# Patient Record
Sex: Female | Born: 1935
Health system: Southern US, Community
[De-identification: ages and names within clinical notes are randomized; demographics above are authoritative.]

## PROBLEM LIST (undated history)

## (undated) DIAGNOSIS — N390 Urinary tract infection, site not specified: Secondary | ICD-10-CM

## (undated) DIAGNOSIS — E559 Vitamin D deficiency, unspecified: Secondary | ICD-10-CM

## (undated) DIAGNOSIS — G40802 Other epilepsy, not intractable, without status epilepticus: Secondary | ICD-10-CM

## (undated) DIAGNOSIS — E1140 Type 2 diabetes mellitus with diabetic neuropathy, unspecified: Secondary | ICD-10-CM

## (undated) DIAGNOSIS — E7800 Pure hypercholesterolemia, unspecified: Secondary | ICD-10-CM

## (undated) DIAGNOSIS — H8109 Meniere's disease, unspecified ear: Secondary | ICD-10-CM

## (undated) DIAGNOSIS — C4490 Unspecified malignant neoplasm of skin, unspecified: Secondary | ICD-10-CM

## (undated) DIAGNOSIS — D649 Anemia, unspecified: Secondary | ICD-10-CM

## (undated) DIAGNOSIS — I10 Essential (primary) hypertension: Secondary | ICD-10-CM

## (undated) DIAGNOSIS — E119 Type 2 diabetes mellitus without complications: Secondary | ICD-10-CM

## (undated) DIAGNOSIS — M109 Gout, unspecified: Secondary | ICD-10-CM

## (undated) DIAGNOSIS — E669 Obesity, unspecified: Secondary | ICD-10-CM

## (undated) DIAGNOSIS — J189 Pneumonia, unspecified organism: Secondary | ICD-10-CM

## (undated) DIAGNOSIS — C4491 Basal cell carcinoma of skin, unspecified: Secondary | ICD-10-CM

## (undated) DIAGNOSIS — M1990 Unspecified osteoarthritis, unspecified site: Secondary | ICD-10-CM

## (undated) DIAGNOSIS — K219 Gastro-esophageal reflux disease without esophagitis: Secondary | ICD-10-CM

## (undated) DIAGNOSIS — E039 Hypothyroidism, unspecified: Secondary | ICD-10-CM

## (undated) DIAGNOSIS — N184 Chronic kidney disease, stage 4 (severe): Secondary | ICD-10-CM

## (undated) DIAGNOSIS — G40909 Epilepsy, unspecified, not intractable, without status epilepticus: Secondary | ICD-10-CM

## (undated) HISTORY — DX: Other epilepsy, not intractable, without status epilepticus: G40.802

## (undated) HISTORY — DX: Vitamin D deficiency, unspecified: E55.9

## (undated) HISTORY — PX: CERVICAL POLYPECTOMY: SHX88

## (undated) HISTORY — DX: Pure hypercholesterolemia, unspecified: E78.00

## (undated) HISTORY — DX: Essential (primary) hypertension: I10

## (undated) HISTORY — DX: Gout, unspecified: M10.9

## (undated) HISTORY — DX: Gastro-esophageal reflux disease without esophagitis: K21.9

## (undated) HISTORY — PX: TONSILLECTOMY: SUR1361

## (undated) HISTORY — DX: Hypothyroidism, unspecified: E03.9

## (undated) HISTORY — DX: Meniere's disease, unspecified ear: H81.09

## (undated) HISTORY — DX: Type 2 diabetes mellitus without complications: E11.9

## (undated) HISTORY — DX: Type 2 diabetes mellitus with diabetic neuropathy, unspecified: E11.40

## (undated) HISTORY — DX: Urinary tract infection, site not specified: N39.0

## (undated) HISTORY — DX: Anemia, unspecified: D64.9

## (undated) HISTORY — PX: WRIST SURGERY: SHX841

## (undated) HISTORY — PX: MOHS SURGERY: SHX181

## (undated) HISTORY — DX: Obesity, unspecified: E66.9

---

## 1978-10-20 HISTORY — PX: HAMMER TOE SURGERY: SHX385

## 1997-12-05 ENCOUNTER — Ambulatory Visit (HOSPITAL_COMMUNITY): Admission: RE | Admit: 1997-12-05 | Discharge: 1997-12-05 | Payer: Self-pay | Admitting: Internal Medicine

## 1998-12-08 ENCOUNTER — Ambulatory Visit (HOSPITAL_COMMUNITY): Admission: RE | Admit: 1998-12-08 | Discharge: 1998-12-08 | Payer: Self-pay | Admitting: Internal Medicine

## 1998-12-08 ENCOUNTER — Encounter: Payer: Self-pay | Admitting: Internal Medicine

## 1999-12-13 ENCOUNTER — Ambulatory Visit (HOSPITAL_COMMUNITY): Admission: RE | Admit: 1999-12-13 | Discharge: 1999-12-13 | Payer: Self-pay | Admitting: Internal Medicine

## 1999-12-13 ENCOUNTER — Encounter: Payer: Self-pay | Admitting: Internal Medicine

## 1999-12-26 ENCOUNTER — Encounter: Payer: Self-pay | Admitting: Internal Medicine

## 1999-12-26 ENCOUNTER — Ambulatory Visit (HOSPITAL_COMMUNITY): Admission: RE | Admit: 1999-12-26 | Discharge: 1999-12-26 | Payer: Self-pay | Admitting: Internal Medicine

## 2000-03-13 ENCOUNTER — Other Ambulatory Visit: Admission: RE | Admit: 2000-03-13 | Discharge: 2000-03-13 | Payer: Self-pay | Admitting: Internal Medicine

## 2000-03-13 ENCOUNTER — Encounter (INDEPENDENT_AMBULATORY_CARE_PROVIDER_SITE_OTHER): Payer: Self-pay | Admitting: Specialist

## 2000-12-15 ENCOUNTER — Encounter: Payer: Self-pay | Admitting: Internal Medicine

## 2000-12-15 ENCOUNTER — Ambulatory Visit (HOSPITAL_COMMUNITY): Admission: RE | Admit: 2000-12-15 | Discharge: 2000-12-15 | Payer: Self-pay | Admitting: Internal Medicine

## 2002-01-04 ENCOUNTER — Encounter: Payer: Self-pay | Admitting: Internal Medicine

## 2002-01-04 ENCOUNTER — Ambulatory Visit (HOSPITAL_COMMUNITY): Admission: RE | Admit: 2002-01-04 | Discharge: 2002-01-04 | Payer: Self-pay | Admitting: Internal Medicine

## 2002-02-09 ENCOUNTER — Ambulatory Visit (HOSPITAL_COMMUNITY): Admission: RE | Admit: 2002-02-09 | Discharge: 2002-02-09 | Payer: Self-pay | Admitting: Internal Medicine

## 2002-02-09 ENCOUNTER — Encounter (INDEPENDENT_AMBULATORY_CARE_PROVIDER_SITE_OTHER): Payer: Self-pay | Admitting: Specialist

## 2002-02-09 ENCOUNTER — Encounter (INDEPENDENT_AMBULATORY_CARE_PROVIDER_SITE_OTHER): Payer: Self-pay | Admitting: *Deleted

## 2002-02-09 ENCOUNTER — Encounter: Payer: Self-pay | Admitting: Internal Medicine

## 2003-01-19 ENCOUNTER — Ambulatory Visit (HOSPITAL_COMMUNITY): Admission: RE | Admit: 2003-01-19 | Discharge: 2003-01-19 | Payer: Self-pay | Admitting: Internal Medicine

## 2003-01-31 ENCOUNTER — Other Ambulatory Visit: Admission: RE | Admit: 2003-01-31 | Discharge: 2003-01-31 | Payer: Self-pay | Admitting: Internal Medicine

## 2003-08-08 ENCOUNTER — Ambulatory Visit (HOSPITAL_COMMUNITY): Admission: RE | Admit: 2003-08-08 | Discharge: 2003-08-08 | Payer: Self-pay | Admitting: Internal Medicine

## 2004-02-17 ENCOUNTER — Ambulatory Visit (HOSPITAL_COMMUNITY): Admission: RE | Admit: 2004-02-17 | Discharge: 2004-02-17 | Payer: Self-pay | Admitting: Internal Medicine

## 2005-01-18 ENCOUNTER — Ambulatory Visit: Payer: Self-pay | Admitting: Internal Medicine

## 2005-01-29 ENCOUNTER — Ambulatory Visit: Payer: Self-pay | Admitting: Internal Medicine

## 2005-01-29 ENCOUNTER — Encounter (INDEPENDENT_AMBULATORY_CARE_PROVIDER_SITE_OTHER): Payer: Self-pay | Admitting: *Deleted

## 2005-03-13 ENCOUNTER — Ambulatory Visit (HOSPITAL_COMMUNITY): Admission: RE | Admit: 2005-03-13 | Discharge: 2005-03-13 | Payer: Self-pay | Admitting: Internal Medicine

## 2006-03-14 ENCOUNTER — Ambulatory Visit (HOSPITAL_COMMUNITY): Admission: RE | Admit: 2006-03-14 | Discharge: 2006-03-14 | Payer: Self-pay | Admitting: Internal Medicine

## 2007-01-03 ENCOUNTER — Emergency Department (HOSPITAL_COMMUNITY): Admission: EM | Admit: 2007-01-03 | Discharge: 2007-01-03 | Payer: Self-pay | Admitting: Emergency Medicine

## 2007-03-17 ENCOUNTER — Ambulatory Visit (HOSPITAL_COMMUNITY): Admission: RE | Admit: 2007-03-17 | Discharge: 2007-03-17 | Payer: Self-pay | Admitting: Internal Medicine

## 2007-03-18 ENCOUNTER — Other Ambulatory Visit: Admission: RE | Admit: 2007-03-18 | Discharge: 2007-03-18 | Payer: Self-pay | Admitting: Internal Medicine

## 2007-10-09 ENCOUNTER — Encounter: Admission: RE | Admit: 2007-10-09 | Discharge: 2007-10-09 | Payer: Self-pay | Admitting: Otolaryngology

## 2008-02-02 ENCOUNTER — Encounter (INDEPENDENT_AMBULATORY_CARE_PROVIDER_SITE_OTHER): Payer: Self-pay | Admitting: Internal Medicine

## 2008-02-02 ENCOUNTER — Ambulatory Visit: Payer: Self-pay

## 2008-03-17 ENCOUNTER — Ambulatory Visit (HOSPITAL_COMMUNITY): Admission: RE | Admit: 2008-03-17 | Discharge: 2008-03-17 | Payer: Self-pay | Admitting: Internal Medicine

## 2008-05-26 ENCOUNTER — Ambulatory Visit: Payer: Self-pay | Admitting: Internal Medicine

## 2008-05-26 ENCOUNTER — Inpatient Hospital Stay (HOSPITAL_COMMUNITY): Admission: EM | Admit: 2008-05-26 | Discharge: 2008-05-29 | Payer: Self-pay | Admitting: Emergency Medicine

## 2008-05-26 ENCOUNTER — Encounter (INDEPENDENT_AMBULATORY_CARE_PROVIDER_SITE_OTHER): Payer: Self-pay | Admitting: *Deleted

## 2008-05-27 ENCOUNTER — Encounter (INDEPENDENT_AMBULATORY_CARE_PROVIDER_SITE_OTHER): Payer: Self-pay | Admitting: Internal Medicine

## 2008-05-27 ENCOUNTER — Encounter (INDEPENDENT_AMBULATORY_CARE_PROVIDER_SITE_OTHER): Payer: Self-pay | Admitting: *Deleted

## 2008-05-29 ENCOUNTER — Encounter (INDEPENDENT_AMBULATORY_CARE_PROVIDER_SITE_OTHER): Payer: Self-pay | Admitting: *Deleted

## 2008-06-07 ENCOUNTER — Telehealth: Payer: Self-pay | Admitting: Internal Medicine

## 2008-06-09 DIAGNOSIS — E039 Hypothyroidism, unspecified: Secondary | ICD-10-CM | POA: Insufficient documentation

## 2008-06-09 DIAGNOSIS — K219 Gastro-esophageal reflux disease without esophagitis: Secondary | ICD-10-CM | POA: Insufficient documentation

## 2008-06-09 DIAGNOSIS — E1122 Type 2 diabetes mellitus with diabetic chronic kidney disease: Secondary | ICD-10-CM | POA: Insufficient documentation

## 2008-06-09 DIAGNOSIS — I10 Essential (primary) hypertension: Secondary | ICD-10-CM | POA: Insufficient documentation

## 2008-06-09 DIAGNOSIS — I872 Venous insufficiency (chronic) (peripheral): Secondary | ICD-10-CM | POA: Insufficient documentation

## 2008-06-09 DIAGNOSIS — N184 Chronic kidney disease, stage 4 (severe): Secondary | ICD-10-CM | POA: Insufficient documentation

## 2008-06-09 DIAGNOSIS — D126 Benign neoplasm of colon, unspecified: Secondary | ICD-10-CM | POA: Insufficient documentation

## 2008-06-13 ENCOUNTER — Ambulatory Visit: Payer: Self-pay | Admitting: Internal Medicine

## 2008-06-13 DIAGNOSIS — D509 Iron deficiency anemia, unspecified: Secondary | ICD-10-CM

## 2008-06-13 DIAGNOSIS — D649 Anemia, unspecified: Secondary | ICD-10-CM | POA: Insufficient documentation

## 2008-07-07 ENCOUNTER — Telehealth: Payer: Self-pay | Admitting: Internal Medicine

## 2008-08-01 ENCOUNTER — Ambulatory Visit: Payer: Self-pay | Admitting: Internal Medicine

## 2008-08-01 ENCOUNTER — Encounter: Payer: Self-pay | Admitting: Internal Medicine

## 2008-08-01 LAB — HM COLONOSCOPY

## 2008-08-02 ENCOUNTER — Encounter: Payer: Self-pay | Admitting: Internal Medicine

## 2008-09-25 ENCOUNTER — Encounter: Payer: Self-pay | Admitting: Internal Medicine

## 2008-09-29 ENCOUNTER — Ambulatory Visit (HOSPITAL_COMMUNITY): Admission: RE | Admit: 2008-09-29 | Discharge: 2008-09-29 | Payer: Self-pay | Admitting: Internal Medicine

## 2008-11-07 ENCOUNTER — Telehealth: Payer: Self-pay | Admitting: Internal Medicine

## 2008-12-14 ENCOUNTER — Observation Stay (HOSPITAL_COMMUNITY): Admission: EM | Admit: 2008-12-14 | Discharge: 2008-12-15 | Payer: Self-pay | Admitting: Emergency Medicine

## 2008-12-26 ENCOUNTER — Encounter: Payer: Self-pay | Admitting: Internal Medicine

## 2009-01-23 ENCOUNTER — Ambulatory Visit: Payer: Self-pay | Admitting: Internal Medicine

## 2009-01-23 LAB — CONVERTED CEMR LAB
Ferritin: 61.1 ng/mL (ref 10.0–291.0)
Folate: 10.3 ng/mL
Iron: 86 ug/dL (ref 42–145)
Saturation Ratios: 18.8 % — ABNORMAL LOW (ref 20.0–50.0)
Transferrin: 326.8 mg/dL (ref 212.0–360.0)
Vitamin B-12: 774 pg/mL (ref 211–911)

## 2009-02-08 ENCOUNTER — Ambulatory Visit: Payer: Self-pay | Admitting: Oncology

## 2009-02-09 ENCOUNTER — Encounter: Payer: Self-pay | Admitting: Internal Medicine

## 2009-02-13 ENCOUNTER — Encounter: Payer: Self-pay | Admitting: Internal Medicine

## 2009-02-13 LAB — CBC & DIFF AND RETIC
BASO%: 0.3 % (ref 0.0–2.0)
Basophils Absolute: 0 10*3/uL (ref 0.0–0.1)
EOS%: 4.3 % (ref 0.0–7.0)
Eosinophils Absolute: 0.2 10*3/uL (ref 0.0–0.5)
HCT: 34.9 % (ref 34.8–46.6)
HGB: 11.3 g/dL — ABNORMAL LOW (ref 11.6–15.9)
Immature Retic Fract: 5.8 % (ref 0.00–10.70)
LYMPH%: 32.3 % (ref 14.0–49.7)
MCH: 29.3 pg (ref 25.1–34.0)
MCHC: 32.4 g/dL (ref 31.5–36.0)
MCV: 90.4 fL (ref 79.5–101.0)
MONO#: 0.3 10*3/uL (ref 0.1–0.9)
MONO%: 9.2 % (ref 0.0–14.0)
NEUT#: 1.9 10*3/uL (ref 1.5–6.5)
NEUT%: 53.9 % (ref 38.4–76.8)
Platelets: 178 10*3/uL (ref 145–400)
RBC: 3.86 10*6/uL (ref 3.70–5.45)
RDW: 13.4 % (ref 11.2–14.5)
Retic %: 1.21 % (ref 0.50–1.50)
Retic Ct Abs: 46.71 10*3/uL (ref 18.30–72.70)
WBC: 3.5 10*3/uL — ABNORMAL LOW (ref 3.9–10.3)
lymph#: 1.1 10*3/uL (ref 0.9–3.3)

## 2009-02-13 LAB — MORPHOLOGY: PLT EST: ADEQUATE

## 2009-02-15 LAB — IRON AND TIBC
%SAT: 22 % (ref 20–55)
Iron: 85 ug/dL (ref 42–145)
TIBC: 380 ug/dL (ref 250–470)
UIBC: 295 ug/dL

## 2009-02-15 LAB — COMPREHENSIVE METABOLIC PANEL
ALT: 30 U/L (ref 0–35)
AST: 20 U/L (ref 0–37)
Albumin: 4.2 g/dL (ref 3.5–5.2)
Alkaline Phosphatase: 34 U/L — ABNORMAL LOW (ref 39–117)
BUN: 24 mg/dL — ABNORMAL HIGH (ref 6–23)
CO2: 20 mEq/L (ref 19–32)
Calcium: 10 mg/dL (ref 8.4–10.5)
Chloride: 106 mEq/L (ref 96–112)
Creatinine, Ser: 1.94 mg/dL — ABNORMAL HIGH (ref 0.40–1.20)
Glucose, Bld: 231 mg/dL — ABNORMAL HIGH (ref 70–99)
Potassium: 4.3 mEq/L (ref 3.5–5.3)
Sodium: 140 mEq/L (ref 135–145)
Total Bilirubin: 0.3 mg/dL (ref 0.3–1.2)
Total Protein: 6.4 g/dL (ref 6.0–8.3)

## 2009-02-15 LAB — PROTEIN ELECTROPHORESIS, SERUM
Albumin ELP: 63.7 % (ref 55.8–66.1)
Alpha-1-Globulin: 4.5 % (ref 2.9–4.9)
Alpha-2-Globulin: 9.6 % (ref 7.1–11.8)
Beta 2: 4.5 % (ref 3.2–6.5)
Beta Globulin: 7.2 % (ref 4.7–7.2)
Gamma Globulin: 10.5 % — ABNORMAL LOW (ref 11.1–18.8)
Total Protein, Serum Electrophoresis: 6.4 g/dL (ref 6.0–8.3)

## 2009-02-15 LAB — ERYTHROPOIETIN: Erythropoietin: 18.5 m[IU]/mL (ref 2.6–34.0)

## 2009-02-15 LAB — VITAMIN B12: Vitamin B-12: 620 pg/mL (ref 211–911)

## 2009-02-15 LAB — FOLATE: Folate: 16.4 ng/mL

## 2009-02-15 LAB — HAPTOGLOBIN: Haptoglobin: 71 mg/dL (ref 16–200)

## 2009-02-15 LAB — FERRITIN: Ferritin: 68 ng/mL (ref 10–291)

## 2009-02-15 LAB — DIRECT ANTIGLOBULIN TEST (NOT AT ARMC)
DAT (Complement): NEGATIVE
DAT IgG: NEGATIVE

## 2009-02-27 ENCOUNTER — Encounter: Payer: Self-pay | Admitting: Internal Medicine

## 2009-02-27 LAB — CBC WITH DIFFERENTIAL/PLATELET
BASO%: 0.4 % (ref 0.0–2.0)
Basophils Absolute: 0 10*3/uL (ref 0.0–0.1)
EOS%: 4.5 % (ref 0.0–7.0)
Eosinophils Absolute: 0.2 10*3/uL (ref 0.0–0.5)
HCT: 37.4 % (ref 34.8–46.6)
HGB: 12.3 g/dL (ref 11.6–15.9)
LYMPH%: 27.1 % (ref 14.0–49.7)
MCH: 29.6 pg (ref 25.1–34.0)
MCHC: 32.9 g/dL (ref 31.5–36.0)
MCV: 89.9 fL (ref 79.5–101.0)
MONO#: 0.5 10*3/uL (ref 0.1–0.9)
MONO%: 8.9 % (ref 0.0–14.0)
NEUT#: 3.1 10*3/uL (ref 1.5–6.5)
NEUT%: 59.1 % (ref 38.4–76.8)
Platelets: 218 10*3/uL (ref 145–400)
RBC: 4.16 10*6/uL (ref 3.70–5.45)
RDW: 13.4 % (ref 11.2–14.5)
WBC: 5.3 10*3/uL (ref 3.9–10.3)
lymph#: 1.4 10*3/uL (ref 0.9–3.3)

## 2009-03-10 ENCOUNTER — Telehealth: Payer: Self-pay | Admitting: Internal Medicine

## 2009-03-17 ENCOUNTER — Ambulatory Visit: Payer: Self-pay | Admitting: Oncology

## 2009-04-27 ENCOUNTER — Telehealth: Payer: Self-pay | Admitting: Internal Medicine

## 2009-05-09 ENCOUNTER — Ambulatory Visit (HOSPITAL_COMMUNITY): Admission: RE | Admit: 2009-05-09 | Discharge: 2009-05-09 | Payer: Self-pay | Admitting: Internal Medicine

## 2009-09-14 ENCOUNTER — Ambulatory Visit: Payer: Self-pay | Admitting: Oncology

## 2009-09-18 LAB — CBC WITH DIFFERENTIAL/PLATELET
BASO%: 0.6 % (ref 0.0–2.0)
Basophils Absolute: 0 10*3/uL (ref 0.0–0.1)
EOS%: 6.3 % (ref 0.0–7.0)
Eosinophils Absolute: 0.3 10*3/uL (ref 0.0–0.5)
HCT: 36.6 % (ref 34.8–46.6)
HGB: 12.6 g/dL (ref 11.6–15.9)
LYMPH%: 26.9 % (ref 14.0–49.7)
MCH: 31 pg (ref 25.1–34.0)
MCHC: 34.5 g/dL (ref 31.5–36.0)
MCV: 89.9 fL (ref 79.5–101.0)
MONO#: 0.5 10*3/uL (ref 0.1–0.9)
MONO%: 10.1 % (ref 0.0–14.0)
NEUT#: 2.6 10*3/uL (ref 1.5–6.5)
NEUT%: 56.1 % (ref 38.4–76.8)
Platelets: 219 10*3/uL (ref 145–400)
RBC: 4.07 10*6/uL (ref 3.70–5.45)
RDW: 14.2 % (ref 11.2–14.5)
WBC: 4.6 10*3/uL (ref 3.9–10.3)
lymph#: 1.2 10*3/uL (ref 0.9–3.3)

## 2009-09-18 LAB — IRON AND TIBC
%SAT: 29 % (ref 20–55)
Iron: 113 ug/dL (ref 42–145)
TIBC: 396 ug/dL (ref 250–470)
UIBC: 283 ug/dL

## 2009-09-18 LAB — FERRITIN: Ferritin: 114 ng/mL (ref 10–291)

## 2009-09-25 LAB — CBC & DIFF AND RETIC
BASO%: 0.7 % (ref 0.0–2.0)
Basophils Absolute: 0 10*3/uL (ref 0.0–0.1)
EOS%: 6.5 % (ref 0.0–7.0)
Eosinophils Absolute: 0.3 10*3/uL (ref 0.0–0.5)
HCT: 37.6 % (ref 34.8–46.6)
HGB: 12.2 g/dL (ref 11.6–15.9)
Immature Retic Fract: 6.1 % (ref 0.00–10.70)
LYMPH%: 30.7 % (ref 14.0–49.7)
MCH: 29.5 pg (ref 25.1–34.0)
MCHC: 32.4 g/dL (ref 31.5–36.0)
MCV: 91 fL (ref 79.5–101.0)
MONO#: 0.3 10*3/uL (ref 0.1–0.9)
MONO%: 7.9 % (ref 0.0–14.0)
NEUT#: 2.3 10*3/uL (ref 1.5–6.5)
NEUT%: 54.2 % (ref 38.4–76.8)
Platelets: 189 10*3/uL (ref 145–400)
RBC: 4.13 10*6/uL (ref 3.70–5.45)
RDW: 13.8 % (ref 11.2–14.5)
Retic %: 1.49 % (ref 0.50–1.50)
Retic Ct Abs: 61.54 10*3/uL (ref 18.30–72.70)
WBC: 4.3 10*3/uL (ref 3.9–10.3)
lymph#: 1.3 10*3/uL (ref 0.9–3.3)
nRBC: 0 % (ref 0–0)

## 2009-09-25 LAB — IRON AND TIBC
%SAT: 20 % (ref 20–55)
Iron: 72 ug/dL (ref 42–145)
TIBC: 366 ug/dL (ref 250–470)
UIBC: 294 ug/dL

## 2009-09-25 LAB — FERRITIN: Ferritin: 119 ng/mL (ref 10–291)

## 2010-03-11 ENCOUNTER — Encounter: Payer: Self-pay | Admitting: *Deleted

## 2010-03-20 NOTE — Consult Note (Signed)
Summary: Letter & OV notes/[facility]   Imported By: Bubba Hales 03/08/2009 07:31:04  _____________________________________________________________________  External Attachment:    Type:   Image     Comment:   External Document

## 2010-03-20 NOTE — Progress Notes (Signed)
Summary: [place] Referral follow-up/Dr. Humphrey Rolls  Phone Note Outgoing Call   Call placed by: Randye Lobo NCMA,  April 27, 2009 9:29 AM Call placed to: Specialist Summary of Call: [facility] in [place] to follow-up with referral appt. with Dr. Humphrey Rolls and patient did go for her appt.  They will be faxing records for Dr. Henrene Pastor to review.   Initial call taken by: Randye Lobo NCMA,  April 27, 2009 9:30 AM

## 2010-03-20 NOTE — Letter (Signed)
Summary: [facility] [place]   Imported By: Phillis Knack 10/11/2009 13:33:11  _____________________________________________________________________  External Attachment:    Type:   Image     Comment:   External Document

## 2010-03-20 NOTE — Letter (Signed)
Summary: [place]   Imported By: Phillis Knack 03/23/2009 07:22:22  _____________________________________________________________________  External Attachment:    Type:   Image     Comment:   External Document

## 2010-03-20 NOTE — Miscellaneous (Signed)
Summary: Referral to Hematology  Clinical Lists Changes  Orders: Added new Test order of [facility]) - Signed

## 2010-03-20 NOTE — Progress Notes (Signed)
  Phone Note Outgoing Call   Call placed by: Randye Lobo NCMA,  March 10, 2009 4:36 PM Call placed to: Specialist Summary of Call: [facility] attn: Renee and left message for her to call. I wanted to know if patient did go to appt. with Dr. Humphrey Rolls on 12/27 and if so, if we could get notes. Initial call taken by: Randye Lobo [place],  March 10, 2009 4:37 PM  Follow-up for Phone Call        called Renee at cancer center no record of referral appt.  Also called [facility] Dr. Humphrey Rolls and no record of appt. or referral.  Not sure what happened but it is in my records the appt. was 02/13/10.  I am resending information to Dr. Laurelyn Sickle office at [facility] for them to make an appt.   Follow-up by: Randye Lobo NCMA,  March 15, 2009 11:07 AM     Appended Document: Orders Update-referral to Dr. Humphrey Rolls    Clinical Lists Changes  Orders: Added new Test order of [facility]) - Signed      Appended Document:  appt. 03/22/09 1:00 pm with Dr. Humphrey Rolls.  Patient notified of appt. by Kalman Shan at Dr. Laurelyn Sickle office.

## 2010-05-21 ENCOUNTER — Other Ambulatory Visit (HOSPITAL_COMMUNITY): Payer: Self-pay | Admitting: Internal Medicine

## 2010-05-21 DIAGNOSIS — Z1231 Encounter for screening mammogram for malignant neoplasm of breast: Secondary | ICD-10-CM

## 2010-05-24 LAB — CBC
HCT: 33.2 % — ABNORMAL LOW (ref 36.0–46.0)
Hemoglobin: 11.6 g/dL — ABNORMAL LOW (ref 12.0–15.0)
MCHC: 35 g/dL (ref 30.0–36.0)
MCV: 89.3 fL (ref 78.0–100.0)
Platelets: 197 10*3/uL (ref 150–400)
RBC: 3.72 MIL/uL — ABNORMAL LOW (ref 3.87–5.11)
RDW: 14.1 % (ref 11.5–15.5)
WBC: 6.7 10*3/uL (ref 4.0–10.5)

## 2010-05-24 LAB — BASIC METABOLIC PANEL
BUN: 23 mg/dL (ref 6–23)
BUN: 24 mg/dL — ABNORMAL HIGH (ref 6–23)
CO2: 25 mEq/L (ref 19–32)
CO2: 25 mEq/L (ref 19–32)
Calcium: 10 mg/dL (ref 8.4–10.5)
Calcium: 10.5 mg/dL (ref 8.4–10.5)
Chloride: 105 mEq/L (ref 96–112)
Chloride: 107 mEq/L (ref 96–112)
Creatinine, Ser: 1.73 mg/dL — ABNORMAL HIGH (ref 0.4–1.2)
Creatinine, Ser: 1.96 mg/dL — ABNORMAL HIGH (ref 0.4–1.2)
GFR calc Af Amer: 30 mL/min — ABNORMAL LOW (ref >60)
GFR calc Af Amer: 35 mL/min — ABNORMAL LOW (ref >60)
GFR calc non Af Amer: 25 mL/min — ABNORMAL LOW (ref >60)
GFR calc non Af Amer: 29 mL/min — ABNORMAL LOW (ref >60)
Glucose, Bld: 133 mg/dL — ABNORMAL HIGH (ref 70–99)
Glucose, Bld: 133 mg/dL — ABNORMAL HIGH (ref 70–99)
Potassium: 3.6 mEq/L (ref 3.5–5.1)
Potassium: 3.9 mEq/L (ref 3.5–5.1)
Sodium: 137 mEq/L (ref 135–145)
Sodium: 138 mEq/L (ref 135–145)

## 2010-05-24 LAB — URINALYSIS, ROUTINE W REFLEX MICROSCOPIC
Bilirubin Urine: NEGATIVE
Glucose, UA: NEGATIVE mg/dL
Hgb urine dipstick: NEGATIVE
Ketones, ur: NEGATIVE mg/dL
Nitrite: NEGATIVE
Protein, ur: NEGATIVE mg/dL
Specific Gravity, Urine: 1.015 (ref 1.005–1.030)
Urobilinogen, UA: 0.2 mg/dL (ref 0.0–1.0)
pH: 6.5 (ref 5.0–8.0)

## 2010-05-24 LAB — DIFFERENTIAL
Basophils Absolute: 0 10*3/uL (ref 0.0–0.1)
Basophils Relative: 0 % (ref 0–1)
Eosinophils Absolute: 0.2 10*3/uL (ref 0.0–0.7)
Eosinophils Relative: 3 % (ref 0–5)
Lymphocytes Relative: 15 % (ref 12–46)
Lymphs Abs: 1 10*3/uL (ref 0.7–4.0)
Monocytes Absolute: 0.6 10*3/uL (ref 0.1–1.0)
Monocytes Relative: 8 % (ref 3–12)
Neutro Abs: 4.9 10*3/uL (ref 1.7–7.7)
Neutrophils Relative %: 74 % (ref 43–77)

## 2010-05-24 LAB — URINE MICROSCOPIC-ADD ON

## 2010-05-28 ENCOUNTER — Ambulatory Visit (HOSPITAL_COMMUNITY)
Admission: RE | Admit: 2010-05-28 | Discharge: 2010-05-28 | Disposition: A | Discharge status: Home / Self Care | Payer: MEDICARE | Source: Ambulatory Visit | Attending: Internal Medicine | Admitting: Internal Medicine

## 2010-05-28 DIAGNOSIS — Z1231 Encounter for screening mammogram for malignant neoplasm of breast: Secondary | ICD-10-CM

## 2010-05-28 LAB — GLUCOSE, CAPILLARY
Glucose-Capillary: 128 mg/dL — ABNORMAL HIGH (ref 70–99)
Glucose-Capillary: 135 mg/dL — ABNORMAL HIGH (ref 70–99)

## 2010-05-30 LAB — GLUCOSE, CAPILLARY
Glucose-Capillary: 107 mg/dL — ABNORMAL HIGH (ref 70–99)
Glucose-Capillary: 117 mg/dL — ABNORMAL HIGH (ref 70–99)
Glucose-Capillary: 120 mg/dL — ABNORMAL HIGH (ref 70–99)
Glucose-Capillary: 120 mg/dL — ABNORMAL HIGH (ref 70–99)
Glucose-Capillary: 120 mg/dL — ABNORMAL HIGH (ref 70–99)
Glucose-Capillary: 131 mg/dL — ABNORMAL HIGH (ref 70–99)
Glucose-Capillary: 137 mg/dL — ABNORMAL HIGH (ref 70–99)
Glucose-Capillary: 140 mg/dL — ABNORMAL HIGH (ref 70–99)
Glucose-Capillary: 148 mg/dL — ABNORMAL HIGH (ref 70–99)
Glucose-Capillary: 158 mg/dL — ABNORMAL HIGH (ref 70–99)

## 2010-05-30 LAB — BASIC METABOLIC PANEL
BUN: 13 mg/dL (ref 6–23)
BUN: 16 mg/dL (ref 6–23)
CO2: 23 mEq/L (ref 19–32)
CO2: 23 mEq/L (ref 19–32)
Calcium: 9.2 mg/dL (ref 8.4–10.5)
Calcium: 9.5 mg/dL (ref 8.4–10.5)
Chloride: 113 mEq/L — ABNORMAL HIGH (ref 96–112)
Chloride: 113 mEq/L — ABNORMAL HIGH (ref 96–112)
Creatinine, Ser: 1.73 mg/dL — ABNORMAL HIGH (ref 0.4–1.2)
Creatinine, Ser: 1.86 mg/dL — ABNORMAL HIGH (ref 0.4–1.2)
GFR calc Af Amer: 32 mL/min — ABNORMAL LOW (ref >60)
GFR calc Af Amer: 35 mL/min — ABNORMAL LOW (ref >60)
GFR calc non Af Amer: 27 mL/min — ABNORMAL LOW (ref >60)
GFR calc non Af Amer: 29 mL/min — ABNORMAL LOW (ref >60)
Glucose, Bld: 121 mg/dL — ABNORMAL HIGH (ref 70–99)
Glucose, Bld: 122 mg/dL — ABNORMAL HIGH (ref 70–99)
Potassium: 4.1 mEq/L (ref 3.5–5.1)
Potassium: 4.3 mEq/L (ref 3.5–5.1)
Sodium: 141 mEq/L (ref 135–145)
Sodium: 142 mEq/L (ref 135–145)

## 2010-05-30 LAB — VITAMIN B12: Vitamin B-12: 290 pg/mL (ref 211–911)

## 2010-05-30 LAB — CBC
HCT: 25.9 % — ABNORMAL LOW (ref 36.0–46.0)
HCT: 27 % — ABNORMAL LOW (ref 36.0–46.0)
HCT: 27.5 % — ABNORMAL LOW (ref 36.0–46.0)
HCT: 29.8 % — ABNORMAL LOW (ref 36.0–46.0)
Hemoglobin: 10.1 g/dL — ABNORMAL LOW (ref 12.0–15.0)
Hemoglobin: 8.6 g/dL — ABNORMAL LOW (ref 12.0–15.0)
Hemoglobin: 9.1 g/dL — ABNORMAL LOW (ref 12.0–15.0)
Hemoglobin: 9.2 g/dL — ABNORMAL LOW (ref 12.0–15.0)
MCHC: 33.3 g/dL (ref 30.0–36.0)
MCHC: 33.4 g/dL (ref 30.0–36.0)
MCHC: 33.8 g/dL (ref 30.0–36.0)
MCHC: 33.9 g/dL (ref 30.0–36.0)
MCV: 85.9 fL (ref 78.0–100.0)
MCV: 85.9 fL (ref 78.0–100.0)
MCV: 86.5 fL (ref 78.0–100.0)
MCV: 86.9 fL (ref 78.0–100.0)
Platelets: 172 10*3/uL (ref 150–400)
Platelets: 178 10*3/uL (ref 150–400)
Platelets: 191 10*3/uL (ref 150–400)
Platelets: 205 10*3/uL (ref 150–400)
RBC: 2.98 MIL/uL — ABNORMAL LOW (ref 3.87–5.11)
RBC: 3.14 MIL/uL — ABNORMAL LOW (ref 3.87–5.11)
RBC: 3.18 MIL/uL — ABNORMAL LOW (ref 3.87–5.11)
RBC: 3.47 MIL/uL — ABNORMAL LOW (ref 3.87–5.11)
RDW: 15.6 % — ABNORMAL HIGH (ref 11.5–15.5)
RDW: 15.6 % — ABNORMAL HIGH (ref 11.5–15.5)
RDW: 15.7 % — ABNORMAL HIGH (ref 11.5–15.5)
RDW: 16.1 % — ABNORMAL HIGH (ref 11.5–15.5)
WBC: 3.1 10*3/uL — ABNORMAL LOW (ref 4.0–10.5)
WBC: 3.3 10*3/uL — ABNORMAL LOW (ref 4.0–10.5)
WBC: 3.5 10*3/uL — ABNORMAL LOW (ref 4.0–10.5)
WBC: 3.7 10*3/uL — ABNORMAL LOW (ref 4.0–10.5)

## 2010-05-30 LAB — CK TOTAL AND CKMB (NOT AT ARMC)
CK, MB: 1.4 ng/mL (ref 0.3–4.0)
CK, MB: 1.5 ng/mL (ref 0.3–4.0)
CK, MB: 2.4 ng/mL (ref 0.3–4.0)
Relative Index: INVALID (ref 0.0–2.5)
Relative Index: INVALID (ref 0.0–2.5)
Relative Index: INVALID (ref 0.0–2.5)
Total CK: 22 U/L (ref 7–177)
Total CK: 24 U/L (ref 7–177)
Total CK: 37 U/L (ref 7–177)

## 2010-05-30 LAB — RETICULOCYTES
RBC.: 3.31 MIL/uL — ABNORMAL LOW (ref 3.87–5.11)
Retic Count, Absolute: 49.7 10*3/uL (ref 19.0–186.0)
Retic Ct Pct: 1.5 % (ref 0.4–3.1)

## 2010-05-30 LAB — POCT I-STAT, CHEM 8
BUN: 18 mg/dL (ref 6–23)
Calcium, Ion: 1.29 mmol/L (ref 1.12–1.32)
Chloride: 112 mEq/L (ref 96–112)
Creatinine, Ser: 2.1 mg/dL — ABNORMAL HIGH (ref 0.4–1.2)
Glucose, Bld: 96 mg/dL (ref 70–99)
HCT: 32 % — ABNORMAL LOW (ref 36.0–46.0)
Hemoglobin: 10.9 g/dL — ABNORMAL LOW (ref 12.0–15.0)
Potassium: 4.4 mEq/L (ref 3.5–5.1)
Sodium: 142 mEq/L (ref 135–145)
TCO2: 21 mmol/L (ref 0–100)

## 2010-05-30 LAB — TROPONIN I
Troponin I: <0.01 ng/mL (ref 0.00–0.06)
Troponin I: <0.01 ng/mL (ref 0.00–0.06)
Troponin I: <0.01 ng/mL (ref 0.00–0.06)

## 2010-05-30 LAB — URINALYSIS, ROUTINE W REFLEX MICROSCOPIC
Bilirubin Urine: NEGATIVE
Glucose, UA: NEGATIVE mg/dL
Ketones, ur: NEGATIVE mg/dL
Nitrite: NEGATIVE
Protein, ur: NEGATIVE mg/dL
Specific Gravity, Urine: 1.007 (ref 1.005–1.030)
Urobilinogen, UA: 0.2 mg/dL (ref 0.0–1.0)
pH: 5.5 (ref 5.0–8.0)

## 2010-05-30 LAB — URINE MICROSCOPIC-ADD ON

## 2010-05-30 LAB — FOLATE: Folate: 8.2 ng/mL

## 2010-05-30 LAB — FERRITIN: Ferritin: 67 ng/mL (ref 10–291)

## 2010-05-30 LAB — MAGNESIUM
Magnesium: 2.1 mg/dL (ref 1.5–2.5)
Magnesium: 2.3 mg/dL (ref 1.5–2.5)

## 2010-05-30 LAB — LIPID PANEL
Cholesterol: 117 mg/dL (ref 0–200)
HDL: 26 mg/dL — ABNORMAL LOW (ref >39)
LDL Cholesterol: 66 mg/dL (ref 0–99)
Total CHOL/HDL Ratio: 4.5 RATIO
Triglycerides: 123 mg/dL (ref <150)
VLDL: 25 mg/dL (ref 0–40)

## 2010-05-30 LAB — URINE CULTURE
Colony Count: 15000
Colony Count: 60000

## 2010-05-30 LAB — DIFFERENTIAL
Basophils Absolute: 0 10*3/uL (ref 0.0–0.1)
Basophils Relative: 1 % (ref 0–1)
Eosinophils Absolute: 0.2 10*3/uL (ref 0.0–0.7)
Eosinophils Relative: 5 % (ref 0–5)
Lymphocytes Relative: 32 % (ref 12–46)
Lymphs Abs: 1.1 10*3/uL (ref 0.7–4.0)
Monocytes Absolute: 0.3 10*3/uL (ref 0.1–1.0)
Monocytes Relative: 9 % (ref 3–12)
Neutro Abs: 1.8 10*3/uL (ref 1.7–7.7)
Neutrophils Relative %: 53 % (ref 43–77)

## 2010-05-30 LAB — HEMOGLOBIN A1C
Hgb A1c MFr Bld: 6.6 % — ABNORMAL HIGH (ref 4.6–6.1)
Mean Plasma Glucose: 143 mg/dL

## 2010-05-30 LAB — IRON AND TIBC
Iron: 41 ug/dL — ABNORMAL LOW (ref 42–135)
Saturation Ratios: 11 % — ABNORMAL LOW (ref 20–55)
TIBC: 363 ug/dL (ref 250–470)
UIBC: 322 ug/dL

## 2010-05-30 LAB — TSH: TSH: 3.651 u[IU]/mL (ref 0.350–4.500)

## 2010-07-03 NOTE — Discharge Summary (Signed)
[name] [name], [name] [name] NO.:  [id number]   MEDICAL RECORD NO.:  [id number]          PATIENT TYPE:  INP   LOCATION:  [id number]                         FACILITY:  [facility]   PHYSICIAN:  Cherene Altes, M.D.DATE OF BIRTH:  Aug 17, 1932   DATE OF ADMISSION:  05/26/2008  DATE OF DISCHARGE:  05/29/2008                               DISCHARGE SUMMARY   PRIMARY CARE PHYSICIAN:  Unk Pinto, M.D. over on [facility].   DISCHARGE DIAGNOSES:  1. Presyncope/dizziness.      a.     Full anatomic workup unrevealing.      b.     Clinically dehydrated.      c.     Recently discontinued Lasix therapy.      d.     Resolved with hydration.  2. Newly diagnosed B12 deficiency.      a.     B12 level 290.      b.     Three 1000 mcg subcu injections given during the hospital       stay.      c.     A trial of outpatient oral B12 initiated.  3. Hypertension - controlled.  4. Diabetes mellitus - controlled.  5. Hypothyroidism - stable.  6. Apparent history of seizure disorder - no seizure x2+ years.  7. Status post resection of basal cell carcinoma.  8. Iron deficiency anemia, outpatient workup recommended.   DISCHARGE MEDICATIONS:  1. Vitamin B12 1000 mcg p.o. daily.  2. Aspirin 81 mg p.o. daily.  3. Synthroid 100 mcg p.o. daily.  4. Prevacid 30 mg p.o. daily.  5. Atenolol 50 mg p.o. daily.  6. Multivitamin 1 tablet p.o. daily.  7. Vitamin C 500 mg t.i.d.  8. Lipitor 20 mg p.o. q. Tuesday.  9. Loratadine 10 mg p.o. daily.  10.Metformin 500 mg q. breakfast, 500 mg q. lunch and 1000 mg q.h.s.  11.Vitamin D 10,000 international units daily.  12.Xanax 0.25 mg p.o. t.i.d.  13.Keppra 500 mg p.o. b.i.d.  14.TriCor 130 mg p.o. daily.  15.Magnesium 250 mg p.o. daily.   FOLLOWUP:  The patient is advised to follow up with Dr. Melford Aase as is  previously scheduled within 1 week's time.  At followup a scheduling of  future reassessment of B12 level should be considered.  Additionally  recheck of CBC should be carried out with the patient having a discharge  hemoglobin of 8.6.  reassessment of the patient's renal function is also  recommended at the time of followup with a discharge creatinine of 1.86.   CONSULTATIONS:  None.   PROCEDURE:  1. MRI of the brain May 27, 2008, no acute intracranial abnormality      or significant interval changes.  2. Renal ultrasound May 27, 2008, no evidence for hydronephrosis.  3. Transthoracic echocardiogram April 9, [id number], LV systolic function      normal.  Ejection fraction of 60-65%.  LV wall thickness mildly      increased.   HOSPITAL COURSE:  Ms. [name] [name] is a very pleasant 77 year old female  who presented to the hospital on  May 26, 2008, after she suffered a  presyncopal spell.  The patient stated that she had been in her usual  state of health.  She was up attempting to move from one room to another  in her room when she suddenly fell.  She felt severely dizzy at the  time.  There was no associated chest pain or shortness of breath.  There  was no incontinence of urine or seizure like activity or tongue biting.  The patient did not actually lose consciousness.  The patient was  admitted to the acute units.  She was found to be significantly  dehydrated clinically and suffering with acute renal insufficiency with  a presenting creatinine of 2.1.  The patient described orthostatic type  symptoms.  She did report that she had recently discontinued Lasix at  the behest of her primary care physician, Dr. Melford Aase.  Full near  syncopal evaluation was carried out including MRI and transthoracic  echocardiogram.  These were both entirely unrevealing.  With simple  hydration the patient's symptoms were improved markedly.  She was able  to ambulate up and down the hall without difficulty.  Workup also  included an anemia panel.  This revealed significant B12 deficiency with  a B12 level well below 400.  Empiric B12 therapy was  initiated subcu at  1000 mcg daily for a minimum of 3 days.  The patient is now being  transitioned over to oral B12 for ongoing outpatient use.  Reassessment  of her B12 level will be needed in the outpatient setting.  Furthermore,  iron deficiency was appreciated during her hospital stay with an iron of  41 and a percent saturation of 11 despite a normal total iron-binding  capacity.  As a result it is recommended that the patient receive  outpatient screening GI evaluation for the possibility of an occult GI  blood loss.  Stool guaiacs were requested during this hospital stay but  were not able to be accomplished for reasons that are not clear.   On May 29, 2008, the patient was deemed to be stable.  Vital signs  were stable and she was afebrile.  Hemoglobin was 8.6 with creatinine of  1.86 and potassium of 4.3.  The patient was ambulated in the hallway  without difficulty and was therefore cleared for discharge home.  Followup is as recommended above.      Cherene Altes, M.D.  Electronically Signed     JTM/MEDQ  D:  05/29/2008  T:  05/29/2008  Job:  [id number]   cc:   Unk Pinto, M.D.

## 2010-07-03 NOTE — H&P (Signed)
[name] [name], [name] [name] NO.:  [id number]   MEDICAL RECORD NO.:  [id number]          PATIENT TYPE:  INP   LOCATION:  1832                         FACILITY:  [facility]   PHYSICIAN:  Rise Patience, MDDATE OF BIRTH:  May 20, 1932   DATE OF ADMISSION:  05/26/2008  DATE OF DISCHARGE:                              HISTORY & PHYSICAL   PRIMARY CARE PHYSICIAN:  Unk Pinto, M.D.   CHIEF COMPLAINT:  Dizziness.   HISTORY OF PRESENT ILLNESS:  A 77 year old female with known history of  hypertension, diabetes mellitus type 2, hypothyroidism.  The patient has  been seizure free for the last 2 years.  Presently complains of almost  passing out.  The patient states that she was feeling well generally and  was trying to walk from one room to another room, when she suddenly fell  -- which was not from a slip or did not trip on anything.  She did not  have any associated chest pain, shortness of breath; did not have any  incontinence of urine or tongue bite.  Denies any aphasia-like  activities.  The patient did not lose consciousness and she was upset.  She pressed the alarm button and she was brought to the ER.   The patient presently denies any chest pain.  She does not have any  headache, nausea or vomiting, diarrhea, fevers or chills.  As such, she  was admitted to further workup.   PAST MEDICAL HISTORY:  1. Hypertension.  2. Aphasia disorder.  3. Diabetes mellitus type 2.  4. History of basal cell carcinoma, status post surgery.  5. She has been seizure free for 2 years.  6. Hyperthyroidism.   PAST SURGICAL HISTORY:  Surgery for basal cell carcinoma.   MEDICATIONS PRIOR TO ADMISSION:  1. Aspirin 81 mg p.o. daily.  2. Synthroid 100 mcg p.o. daily.  3. Prevacid 30 mg p.o. daily.  4. Actonel 15 mg p.o. daily.  5. Multivitamin one tablet p.o. daily.  6. Vitamin C 500 mg p.o. three times daily.  7. Flax seed 1000 mg p.o. t.i.d.  8. Wellbutrin 20 mg p.o. on  Tuesdays.  9. Clonazepam 20 mg p.o. daily.  10.Metformin 500 mg tablet; takes one a breakfast, one at lunch and      two at dinner.  11.Vitamin D 10,000 units p.o. daily.  12.__________ 0.25 mg p.o. daily.  13.Keppra 500 mg p.o. daily.  14.__________ 35 mg p.o. daily.  15.__________ p.o. daily.   ALLERGIES:  No known drug allergies.   FAMILY HISTORY:  Nothing contributory.   SOCIAL HISTORY:  The patient quit smoking 20 years ago.  Denies any  alcohol or drug abuse.   REVIEW OF SYSTEMS:  As per history of present illness, nothing else  significant.   PHYSICAL EXAMINATION:  The patient examined at bedside, not in acute  distress.  VITAL SIGNS:  Blood pressure 124/56, pulse 90 per minute, temperature  97.1, respirations 18, O2 saturations 94%.  HEENT:  Anicteric.  No pallor.  No facial asymmetry.  Tongue is midline.  TL is positive.  No nasal deviation  nor obvious head injury.  CHEST:  Bilateral air entry present.  No rhonchi, no crepitation.  HEART:  S1 and S2 heard.  ABDOMEN:  Soft and nontender.  Bowel sounds heard.  CNS:  Alert and oriented to time, place and person.  EXTREMITIES:  __________ Peripheral pulses felt.  No edema.   LABS:  EKG normal sinus rhythm, with no acute ST-wave changes.  Chest x-  ray nothing acute, only bronchitic changes.  CBC:  WBC 3.5, hemoglobin  10.9, hematocrit 32, platelets 205, neutrophils 53%.  Basic Metabolic  Panel:  Sodium A999333, potassium 4.4, chloride 112, glucose 96, BUN 18,  creatinine 2.1.  Creatinine kinase 37.  Troponin-I less than 0.01.  UA  shows wbc's 0, leukocytes small, rbc's 0, nitrites negative, blood  trace.   ASSESSMENT:  1. Near syncope.  2. Possible dehydration.  3. Normocytic and normochromic anemia.  4. Positive urinary tract infection.  5. Hypertension.  6. Diabetes mellitus type 2.  7. Seizure disorder, seizure free for the last 2 years.   PLAN:  Will admit the patient to telemetry.  Will cycle cardiac markers,   with MRA of the brain.  Check metabolic profile.  Stool occult for  blood.  Get a 2-D echocardiogram.  Recheck CBC in the a.m.  Follow  hemoglobin very carefully.  Further evaluation as condition evolves.      Rise Patience, MD  Electronically Signed     ANK/MEDQ  D:  05/26/2008  T:  05/26/2008  Job:  [id number]

## 2010-09-24 ENCOUNTER — Encounter (HOSPITAL_BASED_OUTPATIENT_CLINIC_OR_DEPARTMENT_OTHER): Payer: MEDICARE | Admitting: Oncology

## 2010-09-24 ENCOUNTER — Other Ambulatory Visit: Payer: Self-pay | Admitting: Oncology

## 2010-09-24 DIAGNOSIS — D72829 Elevated white blood cell count, unspecified: Secondary | ICD-10-CM

## 2010-09-24 DIAGNOSIS — D638 Anemia in other chronic diseases classified elsewhere: Secondary | ICD-10-CM

## 2010-09-24 DIAGNOSIS — D649 Anemia, unspecified: Secondary | ICD-10-CM

## 2010-09-24 DIAGNOSIS — D509 Iron deficiency anemia, unspecified: Secondary | ICD-10-CM

## 2010-09-24 LAB — CBC WITH DIFFERENTIAL/PLATELET
BASO%: 0.3 % (ref 0.0–2.0)
Basophils Absolute: 0 10*3/uL (ref 0.0–0.1)
EOS%: 16.1 % — ABNORMAL HIGH (ref 0.0–7.0)
Eosinophils Absolute: 1.1 10*3/uL — ABNORMAL HIGH (ref 0.0–0.5)
HCT: 37.4 % (ref 34.8–46.6)
HGB: 12.2 g/dL (ref 11.6–15.9)
LYMPH%: 23.7 % (ref 14.0–49.7)
MCH: 29.6 pg (ref 25.1–34.0)
MCHC: 32.6 g/dL (ref 31.5–36.0)
MCV: 90.8 fL (ref 79.5–101.0)
MONO#: 0.5 10*3/uL (ref 0.1–0.9)
MONO%: 7.1 % (ref 0.0–14.0)
NEUT#: 3.7 10*3/uL (ref 1.5–6.5)
NEUT%: 52.8 % (ref 38.4–76.8)
Platelets: 233 10*3/uL (ref 145–400)
RBC: 4.12 10*6/uL (ref 3.70–5.45)
RDW: 13.9 % (ref 11.2–14.5)
WBC: 7.1 10*3/uL (ref 3.9–10.3)
lymph#: 1.7 10*3/uL (ref 0.9–3.3)
nRBC: 0 % (ref 0–0)

## 2010-09-25 LAB — COMPREHENSIVE METABOLIC PANEL
ALT: 14 U/L (ref 0–35)
AST: 12 U/L (ref 0–37)
Albumin: 4.2 g/dL (ref 3.5–5.2)
Alkaline Phosphatase: 44 U/L (ref 39–117)
BUN: 26 mg/dL — ABNORMAL HIGH (ref 6–23)
CO2: 25 mEq/L (ref 19–32)
Calcium: 10.6 mg/dL — ABNORMAL HIGH (ref 8.4–10.5)
Chloride: 106 mEq/L (ref 96–112)
Creatinine, Ser: 2.06 mg/dL — ABNORMAL HIGH (ref 0.50–1.10)
Glucose, Bld: 211 mg/dL — ABNORMAL HIGH (ref 70–99)
Potassium: 4 mEq/L (ref 3.5–5.3)
Sodium: 141 mEq/L (ref 135–145)
Total Bilirubin: 0.2 mg/dL — ABNORMAL LOW (ref 0.3–1.2)
Total Protein: 6.3 g/dL (ref 6.0–8.3)

## 2010-09-25 LAB — FERRITIN: Ferritin: 85 ng/mL (ref 10–291)

## 2010-09-25 LAB — FOLATE: Folate: 12.5 ng/mL

## 2010-09-25 LAB — ERYTHROPOIETIN: Erythropoietin: 17.7 m[IU]/mL (ref 2.6–34.0)

## 2010-09-25 LAB — IRON AND TIBC
%SAT: 14 % — ABNORMAL LOW (ref 20–55)
Iron: 48 ug/dL (ref 42–145)
TIBC: 354 ug/dL (ref 250–470)
UIBC: 306 ug/dL

## 2010-09-25 LAB — VITAMIN B12: Vitamin B-12: 1082 pg/mL — ABNORMAL HIGH (ref 211–911)

## 2010-10-01 ENCOUNTER — Encounter (HOSPITAL_BASED_OUTPATIENT_CLINIC_OR_DEPARTMENT_OTHER): Payer: MEDICARE | Admitting: Oncology

## 2010-11-27 LAB — I-STAT 8, (EC8 V) (CONVERTED LAB)
Acid-Base Excess: 1
BUN: 18
Bicarbonate: 26.8 — ABNORMAL HIGH
Chloride: 106
Glucose, Bld: 171 — ABNORMAL HIGH
HCT: 38
Hemoglobin: 12.9
Operator id: 151321
Potassium: 3.5
Sodium: 142
TCO2: 28
pCO2, Ven: 47.5
pH, Ven: 7.36 — ABNORMAL HIGH

## 2010-11-27 LAB — DIFFERENTIAL
Basophils Absolute: 0
Basophils Relative: 0
Eosinophils Absolute: 0.2
Eosinophils Relative: 4
Lymphocytes Relative: 20
Lymphs Abs: 1.1
Monocytes Absolute: 0.4
Monocytes Relative: 8
Neutro Abs: 3.7
Neutrophils Relative %: 68

## 2010-11-27 LAB — CBC
HCT: 35.5 — ABNORMAL LOW
Hemoglobin: 12.1
MCHC: 33.9
MCV: 86.1
Platelets: 276
RBC: 4.13
RDW: 15.3
WBC: 5.5

## 2010-11-27 LAB — POCT I-STAT CREATININE
Creatinine, Ser: 1.2
Operator id: 151321

## 2010-11-27 LAB — CARBAMAZEPINE LEVEL, TOTAL: Carbamazepine Lvl: 6.4

## 2011-04-04 ENCOUNTER — Encounter: Payer: Self-pay | Admitting: *Deleted

## 2011-04-04 ENCOUNTER — Other Ambulatory Visit: Payer: MEDICARE | Admitting: Lab

## 2011-04-04 ENCOUNTER — Ambulatory Visit: Payer: MEDICARE | Admitting: Oncology

## 2011-04-05 ENCOUNTER — Telehealth: Payer: Self-pay | Admitting: Oncology

## 2011-04-05 NOTE — Telephone Encounter (Signed)
Unable to reach the pt by telephone for her r/s f/u appt for April due to her phone is disconnected. Mailed the pt out a calendar to her address.

## 2011-04-15 ENCOUNTER — Telehealth: Payer: Self-pay | Admitting: Oncology

## 2011-04-15 NOTE — Telephone Encounter (Signed)
Pt called to r/s her April appts to august

## 2011-06-10 ENCOUNTER — Other Ambulatory Visit: Payer: MEDICARE | Admitting: Lab

## 2011-06-10 ENCOUNTER — Ambulatory Visit: Payer: MEDICARE | Admitting: Oncology

## 2011-06-12 ENCOUNTER — Other Ambulatory Visit (HOSPITAL_COMMUNITY): Payer: Self-pay | Admitting: Internal Medicine

## 2011-06-12 DIAGNOSIS — Z1231 Encounter for screening mammogram for malignant neoplasm of breast: Secondary | ICD-10-CM

## 2011-07-08 ENCOUNTER — Ambulatory Visit (HOSPITAL_COMMUNITY)
Admission: RE | Admit: 2011-07-08 | Discharge: 2011-07-08 | Disposition: A | Discharge status: Home / Self Care | Payer: MEDICARE | Source: Ambulatory Visit | Attending: Internal Medicine | Admitting: Internal Medicine

## 2011-07-08 ENCOUNTER — Other Ambulatory Visit (HOSPITAL_COMMUNITY): Payer: Self-pay | Admitting: Internal Medicine

## 2011-07-08 DIAGNOSIS — I10 Essential (primary) hypertension: Secondary | ICD-10-CM

## 2011-07-08 DIAGNOSIS — Z1231 Encounter for screening mammogram for malignant neoplasm of breast: Secondary | ICD-10-CM

## 2011-07-08 LAB — HM MAMMOGRAPHY: HM Mammogram: NORMAL

## 2011-10-04 ENCOUNTER — Other Ambulatory Visit: Payer: Self-pay | Admitting: *Deleted

## 2011-10-04 DIAGNOSIS — D649 Anemia, unspecified: Secondary | ICD-10-CM

## 2011-10-07 ENCOUNTER — Other Ambulatory Visit (HOSPITAL_BASED_OUTPATIENT_CLINIC_OR_DEPARTMENT_OTHER): Payer: Medicare Other | Admitting: Lab

## 2011-10-07 DIAGNOSIS — D649 Anemia, unspecified: Secondary | ICD-10-CM

## 2011-10-07 DIAGNOSIS — D509 Iron deficiency anemia, unspecified: Secondary | ICD-10-CM

## 2011-10-07 LAB — CBC WITH DIFFERENTIAL/PLATELET
BASO%: 0.4 % (ref 0.0–2.0)
Basophils Absolute: 0 10*3/uL (ref 0.0–0.1)
EOS%: 10.7 % — ABNORMAL HIGH (ref 0.0–7.0)
Eosinophils Absolute: 0.5 10*3/uL (ref 0.0–0.5)
HCT: 36.2 % (ref 34.8–46.6)
HGB: 12 g/dL (ref 11.6–15.9)
LYMPH%: 29.7 % (ref 14.0–49.7)
MCH: 29.4 pg (ref 25.1–34.0)
MCHC: 33.1 g/dL (ref 31.5–36.0)
MCV: 88.7 fL (ref 79.5–101.0)
MONO#: 0.5 10*3/uL (ref 0.1–0.9)
MONO%: 11.4 % (ref 0.0–14.0)
NEUT#: 2.1 10*3/uL (ref 1.5–6.5)
NEUT%: 47.8 % (ref 38.4–76.8)
Platelets: 193 10*3/uL (ref 145–400)
RBC: 4.08 10*6/uL (ref 3.70–5.45)
RDW: 13.9 % (ref 11.2–14.5)
WBC: 4.5 10*3/uL (ref 3.9–10.3)
lymph#: 1.3 10*3/uL (ref 0.9–3.3)

## 2011-10-07 LAB — FERRITIN: Ferritin: 48 ng/mL (ref 10–291)

## 2011-10-07 LAB — IRON AND TIBC
%SAT: 22 % (ref 20–55)
Iron: 89 ug/dL (ref 42–145)
TIBC: 398 ug/dL (ref 250–470)
UIBC: 309 ug/dL (ref 125–400)

## 2011-10-14 ENCOUNTER — Telehealth: Payer: Self-pay | Admitting: Oncology

## 2011-10-14 ENCOUNTER — Encounter: Payer: Self-pay | Admitting: Oncology

## 2011-10-14 ENCOUNTER — Ambulatory Visit (HOSPITAL_BASED_OUTPATIENT_CLINIC_OR_DEPARTMENT_OTHER): Payer: Medicare Other | Admitting: Oncology

## 2011-10-14 VITALS — BP 117/72 | HR 79 | Temp 98.5°F | Resp 20 | Ht 62.0 in | Wt 224.2 lb

## 2011-10-14 DIAGNOSIS — D508 Other iron deficiency anemias: Secondary | ICD-10-CM

## 2011-10-14 DIAGNOSIS — D509 Iron deficiency anemia, unspecified: Secondary | ICD-10-CM

## 2011-10-14 NOTE — Telephone Encounter (Signed)
gve the pt her aug 2014 appt calendar

## 2011-10-14 NOTE — Patient Instructions (Addendum)
Continue to do well.  I will see you back in 1 year

## 2011-10-14 NOTE — Progress Notes (Signed)
OFFICE PROGRESS NOTE  CC Dr. Rosanne Ashing  DIAGNOSIS: Iron deficiency anemia  PRIOR THERAPY:Oral iron daily  CURRENT THERAPY:oral iron  INTERVAL HISTORY: [name] [name] 78 y.o. female returns for follow up at 1 year. Overall she seems to be doing well. She is tolerating the oral iron well without much side effects, no nausea or vomiting or diarrhea or constipation. She has no bleeding. Remaider of the 10 point review of systems is negative  MEDICAL HISTORY:History reviewed. No pertinent past medical history.  ALLERGIES:   has no known allergies.  MEDICATIONS:  Current Outpatient Prescriptions  Medication Sig Dispense Refill  . ALPRAZolam (XANAX) 0.5 MG tablet Take 0.5 mg by mouth at bedtime as needed.      Marland Kitchen aspirin 81 MG tablet Take 81 mg by mouth daily.      Marland Kitchen atenolol (TENORMIN) 50 MG tablet Take 50 mg by mouth daily.      . fenofibrate micronized (LOFIBRA) 134 MG capsule Take 134 mg by mouth daily before breakfast.      . ferrous sulfate dried (SLOW FE) 160 (50 FE) MG TBCR Take 160 mg by mouth daily.      . fish oil-omega-3 fatty acids 1000 MG capsule Take 2 g by mouth daily.      . Flaxseed, Linseed, (FLAX SEED OIL) 1000 MG CAPS Take 1 capsule by mouth 3 (three) times daily.      . lansoprazole (PREVACID) 30 MG capsule Take 30 mg by mouth daily.      Marland Kitchen levETIRAcetam (KEPPRA) 500 MG tablet Take 500 mg by mouth every 12 (twelve) hours.      Marland Kitchen levothyroxine (SYNTHROID, LEVOTHROID) 100 MCG tablet Take 100 mcg by mouth daily.      Marland Kitchen loratadine (CLARITIN) 10 MG tablet Take 10 mg by mouth daily.      . magnesium oxide (MAG-OX) 400 MG tablet Take 400 mg by mouth daily.      . metFORMIN (GLUCOPHAGE) 500 MG tablet Take 500 mg by mouth 2 (two) times daily with a meal.      . multivitamin-iron-minerals-folic acid (CENTRUM) chewable tablet Chew 1 tablet by mouth daily.      . saxagliptin HCl (ONGLYZA) 2.5 MG TABS tablet Take 5 mg by mouth daily.      . vitamin B-12  (CYANOCOBALAMIN) 100 MCG tablet Take 50 mcg by mouth daily.      . vitamin C (ASCORBIC ACID) 500 MG tablet Take 500 mg by mouth daily.      . Vitamin D, Ergocalciferol, (DRISDOL) 50000 UNITS CAPS Take 10,000 Units by mouth.        SURGICAL HISTORY: History reviewed. No pertinent past surgical history.  REVIEW OF SYSTEMS:  A comprehensive review of systems was negative.   PHYSICAL EXAMINATION: General appearance: alert, cooperative and appears stated age Resp: clear to auscultation bilaterally Back: negative Cardio: regular rate and rhythm GI: soft, non-tender; bowel sounds normal; no masses,  no organomegaly Extremities: extremities normal, atraumatic, no cyanosis or edema Neurologic: Grossly normal  ECOG PERFORMANCE STATUS: 1 - Symptomatic but completely ambulatory  Blood pressure 117/72, pulse 79, temperature 98.5 F (36.9 C), temperature source Oral, resp. rate 20, height 5\' 2"  (1.575 m), weight 224 lb 3.2 oz (101.696 kg).  LABORATORY DATA: Lab Results  Component Value Date   WBC 4.5 10/07/2011   HGB 12.0 10/07/2011   HCT 36.2 10/07/2011   MCV 88.7 10/07/2011   PLT 193 10/07/2011      Chemistry  Component Value Date/Time   NA 141 09/24/2010 1334   NA 141 09/24/2010 1334   K 4.0 09/24/2010 1334   K 4.0 09/24/2010 1334   CL 106 09/24/2010 1334   CL 106 09/24/2010 1334   CO2 25 09/24/2010 1334   CO2 25 09/24/2010 1334   BUN 26* 09/24/2010 1334   BUN 26* 09/24/2010 1334   CREATININE 2.06* 09/24/2010 1334   CREATININE 2.06* 09/24/2010 1334      Component Value Date/Time   CALCIUM 10.6* 09/24/2010 1334   CALCIUM 10.6* 09/24/2010 1334   ALKPHOS 44 09/24/2010 1334   ALKPHOS 44 09/24/2010 1334   AST 12 09/24/2010 1334   AST 12 09/24/2010 1334   ALT 14 09/24/2010 1334   ALT 14 09/24/2010 1334   BILITOT 0.2* 09/24/2010 1334   BILITOT 0.2* 09/24/2010 1334       RADIOGRAPHIC STUDIES:  No results found.  ASSESSMENT: 78 year old female with iron deficiency anemia on oral iron overall doing well with it.  Blood counts look good.   PLAN: Continue follow once a year for now   All questions were answered. The patient knows to call the clinic with any problems, questions or concerns. We can certainly see the patient much sooner if necessary.  I spent 15 minutes counseling the patient face to face. The total time spent in the appointment was 30 minutes.    Marcy Panning, MD Medical/Oncology [facility] [phone] (beeper) [phone] (Office)  10/14/2011, 7:36 PM

## 2011-10-24 ENCOUNTER — Encounter: Payer: Self-pay | Admitting: *Deleted

## 2011-12-07 ENCOUNTER — Emergency Department (HOSPITAL_COMMUNITY): Payer: Medicare Other

## 2011-12-07 ENCOUNTER — Encounter (HOSPITAL_COMMUNITY): Payer: Self-pay | Admitting: Emergency Medicine

## 2011-12-07 ENCOUNTER — Emergency Department (HOSPITAL_COMMUNITY)
Admission: EM | Admit: 2011-12-07 | Discharge: 2011-12-07 | Disposition: A | Discharge status: Home / Self Care | Payer: Medicare Other | Attending: Emergency Medicine | Admitting: Emergency Medicine

## 2011-12-07 DIAGNOSIS — IMO0002 Reserved for concepts with insufficient information to code with codable children: Secondary | ICD-10-CM | POA: Insufficient documentation

## 2011-12-07 DIAGNOSIS — M25559 Pain in unspecified hip: Secondary | ICD-10-CM | POA: Insufficient documentation

## 2011-12-07 DIAGNOSIS — M549 Dorsalgia, unspecified: Secondary | ICD-10-CM | POA: Insufficient documentation

## 2011-12-07 DIAGNOSIS — E119 Type 2 diabetes mellitus without complications: Secondary | ICD-10-CM | POA: Insufficient documentation

## 2011-12-07 DIAGNOSIS — T1490XA Injury, unspecified, initial encounter: Secondary | ICD-10-CM | POA: Insufficient documentation

## 2011-12-07 DIAGNOSIS — W010XXA Fall on same level from slipping, tripping and stumbling without subsequent striking against object, initial encounter: Secondary | ICD-10-CM | POA: Insufficient documentation

## 2011-12-07 DIAGNOSIS — W19XXXA Unspecified fall, initial encounter: Secondary | ICD-10-CM

## 2011-12-07 DIAGNOSIS — Z79899 Other long term (current) drug therapy: Secondary | ICD-10-CM | POA: Insufficient documentation

## 2011-12-07 DIAGNOSIS — I10 Essential (primary) hypertension: Secondary | ICD-10-CM | POA: Insufficient documentation

## 2011-12-07 MED ORDER — OXYCODONE-ACETAMINOPHEN 5-325 MG PO TABS
1.0000 | ORAL_TABLET | Freq: Once | ORAL | Status: AC
  Administered 2011-12-07: 1 via ORAL
  Filled 2011-12-07: qty 1

## 2011-12-07 MED ORDER — OXYCODONE-ACETAMINOPHEN 5-325 MG PO TABS: 2.0000 | ORAL_TABLET | ORAL | Status: DC | PRN

## 2011-12-07 NOTE — ED Provider Notes (Signed)
History     CSN: [id number]  Arrival date & time 12/07/11  1529   First MD Initiated Contact with Patient 12/07/11 1723      Chief Complaint  Patient presents with  . Hip Pain    (Consider location/radiation/quality/duration/timing/severity/associated sxs/prior treatment) Patient is a 78 y.o. female presenting with hip pain. The history is provided by the patient.  Hip Pain Pertinent negatives include no headaches.   78 year old, female, resents to emergency department complaining of a fall.  She states she was walking to the bathroom.  This morning, and she tripped and fell onto her left side.  She was unable to get up and had to crawl to the bed.  She complains of pain in her left hip, and left lower back.  She denies hitting her head.  She denies a headache, neck pain, or pain anywhere else.  She is not taking anticoagulants.  While she lies down.  She has no pain.  When she stands, up.  She has increased pain in her left hip, and left lower back.  Past Medical History  Diagnosis Date  . Anemia, unspecified   . Type II or unspecified type diabetes mellitus without mention of complication, not stated as uncontrolled   . Hypertension   . Meniere's disease   . UTI (urinary tract infection)     Past Surgical History  Procedure Date  . Tonsillectomy   . Wrist surgery     left  . Cervical polypectomy   . Hammer toe surgery     Family History  Problem Relation Age of Onset  . Heart disease Mother   . Heart disease Father   . Ovarian cancer Sister     History  Substance Use Topics  . Smoking status: Never Smoker   . Smokeless tobacco: Never Used  . Alcohol Use: No    OB History    Grav Para Term Preterm Abortions TAB SAB Ect Mult Living                  Review of Systems  HENT: Negative for neck pain.   Eyes: Negative for visual disturbance.  Gastrointestinal: Negative for nausea and vomiting.  Musculoskeletal: Positive for back pain.       Left hip pain    Skin: Negative for wound.  Neurological: Negative for weakness and headaches.  Hematological: Does not bruise/bleed easily.  Psychiatric/Behavioral: Negative for confusion.  All other systems reviewed and are negative.    Allergies  Review of patient's allergies indicates no known allergies.  Home Medications   Current Outpatient Rx  Name Route Sig Dispense Refill  . ALPRAZOLAM 0.5 MG PO TABS Oral Take 0.25 mg by mouth 3 (three) times daily as needed. Vertigo    . ASPIRIN 81 MG PO TABS Oral Take 81 mg by mouth daily.    . ATENOLOL 50 MG PO TABS Oral Take 50 mg by mouth daily.    . FENOFIBRATE MICRONIZED 134 MG PO CAPS Oral Take 134 mg by mouth daily before breakfast.    . FERROUS SULFATE CR 160 (50 FE) MG PO TBCR Oral Take 160 mg by mouth daily.    . OMEGA-3 FATTY ACIDS 1000 MG PO CAPS Oral Take 3 g by mouth daily. Pt takes 3,000 mg at bedtime ( 3 capsules)    . FLAX SEED OIL 1000 MG PO CAPS Oral Take 1 capsule by mouth 3 (three) times daily.    Marland Kitchen LANSOPRAZOLE 30 MG PO CPDR Oral Take  30 mg by mouth daily.    Marland Kitchen LEVETIRACETAM 500 MG PO TABS Oral Take 500 mg by mouth 3 (three) times daily. Brand name only.    Marland Kitchen LEVOTHYROXINE SODIUM 100 MCG PO TABS Oral Take 100 mcg by mouth daily.    Marland Kitchen LORATADINE 10 MG PO TABS Oral Take 10 mg by mouth daily.    Marland Kitchen MAGNESIUM OXIDE 400 MG PO TABS Oral Take 400 mg by mouth 3 (three) times daily.     Marland Kitchen METFORMIN HCL 500 MG PO TABS Oral Take 500-1,000 mg by mouth 2 (two) times daily with a meal. Pt takes 1000 mg in the morning 1 tablet at lunch and 1 tablet at supper    . CENTRUM PO CHEW Oral Chew 1 tablet by mouth daily.    Marland Kitchen SAXAGLIPTIN HCL 2.5 MG PO TABS Oral Take 5 mg by mouth daily.    Marland Kitchen VITAMIN B-12 100 MCG PO TABS Oral Take 50 mcg by mouth daily.    Marland Kitchen VITAMIN C 500 MG PO TABS Oral Take 500 mg by mouth daily.    Marland Kitchen VITAMIN D (ERGOCALCIFEROL) 50000 UNITS PO CAPS Oral Take 10,000 Units by mouth.    . OXYCODONE-ACETAMINOPHEN 5-325 MG PO TABS Oral Take 2  tablets by mouth every 4 (four) hours as needed for pain. 10 tablet 0    BP 138/74  Pulse 84  Temp 99.3 F (37.4 C) (Oral)  Resp 20  SpO2 95%  Physical Exam  Nursing note and vitals reviewed. Constitutional: She is oriented to person, place, and time. No distress.       Morbidly obese  Eyes: Conjunctivae normal are normal.  Neck: Normal range of motion.  Pulmonary/Chest: Effort normal.  Abdominal: She exhibits no distension.  Musculoskeletal: Normal range of motion. She exhibits tenderness. She exhibits no edema.       Mild ttp at left si joint.  No lumbar spine ttp No left le deformity No ttp at left hip, femur or knee. From of left le without pain  Abrasion over left knee. No deformity, ecchymosis, ttp, swelling, effusion  Neurological: She is alert and oriented to person, place, and time.  Skin: Skin is warm and dry.  Psychiatric: She has a normal mood and affect. Thought content normal.    ED Course  Procedures (including critical care time)  Labs Reviewed - No data to display Dg Hip Complete Left  12/07/2011  *RADIOLOGY REPORT*  Clinical Data: Status post fall  LEFT HIP - COMPLETE 2+ VIEW  Comparison: None  Findings: There is no evidence of fracture or dislocation. Mild bilateral hip osteoarthritis and lumbar degenerative disc disease. Soft tissues are unremarkable.  IMPRESSION: 1.  No acute findings. 2.  Mild bilateral hip osteoarthritis   Original Report Authenticated By: Angelita Ingles, M.D.    Dg Knee Complete 4 Views Left  12/07/2011  *RADIOLOGY REPORT*  Clinical Data: Fall  LEFT KNEE - COMPLETE 4+ VIEW  Comparison: None.  Findings: Negative for fracture.  Mild degenerative change in the knee with mild joint space narrowing and spurring.  Negative for effusion.  IMPRESSION: Negative for fracture.   Original Report Authenticated By: Truett Perna, M.D.      1. Fall       MDM  Fall No significant injuries. No fx or dislocation        Barbara Cower, MD 12/07/11 [phone]

## 2011-12-07 NOTE — ED Notes (Signed)
Pt fell this morning when walking to bathroom,  Tripped and fell, did not hit head, no LOC, pain left hip/buttock, abrasion/bruise to left elbow, left knee w/ abrasion. Pt went back to bed, has been able to ambulate but hip pain has worsened throughout the day.

## 2012-10-05 ENCOUNTER — Other Ambulatory Visit (HOSPITAL_BASED_OUTPATIENT_CLINIC_OR_DEPARTMENT_OTHER): Payer: Medicare Other | Admitting: Lab

## 2012-10-05 DIAGNOSIS — D508 Other iron deficiency anemias: Secondary | ICD-10-CM

## 2012-10-05 DIAGNOSIS — D509 Iron deficiency anemia, unspecified: Secondary | ICD-10-CM

## 2012-10-05 LAB — CBC WITH DIFFERENTIAL/PLATELET
BASO%: 0.7 % (ref 0.0–2.0)
Basophils Absolute: 0 10*3/uL (ref 0.0–0.1)
EOS%: 11.8 % — ABNORMAL HIGH (ref 0.0–7.0)
Eosinophils Absolute: 0.6 10*3/uL — ABNORMAL HIGH (ref 0.0–0.5)
HCT: 34.2 % — ABNORMAL LOW (ref 34.8–46.6)
HGB: 11.5 g/dL — ABNORMAL LOW (ref 11.6–15.9)
LYMPH%: 25.1 % (ref 14.0–49.7)
MCH: 30.4 pg (ref 25.1–34.0)
MCHC: 33.8 g/dL (ref 31.5–36.0)
MCV: 90.1 fL (ref 79.5–101.0)
MONO#: 0.5 10*3/uL (ref 0.1–0.9)
MONO%: 9.8 % (ref 0.0–14.0)
NEUT#: 2.9 10*3/uL (ref 1.5–6.5)
NEUT%: 52.6 % (ref 38.4–76.8)
Platelets: 258 10*3/uL (ref 145–400)
RBC: 3.79 10*6/uL (ref 3.70–5.45)
RDW: 14.2 % (ref 11.2–14.5)
WBC: 5.4 10*3/uL (ref 3.9–10.3)
lymph#: 1.4 10*3/uL (ref 0.9–3.3)

## 2012-10-05 LAB — IRON AND TIBC CHCC
%SAT: 27 % (ref 21–57)
Iron: 103 ug/dL (ref 41–142)
TIBC: 381 ug/dL (ref 236–444)
UIBC: 279 ug/dL (ref 120–384)

## 2012-10-05 LAB — FERRITIN CHCC: Ferritin: 44 ng/ml (ref 9–269)

## 2012-10-12 ENCOUNTER — Telehealth: Payer: Self-pay | Admitting: Oncology

## 2012-10-12 ENCOUNTER — Ambulatory Visit (HOSPITAL_BASED_OUTPATIENT_CLINIC_OR_DEPARTMENT_OTHER): Payer: Medicare Other | Admitting: Adult Health

## 2012-10-12 ENCOUNTER — Encounter: Payer: Self-pay | Admitting: Adult Health

## 2012-10-12 VITALS — BP 113/68 | HR 78 | Temp 97.8°F | Resp 18 | Ht 62.0 in | Wt 210.2 lb

## 2012-10-12 DIAGNOSIS — D509 Iron deficiency anemia, unspecified: Secondary | ICD-10-CM

## 2012-10-12 NOTE — Patient Instructions (Addendum)
Doing well.  Continue iron tablets 2 per day.  Please have your lab work in November faxed to [place].  We will see you in 1 year.  Please call us if you have any questions or concerns.

## 2012-10-12 NOTE — Telephone Encounter (Signed)
, °

## 2012-10-12 NOTE — Progress Notes (Signed)
OFFICE PROGRESS NOTE  CC Dr. Rosanne Ashing  DIAGNOSIS: Iron deficiency anemia  PRIOR THERAPY:Oral iron daily  CURRENT THERAPY:oral iron  INTERVAL HISTORY: [name] [name] 79 y.o. female returns for follow up at 1 year. She is doing very well.  She denies fatigue, constipation, diarrhea, stomach upset.  She continues to take 2 iron tablets daily and is tolerating it well.  She was originally on one tablet per day, however had blood work done at Dr. Idell Pickles office.  He increased the iron tablet frequency to two tablets per day.  We do not have records of these labs.    MEDICAL HISTORY: Past Medical History  Diagnosis Date  . Anemia, unspecified   . Type II or unspecified type diabetes mellitus without mention of complication, not stated as uncontrolled   . Hypertension   . Meniere's disease   . UTI (urinary tract infection)     ALLERGIES:  has No Known Allergies.  MEDICATIONS:  Current Outpatient Prescriptions  Medication Sig Dispense Refill  . aspirin 81 MG tablet Take 81 mg by mouth daily.      Marland Kitchen atenolol (TENORMIN) 50 MG tablet Take 50 mg by mouth daily.      Marland Kitchen BIOTIN PO Take 1 tablet by mouth daily.      . fenofibrate micronized (LOFIBRA) 134 MG capsule Take 134 mg by mouth daily before breakfast.      . ferrous sulfate dried (SLOW FE) 160 (50 FE) MG TBCR Take 160 mg by mouth daily.      . fish oil-omega-3 fatty acids 1000 MG capsule Take 3 g by mouth daily. Pt takes 3,000 mg at bedtime ( 3 capsules)      . Flaxseed, Linseed, (FLAX SEED OIL) 1000 MG CAPS Take 1 capsule by mouth 3 (three) times daily.      . lansoprazole (PREVACID) 30 MG capsule Take 30 mg by mouth daily.      Marland Kitchen levETIRAcetam (KEPPRA) 500 MG tablet Take 500 mg by mouth 3 (three) times daily. Brand name only.      Marland Kitchen levothyroxine (SYNTHROID, LEVOTHROID) 100 MCG tablet Take 100 mcg by mouth daily.      Marland Kitchen loratadine (CLARITIN) 10 MG tablet Take 10 mg by mouth daily.      . magnesium oxide  (MAG-OX) 400 MG tablet Take 400 mg by mouth 3 (three) times daily.       . meclizine (ANTIVERT) 25 MG tablet Take 25 mg by mouth 3 (three) times daily as needed.      . metFORMIN (GLUCOPHAGE) 500 MG tablet Take 500-1,000 mg by mouth 2 (two) times daily with a meal. Pt takes 1000 mg in the morning 1 tablet at lunch and 1 tablet at supper      . multivitamin-iron-minerals-folic acid (CENTRUM) chewable tablet Chew 1 tablet by mouth daily.      . Terbinafine HCl (LAMISIL PO) Take 250 mg by mouth 2 (two) times daily.      . vitamin B-12 (CYANOCOBALAMIN) 100 MCG tablet Take 50 mcg by mouth daily.      . vitamin C (ASCORBIC ACID) 500 MG tablet Take 500 mg by mouth daily.      . Vitamin D, Ergocalciferol, (DRISDOL) 50000 UNITS CAPS Take 10,000 Units by mouth.       No current facility-administered medications for this visit.    SURGICAL HISTORY:  Past Surgical History  Procedure Laterality Date  . Tonsillectomy    . Wrist surgery  left  . Cervical polypectomy    . Hammer toe surgery      REVIEW OF SYSTEMS:  A comprehensive review of systems was negative.   PHYSICAL EXAMINATION: BP 113/68  Pulse 78  Temp(Src) 97.8 F (36.6 C) (Oral)  Resp 18  Ht 5\' 2"  (1.575 m)  Wt 210 lb 3 oz (95.34 kg)  BMI 38.43 kg/m2 General: Patient is a well appearing female in no acute distress HEENT: PERRLA, sclerae anicteric no conjunctival pallor, MMM Neck: supple, no palpable adenopathy Lungs: clear to auscultation bilaterally, no wheezes, rhonchi, or rales Cardiovascular: regular rate rhythm, S1, S2, no murmurs, rubs or gallops Abdomen: Soft, non-tender, non-distended, normoactive bowel sounds, no HSM Extremities: warm and well perfused, no clubbing, cyanosis, or edema Skin: No rashes or lesions Neuro: Non-focal ECOG PERFORMANCE STATUS: 1 - Symptomatic but completely ambulatory  LABORATORY DATA: Lab Results  Component Value Date   WBC 5.4 10/05/2012   HGB 11.5* 10/05/2012   HCT 34.2* 10/05/2012    MCV 90.1 10/05/2012   PLT 258 10/05/2012      Chemistry      Component Value Date/Time   NA 141 09/24/2010 1334   K 4.0 09/24/2010 1334   CL 106 09/24/2010 1334   CO2 25 09/24/2010 1334   BUN 26* 09/24/2010 1334   CREATININE 2.06* 09/24/2010 1334      Component Value Date/Time   CALCIUM 10.6* 09/24/2010 1334   ALKPHOS 44 09/24/2010 1334   AST 12 09/24/2010 1334   ALT 14 09/24/2010 1334   BILITOT 0.2* 09/24/2010 1334       RADIOGRAPHIC STUDIES:  No results found.  ASSESSMENT: 79 year old female with iron deficiency anemia on oral iron overall doing well with it. Blood counts look good.   PLAN: Doing well.  Patient will continue iron supplements of 2 tablets per day.  She will follow up with Dr. Melford Aase in November and have her labs re-checked then and faxed to [place].  I recommended 6 month follow up, however, she prefers one year follow up.  We will see her back in one year.     All questions were answered. The patient knows to call the clinic with any problems, questions or concerns. We can certainly see the patient much sooner if necessary.  I spent 15 minutes counseling the patient face to face. The total time spent in the appointment was 30 minutes.  Suzan Garibaldi Sheppard Coil, [place] Phone: [phone]   10/12/2012, 10:57 AM

## 2012-10-15 ENCOUNTER — Ambulatory Visit: Payer: Medicare Other | Admitting: Oncology

## 2012-11-03 ENCOUNTER — Encounter: Payer: Self-pay | Admitting: Neurology

## 2012-11-04 ENCOUNTER — Ambulatory Visit (INDEPENDENT_AMBULATORY_CARE_PROVIDER_SITE_OTHER): Payer: Medicare Other | Admitting: Neurology

## 2012-11-04 ENCOUNTER — Encounter: Payer: Self-pay | Admitting: Neurology

## 2012-11-04 VITALS — BP 136/67 | HR 79 | Resp 17 | Ht 63.0 in | Wt 213.0 lb

## 2012-11-04 DIAGNOSIS — G40909 Epilepsy, unspecified, not intractable, without status epilepticus: Secondary | ICD-10-CM

## 2012-11-04 DIAGNOSIS — E1149 Type 2 diabetes mellitus with other diabetic neurological complication: Secondary | ICD-10-CM

## 2012-11-04 DIAGNOSIS — G40802 Other epilepsy, not intractable, without status epilepticus: Secondary | ICD-10-CM

## 2012-11-04 HISTORY — DX: Other epilepsy, not intractable, without status epilepticus: G40.802

## 2012-11-04 MED ORDER — LEVETIRACETAM 500 MG PO TABS: 500.0000 mg | ORAL_TABLET | Freq: Three times a day (TID) | ORAL | Status: DC

## 2012-11-04 NOTE — Patient Instructions (Signed)
Seizures You had a seizure. About 2% of the population will have a seizure problem during their lifetime. Sometimes the cause for the seizure is not known. Seizures are usually associated with one of these problems:  Epilepsy.   Not taking your seizure medicine.   Alcohol and drug abuse.   Head injury, strokes, tumors, and brain surgery.   High fever and infections.   Low blood sugar.  Evaluating a new seizure disorder may require having a brain scan or a brain wave test called an EEG. If you have been given a seizure medicine, it is very important that you take it as prescribed. Not taking these medicines as directed is the most common cause of seizures. Blood tests are often used to be sure you are taking the proper dose.  Seizures cause many different symptoms, from convulsions to brief blackouts. Do not ride a bike, drive a car, go swimming, climb in high or dangerous places such as ladders or roofs, or operate any dangerous equipment until you have your doctor's permission. If you hold a driver's license, state law may require that a report be made to the motor vehicles department. You should wear an emergency medical identification bracelet with information about your seizures. If you have any warning that a seizure may occur, lie down in a safe place to protect yourself. Teach your family and friends what to do if you have any further seizures. They should stay calm and try to keep you from falling on hard or sharp objects. It is best not to try to restrain a seizing person or to force anything into his or her mouth. Do not try to open clenched jaws. When the seizure is over, the person should be rolled on their side to help drain any vomit or secretions from the mouth. After a seizure, a person may be confused or drowsy for several minutes. An ambulance should be called if the seizure lasted more than 5 minutes or if confusion remains for more than 30 minutes. Call your caregiver or the  emergency department for further instructions. Do not drive until cleared by your caregiver or neurologist! Document Released: 03/14/2004 Document Revised: 10/17/2010 Document Reviewed: 02/04/2005 [facility] Patient Information 2012 [employer] [place], [place].Anemia, Nonspecific Your exam and blood tests show you are anemic. This means your blood (hemoglobin) level is low. Normal hemoglobin values are 12 to 15 g/dL for females and 14 to 17 g/dL for males. Make a note of your hemoglobin level today. The hematocrit percent is also used to measure anemia. A normal hematocrit is 38% to 46% in females and 42% to 49% in males. Make a note of your hematocrit level today. CAUSES  Anemia can be due to many different causes.  Excessive bleeding from periods (in women).  Intestinal bleeding.  Poor nutrition.  Kidney, thyroid, liver, and bone marrow diseases. SYMPTOMS  Anemia can come on suddenly (acute). It can also come on slowly. Symptoms can include:  Minor weakness.  Dizziness.  Palpitations.  Shortness of breath. Symptoms may be absent until half your hemoglobin is missing if it comes on slowly. Anemia due to acute blood loss from an injury or internal bleeding may require blood transfusion if the loss is severe. Hospital care is needed if you are anemic and there is significant continual blood loss. TREATMENT   Stool tests for blood (Hemoccult) and additional lab tests are often needed. This determines the best treatment.  Further checking on your condition and your response to treatment is very  important. It often takes many weeks to correct anemia. Depending on the cause, treatment can include:  Supplements of iron.  Vitamins 123456 and folic acid.  Hormone medicines. If your anemia is due to bleeding, finding the cause of the blood loss is very important. This will help avoid further problems. SEEK IMMEDIATE MEDICAL CARE IF:   You develop fainting, extreme weakness, shortness of breath, or  chest pain.  You develop heavy vaginal bleeding.  You develop bloody or black, tarry stools or vomit up blood.  You develop a high fever, rash, repeated vomiting, or dehydration. Document Released: 03/14/2004 Document Revised: 04/29/2011 Document Reviewed: 12/20/2008 [facility] Patient Information 2014 [place], [place].

## 2012-11-04 NOTE — Progress Notes (Signed)
[facility]  Provider:  Larey Seat, M D  Referring Provider: No ref. provider found Primary Care Physician:  Alesia Richards, MD  Chief Complaint  Patient presents with  . Follow-up    seizures,rm 11    HPI:  [name] [name] is a 79 y.o. female  Is seen here as a referral/ revisit  from Dr. Jones Broom. This patent was last seen in 2009 by myself and yearly by carolyn martin,GNP here at the [facility] office.   [name] [name] is meanwhile 79 years old she returns for repeat routine followup visit she has a history of possible seizures EEG has been normal in the past she also had a normal MRI of the brain in 2012. She presented first in 2008 with vertigo . The patient was placed on Keppra , generic form , by her pharmacy. The next visit with our office we changed her to and brand-name only. The patient has also a history of Mnire's disease. She lives at the [place] home. The patient had an 2001 developed multiple spells of vertigo that were treated with Antivert. However it seems that at the vertigo lasted 2 minutes, was associated with LOC or awareness and therefore a  possible seizure manifestation. Spells  had always lasted 2-3 minutes , are not preceded by an aura. Her last manifestation of what is likely a seizure was in 2012.  She remains Keppra-side effects free, she has lost weight by controlling her portions, had neither falls nor progressing  hearing loss. She is using hearing aids now.  Her direct costs due to [facility] name prescription are 1800 USD per 90 days, and she cannot afford the [facility] prescription. I will refill generic form at 90 days to avoid  Frequent changes between different generica.    She is followed by Dr. Humphrey Rolls for chronic  Iron deficiency anemia, I was able to review her last labs from the cancer center, Dr. Humphrey Rolls. The patient doesn't exercise and her weight loss has been minimal over the last 15 month.  EDS - Epworth 6 points. FSS  32 ,  Naps  occasionally , falls asleep reading the newspaper. She does not believe she has apnea, but was never tested.         Review of Systems: Out of a complete 14 system review, the patient complains of only the following symptoms, and all other reviewed systems are negative. Seizure free, hearing loss, meniere.   History   Social History  . Marital Status: Widowed    Spouse Name: N/A    Number of Children: 3  . Years of Education: 13   Occupational History  . retired    Social History Main Topics  . Smoking status: Former Research scientist (life sciences)  . Smokeless tobacco: Never Used     Comment: 1ppd quit 30 years ago  . Alcohol Use: Yes     Comment: occas.wine  . Drug Use: No  . Sexual Activity: Not Currently   Other Topics Concern  . Not on file   Social History Narrative  . No narrative on file    Family History  Problem Relation Age of Onset  . Heart disease Mother   . Stroke Mother   . Heart disease Father   . Ovarian cancer Sister     Past Medical History  Diagnosis Date  . Anemia, unspecified   . Type II or unspecified type diabetes mellitus without mention of complication, not stated as uncontrolled   . Hypertension   .  Meniere's disease   . UTI (urinary tract infection)   . High cholesterol   . Seizures     Past Surgical History  Procedure Laterality Date  . Tonsillectomy    . Wrist surgery      left  . Cervical polypectomy    . Hammer toe surgery    . Mohs surgery      face    Current Outpatient Prescriptions  Medication Sig Dispense Refill  . aspirin 81 MG tablet Take 81 mg by mouth daily.      Marland Kitchen atenolol (TENORMIN) 50 MG tablet Take 50 mg by mouth daily.      Marland Kitchen BIOTIN PO Take by mouth daily. 10,000 units,one tablet daily      . fenofibrate micronized (LOFIBRA) 134 MG capsule Take 134 mg by mouth daily before breakfast.      . ferrous sulfate dried (SLOW FE) 160 (50 FE) MG TBCR Take 160 mg by mouth 2 (two) times daily.       . fish oil-omega-3 fatty acids  1000 MG capsule Take 3 g by mouth daily. Pt takes 3,000 mg at bedtime ( 3 capsules)      . Flaxseed, Linseed, (FLAX SEED OIL) 1000 MG CAPS Take 1 capsule by mouth 3 (three) times daily.      . lansoprazole (PREVACID) 30 MG capsule Take 30 mg by mouth daily.      Marland Kitchen levETIRAcetam (KEPPRA) 500 MG tablet Take 500 mg by mouth 3 (three) times daily. Brand name only.      Marland Kitchen levothyroxine (SYNTHROID, LEVOTHROID) 100 MCG tablet Take 100 mcg by mouth daily.      Marland Kitchen loratadine (CLARITIN) 10 MG tablet Take 10 mg by mouth daily.      . magnesium oxide (MAG-OX) 400 MG tablet Take 400 mg by mouth 3 (three) times daily.       . meclizine (ANTIVERT) 25 MG tablet Take 25 mg by mouth 3 (three) times daily as needed.      . metFORMIN (GLUCOPHAGE) 500 MG tablet Take 500-1,000 mg by mouth 2 (two) times daily with a meal. Pt takes 1000 mg in the morning 1 tablet at lunch and 1 tablet at supper      . multivitamin-iron-minerals-folic acid (CENTRUM) chewable tablet Chew 1 tablet by mouth daily.      . Terbinafine HCl (LAMISIL PO) Take 250 mg by mouth 2 (two) times daily.      . vitamin B-12 (CYANOCOBALAMIN) 100 MCG tablet Take 50 mcg by mouth daily.      . vitamin C (ASCORBIC ACID) 500 MG tablet Take 500 mg by mouth daily.      . Vitamin D, Ergocalciferol, (DRISDOL) 50000 UNITS CAPS Take 10,000 Units by mouth.       No current facility-administered medications for this visit.    Allergies as of 11/04/2012  . (No Known Allergies)    Vitals: BP 136/67  Pulse 79  Resp 17  Ht 5\' 3"  (1.6 m)  Wt 213 lb (96.616 kg)  BMI 37.74 kg/m2 Last Weight:  Wt Readings from Last 1 Encounters:  11/04/12 213 lb (96.616 kg)   Last Height:   Ht Readings from Last 1 Encounters:  11/04/12 5\' 3"  (1.6 m)    Physical exam:  General: The patient is awake, alert and appears not in acute distress. The patient is well groomed. Head: Normocephalic, atraumatic. Neck is supple. Mallampati 3 , neck circumference: 16 inches, no nasal  deviation.  Cardiovascular:  Regular  rate and rhythm, without  murmurs or carotid bruit, and without distended neck veins. Respiratory: Lungs are clear to auscultation. Skin:  Without evidence of edema, or rash Trunk: BMI is elevated . This patient  has normal posture.  Neurologic exam : The patient is awake and alert, oriented to place and time.  Memory subjective  described as intact. There is a normal attention span & concentration ability. Speech is fluent without  dysarthria, dysphonia or aphasia. Mood and affect are appropriate.  Cranial nerves: Pupils are equal and briskly reactive to light. Funduscopic exam without  evidence of pallor or edema.  Cataract surgery bilaterally .Extraocular movements  in vertical and horizontal planes intact and without nystagmus. Visual fields by finger perimetry are intact. Hearing corrected by hearing aids.  Facial sensation intact to fine touch. Facial motor strength is symmetric and tongue and uvula move midline.  Motor exam:   Normal tone and normal muscle bulk and symmetric normal strength in all extremities.  Sensory:  Fine touch, pinprick and vibration were tested in all extremities. Proprioception is tested in the upper extremities and  normal.  Coordination: Rapid alternating movements in the fingers/hands is tested and normal. Normal handwriting.  Finger-to-nose maneuver tested and normal without evidence of ataxia, dysmetria or tremor.  Gait and station: Patient walks without assistive deviceStrength within normal limits. Stance is stable and normal. Wider based. Steps are unfragmented. Deep tendon reflexes: in the  upper and lower extremities are symmetric and intact. Babinski maneuver response is  downgoing.   Assessment:  Seizure disorder , refilled Keppra at current dose.  hearing loss ,related to age and meniere's . Vertigo resolved on Keppra !   Plan:   See above .

## 2012-12-04 ENCOUNTER — Encounter: Payer: Self-pay | Admitting: Internal Medicine

## 2012-12-23 ENCOUNTER — Ambulatory Visit: Payer: Medicare Other | Admitting: Internal Medicine

## 2012-12-23 ENCOUNTER — Encounter: Payer: Self-pay | Admitting: Internal Medicine

## 2012-12-23 VITALS — BP 136/80 | HR 76 | Temp 96.3°F | Resp 18 | Wt 213.6 lb

## 2012-12-23 DIAGNOSIS — E559 Vitamin D deficiency, unspecified: Secondary | ICD-10-CM

## 2012-12-23 DIAGNOSIS — E782 Mixed hyperlipidemia: Secondary | ICD-10-CM

## 2012-12-23 DIAGNOSIS — E1149 Type 2 diabetes mellitus with other diabetic neurological complication: Secondary | ICD-10-CM

## 2012-12-23 DIAGNOSIS — E119 Type 2 diabetes mellitus without complications: Secondary | ICD-10-CM

## 2012-12-23 DIAGNOSIS — Z79899 Other long term (current) drug therapy: Secondary | ICD-10-CM

## 2012-12-23 DIAGNOSIS — I10 Essential (primary) hypertension: Secondary | ICD-10-CM

## 2012-12-23 DIAGNOSIS — G40802 Other epilepsy, not intractable, without status epilepticus: Secondary | ICD-10-CM

## 2012-12-23 LAB — BASIC METABOLIC PANEL WITH GFR
BUN: 32 mg/dL — ABNORMAL HIGH (ref 6–23)
CO2: 26 mEq/L (ref 19–32)
Calcium: 10.8 mg/dL — ABNORMAL HIGH (ref 8.4–10.5)
Chloride: 105 mEq/L (ref 96–112)
Creat: 1.62 mg/dL — ABNORMAL HIGH (ref 0.50–1.10)
GFR, Est African American: 35 mL/min — ABNORMAL LOW
GFR, Est Non African American: 30 mL/min — ABNORMAL LOW
Glucose, Bld: 154 mg/dL — ABNORMAL HIGH (ref 70–99)
Potassium: 4.7 mEq/L (ref 3.5–5.3)
Sodium: 137 mEq/L (ref 135–145)

## 2012-12-23 LAB — CBC WITH DIFFERENTIAL/PLATELET
Basophils Absolute: 0 10*3/uL (ref 0.0–0.1)
Basophils Relative: 1 % (ref 0–1)
Eosinophils Absolute: 0.5 10*3/uL (ref 0.0–0.7)
Eosinophils Relative: 10 % — ABNORMAL HIGH (ref 0–5)
HCT: 36.1 % (ref 36.0–46.0)
Hemoglobin: 11.9 g/dL — ABNORMAL LOW (ref 12.0–15.0)
Lymphocytes Relative: 32 % (ref 12–46)
Lymphs Abs: 1.5 10*3/uL (ref 0.7–4.0)
MCH: 29.5 pg (ref 26.0–34.0)
MCHC: 33 g/dL (ref 30.0–36.0)
MCV: 89.4 fL (ref 78.0–100.0)
Monocytes Absolute: 0.5 10*3/uL (ref 0.1–1.0)
Monocytes Relative: 9 % (ref 3–12)
Neutro Abs: 2.3 10*3/uL (ref 1.7–7.7)
Neutrophils Relative %: 48 % (ref 43–77)
Platelets: 268 10*3/uL (ref 150–400)
RBC: 4.04 MIL/uL (ref 3.87–5.11)
RDW: 13.6 % (ref 11.5–15.5)
WBC: 4.8 10*3/uL (ref 4.0–10.5)

## 2012-12-23 LAB — MAGNESIUM: Magnesium: 2.1 mg/dL (ref 1.5–2.5)

## 2012-12-23 LAB — HEPATIC FUNCTION PANEL
ALT: 23 U/L (ref 0–35)
AST: 18 U/L (ref 0–37)
Albumin: 4.4 g/dL (ref 3.5–5.2)
Alkaline Phosphatase: 32 U/L — ABNORMAL LOW (ref 39–117)
Bilirubin, Direct: 0.1 mg/dL (ref 0.0–0.3)
Indirect Bilirubin: 0.3 mg/dL (ref 0.0–0.9)
Total Bilirubin: 0.4 mg/dL (ref 0.3–1.2)
Total Protein: 6.9 g/dL (ref 6.0–8.3)

## 2012-12-23 LAB — HEMOGLOBIN A1C
Hgb A1c MFr Bld: 7.4 % — ABNORMAL HIGH (ref <5.7)
Mean Plasma Glucose: 166 mg/dL — ABNORMAL HIGH (ref <117)

## 2012-12-23 LAB — LIPID PANEL
Cholesterol: 178 mg/dL (ref 0–200)
HDL: 29 mg/dL — ABNORMAL LOW (ref >39)
LDL Cholesterol: 95 mg/dL (ref 0–99)
Total CHOL/HDL Ratio: 6.1 Ratio
Triglycerides: 271 mg/dL — ABNORMAL HIGH (ref <150)
VLDL: 54 mg/dL — ABNORMAL HIGH (ref 0–40)

## 2012-12-23 LAB — TSH: TSH: 2.146 u[IU]/mL (ref 0.350–4.500)

## 2012-12-23 MED ORDER — LEVETIRACETAM 500 MG PO TABS: 500.0000 mg | ORAL_TABLET | Freq: Three times a day (TID) | ORAL | Status: DC

## 2012-12-23 MED ORDER — TERBINAFINE HCL 250 MG PO TABS: 250.0000 mg | ORAL_TABLET | Freq: Every day | ORAL | Status: DC

## 2012-12-23 NOTE — Patient Instructions (Signed)
Continue diet & meds same as discussed Cholesterol Cholesterol is a white, waxy, fat-like protein needed by your body in small amounts. The liver makes all the cholesterol you need. It is carried from the liver by the blood through the blood vessels. Deposits (plaque) may build up on blood vessel walls. This makes the arteries narrower and stiffer. Plaque increases the risk for heart attack and stroke. You cannot feel your cholesterol level even if it is very high. The only way to know is by a blood test to check your lipid (fats) levels. Once you know your cholesterol levels, you should keep a record of the test results. Work with your caregiver to to keep your levels in the desired range. WHAT THE RESULTS MEAN:  Total cholesterol is a rough measure of all the cholesterol in your blood.  LDL is the so-called bad cholesterol. This is the type that deposits cholesterol in the walls of the arteries. You want this level to be low.  HDL is the good cholesterol because it cleans the arteries and carries the LDL away. You want this level to be high.  Triglycerides are fat that the body can either burn for energy or store. High levels are closely linked to heart disease. DESIRED LEVELS:  Total cholesterol below 200.  LDL below 100 for people at risk, below 70 for very high risk.  HDL above 50 is good, above 60 is best.  Triglycerides below 150. HOW TO LOWER YOUR CHOLESTEROL:  Diet.  Choose fish or white meat chicken and [place], roasted or baked. Limit fatty cuts of red meat, fried foods, and processed meats, such as sausage and lunch meat.  Eat lots of fresh fruits and vegetables. Choose whole grains, beans, pasta, potatoes and cereals.  Use only small amounts of olive, corn or canola oils. Avoid butter, mayonnaise, shortening or palm kernel oils. Avoid foods with trans-fats.  Use skim/nonfat milk and low-fat/nonfat yogurt and cheeses. Avoid whole milk, cream, ice cream, egg yolks and  cheeses. Healthy desserts include angel food cake, ginger snaps, animal crackers, hard candy, popsicles, and low-fat/nonfat frozen yogurt. Avoid pastries, cakes, pies and cookies.  Exercise.  A regular program helps decrease LDL and raises HDL.  Helps with weight control.  Do things that increase your activity level like gardening, walking, or taking the stairs.  Medication.  May be prescribed by your caregiver to help lowering cholesterol and the risk for heart disease.  You may need medicine even if your levels are normal if you have several risk factors. HOME CARE INSTRUCTIONS   Follow your diet and exercise programs as suggested by your caregiver.  Take medications as directed.  Have blood work done when your caregiver feels it is necessary. MAKE SURE YOU:   Understand these instructions.  Will watch your condition.  Will get help right away if you are not doing well or get worse. Document Released: 10/30/2000 Document Revised: 04/29/2011 Document Reviewed: 04/22/2007 [facility] Patient Information 2014 [place], [place]. Hypertension As your heart beats, it forces blood through your arteries. This force is your blood pressure. If the pressure is too high, it is called hypertension (HTN) or high blood pressure. HTN is dangerous because you may have it and not know it. High blood pressure may mean that your heart has to work harder to pump blood. Your arteries may be narrow or stiff. The extra work puts you at risk for heart disease, stroke, and other problems.  Blood pressure consists of two numbers, a higher  number over a lower, 110/72, for example. It is stated as "110 over 72." The ideal is below 120 for the top number (systolic) and under 80 for the bottom (diastolic). Write down your blood pressure today. You should pay close attention to your blood pressure if you have certain conditions such as:  Heart failure.  Prior heart attack.  Diabetes  Chronic kidney  disease.  Prior stroke.  Multiple risk factors for heart disease. To see if you have HTN, your blood pressure should be measured while you are seated with your arm held at the level of the heart. It should be measured at least twice. A one-time elevated blood pressure reading (especially in the Emergency Department) does not mean that you need treatment. There may be conditions in which the blood pressure is different between your right and left arms. It is important to see your caregiver soon for a recheck. Most people have essential hypertension which means that there is not a specific cause. This type of high blood pressure may be lowered by changing lifestyle factors such as:  Stress.  Smoking.  Lack of exercise.  Excessive weight.  Drug/tobacco/alcohol use.  Eating less salt. Most people do not have symptoms from high blood pressure until it has caused damage to the body. Effective treatment can often prevent, delay or reduce that damage. TREATMENT  When a cause has been identified, treatment for high blood pressure is directed at the cause. There are a large number of medications to treat HTN. These fall into several categories, and your caregiver will help you select the medicines that are best for you. Medications may have side effects. You should review side effects with your caregiver. If your blood pressure stays high after you have made lifestyle changes or started on medicines,   Your medication(s) may need to be changed.  Other problems may need to be addressed.  Be certain you understand your prescriptions, and know how and when to take your medicine.  Be sure to follow up with your caregiver within the time frame advised (usually within two weeks) to have your blood pressure rechecked and to review your medications.  If you are taking more than one medicine to lower your blood pressure, make sure you know how and at what times they should be taken. Taking two medicines  at the same time can result in blood pressure that is too low. SEEK IMMEDIATE MEDICAL CARE IF:  You develop a severe headache, blurred or changing vision, or confusion.  You have unusual weakness or numbness, or a faint feeling.  You have severe chest or abdominal pain, vomiting, or breathing problems. MAKE SURE YOU:   Understand these instructions.  Will watch your condition.  Will get help right away if you are not doing well or get worse. Document Released: 02/04/2005 Document Revised: 04/29/2011 Document Reviewed: 09/25/2007 [facility] Patient Information 2014 [place]. Diabetes and Standards of Medical Care  Diabetes is complicated. You may find that your diabetes team includes a dietitian, nurse, diabetes educator, eye doctor, and more. To help everyone know what is going on and to help you get the care you deserve, the following schedule of care was developed to help keep you on track. Below are the tests, exams, vaccines, medicines, education, and plans you will need. HbA1c test This test shows how well you have controlled your glucose over the past 2 3 months. It is used to see if your diabetes management plan needs to be adjusted.  It is performed at least 2 times a year if you are meeting treatment goals.  It is performed 4 times a year if therapy has changed or if you are not meeting treatment goals. Blood pressure test  This test is performed at every routine medical visit. The goal is less than 140/90 mmHg for most people, but 130/80 mmHg in some cases. Ask your health care provider about your goal. Dental exam  Follow up with the dentist regularly. Eye exam  If you are diagnosed with type 1 diabetes as a child, get an exam upon reaching the age of 99 years or older and have had diabetes for 3 5 years. Yearly eye exams are recommended after that initial eye exam.  If you are diagnosed with type 1 diabetes as an adult, get an exam within 5 years of diagnosis and  then yearly.  If you are diagnosed with type 2 diabetes, get an exam as soon as possible after the diagnosis and then yearly. Foot care exam  Visual foot exams are performed at every routine medical visit. The exams check for cuts, injuries, or other problems with the feet.  A comprehensive foot exam should be done yearly. This includes visual inspection as well as assessing foot pulses and testing for loss of sensation.  Check your feet nightly for cuts, injuries, or other problems with your feet. Tell your health care provider if anything is not healing. Kidney function test (urine microalbumin)  This test is performed once a year.  Type 1 diabetes: The first test is performed 5 years after diagnosis.  Type 2 diabetes: The first test is performed at the time of diagnosis.  A serum creatinine and estimated glomerular filtration rate (eGFR) test is done once a year to assess the level of chronic kidney disease (CKD), if present. Lipid profile (cholesterol, HDL, LDL, triglycerides)  Performed every 5 years for most people.  The goal for LDL is less than 100 mg/dL. If you are at high risk, the goal is less than 70 mg/dL.  The goal for HDL is 40 mg/dL 50 mg/dL for men and 50 mg/dL 60 mg/dL for women. An HDL cholesterol of 60 mg/dL or higher gives some protection against heart disease.  The goal for triglycerides is less than 150 mg/dL. Influenza vaccine, pneumococcal vaccine, and hepatitis B vaccine  The influenza vaccine is recommended yearly.  The pneumococcal vaccine is generally given once in a lifetime. However, there are some instances when another vaccination is recommended. Check with your health care provider.  The hepatitis B vaccine is also recommended for adults with diabetes. Diabetes self-management education  Education is recommended at diagnosis and ongoing as needed. Treatment plan  Your treatment plan is reviewed at every medical visit. Document Released:  12/02/2008 Document Revised: 10/07/2012 Document Reviewed: 07/07/2012 [facility] Patient Information 2014 [employer].

## 2012-12-23 NOTE — Progress Notes (Signed)
Patient ID: [name] [name], female   DOB: May 03, 1932, 79 y.o.   MRN: [id number] HPI: Patient presents for 3 month follow up with hypertension, hyperlipidemia, pre-diabetes and vitamin D deficiency.  BP has/has not been controlled at home, today's BP. Patient denies chest pain, palpitations, dyspnea/orthopnea/PND, dizziness, claudication, or edema. Dyslipidemia is controlled with diet & is on meds. Patient has lost 3 lbs dieting. Denies myalgias w/o med SE's. His/her last cholesterol was    Component Value Date/Time   CHOL     TRIG 123 05/27/2008 0145   HDL 26* 05/27/2008 0145   LDLCALC   66     05/27/2008 0145   Also; patient has diabetes  with last A1c 6.1%  Denies diabetic polys, paresthesias, or visual blurring. Patient relates FBG's range in the 150-170's. She's had no hypoglycemic episodes.  Hx/o vitamin D deficiency with pt supplementing same.  Current outpatient prescriptions:aspirin 81 MG tablet, Take 81 mg by mouth daily., Disp: , Rfl: ;  atenolol (TENORMIN) 50 MG tablet, Take 50 mg by mouth daily., Disp: , Rfl: ;  BIOTIN PO, Take by mouth daily. 10,000 units,one tablet daily, Disp: , Rfl: ;  fenofibrate micronized (LOFIBRA) 134 MG capsule, Take 134 mg by mouth daily before breakfast., Disp: , Rfl:  ferrous sulfate dried (SLOW FE) 160 (50 FE) MG TBCR, Take 160 mg by mouth 2 (two) times daily. , Disp: , Rfl: ;  fish oil-omega-3 fatty acids 1000 MG capsule, Take 3 g by mouth daily. Pt takes 3,000 mg at bedtime ( 3 capsules), Disp: , Rfl: ;  Flaxseed, Linseed, (FLAX SEED OIL) 1000 MG CAPS, Take 1 capsule by mouth 3 (three) times daily., Disp: , Rfl: ;  lansoprazole (PREVACID) 30 MG capsule, Take 30 mg by mouth daily., Disp: , Rfl:  levETIRAcetam (KEPPRA) 500 MG tablet, Take 1 tablet (500 mg total) by mouth 3 (three) times daily. Generic is OK, Disp: 270 tablet, Rfl: 3;  levothyroxine (SYNTHROID, LEVOTHROID) 100 MCG tablet, Take 100 mcg by mouth daily., Disp: , Rfl: ;  loratadine (CLARITIN) 10 MG  tablet, Take 10 mg by mouth daily., Disp: , Rfl: ;  magnesium oxide (MAG-OX) 400 MG tablet, Take 400 mg by mouth 3 (three) times daily. , Disp: , Rfl:  meclizine (ANTIVERT) 25 MG tablet, Take 25 mg by mouth 3 (three) times daily as needed., Disp: , Rfl: ;  metFORMIN (GLUCOPHAGE) 500 MG tablet, Take 500-1,000 mg by mouth 2 (two) times daily with a meal. Pt takes 1000 mg in the morning 1 tablet at lunch and 1 tablet at supper, Disp: , Rfl: ;  multivitamin-iron-minerals-folic acid (CENTRUM) chewable tablet, Chew 1 tablet by mouth daily., Disp: , Rfl:  terbinafine (LAMISIL) 250 MG tablet, Take 1 tablet (250 mg total) by mouth daily., Disp: , Rfl: ;  vitamin B-12 (CYANOCOBALAMIN) 100 MCG tablet, Take 50 mcg by mouth daily., Disp: , Rfl: ;  vitamin C (ASCORBIC ACID) 500 MG tablet, Take 500 mg by mouth daily., Disp: , Rfl: ;  Vitamin D, Ergocalciferol, (DRISDOL) 50000 UNITS CAPS, Take 10,000 Units by mouth., Disp: , Rfl:   Ppd Systems Review: Constitutional: Denies fever, chills, wt changes, headaches, insomnia, fatigue, night sweats, change in appetite. Eyes: Denies redness, blurred vision, diplopia, discharge, itchy, watery eyes.  ENT: Denies discharge, congestion, post nasal drip, epistaxis, sore throat, earache, hearing loss, dental pain, Tinnitus, Vertigo, Sinus pain, snoring.  CV: Denies chest pain, palpitations, irregular heartbeat, syncope, dyspnea, diaphoresis, orthopnea, PND, claudication, edema Respiratory: denies cough, dyspnea, DOE,  pleurisy, hoarseness, laryngitis, wheezing.  Gastrointestinal: Denies dysphagia, oodynophagia, heartburn, reflux, waterbrash, pain, cramps, nausea, vomiting, bloating, diarrhea, constipation, hematemesis, melena, hematochezia, jaundice, hemorrhoids Genitourinary: Denies dysuria, frequency, urgency, nocturia, hesistancy, discharge, hematuria, flank pain Musculoskeletal: Denies arthralgias, myalgias, stiffness, Jt. Swelling, pain, limp, strain/sprain.  Skin: Denies  pruritus, rash, hives, warts, acne, eczema, change in in skin lesion(s) Neuro: No weakness, tremor, incoordination, spasms, paresthesia, pain Psychiatric: Denies confusion, memory loss, sensory loss Endo: Denies change in weight, skin, hair change, nocturia, diabetic polys, paresthesias, visual blurring, hyper/hypo-glycemic episodes.  Heme/Lymph: Excessive bleeding, bruising, enlarged lymph nodes PMHx: FHx: Reviewed / unchanged SHx: Reviewed / unchanged Filed Vitals:   12/23/12 1117  BP: 136/80  Pulse: 76  Temp: 96.3 F (35.7 C)  Resp: 18   On Exam: Appears well nourished - in NAD. Eyes: PERRLA, EOMs, conjunctiva no swelling or erythema, normal fundi and vessels. Sinuses: No Frontal/maxillary tenderness ENT/Mouth: EACs clear, TMs nl w/o erythema, bulging. Nares clear w/o erythema, swelling, exudates. OroPharynx clear. No ulcers, cracking, on lips. Dentition- unremarkable. No erythema. Tongue normal, non obstructing. Tonsils not swollen or erythematous. Hearing intact.  Neck: Supple. Car 2+/2+. Thyroid nl. No LN, bruits or JVD. Chest: Respirations nl, BS equal w/o  rales, rhonci, wheezing or stridor.  Cardio: Heart sounds normal, regular rate and rhythm without sig. murmurs, gallops,clicks, or rubs. Peripheral pulses brisk and equal bilaterally, without edema.  Abdomen: Flat, soft, with bowl sounds. Non-tender,  no guarding, rebound, hernias, masses, or organomegaly.  Lymphatics: Non tender without lymphadenopathy.  Musculoskeletal: Full ROM all peripheral extremities, joint stability, 5/5 strength, and normal gait.  Skin: Warm, dry without rashes, lesions, ecchymosis.  Neuro: Cranial nerves intact, reflexes equal bilaterally. Sensory-motor testing grossly intact. Tendon reflexes grossly intact.  Pysch: Alert & oriented x 3. Insight and judgement nl & appropriate. No ideations Assessment and Plan: 1. Hypertension: Continue medication, monitor blood pressure at home. Continue  diet/meds. 2. Cholesterol: Continue diet/meds, exercise,& lifestyle modifications. Continue monitor cholesterol/LFTs.  3. Pre-diabetes/Diabesity- Continue diet, exercise, lifestyle modifications. Monitor appropriate labs. 4. Vitamin D Deficiency

## 2012-12-24 ENCOUNTER — Telehealth: Payer: Self-pay | Admitting: *Deleted

## 2012-12-24 ENCOUNTER — Other Ambulatory Visit: Payer: Self-pay

## 2012-12-24 LAB — VITAMIN D 25 HYDROXY (VIT D DEFICIENCY, FRACTURES): Vit D, 25-Hydroxy: 82 ng/mL (ref 30–89)

## 2012-12-24 LAB — INSULIN, FASTING: Insulin fasting, serum: 44 u[IU]/mL — ABNORMAL HIGH (ref 3–28)

## 2012-12-24 NOTE — Telephone Encounter (Signed)
Pt aware of labs  

## 2012-12-24 NOTE — Telephone Encounter (Signed)
Message copied by Emelda Brothers on Thu Dec 24, 2012  3:30 PM ------      Message from: Unk Pinto      Created: Thu Dec 24, 2012  1:01 PM       Bun/creatine sl elevated - need to drink lots more water - chol 178 great but trig 271 too high - avoid fried, sweets/candy & white stuff - vit D 82 great - A1c 7.4 % high - stricter diet and wt loss as above all else OK ------

## 2013-01-13 ENCOUNTER — Encounter: Payer: Self-pay | Admitting: Internal Medicine

## 2013-02-15 ENCOUNTER — Other Ambulatory Visit: Payer: Self-pay | Admitting: Internal Medicine

## 2013-02-15 DIAGNOSIS — E119 Type 2 diabetes mellitus without complications: Secondary | ICD-10-CM

## 2013-02-15 DIAGNOSIS — E039 Hypothyroidism, unspecified: Secondary | ICD-10-CM

## 2013-02-15 MED ORDER — METFORMIN HCL ER (MOD) 500 MG PO TB24: ORAL_TABLET | ORAL | Status: DC

## 2013-02-15 MED ORDER — LEVOTHYROXINE SODIUM 100 MCG PO TABS: 100.0000 ug | ORAL_TABLET | Freq: Every day | ORAL | Status: DC

## 2013-02-22 ENCOUNTER — Other Ambulatory Visit: Payer: Self-pay | Admitting: Physician Assistant

## 2013-02-22 DIAGNOSIS — E119 Type 2 diabetes mellitus without complications: Secondary | ICD-10-CM

## 2013-02-22 MED ORDER — METFORMIN HCL ER (MOD) 500 MG PO TB24: ORAL_TABLET | ORAL | Status: DC

## 2013-02-25 ENCOUNTER — Other Ambulatory Visit: Payer: Self-pay

## 2013-02-25 DIAGNOSIS — E119 Type 2 diabetes mellitus without complications: Secondary | ICD-10-CM

## 2013-02-25 MED ORDER — METFORMIN HCL ER 500 MG PO TB24: ORAL_TABLET | ORAL | Status: DC

## 2013-02-26 ENCOUNTER — Other Ambulatory Visit: Payer: Self-pay | Admitting: Internal Medicine

## 2013-02-26 DIAGNOSIS — E119 Type 2 diabetes mellitus without complications: Secondary | ICD-10-CM

## 2013-02-26 MED ORDER — METFORMIN HCL ER 500 MG PO TB24: ORAL_TABLET | ORAL | Status: DC

## 2013-03-01 ENCOUNTER — Other Ambulatory Visit: Payer: Self-pay | Admitting: Internal Medicine

## 2013-03-01 DIAGNOSIS — E119 Type 2 diabetes mellitus without complications: Secondary | ICD-10-CM

## 2013-03-01 MED ORDER — METFORMIN HCL ER 500 MG PO TB24
ORAL_TABLET | ORAL | Status: DC
Start: 2013-03-01 — End: 2013-06-28

## 2013-03-09 ENCOUNTER — Other Ambulatory Visit: Payer: Self-pay | Admitting: Internal Medicine

## 2013-03-09 MED ORDER — FENOFIBRATE MICRONIZED 134 MG PO CAPS: 134.0000 mg | ORAL_CAPSULE | Freq: Every day | ORAL | Status: DC

## 2013-03-29 ENCOUNTER — Encounter: Payer: Self-pay | Admitting: Physician Assistant

## 2013-03-29 ENCOUNTER — Ambulatory Visit (INDEPENDENT_AMBULATORY_CARE_PROVIDER_SITE_OTHER): Payer: Medicare Other | Admitting: Physician Assistant

## 2013-03-29 VITALS — BP 122/70 | HR 64 | Temp 98.5°F | Resp 16 | Ht 62.0 in | Wt 210.0 lb

## 2013-03-29 DIAGNOSIS — E039 Hypothyroidism, unspecified: Secondary | ICD-10-CM

## 2013-03-29 DIAGNOSIS — IMO0002 Reserved for concepts with insufficient information to code with codable children: Secondary | ICD-10-CM

## 2013-03-29 DIAGNOSIS — Z79899 Other long term (current) drug therapy: Secondary | ICD-10-CM

## 2013-03-29 DIAGNOSIS — E119 Type 2 diabetes mellitus without complications: Secondary | ICD-10-CM

## 2013-03-29 DIAGNOSIS — K219 Gastro-esophageal reflux disease without esophagitis: Secondary | ICD-10-CM

## 2013-03-29 DIAGNOSIS — E785 Hyperlipidemia, unspecified: Secondary | ICD-10-CM

## 2013-03-29 DIAGNOSIS — E559 Vitamin D deficiency, unspecified: Secondary | ICD-10-CM

## 2013-03-29 LAB — CBC WITH DIFFERENTIAL/PLATELET
Basophils Absolute: 0 10*3/uL (ref 0.0–0.1)
Basophils Relative: 1 % (ref 0–1)
Eosinophils Absolute: 0.4 10*3/uL (ref 0.0–0.7)
Eosinophils Relative: 8 % — ABNORMAL HIGH (ref 0–5)
HCT: 35.8 % — ABNORMAL LOW (ref 36.0–46.0)
Hemoglobin: 11.7 g/dL — ABNORMAL LOW (ref 12.0–15.0)
Lymphocytes Relative: 30 % (ref 12–46)
Lymphs Abs: 1.6 10*3/uL (ref 0.7–4.0)
MCH: 29 pg (ref 26.0–34.0)
MCHC: 32.7 g/dL (ref 30.0–36.0)
MCV: 88.8 fL (ref 78.0–100.0)
Monocytes Absolute: 0.5 10*3/uL (ref 0.1–1.0)
Monocytes Relative: 9 % (ref 3–12)
Neutro Abs: 2.7 10*3/uL (ref 1.7–7.7)
Neutrophils Relative %: 52 % (ref 43–77)
Platelets: 260 10*3/uL (ref 150–400)
RBC: 4.03 MIL/uL (ref 3.87–5.11)
RDW: 14.3 % (ref 11.5–15.5)
WBC: 5.2 10*3/uL (ref 4.0–10.5)

## 2013-03-29 LAB — BASIC METABOLIC PANEL WITH GFR
BUN: 38 mg/dL — ABNORMAL HIGH (ref 6–23)
CO2: 24 mEq/L (ref 19–32)
Calcium: 11.1 mg/dL — ABNORMAL HIGH (ref 8.4–10.5)
Chloride: 102 mEq/L (ref 96–112)
Creat: 2.07 mg/dL — ABNORMAL HIGH (ref 0.50–1.10)
GFR, Est African American: 26 mL/min — ABNORMAL LOW
GFR, Est Non African American: 22 mL/min — ABNORMAL LOW
Glucose, Bld: 177 mg/dL — ABNORMAL HIGH (ref 70–99)
Potassium: 4.9 mEq/L (ref 3.5–5.3)
Sodium: 137 mEq/L (ref 135–145)

## 2013-03-29 LAB — HEPATIC FUNCTION PANEL
ALT: 20 U/L (ref 0–35)
AST: 16 U/L (ref 0–37)
Albumin: 4.3 g/dL (ref 3.5–5.2)
Alkaline Phosphatase: 32 U/L — ABNORMAL LOW (ref 39–117)
Bilirubin, Direct: 0.1 mg/dL (ref 0.0–0.3)
Indirect Bilirubin: 0.3 mg/dL (ref 0.2–1.2)
Total Bilirubin: 0.4 mg/dL (ref 0.2–1.2)
Total Protein: 6.5 g/dL (ref 6.0–8.3)

## 2013-03-29 LAB — MAGNESIUM: Magnesium: 2 mg/dL (ref 1.5–2.5)

## 2013-03-29 LAB — LIPID PANEL
Cholesterol: 170 mg/dL (ref 0–200)
HDL: 29 mg/dL — ABNORMAL LOW (ref >39)
LDL Cholesterol: 86 mg/dL (ref 0–99)
Total CHOL/HDL Ratio: 5.9 Ratio
Triglycerides: 275 mg/dL — ABNORMAL HIGH (ref <150)
VLDL: 55 mg/dL — ABNORMAL HIGH (ref 0–40)

## 2013-03-29 LAB — TSH: TSH: 2.499 u[IU]/mL (ref 0.350–4.500)

## 2013-03-29 LAB — HEMOGLOBIN A1C
Hgb A1c MFr Bld: 7.5 % — ABNORMAL HIGH (ref <5.7)
Mean Plasma Glucose: 169 mg/dL — ABNORMAL HIGH (ref <117)

## 2013-03-29 MED ORDER — PROMETHAZINE-CODEINE 6.25-10 MG/5ML PO SYRP: 5.0000 mL | ORAL_SOLUTION | Freq: Four times a day (QID) | ORAL | Status: DC | PRN

## 2013-03-29 MED ORDER — GLIMEPIRIDE 4 MG PO TABS: ORAL_TABLET | ORAL | Status: DC

## 2013-03-29 NOTE — Patient Instructions (Signed)
We are starting you on a medication called Amaryl. This medications forces your pancreas to secrete insulin and will lower your sugar. Please start just 1/2 pill with largest meal, if you do not eat DO NOT take this medications.     Bad carbs also include fruit juice, alcohol, and sweet tea. These are empty calories that do not signal to your brain that you are full.   Please remember the good carbs are still carbs which convert into sugar. So please measure them out no more than 1/2-1 cup of rice, oatmeal, pasta, and beans.  Veggies are however free foods! Pile them on.   I like lean protein at every meal such as chicken, [place], pork chops, cottage cheese, etc. Just do not fry these meats and please center your meal around vegetable, the meats should be a side dish.   No all fruit is created equal. Please see the list below, the fruit at the bottom is higher in sugars than the fruit at the top   We want weight loss that will last so you should lose 1-2 pounds a week.  THAT IS IT! Please pick THREE things a month to change. Once it is a habit check off the item. Then pick another three items off the list to become habits.  If you are already doing a habit on the list GREAT!  Cross that item off! o Don't drink your calories. Ie, alcohol, soda, fruit juice, and sweet tea.  o Drink more water. Drink a glass when you feel hungry or before each meal.  o Eat breakfast - Complex carb and protein (likeDannon light and fit yogurt, oatmeal, fruit, eggs, [place] bacon). o Measure your cereal.  Eat no more than one cup a day. (ie [place]) o Eat an apple a day. o Add a vegetable a day. o Try a new vegetable a month. o Use Pam! Stop using oil or butter to cook. o Don't finish your plate or use smaller plates. o Share your dessert. o Eat sugar free Jello for dessert or frozen grapes. o Don't eat 2-3 hours before bed. o Switch to whole wheat bread, pasta, and brown rice. o Make healthier choices when you  eat out. No fries! o Pick baked chicken, NOT fried. o Don't forget to SLOW DOWN when you eat. It is not going anywhere.  o Take the stairs. o Park far away in the parking lot o [employer] (or weights) for 10 minutes while watching TV. o Walk at work for 10 minutes during break. o Walk outside 1 time a week with your friend, kids, dog, or significant other. o Start a walking group at [place] the mall as much as you can tolerate.  o Keep a food diary. o Weigh yourself daily. o Walk for 15 minutes 3 days per week. o Cook at home more often and eat out less.  If life happens and you go back to old habits, it is okay.  Just start over. You can do it!   If you experience chest pain, get short of breath, or tired during the exercise, please stop immediately and inform your doctor.

## 2013-03-29 NOTE — Progress Notes (Signed)
HPI 80 y.o. female  presents for 3 month follow up with hypertension, hyperlipidemia, diabetes and vitamin D. Her blood pressure has been controlled at home, today their BP is BP: 122/70 mmHg She denies chest pain, shortness of breath, dizziness.  Her cholesterol is diet controlled. In addition they are on fenofibrate and denies myalgias. Her cholesterol is at goal. The cholesterol last visit was:   Lab Results  Component Value Date   CHOL 178 12/23/2012   HDL 29* 12/23/2012   LDLCALC 95 12/23/2012   TRIG 271* 12/23/2012   CHOLHDL 6.1 12/23/2012   She has been working on diet and exercise for Diabetes, and denies blurry vision, polydipsia, polyphagia and polyuria. + Paraesthesias but does not want meds at this time. Her sugars have been running highest 170's- lowest 120's.  Last A1C in the office is not at goal and was:  Lab Results  Component Value Date   HGBA1C 7.4* 12/23/2012   She is on thyroid medication. Her medication was not changed last visit. Patient denies diarrhea, heat / cold intolerance, nervousness and palpitations.  Lab Results  Component Value Date   TSH 2.146 12/23/2012    Patient is on Vitamin D supplement.   She states she has had cold symptoms for one week, she states everything is getting better except the cough. She is taking robitussin and cough drops. She has a clear productive cough, with mild wheezing with lying down but no shortness of breath. Not currently smoking.   Current Medications:  Current Outpatient Prescriptions on File Prior to Visit  Medication Sig Dispense Refill  . aspirin 81 MG tablet Take 81 mg by mouth daily.      Marland Kitchen atenolol (TENORMIN) 50 MG tablet Take 50 mg by mouth daily.      Marland Kitchen BIOTIN PO Take by mouth daily. 10,000 units,one tablet daily      . fenofibrate micronized (LOFIBRA) 134 MG capsule Take 1 capsule (134 mg total) by mouth daily before breakfast.  90 capsule  3  . ferrous sulfate dried (SLOW FE) 160 (50 FE) MG TBCR Take 160 mg by mouth  2 (two) times daily.       . fish oil-omega-3 fatty acids 1000 MG capsule Take 3 g by mouth daily. Pt takes 3,000 mg at bedtime ( 3 capsules)      . Flaxseed, Linseed, (FLAX SEED OIL) 1000 MG CAPS Take 1 capsule by mouth 3 (three) times daily.      . lansoprazole (PREVACID) 30 MG capsule Take 30 mg by mouth daily.      Marland Kitchen levETIRAcetam (KEPPRA) 500 MG tablet Take 1 tablet (500 mg total) by mouth 3 (three) times daily. Generic is OK  270 tablet  3  . levothyroxine (SYNTHROID, LEVOTHROID) 100 MCG tablet Take 1 tablet (100 mcg total) by mouth daily.  90 tablet  99  . loratadine (CLARITIN) 10 MG tablet Take 10 mg by mouth daily.      . magnesium oxide (MAG-OX) 400 MG tablet Take 400 mg by mouth 3 (three) times daily.       . meclizine (ANTIVERT) 25 MG tablet Take 25 mg by mouth 3 (three) times daily as needed.      . metFORMIN (GLUCOPHAGE XR) 500 MG 24 hr tablet 4 tablets daily or as directed for Diabetes  120 tablet  99  . multivitamin-iron-minerals-folic acid (CENTRUM) chewable tablet Chew 1 tablet by mouth daily.      Marland Kitchen terbinafine (LAMISIL) 250 MG tablet Take 1  tablet (250 mg total) by mouth daily.      . vitamin B-12 (CYANOCOBALAMIN) 100 MCG tablet Take 50 mcg by mouth daily.      . vitamin C (ASCORBIC ACID) 500 MG tablet Take 500 mg by mouth daily.      . Vitamin D, Ergocalciferol, (DRISDOL) 50000 UNITS CAPS Take 10,000 Units by mouth.       No current facility-administered medications on file prior to visit.   Medical History:  Past Medical History  Diagnosis Date  . Anemia, unspecified   . Type II or unspecified type diabetes mellitus without mention of complication, not stated as uncontrolled   . Hypertension   . Meniere's disease   . UTI (urinary tract infection)   . High cholesterol   . Seizures   . Other forms of epilepsy and recurrent seizures without mention of intractable epilepsy 11/04/2012    Non convulsive paroxysmal spells, responding to [facility], patient not driving.   Marland Kitchen  GERD (gastroesophageal reflux disease)   . Vitamin D deficiency   . Gout   . Hypothyroidism   . Obesity (BMI 30-39.9)    Allergies:  Allergies  Allergen Reactions  . Ppd [Tuberculin Purified Protein Derivative]     indurated    ROS:  [X]  = yes, [ ]  = no   General: Fatigue [ ] ; Fever [ ] ; Chills [ ] ; Weakness [ ]   Insomnia [ ]  Eyes: Redness [ ]  Blurred vision [ ]  Diplopia [ ]   ENT: Congestion [ ]  Sinus Pain [ ]  Post Nasal Drip [ ]  Sore Throat [ ]  Earache [ ]   Cardiac: Chest pain/pressure [ ] ; SOB [ ] ; Orthopnea [ ] ;  Edema [ ] ; Palpitations [ ] ;  Paroxysmal nocturnal dyspnea[ ]  Claudication [ ]   Pulmonary: Cough [X] ; Wheezing[ ] ; SOB [ ]   Snoring [ ]   GI: Nausea [ ]  Vomiting[ ] ; Dysphagia[ ] ; Heartburn[ ] ; Abdominal pain [ ] ; Constipation [ ] ; Diarrhea [ ] ; BRBPR [ ]  Melena[ ]  GU: Hematuria[ ] ; Dysuria [ ] ; Nocturia[ ]  Urgency [ ]   Hesitancy [ ]  Discharge [ ]  Neuro: Headaches[ ] ; Vertigo[ ] ; Seizures[ ] ; Paresthesias[ ] ;Blurred vision [ ] ; Diplopia [ ] ; Vision changes [ ]   Ortho: Arthritis [ ] ; Joint pain [ ] ; Muscle pain [ ] ; Joint swelling [ ] ; Back Pain [ ] ; Skin:  Rash [ ]   Pruritis [ ]  Change in skin lesion [ ]   Psych: Depression[ ] ; Anxiety[ ]  confusion [ ]  Memory loss [ ]   Heme/Lypmh: Bleeding [ ] ; Bruising [ ] ; Enlarged lymph nodes [ ]   Endocrine: visual blurring [ ]  paresthesia [ X ] polyuria [ ]  polydypsea [ ]    Family history- Review and unchanged Social history- Review and unchanged Physical Exam: Filed Vitals:   03/29/13 0856  BP: 122/70  Pulse: 64  Temp: 98.5 F (36.9 C)  Resp: 16   Filed Weights   03/29/13 0856  Weight: 210 lb (95.255 kg)   General Appearance: Well nourished, in no apparent distress. Eyes: PERRLA, EOMs, conjunctiva no swelling or erythema Sinuses: No Frontal/maxillary tenderness ENT/Mouth: Ext aud canals clear, TMs without erythema, bulging. No erythema, swelling, or exudate on post pharynx.  Tonsils not swollen or erythematous. Hearing  normal.  Neck: Supple, thyroid normal.  Respiratory: Respiratory effort normal, BS equal bilaterally without rales, rhonchi, wheezing or stridor.  Cardio: RRR with no MRGs. Brisk peripheral pulses without edema.  Abdomen: Soft, obese + BS.  Non tender, no guarding, rebound, hernias, masses. Lymphatics: Non  tender without lymphadenopathy.  Musculoskeletal: Full ROM, 5/5 strength, normal gait.  Skin: Warm, dry without rashes, lesions, ecchymosis.  Neuro: Cranial nerves intact. No cerebellar symptoms. Sensation intact.  Psych: Awake and oriented X 3, normal affect, Insight and Judgment appropriate.   Assessment and Plan:  Hypertension: Continue medication, monitor blood pressure at home.  Continue DASH diet. Cholesterol: Continue diet and exercise. Check cholesterol.  Diabetes-Continue diet and exercise. Not at goal. We will add glipizide 1/2 tablet largest meal Check A1C DM neuropathy- check feet daily, does not want meds at this time.  Morbid obesity- discussed weight loss, diet Hypothyroid-check TSH level, continue medications the same.  Vitamin D Def- check level and continue medications.  Cough- likely residual from cold, lungs CTAB, O2 98% RA, give cough syrup call if symptoms worse/dont improve   Continue diet and meds as discussed. Further disposition pending results of labs. Discussed med's effects and SE's.    Vicie Mutters 9:01 AM

## 2013-03-30 LAB — INSULIN, FASTING: Insulin fasting, serum: 30 u[IU]/mL — ABNORMAL HIGH (ref 3–28)

## 2013-03-30 LAB — VITAMIN D 25 HYDROXY (VIT D DEFICIENCY, FRACTURES): Vit D, 25-Hydroxy: 97 ng/mL — ABNORMAL HIGH (ref 30–89)

## 2013-03-30 NOTE — Progress Notes (Signed)
LMOM TO CALL  

## 2013-04-12 ENCOUNTER — Other Ambulatory Visit: Payer: Medicare Other

## 2013-04-12 DIAGNOSIS — D509 Iron deficiency anemia, unspecified: Secondary | ICD-10-CM

## 2013-04-12 LAB — BASIC METABOLIC PANEL WITH GFR
BUN: 39 mg/dL — ABNORMAL HIGH (ref 6–23)
CO2: 24 mEq/L (ref 19–32)
Calcium: 10.7 mg/dL — ABNORMAL HIGH (ref 8.4–10.5)
Chloride: 107 mEq/L (ref 96–112)
Creat: 2.17 mg/dL — ABNORMAL HIGH (ref 0.50–1.10)
GFR, Est African American: 24 mL/min — ABNORMAL LOW
GFR, Est Non African American: 21 mL/min — ABNORMAL LOW
Glucose, Bld: 201 mg/dL — ABNORMAL HIGH (ref 70–99)
Potassium: 5.2 mEq/L (ref 3.5–5.3)
Sodium: 138 mEq/L (ref 135–145)

## 2013-04-13 LAB — PTH, INTACT AND CALCIUM
Calcium: 10.7 mg/dL — ABNORMAL HIGH (ref 8.4–10.5)
PTH: 42.3 pg/mL (ref 14.0–72.0)

## 2013-05-11 ENCOUNTER — Other Ambulatory Visit: Payer: Self-pay | Admitting: *Deleted

## 2013-05-11 MED ORDER — LANSOPRAZOLE 30 MG PO CPDR: 30.0000 mg | DELAYED_RELEASE_CAPSULE | Freq: Every day | ORAL | Status: DC

## 2013-05-17 ENCOUNTER — Ambulatory Visit (INDEPENDENT_AMBULATORY_CARE_PROVIDER_SITE_OTHER): Payer: Medicare Other

## 2013-05-17 VITALS — BP 122/64 | HR 68

## 2013-05-17 DIAGNOSIS — N179 Acute kidney failure, unspecified: Secondary | ICD-10-CM

## 2013-05-17 LAB — BASIC METABOLIC PANEL WITH GFR
BUN: 26 mg/dL — ABNORMAL HIGH (ref 6–23)
CO2: 24 mEq/L (ref 19–32)
Calcium: 10.1 mg/dL (ref 8.4–10.5)
Chloride: 109 mEq/L (ref 96–112)
Creat: 1.69 mg/dL — ABNORMAL HIGH (ref 0.50–1.10)
GFR, Est African American: 33 mL/min — ABNORMAL LOW
GFR, Est Non African American: 29 mL/min — ABNORMAL LOW
Glucose, Bld: 154 mg/dL — ABNORMAL HIGH (ref 70–99)
Potassium: 4.9 mEq/L (ref 3.5–5.3)
Sodium: 141 mEq/L (ref 135–145)

## 2013-05-17 NOTE — Progress Notes (Signed)
Patient ID: [name] [name], female   DOB: 06-21-1932, 80 y.o.   MRN: [id number] Patient here today for recheck of abnormal labs.

## 2013-06-27 ENCOUNTER — Encounter: Payer: Self-pay | Admitting: Internal Medicine

## 2013-06-27 DIAGNOSIS — E559 Vitamin D deficiency, unspecified: Secondary | ICD-10-CM | POA: Insufficient documentation

## 2013-06-27 DIAGNOSIS — E1169 Type 2 diabetes mellitus with other specified complication: Secondary | ICD-10-CM | POA: Insufficient documentation

## 2013-06-27 DIAGNOSIS — E782 Mixed hyperlipidemia: Secondary | ICD-10-CM | POA: Insufficient documentation

## 2013-06-27 NOTE — Progress Notes (Signed)
Patient ID: [name] [name], female   DOB: September 15, 1932, 80 y.o.   MRN: [id number]   Annual Screening Comprehensive Examination  This very nice 80 y.o. WWF presents for complete physical.  Patient has been followed for HTN, T2 NIDDM, Atypical Seizures, Hypothyroidism,  Hyperlipidemia, and Vitamin D Deficiency.    HTN predates since  1999. Patient's BP has been controlled and today's BP: 138/76 mmHg. Patient had a negative 2DEC in 2009.Patient denies any cardiac symptoms as chest pain, palpitations, shortness of breath, dizziness or ankle swelling.   Patient's hyperlipidemia is controlled with diet and medications. Patient denies myalgias or other medication SE's. Last cholesterol last visit was 170, triglycerides 275, HDL 29 and LDL 86 in Feb 2015 - at goal..     Patient has Morbid Obesity (BMI 42) and consequent T2 NIDDM w/Stage CKD (GFR 29 ml/min) predating since 2001 and last A1c was 7.5% in Feb 2015. Patient reports CBG's range about 130's. Patient denies reactive hypoglycemic symptoms, visual blurring, diabetic polys, or paresthesias.    Finally, patient has history of Vitamin D Deficiency of 37 in 2008 and last vitamin D was 97 in Feb 2015.   Medication Sig  . aspirin 81 MG tablet Take 81 mg by mouth daily.  Marland Kitchen atenolol (TENORMIN) 50 MG tablet Take 50 mg by mouth daily.  Marland Kitchen BIOTIN PO Take by mouth daily. 10,000 units,one tablet daily  . fenofibrate  (LOFIBRA) 134 MG  Take 1 capsule (134 mg total) by mouth daily before breakfast.  . ferrous sulfate dried (SLOW FE) 160 MG  Take 160 mg by mouth 2 (two) times daily.   . fish oil 1000 MG capsule Take 3 g by mouth daily. Pt takes 3,000 mg at bedtime ( 3 capsules)  . FLAX SEED OIL 1000 MG CAPS Take 1 capsule by mouth 3 (three) times daily.  Marland Kitchen glimepiride (AMARYL) 4 MG tablet 1/2-1 pill daily with your largest meal.  . lansoprazole (PREVACID) 30 MG capsule Take 1 capsule (30 mg total) by mouth daily.  Marland Kitchen levETIRAcetam (KEPPRA) 500 MG tablet Take 1  tablet (500 mg total) by mouth 3 (three) times daily. Generic is OK  . levothyroxine  100 MCG tablet Take 1 tablet (100 mcg total) by mouth daily.  Marland Kitchen loratadine (CLARITIN) 10 MG tablet Take 10 mg by mouth daily.  . magnesium oxide (MAG-OX) 400 MG tablet Take 400 mg by mouth 3 (three) times daily.   . meclizine (ANTIVERT) 25 MG tablet Take 25 mg by mouth 3 (three) times daily as needed.  .  (CENTRUM) chewable tablet Chew 1 tablet by mouth daily.  . vitamin B-12  100 MCG tablet Take 50 mcg by mouth daily.  . vitamin C (ASCORBIC ACID) 500 MG tablet Take 500 mg by mouth daily.    Allergies  Allergen Reactions  . Ppd [Tuberculin Purified Protein Derivative]     indurated    Past Medical History  Diagnosis Date  . Anemia, unspecified   . Type II or unspecified type diabetes mellitus without mention of complication, not stated as uncontrolled   . Hypertension   . Meniere's disease   . UTI (urinary tract infection)   . High cholesterol   . Seizures   . Other forms of epilepsy and recurrent seizures without mention of intractable epilepsy 11/04/2012    Non convulsive paroxysmal spells, responding to [facility], patient not driving.   Marland Kitchen GERD (gastroesophageal reflux disease)   . Vitamin D deficiency   . Gout   .  Hypothyroidism   . Obesity (BMI 30-39.9)   . DM neuropathy, type II diabetes mellitus     Past Surgical History  Procedure Laterality Date  . Tonsillectomy    . Wrist surgery      left  . Cervical polypectomy    . Hammer toe surgery    . Mohs surgery      face    Family History  Problem Relation Age of Onset  . Heart disease Mother   . Stroke Mother   . Heart disease Father   . Ovarian cancer Sister   . Hypertension Son   . Hypertension Son   . Diabetes Son   . Alcohol abuse Son     History  Substance Use Topics  . Smoking status: Former Smoker    Quit date: 06/29/1983  . Smokeless tobacco: Never Used     Comment: 1ppd quit 30 years ago  . Alcohol Use: No      Comment: [email or web address]    ROS Constitutional: Denies fever, chills, weight loss/gain, headaches, insomnia, fatigue, night sweats, and change in appetite. Eyes: Denies redness, blurred vision, diplopia, discharge, itchy, watery eyes.  ENT: Denies discharge, congestion, post nasal drip, epistaxis, sore throat, earache, hearing loss, dental pain, Tinnitus, Vertigo, Sinus pain, snoring.  Cardio: Denies chest pain, palpitations, irregular heartbeat, syncope, dyspnea, diaphoresis, orthopnea, PND, claudication, edema Respiratory: denies cough, dyspnea, DOE, pleurisy, hoarseness, laryngitis, wheezing.  Gastrointestinal: Denies dysphagia, heartburn, reflux, water brash, pain, cramps, nausea, vomiting, bloating, diarrhea, constipation, hematemesis, melena, hematochezia, jaundice, hemorrhoids Genitourinary: Denies dysuria, frequency, urgency, nocturia, hesitancy, discharge, hematuria, flank pain Breast:Breast lumps, nipple discharge, bleeding.  Musculoskeletal: Denies arthralgia, myalgia, stiffness, Jt. Swelling, pain, limp, and strain/sprain. Skin: Denies puritis, rash, hives, warts, acne, eczema, changing in skin lesion Neuro: No weakness, tremor, incoordination, spasms, paresthesia, pain Psychiatric: Denies confusion, memory loss, sensory loss Endocrine: Denies change in weight, skin, hair change, nocturia, and paresthesia, diabetic polys, visual blurring, hyper / hypo glycemic episodes.  Heme/Lymph: No excessive bleeding, bruising, enlarged lymph nodes.   Physical Exam  BP 138/76  Pulse 76  Temp(Src) 98 F (36.7 C) (Temporal)  Resp 16  Ht 5' 2.5" (1.588 m)  Wt 212 lb (96.163 kg)  BMI 38.13 kg/m2  General Appearance: Well nourished, in no apparent distress. Eyes: PERRLA, EOMs, conjunctiva no swelling or erythema, normal fundi and vessels. Sinuses: No frontal/maxillary tenderness ENT/Mouth: EACs patent / TMs  nl. Nares clear without erythema, swelling, mucoid exudates. Oral hygiene is good. No  erythema, swelling, or exudate. Tongue normal, non-obstructing. Tonsils not swollen or erythematous. Hearing normal.  Neck: Supple, thyroid normal. No bruits, nodes or JVD. Respiratory: Respiratory effort normal.  BS equal and clear bilateral without rales, rhonci, wheezing or stridor. Cardio: Heart sounds are normal with regular rate and rhythm and no murmurs, rubs or gallops. Peripheral pulses are normal and equal bilaterally without edema. No aortic or femoral bruits. Chest: symmetric with normal excursions and percussion. Breasts: Symmetric, without lumps, nipple discharge, retractions, or fibrocystic changes.  Abdomen: rotund & pendulous. Soft with bowel sounds active. Nontender, no guarding, rebound, hernias, masses, or organomegaly.  Lymphatics: Non tender without lymphadenopathy.  Genitourinary:  Musculoskeletal: Full ROM all peripheral extremities, joint stability, 5/5 strength, and normal gait. No falls. Skin: Warm and dry without rashes, lesions, cyanosis, clubbing or  ecchymosis.  Neuro: Cranial nerves intact, reflexes equal bilaterally. Normal muscle tone, no cerebellar symptoms. Sensation in a stocking distribution to the ankles.  Pysch: Awake and oriented X 3, normal affect, Insight  and Judgment appropriate. Denies depression  Assessment and Plan  1. Annual Screening Examination 2. Hypertension  3. Hyperlipidemia 4. T2 NIDDM w/Stage 4 CKD and peripheral sensory neuropathy 5. Vitamin D Deficiency 6. GERD /Hx/o Esophageal Stricture 7. Hypothyroidism 8. Obesity 9. Seizure Disorder Continue prudent diet as discussed, weight control, BP monitoring, regular exercise, and medications. Discussed med's effects and SE's. Screening labs and tests as requested with regular follow-up as recommended.

## 2013-06-27 NOTE — Patient Instructions (Signed)

## 2013-06-28 ENCOUNTER — Other Ambulatory Visit: Payer: Self-pay | Admitting: Internal Medicine

## 2013-06-28 ENCOUNTER — Encounter: Payer: Self-pay | Admitting: Internal Medicine

## 2013-06-28 ENCOUNTER — Ambulatory Visit (INDEPENDENT_AMBULATORY_CARE_PROVIDER_SITE_OTHER): Payer: Medicare Other | Admitting: Internal Medicine

## 2013-06-28 VITALS — BP 138/76 | HR 76 | Temp 98.0°F | Resp 16 | Ht 62.5 in | Wt 212.0 lb

## 2013-06-28 DIAGNOSIS — Z1212 Encounter for screening for malignant neoplasm of rectum: Secondary | ICD-10-CM

## 2013-06-28 DIAGNOSIS — E782 Mixed hyperlipidemia: Secondary | ICD-10-CM

## 2013-06-28 DIAGNOSIS — I10 Essential (primary) hypertension: Secondary | ICD-10-CM

## 2013-06-28 DIAGNOSIS — Z0000 Encounter for general adult medical examination without abnormal findings: Secondary | ICD-10-CM

## 2013-06-28 DIAGNOSIS — E1129 Type 2 diabetes mellitus with other diabetic kidney complication: Secondary | ICD-10-CM

## 2013-06-28 DIAGNOSIS — E559 Vitamin D deficiency, unspecified: Secondary | ICD-10-CM

## 2013-06-28 DIAGNOSIS — Z1331 Encounter for screening for depression: Secondary | ICD-10-CM

## 2013-06-28 DIAGNOSIS — L309 Dermatitis, unspecified: Secondary | ICD-10-CM

## 2013-06-28 DIAGNOSIS — Z789 Other specified health status: Secondary | ICD-10-CM

## 2013-06-28 DIAGNOSIS — Z79899 Other long term (current) drug therapy: Secondary | ICD-10-CM

## 2013-06-28 DIAGNOSIS — E039 Hypothyroidism, unspecified: Secondary | ICD-10-CM

## 2013-06-28 DIAGNOSIS — D649 Anemia, unspecified: Secondary | ICD-10-CM

## 2013-06-28 LAB — CBC WITH DIFFERENTIAL/PLATELET
Basophils Absolute: 0.1 10*3/uL (ref 0.0–0.1)
Basophils Relative: 1 % (ref 0–1)
Eosinophils Absolute: 0.6 10*3/uL (ref 0.0–0.7)
Eosinophils Relative: 11 % — ABNORMAL HIGH (ref 0–5)
HCT: 36.4 % (ref 36.0–46.0)
Hemoglobin: 12.1 g/dL (ref 12.0–15.0)
Lymphocytes Relative: 29 % (ref 12–46)
Lymphs Abs: 1.6 10*3/uL (ref 0.7–4.0)
MCH: 29 pg (ref 26.0–34.0)
MCHC: 33.2 g/dL (ref 30.0–36.0)
MCV: 87.3 fL (ref 78.0–100.0)
Monocytes Absolute: 0.6 10*3/uL (ref 0.1–1.0)
Monocytes Relative: 11 % (ref 3–12)
Neutro Abs: 2.6 10*3/uL (ref 1.7–7.7)
Neutrophils Relative %: 48 % (ref 43–77)
Platelets: 266 10*3/uL (ref 150–400)
RBC: 4.17 MIL/uL (ref 3.87–5.11)
RDW: 15 % (ref 11.5–15.5)
WBC: 5.5 10*3/uL (ref 4.0–10.5)

## 2013-06-28 MED ORDER — CLOBETASOL PROPIONATE 0.05 % EX FOAM: Freq: Two times a day (BID) | CUTANEOUS | Status: DC

## 2013-06-28 MED ORDER — CLOBETASOL PROPIONATE 0.05 % EX CREA: TOPICAL_CREAM | CUTANEOUS | Status: DC

## 2013-06-29 LAB — TSH: TSH: 1.993 u[IU]/mL (ref 0.350–4.500)

## 2013-06-29 LAB — URINALYSIS, MICROSCOPIC ONLY
Casts: NONE SEEN
Crystals: NONE SEEN
Squamous Epithelial / LPF: NONE SEEN

## 2013-06-29 LAB — BASIC METABOLIC PANEL WITHOUT GFR
BUN: 38 mg/dL — ABNORMAL HIGH (ref 6–23)
CO2: 20 meq/L (ref 19–32)
Calcium: 10.8 mg/dL — ABNORMAL HIGH (ref 8.4–10.5)
Chloride: 106 meq/L (ref 96–112)
Creat: 1.83 mg/dL — ABNORMAL HIGH (ref 0.50–1.10)
GFR, Est African American: 30 mL/min — ABNORMAL LOW
GFR, Est Non African American: 26 mL/min — ABNORMAL LOW
Glucose, Bld: 127 mg/dL — ABNORMAL HIGH (ref 70–99)
Potassium: 4.6 meq/L (ref 3.5–5.3)
Sodium: 137 meq/L (ref 135–145)

## 2013-06-29 LAB — LIPID PANEL
Cholesterol: 168 mg/dL (ref 0–200)
HDL: 34 mg/dL — ABNORMAL LOW (ref >39)
LDL Cholesterol: 82 mg/dL (ref 0–99)
Total CHOL/HDL Ratio: 4.9 Ratio
Triglycerides: 258 mg/dL — ABNORMAL HIGH (ref <150)
VLDL: 52 mg/dL — ABNORMAL HIGH (ref 0–40)

## 2013-06-29 LAB — INSULIN, FASTING: Insulin fasting, serum: 43 u[IU]/mL — ABNORMAL HIGH (ref 3–28)

## 2013-06-29 LAB — MAGNESIUM: Magnesium: 2.4 mg/dL (ref 1.5–2.5)

## 2013-06-29 LAB — HEMOGLOBIN A1C
Hgb A1c MFr Bld: 7 % — ABNORMAL HIGH (ref <5.7)
Mean Plasma Glucose: 154 mg/dL — ABNORMAL HIGH (ref <117)

## 2013-06-29 LAB — HEPATIC FUNCTION PANEL
ALT: 23 U/L (ref 0–35)
AST: 18 U/L (ref 0–37)
Albumin: 4.6 g/dL (ref 3.5–5.2)
Alkaline Phosphatase: 36 U/L — ABNORMAL LOW (ref 39–117)
Bilirubin, Direct: 0.1 mg/dL (ref 0.0–0.3)
Indirect Bilirubin: 0.2 mg/dL (ref 0.2–1.2)
Total Bilirubin: 0.3 mg/dL (ref 0.2–1.2)
Total Protein: 7.1 g/dL (ref 6.0–8.3)

## 2013-06-29 LAB — IRON AND TIBC
%SAT: 17 % — ABNORMAL LOW (ref 20–55)
Iron: 66 ug/dL (ref 42–145)
TIBC: 400 ug/dL (ref 250–470)
UIBC: 334 ug/dL (ref 125–400)

## 2013-06-29 LAB — MICROALBUMIN / CREATININE URINE RATIO
Creatinine, Urine: 83.3 mg/dL
Microalb Creat Ratio: 40.9 mg/g — ABNORMAL HIGH (ref 0.0–30.0)
Microalb, Ur: 3.41 mg/dL — ABNORMAL HIGH (ref 0.00–1.89)

## 2013-06-29 LAB — VITAMIN D 25 HYDROXY (VIT D DEFICIENCY, FRACTURES): Vit D, 25-Hydroxy: 54 ng/mL (ref 30–89)

## 2013-06-30 LAB — LEVETIRACETAM LEVEL: Keppra (Levetiracetam): 29.9 ug/mL

## 2013-07-02 ENCOUNTER — Other Ambulatory Visit: Payer: Self-pay | Admitting: Internal Medicine

## 2013-07-02 DIAGNOSIS — N3000 Acute cystitis without hematuria: Secondary | ICD-10-CM

## 2013-07-02 MED ORDER — CIPROFLOXACIN HCL 250 MG PO TABS: 250.0000 mg | ORAL_TABLET | Freq: Two times a day (BID) | ORAL | Status: AC

## 2013-07-05 ENCOUNTER — Other Ambulatory Visit: Payer: Self-pay | Admitting: Internal Medicine

## 2013-07-05 DIAGNOSIS — E1149 Type 2 diabetes mellitus with other diabetic neurological complication: Secondary | ICD-10-CM

## 2013-07-05 DIAGNOSIS — G40802 Other epilepsy, not intractable, without status epilepticus: Secondary | ICD-10-CM

## 2013-07-05 MED ORDER — LEVETIRACETAM 500 MG PO TABS: 500.0000 mg | ORAL_TABLET | Freq: Three times a day (TID) | ORAL | Status: DC

## 2013-07-15 ENCOUNTER — Other Ambulatory Visit (INDEPENDENT_AMBULATORY_CARE_PROVIDER_SITE_OTHER): Payer: Medicare Other

## 2013-07-15 DIAGNOSIS — Z1212 Encounter for screening for malignant neoplasm of rectum: Secondary | ICD-10-CM

## 2013-07-15 LAB — POC HEMOCCULT BLD/STL (HOME/3-CARD/SCREEN)
Card #2 Fecal Occult Blod, POC: NEGATIVE
Card #3 Fecal Occult Blood, POC: NEGATIVE
Fecal Occult Blood, POC: NEGATIVE

## 2013-07-22 ENCOUNTER — Other Ambulatory Visit: Payer: Self-pay | Admitting: *Deleted

## 2013-07-22 MED ORDER — ATENOLOL 50 MG PO TABS: 50.0000 mg | ORAL_TABLET | Freq: Every day | ORAL | Status: DC

## 2013-07-26 ENCOUNTER — Ambulatory Visit (INDEPENDENT_AMBULATORY_CARE_PROVIDER_SITE_OTHER): Payer: Medicare Other | Admitting: *Deleted

## 2013-07-26 DIAGNOSIS — N3000 Acute cystitis without hematuria: Secondary | ICD-10-CM

## 2013-07-26 LAB — URINALYSIS, ROUTINE W REFLEX MICROSCOPIC
Bilirubin Urine: NEGATIVE
Glucose, UA: NEGATIVE mg/dL
Hgb urine dipstick: NEGATIVE
Ketones, ur: NEGATIVE mg/dL
Nitrite: NEGATIVE
Protein, ur: NEGATIVE mg/dL
Specific Gravity, Urine: 1.018 (ref 1.005–1.030)
Urobilinogen, UA: 0.2 mg/dL (ref 0.0–1.0)
pH: 5.5 (ref 5.0–8.0)

## 2013-07-26 LAB — URINALYSIS, MICROSCOPIC ONLY
Bacteria, UA: NONE SEEN
Casts: NONE SEEN
Crystals: NONE SEEN
Squamous Epithelial / LPF: NONE SEEN

## 2013-07-26 NOTE — Progress Notes (Signed)
Patient ID: [name] [name], female   DOB: September 09, 1932, 80 y.o.   MRN: [id number] Patient here today for repeat UA, C&S per Dr. Melford Aase orders with positive UTI last month.  Patient states she completed abx as directed. Denies any current UTI symptoms.

## 2013-07-27 LAB — URINE CULTURE: Colony Count: 50000

## 2013-08-08 ENCOUNTER — Other Ambulatory Visit: Payer: Self-pay | Admitting: Internal Medicine

## 2013-09-15 ENCOUNTER — Emergency Department (HOSPITAL_COMMUNITY): Payer: Medicare Other

## 2013-09-15 ENCOUNTER — Encounter (HOSPITAL_COMMUNITY): Payer: Self-pay | Admitting: Emergency Medicine

## 2013-09-15 ENCOUNTER — Inpatient Hospital Stay (HOSPITAL_COMMUNITY)
Admission: EM | Admit: 2013-09-15 | Discharge: 2013-09-20 | DRG: 871 | Disposition: A | Discharge status: Home Health | Payer: Medicare Other | Attending: Internal Medicine | Admitting: Internal Medicine

## 2013-09-15 DIAGNOSIS — D509 Iron deficiency anemia, unspecified: Secondary | ICD-10-CM | POA: Diagnosis present

## 2013-09-15 DIAGNOSIS — Z833 Family history of diabetes mellitus: Secondary | ICD-10-CM

## 2013-09-15 DIAGNOSIS — R0902 Hypoxemia: Secondary | ICD-10-CM

## 2013-09-15 DIAGNOSIS — J189 Pneumonia, unspecified organism: Secondary | ICD-10-CM | POA: Diagnosis present

## 2013-09-15 DIAGNOSIS — E1142 Type 2 diabetes mellitus with diabetic polyneuropathy: Secondary | ICD-10-CM | POA: Diagnosis present

## 2013-09-15 DIAGNOSIS — E119 Type 2 diabetes mellitus without complications: Secondary | ICD-10-CM

## 2013-09-15 DIAGNOSIS — N184 Chronic kidney disease, stage 4 (severe): Secondary | ICD-10-CM

## 2013-09-15 DIAGNOSIS — Z8249 Family history of ischemic heart disease and other diseases of the circulatory system: Secondary | ICD-10-CM

## 2013-09-15 DIAGNOSIS — E785 Hyperlipidemia, unspecified: Secondary | ICD-10-CM | POA: Diagnosis present

## 2013-09-15 DIAGNOSIS — Z823 Family history of stroke: Secondary | ICD-10-CM

## 2013-09-15 DIAGNOSIS — K219 Gastro-esophageal reflux disease without esophagitis: Secondary | ICD-10-CM | POA: Diagnosis present

## 2013-09-15 DIAGNOSIS — G40909 Epilepsy, unspecified, not intractable, without status epilepticus: Secondary | ICD-10-CM | POA: Diagnosis present

## 2013-09-15 DIAGNOSIS — E039 Hypothyroidism, unspecified: Secondary | ICD-10-CM | POA: Diagnosis present

## 2013-09-15 DIAGNOSIS — E872 Acidosis, unspecified: Secondary | ICD-10-CM

## 2013-09-15 DIAGNOSIS — E559 Vitamin D deficiency, unspecified: Secondary | ICD-10-CM | POA: Diagnosis present

## 2013-09-15 DIAGNOSIS — H8109 Meniere's disease, unspecified ear: Secondary | ICD-10-CM | POA: Diagnosis present

## 2013-09-15 DIAGNOSIS — N390 Urinary tract infection, site not specified: Secondary | ICD-10-CM | POA: Diagnosis present

## 2013-09-15 DIAGNOSIS — R6520 Severe sepsis without septic shock: Secondary | ICD-10-CM | POA: Diagnosis present

## 2013-09-15 DIAGNOSIS — E1122 Type 2 diabetes mellitus with diabetic chronic kidney disease: Secondary | ICD-10-CM

## 2013-09-15 DIAGNOSIS — D649 Anemia, unspecified: Secondary | ICD-10-CM | POA: Diagnosis present

## 2013-09-15 DIAGNOSIS — A409 Streptococcal sepsis, unspecified: Secondary | ICD-10-CM | POA: Diagnosis present

## 2013-09-15 DIAGNOSIS — R739 Hyperglycemia, unspecified: Secondary | ICD-10-CM

## 2013-09-15 DIAGNOSIS — N179 Acute kidney failure, unspecified: Secondary | ICD-10-CM | POA: Diagnosis present

## 2013-09-15 DIAGNOSIS — R7881 Bacteremia: Secondary | ICD-10-CM

## 2013-09-15 DIAGNOSIS — E1149 Type 2 diabetes mellitus with other diabetic neurological complication: Secondary | ICD-10-CM | POA: Diagnosis present

## 2013-09-15 DIAGNOSIS — E1129 Type 2 diabetes mellitus with other diabetic kidney complication: Secondary | ICD-10-CM

## 2013-09-15 DIAGNOSIS — R6510 Systemic inflammatory response syndrome (SIRS) of non-infectious origin without acute organ dysfunction: Secondary | ICD-10-CM

## 2013-09-15 DIAGNOSIS — Z8041 Family history of malignant neoplasm of ovary: Secondary | ICD-10-CM | POA: Diagnosis not present

## 2013-09-15 DIAGNOSIS — Z87891 Personal history of nicotine dependence: Secondary | ICD-10-CM | POA: Diagnosis not present

## 2013-09-15 DIAGNOSIS — Z6841 Body Mass Index (BMI) 40.0 and over, adult: Secondary | ICD-10-CM

## 2013-09-15 DIAGNOSIS — Z7982 Long term (current) use of aspirin: Secondary | ICD-10-CM

## 2013-09-15 DIAGNOSIS — A400 Sepsis due to streptococcus, group A: Secondary | ICD-10-CM

## 2013-09-15 DIAGNOSIS — A419 Sepsis, unspecified organism: Secondary | ICD-10-CM | POA: Diagnosis present

## 2013-09-15 DIAGNOSIS — J9600 Acute respiratory failure, unspecified whether with hypoxia or hypercapnia: Secondary | ICD-10-CM | POA: Diagnosis present

## 2013-09-15 DIAGNOSIS — J9601 Acute respiratory failure with hypoxia: Secondary | ICD-10-CM

## 2013-09-15 DIAGNOSIS — R7309 Other abnormal glucose: Secondary | ICD-10-CM | POA: Diagnosis not present

## 2013-09-15 DIAGNOSIS — I959 Hypotension, unspecified: Secondary | ICD-10-CM

## 2013-09-15 DIAGNOSIS — E782 Mixed hyperlipidemia: Secondary | ICD-10-CM

## 2013-09-15 DIAGNOSIS — I129 Hypertensive chronic kidney disease with stage 1 through stage 4 chronic kidney disease, or unspecified chronic kidney disease: Secondary | ICD-10-CM | POA: Diagnosis present

## 2013-09-15 DIAGNOSIS — E1169 Type 2 diabetes mellitus with other specified complication: Secondary | ICD-10-CM | POA: Diagnosis present

## 2013-09-15 HISTORY — DX: Unspecified osteoarthritis, unspecified site: M19.90

## 2013-09-15 HISTORY — DX: Epilepsy, unspecified, not intractable, without status epilepticus: G40.909

## 2013-09-15 HISTORY — DX: Pneumonia, unspecified organism: J18.9

## 2013-09-15 HISTORY — DX: Unspecified malignant neoplasm of skin, unspecified: C44.90

## 2013-09-15 HISTORY — DX: Basal cell carcinoma of skin, unspecified: C44.91

## 2013-09-15 LAB — GLUCOSE, CAPILLARY: Glucose-Capillary: 338 mg/dL — ABNORMAL HIGH (ref 70–99)

## 2013-09-15 LAB — CBC WITH DIFFERENTIAL/PLATELET
Basophils Absolute: 0 10*3/uL (ref 0.0–0.1)
Basophils Relative: 0 % (ref 0–1)
Eosinophils Absolute: 0.2 10*3/uL (ref 0.0–0.7)
Eosinophils Relative: 3 % (ref 0–5)
HCT: 37.2 % (ref 36.0–46.0)
Hemoglobin: 11.8 g/dL — ABNORMAL LOW (ref 12.0–15.0)
Lymphocytes Relative: 6 % — ABNORMAL LOW (ref 12–46)
Lymphs Abs: 0.5 10*3/uL — ABNORMAL LOW (ref 0.7–4.0)
MCH: 29.4 pg (ref 26.0–34.0)
MCHC: 31.7 g/dL (ref 30.0–36.0)
MCV: 92.8 fL (ref 78.0–100.0)
Monocytes Absolute: 0.7 10*3/uL (ref 0.1–1.0)
Monocytes Relative: 9 % (ref 3–12)
Neutro Abs: 6.7 10*3/uL (ref 1.7–7.7)
Neutrophils Relative %: 82 % — ABNORMAL HIGH (ref 43–77)
Platelets: 199 10*3/uL (ref 150–400)
RBC: 4.01 MIL/uL (ref 3.87–5.11)
RDW: 14 % (ref 11.5–15.5)
WBC: 8.1 10*3/uL (ref 4.0–10.5)

## 2013-09-15 LAB — COMPREHENSIVE METABOLIC PANEL
ALT: 26 U/L (ref 0–35)
AST: 24 U/L (ref 0–37)
Albumin: 3.8 g/dL (ref 3.5–5.2)
Alkaline Phosphatase: 41 U/L (ref 39–117)
Anion gap: 17 — ABNORMAL HIGH (ref 5–15)
BUN: 35 mg/dL — ABNORMAL HIGH (ref 6–23)
CO2: 21 mEq/L (ref 19–32)
Calcium: 10.2 mg/dL (ref 8.4–10.5)
Chloride: 99 mEq/L (ref 96–112)
Creatinine, Ser: 1.79 mg/dL — ABNORMAL HIGH (ref 0.50–1.10)
GFR calc Af Amer: 30 mL/min — ABNORMAL LOW (ref >90)
GFR calc non Af Amer: 26 mL/min — ABNORMAL LOW (ref >90)
Glucose, Bld: 238 mg/dL — ABNORMAL HIGH (ref 70–99)
Potassium: 4.7 mEq/L (ref 3.7–5.3)
Sodium: 137 mEq/L (ref 137–147)
Total Bilirubin: 0.2 mg/dL — ABNORMAL LOW (ref 0.3–1.2)
Total Protein: 6.9 g/dL (ref 6.0–8.3)

## 2013-09-15 LAB — URINALYSIS, ROUTINE W REFLEX MICROSCOPIC
Bilirubin Urine: NEGATIVE
Glucose, UA: NEGATIVE mg/dL
Hgb urine dipstick: NEGATIVE
Ketones, ur: NEGATIVE mg/dL
Nitrite: NEGATIVE
Protein, ur: NEGATIVE mg/dL
Specific Gravity, Urine: 1.018 (ref 1.005–1.030)
Urobilinogen, UA: 0.2 mg/dL (ref 0.0–1.0)
pH: 5.5 (ref 5.0–8.0)

## 2013-09-15 LAB — I-STAT CG4 LACTIC ACID, ED: Lactic Acid, Venous: 3.17 mmol/L — ABNORMAL HIGH (ref 0.5–2.2)

## 2013-09-15 LAB — URINE MICROSCOPIC-ADD ON

## 2013-09-15 MED ORDER — FERROUS SULFATE 325 (65 FE) MG PO TABS
325.0000 mg | ORAL_TABLET | Freq: Every day | ORAL | Status: DC
  Administered 2013-09-16 – 2013-09-20 (×5): 325 mg via ORAL
  Filled 2013-09-15 (×6): qty 1

## 2013-09-15 MED ORDER — PANTOPRAZOLE SODIUM 40 MG PO TBEC
40.0000 mg | DELAYED_RELEASE_TABLET | Freq: Every day | ORAL | Status: DC
  Administered 2013-09-16 – 2013-09-20 (×5): 40 mg via ORAL
  Filled 2013-09-15 (×5): qty 1

## 2013-09-15 MED ORDER — ENOXAPARIN SODIUM 30 MG/0.3ML ~~LOC~~ SOLN
30.0000 mg | Freq: Every day | SUBCUTANEOUS | Status: DC
  Administered 2013-09-16 – 2013-09-18 (×3): 30 mg via SUBCUTANEOUS
  Filled 2013-09-15 (×3): qty 0.3

## 2013-09-15 MED ORDER — ONDANSETRON HCL 4 MG/2ML IJ SOLN
4.0000 mg | Freq: Once | INTRAMUSCULAR | Status: AC
  Administered 2013-09-15: 4 mg via INTRAVENOUS
  Filled 2013-09-15: qty 2

## 2013-09-15 MED ORDER — INSULIN ASPART 100 UNIT/ML ~~LOC~~ SOLN
0.0000 [IU] | Freq: Every day | SUBCUTANEOUS | Status: DC
  Administered 2013-09-16: 4 [IU] via SUBCUTANEOUS
  Administered 2013-09-16: 2 [IU] via SUBCUTANEOUS
  Administered 2013-09-17 – 2013-09-18 (×2): 3 [IU] via SUBCUTANEOUS
  Administered 2013-09-19: 2 [IU] via SUBCUTANEOUS

## 2013-09-15 MED ORDER — SODIUM CHLORIDE 0.9 % IV SOLN: INTRAVENOUS | Status: DC

## 2013-09-15 MED ORDER — LEVOTHYROXINE SODIUM 100 MCG PO TABS
100.0000 ug | ORAL_TABLET | Freq: Every day | ORAL | Status: DC
  Administered 2013-09-16 – 2013-09-20 (×5): 100 ug via ORAL
  Filled 2013-09-15 (×6): qty 1

## 2013-09-15 MED ORDER — DEXTROSE 5 % IV SOLN
1.0000 g | Freq: Two times a day (BID) | INTRAVENOUS | Status: DC
  Filled 2013-09-15 (×2): qty 1

## 2013-09-15 MED ORDER — GLIMEPIRIDE 2 MG PO TABS
2.0000 mg | ORAL_TABLET | Freq: Every day | ORAL | Status: DC
  Administered 2013-09-16 – 2013-09-18 (×3): 2 mg via ORAL
  Filled 2013-09-15 (×5): qty 1

## 2013-09-15 MED ORDER — ACETAMINOPHEN 325 MG PO TABS
650.0000 mg | ORAL_TABLET | Freq: Four times a day (QID) | ORAL | Status: DC | PRN
  Administered 2013-09-17 – 2013-09-19 (×5): 650 mg via ORAL

## 2013-09-15 MED ORDER — SODIUM CHLORIDE 0.9 % IV BOLUS (SEPSIS)
30.0000 mL/kg | Freq: Once | INTRAVENOUS | Status: AC
  Administered 2013-09-15: 2886 mL via INTRAVENOUS

## 2013-09-15 MED ORDER — OXYCODONE HCL 5 MG PO TABS: 5.0000 mg | ORAL_TABLET | ORAL | Status: DC | PRN

## 2013-09-15 MED ORDER — ONDANSETRON HCL 4 MG/2ML IJ SOLN: 4.0000 mg | Freq: Four times a day (QID) | INTRAMUSCULAR | Status: DC | PRN

## 2013-09-15 MED ORDER — LORATADINE 10 MG PO TABS
10.0000 mg | ORAL_TABLET | Freq: Every day | ORAL | Status: DC
  Administered 2013-09-16 – 2013-09-20 (×5): 10 mg via ORAL
  Filled 2013-09-15 (×5): qty 1

## 2013-09-15 MED ORDER — INSULIN ASPART 100 UNIT/ML ~~LOC~~ SOLN
0.0000 [IU] | Freq: Three times a day (TID) | SUBCUTANEOUS | Status: DC
  Administered 2013-09-16: 3 [IU] via SUBCUTANEOUS
  Administered 2013-09-16: 2 [IU] via SUBCUTANEOUS
  Administered 2013-09-16: 3 [IU] via SUBCUTANEOUS
  Administered 2013-09-17: 5 [IU] via SUBCUTANEOUS
  Administered 2013-09-17 – 2013-09-18 (×3): 3 [IU] via SUBCUTANEOUS
  Administered 2013-09-18: 7 [IU] via SUBCUTANEOUS
  Administered 2013-09-18 – 2013-09-19 (×2): 3 [IU] via SUBCUTANEOUS
  Administered 2013-09-19: 5 [IU] via SUBCUTANEOUS
  Administered 2013-09-19: 3 [IU] via SUBCUTANEOUS
  Administered 2013-09-20 (×2): 2 [IU] via SUBCUTANEOUS

## 2013-09-15 MED ORDER — ASPIRIN EC 81 MG PO TBEC
81.0000 mg | DELAYED_RELEASE_TABLET | Freq: Every day | ORAL | Status: DC
  Administered 2013-09-16 – 2013-09-20 (×5): 81 mg via ORAL
  Filled 2013-09-15 (×5): qty 1

## 2013-09-15 MED ORDER — ACETAMINOPHEN 650 MG RE SUPP: 650.0000 mg | Freq: Four times a day (QID) | RECTAL | Status: DC | PRN

## 2013-09-15 MED ORDER — SODIUM CHLORIDE 0.9 % IV SOLN
INTRAVENOUS | Status: DC
  Administered 2013-09-15: via INTRAVENOUS

## 2013-09-15 MED ORDER — HYDROMORPHONE HCL PF 1 MG/ML IJ SOLN: 0.5000 mg | INTRAMUSCULAR | Status: DC | PRN

## 2013-09-15 MED ORDER — MAGNESIUM OXIDE 400 (241.3 MG) MG PO TABS
400.0000 mg | ORAL_TABLET | Freq: Three times a day (TID) | ORAL | Status: DC
  Administered 2013-09-16 – 2013-09-20 (×14): 400 mg via ORAL
  Filled 2013-09-15 (×16): qty 1

## 2013-09-15 MED ORDER — ALUM & MAG HYDROXIDE-SIMETH 200-200-20 MG/5ML PO SUSP
30.0000 mL | Freq: Four times a day (QID) | ORAL | Status: DC | PRN
  Filled 2013-09-15: qty 30

## 2013-09-15 MED ORDER — OMEGA-3-ACID ETHYL ESTERS 1 G PO CAPS
2.0000 g | ORAL_CAPSULE | Freq: Every day | ORAL | Status: DC
  Administered 2013-09-16 – 2013-09-19 (×5): 2 g via ORAL
  Filled 2013-09-15 (×6): qty 2

## 2013-09-15 MED ORDER — PNEUMOCOCCAL VAC POLYVALENT 25 MCG/0.5ML IJ INJ
0.5000 mL | INJECTION | INTRAMUSCULAR | Status: AC
  Administered 2013-09-16: 0.5 mL via INTRAMUSCULAR
  Filled 2013-09-15: qty 0.5

## 2013-09-15 MED ORDER — VANCOMYCIN HCL 10 G IV SOLR
1500.0000 mg | Freq: Once | INTRAVENOUS | Status: AC
  Administered 2013-09-16: 1500 mg via INTRAVENOUS
  Filled 2013-09-15: qty 1500

## 2013-09-15 MED ORDER — LEVETIRACETAM 500 MG PO TABS
500.0000 mg | ORAL_TABLET | Freq: Three times a day (TID) | ORAL | Status: DC
  Administered 2013-09-16 – 2013-09-20 (×14): 500 mg via ORAL
  Filled 2013-09-15 (×16): qty 1

## 2013-09-15 MED ORDER — ACETAMINOPHEN 325 MG PO TABS: 650.0000 mg | ORAL_TABLET | Freq: Once | ORAL | Status: DC

## 2013-09-15 MED ORDER — VITAMIN B-12 100 MCG PO TABS
50.0000 ug | ORAL_TABLET | Freq: Every day | ORAL | Status: DC
  Administered 2013-09-16 – 2013-09-20 (×5): 50 ug via ORAL
  Filled 2013-09-15 (×5): qty 1

## 2013-09-15 MED ORDER — SODIUM CHLORIDE 0.9 % IV SOLN
1000.0000 mL | INTRAVENOUS | Status: DC
  Administered 2013-09-15: 1000 mL via INTRAVENOUS

## 2013-09-15 MED ORDER — ONDANSETRON HCL 4 MG PO TABS: 4.0000 mg | ORAL_TABLET | Freq: Four times a day (QID) | ORAL | Status: DC | PRN

## 2013-09-15 MED ORDER — DEXTROSE 5 % IV SOLN: 2.0000 g | INTRAVENOUS | Status: DC

## 2013-09-15 MED ORDER — VITAMIN C 500 MG PO TABS
500.0000 mg | ORAL_TABLET | Freq: Every day | ORAL | Status: DC
  Administered 2013-09-16 – 2013-09-20 (×5): 500 mg via ORAL
  Filled 2013-09-15 (×5): qty 1

## 2013-09-15 MED ORDER — ACETAMINOPHEN 325 MG PO TABS
650.0000 mg | ORAL_TABLET | Freq: Four times a day (QID) | ORAL | Status: DC | PRN
  Administered 2013-09-15: 650 mg via ORAL
  Filled 2013-09-15 (×7): qty 2

## 2013-09-15 MED ORDER — DEXTROSE 5 % IV SOLN
2.0000 g | Freq: Once | INTRAVENOUS | Status: AC
  Administered 2013-09-15: 2 g via INTRAVENOUS
  Filled 2013-09-15: qty 2

## 2013-09-15 NOTE — ED Provider Notes (Signed)
CSN: [id number]     Arrival date & time 09/15/13  1737 History   First MD Initiated Contact with Patient 09/15/13 1753     Chief Complaint  Patient presents with  . Nausea  . Emesis  . Dizziness  . Chills  . Headache     (Consider location/radiation/quality/duration/timing/severity/associated sxs/prior Treatment) Patient is a 80 y.o. female presenting with vomiting, dizziness, and headaches. The history is provided by the patient and the EMS personnel.  Emesis Associated symptoms: headaches   Dizziness Associated symptoms: headaches and vomiting   Headache Associated symptoms: dizziness and vomiting    She began to be ill. Today, at 2 PM, with fever, chills, dizziness, weakness, nausea and vomiting. She denies diarrhea. She has had urinary frequency, without dysuria, for 2 days. She has a headache today, but no neck pain. She's not had these symptoms previously. She is taking her usual medications, without relief.  Past Medical History  Diagnosis Date  . Anemia, unspecified   . Type II or unspecified type diabetes mellitus without mention of complication, not stated as uncontrolled   . Hypertension   . Meniere's disease   . UTI (urinary tract infection)   . High cholesterol   . Seizures   . Other forms of epilepsy and recurrent seizures without mention of intractable epilepsy 11/04/2012    Non convulsive paroxysmal spells, responding to [facility], patient not driving.   Marland Kitchen GERD (gastroesophageal reflux disease)   . Vitamin D deficiency   . Gout   . Hypothyroidism   . Obesity (BMI 30-39.9)   . DM neuropathy, type II diabetes mellitus    Past Surgical History  Procedure Laterality Date  . Tonsillectomy    . Wrist surgery      left  . Cervical polypectomy    . Hammer toe surgery    . Mohs surgery      face   Family History  Problem Relation Age of Onset  . Heart disease Mother   . Stroke Mother   . Heart disease Father   . Ovarian cancer Sister   . Hypertension Son    . Hypertension Son   . Diabetes Son   . Alcohol abuse Son    History  Substance Use Topics  . Smoking status: Former Smoker    Quit date: 06/29/1983  . Smokeless tobacco: Never Used     Comment: 1ppd quit 30 years ago  . Alcohol Use: No     Comment: [email or web address]   OB History   Grav Para Term Preterm Abortions TAB SAB Ect Mult Living                 Review of Systems  Gastrointestinal: Positive for vomiting.  Neurological: Positive for dizziness and headaches.  All other systems reviewed and are negative.     Allergies  Ppd  Home Medications   Prior to Admission medications   Medication Sig Start Date End Date Taking? Authorizing Provider  acetaminophen (TYLENOL) 500 MG tablet Take 1,000 mg by mouth every 6 (six) hours as needed for moderate pain.   Yes Historical Provider, MD  aspirin 81 MG tablet Take 81 mg by mouth daily.   Yes Historical Provider, MD  atenolol (TENORMIN) 50 MG tablet Take 1 tablet (50 mg total) by mouth daily. 07/22/13  Yes Unk Pinto, MD  Biotin 5000 MCG TABS Take 5,000 Units by mouth daily.   Yes Historical Provider, MD  fenofibrate micronized (LOFIBRA) 134 MG capsule Take 134 mg by mouth  at bedtime.   Yes Historical Provider, MD  ferrous sulfate dried (SLOW FE) 160 (50 FE) MG TBCR Take 160 mg by mouth 2 (two) times daily.    Yes Historical Provider, MD  fish oil-omega-3 fatty acids 1000 MG capsule Take 2 g by mouth at bedtime.    Yes Historical Provider, MD  Flaxseed, Linseed, (FLAX SEED OIL) 1000 MG CAPS Take 1 capsule by mouth 3 (three) times daily.   Yes Historical Provider, MD  glimepiride (AMARYL) 4 MG tablet Take 2-4 mg by mouth daily with breakfast.   Yes Historical Provider, MD  lansoprazole (PREVACID) 30 MG capsule Take 1 capsule (30 mg total) by mouth daily. 05/11/13  Yes Unk Pinto, MD  levETIRAcetam (KEPPRA) 500 MG tablet Take 1 tablet (500 mg total) by mouth 3 (three) times daily. After meals. Generic is OK. 07/05/13 07/06/14 Yes  Unk Pinto, MD  levothyroxine (SYNTHROID, LEVOTHROID) 100 MCG tablet Take 1 tablet (100 mcg total) by mouth daily. 02/15/13 02/15/14 Yes Unk Pinto, MD  loratadine (CLARITIN) 10 MG tablet Take 10 mg by mouth daily.   Yes Historical Provider, MD  magnesium oxide (MAG-OX) 400 MG tablet Take 400 mg by mouth 3 (three) times daily.    Yes Historical Provider, MD  meclizine (ANTIVERT) 25 MG tablet Take 25 mg by mouth daily.    Yes Historical Provider, MD  metFORMIN (GLUCOPHAGE) 500 MG tablet Take 500 mg by mouth 2 (two) times daily with a meal.   Yes Historical Provider, MD  vitamin B-12 (CYANOCOBALAMIN) 100 MCG tablet Take 50 mcg by mouth daily.   Yes Historical Provider, MD  vitamin C (ASCORBIC ACID) 500 MG tablet Take 500 mg by mouth daily.   Yes Historical Provider, MD   BP 98/40  Pulse 100  Temp(Src) 99.7 F (37.6 C) (Oral)  Resp 21  Wt 212 lb 1.3 oz (96.2 kg)  SpO2 93% Physical Exam  Nursing note and vitals reviewed. Constitutional: She is oriented to person, place, and time. She appears well-developed and well-nourished. No distress.  HENT:  Head: Normocephalic and atraumatic.  Eyes: Conjunctivae and EOM are normal. Pupils are equal, round, and reactive to light.  Neck: Normal range of motion and phonation normal. Neck supple.  Cardiovascular: Normal rate, regular rhythm and intact distal pulses.   Pulmonary/Chest: Effort normal and breath sounds normal. She exhibits no tenderness.  Abdominal: Soft. She exhibits no distension. There is no tenderness. There is no guarding.  Musculoskeletal: Normal range of motion.  Neurological: She is alert and oriented to person, place, and time. She exhibits normal muscle tone.  No dysarthria, nystagmus or aphasia  Skin: Skin is warm and dry.  Psychiatric: She has a normal mood and affect. Her behavior is normal. Judgment and thought content normal.    ED Course  Procedures (including critical care time) Medications  acetaminophen  (TYLENOL) tablet 650 mg (650 mg Oral Given 09/15/13 1752)  sodium chloride 0.9 % bolus 2,886 mL (0 mLs Intravenous Stopped 09/15/13 1942)    Followed by  0.9 %  sodium chloride infusion (1,000 mLs Intravenous New Bag/Given 09/15/13 1811)  cefTRIAXone (ROCEPHIN) 2 g in dextrose 5 % 50 mL IVPB (not administered)  cefTRIAXone (ROCEPHIN) 2 g in dextrose 5 % 50 mL IVPB (0 g Intravenous Stopped 09/15/13 1908)  ondansetron (ZOFRAN) injection 4 mg (4 mg Intravenous Given 09/15/13 1834)    Patient Vitals for the past 24 hrs:  BP Temp Temp src Pulse Resp SpO2 Weight  09/15/13 2231 98/40 mmHg - - - - - -  09/15/13 2145 101/48 mmHg - - 100 21 93 % -  09/15/13 2130 109/48 mmHg - - 98 23 91 % -  09/15/13 2115 117/54 mmHg - - 100 20 90 % -  09/15/13 2027 - 99.7 F (37.6 C) Oral - - - -  09/15/13 2015 108/45 mmHg - - 101 - 91 % -  09/15/13 2000 117/45 mmHg - - 101 - 92 % -  09/15/13 1945 116/51 mmHg - - 100 24 91 % -  09/15/13 1932 112/48 mmHg - - 99 21 93 % -  09/15/13 1915 108/42 mmHg - - 100 18 97 % -  09/15/13 1900 117/47 mmHg - - 101 22 98 % -  09/15/13 1852 108/42 mmHg - - 102 27 97 % -  09/15/13 1840 107/38 mmHg 101.5 F (38.6 C) Oral 100 20 96 % -  09/15/13 1748 110/42 mmHg 102.3 F (39.1 C) Oral 91 24 93 % 212 lb 1.3 oz (96.2 kg)  09/15/13 1746 - 102.3 F (39.1 C) Oral - - - -    10:15 PM Reevaluation with update and discussion. After initial assessment and treatment, an updated evaluation reveals alert, BP soft, pt eating. Findings discussed with pt and family.Daleen Bo L   10:19 PM-Consult complete with Dr. Arnoldo Morale. Patient case explained and discussed. She agrees to admit patient for further evaluation and treatment. Call ended at 22:35   CRITICAL CARE Performed by: Richarda Blade Total critical care time: 35 minutes Critical care time was exclusive of separately billable procedures and treating other patients. Critical care was necessary to treat or prevent imminent or  life-threatening deterioration. Critical care was time spent personally by me on the following activities: development of treatment plan with patient and/or surrogate as well as nursing, discussions with consultants, evaluation of patient's response to treatment, examination of patient, obtaining history from patient or surrogate, ordering and performing treatments and interventions, ordering and review of laboratory studies, ordering and review of radiographic studies, pulse oximetry and re-evaluation of patient's condition.  Labs Review Labs Reviewed  CBC WITH DIFFERENTIAL - Abnormal; Notable for the following:    Hemoglobin 11.8 (*)    Neutrophils Relative % 82 (*)    Lymphocytes Relative 6 (*)    Lymphs Abs 0.5 (*)    All other components within normal limits  COMPREHENSIVE METABOLIC PANEL - Abnormal; Notable for the following:    Glucose, Bld 238 (*)    BUN 35 (*)    Creatinine, Ser 1.79 (*)    Total Bilirubin 0.2 (*)    GFR calc non Af Amer 26 (*)    GFR calc Af Amer 30 (*)    Anion gap 17 (*)    All other components within normal limits  URINALYSIS, ROUTINE W REFLEX MICROSCOPIC - Abnormal; Notable for the following:    Leukocytes, UA SMALL (*)    All other components within normal limits  I-STAT CG4 LACTIC ACID, ED - Abnormal; Notable for the following:    Lactic Acid, Venous 3.17 (*)    All other components within normal limits  CULTURE, BLOOD (ROUTINE X 2)  CULTURE, BLOOD (ROUTINE X 2)  URINE CULTURE  URINE MICROSCOPIC-ADD ON    Imaging Review Dg Chest Port 1 View  09/15/2013   CLINICAL DATA:  Chills.  EXAM: PORTABLE CHEST - 1 VIEW  COMPARISON:  07/08/2011.  FINDINGS: Mediastinum and hilar structures normal. Mild cardiomegaly, normal pulmonary vascularity. No pleural effusion or pneumothorax. No focal infiltrate. No acute bony abnormality.  IMPRESSION: Mild cardiomegaly, no CHF.  No acute cardiopulmonary disease.   Electronically Signed   By: Marcello Moores  Register   On:  09/15/2013 18:13     EKG Interpretation   Date/Time:  Wednesday September 15 2013 17:48:19 EDT Ventricular Rate:  103 PR Interval:  156 QRS Duration: 131 QT Interval:  366 QTC Calculation: 479 R Axis:   -97 Text Interpretation:  Sinus tachycardia Right bundle branch block Abnormal  lateral Q waves Since last tracing lateral Q waves are new Confirmed by  Eulis Foster  MD, Vira Agar [id number]) on 09/15/2013 9:26:27 PM      MDM   Final diagnoses:  Urinary tract infection without hematuria, site unspecified  Hyperglycemia  Sepsis, due to unspecified organism    Nursing Notes Reviewed/ Care Coordinated, and agree without changes. Applicable Imaging Reviewed.  Interpretation of Laboratory Data incorporated into ED treatment  Plan: Admit    Richarda Blade, MD 09/15/13 2248

## 2013-09-15 NOTE — ED Notes (Signed)
Pt given 4 IV zofran.

## 2013-09-15 NOTE — ED Notes (Signed)
Per [facility], pt from home, around 1400 started feeling N/V, chills, dizziness and headache. Vomited x3. Denies fever. Pt is AAOX4, NAD, no neuro deficits or weakness.

## 2013-09-15 NOTE — ED Notes (Signed)
Spoke with Dr. Eulis Foster about patients BP, no new orders.

## 2013-09-15 NOTE — ED Notes (Signed)
NOTIFIED DR. Eulis Foster IN PERSON FOR PATIENTS PANIC LAB RESULTS OF CG4+ LACTIC ACID =  3.17 MMOl/L@18 :38 pm ,09/15/2013.

## 2013-09-15 NOTE — Progress Notes (Signed)
ANTIBIOTIC CONSULT NOTE - INITIAL  Pharmacy Consult for vancomycin  Indication: sepsis  Allergies  Allergen Reactions  . Ppd [Tuberculin Purified Protein Derivative] Other (See Comments)    indurated    Patient Measurements: Weight: 212 lb 1.3 oz (96.2 kg) Adjusted Body Weight:   Vital Signs: Temp: 99.7 F (37.6 C) (07/29 2027) Temp src: Oral (07/29 2027) BP: 98/40 mmHg (07/29 2231) Pulse Rate: 100 (07/29 2145) Intake/Output from previous day:   Intake/Output from this shift: Total I/O In: 3000 [I.V.:3000] Out: -   Labs:  Recent Labs  09/15/13 1816  WBC 8.1  HGB 11.8*  PLT 199  CREATININE 1.79*   The CrCl is unknown because both a height and weight (above a minimum accepted value) are required for this calculation. No results found for this basename: VANCOTROUGH, VANCOPEAK, VANCORANDOM, GENTTROUGH, GENTPEAK, GENTRANDOM, TOBRATROUGH, TOBRAPEAK, TOBRARND, AMIKACINPEAK, AMIKACINTROU, AMIKACIN,  in the last 72 hours   Microbiology: No results found for this or any previous visit (from the past 720 hour(s)).  Medical History: Past Medical History  Diagnosis Date  . Anemia, unspecified   . Type II or unspecified type diabetes mellitus without mention of complication, not stated as uncontrolled   . Hypertension   . Meniere's disease   . UTI (urinary tract infection)   . High cholesterol   . Seizures   . Other forms of epilepsy and recurrent seizures without mention of intractable epilepsy 11/04/2012    Non convulsive paroxysmal spells, responding to [facility], patient not driving.   Marland Kitchen GERD (gastroesophageal reflux disease)   . Vitamin D deficiency   . Gout   . Hypothyroidism   . Obesity (BMI 30-39.9)   . DM neuropathy, type II diabetes mellitus     Medications:   Assessment: 80 yo female presenting with n/v chills dizziness and HA now believed to be septic. Lactic acid 3.17 sr cr 1.8. Probable ARF.   Goal of Therapy:  vanc 15-20  Plan:  Vancomycin 1500 mg  x1 and f/u trending bmets. For subsequent doses. With current crcl should be thereapeutic ~36 hours   Curlene Dolphin 09/15/2013,10:50 PM

## 2013-09-15 NOTE — H&P (Signed)
[facility] Admission History and Physical       [name] [name] [id number] DOB: 15-Nov-1932 DOA: 09/15/2013  Referring physician: EDP PCP: Alesia Richards, MD  Specialists:   Chief Complaint:  Fever and Chills  HPI: [name] [name] is a 80 y.o. female with a history of DM2, HTN, Hyperlipidemia and Stage IV CKD who presents to the ED with complaints of 2 days of chills then during the day she began to have Nausea and Vomiting x 3 episodes, and then she began to have fevers.    She was found to have a temperature of 102 in the ED, and Blood Cultures were performed and a urine Culture. She denies Chest Pain, and ABD pain.   Her Lactic Acid level was found to be 3.17.    She was referred for medical admission  For SIRS/EArly Sepsis and  placed on IV Vancomycin and Cefepime.       Review of Systems:  Constitutional: No Weight Loss, No Weight Gain, Night Sweats, +Fevers, +Chills, Dizziness, Fatigue, or Generalized Weakness HEENT: No Headaches, Difficulty Swallowing,Tooth/Dental Problems,Sore Throat,  No Sneezing, Rhinitis, Ear Ache, Nasal Congestion, or Post Nasal Drip,  Cardio-vascular:  No Chest pain, Orthopnea, PND, Edema in Lower Extremities, Anasarca, Dizziness, Palpitations  Resp: No Dyspnea, No DOE, No Cough, No Hemoptysis, No Wheezing.    GI: No Heartburn, Indigestion, Abdominal Pain, +Nausea, +Vomiting, Diarrhea, Hematemesis, Hematochezia, Melena, Change in Bowel Habits,  Loss of Appetite  GU: No Dysuria, Change in Color of Urine, No Urgency or +Urinary Frequency, No Flank pain.  Musculoskeletal: No Joint Pain or Swelling, No Decreased Range of Motion, No Back Pain.  Neurologic: No Syncope, No Seizures, Muscle Weakness, Paresthesia, Vision Disturbance or Loss, No Diplopia, No Vertigo, No Difficulty Walking,  Skin: No Rash or Lesions. Psych: No Change in Mood or Affect, No Depression or Anxiety, No Memory loss, No Confusion, or Hallucinations   Past Medical  History  Diagnosis Date  . Anemia, unspecified   . Type II or unspecified type diabetes mellitus without mention of complication, not stated as uncontrolled   . Hypertension   . Meniere's disease   . UTI (urinary tract infection)   . High cholesterol   . Seizures   . Other forms of epilepsy and recurrent seizures without mention of intractable epilepsy 11/04/2012    Non convulsive paroxysmal spells, responding to [facility], patient not driving.   Marland Kitchen GERD (gastroesophageal reflux disease)   . Vitamin D deficiency   . Gout   . Hypothyroidism   . Obesity (BMI 30-39.9)   . DM neuropathy, type II diabetes mellitus     Past Surgical History  Procedure Laterality Date  . Tonsillectomy    . Wrist surgery      left  . Cervical polypectomy    . Hammer toe surgery    . Mohs surgery      face     Prior to Admission medications   Medication Sig Start Date End Date Taking? Authorizing Provider  acetaminophen (TYLENOL) 500 MG tablet Take 1,000 mg by mouth every 6 (six) hours as needed for moderate pain.   Yes Historical Provider, MD  aspirin 81 MG tablet Take 81 mg by mouth daily.   Yes Historical Provider, MD  atenolol (TENORMIN) 50 MG tablet Take 1 tablet (50 mg total) by mouth daily. 07/22/13  Yes Unk Pinto, MD  Biotin 5000 MCG TABS Take 5,000 Units by mouth daily.   Yes Historical Provider, MD  fenofibrate micronized (LOFIBRA)  134 MG capsule Take 134 mg by mouth at bedtime.   Yes Historical Provider, MD  ferrous sulfate dried (SLOW FE) 160 (50 FE) MG TBCR Take 160 mg by mouth 2 (two) times daily.    Yes Historical Provider, MD  fish oil-omega-3 fatty acids 1000 MG capsule Take 2 g by mouth at bedtime.    Yes Historical Provider, MD  Flaxseed, Linseed, (FLAX SEED OIL) 1000 MG CAPS Take 1 capsule by mouth 3 (three) times daily.   Yes Historical Provider, MD  glimepiride (AMARYL) 4 MG tablet Take 2-4 mg by mouth daily with breakfast.   Yes Historical Provider, MD  lansoprazole (PREVACID) 30  MG capsule Take 1 capsule (30 mg total) by mouth daily. 05/11/13  Yes Unk Pinto, MD  levETIRAcetam (KEPPRA) 500 MG tablet Take 1 tablet (500 mg total) by mouth 3 (three) times daily. After meals. Generic is OK. 07/05/13 07/06/14 Yes Unk Pinto, MD  levothyroxine (SYNTHROID, LEVOTHROID) 100 MCG tablet Take 1 tablet (100 mcg total) by mouth daily. 02/15/13 02/15/14 Yes Unk Pinto, MD  loratadine (CLARITIN) 10 MG tablet Take 10 mg by mouth daily.   Yes Historical Provider, MD  magnesium oxide (MAG-OX) 400 MG tablet Take 400 mg by mouth 3 (three) times daily.    Yes Historical Provider, MD  meclizine (ANTIVERT) 25 MG tablet Take 25 mg by mouth daily.    Yes Historical Provider, MD  metFORMIN (GLUCOPHAGE) 500 MG tablet Take 500 mg by mouth 2 (two) times daily with a meal.   Yes Historical Provider, MD  vitamin B-12 (CYANOCOBALAMIN) 100 MCG tablet Take 50 mcg by mouth daily.   Yes Historical Provider, MD  vitamin C (ASCORBIC ACID) 500 MG tablet Take 500 mg by mouth daily.   Yes Historical Provider, MD    Allergies  Allergen Reactions  . Ppd [Tuberculin Purified Protein Derivative] Other (See Comments)    indurated    Social History:  reports that she quit smoking about 30 years ago. She has never used smokeless tobacco. She reports that she does not drink alcohol or use illicit drugs.     Family History  Problem Relation Age of Onset  . Heart disease Mother   . Stroke Mother   . Heart disease Father   . Ovarian cancer Sister   . Hypertension Son   . Hypertension Son   . Diabetes Son   . Alcohol abuse Son       Physical Exam:  GEN:  Pleasant Elderly Obese 80 y.o. Caucasian female examined and in no acute distress; cooperative with exam Filed Vitals:   09/15/13 2130 09/15/13 2145 09/15/13 2231 09/15/13 2257  BP: 109/48 101/48 98/40 108/42  Pulse: 98 100  95  Temp:      TempSrc:      Resp: 23 21  20   Weight:      SpO2: 91% 93%  97%   Blood pressure 108/42, pulse 95,  temperature 99.7 F (37.6 C), temperature source Oral, resp. rate 20, weight 96.2 kg (212 lb 1.3 oz), SpO2 97.00%. PSYCH: She is alert and oriented x4; does not appear anxious does not appear depressed; affect is normal HEENT: Normocephalic and Atraumatic, Mucous membranes pink; PERRLA; EOM intact; Fundi:  Benign;  No scleral icterus, Nares: Patent, Oropharynx: Clear, Fair Dentition, Neck:  FROM, No Cervical Lymphadenopathy nor Thyromegaly or Carotid Bruit; No JVD; Breasts:: Not examined CHEST WALL: No tenderness CHEST: Normal respiration, clear to auscultation bilaterally HEART: Regular rate and rhythm; no murmurs rubs or  gallops BACK: No kyphosis or scoliosis; No CVA tenderness ABDOMEN: Positive Bowel Sounds, Obese, Soft Non-Tender; No Masses, No Organomegaly, No Pannus; No Intertriginous candida. Rectal Exam: Not done EXTREMITIES: No Cyanosis, Clubbing, or Edema; No Ulcerations. Genitalia: not examined PULSES: 2+ and symmetric SKIN: Normal hydration no rash or ulceration CNS: Alert X Oriented X 4, No Focal Deficits Vascular: pulses palpable throughout    Labs on Admission:  Basic Metabolic Panel:  Recent Labs Lab 09/15/13 1816  NA 137  K 4.7  CL 99  CO2 21  GLUCOSE 238*  BUN 35*  CREATININE 1.79*  CALCIUM 10.2   Liver Function Tests:  Recent Labs Lab 09/15/13 1816  AST 24  ALT 26  ALKPHOS 41  BILITOT 0.2*  PROT 6.9  ALBUMIN 3.8   No results found for this basename: LIPASE, AMYLASE,  in the last 168 hours No results found for this basename: AMMONIA,  in the last 168 hours CBC:  Recent Labs Lab 09/15/13 1816  WBC 8.1  NEUTROABS 6.7  HGB 11.8*  HCT 37.2  MCV 92.8  PLT 199   Cardiac Enzymes: No results found for this basename: CKTOTAL, CKMB, CKMBINDEX, TROPONINI,  in the last 168 hours  BNP (last 3 results) No results found for this basename: PROBNP,  in the last 8760 hours CBG: No results found for this basename: GLUCAP,  in the last 168  hours  Radiological Exams on Admission: Dg Chest Port 1 View  09/15/2013   CLINICAL DATA:  Chills.  EXAM: PORTABLE CHEST - 1 VIEW  COMPARISON:  07/08/2011.  FINDINGS: Mediastinum and hilar structures normal. Mild cardiomegaly, normal pulmonary vascularity. No pleural effusion or pneumothorax. No focal infiltrate. No acute bony abnormality.  IMPRESSION: Mild cardiomegaly, no CHF.  No acute cardiopulmonary disease.   Electronically Signed   By: Marcello Moores  Register   On: 09/15/2013 18:13     EKG: Independently reviewed. Sinus Tachycardia, 103 +RBBB   Assessment/Plan:   80 y.o. female with  Principal Problem:   1.  SIRS (systemic inflammatory response syndrome)/Sepsis   Blood and Urine Cultures and  Placed on IV Vancomycin and Cefepime, IVFs.       Active Problems:   2.   Lactic acidosis   Rx in #1, and IVFs, and Holding Metformin Rx.  Recheck lactic Acid in AM.       3.   Hypotension   IVFs, Monitor BPs.   Check Cortisol level.        4.   AKI (acute kidney injury)   IVFs, and Monitor BUN/Cr.        5.   Type II diabetes mellitus with neurological manifestations   Continue Amaryl Rx, Hold Metformin, SSI coverage PRN, and check HbA1c.       6.   CKD Stage 4 due to type 2 diabetes mellitus   Monitor BUN/Cr.        7.   HYPOTHYROIDISM   Continue Levothyroxine, check TSH.       8.   Iron deficiency anemia   Continue Iron Therapy.        9.   Hyperlipidemia   Continue Lofibra, Omega 3 Fatty Acids.     10.   DVT Prophylaxis   Lovenox.          Code Status:  FULL CODE Family Communication:    Family at Bedside Disposition Plan:  Inpatient       Time spent:  [facility] Pager [phone]  If 7AM -7PM Please Contact the Day Rounding Team MD for [facility]  If 7PM-7AM, Please Contact Night- Floor Coverage  [email or web address] Password [facility] 09/15/2013, 11:29 PM

## 2013-09-16 DIAGNOSIS — A409 Streptococcal sepsis, unspecified: Secondary | ICD-10-CM | POA: Diagnosis not present

## 2013-09-16 DIAGNOSIS — N390 Urinary tract infection, site not specified: Secondary | ICD-10-CM

## 2013-09-16 LAB — CBC
HCT: 32 % — ABNORMAL LOW (ref 36.0–46.0)
Hemoglobin: 10.1 g/dL — ABNORMAL LOW (ref 12.0–15.0)
MCH: 29.4 pg (ref 26.0–34.0)
MCHC: 31.6 g/dL (ref 30.0–36.0)
MCV: 93.3 fL (ref 78.0–100.0)
Platelets: 170 10*3/uL (ref 150–400)
RBC: 3.43 MIL/uL — ABNORMAL LOW (ref 3.87–5.11)
RDW: 14.3 % (ref 11.5–15.5)
WBC: 5.6 10*3/uL (ref 4.0–10.5)

## 2013-09-16 LAB — BASIC METABOLIC PANEL
Anion gap: 13 (ref 5–15)
BUN: 28 mg/dL — ABNORMAL HIGH (ref 6–23)
CO2: 21 mEq/L (ref 19–32)
Calcium: 8.8 mg/dL (ref 8.4–10.5)
Chloride: 109 mEq/L (ref 96–112)
Creatinine, Ser: 1.71 mg/dL — ABNORMAL HIGH (ref 0.50–1.10)
GFR calc Af Amer: 32 mL/min — ABNORMAL LOW (ref >90)
GFR calc non Af Amer: 27 mL/min — ABNORMAL LOW (ref >90)
Glucose, Bld: 155 mg/dL — ABNORMAL HIGH (ref 70–99)
Potassium: 4.6 mEq/L (ref 3.7–5.3)
Sodium: 143 mEq/L (ref 137–147)

## 2013-09-16 LAB — HEMOGLOBIN A1C
Hgb A1c MFr Bld: 7.3 % — ABNORMAL HIGH (ref <5.7)
Mean Plasma Glucose: 163 mg/dL — ABNORMAL HIGH (ref <117)

## 2013-09-16 LAB — GLUCOSE, CAPILLARY
Glucose-Capillary: 159 mg/dL — ABNORMAL HIGH (ref 70–99)
Glucose-Capillary: 202 mg/dL — ABNORMAL HIGH (ref 70–99)
Glucose-Capillary: 204 mg/dL — ABNORMAL HIGH (ref 70–99)
Glucose-Capillary: 212 mg/dL — ABNORMAL HIGH (ref 70–99)

## 2013-09-16 LAB — URINE CULTURE
Colony Count: NO GROWTH
Culture: NO GROWTH

## 2013-09-16 LAB — TSH: TSH: 0.951 u[IU]/mL (ref 0.350–4.500)

## 2013-09-16 LAB — CORTISOL: Cortisol, Plasma: 27.7 ug/dL

## 2013-09-16 LAB — MRSA PCR SCREENING: MRSA by PCR: NEGATIVE

## 2013-09-16 MED ORDER — VANCOMYCIN HCL 10 G IV SOLR: 1500.0000 mg | INTRAVENOUS | Status: DC

## 2013-09-16 MED ORDER — VANCOMYCIN HCL IN DEXTROSE 1-5 GM/200ML-% IV SOLN
1000.0000 mg | INTRAVENOUS | Status: DC
  Administered 2013-09-17: 1000 mg via INTRAVENOUS
  Filled 2013-09-16 (×2): qty 200

## 2013-09-16 MED ORDER — CETYLPYRIDINIUM CHLORIDE 0.05 % MT LIQD
7.0000 mL | Freq: Two times a day (BID) | OROMUCOSAL | Status: DC
  Administered 2013-09-16 – 2013-09-19 (×8): 7 mL via OROMUCOSAL
  Filled 2013-09-16 (×2): qty 7

## 2013-09-16 MED ORDER — DEXTROSE 5 % IV SOLN
1.0000 g | INTRAVENOUS | Status: DC
  Administered 2013-09-16: 1 g via INTRAVENOUS
  Filled 2013-09-16: qty 10

## 2013-09-16 NOTE — Progress Notes (Addendum)
[name] Progress Note   [name] [name] [id number] DOB: [birth year] DOA: 09/15/2013 PCP: Alesia Richards, [name]  Brief narrative: [name] [name] is a 80 y.o. female  presents with 2 days of chills and one episode of vomiting on the day of admission. She was found to have lactic [name], [name] [name] UA. Past medical history: CKD  4, diabetes mellitus type 2, hypertension, hypothyroidism seizure disorder and morbid obesity  Subjective: Feels well-  has no complaints of dysuria frequency or blood in the urine- no cough shortness of breath or abdominal pain.  Assessment/Plan: Principal Problem:   SIRS - UA is very minimally [name]-followup on culture-unable to find any other infection that could be the source -No recent tick bites, no new rash -Possibly viral etiology -d/c Vanc and Cefepime and place on Rocephin for possible UTI  Active Problems:   [name] -Hold atenolol and follow    HYPOTHYROIDISM -TSH normal -Continue Synthroid    Iron deficiency anemia -Continue iron   CKD stage 4 due to type 2 diabetes mellitus    Type II diabetes mellitus with neurological manifestations -Metformin on hold -Continue Amaryl -Continue sliding scale insulin  Right shoulder pain -Per patient an MRI was scheduled as an outpatient but she was not able to have it performed- -MRI ordered by admitting doctor-we'll follow   Code Status: Full code  Family Communication: None  Disposition Plan: Home  Consultants: None  Procedures: None  Antibiotics: Anti-infectives   Start     Dose/Rate Route Frequency Ordered Stop   09/17/13 0000  vancomycin (VANCOCIN) 1,500 mg in sodium chloride 0.9 % 500 mL IVPB  Status:  Discontinued     1,500 mg 250 mL/hr over 120 Minutes Intravenous Every 24 hours 09/16/13 0815 09/16/13 0834   09/16/13 2000  cefTRIAXone (ROCEPHIN) 2 g in dextrose 5 % 50 mL IVPB  Status:  Discontinued     2 g 100 mL/hr over 30  Minutes Intravenous Every 24 hours 09/15/13 1801 09/15/13 2341   09/16/13 1800  cefTRIAXone (ROCEPHIN) 1 g in dextrose 5 % 50 mL IVPB     1 g 100 mL/hr over 30 Minutes Intravenous Every 24 hours 09/16/13 0835     09/16/13 1000  ceFEPIme (MAXIPIME) 1 g in dextrose 5 % 50 mL IVPB  Status:  Discontinued     1 g 100 mL/hr over 30 Minutes Intravenous Every 12 hours 09/15/13 2316 09/16/13 0835   09/15/13 2300  vancomycin (VANCOCIN) 1,500 mg in sodium chloride 0.9 % 500 mL IVPB     1,500 mg 250 mL/hr over 120 Minutes Intravenous  Once 09/15/13 2254 09/16/13 0235   09/15/13 1800  cefTRIAXone (ROCEPHIN) 2 g in dextrose 5 % 50 mL IVPB     2 g 100 mL/hr over 30 Minutes Intravenous  Once 09/15/13 1758 09/15/13 1908       DVT prophylaxis: Lovenox  Objective: Filed Weights   09/15/13 1748 09/15/13 2340  Weight: 96.2 kg (212 lb 1.3 oz) 101.3 kg (223 lb 5.2 oz)    Intake/Output Summary (Last 24 hours) at 09/16/13 1333 Last data filed at 09/16/13 0905  Gross per 24 hour  Intake 3811.25 ml  Output      2 ml  Net 3809.25 ml     Vitals Filed Vitals:   09/15/13 2257 09/15/13 2340 09/16/13 0343 09/16/13 1012  BP: 108/42 95/59 110/69 124/65  Pulse: 95 91 93 84  Temp:  98.7 F (37.1 C) 99 F (37.2 C) 99.5  F (37.5 C)  TempSrc:  Oral Oral Oral  Resp: 20 18 18 18   Height: 5' 2.6" (1.59 m) 5\' 2"  (1.575 m)    Weight:  101.3 kg (223 lb 5.2 oz)    SpO2: 97% 96% 93% 96%    Exam: General: No acute respiratory distress Lungs: Clear to auscultation bilaterally without wheezes or crackles Cardiovascular: Regular rate and rhythm without murmur gallop or rub normal S1 and S2 Abdomen: Nontender, nondistended, soft, bowel sounds [name], no rebound, no ascites, no appreciable mass Extremities: No significant cyanosis, clubbing, or edema bilateral lower extremities  Data Reviewed: Basic Metabolic Panel:  Recent Labs Lab 09/15/13 1816 09/16/13 0615  NA 137 143  K 4.7 4.6  CL 99 109  CO2 21  21  GLUCOSE 238* 155*  BUN 35* 28*  CREATININE 1.79* 1.71*  CALCIUM 10.2 8.8   Liver Function Tests:  Recent Labs Lab 09/15/13 1816  AST 24  ALT 26  ALKPHOS 41  BILITOT 0.2*  PROT 6.9  ALBUMIN 3.8   No results found for this basename: LIPASE, AMYLASE,  in the last 168 hours No results found for this basename: AMMONIA,  in the last 168 hours CBC:  Recent Labs Lab 09/15/13 1816 09/16/13 0615  WBC 8.1 5.6  NEUTROABS 6.7  --   HGB 11.8* 10.1*  HCT 37.2 32.0*  MCV 92.8 93.3  PLT 199 170   Cardiac Enzymes: No results found for this basename: CKTOTAL, CKMB, CKMBINDEX, TROPONINI,  in the last 168 hours BNP (last 3 results) No results found for this basename: PROBNP,  in the last 8760 hours CBG:  Recent Labs Lab 09/15/13 2337 09/16/13 0756 09/16/13 1155  GLUCAP 338* 159* 202*    Recent Results (from the past 240 hour(s))  CULTURE, BLOOD (ROUTINE X 2)     Status: None   Collection Time    09/15/13  6:17 PM      Result Value Ref Range Status   Specimen Description BLOOD RIGHT FOREARM   Final   Special Requests BOTTLES DRAWN AEROBIC AND ANAEROBIC 5CC   Final   Culture  Setup Time     Final   Value: 09/15/2013 21:54     Performed at [facility]   Culture     Final   Value:        BLOOD CULTURE RECEIVED NO GROWTH TO DATE CULTURE WILL BE HELD FOR 5 DAYS BEFORE ISSUING A FINAL NEGATIVE REPORT     Performed at [facility]   Report Status PENDING   Incomplete  CULTURE, BLOOD (ROUTINE X 2)     Status: None   Collection Time    09/15/13  6:20 PM      Result Value Ref Range Status   Specimen Description BLOOD RIGHT HAND   Final   Special Requests BOTTLES DRAWN AEROBIC ONLY 10CC   Final   Culture  Setup Time     Final   Value: 09/15/2013 21:54     Performed at [facility]   Culture     Final   Value:        BLOOD CULTURE RECEIVED NO GROWTH TO DATE CULTURE WILL BE HELD FOR 5 DAYS BEFORE ISSUING A FINAL NEGATIVE REPORT     Performed at  [facility]   Report Status PENDING   Incomplete  MRSA PCR SCREENING     Status: None   Collection Time    09/16/13  1:00 AM  Result Value Ref Range Status   MRSA by PCR NEGATIVE  NEGATIVE Final   Comment:            The GeneXpert MRSA Assay (FDA     approved for NASAL specimens     only), is one component of a     comprehensive MRSA colonization     surveillance program. It is not     intended to diagnose MRSA     infection nor to guide or     monitor treatment for     MRSA infections.     Studies:  Recent x-ray studies have been reviewed in detail by the Attending Physician  Scheduled Meds:  Scheduled Meds: . antiseptic oral rinse  7 mL Mouth Rinse BID  . aspirin EC  81 mg Oral Daily  . cefTRIAXone (ROCEPHIN)  IV  1 g Intravenous Q24H  . enoxaparin (LOVENOX) injection  30 mg Subcutaneous Daily  . ferrous sulfate  325 mg Oral Q breakfast  . glimepiride  2 mg Oral Q breakfast  . insulin aspart  0-5 Units Subcutaneous QHS  . insulin aspart  0-9 Units Subcutaneous TID WC  . levETIRAcetam  500 mg Oral TID  . levothyroxine  100 mcg Oral QAC breakfast  . loratadine  10 mg Oral Daily  . magnesium oxide  400 mg Oral TID  . omega-3 acid ethyl esters  2 g Oral QHS  . pantoprazole  40 mg Oral Daily  . vitamin B-12  50 mcg Oral Daily  . vitamin C  500 mg Oral Daily   Continuous Infusions: . sodium chloride 75 mL/hr at 09/15/13 2333    Time spent on care of this patient: 21 minute   Kendall, [name] 09/16/2013, 1:33 PM  LOS: 1 day   [name]  [phone] Pager - Text Page per [email or web address]  If 7PM-7AM, please contact night-coverage [email or web address]

## 2013-09-16 NOTE — Progress Notes (Signed)
ANTIBIOTIC CONSULT NOTE - INITIAL  Pharmacy Consult for vancomycin  Indication: sepsis  Allergies  Allergen Reactions  . Ppd [Tuberculin Purified Protein Derivative] Other (See Comments)    indurated    Patient Measurements: Height: 5\' 2"  (157.5 cm) Weight: 223 lb 5.2 oz (101.3 kg) IBW/kg (Calculated) : 50.1 Adjusted Body Weight:   Vital Signs: Temp: 98.9 F (37.2 C) (07/30 1300) Temp src: Oral (07/30 1300) BP: 112/66 mmHg (07/30 1300) Pulse Rate: 95 (07/30 1300) Intake/Output from previous day: 07/29 0701 - 07/30 0700 In: 3495 [I.V.:3495] Out: 2 [Urine:2] Intake/Output from this shift: Total I/O In: 316.3 [P.O.:240; I.V.:76.3] Out: -   Labs:  Recent Labs  09/15/13 1816 09/16/13 0615  WBC 8.1 5.6  HGB 11.8* 10.1*  PLT 199 170  CREATININE 1.79* 1.71*   Estimated Creatinine Clearance: 30.2 ml/min (by C-G formula based on Cr of 1.71). No results found for this basename: VANCOTROUGH, Corlis Leak, VANCORANDOM, GENTTROUGH, GENTPEAK, GENTRANDOM, TOBRATROUGH, TOBRAPEAK, TOBRARND, AMIKACINPEAK, AMIKACINTROU, AMIKACIN,  in the last 72 hours   Microbiology: Recent Results (from the past 720 hour(s))  CULTURE, BLOOD (ROUTINE X 2)     Status: None   Collection Time    09/15/13  6:17 PM      Result Value Ref Range Status   Specimen Description BLOOD RIGHT FOREARM   Final   Special Requests BOTTLES DRAWN AEROBIC AND ANAEROBIC 5CC   Final   Culture  Setup Time     Final   Value: 09/15/2013 21:54     Performed at [facility]   Culture     Final   Value: GRAM POSITIVE COCCI IN PAIRS AND CHAINS     Note: Gram Stain Report Called to,Read Back By and Verified With: JENNIFER VHU 09/16/13 1440 BY SMITHERSJ     Performed at [facility]   Report Status PENDING   Incomplete  CULTURE, BLOOD (ROUTINE X 2)     Status: None   Collection Time    09/15/13  6:20 PM      Result Value Ref Range Status   Specimen Description BLOOD RIGHT HAND   Final   Special Requests  BOTTLES DRAWN AEROBIC ONLY 10CC   Final   Culture  Setup Time     Final   Value: 09/15/2013 21:54     Performed at [facility]   Culture     Final   Value:        BLOOD CULTURE RECEIVED NO GROWTH TO DATE CULTURE WILL BE HELD FOR 5 DAYS BEFORE ISSUING A FINAL NEGATIVE REPORT     Performed at [facility]   Report Status PENDING   Incomplete  MRSA PCR SCREENING     Status: None   Collection Time    09/16/13  1:00 AM      Result Value Ref Range Status   MRSA by PCR NEGATIVE  NEGATIVE Final   Comment:            The GeneXpert MRSA Assay (FDA     approved for NASAL specimens     only), is one component of a     comprehensive MRSA colonization     surveillance program. It is not     intended to diagnose MRSA     infection nor to guide or     monitor treatment for     MRSA infections.    Medical History: Past Medical History  Diagnosis Date  . Anemia, unspecified   . Meniere's  disease   . UTI (urinary tract infection)   . High cholesterol   . Other forms of epilepsy and recurrent seizures without mention of intractable epilepsy 11/04/2012    Non convulsive paroxysmal spells, responding to [facility], patient not driving.   Marland Kitchen GERD (gastroesophageal reflux disease)   . Vitamin D deficiency   . Gout   . Hypothyroidism   . Obesity (BMI 30-39.9)   . Hypertension   . Pneumonia ~ 1943  . Type II or unspecified type diabetes mellitus without mention of complication, not stated as uncontrolled   . DM neuropathy, type II diabetes mellitus   . Epilepsy   . Arthritis     "knees; right shoulder" (09/15/2013)  . Basal cell carcinoma     "right upper outer lip"  . Skin cancer     "forehead; right hand"    Medications:   Assessment: 80 yo female presenting with n/v chills dizziness and HA, 1/2 blood culture is positive for GPC in pairs and chains. She is on rocephin, now to add vancomycin. Noted pt. Received one dose of vancomycin 1500 mg this morning at 0030. She also has  CKD, scr 1.7 with est. crcl ~ 30 ml/min.  Goal of Therapy:  vanc 15-20  Plan:  Vancomycin 1g IV Q 24 hrs next dose tomorrow at 0200 F/u cultures and renal function Vancomycin trough at steady state  Maryanna Shape, PharmD, BCPS  Clinical Pharmacist  Pager: [phone]   09/16/2013,3:18 PM

## 2013-09-16 NOTE — Progress Notes (Signed)
Utilization review completed. Daelyn Pettaway, RN, BSN. 

## 2013-09-16 NOTE — Progress Notes (Signed)
CRITICAL VALUE ALERT  Critical value received:  Blood cultures positive in aerobic bottle; cocci in clusters  Date of notification:  09/16/13  Time of notification:  [id number]  Critical value read back:Yes.    Nurse who received alert:  Zenon Mayo, RN  MD notified (1st page):  Dr. Wynelle Cleveland  Time of first page:  [phone]

## 2013-09-16 NOTE — Progress Notes (Signed)
Pt arrive to unit in no s/s of distress. Pt A&Ox4. Pt oriented to room and unit. Family members at bedside. VS stable. BP low - physician previously made aware in the ED. Whiteboard updated. Report received from ED nurse prior to pt's arrival. Callbell within reach. Will continue to monitor.

## 2013-09-16 NOTE — Progress Notes (Signed)
   [facility]       ADDENDUM NOTE:      Assessment/Plan: 80 y.o. female with  Principal Problem:  1. SIRS (systemic inflammatory response syndrome)/Sepsis    Blood and Urine Cultures and Placed on IV Vancomycin and Cefepime, IVFs.    2. Lactic acidosis    Rx in #1, and IVFs, and Holding Metformin Rx. Recheck lactic Acid in AM.    3. Hypotension    IVFs, Monitor BPs. Check Cortisol level.    4. AKI (acute kidney injury)    IVFs, and Monitor BUN/Cr.    5. Type II diabetes mellitus with neurological manifestations    Continue Amaryl Rx, Hold Metformin, SSI coverage PRN, and check HbA1c.    6. CKD Stage 4 due to type 2 diabetes mellitus    Monitor BUN/Cr.    7. HYPOTHYROIDISM    Continue Levothyroxine, check TSH.    8. Iron deficiency anemia    Continue Iron Therapy.    9. Hyperlipidemia    Continue Lofibra, Omega 3 Fatty Acids.    10. DVT Prophylaxis    Lovenox.    ADDENDUM:   11. Other- Increased Pain to Right Shoulder x 6 months ,    MRI of Right Shoulder to Rule out Rotator Cuff Tear.       Theressa Millard [facility] Pager [phone]   If [place] Please Contact the Day Rounding Team MD for [facility]  If 7PM-7AM, Please Contact night-coverage  [email or web address] Password [email or web address] 09/16/2013, 8:19 AM

## 2013-09-17 ENCOUNTER — Inpatient Hospital Stay (HOSPITAL_COMMUNITY): Payer: Medicare Other

## 2013-09-17 DIAGNOSIS — R7881 Bacteremia: Secondary | ICD-10-CM

## 2013-09-17 DIAGNOSIS — R0902 Hypoxemia: Secondary | ICD-10-CM

## 2013-09-17 LAB — GLUCOSE, CAPILLARY
Glucose-Capillary: 220 mg/dL — ABNORMAL HIGH (ref 70–99)
Glucose-Capillary: 244 mg/dL — ABNORMAL HIGH (ref 70–99)
Glucose-Capillary: 271 mg/dL — ABNORMAL HIGH (ref 70–99)
Glucose-Capillary: 256 mg/dL — ABNORMAL HIGH (ref 70–99)

## 2013-09-17 MED ORDER — METFORMIN HCL 500 MG PO TABS: 500.0000 mg | ORAL_TABLET | Freq: Two times a day (BID) | ORAL | Status: DC

## 2013-09-17 MED ORDER — FUROSEMIDE 10 MG/ML IJ SOLN
20.0000 mg | Freq: Two times a day (BID) | INTRAMUSCULAR | Status: DC
  Administered 2013-09-17 – 2013-09-18 (×3): 20 mg via INTRAVENOUS
  Filled 2013-09-17 (×4): qty 2

## 2013-09-17 MED ORDER — ATENOLOL 50 MG PO TABS
50.0000 mg | ORAL_TABLET | ORAL | Status: DC
  Administered 2013-09-17 – 2013-09-19 (×3): 50 mg via ORAL
  Filled 2013-09-17 (×4): qty 1

## 2013-09-17 MED ORDER — AMOXICILLIN 500 MG PO CAPS
500.0000 mg | ORAL_CAPSULE | Freq: Two times a day (BID) | ORAL | Status: DC
  Administered 2013-09-17 – 2013-09-18 (×3): 500 mg via ORAL
  Filled 2013-09-17 (×5): qty 1

## 2013-09-17 NOTE — Progress Notes (Signed)
ANTIBIOTIC CONSULT NOTE - INITIAL  Pharmacy Consult for amoxicillin Indication: sepsis  Allergies  Allergen Reactions  . Ppd [Tuberculin Purified Protein Derivative] Other (See Comments)    indurated    Patient Measurements: Height: 5\' 2"  (157.5 cm) Weight: 223 lb 5.2 oz (101.3 kg) IBW/kg (Calculated) : 50.1 Adjusted Body Weight:   Vital Signs: Temp: 98.9 F (37.2 C) (07/31 1753) Temp src: Oral (07/31 1753) BP: 134/79 mmHg (07/31 1753) Pulse Rate: 86 (07/31 1753) Intake/Output from previous day: 07/30 0701 - 07/31 0700 In: 366.3 [P.O.:240; I.V.:76.3; IV Piggyback:50] Out: -  Intake/Output from this shift: Total I/O In: 720 [P.O.:720] Out: -   Labs:  Recent Labs  09/15/13 1816 09/16/13 0615  WBC 8.1 5.6  HGB 11.8* 10.1*  PLT 199 170  CREATININE 1.79* 1.71*   Estimated Creatinine Clearance: 30.2 ml/min (by C-G formula based on Cr of 1.71). No results found for this basename: VANCOTROUGH, Corlis Leak, VANCORANDOM, [place], GENTPEAK, GENTRANDOM, TOBRATROUGH, TOBRAPEAK, TOBRARND, AMIKACINPEAK, AMIKACINTROU, AMIKACIN,  in the last 72 hours   Microbiology: Recent Results (from the past 720 hour(s))  CULTURE, BLOOD (ROUTINE X 2)     Status: None   Collection Time    09/15/13  6:17 PM      Result Value Ref Range Status   Specimen Description BLOOD RIGHT FOREARM   Final   Special Requests BOTTLES DRAWN AEROBIC AND ANAEROBIC 5CC   Final   Culture  Setup Time     Final   Value: 09/15/2013 21:54     Performed at [facility]   Culture     Final   Value: GROUP B STREP(S.AGALACTIAE)ISOLATED     Note: Gram Stain Report Called to,Read Back By and Verified With: JENNIFER VHU 09/16/13 1440 BY SMITHERSJ     Performed at [facility]   Report Status PENDING   Incomplete  CULTURE, BLOOD (ROUTINE X 2)     Status: None   Collection Time    09/15/13  6:20 PM      Result Value Ref Range Status   Specimen Description BLOOD RIGHT HAND   Final   Special Requests  BOTTLES DRAWN AEROBIC ONLY 10CC   Final   Culture  Setup Time     Final   Value: 09/15/2013 21:54     Performed at [facility]   Culture     Final   Value:        BLOOD CULTURE RECEIVED NO GROWTH TO DATE CULTURE WILL BE HELD FOR 5 DAYS BEFORE ISSUING A FINAL NEGATIVE REPORT     Performed at [facility]   Report Status PENDING   Incomplete  URINE CULTURE     Status: None   Collection Time    09/15/13  6:36 PM      Result Value Ref Range Status   Specimen Description URINE, CATHETERIZED   Final   Special Requests NONE   Final   Culture  Setup Time     Final   Value: 09/15/2013 21:58     Performed at [place]     Final   Value: NO GROWTH     Performed at [facility]   Culture     Final   Value: NO GROWTH     Performed at [facility]   Report Status 09/16/2013 FINAL   Final  MRSA PCR SCREENING     Status: None   Collection Time    09/16/13  1:00 AM      Result Value Ref Range Status   MRSA by PCR NEGATIVE  NEGATIVE Final   Comment:            The GeneXpert MRSA Assay (FDA     approved for NASAL specimens     only), is one component of a     comprehensive MRSA colonization     surveillance program. It is not     intended to diagnose MRSA     infection nor to guide or     monitor treatment for     MRSA infections.    Medical History: Past Medical History  Diagnosis Date  . Anemia, unspecified   . Meniere's disease   . UTI (urinary tract infection)   . High cholesterol   . Other forms of epilepsy and recurrent seizures without mention of intractable epilepsy 11/04/2012    Non convulsive paroxysmal spells, responding to [facility], patient not driving.   Marland Kitchen GERD (gastroesophageal reflux disease)   . Vitamin D deficiency   . Gout   . Hypothyroidism   . Obesity (BMI 30-39.9)   . Hypertension   . Pneumonia ~ 1943  . Type II or unspecified type diabetes mellitus without mention of complication, not stated as  uncontrolled   . DM neuropathy, type II diabetes mellitus   . Epilepsy   . Arthritis     "knees; right shoulder" (09/15/2013)  . Basal cell carcinoma     "right upper outer lip"  . Skin cancer     "forehead; right hand"    Medications:   Assessment: 80 yo female presenting with n/v chills dizziness and HA, 1/2 blood culture is positive for GPC in pairs and chains. She has had a hx CKD and scr has been >1.5. The blood culture has come back with group B strep. MD has changed abx to amoxicillin.   Plan:   Dc vanc Amoxicillin 500mg  PO BID

## 2013-09-17 NOTE — Progress Notes (Signed)
PHARMACIST - PHYSICIAN COMMUNICATION  CONCERNING:  METFORMIN SAFE ADMINISTRATION POLICY  RECOMMENDATION:  Metformin has been placed on DISCONTINUE (rejected order) STATUS and should be reordered only after any of the conditions below are ruled out.  Current safety recommendations include avoiding metformin for a minimum of 48 hours after the patient's exposure to intravenous contrast media.  DESCRIPTION:  The Pharmacy Committee has adopted a policy that restricts the use of metformin in hospitalized patients until all the contraindications to administration have been ruled out. Specific contraindications are: []  Serum creatinine ? 1.5 for males [x]  Serum creatinine ? 1.4 for females []  Shock, acute MI, sepsis, hypoxemia, dehydration []  Planned administration of intravenous iodinated contrast media []  Heart Failure patients with low EF []  Acute or chronic metabolic acidosis (including DKA)        Hughes Better, PharmD, BCPS Clinical Pharmacist Pager: [phone] 09/17/2013 3:43 PM

## 2013-09-17 NOTE — Progress Notes (Signed)
Inpatient Diabetes Program Recommendations  AACE/ADA: New Consensus Statement on Inpatient Glycemic Control (2013)  Target Ranges:  Prepandial:   less than 140 mg/dL      Peak postprandial:   less than 180 mg/dL (1-2 hours)      Critically ill patients:  140 - 180 mg/dL   Reason for Visit: Hyperglycemia  Diabetes history: DM2 Outpatient Diabetes medications: amaryl 4 mg QD and metformin 500 bid Current orders for Inpatient glycemic control: Novolog sensitive tidwc and hs  Results for [name] [name], [name] [name] (MRN [id number]) as of 09/17/2013 12:39  Ref. Range 09/16/2013 11:55 09/16/2013 16:53 09/16/2013 20:25 09/17/2013 07:55 09/17/2013 12:14  Glucose-Capillary Latest Range: 70-99 mg/dL 202 (H) 204 (H) 212 (H) 220 (H) 244 (H)  Results for [name] [name], [name] [name] (MRN [id number]) as of 09/17/2013 12:39  Ref. Range 09/16/2013 06:15  Hemoglobin A1C Latest Range: <5.7 % 7.3 (H)    Blood sugars running high. May benefit from adding Lantus and increasing correction.  Recommendations: Consider adding Lantus 12 units QHS. (Will not likely need at discharge, only during this acute phase) Increase Novolog to moderate tidwc and hs.  Will continue to follow. Thank you. Lorenda Peck, RD, LDN, CDE Inpatient Diabetes Coordinator [phone]

## 2013-09-17 NOTE — Progress Notes (Signed)
CRITICAL VALUE ALERT  Critical value received: gram + anaerobic cocci  Date of notification:  09/17/2013  Time of notification:  7:02am  Critical value read back:Yes.    Nurse who received alert:  Merri Ray  MD notified (1st page):  Rizwan,MD  Time of first page:  7:04am  MD notified (2nd page):  Time of second page:  Responding MD:    Time MD responded:

## 2013-09-17 NOTE — Care Management Note (Signed)
    Page 1 of 1   09/20/2013     3:54:11 PM CARE MANAGEMENT NOTE 09/20/2013  Patient:  [name] [name], [name] [name]   Account Number:  [id number]  Date Initiated:  09/17/2013  Documentation initiated by:  [name]  Subjective/Objective Assessment:   dx n/v, chills  admit- from [facility]- indep living.     Action/Plan:   Anticipated DC Date:  09/20/2013   Anticipated DC Plan:  [place]  CM consult      Choice offered to / List presented to:             Status of service:  Completed, signed off Medicare Important Message given?  YES (If response is "NO", the following Medicare IM given date fields will be blank) Date Medicare IM given:  09/17/2013 Medicare IM given by:  [name] Date Additional Medicare IM given:  09/20/2013 Additional Medicare IM given by:  [name]  Discharge Disposition:  HOME/SELF CARE  Per UR Regulation:  Reviewed for med. necessity/level of care/duration of stay  If discussed at [place] of Stay Meetings, dates discussed:    Comments:  09/20/13 [place], BSN patient dc to [facility], indep living, no needs anticipated.  09/18/13 17:00 LATE ENTRY: CM called MD as [facility] states [name] chronic condition must be documented for pt's insurance to cover oxygen.  [name], BSN. CM [id number].  09/17/13 [place], BSN [phone] patient is from [name] living.  NCM will continue to follow for dc needs.

## 2013-09-17 NOTE — Progress Notes (Addendum)
[facility] Progress Note   [name] [name] [id number] DOB: 06/10/1932 DOA: 09/15/2013 PCP: Alesia Richards, MD  Brief narrative: [name] [name] is a 80 y.o. female  presents with 2 days of chills and one episode of vomiting on the day of admission. She was found to have lactic acidosis, hypotension and mildly positive UA. Past medical history: CKD  4, diabetes mellitus type 2, hypertension, hypothyroidism seizure disorder and morbid obesity  Subjective: Feels well-  has no complaints of dysuria frequency or blood in the urine- no cough shortness of breath or abdominal pain.  Assessment/Plan: Principal Problem: Sepsis- gp A strep bacteremia- UTI - UA is very minimally positive-negative culture- Blood cx 1 set positive for Strep Group A- d/w Dr Megan Salon (ID)- will start Amoxil- no clinical evidence of endocarditis but will check an ECHO -No recent tick bites, no new rash   Active Problems:   Hypotension -resolved- resume atenolol and follow  Crackles in RLL- hypoxia - CXR reveals pulm edema vs bronchitis no symptoms of bronchitis-  will start give a small dose of Lasix as she is requiring O2 and was not on O2 at home    HYPOTHYROIDISM -TSH normal -Continue Synthroid    Iron deficiency anemia -Continue iron   CKD stage 4 due to type 2 diabetes mellitus    Type II diabetes mellitus with neurological manifestations -Metformin on hold- will resume today -Continue Amaryl -Continue sliding scale insulin  Right shoulder pain -Per patient an MRI was scheduled as an outpatient but she was not able to have it performed- -MRI ordered by admitting doctor-we'll follow   Code Status: Full code  Family Communication: None  Disposition Plan: Home  Consultants: None  Procedures: None  Antibiotics: Anti-infectives   Start     Dose/Rate Route Frequency Ordered Stop   09/17/13 0200  vancomycin (VANCOCIN) IVPB 1000 mg/200 mL premix     1,000 mg 200 mL/hr  over 60 Minutes Intravenous Every 24 hours 09/16/13 1529     09/17/13 0000  vancomycin (VANCOCIN) 1,500 mg in sodium chloride 0.9 % 500 mL IVPB  Status:  Discontinued     1,500 mg 250 mL/hr over 120 Minutes Intravenous Every 24 hours 09/16/13 0815 09/16/13 0834   09/16/13 2000  cefTRIAXone (ROCEPHIN) 2 g in dextrose 5 % 50 mL IVPB  Status:  Discontinued     2 g 100 mL/hr over 30 Minutes Intravenous Every 24 hours 09/15/13 1801 09/15/13 2341   09/16/13 1800  cefTRIAXone (ROCEPHIN) 1 g in dextrose 5 % 50 mL IVPB  Status:  Discontinued     1 g 100 mL/hr over 30 Minutes Intravenous Every 24 hours 09/16/13 0835 09/17/13 0731   09/16/13 1000  ceFEPIme (MAXIPIME) 1 g in dextrose 5 % 50 mL IVPB  Status:  Discontinued     1 g 100 mL/hr over 30 Minutes Intravenous Every 12 hours 09/15/13 2316 09/16/13 0835   09/15/13 2300  vancomycin (VANCOCIN) 1,500 mg in sodium chloride 0.9 % 500 mL IVPB     1,500 mg 250 mL/hr over 120 Minutes Intravenous  Once 09/15/13 2254 09/16/13 0235   09/15/13 1800  cefTRIAXone (ROCEPHIN) 2 g in dextrose 5 % 50 mL IVPB     2 g 100 mL/hr over 30 Minutes Intravenous  Once 09/15/13 1758 09/15/13 1908       DVT prophylaxis: Lovenox  Objective: Filed Weights   09/15/13 1748 09/15/13 2340  Weight: 96.2 kg (212 lb 1.3 oz) 101.3 kg (223 lb 5.2  oz)    Intake/Output Summary (Last 24 hours) at 09/17/13 1200 Last data filed at 09/17/13 0900  Gross per 24 hour  Intake    290 ml  Output      0 ml  Net    290 ml     Vitals Filed Vitals:   09/17/13 [id number] 09/17/13 0951 09/17/13 1024 09/17/13 1042  BP: 120/66 152/52    Pulse: 82 100    Temp: 98.8 F (37.1 C) 98 F (36.7 C)    TempSrc: Oral Oral    Resp: 18 20    Height:      Weight:      SpO2: 95% 86% 93% 94%    Exam: General: No acute respiratory distress Lungs: crackles in RLL Cardiovascular: Regular rate and rhythm without murmur gallop or rub normal S1 and S2 Abdomen: Nontender, nondistended, soft, bowel  sounds positive, no rebound, no ascites, no appreciable mass Extremities: No significant cyanosis, clubbing, or edema bilateral lower extremities  Data Reviewed: Basic Metabolic Panel:  Recent Labs Lab 09/15/13 1816 09/16/13 0615  NA 137 143  K 4.7 4.6  CL 99 109  CO2 21 21  GLUCOSE 238* 155*  BUN 35* 28*  CREATININE 1.79* 1.71*  CALCIUM 10.2 8.8   Liver Function Tests:  Recent Labs Lab 09/15/13 1816  AST 24  ALT 26  ALKPHOS 41  BILITOT 0.2*  PROT 6.9  ALBUMIN 3.8   No results found for this basename: LIPASE, AMYLASE,  in the last 168 hours No results found for this basename: AMMONIA,  in the last 168 hours CBC:  Recent Labs Lab 09/15/13 1816 09/16/13 0615  WBC 8.1 5.6  NEUTROABS 6.7  --   HGB 11.8* 10.1*  HCT 37.2 32.0*  MCV 92.8 93.3  PLT 199 170   Cardiac Enzymes: No results found for this basename: CKTOTAL, CKMB, CKMBINDEX, TROPONINI,  in the last 168 hours BNP (last 3 results) No results found for this basename: PROBNP,  in the last 8760 hours CBG:  Recent Labs Lab 09/16/13 0756 09/16/13 1155 09/16/13 1653 09/16/13 2025 09/17/13 0755  GLUCAP 159* 202* 204* 212* 220*    Recent Results (from the past 240 hour(s))  CULTURE, BLOOD (ROUTINE X 2)     Status: None   Collection Time    09/15/13  6:17 PM      Result Value Ref Range Status   Specimen Description BLOOD RIGHT FOREARM   Final   Special Requests BOTTLES DRAWN AEROBIC AND ANAEROBIC 5CC   Final   Culture  Setup Time     Final   Value: 09/15/2013 21:54     Performed at [facility]     Final   Value: GROUP B STREP(S.AGALACTIAE)ISOLATED     Note: Gram Stain Report Called to,Read Back By and Verified With: JENNIFER VHU 09/16/13 1440 BY SMITHERSJ     Performed at [facility]   Report Status PENDING   Incomplete  CULTURE, BLOOD (ROUTINE X 2)     Status: None   Collection Time    09/15/13  6:20 PM      Result Value Ref Range Status   Specimen Description BLOOD  RIGHT HAND   Final   Special Requests BOTTLES DRAWN AEROBIC ONLY 10CC   Final   Culture  Setup Time     Final   Value: 09/15/2013 21:54     Performed at [place]     Final  Value:        BLOOD CULTURE RECEIVED NO GROWTH TO DATE CULTURE WILL BE HELD FOR 5 DAYS BEFORE ISSUING A FINAL NEGATIVE REPORT     Performed at [facility]   Report Status PENDING   Incomplete  URINE CULTURE     Status: None   Collection Time    09/15/13  6:36 PM      Result Value Ref Range Status   Specimen Description URINE, CATHETERIZED   Final   Special Requests NONE   Final   Culture  Setup Time     Final   Value: 09/15/2013 21:58     Performed at [facility]     Final   Value: NO GROWTH     Performed at [facility]   Culture     Final   Value: NO GROWTH     Performed at [facility]   Report Status 09/16/2013 FINAL   Final  MRSA PCR SCREENING     Status: None   Collection Time    09/16/13  1:00 AM      Result Value Ref Range Status   MRSA by PCR NEGATIVE  NEGATIVE Final   Comment:            The GeneXpert MRSA Assay (FDA     approved for NASAL specimens     only), is one component of a     comprehensive MRSA colonization     surveillance program. It is not     intended to diagnose MRSA     infection nor to guide or     monitor treatment for     MRSA infections.     Studies:  Recent x-ray studies have been reviewed in detail by the Attending Physician  Scheduled Meds:  Scheduled Meds: . antiseptic oral rinse  7 mL Mouth Rinse BID  . aspirin EC  81 mg Oral Daily  . enoxaparin (LOVENOX) injection  30 mg Subcutaneous Daily  . ferrous sulfate  325 mg Oral Q breakfast  . glimepiride  2 mg Oral Q breakfast  . insulin aspart  0-5 Units Subcutaneous QHS  . insulin aspart  0-9 Units Subcutaneous TID WC  . levETIRAcetam  500 mg Oral TID  . levothyroxine  100 mcg Oral QAC breakfast  . loratadine  10 mg Oral Daily  .  magnesium oxide  400 mg Oral TID  . omega-3 acid ethyl esters  2 g Oral QHS  . pantoprazole  40 mg Oral Daily  . vancomycin  1,000 mg Intravenous Q24H  . vitamin B-12  50 mcg Oral Daily  . vitamin C  500 mg Oral Daily   Continuous Infusions:    Time spent on care of this patient: 63 minute   [place], MD 09/17/2013, 12:00 PM  LOS: 2 days   [facility] Office  [phone] Pager - Text Page per [email or web address]  If 7PM-7AM, please contact night-coverage [email or web address]

## 2013-09-18 ENCOUNTER — Inpatient Hospital Stay (HOSPITAL_COMMUNITY): Payer: Medicare Other

## 2013-09-18 DIAGNOSIS — B950 Streptococcus, group A, as the cause of diseases classified elsewhere: Secondary | ICD-10-CM

## 2013-09-18 DIAGNOSIS — J9600 Acute respiratory failure, unspecified whether with hypoxia or hypercapnia: Secondary | ICD-10-CM

## 2013-09-18 DIAGNOSIS — J189 Pneumonia, unspecified organism: Secondary | ICD-10-CM

## 2013-09-18 DIAGNOSIS — A409 Streptococcal sepsis, unspecified: Principal | ICD-10-CM

## 2013-09-18 LAB — GLUCOSE, CAPILLARY
Glucose-Capillary: 207 mg/dL — ABNORMAL HIGH (ref 70–99)
Glucose-Capillary: 245 mg/dL — ABNORMAL HIGH (ref 70–99)
Glucose-Capillary: 328 mg/dL — ABNORMAL HIGH (ref 70–99)
Glucose-Capillary: 274 mg/dL — ABNORMAL HIGH (ref 70–99)

## 2013-09-18 LAB — BASIC METABOLIC PANEL
Anion gap: 13 (ref 5–15)
BUN: 26 mg/dL — ABNORMAL HIGH (ref 6–23)
CO2: 23 mEq/L (ref 19–32)
Calcium: 9.7 mg/dL (ref 8.4–10.5)
Chloride: 104 mEq/L (ref 96–112)
Creatinine, Ser: 1.61 mg/dL — ABNORMAL HIGH (ref 0.50–1.10)
GFR calc Af Amer: 34 mL/min — ABNORMAL LOW (ref >90)
GFR calc non Af Amer: 30 mL/min — ABNORMAL LOW (ref >90)
Glucose, Bld: 231 mg/dL — ABNORMAL HIGH (ref 70–99)
Potassium: 4.1 mEq/L (ref 3.7–5.3)
Sodium: 140 mEq/L (ref 137–147)

## 2013-09-18 LAB — CULTURE, BLOOD (ROUTINE X 2)

## 2013-09-18 MED ORDER — LEVOFLOXACIN 750 MG PO TABS: 750.0000 mg | ORAL_TABLET | ORAL | Status: DC

## 2013-09-18 MED ORDER — LEVOFLOXACIN 750 MG PO TABS
750.0000 mg | ORAL_TABLET | ORAL | Status: DC
  Administered 2013-09-18: 750 mg via ORAL
  Filled 2013-09-18 (×2): qty 1

## 2013-09-18 MED ORDER — ENOXAPARIN SODIUM 40 MG/0.4ML ~~LOC~~ SOLN
40.0000 mg | Freq: Every day | SUBCUTANEOUS | Status: DC
  Administered 2013-09-19: 40 mg via SUBCUTANEOUS
  Filled 2013-09-18 (×2): qty 0.4

## 2013-09-18 MED ORDER — FUROSEMIDE 10 MG/ML IJ SOLN
40.0000 mg | Freq: Two times a day (BID) | INTRAMUSCULAR | Status: DC
  Administered 2013-09-19: 40 mg via INTRAVENOUS
  Filled 2013-09-18 (×3): qty 4

## 2013-09-18 MED ORDER — AMOXICILLIN 500 MG PO CAPS: 500.0000 mg | ORAL_CAPSULE | Freq: Two times a day (BID) | ORAL | Status: DC

## 2013-09-18 MED ORDER — BISACODYL 5 MG PO TBEC
10.0000 mg | DELAYED_RELEASE_TABLET | Freq: Every day | ORAL | Status: DC
  Administered 2013-09-18 – 2013-09-20 (×3): 10 mg via ORAL
  Filled 2013-09-18 (×3): qty 2

## 2013-09-18 NOTE — Discharge Summary (Addendum)
Physician Discharge Summary  [name] [name] [id number] DOB: 30-Sep-1932 DOA: 09/15/2013  PCP: Alesia Richards, MD  Admit date: 09/15/2013 Discharge date: 09/20/2013  Time spent: >45 minutes  Recommendations for Outpatient Follow-up:  1. f/u on pulse ox in 1-2 wks to ensure improvement 2. Metformin on hold due to Cr > 1.5  Discharge Diagnoses:  Principal Problem:   Sepsis due to group A Streptococcus Active Problems:   Acute respiratory failure   CAP (community acquired pneumonia)   HYPOTHYROIDISM   Iron deficiency anemia   Hyperlipidemia   Lactic acidosis   AKI (acute kidney injury)   Type II diabetes mellitus with neurological manifestations   CKD stage 4 due to type 2 diabetes mellitus   Hypotension   Discharge Condition: stable  Diet recommendation: heart healthy, diabetic  Filed Weights   09/15/13 1748 09/15/13 2340  Weight: 96.2 kg (212 lb 1.3 oz) 101.3 kg (223 lb 5.2 oz)    History of present illness:  [name] [name] is a 80 y.o. female with a history of DM2, HTN, Hyperlipidemia and Stage IV CKD who presents to the ED with complaints of 2 days of chills then during the day she began to have Nausea and Vomiting x 3 episodes, and then she began to have fevers. She was found to have a temperature of 102 in the ED.Marland Kitchen She denied Chest Pain, and ABD pain. Her Lactic Acid level was found to be 3.17.    Hospital Course:  Principal Problem:  Sepsis Initiated on Vanc and Zosyn in the ER -No recent tick bites, no new rash   (a) gp A strep bacteremia-one set of blood cx postive for Gp A strep- d/w ID and advised that this should be treated as the source of her fever/sepsis - we will treat for a total of 14 days with Levaquin- 2 D ECHO was negative for vegetations  (b) CAP? Noted to be hypoxic on 7/31with crackles in RLL-  Pulse ox  Down to 85% on 7/31- CXR was unrevealing and therefore CT performed - report below- consistent with CAP( less likely pulm edema)-  she  has had a dry cough which is becoming more pronounced- I have diuresed her in case CT findings and hypoxia were related to IV fluids given on admission - can treat as a pneumonia with the same above mentioned Levaquin for total 14 day- Pulse ox improved to 89-99% on room air with exertion-  she is not dyspneic on exertion-      Active Problems:  Hypotension  -resolved and likely due to sepsis-  resumed atenolol   HYPOTHYROIDISM  -TSH normal  -Continue Synthroid   Iron deficiency anemia  -Continue iron   CKD stage 4 due to type 2 diabetes mellitus  - stable  Type II diabetes mellitus with neurological manifestations  -Metformin discontinued due to the fact that Cr > 1.5 even as outpt for years.  -Continue Amaryl - she had doubled her dose at home as her sugars were running high for the past month- she is not certain how many mg she was taking at home but had doubled up on the pills. -4 mg, which I have been giving her, has kept her sugar < 200  Right shoulder pain  -Per patient an MRI was scheduled as an outpatient by ortho but she was not able to have it performed-  -MRI ordered by admitting doctor- reveals rotator cuff tear- pt knows to go back to see her orthopedics surgeon- Dr Durward Fortes  Procedures:  none  Consultations:  none  Discharge Exam: Filed Vitals:   09/18/13 1522  BP: 119/69  Pulse: 74  Temp:   Resp:    General: No acute respiratory distress  Lungs: crackles in RLL  Cardiovascular: Regular rate and rhythm without murmur gallop or rub normal S1 and S2  Abdomen: Nontender, nondistended, soft, bowel sounds positive, no rebound, no ascites, no appreciable mass  Extremities: No significant cyanosis, clubbing, or edema bilateral lower extremities   Discharge Instructions You were cared for by a hospitalist during your hospital stay. If you have any questions about your discharge medications or the care you received while you were in the hospital after you  are discharged, you can call the unit and asked to speak with the hospitalist on call if the hospitalist that took care of you is not available. Once you are discharged, your primary care physician will handle any further medical issues. Please note that NO REFILLS for any discharge medications will be authorized once you are discharged, as it is imperative that you return to your primary care physician (or establish a relationship with a primary care physician if you do not have one) for your aftercare needs so that they can reassess your need for medications and monitor your lab values.      Discharge Instructions   Diet - low sodium heart healthy    Complete by:  As directed   Diabetic diet     Increase activity slowly    Complete by:  As directed             Medication List    STOP taking these medications       metFORMIN 500 MG tablet  Commonly known as:  GLUCOPHAGE      TAKE these medications       acetaminophen 500 MG tablet  Commonly known as:  TYLENOL  Take 1,000 mg by mouth every 6 (six) hours as needed for moderate pain.     aspirin 81 MG tablet  Take 81 mg by mouth daily.     atenolol 50 MG tablet  Commonly known as:  TENORMIN  Take 1 tablet (50 mg total) by mouth daily.     Biotin 5000 MCG Tabs  Take 5,000 Units by mouth daily.     fenofibrate micronized 134 MG capsule  Commonly known as:  LOFIBRA  Take 134 mg by mouth at bedtime.     ferrous sulfate dried 160 (50 FE) MG Tbcr SR tablet  Commonly known as:  SLOW FE  Take 160 mg by mouth 2 (two) times daily.     fish oil-omega-3 fatty acids 1000 MG capsule  Take 2 g by mouth at bedtime.     Flax Seed Oil 1000 MG Caps  Take 1 capsule by mouth 3 (three) times daily.     glimepiride 4 MG tablet  Commonly known as:  AMARYL  Take 2-4 mg by mouth daily with breakfast.     lansoprazole 30 MG capsule  Commonly known as:  PREVACID  Take 1 capsule (30 mg total) by mouth daily.     levETIRAcetam 500 MG  tablet  Commonly known as:  KEPPRA  Take 1 tablet (500 mg total) by mouth 3 (three) times daily. After meals. Generic is OK.     levofloxacin 750 MG tablet  Commonly known as:  LEVAQUIN  Take 1 tablet (750 mg total) by mouth every other day.  Start taking on:  09/20/2013  levothyroxine 100 MCG tablet  Commonly known as:  SYNTHROID, LEVOTHROID  Take 1 tablet (100 mcg total) by mouth daily.     loratadine 10 MG tablet  Commonly known as:  CLARITIN  Take 10 mg by mouth daily.     magnesium oxide 400 MG tablet  Commonly known as:  MAG-OX  Take 400 mg by mouth 3 (three) times daily.     meclizine 25 MG tablet  Commonly known as:  ANTIVERT  Take 25 mg by mouth daily.     vitamin B-12 100 MCG tablet  Commonly known as:  CYANOCOBALAMIN  Take 50 mcg by mouth daily.     vitamin C 500 MG tablet  Commonly known as:  ASCORBIC ACID  Take 500 mg by mouth daily.       Allergies  Allergen Reactions  . Ppd [Tuberculin Purified Protein Derivative] Other (See Comments)    indurated      The results of significant diagnostics from this hospitalization (including imaging, microbiology, ancillary and laboratory) are listed below for reference.    Significant Diagnostic Studies: Ct Chest Wo Contrast  09/18/2013   CLINICAL DATA:  Sepsis and hypoxia.  Evaluate for pneumonia.  EXAM: CT CHEST WITHOUT CONTRAST  TECHNIQUE: Multidetector CT imaging of the chest was performed following the standard protocol without IV contrast.  COMPARISON:  Prior chest x-ray 09/17/2013  FINDINGS: Mediastinum: Unremarkable CT appearance of the thyroid gland. No suspicious mediastinal or hilar adenopathy. An 8 mm prevascular lymph node is not enlarged by CT criteria. No soft tissue mediastinal mass. The thoracic esophagus is unremarkable.  Heart/Vascular: Limited evaluation in the absence of intravenous contrast. The heart itself is within normal limits for size. The epicardial fat is prominent. No pericardial  effusion. Atherosclerotic calcification including coronary artery disease. Conventional 3 vessel arch anatomy. No aneurysmal dilatation.  Lungs/Pleura: Patchy ground-glass attenuation opacity in a peribronchovascular distribution throughout all lobes of the lungs. There are trace bilateral pleural effusions and associated bibasilar atelectasis. Trace interlobular septal thickening in the apices. No suspicious pulmonary nodule.  Bones/Soft Tissues: No acute fracture or aggressive appearing lytic or blastic osseous lesion.  Upper Abdomen: Visualized upper abdominal organs are unremarkable.  IMPRESSION: 1. Patchy ground-glass attenuation opacity in a predominantly peribronchovascular distribution throughout all lobes of both lungs concerning for multifocal pneumonia/pneumonitis. Pulmonary edema is considered less likely given the relative absence of interlobular septal thickening. 2. Trace bilateral pleural effusions and passive lower lobe atelectasis. 3. Atherosclerosis including coronary artery disease. 4. The heart itself is normal in size but the pericardial fat is prominent yielding an overall enlarged cardiopericardial silhouette on chest x-ray.   Electronically Signed   By: Jacqulynn Cadet M.D.   On: 09/18/2013 14:31   Mr Shoulder Right Wo Contrast  09/17/2013   CLINICAL DATA:  Severe shoulder pain.  EXAM: MRI OF THE RIGHT SHOULDER WITHOUT CONTRAST  TECHNIQUE: Multiplanar, multisequence MR imaging of the shoulder was performed. No intravenous contrast was administered.  COMPARISON:  None.  FINDINGS: Rotator cuff: Severe supraspinatus tendinopathy is present. There is a near full-thickness tear at the anterior insertion of supraspinatus which measures 12 mm in width. Maximal retraction of the tendon is 12 mm on coronal imaging. The tendinous degeneration is severe in the critical zone. The infraspinatus tendon and teres minor tendons appear intact. The subscapularis tendon is intact.  Muscles:  No fatty  atrophy or edema.  Biceps long head: Shoulder is internally rotated at the time of imaging. The biceps long head tendon  appears diminutive but appears to be intact allowing for internal rotation.  Acromioclavicular Joint: Type 2 acromion. Subacromial bursitis. AC joint osteoarthritis is moderate.  Glenohumeral Joint: Normal.  Labrum:  Grossly normal.  Bones:  No fracture or aggressive osseous lesions.  IMPRESSION: 1. Severe supraspinatus tendinopathy with a near full thickness anterior insertional tear measuring 12 mm x 12 mm. The tear extends to the articular surface with a few bursal surface fibers remaining intact. 2. Moderate AC joint osteoarthritis. 3. Subacromial bursitis.   Electronically Signed   By: Dereck Ligas M.D.   On: 09/17/2013 16:41   Dg Chest Port 1 View  09/17/2013   CLINICAL DATA:  Hypoxia  EXAM: PORTABLE CHEST - 1 VIEW  COMPARISON:  09/15/2013  FINDINGS: The heart is mildly enlarged but stable. There is tortuosity and calcification of the thoracic aorta. There is moderate central vascular congestion and increased interstitial markings suspicious for edema. Bronchitis is also possible. No focal airspace consolidation or pleural effusion.  IMPRESSION: Cardiac enlargement and vascular congestion with either interstitial pulmonary edema or bronchitis. No definite infiltrates or effusions.   Electronically Signed   By: Kalman Jewels M.D.   On: 09/17/2013 12:00   Dg Chest Port 1 View  09/15/2013   CLINICAL DATA:  Chills.  EXAM: PORTABLE CHEST - 1 VIEW  COMPARISON:  07/08/2011.  FINDINGS: Mediastinum and hilar structures normal. Mild cardiomegaly, normal pulmonary vascularity. No pleural effusion or pneumothorax. No focal infiltrate. No acute bony abnormality.  IMPRESSION: Mild cardiomegaly, no CHF.  No acute cardiopulmonary disease.   Electronically Signed   By: Marcello Moores  Register   On: 09/15/2013 18:13    Microbiology: Recent Results (from the past 240 hour(s))  CULTURE, BLOOD (ROUTINE X  2)     Status: None   Collection Time    09/15/13  6:17 PM      Result Value Ref Range Status   Specimen Description BLOOD RIGHT FOREARM   Final   Special Requests BOTTLES DRAWN AEROBIC AND ANAEROBIC 5CC   Final   Culture  Setup Time     Final   Value: 09/15/2013 21:54     Performed at [facility]   Culture     Final   Value: GROUP B STREP(S.AGALACTIAE)ISOLATED     Note: Gram Stain Report Called to,Read Back By and Verified With: JENNIFER VHU 09/16/13 1440 BY SMITHERSJ     Performed at [facility]   Report Status PENDING   Incomplete  CULTURE, BLOOD (ROUTINE X 2)     Status: None   Collection Time    09/15/13  6:20 PM      Result Value Ref Range Status   Specimen Description BLOOD RIGHT HAND   Final   Special Requests BOTTLES DRAWN AEROBIC ONLY 10CC   Final   Culture  Setup Time     Final   Value: 09/15/2013 21:54     Performed at [facility]   Culture     Final   Value:        BLOOD CULTURE RECEIVED NO GROWTH TO DATE CULTURE WILL BE HELD FOR 5 DAYS BEFORE ISSUING A FINAL NEGATIVE REPORT     Performed at [facility]   Report Status PENDING   Incomplete  URINE CULTURE     Status: None   Collection Time    09/15/13  6:36 PM      Result Value Ref Range Status   Specimen Description URINE, CATHETERIZED   Final  Special Requests NONE   Final   Culture  Setup Time     Final   Value: 09/15/2013 21:58     Performed at [facility]     Final   Value: NO GROWTH     Performed at [facility]   Culture     Final   Value: NO GROWTH     Performed at [facility]   Report Status 09/16/2013 FINAL   Final  MRSA PCR SCREENING     Status: None   Collection Time    09/16/13  1:00 AM      Result Value Ref Range Status   MRSA by PCR NEGATIVE  NEGATIVE Final   Comment:            The GeneXpert MRSA Assay (FDA     approved for NASAL specimens     only), is one component of a     comprehensive MRSA colonization      surveillance program. It is not     intended to diagnose MRSA     infection nor to guide or     monitor treatment for     MRSA infections.     Labs: Basic Metabolic Panel:  Recent Labs Lab 09/15/13 1816 09/16/13 0615 09/18/13 0600  NA 137 143 140  K 4.7 4.6 4.1  CL 99 109 104  CO2 21 21 23   GLUCOSE 238* 155* 231*  BUN 35* 28* 26*  CREATININE 1.79* 1.71* 1.61*  CALCIUM 10.2 8.8 9.7   Liver Function Tests:  Recent Labs Lab 09/15/13 1816  AST 24  ALT 26  ALKPHOS 41  BILITOT 0.2*  PROT 6.9  ALBUMIN 3.8   No results found for this basename: LIPASE, AMYLASE,  in the last 168 hours No results found for this basename: AMMONIA,  in the last 168 hours CBC:  Recent Labs Lab 09/15/13 1816 09/16/13 0615  WBC 8.1 5.6  NEUTROABS 6.7  --   HGB 11.8* 10.1*  HCT 37.2 32.0*  MCV 92.8 93.3  PLT 199 170   Cardiac Enzymes: No results found for this basename: CKTOTAL, CKMB, CKMBINDEX, TROPONINI,  in the last 168 hours BNP: BNP (last 3 results) No results found for this basename: PROBNP,  in the last 8760 hours CBG:  Recent Labs Lab 09/17/13 1214 09/17/13 1722 09/17/13 2136 09/18/13 0803 09/18/13 1153  GLUCAP 244* 271* 256* 207* 245*       SignedDebbe Odea, MD  [facility] 09/20/2013, 3:28 PM

## 2013-09-18 NOTE — Progress Notes (Signed)
  Echocardiogram 2D Echocardiogram has been performed.  Mauricio Po 09/18/2013, 11:38 AM

## 2013-09-18 NOTE — Plan of Care (Signed)
Problem: Phase II Progression Outcomes Goal: Discharge plan established Outcome: Progressing Patient has been transitioned to PO antibiotics.  Remains afebrile this shift.  No acute events occurred overnight.  Will continue to monitor patient condition.

## 2013-09-18 NOTE — Progress Notes (Signed)
SATURATION QUALIFICATIONS: (This note is used to comply with regulatory documentation for home oxygen)  Patient Saturations on Room Air at Rest = 87%  Patient Saturations on Room Air while Ambulating = 84%  Patient Saturations on 2 Liters of oxygen while Ambulating = 91%  Please briefly explain why patient needs home oxygen: Pt oxygen level decrease without oxygen.

## 2013-09-18 NOTE — Progress Notes (Signed)
Unable to d/c patient home as insurance will not cover home O2 in acute conditions. Will cont to follow in hospital until hypoxia resolves. Debbe Odea, MD

## 2013-09-19 LAB — GLUCOSE, CAPILLARY
Glucose-Capillary: 203 mg/dL — ABNORMAL HIGH (ref 70–99)
Glucose-Capillary: 265 mg/dL — ABNORMAL HIGH (ref 70–99)
Glucose-Capillary: 250 mg/dL — ABNORMAL HIGH (ref 70–99)
Glucose-Capillary: 239 mg/dL — ABNORMAL HIGH (ref 70–99)

## 2013-09-19 LAB — BASIC METABOLIC PANEL
Anion gap: 15 (ref 5–15)
BUN: 39 mg/dL — ABNORMAL HIGH (ref 6–23)
CO2: 24 mEq/L (ref 19–32)
Calcium: 10.2 mg/dL (ref 8.4–10.5)
Chloride: 101 mEq/L (ref 96–112)
Creatinine, Ser: 1.75 mg/dL — ABNORMAL HIGH (ref 0.50–1.10)
GFR calc Af Amer: 31 mL/min — ABNORMAL LOW (ref >90)
GFR calc non Af Amer: 27 mL/min — ABNORMAL LOW (ref >90)
Glucose, Bld: 213 mg/dL — ABNORMAL HIGH (ref 70–99)
Potassium: 4 mEq/L (ref 3.7–5.3)
Sodium: 140 mEq/L (ref 137–147)

## 2013-09-19 MED ORDER — GLIMEPIRIDE 4 MG PO TABS
4.0000 mg | ORAL_TABLET | Freq: Every day | ORAL | Status: DC
  Administered 2013-09-19 – 2013-09-20 (×2): 4 mg via ORAL
  Filled 2013-09-19 (×3): qty 1

## 2013-09-19 NOTE — Progress Notes (Signed)
SATURATION QUALIFICATIONS: (This note is used to comply with regulatory documentation for home oxygen)  Patient Saturations on Room Air at Rest = 90%  Patient Saturations on Room Air while Ambulating = 86%  Patient Saturations on 2 Liters of oxygen while Ambulating = 91%  Please briefly explain why patient needs home oxygen:

## 2013-09-19 NOTE — Progress Notes (Signed)
CARE MANAGEMENT NOTE 09/19/2013  Patient:  [name] [name], [name] [name]   Account Number:  [id number]  Date Initiated:  09/17/2013  Documentation initiated by:  [name]  Subjective/Objective Assessment:   dx n/v, chills  admit- from [facility].     Action/Plan:   Anticipated DC Date:  09/18/2013   Anticipated DC Plan:  [place]  CM consult      Choice offered to / List presented to:             Status of service:  In process, will continue to follow Medicare Important Message given?  YES (If response is "NO", the following Medicare IM given date fields will be blank) Date Medicare IM given:  09/17/2013 Medicare IM given by:  [name] Date Additional Medicare IM given:   Additional Medicare IM given by:    Discharge Disposition:    Per UR Regulation:    If discussed at Long Length of Stay Meetings, dates discussed:    Comments:  09/18/13 17:00 LATE ENTRY: CM called MD as [name] states a chronic condition must be documented for pt's insurance to cover oxygen.  [name], BSN. CM [id number].  09/17/13 [name], BSN [phone] patient is from [name] living.  NCM will continue to follow for dc needs.

## 2013-09-19 NOTE — Progress Notes (Signed)
[facility] Progress Note   LAIKYNN WHITTY [id number] DOB: [birth year] DOA: 09/15/2013 PCP: Alesia Richards, MD  Brief narrative: [name] [name] is a 80 y.o. female  presents with 2 days of chills and one episode of vomiting on the day of admission. She was found to have lactic acidosis, hypotension and mildly positive UA. Past medical history: CKD  4, diabetes mellitus type 2, hypertension, hypothyroidism seizure disorder and morbid obesity  Subjective: Continues to feels well without complaints. Only a mild dry cough. No dyspnea or orthopnea.   Assessment/Plan: Principal Problem: Sepsis- gp A strep bacteremia- UTI a) gp A strep bacteremia-one set of blood cx postive for Gp A strep- d/w ID and advised that this should be treated as the source of her fever/sepsis - we will treat for a total of 14 days with Levaquin- 2 D ECHO was negative for vegetations   (b) CAP? Noted to be hypoxic on 7/31with crackles in RLL- Pulse ox Down to 85% on 7/31 and still low today- CXR was unrevealing and therefore CT performed without contrast (renal failure)- report below- consistent with CAP( less likely pulm edema)- she has no cough but can treat as a pneumonia with the same above mentioned Levaquin for now- diurese as well in case this is related to fluid given on admission and resultant pulm edema- follow closely to prevent dehydration- may need to check V/Q...   Active Problems:   Hypotension -resolved- resume atenolol and follow    HYPOTHYROIDISM -TSH normal -Continue Synthroid    Iron deficiency anemia -Continue iron   CKD stage 4 due to type 2 diabetes mellitus   Type II diabetes mellitus with neurological manifestations -Metformin discontinued as Cr has been persistently > 1.5 even prior to this admission -Continue Amaryl- double dose today as sugars quite elevated -Continue sliding scale insulin  Right shoulder pain -Per patient an MRI was scheduled as an outpatient  but she was not able to have it performed- -MRI ordered by admitting doctor- reveals rotator cuff tear- pt knows to go back to see her orthopedics surgeon, Dr Durward Fortes    Code Status: Full code  Family Communication: None  Disposition Plan: Home  Consultants: None  Procedures: None  Antibiotics: Anti-infectives   Start     Dose/Rate Route Frequency Ordered Stop   09/20/13 0000  levofloxacin (LEVAQUIN) 750 MG tablet  Status:  Discontinued     750 mg Oral Every 48 hours 09/18/13 1524 09/18/13    09/20/13 0000  levofloxacin (LEVAQUIN) 750 MG tablet     750 mg Oral Every 48 hours 09/18/13 1607 09/27/13 2359   09/18/13 1800  levofloxacin (LEVAQUIN) tablet 750 mg     750 mg Oral Every 48 hours 09/18/13 1521     09/18/13 0000  amoxicillin (AMOXIL) 500 MG capsule  Status:  Discontinued     500 mg Oral Every 12 hours 09/18/13 1110 09/18/13    09/17/13 2200  amoxicillin (AMOXIL) capsule 500 mg  Status:  Discontinued     500 mg Oral Every 12 hours 09/17/13 1840 09/19/13 0957   09/17/13 0200  vancomycin (VANCOCIN) IVPB 1000 mg/200 mL premix  Status:  Discontinued     1,000 mg 200 mL/hr over 60 Minutes Intravenous Every 24 hours 09/16/13 1529 09/17/13 1613   09/17/13 0000  vancomycin (VANCOCIN) 1,500 mg in sodium chloride 0.9 % 500 mL IVPB  Status:  Discontinued     1,500 mg 250 mL/hr over 120 Minutes Intravenous Every 24 hours 09/16/13 0815  09/16/13 0834   09/16/13 2000  cefTRIAXone (ROCEPHIN) 2 g in dextrose 5 % 50 mL IVPB  Status:  Discontinued     2 g 100 mL/hr over 30 Minutes Intravenous Every 24 hours 09/15/13 1801 09/15/13 2341   09/16/13 1800  cefTRIAXone (ROCEPHIN) 1 g in dextrose 5 % 50 mL IVPB  Status:  Discontinued     1 g 100 mL/hr over 30 Minutes Intravenous Every 24 hours 09/16/13 0835 09/17/13 0731   09/16/13 1000  ceFEPIme (MAXIPIME) 1 g in dextrose 5 % 50 mL IVPB  Status:  Discontinued     1 g 100 mL/hr over 30 Minutes Intravenous Every 12 hours 09/15/13 2316 09/16/13  0835   09/15/13 2300  vancomycin (VANCOCIN) 1,500 mg in sodium chloride 0.9 % 500 mL IVPB     1,500 mg 250 mL/hr over 120 Minutes Intravenous  Once 09/15/13 2254 09/16/13 0235   09/15/13 1800  cefTRIAXone (ROCEPHIN) 2 g in dextrose 5 % 50 mL IVPB     2 g 100 mL/hr over 30 Minutes Intravenous  Once 09/15/13 1758 09/15/13 1908       DVT prophylaxis: Lovenox  Objective: Filed Weights   09/15/13 1748 09/15/13 2340  Weight: 96.2 kg (212 lb 1.3 oz) 101.3 kg (223 lb 5.2 oz)   No intake or output data in the 24 hours ending 09/19/13 1311   Vitals Filed Vitals:   09/18/13 2147 09/19/13 0125 09/19/13 0509 09/19/13 0954  BP: 136/70 108/65 129/77 110/73  Pulse: 78 69 69 63  Temp: 97.5 F (36.4 C) 97.6 F (36.4 C) 97.3 F (36.3 C) 97.6 F (36.4 C)  TempSrc: Oral Oral Oral Oral  Resp: 20 20 18 20   Height:      Weight:      SpO2: 96% 94% 95% 92%    Exam: General: No acute respiratory distress Lungs: crackles in RLL Cardiovascular: Regular rate and rhythm without murmur gallop or rub normal S1 and S2 Abdomen: Nontender, nondistended, soft, bowel sounds positive, no rebound, no ascites, no appreciable mass Extremities: No significant cyanosis, clubbing, or edema bilateral lower extremities  Data Reviewed: Basic Metabolic Panel:  Recent Labs Lab 09/15/13 1816 09/16/13 0615 09/18/13 0600 09/19/13 0432  NA 137 143 140 140  K 4.7 4.6 4.1 4.0  CL 99 109 104 101  CO2 21 21 23 24   GLUCOSE 238* 155* 231* 213*  BUN 35* 28* 26* 39*  CREATININE 1.79* 1.71* 1.61* 1.75*  CALCIUM 10.2 8.8 9.7 10.2   Liver Function Tests:  Recent Labs Lab 09/15/13 1816  AST 24  ALT 26  ALKPHOS 41  BILITOT 0.2*  PROT 6.9  ALBUMIN 3.8   No results found for this basename: LIPASE, AMYLASE,  in the last 168 hours No results found for this basename: AMMONIA,  in the last 168 hours CBC:  Recent Labs Lab 09/15/13 1816 09/16/13 0615  WBC 8.1 5.6  NEUTROABS 6.7  --   HGB 11.8* 10.1*  HCT  37.2 32.0*  MCV 92.8 93.3  PLT 199 170   Cardiac Enzymes: No results found for this basename: CKTOTAL, CKMB, CKMBINDEX, TROPONINI,  in the last 168 hours BNP (last 3 results) No results found for this basename: PROBNP,  in the last 8760 hours CBG:  Recent Labs Lab 09/18/13 1153 09/18/13 1712 09/18/13 2144 09/19/13 0757 09/19/13 1202  GLUCAP 245* 328* 274* 203* 265*    Recent Results (from the past 240 hour(s))  CULTURE, BLOOD (ROUTINE X 2)  Status: None   Collection Time    09/15/13  6:17 PM      Result Value Ref Range Status   Specimen Description BLOOD RIGHT FOREARM   Final   Special Requests BOTTLES DRAWN AEROBIC AND ANAEROBIC 5CC   Final   Culture  Setup Time     Final   Value: 09/15/2013 21:54     Performed at [facility]   Culture     Final   Value: GROUP B STREP(S.AGALACTIAE)ISOLATED     Note: Gram Stain Report Called to,Read Back By and Verified With: JENNIFER VHU 09/16/13 1440 BY SMITHERSJ     Performed at [facility]   Report Status 09/18/2013 FINAL   Final   Organism ID, Bacteria GROUP B STREP(S.AGALACTIAE)ISOLATED   Final  CULTURE, BLOOD (ROUTINE X 2)     Status: None   Collection Time    09/15/13  6:20 PM      Result Value Ref Range Status   Specimen Description BLOOD RIGHT HAND   Final   Special Requests BOTTLES DRAWN AEROBIC ONLY 10CC   Final   Culture  Setup Time     Final   Value: 09/15/2013 21:54     Performed at [facility]   Culture     Final   Value:        BLOOD CULTURE RECEIVED NO GROWTH TO DATE CULTURE WILL BE HELD FOR 5 DAYS BEFORE ISSUING A FINAL NEGATIVE REPORT     Performed at [facility]   Report Status PENDING   Incomplete  URINE CULTURE     Status: None   Collection Time    09/15/13  6:36 PM      Result Value Ref Range Status   Specimen Description URINE, CATHETERIZED   Final   Special Requests NONE   Final   Culture  Setup Time     Final   Value: 09/15/2013 21:58     Performed at [place]     Final   Value: NO GROWTH     Performed at [facility]   Culture     Final   Value: NO GROWTH     Performed at [facility]   Report Status 09/16/2013 FINAL   Final  MRSA PCR SCREENING     Status: None   Collection Time    09/16/13  1:00 AM      Result Value Ref Range Status   MRSA by PCR NEGATIVE  NEGATIVE Final   Comment:            The GeneXpert MRSA Assay (FDA     approved for NASAL specimens     only), is one component of a     comprehensive MRSA colonization     surveillance program. It is not     intended to diagnose MRSA     infection nor to guide or     monitor treatment for     MRSA infections.     Studies:  Recent x-ray studies have been reviewed in detail by the Attending Physician  Scheduled Meds:  Scheduled Meds: . antiseptic oral rinse  7 mL Mouth Rinse BID  . aspirin EC  81 mg Oral Daily  . atenolol  50 mg Oral Q24H  . bisacodyl  10 mg Oral Daily  . enoxaparin (LOVENOX) injection  40 mg Subcutaneous Daily  . ferrous sulfate  325 mg Oral Q breakfast  .  furosemide  40 mg Intravenous BID  . glimepiride  4 mg Oral Q breakfast  . insulin aspart  0-5 Units Subcutaneous QHS  . insulin aspart  0-9 Units Subcutaneous TID WC  . levETIRAcetam  500 mg Oral TID  . levofloxacin  750 mg Oral Q48H  . levothyroxine  100 mcg Oral QAC breakfast  . loratadine  10 mg Oral Daily  . magnesium oxide  400 mg Oral TID  . omega-3 acid ethyl esters  2 g Oral QHS  . pantoprazole  40 mg Oral Daily  . vitamin B-12  50 mcg Oral Daily  . vitamin C  500 mg Oral Daily   Continuous Infusions:    Time spent on care of this patient: 18 minute   [place], MD 09/19/2013, 1:11 PM  LOS: 4 days   [facility] Office  [phone] Pager - Text Page per [email or web address]  If 7PM-7AM, please contact night-coverage [email or web address]

## 2013-09-20 LAB — BASIC METABOLIC PANEL
Anion gap: 14 (ref 5–15)
BUN: 46 mg/dL — ABNORMAL HIGH (ref 6–23)
CO2: 24 mEq/L (ref 19–32)
Calcium: 10.4 mg/dL (ref 8.4–10.5)
Chloride: 107 mEq/L (ref 96–112)
Creatinine, Ser: 1.66 mg/dL — ABNORMAL HIGH (ref 0.50–1.10)
GFR calc Af Amer: 33 mL/min — ABNORMAL LOW (ref >90)
GFR calc non Af Amer: 28 mL/min — ABNORMAL LOW (ref >90)
Glucose, Bld: 188 mg/dL — ABNORMAL HIGH (ref 70–99)
Potassium: 4 mEq/L (ref 3.7–5.3)
Sodium: 145 mEq/L (ref 137–147)

## 2013-09-20 LAB — GLUCOSE, CAPILLARY
Glucose-Capillary: 178 mg/dL — ABNORMAL HIGH (ref 70–99)
Glucose-Capillary: 197 mg/dL — ABNORMAL HIGH (ref 70–99)
Glucose-Capillary: 267 mg/dL — ABNORMAL HIGH (ref 70–99)

## 2013-09-20 MED ORDER — GLIMEPIRIDE 4 MG PO TABS: 4.0000 mg | ORAL_TABLET | Freq: Every day | ORAL | Status: DC

## 2013-09-20 MED ORDER — LEVOFLOXACIN 750 MG PO TABS: 750.0000 mg | ORAL_TABLET | ORAL | Status: DC

## 2013-09-20 MED ORDER — ENOXAPARIN SODIUM 30 MG/0.3ML ~~LOC~~ SOLN
30.0000 mg | SUBCUTANEOUS | Status: DC
  Filled 2013-09-20: qty 0.3

## 2013-09-20 NOTE — Progress Notes (Signed)
Inpatient Diabetes Program Recommendations  AACE/ADA: New Consensus Statement on Inpatient Glycemic Control (2013)  Target Ranges:  Prepandial:   less than 140 mg/dL      Peak postprandial:   less than 180 mg/dL (1-2 hours)      Critically ill patients:  140 - 180 mg/dL   Results for LETHEA, BRANIGAN (MRN [id number]) as of 09/20/2013 10:43  Ref. Range 09/19/2013 07:57 09/19/2013 12:02 09/19/2013 17:28 09/19/2013 20:13 09/20/2013 00:03 09/20/2013 07:51  Glucose-Capillary Latest Range: 70-99 mg/dL 203 (H) 265 (H) 250 (H) 239 (H) 267 (H) 197 (H)   Diabetes history: DM2 Outpatient Diabetes medications: Amaryl 2-4 mg QAM, Metformin 500 mg BID Current orders for Inpatient glycemic control: Amaryl 4 mg QAM, Novolog 0-9 units AC, Novolog 0-5 units HS  Inpatient Diabetes Program Recommendations Insulin - Basal: If patient remains inpatient, please consider ordering low dose basal insulin. Recommend starting Lantus 6 units QHS (based on 69 kg x 0.1 units).  Thanks, Barnie Alderman, RN, MSN, CCRN Diabetes Coordinator Inpatient Diabetes Program [phone] (Team Pager) [phone] (AP office) [phone] [facility] office)

## 2013-09-20 NOTE — Progress Notes (Signed)
Pt evaluated, examined and plan discussed. She will be d/c'd home today as pulse ox is > 885 on room air and she is no longer requiring O2.  Please see d/c summary from 8/1 which I have updated today.   Debbe Odea, MD

## 2013-09-20 NOTE — Progress Notes (Signed)
Nsg Discharge Note  Admit Date:  09/15/2013 Discharge date: 09/20/2013   ANALEISE [name] to be D/C'd Home to independent living per MD order.  AVS completed.  Copy for chart, and copy for patient signed, and dated. Patient/caregiver able to verbalize understanding.  Discharge Medication:   Medication List    STOP taking these medications       metFORMIN 500 MG tablet  Commonly known as:  GLUCOPHAGE      TAKE these medications       acetaminophen 500 MG tablet  Commonly known as:  TYLENOL  Take 1,000 mg by mouth every 6 (six) hours as needed for moderate pain.     aspirin 81 MG tablet  Take 81 mg by mouth daily.     atenolol 50 MG tablet  Commonly known as:  TENORMIN  Take 1 tablet (50 mg total) by mouth daily.     Biotin 5000 MCG Tabs  Take 5,000 Units by mouth daily.     fenofibrate micronized 134 MG capsule  Commonly known as:  LOFIBRA  Take 134 mg by mouth at bedtime.     ferrous sulfate dried 160 (50 FE) MG Tbcr SR tablet  Commonly known as:  SLOW FE  Take 160 mg by mouth 2 (two) times daily.     fish oil-omega-3 fatty acids 1000 MG capsule  Take 2 g by mouth at bedtime.     Flax Seed Oil 1000 MG Caps  Take 1 capsule by mouth 3 (three) times daily.     glimepiride 4 MG tablet  Commonly known as:  AMARYL  Take 1 tablet (4 mg total) by mouth daily with breakfast.     lansoprazole 30 MG capsule  Commonly known as:  PREVACID  Take 1 capsule (30 mg total) by mouth daily.     levETIRAcetam 500 MG tablet  Commonly known as:  KEPPRA  Take 1 tablet (500 mg total) by mouth 3 (three) times daily. After meals. Generic is OK.     levofloxacin 750 MG tablet  Commonly known as:  LEVAQUIN  Take 1 tablet (750 mg total) by mouth every other day.     levothyroxine 100 MCG tablet  Commonly known as:  SYNTHROID, LEVOTHROID  Take 1 tablet (100 mcg total) by mouth daily.     loratadine 10 MG tablet  Commonly known as:  CLARITIN  Take 10 mg by mouth daily.     magnesium oxide 400 MG tablet  Commonly known as:  MAG-OX  Take 400 mg by mouth 3 (three) times daily.     meclizine 25 MG tablet  Commonly known as:  ANTIVERT  Take 25 mg by mouth daily.     vitamin B-12 100 MCG tablet  Commonly known as:  CYANOCOBALAMIN  Take 50 mcg by mouth daily.     vitamin C 500 MG tablet  Commonly known as:  ASCORBIC ACID  Take 500 mg by mouth daily.        Discharge Assessment: Filed Vitals:   09/20/13 0559  BP: 133/79  Pulse: 71  Temp: 98.1 F (36.7 C)  Resp: 18   Skin clean, dry and intact without evidence of skin break down, no evidence of skin tears noted. IV catheter discontinued intact. Site without signs and symptoms of complications - no redness or edema noted at insertion site, patient denies c/o pain - only slight tenderness at site.  Dressing with slight pressure applied.  D/c Instructions-Education: Discharge instructions given to patient/family with  verbalized understanding. D/c education completed with patient/family including follow up instructions, medication list, d/c activities limitations if indicated, with other d/c instructions as indicated by MD - patient able to verbalize understanding, all questions fully answered. Patient instructed to return to ED, call 911, or call MD for any changes in condition.  Patient escorted via [place], and D/C home via private auto.  [place], RN 09/20/2013 2:14 PM

## 2013-09-21 LAB — CULTURE, BLOOD (ROUTINE X 2): Culture: NO GROWTH

## 2013-09-27 ENCOUNTER — Encounter: Payer: Self-pay | Admitting: Internal Medicine

## 2013-09-27 ENCOUNTER — Ambulatory Visit (INDEPENDENT_AMBULATORY_CARE_PROVIDER_SITE_OTHER): Payer: Medicare Other | Admitting: Internal Medicine

## 2013-09-27 VITALS — BP 126/72 | HR 80 | Temp 97.0°F | Resp 18 | Ht 62.75 in | Wt 215.6 lb

## 2013-09-27 DIAGNOSIS — Z79899 Other long term (current) drug therapy: Secondary | ICD-10-CM

## 2013-09-27 DIAGNOSIS — IMO0001 Reserved for inherently not codable concepts without codable children: Secondary | ICD-10-CM

## 2013-09-27 DIAGNOSIS — B3731 Acute candidiasis of vulva and vagina: Secondary | ICD-10-CM

## 2013-09-27 DIAGNOSIS — B373 Candidiasis of vulva and vagina: Secondary | ICD-10-CM

## 2013-09-27 DIAGNOSIS — E1165 Type 2 diabetes mellitus with hyperglycemia: Secondary | ICD-10-CM

## 2013-09-27 DIAGNOSIS — N3000 Acute cystitis without hematuria: Secondary | ICD-10-CM

## 2013-09-27 MED ORDER — FLUCONAZOLE 150 MG PO TABS: ORAL_TABLET | ORAL | Status: DC

## 2013-09-27 MED ORDER — GLIMEPIRIDE 4 MG PO TABS: 4.0000 mg | ORAL_TABLET | Freq: Every day | ORAL | Status: DC

## 2013-09-27 NOTE — Progress Notes (Signed)
Subjective:    Patient ID: [name] [name], female    DOB: 1932-05-21, 80 y.o.   MRN: [id number]  HPI Very nice 80 yo WWF 1 week s/p hospitalization 7/29-09/20/2013 for urosepsis - finishing Levaquin & Amoxicillin. Apparently was dehydrated with worse prerenal azotemia (alto review shows BUN/Creat stable over the last year)  and Metformin was d/c'd. CBG's are running in the 100-200's range. No diabetic poly's or visual blurring. Does c/o vaginal itching & discharge.  Medication Sig  . acetaminophen (TYLENOL) 500 MG tablet Take 1,000 mg by mouth every 6 (six) hours as needed for moderate pain.  Marland Kitchen aspirin 81 MG tablet Take 81 mg by mouth daily.  Marland Kitchen atenolol (TENORMIN) 50 MG tablet Take 1 tablet (50 mg total) by mouth daily.  . Biotin 5000 MCG TABS Take 5,000 Units by mouth daily.  . fenofibrate micronized (LOFIBRA) 134 MG capsule Take 134 mg by mouth at bedtime.  . ferrous sulfate dried (SLOW FE) 160 (50 FE) MG TBCR Take 160 mg by mouth 2 (two) times daily.   . fish oil-omega-3 fatty acids 1000 MG capsule Take 2 g by mouth at bedtime.   . Flaxseed, Linseed, (FLAX SEED OIL) 1000 MG CAPS Take 1 capsule by mouth 3 (three) times daily.  Marland Kitchen glimepiride (AMARYL) 4 MG tablet Take 1 tablet (4 mg total) by mouth daily with breakfast.  . lansoprazole (PREVACID) 30 MG capsule Take 1 capsule (30 mg total) by mouth daily.  Marland Kitchen levETIRAcetam (KEPPRA) 500 MG tablet Take 1 tablet (500 mg total) by mouth 3 (three) times daily. After meals. Generic is OK.  Marland Kitchen levothyroxine (SYNTHROID, LEVOTHROID) 100 MCG tablet Take 1 tablet (100 mcg total) by mouth daily.  Marland Kitchen loratadine (CLARITIN) 10 MG tablet Take 10 mg by mouth daily.  . magnesium oxide (MAG-OX) 400 MG tablet Take 400 mg by mouth 3 (three) times daily.   . meclizine (ANTIVERT) 25 MG tablet Take 25 mg by mouth daily.   . vitamin B-12 (CYANOCOBALAMIN) 100 MCG tablet Take 50 mcg by mouth daily.  . vitamin C (ASCORBIC ACID) 500 MG tablet Take 500 mg by mouth daily.    Allergies  Allergen Reactions  . Ppd [Tuberculin Purified Protein Derivative] Other (See Comments)    indurated   Past Medical History  Diagnosis Date  . Anemia, unspecified   . Meniere's disease   . UTI (urinary tract infection)   . High cholesterol   . Other forms of epilepsy and recurrent seizures without mention of intractable epilepsy 11/04/2012    Non convulsive paroxysmal spells, responding to [facility], patient not driving.   Marland Kitchen GERD (gastroesophageal reflux disease)   . Vitamin D deficiency   . Gout   . Hypothyroidism   . Obesity (BMI 30-39.9)   . Hypertension   . Pneumonia ~ 1943  . Type II or unspecified type diabetes mellitus without mention of complication, not stated as uncontrolled   . DM neuropathy, type II diabetes mellitus   . Epilepsy   . Arthritis     "knees; right shoulder" (09/15/2013)  . Basal cell carcinoma     "right upper outer lip"  . Skin cancer     "forehead; right hand"   Review of Systems In addition to the HPI above,  No Fever-chills,  No Headache, No changes with Vision or hearing,  No problems swallowing food or Liquids,  No Chest pain or productive Cough or Shortness of Breath,  No Abdominal pain, No Nausea or Vomitting, Bowel movements  are regular,  No Blood in stool or Urine,  No dysuria,  No new joints pains-aches,  No new weakness, tingling, numbness in any extremity,  No recent weight loss,  No polyuria, polydipsia or polyphagia,  No significant Mental Stressors.  A full 10 point Review of Systems was done, except as stated above, all other Review of Systems were negative  Objective:   Physical Exam BP 126/72  Pulse 80  Temp(Src) 97 F (36.1 C) (Temporal)  Resp 18  Ht 5' 2.75" (1.594 m)  Wt 215 lb 9.6 oz (97.796 kg)  BMI 38.49 kg/m2  HEENT - Eac's patent. TM's Nl. EOM's full. PERRLA. NasoOroPharynx clear. Neck - supple. Nl Thyroid. Carotids 2+ & No bruits, nodes, JVD Chest - Clear equal BS w/o Rales, rhonchi, wheezes. Cor  - Nl HS. RRR w/o sig MGR. PP 1(+). No edema. Abd - Soft/obese. No palpable organomegaly, masses or tenderness. BS nl. MS- FROM w/o deformities. Muscle power, tone and bulk Nl. Gait Nl. Neuro - No obvious Cr N abnormalities. Sensory, motor and Cerebellar functions appear Nl w/o focal abnormalities. Psyche - Mental status normal & appropriate.  No delusions, ideations or obvious mood abnormalities.  Assessment & Plan:   1. UTI  2. Vaginal thrush  3. Encounter for long-term (current) use of other medications  - BASIC METABOLIC PANEL WITH GFR

## 2013-09-27 NOTE — Patient Instructions (Signed)
  Candidal Vulvovaginitis Candidal vulvovaginitis is an infection of the vagina and vulva. The vulva is the skin around the opening of the vagina. This may cause itching and discomfort in and around the vagina.  HOME CARE  Only take medicine as told by your doctor.  Do not have sex (intercourse) until the infection is healed or as told by your doctor.  Practice safe sex.  Tell your sex partner about your infection.  Do not douche or use tampons.  Wear cotton underwear. Do not wear tight pants or panty hose.  Eat yogurt. This may help treat and prevent yeast infections. GET HELP RIGHT AWAY IF:   You have a fever.  Your problems get worse during treatment or do not get better in 3 days.  You have discomfort, irritation, or itching in your vagina or vulva area.  You have pain after sex.  You start to get belly (abdominal) pain. MAKE SURE YOU:  Understand these instructions.  Will watch your condition.  Will get help right away if you are not doing well or get worse. Document Released: 05/03/2008 Document Revised: 02/09/2013 Document Reviewed: 05/03/2008 [facility] Patient Information 2015 [place], [place]. This information is not intended to replace advice given to you by your health care provider. Make sure you discuss any questions you have with your health care provider.    Urinary Tract Infection A urinary tract infection (UTI) can occur any place along the urinary tract. The tract includes the kidneys, ureters, bladder, and urethra. A type of germ called bacteria often causes a UTI. UTIs are often helped with antibiotic medicine.  HOME CARE   If given, take antibiotics as told by your doctor. Finish them even if you start to feel better.  Drink enough fluids to keep your pee (urine) clear or pale yellow.  Avoid tea, drinks with caffeine, and bubbly (carbonated) drinks.  Pee often. Avoid holding your pee in for a long time.  Pee before and after having sex  (intercourse).  Wipe from front to back after you poop (bowel movement) if you are a woman. Use each tissue only once. GET HELP RIGHT AWAY IF:   You have back pain.  You have lower belly (abdominal) pain.  You have chills.  You feel sick to your stomach (nauseous).  You throw up (vomit).  Your burning or discomfort with peeing does not go away.  You have a fever.  Your symptoms are not better in 3 days. MAKE SURE YOU:   Understand these instructions.  Will watch your condition.  Will get help right away if you are not doing well or get worse. Document Released: 07/24/2007 Document Revised: 10/30/2011 Document Reviewed: 09/05/2011 [facility] Patient Information 2015 [place], [place]. This information is not intended to replace advice given to you by your health care provider. Make sure you discuss any questions you have with your health care provider.

## 2013-09-28 LAB — BASIC METABOLIC PANEL WITH GFR
BUN: 33 mg/dL — ABNORMAL HIGH (ref 6–23)
CO2: 26 mEq/L (ref 19–32)
Calcium: 10 mg/dL (ref 8.4–10.5)
Chloride: 103 mEq/L (ref 96–112)
Creat: 2.01 mg/dL — ABNORMAL HIGH (ref 0.50–1.10)
GFR, Est African American: 27 mL/min — ABNORMAL LOW
GFR, Est Non African American: 23 mL/min — ABNORMAL LOW
Glucose, Bld: 235 mg/dL — ABNORMAL HIGH (ref 70–99)
Potassium: 4.6 mEq/L (ref 3.5–5.3)
Sodium: 140 mEq/L (ref 135–145)

## 2013-09-30 ENCOUNTER — Telehealth: Payer: Self-pay | Admitting: Oncology

## 2013-09-30 NOTE — Telephone Encounter (Signed)
, °

## 2013-10-04 ENCOUNTER — Ambulatory Visit: Payer: Self-pay | Admitting: Physician Assistant

## 2013-10-04 ENCOUNTER — Other Ambulatory Visit: Payer: Medicare Other

## 2013-10-07 ENCOUNTER — Telehealth: Payer: Self-pay | Admitting: *Deleted

## 2013-10-07 NOTE — Telephone Encounter (Signed)
Spoke with patient about appointment, r/s to 11/04/13 at 3 pm with NP MM.

## 2013-10-11 ENCOUNTER — Ambulatory Visit: Payer: Self-pay | Admitting: Physician Assistant

## 2013-10-11 ENCOUNTER — Ambulatory Visit: Payer: Medicare Other | Admitting: Oncology

## 2013-10-18 ENCOUNTER — Encounter: Payer: Self-pay | Admitting: Physician Assistant

## 2013-10-18 ENCOUNTER — Ambulatory Visit (INDEPENDENT_AMBULATORY_CARE_PROVIDER_SITE_OTHER): Payer: Medicare Other | Admitting: Physician Assistant

## 2013-10-18 VITALS — BP 128/78 | HR 68 | Temp 97.7°F | Resp 16 | Ht 62.0 in | Wt 219.0 lb

## 2013-10-18 DIAGNOSIS — E559 Vitamin D deficiency, unspecified: Secondary | ICD-10-CM

## 2013-10-18 DIAGNOSIS — G40802 Other epilepsy, not intractable, without status epilepticus: Secondary | ICD-10-CM

## 2013-10-18 DIAGNOSIS — Z79899 Other long term (current) drug therapy: Secondary | ICD-10-CM

## 2013-10-18 DIAGNOSIS — E1149 Type 2 diabetes mellitus with other diabetic neurological complication: Secondary | ICD-10-CM

## 2013-10-18 DIAGNOSIS — D509 Iron deficiency anemia, unspecified: Secondary | ICD-10-CM

## 2013-10-18 DIAGNOSIS — E039 Hypothyroidism, unspecified: Secondary | ICD-10-CM

## 2013-10-18 DIAGNOSIS — Z0000 Encounter for general adult medical examination without abnormal findings: Secondary | ICD-10-CM

## 2013-10-18 DIAGNOSIS — K2100 Gastro-esophageal reflux disease with esophagitis, without bleeding: Secondary | ICD-10-CM

## 2013-10-18 DIAGNOSIS — N390 Urinary tract infection, site not specified: Secondary | ICD-10-CM

## 2013-10-18 DIAGNOSIS — E782 Mixed hyperlipidemia: Secondary | ICD-10-CM

## 2013-10-18 DIAGNOSIS — I10 Essential (primary) hypertension: Secondary | ICD-10-CM

## 2013-10-18 DIAGNOSIS — N184 Chronic kidney disease, stage 4 (severe): Secondary | ICD-10-CM

## 2013-10-18 DIAGNOSIS — E119 Type 2 diabetes mellitus without complications: Secondary | ICD-10-CM

## 2013-10-18 DIAGNOSIS — Z789 Other specified health status: Secondary | ICD-10-CM

## 2013-10-18 DIAGNOSIS — E1129 Type 2 diabetes mellitus with other diabetic kidney complication: Secondary | ICD-10-CM

## 2013-10-18 DIAGNOSIS — Z1331 Encounter for screening for depression: Secondary | ICD-10-CM

## 2013-10-18 DIAGNOSIS — E1122 Type 2 diabetes mellitus with diabetic chronic kidney disease: Secondary | ICD-10-CM

## 2013-10-18 DIAGNOSIS — Z23 Encounter for immunization: Secondary | ICD-10-CM

## 2013-10-18 LAB — CBC WITH DIFFERENTIAL/PLATELET
Basophils Absolute: 0 10*3/uL (ref 0.0–0.1)
Basophils Relative: 1 % (ref 0–1)
Eosinophils Absolute: 0.5 10*3/uL (ref 0.0–0.7)
Eosinophils Relative: 11 % — ABNORMAL HIGH (ref 0–5)
HCT: 35.4 % — ABNORMAL LOW (ref 36.0–46.0)
Hemoglobin: 11.7 g/dL — ABNORMAL LOW (ref 12.0–15.0)
Lymphocytes Relative: 35 % (ref 12–46)
Lymphs Abs: 1.6 10*3/uL (ref 0.7–4.0)
MCH: 29.7 pg (ref 26.0–34.0)
MCHC: 33.1 g/dL (ref 30.0–36.0)
MCV: 89.8 fL (ref 78.0–100.0)
Monocytes Absolute: 0.5 10*3/uL (ref 0.1–1.0)
Monocytes Relative: 11 % (ref 3–12)
Neutro Abs: 1.9 10*3/uL (ref 1.7–7.7)
Neutrophils Relative %: 42 % — ABNORMAL LOW (ref 43–77)
Platelets: 212 10*3/uL (ref 150–400)
RBC: 3.94 MIL/uL (ref 3.87–5.11)
RDW: 14.7 % (ref 11.5–15.5)
WBC: 4.5 10*3/uL (ref 4.0–10.5)

## 2013-10-18 LAB — LIPID PANEL
Cholesterol: 168 mg/dL (ref 0–200)
HDL: 28 mg/dL — ABNORMAL LOW (ref >39)
LDL Cholesterol: 76 mg/dL (ref 0–99)
Total CHOL/HDL Ratio: 6 Ratio
Triglycerides: 321 mg/dL — ABNORMAL HIGH (ref <150)
VLDL: 64 mg/dL — ABNORMAL HIGH (ref 0–40)

## 2013-10-18 LAB — TSH: TSH: 3.191 u[IU]/mL (ref 0.350–4.500)

## 2013-10-18 LAB — BASIC METABOLIC PANEL WITH GFR
BUN: 30 mg/dL — ABNORMAL HIGH (ref 6–23)
CO2: 24 mEq/L (ref 19–32)
Calcium: 10.6 mg/dL — ABNORMAL HIGH (ref 8.4–10.5)
Chloride: 105 mEq/L (ref 96–112)
Creat: 1.85 mg/dL — ABNORMAL HIGH (ref 0.50–1.10)
GFR, Est African American: 30 mL/min — ABNORMAL LOW
GFR, Est Non African American: 26 mL/min — ABNORMAL LOW
Glucose, Bld: 188 mg/dL — ABNORMAL HIGH (ref 70–99)
Potassium: 4.9 mEq/L (ref 3.5–5.3)
Sodium: 139 mEq/L (ref 135–145)

## 2013-10-18 LAB — HEPATIC FUNCTION PANEL
ALT: 16 U/L (ref 0–35)
AST: 13 U/L (ref 0–37)
Albumin: 4.3 g/dL (ref 3.5–5.2)
Alkaline Phosphatase: 36 U/L — ABNORMAL LOW (ref 39–117)
Bilirubin, Direct: 0.1 mg/dL (ref 0.0–0.3)
Indirect Bilirubin: 0.3 mg/dL (ref 0.2–1.2)
Total Bilirubin: 0.4 mg/dL (ref 0.2–1.2)
Total Protein: 6.5 g/dL (ref 6.0–8.3)

## 2013-10-18 LAB — HEMOGLOBIN A1C
Hgb A1c MFr Bld: 8 % — ABNORMAL HIGH (ref <5.7)
Mean Plasma Glucose: 183 mg/dL — ABNORMAL HIGH (ref <117)

## 2013-10-18 LAB — MAGNESIUM: Magnesium: 2.1 mg/dL (ref 1.5–2.5)

## 2013-10-18 NOTE — Patient Instructions (Signed)
PLEASE MAKE A MAMMOGRAM APPOINTMENT  The Amaryl  forces your blood sugar down no matter what it is starting at. This can cause diabetics to have to eat more to keep their blood sugar elevated which goes against what our goals are for you. Only take 1/2 of the Amaryl if your sugar is above 120 and you can take a whole Amaryl if your sugar is above 150 in the morning. If at any time you start to have low blood sugars in the morning or during the day please stop this medication. Please never take this medication if you are sick or can not eat. A low blood sugar is much more dangerous than a high blood sugar.    We are going to put you on Trajenta for you blood sugars. Take one in the morning, if your sugars start to go below 150 you can stop it.     Bad carbs also include fruit juice, alcohol, and sweet tea. These are empty calories that do not signal to your brain that you are full.   Please remember the good carbs are still carbs which convert into sugar. So please measure them out no more than 1/2-1 cup of rice, oatmeal, pasta, and beans.  Veggies are however free foods! Pile them on.   I like lean protein at every meal such as chicken, [place], pork chops, cottage cheese, etc. Just do not fry these meats and please center your meal around vegetable, the meats should be a side dish.   No all fruit is created equal. Please see the list below, the fruit at the bottom is higher in sugars than the fruit at the top

## 2013-10-18 NOTE — Progress Notes (Signed)
MEDICARE ANNUAL WELLNESS VISIT AND FOLLOW UP  Assessment:   1. Hypertension - continue medications, DASH diet, exercise and monitor at home. Call if greater than 130/80. - CBC with Differential - BASIC METABOLIC PANEL WITH GFR - Hepatic function panel  2. Gastroesophageal reflux disease with esophagitis Diet discussed, continue meds  3. HYPOTHYROIDISM - TSH  4. T2 NIDDM w/Stage 4 CKD (GFR 29 ml/min) Uncontrolled since hospitalization- will add on Trajenta 5 mg once daily samples, monitor sugars closely, diet discussed - Hemoglobin A1c  5. Type II diabetes mellitus with neurological manifestations Discussed general issues about diabetes pathophysiology and management., Educational material distributed., Suggested low cholesterol diet., Encouraged aerobic exercise., Discussed foot care., Reminded to get yearly retinal exam. Add Trajenta 5mg  daily for sugars, monitor closely.  - HM DIABETES FOOT EXAM  6. Atypical Seizures Continue medications  7. CKD stage 4 due to type 2 diabetes mellitus - BASIC METABOLIC PANEL WITH GFR  8. Encounter for long-term (current) use of other medications - Magnesium  9. Hyperlipidemia -continue medications, check lipids, decrease fatty foods, increase activity. - Lipid panel  10. Iron deficiency anemia Check CBC  11. Vitamin D Deficiency - Vit D  25 hydroxy (rtn osteoporosis monitoring)  12. S/p urosepsis, will check culture, some dry cough/SOB  -declines CXR/EKG at that time, will check labs, CBC etc - Urinalysis, Routine w reflex microscopic - Urine culture   Plan:   During the course of the visit the patient was educated and counseled about appropriate screening and preventive services including:    Pneumococcal vaccine   Influenza vaccine  Td vaccine  Screening electrocardiogram  Screening mammography  Bone densitometry screening  Colorectal cancer screening  Diabetes screening  Glaucoma screening  Nutrition  counseling   Advanced directives: given info/requested  Screening recommendations, referrals:  Vaccinations: Tdap vaccine not indicated Influenza vaccine ordered Pneumococcal vaccine prevnar due in 1 year Shingles vaccine not indicated Hep B vaccine not indicated  Nutrition assessed and recommended  Colonoscopy requested Mammogram requested Pap smear not indicated Pelvic exam not indicated Recommended yearly ophthalmology/optometry visit for glaucoma screening and checkup Recommended yearly dental visit for hygiene and checkup Advanced directives - will talk about it with someone at the home  Conditions/risks identified: BMI: Discussed weight loss, diet, and increase physical activity.  Increase physical activity: AHA recommends 150 minutes of physical activity a week.  Medications reviewed DEXA- declined Diabetes is not at goal, ACE/ARB therapy: Yes. Urinary Incontinence is an issue: discussed non pharmacology and pharmacology options.  Fall risk: low- discussed PT, home fall assessment, medications.    Subjective:   [name] [name] is a 80 y.o. female who presents for Medicare Annual Wellness Visit and 3 month follow up on hypertension, diabetes with CKD/p.neuropathy, hyperlipidemia, vitamin D def.  Date of last medicare wellness visit is unknown.   Her blood pressure has been controlled at home, today their BP is BP: 128/78 mmHg She does workout, has been trying to walk but she has had some dyspnea with exertion, denies chest pain, dizziness, she has had a dry, nonproductive cough.  She is on cholesterol medication and denies myalgias. Her cholesterol is not at goal. The cholesterol last visit was:   Lab Results  Component Value Date   CHOL 168 06/28/2013   HDL 34* 06/28/2013   LDLCALC 82 06/28/2013   TRIG 258* 06/28/2013   CHOLHDL 4.9 06/28/2013   She has been working on diet and exercise for diabetes, however she states she has gotten out  of the hospital for urosepsis  Aug 3rd, her sugars have been elevated being 150-200's in the AM. She is down to 2 MF daily due to stage 4 CKD from her DM and she is on the Amaryl 4mg  QD, she also has P.Neuropathy but declines the needs for meds and denies hypoglycemia , polydipsia and polyuria. Last A1C in the office was:  Lab Results  Component Value Date   HGBA1C 7.3* 09/16/2013   Patient is on Vitamin D supplement. Lab Results  Component Value Date   VD25OH 60 06/28/2013     She is on thyroid medication. Her medication was not changed last visit. Patient denies nervousness, palpitations and weight changes.  Lab Results  Component Value Date   TSH 0.951 09/16/2013  .  Hospitalization 7/29-09/20/2013 for urosepsis, she is feeling better but still weakened.  She has been having left knee pain/swelling, has seen Dr. Durward Fortes and he states she needs a partial repair.  She lives in an assisted living home.   Names of Other Physician/Practitioners you currently use: 1. [facility]- here for primary care 2. Dr. Ellie Lunch, eye doctor, last visit 1 year ago, going this Sept Patient Care Team: Unk Pinto, MD as PCP - General (Internal Medicine) Rozetta Nunnery, MD as Consulting Physician (Otolaryngology) Missy Sabins, MD as Consulting Physician (Gastroenterology) Deatra Robinson, MD as Consulting Physician (Oncology)  Medication Review Current Outpatient Prescriptions on File Prior to Visit  Medication Sig Dispense Refill  . acetaminophen (TYLENOL) 500 MG tablet Take 1,000 mg by mouth every 6 (six) hours as needed for moderate pain.      Marland Kitchen aspirin 81 MG tablet Take 81 mg by mouth daily.      Marland Kitchen atenolol (TENORMIN) 50 MG tablet Take 1 tablet (50 mg total) by mouth daily.  90 tablet  3  . Biotin 5000 MCG TABS Take 5,000 Units by mouth daily.      . clobetasol cream (TEMOVATE) 0.05 %       . fenofibrate micronized (LOFIBRA) 134 MG capsule Take 134 mg by mouth at bedtime.      .  ferrous sulfate dried (SLOW FE) 160 (50 FE) MG TBCR Take 160 mg by mouth 2 (two) times daily.       . fish oil-omega-3 fatty acids 1000 MG capsule Take 2 g by mouth at bedtime.       . Flaxseed, Linseed, (FLAX SEED OIL) 1000 MG CAPS Take 1 capsule by mouth 3 (three) times daily.      Marland Kitchen glimepiride (AMARYL) 4 MG tablet Take 1 tablet (4 mg total) by mouth daily with breakfast.  90 tablet  99  . lansoprazole (PREVACID) 30 MG capsule Take 1 capsule (30 mg total) by mouth daily.  90 capsule  3  . levETIRAcetam (KEPPRA) 500 MG tablet Take 1 tablet (500 mg total) by mouth 3 (three) times daily. After meals. Generic is OK.  270 tablet  99  . levothyroxine (SYNTHROID, LEVOTHROID) 100 MCG tablet Take 1 tablet (100 mcg total) by mouth daily.  90 tablet  99  . loratadine (CLARITIN) 10 MG tablet Take 10 mg by mouth daily.      . magnesium oxide (MAG-OX) 400 MG tablet Take 400 mg by mouth 3 (three) times daily.       . meclizine (ANTIVERT) 25 MG tablet Take 25 mg by mouth daily.       . metFORMIN (GLUCOPHAGE-XR) 500 MG 24 hr tablet       .  vitamin B-12 (CYANOCOBALAMIN) 100 MCG tablet Take 50 mcg by mouth daily.      . vitamin C (ASCORBIC ACID) 500 MG tablet Take 500 mg by mouth daily.       No current facility-administered medications on file prior to visit.    Current Problems (verified) Patient Active Problem List   Diagnosis Date Noted  . CAP (community acquired pneumonia) 09/18/2013  . Acute respiratory failure 09/17/2013  . Sepsis due to group A Streptococcus 09/15/2013  . Lactic acidosis 09/15/2013  . AKI (acute kidney injury) 09/15/2013  . Type II diabetes mellitus with neurological manifestations 09/15/2013  . CKD stage 4 due to type 2 diabetes mellitus 09/15/2013  . Hypotension 09/15/2013  . Hyperlipidemia 06/27/2013  . Vitamin D Deficiency 06/27/2013  . Encounter for long-term (current) use of other medications 06/27/2013  . Atypical Seizures 11/04/2012  . Iron deficiency anemia  06/13/2008  . Colon Polyps, Hx/o 06/13/2008  . COLONIC POLYPS 06/09/2008  . HYPOTHYROIDISM 06/09/2008  . T2 NIDDM w/Stage 4 CKD (GFR 29 ml/min) 06/09/2008  . VENOUS INSUFFICIENCY 06/09/2008  . GERD w/Hx of Esophageal Stricture 06/09/2008  . Hypertension 06/09/2008    Screening Tests Health Maintenance  Topic Date Due  . Ophthalmology Exam  [birth year]10/1942  . Tetanus/tdap  12/10/1953  . Influenza Vaccine  09/18/2013  . Hemoglobin A1c  03/19/2014  . Foot Exam  06/29/2014  . Urine Microalbumin  06/29/2014  . Colonoscopy  08/02/2018  . Pneumococcal Polysaccharide Vaccine Age 35 And Over  Completed  . Zostavax  Completed     Immunization History  Administered Date(s) Administered  . DTaP 03/18/2007  . Influenza-Unspecified 11/18/2012  . Pneumococcal Polysaccharide-23 09/16/2013  . Zoster 02/28/2005    Preventative care: Last colonoscopy: 2010 Last mammogram: 2013- [facility]  Last pap smear/pelvic exam: remote  DEXA:N/A Echo 2015: EF 60-65 %, mild AS, RSVP 36  Prior vaccinations: TD or Tdap: 2009  Influenza: 2014 NEEDS Pneumococcal: 2015 Shingles/Zostavax: 2007  History reviewed: allergies, current medications, past family history, past medical history, past social history, past surgical history and problem list  Risk Factors: Osteoporosis: postmenopausal estrogen deficiency and dietary calcium and/or vitamin D deficiency History of fracture in the past year: no  Tobacco History  Substance Use Topics  . Smoking status: Former Smoker -- 1.00 packs/day for 30 years    Types: Cigarettes    Quit date: 06/29/1983  . Smokeless tobacco: Never Used  . Alcohol Use: Yes     Comment: 09/15/2013 "glass of wine maybe once/year"   She does not smoke.  Patient is a former smoker. Are there smokers in your home (other than you)?  No  Alcohol Current alcohol use: rare  Caffeine Current caffeine use: coffee 1 /day  Exercise Current exercise: walking  Nutrition/Diet Current  diet: in general, a "healthy" diet    Cardiac risk factors: advanced age (older than 72 for men, 73 for women), diabetes mellitus, dyslipidemia, hypertension, obesity (BMI >= 30 kg/m2) and sedentary lifestyle.  Depression Screen (Note: if answer to either of the following is "Yes", a more complete depression screening is indicated)   Q1: Over the past two weeks, have you felt down, depressed or hopeless? No  Q2: Over the past two weeks, have you felt little interest or pleasure in doing things? No  Have you lost interest or pleasure in daily life? No  Do you often feel hopeless? No  Do you cry easily over simple problems? No  Activities of Daily Living In your present state  of health, do you have any difficulty performing the following activities?:  Driving? No Managing money?  No Feeding yourself? No Getting from bed to chair? No Climbing a flight of stairs? No Preparing food and eating?: No Bathing or showering? No Getting dressed: No Getting to the toilet? No Using the toilet:No Moving around from place to place: No In the past year have you fallen or had a near fall?:No   Are you sexually active?  No  Do you have more than one partner?  No  Vision Difficulties: No  Hearing Difficulties: Yes wears hearing aids Do you often ask people to speak up or repeat themselves? No Do you experience ringing or noises in your ears? No Do you have difficulty understanding soft or whispered voices? No  Cognition  Do you feel that you have a problem with memory?Yes  Do you often misplace items? Yes  Do you feel safe at home?  Yes  Advanced directives Does patient have a [place]? No Does patient have a Living Will? No   Objective:   Blood pressure 128/78, pulse 68, temperature 97.7 F (36.5 C), resp. rate 16, height 5\' 2"  (1.575 m), weight 219 lb (99.338 kg). Body mass index is 40.05 kg/(m^2).  General appearance: alert, no distress, WD/WN,   female Cognitive Testing  Alert? Yes  Normal Appearance?Yes  Oriented to person? Yes  Place? Yes   Time? Yes  Recall of three objects?  Yes  Can perform simple calculations? Yes  Displays appropriate judgment?Yes  Can read the correct time from a watch face?Yes  HEENT: normocephalic, sclerae anicteric, TMs pearly, nares patent, no discharge or erythema, pharynx normal Oral cavity: MMM, no lesions Neck: supple, no lymphadenopathy, no thyromegaly, no masses Heart: RRR, normal S1, S2, no murmurs Lungs: CTA bilaterally, no wheezes, rhonchi, or rales Abdomen: +bs, soft, non tender, non distended, no masses, no hepatomegaly, no splenomegaly Musculoskeletal: nontender, no swelling, no obvious deformity Extremities: no edema, no cyanosis, no clubbing Pulses: 2+ symmetric, upper and lower extremities, normal cap refill Neurological: alert, oriented x 3, CN2-12 intact, strength normal upper extremities and lower extremities, sensation normal throughout, DTRs 2+ throughout, no cerebellar signs, gait normal Psychiatric:ormal affect, behavior normal, pleasant  Breast: defer Gyn: defer Rectal: defer  Medicare Attestation I have personally reviewed: The patient's medical and social history Their use of alcohol, tobacco or illicit drugs Their current medications and supplements The patient's functional ability including ADLs,fall risks, home safety risks, cognitive, and hearing and visual impairment Diet and physical activities Evidence for depression or mood disorders  The patient's weight, height, BMI, and visual acuity have been recorded in the chart.  I have made referrals, counseling, and provided education to the patient based on review of the above and I have provided the patient with a written personalized care plan for preventive services.     Vicie Mutters, PA-C   10/18/2013

## 2013-10-19 LAB — URINE CULTURE: Colony Count: 70000

## 2013-10-19 LAB — VITAMIN D 25 HYDROXY (VIT D DEFICIENCY, FRACTURES): Vit D, 25-Hydroxy: 51 ng/mL (ref 30–89)

## 2013-11-05 ENCOUNTER — Ambulatory Visit: Payer: Medicare Other | Admitting: Nurse Practitioner

## 2013-11-08 ENCOUNTER — Encounter: Payer: Self-pay | Admitting: Adult Health

## 2013-11-08 ENCOUNTER — Ambulatory Visit (INDEPENDENT_AMBULATORY_CARE_PROVIDER_SITE_OTHER): Payer: Medicare Other | Admitting: Adult Health

## 2013-11-08 VITALS — BP 147/80 | HR 78 | Ht 63.0 in | Wt 219.0 lb

## 2013-11-08 DIAGNOSIS — G40802 Other epilepsy, not intractable, without status epilepticus: Secondary | ICD-10-CM
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
## 2013-11-08 NOTE — Patient Instructions (Signed)
Seizure, Adult A seizure means there is unusual activity in the brain. A seizure can cause changes in attention or behavior. Seizures often cause shaking (convulsions). Seizures often last from 30 seconds to 2 minutes. HOME CARE   If you are given medicines, take them exactly as told by your doctor.  Keep all doctor visits as told.  Do not swim or drive until your doctor says it is okay.  Teach others what to do if you have a seizure. They should:  Lay you on the ground.  Put a cushion under your head.  Loosen any tight clothing around your neck.  Turn you on your side.  Stay with you until you get better. GET HELP RIGHT AWAY IF:   The seizure lasts longer than 2 to 5 minutes.  The seizure is very bad.  The person does not wake up after the seizure.  The person's attention or behavior changes. Drive the person to the emergency room or call your local emergency services (911 in [place] MAKE SURE YOU:   Understand these instructions.  Will watch your condition.  Will get help right away if you are not doing well or get worse. Document Released: 07/24/2007 Document Revised: 04/29/2011 Document Reviewed: 01/23/2011 Exit[employer] Patient Information 2015 [employer]. This information is not intended to replace advice given to you by your health care provider. Make sure you discuss any questions you have with your health care provider.  

## 2013-11-09 NOTE — Progress Notes (Signed)
I agree with the assessment and plan as directed by NP .The patient is known to me .   Lawrence Roldan, MD  

## 2013-11-19 ENCOUNTER — Other Ambulatory Visit: Payer: Self-pay | Admitting: Internal Medicine

## 2013-11-19 DIAGNOSIS — Z1231 Encounter for screening mammogram for malignant neoplasm of breast: Secondary | ICD-10-CM

## 2014-01-03 ENCOUNTER — Ambulatory Visit (HOSPITAL_COMMUNITY)
Admission: RE | Admit: 2014-01-03 | Discharge: 2014-01-03 | Disposition: A | Discharge status: Home / Self Care | Payer: Medicare Other | Source: Ambulatory Visit | Attending: Internal Medicine | Admitting: Internal Medicine

## 2014-01-03 DIAGNOSIS — Z1231 Encounter for screening mammogram for malignant neoplasm of breast: Secondary | ICD-10-CM | POA: Diagnosis present

## 2014-01-05 ENCOUNTER — Other Ambulatory Visit: Payer: Self-pay

## 2014-01-05 MED ORDER — METFORMIN HCL ER 500 MG PO TB24: 500.0000 mg | ORAL_TABLET | Freq: Two times a day (BID) | ORAL | Status: DC

## 2014-01-05 MED ORDER — MECLIZINE HCL 25 MG PO TABS: 25.0000 mg | ORAL_TABLET | Freq: Every day | ORAL | Status: DC

## 2014-01-10 ENCOUNTER — Ambulatory Visit: Payer: Self-pay | Admitting: Internal Medicine

## 2014-01-24 ENCOUNTER — Encounter: Payer: Self-pay | Admitting: Internal Medicine

## 2014-01-24 ENCOUNTER — Ambulatory Visit (INDEPENDENT_AMBULATORY_CARE_PROVIDER_SITE_OTHER): Payer: Medicare Other | Admitting: Internal Medicine

## 2014-01-24 VITALS — BP 126/72 | HR 76 | Temp 97.5°F | Resp 16 | Ht 62.75 in | Wt 218.4 lb

## 2014-01-24 DIAGNOSIS — E1122 Type 2 diabetes mellitus with diabetic chronic kidney disease: Secondary | ICD-10-CM

## 2014-01-24 DIAGNOSIS — E782 Mixed hyperlipidemia: Secondary | ICD-10-CM

## 2014-01-24 DIAGNOSIS — I10 Essential (primary) hypertension: Secondary | ICD-10-CM

## 2014-01-24 DIAGNOSIS — Z79899 Other long term (current) drug therapy: Secondary | ICD-10-CM

## 2014-01-24 DIAGNOSIS — E559 Vitamin D deficiency, unspecified: Secondary | ICD-10-CM

## 2014-01-24 DIAGNOSIS — G40909 Epilepsy, unspecified, not intractable, without status epilepticus: Secondary | ICD-10-CM

## 2014-01-24 LAB — CBC WITH DIFFERENTIAL/PLATELET
Basophils Absolute: 0 10*3/uL (ref 0.0–0.1)
Basophils Relative: 0 % (ref 0–1)
Eosinophils Absolute: 0.7 10*3/uL (ref 0.0–0.7)
Eosinophils Relative: 8 % — ABNORMAL HIGH (ref 0–5)
HCT: 37.1 % (ref 36.0–46.0)
Hemoglobin: 12.2 g/dL (ref 12.0–15.0)
Lymphocytes Relative: 11 % — ABNORMAL LOW (ref 12–46)
Lymphs Abs: 1 10*3/uL (ref 0.7–4.0)
MCH: 29.5 pg (ref 26.0–34.0)
MCHC: 32.9 g/dL (ref 30.0–36.0)
MCV: 89.6 fL (ref 78.0–100.0)
MPV: 10.7 fL (ref 9.4–12.4)
Monocytes Absolute: 0.5 10*3/uL (ref 0.1–1.0)
Monocytes Relative: 6 % (ref 3–12)
Neutro Abs: 6.7 10*3/uL (ref 1.7–7.7)
Neutrophils Relative %: 75 % (ref 43–77)
Platelets: 264 10*3/uL (ref 150–400)
RBC: 4.14 MIL/uL (ref 3.87–5.11)
RDW: 15 % (ref 11.5–15.5)
WBC: 8.9 10*3/uL (ref 4.0–10.5)

## 2014-01-24 LAB — HEMOGLOBIN A1C
Hgb A1c MFr Bld: 7.3 % — ABNORMAL HIGH (ref <5.7)
Mean Plasma Glucose: 163 mg/dL — ABNORMAL HIGH (ref <117)

## 2014-01-24 MED ORDER — SAXAGLIPTIN HCL 5 MG PO TABS: ORAL_TABLET | ORAL | Status: DC

## 2014-01-24 NOTE — Patient Instructions (Signed)

## 2014-01-24 NOTE — Progress Notes (Signed)
Patient ID: [name] [name], female   DOB: 11-10-1932, 80 y.o.   MRN: [id number]   This very nice 80 y.o.WWF presents for 3 month follow up with Hypertension, Hyperlipidemia, Pre-Diabetes and Vitamin D Deficiency. Saw Dr Georjean Mode in Sept for new glasses & Dr Maureen Chatters in Aug for f/u of her atypical seizures which apparently have been controlled on Keppra.   Patient is treated for HTN & BP has been controlled at home. Today's BP: 126/72 mmHg. Patient has had no complaints of any cardiac type chest pain, palpitations, dyspnea/orthopnea/PND, dizziness, claudication, or dependent edema.   Hyperlipidemia is controlled with diet & meds. Patient denies myalgias or other med SE's. Last Lipids were  Chol 168; HDL  28; LDL 76; Trig 321 on 10/18/2013.   Also, the patient has history of Morbid obesity (BMI 39) and consequent T2_NIDDM PreDiabetes and has had no symptoms of reactive hypoglycemia, diabetic polys, paresthesias or visual blurring.  Last A1c was  8.0% on  10/18/2013.Occasionally takes Tradjenta/Onglyza Sx's if glucoses over 150 mg%.   Further, the patient also has history of Vitamin D Deficiency and supplements vitamin D without any suspected side-effects. Last vitamin D was 51 on 10/18/2013 (? off [place] ?)    Medication List   aspirin 81 MG tablet  Take 81 mg by mouth daily.     atenolol 50 MG tablet  Commonly known as:  TENORMIN  Take 1 tablet (50 mg total) by mouth daily.     Biotin 5000 MCG Tabs  Take 5,000 Units by mouth daily.     clobetasol cream 0.05 %  Commonly known as:  TEMOVATE     fenofibrate micronized 134 MG capsule  Commonly known as:  LOFIBRA  Take 134 mg by mouth at bedtime.     ferrous sulfate dried 160 (50 FE) MG Tbcr SR tablet  Commonly known as:  SLOW FE  Take 160 mg by mouth 2 (two) times daily.     fish oil-omega-3 fatty acids 1000 MG capsule  Take 2 g by mouth at bedtime.     Flax Seed Oil 1000 MG Caps  Take 1 capsule by mouth 3 (three) times daily.     fluconazole 150 MG tablet  Commonly known as:  DIFLUCAN  Take 150 mg by mouth daily.     FREESTYLE INSULINX TEST test strip  Generic drug:  glucose blood     freestyle lancets     glimepiride 4 MG tablet  Commonly known as:  AMARYL  Take 1 tablet (4 mg total) by mouth daily with breakfast.     lansoprazole 30 MG capsule  Commonly known as:  PREVACID  Take 1 capsule (30 mg total) by mouth daily.     levETIRAcetam 500 MG tablet  Commonly known as:  KEPPRA  Take 1 tablet (500 mg total) by mouth 3 (three) times daily. After meals. Generic is OK.     levothyroxine 100 MCG tablet  Commonly known as:  SYNTHROID, LEVOTHROID  Take 1 tablet (100 mcg total) by mouth daily.     loratadine 10 MG tablet  Commonly known as:  CLARITIN  Take 10 mg by mouth daily.     magnesium oxide 400 MG tablet  Commonly known as:  MAG-OX  Take 400 mg by mouth 3 (three) times daily.     meclizine 25 MG tablet  Commonly known as:  ANTIVERT  Take 1 tablet (25 mg total) by mouth daily.     metFORMIN 500 MG 24 hr  tablet  Commonly known as:  GLUCOPHAGE-XR  Take 1 tablet (500 mg total) by mouth 2 (two) times daily.     vitamin B-12 100 MCG tablet  Commonly known as:  CYANOCOBALAMIN  Take 50 mcg by mouth daily.     vitamin C 500 MG tablet  Commonly known as:  ASCORBIC ACID  Take 500 mg by mouth daily.     Allergies  Allergen Reactions  . Ppd [Tuberculin Purified Protein Derivative] Other (See Comments)    indurated    PMHx:   Past Medical History  Diagnosis Date  . Anemia, unspecified   . Meniere's disease   . UTI (urinary tract infection)   . High cholesterol   . Other forms of epilepsy and recurrent seizures without mention of intractable epilepsy 11/04/2012    Non convulsive paroxysmal spells, responding to [facility], patient not driving.   Marland Kitchen GERD (gastroesophageal reflux disease)   . Vitamin D deficiency   . Gout   . Hypothyroidism   . Obesity (BMI 30-39.9)   . Hypertension   .  Pneumonia ~ 1943  . Type II or unspecified type diabetes mellitus without mention of complication, not stated as uncontrolled   . DM neuropathy, type II diabetes mellitus   . Epilepsy   . Arthritis     "knees; right shoulder" (09/15/2013)  . Basal cell carcinoma     "right upper outer lip"  . Skin cancer     "forehead; right hand"   Immunization History  Administered Date(s) Administered  . DTaP 03/18/2007  . Influenza, High Dose Seasonal PF 10/18/2013  . Influenza-Unspecified 11/18/2012  . Pneumococcal Polysaccharide-23 09/16/2013  . Zoster 02/28/2005   Past Surgical History  Procedure Laterality Date  . Tonsillectomy  ~ 1947  . Wrist surgery Left ~ 1950    "ran arm thru window"  . Cervical polypectomy    . Hammer toe surgery Left 1980's  . Mohs surgery  20087    "right upper outer lip"   FHx:    Reviewed / unchanged  SHx:    Reviewed / unchanged  Systems Review:  Constitutional: Denies fever, chills, wt changes, headaches, insomnia, fatigue, night sweats, change in appetite. Eyes: Denies redness, blurred vision, diplopia, discharge, itchy, watery eyes.  ENT: Denies discharge, congestion, post nasal drip, epistaxis, sore throat, earache, hearing loss, dental pain, tinnitus, vertigo, sinus pain, snoring.  CV: Denies chest pain, palpitations, irregular heartbeat, syncope, dyspnea, diaphoresis, orthopnea, PND, claudication or edema. Respiratory: denies cough, dyspnea, DOE, pleurisy, hoarseness, laryngitis, wheezing.  Gastrointestinal: Denies dysphagia, odynophagia, heartburn, reflux, water brash, abdominal pain or cramps, nausea, vomiting, bloating, diarrhea, constipation, hematemesis, melena, hematochezia  or hemorrhoids. Genitourinary: Denies dysuria, frequency, urgency, nocturia, hesitancy, discharge, hematuria or flank pain. Musculoskeletal: Denies arthralgias, myalgias, stiffness, jt. swelling, pain, limping or strain/sprain.  Skin: Denies pruritus, rash, hives, warts,  acne, eczema or change in skin lesion(s). Neuro: No weakness, tremor, incoordination, spasms, paresthesia or pain. Psychiatric: Denies confusion, memory loss or sensory loss. Endo: Denies change in weight, skin or hair change.  Heme/Lymph: No excessive bleeding, bruising or enlarged lymph nodes.  Physical Exam  BP 126/72   Pulse 76  Temp 97.5 F   Resp 16  Ht 5' 2.75"   Wt 218 lb 6.4 oz   BMI 38.99   Appears well nourished and in no distress. Eyes: PERRLA, EOMs, conjunctiva no swelling or erythema. Sinuses: No frontal/maxillary tenderness ENT/Mouth: EAC's clear, TM's nl w/o erythema, bulging. Nares clear w/o erythema, swelling, exudates. Oropharynx clear  without erythema or exudates. Oral hygiene is good. Tongue normal, non obstructing. Hearing intact.  Neck: Supple. Thyroid nl. Car 2+/2+ without bruits, nodes or JVD. Chest: Respirations nl with BS clear & equal w/o rales, rhonchi, wheezing or stridor.  Cor: Heart sounds normal w/ regular rate and rhythm without sig. murmurs, gallops, clicks, or rubs. Peripheral pulses normal and equal  without edema.  Abdomen: Soft & bowel sounds normal. Non-tender w/o guarding, rebound, hernias, masses, or organomegaly.  Lymphatics: Unremarkable.  Musculoskeletal: Full ROM all peripheral extremities, joint stability, 5/5 strength, and normal gait.  Skin: Warm, dry without exposed rashes, lesions or ecchymosis apparent.  Neuro: Cranial nerves intact, reflexes equal bilaterally. Sensory-motor testing grossly intact. Tendon reflexes grossly intact.  Pysch: Alert & oriented x 3.  Insight and judgement nl & appropriate. No ideations.  Assessment and Plan:  1. Hypertension - Continue monitor blood pressure at home. Continue diet/meds same.  2. Hyperlipidemia - Continue diet/meds, exercise,& lifestyle modifications. Continue monitor periodic cholesterol/liver & renal functions   3. T2_NIDDM w/CKD4 - Continue diet, exercise, lifestyle modifications.  Monitor appropriate labs.  4. Vitamin D Deficiency - Continue supplementation.  5. Atypical Seizures -     Recommended regular exercise, BP monitoring, weight control, and discussed med and SE's. Recommended labs to assess and monitor clinical status. Further disposition pending results of labs.

## 2014-01-25 LAB — HEPATIC FUNCTION PANEL
ALT: 20 U/L (ref 0–35)
AST: 16 U/L (ref 0–37)
Albumin: 4.3 g/dL (ref 3.5–5.2)
Alkaline Phosphatase: 32 U/L — ABNORMAL LOW (ref 39–117)
Bilirubin, Direct: 0.1 mg/dL (ref 0.0–0.3)
Indirect Bilirubin: 0.4 mg/dL (ref 0.2–1.2)
Total Bilirubin: 0.5 mg/dL (ref 0.2–1.2)
Total Protein: 6.6 g/dL (ref 6.0–8.3)

## 2014-01-25 LAB — INSULIN, FASTING: Insulin fasting, serum: 37.5 u[IU]/mL — ABNORMAL HIGH (ref 2.0–19.6)

## 2014-01-25 LAB — BASIC METABOLIC PANEL WITH GFR
BUN: 35 mg/dL — ABNORMAL HIGH (ref 6–23)
CO2: 24 mEq/L (ref 19–32)
Calcium: 10.2 mg/dL (ref 8.4–10.5)
Chloride: 105 mEq/L (ref 96–112)
Creat: 1.94 mg/dL — ABNORMAL HIGH (ref 0.50–1.10)
GFR, Est African American: 28 mL/min — ABNORMAL LOW
GFR, Est Non African American: 24 mL/min — ABNORMAL LOW
Glucose, Bld: 184 mg/dL — ABNORMAL HIGH (ref 70–99)
Potassium: 4.8 mEq/L (ref 3.5–5.3)
Sodium: 138 mEq/L (ref 135–145)

## 2014-01-25 LAB — LIPID PANEL
Cholesterol: 144 mg/dL (ref 0–200)
HDL: 30 mg/dL — ABNORMAL LOW (ref >39)
LDL Cholesterol: 69 mg/dL (ref 0–99)
Total CHOL/HDL Ratio: 4.8 Ratio
Triglycerides: 225 mg/dL — ABNORMAL HIGH (ref <150)
VLDL: 45 mg/dL — ABNORMAL HIGH (ref 0–40)

## 2014-01-25 LAB — VITAMIN D 25 HYDROXY (VIT D DEFICIENCY, FRACTURES): Vit D, 25-Hydroxy: 35 ng/mL (ref 30–100)

## 2014-01-25 LAB — TSH: TSH: 1.908 u[IU]/mL (ref 0.350–4.500)

## 2014-01-25 LAB — MAGNESIUM: Magnesium: 2.1 mg/dL (ref 1.5–2.5)

## 2014-01-27 LAB — LEVETIRACETAM LEVEL: Keppra (Levetiracetam): 32.6 ug/mL

## 2014-02-17 ENCOUNTER — Other Ambulatory Visit: Payer: Self-pay

## 2014-02-17 DIAGNOSIS — E039 Hypothyroidism, unspecified: Secondary | ICD-10-CM

## 2014-02-17 MED ORDER — LEVOTHYROXINE SODIUM 100 MCG PO TABS: 100.0000 ug | ORAL_TABLET | Freq: Every day | ORAL | Status: DC

## 2014-03-02 ENCOUNTER — Other Ambulatory Visit: Payer: Self-pay | Admitting: Internal Medicine

## 2014-03-19 ENCOUNTER — Other Ambulatory Visit: Payer: Self-pay | Admitting: Internal Medicine

## 2014-04-06 ENCOUNTER — Other Ambulatory Visit: Payer: Self-pay | Admitting: *Deleted

## 2014-04-06 MED ORDER — FENOFIBRATE MICRONIZED 134 MG PO CAPS
134.0000 mg | ORAL_CAPSULE | Freq: Every day | ORAL | Status: DC
Start: 2014-04-06 — End: 2014-08-08

## 2014-04-06 MED ORDER — MECLIZINE HCL 25 MG PO TABS: 25.0000 mg | ORAL_TABLET | Freq: Every day | ORAL | Status: DC

## 2014-05-02 ENCOUNTER — Encounter: Payer: Self-pay | Admitting: Physician Assistant

## 2014-05-02 ENCOUNTER — Ambulatory Visit (INDEPENDENT_AMBULATORY_CARE_PROVIDER_SITE_OTHER): Payer: Medicare Other | Admitting: Physician Assistant

## 2014-05-02 VITALS — BP 122/68 | HR 76 | Temp 97.7°F | Resp 16 | Ht 62.75 in | Wt 216.0 lb

## 2014-05-02 DIAGNOSIS — I10 Essential (primary) hypertension: Secondary | ICD-10-CM

## 2014-05-02 DIAGNOSIS — E782 Mixed hyperlipidemia: Secondary | ICD-10-CM

## 2014-05-02 DIAGNOSIS — R350 Frequency of micturition: Secondary | ICD-10-CM

## 2014-05-02 DIAGNOSIS — Z79899 Other long term (current) drug therapy: Secondary | ICD-10-CM

## 2014-05-02 DIAGNOSIS — M109 Gout, unspecified: Secondary | ICD-10-CM

## 2014-05-02 DIAGNOSIS — G4730 Sleep apnea, unspecified: Secondary | ICD-10-CM

## 2014-05-02 DIAGNOSIS — E1122 Type 2 diabetes mellitus with diabetic chronic kidney disease: Secondary | ICD-10-CM

## 2014-05-02 DIAGNOSIS — E1149 Type 2 diabetes mellitus with other diabetic neurological complication: Secondary | ICD-10-CM

## 2014-05-02 DIAGNOSIS — E669 Obesity, unspecified: Secondary | ICD-10-CM

## 2014-05-02 DIAGNOSIS — N184 Chronic kidney disease, stage 4 (severe): Secondary | ICD-10-CM

## 2014-05-02 DIAGNOSIS — E559 Vitamin D deficiency, unspecified: Secondary | ICD-10-CM

## 2014-05-02 DIAGNOSIS — M791 Myalgia, unspecified site: Secondary | ICD-10-CM

## 2014-05-02 NOTE — Progress Notes (Signed)
Assessment and Plan:  1. Hypertension -Continue medication, monitor blood pressure at home. Continue DASH diet.  Reminder to go to the ER if any CP, SOB, nausea, dizziness, severe HA, changes vision/speech, left arm numbness and tingling and jaw pain.  2. Cholesterol -Continue diet and exercise. Check cholesterol.   3. Diabetes with diabetic chronic kidney disease and with diabetic polyneuropathy -Continue diet and exercise. Check A1C  4. Vitamin D Def - check level and continue medications.   5. Obesity with co morbidities- long discussion about weight loss, diet, and exercise  6. Hypothyroidism-check TSH level, continue medications the same, reminded to take on an empty stomach 30-67mns before food.   7. Myalgias- stop fenofibrate for now, ? From Stage III CKD increase fluids, will check CPK, aldolase, TSH, ESR, magnesium, potassium, uric acid.   8. Urinary frequency Check urine  9. Sleep apnea-  Probable sleep apnea- get sleep study, weight loss advised.    Continue diet and meds as discussed. Further disposition pending results of labs. Discussed med's effects and SE's.   OVER 40 minutes of exam, counseling, chart review, referral performed   HPI 81y.o. female  presents for 3 month follow up  has a past medical history of Anemia, unspecified; Meniere's disease; UTI (urinary tract infection); High cholesterol; Other forms of epilepsy and recurrent seizures without mention of intractable epilepsy (11/04/2012); GERD (gastroesophageal reflux disease); Vitamin D deficiency; Gout; Hypothyroidism; Obesity (BMI 30-39.9); Hypertension; Pneumonia (~ 1943); Type II or unspecified type diabetes mellitus without mention of complication, not stated as uncontrolled; DM neuropathy, type II diabetes mellitus; Epilepsy; Arthritis; Basal cell carcinoma; and Skin cancer.  Her blood pressure has been controlled at home, today her BP is BP: 122/68 mmHg.  She does workout. She denies chest pain,  shortness of breath, dizziness.  She is on cholesterol medication, fenofibrate due to intolerance of statins and she is complaining of myalgias. Her cholesterol is at goal. The cholesterol was:   Lab Results  Component Value Date   CHOL 144 01/24/2014   HDL 30* 01/24/2014   LDLCALC 69 01/24/2014   TRIG 225* 01/24/2014   CHOLHDL 4.8 01/24/2014    She has been working on diet and exercise for diabetes with diabetic chronic kidney disease and with diabetic polyneuropathy, she is on bASA, she is not on ACE/ARB due to kidney function, She takes the MF 2 pills a day every day but depending on her numbers she will take the onglyza and amaryl and denies  hypoglycemia , polydipsia, polyuria and visual disturbances. Last A1C was:  Lab Results  Component Value Date   HGBA1C 7.3* 01/24/2014    Patient is on Vitamin D supplement. VIT D, 25-HYDROXY  Date Value Ref Range Status  01/24/2014 35 30 - 100 ng/mL Final   She is on thyroid medication. Her medication was not changed last visit.   Lab Results  Component Value Date   TSH 1.908 01/24/2014  She complains of cramps in her legs at night and "bone" pain, it has been intermittent for a long time but worse the last week or two. Denies neuropathic type pain.  BMI is Body mass index is 38.56 kg/(m^2)., she is working on diet and exercise. Wt Readings from Last 3 Encounters:  05/02/14 216 lb (97.977 kg)  01/24/14 218 lb 6.4 oz (99.066 kg)  11/08/13 219 lb (99.338 kg)    Current Medications:  Current Outpatient Prescriptions on File Prior to Visit  Medication Sig Dispense Refill  . acetaminophen (TYLENOL)  500 MG tablet Take 1,000 mg by mouth every 6 (six) hours as needed for moderate pain.    Marland Kitchen aspirin 81 MG tablet Take 81 mg by mouth daily.    Marland Kitchen atenolol (TENORMIN) 50 MG tablet Take 1 tablet (50 mg total) by mouth daily. 90 tablet 3  . Biotin 5000 MCG TABS Take 5,000 Units by mouth 2 (two) times daily.     . clobetasol cream (TEMOVATE) 0.05 %      . fenofibrate micronized (LOFIBRA) 134 MG capsule Take 1 capsule (134 mg total) by mouth at bedtime. 90 capsule 4  . ferrous sulfate dried (SLOW FE) 160 (50 FE) MG TBCR Take 160 mg by mouth 2 (two) times daily.     . fish oil-omega-3 fatty acids 1000 MG capsule Take 2 g by mouth at bedtime.     . Flaxseed, Linseed, (FLAX SEED OIL) 1000 MG CAPS Take 1 capsule by mouth 3 (three) times daily.    Marland Kitchen FREESTYLE INSULINX TEST test strip   0  . glimepiride (AMARYL) 4 MG tablet Take 1 tablet (4 mg total) by mouth daily with breakfast. 90 tablet 99  . Lancets (FREESTYLE) lancets   0  . lansoprazole (PREVACID) 30 MG capsule TAKE 1 BY MOUTH DAILY 90 capsule 99  . levETIRAcetam (KEPPRA) 500 MG tablet TAKE 1 BY MOUTH 3 TIMES DAILY AFTER MEALS 270 tablet 0  . levothyroxine (SYNTHROID, LEVOTHROID) 100 MCG tablet Take 1 tablet (100 mcg total) by mouth daily. 90 tablet 3  . loratadine (CLARITIN) 10 MG tablet Take 10 mg by mouth daily.    . magnesium oxide (MAG-OX) 400 MG tablet Take 400 mg by mouth 3 (three) times daily.     . meclizine (ANTIVERT) 25 MG tablet Take 1 tablet (25 mg total) by mouth daily. 90 tablet 3  . metFORMIN (GLUCOPHAGE-XR) 500 MG 24 hr tablet Take 1 tablet (500 mg total) by mouth 2 (two) times daily. 180 tablet 3  . saxagliptin HCl (ONGLYZA) 5 MG TABS tablet Take 1 tablet daily as directed for Diabetes 90 tablet 99  . vitamin B-12 (CYANOCOBALAMIN) 100 MCG tablet Take 50 mcg by mouth daily.    . vitamin C (ASCORBIC ACID) 500 MG tablet Take 500 mg by mouth daily.     No current facility-administered medications on file prior to visit.   Medical History:  Past Medical History  Diagnosis Date  . Anemia, unspecified   . Meniere's disease   . UTI (urinary tract infection)   . High cholesterol   . Other forms of epilepsy and recurrent seizures without mention of intractable epilepsy 11/04/2012    Non convulsive paroxysmal spells, responding to [facility], patient not driving.   Marland Kitchen GERD  (gastroesophageal reflux disease)   . Vitamin D deficiency   . Gout   . Hypothyroidism   . Obesity (BMI 30-39.9)   . Hypertension   . Pneumonia ~ 1943  . Type II or unspecified type diabetes mellitus without mention of complication, not stated as uncontrolled   . DM neuropathy, type II diabetes mellitus   . Epilepsy   . Arthritis     "knees; right shoulder" (09/15/2013)  . Basal cell carcinoma     "right upper outer lip"  . Skin cancer     "forehead; right hand"   Allergies:  Allergies  Allergen Reactions  . Ppd [Tuberculin Purified Protein Derivative] Other (See Comments)    indurated     Review of Systems:  Review of Systems  Constitutional: Negative.   Respiratory: Negative.   Cardiovascular: Negative.   Gastrointestinal: Negative.   Genitourinary: Positive for frequency. Negative for dysuria, urgency, hematuria and flank pain.  Musculoskeletal: Positive for myalgias and back pain. Negative for joint pain, falls and neck pain.  Skin: Negative.   Neurological: Negative.   Psychiatric/Behavioral: Negative.     Family history- Review and unchanged Social history- Review and unchanged Physical Exam: BP 122/68 mmHg  Pulse 76  Temp(Src) 97.7 F (36.5 C)  Resp 16  Ht 5' 2.75" (1.594 m)  Wt 216 lb (97.977 kg)  BMI 38.56 kg/m2 Wt Readings from Last 3 Encounters:  05/02/14 216 lb (97.977 kg)  01/24/14 218 lb 6.4 oz (99.066 kg)  11/08/13 219 lb (99.338 kg)   General Appearance: Well nourished, in no apparent distress. Eyes: PERRLA, EOMs, conjunctiva no swelling or erythema Sinuses: No Frontal/maxillary tenderness ENT/Mouth: Ext aud canals clear, TMs without erythema, bulging. No erythema, swelling, or exudate on post pharynx.  Tonsils not swollen or erythematous. Hearing normal.  Neck: Supple, thyroid normal.  Respiratory: Respiratory effort normal, BS equal bilaterally without rales, rhonchi, wheezing or stridor.  Cardio: RRR with no MRGs. Brisk peripheral pulses  without edema.  Abdomen: Soft, + BS.  Non tender, no guarding, rebound, hernias, masses. Lymphatics: Non tender without lymphadenopathy.  Musculoskeletal: Full ROM, 5/5 strength, Normal gait Skin: Warm, dry without rashes, lesions, ecchymosis.  Neuro: Cranial nerves intact. No cerebellar symptoms.  Psych: Awake and oriented X 3, normal affect, Insight and Judgment appropriate.    Vicie Mutters, PA-C 10:17 AM [facility] [facility]

## 2014-05-02 NOTE — Patient Instructions (Signed)
Stop fenofibrate for now to see if this helps your muscle aches  Diabetes is a very complicated disease...lets simplify it.  An easy way to look at it to understand the complications is if you think of the extra sugar floating in your blood stream as glass shards floating through your blood stream.    Diabetes affects your small vessels first: 1) The glass shards (sugar) scraps down the tiny blood vessels in your eyes and lead to diabetic retinopathy, the leading cause of blindness in the [place]. Diabetes is the leading cause of newly diagnosed adult (36 to 79 years of age) blindness in the [place].  2) The glass shards scratches down the tiny vessels of your legs leading to nerve damage called neuropathy and can lead to amputations of your feet. More than 60% of all non-traumatic amputations of lower limbs occur in people with diabetes.  3) Over time the small vessels in your brain are shredded and closed off, individually this does not cause any problems but over a long period of time many of the small vessels being blocked can lead to Vascular Dementia.   4) Your kidney's are a filter system and have a "net" that keeps certain things in the body and lets bad things out. Sugar shreds this net and leads to kidney damage and eventually failure. Decreasing the sugar that is destroying the net and certain blood pressure medications can help stop or decrease progression of kidney disease. Diabetes was the primary cause of kidney failure in 44 percent of all new cases in 2011.  5) Diabetes also destroys the small vessels in your penis that lead to erectile dysfunction. Eventually the vessels are so damaged that you may not be responsive to cialis or viagra.   Diabetes and your large vessels: Your larger vessels consist of your coronary arteries in your heart and the carotid vessels to your brain. Diabetes or even increased sugars put you at 300% increased risk of heart attack and stroke and this is  why.. The sugar scrapes down your large blood vessels and your body sees this as an internal injury and tries to repair itself. Just like you get a scab on your skin, your platelets will stick to the blood vessel wall trying to heal it. This is why we have diabetics on low dose aspirin daily, this prevents the platelets from sticking and can prevent plaque formation. In addition, your body takes cholesterol and tries to shove it into the open wound. This is why we want your LDL, or bad cholesterol, below 70.   The combination of platelets and cholesterol over 5-10 years forms plaque that can break off and cause a heart attack or stroke.   PLEASE REMEMBER:  Diabetes is preventable! Up to 16 percent of complications and morbidities among individuals with type 2 diabetes can be prevented, delayed, or effectively treated and minimized with regular visits to a health professional, appropriate monitoring and medication, and a healthy diet and lifestyle.  Recommendations For Diabetic/Prediabetic Patients:   -  Take medications as prescribed  -  Recommend Dr Fara Olden Fuhrman's book "The End of Diabetes "  And "The End of Dieting"- Can get at  www.[employer].com and encourage also get the Audio CD book  - AVOID Animal products, ie. Meat - red/white, Poultry and Dairy/especially cheese - Exercise at least 5 times a week for 30 minutes or preferably daily.  - No Smoking - Drink less than 2 drinks a day.  - Monitor your  feet for sores - Have yearly Eye Exams - Recommend annual Flu vaccine  - Recommend Pneumovax and Prevnar vaccines - Shingles Vaccine (Zostavax) if over 21 y.o.  Goals:   - BMI less than 24 - Fasting sugar less than 130 or less than 150 if tapering medicines to lose weight  - Systolic BP less than [id number]  - Diastolic BP less than 80 - Bad LDL Cholesterol less than 70 - Triglycerides less than 150  Before you even begin to attack a weight-loss plan, it pays to remember this: You are not fat.  You have fat. Losing weight isn't about blame or shame; it's simply another achievement to accomplish. Dieting is like any other skill-you have to buckle down and work at it. As long as you act in a smart, reasonable way, you'll ultimately get where you want to be. Here are some weight loss pearls for you.  1. It's Not a Diet. It's a Lifestyle Thinking of a diet as something you're on and suffering through only for the short term doesn't work. To shed weight and keep it off, you need to make permanent changes to the way you eat. It's OK to indulge occasionally, of course, but if you cut calories temporarily and then revert to your old way of eating, you'll gain back the weight quicker than you can say yo-yo. Use it to lose it. Research shows that one of the best predictors of long-term weight loss is how many pounds you drop in the first month. For that reason, nutritionists often suggest being stricter for the first two weeks of your new eating strategy to build momentum. Cut out added sugar and alcohol and avoid unrefined carbs. After that, figure out how you can reincorporate them in a way that's healthy and maintainable.  2. There's a Right Way to Exercise Working out burns calories and fat and boosts your metabolism by building muscle. But those trying to lose weight are notorious for overestimating the number of calories they burn and underestimating the amount they take in. Unfortunately, your system is biologically programmed to hold on to extra pounds and that means when you start exercising, your body senses the deficit and ramps up its hunger signals. If you're not diligent, you'll eat everything you burn and then some. Use it to lose it. Cardio gets all the exercise glory, but strength and interval training are the real heroes. They help you build lean muscle, which in turn increases your metabolism and calorie-burning ability 3. Don't Overreact to Mild Hunger Some people have a hard time losing  weight because of hunger anxiety. To them, being hungry is bad-something to be avoided at all costs-so they carry snacks with them and eat when they don't need to. Others eat because they're stressed out or bored. While you never want to get to the point of being ravenous (that's when bingeing is likely to happen), a hunger pang, a craving, or the fact that it's 3:00 p.m. should not send you racing for the vending machine or obsessing about the energy bar in your purse. Ideally, you should put off eating until your stomach is growling and it's difficult to concentrate.  Use it to lose it. When you feel the urge to eat, use the HALT method. Ask yourself, Am I really hungry? Or am I angry or anxious, lonely or bored, or tired? If you're still not certain, try the apple test. If you're truly hungry, an apple should seem delicious; if it doesn't, something  else is going on. Or you can try drinking water and making yourself busy, if you are still hungry try a healthy snack.  4. Not All Calories Are Created Equal The mechanics of weight loss are pretty simple: Take in fewer calories than you use for energy. But the kind of food you eat makes all the difference. Processed food that's high in saturated fat and refined starch or sugar can cause inflammation that disrupts the hormone signals that tell your brain you're full. The result: You eat a lot more.  Use it to lose it. Clean up your diet. Swap in whole, unprocessed foods, including vegetables, lean protein, and healthy fats that will fill you up and give you the biggest nutritional bang for your calorie buck. In a few weeks, as your brain starts receiving regular hunger and fullness signals once again, you'll notice that you feel less hungry overall and naturally start cutting back on the amount you eat.  5. Protein, Produce, and Plant-Based Fats Are Your Weight-Loss Trinity Here's why eating the three Ps regularly will help you drop pounds. Protein fills you  up. You need it to build lean muscle, which keeps your metabolism humming so that you can torch more fat. People in a weight-loss program who ate double the recommended daily allowance for protein (about 110 grams for a 150-pound woman) lost 70 percent of their weight from fat, while people who ate the RDA lost only about 40 percent, one study found. Produce is packed with filling fiber. "It's very difficult to consume too many calories if you're eating a lot of vegetables. Example: Three cups of broccoli is a lot of food, yet only 93 calories. (Fruit is another story. It can be easy to overeat and can contain a lot of calories from sugar, so be sure to monitor your intake.) Plant-based fats like olive oil and those in avocados and nuts are healthy and extra satiating.  Use it to lose it. Aim to incorporate each of the three Ps into every meal and snack. People who eat protein throughout the day are able to keep weight off, according to a study in the [place] of Clinical Nutrition. In addition to meat, poultry and seafood, good sources are beans, lentils, eggs, tofu, and yogurt. As for fat, keep portion sizes in check by measuring out salad dressing, oil, and nut butters (shoot for one to two tablespoons). Finally, eat veggies or a little fruit at every meal. People who did that consumed 308 fewer calories but didn't feel any hungrier than when they didn't eat more produce.  7. How You Eat Is As Important As What You Eat In order for your brain to register that you're full, you need to focus on what you're eating. Sit down whenever you eat, preferably at a table. Turn off the TV or computer, put down your phone, and look at your food. Smell it. Chew slowly, and don't put another bite on your fork until you swallow. When women ate lunch this attentively, they consumed 30 percent less when snacking later than those who listened to an audiobook at lunchtime, according to a study in the [place] of  Nutrition. 8. Weighing Yourself Really Works The scale provides the best evidence about whether your efforts are paying off. Seeing the numbers tick up or down or stagnate is motivation to keep going-or to rethink your approach. A 2015 study at [facility] found that daily weigh-ins helped people lose more weight, keep it off,  and maintain that loss, even after two years. Use it to lose it. Step on the scale at the same time every day for the best results. If your weight shoots up several pounds from one weigh-in to the next, don't freak out. Eating a lot of salt the night before or having your period is the likely culprit. The number should return to normal in a day or two. It's a steady climb that you need to do something about. 9. Too Much Stress and Too Little Sleep Are Your Enemies When you're tired and frazzled, your body cranks up the production of cortisol, the stress hormone that can cause carb cravings. Not getting enough sleep also boosts your levels of ghrelin, a hormone associated with hunger, while suppressing leptin, a hormone that signals fullness and satiety. People on a diet who slept only five and a half hours a night for two weeks lost 55 percent less fat and were hungrier than those who slept eight and a half hours, according to a study in the [place]. Use it to lose it. Prioritize sleep, aiming for seven hours or more a night, which research shows helps lower stress. And make sure you're getting quality zzz's. If a snoring spouse or a fidgety cat wakes you up frequently throughout the night, you may end up getting the equivalent of just four hours of sleep, according to a study from [facility]. Keep pets out of the bedroom, and use a white-noise app to drown out snoring. 10. You Will Hit a plateau-And You Can Bust Through It As you slim down, your body releases much less leptin, the fullness hormone.  If you're not strength training, start  right now. Building muscle can raise your metabolism to help you overcome a plateau. To keep your body challenged and burning calories, incorporate new moves and more intense intervals into your workouts or add another sweat session to your weekly routine. Alternatively, cut an extra 100 calories or so a day from your diet. Now that you've lost weight, your body simply doesn't need as much fuel.   I think it is possible that you have sleep apnea. It can cause interrupted sleep, headaches, frequent awakenings, fatigue, dry mouth, fast/slow heart beats, memory issues, anxiety/depression, swelling, numbness tingling hands/feet, weight gain, shortness of breath, and the list goes on. Sleep apnea needs to be ruled out because if it is left untreated it does eventually lead to abnormal heart beats, lung failure or heart failure as well as increasing the risk of heart attack and stroke. There are masks you can wear OR a mouth piece that I can give you information about. Often times though people feel MUCH better after getting treatment.   Sleep Apnea  Sleep apnea is a sleep disorder characterized by abnormal pauses in breathing while you sleep. When your breathing pauses, the level of oxygen in your blood decreases. This causes you to move out of deep sleep and into light sleep. As a result, your quality of sleep is poor, and the system that carries your blood throughout your body (cardiovascular system) experiences stress. If sleep apnea remains untreated, the following conditions can develop:  High blood pressure (hypertension).  Coronary artery disease.  Inability to achieve or maintain an erection (impotence).  Impairment of your thought process (cognitive dysfunction). There are three types of sleep apnea: 1. Obstructive sleep apnea--Pauses in breathing during sleep because of a blocked airway. 2. Central sleep apnea--Pauses in breathing during sleep because  the area of the brain that controls your  breathing does not send the correct signals to the muscles that control breathing. 3. Mixed sleep apnea--A combination of both obstructive and central sleep apnea.  RISK FACTORS The following risk factors can increase your risk of developing sleep apnea:  Being overweight.  Smoking.  Having narrow passages in your nose and throat.  Being of older age.  Being female.  Alcohol use.  Sedative and tranquilizer use.  Ethnicity. Among individuals younger than 35 years, African Americans are at increased risk of sleep apnea. SYMPTOMS   Difficulty staying asleep.  Daytime sleepiness and fatigue.  Loss of energy.  Irritability.  Loud, heavy snoring.  Morning headaches.  Trouble concentrating.  Forgetfulness.  Decreased interest in sex. DIAGNOSIS  In order to diagnose sleep apnea, your caregiver will perform a physical examination. Your caregiver may suggest that you take a home sleep test. Your caregiver may also recommend that you spend the night in a sleep lab. In the sleep lab, several monitors record information about your heart, lungs, and brain while you sleep. Your leg and arm movements and blood oxygen level are also recorded. TREATMENT The following actions may help to resolve mild sleep apnea:  Sleeping on your side.   Using a decongestant if you have nasal congestion.   Avoiding the use of depressants, including alcohol, sedatives, and narcotics.   Losing weight and modifying your diet if you are overweight. There also are devices and treatments to help open your airway:  Oral appliances. These are custom-made mouthpieces that shift your lower jaw forward and slightly open your bite. This opens your airway.  Devices that create positive airway pressure. This positive pressure "splints" your airway open to help you breathe better during sleep. The following devices create positive airway pressure:  Continuous positive airway pressure (CPAP) device. The CPAP  device creates a continuous level of air pressure with an air pump. The air is delivered to your airway through a mask while you sleep. This continuous pressure keeps your airway open.  Nasal expiratory positive airway pressure (EPAP) device. The EPAP device creates positive air pressure as you exhale. The device consists of single-use valves, which are inserted into each nostril and held in place by adhesive. The valves create very little resistance when you inhale but create much more resistance when you exhale. That increased resistance creates the positive airway pressure. This positive pressure while you exhale keeps your airway open, making it easier to breath when you inhale again.  Bilevel positive airway pressure (BPAP) device. The BPAP device is used mainly in patients with central sleep apnea. This device is similar to the CPAP device because it also uses an air pump to deliver continuous air pressure through a mask. However, with the BPAP machine, the pressure is set at two different levels. The pressure when you exhale is lower than the pressure when you inhale.  Surgery. Typically, surgery is only done if you cannot comply with less invasive treatments or if the less invasive treatments do not improve your condition. Surgery involves removing excess tissue in your airway to create a wider passage way. Document Released: 01/25/2002 Document Revised: 06/01/2012 Document Reviewed: 06/13/2011 [facility] Patient Information 2015 [place], [place]. This information is not intended to replace advice given to you by your health care provider. Make sure you discuss any questions you have with your health care provider.

## 2014-05-03 LAB — URINALYSIS, ROUTINE W REFLEX MICROSCOPIC
Bilirubin Urine: NEGATIVE
Glucose, UA: NEGATIVE mg/dL
Ketones, ur: NEGATIVE mg/dL
Nitrite: NEGATIVE
Protein, ur: NEGATIVE mg/dL
Specific Gravity, Urine: 1.012 (ref 1.005–1.030)
Urobilinogen, UA: 0.2 mg/dL (ref 0.0–1.0)
pH: 5.5 (ref 5.0–8.0)

## 2014-05-03 LAB — CBC WITH DIFFERENTIAL/PLATELET
Basophils Absolute: 0 10*3/uL (ref 0.0–0.1)
Basophils Relative: 0 % (ref 0–1)
Eosinophils Absolute: 0.6 10*3/uL (ref 0.0–0.7)
Eosinophils Relative: 9 % — ABNORMAL HIGH (ref 0–5)
HCT: 39.3 % (ref 36.0–46.0)
Hemoglobin: 12.6 g/dL (ref 12.0–15.0)
Lymphocytes Relative: 19 % (ref 12–46)
Lymphs Abs: 1.3 10*3/uL (ref 0.7–4.0)
MCH: 29.2 pg (ref 26.0–34.0)
MCHC: 32.1 g/dL (ref 30.0–36.0)
MCV: 91 fL (ref 78.0–100.0)
MPV: 10.6 fL (ref 8.6–12.4)
Monocytes Absolute: 0.7 10*3/uL (ref 0.1–1.0)
Monocytes Relative: 10 % (ref 3–12)
Neutro Abs: 4.3 10*3/uL (ref 1.7–7.7)
Neutrophils Relative %: 62 % (ref 43–77)
Platelets: 261 10*3/uL (ref 150–400)
RBC: 4.32 MIL/uL (ref 3.87–5.11)
RDW: 15.1 % (ref 11.5–15.5)
WBC: 6.9 10*3/uL (ref 4.0–10.5)

## 2014-05-03 LAB — URINALYSIS, MICROSCOPIC ONLY
Bacteria, UA: NONE SEEN
Casts: NONE SEEN
Crystals: NONE SEEN
Squamous Epithelial / LPF: NONE SEEN
WBC, UA: >50 WBC/hpf — AB (ref <3)

## 2014-05-03 LAB — URIC ACID: Uric Acid, Serum: 7.3 mg/dL — ABNORMAL HIGH (ref 2.4–7.0)

## 2014-05-03 LAB — BASIC METABOLIC PANEL WITH GFR
BUN: 39 mg/dL — ABNORMAL HIGH (ref 6–23)
CO2: 24 mEq/L (ref 19–32)
Calcium: 10.6 mg/dL — ABNORMAL HIGH (ref 8.4–10.5)
Chloride: 105 mEq/L (ref 96–112)
Creat: 2.17 mg/dL — ABNORMAL HIGH (ref 0.50–1.10)
GFR, Est African American: 24 mL/min — ABNORMAL LOW
GFR, Est Non African American: 21 mL/min — ABNORMAL LOW
Glucose, Bld: 131 mg/dL — ABNORMAL HIGH (ref 70–99)
Potassium: 4.9 mEq/L (ref 3.5–5.3)
Sodium: 139 mEq/L (ref 135–145)

## 2014-05-03 LAB — HEMOGLOBIN A1C
Hgb A1c MFr Bld: 6.9 % — ABNORMAL HIGH (ref <5.7)
Mean Plasma Glucose: 151 mg/dL — ABNORMAL HIGH (ref <117)

## 2014-05-03 LAB — HEPATIC FUNCTION PANEL
ALT: 20 U/L (ref 0–35)
AST: 17 U/L (ref 0–37)
Albumin: 4.3 g/dL (ref 3.5–5.2)
Alkaline Phosphatase: 30 U/L — ABNORMAL LOW (ref 39–117)
Bilirubin, Direct: 0.1 mg/dL (ref 0.0–0.3)
Indirect Bilirubin: 0.3 mg/dL (ref 0.2–1.2)
Total Bilirubin: 0.4 mg/dL (ref 0.2–1.2)
Total Protein: 6.7 g/dL (ref 6.0–8.3)

## 2014-05-03 LAB — LIPID PANEL
Cholesterol: 156 mg/dL (ref 0–200)
HDL: 23 mg/dL — ABNORMAL LOW (ref >=46)
LDL Cholesterol: 82 mg/dL (ref 0–99)
Total CHOL/HDL Ratio: 6.8 Ratio
Triglycerides: 257 mg/dL — ABNORMAL HIGH (ref <150)
VLDL: 51 mg/dL — ABNORMAL HIGH (ref 0–40)

## 2014-05-03 LAB — MAGNESIUM: Magnesium: 2.2 mg/dL (ref 1.5–2.5)

## 2014-05-03 LAB — CK: Total CK: 39 U/L (ref 7–177)

## 2014-05-03 LAB — IRON AND TIBC
%SAT: 29 % (ref 20–55)
Iron: 104 ug/dL (ref 42–145)
TIBC: 363 ug/dL (ref 250–470)
UIBC: 259 ug/dL (ref 125–400)

## 2014-05-03 LAB — FERRITIN: Ferritin: 82 ng/mL (ref 10–291)

## 2014-05-03 LAB — VITAMIN D 25 HYDROXY (VIT D DEFICIENCY, FRACTURES): Vit D, 25-Hydroxy: 63 ng/mL (ref 30–100)

## 2014-05-03 LAB — TSH: TSH: 1.656 u[IU]/mL (ref 0.350–4.500)

## 2014-05-03 LAB — SEDIMENTATION RATE: Sed Rate: 1 mm/hr (ref 0–30)

## 2014-05-04 LAB — URINE CULTURE: Colony Count: 50000

## 2014-05-05 MED ORDER — CIPROFLOXACIN HCL 500 MG PO TABS: 500.0000 mg | ORAL_TABLET | Freq: Two times a day (BID) | ORAL | Status: AC

## 2014-05-05 NOTE — Addendum Note (Signed)
Addended by: Vicie Mutters R on: 05/05/2014 08:08 AM   Modules accepted: Orders

## 2014-06-06 ENCOUNTER — Ambulatory Visit (INDEPENDENT_AMBULATORY_CARE_PROVIDER_SITE_OTHER): Payer: Medicare Other | Admitting: *Deleted

## 2014-06-06 DIAGNOSIS — N390 Urinary tract infection, site not specified: Secondary | ICD-10-CM

## 2014-06-06 LAB — URINALYSIS, ROUTINE W REFLEX MICROSCOPIC
Bilirubin Urine: NEGATIVE
Glucose, UA: NEGATIVE mg/dL
Hgb urine dipstick: NEGATIVE
Ketones, ur: NEGATIVE mg/dL
Nitrite: NEGATIVE
Protein, ur: NEGATIVE mg/dL
Specific Gravity, Urine: 1.008 (ref 1.005–1.030)
Urobilinogen, UA: 0.2 mg/dL (ref 0.0–1.0)
pH: 5.5 (ref 5.0–8.0)

## 2014-06-06 NOTE — Progress Notes (Signed)
Patient ID: [name] [name], female   DOB: 1932/07/07, 81 y.o.   MRN: [id number] Patient presents for 1 month recheck UA, C&S.  Patient completed abx AD.

## 2014-06-07 LAB — URINALYSIS, MICROSCOPIC ONLY
Bacteria, UA: NONE SEEN
Casts: NONE SEEN
Crystals: NONE SEEN
Squamous Epithelial / LPF: NONE SEEN

## 2014-06-08 LAB — URINE CULTURE
Colony Count: NO GROWTH
Organism ID, Bacteria: NO GROWTH

## 2014-06-30 ENCOUNTER — Encounter: Payer: Self-pay | Admitting: Internal Medicine

## 2014-08-04 ENCOUNTER — Other Ambulatory Visit: Payer: Self-pay

## 2014-08-04 MED ORDER — LEVETIRACETAM 500 MG PO TABS: 500.0000 mg | ORAL_TABLET | Freq: Three times a day (TID) | ORAL | Status: DC

## 2014-08-04 MED ORDER — LEVETIRACETAM 500 MG PO TABS: ORAL_TABLET | ORAL | Status: DC

## 2014-08-08 ENCOUNTER — Ambulatory Visit (INDEPENDENT_AMBULATORY_CARE_PROVIDER_SITE_OTHER): Payer: Medicare Other | Admitting: Internal Medicine

## 2014-08-08 ENCOUNTER — Encounter: Payer: Self-pay | Admitting: Internal Medicine

## 2014-08-08 ENCOUNTER — Encounter: Payer: Self-pay | Admitting: *Deleted

## 2014-08-08 ENCOUNTER — Other Ambulatory Visit: Payer: Self-pay | Admitting: Physician Assistant

## 2014-08-08 VITALS — BP 122/74 | HR 80 | Temp 97.9°F | Resp 16 | Ht 62.0 in | Wt 215.2 lb

## 2014-08-08 DIAGNOSIS — E1121 Type 2 diabetes mellitus with diabetic nephropathy: Secondary | ICD-10-CM

## 2014-08-08 DIAGNOSIS — E559 Vitamin D deficiency, unspecified: Secondary | ICD-10-CM

## 2014-08-08 DIAGNOSIS — Z9989 Dependence on other enabling machines and devices: Secondary | ICD-10-CM

## 2014-08-08 DIAGNOSIS — G4733 Obstructive sleep apnea (adult) (pediatric): Secondary | ICD-10-CM

## 2014-08-08 DIAGNOSIS — Z79899 Other long term (current) drug therapy: Secondary | ICD-10-CM

## 2014-08-08 DIAGNOSIS — E782 Mixed hyperlipidemia: Secondary | ICD-10-CM

## 2014-08-08 DIAGNOSIS — I10 Essential (primary) hypertension: Secondary | ICD-10-CM

## 2014-08-08 LAB — CBC WITH DIFFERENTIAL/PLATELET
Basophils Absolute: 0.1 10*3/uL (ref 0.0–0.1)
Basophils Relative: 1 % (ref 0–1)
Eosinophils Absolute: 0.6 10*3/uL (ref 0.0–0.7)
Eosinophils Relative: 11 % — ABNORMAL HIGH (ref 0–5)
HCT: 39.3 % (ref 36.0–46.0)
Hemoglobin: 12.6 g/dL (ref 12.0–15.0)
Lymphocytes Relative: 25 % (ref 12–46)
Lymphs Abs: 1.3 10*3/uL (ref 0.7–4.0)
MCH: 28.8 pg (ref 26.0–34.0)
MCHC: 32.1 g/dL (ref 30.0–36.0)
MCV: 89.7 fL (ref 78.0–100.0)
MPV: 10.6 fL (ref 8.6–12.4)
Monocytes Absolute: 0.5 10*3/uL (ref 0.1–1.0)
Monocytes Relative: 9 % (ref 3–12)
Neutro Abs: 2.9 10*3/uL (ref 1.7–7.7)
Neutrophils Relative %: 54 % (ref 43–77)
Platelets: 256 10*3/uL (ref 150–400)
RBC: 4.38 MIL/uL (ref 3.87–5.11)
RDW: 15 % (ref 11.5–15.5)
WBC: 5.3 10*3/uL (ref 4.0–10.5)

## 2014-08-08 LAB — MAGNESIUM: Magnesium: 2 mg/dL (ref 1.5–2.5)

## 2014-08-08 LAB — BASIC METABOLIC PANEL WITH GFR
BUN: 24 mg/dL — ABNORMAL HIGH (ref 6–23)
CO2: 25 mEq/L (ref 19–32)
Calcium: 10.5 mg/dL (ref 8.4–10.5)
Chloride: 104 mEq/L (ref 96–112)
Creat: 1.56 mg/dL — ABNORMAL HIGH (ref 0.50–1.10)
GFR, Est African American: 36 mL/min — ABNORMAL LOW
GFR, Est Non African American: 31 mL/min — ABNORMAL LOW
Glucose, Bld: 129 mg/dL — ABNORMAL HIGH (ref 70–99)
Potassium: 4.6 mEq/L (ref 3.5–5.3)
Sodium: 143 mEq/L (ref 135–145)

## 2014-08-08 LAB — LIPID PANEL
Cholesterol: 166 mg/dL (ref 0–200)
HDL: 25 mg/dL — ABNORMAL LOW (ref >=46)
LDL Cholesterol: 92 mg/dL (ref 0–99)
Total CHOL/HDL Ratio: 6.6 Ratio
Triglycerides: 245 mg/dL — ABNORMAL HIGH (ref <150)
VLDL: 49 mg/dL — ABNORMAL HIGH (ref 0–40)

## 2014-08-08 LAB — HEMOGLOBIN A1C
Hgb A1c MFr Bld: 6.6 % — ABNORMAL HIGH (ref <5.7)
Mean Plasma Glucose: 143 mg/dL — ABNORMAL HIGH (ref <117)

## 2014-08-08 LAB — TSH: TSH: 1.399 u[IU]/mL (ref 0.350–4.500)

## 2014-08-08 LAB — HEPATIC FUNCTION PANEL
ALT: 35 U/L (ref 0–35)
AST: 21 U/L (ref 0–37)
Albumin: 4.4 g/dL (ref 3.5–5.2)
Alkaline Phosphatase: 41 U/L (ref 39–117)
Bilirubin, Direct: <0.1 mg/dL (ref 0.0–0.3)
Total Bilirubin: 0.4 mg/dL (ref 0.2–1.2)
Total Protein: 6.5 g/dL (ref 6.0–8.3)

## 2014-08-08 NOTE — Patient Instructions (Signed)
Recommend Adult Low dose Aspirin or baby Aspirin 81 mg daily   To reduce risk of Colon Cancer 20 %,   Skin Cancer 26 % ,   Melanoma 46%   and   Pancreatic cancer 60%  ++++++++++++++++++ vitamin   Recommend the book "The END of DIETING" by Dr Excell Seltzer   & the book "The END of DIABETES " by Dr Excell Seltzer  At [email or web address] - get book & Audio CD's     Being diabetic has a  300% increased risk for heart attack, stroke, cancer, and alzheimer- type vascular dementia. It is very important that you work harder with diet by avoiding all foods that are white. Avoid white rice (brown & wild rice is OK), white potatoes (sweetpotatoes in moderation is OK), White bread or wheat bread or anything made out of white flour like bagels, donuts, rolls, buns, biscuits, cakes, pastries, cookies, pizza crust, and pasta (made from white flour & egg whites) - vegetarian pasta or spinach or wheat pasta is OK. Multigrain breads like Arnold's or [employer], or multigrain sandwich thins or flatbreads.  Diet, exercise and weight loss can reverse and cure diabetes in the early stages.  Diet, exercise and weight loss is very important in the control and prevention of complications of diabetes which affects every system in your body, ie. Brain - dementia/stroke, eyes - glaucoma/blindness, heart - heart attack/heart failure, kidneys - dialysis, stomach - gastric paralysis, intestines - malabsorption, nerves - severe painful neuritis, circulation - gangrene & loss of a leg(s), and finally cancer and Alzheimers.    I recommend avoid fried & greasy foods,  sweets/candy, white rice (brown or wild rice or Quinoa is OK), white potatoes (sweet potatoes are OK) - anything made from white flour - bagels, doughnuts, rolls, buns, biscuits,white and wheat breads, pizza crust and traditional pasta made of white flour & egg white(vegetarian pasta or spinach or wheat pasta is OK).  Multi-grain bread is OK - like multi-grain flat  bread or sandwich thins. Avoid alcohol in excess. Exercise is also important.    Eat all the vegetables you want - avoid meat, especially red meat and dairy - especially cheese.  Cheese is the most concentrated form of trans-fats which is the worst thing to clog up our arteries. Veggie cheese is OK which can be found in the fresh produce section at [employer] or [employer] or [employer]  ++++++++++++++++++++++++++  CPAP and BIPAP Information CPAP and BIPAP are methods of helping you breathe with the use of air pressure. CPAP stands for "continuous positive airway pressure." BIPAP stands for "bi-level positive airway pressure." In both methods, air is blown into your air passages to help keep you breathing well. With CPAP, the amount of pressure stays the same while you breathe in and out. CPAP is most commonly used for obstructive sleep apnea. For obstructive sleep apnea, CPAP works by holding your airways open so that they do not collapse when your muscles relax during sleep. BIPAP is similar to CPAP except the amount of pressure is increased when you inhale. This helps you take larger breaths. Your health care provider will recommend whether CPAP or BIPAP would be more helpful for you.  WHY ARE CPAP AND BIPAP TREATMENTS USED? CPAP or BIPAP can be helpful if you have:   Sleep apnea.   Chronic obstructive pulmonary disease (COPD).   Diseases that weaken the muscles of the chest, including muscular dystrophy or neurological diseases such as amyotrophic lateral sclerosis (ALS).  Other problems that cause breathing to be weak, abnormal, or difficult.  HOW IS CPAP OR BIPAP ADMINISTERED? Both CPAP and BIPAP are provided by a small machine with a flexible plastic tube that attaches to a plastic mask. The mask fits on your face, and air is blown into your air passages through your nose or mouth. The amount of pressure that is used to blow the air into your air passages can be set on the machine.  Your health care provider will determine the pressure setting that should be used based on your individual needs. WHEN SHOULD CPAP OR BIPAP BE USED? In most cases, the mask is worn only when sleeping. Generally, you will need to wear the mask throughout the night and during the daytime if you take a nap. In a few cases involving certain medical conditions, people also need to wear the mask at other times when they are awake. Follow your health care provider's instructions for when to use the machine.  USING THE MASK  Because the mask needs to be snug, some people feel a trapped or closed-in feeling (claustrophobic) when first using the mask. You may need to get used to the mask gradually. To do this, you can first hold the mask loosely over your nose or mouth. Gradually apply the mask more snugly. You can also gradually increase the amount of time that you use the mask.  Masks are available in various types and sizes. Some fit over your mouth and nose, and some fit over just your nose. If your mask does not fit well, talk to your health care provider about getting a different one.  If you are using a nasal mask and you tend to breathe through your mouth, a chin strap may be applied to help keep your mouth closed.   The CPAP and BIPAP machines have alarms that may sound if the mask comes off or develops a leak.  If you have trouble with the mask, it is very important that you talk to your health care provider about finding a way to make the mask easier to tolerate. Do not stop using the mask. This could have a negative impact on your health. TIPS FOR USING THE MACHINE  Place your CPAP or BIPAP machine on a secure table or stand near an electrical outlet.   Know where the on-off switch is located on the machine.  Follow your health care provider's instructions for how to set the pressure on your machine and when you should use it.  Do not eat or drink while the CPAP or BIPAP machine is on. Food  or fluids could get pushed into your lungs by the pressure of the CPAP or BIPAP.  Do not smoke. Tobacco smoke residue can damage the machine.   For home use, CPAP and BIPAP machines can be rented or purchased through home health care companies. Many different brands of machines are available. Renting a machine before purchasing may help you find out which particular machine works well for you. SEEK IMMEDIATE MEDICAL CARE IF:  You have redness or open areas around your nose or mouth where the mask fits.   You have trouble operating the CPAP or BIPAP machine.   You cannot tolerate wearing the CPAP or BIPAP mask.  Document Released: 11/03/2003 Document Revised: 06/21/2013 Document Reviewed: 09/03/2012 [facility] Patient Information 2015 [place], [place]. This information is not intended to replace advice given to you by your health care provider. Make sure you discuss any questions you  have with your health care provider.  

## 2014-08-08 NOTE — Progress Notes (Signed)
Patient ID: [name] [name], female   DOB: 14-Jun-1932, 81 y.o.   MRN: [id number]   This very nice 81 y.o. [facility] presents for 3 month follow up with Hypertension, Hyperlipidemia, Pre-Diabetes and Vitamin D Deficiency. In April Sleep study was (+) for SAS with Arousal index 34.09 in NREM & with AHI 22.7 in REM and CPAP titration was titrated to 9 m and order was faxed on 06/22/2014 , but patient relates that [facility] never contacted her back.     Patient is treated for HTN since 1999 & BP has been controlled at home. Today's BP: 122/74 mmHg. Patient has had no complaints of any cardiac type chest pain, palpitations, dyspnea/orthopnea/PND, dizziness, claudication, or dependent edema.   Hyperlipidemia is controlled with diet & meds. Patient denies myalgias or other med SE's. Last Lipids were at goal -  Chol 156; HDL 23*; LDL  82; but with elevated  Triglycerides 257 on 05/02/2014:   Also, the patient has history of T2_NIDDM since 2001 w/ CKD 4 (GFR 29 ml/min)  and has had no symptoms of reactive hypoglycemia, diabetic polys,  or visual blurring. She reports FBG's usu run about 130-140. But patient does c/o intermittent paresthesias of the fingers and toes. Last A1c was  6.9% on 05/02/2014.   Further, the patient also has history of Vitamin D Deficiency and supplements vitamin D without any suspected side-effects. Last vitamin D was  63 on 05/02/2014.  Medication Sig  . acetaminophen  500 MG tablet Take 1,000 mg by mouth every 6 (six) hours as needed for moderate pain.  Marland Kitchen aspirin 81 MG tablet Take 81 mg by mouth daily.  Marland Kitchen atenolol  50 MG tablet Take 1 tablet (50 mg total) by mouth daily.  . Biotin 5000 MCG TABS Take 5,000 Units by mouth 2 (two) times daily.   . clobetasol cream (TEMOVATE) 0.05 %   . SLOW FE 160 (50 FE) MG TBCR Take 160 mg by mouth 2 (two) times daily.   . fish oil-omega-3 fatty acids 1000 MG capsule Take 2 g by mouth at bedtime.   Marland Kitchen FLAX SEED OIL 1000 MG CAPS Take 1  capsule by mouth 3 (three) times daily.  Marland Kitchen glimepiride (AMARYL) 4 MG tablet Take 1 tablet (4 mg total) by mouth daily with breakfast.  . lansoprazole (PREVACID) 30 MG capsule TAKE 1 BY MOUTH DAILY  . levETIRAcetam (KEPPRA) 500 MG tablet TAKE 1 BY MOUTH 3 TIMES DAILY AFTER MEALS  . levothyroxine 100 MCG tablet Take 1 tablet (100 mcg total) by mouth daily.  Marland Kitchen loratadine 10 MG tablet Take 10 mg by mouth daily.  Marland Kitchen MAG-OX 400 MG tablet Take 400 mg by mouth 3 (three) times daily.   . meclizine (ANTIVERT) 25 MG tablet Take 1 tablet (25 mg total) by mouth daily.  . metFORMIN -XR 500 MG  Take 1 tablet (500 mg total) by mouth 2 (two) times daily.  . saxagliptin  (ONGLYZA) 5 MG TABS Take 1 tablet daily as directed for Diabetes  . vitamin B-12 100 MCG tablet Take 50 mcg by mouth daily.  . vitamin C  500 MG tablet Take 500 mg by mouth daily.  . fenofibrate  134 MG cap Take 1 capsule (134 mg total) by mouth at bedtime.  . levETIRAcetam (KEPPRA) 500 MG tablet Take 1 tablet (500 mg total) by mouth 3 (three) times daily after meals.   Allergies  Allergen Reactions  . Ppd [Tuberculin Purified Protein Derivative] Other (See Comments)  indurated   PMHx:   Past Medical History  Diagnosis Date  . Anemia, unspecified   . Meniere's disease   . UTI (urinary tract infection)   . High cholesterol   . Other forms of epilepsy and recurrent seizures without mention of intractable epilepsy 11/04/2012    Non convulsive paroxysmal spells, responding to [facility], patient not driving.   Marland Kitchen GERD (gastroesophageal reflux disease)   . Vitamin D deficiency   . Gout   . Hypothyroidism   . Obesity (BMI 30-39.9)   . Hypertension   . Pneumonia ~ 1943  . Type II or unspecified type diabetes mellitus without mention of complication, not stated as uncontrolled   . DM neuropathy, type II diabetes mellitus   . Epilepsy   . Arthritis     "knees; right shoulder" (09/15/2013)  . Basal cell carcinoma     "right upper outer lip"   . Skin cancer     "forehead; right hand"   Immunization History  Administered Date(s) Administered  . DTaP 03/18/2007  . Influenza, High Dose Seasonal PF 10/18/2013  . Influenza-Unspecified 11/18/2012  . Pneumococcal Polysaccharide-23 09/16/2013  . Zoster 02/28/2005   Past Surgical History  Procedure Laterality Date  . Tonsillectomy  ~ 1947  . Wrist surgery Left ~ 1950    "ran arm thru window"  . Cervical polypectomy    . Hammer toe surgery Left 1980's  . Mohs surgery  20087    "right upper outer lip"   FHx:    Reviewed / unchanged  SHx:    Reviewed / unchanged  Systems Review:  Constitutional: Denies fever, chills, wt changes, headaches, insomnia, fatigue, night sweats, change in appetite. Eyes: Denies redness, blurred vision, diplopia, discharge, itchy, watery eyes.  ENT: Denies discharge, congestion, post nasal drip, epistaxis, sore throat, earache, hearing loss, dental pain, tinnitus, vertigo, sinus pain, snoring.  CV: Denies chest pain, palpitations, irregular heartbeat, syncope, dyspnea, diaphoresis, orthopnea, PND, claudication or edema. Respiratory: denies cough, dyspnea, DOE, pleurisy, hoarseness, laryngitis, wheezing.  Gastrointestinal: Denies dysphagia, odynophagia, heartburn, reflux, water brash, abdominal pain or cramps, nausea, vomiting, bloating, diarrhea, constipation, hematemesis, melena, hematochezia  or hemorrhoids. Genitourinary: Denies dysuria, frequency, urgency, nocturia, hesitancy, discharge, hematuria or flank pain. Musculoskeletal: Denies arthralgias, myalgias, stiffness, jt. swelling, pain, limping or strain/sprain.  Skin: Denies pruritus, rash, hives, warts, acne, eczema or change in skin lesion(s). Neuro: No weakness, tremor, incoordination, spasms, paresthesia or pain. Psychiatric: Denies confusion, memory loss or sensory loss. Endo: Denies change in weight, skin or hair change.  Heme/Lymph: No excessive bleeding, bruising or enlarged lymph  nodes.  Physical Exam  BP 122/74   Pulse 80  Temp 97.9 F   Resp 16  Ht 5\' 2"    Wt 215 lb 3.2 oz    BMI 39.35  Appears well nourished and in no distress. Eyes: PERRLA, EOMs, conjunctiva no swelling or erythema. Sinuses: No frontal/maxillary tenderness ENT/Mouth: EAC's clear, TM's nl w/o erythema, bulging. Nares clear w/o erythema, swelling, exudates. Oropharynx clear without erythema or exudates. Oral hygiene is good. Tongue normal, non obstructing. Hearing intact.  Neck: Supple. Thyroid nl. Car 2+/2+ without bruits, nodes or JVD. Chest: Respirations nl with BS clear & equal w/o rales, rhonchi, wheezing or stridor.  Cor: Heart sounds normal w/ regular rate and rhythm without sig. murmurs, gallops, clicks, or rubs. Peripheral pulses normal and equal  without edema.  Abdomen: Soft & bowel sounds normal. Non-tender w/o guarding, rebound, hernias, masses, or organomegaly.  Lymphatics: Unremarkable.  Musculoskeletal: Full ROM  all peripheral extremities, joint stability, 5/5 strength, and normal gait.  Skin: Warm, dry without exposed rashes, lesions or ecchymosis apparent.  Neuro: Cranial nerves intact, reflexes equal bilaterally. Sensory-motor testing grossly intact. Tendon reflexes grossly intact.  Pysch: Alert & oriented x 3.  Insight and judgement nl & appropriate. No ideations.  Assessment and Plan:  1. Essential hypertension  - TSH  2. Hyperlipidemia  - Lipid panel  3. T2_NIDDM with  CKD 4 (GFR 29 ml/min)  - Hemoglobin A1c - Insulin, random  4. Vitamin D deficiency  - Vit D  25 hydroxy   5. OSA on CPAP   6. Medication management  - CBC with Differential/Platelet - BASIC METABOLIC PANEL WITH GFR - Hepatic function panel - Magnesium  7. SAS   - Re-order CPAP   Recommended regular exercise, BP monitoring, weight control, and discussed med and SE's. Recommended labs to assess and monitor clinical status. Further disposition pending results of labs. Over 30  minutes of exam, counseling, chart review was performed

## 2014-08-09 LAB — INSULIN, RANDOM: Insulin: 46.4 u[IU]/mL — ABNORMAL HIGH (ref 2.0–19.6)

## 2014-08-09 LAB — VITAMIN D 25 HYDROXY (VIT D DEFICIENCY, FRACTURES): Vit D, 25-Hydroxy: 73 ng/mL (ref 30–100)

## 2014-08-14 ENCOUNTER — Encounter: Payer: Self-pay | Admitting: *Deleted

## 2014-10-03 ENCOUNTER — Encounter: Payer: Self-pay | Admitting: Internal Medicine

## 2014-10-03 ENCOUNTER — Other Ambulatory Visit: Payer: Self-pay | Admitting: Physician Assistant

## 2014-11-14 ENCOUNTER — Other Ambulatory Visit: Payer: Self-pay | Admitting: Internal Medicine

## 2014-11-14 ENCOUNTER — Ambulatory Visit (INDEPENDENT_AMBULATORY_CARE_PROVIDER_SITE_OTHER): Payer: Medicare Other | Admitting: Adult Health

## 2014-11-14 ENCOUNTER — Encounter: Payer: Self-pay | Admitting: Adult Health

## 2014-11-14 VITALS — BP 124/81 | HR 67 | Ht 62.0 in | Wt 218.0 lb

## 2014-11-14 DIAGNOSIS — R569 Unspecified convulsions: Secondary | ICD-10-CM

## 2014-11-14 NOTE — Progress Notes (Signed)
PATIENT: [name] [name] DOB: [birth year]  REASON FOR VISIT: follow up- seizures HISTORY FROM: patient  HISTORY OF PRESENT ILLNESS: [name] [name] is a 81 year old female with a history of seizures. She returns today for follow-up. The patient continues to take Keppra and tolerates it well. She denies any seizure events since the last visit. She does not operate a motor vehicle. She continues to live at the [place] home. She is able to complete all ADLs independently. She does have someone that helps her clean her home. Denies any changes with her mood or balance. She does state that she continues to have some episodes of dizziness. In the past she states she was diagnosed with vertigo and completed rehabilitation at the [facility] home. She also states that she was recently diagnosed with sleep apnea. She returns today for an evaluation.   HISTORY 11/08/13: Ms. [name] is a 81 year old female with a history of seizures. She returns today for follow-up. She is currently taking Keppra and tolerating it well. She reports that her last seizure was years ago. 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 She had a seizure while driving and decided to stop driving after that. She currently lives at the [place] home. She states that she has been dizzy this morning but relates that to her eyes. She has an appointment this week to get her eyes checked. Patient states that she was in he hospital in July for Sepsis, she was there for 4-5 days and then released. Otherwise the patient states that she has been doing well.   HISTORY 11/04/12 (CD): [name] [name] is meanwhile 81 years old she returns for repeat routine followup visit she has a history of possible seizures EEG has been normal in the past she also had a normal MRI of the brain in 2012. She presented first in 2008 with vertigo .  The patient was placed on Keppra , generic form , by her pharmacy. The next visit with our office we changed her to and brand-name only. The  patient has also a history of Mnire's disease. She lives at the [place] home.  The patient had an 2001 developed multiple spells of vertigo that were treated with Antivert. However it seems that at the vertigo lasted 2 minutes, was associated with LOC or awareness and therefore a possible seizure manifestation. Spells had always lasted 2-3 minutes , are not preceded by an aura. Her last manifestation of what is likely a seizure was in 2012.  She remains Keppra-side effects free, she has lost weight by controlling her portions, had neither falls nor progressing hearing loss. She is using hearing aids now. Her direct costs due to [facility] name prescription are 1800 USD per 90 days, and she cannot afford the [facility] prescription. I will refill generic form at 90 days to avoid Frequent changes between different generica.  She is followed by Dr. Humphrey Rolls for chronic Iron deficiency anemia, I was able to review her last labs from the cancer center, Dr. Humphrey Rolls. The patient doesn't exercise and her weight loss has been minimal over the last 15 month.  EDS - Epworth 6 points. FSS 32 , Naps occasionally , falls asleep reading the newspaper. She does not believe she has apnea, but was never tested.    REVIEW OF SYSTEMS: Out of a complete 14 system review of symptoms, the patient complains only of the following symptoms, and all other reviewed systems are negative.  See history of present illness  ALLERGIES: Allergies  Allergen Reactions  . Ppd [Tuberculin Purified Protein Derivative] Other (See Comments)    indurated    HOME MEDICATIONS: Outpatient Prescriptions Prior to Visit  Medication Sig Dispense Refill  . acetaminophen (TYLENOL) 500 MG tablet Take 1,000 mg by mouth every 6 (six) hours as needed for moderate pain.    Marland Kitchen aspirin 81 MG tablet Take 81 mg by mouth daily.    Marland Kitchen atenolol (TENORMIN) 50 MG tablet Take 1 tablet (50 mg total) by mouth daily. (Patient taking differently: Take 50 mg by mouth daily. Half  tablet daily.) 90 tablet 3  . Biotin 5000 MCG TABS Take 5,000 Units by mouth 2 (two) times daily.     . Cholecalciferol (VITAMIN D PO) Take 5,000 Units by mouth 2 (two) times daily.    . ferrous sulfate dried (SLOW FE) 160 (50 FE) MG TBCR Take 160 mg by mouth 2 (two) times daily.     . fish oil-omega-3 fatty acids 1000 MG capsule Take 2 g by mouth at bedtime.     . Flaxseed, Linseed, (FLAX SEED OIL) 1000 MG CAPS Take 1 capsule by mouth 3 (three) times daily.    Marland Kitchen FREESTYLE INSULINX TEST test strip TEST once daily FOR BLOOD SUGAR EACH MORNING. 100 each PRN  . Lancets (FREESTYLE) lancets TEST once daily EACH MORNING. 100 each PRN  . lansoprazole (PREVACID) 30 MG capsule TAKE 1 BY MOUTH DAILY 90 capsule 99  . levETIRAcetam (KEPPRA) 500 MG tablet TAKE 1 BY MOUTH 3 TIMES DAILY AFTER MEALS 270 tablet 1  . levothyroxine (SYNTHROID, LEVOTHROID) 100 MCG tablet Take 1 tablet (100 mcg total) by mouth daily. 90 tablet 3  . loratadine (CLARITIN) 10 MG tablet Take 10 mg by mouth daily.    . magnesium oxide (MAG-OX) 400 MG tablet Take 400 mg by mouth 3 (three) times daily.     . meclizine (ANTIVERT) 25 MG tablet Take 1 tablet (25 mg total) by mouth daily. 90 tablet 3  . metFORMIN (GLUCOPHAGE-XR) 500 MG 24 hr tablet Take 1 tablet (500 mg total) by mouth 2 (two) times daily. 180 tablet 3  . saxagliptin HCl (ONGLYZA) 5 MG TABS tablet Take 1 tablet daily as directed for Diabetes 90 tablet 99  . vitamin B-12 (CYANOCOBALAMIN) 100 MCG tablet Take 50 mcg by mouth daily.    . vitamin C (ASCORBIC ACID) 500 MG tablet Take 500 mg by mouth daily.    Marland Kitchen glimepiride (AMARYL) 4 MG tablet Take 1 tablet (4 mg total) by mouth daily with breakfast. 90 tablet 99  . clobetasol cream (TEMOVATE) 0.05 %     . FREESTYLE INSULINX TEST test strip   0  . Lancets (FREESTYLE) lancets   0   No facility-administered medications prior to visit.    PAST MEDICAL HISTORY: Past Medical History  Diagnosis Date  . Anemia, unspecified   .  Meniere's disease   . UTI (urinary tract infection)   . High cholesterol   . Other forms of epilepsy and recurrent seizures without mention of intractable epilepsy 11/04/2012    Non convulsive paroxysmal spells, responding to [facility], patient not driving.   Marland Kitchen GERD (gastroesophageal reflux disease)   . Vitamin D deficiency   . Gout   . Hypothyroidism   . Obesity (BMI 30-39.9)   . Hypertension   . Pneumonia ~ 1943  . Type II or unspecified type diabetes mellitus without mention of complication, not stated as uncontrolled   . DM neuropathy, type II diabetes mellitus   .  Epilepsy   . Arthritis     "knees; right shoulder" (09/15/2013)  . Basal cell carcinoma     "right upper outer lip"  . Skin cancer     "forehead; right hand"    PAST SURGICAL HISTORY: Past Surgical History  Procedure Laterality Date  . Tonsillectomy  ~ 1947  . Wrist surgery Left ~ 1950    "ran arm thru window"  . Cervical polypectomy    . Hammer toe surgery Left 1980's  . Mohs surgery  20087    "right upper outer lip"    FAMILY HISTORY: Family History  Problem Relation Age of Onset  . Heart disease Mother   . Stroke Mother   . Heart disease Father   . Ovarian cancer Sister   . Hypertension Son   . Hypertension Son   . Diabetes Son   . Alcohol abuse Son     SOCIAL HISTORY: Social History   Social History  . Marital Status: Widowed    Spouse Name: N/A  . Number of Children: 3  . Years of Education: 13   Occupational History  . retired    Social History Main Topics  . Smoking status: Former Smoker -- 1.00 packs/day for 30 years    Types: Cigarettes    Quit date: 06/29/1983  . Smokeless tobacco: Never Used  . Alcohol Use: 0.0 oz/week    0 Standard drinks or equivalent per week     Comment: 09/15/2013 "glass of wine maybe once/year"  . Drug Use: No  . Sexual Activity: Not Currently   Other Topics Concern  . Not on file   Social History Narrative   Patient lives at home alone and she is  widowed.   Retired.   Education some college.   Right handed.   Caffeine sometimes not daily.       PHYSICAL EXAM  Filed Vitals:   11/14/14 0825  BP: 124/81  Pulse: 67  Height: 5\' 2"  (1.575 m)  Weight: 218 lb (98.884 kg)   Body mass index is 39.86 kg/(m^2).  Generalized: Well developed, in no acute distress   Neurological examination  Mentation: Alert oriented to time, place, history taking. Follows all commands speech and language fluent Cranial nerve II-XII:  Extraocular movements were full, visual field were full on confrontational test. Facial sensation and strength were normal. Uvula tongue midline. Head turning and shoulder shrug  were normal and symmetric. Motor: The motor testing reveals 5 over 5 strength of all 4 extremities. Good symmetric motor tone is noted throughout.  Sensory: Sensory testing is intact to soft touch on all 4 extremities. No evidence of extinction is noted.  Coordination: Cerebellar testing reveals good finger-nose-finger and heel-to-shin bilaterally.  Gait and station: Gait is normal. Tandem gait is slightly unsteady. Romberg is negative. No drift is seen.  Reflexes: Deep tendon reflexes are symmetric and normal bilaterally.   DIAGNOSTIC DATA (LABS, IMAGING, TESTING) - I reviewed patient records, labs, notes, testing and imaging myself where available.  Lab Results  Component Value Date   WBC 5.3 08/08/2014   HGB 12.6 08/08/2014   HCT 39.3 08/08/2014   MCV 89.7 08/08/2014   PLT 256 08/08/2014      Component Value Date/Time   NA 143 08/08/2014 1200   K 4.6 08/08/2014 1200   CL 104 08/08/2014 1200   CO2 25 08/08/2014 1200   GLUCOSE 129* 08/08/2014 1200   BUN 24* 08/08/2014 1200   CREATININE 1.56* 08/08/2014 1200   CREATININE 1.66*  09/20/2013 0630   CALCIUM 10.5 08/08/2014 1200   PROT 6.5 08/08/2014 1200   ALBUMIN 4.4 08/08/2014 1200   AST 21 08/08/2014 1200   ALT 35 08/08/2014 1200   ALKPHOS 41 08/08/2014 1200   BILITOT 0.4  08/08/2014 1200   GFRNONAA 31* 08/08/2014 1200   GFRNONAA 28* 09/20/2013 0630   GFRAA 36* 08/08/2014 1200   GFRAA 33* 09/20/2013 0630   Lab Results  Component Value Date   CHOL 166 08/08/2014   HDL 25* 08/08/2014   LDLCALC 92 08/08/2014   TRIG 245* 08/08/2014   CHOLHDL 6.6 08/08/2014    ASSESSMENT AND PLAN 81 y.o. year old female  has a past medical history of Anemia, unspecified; Meniere's disease; UTI (urinary tract infection); High cholesterol; Other forms of epilepsy and recurrent seizures without mention of intractable epilepsy (11/04/2012); GERD (gastroesophageal reflux disease); Vitamin D deficiency; Gout; Hypothyroidism; Obesity (BMI 30-39.9); Hypertension; Pneumonia (~ 1943); Type II or unspecified type diabetes mellitus without mention of complication, not stated as uncontrolled; DM neuropathy, type II diabetes mellitus; Epilepsy; Arthritis; Basal cell carcinoma; and Skin cancer. here with:  1. Seizures  Overall the patient has done well. She will continue on Keppra for seizure prevention. The patient does state that her pill color changed. She was informed by the company that the manufactured had change. In the past she has tried taking brand name Keppra but she states she could not afford it. Patient warned that sometimes changing the pill manufacture can cause a seizure. Patient verbalized understanding. If she has any seizure events she will let us know. She will follow-up in one year with Dr. Brett Fairy.  Ward Givens, MSN, NP-C 11/14/2014, 8:34 AM [facility] [address] [place], [place] [address] [phone]

## 2014-11-14 NOTE — Patient Instructions (Signed)
Continue Keppra When the manufacturer changes (pill color changes) then it could cause a seizure.  If you have any seizure events please let us know.

## 2014-11-14 NOTE — Progress Notes (Signed)
I agree with the assessment and plan as directed by NP .The patient is known to me .   DOHMEIER,CARMEN, MD  

## 2014-12-12 ENCOUNTER — Encounter: Payer: Self-pay | Admitting: Internal Medicine

## 2014-12-12 ENCOUNTER — Other Ambulatory Visit: Payer: Self-pay | Admitting: Internal Medicine

## 2014-12-12 ENCOUNTER — Ambulatory Visit (INDEPENDENT_AMBULATORY_CARE_PROVIDER_SITE_OTHER): Payer: Medicare Other | Admitting: Internal Medicine

## 2014-12-12 VITALS — BP 122/68 | HR 88 | Temp 97.5°F | Resp 16 | Ht 61.75 in | Wt 216.4 lb

## 2014-12-12 DIAGNOSIS — Z1331 Encounter for screening for depression: Secondary | ICD-10-CM

## 2014-12-12 DIAGNOSIS — E1149 Type 2 diabetes mellitus with other diabetic neurological complication: Secondary | ICD-10-CM

## 2014-12-12 DIAGNOSIS — Z9989 Dependence on other enabling machines and devices: Secondary | ICD-10-CM

## 2014-12-12 DIAGNOSIS — Z9181 History of falling: Secondary | ICD-10-CM

## 2014-12-12 DIAGNOSIS — Z789 Other specified health status: Secondary | ICD-10-CM

## 2014-12-12 DIAGNOSIS — E782 Mixed hyperlipidemia: Secondary | ICD-10-CM

## 2014-12-12 DIAGNOSIS — Z23 Encounter for immunization: Secondary | ICD-10-CM | POA: Diagnosis not present

## 2014-12-12 DIAGNOSIS — Z0001 Encounter for general adult medical examination with abnormal findings: Secondary | ICD-10-CM

## 2014-12-12 DIAGNOSIS — D509 Iron deficiency anemia, unspecified: Secondary | ICD-10-CM | POA: Diagnosis not present

## 2014-12-12 DIAGNOSIS — Z79899 Other long term (current) drug therapy: Secondary | ICD-10-CM

## 2014-12-12 DIAGNOSIS — G4733 Obstructive sleep apnea (adult) (pediatric): Secondary | ICD-10-CM

## 2014-12-12 DIAGNOSIS — R6889 Other general symptoms and signs: Secondary | ICD-10-CM | POA: Diagnosis not present

## 2014-12-12 DIAGNOSIS — E1121 Type 2 diabetes mellitus with diabetic nephropathy: Secondary | ICD-10-CM

## 2014-12-12 DIAGNOSIS — E039 Hypothyroidism, unspecified: Secondary | ICD-10-CM

## 2014-12-12 DIAGNOSIS — Z1389 Encounter for screening for other disorder: Secondary | ICD-10-CM | POA: Diagnosis not present

## 2014-12-12 DIAGNOSIS — K219 Gastro-esophageal reflux disease without esophagitis: Secondary | ICD-10-CM

## 2014-12-12 DIAGNOSIS — I10 Essential (primary) hypertension: Secondary | ICD-10-CM

## 2014-12-12 DIAGNOSIS — Z1212 Encounter for screening for malignant neoplasm of rectum: Secondary | ICD-10-CM

## 2014-12-12 DIAGNOSIS — Z6839 Body mass index (BMI) 39.0-39.9, adult: Secondary | ICD-10-CM

## 2014-12-12 DIAGNOSIS — E559 Vitamin D deficiency, unspecified: Secondary | ICD-10-CM

## 2014-12-12 DIAGNOSIS — G40909 Epilepsy, unspecified, not intractable, without status epilepticus: Secondary | ICD-10-CM

## 2014-12-12 LAB — CBC WITH DIFFERENTIAL/PLATELET
Basophils Absolute: 0 10*3/uL (ref 0.0–0.1)
Basophils Relative: 0 % (ref 0–1)
Eosinophils Absolute: 0.6 10*3/uL (ref 0.0–0.7)
Eosinophils Relative: 7 % — ABNORMAL HIGH (ref 0–5)
HCT: 40.3 % (ref 36.0–46.0)
Hemoglobin: 13.2 g/dL (ref 12.0–15.0)
Lymphocytes Relative: 19 % (ref 12–46)
Lymphs Abs: 1.6 10*3/uL (ref 0.7–4.0)
MCH: 29.6 pg (ref 26.0–34.0)
MCHC: 32.8 g/dL (ref 30.0–36.0)
MCV: 90.4 fL (ref 78.0–100.0)
MPV: 10.9 fL (ref 8.6–12.4)
Monocytes Absolute: 0.7 10*3/uL (ref 0.1–1.0)
Monocytes Relative: 8 % (ref 3–12)
Neutro Abs: 5.7 10*3/uL (ref 1.7–7.7)
Neutrophils Relative %: 66 % (ref 43–77)
Platelets: 270 10*3/uL (ref 150–400)
RBC: 4.46 MIL/uL (ref 3.87–5.11)
RDW: 15.5 % (ref 11.5–15.5)
WBC: 8.6 10*3/uL (ref 4.0–10.5)

## 2014-12-12 LAB — TSH: TSH: 1.638 u[IU]/mL (ref 0.350–4.500)

## 2014-12-12 NOTE — Progress Notes (Signed)
Patient ID: [name] [name], female   DOB: [birth year], 81 y.o.   MRN: [id number]    Annual Medicare  Preventative Visit & Comprehensive Evaluation, Examination & Management       This very nice 81 y.o. [name] [name] presents for  presents for an Annual Medicare Preventative Visit & comprehensive evaluation and management of multiple medical co-morbidities.  Patient has been followed for HTN, T2_NIDDM, Hyperlipidemia and Vitamin D Deficiency. She is also followed by Dr [name] [name] for atypical Seizure Disorder with fugue states or automatisms.       HTN predates since 1999. Patient's BP has been controlled at home and patient denies any cardiac symptoms as chest pain, palpitations, shortness of breath, dizziness or ankle swelling. Today's BP: 122/68 mmHg      Patient's hyperlipidemia is controlled with diet and medications. Patient denies myalgias or other medication SE's. Last lipids were at goal with Cholesterol 166; HDL 25*; LDL 92; but with elevated Triglycerides 245 on 08/08/2014.     Patient has Morbid Obesity with BMI 39+ and is non-dietary compliant and has consequent T2_NIDDM w/ Nephropathy (Stage 4  W/ GFR 29 ml/min)  and peripheral sensory Neuropathy predating since 2001 and patient denies reactive hypoglycemic symptoms, visual blurring, diabetic polys,but does have tingling numb paresthesias of her feet/toes. She reports FBG's usu range about 130-150's.  Last A1c was 6.6% on 08/08/2014.   Finally, patient has history of Vitamin D Deficiency of 37 in 2008 and last Vitamin D was 73 on 08/08/2014.      Medication Sig  . acetaminophen  500 MG tablet Take 1,000 mg by mouth every 6 (six) hours as needed for moderate pain.  Marland Kitchen aspirin 81 MG tablet Take 81 mg by mouth daily.  . Atenolol  50 MG tablet Take 1 tablet (50 mg total) by mouth daily. (Patient taking differently: Take 50 mg by mouth daily. Half tablet daily.)  . Biotin 5000 MCG TABS Take 5,000 Units by mouth 2 (two) times daily.   Marland Kitchen VITAMIN D  Take  5,000 Units by mouth 2 (two) times daily.  Marland Kitchen SLOW FE 160 (50 FE) MG  Take 160 mg by mouth 2 (two) times daily.   . fish oil  1000 MG capsule Take 2 g by mouth at bedtime.   Marland Kitchen FLAX SEED OIL 1000 MG  Take 1 capsule by mouth 3 (three) times daily.  Marland Kitchen glimepiride  4 MG tablet take 1 tablet by mouth once daily WITH BREAKFAST  . lansoprazole  30 MG capsule TAKE 1 BY MOUTH DAILY  . levETIRAcetam (KEPPRA) 500 MG TAKE 1 BY MOUTH 3 TIMES DAILY AFTER MEALS  . levothyroxine 100 MCG tablet Take 1 tablet (100 mcg total) by mouth daily.  Marland Kitchen loratadine  10 MG tablet Take 10 mg by mouth daily.  . magnesium oxide  400 MG tablet Take 400 mg by mouth 3 (three) times daily.   . meclizine  25 MG tablet Take 1 tablet (25 mg total) by mouth daily.  . metFORMIN -XR 500 MG 24 hr tablet Take 1 tablet (500 mg total) by mouth 2 (two) times daily.  . saxagliptin  (ONGLYZA) 5 MG TABS tablet Take 1 tablet daily as directed for Diabetes  . vitamin B-12  100 MCG tablet Take 50 mcg by mouth daily.  . vitamin C  500 MG tablet Take 500 mg by mouth daily.   Allergies  Allergen Reactions  . Ppd [Tuberculin Purified Protein Derivative] Other (See Comments)    indurated  Past Medical History  Diagnosis Date  . Anemia, unspecified   . Meniere's disease   . UTI (urinary tract infection)   . High cholesterol   . Other forms of epilepsy and recurrent seizures without mention of intractable epilepsy 11/04/2012    Non convulsive paroxysmal spells, responding to [name], patient not driving.   Marland Kitchen GERD (gastroesophageal reflux disease)   . Vitamin D deficiency   . Gout   . Hypothyroidism   . Obesity (BMI 30-39.9)   . Hypertension   . Pneumonia ~ 1943  . Type II or unspecified type diabetes mellitus without mention of complication, not stated as uncontrolled   . DM neuropathy, type II diabetes mellitus ([place])   . Epilepsy ([place])   . Arthritis     "knees; right shoulder" (09/15/2013)  . Basal cell carcinoma     "right upper outer  lip"  . Skin cancer     "forehead; right hand"   Health Maintenance  Topic Date Due  . OPHTHALMOLOGY EXAM  12/10/1944  . TETANUS/TDAP  12/10/1953  . DEXA SCAN  12/11/1999  . URINE MICROALBUMIN  06/29/2014  . PNA vac Low Risk Adult (2 of 2 - PCV13) 09/17/2014  . INFLUENZA VACCINE  09/19/2014  . FOOT EXAM  10/19/2014  . HEMOGLOBIN A1C  02/07/2015  . ZOSTAVAX  Completed   Immunization History  Administered Date(s) Administered  . DTaP 03/18/2007  . Influenza, High Dose Seasonal PF 10/18/2013  . Influenza-Unspecified 11/18/2012  . Pneumococcal Polysaccharide-23 09/16/2013  . Zoster 02/28/2005   Past Surgical History  Procedure Laterality Date  . Tonsillectomy  ~ 1947  . Wrist surgery Left ~ 1950    "ran arm thru window"  . Cervical polypectomy    . Hammer toe surgery Left 1980's  . Mohs surgery  20087    "right upper outer lip"   Family History  Problem Relation Age of Onset  . Heart disease Mother   . Stroke Mother   . Heart disease Father   . Ovarian cancer Sister   . Hypertension Son   . Hypertension Son   . Diabetes Son   . Alcohol abuse Son    Social History  Substance Use Topics  . Smoking status: Former Smoker -- 1.00 packs/day for 30 years    Types: Cigarettes    Quit date: 06/29/1983  . Smokeless tobacco: Never Used  . Alcohol Use: 0.0 oz/week    0 Standard drinks or equivalent per week     Comment: 09/15/2013 "glass of wine maybe once/year"    ROS Constitutional: Denies fever, chills, weight loss/gain, headaches, insomnia,  night sweats, and change in appetite. Does c/o fatigue. Eyes: Denies redness, blurred vision, diplopia, discharge, itchy, watery eyes.  ENT: Denies discharge, congestion, post nasal drip, epistaxis, sore throat, earache, hearing loss, dental pain, Tinnitus, Vertigo, Sinus pain, snoring.  Cardio: Denies chest pain, palpitations, irregular heartbeat, syncope, dyspnea, diaphoresis, orthopnea, PND, claudication, edema Respiratory:  denies cough, dyspnea, DOE, pleurisy, hoarseness, laryngitis, wheezing.  Gastrointestinal: Denies dysphagia, heartburn, reflux, water brash, pain, cramps, nausea, vomiting, bloating, diarrhea, constipation, hematemesis, melena, hematochezia, jaundice, hemorrhoids Genitourinary: Denies dysuria, frequency, urgency, nocturia, hesitancy, discharge, hematuria, flank pain Breast: Breast lumps, nipple discharge, bleeding.  Musculoskeletal: Denies arthralgia, myalgia, stiffness, Jt. Swelling, pain, limp, and strain/sprain. Denies falls. Skin: Denies puritis, rash, hives, warts, acne, eczema, changing in skin lesion Neuro: No weakness, tremor, incoordination, spasms, paresthesia, pain Psychiatric: Denies confusion, memory loss, sensory loss. Denies Depression. Endocrine: Denies change in weight, skin, hair  change, nocturia, and paresthesia, diabetic polys, visual blurring, hyper / hypo glycemic episodes.  Heme/Lymph: No excessive bleeding, bruising, enlarged lymph nodes.  Physical Exam  BP 122/68 mmHg  Pulse 88  Temp(Src) 97.5 F (36.4 C)  Resp 16  Ht 5' 1.75" (1.568 m)  Wt 216 lb 6.4 oz (98.158 kg)  BMI 39.92 kg/m2  General Appearance: Well nourished and in no apparent distress. Eyes: PERRLA, EOMs, conjunctiva no swelling or erythema, normal fundi and vessels. Sinuses: No frontal/maxillary tenderness ENT/Mouth: EACs patent / TMs  nl. Nares clear without erythema, swelling, mucoid exudates. Oral hygiene is good. No erythema, swelling, or exudate. Tongue normal, non-obstructing. Tonsils not swollen or erythematous. Hearing normal.  Neck: Supple, thyroid normal. No bruits, nodes or JVD. Respiratory: Respiratory effort normal.  BS equal and clear bilateral without rales, rhonci, wheezing or stridor. Cardio: Heart sounds are normal with regular rate and rhythm and no murmurs, rubs or gallops. Peripheral pulses are normal and equal bilaterally without edema. No aortic or femoral bruits. Chest:  symmetric with normal excursions and percussion. Breasts: Symmetric, without lumps, nipple discharge, retractions, or fibrocystic changes.  Abdomen: Flat, soft, with bowel sounds. Nontender, no guarding, rebound, hernias, masses, or organomegaly.  Lymphatics: Non tender without lymphadenopathy.  Musculoskeletal: Full ROM all peripheral extremities, joint stability, 5/5 strength, and normal gait. Skin: Warm and dry without rashes, lesions, cyanosis, clubbing or  ecchymosis.  Neuro: Cranial nerves intact, reflexes equal bilaterally. Normal muscle tone, no cerebellar symptoms. Sensation sl decreased in a stocking distribution bilaterally by monofilament and vibratory testing at the toes.  Pysch: Alert and oriented X 3, normal affect, Insight and Judgment appropriate.   Assessment and Plan  1. Encounter for general adult medical examination with abnormal findings  - Microalbumin / creatinine urine ratio - EKG 12-Lead - [place], RETROPERITNL ABD,  LTD - POC Hemoccult Bld/Stl - Iron and TIBC - HM DIABETES FOOT EXAM - LOW EXTREMITY NEUR EXAM DOCUM - Urinalysis, Routine w reflex microscopic  - CBC with Differential/Platelet - BASIC METABOLIC PANEL WITH GFR - Hepatic function panel - Magnesium - Lipid panel - TSH - Hemoglobin A1c - Insulin, random - Vit D  25 hydroxy  - Levetiracetam level  2. Essential hypertension  - EKG 12-Lead - [place], RETROPERITNL ABD,  LTD - TSH  3. Hyperlipidemia  - Lipid panel  4. T2_NIDDM w/Stage 4 CKD   ([place])  - Microalbumin / creatinine urine ratio - Hemoglobin A1c - Insulin, random  5. Vitamin D deficiency  - Vit D  25 hydroxy   6. Hypothyroidism  - TSH  7. Gastroesophageal reflux disease   8. T2_NIDDM w/Peripheral sensory Neuropathy  (H[facility])  - HM DIABETES FOOT EXAM - LOW EXTREMITY NEUR EXAM DOCUM - Hemoglobin A1c - Insulin, random  9. Seizure disorder (H[facility])  - Levetiracetam level  10. Iron deficiency anemia  - Iron and TIBC  11.  OSA on CPAP   12. Screening for rectal cancer  - POC Hemoccult Bld/Stl   13. Depression screen   14. BMI 39.86,   adult   15. Morbid obesity  ([place])   16. At low risk for fall   17. Medication management  - Urinalysis, Routine w reflex microscopic  - CBC with Differential/Platelet - BASIC METABOLIC PANEL WITH GFR - Hepatic function panel - Magnesium - Levetiracetam level   Continue prudent diet as discussed, weight control, BP monitoring, regular exercise, and medications. Discussed med's effects and SE's. Screening labs and tests as requested with regular follow-up as  recommended.  Over 40 minutes of exam, counseling, chart review was performed.

## 2014-12-12 NOTE — Patient Instructions (Signed)

## 2014-12-13 LAB — HEPATIC FUNCTION PANEL
ALT: 26 U/L (ref 6–29)
AST: 18 U/L (ref 10–35)
Albumin: 4.3 g/dL (ref 3.6–5.1)
Alkaline Phosphatase: 44 U/L (ref 33–130)
Bilirubin, Direct: 0.1 mg/dL (ref <=0.2)
Indirect Bilirubin: 0.3 mg/dL (ref 0.2–1.2)
Total Bilirubin: 0.4 mg/dL (ref 0.2–1.2)
Total Protein: 6.8 g/dL (ref 6.1–8.1)

## 2014-12-13 LAB — HEMOGLOBIN A1C
Hgb A1c MFr Bld: 6.7 % — ABNORMAL HIGH (ref <5.7)
Mean Plasma Glucose: 146 mg/dL — ABNORMAL HIGH (ref <117)

## 2014-12-13 LAB — URINALYSIS, ROUTINE W REFLEX MICROSCOPIC
Bilirubin Urine: NEGATIVE
Glucose, UA: NEGATIVE
Hgb urine dipstick: NEGATIVE
Ketones, ur: NEGATIVE
Nitrite: NEGATIVE
Protein, ur: NEGATIVE
Specific Gravity, Urine: 1.013 (ref 1.001–1.035)
pH: 6 (ref 5.0–8.0)

## 2014-12-13 LAB — LIPID PANEL
Cholesterol: 156 mg/dL (ref 125–200)
HDL: 23 mg/dL — ABNORMAL LOW (ref >=46)
LDL Cholesterol: 68 mg/dL (ref <130)
Total CHOL/HDL Ratio: 6.8 Ratio — ABNORMAL HIGH (ref <=5.0)
Triglycerides: 326 mg/dL — ABNORMAL HIGH (ref <150)
VLDL: 65 mg/dL — ABNORMAL HIGH (ref <30)

## 2014-12-13 LAB — BASIC METABOLIC PANEL WITH GFR
BUN: 35 mg/dL — ABNORMAL HIGH (ref 7–25)
CO2: 23 mmol/L (ref 20–31)
Calcium: 11.2 mg/dL — ABNORMAL HIGH (ref 8.6–10.4)
Chloride: 102 mmol/L (ref 98–110)
Creat: 1.71 mg/dL — ABNORMAL HIGH (ref 0.60–0.88)
GFR, Est African American: 32 mL/min — ABNORMAL LOW (ref >=60)
GFR, Est Non African American: 28 mL/min — ABNORMAL LOW (ref >=60)
Glucose, Bld: 124 mg/dL — ABNORMAL HIGH (ref 65–99)
Potassium: 4.3 mmol/L (ref 3.5–5.3)
Sodium: 137 mmol/L (ref 135–146)

## 2014-12-13 LAB — URINALYSIS, MICROSCOPIC ONLY
Bacteria, UA: NONE SEEN [HPF]
Casts: NONE SEEN [LPF]
Crystals: NONE SEEN [HPF]
Squamous Epithelial / LPF: NONE SEEN [HPF] (ref <=5)
WBC, UA: >60 WBC/HPF — AB (ref <=5)
Yeast: NONE SEEN [HPF]

## 2014-12-13 LAB — INSULIN, RANDOM: Insulin: 50.3 u[IU]/mL — ABNORMAL HIGH (ref 2.0–19.6)

## 2014-12-13 LAB — IRON AND TIBC
%SAT: 26 % (ref 11–50)
Iron: 77 ug/dL (ref 45–160)
TIBC: 291 ug/dL (ref 250–450)
UIBC: 214 ug/dL (ref 125–400)

## 2014-12-13 LAB — MAGNESIUM: Magnesium: 2.3 mg/dL (ref 1.5–2.5)

## 2014-12-13 LAB — MICROALBUMIN / CREATININE URINE RATIO
Creatinine, Urine: 71 mg/dL (ref 20–320)
Microalb Creat Ratio: 75 mcg/mg creat — ABNORMAL HIGH (ref <30)
Microalb, Ur: 5.3 mg/dL

## 2014-12-13 LAB — VITAMIN D 25 HYDROXY (VIT D DEFICIENCY, FRACTURES): Vit D, 25-Hydroxy: 97 ng/mL (ref 30–100)

## 2014-12-13 NOTE — Addendum Note (Signed)
Addended by: Melbourne Abts C on: 12/13/2014 08:45 AM   Modules accepted: Orders

## 2014-12-14 LAB — URINE CULTURE

## 2014-12-15 LAB — LEVETIRACETAM LEVEL: Keppra (Levetiracetam): 38.9 ug/mL

## 2014-12-23 ENCOUNTER — Other Ambulatory Visit: Payer: Self-pay

## 2014-12-23 MED ORDER — ATENOLOL 50 MG PO TABS: 50.0000 mg | ORAL_TABLET | Freq: Every day | ORAL | Status: DC

## 2015-01-02 ENCOUNTER — Other Ambulatory Visit: Payer: Self-pay | Admitting: *Deleted

## 2015-01-02 DIAGNOSIS — Z0001 Encounter for general adult medical examination with abnormal findings: Secondary | ICD-10-CM

## 2015-01-02 DIAGNOSIS — Z1212 Encounter for screening for malignant neoplasm of rectum: Secondary | ICD-10-CM

## 2015-01-02 LAB — POC HEMOCCULT BLD/STL (HOME/3-CARD/SCREEN)
Card #2 Fecal Occult Blod, POC: NEGATIVE
Card #3 Fecal Occult Blood, POC: NEGATIVE
Fecal Occult Blood, POC: NEGATIVE

## 2015-01-26 ENCOUNTER — Other Ambulatory Visit: Payer: Self-pay

## 2015-01-26 MED ORDER — SAXAGLIPTIN HCL 5 MG PO TABS: ORAL_TABLET | ORAL | Status: DC

## 2015-01-26 MED ORDER — METFORMIN HCL ER 500 MG PO TB24: 500.0000 mg | ORAL_TABLET | Freq: Two times a day (BID) | ORAL | Status: DC

## 2015-02-06 ENCOUNTER — Other Ambulatory Visit: Payer: Self-pay | Admitting: *Deleted

## 2015-02-06 DIAGNOSIS — E039 Hypothyroidism, unspecified: Secondary | ICD-10-CM

## 2015-02-06 MED ORDER — GLIMEPIRIDE 4 MG PO TABS: ORAL_TABLET | ORAL | Status: DC

## 2015-02-06 MED ORDER — LEVOTHYROXINE SODIUM 100 MCG PO TABS: 100.0000 ug | ORAL_TABLET | Freq: Every day | ORAL | Status: DC

## 2015-02-09 ENCOUNTER — Other Ambulatory Visit: Payer: Self-pay | Admitting: *Deleted

## 2015-02-09 MED ORDER — LEVETIRACETAM 500 MG PO TABS: ORAL_TABLET | ORAL | Status: DC

## 2015-02-21 ENCOUNTER — Other Ambulatory Visit: Payer: Self-pay | Admitting: *Deleted

## 2015-02-21 MED ORDER — LEVETIRACETAM 500 MG PO TABS: ORAL_TABLET | ORAL | Status: DC

## 2015-03-06 ENCOUNTER — Other Ambulatory Visit: Payer: Self-pay

## 2015-03-06 DIAGNOSIS — Z1231 Encounter for screening mammogram for malignant neoplasm of breast: Secondary | ICD-10-CM

## 2015-03-11 ENCOUNTER — Encounter: Payer: Self-pay | Admitting: *Deleted

## 2015-03-27 ENCOUNTER — Ambulatory Visit
Admission: RE | Admit: 2015-03-27 | Discharge: 2015-03-27 | Disposition: A | Discharge status: Home / Self Care | Payer: Medicare Other | Source: Ambulatory Visit

## 2015-03-27 DIAGNOSIS — Z1231 Encounter for screening mammogram for malignant neoplasm of breast: Secondary | ICD-10-CM

## 2015-03-29 ENCOUNTER — Other Ambulatory Visit: Payer: Self-pay | Admitting: *Deleted

## 2015-03-29 MED ORDER — LANSOPRAZOLE 30 MG PO CPDR: DELAYED_RELEASE_CAPSULE | ORAL | Status: DC

## 2015-04-12 ENCOUNTER — Other Ambulatory Visit: Payer: Self-pay | Admitting: *Deleted

## 2015-04-12 MED ORDER — MECLIZINE HCL 25 MG PO TABS: 25.0000 mg | ORAL_TABLET | Freq: Every day | ORAL | Status: DC

## 2015-04-24 ENCOUNTER — Ambulatory Visit: Payer: Self-pay | Admitting: Internal Medicine

## 2015-05-01 ENCOUNTER — Ambulatory Visit (INDEPENDENT_AMBULATORY_CARE_PROVIDER_SITE_OTHER): Payer: Medicare Other | Admitting: Internal Medicine

## 2015-05-01 ENCOUNTER — Encounter: Payer: Self-pay | Admitting: Internal Medicine

## 2015-05-01 VITALS — BP 126/70 | HR 78 | Temp 98.2°F | Resp 16 | Ht 61.75 in | Wt 218.0 lb

## 2015-05-01 DIAGNOSIS — D126 Benign neoplasm of colon, unspecified: Secondary | ICD-10-CM | POA: Diagnosis not present

## 2015-05-01 DIAGNOSIS — G4733 Obstructive sleep apnea (adult) (pediatric): Secondary | ICD-10-CM

## 2015-05-01 DIAGNOSIS — G40909 Epilepsy, unspecified, not intractable, without status epilepticus: Secondary | ICD-10-CM

## 2015-05-01 DIAGNOSIS — E039 Hypothyroidism, unspecified: Secondary | ICD-10-CM | POA: Diagnosis not present

## 2015-05-01 DIAGNOSIS — K219 Gastro-esophageal reflux disease without esophagitis: Secondary | ICD-10-CM | POA: Diagnosis not present

## 2015-05-01 DIAGNOSIS — Z6839 Body mass index (BMI) 39.0-39.9, adult: Secondary | ICD-10-CM

## 2015-05-01 DIAGNOSIS — E1121 Type 2 diabetes mellitus with diabetic nephropathy: Secondary | ICD-10-CM

## 2015-05-01 DIAGNOSIS — Z79899 Other long term (current) drug therapy: Secondary | ICD-10-CM | POA: Diagnosis not present

## 2015-05-01 DIAGNOSIS — E559 Vitamin D deficiency, unspecified: Secondary | ICD-10-CM | POA: Diagnosis not present

## 2015-05-01 DIAGNOSIS — I872 Venous insufficiency (chronic) (peripheral): Secondary | ICD-10-CM

## 2015-05-01 DIAGNOSIS — R6889 Other general symptoms and signs: Secondary | ICD-10-CM | POA: Diagnosis not present

## 2015-05-01 DIAGNOSIS — Z0000 Encounter for general adult medical examination without abnormal findings: Secondary | ICD-10-CM

## 2015-05-01 DIAGNOSIS — Z0001 Encounter for general adult medical examination with abnormal findings: Secondary | ICD-10-CM | POA: Diagnosis not present

## 2015-05-01 DIAGNOSIS — D509 Iron deficiency anemia, unspecified: Secondary | ICD-10-CM

## 2015-05-01 DIAGNOSIS — I10 Essential (primary) hypertension: Secondary | ICD-10-CM | POA: Diagnosis not present

## 2015-05-01 DIAGNOSIS — E782 Mixed hyperlipidemia: Secondary | ICD-10-CM | POA: Diagnosis not present

## 2015-05-01 DIAGNOSIS — E1149 Type 2 diabetes mellitus with other diabetic neurological complication: Secondary | ICD-10-CM | POA: Diagnosis not present

## 2015-05-01 DIAGNOSIS — Z9989 Dependence on other enabling machines and devices: Secondary | ICD-10-CM

## 2015-05-01 DIAGNOSIS — Z780 Asymptomatic menopausal state: Secondary | ICD-10-CM

## 2015-05-01 LAB — BASIC METABOLIC PANEL WITH GFR
BUN: 28 mg/dL — ABNORMAL HIGH (ref 7–25)
CO2: 26 mmol/L (ref 20–31)
Calcium: 11.3 mg/dL — ABNORMAL HIGH (ref 8.6–10.4)
Chloride: 104 mmol/L (ref 98–110)
Creat: 1.53 mg/dL — ABNORMAL HIGH (ref 0.60–0.88)
GFR, Est African American: 37 mL/min — ABNORMAL LOW (ref >=60)
GFR, Est Non African American: 32 mL/min — ABNORMAL LOW (ref >=60)
Glucose, Bld: 104 mg/dL — ABNORMAL HIGH (ref 65–99)
Potassium: 4.6 mmol/L (ref 3.5–5.3)
Sodium: 140 mmol/L (ref 135–146)

## 2015-05-01 LAB — CBC WITH DIFFERENTIAL/PLATELET
Basophils Absolute: 0 10*3/uL (ref 0.0–0.1)
Basophils Relative: 0 % (ref 0–1)
Eosinophils Absolute: 0.4 10*3/uL (ref 0.0–0.7)
Eosinophils Relative: 8 % — ABNORMAL HIGH (ref 0–5)
HCT: 40.3 % (ref 36.0–46.0)
Hemoglobin: 13.2 g/dL (ref 12.0–15.0)
Lymphocytes Relative: 22 % (ref 12–46)
Lymphs Abs: 1.2 10*3/uL (ref 0.7–4.0)
MCH: 29.4 pg (ref 26.0–34.0)
MCHC: 32.8 g/dL (ref 30.0–36.0)
MCV: 89.8 fL (ref 78.0–100.0)
MPV: 10.7 fL (ref 8.6–12.4)
Monocytes Absolute: 0.5 10*3/uL (ref 0.1–1.0)
Monocytes Relative: 9 % (ref 3–12)
Neutro Abs: 3.4 10*3/uL (ref 1.7–7.7)
Neutrophils Relative %: 61 % (ref 43–77)
Platelets: 254 10*3/uL (ref 150–400)
RBC: 4.49 MIL/uL (ref 3.87–5.11)
RDW: 15.5 % (ref 11.5–15.5)
WBC: 5.6 10*3/uL (ref 4.0–10.5)

## 2015-05-01 LAB — LIPID PANEL
Cholesterol: 159 mg/dL (ref 125–200)
HDL: 24 mg/dL — ABNORMAL LOW (ref >=46)
LDL Cholesterol: 83 mg/dL (ref <130)
Total CHOL/HDL Ratio: 6.6 Ratio — ABNORMAL HIGH (ref <=5.0)
Triglycerides: 261 mg/dL — ABNORMAL HIGH (ref <150)
VLDL: 52 mg/dL — ABNORMAL HIGH (ref <30)

## 2015-05-01 LAB — HEPATIC FUNCTION PANEL
ALT: 29 U/L (ref 6–29)
AST: 18 U/L (ref 10–35)
Albumin: 4.4 g/dL (ref 3.6–5.1)
Alkaline Phosphatase: 39 U/L (ref 33–130)
Bilirubin, Direct: 0.1 mg/dL (ref <=0.2)
Indirect Bilirubin: 0.3 mg/dL (ref 0.2–1.2)
Total Bilirubin: 0.4 mg/dL (ref 0.2–1.2)
Total Protein: 6.6 g/dL (ref 6.1–8.1)

## 2015-05-01 LAB — TSH: TSH: 0.87 mIU/L

## 2015-05-01 NOTE — Progress Notes (Signed)
MEDICARE ANNUAL WELLNESS VISIT AND FOLLOW UP  Assessment:    1. Postmenopausal  - DG Bone Density; Future  2. Essential hypertension -cont meds -monitor at home -DASH diet - TSH  3. Hypothyroidism, unspecified hypothyroidism type -cont levothyroxine - TSH  4. Type 2 diabetes mellitus with diabetic nephropathy, without long-term current use of insulin (HCC) -cont diet and exercise - Hemoglobin A1c  5. Type II diabetes mellitus with neurological manifestations (HCC) -cont diet and exercise - Hemoglobin A1c  6. Hyperlipidemia -cont diet and exercise - Lipid panel  7. Vitamin D deficiency -cont supplement  8. Medication management  - CBC with Differential/Platelet - BASIC METABOLIC PANEL WITH GFR - Hepatic function panel  9. Seizure disorder (HCC) -cont keppra - Levetiracetam level  10. Venous (peripheral) insufficiency -compression stockings  11. OSA on CPAP -cont cpap machine  12. Benign neoplasm of colon, unspecified part of colon -followed by colonoscopy  13. Gastroesophageal reflux disease, esophagitis presence not specified -avoid trigger foods  14. Iron deficiency anemia -cont supplement  15. BMI 39.86,   adult -cont diet and exercise  16. Morbid obesity, unspecified obesity type ([place]) -cont diet and exercise  17. Medicare annual wellness visit, initial -due next year  Over 30 minutes of exam, counseling, chart review, and critical decision making was performed  Plan:   During the course of the visit the patient was educated and counseled about appropriate screening and preventive services including:    Pneumococcal vaccine   Influenza vaccine  Td vaccine  Prevnar 13  Screening electrocardiogram  Screening mammography  Bone densitometry screening  Colorectal cancer screening  Diabetes screening  Glaucoma screening  Nutrition counseling   Advanced directives: given info/requested copies  Conditions/risks  identified: Diabetes is at goal, ACE/ARB therapy: No, Reason not on Ace Inhibitor/ARB therapy:  no indicated Urinary Incontinence is an issue: discussed non pharmacology and pharmacology options.  Fall risk: low- discussed PT, home fall assessment, medications.    Subjective:   [name] [name] is a 82 y.o. female who presents for Medicare Annual Wellness Visit and 3 month follow up on hypertension, prediabetes, hyperlipidemia, vitamin D def.  Date of last medicare wellness visit is 6/15.   Her blood pressure has been controlled at home, today their BP is BP: 126/70 mmHg She does workout. She denies chest pain, shortness of breath, dizziness.  She is on cholesterol medication and denies myalgias. Her cholesterol is at goal. The cholesterol last visit was:   Lab Results  Component Value Date   CHOL 156 12/12/2014   HDL 23* 12/12/2014   LDLCALC 68 12/12/2014   TRIG 326* 12/12/2014   CHOLHDL 6.8* 12/12/2014   She has been working on diet and exercise for prediabetes, and denies foot ulcerations, hyperglycemia, hypoglycemia , increased appetite, nausea, paresthesia of the feet, polydipsia, polyuria, visual disturbances, vomiting and weight loss. Last A1C in the office was:  Lab Results  Component Value Date   HGBA1C 6.7* 12/12/2014   Last GFR NonAA   Lab Results  Component Value Date   GFRNONAA 28* 12/12/2014   AA  Lab Results  Component Value Date   GFRAA 32* 12/12/2014   Patient is on Vitamin D supplement. Lab Results  Component Value Date   VD25OH 97 12/12/2014      Medication Review Current Outpatient Prescriptions on File Prior to Visit  Medication Sig Dispense Refill  . acetaminophen (TYLENOL) 500 MG tablet Take 1,000 mg by mouth every 6 (six) hours as needed for moderate pain.    Marland Kitchen  aspirin 81 MG tablet Take 81 mg by mouth daily.    Marland Kitchen atenolol (TENORMIN) 50 MG tablet Take 1 tablet (50 mg total) by mouth daily. 90 tablet 3  . Biotin 5000 MCG TABS Take 5,000 Units by  mouth 2 (two) times daily.     . Cholecalciferol (VITAMIN D PO) Take 5,000 Units by mouth 2 (two) times daily.    . ferrous sulfate dried (SLOW FE) 160 (50 FE) MG TBCR Take 160 mg by mouth 2 (two) times daily.     . fish oil-omega-3 fatty acids 1000 MG capsule Take 2 g by mouth at bedtime.     . Flaxseed, Linseed, (FLAX SEED OIL) 1000 MG CAPS Take 1 capsule by mouth 3 (three) times daily.    Marland Kitchen FREESTYLE INSULINX TEST test strip TEST once daily FOR BLOOD SUGAR EACH MORNING. 100 each PRN  . glimepiride (AMARYL) 4 MG tablet take 1 tablet by mouth once daily WITH BREAKFAST 90 tablet 1  . Lancets (FREESTYLE) lancets TEST once daily EACH MORNING. 100 each PRN  . lansoprazole (PREVACID) 30 MG capsule TAKE 1 BY MOUTH DAILY 90 capsule 4  . levETIRAcetam (KEPPRA) 500 MG tablet TAKE 1 BY MOUTH 3 TIMES DAILY AFTER MEALS 90 tablet 0  . levothyroxine (SYNTHROID, LEVOTHROID) 100 MCG tablet Take 1 tablet (100 mcg total) by mouth daily. 90 tablet 3  . loratadine (CLARITIN) 10 MG tablet Take 10 mg by mouth daily.    . magnesium oxide (MAG-OX) 400 MG tablet Take 400 mg by mouth 3 (three) times daily.     . meclizine (ANTIVERT) 25 MG tablet Take 1 tablet (25 mg total) by mouth daily. 90 tablet 3  . metFORMIN (GLUCOPHAGE-XR) 500 MG 24 hr tablet Take 1 tablet (500 mg total) by mouth 2 (two) times daily. 180 tablet 3  . saxagliptin HCl (ONGLYZA) 5 MG TABS tablet Take 1 tablet daily as directed for Diabetes 90 tablet 3  . vitamin B-12 (CYANOCOBALAMIN) 100 MCG tablet Take 50 mcg by mouth daily.    . vitamin C (ASCORBIC ACID) 500 MG tablet Take 500 mg by mouth daily.     No current facility-administered medications on file prior to visit.    Current Problems (verified) Patient Active Problem List   Diagnosis Date Noted  . BMI 39.86,   adult 12/12/2014  . Morbid obesity (BMI 39.86)  ([place]) 12/12/2014  . Medicare annual wellness visit, initial 12/12/2014  . OSA on CPAP 08/08/2014  . Type II diabetes mellitus with  neurological manifestations ([place]) 09/15/2013  . Hyperlipidemia 06/27/2013  . Vitamin D deficiency 06/27/2013  . Medication management 06/27/2013  . Seizure disorder ([place]) 11/04/2012  . Iron deficiency anemia 06/13/2008  . COLONIC POLYPS 06/09/2008  . Hypothyroidism 06/09/2008  . T2_NIDDM w/Stage 4 CKD (GFR 29 ml/min) 06/09/2008  . Venous (peripheral) insufficiency 06/09/2008  . GERD w/Hx of Esophageal Stricture 06/09/2008  . Essential hypertension 06/09/2008    Screening Tests Immunization History  Administered Date(s) Administered  . DTaP 03/18/2007  . Influenza, High Dose Seasonal PF 10/18/2013, 12/12/2014  . Influenza-Unspecified 11/18/2012  . Pneumococcal Conjugate-13 12/12/2014  . Pneumococcal Polysaccharide-23 09/16/2013  . Zoster 02/28/2005    Preventative care: Last colonoscopy: 2010 Last mammogram: 2017  DEXA: due  Prior vaccinations: TD or Tdap: 2009  Influenza: 2016  Pneumococcal: 2015 Prevnar13: 2016 Shingles/Zostavax: 2007  Names of Other Physician/Practitioners you currently use: 1. [facility]- here for primary care 2. Dr. Ellie Lunch, eye doctor, last visit 2017  3. Dr. Lovena Le, dentist, last visit 2017  Patient Care Team: Unk Pinto, MD as PCP - General (Internal Medicine) Rozetta Nunnery, MD as Consulting Physician (Otolaryngology) Teena Irani, MD as Consulting Physician (Gastroenterology) Consuela Mimes, MD as Consulting Physician (Oncology)  Past Surgical History  Procedure Laterality Date  . Tonsillectomy  ~ 1947  . Wrist surgery Left ~ 1950    "ran arm thru window"  . Cervical polypectomy    . Hammer toe surgery Left 1980's  . Mohs surgery  20087    "right upper outer lip"   Family History  Problem Relation Age of Onset  . Heart disease Mother   . Stroke Mother   . Heart disease Father   . Ovarian cancer Sister   . Hypertension Son   . Hypertension Son   . Diabetes Son   . Alcohol abuse Son     Social History  Substance Use Topics  . Smoking status: Former Smoker -- 1.00 packs/day for 30 years    Types: Cigarettes    Quit date: 06/29/1983  . Smokeless tobacco: Never Used  . Alcohol Use: 0.0 oz/week    0 Standard drinks or equivalent per week     Comment: 09/15/2013 "glass of wine maybe once/year"    MEDICARE WELLNESS OBJECTIVES: Tobacco use: She does not smoke.  Patient is a former smoker. If yes, counseling given Alcohol Current alcohol use: social drinker Osteoporosis: postmenopausal estrogen deficiency, History of fracture in the past year: no Fall risk: Low Risk Hearing: normal Visual acuity: normal,  does perform annual eye exam Diet: well balanced Physical activity: Current Exercise Habits: The patient does not participate in regular exercise at present Cardiac risk factors: Cardiac Risk Factors include: advanced age (>38men, >66 women);diabetes mellitus;dyslipidemia;family history of premature cardiovascular disease;obesity (BMI >30kg/m2);sedentary lifestyle Depression/mood screen:   Depression screen [facility] 2/9 05/01/2015  Decreased Interest 0  Down, Depressed, Hopeless 0  PHQ - 2 Score 0    ADLs:  In your present state of health, do you have any difficulty performing the following activities: 05/01/2015 12/12/2014  Hearing? Tempie Donning  Vision? N N  Difficulty concentrating or making decisions? N -  Walking or climbing stairs? N -  Dressing or bathing? N -  Doing errands, shopping? N -  Preparing Food and eating ? N -  Using the Toilet? N -  In the past six months, have you accidently leaked urine? N -  Do you have problems with loss of bowel control? N -  Managing your Medications? N -  Managing your Finances? N -  Housekeeping or managing your Housekeeping? N -     Cognitive Testing  Alert? Yes  Normal Appearance?Yes  Oriented to person? Yes  Place? Yes   Time? Yes  Recall of three objects?  Yes  Can perform simple calculations? Yes  Displays appropriate  judgment?Yes  Can read the correct time from a watch face?Yes  EOL planning: Does patient have an advance directive?: No Would patient like information on creating an advanced directive?: No - patient declined information   Objective:   Today's Vitals   05/01/15 1352  BP: 126/70  Pulse: 78  Temp: 98.2 F (36.8 C)  TempSrc: Temporal  Resp: 16  Height: 5' 1.75" (1.568 m)  Weight: 218 lb (98.884 kg)   Body mass index is 40.22 kg/(m^2).  General appearance: alert, no distress, WD/WN,  female HEENT: normocephalic, sclerae anicteric, TMs pearly, nares patent, no discharge or erythema, pharynx normal Oral cavity: MMM,  no lesions Neck: supple, no lymphadenopathy, no thyromegaly, no masses Heart: RRR, normal S1, S2, no murmurs Lungs: CTA bilaterally, no wheezes, rhonchi, or rales Abdomen: +bs, soft, non tender, non distended, no masses, no hepatomegaly, no splenomegaly Musculoskeletal: nontender, no swelling, no obvious deformity Extremities: no edema, no cyanosis, no clubbing Pulses: 2+ symmetric, upper and lower extremities, normal cap refill Neurological: alert, oriented x 3, CN2-12 intact, strength normal upper extremities and lower extremities, sensation normal throughout, DTRs 2+ throughout, no cerebellar signs, gait normal Psychiatric: normal affect, behavior normal, pleasant  Breast: defer Gyn: defer Rectal: defer   Medicare Attestation I have personally reviewed: The patient's medical and social history Their use of alcohol, tobacco or illicit drugs Their current medications and supplements The patient's functional ability including ADLs,fall risks, home safety risks, cognitive, and hearing and visual impairment Diet and physical activities Evidence for depression or mood disorders  The patient's weight, height, BMI, and visual acuity have been recorded in the chart.  I have made referrals, counseling, and provided education to the patient based on review of the above  and I have provided the patient with a written personalized care plan for preventive services.     Starlyn Skeans, PA-C   05/01/2015

## 2015-05-02 LAB — HEMOGLOBIN A1C
Hgb A1c MFr Bld: 7.1 % — ABNORMAL HIGH (ref <5.7)
Mean Plasma Glucose: 157 mg/dL — ABNORMAL HIGH (ref <117)

## 2015-05-03 ENCOUNTER — Telehealth: Payer: Self-pay | Admitting: Internal Medicine

## 2015-05-03 NOTE — Telephone Encounter (Signed)
Patient returned call stating that she is not taking any calcium products.  Will run Vit D, PTH, Calcium level to ensure no parathyroid issues.  Will have front office call for lab only appointment.

## 2015-05-04 LAB — LEVETIRACETAM LEVEL: Keppra (Levetiracetam): 31.8 ug/mL

## 2015-05-08 ENCOUNTER — Other Ambulatory Visit: Payer: Medicare Other

## 2015-05-08 DIAGNOSIS — Z780 Asymptomatic menopausal state: Secondary | ICD-10-CM

## 2015-05-08 LAB — PHOSPHORUS: Phosphorus: 3.3 mg/dL (ref 2.1–4.3)

## 2015-05-08 LAB — CALCIUM: Calcium: 10.4 mg/dL (ref 8.6–10.4)

## 2015-05-09 LAB — PTH, INTACT AND CALCIUM
Calcium: 10.4 mg/dL (ref 8.4–10.5)
PTH: 58 pg/mL (ref 14–64)

## 2015-05-09 LAB — VITAMIN D 25 HYDROXY (VIT D DEFICIENCY, FRACTURES): Vit D, 25-Hydroxy: 81 ng/mL (ref 30–100)

## 2015-06-01 ENCOUNTER — Encounter: Payer: Self-pay | Admitting: Internal Medicine

## 2015-08-07 ENCOUNTER — Ambulatory Visit: Payer: Self-pay | Admitting: Internal Medicine

## 2015-08-09 ENCOUNTER — Other Ambulatory Visit: Payer: Self-pay | Admitting: *Deleted

## 2015-08-09 DIAGNOSIS — E2839 Other primary ovarian failure: Secondary | ICD-10-CM

## 2015-08-10 ENCOUNTER — Other Ambulatory Visit: Payer: Self-pay | Admitting: *Deleted

## 2015-08-10 MED ORDER — GLIMEPIRIDE 4 MG PO TABS: ORAL_TABLET | ORAL | Status: DC

## 2015-08-28 ENCOUNTER — Ambulatory Visit (INDEPENDENT_AMBULATORY_CARE_PROVIDER_SITE_OTHER): Payer: Medicare Other | Admitting: Internal Medicine

## 2015-08-28 ENCOUNTER — Encounter: Payer: Self-pay | Admitting: Internal Medicine

## 2015-08-28 VITALS — BP 140/72 | HR 72 | Temp 97.2°F | Resp 16 | Ht 62.0 in | Wt 218.6 lb

## 2015-08-28 DIAGNOSIS — I10 Essential (primary) hypertension: Secondary | ICD-10-CM

## 2015-08-28 DIAGNOSIS — N184 Chronic kidney disease, stage 4 (severe): Secondary | ICD-10-CM | POA: Diagnosis not present

## 2015-08-28 DIAGNOSIS — E782 Mixed hyperlipidemia: Secondary | ICD-10-CM

## 2015-08-28 DIAGNOSIS — E559 Vitamin D deficiency, unspecified: Secondary | ICD-10-CM | POA: Diagnosis not present

## 2015-08-28 DIAGNOSIS — E039 Hypothyroidism, unspecified: Secondary | ICD-10-CM

## 2015-08-28 DIAGNOSIS — G40909 Epilepsy, unspecified, not intractable, without status epilepticus: Secondary | ICD-10-CM | POA: Diagnosis not present

## 2015-08-28 DIAGNOSIS — E1122 Type 2 diabetes mellitus with diabetic chronic kidney disease: Secondary | ICD-10-CM | POA: Diagnosis not present

## 2015-08-28 DIAGNOSIS — Z79899 Other long term (current) drug therapy: Secondary | ICD-10-CM

## 2015-08-28 LAB — CBC WITH DIFFERENTIAL/PLATELET
Basophils Absolute: 0 cells/uL (ref 0–200)
Basophils Relative: 0 %
Eosinophils Absolute: 572 cells/uL — ABNORMAL HIGH (ref 15–500)
Eosinophils Relative: 11 %
HCT: 39.5 % (ref 35.0–45.0)
Hemoglobin: 12.6 g/dL (ref 11.7–15.5)
Lymphocytes Relative: 27 %
Lymphs Abs: 1404 cells/uL (ref 850–3900)
MCH: 29.4 pg (ref 27.0–33.0)
MCHC: 31.9 g/dL — ABNORMAL LOW (ref 32.0–36.0)
MCV: 92.1 fL (ref 80.0–100.0)
MPV: 10.3 fL (ref 7.5–12.5)
Monocytes Absolute: 364 cells/uL (ref 200–950)
Monocytes Relative: 7 %
Neutro Abs: 2860 cells/uL (ref 1500–7800)
Neutrophils Relative %: 55 %
Platelets: 256 10*3/uL (ref 140–400)
RBC: 4.29 MIL/uL (ref 3.80–5.10)
RDW: 15.6 % — ABNORMAL HIGH (ref 11.0–15.0)
WBC: 5.2 10*3/uL (ref 3.8–10.8)

## 2015-08-28 LAB — LIPID PANEL
Cholesterol: 159 mg/dL (ref 125–200)
HDL: 25 mg/dL — ABNORMAL LOW (ref >=46)
LDL Cholesterol: 72 mg/dL (ref <130)
Total CHOL/HDL Ratio: 6.4 Ratio — ABNORMAL HIGH (ref <=5.0)
Triglycerides: 310 mg/dL — ABNORMAL HIGH (ref <150)
VLDL: 62 mg/dL — ABNORMAL HIGH (ref <30)

## 2015-08-28 LAB — HEPATIC FUNCTION PANEL
ALT: 34 U/L — ABNORMAL HIGH (ref 6–29)
AST: 21 U/L (ref 10–35)
Albumin: 4.5 g/dL (ref 3.6–5.1)
Alkaline Phosphatase: 36 U/L (ref 33–130)
Bilirubin, Direct: 0.1 mg/dL (ref <=0.2)
Indirect Bilirubin: 0.3 mg/dL (ref 0.2–1.2)
Total Bilirubin: 0.4 mg/dL (ref 0.2–1.2)
Total Protein: 6.6 g/dL (ref 6.1–8.1)

## 2015-08-28 LAB — TSH: TSH: 1.56 mIU/L

## 2015-08-28 LAB — BASIC METABOLIC PANEL WITH GFR
BUN: 28 mg/dL — ABNORMAL HIGH (ref 7–25)
CO2: 24 mmol/L (ref 20–31)
Calcium: 10.5 mg/dL — ABNORMAL HIGH (ref 8.6–10.4)
Chloride: 102 mmol/L (ref 98–110)
Creat: 1.74 mg/dL — ABNORMAL HIGH (ref 0.60–0.88)
GFR, Est African American: 31 mL/min — ABNORMAL LOW (ref >=60)
GFR, Est Non African American: 27 mL/min — ABNORMAL LOW (ref >=60)
Glucose, Bld: 238 mg/dL — ABNORMAL HIGH (ref 65–99)
Potassium: 4.8 mmol/L (ref 3.5–5.3)
Sodium: 136 mmol/L (ref 135–146)

## 2015-08-28 LAB — MAGNESIUM: Magnesium: 2 mg/dL (ref 1.5–2.5)

## 2015-08-28 LAB — HEMOGLOBIN A1C
Hgb A1c MFr Bld: 6.9 % — ABNORMAL HIGH (ref <5.7)
Mean Plasma Glucose: 151 mg/dL

## 2015-08-28 NOTE — Progress Notes (Signed)
Patient ID: [name] [name], female   DOB: 1932/09/05, 82 y.o.   MRN: [id number]  [facility]                       Unk Pinto, M.D.        Uvaldo Bristle. Silverio Lay, P.A.-C       Starlyn Skeans, P.A.-C   [facility]                [address] [place] [id number] Telephone [phone] Telefax [phone] ______________________________________________________________________     This very nice 82 y.o. [place] presents for 6 month follow up with Hypertension, Hyperlipidemia,  T2_NIDDM w/ CKD 3 and Vitamin D Deficiency.      Patient is treated for HTN  circa 1999 & BP has been controlled at home. Today's BP is 140/72 mmHg. Patient has had no complaints of any cardiac type chest pain, palpitations, dyspnea/orthopnea/PND, dizziness, claudication, or dependent edema.     Hyperlipidemia is controlled with diet & meds. Patient denies myalgias or other med SE's.   Current  Lipids are at goal with  Cholesterol 159; HDL 25*; LDL 72;  But with elevated Triglycerides 310*.     Also, the patient has history of poor compliance with Morbid Obesity (BMI 40 ) due to compulsive gluttony and consequent T2_NIDDM circa 2001 w/CKD 3 (GFR 32 ml/min) and she has had no symptoms of reactive hypoglycemia, diabetic polys, paresthesias or visual blurring.  She alleges her CBG's range 150-160 mg% .  Current  A1c is not at goal with A1c  6.9%.     Further, the patient also has history of Vitamin D Deficiency of "37" in 2008 and recently has been off of vitamin D  Due to elevated calcium which may well have been a la error as repeat Calcium was normal as was PTH level. Irregardless she did not understand to restart her Vit D supplements. . Last vitamin D was  81 on 05/08/2015 whoile she was taking 10,000 units/day.    Medication Sig  . acetaminophen  500 MG Take 1,000 mg by mouth every 6 (six) hours as needed for moderate pain.  Marland Kitchen  aspirin 81 MG  Take 81 mg by mouth daily.  Marland Kitchen atenolol  50 MG  Take 1 tablet (50 mg total) by mouth daily.  . Biotin 5000 MCG  Take 5,000 Units by mouth 2 (two) times daily.   Marland Kitchen SLOW FE 160 (50 FE) Take 160 mg by mouth 2 (two) times daily.   . fish oil-omega-3  1000 MG  Take 2 g by mouth at bedtime.   Marland Kitchen FLAX SEED OIL 1000 MG  Take 1 capsule by mouth 3 (three) times daily.  Marland Kitchen glimepiride  4 MG  take 1 tablet by mouth once daily WITH BREAKFAST  . lansoprazole  30 MG  TAKE 1 BY MOUTH DAILY  . levETIRAcetam  500 MG  TAKE 1 BY MOUTH 3 TIMES DAILY AFTER MEALS  . levothyroxine100 MCG  Take 1 tablet (100 mcg total) by mouth daily.  Marland Kitchen loratadine  10 MG  Take 10 mg by mouth daily.  . magnesium  400 MG  Take 400 mg by mouth 3 (three) times daily.   . meclizine  25 MG  Take 1 tablet (25 mg total) by mouth daily.  . metFORMIN -XR 500 MG  Take 1 tablet (500 mg total) by mouth 2 (two) times daily.  . saxagliptin / ONGLYZA 5 MG  Take 1 tablet daily as directed for Diabetes  . vitamin B-12  100 MCG  Take 50 mcg by mouth daily.  . vitamin C  500 MG  Take 500 mg by mouth daily.  Marland Kitchen VIT  D   5,000 Units Take   2  times daily.   OFF x 3 Months . Reported on 08/28/2015   Allergies  Allergen Reactions  . Ppd [Tuberculin Purified Protein Derivative] Other (See Comments)    indurated   PMHx:   Past Medical History  Diagnosis Date  . Anemia, unspecified   . Meniere's disease   . UTI (urinary tract infection)   . High cholesterol   . Other forms of epilepsy and recurrent seizures without mention of intractable epilepsy 11/04/2012    Non convulsive paroxysmal spells, responding to [facility], patient not driving.   Marland Kitchen GERD (gastroesophageal reflux disease)   . Vitamin D deficiency   . Gout   . Hypothyroidism   . Obesity (BMI 30-39.9)   . Hypertension   . Pneumonia ~ 1943  . Type II or unspecified type diabetes mellitus without mention of complication, not stated as uncontrolled   . DM neuropathy, type II diabetes  mellitus ([place])   . Epilepsy ([place])   . Arthritis     "knees; right shoulder" (09/15/2013)  . Basal cell carcinoma     "right upper outer lip"  . Skin cancer     "forehead; right hand"   Immunization History  Administered Date(s) Administered  . DTaP 03/18/2007  . Influenza, High Dose Seasonal PF 10/18/2013, 12/12/2014  . Influenza-Unspecified 11/18/2012  . Pneumococcal Conjugate-13 12/12/2014  . Pneumococcal Polysaccharide-23 09/16/2013  . Zoster 02/28/2005   Past Surgical History  Procedure Laterality Date  . Tonsillectomy  ~ 1947  . Wrist surgery Left ~ 1950    "ran arm thru window"  . Cervical polypectomy    . Hammer toe surgery Left 1980's  . Mohs surgery  20087    "right upper outer lip"   FHx:    Reviewed / unchanged  SHx:    Reviewed / unchanged  Systems Review:  Constitutional: Denies fever, chills, wt changes, headaches, insomnia, fatigue, night sweats, change in appetite. Eyes: Denies redness, blurred vision, diplopia, discharge, itchy, watery eyes.  ENT: Denies discharge, congestion, post nasal drip, epistaxis, sore throat, earache, hearing loss, dental pain, tinnitus, vertigo, sinus pain, snoring.  CV: Denies chest pain, palpitations, irregular heartbeat, syncope, dyspnea, diaphoresis, orthopnea, PND, claudication or edema. Respiratory: denies cough, dyspnea, DOE, pleurisy, hoarseness, laryngitis, wheezing.  Gastrointestinal: Denies dysphagia, odynophagia, heartburn, reflux, water brash, abdominal pain or cramps, nausea, vomiting, bloating, diarrhea, constipation, hematemesis, melena, hematochezia  or hemorrhoids. Genitourinary: Denies dysuria, frequency, urgency, nocturia, hesitancy, discharge, hematuria or flank pain. Musculoskeletal: Denies arthralgias, myalgias, stiffness, jt. swelling, pain, limping or strain/sprain.  Skin: Denies pruritus, rash, hives, warts, acne, eczema or change in skin lesion(s). Neuro: No weakness, tremor, incoordination, spasms,  paresthesia or pain. Psychiatric: Denies confusion, memory loss or sensory loss. Endo: Denies change in weight, skin or hair change.  Heme/Lymph: No excessive bleeding, bruising or enlarged lymph nodes.  Physical Exam  BP 140/72   Pulse 72  Temp97.2 F  Resp 16  Ht 5\' 2"   Wt 218 lb 9.6 oz    BMI 40  Appears morbidly over nourished and in no distress.  Eyes: PERRLA, EOMs, conjunctiva no swelling or  erythema. Sinuses: No frontal/maxillary tenderness ENT/Mouth: EAC's clear, TM's nl w/o erythema, bulging. Nares clear w/o erythema, swelling, exudates. Oropharynx clear without erythema or exudates. Oral hygiene is good. Tongue normal, non obstructing. Hearing intact.  Neck: Supple. Thyroid nl. Car 2+/2+ without bruits, nodes or JVD. Chest: Respirations nl with BS clear & equal w/o rales, rhonchi, wheezing or stridor.  Cor: Heart sounds normal w/ regular rate and rhythm without sig. murmurs, gallops, clicks, or rubs. Peripheral pulses normal and equal  without edema.  Abdomen: Soft , rotund & bowel sounds normal. Non-tender w/o guarding, rebound, hernias, masses, or organomegaly.  Lymphatics: Unremarkable.  Musculoskeletal: Full ROM all peripheral extremities, joint stability, 5/5 strength, and normal gait.  Skin: Warm, dry without exposed rashes, lesions or ecchymosis apparent.  Neuro: Cranial nerves intact, reflexes equal bilaterally. Sensory-motor testing grossly intact. Tendon reflexes grossly intact.  Pysch: Alert & oriented x 3.  Insight and judgement nl & appropriate. No ideations.  Assessment and Plan:  1. Essential hypertension  - Continue medication, monitor blood pressure at home. Continue DASH diet. Reminder to go to the ER if any CP, SOB, nausea, dizziness, severe HA, changes vision/speech, left arm numbness and tingling and jaw pain. - TSH  2. Hyperlipidemia  - Continue diet/meds, exercise,& lifestyle modifications. Continue monitor periodic cholesterol/liver & renal  functions  - Lipid panel - TSH  3. Type 2 diabetes mellitus with stage 4 chronic kidney disease, without long-term current use of insulin (HCC)  - Hemo - Continue diet, exercise, lifestyle modifications. Monitor appropriate labs.globin A1c - Insulin, random  4. Vitamin D deficiency  - Continue supplementation. - VITAMIN D 25 Hydroxy  5. Morbid obesity due to excess calories ([place])   6. Seizure disorder (HCC)  - Levetiracetam level  7. Hypothyroidism  - TSH  8. Medication management  - CBC with Differential/Platelet - BASIC METABOLIC PANEL WITH GFR - Hepatic function panel - Magnesium - Levetiracetam level   Recommended regular exercise, BP monitoring, weight control, and discussed med and SE's. Recommended labs to assess and monitor clinical status. Further disposition pending results of labs. Over 30 minutes of exam, counseling, chart review was performed

## 2015-08-28 NOTE — Patient Instructions (Signed)

## 2015-08-29 ENCOUNTER — Other Ambulatory Visit: Payer: Self-pay | Admitting: Internal Medicine

## 2015-08-29 DIAGNOSIS — E1122 Type 2 diabetes mellitus with diabetic chronic kidney disease: Secondary | ICD-10-CM

## 2015-08-29 DIAGNOSIS — N183 Chronic kidney disease, stage 3 (moderate): Principal | ICD-10-CM

## 2015-08-29 LAB — VITAMIN D 25 HYDROXY (VIT D DEFICIENCY, FRACTURES): Vit D, 25-Hydroxy: 79 ng/mL (ref 30–100)

## 2015-08-29 LAB — INSULIN, RANDOM: Insulin: 70.2 u[IU]/mL — ABNORMAL HIGH (ref 2.0–19.6)

## 2015-08-29 MED ORDER — METFORMIN HCL ER 500 MG PO TB24: ORAL_TABLET | ORAL | Status: DC

## 2015-08-31 LAB — LEVETIRACETAM LEVEL: Keppra (Levetiracetam): 34.3 ug/mL

## 2015-09-06 ENCOUNTER — Ambulatory Visit
Admission: RE | Admit: 2015-09-06 | Discharge: 2015-09-06 | Disposition: A | Discharge status: Home / Self Care | Payer: Medicare Other | Source: Ambulatory Visit | Attending: Internal Medicine | Admitting: Internal Medicine

## 2015-09-06 DIAGNOSIS — E2839 Other primary ovarian failure: Secondary | ICD-10-CM

## 2015-09-18 ENCOUNTER — Other Ambulatory Visit: Payer: Self-pay | Admitting: *Deleted

## 2015-09-18 MED ORDER — FREESTYLE LANCETS MISC: 1 refills | Status: DC

## 2015-09-18 MED ORDER — GLUCOSE BLOOD VI STRP: ORAL_STRIP | 1 refills | Status: DC

## 2015-10-24 ENCOUNTER — Encounter: Payer: Self-pay | Admitting: Internal Medicine

## 2015-10-24 ENCOUNTER — Ambulatory Visit (INDEPENDENT_AMBULATORY_CARE_PROVIDER_SITE_OTHER): Payer: Medicare Other | Admitting: Internal Medicine

## 2015-10-24 VITALS — BP 158/74 | HR 96 | Temp 97.8°F | Resp 16 | Ht 62.0 in | Wt 215.0 lb

## 2015-10-24 DIAGNOSIS — Z23 Encounter for immunization: Secondary | ICD-10-CM

## 2015-10-24 DIAGNOSIS — L97511 Non-pressure chronic ulcer of other part of right foot limited to breakdown of skin: Secondary | ICD-10-CM

## 2015-10-24 MED ORDER — FUROSEMIDE 40 MG PO TABS: 40.0000 mg | ORAL_TABLET | Freq: Every day | ORAL | 11 refills | Status: DC

## 2015-10-24 MED ORDER — DOXYCYCLINE HYCLATE 100 MG PO CAPS: 100.0000 mg | ORAL_CAPSULE | Freq: Two times a day (BID) | ORAL | 0 refills | Status: DC

## 2015-10-24 MED ORDER — POTASSIUM CHLORIDE CRYS ER 20 MEQ PO TBCR: 20.0000 meq | EXTENDED_RELEASE_TABLET | Freq: Every day | ORAL | 0 refills | Status: DC

## 2015-10-24 MED ORDER — MUPIROCIN 2 % EX OINT: 1.0000 "application " | TOPICAL_OINTMENT | Freq: Two times a day (BID) | CUTANEOUS | 0 refills | Status: DC

## 2015-10-24 NOTE — Patient Instructions (Signed)
Please use the bactroban ointment on your wound twice daily.  Keep it clean and dry.  You can wash it with soap and water.  Fraser Din it with a towel to dry.  Please keep it covered when wearing shoes.  You should stick to the [employer] shoes for the next week.  Try to go barefoot at home.     Please take the doxycycline until it is gone.  Take it with food.  Please call the office if you haven't heard from [place] in the next couple days about getting in to see Dr. Paulla Dolly.  Please take lasix as needed for swelling in your foot and ankle.  If you take a lasix take potassium with it.   Please be close to the bathroom with the fluid pills.  You can wear compression socks either Dr. Zoe Lan and Konrad Dolores Copper both have brands over the counter.  Don't wear them at nighttime.

## 2015-10-24 NOTE — Progress Notes (Signed)
   Subjective:    Patient ID: [name] [name], female    DOB: May 19, 1932, 82 y.o.   MRN: [id number]  HPI   Patient presents to the office for evaluation of right sided redness and wound on her foot.  She reports that she did notice a wound on her foot which she thinks may have come from her shoe.  She notes that she has gotten some increasing redness and some swelling to the foot.  She has been using neosporin on the wound. She had had a pedicure in the last week to 2 weeks.  She reports that she is getting a lot of swelling has been worse in the last couple weeks.     Review of Systems  Constitutional: Negative for chills, fatigue and fever.  Gastrointestinal: Negative for nausea and vomiting.  Skin: Positive for rash and wound.       Objective:   Physical Exam  Constitutional: She is oriented to person, place, and time. She appears well-developed and well-nourished. No distress.  HENT:  Head: Normocephalic.  Mouth/Throat: Oropharynx is clear and moist. No oropharyngeal exudate.  Eyes: Conjunctivae are normal. No scleral icterus.  Neck: Normal range of motion. Neck supple. No JVD present. No thyromegaly present.  Cardiovascular: Normal rate, regular rhythm, normal heart sounds and intact distal pulses.  Exam reveals no gallop and no friction rub.   No murmur heard. Pulmonary/Chest: Effort normal and breath sounds normal. No respiratory distress. She has no wheezes. She has no rales. She exhibits no tenderness.  Musculoskeletal: Normal range of motion.  Lymphadenopathy:    She has no cervical adenopathy.  Neurological: She is alert and oriented to person, place, and time.  Skin: Skin is warm and dry. She is not diaphoretic.     Psychiatric: She has a normal mood and affect. Her behavior is normal. Judgment and thought content normal.  Nursing note and vitals reviewed.   Vitals:   10/24/15 1551  BP: (!) 158/74  Pulse: 96  Resp: 16  Temp: 97.8 F (36.6 C)            Assessment & Plan:    1. Need for prophylactic vaccination and inoculation against influenza  - Flu vaccine HIGH DOSE PF (Fluzone High dose)  2. Right foot ulcer, limited to breakdown of skin (HCC)  - furosemide (LASIX) 40 MG tablet; Take 1 tablet (40 mg total) by mouth daily.  Dispense: 30 tablet; Refill: 11 - potassium chloride SA (K-DUR,KLOR-CON) 20 MEQ tablet; Take 1 tablet (20 mEq total) by mouth daily.  Dispense: 30 tablet; Refill: 0 - doxycycline (VIBRAMYCIN) 100 MG capsule; Take 1 capsule (100 mg total) by mouth 2 (two) times daily. One po bid x 7 days  Dispense: 14 capsule; Refill: 0 - mupirocin ointment (BACTROBAN) 2 %; Apply 1 application topically 2 (two) times daily.  Dispense: 22 g; Refill: 0 - Ambulatory referral to Podiatry

## 2015-11-10 ENCOUNTER — Ambulatory Visit (INDEPENDENT_AMBULATORY_CARE_PROVIDER_SITE_OTHER): Payer: Medicare Other | Admitting: Podiatry

## 2015-11-10 ENCOUNTER — Ambulatory Visit (INDEPENDENT_AMBULATORY_CARE_PROVIDER_SITE_OTHER): Payer: Medicare Other

## 2015-11-10 ENCOUNTER — Encounter: Payer: Self-pay | Admitting: Podiatry

## 2015-11-10 VITALS — BP 138/69 | HR 88 | Resp 16 | Ht 62.0 in | Wt 215.0 lb

## 2015-11-10 DIAGNOSIS — L89891 Pressure ulcer of other site, stage 1: Secondary | ICD-10-CM

## 2015-11-10 DIAGNOSIS — E1149 Type 2 diabetes mellitus with other diabetic neurological complication: Secondary | ICD-10-CM | POA: Diagnosis not present

## 2015-11-10 DIAGNOSIS — M79671 Pain in right foot: Secondary | ICD-10-CM | POA: Diagnosis not present

## 2015-11-10 DIAGNOSIS — E1140 Type 2 diabetes mellitus with diabetic neuropathy, unspecified: Secondary | ICD-10-CM | POA: Diagnosis not present

## 2015-11-10 DIAGNOSIS — L98492 Non-pressure chronic ulcer of skin of other sites with fat layer exposed: Secondary | ICD-10-CM

## 2015-11-10 NOTE — Progress Notes (Signed)
   Subjective:    Patient ID: [name] [name], female    DOB: 04-28-1932, 82 y.o.   MRN: [id number]  HPI  Chief Complaint  Patient presents with  . Foot Pain    Right foot / lateral at 5th met / small open wound x 3 wks.  Pt states she thinks it may have come from something that way put on her foot during a pedicure.          Review of Systems  HENT: Positive for hearing loss.   Musculoskeletal: Positive for myalgias.  Skin: Positive for wound.  Neurological: Positive for seizures.       Objective:   Physical Exam        Assessment & Plan:

## 2015-11-13 NOTE — Progress Notes (Signed)
Subjective:     Patient ID: [name] [name], female   DOB: 23-Mar-1932, 82 y.o.   MRN: [id number]  HPI patient presents stating that she's developed a small lesion on the right lateral foot and she does not remember specific injury but it seemed to occur after a pedicure and she does have long-term diabetes   Review of Systems  All other systems reviewed and are negative.      Objective:   Physical Exam  Constitutional: She is oriented to person, place, and time.  Cardiovascular: Intact distal pulses.   Musculoskeletal: Normal range of motion.  Neurological: She is oriented to person, place, and time.  Skin: Skin is warm and dry.  Nursing note and vitals reviewed.  vascular status was found to be intact with patient's neurological status mildly diminished sharp [facility] vibratory and tactile testing. Patient is noted on the right lateral foot to have a approximate 5 x 5 mm opening that is opened through skin with no subcutaneous exposure currently there is no proximal edema erythema or drainage noted and it is a local process with no indications of systemic problems. Patient does have long-term diabetes with foot manifestations neurologically and mild vascular     Assessment:     At risk diabetic with small ulceration right lateral foot    Plan:     H&P x-ray reviewed and at this time I dispensed a surgical shoe to keep pressure off the area applied Iodosorb after cleaning up the wound site and sterile dressing with padding. Instructed how to use padding at home along with soaks and not wearing any shoes pressing on it and it should heal uneventfully. I also recommended long-term diabetic shoes and patient is going to be approved to have these made due to her at risk condition and ulceration  X-ray report was negative for signs of ostial lysis or other pathology

## 2015-11-20 ENCOUNTER — Encounter: Payer: Self-pay | Admitting: Neurology

## 2015-11-20 ENCOUNTER — Ambulatory Visit (INDEPENDENT_AMBULATORY_CARE_PROVIDER_SITE_OTHER): Payer: Medicare Other | Admitting: Neurology

## 2015-11-20 VITALS — BP 140/80 | HR 96 | Resp 20 | Ht 62.0 in | Wt 213.0 lb

## 2015-11-20 DIAGNOSIS — H814 Vertigo of central origin: Secondary | ICD-10-CM

## 2015-11-20 DIAGNOSIS — H8103 Meniere's disease, bilateral: Secondary | ICD-10-CM

## 2015-11-20 DIAGNOSIS — Z760 Encounter for issue of repeat prescription: Secondary | ICD-10-CM

## 2015-11-20 DIAGNOSIS — H8149 Vertigo of central origin, unspecified ear: Secondary | ICD-10-CM

## 2015-11-20 NOTE — Patient Instructions (Signed)
Meniere Disease Meniere disease is an inner ear disorder. It causes attacks of a spinning sensation (vertigo) and ringing in the ear (tinnitus). It also causes hearing loss and a sensation of fullness or pressure in your ear.  Meniere disease is lifelong. It may get worse over time. Symptoms usually begin in one ear but may eventually affect both ears.  CAUSES Meniere disease is caused by having too much of the fluid that is in your inner ear (endolymph). When endolymph builds up in your inner ear, it affects the nerves that control balance and hearing. The reason for the endolymph buildup is not known. Possible causes include:  Allergy.  An abnormal reaction of the body's defense system (autoimmune disease).  Viral infection of the inner ear.  Head injury. RISK FACTORS  Age older than 40 years.  Family history of Meniere disease.  History of autoimmune disease.  History of migraine headaches. SIGNS AND SYMPTOMS Symptoms of Meniere disease can come and go and may last for up to 4 hours at a time. Symptoms usually start in one ear and may become more frequent and eventually involve both ears. Symptoms can include:  Fullness and pressure in your ear.  Roaring or ringing in your ear.  Vertigo and loss of balance.  Decreased hearing.  Nausea and vomiting. DIAGNOSIS Your health care provider will perform a physical exam. Tests may be done to confirm a diagnosis of Meniere disease. These tests may include:  A hearing test (audiogram).  An electronystagmogram. This tests your balance nerve (vestibular nerve).  Imaging studies, such as CT or MRI, of your inner ear. TREATMENT There is no cure for Meniere disease, but it can be managed. Management may include:  A diet that may help relieve symptoms of Meniere disease.  Use of medicines to reduce:  Vertigo.  Nausea.  Fluid retention.  Use of an air pressure pulse generator. This is a machine that sends small pressure  pulses into your ear canal.  Inner ear surgery (rare). When you experience symptoms, it can be helpful to lie down on a flat surface and focus your eyes on one object that does not move. Try to stay in that position until your symptoms go away.  HOME CARE INSTRUCTIONS   Take medicines only as directed by your health care provider.  Eat the same amount of food at the same time every day, including snacks.  Do not skip meals.  Limit the salt in your diet to 1,000 mg a day.  Avoid caffeine.  Limit alcoholic drinks to one drink a day.  Do not eat foods containing monosodium glutamate (MSG).  Drink enough fluids to keep your urine clear or pale yellow.  Do not use any tobacco products including cigarettes, chewing tobacco, or electronic cigarettes. If you need help quitting, ask your health care provider.  Find ways to reduce or avoid stress. SEEK MEDICAL CARE IF:   You have symptoms that last longer than 4 hours.  You have new or more severe symptoms. SEEK IMMEDIATE MEDICAL CARE IF:   You have been vomiting for 24 hours.  You are not able to keep fluids down.  You have chest pain or trouble breathing.   This information is not intended to replace advice given to you by your health care provider. Make sure you discuss any questions you have with your health care provider.   Document Released: 02/02/2000 Document Revised: 02/25/2014 Document Reviewed: 01/18/2013 Elsevier Interactive Patient Education [employer].

## 2015-11-20 NOTE — Progress Notes (Signed)
[facility]  Provider:  Larey Seat, M D  Referring Provider: Unk Pinto, MD Primary Care Physician:  Alesia Richards, MD  Chief Complaint  Patient presents with  . Follow-up    seizures, not using cpap--Dr. Melford Aase prescribed it, uses [facility]    HPI:  [name] [name] is a 82 y.o. female  Is seen here as a referral/ revisit  from Dr. Jones Broom. This patent was last seen in 2009 by myself and yearly by carolyn martin,GNP here at the [facility] office.   [name] [name] is meanwhile 82 years old she returns for repeat routine followup visit she has a history of possible seizures EEG has been normal in the past she also had a normal MRI of the brain in 2012. She presented first in 2008 with vertigo . The patient was placed on Keppra , generic form , by her pharmacy. The next visit with our office we changed her to and brand-name only. The patient has also a history of Mnire's disease. She lives at the [facility] home. The patient had an 2001 developed multiple spells of vertigo that were treated with Antivert. However it seems that at the vertigo lasted 2 minutes, was associated with LOC or awareness and therefore a  possible seizure manifestation. Spells  had always lasted 2-3 minutes , are not preceded by an aura. Her last manifestation of what is likely a seizure was in 2012.  She remains Keppra-side effects free, she has lost weight by controlling her portions, had neither falls nor progressing  hearing loss. She is using hearing aids now.  Her direct costs due to [facility] name prescription are 1800 USD per 90 days, and she cannot afford the [facility] prescription. I will refill generic form at 90 days to avoid  Frequent changes between different generica.   She is followed by Dr. Humphrey Rolls for chronic :  Iron deficiency anemia, I was able to review her last labs from the cancer center, Dr. Humphrey Rolls. The patient doesn't exercise and her weight loss has been minimal over the last 15 month.   EDS - [place] 6 points. FSS  32 ,  Naps occasionally , falls asleep reading the newspaper. She does not believe she has apnea, but was never tested.   Interval history from 11/20/2015,  [name] [name] is seen here today for a regular visit in regarding to her seizure care and refills - but also to discuss her CPAP, which she hasn't used in many month. She has not been a sleep patient here before, her sleep study was done in [place]. She never has seen the sleep doctor.  CPAP was ordered through another source. She  reported that she became noncompliant because the pillows did hurt her nostrils and irritated the skin, and she would wake up dizzy felt overall she slept better without then with CPAP. Given this case scenario I would offer her a change in interface, or adjustment in settings, but at this time the patient does not want using CPAP again. She needs to get a medical note to [facility] , that they stopped billing her for supplies she doesn't use. The CPAP was not ordered through [facility] but through her PCP, Dr Melford Aase. We dd not order it, and she had no CPAP related visits with [place] before today.    Review of Systems: Out of a complete 14 system review, the patient complains of only the following symptoms, and all other reviewed systems are negative. Seizure free, hearing loss, Menieres.  Social History   Social History  . Marital status: Widowed    Spouse name: N/A  . Number of children: 3  . Years of education: 34   Occupational History  . retired    Social History Main Topics  . Smoking status: Former Smoker    Packs/day: 1.00    Years: 30.00    Types: Cigarettes    Quit date: 06/29/1983  . Smokeless tobacco: Never Used  . Alcohol use 0.0 oz/week     Comment: 09/15/2013 "glass of wine maybe once/year"  . Drug use: No  . Sexual activity: Not Currently   Other Topics Concern  . Not on file   Social History Narrative   Patient lives at home alone and she is widowed.    Retired.   Education some college.   Right handed.   Caffeine sometimes not daily.     Family History  Problem Relation Age of Onset  . Heart disease Mother   . Stroke Mother   . Heart disease Father   . Ovarian cancer Sister   . Hypertension Son   . Hypertension Son   . Diabetes Son   . Alcohol abuse Son     Past Medical History:  Diagnosis Date  . Anemia, unspecified   . Arthritis    "knees; right shoulder" (09/15/2013)  . Basal cell carcinoma    "right upper outer lip"  . DM neuropathy, type II diabetes mellitus ([place])   . Epilepsy ([place])   . GERD (gastroesophageal reflux disease)   . Gout   . High cholesterol   . Hypertension   . Hypothyroidism   . Meniere's disease   . Obesity (BMI 30-39.9)   . Other forms of epilepsy and recurrent seizures without mention of intractable epilepsy 11/04/2012   Non convulsive paroxysmal spells, responding to [facility], patient not driving.   . Pneumonia ~ 1943  . Skin cancer    "forehead; right hand"  . Type II or unspecified type diabetes mellitus without mention of complication, not stated as uncontrolled   . UTI (urinary tract infection)   . Vitamin D deficiency     Past Surgical History:  Procedure Laterality Date  . CERVICAL POLYPECTOMY    . HAMMER TOE SURGERY Left 1980's  . MOHS SURGERY  20087   "right upper outer lip"  . TONSILLECTOMY  ~ 1947  . WRIST SURGERY Left ~ 1950   "ran arm thru window"    Current Outpatient Prescriptions  Medication Sig Dispense Refill  . acetaminophen (TYLENOL) 500 MG tablet Take 1,000 mg by mouth every 6 (six) hours as needed for moderate pain.    Marland Kitchen aspirin 81 MG tablet Take 81 mg by mouth daily.    Marland Kitchen atenolol (TENORMIN) 50 MG tablet Take 1 tablet (50 mg total) by mouth daily. 90 tablet 3  . Biotin 5000 MCG TABS Take 5,000 Units by mouth 2 (two) times daily.     . ferrous sulfate dried (SLOW FE) 160 (50 FE) MG TBCR Take 160 mg by mouth 2 (two) times daily.     . fish oil-omega-3 fatty acids  1000 MG capsule Take 2 g by mouth at bedtime.     . Flaxseed, Linseed, (FLAX SEED OIL) 1000 MG CAPS Take 1 capsule by mouth 3 (three) times daily.    . furosemide (LASIX) 40 MG tablet Take 1 tablet (40 mg total) by mouth daily. 30 tablet 11  . glimepiride (AMARYL) 4 MG tablet take 1 tablet by mouth  once daily WITH BREAKFAST 90 tablet 1  . glucose blood (FREESTYLE INSULINX TEST) test strip TEST once daily FOR BLOOD SUGAR EACH MORNING. DX-E11.9. 100 each 1  . Lancets (FREESTYLE) lancets TEST once daily EACH MORNING. DX-E11.9. 100 each 1  . lansoprazole (PREVACID) 30 MG capsule TAKE 1 BY MOUTH DAILY 90 capsule 4  . levETIRAcetam (KEPPRA) 500 MG tablet TAKE 1 BY MOUTH 3 TIMES DAILY AFTER MEALS 90 tablet 0  . levothyroxine (SYNTHROID, LEVOTHROID) 100 MCG tablet Take 1 tablet (100 mcg total) by mouth daily. 90 tablet 3  . loratadine (CLARITIN) 10 MG tablet Take 10 mg by mouth daily.    . magnesium oxide (MAG-OX) 400 MG tablet Take 400 mg by mouth 3 (three) times daily.     . meclizine (ANTIVERT) 25 MG tablet Take 1 tablet (25 mg total) by mouth daily. 90 tablet 3  . metFORMIN (GLUCOPHAGE-XR) 500 MG 24 hr tablet Take 2 tablets 2 x / day after meals 360 tablet 1  . mupirocin ointment (BACTROBAN) 2 % Apply 1 application topically 2 (two) times daily. 22 g 0  . potassium chloride SA (K-DUR,KLOR-CON) 20 MEQ tablet Take 1 tablet (20 mEq total) by mouth daily. 30 tablet 0  . saxagliptin HCl (ONGLYZA) 5 MG TABS tablet Take 1 tablet daily as directed for Diabetes 90 tablet 3  . vitamin B-12 (CYANOCOBALAMIN) 100 MCG tablet Take 50 mcg by mouth daily.    . vitamin C (ASCORBIC ACID) 500 MG tablet Take 500 mg by mouth daily.     No current facility-administered medications for this visit.     Allergies as of 11/20/2015 - Review Complete 11/20/2015  Allergen Reaction Noted  . Ppd [tuberculin purified protein derivative] Other (See Comments)     Vitals: BP 140/80   Pulse 96   Resp 20   Ht 5\' 2"  (1.575 m)    Wt 213 lb (96.6 kg)   BMI 38.96 kg/m  Last Weight:  Wt Readings from Last 1 Encounters:  11/20/15 213 lb (96.6 kg)   Last Height:   Ht Readings from Last 1 Encounters:  11/20/15 5\' 2"  (1.575 m)    Physical exam:  General: The patient is awake, alert and appears not in acute distress. The patient is well groomed. Head: Normocephalic, atraumatic. Neck is supple. Mallampati 3 , neck circumference: 16 inches, no nasal deviation.  Cardiovascular:  Regular rate and rhythm, without  murmurs or carotid bruit, and without distended neck veins. Respiratory: Lungs are clear to auscultation. Skin:  Without evidence of edema, or rash Trunk: BMI is elevated . This patient  has normal posture.  Neurologic exam : The patient is awake and alert, oriented to place and time.   Memory subjective  described as intact. There is a normal attention span & concentration ability. Speech is fluent without  dysarthria, dysphonia or aphasia. Mood and affect are appropriate.  Cranial nerves: Pupils are equal and briskly reactive to light. Funduscopic exam without  evidence of pallor or edema.  Cataract surgery bilaterally .Extraocular movements  in vertical and horizontal planes intact and without nystagmus. Visual fields by finger perimetry are intact. Hearing corrected by hearing aids.  Facial sensation intact to fine touch. Facial motor strength is symmetric and tongue and uvula move midline.  Motor exam: Normal tone and normal muscle bulk and symmetric normal strength in all extremities. Sensory:  Fine touch, pinprick and vibration were tested in all extremities. Proprioception is tested in the upper extremities and  normal. Coordination:  Rapid alternating movements in the fingers/hands is tested and normal. Normal handwriting.  Finger-to-nose maneuver tested and normal without evidence of ataxia, dysmetria or tremor. Gait and station: Patient walks without assistive deviceStrength within normal limits.  Stance is stable and normal. Wider based. Steps are unfragmented. Deep tendon reflexes: in the upper and lower extremities are symmetric and intact. Babinski maneuver response is  downgoing.   Assessment:  Seizure disorder, refilled Keppra at current dose.  Hearing loss ,related to age and meniere's . CPAP [facility] lab , July 2016 ) non compliant. [facility]  Vertigo resolved on Keppra !   Plan:   See above . Refill

## 2015-12-01 ENCOUNTER — Ambulatory Visit (INDEPENDENT_AMBULATORY_CARE_PROVIDER_SITE_OTHER): Payer: Medicare Other | Admitting: Podiatry

## 2015-12-01 ENCOUNTER — Encounter: Payer: Self-pay | Admitting: Podiatry

## 2015-12-01 VITALS — BP 138/73 | HR 88 | Resp 14

## 2015-12-01 DIAGNOSIS — M79674 Pain in right toe(s): Secondary | ICD-10-CM

## 2015-12-01 DIAGNOSIS — B351 Tinea unguium: Secondary | ICD-10-CM

## 2015-12-01 DIAGNOSIS — L98492 Non-pressure chronic ulcer of skin of other sites with fat layer exposed: Secondary | ICD-10-CM

## 2015-12-01 NOTE — Progress Notes (Signed)
This patient returns to the office for a ulcer on the dorsolateral aspect of the fifth metatarsal right foot. She says she is not having any pain or discomfort. She has been initially taking antibiotics and bandaging the toe. She was seen by Dr. Paulla Dolly who x-rayed and bandaged the site and told her to return to the office in a few weeks. She presents the office today for continued evaluation and treatment of this ulcer. She also relates pain noted in the big toe of the right foot. due to the thickness of the nail   GENERAL APPEARANCE: Alert, conversant. Appropriately groomed. No acute distress.  VASCULAR: Pedal pulses are  palpable at  [facility] and PT bilateral.  Capillary refill time is immediate to all digits,  Normal temperature gradient.   NEUROLOGIC: sensation is normal to 5.07 monofilament at 5/5 sites bilateral.  Light touch is intact bilateral, Muscle strength normal.  MUSCULOSKELETAL: acceptable muscle strength, tone and stability bilateral.  Intrinsic muscluature intact bilateral.  Rectus appearance of foot and digits noted bilateral.   DERMATOLOGIC: skin color, texture, and turgor are within normal limits.  No preulcerative lesions or ulcers  are seen, no interdigital maceration noted.  No open lesions present.  The ulcer measures at 5 mm. X 5 mm. with no evidence of redness swelling drainage or infection. The ulcer is at the dorsomedial exostosis right foot. Healthy granulation tissue noted.. No drainage noted.  Nails  Thick disfigured discolored nail right hallux.   Dx  Diabetic ulcer right foot  Onychomycosis  Right hallux  TX  Debride necrotic tissue around the ulcer.  Neosporin/DSD.  Home instructions for soaks were given.  Debride nails  B/L.    Gardiner Barefoot DPM

## 2015-12-08 ENCOUNTER — Other Ambulatory Visit: Payer: Self-pay | Admitting: *Deleted

## 2015-12-08 MED ORDER — SAXAGLIPTIN HCL 5 MG PO TABS: ORAL_TABLET | ORAL | 3 refills | Status: DC

## 2015-12-18 ENCOUNTER — Encounter: Payer: Self-pay | Admitting: Physician Assistant

## 2015-12-18 ENCOUNTER — Ambulatory Visit (INDEPENDENT_AMBULATORY_CARE_PROVIDER_SITE_OTHER): Payer: Medicare Other | Admitting: Physician Assistant

## 2015-12-18 VITALS — BP 122/70 | HR 83 | Temp 97.5°F | Resp 14 | Ht 62.0 in | Wt 211.0 lb

## 2015-12-18 DIAGNOSIS — L570 Actinic keratosis: Secondary | ICD-10-CM | POA: Diagnosis not present

## 2015-12-18 DIAGNOSIS — N184 Chronic kidney disease, stage 4 (severe): Secondary | ICD-10-CM

## 2015-12-18 DIAGNOSIS — I10 Essential (primary) hypertension: Secondary | ICD-10-CM | POA: Diagnosis not present

## 2015-12-18 DIAGNOSIS — G40909 Epilepsy, unspecified, not intractable, without status epilepticus: Secondary | ICD-10-CM

## 2015-12-18 DIAGNOSIS — E1122 Type 2 diabetes mellitus with diabetic chronic kidney disease: Secondary | ICD-10-CM | POA: Diagnosis not present

## 2015-12-18 DIAGNOSIS — E559 Vitamin D deficiency, unspecified: Secondary | ICD-10-CM

## 2015-12-18 DIAGNOSIS — E782 Mixed hyperlipidemia: Secondary | ICD-10-CM

## 2015-12-18 DIAGNOSIS — L97511 Non-pressure chronic ulcer of other part of right foot limited to breakdown of skin: Secondary | ICD-10-CM | POA: Diagnosis not present

## 2015-12-18 DIAGNOSIS — Z79899 Other long term (current) drug therapy: Secondary | ICD-10-CM | POA: Diagnosis not present

## 2015-12-18 DIAGNOSIS — E1121 Type 2 diabetes mellitus with diabetic nephropathy: Secondary | ICD-10-CM

## 2015-12-18 LAB — HEPATIC FUNCTION PANEL
ALT: 23 U/L (ref 6–29)
AST: 16 U/L (ref 10–35)
Albumin: 4.4 g/dL (ref 3.6–5.1)
Alkaline Phosphatase: 34 U/L (ref 33–130)
Bilirubin, Direct: 0.1 mg/dL (ref <=0.2)
Indirect Bilirubin: 0.2 mg/dL (ref 0.2–1.2)
Total Bilirubin: 0.3 mg/dL (ref 0.2–1.2)
Total Protein: 7 g/dL (ref 6.1–8.1)

## 2015-12-18 LAB — BASIC METABOLIC PANEL WITH GFR
BUN: 36 mg/dL — ABNORMAL HIGH (ref 7–25)
CO2: 21 mmol/L (ref 20–31)
Calcium: 11.3 mg/dL — ABNORMAL HIGH (ref 8.6–10.4)
Chloride: 103 mmol/L (ref 98–110)
Creat: 1.83 mg/dL — ABNORMAL HIGH (ref 0.60–0.88)
GFR, Est African American: 29 mL/min — ABNORMAL LOW (ref >=60)
GFR, Est Non African American: 26 mL/min — ABNORMAL LOW (ref >=60)
Glucose, Bld: 145 mg/dL — ABNORMAL HIGH (ref 65–99)
Potassium: 4.5 mmol/L (ref 3.5–5.3)
Sodium: 139 mmol/L (ref 135–146)

## 2015-12-18 LAB — CBC WITH DIFFERENTIAL/PLATELET
Basophils Absolute: 0 cells/uL (ref 0–200)
Basophils Relative: 0 %
Eosinophils Absolute: 650 cells/uL — ABNORMAL HIGH (ref 15–500)
Eosinophils Relative: 10 %
HCT: 39.1 % (ref 35.0–45.0)
Hemoglobin: 12.7 g/dL (ref 11.7–15.5)
Lymphocytes Relative: 22 %
Lymphs Abs: 1430 cells/uL (ref 850–3900)
MCH: 29.5 pg (ref 27.0–33.0)
MCHC: 32.5 g/dL (ref 32.0–36.0)
MCV: 90.9 fL (ref 80.0–100.0)
MPV: 10.5 fL (ref 7.5–12.5)
Monocytes Absolute: 585 cells/uL (ref 200–950)
Monocytes Relative: 9 %
Neutro Abs: 3835 cells/uL (ref 1500–7800)
Neutrophils Relative %: 59 %
Platelets: 280 10*3/uL (ref 140–400)
RBC: 4.3 MIL/uL (ref 3.80–5.10)
RDW: 16.1 % — ABNORMAL HIGH (ref 11.0–15.0)
WBC: 6.5 10*3/uL (ref 3.8–10.8)

## 2015-12-18 LAB — LIPID PANEL
Cholesterol: 150 mg/dL (ref 125–200)
HDL: 23 mg/dL — ABNORMAL LOW (ref >=46)
LDL Cholesterol: 74 mg/dL (ref <130)
Total CHOL/HDL Ratio: 6.5 Ratio — ABNORMAL HIGH (ref <=5.0)
Triglycerides: 263 mg/dL — ABNORMAL HIGH (ref <150)
VLDL: 53 mg/dL — ABNORMAL HIGH (ref <30)

## 2015-12-18 LAB — TSH: TSH: 1.41 mIU/L

## 2015-12-18 LAB — MAGNESIUM: Magnesium: 2.2 mg/dL (ref 1.5–2.5)

## 2015-12-18 NOTE — Progress Notes (Signed)
Assessment and Plan:  Hypertension -Continue medication, monitor blood pressure at home. Continue DASH diet.  Reminder to go to the ER if any CP, SOB, nausea, dizziness, severe HA, changes vision/speech, left arm numbness and tingling and jaw pain.  Cholesterol -Continue diet and exercise. Check cholesterol.   Diabetes with diabetic chronic kidney disease and with diabetic polyneuropathy -Continue diet and exercise. Check A1C - continue daily feet checks - continue medications the same  Vitamin D Def - check level and continue medications.   Morbid Obesity with co morbidities- long discussion about weight loss, diet, and exercise  Hypothyroidism-check TSH level, continue medications the same, reminded to take on an empty stomach 30-71mins before food.   Seizure Disorder Continue Keppra Check level PRN  Actinic keratosis 3 freeze and thaw technique used on 5 spots, 4 on face and 1 on back of neck Patient tolerated well, understands will turn red.     Continue diet and meds as discussed. Further disposition pending results of labs. Discussed med's effects and SE's.    HPI 82 y.o. female  presents for 3 month follow up  has a past medical history of Anemia, unspecified; Arthritis; Basal cell carcinoma; DM neuropathy, type II diabetes mellitus ([place]); Epilepsy ([place]); GERD (gastroesophageal reflux disease); Gout; High cholesterol; Hypertension; Hypothyroidism; Meniere's disease; Obesity (BMI 30-39.9); Other forms of epilepsy and recurrent seizures without mention of intractable epilepsy (11/04/2012); Pneumonia (~ 1943); Skin cancer; Type II or unspecified type diabetes mellitus without mention of complication, not stated as uncontrolled; UTI (urinary tract infection); and Vitamin D deficiency.  Her blood pressure has been controlled at home, today her BP is BP: 122/70.  She does workout. She denies chest pain, shortness of breath, dizziness.  She is on cholesterol medication, fenofibrate  due to intolerance of statins.  Her cholesterol is at goal. The cholesterol was:   Lab Results  Component Value Date   CHOL 159 08/28/2015   HDL 25 (L) 08/28/2015   LDLCALC 72 08/28/2015   TRIG 310 (H) 08/28/2015   CHOLHDL 6.4 (H) 08/28/2015    She has been working on diet and exercise for diabetes with diabetic chronic kidney disease and with diabetic polyneuropathy, she is on bASA, she is not on ACE/ARB due to kidney function, She takes the MF 4 pills a day every day but depending on her numbers she will take the onglyza and amaryl and denies  hypoglycemia , polydipsia, polyuria and visual disturbances. Last A1C was:  Lab Results  Component Value Date   HGBA1C 6.9 (H) 08/28/2015    Patient is on Vitamin D supplement. VIT D, 25-HYDROXY  Date Value Ref Range Status  01/24/2014 35 30 - 100 ng/mL Final   She is on thyroid medication. Her medication was not changed last visit.   Lab Results  Component Value Date   TSH 1.56 08/28/2015   BMI is Body mass index is 38.59 kg/m., she is working on diet and exercise. Wt Readings from Last 3 Encounters:  12/18/15 211 lb (95.7 kg)  11/20/15 213 lb (96.6 kg)  11/10/15 215 lb (97.5 kg)    Current Medications:  Current Outpatient Prescriptions on File Prior to Visit  Medication Sig Dispense Refill  . acetaminophen (TYLENOL) 500 MG tablet Take 1,000 mg by mouth every 6 (six) hours as needed for moderate pain.    Marland Kitchen aspirin 81 MG tablet Take 81 mg by mouth daily.    Marland Kitchen atenolol (TENORMIN) 50 MG tablet Take 1 tablet (50 mg total) by  mouth daily. 90 tablet 3  . Biotin 5000 MCG TABS Take 5,000 Units by mouth 2 (two) times daily.     . ferrous sulfate dried (SLOW FE) 160 (50 FE) MG TBCR Take 160 mg by mouth 2 (two) times daily.     . fish oil-omega-3 fatty acids 1000 MG capsule Take 2 g by mouth at bedtime.     . Flaxseed, Linseed, (FLAX SEED OIL) 1000 MG CAPS Take 1 capsule by mouth 3 (three) times daily.    . furosemide (LASIX) 40 MG tablet Take  1 tablet (40 mg total) by mouth daily. 30 tablet 11  . glimepiride (AMARYL) 4 MG tablet take 1 tablet by mouth once daily WITH BREAKFAST 90 tablet 1  . glucose blood (FREESTYLE INSULINX TEST) test strip TEST once daily FOR BLOOD SUGAR EACH MORNING. DX-E11.9. 100 each 1  . Lancets (FREESTYLE) lancets TEST once daily EACH MORNING. DX-E11.9. 100 each 1  . lansoprazole (PREVACID) 30 MG capsule TAKE 1 BY MOUTH DAILY 90 capsule 4  . levETIRAcetam (KEPPRA) 500 MG tablet TAKE 1 BY MOUTH 3 TIMES DAILY AFTER MEALS 90 tablet 0  . levothyroxine (SYNTHROID, LEVOTHROID) 100 MCG tablet Take 1 tablet (100 mcg total) by mouth daily. 90 tablet 3  . loratadine (CLARITIN) 10 MG tablet Take 10 mg by mouth daily.    . magnesium oxide (MAG-OX) 400 MG tablet Take 400 mg by mouth 3 (three) times daily.     . meclizine (ANTIVERT) 25 MG tablet Take 1 tablet (25 mg total) by mouth daily. 90 tablet 3  . metFORMIN (GLUCOPHAGE-XR) 500 MG 24 hr tablet Take 2 tablets 2 x / day after meals 360 tablet 1  . potassium chloride SA (K-DUR,KLOR-CON) 20 MEQ tablet Take 1 tablet (20 mEq total) by mouth daily. 30 tablet 0  . saxagliptin HCl (ONGLYZA) 5 MG TABS tablet Take 1 tablet daily as directed for Diabetes 90 tablet 3  . vitamin B-12 (CYANOCOBALAMIN) 100 MCG tablet Take 50 mcg by mouth daily.    . vitamin C (ASCORBIC ACID) 500 MG tablet Take 500 mg by mouth daily.     No current facility-administered medications on file prior to visit.    Medical History:  Past Medical History:  Diagnosis Date  . Anemia, unspecified   . Arthritis    "knees; right shoulder" (09/15/2013)  . Basal cell carcinoma    "right upper outer lip"  . DM neuropathy, type II diabetes mellitus ([place])   . Epilepsy ([place])   . GERD (gastroesophageal reflux disease)   . Gout   . High cholesterol   . Hypertension   . Hypothyroidism   . Meniere's disease   . Obesity (BMI 30-39.9)   . Other forms of epilepsy and recurrent seizures without mention of  intractable epilepsy 11/04/2012   Non convulsive paroxysmal spells, responding to [facility], patient not driving.   . Pneumonia ~ 1943  . Skin cancer    "forehead; right hand"  . Type II or unspecified type diabetes mellitus without mention of complication, not stated as uncontrolled   . UTI (urinary tract infection)   . Vitamin D deficiency    Allergies:  Allergies  Allergen Reactions  . Ppd [Tuberculin Purified Protein Derivative] Other (See Comments)    indurated     Review of Systems:  Review of Systems  Constitutional: Negative.   Respiratory: Negative.   Cardiovascular: Negative.   Gastrointestinal: Negative.   Genitourinary: Negative for dysuria, flank pain, frequency, hematuria and urgency.  Musculoskeletal: Positive for myalgias. Negative for back pain, falls, joint pain and neck pain.  Skin: Negative.   Neurological: Negative.   Psychiatric/Behavioral: Negative.     Family history- Review and unchanged Social history- Review and unchanged Physical Exam: BP 122/70   Pulse 83   Temp 97.5 F (36.4 C)   Resp 14   Ht 5\' 2"  (1.575 m)   Wt 211 lb (95.7 kg)   SpO2 97%   BMI 38.59 kg/m  Wt Readings from Last 3 Encounters:  12/18/15 211 lb (95.7 kg)  11/20/15 213 lb (96.6 kg)  11/10/15 215 lb (97.5 kg)   General Appearance: Well nourished, in no apparent distress. Eyes: PERRLA, EOMs, conjunctiva no swelling or erythema Sinuses: No Frontal/maxillary tenderness ENT/Mouth: Ext aud canals clear, TMs without erythema, bulging. No erythema, swelling, or exudate on post pharynx.  Tonsils not swollen or erythematous. Hearing normal.  Neck: Supple, thyroid normal.  Respiratory: Respiratory effort normal, BS equal bilaterally without rales, rhonchi, wheezing or stridor.  Cardio: RRR with no MRGs. Brisk peripheral pulses without edema.  Abdomen: Soft, + BS.  Non tender, no guarding, rebound, hernias, masses. Lymphatics: Non tender without lymphadenopathy.  Musculoskeletal:  Full ROM, 5/5 strength, Normal gait Skin: Several erythematous scaly patches on face and neck. Warm, dry without rashes, lesions, ecchymosis.  Neuro: Cranial nerves intact. No cerebellar symptoms.  Psych: Awake and oriented X 3, normal affect, Insight and Judgment appropriate.    Vicie Mutters, PA-C 8:57 AM [facility]

## 2015-12-19 LAB — HEMOGLOBIN A1C
Hgb A1c MFr Bld: 6.2 % — ABNORMAL HIGH (ref <5.7)
Mean Plasma Glucose: 131 mg/dL

## 2015-12-19 LAB — VITAMIN D 25 HYDROXY (VIT D DEFICIENCY, FRACTURES): Vit D, 25-Hydroxy: 82 ng/mL (ref 30–100)

## 2015-12-25 ENCOUNTER — Encounter: Payer: Self-pay | Admitting: Podiatry

## 2015-12-25 ENCOUNTER — Ambulatory Visit (INDEPENDENT_AMBULATORY_CARE_PROVIDER_SITE_OTHER): Payer: Medicare Other | Admitting: Podiatry

## 2015-12-25 VITALS — BP 137/74 | HR 87 | Resp 16

## 2015-12-25 DIAGNOSIS — L98492 Non-pressure chronic ulcer of skin of other sites with fat layer exposed: Secondary | ICD-10-CM

## 2015-12-25 DIAGNOSIS — M79674 Pain in right toe(s): Secondary | ICD-10-CM | POA: Diagnosis not present

## 2015-12-25 DIAGNOSIS — B351 Tinea unguium: Secondary | ICD-10-CM

## 2015-12-25 DIAGNOSIS — E1140 Type 2 diabetes mellitus with diabetic neuropathy, unspecified: Secondary | ICD-10-CM | POA: Diagnosis not present

## 2015-12-25 DIAGNOSIS — E1149 Type 2 diabetes mellitus with other diabetic neurological complication: Secondary | ICD-10-CM | POA: Diagnosis not present

## 2015-12-25 NOTE — Progress Notes (Signed)
Subjective:     Patient ID: [name] [name], female   DOB: March 01, 1932, 82 y.o.   MRN: [id number]  HPI patient points to the lateral side right fifth metatarsal stating I think it's doing much better but he wanted to get it checked   Review of Systems     Objective:   Physical Exam Neurovascular status unchanged with patient having a small lesion right fifth metatarsal with no drainage noted no edema erythema and mild crusted tissue surrounding the area    Assessment:     's very small superficial ulceration right fifth metatarsal with no drainage or indications of infective process    Plan:     Debrided tissue instructed on continued soaks padding and if any redness drainage or swelling were to occur she is to let us know immediately or if any systemic signs of infection were to occur she needs to go straight to the emergency room and could possibly need IV antibiotics

## 2016-01-09 ENCOUNTER — Encounter: Payer: Self-pay | Admitting: Internal Medicine

## 2016-01-17 ENCOUNTER — Other Ambulatory Visit: Payer: Self-pay | Admitting: Internal Medicine

## 2016-01-17 DIAGNOSIS — E119 Type 2 diabetes mellitus without complications: Secondary | ICD-10-CM

## 2016-01-17 MED ORDER — GLIMEPIRIDE 4 MG PO TABS: ORAL_TABLET | ORAL | 1 refills | Status: DC

## 2016-01-20 ENCOUNTER — Encounter: Payer: Self-pay | Admitting: *Deleted

## 2016-01-22 ENCOUNTER — Encounter: Payer: Self-pay | Admitting: Physician Assistant

## 2016-01-22 ENCOUNTER — Encounter: Payer: Self-pay | Admitting: Internal Medicine

## 2016-01-22 ENCOUNTER — Ambulatory Visit (INDEPENDENT_AMBULATORY_CARE_PROVIDER_SITE_OTHER): Payer: Medicare Other | Admitting: Physician Assistant

## 2016-01-22 VITALS — BP 134/76 | HR 90 | Temp 97.3°F | Resp 16 | Wt 214.2 lb

## 2016-01-22 DIAGNOSIS — N183 Chronic kidney disease, stage 3 unspecified: Secondary | ICD-10-CM

## 2016-01-22 DIAGNOSIS — E11621 Type 2 diabetes mellitus with foot ulcer: Secondary | ICD-10-CM

## 2016-01-22 DIAGNOSIS — I872 Venous insufficiency (chronic) (peripheral): Secondary | ICD-10-CM | POA: Diagnosis not present

## 2016-01-22 DIAGNOSIS — E1122 Type 2 diabetes mellitus with diabetic chronic kidney disease: Secondary | ICD-10-CM

## 2016-01-22 DIAGNOSIS — L97519 Non-pressure chronic ulcer of other part of right foot with unspecified severity: Secondary | ICD-10-CM | POA: Diagnosis not present

## 2016-01-22 DIAGNOSIS — L97509 Non-pressure chronic ulcer of other part of unspecified foot with unspecified severity: Secondary | ICD-10-CM

## 2016-01-22 NOTE — Progress Notes (Signed)
Assessment and Plan:  Diabetic ulcer of toe of right foot associated with type 2 diabetes mellitus, unspecified ulcer stage (HCC) Continue to follow up Dr. Paulla Dolly as needed Continue to monitor sugars daily and monitor feet daily Patient needs special shoes because of her diabetes and personal history of an ulcer, she would benefit from them Continue medications    HPI 82 y.o.female with history of DM 2, venous insufficiency, morbid obesity, cholesterol presents for evaluation for DM shoes. She has been following with Dr. Paulla Dolly for diabetic foot ulcer on her right foot, she also has right and left hallux and oncyomycosis, she goes to the podiatrist for nail debridement and monitoring. . Due to this she would benefit form DM shoes in the future. She has done a good job with decreasing her sugars.  Lab Results  Component Value Date   HGBA1C 6.2 (H) 12/18/2015    Past Medical History:  Diagnosis Date  . Anemia, unspecified   . Arthritis    "knees; right shoulder" (09/15/2013)  . Basal cell carcinoma    "right upper outer lip"  . DM neuropathy, type II diabetes mellitus ([place])   . Epilepsy ([place])   . GERD (gastroesophageal reflux disease)   . Gout   . High cholesterol   . Hypertension   . Hypothyroidism   . Meniere's disease   . Obesity (BMI 30-39.9)   . Other forms of epilepsy and recurrent seizures without mention of intractable epilepsy 11/04/2012   Non convulsive paroxysmal spells, responding to [facility], patient not driving.   . Pneumonia ~ 1943  . Skin cancer    "forehead; right hand"  . Type II or unspecified type diabetes mellitus without mention of complication, not stated as uncontrolled   . UTI (urinary tract infection)   . Vitamin D deficiency      Allergies  Allergen Reactions  . Ppd [Tuberculin Purified Protein Derivative] Other (See Comments)    indurated      Current Outpatient Prescriptions on File Prior to Visit  Medication Sig Dispense Refill  . acetaminophen  (TYLENOL) 500 MG tablet Take 1,000 mg by mouth every 6 (six) hours as needed for moderate pain.    Marland Kitchen aspirin 81 MG tablet Take 81 mg by mouth daily.    Marland Kitchen atenolol (TENORMIN) 50 MG tablet Take 1 tablet (50 mg total) by mouth daily. 90 tablet 3  . Biotin 5000 MCG TABS Take 5,000 Units by mouth 2 (two) times daily.     . ferrous sulfate dried (SLOW FE) 160 (50 FE) MG TBCR Take 160 mg by mouth 2 (two) times daily.     . fish oil-omega-3 fatty acids 1000 MG capsule Take 2 g by mouth at bedtime.     . Flaxseed, Linseed, (FLAX SEED OIL) 1000 MG CAPS Take 1 capsule by mouth 3 (three) times daily.    . furosemide (LASIX) 40 MG tablet Take 1 tablet (40 mg total) by mouth daily. 30 tablet 11  . glimepiride (AMARYL) 4 MG tablet take 1 tablet by mouth once daily WITH BREAKFAST 90 tablet 1  . glucose blood (FREESTYLE INSULINX TEST) test strip TEST once daily FOR BLOOD SUGAR EACH MORNING. DX-E11.9. 100 each 1  . Lancets (FREESTYLE) lancets TEST once daily EACH MORNING. DX-E11.9. 100 each 1  . lansoprazole (PREVACID) 30 MG capsule TAKE 1 BY MOUTH DAILY 90 capsule 4  . levETIRAcetam (KEPPRA) 500 MG tablet TAKE 1 BY MOUTH 3 TIMES DAILY AFTER MEALS 90 tablet 0  . levothyroxine (  SYNTHROID, LEVOTHROID) 100 MCG tablet Take 1 tablet (100 mcg total) by mouth daily. 90 tablet 3  . loratadine (CLARITIN) 10 MG tablet Take 10 mg by mouth daily.    . magnesium oxide (MAG-OX) 400 MG tablet Take 400 mg by mouth 3 (three) times daily.     . meclizine (ANTIVERT) 25 MG tablet Take 1 tablet (25 mg total) by mouth daily. 90 tablet 3  . metFORMIN (GLUCOPHAGE-XR) 500 MG 24 hr tablet Take 2 tablets 2 x / day after meals 360 tablet 1  . potassium chloride SA (K-DUR,KLOR-CON) 20 MEQ tablet Take 1 tablet (20 mEq total) by mouth daily. 30 tablet 0  . saxagliptin HCl (ONGLYZA) 5 MG TABS tablet Take 1 tablet daily as directed for Diabetes 90 tablet 3  . vitamin B-12 (CYANOCOBALAMIN) 100 MCG tablet Take 50 mcg by mouth daily.    . vitamin C  (ASCORBIC ACID) 500 MG tablet Take 500 mg by mouth daily.     No current facility-administered medications on file prior to visit.     ROS: all negative except above.   Physical Exam: Filed Weights   01/22/16 1128  Weight: 214 lb 3.2 oz (97.2 kg)   BP 134/76   Pulse 90   Temp 97.3 F (36.3 C)   Resp 16   Wt 214 lb 3.2 oz (97.2 kg)   SpO2 95%   BMI 39.18 kg/m  General Appearance: Well nourished, obese, in no apparent distress. Respiratory: Respiratory effort normal, BS equal bilaterally without rales, rhonchi, wheezing or stridor.  Cardio: RRR with no MRGs. Brisk peripheral pulses without edema.  Abdomen: Soft, + BS.  Non tender, no guarding, rebound, hernias, masses. Feet: bilateral feet with hallux valgus, right lateral 5th metatarsal with healing DM ulcer, mild drainage but no warmth, redness, pain. Monofilament test normal, palpable DP/PT bilaterally with good cap refill.   Vicie Mutters, PA-C 11:47 AM [facility]

## 2016-02-02 ENCOUNTER — Ambulatory Visit (INDEPENDENT_AMBULATORY_CARE_PROVIDER_SITE_OTHER): Payer: Medicare Other | Admitting: Podiatry

## 2016-02-02 ENCOUNTER — Encounter: Payer: Self-pay | Admitting: Podiatry

## 2016-02-02 VITALS — Temp 98.7°F | Resp 16 | Wt 214.0 lb

## 2016-02-02 DIAGNOSIS — L03119 Cellulitis of unspecified part of limb: Secondary | ICD-10-CM

## 2016-02-02 DIAGNOSIS — L98492 Non-pressure chronic ulcer of skin of other sites with fat layer exposed: Secondary | ICD-10-CM | POA: Diagnosis not present

## 2016-02-02 DIAGNOSIS — L02619 Cutaneous abscess of unspecified foot: Secondary | ICD-10-CM | POA: Diagnosis not present

## 2016-02-02 MED ORDER — DOXYCYCLINE HYCLATE 100 MG PO TABS: 100.0000 mg | ORAL_TABLET | Freq: Two times a day (BID) | ORAL | 0 refills | Status: DC

## 2016-02-02 NOTE — Addendum Note (Signed)
Addended byDeidre Ala, Jeriyah Granlund L on: 02/02/2016 12:25 PM   Modules accepted: Orders

## 2016-02-02 NOTE — Progress Notes (Signed)
This patient returns to the office for a ulcer on the dorsolateral aspect of the fifth metatarsal right foot. She says she is throbbing pain noted on the top of her right foot as well as through the outside 3 toes of the right foot. The top of her right foot has become red, swollen and painful. There was no streaking up the leg. She presents the office today wearing her surgical shoe, which was previously dispensed. She believes this originally happened through the application of acid to her right foot when she was getting a pedicure. She was treated successfully with doxycycline by the previous Dr. and was seen by myself and the area had healed nicely. She now presents saying that the ulcer has opened and she believes she is having an infection at this time   GENERAL APPEARANCE: Alert, conversant. Appropriately groomed. No acute distress.  VASCULAR: Pedal pulses are  palpable at  [facility] and PT bilateral.  Capillary refill time is immediate to all digits,  Normal temperature gradient.   NEUROLOGIC: sensation is normal to 5.07 monofilament at 5/5 sites bilateral.  Light touch is intact bilateral, Muscle strength normal.  MUSCULOSKELETAL: acceptable muscle strength, tone and stability bilateral.  Intrinsic muscluature intact bilateral.  Rectus appearance of foot and digits noted bilateral.   DERMATOLOGIC: Red, swollen, infected right forefoot. On the lateral aspect of the right foot. There is a dorsolateral ulcer noted at the level of the fifth MPJ right foot. There is necrotic tissue noted in the ulcer as well as pus is identified  Nails  Thick disfigured discolored nail right hallux.   Dx  Diabetic ulcer right foot  Cellulitis right foot  TX  Debride necrotic tissue around the ulcer.  Neosporin/DSD.  Home instructions for soaks were given.  Patient was given a prescription for doxycycline. #20 with instructions to take one twice a day for 10 days. She is to perform home soaks and applied bandages to the  ulcer on the outside of the right foot. A culture and sensitivity was taken to determine the causative bacteria. She is to return the office in one week for further evaluation and treatment.  If this condition worsens or becomes very painful, the patient was told to contact this office or go to the Emergency Department at the hospital.  Gardiner Barefoot DPM    Gardiner Barefoot DPM

## 2016-02-15 ENCOUNTER — Other Ambulatory Visit: Payer: Self-pay | Admitting: *Deleted

## 2016-02-15 ENCOUNTER — Telehealth: Payer: Self-pay | Admitting: *Deleted

## 2016-02-15 MED ORDER — LEVOTHYROXINE SODIUM 100 MCG PO TABS: 100.0000 ug | ORAL_TABLET | Freq: Every day | ORAL | 3 refills | Status: DC

## 2016-02-15 NOTE — Telephone Encounter (Signed)
Dr. Prudence Davidson called for Wound culture results to be present for 02/16/2016 appt. [place] - [facility] states report is final and should reach my fax in 15 minutes.

## 2016-02-16 ENCOUNTER — Ambulatory Visit (INDEPENDENT_AMBULATORY_CARE_PROVIDER_SITE_OTHER): Payer: Medicare Other | Admitting: Podiatry

## 2016-02-16 ENCOUNTER — Encounter: Payer: Self-pay | Admitting: Podiatry

## 2016-02-16 VITALS — BP 141/70 | HR 89 | Resp 18

## 2016-02-16 DIAGNOSIS — L98492 Non-pressure chronic ulcer of skin of other sites with fat layer exposed: Secondary | ICD-10-CM | POA: Diagnosis not present

## 2016-02-16 DIAGNOSIS — L02619 Cutaneous abscess of unspecified foot: Secondary | ICD-10-CM

## 2016-02-16 DIAGNOSIS — E1140 Type 2 diabetes mellitus with diabetic neuropathy, unspecified: Secondary | ICD-10-CM

## 2016-02-16 DIAGNOSIS — L03119 Cellulitis of unspecified part of limb: Secondary | ICD-10-CM | POA: Diagnosis not present

## 2016-02-16 DIAGNOSIS — E1149 Type 2 diabetes mellitus with other diabetic neurological complication: Secondary | ICD-10-CM | POA: Diagnosis not present

## 2016-02-16 MED ORDER — DOXYCYCLINE HYCLATE 100 MG PO TABS: 100.0000 mg | ORAL_TABLET | Freq: Two times a day (BID) | ORAL | 0 refills | Status: DC

## 2016-02-16 NOTE — Progress Notes (Signed)
This patient returns to the office for a ulcer on the dorsolateral aspect 5th MPJ right foot.  She was treated with home soaks and doxycycline for the last week.  She says her foot is whole lot better.  She believes her skin changes were caused by vaseline or reaction to tape.  She has developed a new skin lesion but no pain or and minimal drainage is noted. She returns for contibnued evaluation. C & S revealed strep infection.   GENERAL APPEARANCE: Alert, conversant. Appropriately groomed. No acute distress.  VASCULAR: Pedal pulses are  palpable at  [facility] and PT bilateral.  Capillary refill time is immediate to all digits,  Normal temperature gradient.   NEUROLOGIC: sensation is normal to 5.07 monofilament at 5/5 sites bilateral.  Light touch is intact bilateral, Muscle strength normal.  MUSCULOSKELETAL: acceptable muscle strength, tone and stability bilateral.  Intrinsic muscluature intact bilateral.  Rectus appearance of foot and digits noted bilateral.   DERMATOLOGIC: Red, swollen area noted on the dorsum of right foot at level of fifth metatarsal.  The ulcer is now covered with hyperkeratotic tissue.  No drainage is noted.    Nails  Thick disfigured discolored nail right hallux.   Dx  Diabetic ulcer right foot.  Resolving  c ellulitis right foot  TX  Debride necrotic tissue around the ulcer.  Neosporin/DSD.  Home instructions for soaks were given.  Patient was given a prescription for doxycycline. #20 with instructions to take one twice a day for 10 days. She is to perform home soaks and applied bandages to the ulcer on the outside of the right foot  RTC prn  .  Gardiner Barefoot DPM    Gardiner Barefoot DPM

## 2016-02-27 ENCOUNTER — Other Ambulatory Visit: Payer: Self-pay | Admitting: Internal Medicine

## 2016-03-05 ENCOUNTER — Ambulatory Visit (INDEPENDENT_AMBULATORY_CARE_PROVIDER_SITE_OTHER): Payer: Medicare Other | Admitting: Podiatry

## 2016-03-05 DIAGNOSIS — L98492 Non-pressure chronic ulcer of skin of other sites with fat layer exposed: Secondary | ICD-10-CM | POA: Diagnosis not present

## 2016-03-05 DIAGNOSIS — L03119 Cellulitis of unspecified part of limb: Secondary | ICD-10-CM

## 2016-03-05 DIAGNOSIS — L02619 Cutaneous abscess of unspecified foot: Secondary | ICD-10-CM | POA: Diagnosis not present

## 2016-03-05 DIAGNOSIS — M79674 Pain in right toe(s): Secondary | ICD-10-CM

## 2016-03-05 DIAGNOSIS — B351 Tinea unguium: Secondary | ICD-10-CM

## 2016-03-05 NOTE — Progress Notes (Signed)
      This patient returns to the office for a ulcer on the dorsolateral aspect 5th MPJ right foot.  She was treated with home soaks and doxycycline for the last week.  She says her foot is whole lot better.     She returns for continued  evaluation. Of her infected right foot.  She also returns for nail debridement..   GENERAL APPEARANCE: Alert, conversant. Appropriately groomed. No acute distress.  VASCULAR: Pedal pulses are  palpable at  [name] and PT bilateral.  Capillary refill time is immediate to all digits,  Normal temperature gradient.   NEUROLOGIC: sensation is normal to 5.07 monofilament at 5/5 sites bilateral.  Light touch is intact bilateral, Muscle strength normal.  MUSCULOSKELETAL: acceptable muscle strength, tone and stability bilateral.  Intrinsic muscluature intact bilateral.  Rectus appearance of foot and digits noted bilateral.   DERMATOLOGIC  Her right foot has a pinhole ulcer at the dorsolateral aspect 5th MPJ right foot.  There is continued discoloration and swelling dorsally at the site of the previous ulcer.  No palpable pain noted.  No drainage is noted.    Nails  Thick disfigured discolored nail right hallux.   Dx  Diabetic ulcer right foot.  Resolving  cellulitis right foot.  Onychomycosis  TX  Examination of right foot.  Neosporin/DSD.  Home instructions for soaks were given.  She is to perform home soaks and applied bandages to the ulcer on the outside of the right foot until the ulcer has closed.  Debridement of nails.. RTC prn  .  Gardiner Barefoot DPM

## 2016-03-19 ENCOUNTER — Other Ambulatory Visit: Payer: Self-pay | Admitting: *Deleted

## 2016-03-19 MED ORDER — LEVETIRACETAM 500 MG PO TABS: ORAL_TABLET | ORAL | 1 refills | Status: DC

## 2016-03-25 ENCOUNTER — Ambulatory Visit (INDEPENDENT_AMBULATORY_CARE_PROVIDER_SITE_OTHER): Payer: Medicare Other | Admitting: Physician Assistant

## 2016-03-25 ENCOUNTER — Encounter: Payer: Self-pay | Admitting: Physician Assistant

## 2016-03-25 ENCOUNTER — Other Ambulatory Visit: Payer: Self-pay

## 2016-03-25 VITALS — BP 126/84 | HR 82 | Temp 97.3°F | Resp 14 | Ht 62.0 in | Wt 207.6 lb

## 2016-03-25 DIAGNOSIS — E1122 Type 2 diabetes mellitus with diabetic chronic kidney disease: Secondary | ICD-10-CM

## 2016-03-25 DIAGNOSIS — E1149 Type 2 diabetes mellitus with other diabetic neurological complication: Secondary | ICD-10-CM

## 2016-03-25 DIAGNOSIS — E782 Mixed hyperlipidemia: Secondary | ICD-10-CM

## 2016-03-25 DIAGNOSIS — Z79899 Other long term (current) drug therapy: Secondary | ICD-10-CM | POA: Diagnosis not present

## 2016-03-25 DIAGNOSIS — N183 Chronic kidney disease, stage 3 (moderate): Secondary | ICD-10-CM | POA: Diagnosis not present

## 2016-03-25 DIAGNOSIS — G40909 Epilepsy, unspecified, not intractable, without status epilepticus: Secondary | ICD-10-CM | POA: Diagnosis not present

## 2016-03-25 DIAGNOSIS — E039 Hypothyroidism, unspecified: Secondary | ICD-10-CM | POA: Diagnosis not present

## 2016-03-25 DIAGNOSIS — L97519 Non-pressure chronic ulcer of other part of right foot with unspecified severity: Secondary | ICD-10-CM | POA: Diagnosis not present

## 2016-03-25 DIAGNOSIS — E559 Vitamin D deficiency, unspecified: Secondary | ICD-10-CM

## 2016-03-25 DIAGNOSIS — I10 Essential (primary) hypertension: Secondary | ICD-10-CM

## 2016-03-25 DIAGNOSIS — E11621 Type 2 diabetes mellitus with foot ulcer: Secondary | ICD-10-CM

## 2016-03-25 LAB — CBC WITH DIFFERENTIAL/PLATELET
Basophils Absolute: 60 cells/uL (ref 0–200)
Basophils Relative: 1 %
Eosinophils Absolute: 780 cells/uL — ABNORMAL HIGH (ref 15–500)
Eosinophils Relative: 13 %
HCT: 38.6 % (ref 35.0–45.0)
Hemoglobin: 12.4 g/dL (ref 11.7–15.5)
Lymphocytes Relative: 21 %
Lymphs Abs: 1260 cells/uL (ref 850–3900)
MCH: 29.4 pg (ref 27.0–33.0)
MCHC: 32.1 g/dL (ref 32.0–36.0)
MCV: 91.5 fL (ref 80.0–100.0)
MPV: 10.5 fL (ref 7.5–12.5)
Monocytes Absolute: 480 cells/uL (ref 200–950)
Monocytes Relative: 8 %
Neutro Abs: 3420 cells/uL (ref 1500–7800)
Neutrophils Relative %: 57 %
Platelets: 286 10*3/uL (ref 140–400)
RBC: 4.22 MIL/uL (ref 3.80–5.10)
RDW: 16.1 % — ABNORMAL HIGH (ref 11.0–15.0)
WBC: 6 10*3/uL (ref 3.8–10.8)

## 2016-03-25 LAB — TSH: TSH: 1.09 mIU/L

## 2016-03-25 MED ORDER — METFORMIN HCL ER 500 MG PO TB24
ORAL_TABLET | ORAL | 1 refills | Status: DC
Start: 2016-03-25 — End: 2016-08-28

## 2016-03-25 NOTE — Progress Notes (Signed)
3 MONTH FOLLOW UP  Assessment:    Essential hypertension - continue medications, DASH diet, exercise and monitor at home. Call if greater than 130/80.  -cont meds -monitor at home -DASH diet - TSH  Hypothyroidism, unspecified hypothyroidism type Hypothyroidism-check TSH level, continue medications the same, reminded to take on an empty stomach 30-86mins before food.  -cont levothyroxine - TSH   Type 2 diabetes mellitus with diabetic nephropathy, without long-term current use of insulin (HCC) -cont diet and exercise - Hemoglobin A1c - recent addition of lasix will check kidney function   Type II diabetes mellitus with neurological manifestations (HCC) -cont diet and exercise - Hemoglobin A1c   Hyperlipidemia -cont diet and exercise - Lipid panel   Medication management - CBC with Differential/Platelet - BASIC METABOLIC PANEL WITH GFR - Hepatic function panel  Seizure disorder (HCC) -cont keppra - Levetiracetam level  Morbid obesity, unspecified obesity type (HCC) -cont diet and exercise  Diabetic foot ulcer Continue to follow up Dr. Paulla Dolly, daily feet checks, continue to decrease sugars  Over 30 minutes of exam, counseling, chart review, and critical decision making was performed  Subjective:   [name] [name] is a 83 y.o. female who presents for 3 month follow up on hypertension, prediabetes, hyperlipidemia, vitamin D def.   Her blood pressure has been controlled at home, today their BP is BP: 126/84 She does workout. She denies chest pain, shortness of breath, dizziness.  She is on cholesterol medication and denies myalgias. Her cholesterol is at goal. The cholesterol last visit was:   Lab Results  Component Value Date   CHOL 150 12/18/2015   HDL 23 (L) 12/18/2015   LDLCALC 74 12/18/2015   TRIG 263 (H) 12/18/2015   CHOLHDL 6.5 (H) 12/18/2015   She has been working on diet and exercise for diabetes with CKD, neuropathy, and recent DM ulcer that is being  following by Dr. Paulla Dolly, she is on ASA, she is on ARB, highest she has seen her sugars is 150, and denies hypoglycemia , increased appetite, nausea, polydipsia and visual disturbances. Last A1C in the office was:  Lab Results  Component Value Date   HGBA1C 6.2 (H) 12/18/2015   Last GFR Lab Results  Component Value Date   GFRAA 29 (L) 12/18/2015   Patient is on Vitamin D supplement. Lab Results  Component Value Date   VD25OH 82 12/18/2015     BMI is Body mass index is 37.97 kg/m., she is working on diet and exercise. Wt Readings from Last 3 Encounters:  03/25/16 207 lb 9.6 oz (94.2 kg)  02/02/16 214 lb (97.1 kg)  01/22/16 214 lb 3.2 oz (97.2 kg)   She is on thyroid medication. Her medication was not changed last visit.   Lab Results  Component Value Date   TSH 1.41 12/18/2015  .   Medication Review Current Outpatient Prescriptions on File Prior to Visit  Medication Sig Dispense Refill  . acetaminophen (TYLENOL) 500 MG tablet Take 1,000 mg by mouth every 6 (six) hours as needed for moderate pain.    Marland Kitchen aspirin 81 MG tablet Take 81 mg by mouth daily.    Marland Kitchen atenolol (TENORMIN) 50 MG tablet TAKE 1 TABLET BY MOUTH DAILY 90 tablet 1  . Biotin 5000 MCG TABS Take 5,000 Units by mouth 2 (two) times daily.     . ferrous sulfate dried (SLOW FE) 160 (50 FE) MG TBCR Take 160 mg by mouth 2 (two) times daily.     . fish oil-omega-3  fatty acids 1000 MG capsule Take 2 g by mouth at bedtime.     . Flaxseed, Linseed, (FLAX SEED OIL) 1000 MG CAPS Take 1 capsule by mouth 3 (three) times daily.    . furosemide (LASIX) 40 MG tablet Take 1 tablet (40 mg total) by mouth daily. 30 tablet 11  . glimepiride (AMARYL) 4 MG tablet take 1 tablet by mouth once daily WITH BREAKFAST 90 tablet 1  . glucose blood (FREESTYLE INSULINX TEST) test strip TEST once daily FOR BLOOD SUGAR EACH MORNING. DX-E11.9. 100 each 1  . Lancets (FREESTYLE) lancets TEST once daily EACH MORNING. DX-E11.9. 100 each 1  . lansoprazole  (PREVACID) 30 MG capsule TAKE 1 BY MOUTH DAILY 90 capsule 4  . levETIRAcetam (KEPPRA) 500 MG tablet TAKE 1 BY MOUTH 3 TIMES DAILY AFTER MEALS 90 tablet 1  . levothyroxine (SYNTHROID, LEVOTHROID) 100 MCG tablet Take 1 tablet (100 mcg total) by mouth daily. 90 tablet 3  . loratadine (CLARITIN) 10 MG tablet Take 10 mg by mouth daily.    . magnesium oxide (MAG-OX) 400 MG tablet Take 400 mg by mouth 3 (three) times daily.     . meclizine (ANTIVERT) 25 MG tablet Take 1 tablet (25 mg total) by mouth daily. 90 tablet 3  . potassium chloride SA (K-DUR,KLOR-CON) 20 MEQ tablet Take 1 tablet (20 mEq total) by mouth daily. 30 tablet 0  . saxagliptin HCl (ONGLYZA) 5 MG TABS tablet Take 1 tablet daily as directed for Diabetes 90 tablet 3  . vitamin B-12 (CYANOCOBALAMIN) 100 MCG tablet Take 50 mcg by mouth daily.    . vitamin C (ASCORBIC ACID) 500 MG tablet Take 500 mg by mouth daily.     No current facility-administered medications on file prior to visit.     Current Problems (verified) Patient Active Problem List   Diagnosis Date Noted  . Diabetic foot ulcer ([place]) 01/22/2016  . Vertigo of central origin 11/20/2015  . Encounter for medication refill 11/20/2015  . Meniere disorder, bilateral 11/20/2015  . BMI 39.86,   adult 12/12/2014  . Morbid obesity (BMI 39.86)  ([place]) 12/12/2014  . Medicare annual wellness visit, initial 12/12/2014  . OSA on CPAP 08/08/2014  . Type II diabetes mellitus with neurological manifestations ([place]) 09/15/2013  . Hyperlipidemia 06/27/2013  . Vitamin D deficiency 06/27/2013  . Medication management 06/27/2013  . Seizure disorder ([place]) 11/04/2012  . Iron deficiency anemia 06/13/2008  . COLONIC POLYPS 06/09/2008  . Hypothyroidism 06/09/2008  . T2_NIDDM w/Stage 4 CKD (GFR 29 ml/min) 06/09/2008  . Venous (peripheral) insufficiency 06/09/2008  . GERD w/Hx of Esophageal Stricture 06/09/2008  . Essential hypertension 06/09/2008   Allergies Allergies  Allergen Reactions  .  Ppd [Tuberculin Purified Protein Derivative] Other (See Comments)    indurated   Review of Systems  Constitutional: Negative.   Respiratory: Negative.   Cardiovascular: Negative.   Gastrointestinal: Negative.   Genitourinary: Negative for dysuria, flank pain, frequency, hematuria and urgency.  Musculoskeletal: Positive for myalgias. Negative for back pain, falls, joint pain and neck pain.  Skin: Negative.   Neurological: Negative.   Psychiatric/Behavioral: Negative.      Objective:   Today's Vitals   03/25/16 0842  BP: 126/84  Pulse: 82  Resp: 14  Temp: 97.3 F (36.3 C)  SpO2: 98%  Weight: 207 lb 9.6 oz (94.2 kg)  Height: 5\' 2"  (1.575 m)  PainSc: 0-No pain   Body mass index is 37.97 kg/m.  General appearance: alert, no distress, WD/WN,  female  HEENT: normocephalic, sclerae anicteric, TMs pearly, nares patent, no discharge or erythema, pharynx normal Oral cavity: MMM, no lesions Neck: supple, no lymphadenopathy, no thyromegaly, no masses Heart: RRR with systolic murmur, normal S1, S2 Lungs: CTA bilaterally, no wheezes, rhonchi, or rales Abdomen: +bs, soft, non tender, non distended, no masses, no hepatomegaly, no splenomegaly Musculoskeletal: nontender, no swelling, no obvious deformity Extremities: no edema, wearing compression socks, no cyanosis, no clubbing Pulses: 2+ symmetric, upper and lower extremities, normal cap refill Neurological: alert, oriented x 3, CN2-12 intact, strength normal upper extremities and lower extremities, sensation decreased throughout bilateral lower feet, DTRs 2+ throughout, no cerebellar signs, gait normal Psychiatric: normal affect, behavior normal, pleasant   Vicie Mutters, PA-C   03/25/2016

## 2016-03-25 NOTE — Patient Instructions (Signed)
    Bad carbs also include fruit juice, alcohol, and sweet tea. These are empty calories that do not signal to your brain that you are full.   Please remember the good carbs are still carbs which convert into sugar. So please measure them out no more than 1/2-1 cup of rice, oatmeal, pasta, and beans  Veggies are however free foods! Pile them on.   Not all fruit is created equal. Please see the list below, the fruit at the bottom is higher in sugars than the fruit at the top. Please avoid all dried fruits.     Recommendations For Diabetic/Prediabetic Patients:   -  Take medications as prescribed  -  Recommend Dr Joel Fuhrman's book "The End of Diabetes "  And "The End of Dieting"- Can get at  www.Amazon.com and encourage also get the Audio CD book  - AVOID Animal products, ie. Meat - red/white, Poultry and Dairy/especially cheese - Exercise at least 5 times a week for 30 minutes or preferably daily.  - No Smoking - Drink less than 2 drinks a day.  - Monitor your feet for sores - Have yearly Eye Exams - Recommend annual Flu vaccine  - Recommend Pneumovax and Prevnar vaccines - Shingles Vaccine (Zostavax) if over 60 y.o.  Goals:   - BMI less than 24 - Fasting sugar less than 130 or less than 150 if tapering medicines to lose weight  - Systolic BP less than 130  - Diastolic BP less than 80 - Bad LDL Cholesterol less than 70 - Triglycerides less than 150  

## 2016-03-26 LAB — HEMOGLOBIN A1C
Hgb A1c MFr Bld: 6 % — ABNORMAL HIGH (ref <5.7)
Mean Plasma Glucose: 126 mg/dL

## 2016-03-26 LAB — LIPID PANEL
Cholesterol: 135 mg/dL (ref <200)
HDL: 26 mg/dL — ABNORMAL LOW (ref >50)
LDL Cholesterol: 63 mg/dL (ref <100)
Total CHOL/HDL Ratio: 5.2 Ratio — ABNORMAL HIGH (ref <5.0)
Triglycerides: 230 mg/dL — ABNORMAL HIGH (ref <150)
VLDL: 46 mg/dL — ABNORMAL HIGH (ref <30)

## 2016-03-26 LAB — BASIC METABOLIC PANEL WITH GFR
BUN: 31 mg/dL — ABNORMAL HIGH (ref 7–25)
CO2: 26 mmol/L (ref 20–31)
Calcium: 10.9 mg/dL — ABNORMAL HIGH (ref 8.6–10.4)
Chloride: 105 mmol/L (ref 98–110)
Creat: 1.86 mg/dL — ABNORMAL HIGH (ref 0.60–0.88)
GFR, Est African American: 29 mL/min — ABNORMAL LOW (ref >=60)
GFR, Est Non African American: 25 mL/min — ABNORMAL LOW (ref >=60)
Glucose, Bld: 160 mg/dL — ABNORMAL HIGH (ref 65–99)
Potassium: 4.9 mmol/L (ref 3.5–5.3)
Sodium: 142 mmol/L (ref 135–146)

## 2016-03-26 LAB — HEPATIC FUNCTION PANEL
ALT: 22 U/L (ref 6–29)
AST: 16 U/L (ref 10–35)
Albumin: 4.5 g/dL (ref 3.6–5.1)
Alkaline Phosphatase: 34 U/L (ref 33–130)
Bilirubin, Direct: 0.1 mg/dL (ref <=0.2)
Indirect Bilirubin: 0.3 mg/dL (ref 0.2–1.2)
Total Bilirubin: 0.4 mg/dL (ref 0.2–1.2)
Total Protein: 6.8 g/dL (ref 6.1–8.1)

## 2016-03-26 LAB — MAGNESIUM: Magnesium: 2.2 mg/dL (ref 1.5–2.5)

## 2016-03-29 LAB — LEVETIRACETAM LEVEL: Keppra (Levetiracetam): 42.6 ug/mL

## 2016-03-31 ENCOUNTER — Other Ambulatory Visit: Payer: Self-pay | Admitting: Internal Medicine

## 2016-04-15 ENCOUNTER — Other Ambulatory Visit: Payer: Self-pay | Admitting: Physician Assistant

## 2016-04-15 DIAGNOSIS — L97511 Non-pressure chronic ulcer of other part of right foot limited to breakdown of skin: Secondary | ICD-10-CM

## 2016-04-15 MED ORDER — FUROSEMIDE 40 MG PO TABS: 40.0000 mg | ORAL_TABLET | Freq: Every day | ORAL | 1 refills | Status: DC

## 2016-04-23 ENCOUNTER — Other Ambulatory Visit: Payer: Self-pay | Admitting: *Deleted

## 2016-04-23 MED ORDER — LANSOPRAZOLE 30 MG PO CPDR: DELAYED_RELEASE_CAPSULE | ORAL | 4 refills | Status: DC

## 2016-05-09 ENCOUNTER — Other Ambulatory Visit: Payer: Self-pay | Admitting: Internal Medicine

## 2016-05-13 ENCOUNTER — Encounter: Payer: Self-pay | Admitting: Internal Medicine

## 2016-05-15 ENCOUNTER — Other Ambulatory Visit: Payer: Self-pay | Admitting: *Deleted

## 2016-05-15 MED ORDER — MECLIZINE HCL 25 MG PO TABS: 25.0000 mg | ORAL_TABLET | Freq: Every day | ORAL | 3 refills | Status: DC

## 2016-05-16 ENCOUNTER — Other Ambulatory Visit: Payer: Self-pay | Admitting: *Deleted

## 2016-05-16 MED ORDER — ATENOLOL 50 MG PO TABS: 50.0000 mg | ORAL_TABLET | Freq: Every day | ORAL | 1 refills | Status: DC

## 2016-05-20 ENCOUNTER — Ambulatory Visit: Payer: Medicare Other | Admitting: Adult Health

## 2016-06-04 ENCOUNTER — Ambulatory Visit: Payer: Medicare Other | Admitting: Podiatry

## 2016-06-13 ENCOUNTER — Ambulatory Visit (INDEPENDENT_AMBULATORY_CARE_PROVIDER_SITE_OTHER): Payer: Medicare Other | Admitting: Podiatry

## 2016-06-13 ENCOUNTER — Encounter: Payer: Self-pay | Admitting: Podiatry

## 2016-06-13 DIAGNOSIS — L98492 Non-pressure chronic ulcer of skin of other sites with fat layer exposed: Secondary | ICD-10-CM

## 2016-06-13 DIAGNOSIS — M79674 Pain in right toe(s): Secondary | ICD-10-CM

## 2016-06-13 DIAGNOSIS — B351 Tinea unguium: Secondary | ICD-10-CM | POA: Diagnosis not present

## 2016-06-13 DIAGNOSIS — E1149 Type 2 diabetes mellitus with other diabetic neurological complication: Secondary | ICD-10-CM

## 2016-06-13 DIAGNOSIS — E1140 Type 2 diabetes mellitus with diabetic neuropathy, unspecified: Secondary | ICD-10-CM

## 2016-06-13 NOTE — Progress Notes (Signed)
This patient presents the office for preventative foot care services. She says she made this appointment for trimming of her nails. She says that the nails have grown long and thick and she is unable to self treat. This patient also says that she continues to have drainage from the diabetic ulcer on the outside forefoot of her right foot. She says this ulcer has been open since September and has been draining. She says it is occasionally clear and occasionally has pus. She has been caring for the wound using peroxide and bandaging. She presents the office today for preventative foot care services   GENERAL APPEARANCE: Alert, conversant. Appropriately groomed. No acute distress.  VASCULAR: Pedal pulses are  palpable at  [facility] and PT bilateral.  Capillary refill time is immediate to all digits,  Normal temperature gradient.  Digital hair growth is present bilateral  NEUROLOGIC: sensation is normal to 5.07 monofilament at 5/5 sites bilateral.  Light touch is intact bilateral, Muscle strength normal.  MUSCULOSKELETAL: acceptable muscle strength, tone and stability bilateral.  Intrinsic muscluature intact bilateral.  Rectus appearance of foot and digits noted bilateral. Tailors bunion right greater than left.  DERMATOLOGIC: skin color, texture, and turgor are within normal limits except pinhole opening right foot at site of tailors bunion..there is no evidence of redness or swelling or cellulitis right foot.  Onychomycosis  Diabetic ulcer right foot.  Debride,ment of nails.  Discussed with the patient her diabetic ulcer. This has  now been open for over 8 months. I told her I would like to make another effort to close this ulcer to prevent another infection.  Therefore, an Haematologist was applied to her left foot. She was prescribed doxycycline to be taken in an effort to clear up the ulcer site.  Patient was told to return to the office in one week for Unna boot removal and evaluation of the ulcerated site.  Neosporin and a dry sterile dressing was applied to the ulcer site prior to the application of the [employer]. She is to walk in a surgical shoe when the Unna boot is on her right foot.Gardiner Barefoot DPM

## 2016-06-14 MED ORDER — DOXYCYCLINE HYCLATE 100 MG PO TABS: 100.0000 mg | ORAL_TABLET | Freq: Two times a day (BID) | ORAL | 0 refills | Status: DC

## 2016-06-14 NOTE — Addendum Note (Signed)
Addended byDeidre Ala, Charli Halle L on: 06/14/2016 07:55 AM   Modules accepted: Orders

## 2016-06-17 ENCOUNTER — Ambulatory Visit (INDEPENDENT_AMBULATORY_CARE_PROVIDER_SITE_OTHER): Payer: Medicare Other | Admitting: Adult Health

## 2016-06-17 ENCOUNTER — Encounter: Payer: Self-pay | Admitting: Adult Health

## 2016-06-17 VITALS — BP 147/68 | HR 83 | Ht 62.0 in | Wt 205.2 lb

## 2016-06-17 DIAGNOSIS — R569 Unspecified convulsions: Secondary | ICD-10-CM

## 2016-06-17 NOTE — Patient Instructions (Signed)
Continue Keppra If you have any seizure events please let us know If your symptoms worsen or you develop new symptoms please let us know.

## 2016-06-17 NOTE — Progress Notes (Signed)
PATIENT: [name] [name] DOB: 02-27-1932  REASON FOR VISIT: follow up- seizures HISTORY FROM: patient  HISTORY OF PRESENT ILLNESS: [name] [name] is an 83 year old female with a history of seizures. She returns today for follow-up. She remains on Keppra taking 500 mg 3 times a day. She denies any seizure events. She continues to live at [facility]. Denies any changes with her gait or balance. Denies any changes with her mood. She states that her dizziness did improve on Keppra although she does notice mild dizziness with weather changes. She is able to complete all ADLs independently. She does not operate a motor vehicle. She does have a boot on the right foot after an infection from a pedicure. She returns today for an evaluation.   Interval history from 11/20/2015, [name] [name] is seen here today for a regular visit in regarding to her seizure care and refills - but also to discuss her CPAP, which she hasn't used in many month. She has not been a sleep patient here before, her sleep study was done in [place]. She never has seen the sleep doctor.  CPAP was ordered through another source. She  reported that she became noncompliant because the pillows did hurt her nostrils and irritated the skin, and she would wake up dizzy felt overall she slept better without then with CPAP. Given this case scenario I would offer her a change in interface, or adjustment in settings, but at this time the patient does not want using CPAP again. She needs to get a medical note to [place] , that they stopped billing her for supplies she doesn't use. The CPAP was not ordered through [facility] but through her PCP, Dr Melford Aase. We dd not order it, and she had no CPAP related visits with [place] before today.     REVIEW OF SYSTEMS: Out of a complete 14 system review of symptoms, the patient complains only of the following symptoms, and all other reviewed systems are negative.  Leg swelling, hearing loss, numbness, weakness,  muscle cramps  ALLERGIES: Allergies  Allergen Reactions  . Ppd [Tuberculin Purified Protein Derivative] Other (See Comments)    indurated    HOME MEDICATIONS: Outpatient Medications Prior to Visit  Medication Sig Dispense Refill  . acetaminophen (TYLENOL) 500 MG tablet Take 1,000 mg by mouth every 6 (six) hours as needed for moderate pain.    Marland Kitchen aspirin 81 MG tablet Take 81 mg by mouth daily.    Marland Kitchen atenolol (TENORMIN) 50 MG tablet Take 1 tablet (50 mg total) by mouth daily. 90 tablet 1  . Biotin 5000 MCG TABS Take 5,000 Units by mouth 2 (two) times daily.     Marland Kitchen doxycycline (VIBRA-TABS) 100 MG tablet Take 1 tablet (100 mg total) by mouth 2 (two) times daily. 20 tablet 0  . ferrous sulfate dried (SLOW FE) 160 (50 FE) MG TBCR Take 160 mg by mouth 2 (two) times daily.     . fish oil-omega-3 fatty acids 1000 MG capsule Take 2 g by mouth at bedtime.     . Flaxseed, Linseed, (FLAX SEED OIL) 1000 MG CAPS Take 1 capsule by mouth 3 (three) times daily.    Marland Kitchen FREESTYLE INSULINX TEST test strip USE ONE STRIP TO CHECK GLUCOSE ONCE DAILY IN THE MORNING 100 each 1  . furosemide (LASIX) 40 MG tablet Take 1 tablet (40 mg total) by mouth daily. 90 tablet 1  . glimepiride (AMARYL) 4 MG tablet take 1 tablet by mouth once daily WITH BREAKFAST 90  tablet 1  . Lancets (FREESTYLE) lancets USE ONE  TO CHECK GLUCOSE ONCE DAILY IN THE MORNING 100 each 1  . lansoprazole (PREVACID) 30 MG capsule TAKE 1 BY MOUTH DAILY 90 capsule 4  . levETIRAcetam (KEPPRA) 500 MG tablet TAKE 1 BY MOUTH 3 TIMES DAILY AFTER MEALS 90 tablet 1  . levothyroxine (SYNTHROID, LEVOTHROID) 100 MCG tablet Take 1 tablet (100 mcg total) by mouth daily. 90 tablet 3  . loratadine (CLARITIN) 10 MG tablet Take 10 mg by mouth daily.    . magnesium oxide (MAG-OX) 400 MG tablet Take 400 mg by mouth 3 (three) times daily.     . meclizine (ANTIVERT) 25 MG tablet Take 1 tablet (25 mg total) by mouth daily. 90 tablet 3  . metFORMIN (GLUCOPHAGE-XR) 500 MG 24 hr  tablet Take 2 tablets 2 x / day after meals 360 tablet 1  . potassium chloride SA (K-DUR,KLOR-CON) 20 MEQ tablet Take 1 tablet (20 mEq total) by mouth daily. 30 tablet 0  . saxagliptin HCl (ONGLYZA) 5 MG TABS tablet Take 1 tablet daily as directed for Diabetes 90 tablet 3  . vitamin B-12 (CYANOCOBALAMIN) 100 MCG tablet Take 50 mcg by mouth daily.    . vitamin C (ASCORBIC ACID) 500 MG tablet Take 500 mg by mouth daily.     No facility-administered medications prior to visit.     PAST MEDICAL HISTORY: Past Medical History:  Diagnosis Date  . Anemia, unspecified   . Arthritis    "knees; right shoulder" (09/15/2013)  . Basal cell carcinoma    "right upper outer lip"  . DM neuropathy, type II diabetes mellitus ([place])   . Epilepsy ([place])   . GERD (gastroesophageal reflux disease)   . Gout   . High cholesterol   . Hypertension   . Hypothyroidism   . Meniere's disease   . Obesity (BMI 30-39.9)   . Other forms of epilepsy and recurrent seizures without mention of intractable epilepsy 11/04/2012   Non convulsive paroxysmal spells, responding to [facility], patient not driving.   . Pneumonia ~ 1943  . Skin cancer    "forehead; right hand"  . Type II or unspecified type diabetes mellitus without mention of complication, not stated as uncontrolled   . UTI (urinary tract infection)   . Vitamin D deficiency     PAST SURGICAL HISTORY: Past Surgical History:  Procedure Laterality Date  . CERVICAL POLYPECTOMY    . HAMMER TOE SURGERY Left 1980's  . MOHS SURGERY  20087   "right upper outer lip"  . TONSILLECTOMY  ~ 1947  . WRIST SURGERY Left ~ 1950   "ran arm thru window"    FAMILY HISTORY: Family History  Problem Relation Age of Onset  . Heart disease Mother   . Stroke Mother   . Heart disease Father   . Ovarian cancer Sister   . Hypertension Son   . Hypertension Son   . Diabetes Son   . Alcohol abuse Son     SOCIAL HISTORY: Social History   Social History  . Marital status:  Widowed    Spouse name: N/A  . Number of children: 3  . Years of education: 57   Occupational History  . retired    Social History Main Topics  . Smoking status: Former Smoker    Packs/day: 1.00    Years: 30.00    Types: Cigarettes    Quit date: 06/29/1983  . Smokeless tobacco: Never Used  . Alcohol use 0.0 oz/week  Comment: 09/15/2013 "glass of wine maybe once/year"  . Drug use: No  . Sexual activity: Not Currently   Other Topics Concern  . Not on file   Social History Narrative   Patient lives at home alone and she is widowed.   Retired.   Education some college.   Right handed.   Caffeine sometimes not daily.       PHYSICAL EXAM  Vitals:   06/17/16 1054  BP: (!) 147/68  Pulse: 83  Weight: 205 lb 3.2 oz (93.1 kg)  Height: 5\' 2"  (1.575 m)   Body mass index is 37.53 kg/m.  Generalized: Well developed, in no acute distress   Neurological examination  Mentation: Alert oriented to time, place, history taking. Follows all commands speech and language fluent Cranial nerve II-XII: Pupils were equal round reactive to light. Extraocular movements were full, visual field were full on confrontational test. Facial sensation and strength were normal. Uvula tongue midline. Head turning and shoulder shrug  were normal and symmetric. Motor: The motor testing reveals 5 over 5 strength of all 4 extremities. Good symmetric motor tone is noted throughout.  Sensory: Sensory testing is intact to soft touch on all 4 extremities. No evidence of extinction is noted.  Coordination: Cerebellar testing reveals good finger-nose-finger and heel-to-shin bilaterally.  Gait and station: Slight limp on the left due to boot. Tandem gait not attended. Reflexes: Deep tendon reflexes are symmetric and normal bilaterally.   DIAGNOSTIC DATA (LABS, IMAGING, TESTING) - I reviewed patient records, labs, notes, testing and imaging myself where available.  Lab Results  Component Value Date   WBC  6.0 03/25/2016   HGB 12.4 03/25/2016   HCT 38.6 03/25/2016   MCV 91.5 03/25/2016   PLT 286 03/25/2016      Component Value Date/Time   NA 142 03/25/2016 0910   K 4.9 03/25/2016 0910   CL 105 03/25/2016 0910   CO2 26 03/25/2016 0910   GLUCOSE 160 (H) 03/25/2016 0910   BUN 31 (H) 03/25/2016 0910   CREATININE 1.86 (H) 03/25/2016 0910   CALCIUM 10.9 (H) 03/25/2016 0910   PROT 6.8 03/25/2016 0910   ALBUMIN 4.5 03/25/2016 0910   AST 16 03/25/2016 0910   ALT 22 03/25/2016 0910   ALKPHOS 34 03/25/2016 0910   BILITOT 0.4 03/25/2016 0910   GFRNONAA 25 (L) 03/25/2016 0910   GFRAA 29 (L) 03/25/2016 0910   Lab Results  Component Value Date   CHOL 135 03/25/2016   HDL 26 (L) 03/25/2016   LDLCALC 63 03/25/2016   TRIG 230 (H) 03/25/2016   CHOLHDL 5.2 (H) 03/25/2016   Lab Results  Component Value Date   HGBA1C 6.0 (H) 03/25/2016   Lab Results  Component Value Date   VITAMINB12 1082 (H) 09/24/2010   Lab Results  Component Value Date   TSH 1.09 03/25/2016      ASSESSMENT AND PLAN 83 y.o. year old female  has a past medical history of Anemia, unspecified; Arthritis; Basal cell carcinoma; DM neuropathy, type II diabetes mellitus ([place]); Epilepsy ([place]); GERD (gastroesophageal reflux disease); Gout; High cholesterol; Hypertension; Hypothyroidism; Meniere's disease; Obesity (BMI 30-39.9); Other forms of epilepsy and recurrent seizures without mention of intractable epilepsy (11/04/2012); Pneumonia (~ 1943); Skin cancer; Type II or unspecified type diabetes mellitus without mention of complication, not stated as uncontrolled; UTI (urinary tract infection); and Vitamin D deficiency. here with:  1. Seizures  Overall the patient is doing well. She will continue on Keppra 500 mg 3 times a day.  This is been filled by her primary care provider. Patient is advised that if she has any seizure event she should let us know. She will follow-up in 6 months or sooner if needed.   I sent 15 minutes  with the patient 50% of This time reviewing seizure precautions.    Ward Givens, MSN, NP-C 06/17/2016, 11:14 AM [facility] [address], [place], [place] [address] [phone]

## 2016-06-18 NOTE — Progress Notes (Signed)
I agree with the assessment and plan as directed by NP . PS:  Is she transferring CPAP care to [place], or not longer using CPAP at all?    Julann Mcgilvray, MD

## 2016-06-18 NOTE — Progress Notes (Signed)
I agree with the assessment and plan as directed by NP .The patient is known to me .   Tamaka Sawin, MD  

## 2016-06-18 NOTE — Progress Notes (Signed)
We are not managing her CPAP

## 2016-06-20 ENCOUNTER — Encounter: Payer: Self-pay | Admitting: Podiatry

## 2016-06-20 ENCOUNTER — Ambulatory Visit (INDEPENDENT_AMBULATORY_CARE_PROVIDER_SITE_OTHER): Payer: Medicare Other | Admitting: Podiatry

## 2016-06-20 DIAGNOSIS — L98492 Non-pressure chronic ulcer of skin of other sites with fat layer exposed: Secondary | ICD-10-CM | POA: Diagnosis not present

## 2016-06-20 DIAGNOSIS — E1149 Type 2 diabetes mellitus with other diabetic neurological complication: Secondary | ICD-10-CM

## 2016-06-20 DIAGNOSIS — E1140 Type 2 diabetes mellitus with diabetic neuropathy, unspecified: Secondary | ICD-10-CM

## 2016-06-20 NOTE — Progress Notes (Signed)
This patient presents to  the office for  continued evaluation of her diabetic ulcer right foot. She had been unable to have the ulcer closed and when she came to the office last week for nails. I recommended we try again to help close the ulcer on the outside ball of her right foot. She was prescribed antibiotics and placed in an Unna boot and a surgical shoe. She presents the office today and the Unna boot is removed revealing closing of the ulcer on the right foot   GENERAL APPEARANCE: Alert, conversant. Appropriately groomed. No acute distress.  VASCULAR: Pedal pulses are  palpable at  [facility] and PT bilateral.  Capillary refill time is immediate to all digits,  Normal temperature gradient.  Digital hair growth is present bilateral  NEUROLOGIC: sensation is normal to 5.07 monofilament at 5/5 sites bilateral.  Light touch is intact bilateral, Muscle strength normal.  MUSCULOSKELETAL: acceptable muscle strength, tone and stability bilateral.  Intrinsic muscluature intact bilateral.  Rectus appearance of foot and digits noted bilateral. Tailors bunion right greater than left.  DERMATOLOGIC: skin color, texture, and turgor are within normal limits . There is closure of the diabetic ulcer over the tailors bunion, right foot .There is no evidence of redness or swelling or cellulitis right foot.  Onychomycosis  Diabetic ulcer right foot.  Removal of the Unna boot, right foot  Discussed with the patient her diabetic ulcer. Her diabetic ulcer is closed compared  to prior to her ulcer last week.  Told her to apply Neosporin and a dry sterile dressing and ambulate with the surgical shoe. He is to return the office in 2 weeks for continued evaluation and treatment     Gardiner Barefoot DPM

## 2016-06-21 ENCOUNTER — Other Ambulatory Visit: Payer: Self-pay | Admitting: *Deleted

## 2016-06-21 DIAGNOSIS — E119 Type 2 diabetes mellitus without complications: Secondary | ICD-10-CM

## 2016-06-21 MED ORDER — GLIMEPIRIDE 4 MG PO TABS: ORAL_TABLET | ORAL | 1 refills | Status: DC

## 2016-07-08 ENCOUNTER — Other Ambulatory Visit: Payer: Self-pay | Admitting: Internal Medicine

## 2016-07-08 DIAGNOSIS — Z1231 Encounter for screening mammogram for malignant neoplasm of breast: Secondary | ICD-10-CM

## 2016-07-12 ENCOUNTER — Other Ambulatory Visit: Payer: Self-pay | Admitting: *Deleted

## 2016-07-12 MED ORDER — LEVETIRACETAM 500 MG PO TABS: ORAL_TABLET | ORAL | 1 refills | Status: DC

## 2016-07-22 ENCOUNTER — Encounter: Payer: Self-pay | Admitting: Internal Medicine

## 2016-07-22 ENCOUNTER — Ambulatory Visit (INDEPENDENT_AMBULATORY_CARE_PROVIDER_SITE_OTHER): Payer: Medicare Other | Admitting: Internal Medicine

## 2016-07-22 VITALS — BP 136/82 | HR 80 | Temp 97.6°F | Resp 16 | Ht 61.5 in | Wt 200.0 lb

## 2016-07-22 DIAGNOSIS — Z79899 Other long term (current) drug therapy: Secondary | ICD-10-CM

## 2016-07-22 DIAGNOSIS — E1122 Type 2 diabetes mellitus with diabetic chronic kidney disease: Secondary | ICD-10-CM

## 2016-07-22 DIAGNOSIS — I10 Essential (primary) hypertension: Secondary | ICD-10-CM

## 2016-07-22 DIAGNOSIS — E782 Mixed hyperlipidemia: Secondary | ICD-10-CM

## 2016-07-22 DIAGNOSIS — Z1212 Encounter for screening for malignant neoplasm of rectum: Secondary | ICD-10-CM

## 2016-07-22 DIAGNOSIS — N184 Chronic kidney disease, stage 4 (severe): Secondary | ICD-10-CM

## 2016-07-22 DIAGNOSIS — R632 Polyphagia: Secondary | ICD-10-CM

## 2016-07-22 DIAGNOSIS — Z136 Encounter for screening for cardiovascular disorders: Secondary | ICD-10-CM

## 2016-07-22 DIAGNOSIS — Z0000 Encounter for general adult medical examination without abnormal findings: Secondary | ICD-10-CM

## 2016-07-22 DIAGNOSIS — K219 Gastro-esophageal reflux disease without esophagitis: Secondary | ICD-10-CM

## 2016-07-22 DIAGNOSIS — Z0001 Encounter for general adult medical examination with abnormal findings: Secondary | ICD-10-CM

## 2016-07-22 DIAGNOSIS — E1149 Type 2 diabetes mellitus with other diabetic neurological complication: Secondary | ICD-10-CM

## 2016-07-22 DIAGNOSIS — E559 Vitamin D deficiency, unspecified: Secondary | ICD-10-CM

## 2016-07-22 DIAGNOSIS — G40909 Epilepsy, unspecified, not intractable, without status epilepticus: Secondary | ICD-10-CM

## 2016-07-22 DIAGNOSIS — E039 Hypothyroidism, unspecified: Secondary | ICD-10-CM

## 2016-07-22 DIAGNOSIS — L03032 Cellulitis of left toe: Secondary | ICD-10-CM

## 2016-07-22 DIAGNOSIS — Z6837 Body mass index (BMI) 37.0-37.9, adult: Secondary | ICD-10-CM

## 2016-07-22 LAB — CBC WITH DIFFERENTIAL/PLATELET
Basophils Absolute: 66 cells/uL (ref 0–200)
Basophils Relative: 1 %
Eosinophils Absolute: 726 cells/uL — ABNORMAL HIGH (ref 15–500)
Eosinophils Relative: 11 %
HCT: 38.4 % (ref 35.0–45.0)
Hemoglobin: 12.2 g/dL (ref 11.7–15.5)
Lymphocytes Relative: 21 %
Lymphs Abs: 1386 cells/uL (ref 850–3900)
MCH: 28.6 pg (ref 27.0–33.0)
MCHC: 31.8 g/dL — ABNORMAL LOW (ref 32.0–36.0)
MCV: 89.9 fL (ref 80.0–100.0)
MPV: 10.3 fL (ref 7.5–12.5)
Monocytes Absolute: 528 cells/uL (ref 200–950)
Monocytes Relative: 8 %
Neutro Abs: 3894 cells/uL (ref 1500–7800)
Neutrophils Relative %: 59 %
Platelets: 287 10*3/uL (ref 140–400)
RBC: 4.27 MIL/uL (ref 3.80–5.10)
RDW: 16.4 % — ABNORMAL HIGH (ref 11.0–15.0)
WBC: 6.6 10*3/uL (ref 3.8–10.8)

## 2016-07-22 LAB — TSH: TSH: 1.22 mIU/L

## 2016-07-22 MED ORDER — DOXYCYCLINE HYCLATE 100 MG PO CAPS: ORAL_CAPSULE | ORAL | 0 refills | Status: AC

## 2016-07-22 NOTE — Progress Notes (Signed)
[facility] Unk Pinto, M.D.      Uvaldo Bristle. Silverio Lay, P.A.-[facility]                [address] [place], [place] [address] Telephone [phone] Telefax [phone]  Annual Screening/Preventative Visit & Comprehensive Evaluation &  Examination     This very nice 83 y.o.  [facility] presents for a Screening/Preventative Visit & comprehensive evaluation and management of multiple medical co-morbidities.  Patient has been followed for HTN, T2_NIDDM/CKD4, Hyperlipidemia and Vitamin D Deficiency. Patient also has hx/o Seizure Disorder manifest with fugue states /automatisms controlled on Keppra and followed by Dr Brett Fairy. Patient has been on Thyroid Replacement circa 1980's.      HTN predates since 1999. Patient's BP has been controlled at home and patient denies any cardiac symptoms as chest pain, palpitations, shortness of breath, dizziness or ankle swelling. Today's BP is at goal - 136/82.      Patient's hyperlipidemia is controlled with diet and medications. Patient denies myalgias or other medication SE's. Last lipids were at goal albeit elevated Trig's: Lab Results  Component Value Date   CHOL 135 03/25/2016   HDL 26 (L) 03/25/2016   LDLCALC 63 03/25/2016   TRIG 230 (H) 03/25/2016   CHOLHDL 5.2 (H) 03/25/2016      Patient has Morbid Obesity (BMI 37+) and consequent T2_NIDDM w/CKD (GFR 25) and predating since 2001 and patient denies reactive hypoglycemic symptoms, visual blurring, diabetic polys, but does report occasional numb tingling paresthesias of her hands and feet. Last A1c was not at goal: Lab Results  Component Value Date   HGBA1C 6.0 (H) 03/25/2016      Finally, patient has history of Vitamin D Deficiency ("37" in 2008) and last Vitamin D was at goal: Lab Results  Component Value Date   VD25OH 82 12/18/2015   Current Outpatient Prescriptions on File Prior to Visit  Medication Sig   . acetaminophen (TYLENOL) 500 MG tablet Take 1,000 mg by mouth every 6 (six) hours as needed for moderate pain.  Marland Kitchen aspirin 81 MG tablet Take 81 mg by mouth daily.  Marland Kitchen atenolol (TENORMIN) 50 MG tablet Take 1 tablet (50 mg total) by mouth daily.  . Biotin 5000 MCG TABS Take 5,000 Units by mouth 2 (two) times daily.   . ferrous sulfate dried (SLOW FE) 160 (50 FE) MG TBCR Take 160 mg by mouth 2 (two) times daily.   . fish oil-omega-3 fatty acids 1000 MG capsule Take 2 g by mouth at bedtime.   . Flaxseed, Linseed, (FLAX SEED OIL) 1000 MG CAPS Take 1 capsule by mouth 3 (three) times daily.  Marland Kitchen FREESTYLE INSULINX TEST test strip USE ONE STRIP TO CHECK GLUCOSE ONCE DAILY IN THE MORNING  . furosemide (LASIX) 40 MG tablet Take 1 tablet (40 mg total) by mouth daily.  Marland Kitchen glimepiride (AMARYL) 4 MG tablet take 1 tablet by mouth once daily WITH BREAKFAST  . Lancets (FREESTYLE) lancets USE ONE  TO CHECK GLUCOSE ONCE DAILY IN THE MORNING  . lansoprazole (PREVACID) 30 MG capsule TAKE 1 BY MOUTH DAILY  . levETIRAcetam (KEPPRA) 500 MG tablet TAKE 1 BY MOUTH 3 TIMES DAILY AFTER MEALS  . levothyroxine (SYNTHROID, LEVOTHROID) 100 MCG tablet Take 1 tablet (100 mcg total) by mouth daily.  Marland Kitchen loratadine (CLARITIN) 10 MG tablet Take 10 mg by mouth daily.  . magnesium  oxide (MAG-OX) 400 MG tablet Take 400 mg by mouth 3 (three) times daily.   . meclizine (ANTIVERT) 25 MG tablet Take 1 tablet (25 mg total) by mouth daily.  . metFORMIN (GLUCOPHAGE-XR) 500 MG 24 hr tablet Take 2 tablets 2 x / day after meals  . potassium chloride SA (K-DUR,KLOR-CON) 20 MEQ tablet Take 1 tablet (20 mEq total) by mouth daily.  . saxagliptin HCl (ONGLYZA) 5 MG TABS tablet Take 1 tablet daily as directed for Diabetes  . vitamin B-12 (CYANOCOBALAMIN) 100 MCG tablet Take 50 mcg by mouth daily.  . vitamin C (ASCORBIC ACID) 500 MG tablet Take 500 mg by mouth daily.   No current facility-administered medications on file prior to visit.    Allergies   Allergen Reactions  . Ppd [Tuberculin Purified Protein Derivative] Other (See Comments)    indurated   Past Medical History:  Diagnosis Date  . Anemia, unspecified   . Arthritis    "knees; right shoulder" (09/15/2013)  . Basal cell carcinoma    "right upper outer lip"  . DM neuropathy, type II diabetes mellitus ([place])   . Epilepsy ([place])   . GERD (gastroesophageal reflux disease)   . Gout   . High cholesterol   . Hypertension   . Hypothyroidism   . Meniere's disease   . Obesity (BMI 30-39.9)   . Other forms of epilepsy and recurrent seizures without mention of intractable epilepsy 11/04/2012   Non convulsive paroxysmal spells, responding to [facility], patient not driving.   . Pneumonia ~ 1943  . Skin cancer    "forehead; right hand"  . Type II or unspecified type diabetes mellitus without mention of complication, not stated as uncontrolled   . UTI (urinary tract infection)   . Vitamin D deficiency    Health Maintenance  Topic Date Due  . URINE MICROALBUMIN  12/12/2015  . INFLUENZA VACCINE  09/18/2016  . HEMOGLOBIN A1C  09/22/2016  . OPHTHALMOLOGY EXAM  01/02/2017  . FOOT EXAM  01/21/2017  . TETANUS/TDAP  03/17/2017  . DEXA SCAN  Completed  . PNA vac Low Risk Adult  Completed   Immunization History  Administered Date(s) Administered  . DTaP 03/18/2007  . Influenza, High Dose Seasonal PF 10/18/2013, 12/12/2014, 10/24/2015  . Influenza-Unspecified 11/18/2012  . Pneumococcal Conjugate-13 12/12/2014  . Pneumococcal Polysaccharide-23 09/16/2013  . Zoster 02/28/2005   Past Surgical History:  Procedure Laterality Date  . CERVICAL POLYPECTOMY    . HAMMER TOE SURGERY Left 1980's  . MOHS SURGERY  [address]87   "right upper outer lip"  . TONSILLECTOMY  ~ 1947  . WRIST SURGERY Left ~ 1950   "ran arm thru window"   Family History  Problem Relation Age of Onset  . Heart disease Mother   . Stroke Mother   . Heart disease Father   . Ovarian cancer Sister   . Hypertension Son   .  Hypertension Son   . Diabetes Son   . Alcohol abuse Son    Social History  Substance Use Topics  . Smoking status: Former Smoker    Packs/day: 1.00    Years: 30.00    Types: Cigarettes    Quit date: 06/29/1983  . Smokeless tobacco: Never Used  . Alcohol use 0.0 oz/week     Comment: 09/15/2013 "glass of wine maybe once/year"    ROS Constitutional: Denies fever, chills, weight loss/gain, headaches, insomnia,  night sweats, and change in appetite. Does c/o fatigue. Eyes: Denies redness, blurred vision, diplopia, discharge, itchy, watery  eyes.  ENT: Denies discharge, congestion, post nasal drip, epistaxis, sore throat, earache, hearing loss, dental pain, Tinnitus, Vertigo, Sinus pain, snoring.  Cardio: Denies chest pain, palpitations, irregular heartbeat, syncope, dyspnea, diaphoresis, orthopnea, PND, claudication, edema Respiratory: denies cough, dyspnea, DOE, pleurisy, hoarseness, laryngitis, wheezing.  Gastrointestinal: Denies dysphagia, heartburn, reflux, water brash, pain, cramps, nausea, vomiting, bloating, diarrhea, constipation, hematemesis, melena, hematochezia, jaundice, hemorrhoids Genitourinary: Denies dysuria, frequency, urgency, nocturia, hesitancy, discharge, hematuria, flank pain Breast: Breast lumps, nipple discharge, bleeding.  Musculoskeletal: Denies arthralgia, myalgia, stiffness, Jt. Swelling, pain, limp, and strain/sprain. Denies falls. Skin: Denies puritis, rash, hives, warts, acne, eczema, changing in skin lesion Neuro: No weakness, tremor, incoordination, spasms, paresthesia, pain Psychiatric: Denies confusion, memory loss, sensory loss. Denies Depression. Endocrine: Denies change in weight, skin, hair change, nocturia, and paresthesia, diabetic polys, visual blurring, hyper / hypo glycemic episodes.  Heme/Lymph: No excessive bleeding, bruising, enlarged lymph nodes.  Physical Exam  BP 136/82   Pulse 80   Temp 97.6 F (36.4 C)   Resp 16   Ht 5' 1.5" (1.562 m)    Wt 200 lb (90.7 kg)   BMI 37.18 kg/m   General Appearance: Over nourished, well groomed and in no apparent distress.  Eyes: PERRLA, EOMs, conjunctiva no swelling or erythema, normal fundi and vessels. Sinuses: No frontal/maxillary tenderness ENT/Mouth: EACs patent / TMs  nl. Nares clear without erythema, swelling, mucoid exudates. Oral hygiene is good. No erythema, swelling, or exudate. Tongue normal, non-obstructing. Tonsils not swollen or erythematous. Hearing normal.  Neck: Supple, thyroid normal. No bruits, nodes or JVD. Respiratory: Respiratory effort normal.  BS equal and clear bilateral without rales, rhonci, wheezing or stridor. Cardio: Heart sounds are normal with regular rate and rhythm and no murmurs, rubs or gallops. Peripheral pulses are normal and equal bilaterally without edema. No aortic or femoral bruits. Chest: symmetric with normal excursions and percussion. Breasts: Symmetric, without lumps, nipple discharge, retractions, or fibrocystic changes.  Abdomen: Rotund & soft with bowel sounds active. Nontender, no guarding, rebound, hernias, masses, or organomegaly.  Lymphatics: Non tender without lymphadenopathy.  Musculoskeletal: Full ROM all peripheral extremities, joint stability, 5/5 strength, and normal gait. Skin: Warm and dry without rashes, lesions, cyanosis, clubbing or  ecchymosis.  Neuro: Cranial nerves intact, reflexes equal bilaterally. Normal muscle tone, no cerebellar symptoms. Sensation decreased in a stocking distribution from the ankles to touch, vibratory and Monofilament to the toes bilaterally. Pysch: Alert and oriented X 3, normal affect, Insight and Judgment appropriate.   Assessment and Plan  1. Annual Preventative Screening Examination  2. Essential hypertension  - EKG 12-Lead - POC Hemoccult Bld/Stl  - Urinalysis, Routine w reflex microscopic - Microalbumin / creatinine urine ratio - CBC with Differential/Platelet - BASIC METABOLIC PANEL WITH  GFR - Magnesium - TSH  3. Hyperlipidemia, mixed  - EKG 12-Lead - Hepatic function panel - Lipid panel - TSH  4. Type 2 diabetes mellitus with stage 4 chronic kidney disease, without long-term current use of insulin (HCC)  - EKG 12-Lead - HM DIABETES FOOT EXAM - LOW EXTREMITY NEUR EXAM DOCUM - Hemoglobin A1c - Insulin, random  5. Vitamin D deficiency  - VITAMIN D 25 Hydroxy  6. Hypothyroidism, u  - TSH  7. Type II diabetes mellitus with neurological manifestations (HCC)  - EKG 12-Lead  8. Seizure disorder ([place])   9. Gastroesophageal reflux disease, esophagitis presence not specified   10. Class 2 severe obesity due to excess calories with serious comorbidity and body mass index (BMI) of  37.0 to 37.9 in adult ([place])   11. Screening for rectal cancer  - POC Hemoccult Bld/Stl (3-Cd Home Screen); Future  12. Screening for ischemic heart disease  - EKG 12-Lead  13. Cellulitis of second toe, left  - doxycycline (VIBRAMYCIN) 100 MG capsule; Take 1 capsule daily on a full stomach for skin infection  Dispense: 30 capsule; Refill: 0  14. Medication management  - Urinalysis, Routine w reflex microscopic - Microalbumin / creatinine urine ratio - CBC with Differential/Platelet - BASIC METABOLIC PANEL WITH GFR - Hepatic function panel - Magnesium - Lipid panel - TSH - Hemoglobin A1c - Insulin, random - VITAMIN D 25 Hydroxy  - Levetiracetam level       Patient was counseled in prudent diet to achieve/maintain BMI less than 25 for weight control, BP monitoring, regular exercise and medications. Discussed med's effects and SE's. Screening labs and tests as requested with regular follow-up as recommended. Over 40 minutes of exam, counseling, chart review and high complex critical decision making was performed.

## 2016-07-22 NOTE — Patient Instructions (Signed)

## 2016-07-23 LAB — HEPATIC FUNCTION PANEL
ALT: 25 U/L (ref 6–29)
AST: 16 U/L (ref 10–35)
Albumin: 4.5 g/dL (ref 3.6–5.1)
Alkaline Phosphatase: 41 U/L (ref 33–130)
Bilirubin, Direct: 0.1 mg/dL (ref <=0.2)
Indirect Bilirubin: 0.3 mg/dL (ref 0.2–1.2)
Total Bilirubin: 0.4 mg/dL (ref 0.2–1.2)
Total Protein: 7.2 g/dL (ref 6.1–8.1)

## 2016-07-23 LAB — LIPID PANEL
Cholesterol: 144 mg/dL (ref <200)
HDL: 24 mg/dL — ABNORMAL LOW (ref >50)
LDL Cholesterol: 72 mg/dL (ref <100)
Total CHOL/HDL Ratio: 6 Ratio — ABNORMAL HIGH (ref <5.0)
Triglycerides: 240 mg/dL — ABNORMAL HIGH (ref <150)
VLDL: 48 mg/dL — ABNORMAL HIGH (ref <30)

## 2016-07-23 LAB — URINALYSIS, ROUTINE W REFLEX MICROSCOPIC
Bilirubin Urine: NEGATIVE
Glucose, UA: NEGATIVE
Hgb urine dipstick: NEGATIVE
Ketones, ur: NEGATIVE
Nitrite: NEGATIVE
Protein, ur: NEGATIVE
Specific Gravity, Urine: 1.009 (ref 1.001–1.035)
pH: 5.5 (ref 5.0–8.0)

## 2016-07-23 LAB — BASIC METABOLIC PANEL WITH GFR
BUN: 32 mg/dL — ABNORMAL HIGH (ref 7–25)
CO2: 23 mmol/L (ref 20–31)
Calcium: 10.8 mg/dL — ABNORMAL HIGH (ref 8.6–10.4)
Chloride: 106 mmol/L (ref 98–110)
Creat: 1.84 mg/dL — ABNORMAL HIGH (ref 0.60–0.88)
GFR, Est African American: 29 mL/min — ABNORMAL LOW (ref >=60)
GFR, Est Non African American: 25 mL/min — ABNORMAL LOW (ref >=60)
Glucose, Bld: 142 mg/dL — ABNORMAL HIGH (ref 65–99)
Potassium: 4.5 mmol/L (ref 3.5–5.3)
Sodium: 141 mmol/L (ref 135–146)

## 2016-07-23 LAB — URINALYSIS, MICROSCOPIC ONLY
Bacteria, UA: NONE SEEN [HPF]
Casts: NONE SEEN [LPF]
Crystals: NONE SEEN [HPF]
RBC / HPF: NONE SEEN RBC/HPF (ref <=2)
Squamous Epithelial / LPF: NONE SEEN [HPF] (ref <=5)
Yeast: NONE SEEN [HPF]

## 2016-07-23 LAB — HEMOGLOBIN A1C
Hgb A1c MFr Bld: 6.4 % — ABNORMAL HIGH (ref <5.7)
Mean Plasma Glucose: 137 mg/dL

## 2016-07-23 LAB — VITAMIN D 25 HYDROXY (VIT D DEFICIENCY, FRACTURES): Vit D, 25-Hydroxy: 96 ng/mL (ref 30–100)

## 2016-07-23 LAB — MICROALBUMIN / CREATININE URINE RATIO
Creatinine, Urine: 23 mg/dL (ref 20–320)
Microalb Creat Ratio: 26 mcg/mg creat (ref <30)
Microalb, Ur: 0.6 mg/dL

## 2016-07-23 LAB — MAGNESIUM: Magnesium: 2.2 mg/dL (ref 1.5–2.5)

## 2016-07-23 LAB — INSULIN, RANDOM: Insulin: 34 u[IU]/mL — ABNORMAL HIGH (ref 2.0–19.6)

## 2016-07-26 LAB — LEVETIRACETAM LEVEL: Keppra (Levetiracetam): 37.6 ug/mL

## 2016-07-29 ENCOUNTER — Ambulatory Visit
Admission: RE | Admit: 2016-07-29 | Discharge: 2016-07-29 | Disposition: A | Discharge status: Home / Self Care | Payer: Medicare Other | Source: Ambulatory Visit | Attending: Internal Medicine | Admitting: Internal Medicine

## 2016-07-29 DIAGNOSIS — Z1231 Encounter for screening mammogram for malignant neoplasm of breast: Secondary | ICD-10-CM

## 2016-08-19 ENCOUNTER — Other Ambulatory Visit: Payer: Self-pay | Admitting: *Deleted

## 2016-08-19 MED ORDER — FREESTYLE LANCETS MISC: 1 refills | Status: DC

## 2016-08-28 ENCOUNTER — Other Ambulatory Visit: Payer: Self-pay

## 2016-08-28 ENCOUNTER — Other Ambulatory Visit: Payer: Self-pay | Admitting: *Deleted

## 2016-08-28 DIAGNOSIS — I10 Essential (primary) hypertension: Secondary | ICD-10-CM

## 2016-08-28 DIAGNOSIS — Z1212 Encounter for screening for malignant neoplasm of rectum: Secondary | ICD-10-CM

## 2016-08-28 DIAGNOSIS — E1122 Type 2 diabetes mellitus with diabetic chronic kidney disease: Secondary | ICD-10-CM

## 2016-08-28 DIAGNOSIS — N183 Chronic kidney disease, stage 3 unspecified: Secondary | ICD-10-CM

## 2016-08-28 LAB — POC HEMOCCULT BLD/STL (HOME/3-CARD/SCREEN)
Card #2 Fecal Occult Blod, POC: NEGATIVE
Card #3 Fecal Occult Blood, POC: NEGATIVE
Fecal Occult Blood, POC: NEGATIVE

## 2016-08-28 MED ORDER — METFORMIN HCL ER 500 MG PO TB24: ORAL_TABLET | ORAL | 1 refills | Status: DC

## 2016-08-31 ENCOUNTER — Other Ambulatory Visit: Payer: Self-pay | Admitting: Internal Medicine

## 2016-08-31 MED ORDER — LEVETIRACETAM 500 MG PO TABS: ORAL_TABLET | ORAL | 3 refills | Status: DC

## 2016-09-19 ENCOUNTER — Encounter: Payer: Self-pay | Admitting: Internal Medicine

## 2016-09-20 ENCOUNTER — Other Ambulatory Visit: Payer: Self-pay

## 2016-09-20 DIAGNOSIS — L97511 Non-pressure chronic ulcer of other part of right foot limited to breakdown of skin: Secondary | ICD-10-CM

## 2016-09-20 MED ORDER — FUROSEMIDE 40 MG PO TABS: 40.0000 mg | ORAL_TABLET | Freq: Every day | ORAL | 1 refills | Status: DC

## 2016-10-03 ENCOUNTER — Telehealth: Payer: Self-pay | Admitting: Internal Medicine

## 2016-10-03 NOTE — Telephone Encounter (Signed)
[facility], mobile dermatology group that visits patients ass[facility] living community, called to request medical referral for body check, per patient's request. To be seen by NP, Rickard Rhymes, 10-08-16. If recommended, please advise to fax  referral. Fax: [phone].

## 2016-10-03 NOTE — Telephone Encounter (Signed)
Thing is on your desk

## 2016-10-07 ENCOUNTER — Other Ambulatory Visit: Payer: Self-pay | Admitting: *Deleted

## 2016-10-07 MED ORDER — GLUCOSE BLOOD VI STRP: ORAL_STRIP | 1 refills | Status: DC

## 2016-10-10 ENCOUNTER — Other Ambulatory Visit: Payer: Self-pay | Admitting: *Deleted

## 2016-10-10 MED ORDER — ATENOLOL 50 MG PO TABS: 50.0000 mg | ORAL_TABLET | Freq: Every day | ORAL | 1 refills | Status: DC

## 2016-10-24 NOTE — Progress Notes (Signed)
MEDICARE ANNUAL WELLNESS VISIT AND FOLLOW UP  Assessment:    Essential hypertension - continue medications, DASH diet, exercise and monitor at home. Call if greater than 130/80.  -     CBC with Differential/Platelet -     BASIC METABOLIC PANEL WITH GFR -     Hepatic function panel -     TSH  Type 2 diabetes mellitus with stage 3 chronic kidney disease, without long-term current use of insulin (HCC) Discussed general issues about diabetes pathophysiology and management., Educational material distributed., Suggested low cholesterol diet., Encouraged aerobic exercise., Discussed foot care., Reminded to get yearly retinal exam. -     Hemoglobin A1c  Type II diabetes mellitus with neurological manifestations ([place]) Discussed general issues about diabetes pathophysiology and management., Educational material distributed., Suggested low cholesterol diet., Encouraged aerobic exercise., Discussed foot care., Reminded to get yearly retinal exam. DISCUSSED HYPOGLYCEMIA AWARENESS, WILL CUT BACK ON AMARYL USE -     Hemoglobin A1c  Diabetic ulcer of toe of right foot associated with type 2 diabetes mellitus, unspecified ulcer stage ([place]) Discussed general issues about diabetes pathophysiology and management., Educational material distributed., Suggested low cholesterol diet., Encouraged aerobic exercise., Discussed foot care., Reminded to get yearly retinal exam. -     Hemoglobin A1c  Seizure disorder (HCC) -     Levetiracetam level  Meniere disorder, bilateral MONITOR  Vertigo of central origin, unspecified laterality MONITOR  Hyperlipidemia -continue medications, check lipids, decrease fatty foods, increase activity.  -     Lipid panel  Iron deficiency anemia, unspecified iron deficiency anemia type - monitor, continue iron supp with Vitamin C and increase green leafy veggies  Medication management -     Magnesium  Vitamin D deficiency Continue supplement  Morbid obesity (BMI 39.86)   (HCC) Morbid Obesity with co morbidities - long discussion about weight loss, diet, and exercise  Medicare annual wellness visit, initial 1 YEAR  BMI 39.86,   adult -recommended diet heavy in fruits and veggies and low in animal meats, cheeses, and dairy products  Venous (peripheral) insufficiency CONTINUE COMPRESSION STOCKINGS  OSA on CPAP Sleep apnea- continue CPAP, CPAP is helping with daytime fatigue, weight loss still advised.   Benign neoplasm of colon, unspecified part of colon  Hypothyroidism, unspecified type Hypothyroidism-check TSH level, continue medications the same, reminded to take on an empty stomach 30-64mins before food.  -     TSH  Gastroesophageal reflux disease, esophagitis presence not specified Continue PPI/H2 blocker, diet discussed  Advanced care counseling/discussion Discussed advanced care planning with patient Will get done at the assisted living and bring in copy  Need for vaccination for H flu type B -     Flu vaccine HIGH DOSE PF (Fluzone High dose)  Over 30 minutes of exam, counseling, chart review, and critical decision making was performed  Plan:   During the course of the visit the patient was educated and counseled about appropriate screening and preventive services including:    Pneumococcal vaccine   Influenza vaccine  Td vaccine  Prevnar 13  Screening electrocardiogram  Screening mammography  Bone densitometry screening  Colorectal cancer screening  Diabetes screening  Glaucoma screening  Nutrition counseling   Advanced directives: given info/requested copies.    Subjective:   [name] [name] is a 83 y.o. female who presents for Medicare Annual Wellness Visit and 3 month follow up on hypertension, prediabetes, hyperlipidemia, vitamin D def.   Her blood pressure has been controlled at home, lives at [facility] and  BP checked there tuesday, today their BP is BP: 140/76. Son drives her, she has not driven in  several years.  She does workout. She denies chest pain, shortness of breath, dizziness.  Has been following with derm, has had BCC removed from face recently, has follow up.  She is on cholesterol medication and denies myalgias. Her cholesterol is at goal. The cholesterol last visit was:   Lab Results  Component Value Date   CHOL 144 07/22/2016   HDL 24 (L) 07/22/2016   LDLCALC 72 07/22/2016   TRIG 240 (H) 07/22/2016   CHOLHDL 6.0 (H) 07/22/2016   She has been working on diet and exercise for diabetes with CKD, she is on bASA, she checks her sugars, she is not on ACE/ARB due to CKD, she is onglyza and MF, she is on amaryl in the AM daily, very rare low sugars, and denies foot ulcerations, hyperglycemia, hypoglycemia , increased appetite, nausea, paresthesia of the feet, polydipsia, polyuria, visual disturbances, vomiting and weight loss. Last A1C in the office was:  Lab Results  Component Value Date   HGBA1C 6.4 (H) 07/22/2016   Last GFR Lab Results  Component Value Date   GFRAA 29 (L) 07/22/2016   Patient is on Vitamin D supplement. Lab Results  Component Value Date   VD25OH 96 07/22/2016     BMI is Body mass index is 36.81 kg/m., she is working on diet and exercise. Wt Readings from Last 3 Encounters:  10/28/16 198 lb (89.8 kg)  07/22/16 200 lb (90.7 kg)  06/17/16 205 lb 3.2 oz (93.1 kg)   She is on thyroid medication. Her medication was not changed last visit.   Lab Results  Component Value Date   TSH 1.22 07/22/2016  .  She is on Keppra due to history of seizure DO.  Medication Review Current Outpatient Prescriptions on File Prior to Visit  Medication Sig Dispense Refill  . acetaminophen (TYLENOL) 500 MG tablet Take 1,000 mg by mouth every 6 (six) hours as needed for moderate pain.    Marland Kitchen aspirin 81 MG tablet Take 81 mg by mouth daily.    Marland Kitchen atenolol (TENORMIN) 50 MG tablet Take 1 tablet (50 mg total) by mouth daily. 90 tablet 1  . Biotin 5000 MCG TABS Take 5,000  Units by mouth 2 (two) times daily.     . ferrous sulfate dried (SLOW FE) 160 (50 FE) MG TBCR Take 160 mg by mouth 2 (two) times daily.     . fish oil-omega-3 fatty acids 1000 MG capsule Take 2 g by mouth at bedtime.     . Flaxseed, Linseed, (FLAX SEED OIL) 1000 MG CAPS Take 1 capsule by mouth 3 (three) times daily.    . furosemide (LASIX) 40 MG tablet Take 1 tablet (40 mg total) by mouth daily. 90 tablet 1  . glimepiride (AMARYL) 4 MG tablet take 1 tablet by mouth once daily WITH BREAKFAST 90 tablet 1  . glucose blood (FREESTYLE INSULINX TEST) test strip Use 1 strip to check glucose once daily in the morning.DX-E11.22 100 each 1  . Lancets (FREESTYLE) lancets USE ONE  TO CHECK GLUCOSE ONCE DAILY IN THE MORNING DX-E11.9 100 each 1  . lansoprazole (PREVACID) 30 MG capsule TAKE 1 BY MOUTH DAILY 90 capsule 4  . levETIRAcetam (KEPPRA) 500 MG tablet TAKE 1 BY MOUTH 3 TIMES DAILY AFTER MEALS 270 tablet 3  . levothyroxine (SYNTHROID, LEVOTHROID) 100 MCG tablet Take 1 tablet (100 mcg total) by mouth daily. [place]  tablet 3  . loratadine (CLARITIN) 10 MG tablet Take 10 mg by mouth daily.    . magnesium oxide (MAG-OX) 400 MG tablet Take 400 mg by mouth 3 (three) times daily.     . meclizine (ANTIVERT) 25 MG tablet Take 1 tablet (25 mg total) by mouth daily. 90 tablet 3  . metFORMIN (GLUCOPHAGE-XR) 500 MG 24 hr tablet Take 2 tablets 2 x / day after meals 360 tablet 1  . potassium chloride SA (K-DUR,KLOR-CON) 20 MEQ tablet Take 1 tablet (20 mEq total) by mouth daily. 30 tablet 0  . saxagliptin HCl (ONGLYZA) 5 MG TABS tablet Take 1 tablet daily as directed for Diabetes 90 tablet 3  . vitamin B-12 (CYANOCOBALAMIN) 100 MCG tablet Take 50 mcg by mouth daily.    . vitamin C (ASCORBIC ACID) 500 MG tablet Take 500 mg by mouth daily.     No current facility-administered medications on file prior to visit.     Current Problems (verified) Patient Active Problem List   Diagnosis Date Noted  . Gluttony 07/22/2016  .  Diabetic foot ulcer ([place]) 01/22/2016  . Vertigo of central origin 11/20/2015  . Meniere disorder, bilateral 11/20/2015  . BMI 39.86,   adult 12/12/2014  . Morbid obesity (BMI 39.86)  ([place]) 12/12/2014  . Medicare annual wellness visit, initial 12/12/2014  . OSA on CPAP 08/08/2014  . Type II diabetes mellitus with neurological manifestations ([place]) 09/15/2013  . Hyperlipidemia 06/27/2013  . Vitamin D deficiency 06/27/2013  . Medication management 06/27/2013  . Seizure disorder ([place]) 11/04/2012  . Iron deficiency anemia 06/13/2008  . COLONIC POLYPS 06/09/2008  . Hypothyroidism 06/09/2008  . T2_NIDDM w/Stage 4 CKD (GFR 29 ml/min) 06/09/2008  . Venous (peripheral) insufficiency 06/09/2008  . GERD w/Hx of Esophageal Stricture 06/09/2008  . Essential hypertension 06/09/2008    Screening Tests Immunization History  Administered Date(s) Administered  . DTaP 03/18/2007  . Influenza, High Dose Seasonal PF 10/18/2013, 12/12/2014, 10/24/2015, 10/28/2016  . Influenza-Unspecified 11/18/2012  . Pneumococcal Conjugate-13 12/12/2014  . Pneumococcal Polysaccharide-23 09/16/2013  . Zoster 02/28/2005    Preventative care: Last colonoscopy: 2010 Last mammogram: 07/2016  DEXA: 2017 Echo 2015 Sleep study 2016  Prior vaccinations: TD or Tdap: 2009  Influenza: 2017  Pneumococcal: 2015 Prevnar13: 2016 Shingles/Zostavax: 2007  Names of Other Physician/Practitioners you currently use: 1. [facility]- here for primary care 2. Dr. Ellie Lunch, eye doctor, last visit 12/2015 3. Dr. Lovena Le, dentist, last visit 2017  Patient Care Team: Unk Pinto, MD as PCP - General (Internal Medicine) Rozetta Nunnery, MD as Consulting Physician (Otolaryngology) Teena Irani, MD as Consulting Physician (Gastroenterology) Marcy Panning, MD as Consulting Physician (Oncology)  Allergies Allergies  Allergen Reactions  . Ppd [Tuberculin Purified Protein Derivative]  Other (See Comments)    indurated    SURGICAL HISTORY She  has a past surgical history that includes Tonsillectomy (~ 1947); Wrist surgery (Left, ~ 1950); Cervical polypectomy; Hammer toe surgery (Left, 1980's); and Mohs surgery (20087). FAMILY HISTORY Her family history includes Alcohol abuse in her son; Diabetes in her son; Heart disease in her father and mother; Hypertension in her son and son; Ovarian cancer in her sister; Stroke in her mother. SOCIAL HISTORY She  reports that she quit smoking about 33 years ago. Her smoking use included Cigarettes. She has a 30.00 pack-year smoking history. She has never used smokeless tobacco. She reports that she drinks alcohol. She reports that she does not use drugs.  MEDICARE WELLNESS OBJECTIVES: Physical  activity: Current Exercise Habits: The patient does not participate in regular exercise at present Cardiac risk factors: Cardiac Risk Factors include: advanced age (>63men, >31 women);diabetes mellitus;dyslipidemia;hypertension;obesity (BMI >30kg/m2);sedentary lifestyle;family history of premature cardiovascular disease Depression/mood screen:   Depression screen [facility] 2/9 10/28/2016  Decreased Interest 0  Down, Depressed, Hopeless 0  PHQ - 2 Score 0    ADLs:  In your present state of health, do you have any difficulty performing the following activities: 10/28/2016 07/22/2016  Hearing? Y Y  Comment - has hearing aids  Vision? N N  Difficulty concentrating or making decisions? Y N  Walking or climbing stairs? Y N  Dressing or bathing? N N  Doing errands, shopping? N N  In the past six months, have you accidently leaked urine? N -  Some recent data might be hidden     Cognitive Testing  Alert? Yes  Normal Appearance?Yes  Oriented to person? Yes  Place? Yes   Time? Yes  Recall of three objects?  2/3  Can perform simple calculations? Yes  Displays appropriate judgment?Yes  Can read the correct time from a watch face?Yes  EOL planning: Does  Patient Have a Medical Advance Directive?: No Would patient like information on creating a medical advance directive?: No - Patient declined (wants to get done at the assisted living she is in)   Objective:   Today's Vitals   10/28/16 0848  BP: 140/76  Pulse: 84  Temp: 97.6 F (36.4 C)  Weight: 198 lb (89.8 kg)  Height: 5' 1.5" (1.562 m)   Body mass index is 36.81 kg/m.  General appearance: alert, no distress, WD/WN,  female HEENT: normocephalic, sclerae anicteric, TMs pearly, nares patent, no discharge or erythema, pharynx normal Oral cavity: MMM, no lesions Neck: supple, no lymphadenopathy, no thyromegaly, no masses Heart: RRR, normal S1, S2, 3/6 systolic murmur Lungs: CTA bilaterally, no wheezes, rhonchi, or rales Abdomen: +bs, soft, non tender, non distended, no masses, no hepatomegaly, no splenomegaly Musculoskeletal: nontender, no swelling, no obvious deformity Extremities: mild bilateral edema, no cyanosis, no clubbing Pulses: 2+ symmetric, upper and lower extremities, normal cap refill Neurological: alert, oriented x 3, CN2-12 intact, strength normal upper extremities and lower extremities, sensation decreased in stocking/glove distribution, no cerebellar signs, gait antalgic Psychiatric: normal affect, behavior normal, pleasant  Breast: defer Gyn: defer Rectal: defer   Medicare Attestation I have personally reviewed: The patient's medical and social history Their use of alcohol, tobacco or illicit drugs Their current medications and supplements The patient's functional ability including ADLs,fall risks, home safety risks, cognitive, and hearing and visual impairment Diet and physical activities Evidence for depression or mood disorders  The patient's weight, height, BMI, and visual acuity have been recorded in the chart.  I have made referrals, counseling, and provided education to the patient based on review of the above and I have provided the patient with a  written personalized care plan for preventive services.     Vicie Mutters, PA-C   10/28/2016

## 2016-10-28 ENCOUNTER — Ambulatory Visit: Payer: Self-pay | Admitting: Internal Medicine

## 2016-10-28 ENCOUNTER — Encounter: Payer: Self-pay | Admitting: Physician Assistant

## 2016-10-28 ENCOUNTER — Ambulatory Visit (INDEPENDENT_AMBULATORY_CARE_PROVIDER_SITE_OTHER): Payer: Medicare Other | Admitting: Physician Assistant

## 2016-10-28 VITALS — BP 140/76 | HR 84 | Temp 97.6°F | Ht 61.5 in | Wt 198.0 lb

## 2016-10-28 DIAGNOSIS — Z0001 Encounter for general adult medical examination with abnormal findings: Secondary | ICD-10-CM

## 2016-10-28 DIAGNOSIS — D509 Iron deficiency anemia, unspecified: Secondary | ICD-10-CM | POA: Diagnosis not present

## 2016-10-28 DIAGNOSIS — E782 Mixed hyperlipidemia: Secondary | ICD-10-CM | POA: Diagnosis not present

## 2016-10-28 DIAGNOSIS — E039 Hypothyroidism, unspecified: Secondary | ICD-10-CM

## 2016-10-28 DIAGNOSIS — N183 Chronic kidney disease, stage 3 (moderate): Secondary | ICD-10-CM

## 2016-10-28 DIAGNOSIS — Z7189 Other specified counseling: Secondary | ICD-10-CM

## 2016-10-28 DIAGNOSIS — R6889 Other general symptoms and signs: Secondary | ICD-10-CM

## 2016-10-28 DIAGNOSIS — E1122 Type 2 diabetes mellitus with diabetic chronic kidney disease: Secondary | ICD-10-CM

## 2016-10-28 DIAGNOSIS — G40909 Epilepsy, unspecified, not intractable, without status epilepticus: Secondary | ICD-10-CM | POA: Diagnosis not present

## 2016-10-28 DIAGNOSIS — H8149 Vertigo of central origin, unspecified ear: Secondary | ICD-10-CM | POA: Diagnosis not present

## 2016-10-28 DIAGNOSIS — H8103 Meniere's disease, bilateral: Secondary | ICD-10-CM | POA: Diagnosis not present

## 2016-10-28 DIAGNOSIS — Z23 Encounter for immunization: Secondary | ICD-10-CM

## 2016-10-28 DIAGNOSIS — E11621 Type 2 diabetes mellitus with foot ulcer: Secondary | ICD-10-CM

## 2016-10-28 DIAGNOSIS — H814 Vertigo of central origin: Secondary | ICD-10-CM

## 2016-10-28 DIAGNOSIS — E559 Vitamin D deficiency, unspecified: Secondary | ICD-10-CM

## 2016-10-28 DIAGNOSIS — I10 Essential (primary) hypertension: Secondary | ICD-10-CM | POA: Diagnosis not present

## 2016-10-28 DIAGNOSIS — E1149 Type 2 diabetes mellitus with other diabetic neurological complication: Secondary | ICD-10-CM

## 2016-10-28 DIAGNOSIS — Z0000 Encounter for general adult medical examination without abnormal findings: Secondary | ICD-10-CM

## 2016-10-28 DIAGNOSIS — Z6839 Body mass index (BMI) 39.0-39.9, adult: Secondary | ICD-10-CM

## 2016-10-28 DIAGNOSIS — G4733 Obstructive sleep apnea (adult) (pediatric): Secondary | ICD-10-CM

## 2016-10-28 DIAGNOSIS — Z79899 Other long term (current) drug therapy: Secondary | ICD-10-CM

## 2016-10-28 DIAGNOSIS — K219 Gastro-esophageal reflux disease without esophagitis: Secondary | ICD-10-CM

## 2016-10-28 DIAGNOSIS — Z9989 Dependence on other enabling machines and devices: Secondary | ICD-10-CM

## 2016-10-28 DIAGNOSIS — D126 Benign neoplasm of colon, unspecified: Secondary | ICD-10-CM

## 2016-10-28 DIAGNOSIS — I872 Venous insufficiency (chronic) (peripheral): Secondary | ICD-10-CM

## 2016-10-28 DIAGNOSIS — L97519 Non-pressure chronic ulcer of other part of right foot with unspecified severity: Secondary | ICD-10-CM

## 2016-10-28 NOTE — Patient Instructions (Signed)
Please be careful with glimepiride (Amaryl). This medication forces your blood sugar down no matter what it is starting at. Only take a whole pill if your sugar is above 120 in the morning. If at any time you start to have low blood sugars in the morning or during the day please stop this medication. Please never take this medication if you are sick or can not eat. A low blood sugar is much more dangerous than a high blood sugar. Your brain needs two things, sugar and oxygen.   GET HEALTH CARE POWER OF ATTORNEY AND LIVING WILL DONE BRING [place] A COPY  Hypoglycemia Hypoglycemia occurs when the level of sugar (glucose) in the blood is too low. Glucose is a type of sugar that provides the body's main source of energy. Certain hormones (insulin and glucagon) control the level of glucose in the blood. Insulin lowers blood glucose, and glucagon increases blood glucose. Hypoglycemia can result from having too much insulin in the bloodstream, or from not eating enough food that contains glucose. Hypoglycemia can happen in people who do or do not have diabetes. It can develop quickly, and it can be a medical emergency. What are the causes? Hypoglycemia occurs most often in people who have diabetes. If you have diabetes, hypoglycemia may be caused by:  Diabetes medicine.  Not eating enough, or not eating often enough.  Increased physical activity.  Drinking alcohol, especially when you have not eaten recently.  If you do not have diabetes, hypoglycemia may be caused by:  A tumor in the pancreas. The pancreas is the organ that makes insulin.  Not eating enough, or not eating for long periods at a time (fasting).  Severe infection or illness that affects the liver, heart, or kidneys.  Certain medicines.  You may also have reactive hypoglycemia. This condition causes hypoglycemia within 4 hours of eating a meal. This may occur after having stomach surgery. Sometimes, the cause of reactive hypoglycemia  is not known. What increases the risk? Hypoglycemia is more likely to develop in:  People who have diabetes and take medicines to lower blood glucose.  People who abuse alcohol.  People who have a severe illness.  What are the signs or symptoms? Hypoglycemia may not cause any symptoms. If you have symptoms, they may include:  Hunger.  Anxiety.  Sweating and feeling clammy.  Confusion.  Dizziness or feeling light-headed.  Sleepiness.  Nausea.  Increased heart rate.  Headache.  Blurry vision.  Seizure.  Nightmares.  Tingling or numbness around the mouth, lips, or tongue.  A change in speech.  Decreased ability to concentrate.  A change in coordination.  Restless sleep.  Tremors or shakes.  Fainting.  Irritability.  How is this diagnosed? Hypoglycemia is diagnosed with a blood test to measure your blood glucose level. This blood test is done while you are having symptoms. Your health care provider may also do a physical exam and review your medical history. If you do not have diabetes, other tests may be done to find the cause of your hypoglycemia. How is this treated? This condition can often be treated by immediately eating or drinking something that contains glucose, such as:  3-4 sugar tablets (glucose pills).  Glucose gel, 15-gram tube.  Fruit juice, 4 oz (120 mL).  Regular soda (not diet soda), 4 oz (120 mL).  Low-fat milk, 4 oz (120 mL).  Several pieces of hard candy.  Sugar or honey, 1 Tbsp.  Treating Hypoglycemia If You Have Diabetes  If you are alert and able to swallow safely, follow the 15:15 rule:  Take 15 grams of a rapid-acting carbohydrate. Rapid-acting options include: ? 1 tube of glucose gel. ? 3 glucose pills. ? 6-8 pieces of hard candy. ? 4 oz (120 mL) of fruit juice. ? 4 oz (120 ml) of regular (not diet) soda.  Check your blood glucose 15 minutes after you take the carbohydrate.  If the repeat blood glucose level  is still at or below 70 mg/dL (3.9 mmol/L), take 15 grams of a carbohydrate again.  If your blood glucose level does not increase above 70 mg/dL (3.9 mmol/L) after 3 tries, seek emergency medical care.  After your blood glucose level returns to normal, eat a meal or a snack within 1 hour.  Treating Severe Hypoglycemia Severe hypoglycemia is when your blood glucose level is at or below 54 mg/dL (3 mmol/L). Severe hypoglycemia is an emergency. Do not wait to see if the symptoms will go away. Get medical help right away. Call your local emergency services (911 in the [place] Do not drive yourself to the hospital. If you have severe hypoglycemia and you cannot eat or drink, you may need an injection of glucagon. A family member or close friend should learn how to check your blood glucose and how to give you a glucagon injection. Ask your health care provider if you need to have an emergency glucagon injection kit available. Severe hypoglycemia may need to be treated in a hospital. The treatment may include getting glucose through an IV tube. You may also need treatment for the cause of your hypoglycemia. Follow these instructions at home: General instructions  Avoid any diets that cause you to not eat enough food. Talk with your health care provider before you start any new diet.  Take over-the-counter and prescription medicines only as told by your health care provider.  Limit alcohol intake to no more than 1 drink per day for nonpregnant women and 2 drinks per day for men. One drink equals 12 oz of beer, 5 oz of wine, or 1 oz of hard liquor.  Keep all follow-up visits as told by your health care provider. This is important. If You Have Diabetes:   Make sure you know the symptoms of hypoglycemia.  Always have a rapid-acting carbohydrate snack with you to treat low blood sugar.  Follow your diabetes management plan, as told by your health care provider. Make sure you: ? Take your medicines as  directed. ? Follow your exercise plan. ? Follow your meal plan. Eat on time, and do not skip meals. ? Check your blood glucose as often as directed. Make sure to check your blood glucose before and after exercise. If you exercise longer or in a different way than usual, check your blood glucose more often. ? Follow your sick day plan whenever you cannot eat or drink normally. Make this plan in advance with your health care provider.  Share your diabetes management plan with people in your workplace, school, and household.  Check your urine for ketones when you are ill and as told by your health care provider.  Carry a medical alert card or wear medical alert jewelry. If You Have Reactive Hypoglycemia or Low Blood Sugar From Other Causes:  Monitor your blood glucose as told by your health care provider.  Follow instructions from your health care provider about eating or drinking restrictions. Contact a health care provider if:  You have problems keeping your blood glucose in  your target range.  You have frequent episodes of hypoglycemia. Get help right away if:  You continue to have hypoglycemia symptoms after eating or drinking something containing glucose.  Your blood glucose is at or below 54 mg/dL (3 mmol/L).  You have a seizure.  You faint. These symptoms may represent a serious problem that is an emergency. Do not wait to see if the symptoms will go away. Get medical help right away. Call your local emergency services (911 in the [place] Do not drive yourself to the hospital. This information is not intended to replace advice given to you by your health care provider. Make sure you discuss any questions you have with your health care provider. Document Released: 02/04/2005 Document Revised: 07/19/2015 Document Reviewed: 03/10/2015 Elsevier Interactive Patient Education  [employer].   Here is some information to help you keep your heart healthy: Move it! - Aim for 30  mins of activity every day. Take it slowly at first. Talk to [place] before starting any new exercise program.   Lose it.  -Body Mass Index (BMI) can indicate if you need to lose weight. A healthy range is 18.5-24.9. For a BMI calculator, go to [employer].com  Waist Management -Excess abdominal fat is a risk factor for heart disease, diabetes, asthma, stroke and more. Ideal waist circumference is less than 35" for women and less than 40" for men.   Eat Right -focus on fruits, vegetables, whole grains, and meals you make yourself. Avoid foods with trans fat and high sugar/sodium content.   Snooze or Snore? - Loud snoring can be a sign of sleep apnea, a significant risk factor for high blood pressure, heart attach, stroke, and heart arrhythmias.  Kick the habit -Quit Smoking! Avoid second hand smoke. A single cigarette raises your blood pressure for 20 mins and increases the risk of heart attack and stroke for the next 24 hours.   Are Aspirin and Supplements right for you? -Add ENTERIC COATED low dose 81 mg Aspirin daily OR can do every other day if you have easy bruising to protect your heart and head. As well as to reduce risk of Colon Cancer by 20 %, Skin Cancer by 26 % , Melanoma by 46% and Pancreatic cancer by 60%  Say "No to Stress -There may be little you can do about problems that cause stress. However, techniques such as long walks, meditation, and exercise can help you manage it.   Start Now! - Make changes one at a time and set reasonable goals to increase your likelihood of success.

## 2016-10-31 LAB — CBC WITH DIFFERENTIAL/PLATELET
Basophils Absolute: 37 cells/uL (ref 0–200)
Basophils Relative: 0.6 %
Eosinophils Absolute: 763 cells/uL — ABNORMAL HIGH (ref 15–500)
Eosinophils Relative: 12.3 %
HCT: 36.4 % (ref 35.0–45.0)
Hemoglobin: 12 g/dL (ref 11.7–15.5)
Lymphs Abs: 1203 cells/uL (ref 850–3900)
MCH: 28.9 pg (ref 27.0–33.0)
MCHC: 33 g/dL (ref 32.0–36.0)
MCV: 87.7 fL (ref 80.0–100.0)
MPV: 10.9 fL (ref 7.5–12.5)
Monocytes Relative: 9.4 %
Neutro Abs: 3615 cells/uL (ref 1500–7800)
Neutrophils Relative %: 58.3 %
Platelets: 297 10*3/uL (ref 140–400)
RBC: 4.15 10*6/uL (ref 3.80–5.10)
RDW: 15.4 % — ABNORMAL HIGH (ref 11.0–15.0)
Total Lymphocyte: 19.4 %
WBC mixed population: 583 cells/uL (ref 200–950)
WBC: 6.2 10*3/uL (ref 3.8–10.8)

## 2016-10-31 LAB — HEMOGLOBIN A1C
Hgb A1c MFr Bld: 6.4 % of total Hgb — ABNORMAL HIGH (ref <5.7)
Mean Plasma Glucose: 137 (calc)
eAG (mmol/L): 7.6 (calc)

## 2016-10-31 LAB — BASIC METABOLIC PANEL WITH GFR
BUN/Creatinine Ratio: 22 (calc) (ref 6–22)
BUN: 45 mg/dL — ABNORMAL HIGH (ref 7–25)
CO2: 26 mmol/L (ref 20–32)
Calcium: 12 mg/dL — ABNORMAL HIGH (ref 8.6–10.4)
Chloride: 104 mmol/L (ref 98–110)
Creat: 2.04 mg/dL — ABNORMAL HIGH (ref 0.60–0.88)
GFR, Est African American: 26 mL/min/{1.73_m2} — ABNORMAL LOW (ref >=60)
GFR, Est Non African American: 22 mL/min/{1.73_m2} — ABNORMAL LOW (ref >=60)
Glucose, Bld: 137 mg/dL — ABNORMAL HIGH (ref 65–99)
Potassium: 4.2 mmol/L (ref 3.5–5.3)
Sodium: 141 mmol/L (ref 135–146)

## 2016-10-31 LAB — LIPID PANEL
Cholesterol: 148 mg/dL (ref <200)
HDL: 24 mg/dL — ABNORMAL LOW (ref >50)
LDL Cholesterol (Calc): 91 mg/dL (calc)
Non-HDL Cholesterol (Calc): 124 mg/dL (calc) (ref <130)
Total CHOL/HDL Ratio: 6.2 (calc) — ABNORMAL HIGH (ref <5.0)
Triglycerides: 253 mg/dL — ABNORMAL HIGH (ref <150)

## 2016-10-31 LAB — HEPATIC FUNCTION PANEL
AG Ratio: 2 (calc) (ref 1.0–2.5)
ALT: 24 U/L (ref 6–29)
AST: 16 U/L (ref 10–35)
Albumin: 4.9 g/dL (ref 3.6–5.1)
Alkaline phosphatase (APISO): 42 U/L (ref 33–130)
Bilirubin, Direct: 0.1 mg/dL (ref 0.0–0.2)
Globulin: 2.4 g/dL (calc) (ref 1.9–3.7)
Indirect Bilirubin: 0.2 mg/dL (calc) (ref 0.2–1.2)
Total Bilirubin: 0.3 mg/dL (ref 0.2–1.2)
Total Protein: 7.3 g/dL (ref 6.1–8.1)

## 2016-10-31 LAB — MAGNESIUM: Magnesium: 2.5 mg/dL (ref 1.5–2.5)

## 2016-10-31 LAB — TSH: TSH: 1.19 mIU/L (ref 0.40–4.50)

## 2016-10-31 LAB — LEVETIRACETAM LEVEL: Keppra (Levetiracetam): 42.7 ug/mL

## 2016-11-13 ENCOUNTER — Encounter: Payer: Self-pay | Admitting: Podiatry

## 2016-11-13 ENCOUNTER — Ambulatory Visit (INDEPENDENT_AMBULATORY_CARE_PROVIDER_SITE_OTHER): Payer: Medicare Other | Admitting: Podiatry

## 2016-11-13 DIAGNOSIS — E1140 Type 2 diabetes mellitus with diabetic neuropathy, unspecified: Secondary | ICD-10-CM

## 2016-11-13 DIAGNOSIS — B351 Tinea unguium: Secondary | ICD-10-CM | POA: Diagnosis not present

## 2016-11-13 DIAGNOSIS — M2012 Hallux valgus (acquired), left foot: Secondary | ICD-10-CM

## 2016-11-13 DIAGNOSIS — M79674 Pain in right toe(s): Secondary | ICD-10-CM | POA: Diagnosis not present

## 2016-11-13 DIAGNOSIS — M2011 Hallux valgus (acquired), right foot: Secondary | ICD-10-CM

## 2016-11-13 DIAGNOSIS — E1149 Type 2 diabetes mellitus with other diabetic neurological complication: Secondary | ICD-10-CM

## 2016-11-13 DIAGNOSIS — L98492 Non-pressure chronic ulcer of skin of other sites with fat layer exposed: Secondary | ICD-10-CM

## 2016-11-13 NOTE — Progress Notes (Addendum)
Complaint:  Visit Type: Patient returns to my office for continued preventative foot care services. Complaint: Patient states" my nails have grown long and thick and become painful to walk and wear shoes" Patient has been diagnosed with DM with neuropathy. The patient presents for preventative foot care services. No changes to ROS.  Patient says her ulcer healed in July and has been continue to heal since.  Podiatric Exam: Vascular: dorsalis pedis and posterior tibial pulses are palpable bilateral. Capillary return is immediate. Temperature gradient is WNL. Skin turgor WNL  Sensorium: Diminished  Semmes Weinstein monofilament test. Normal tactile sensation bilaterally. Nail Exam: Pt has thick disfigured discolored nails with subungual debris noted bilateral entire nail hallux through fifth toenails Ulcer Exam: There is no evidence of ulcer or pre-ulcerative changes or infection. Orthopedic Exam: Muscle tone and strength are WNL. No limitations in general ROM. No crepitus or effusions noted. Foot type and digits show no abnormalities. HAV  B/L Skin: No Porokeratosis. No infection or ulcers.  S/P ulcer 5th MPJ right foot  Diagnosis:  Onychomycosis, , Pain in right toe, pain in left toes  Treatment & Plan Procedures and Treatment: Consent by patient was obtained for treatment procedures.   Debridement of mycotic and hypertrophic toenails, 1 through 5 bilateral and clearing of subungual debris. No ulceration, no infection noted. Initiate diabetic footgear for DPN  HAV and s/p ulcer right foot. Return Visit-Office Procedure: Patient instructed to return to the office for a follow up visit 3 months for continued evaluation and treatment.    Gardiner Barefoot DPM

## 2016-12-02 ENCOUNTER — Other Ambulatory Visit: Payer: Self-pay | Admitting: Internal Medicine

## 2016-12-02 ENCOUNTER — Encounter: Payer: Self-pay | Admitting: Internal Medicine

## 2016-12-02 ENCOUNTER — Ambulatory Visit (INDEPENDENT_AMBULATORY_CARE_PROVIDER_SITE_OTHER): Payer: Medicare Other | Admitting: Internal Medicine

## 2016-12-02 VITALS — BP 140/66 | HR 92 | Temp 97.3°F | Resp 18 | Ht 61.5 in | Wt 200.0 lb

## 2016-12-02 DIAGNOSIS — M2010 Hallux valgus (acquired), unspecified foot: Secondary | ICD-10-CM | POA: Diagnosis not present

## 2016-12-02 DIAGNOSIS — Z79899 Other long term (current) drug therapy: Secondary | ICD-10-CM | POA: Diagnosis not present

## 2016-12-02 DIAGNOSIS — L97519 Non-pressure chronic ulcer of other part of right foot with unspecified severity: Secondary | ICD-10-CM | POA: Diagnosis not present

## 2016-12-02 DIAGNOSIS — L03011 Cellulitis of right finger: Secondary | ICD-10-CM

## 2016-12-02 DIAGNOSIS — E1122 Type 2 diabetes mellitus with diabetic chronic kidney disease: Secondary | ICD-10-CM | POA: Diagnosis not present

## 2016-12-02 DIAGNOSIS — M21619 Bunion of unspecified foot: Secondary | ICD-10-CM

## 2016-12-02 DIAGNOSIS — C4491 Basal cell carcinoma of skin, unspecified: Secondary | ICD-10-CM | POA: Diagnosis not present

## 2016-12-02 DIAGNOSIS — E559 Vitamin D deficiency, unspecified: Secondary | ICD-10-CM

## 2016-12-02 DIAGNOSIS — I10 Essential (primary) hypertension: Secondary | ICD-10-CM | POA: Diagnosis not present

## 2016-12-02 DIAGNOSIS — E782 Mixed hyperlipidemia: Secondary | ICD-10-CM

## 2016-12-02 DIAGNOSIS — N184 Chronic kidney disease, stage 4 (severe): Secondary | ICD-10-CM | POA: Diagnosis not present

## 2016-12-02 DIAGNOSIS — E11621 Type 2 diabetes mellitus with foot ulcer: Secondary | ICD-10-CM

## 2016-12-02 MED ORDER — CEPHALEXIN 500 MG PO CAPS: ORAL_CAPSULE | ORAL | 0 refills | Status: DC

## 2016-12-02 NOTE — Patient Instructions (Signed)

## 2016-12-02 NOTE — Progress Notes (Signed)
This very nice 83 y.o. [name] [name] with Hypertension, Hyperlipidemia, T2_NIDDM and Vitamin D Deficiency. She presents for evaluation of an apparently infected Rt 1st or Index finger after "jamming" it on 10.05.2018.  She also for presents for recheck of a diabetic foot ulcer of the Rt Metataresal area.     Patient is treated for HTN & BP has been controlled at home. Today's BP is at goal -  140/66. Patient has had no complaints of any cardiac type chest pain, palpitations, dyspnea / orthopnea / PND, dizziness, claudication, or dependent edema.     Hyperlipidemia is controlled with diet & meds. Patient denies myalgias or other med SE's. Last Lipids were at goal albeit elevated Trig's: Lab Results  Component Value Date   CHOL 148 10/28/2016   HDL 24 (L) 10/28/2016   LDLCALC 72 07/22/2016   TRIG 253 (H) 10/28/2016   CHOLHDL 6.2 (H) 10/28/2016      Also, the patient has history of T2_NIDDM w/CKD4 and has had no symptoms of reactive hypoglycemia, diabetic polys, paresthesias or visual blurring.  Recent labs in Sept showed increased BUN from 32 -> 45 / Creat from 1.84 ->2.04 with GFR drop from 25 _> 22 ml/min. Also was noted elevated calcium from 10.8 -> 12.0.  Last A1c was not at goal: Lab Results  Component Value Date   HGBA1C 6.4 (H) 10/28/2016      Further, the patient also has history of Vitamin D Deficiency and supplements vitamin D without any suspected side-effects. Last vitamin D was at goal:  Lab Results  Component Value Date   VD25OH 96 07/22/2016   Current Outpatient Prescriptions on File Prior to Visit  Medication Sig  . acetaminophen500 MG tablet Take 1,000 mg  every 6  hrs as needed  . aspirin 81 MG tablet Take  daily.  Marland Kitchen atenolol ) 50 MG tablet Take 1 tabl daily.  . Biotin 5000 MCG TABS Take  2 x daily.   Marland Kitchen SLOW FE 160 MG  Take  2 x daily.   . fish oil-omega-3  1000 MG  Take 2 g  at bedtime.   Marland Kitchen FLAX SEED OIL 1000 MG Take 1 cap 3 x daily.  . Furosemide 40 MG tablet Take 1 tab  daily.  Marland Kitchen glimepiride 4 MG tablet take 1 tab once daily WITH BREAKFAST  . lansoprazole30 MG capsule TAKE  DAILY  . LevETIRAcetam / KEPPRA 500 MG  TAKE  3 x DAILY AFTER MEALS  . levothyroxine  100 MCG tablet Take 1 tab daily.  Marland Kitchen loratadine  10 MG tablet Take 1 daily.  . magnesium  400 MG tablet Take  3 x daily.   . meclizine  25 MG tablet Take 1 tab daily.  . metFORMIN-XR 500 MG  Take 2 tab 2 x / day after meals  . potassium chloride SA 20 MEQ  Take 1 tab daily.  . saxagliptin/ONGLYZA 5 MG  Take 1 tab daily   . vitamin B-12  100 MCG tablet Take  daily.  . vitamin C 500 MG tablet Take  daily.   Allergies  Allergen Reactions  . Ppd [Tuberculin Purified Protein Derivative] Other (See Comments)    indurated   PMHx:   Past Medical History:  Diagnosis Date  . Anemia, unspecified   . Arthritis    "knees; right shoulder" (09/15/2013)  . Basal cell carcinoma    "right upper outer lip"  . DM neuropathy, type II diabetes mellitus ([place])   .  Epilepsy ([place])   . GERD (gastroesophageal reflux disease)   . Gout   . High cholesterol   . Hypertension   . Hypothyroidism   . Meniere's disease   . Obesity (BMI 30-39.9)   . Other forms of epilepsy and recurrent seizures without mention of intractable epilepsy 11/04/2012   Non convulsive paroxysmal spells, responding to [facility], patient not driving.   . Pneumonia ~ 1943  . Skin cancer    "forehead; right hand"  . Type II or unspecified type diabetes mellitus without mention of complication, not stated as uncontrolled   . UTI (urinary tract infection)   . Vitamin D deficiency    Immunization History  Administered Date(s) Administered  . DTaP 03/18/2007  . Influenza, High Dose Seasonal PF 10/18/2013, 12/12/2014, 10/24/2015, 10/28/2016  . Influenza-Unspecified 11/18/2012  . Pneumococcal Conjugate-13 12/12/2014  . Pneumococcal Polysaccharide-23 09/16/2013  . Zoster 02/28/2005   Past Surgical History:  Procedure Laterality Date  . CERVICAL  POLYPECTOMY    . HAMMER TOE SURGERY Left 1980's  . MOHS SURGERY  20087   "right upper outer lip"  . TONSILLECTOMY  ~ 1947  . WRIST SURGERY Left ~ 1950   "ran arm thru window"   FHx:    Reviewed / unchanged  SHx:    Reviewed / unchanged  Systems Review:  Constitutional: Denies fever, chills, wt changes, headaches, insomnia, fatigue, night sweats, change in appetite. Eyes: Denies redness, blurred vision, diplopia, discharge, itchy, watery eyes.  ENT: Denies discharge, congestion, post nasal drip, epistaxis, sore throat, earache, hearing loss, dental pain, tinnitus, vertigo, sinus pain, snoring.  CV: Denies chest pain, palpitations, irregular heartbeat, syncope, dyspnea, diaphoresis, orthopnea, PND, claudication or edema. Respiratory: denies cough, dyspnea, DOE, pleurisy, hoarseness, laryngitis, wheezing.  Gastrointestinal: Denies dysphagia, odynophagia, heartburn, reflux, water brash, abdominal pain or cramps, nausea, vomiting, bloating, diarrhea, constipation, hematemesis, melena, hematochezia  or hemorrhoids. Genitourinary: Denies dysuria, frequency, urgency, nocturia, hesitancy, discharge, hematuria or flank pain. Musculoskeletal: Denies arthralgias, myalgias, stiffness, jt. swelling, pain, limping or strain/sprain.  Skin: Denies pruritus, rash, hives, warts, acne, eczema or change in skin lesion(s). Neuro: No weakness, tremor, incoordination, spasms, paresthesia or pain. Psychiatric: Denies confusion, memory loss or sensory loss. Endo: Denies change in weight, skin or hair change.  Heme/Lymph: No excessive bleeding, bruising or enlarged lymph nodes.  Physical Exam  BP 140/66   Pulse 92   Temp (!) 97.3 F (36.3 C)   Resp 18   Ht 5' 1.5" (1.562 m)   Wt 200 lb (90.7 kg)   BMI 37.18 kg/m   Appears well nourished, well groomed  and in no distress.  Eyes: PERRLA, EOMs, conjunctiva no swelling or erythema. Sinuses: No frontal/maxillary tenderness ENT/Mouth: EAC's clear, TM's nl  w/o erythema, bulging. Nares clear w/o erythema, swelling, exudates. Oropharynx clear without erythema or exudates. Oral hygiene is good. Tongue normal, non obstructing. Hearing intact.  Neck: Supple. Thyroid nl. Car 2+/2+ without bruits, nodes or JVD. Chest: Respirations nl with BS clear & equal w/o rales, rhonchi, wheezing or stridor.  Cor: Heart sounds normal w/ regular rate and rhythm without sig. murmurs, gallops, clicks or rubs. Pedal pulses 1+ with trace edema.  Abdomen: Soft, obese & bowel sounds normal. Non-tender w/o guarding, hernias or masses.  Lymphatics: Unremarkable.  Musculoskeletal: Bilat Hallux Valgus with flexed, deformed  & overlaping toes with bilat metatarsal bunions. No active ulcerations noted at present. Pes cavus.  Skin: Several scaly red areas of the face suspect for actinic keratoses or BCE/SSC.   Rt  index /1st finger is warm, erythematous and tender from the base to the tip of the finger. No paronychia, pustules or open lesions noted.  Neuro: Cranial nerves intact, reflexes equal bilaterally. Sensory testing finds Monofilament sl decreased over the feet with Vibratory intact.  Tendon reflexes absent at knees and ankles.  Pysch: Alert & oriented x 3.   Assessment and Plan:  1. Essential hypertension  - Continue medication, monitor blood pressure at home.  - Continue DASH diet. Reminder to go to the ER if any CP,  SOB, nausea, dizziness, severe HA, changes vision/speech. - BASIC METABOLIC PANEL WITH GFR  2. Hyperlipidemia, mixed  - Continue diet/meds, exercise,& lifestyle modifications.  - Continue monitor periodic cholesterol/liver & renal functions   3. Type 2 diabetes mellitus with stage 4 chronic kidney disease, without long-term current use of insulin (HCC)  - Continue diet, exercise, lifestyle modifications.  - Monitor appropriate labs.  - BASIC METABOLIC PANEL WITH GFR  4. Vitamin D deficiency  - Continue supplementation.  5. Diabetic ulcer of  toe of right foot associated with type 2 diabetes mellitus, unspecified ulcer stage ([place])  6. Hypercalcemia  - PTH, intact and calcium  7. Cellulitis of right index finger  - Rx Keflex 500 mg #60  Sig: 1 cap 4 x/ day meals & Hs   8. Hallux valgus with bunions, Bilateral  Recc Orthotics   9. BCE (basal cell epithelioma)  - Ambulatory referral to Dermatology  10. Medication management        Discussed  regular exercise, BP monitoring, weight control to achieve/maintain BMI less than 25 and discussed med and SE's. Recommended labs to assess and monitor clinical status with further disposition pending results of labs. Over 30 minutes of exam, counseling, chart review was performed.

## 2016-12-03 LAB — BASIC METABOLIC PANEL WITH GFR
BUN/Creatinine Ratio: 18 (calc) (ref 6–22)
BUN: 35 mg/dL — ABNORMAL HIGH (ref 7–25)
CO2: 26 mmol/L (ref 20–32)
Calcium: 10.5 mg/dL — ABNORMAL HIGH (ref 8.6–10.4)
Chloride: 105 mmol/L (ref 98–110)
Creat: 1.95 mg/dL — ABNORMAL HIGH (ref 0.60–0.88)
GFR, Est African American: 27 mL/min/{1.73_m2} — ABNORMAL LOW (ref >=60)
GFR, Est Non African American: 24 mL/min/{1.73_m2} — ABNORMAL LOW (ref >=60)
Glucose, Bld: 184 mg/dL — ABNORMAL HIGH (ref 65–99)
Potassium: 4.9 mmol/L (ref 3.5–5.3)
Sodium: 140 mmol/L (ref 135–146)

## 2016-12-03 LAB — PTH, INTACT AND CALCIUM
Calcium: 10.5 mg/dL — ABNORMAL HIGH (ref 8.6–10.4)
PTH: 56 pg/mL (ref 14–64)

## 2016-12-10 ENCOUNTER — Ambulatory Visit (INDEPENDENT_AMBULATORY_CARE_PROVIDER_SITE_OTHER): Payer: Medicare Other | Admitting: Internal Medicine

## 2016-12-10 ENCOUNTER — Encounter: Payer: Self-pay | Admitting: Internal Medicine

## 2016-12-10 VITALS — BP 126/76 | HR 72 | Temp 97.2°F | Resp 16 | Ht 61.5 in | Wt 198.6 lb

## 2016-12-10 DIAGNOSIS — N184 Chronic kidney disease, stage 4 (severe): Secondary | ICD-10-CM

## 2016-12-10 DIAGNOSIS — E1122 Type 2 diabetes mellitus with diabetic chronic kidney disease: Secondary | ICD-10-CM

## 2016-12-10 DIAGNOSIS — Z79899 Other long term (current) drug therapy: Secondary | ICD-10-CM | POA: Diagnosis not present

## 2016-12-10 NOTE — Progress Notes (Signed)
Subjective:    Patient ID: [name] [name], female    DOB: 09-01-1932, 83 y.o.   MRN: [id number]  HPI  This nice 83 yo WWF with T2_DM and CKD4 from 2001 has had decling kidney functions and presents today to discuss therapy . She is explained in light of persistent decline in renal functions that she should stop her Metformin. She's also on Onglyza and is advised that dose (5 mg) should also be tapered to 1/2 tablet . She desires to avoid insulin, but does have some experience drawing up insulin since her deceased husband was a insulin dependent Diabetic. In add'n she is on Amaryl (Glimipiride ) 4 mg qd.   Medication Sig  . acetaminophen  500 MG  Take 1,000 mg every 6  hrs as needed for moderate pain.  Marland Kitchen aspirin 81 MG  Take  daily.  Marland Kitchen atenolol  50 MG  Take 1 tab daily.  . Biotin 5000 MCG  Take 5,000 Units  2 x daily.   Marland Kitchen fSLOW FE 160 MG  Take 160 mg by mouth 2 (two) times daily.   . fish oil-omega-3  1000 MG  Take 2 g by mouth at bedtime.   Marland Kitchen FLAX SEED OIL 1000 MG  Take 1 capsule by mouth 3 (three) times daily.  . furosemide 40 MG Take 1 tablet (40 mg total) by mouth daily.  Marland Kitchen glimepiride  4 MG take 1 tablet by mouth once daily WITH BREAKFAST  . lansoprazole30 MG  TAKE 1 BY MOUTH DAILY  . levETIRAcetam  500 MG  TAKE 1 BY MOUTH 3 TIMES DAILY AFTER MEALS  . Levothyroxine 100 MCG  Take 1 tablet (100 mcg total) by mouth daily.  Marland Kitchen loratadine  10 MG  Take 10 mg by mouth daily.  . magnesium  400 MG  Take 400 mg by mouth 3 (three) times daily.   . meclizine  25 MG  Take 1 tablet (25 mg total) by mouth daily.  . metFORMIN-XR 500 MG Take 2 tablets 2 x / day after meals  .  K-DUR 20 MEQ  Take 1 tablet (20 mEq total) by mouth daily.  . ONGLYZA 5 MG  Take 1 tablet daily as directed for Diabetes  . vitamin B-12 100 MCG tab Take 50 mcg by mouth daily.  . vitamin C  500 MG  Take 500 mg by mouth daily.   Allergen Reactions  . Ppd [Tuberculin Purified Protein Derivative] Other (See Comments)   indurated  Review of Systems    10 point systems review negative except as above.    Objective:   Physical Exam  BP 126/76   Pulse 72   Temp (!) 97.2 F (36.2 C)   Resp 16   Ht 5' 1.5" (1.562 m)   Wt 198 lb 9.6 oz (90.1 kg)   BMI 36.92 kg/m   HEENT - WNL. Neck - supple.  Chest - Clear equal BS. Cor - Nl HS. RRR w/o sig MGR. PP 1(+). No edema. MS- FROM w/o deformities.  Gait Nl. Neuro -  Nl w/o focal abnormalities.    Assessment & Plan:   1. Type 2 diabetes mellitus with stage 4 chronic kidney disease, without long-term current use of insulin ([place])  2. Medication management  - advised to d/c Metformin and 1/2 dose of Onglyza.  - Recommended to increase her Glimepiride 4 mg bid  - Discussed importance of stricter diet   - discussed glucose monitoring and call if CBG's  begin running over 200, in which case to anticipate initiating Insulin .   - advised to call if any questions at all   - Over 20 minutes of exam, counseling, chart review and complex critical decision making was performed

## 2016-12-11 ENCOUNTER — Telehealth: Payer: Self-pay | Admitting: *Deleted

## 2016-12-11 NOTE — Telephone Encounter (Signed)
Patient was informed to reduce her Onglyza dose to 1/2 tablet daily, per Dr Melford Aase.

## 2016-12-24 ENCOUNTER — Other Ambulatory Visit: Payer: Self-pay | Admitting: *Deleted

## 2016-12-24 MED ORDER — LEVOTHYROXINE SODIUM 100 MCG PO TABS: 100.0000 ug | ORAL_TABLET | Freq: Every day | ORAL | 3 refills | Status: DC

## 2016-12-25 ENCOUNTER — Ambulatory Visit (INDEPENDENT_AMBULATORY_CARE_PROVIDER_SITE_OTHER): Payer: Medicare Other | Admitting: Orthotics

## 2016-12-25 DIAGNOSIS — E1149 Type 2 diabetes mellitus with other diabetic neurological complication: Secondary | ICD-10-CM | POA: Diagnosis not present

## 2016-12-25 DIAGNOSIS — M2011 Hallux valgus (acquired), right foot: Secondary | ICD-10-CM

## 2016-12-25 DIAGNOSIS — E1140 Type 2 diabetes mellitus with diabetic neuropathy, unspecified: Secondary | ICD-10-CM

## 2016-12-25 DIAGNOSIS — L02619 Cutaneous abscess of unspecified foot: Secondary | ICD-10-CM

## 2016-12-25 DIAGNOSIS — L03119 Cellulitis of unspecified part of limb: Secondary | ICD-10-CM

## 2016-12-25 DIAGNOSIS — M2012 Hallux valgus (acquired), left foot: Secondary | ICD-10-CM | POA: Diagnosis not present

## 2016-12-25 DIAGNOSIS — L98492 Non-pressure chronic ulcer of skin of other sites with fat layer exposed: Secondary | ICD-10-CM

## 2016-12-29 NOTE — Progress Notes (Signed)

## 2016-12-31 ENCOUNTER — Other Ambulatory Visit: Payer: Self-pay

## 2016-12-31 DIAGNOSIS — E119 Type 2 diabetes mellitus without complications: Secondary | ICD-10-CM

## 2016-12-31 MED ORDER — GLIMEPIRIDE 4 MG PO TABS: ORAL_TABLET | ORAL | 1 refills | Status: DC

## 2017-01-28 ENCOUNTER — Other Ambulatory Visit: Payer: Self-pay | Admitting: *Deleted

## 2017-01-28 MED ORDER — SAXAGLIPTIN HCL 5 MG PO TABS: ORAL_TABLET | ORAL | 3 refills | Status: DC

## 2017-01-29 LAB — HM DIABETES EYE EXAM

## 2017-02-03 ENCOUNTER — Encounter: Payer: Self-pay | Admitting: Internal Medicine

## 2017-02-03 ENCOUNTER — Ambulatory Visit (INDEPENDENT_AMBULATORY_CARE_PROVIDER_SITE_OTHER): Payer: Medicare Other | Admitting: Internal Medicine

## 2017-02-03 VITALS — BP 132/66 | HR 76 | Temp 97.3°F | Resp 16 | Ht 61.5 in | Wt 199.6 lb

## 2017-02-03 DIAGNOSIS — E782 Mixed hyperlipidemia: Secondary | ICD-10-CM | POA: Diagnosis not present

## 2017-02-03 DIAGNOSIS — I10 Essential (primary) hypertension: Secondary | ICD-10-CM | POA: Diagnosis not present

## 2017-02-03 DIAGNOSIS — N184 Chronic kidney disease, stage 4 (severe): Secondary | ICD-10-CM

## 2017-02-03 DIAGNOSIS — E1122 Type 2 diabetes mellitus with diabetic chronic kidney disease: Secondary | ICD-10-CM

## 2017-02-03 DIAGNOSIS — R569 Unspecified convulsions: Secondary | ICD-10-CM

## 2017-02-03 DIAGNOSIS — E559 Vitamin D deficiency, unspecified: Secondary | ICD-10-CM

## 2017-02-03 DIAGNOSIS — Z79899 Other long term (current) drug therapy: Secondary | ICD-10-CM

## 2017-02-03 NOTE — Patient Instructions (Signed)

## 2017-02-03 NOTE — Progress Notes (Signed)
This very nice 83 y.o. WWF presents for 6 month follow up with Hypertension, Hyperlipidemia, Pre-Diabetes and Vitamin D Deficiency. Patient also has hx/o Seizure Disorder manifest with fugue states /automatisms controlled on Keppra and followed by Dr Brett Fairy.     Patient is treated for HTN & BP has been controlled at home. Today's BP is at goal  132/66. Patient has had no complaints of any cardiac type chest pain, palpitations, dyspnea / orthopnea / PND, dizziness, claudication, or dependent edema.     Hyperlipidemia is controlled with diet & meds. Patient denies myalgias or other med SE's. Last Lipids were at goal albeit elevated Trig's: Lab Results  Component Value Date   CHOL 148 10/28/2016   HDL 24 (L) 10/28/2016   LDLCALC 72 07/22/2016   TRIG 253 (H) 10/28/2016   CHOLHDL 6.2 (H) 10/28/2016      Also, the patient has history of T2_NIDDM (2001) w/CKD 4  (GFR 27 ml/min) and has had no symptoms of reactive hypoglycemia, diabetic polys or visual blurring, but does have numb tingling paresthesias of her hands and feet. Last A1c was not at goal: Lab Results  Component Value Date   HGBA1C 6.4 (H) 10/28/2016      Further, the patient also has history of Vitamin D Deficiency ("37" / 2008) and supplements vitamin D without any suspected side-effects. Last vitamin D was at goal: Lab Results  Component Value Date   VD25OH 96 07/22/2016   Current Outpatient Medications on File Prior to Visit  Medication Sig  . acetaminophen (TYLENOL) 500 MG tablet Take 1,000 mg by mouth every 6 (six) hours as needed for moderate pain.  Marland Kitchen aspirin 81 MG tablet Take 81 mg by mouth daily.  Marland Kitchen atenolol (TENORMIN) 50 MG tablet Take 1 tablet (50 mg total) by mouth daily.  . Biotin 5000 MCG TABS Take 5,000 Units by mouth 2 (two) times daily.   . ferrous sulfate dried (SLOW FE) 160 (50 FE) MG TBCR Take 160 mg by mouth 2 (two) times daily.   . fish oil-omega-3 fatty acids 1000 MG capsule Take 2 g by mouth at  bedtime.   . Flaxseed, Linseed, (FLAX SEED OIL) 1000 MG CAPS Take 1 capsule by mouth 3 (three) times daily.  . furosemide (LASIX) 40 MG tablet Take 1 tablet (40 mg total) by mouth daily.  Marland Kitchen glimepiride (AMARYL) 4 MG tablet take 1 tablet by mouth once daily WITH BREAKFAST  . glucose blood (FREESTYLE INSULINX TEST) test strip Use 1 strip to check glucose once daily in the morning.DX-E11.22  . Lancets (FREESTYLE) lancets USE ONE  TO CHECK GLUCOSE ONCE DAILY IN THE MORNING DX-E11.9  . lansoprazole (PREVACID) 30 MG capsule TAKE 1 BY MOUTH DAILY  . levETIRAcetam (KEPPRA) 500 MG tablet TAKE 1 BY MOUTH 3 TIMES DAILY AFTER MEALS  . levothyroxine (SYNTHROID, LEVOTHROID) 100 MCG tablet Take 1 tablet (100 mcg total) daily by mouth.  . loratadine (CLARITIN) 10 MG tablet Take 10 mg by mouth daily.  . magnesium oxide (MAG-OX) 400 MG tablet Take 400 mg by mouth 3 (three) times daily.   . meclizine (ANTIVERT) 25 MG tablet Take 1 tablet (25 mg total) by mouth daily.  Marland Kitchen OVER THE COUNTER MEDICATION Patient injects Humulin 70/30 30 units at 2 PM daily.  . potassium chloride SA (K-DUR,KLOR-CON) 20 MEQ tablet Take 1 tablet (20 mEq total) by mouth daily.  . saxagliptin HCl (ONGLYZA) 5 MG TABS tablet Take 1 tablet daily as directed for  Diabetes  . vitamin B-12 (CYANOCOBALAMIN) 100 MCG tablet Take 50 mcg by mouth daily.  . vitamin C (ASCORBIC ACID) 500 MG tablet Take 500 mg by mouth daily.   No current facility-administered medications on file prior to visit.    Allergies  Allergen Reactions  . Ppd [Tuberculin Purified Protein Derivative] Other (See Comments)    indurated   PMHx:   Past Medical History:  Diagnosis Date  . Anemia, unspecified   . Arthritis    "knees; right shoulder" (09/15/2013)  . Basal cell carcinoma    "right upper outer lip"  . DM neuropathy, type II diabetes mellitus ([place])   . Epilepsy ([place])   . GERD (gastroesophageal reflux disease)   . Gout   . High cholesterol   . Hypertension   .  Hypothyroidism   . Meniere's disease   . Obesity (BMI 30-39.9)   . Other forms of epilepsy and recurrent seizures without mention of intractable epilepsy 11/04/2012   Non convulsive paroxysmal spells, responding to [facility], patient not driving.   . Pneumonia ~ 1943  . Skin cancer    "forehead; right hand"  . Type II or unspecified type diabetes mellitus without mention of complication, not stated as uncontrolled   . UTI (urinary tract infection)   . Vitamin D deficiency    Immunization History  Administered Date(s) Administered  . DTaP 03/18/2007  . Influenza, High Dose Seasonal PF 10/18/2013, 12/12/2014, 10/24/2015, 10/28/2016  . Influenza-Unspecified 11/18/2012  . Pneumococcal Conjugate-13 12/12/2014  . Pneumococcal Polysaccharide-23 09/16/2013  . Zoster 02/28/2005   Past Surgical History:  Procedure Laterality Date  . CERVICAL POLYPECTOMY    . HAMMER TOE SURGERY Left 1980's  . MOHS SURGERY  20087   "right upper outer lip"  . TONSILLECTOMY  ~ 1947  . WRIST SURGERY Left ~ 1950   "ran arm thru window"   FHx:    Reviewed / unchanged  SHx:    Reviewed / unchanged  Systems Review:  Constitutional: Denies fever, chills, wt changes, headaches, insomnia, fatigue, night sweats, change in appetite. Eyes: Denies redness, blurred vision, diplopia, discharge, itchy, watery eyes.  ENT: Denies discharge, congestion, post nasal drip, epistaxis, sore throat, earache, hearing loss, dental pain, tinnitus, vertigo, sinus pain, snoring.  CV: Denies chest pain, palpitations, irregular heartbeat, syncope, dyspnea, diaphoresis, orthopnea, PND, claudication or edema. Respiratory: denies cough, dyspnea, DOE, pleurisy, hoarseness, laryngitis, wheezing.  Gastrointestinal: Denies dysphagia, odynophagia, heartburn, reflux, water brash, abdominal pain or cramps, nausea, vomiting, bloating, diarrhea, constipation, hematemesis, melena, hematochezia  or hemorrhoids. Genitourinary: Denies dysuria, frequency,  urgency, nocturia, hesitancy, discharge, hematuria or flank pain. Musculoskeletal: Denies arthralgias, myalgias, stiffness, jt. swelling, pain, limping or strain/sprain.  Skin: Denies pruritus, rash, hives, warts, acne, eczema or change in skin lesion(s). Neuro: No weakness, tremor, incoordination, spasms, paresthesia or pain. Psychiatric: Denies confusion, memory loss or sensory loss. Endo: Denies change in weight, skin or hair change.  Heme/Lymph: No excessive bleeding, bruising or enlarged lymph nodes.  Physical Exam  BP 132/66   Pulse 76   Temp (!) 97.3 F (36.3 C)   Resp 16   Ht 5' 1.5" (1.562 m)   Wt 199 lb 9.6 oz (90.5 kg)   BMI 37.10 kg/m   Appears over nourished, well groomed  and in no distress.  Eyes: PERRLA, EOMs, conjunctiva no swelling or erythema. Sinuses: No frontal/maxillary tenderness ENT/Mouth: EAC's clear, TM's nl w/o erythema, bulging. Nares clear w/o erythema, swelling, exudates. Oropharynx clear without erythema or exudates. Oral hygiene is good.  Tongue normal, non obstructing. Hearing intact.  Neck: Supple. Thyroid nl. Car 2+/2+ without bruits, nodes or JVD. Chest: Respirations nl with BS clear & equal w/o rales, rhonchi, wheezing or stridor.  Cor: Heart sounds normal w/ regular rate and rhythm without sig. murmurs, gallops, clicks or rubs. Peripheral pulses normal and equal  without edema.  Abdomen: Soft & bowel sounds normal. Non-tender w/o guarding, rebound, hernias, masses or organomegaly.  Lymphatics: Unremarkable.  Musculoskeletal: Full ROM all peripheral extremities, joint stability, 5/5 strength and normal gait.  Skin: Warm, dry without exposed rashes, lesions or ecchymosis apparent.  Neuro: Cranial nerves intact, reflexes equal bilaterally. Sensory-motor testing grossly intact. Tendon reflexes grossly intact.  Pysch: Alert & oriented x 3.  Insight and judgement nl & appropriate. No ideations.  Assessment and Plan:  1. Essential hypertension  -  Continue medication, monitor blood pressure at home.  - Continue DASH diet. Reminder to go to the ER if any CP,  SOB, nausea, dizziness, severe HA, changes vision/speech.  - BASIC METABOLIC PANEL WITH GFR - CBC with Differential/Platelet - Magnesium - TSH  2. Hyperlipidemia, mixed  - Continue diet/meds, exercise,& lifestyle modifications.  - Continue monitor periodic cholesterol/liver & renal functions   - Hepatic function panel - Lipid panel - TSH  3. Type 2 diabetes mellitus with stage 4 chronic kidney disease, without long-term current use of insulin (HCC)  - Advised d/c Sitagliptin A& Glimepiride and Increase her Relion (Novolin)  70/30 up from 30 units by 5 unit increments daily to keep FBGs < 150 mg%.  - Continue diet, exercise, lifestyle modifications.  - Monitor appropriate labs.  - Hemoglobin A1c  4. Vitamin D deficiency  - Continue supplementation.  - VITAMIN D 25 Hydroxy  5. Seizure ([place])  - Levetiracetam level  6. Medication management  - BASIC METABOLIC PANEL WITH GFR - CBC with Differential/Platelet - Hepatic function panel - Magnesium - Lipid panel - TSH - Hemoglobin A1c - VITAMIN D 25 Hydroxy  - Levetiracetam level      Discussed  regular exercise, BP monitoring, weight control to achieve/maintain BMI less than 25 and discussed med and SE's. Recommended labs to assess and monitor clinical status with further disposition pending results of labs. Over 30 minutes of exam, counseling, chart review was performed.

## 2017-02-06 LAB — BASIC METABOLIC PANEL WITH GFR
BUN/Creatinine Ratio: 18 (calc) (ref 6–22)
BUN: 36 mg/dL — ABNORMAL HIGH (ref 7–25)
CO2: 29 mmol/L (ref 20–32)
Calcium: 10.2 mg/dL (ref 8.6–10.4)
Chloride: 105 mmol/L (ref 98–110)
Creat: 1.99 mg/dL — ABNORMAL HIGH (ref 0.60–0.88)
GFR, Est African American: 26 mL/min/{1.73_m2} — ABNORMAL LOW (ref >=60)
GFR, Est Non African American: 23 mL/min/{1.73_m2} — ABNORMAL LOW (ref >=60)
Glucose, Bld: 218 mg/dL — ABNORMAL HIGH (ref 65–99)
Potassium: 4.6 mmol/L (ref 3.5–5.3)
Sodium: 142 mmol/L (ref 135–146)

## 2017-02-06 LAB — LIPID PANEL
Cholesterol: 161 mg/dL (ref <200)
HDL: 25 mg/dL — ABNORMAL LOW (ref >50)
LDL Cholesterol (Calc): 108 mg/dL (calc) — ABNORMAL HIGH
Non-HDL Cholesterol (Calc): 136 mg/dL (calc) — ABNORMAL HIGH (ref <130)
Total CHOL/HDL Ratio: 6.4 (calc) — ABNORMAL HIGH (ref <5.0)
Triglycerides: 164 mg/dL — ABNORMAL HIGH (ref <150)

## 2017-02-06 LAB — CBC WITH DIFFERENTIAL/PLATELET
Basophils Absolute: 38 cells/uL (ref 0–200)
Basophils Relative: 0.5 %
Eosinophils Absolute: 631 cells/uL — ABNORMAL HIGH (ref 15–500)
Eosinophils Relative: 8.3 %
HCT: 35.3 % (ref 35.0–45.0)
Hemoglobin: 11.5 g/dL — ABNORMAL LOW (ref 11.7–15.5)
Lymphs Abs: 1307 cells/uL (ref 850–3900)
MCH: 28.8 pg (ref 27.0–33.0)
MCHC: 32.6 g/dL (ref 32.0–36.0)
MCV: 88.5 fL (ref 80.0–100.0)
MPV: 10.5 fL (ref 7.5–12.5)
Monocytes Relative: 9.5 %
Neutro Abs: 4902 cells/uL (ref 1500–7800)
Neutrophils Relative %: 64.5 %
Platelets: 348 10*3/uL (ref 140–400)
RBC: 3.99 10*6/uL (ref 3.80–5.10)
RDW: 15.1 % — ABNORMAL HIGH (ref 11.0–15.0)
Total Lymphocyte: 17.2 %
WBC mixed population: 722 cells/uL (ref 200–950)
WBC: 7.6 10*3/uL (ref 3.8–10.8)

## 2017-02-06 LAB — HEPATIC FUNCTION PANEL
AG Ratio: 1.6 (calc) (ref 1.0–2.5)
ALT: 11 U/L (ref 6–29)
AST: 10 U/L (ref 10–35)
Albumin: 4.2 g/dL (ref 3.6–5.1)
Alkaline phosphatase (APISO): 56 U/L (ref 33–130)
Bilirubin, Direct: 0.1 mg/dL (ref 0.0–0.2)
Globulin: 2.6 g/dL (calc) (ref 1.9–3.7)
Indirect Bilirubin: 0.2 mg/dL (calc) (ref 0.2–1.2)
Total Bilirubin: 0.3 mg/dL (ref 0.2–1.2)
Total Protein: 6.8 g/dL (ref 6.1–8.1)

## 2017-02-06 LAB — LEVETIRACETAM LEVEL: Keppra (Levetiracetam): 44.1 ug/mL

## 2017-02-06 LAB — MAGNESIUM: Magnesium: 2.8 mg/dL — ABNORMAL HIGH (ref 1.5–2.5)

## 2017-02-06 LAB — VITAMIN D 25 HYDROXY (VIT D DEFICIENCY, FRACTURES): Vit D, 25-Hydroxy: 65 ng/mL (ref 30–100)

## 2017-02-06 LAB — HEMOGLOBIN A1C
Hgb A1c MFr Bld: 7.8 % of total Hgb — ABNORMAL HIGH (ref <5.7)
Mean Plasma Glucose: 177 (calc)
eAG (mmol/L): 9.8 (calc)

## 2017-02-06 LAB — TSH: TSH: 1.04 mIU/L (ref 0.40–4.50)

## 2017-02-12 ENCOUNTER — Ambulatory Visit (INDEPENDENT_AMBULATORY_CARE_PROVIDER_SITE_OTHER): Payer: Medicare Other | Admitting: Podiatry

## 2017-02-12 ENCOUNTER — Ambulatory Visit (INDEPENDENT_AMBULATORY_CARE_PROVIDER_SITE_OTHER): Payer: Medicare Other

## 2017-02-12 DIAGNOSIS — E1149 Type 2 diabetes mellitus with other diabetic neurological complication: Secondary | ICD-10-CM

## 2017-02-12 DIAGNOSIS — M2012 Hallux valgus (acquired), left foot: Secondary | ICD-10-CM | POA: Diagnosis not present

## 2017-02-12 DIAGNOSIS — B351 Tinea unguium: Secondary | ICD-10-CM | POA: Diagnosis not present

## 2017-02-12 DIAGNOSIS — M2011 Hallux valgus (acquired), right foot: Secondary | ICD-10-CM

## 2017-02-12 DIAGNOSIS — M79674 Pain in right toe(s): Secondary | ICD-10-CM | POA: Diagnosis not present

## 2017-02-12 DIAGNOSIS — E1140 Type 2 diabetes mellitus with diabetic neuropathy, unspecified: Secondary | ICD-10-CM | POA: Diagnosis not present

## 2017-02-12 DIAGNOSIS — M10371 Gout due to renal impairment, right ankle and foot: Secondary | ICD-10-CM | POA: Diagnosis not present

## 2017-02-12 NOTE — Progress Notes (Signed)
This patient presents to my office for continued preventative foot care services.  She also says that she has developed severe redness, swelling and pain through her big toe joint on her right foot.  She says that this is been painful for the last 3 days and she has been active cooking during the Christmas holiday.  She says that his causing severe pain and discomfort.  She says that she has taken Tylenol to help to relieve her pain, but it has been ineffective.  She also presents the office stating that the diabetic shoes that she picked up while working well but she is unable to wear them due to the swelling in her right foot today.  She admits to be in on a diuretic which she believes is Lasix.  She says that her medical doctor has been adjusting her diabetic medicine and has an appointment with him in April.  She also has kidney failure.  She presents the office for preventative foot care services and requested an evaluation of her painful right foot.     General Appearance  Alert, conversant and in no acute stress.  Vascular  Dorsalis pedis and posterior pulses are palpable  bilaterally.  Capillary return is within normal limits  bilaterally. Temperature is within normal limits  Bilaterally.  Neurologic  Senn-Weinstein monofilament wire test diminished  bilaterally. Muscle power within normal limits bilaterally.  Nails Thick disfigured discolored nails with subungual debris bilaterally from hallux to fifth toes bilaterally. No evidence of bacterial infection or drainage bilaterally.  Orthopedic  No limitations of motion of motion feet bilaterally.  No crepitus or effusions noted.  No bony pathology or digital deformities noted. Red, swollen, inflamed first MPJ of the right foot.  Swelling extends distally from the first MPJ as well as proximally.  Skin  normotropic skin with no porokeratosis noted bilaterally.  No signs of infections or ulcers noted.    Onychomycosis  B/L.  Diabetes with  neuropathy.  Kidney disease stage 4. gout  ROV  x-rays taken of her right foot do reveal significant breakdown of the big toe joint, right foot.  No evidence of any bony pathology noted in her right forefoot.  Patient was diagnosed as having gout in her right foot. Patient was treated with injection therapy in her right big toe joint.  Injection therapy using 1.0 cc. Of 2% xylocaine( 20 mg.) plus 1 cc. of kenalog-la ( 10 mg) plus 1/2 cc. of dexamethazone phosphate ( 2 mg).  Patient was not given any anti-inflammatory medication due to her kidneys.  She was not administered any cortisone by mouth. due to her diabetes.  She was told she needs to go home, rest, ice and elevate her right foot.  She is to return to the office in 2 weeks for further evaluation and treatment.  I did mention to this patient that if the problem persists, she may need to talk to her doctor to have him adjust her medication.   Gardiner Barefoot DPM

## 2017-02-25 ENCOUNTER — Ambulatory Visit (INDEPENDENT_AMBULATORY_CARE_PROVIDER_SITE_OTHER): Payer: Medicare Other | Admitting: Podiatry

## 2017-02-25 ENCOUNTER — Encounter: Payer: Self-pay | Admitting: Podiatry

## 2017-02-25 DIAGNOSIS — M10371 Gout due to renal impairment, right ankle and foot: Secondary | ICD-10-CM

## 2017-02-25 NOTE — Progress Notes (Signed)
This patient presents to the office following an  episode of gout in the big toe joint on the right foot about 2 weeks ago.this patient was found to have severe kidney disease, so I was hesitant on ordering cortisone by mouth.  She also is diabetic.  Due to her severe pain and swelling in the big toe joint of the right foot. I provided her with a injection of Xylocaine and Kenalog and dexamethasone phosphate.  She returns to the office today stating that she is almost 100% improved.  She does relate that she has  now started to have severe pain and swelling in the ankle of the left foot.  She says she has picked up her new diabetic shoes but has been unable to wear the shoes due to swelling that is present in her left foot.  She returns to the office for follow-up for the acute episode, but also requests an evaluation of her left ankle.  General Appearance  Alert, conversant and in no acute stress.  Vascular  Dorsalis pedis and posterior pulses are palpable  bilaterally.  Capillary return is within normal limits  bilaterally. Temperature is within normal limits  Bilaterally.  Neurologic  Senn-Weinstein monofilament wire test diminished  bilaterally. Muscle power within normal limits bilaterally.  Nails Thick disfigured discolored nails with subungual debris bilaterally from hallux to fifth toes bilaterally. No evidence of bacterial infection or drainage bilaterally.  Orthopedic  No limitations of motion of motion feet bilaterally.  No crepitus or effusions noted.  Severe HAV deformity 1st MPJ right greater than left foot.  There is desquamation noted to the right hallux.  Patient has pain-free range of motion of the 1st MPJ .  Her left ankle is swollen with the swelling extending from the ankle down to the toes left foot.    Skin  normotropic skin with no porokeratosis noted bilaterally.  No signs of infections or ulcers noted.   S/P gout right foot.  ROV  Patient has responded well to the local  injection therapy in the right foot.  She now states that she has pain and swelling that has localized into her left ankle. There  now seems to be an inflammatory process occurring in the left ankle, but it does not seem to be acute in nature.  Therefore, I recommended she make an appointment for a complete workup with one of the doctors on the medical side of the building.  RTC prn.   Gardiner Barefoot DPM

## 2017-02-28 ENCOUNTER — Other Ambulatory Visit: Payer: Self-pay

## 2017-02-28 DIAGNOSIS — L97511 Non-pressure chronic ulcer of other part of right foot limited to breakdown of skin: Secondary | ICD-10-CM

## 2017-02-28 MED ORDER — FUROSEMIDE 40 MG PO TABS
40.0000 mg | ORAL_TABLET | Freq: Every day | ORAL | 1 refills | Status: DC
Start: 2017-02-28 — End: 2017-05-19

## 2017-03-03 ENCOUNTER — Ambulatory Visit: Payer: Medicare Other | Admitting: Podiatry

## 2017-03-03 ENCOUNTER — Other Ambulatory Visit: Payer: Self-pay | Admitting: Podiatry

## 2017-03-03 ENCOUNTER — Ambulatory Visit (INDEPENDENT_AMBULATORY_CARE_PROVIDER_SITE_OTHER): Payer: Medicare Other

## 2017-03-03 DIAGNOSIS — M779 Enthesopathy, unspecified: Principal | ICD-10-CM

## 2017-03-03 DIAGNOSIS — M10471 Other secondary gout, right ankle and foot: Secondary | ICD-10-CM | POA: Diagnosis not present

## 2017-03-03 DIAGNOSIS — M778 Other enthesopathies, not elsewhere classified: Secondary | ICD-10-CM

## 2017-03-03 DIAGNOSIS — M7752 Other enthesopathy of left foot: Secondary | ICD-10-CM | POA: Diagnosis not present

## 2017-03-03 DIAGNOSIS — M25572 Pain in left ankle and joints of left foot: Secondary | ICD-10-CM

## 2017-03-03 MED ORDER — MELOXICAM 15 MG PO TABS: 15.0000 mg | ORAL_TABLET | Freq: Every day | ORAL | 1 refills | Status: DC

## 2017-03-05 NOTE — Progress Notes (Signed)
   Subjective:  Patient presents today for pain and tenderness to the bilateral ankles, left greater than right that has been ongoing for several years.  She reports associated swelling.  She also reports history of gout and is having an exacerbation of the right great toe.  Dr. Prudence Davidson gave her an injection at her last visit which caused her CBGs to increase.  Patient relates significant pain and tenderness when walking.  There are no alleviating factors noted.  Patient presents for further treatment and evaluation.   Past Medical History:  Diagnosis Date  . Anemia, unspecified   . Arthritis    "knees; right shoulder" (09/15/2013)  . Basal cell carcinoma    "right upper outer lip"  . DM neuropathy, type II diabetes mellitus ([place])   . Epilepsy ([place])   . GERD (gastroesophageal reflux disease)   . Gout   . High cholesterol   . Hypertension   . Hypothyroidism   . Meniere's disease   . Obesity (BMI 30-39.9)   . Other forms of epilepsy and recurrent seizures without mention of intractable epilepsy 11/04/2012   Non convulsive paroxysmal spells, responding to [facility], patient not driving.   . Pneumonia ~ 1943  . Skin cancer    "forehead; right hand"  . Type II or unspecified type diabetes mellitus without mention of complication, not stated as uncontrolled   . UTI (urinary tract infection)   . Vitamin D deficiency      Objective / Physical Exam:  General:  The patient is alert and oriented x3 in no acute distress. Dermatology:  Skin is warm, dry and supple bilateral lower extremities. Negative for open lesions or macerations. Vascular:  Palpable pedal pulses bilaterally. No edema or erythema noted. Capillary refill within normal limits. Neurological:  Epicritic and protective threshold grossly intact bilaterally.  Musculoskeletal Exam:  Pain on palpation to the anterior lateral medial aspects of the patient's left ankle. Mild edema noted. Erythema with edema localized to the first MPJ  of the right foot. Range of motion within normal limits to all pedal and ankle joints bilateral. Muscle strength 5/5 in all groups bilateral.   Radiographic Exam:  Normal osseous mineralization. Joint spaces preserved. No fracture/dislocation/boney destruction.    Assessment: #1 pain in left ankle #2 capsulitis of left ankle #3 First MPJ capsulitis/gout right  Plan of Care:  #1 Patient was evaluated.  X-rays reviewed. #2  Prescription for meloxicam 15 mg provided to patient. #3  Continue taking OTC extra strength Tylenol.   #4  recommended wearing her postop shoe on the right foot.   #5  patient is to return to clinic in 4 weeks   Edrick Kins, DPM [facility]  Dr. Edrick Kins, [address]                                        [place], [place] [address]                Office [phone]  Fax [phone]

## 2017-03-18 ENCOUNTER — Other Ambulatory Visit: Payer: Self-pay | Admitting: *Deleted

## 2017-03-18 MED ORDER — ATENOLOL 50 MG PO TABS: 50.0000 mg | ORAL_TABLET | Freq: Every day | ORAL | 1 refills | Status: DC

## 2017-03-31 ENCOUNTER — Encounter: Payer: Self-pay | Admitting: Podiatry

## 2017-03-31 ENCOUNTER — Ambulatory Visit: Payer: Medicare Other | Admitting: Podiatry

## 2017-03-31 DIAGNOSIS — M7752 Other enthesopathy of left foot: Secondary | ICD-10-CM

## 2017-03-31 DIAGNOSIS — M25572 Pain in left ankle and joints of left foot: Secondary | ICD-10-CM | POA: Diagnosis not present

## 2017-03-31 DIAGNOSIS — M779 Enthesopathy, unspecified: Principal | ICD-10-CM

## 2017-03-31 DIAGNOSIS — M778 Other enthesopathies, not elsewhere classified: Secondary | ICD-10-CM

## 2017-04-01 NOTE — Progress Notes (Signed)
   HPI: 84 year old female presenting today for follow up evaluation of left ankle pain and right great toe pain. She reports significant relief of both areas. She has been taking Meloxicam as directed. There are no modifying factors noted. Patient is here for further evaluation and treatment.   Past Medical History:  Diagnosis Date  . Anemia, unspecified   . Arthritis    "knees; right shoulder" (09/15/2013)  . Basal cell carcinoma    "right upper outer lip"  . DM neuropathy, type II diabetes mellitus ([place])   . Epilepsy ([place])   . GERD (gastroesophageal reflux disease)   . Gout   . High cholesterol   . Hypertension   . Hypothyroidism   . Meniere's disease   . Obesity (BMI 30-39.9)   . Other forms of epilepsy and recurrent seizures without mention of intractable epilepsy 11/04/2012   Non convulsive paroxysmal spells, responding to [facility], patient not driving.   . Pneumonia ~ 1943  . Skin cancer    "forehead; right hand"  . Type II or unspecified type diabetes mellitus without mention of complication, not stated as uncontrolled   . UTI (urinary tract infection)   . Vitamin D deficiency      Physical Exam: General: The patient is alert and oriented x3 in no acute distress.  Dermatology: Skin is warm, dry and supple bilateral lower extremities. Negative for open lesions or macerations.  Vascular: Palpable pedal pulses bilaterally. No edema or erythema noted. Capillary refill within normal limits.  Neurological: Epicritic and protective threshold grossly intact bilaterally.   Musculoskeletal Exam: Range of motion within normal limits to all pedal and ankle joints bilateral. Muscle strength 5/5 in all groups bilateral.   Assessment: - capsulitis left ankle - resolved - first MPJ capsulitis/gout right - resolved   Plan of Care:  - Patient evaluated. - Continue wearing DM shoes.  - Continue taking Meloxicam 15 mg as needed.  - Return to clinic in 3 months with Dr. Prudence Davidson.     Edrick Kins, DPM [facility]  Dr. Edrick Kins, DPM    [address], [place] [address]                Office [phone]  Fax [phone]

## 2017-04-21 ENCOUNTER — Other Ambulatory Visit: Payer: Self-pay | Admitting: *Deleted

## 2017-04-21 ENCOUNTER — Other Ambulatory Visit: Payer: Self-pay | Admitting: Internal Medicine

## 2017-04-21 MED ORDER — GLUCOSE BLOOD VI STRP: ORAL_STRIP | 1 refills | Status: DC

## 2017-04-28 ENCOUNTER — Other Ambulatory Visit: Payer: Self-pay | Admitting: Podiatry

## 2017-04-29 ENCOUNTER — Encounter: Payer: Self-pay | Admitting: Podiatry

## 2017-04-29 ENCOUNTER — Ambulatory Visit (INDEPENDENT_AMBULATORY_CARE_PROVIDER_SITE_OTHER): Payer: Medicare Other | Admitting: Podiatry

## 2017-04-29 DIAGNOSIS — M79674 Pain in right toe(s): Secondary | ICD-10-CM

## 2017-04-29 DIAGNOSIS — E1149 Type 2 diabetes mellitus with other diabetic neurological complication: Secondary | ICD-10-CM

## 2017-04-29 DIAGNOSIS — B351 Tinea unguium: Secondary | ICD-10-CM | POA: Diagnosis not present

## 2017-04-29 DIAGNOSIS — E1140 Type 2 diabetes mellitus with diabetic neuropathy, unspecified: Secondary | ICD-10-CM

## 2017-04-29 DIAGNOSIS — M2011 Hallux valgus (acquired), right foot: Secondary | ICD-10-CM

## 2017-04-29 NOTE — Progress Notes (Signed)
This patient returns to the office for preventative foot care services. She says she is not having any pain or discomfort. . She says her nails have grown thick and long.  Her nails are painful walking and wearing her shoes. She presents the office today for continued evaluation and treatment .  Her ulcer has resolved.   GENERAL APPEARANCE: Alert, conversant. Appropriately groomed. No acute distress.  VASCULAR: Pedal pulses are  palpable at  DP and PT bilateral.  Capillary refill time is immediate to all digits,  Normal temperature gradient.   NEUROLOGIC: sensation is normal to 5.07 monofilament at 5/5 sites bilateral.  Light touch is intact bilateral, Muscle strength normal.  MUSCULOSKELETAL: acceptable muscle strength, tone and stability bilateral.  Intrinsic muscluature intact bilateral.  Rectus appearance of foot and digits noted bilateral.   DERMATOLOGIC: skin color, texture, and turgor are within normal limits.  No preulcerative lesions or ulcers  are seen, no interdigital maceration noted.  No open lesions present.  ... No drainage noted or infection noted.  Nails  Thick disfigured discolored nail right hallux.  Absent hallux toenail left foot.     Onychomycosis  Right hallux  TX  Debridement of long thick nails.  RTC 3 months.    Cornesha Radziewicz DPM  

## 2017-05-14 ENCOUNTER — Ambulatory Visit: Payer: Self-pay | Admitting: Adult Health

## 2017-05-18 NOTE — Progress Notes (Signed)
FOLLOW UP  Assessment and Plan:   Hypertension Well controlled with current medications  Monitor blood pressure at home; patient to call if consistently greater than 130/80 Continue DASH diet.   Reminder to go to the ER if any CP, SOB, nausea, dizziness, severe HA, changes vision/speech, left arm numbness and tingling and jaw pain.  Cholesterol Currently above goal; diabetic; no longer treated secondary to age -  Continue low cholesterol diet and exercise.  Check lipid panel.   Diabetes with diabetic chronic kidney disease  Continue medication: humulin 70/30 takes 40 units once daily at 2 pm, unclear why this dosing schedule was initially initiated; discussed pending A1C results will switch to BID dosing starting 25 units AM and 20 units PM Continue diet and exercise.  Perform daily foot/skin check, notify office of any concerning changes.  Check A1C  Seizure disorder No recent seizures; continue keppra  Followed by Dr. Brett Fairy Check keppra levels  CKD stage 4 Medications have been adjusted, would not have dialysis, advised to avoid NSAIDS Increase fluids, avoid NSAIDS, monitor sugars, will monitor  Obesity with co morbidities Long discussion about weight loss, diet, and exercise Recommended diet heavy in fruits and veggies and low in animal meats, cheeses, and dairy products, appropriate calorie intake Discussed ideal weight for height  Will follow up in 3 months  Hypothyroidism continue medications the same pending lab results reminded to take on an empty stomach 30-93mins before food.  check TSH level  Vitamin D Def At goal at last visit; continue supplementation to maintain goal of 70-100 Defer Vit D level  Bilateral knee arthritis Ongoing bilateral knee pain; previously established with Dr. Durward Fortes and was recommended TKA for L but did not pursue at that time. Requests referral to alternate provider today.   Continue diet and meds as discussed. Further  disposition pending results of labs. Discussed med's effects and SE's.   Over 30 minutes of exam, counseling, chart review, and critical decision making was performed.   Future Appointments  Date Time Provider [place]  06/23/2017  9:30 AM Ward Givens, NP [facility] None  07/30/2017  3:15 PM Gardiner Barefoot, DPM [facility]  08/14/2017 10:00 AM Unk Pinto, MD [facility] None    ----------------------------------------------------------------------------------------------------------------------  HPI 84 y.o. female  presents for 3 month follow up on hypertension, cholesterol, diabetes with CKD 4, hypothyroid, morbid obesity and vitamin D deficiency. Patient also has hx/o Seizure Disorder manifest with fugue states /automatisms controlled on Keppra and followed by Dr Brett Fairy. In light of recent decline from CKD stage 3 to stage 4, her metformin was d/c'd along with other oral medications. She continues with [place]. She reports ongoing intermittent bilateral knee pain; she was seen by Dr. Durward Fortes and recommended TKA of the left, but declined this at that time. After discussion, she requests referral to new provider to discuss management options.   BMI is Body mass index is 36.99 kg/m., she has been working on diet - limited in exercise.  Wt Readings from Last 3 Encounters:  05/19/17 199 lb (90.3 kg)  02/03/17 199 lb 9.6 oz (90.5 kg)  12/10/16 198 lb 9.6 oz (90.1 kg)   Her blood pressure has been controlled at home, today their BP is BP: 126/70  She does not workout. She denies chest pain, shortness of breath, dizziness.   She is not on cholesterol medication and denies myalgias. Her cholesterol is not at goal. The cholesterol last visit was:   Lab Results  Component Value Date  CHOL 161 02/03/2017   HDL 25 (L) 02/03/2017   LDLCALC 108 (H) 02/03/2017   TRIG 164 (H) 02/03/2017   CHOLHDL 6.4 (H) 02/03/2017    She has been working on diet and exercise for T2  diabetes, and denies foot ulcerations, hypoglycemia , increased appetite, nausea, polydipsia, polyuria, visual disturbances, vomiting and weight loss. She does check fasting sugars, which have ranged 190-210. She is currently taking 40 units daily at 2 pm per previous instructions. Her diabetic medicationsLast A1C in the office was:  Lab Results  Component Value Date   HGBA1C 7.8 (H) 02/03/2017   She is on thyroid medication. Her medication was not changed last visit.   Lab Results  Component Value Date   TSH 1.04 02/03/2017   Patient is on Vitamin D supplement and was at goal at recent check:   Lab Results  Component Value Date   VD25OH 65 02/03/2017        Current Medications:  Current Outpatient Medications on File Prior to Visit  Medication Sig  . acetaminophen (TYLENOL) 500 MG tablet Take 1,000 mg by mouth every 6 (six) hours as needed for moderate pain.  Marland Kitchen aspirin 81 MG tablet Take 81 mg by mouth daily.  Marland Kitchen atenolol (TENORMIN) 50 MG tablet Take 1 tablet (50 mg total) by mouth daily.  . Biotin 5000 MCG TABS Take 5,000 Units by mouth 2 (two) times daily.   . ferrous sulfate dried (SLOW FE) 160 (50 FE) MG TBCR Take 160 mg by mouth 2 (two) times daily.   . fish oil-omega-3 fatty acids 1000 MG capsule Take 2 g by mouth at bedtime.   . Flaxseed, Linseed, (FLAX SEED OIL) 1000 MG CAPS Take 1 capsule by mouth 3 (three) times daily.  Marland Kitchen glucose blood (FREESTYLE INSULINX TEST) test strip USE 1 STRIP TO CHECK GLUCOSE ONCE DAILY IN THE MORNING. [id number]  . Lancets (FREESTYLE) lancets USE ONE  TO CHECK GLUCOSE ONCE DAILY IN THE MORNING DX-E11.9  . lansoprazole (PREVACID) 30 MG capsule TAKE 1 BY MOUTH DAILY  . levETIRAcetam (KEPPRA) 500 MG tablet TAKE 1 BY MOUTH 3 TIMES DAILY AFTER MEALS  . levothyroxine (SYNTHROID, LEVOTHROID) 100 MCG tablet Take 1 tablet (100 mcg total) daily by mouth.  . loratadine (CLARITIN) 10 MG tablet Take 10 mg by mouth daily.  . magnesium oxide (MAG-OX) 400 MG tablet  Take 400 mg by mouth 3 (three) times daily.   . meclizine (ANTIVERT) 25 MG tablet Take 1 tablet (25 mg total) by mouth daily.  Marland Kitchen OVER THE COUNTER MEDICATION Patient injects Humulin 70/30 30 units at 2 PM daily.  . potassium chloride SA (K-DUR,KLOR-CON) 20 MEQ tablet Take 1 tablet (20 mEq total) by mouth daily.  . vitamin B-12 (CYANOCOBALAMIN) 100 MCG tablet Take 50 mcg by mouth daily.  . vitamin C (ASCORBIC ACID) 500 MG tablet Take 500 mg by mouth daily.  Marland Kitchen glimepiride (AMARYL) 4 MG tablet take 1 tablet by mouth once daily WITH BREAKFAST (Patient not taking: Reported on 05/19/2017)  . meloxicam (MOBIC) 15 MG tablet TAKE 1 TABLET BY MOUTH EVERY DAY (Patient not taking: Reported on 05/19/2017)  . saxagliptin HCl (ONGLYZA) 5 MG TABS tablet Take 1 tablet daily as directed for Diabetes (Patient not taking: Reported on 05/19/2017)   No current facility-administered medications on file prior to visit.      Allergies:  Allergies  Allergen Reactions  . Ppd [Tuberculin Purified Protein Derivative] Other (See Comments)    indurated  Medical History:  Past Medical History:  Diagnosis Date  . Anemia, unspecified   . Arthritis    "knees; right shoulder" (09/15/2013)  . Basal cell carcinoma    "right upper outer lip"  . DM neuropathy, type II diabetes mellitus ([place])   . Epilepsy ([place])   . GERD (gastroesophageal reflux disease)   . Gout   . High cholesterol   . Hypertension   . Hypothyroidism   . Meniere's disease   . Obesity (BMI 30-39.9)   . Other forms of epilepsy and recurrent seizures without mention of intractable epilepsy 11/04/2012   Non convulsive paroxysmal spells, responding to [facility], patient not driving.   . Pneumonia ~ 1943  . Skin cancer    "forehead; right hand"  . Type II or unspecified type diabetes mellitus without mention of complication, not stated as uncontrolled   . UTI (urinary tract infection)   . Vitamin D deficiency    Family history- Reviewed and  unchanged Social history- Reviewed and unchanged   Review of Systems:  Review of Systems  Constitutional: Negative for malaise/fatigue and weight loss.  HENT: Negative for hearing loss and tinnitus.   Eyes: Negative for blurred vision and double vision.  Respiratory: Negative for cough, shortness of breath and wheezing.   Cardiovascular: Negative for chest pain, palpitations, orthopnea, claudication and leg swelling.  Gastrointestinal: Negative for abdominal pain, blood in stool, constipation, diarrhea, heartburn, melena, nausea and vomiting.  Genitourinary: Negative.   Musculoskeletal: Positive for joint pain. Negative for falls and myalgias.  Skin: Negative for rash.  Neurological: Negative for dizziness, tingling, sensory change, weakness and headaches.  Endo/Heme/Allergies: Negative for polydipsia.  Psychiatric/Behavioral: Negative.   All other systems reviewed and are negative.   Physical Exam: BP 126/70   Pulse 63   Temp (!) 97.3 F (36.3 C)   Ht 5' 1.5" (1.562 m)   Wt 199 lb (90.3 kg)   SpO2 99%   BMI 36.99 kg/m  Wt Readings from Last 3 Encounters:  05/19/17 199 lb (90.3 kg)  02/03/17 199 lb 9.6 oz (90.5 kg)  12/10/16 198 lb 9.6 oz (90.1 kg)   General Appearance: Well nourished, in no apparent distress. Eyes: PERRLA, EOMs, conjunctiva no swelling or erythema Sinuses: No Frontal/maxillary tenderness ENT/Mouth: Ext aud canals clear, TMs without erythema, bulging. No erythema, swelling, or exudate on post pharynx.  Tonsils not swollen or erythematous. Hearing normal.  Neck: Supple, thyroid normal.  Respiratory: Respiratory effort normal, BS equal bilaterally without rales, rhonchi, wheezing or stridor.  Cardio: RRR with no RGs, very mild 2/6 early systolic murmur at L 2nd ICD without radiation. Symmetrical peripheral pulses without notable edema (wearing compression hose).  Abdomen: Soft, + BS.  Non tender, no guarding, rebound, hernias, masses. Lymphatics: Non tender  without lymphadenopathy.  Musculoskeletal: Full ROM, Symmetrical strength, Slow gait Skin: Warm, dry without rashes, lesions, ecchymosis. Bilateral feet skin intact.  Neuro: Cranial nerves intact. No cerebellar symptoms.  Psych: Awake and oriented X 3, normal affect, Insight and Judgment appropriate.    Izora Ribas, NP 9:36 AM [facility]

## 2017-05-19 ENCOUNTER — Ambulatory Visit: Payer: Medicare Other | Admitting: Adult Health

## 2017-05-19 ENCOUNTER — Encounter: Payer: Self-pay | Admitting: Adult Health

## 2017-05-19 VITALS — BP 126/70 | HR 63 | Temp 97.3°F | Ht 61.5 in | Wt 199.0 lb

## 2017-05-19 DIAGNOSIS — E1122 Type 2 diabetes mellitus with diabetic chronic kidney disease: Secondary | ICD-10-CM

## 2017-05-19 DIAGNOSIS — Z79899 Other long term (current) drug therapy: Secondary | ICD-10-CM

## 2017-05-19 DIAGNOSIS — D509 Iron deficiency anemia, unspecified: Secondary | ICD-10-CM

## 2017-05-19 DIAGNOSIS — E1149 Type 2 diabetes mellitus with other diabetic neurological complication: Secondary | ICD-10-CM | POA: Diagnosis not present

## 2017-05-19 DIAGNOSIS — E039 Hypothyroidism, unspecified: Secondary | ICD-10-CM | POA: Diagnosis not present

## 2017-05-19 DIAGNOSIS — M179 Osteoarthritis of knee, unspecified: Secondary | ICD-10-CM

## 2017-05-19 DIAGNOSIS — M1710 Unilateral primary osteoarthritis, unspecified knee: Secondary | ICD-10-CM

## 2017-05-19 DIAGNOSIS — G40909 Epilepsy, unspecified, not intractable, without status epilepticus: Secondary | ICD-10-CM | POA: Diagnosis not present

## 2017-05-19 DIAGNOSIS — E11621 Type 2 diabetes mellitus with foot ulcer: Secondary | ICD-10-CM

## 2017-05-19 DIAGNOSIS — K219 Gastro-esophageal reflux disease without esophagitis: Secondary | ICD-10-CM | POA: Diagnosis not present

## 2017-05-19 DIAGNOSIS — L97511 Non-pressure chronic ulcer of other part of right foot limited to breakdown of skin: Secondary | ICD-10-CM

## 2017-05-19 DIAGNOSIS — L97519 Non-pressure chronic ulcer of other part of right foot with unspecified severity: Secondary | ICD-10-CM

## 2017-05-19 DIAGNOSIS — I10 Essential (primary) hypertension: Secondary | ICD-10-CM | POA: Diagnosis not present

## 2017-05-19 DIAGNOSIS — N184 Chronic kidney disease, stage 4 (severe): Secondary | ICD-10-CM

## 2017-05-19 DIAGNOSIS — E559 Vitamin D deficiency, unspecified: Secondary | ICD-10-CM

## 2017-05-19 DIAGNOSIS — E782 Mixed hyperlipidemia: Secondary | ICD-10-CM | POA: Diagnosis not present

## 2017-05-19 MED ORDER — FUROSEMIDE 40 MG PO TABS: 40.0000 mg | ORAL_TABLET | Freq: Every day | ORAL | 1 refills | Status: DC

## 2017-05-19 NOTE — Patient Instructions (Addendum)
Aim for 7+ servings of fruits and vegetables daily  80+ fluid ounces of water or unsweet tea for healthy kidneys  Avoid alcohol  Limit animal fats in diet for cholesterol and heart health - choose grass fed whenever available  Aim for low stress - take time to unwind and care for your mental health  Aim for 150 min of moderate intensity exercise weekly for heart health, and weights twice weekly for bone health  Aim for 7-9 hours of sleep daily     When it comes to diets, agreement about the perfect plan isn't easy to find, even among the experts. Experts at the [employer] developed an idea known as the Healthy Eating Plate. Just imagine a plate divided into logical, healthy portions.  The emphasis is on diet quality:  Load up on vegetables and fruits - one-half of your plate: Aim for color and variety, and remember that potatoes don't count.  Go for whole grains - one-quarter of your plate: Whole wheat, barley, wheat berries, quinoa, oats, brown rice, and foods made with them. If you want pasta, go with whole wheat pasta.  Protein power - one-quarter of your plate: Fish, chicken, beans, and nuts are all healthy, versatile protein sources. Limit red meat.  The diet, however, does go beyond the plate, offering a few other suggestions.  Use healthy plant oils, such as olive, canola, soy, corn, sunflower and peanut. Check the labels, and avoid partially hydrogenated oil, which have unhealthy trans fats.  If you're thirsty, drink water. Coffee and tea are good in moderation, but skip sugary drinks and limit milk and dairy products to one or two daily servings.  The type of carbohydrate in the diet is more important than the amount. Some sources of carbohydrates, such as vegetables, fruits, whole grains, and beans-are healthier than others.  Finally, stay active.   Chronic Kidney Disease, Adult Chronic kidney disease (CKD) occurs when the kidneys become  damaged slowly over a long period of time. The kidneys are a pair of organs that do many important jobs in the body, including:  Removing waste and extra fluid from the blood to make urine.  Making hormones that maintain the amount of fluid in tissues and blood vessels.  Maintaining the right amount of fluids and chemicals in the body.  A small amount of kidney damage may not cause problems, but a large amount of damage may make it hard or impossible for the kidneys to work the way they should. If steps are not taken to slow down kidney damage or to stop it from getting worse, the kidneys may stop working permanently (end-stage renal disease or ESRD). Most of the time, CKD does not go away, but it can often be controlled. People who have CKD are usually able to live normal lives. What are the causes? The most common causes of this condition are diabetes and high blood pressure (hypertension). Other causes include:  Heart and blood vessel (cardiovascular) disease.  Kidney diseases, such as: ? Glomerulonephritis. ? Interstitial nephritis. ? Polycystic kidney disease. ? Renal vascular disease.  Diseases that affect the immune system.  Genetic diseases.  Medicines that damage the kidneys, such as anti-inflammatory medicines.  Being around or being in contact with poisonous (toxic) substances.  A kidney or urinary infection that occurs again and again (recurs).  Vasculitis. This is swelling or inflammation of the blood vessels.  A problem with urine flow that may be caused by: ? Cancer. ?  Having kidney stones more than one time. ? An enlarged prostate, in males.  What increases the risk? You are more likely to develop this condition if you:  Are older than age 25.  Are female.  Are African-American, Hispanic, Asian, [place], or American [place].  Are a current or former smoker.  Are obese.  Have a family history of kidney disease or failure.  Often take  medicines that are damaging to the kidneys.  What are the signs or symptoms? Symptoms of this condition include:  Swelling (edema) of the face, legs, ankles, or feet.  Tiredness (lethargy) and having less energy.  Nausea or vomiting.  Confusion or trouble concentrating.  Problems with urination, such as: ? Painful or burning feeling during urination. ? Decreased urine production. ? Frequent urination, especially at night. ? Bloody urine.  Muscle twitches and cramps, especially in the legs.  Shortness of breath.  Weakness.  Loss of appetite.  Metallic taste in the mouth.  Trouble sleeping.  Dry, itchy skin.  A low blood count (anemia).  Pale lining of the eyelids and surface of the eye (conjunctiva).  Symptoms develop slowly and may not be obvious until the kidney damage becomes severe. It is possible to have kidney disease for years without having any symptoms. How is this diagnosed? This condition may be diagnosed based on:  Blood tests.  Urine tests.  Imaging tests, such as an ultrasound or CT scan.  A test in which a sample of tissue is removed from the kidneys to be examined under a microscope (kidney biopsy).  These test results will help your health care provider determine how serious the CKD is. How is this treated? There is no cure for most cases of this condition, but treatment usually relieves symptoms and prevents or slows the progression of the disease. Treatment may include:  Making diet changes, which may require you to avoid alcohol, salty foods (sodium), and foods that are high in potassium, calcium, and protein.  Medicines: ? To lower blood pressure. ? To control blood glucose. ? To relieve anemia. ? To relieve swelling. ? To protect your bones. ? To improve the balance of electrolytes in your blood.  Removing toxic waste from the body through types of dialysis, if the kidneys can no longer do their job (kidney failure).  Managing any  other conditions that are causing your CKD or making it worse.  Follow these instructions at home: Medicines  Take over-the-counter and prescription medicines only as told by your health care provider. The dose of some medicines that you take may need to be adjusted.  Do not take any new medicines unless approved by your health care provider. Many medicines can worsen your kidney damage.  Do not take any vitamin and mineral supplements unless approved by your health care provider. Many nutritional supplements can worsen your kidney damage. General instructions  Follow your prescribed diet as told by your health care provider.  Do not use any products that contain nicotine or tobacco, such as cigarettes and e-cigarettes. If you need help quitting, ask your health care provider.  Monitor and track your blood pressure at home. Report changes in your blood pressure as told by your health care provider.  If you are being treated for diabetes, monitor and track your blood sugar (blood glucose) levels as told by your health care provider.  Maintain a healthy weight. If you need help with this, ask your health care provider.  Start or continue an exercise plan.  Exercise at least 30 minutes a day, 5 days a week.  Keep your immunizations up to date as told by your health care provider.  Keep all follow-up visits as told by your health care provider. This is important. Where to find more information:  American Association of Kidney Patients: BombTimer.gl  National Kidney Foundation: www.kidney.[place]: https://mathis.com/  [facility]: www.lifeoptions.org and www.kidneyschool.org Contact a health care provider if:  Your symptoms get worse.  You develop new symptoms. Get help right away if:  You develop symptoms of ESRD, which include: ? Headaches. ? Numbness in the hands or feet. ? Easy bruising. ? Frequent hiccups. ? Chest pain. ? Shortness  of breath. ? Lack of menstruation, in women.  You have a fever.  You have decreased urine production.  You have pain or bleeding when you urinate. Summary  Chronic kidney disease (CKD) occurs when the kidneys become damaged slowly over a long period of time.  The most common causes of this condition are diabetes and high blood pressure (hypertension).  There is no cure for most cases of this condition, but treatment usually relieves symptoms and prevents or slows the progression of the disease. Treatment may include a combination of medicines and lifestyle changes. This information is not intended to replace advice given to you by your health care provider. Make sure you discuss any questions you have with your health care provider. Document Released: 11/14/2007 Document Revised: 03/14/2016 Document Reviewed: 03/14/2016 Elsevier Interactive Patient Education  [employer].

## 2017-05-20 ENCOUNTER — Other Ambulatory Visit: Payer: Self-pay | Admitting: Adult Health

## 2017-05-21 LAB — HEPATIC FUNCTION PANEL
AG Ratio: 1.7 (calc) (ref 1.0–2.5)
ALT: 12 U/L (ref 6–29)
AST: 11 U/L (ref 10–35)
Albumin: 4.3 g/dL (ref 3.6–5.1)
Alkaline phosphatase (APISO): 74 U/L (ref 33–130)
Bilirubin, Direct: 0.1 mg/dL (ref 0.0–0.2)
Globulin: 2.5 g/dL (calc) (ref 1.9–3.7)
Indirect Bilirubin: 0.3 mg/dL (calc) (ref 0.2–1.2)
Total Bilirubin: 0.4 mg/dL (ref 0.2–1.2)
Total Protein: 6.8 g/dL (ref 6.1–8.1)

## 2017-05-21 LAB — CBC WITH DIFFERENTIAL/PLATELET
Basophils Absolute: 41 cells/uL (ref 0–200)
Basophils Relative: 0.6 %
Eosinophils Absolute: 626 cells/uL — ABNORMAL HIGH (ref 15–500)
Eosinophils Relative: 9.2 %
HCT: 37.7 % (ref 35.0–45.0)
Hemoglobin: 12.2 g/dL (ref 11.7–15.5)
Lymphs Abs: 1469 cells/uL (ref 850–3900)
MCH: 27.7 pg (ref 27.0–33.0)
MCHC: 32.4 g/dL (ref 32.0–36.0)
MCV: 85.5 fL (ref 80.0–100.0)
MPV: 10.2 fL (ref 7.5–12.5)
Monocytes Relative: 7.3 %
Neutro Abs: 4168 cells/uL (ref 1500–7800)
Neutrophils Relative %: 61.3 %
Platelets: 338 10*3/uL (ref 140–400)
RBC: 4.41 10*6/uL (ref 3.80–5.10)
RDW: 17.1 % — ABNORMAL HIGH (ref 11.0–15.0)
Total Lymphocyte: 21.6 %
WBC mixed population: 496 cells/uL (ref 200–950)
WBC: 6.8 10*3/uL (ref 3.8–10.8)

## 2017-05-21 LAB — BASIC METABOLIC PANEL WITH GFR
BUN/Creatinine Ratio: 18 (calc) (ref 6–22)
BUN: 36 mg/dL — ABNORMAL HIGH (ref 7–25)
CO2: 28 mmol/L (ref 20–32)
Calcium: 11 mg/dL — ABNORMAL HIGH (ref 8.6–10.4)
Chloride: 106 mmol/L (ref 98–110)
Creat: 1.98 mg/dL — ABNORMAL HIGH (ref 0.60–0.88)
GFR, Est African American: 27 mL/min/{1.73_m2} — ABNORMAL LOW (ref >=60)
GFR, Est Non African American: 23 mL/min/{1.73_m2} — ABNORMAL LOW (ref >=60)
Glucose, Bld: 241 mg/dL — ABNORMAL HIGH (ref 65–99)
Potassium: 5.1 mmol/L (ref 3.5–5.3)
Sodium: 141 mmol/L (ref 135–146)

## 2017-05-21 LAB — HEMOGLOBIN A1C
Hgb A1c MFr Bld: 9 % of total Hgb — ABNORMAL HIGH (ref <5.7)
Mean Plasma Glucose: 212 (calc)
eAG (mmol/L): 11.7 (calc)

## 2017-05-21 LAB — LIPID PANEL
Cholesterol: 177 mg/dL (ref <200)
HDL: 30 mg/dL — ABNORMAL LOW (ref >50)
LDL Cholesterol (Calc): 115 mg/dL (calc) — ABNORMAL HIGH
Non-HDL Cholesterol (Calc): 147 mg/dL (calc) — ABNORMAL HIGH (ref <130)
Total CHOL/HDL Ratio: 5.9 (calc) — ABNORMAL HIGH (ref <5.0)
Triglycerides: 206 mg/dL — ABNORMAL HIGH (ref <150)

## 2017-05-21 LAB — TSH: TSH: 1.31 mIU/L (ref 0.40–4.50)

## 2017-05-21 LAB — MAGNESIUM: Magnesium: 2.3 mg/dL (ref 1.5–2.5)

## 2017-05-21 LAB — LEVETIRACETAM LEVEL: Keppra (Levetiracetam): 37.9 ug/mL

## 2017-05-25 ENCOUNTER — Other Ambulatory Visit: Payer: Self-pay | Admitting: Internal Medicine

## 2017-06-23 ENCOUNTER — Other Ambulatory Visit: Payer: Self-pay | Admitting: Internal Medicine

## 2017-06-23 ENCOUNTER — Ambulatory Visit: Payer: Medicare Other | Admitting: Adult Health

## 2017-06-26 ENCOUNTER — Other Ambulatory Visit: Payer: Self-pay | Admitting: Internal Medicine

## 2017-07-17 ENCOUNTER — Other Ambulatory Visit: Payer: Self-pay | Admitting: Internal Medicine

## 2017-07-30 ENCOUNTER — Ambulatory Visit (INDEPENDENT_AMBULATORY_CARE_PROVIDER_SITE_OTHER): Payer: Medicare Other | Admitting: Podiatry

## 2017-07-30 ENCOUNTER — Encounter: Payer: Self-pay | Admitting: Podiatry

## 2017-07-30 DIAGNOSIS — B351 Tinea unguium: Secondary | ICD-10-CM

## 2017-07-30 DIAGNOSIS — M2011 Hallux valgus (acquired), right foot: Secondary | ICD-10-CM

## 2017-07-30 DIAGNOSIS — E1140 Type 2 diabetes mellitus with diabetic neuropathy, unspecified: Secondary | ICD-10-CM | POA: Diagnosis not present

## 2017-07-30 DIAGNOSIS — M79674 Pain in right toe(s): Secondary | ICD-10-CM | POA: Diagnosis not present

## 2017-07-30 DIAGNOSIS — E1149 Type 2 diabetes mellitus with other diabetic neurological complication: Secondary | ICD-10-CM

## 2017-07-30 NOTE — Progress Notes (Signed)
This patient returns to the office for preventative foot care services. She says she is not having any pain or discomfort. . She says her nails have grown thick and long.  Her nails are painful walking and wearing her shoes. She presents the office today for continued evaluation and treatment .  Her ulcer has resolved.   GENERAL APPEARANCE: Alert, conversant. Appropriately groomed. No acute distress.  VASCULAR: Pedal pulses are  palpable at  DP and PT bilateral.  Capillary refill time is immediate to all digits,  Normal temperature gradient.   NEUROLOGIC: sensation is normal to 5.07 monofilament at 5/5 sites bilateral.  Light touch is intact bilateral, Muscle strength normal.  MUSCULOSKELETAL: acceptable muscle strength, tone and stability bilateral.  Intrinsic muscluature intact bilateral.  Rectus appearance of foot and digits noted bilateral.   DERMATOLOGIC: skin color, texture, and turgor are within normal limits.  No preulcerative lesions or ulcers  are seen, no interdigital maceration noted.  No open lesions present.  ... No drainage noted or infection noted.  Nails  Thick disfigured discolored nail right hallux.  Absent hallux toenail left foot.     Onychomycosis  Right hallux  TX  Debridement of long thick nails.  RTC 3 months.    Kawon Willcutt DPM  

## 2017-08-14 ENCOUNTER — Encounter: Payer: Self-pay | Admitting: Internal Medicine

## 2017-08-24 ENCOUNTER — Encounter: Payer: Self-pay | Admitting: Internal Medicine

## 2017-08-24 NOTE — Patient Instructions (Signed)

## 2017-08-24 NOTE — Progress Notes (Addendum)
[facility] Unk Pinto, M.D.     Uvaldo Bristle. Silverio Lay, P.A.-C [name], [place] [address] [place] [address] Telephone [phone] Telefax [phone] Annual Screening/Preventative Visit & Comprehensive Evaluation &  Examination     This very nice 84 y.o. WWF presents for a Screening /Preventative Visit & comprehensive evaluation and management of multiple medical co-morbidities.  Patient has been followed for HTN, HLD, T2_NIDDM/CKD4, Seizure Disorder  and Vitamin D Deficiency. Patient is followed by Dr Brett Fairy for Seizure Disorder.       HTN predates circa 1999. Patient's BP has been controlled at home and patient denies any cardiac symptoms as chest pain, palpitations, shortness of breath, dizziness or ankle swelling. Today's BP is at goal - 134/72.      Patient's hyperlipidemia is controlled with diet and medications. Patient denies myalgias or other medication SE's. Last lipids were not at goal: Lab Results  Component Value Date   CHOL 177 05/19/2017   HDL 30 (L) 05/19/2017   LDLCALC 115 (H) 05/19/2017   TRIG 206 (H) 05/19/2017   CHOLHDL 5.9 (H) 05/19/2017      Patient has  Morbid Obesity (BMI 37+) and T2_NIDDM (2001) w/CKD4 (GFR 25) and patient denies reactive hypoglycemic symptoms, visual blurring, diabetic polys, or paresthesias. Patient historically has been poorly diet compliant and last A1c was not at goal: Lab Results  Component Value Date   HGBA1C 9.0 (H) 05/19/2017      Patient has been on Thyroid Replacement since the 1990's. Finally, patient has history of Vitamin D Deficiency("37" in 2008) and last Vitamin D was at goal: Lab Results  Component Value Date   VD25OH 30 02/03/2017   Current Outpatient Medications on File Prior to Visit  Medication Sig  . acetaminophen (TYLENOL) 500 MG tablet Take 1,000 mg by mouth every 6 (six) hours as needed for moderate pain.  Marland Kitchen  aspirin 81 MG tablet Take 81 mg by mouth daily.  Marland Kitchen atenolol (TENORMIN) 50 MG tablet Take 1 tablet (50 mg total) by mouth daily.  . Biotin 5000 MCG TABS Take 5,000 Units by mouth 2 (two) times daily.   . ferrous sulfate dried (SLOW FE) 160 (50 FE) MG TBCR Take 160 mg by mouth 2 (two) times daily.   . fish oil-omega-3 fatty acids 1000 MG capsule Take 2 g by mouth at bedtime.   . Flaxseed, Linseed, (FLAX SEED OIL) 1000 MG CAPS Take 1 capsule by mouth 3 (three) times daily.  . furosemide (LASIX) 40 MG tablet Take 1 tablet (40 mg total) by mouth daily.  Marland Kitchen glucose blood (FREESTYLE INSULINX TEST) test strip USE 1 STRIP TO CHECK GLUCOSE ONCE DAILY IN THE MORNING. [id number]  . Lancets (FREESTYLE) lancets USE 1  TO CHECK GLUCOSE ONCE DAILY IN THE MORNING  . lansoprazole (PREVACID) 30 MG capsule TAKE 1 CAPSULE BY MOUTH EVERY DAY  . levETIRAcetam (KEPPRA) 500 MG tablet TAKE 1 TABLET BY MOUTH THREE TIMES DAILY AFTER MEALS. GENERIC EQUIVALENT FOR KEPPRA  . loratadine (CLARITIN) 10 MG tablet Take 10 mg by mouth daily.  . magnesium oxide (MAG-OX) 400 MG tablet Take 400 mg by mouth 3 (three) times daily.   . meclizine (ANTIVERT) 25 MG tablet TAKE 1 TABLET BY MOUTH DAILY  . OVER THE COUNTER MEDICATION Patient injects Humulin 70/30 25 units in AM, 20 units PM daily.   . potassium chloride SA (K-DUR,KLOR-CON) 20 MEQ tablet Take 1 tablet (20 mEq total) by mouth  daily.  . vitamin B-12 (CYANOCOBALAMIN) 100 MCG tablet Take 50 mcg by mouth daily.  . vitamin C (ASCORBIC ACID) 500 MG tablet Take 500 mg by mouth daily.   No current facility-administered medications on file prior to visit.    Allergies  Allergen Reactions  . Ppd [Tuberculin Purified Protein Derivative] Other (See Comments)    indurated   Past Medical History:  Diagnosis Date  . Anemia, unspecified   . Arthritis    "knees; right shoulder" (09/15/2013)  . Basal cell carcinoma    "right upper outer lip"  . DM neuropathy, type II diabetes mellitus  ([place])   . Epilepsy ([place])   . GERD (gastroesophageal reflux disease)   . Gout   . High cholesterol   . Hypertension   . Hypothyroidism   . Meniere's disease   . Obesity (BMI 30-39.9)   . Other forms of epilepsy and recurrent seizures without mention of intractable epilepsy 11/04/2012   Non convulsive paroxysmal spells, responding to [facility], patient not driving.   . Pneumonia ~ 1943  . Skin cancer    "forehead; right hand"  . Type II or unspecified type diabetes mellitus without mention of complication, not stated as uncontrolled   . UTI (urinary tract infection)   . Vitamin D deficiency    Health Maintenance  Topic Date Due  . OPHTHALMOLOGY EXAM  01/02/2017  . TETANUS/TDAP  03/17/2017  . URINE MICROALBUMIN  07/22/2017  . INFLUENZA VACCINE  09/18/2017  . HEMOGLOBIN A1C  11/18/2017  . FOOT EXAM  08/25/2018  . DEXA SCAN  Completed  . PNA vac Low Risk Adult  Completed   Immunization History  Administered Date(s) Administered  . DTaP 03/18/2007  . Influenza, High Dose Seasonal PF 10/18/2013, 12/12/2014, 10/24/2015, 10/28/2016  . Influenza-Unspecified 11/18/2012  . Pneumococcal Conjugate-13 12/12/2014  . Pneumococcal Polysaccharide-23 09/16/2013  . Zoster 02/28/2005   Last Colon - 08/01/2008 - Dr Henrene Pastor - recc 5 yr f/u   Last MGM - 07/29/2016 - due   Past Surgical History:  Procedure Laterality Date  . CERVICAL POLYPECTOMY    . HAMMER TOE SURGERY Left 1980's  . MOHS SURGERY  20087   "right upper outer lip"  . TONSILLECTOMY  ~ 1947  . WRIST SURGERY Left ~ 1950   "ran arm thru window"   Family History  Problem Relation Age of Onset  . Heart disease Mother   . Stroke Mother   . Heart disease Father   . Ovarian cancer Sister   . Hypertension Son   . Hypertension Son   . Diabetes Son   . Alcohol abuse Son    Social History   Tobacco Use  . Smoking status: Former Smoker    Packs/day: 1.00    Years: 30.00    Pack years: 30.00    Types: Cigarettes    Last attempt  to quit: 06/29/1983    Years since quitting: 34.1  . Smokeless tobacco: Never Used  Substance Use Topics  . Alcohol use: Yes    Alcohol/week: 0.0 oz    Comment: 09/15/2013 "glass of wine maybe once/year"  . Drug use: No    ROS Constitutional: Denies fever, chills, weight loss/gain, headaches, insomnia,  night sweats, and change in appetite. Does c/o fatigue. Eyes: Denies redness, blurred vision, diplopia, discharge, itchy, watery eyes.  ENT: Denies discharge, congestion, post nasal drip, epistaxis, sore throat, earache, hearing loss, dental pain, Tinnitus, Vertigo, Sinus pain, snoring.  Cardio: Denies chest pain, palpitations, irregular heartbeat, syncope, dyspnea,  diaphoresis, orthopnea, PND, claudication, edema Respiratory: denies cough, dyspnea, DOE, pleurisy, hoarseness, laryngitis, wheezing.  Gastrointestinal: Denies dysphagia, heartburn, reflux, water brash, pain, cramps, nausea, vomiting, bloating, diarrhea, constipation, hematemesis, melena, hematochezia, jaundice, hemorrhoids Genitourinary: Denies dysuria, frequency, urgency, nocturia, hesitancy, discharge, hematuria, flank pain Breast: Breast lumps, nipple discharge, bleeding.  Musculoskeletal: Denies arthralgia, myalgia, stiffness, Jt. Swelling, pain, limp, and strain/sprain. Denies falls. Skin: Denies puritis, rash, hives, warts, acne, eczema, changing in skin lesion Neuro: No weakness, tremor, incoordination, spasms, paresthesia, pain Psychiatric: Denies confusion, memory loss, sensory loss. Denies Depression. Endocrine: Denies change in weight, skin, hair change, nocturia, and paresthesia, diabetic polys, visual blurring, hyper / hypo glycemic episodes.  Heme/Lymph: No excessive bleeding, bruising, enlarged lymph nodes.  Physical Exam  BP 134/72   Pulse 64   Temp 97.6 F (36.4 C)   Resp 16   Ht 5\' 2"  (1.575 m)   Wt 199 lb 6.4 oz (90.4 kg)   BMI 36.47 kg/m   General Appearance: Over nourished, well groomed and in no  apparent distress.  Eyes: PERRLA, EOMs, conjunctiva no swelling or erythema, normal fundi and vessels. Sinuses: No frontal/maxillary tenderness ENT/Mouth: EACs patent / TMs  nl. Nares clear without erythema, swelling, mucoid exudates. Oral hygiene is good. No erythema, swelling, or exudate. Tongue normal, non-obstructing. Tonsils not swollen or erythematous. Hearing normal.  Neck: Supple, thyroid not palpable. Transmitted systolic m to the neck and no nodes or JVD. Respiratory: Respiratory effort normal.  BS equal and clear bilateral without rales, rhonci, wheezing or stridor. Cardio: Heart sounds are soft with regular rate and rhythm and Gr2-3/4 blowing Ao sclerosis sys m radiating widely & to the neck.  Peripheral pulses are normal and equal bilaterally without edema. No aortic or femoral bruits. Chest: symmetric with normal excursions and percussion. Breasts: Symmetric, without lumps, nipple discharge, retractions, or fibrocystic changes.  Abdomen: Flat, soft with bowel sounds active. Nontender, no guarding, rebound, hernias, masses, or organomegaly.  Lymphatics: Non tender without lymphadenopathy.  Musculoskeletal: Full ROM all peripheral extremities, joint stability, 5/5 strength, and normal gait. Skin: Warm and dry without rashes, lesions, cyanosis, clubbing or  ecchymosis.  Neuro: Cranial nerves intact, reflexes equal bilaterally. Normal muscle tone, no cerebellar symptoms. Sensation intact to touch, but decreased to vibratory and Monofilament in a stocking distribution from mid shins distally to the toes bilaterally. Pysch: Alert and oriented X 3, normal affect, Insight and Judgment appropriate.   Assessment and Plan  1. Annual Preventative Screening Examination  2. Essential hypertension  - EKG 12-Lead - Urinalysis, Routine w reflex microscopic - Microalbumin / creatinine urine ratio - CBC with Differential/Platelet - COMPLETE METABOLIC PANEL WITH GFR - Magnesium - TSH  3.  Hyperlipidemia, mixed  - EKG 12-Lead - Lipid panel - TSH  4. Type 2 diabetes mellitus with stage 4 chronic kidney disease, without long-term current use of insulin (HCC)  - EKG 12-Lead - Urinalysis, Routine w reflex microscopic - Microalbumin / creatinine urine ratio - Hemoglobin A1c - Insulin, random  5. Vitamin D deficiency  - VITAMIN D 25 Hydroxyl  6. Type II diabetes mellitus with neurological manifestations (HCC)  - EKG 12-Lead - HM DIABETES FOOT EXAM - LOW EXTREMITY NEUR EXAM DOCUM  7. Seizure disorder (HCC)  - Levetiracetam level  8. Hypothyroidism  - TSH  9. Screening for ischemic heart disease  - EKG 12-Lead  10. Former smoker  - EKG 12-Lead  11. FHx: heart disease  - EKG 12-Lead  12. Medication management  - Urinalysis, Routine  w reflex microscopic - Microalbumin / creatinine urine ratio - COMPLETE METABOLIC PANEL WITH GFR - Magnesium - Lipid panel - TSH - Hemoglobin A1c - VITAMIN D 25 Hydroxyl - Insulin, random - Levetiracetam level  13. Encounter for colorectal cancer screening  - POC Hemoccult Bld/Stl         Patient was counseled in prudent diet to achieve/maintain BMI less than 25 for weight control, BP monitoring, regular exercise and medications. Discussed med's effects and SE's. Screening labs and tests as requested with regular follow-up as recommended. Over 40 minutes of exam, counseling, chart review and high complex critical decision making was performed.

## 2017-08-25 ENCOUNTER — Other Ambulatory Visit: Payer: Self-pay | Admitting: *Deleted

## 2017-08-25 ENCOUNTER — Ambulatory Visit: Payer: Medicare Other | Admitting: Internal Medicine

## 2017-08-25 VITALS — BP 134/72 | HR 64 | Temp 97.6°F | Resp 16 | Ht 62.0 in | Wt 199.4 lb

## 2017-08-25 DIAGNOSIS — E1149 Type 2 diabetes mellitus with other diabetic neurological complication: Secondary | ICD-10-CM

## 2017-08-25 DIAGNOSIS — Z8249 Family history of ischemic heart disease and other diseases of the circulatory system: Secondary | ICD-10-CM

## 2017-08-25 DIAGNOSIS — Z136 Encounter for screening for cardiovascular disorders: Secondary | ICD-10-CM

## 2017-08-25 DIAGNOSIS — I10 Essential (primary) hypertension: Secondary | ICD-10-CM

## 2017-08-25 DIAGNOSIS — E1122 Type 2 diabetes mellitus with diabetic chronic kidney disease: Secondary | ICD-10-CM

## 2017-08-25 DIAGNOSIS — R8281 Pyuria: Secondary | ICD-10-CM

## 2017-08-25 DIAGNOSIS — Z1212 Encounter for screening for malignant neoplasm of rectum: Secondary | ICD-10-CM

## 2017-08-25 DIAGNOSIS — N184 Chronic kidney disease, stage 4 (severe): Secondary | ICD-10-CM

## 2017-08-25 DIAGNOSIS — Z1211 Encounter for screening for malignant neoplasm of colon: Secondary | ICD-10-CM

## 2017-08-25 DIAGNOSIS — Z87891 Personal history of nicotine dependence: Secondary | ICD-10-CM

## 2017-08-25 DIAGNOSIS — Z0001 Encounter for general adult medical examination with abnormal findings: Secondary | ICD-10-CM

## 2017-08-25 DIAGNOSIS — Z79899 Other long term (current) drug therapy: Secondary | ICD-10-CM

## 2017-08-25 DIAGNOSIS — G40909 Epilepsy, unspecified, not intractable, without status epilepticus: Secondary | ICD-10-CM

## 2017-08-25 DIAGNOSIS — Z0000 Encounter for general adult medical examination without abnormal findings: Secondary | ICD-10-CM

## 2017-08-25 DIAGNOSIS — E559 Vitamin D deficiency, unspecified: Secondary | ICD-10-CM

## 2017-08-25 DIAGNOSIS — E782 Mixed hyperlipidemia: Secondary | ICD-10-CM

## 2017-08-25 DIAGNOSIS — E039 Hypothyroidism, unspecified: Secondary | ICD-10-CM

## 2017-08-25 MED ORDER — LEVOTHYROXINE SODIUM 100 MCG PO TABS: 100.0000 ug | ORAL_TABLET | Freq: Every day | ORAL | 3 refills | Status: DC

## 2017-08-26 ENCOUNTER — Other Ambulatory Visit: Payer: Self-pay | Admitting: Internal Medicine

## 2017-08-26 DIAGNOSIS — R8281 Pyuria: Secondary | ICD-10-CM

## 2017-08-27 ENCOUNTER — Other Ambulatory Visit: Payer: Self-pay | Admitting: Internal Medicine

## 2017-08-27 DIAGNOSIS — R8281 Pyuria: Secondary | ICD-10-CM

## 2017-08-27 LAB — URINALYSIS, ROUTINE W REFLEX MICROSCOPIC
Bacteria, UA: NONE SEEN /HPF
Bilirubin Urine: NEGATIVE
Glucose, UA: NEGATIVE
Hgb urine dipstick: NEGATIVE
Hyaline Cast: NONE SEEN /LPF
Ketones, ur: NEGATIVE
Nitrite: NEGATIVE
Protein, ur: NEGATIVE
Specific Gravity, Urine: 1.012 (ref 1.001–1.03)
Squamous Epithelial / LPF: NONE SEEN /HPF (ref <=5)
WBC, UA: >=60 /HPF — AB (ref 0–5)
pH: <=5 (ref 5.0–8.0)

## 2017-08-27 LAB — LIPID PANEL
Cholesterol: 156 mg/dL (ref <200)
HDL: 29 mg/dL — ABNORMAL LOW (ref >50)
LDL Cholesterol (Calc): 100 mg/dL (calc) — ABNORMAL HIGH
Non-HDL Cholesterol (Calc): 127 mg/dL (calc) (ref <130)
Total CHOL/HDL Ratio: 5.4 (calc) — ABNORMAL HIGH (ref <5.0)
Triglycerides: 177 mg/dL — ABNORMAL HIGH (ref <150)

## 2017-08-27 LAB — COMPLETE METABOLIC PANEL WITH GFR
AG Ratio: 1.8 (calc) (ref 1.0–2.5)
ALT: 14 U/L (ref 6–29)
AST: 13 U/L (ref 10–35)
Albumin: 4.6 g/dL (ref 3.6–5.1)
Alkaline phosphatase (APISO): 70 U/L (ref 33–130)
BUN/Creatinine Ratio: 19 (calc) (ref 6–22)
BUN: 35 mg/dL — ABNORMAL HIGH (ref 7–25)
CO2: 26 mmol/L (ref 20–32)
Calcium: 10.7 mg/dL — ABNORMAL HIGH (ref 8.6–10.4)
Chloride: 107 mmol/L (ref 98–110)
Creat: 1.89 mg/dL — ABNORMAL HIGH (ref 0.60–0.88)
GFR, Est African American: 28 mL/min/{1.73_m2} — ABNORMAL LOW (ref >=60)
GFR, Est Non African American: 24 mL/min/{1.73_m2} — ABNORMAL LOW (ref >=60)
Globulin: 2.6 g/dL (calc) (ref 1.9–3.7)
Glucose, Bld: 113 mg/dL — ABNORMAL HIGH (ref 65–99)
Potassium: 4.4 mmol/L (ref 3.5–5.3)
Sodium: 144 mmol/L (ref 135–146)
Total Bilirubin: 0.5 mg/dL (ref 0.2–1.2)
Total Protein: 7.2 g/dL (ref 6.1–8.1)

## 2017-08-27 LAB — CBC WITH DIFFERENTIAL/PLATELET
Basophils Absolute: 58 cells/uL (ref 0–200)
Basophils Relative: 0.6 %
Eosinophils Absolute: 614 cells/uL — ABNORMAL HIGH (ref 15–500)
Eosinophils Relative: 6.4 %
HCT: 36 % (ref 35.0–45.0)
Hemoglobin: 11.9 g/dL (ref 11.7–15.5)
Lymphs Abs: 1622 cells/uL (ref 850–3900)
MCH: 28.4 pg (ref 27.0–33.0)
MCHC: 33.1 g/dL (ref 32.0–36.0)
MCV: 85.9 fL (ref 80.0–100.0)
MPV: 10.7 fL (ref 7.5–12.5)
Monocytes Relative: 6.9 %
Neutro Abs: 6643 cells/uL (ref 1500–7800)
Neutrophils Relative %: 69.2 %
Platelets: 350 10*3/uL (ref 140–400)
RBC: 4.19 10*6/uL (ref 3.80–5.10)
RDW: 17 % — ABNORMAL HIGH (ref 11.0–15.0)
Total Lymphocyte: 16.9 %
WBC mixed population: 662 cells/uL (ref 200–950)
WBC: 9.6 10*3/uL (ref 3.8–10.8)

## 2017-08-27 LAB — MICROALBUMIN / CREATININE URINE RATIO
Creatinine, Urine: 20 mg/dL (ref 20–275)
Microalb Creat Ratio: 80 mcg/mg creat — ABNORMAL HIGH (ref <30)
Microalb, Ur: 1.6 mg/dL

## 2017-08-27 LAB — MAGNESIUM: Magnesium: 2.2 mg/dL (ref 1.5–2.5)

## 2017-08-27 LAB — TSH: TSH: 1.21 mIU/L (ref 0.40–4.50)

## 2017-08-27 LAB — HEMOGLOBIN A1C
Hgb A1c MFr Bld: 8.2 % of total Hgb — ABNORMAL HIGH (ref <5.7)
Mean Plasma Glucose: 189 (calc)
eAG (mmol/L): 10.4 (calc)

## 2017-08-27 LAB — VITAMIN D 25 HYDROXY (VIT D DEFICIENCY, FRACTURES): Vit D, 25-Hydroxy: 108 ng/mL — ABNORMAL HIGH (ref 30–100)

## 2017-08-27 LAB — LEVETIRACETAM LEVEL: Keppra (Levetiracetam): 44 ug/mL (ref 12.0–46.0)

## 2017-08-27 LAB — INSULIN, RANDOM: Insulin: 54.2 u[IU]/mL — ABNORMAL HIGH (ref 2.0–19.6)

## 2017-08-27 NOTE — Addendum Note (Signed)
Addended by: Eulis Canner on: 08/27/2017 08:50 AM   Modules accepted: Orders

## 2017-08-29 ENCOUNTER — Other Ambulatory Visit: Payer: Self-pay | Admitting: Internal Medicine

## 2017-08-29 DIAGNOSIS — N3000 Acute cystitis without hematuria: Secondary | ICD-10-CM

## 2017-08-29 LAB — URINE CULTURE
MICRO NUMBER:: 90818316
SPECIMEN QUALITY:: ADEQUATE

## 2017-08-29 MED ORDER — CIPROFLOXACIN HCL 250 MG PO TABS: ORAL_TABLET | ORAL | 0 refills | Status: DC

## 2017-09-01 ENCOUNTER — Other Ambulatory Visit: Payer: Self-pay | Admitting: *Deleted

## 2017-09-01 ENCOUNTER — Telehealth: Payer: Self-pay | Admitting: Internal Medicine

## 2017-09-01 MED ORDER — VITAMIN D3 250 MCG (10000 UT) PO CAPS: 10000.0000 [IU] | ORAL_CAPSULE | Freq: Every day | ORAL | Status: DC

## 2017-09-01 NOTE — Telephone Encounter (Signed)
Patient states she takes Vit D 10,000 units daily.

## 2017-09-18 ENCOUNTER — Encounter: Payer: Self-pay | Admitting: *Deleted

## 2017-09-22 ENCOUNTER — Other Ambulatory Visit: Payer: Self-pay | Admitting: *Deleted

## 2017-09-22 MED ORDER — MECLIZINE HCL 25 MG PO TABS: 25.0000 mg | ORAL_TABLET | Freq: Every day | ORAL | 0 refills | Status: DC

## 2017-09-22 MED ORDER — LEVOTHYROXINE SODIUM 100 MCG PO TABS
100.0000 ug | ORAL_TABLET | Freq: Every day | ORAL | 3 refills | Status: DC
Start: 2017-09-22 — End: 2018-08-18

## 2017-09-26 ENCOUNTER — Other Ambulatory Visit: Payer: Self-pay

## 2017-09-26 DIAGNOSIS — Z1211 Encounter for screening for malignant neoplasm of colon: Secondary | ICD-10-CM

## 2017-09-26 DIAGNOSIS — Z1212 Encounter for screening for malignant neoplasm of rectum: Principal | ICD-10-CM

## 2017-09-26 LAB — POC HEMOCCULT BLD/STL (HOME/3-CARD/SCREEN)
Card #2 Fecal Occult Blod, POC: NEGATIVE
Card #3 Fecal Occult Blood, POC: NEGATIVE
Fecal Occult Blood, POC: NEGATIVE

## 2017-09-29 ENCOUNTER — Ambulatory Visit (INDEPENDENT_AMBULATORY_CARE_PROVIDER_SITE_OTHER): Payer: Medicare Other

## 2017-09-29 DIAGNOSIS — N3000 Acute cystitis without hematuria: Secondary | ICD-10-CM

## 2017-09-30 LAB — URINALYSIS, ROUTINE W REFLEX MICROSCOPIC
Bacteria, UA: NONE SEEN /HPF
Bilirubin Urine: NEGATIVE
Glucose, UA: NEGATIVE
Hgb urine dipstick: NEGATIVE
Hyaline Cast: NONE SEEN /LPF
Ketones, ur: NEGATIVE
Nitrite: NEGATIVE
Protein, ur: NEGATIVE
RBC / HPF: NONE SEEN /HPF (ref 0–2)
Specific Gravity, Urine: 1.017 (ref 1.001–1.03)
Squamous Epithelial / LPF: NONE SEEN /HPF (ref <=5)
pH: 5.5 (ref 5.0–8.0)

## 2017-09-30 LAB — URINE CULTURE
MICRO NUMBER:: 90953975
Result:: NO GROWTH
SPECIMEN QUALITY:: ADEQUATE

## 2017-10-03 ENCOUNTER — Other Ambulatory Visit: Payer: Self-pay | Admitting: Internal Medicine

## 2017-10-22 ENCOUNTER — Other Ambulatory Visit: Payer: Self-pay | Admitting: Adult Health

## 2017-10-22 DIAGNOSIS — L97511 Non-pressure chronic ulcer of other part of right foot limited to breakdown of skin: Secondary | ICD-10-CM

## 2017-10-27 ENCOUNTER — Other Ambulatory Visit: Payer: Self-pay | Admitting: Internal Medicine

## 2017-10-27 DIAGNOSIS — Z1231 Encounter for screening mammogram for malignant neoplasm of breast: Secondary | ICD-10-CM

## 2017-10-29 ENCOUNTER — Encounter: Payer: Self-pay | Admitting: Podiatry

## 2017-10-29 ENCOUNTER — Ambulatory Visit (INDEPENDENT_AMBULATORY_CARE_PROVIDER_SITE_OTHER): Payer: Medicare Other | Admitting: Podiatry

## 2017-10-29 DIAGNOSIS — M79674 Pain in right toe(s): Secondary | ICD-10-CM

## 2017-10-29 DIAGNOSIS — B351 Tinea unguium: Secondary | ICD-10-CM

## 2017-10-29 DIAGNOSIS — E1140 Type 2 diabetes mellitus with diabetic neuropathy, unspecified: Secondary | ICD-10-CM | POA: Diagnosis not present

## 2017-10-29 DIAGNOSIS — E1149 Type 2 diabetes mellitus with other diabetic neurological complication: Secondary | ICD-10-CM

## 2017-10-29 NOTE — Progress Notes (Signed)
This patient returns to the office for preventative foot care services. She says she is not having any pain or discomfort. . She says her nails have grown thick and long.  Her nails are painful walking and wearing her shoes. She presents the office today for continued evaluation and treatment .  Her ulcer has resolved.   GENERAL APPEARANCE: Alert, conversant. Appropriately groomed. No acute distress.  VASCULAR: Pedal pulses are  palpable at  [facility] and PT bilateral.  Capillary refill time is immediate to all digits,  Normal temperature gradient.   NEUROLOGIC: sensation is normal to 5.07 monofilament at 5/5 sites bilateral.  Light touch is intact bilateral, Muscle strength normal.  MUSCULOSKELETAL: acceptable muscle strength, tone and stability bilateral.  Intrinsic muscluature intact bilateral.  Rectus appearance of foot and digits noted bilateral.   DERMATOLOGIC: skin color, texture, and turgor are within normal limits.  No preulcerative lesions or ulcers  are seen, no interdigital maceration noted.  No open lesions present.  ... No drainage noted or infection noted.  Nails  Thick disfigured discolored nail right hallux.  Absent hallux toenail left foot.     Onychomycosis  Right hallux  TX  Debridement of long thick nails.  RTC 3 months.    Gardiner Barefoot DPM

## 2017-11-03 ENCOUNTER — Other Ambulatory Visit: Payer: Self-pay | Admitting: Physician Assistant

## 2017-11-07 ENCOUNTER — Other Ambulatory Visit: Payer: Self-pay | Admitting: Physician Assistant

## 2017-11-08 ENCOUNTER — Other Ambulatory Visit: Payer: Self-pay | Admitting: Internal Medicine

## 2017-11-08 DIAGNOSIS — N183 Chronic kidney disease, stage 3 unspecified: Secondary | ICD-10-CM

## 2017-11-08 DIAGNOSIS — Z794 Long term (current) use of insulin: Principal | ICD-10-CM

## 2017-11-08 DIAGNOSIS — E1122 Type 2 diabetes mellitus with diabetic chronic kidney disease: Secondary | ICD-10-CM

## 2017-11-08 MED ORDER — FREESTYLE LANCETS MISC: 1 refills | Status: DC

## 2017-11-08 MED ORDER — GLUCOSE BLOOD VI STRP: ORAL_STRIP | 3 refills | Status: DC

## 2017-11-10 ENCOUNTER — Other Ambulatory Visit: Payer: Self-pay

## 2017-11-10 DIAGNOSIS — E1122 Type 2 diabetes mellitus with diabetic chronic kidney disease: Secondary | ICD-10-CM

## 2017-11-10 DIAGNOSIS — Z794 Long term (current) use of insulin: Principal | ICD-10-CM

## 2017-11-10 DIAGNOSIS — N183 Chronic kidney disease, stage 3 (moderate): Principal | ICD-10-CM

## 2017-11-10 MED ORDER — GLUCOSE BLOOD VI STRP: ORAL_STRIP | 3 refills | Status: DC

## 2017-11-16 ENCOUNTER — Emergency Department (HOSPITAL_COMMUNITY)
Admission: EM | Admit: 2017-11-16 | Discharge: 2017-11-16 | Disposition: A | Discharge status: Home / Self Care | Payer: Medicare Other | Attending: Emergency Medicine | Admitting: Emergency Medicine

## 2017-11-16 ENCOUNTER — Encounter (HOSPITAL_COMMUNITY): Payer: Self-pay

## 2017-11-16 ENCOUNTER — Emergency Department (HOSPITAL_COMMUNITY): Payer: Medicare Other

## 2017-11-16 DIAGNOSIS — N184 Chronic kidney disease, stage 4 (severe): Secondary | ICD-10-CM | POA: Diagnosis not present

## 2017-11-16 DIAGNOSIS — E1322 Other specified diabetes mellitus with diabetic chronic kidney disease: Secondary | ICD-10-CM | POA: Diagnosis not present

## 2017-11-16 DIAGNOSIS — Z7982 Long term (current) use of aspirin: Secondary | ICD-10-CM | POA: Diagnosis not present

## 2017-11-16 DIAGNOSIS — I129 Hypertensive chronic kidney disease with stage 1 through stage 4 chronic kidney disease, or unspecified chronic kidney disease: Secondary | ICD-10-CM | POA: Diagnosis not present

## 2017-11-16 DIAGNOSIS — H8109 Meniere's disease, unspecified ear: Secondary | ICD-10-CM | POA: Diagnosis not present

## 2017-11-16 DIAGNOSIS — Z87891 Personal history of nicotine dependence: Secondary | ICD-10-CM | POA: Insufficient documentation

## 2017-11-16 DIAGNOSIS — R55 Syncope and collapse: Secondary | ICD-10-CM | POA: Diagnosis present

## 2017-11-16 DIAGNOSIS — E039 Hypothyroidism, unspecified: Secondary | ICD-10-CM | POA: Insufficient documentation

## 2017-11-16 DIAGNOSIS — Z79899 Other long term (current) drug therapy: Secondary | ICD-10-CM | POA: Insufficient documentation

## 2017-11-16 LAB — URINALYSIS, ROUTINE W REFLEX MICROSCOPIC
Bacteria, UA: NONE SEEN
Bilirubin Urine: NEGATIVE
Glucose, UA: NEGATIVE mg/dL
Hgb urine dipstick: NEGATIVE
Ketones, ur: NEGATIVE mg/dL
Nitrite: NEGATIVE
Protein, ur: NEGATIVE mg/dL
Specific Gravity, Urine: 1.008 (ref 1.005–1.030)
pH: 5 (ref 5.0–8.0)

## 2017-11-16 LAB — CBC WITH DIFFERENTIAL/PLATELET
Abs Immature Granulocytes: 0 10*3/uL (ref 0.0–0.1)
Basophils Absolute: 0.1 10*3/uL (ref 0.0–0.1)
Basophils Relative: 1 %
Eosinophils Absolute: 0.6 10*3/uL (ref 0.0–0.7)
Eosinophils Relative: 10 %
HCT: 42 % (ref 36.0–46.0)
Hemoglobin: 13 g/dL (ref 12.0–15.0)
Immature Granulocytes: 1 %
Lymphocytes Relative: 20 %
Lymphs Abs: 1.2 10*3/uL (ref 0.7–4.0)
MCH: 27.8 pg (ref 26.0–34.0)
MCHC: 31 g/dL (ref 30.0–36.0)
MCV: 89.9 fL (ref 78.0–100.0)
Monocytes Absolute: 0.5 10*3/uL (ref 0.1–1.0)
Monocytes Relative: 8 %
Neutro Abs: 3.8 10*3/uL (ref 1.7–7.7)
Neutrophils Relative %: 60 %
Platelets: 278 10*3/uL (ref 150–400)
RBC: 4.67 MIL/uL (ref 3.87–5.11)
RDW: 16.1 % — ABNORMAL HIGH (ref 11.5–15.5)
WBC: 6.2 10*3/uL (ref 4.0–10.5)

## 2017-11-16 LAB — BASIC METABOLIC PANEL
Anion gap: 10 (ref 5–15)
BUN: 38 mg/dL — ABNORMAL HIGH (ref 8–23)
CO2: 23 mmol/L (ref 22–32)
Calcium: 10.5 mg/dL — ABNORMAL HIGH (ref 8.9–10.3)
Chloride: 106 mmol/L (ref 98–111)
Creatinine, Ser: 1.75 mg/dL — ABNORMAL HIGH (ref 0.44–1.00)
GFR calc Af Amer: 30 mL/min — ABNORMAL LOW (ref >60)
GFR calc non Af Amer: 26 mL/min — ABNORMAL LOW (ref >60)
Glucose, Bld: 119 mg/dL — ABNORMAL HIGH (ref 70–99)
Potassium: 3.8 mmol/L (ref 3.5–5.1)
Sodium: 139 mmol/L (ref 135–145)

## 2017-11-16 LAB — TROPONIN I: Troponin I: <0.03 ng/mL (ref <0.03)

## 2017-11-16 LAB — PROTIME-INR
INR: 1.16
Prothrombin Time: 14.7 seconds (ref 11.4–15.2)

## 2017-11-16 MED ORDER — MECLIZINE HCL 25 MG PO TABS
25.0000 mg | ORAL_TABLET | Freq: Once | ORAL | Status: AC
  Administered 2017-11-16: 25 mg via ORAL
  Filled 2017-11-16: qty 1

## 2017-11-16 MED ORDER — SODIUM CHLORIDE 0.9 % IV SOLN
INTRAVENOUS | Status: DC
  Administered 2017-11-16: 17:00:00 via INTRAVENOUS

## 2017-11-16 NOTE — ED Notes (Signed)
Patient transported to X-ray 

## 2017-11-16 NOTE — ED Provider Notes (Signed)
[facility] EMERGENCY DEPARTMENT Provider Note   CSN: [id number] Arrival date & time: 11/16/17  1441     History   Chief Complaint Chief Complaint  Patient presents with  . Near Syncope    HPI [name] [name] is a 84 y.o. female.  Pt presents to the ED today with near-syncope.  Pt has a hx of Meniere's disease and was sitting at her computer, felt dizzy, and fell out of the chair hitting her head on the floor.  She said she feels a little dizzy, but a lot better than earlier.  She does have low back pain and left knee pain.  The pt said this is her 2nd episode in the last two weeks.  This is unusual for her.      Past Medical History:  Diagnosis Date  . Anemia, unspecified   . Arthritis    "knees; right shoulder" (09/15/2013)  . Basal cell carcinoma    "right upper outer lip"  . DM neuropathy, type II diabetes mellitus ([place])   . Epilepsy ([place])   . GERD (gastroesophageal reflux disease)   . Gout   . High cholesterol   . Hypertension   . Hypothyroidism   . Meniere's disease   . Obesity (BMI 30-39.9)   . Other forms of epilepsy and recurrent seizures without mention of intractable epilepsy 11/04/2012   Non convulsive paroxysmal spells, responding to [facility], patient not driving.   . Pneumonia ~ 1943  . Skin cancer    "forehead; right hand"  . Type II or unspecified type diabetes mellitus without mention of complication, not stated as uncontrolled   . UTI (urinary tract infection)   . Vitamin D deficiency     Patient Active Problem List   Diagnosis Date Noted  . Gluttony 07/22/2016  . Vertigo of central origin 11/20/2015  . Morbid obesity (BMI 39.86)  ([place]) 12/12/2014  . OSA on CPAP 08/08/2014  . Type II diabetes mellitus with neurological manifestations ([place]) 09/15/2013  . Hyperlipidemia 06/27/2013  . Vitamin D deficiency 06/27/2013  . Seizure disorder ([place]) 11/04/2012  . Iron deficiency anemia 06/13/2008  . COLONIC POLYPS 06/09/2008  .  Hypothyroidism 06/09/2008  . CKD stage 4 due to type 2 diabetes mellitus ([place]) 06/09/2008  . Venous (peripheral) insufficiency 06/09/2008  . GERD w/Hx of Esophageal Stricture 06/09/2008  . Essential hypertension 06/09/2008    Past Surgical History:  Procedure Laterality Date  . CERVICAL POLYPECTOMY    . HAMMER TOE SURGERY Left 1980's  . MOHS SURGERY  20087   "right upper outer lip"  . TONSILLECTOMY  ~ 1947  . WRIST SURGERY Left ~ 1950   "ran arm thru window"     OB History   None      Home Medications    Prior to Admission medications   Medication Sig Start Date End Date Taking? Authorizing Provider  acetaminophen (TYLENOL) 500 MG tablet Take 1,000 mg by mouth every 6 (six) hours as needed for moderate pain.    [provider]  aspirin 81 MG tablet Take 81 mg by mouth daily.    [provider]  atenolol (TENORMIN) 50 MG tablet Take 1 tablet (50 mg total) by mouth daily. 03/18/17   Unk Pinto, MD  Biotin 5000 MCG TABS Take 5,000 Units by mouth 2 (two) times daily.     [provider]  Cholecalciferol (VITAMIN D3) 10000 units capsule Take 1 capsule (10,000 Units total) by mouth daily. 09/01/17   Melford Aase,  Gwyndolyn Saxon, MD  ferrous sulfate dried (SLOW FE) 160 (50 FE) MG TBCR Take 160 mg by mouth 2 (two) times daily.     [provider]  fish oil-omega-3 fatty acids 1000 MG capsule Take 2 g by mouth at bedtime.     [provider]  Flaxseed, Linseed, (FLAX SEED OIL) 1000 MG CAPS Take 1 capsule by mouth 3 (three) times daily.    [provider]  fluorouracil (EFUDEX) 5 % cream  10/07/17   [provider]  furosemide (LASIX) 40 MG tablet TAKE 1 TABLET BY MOUTH EVERY DAY 10/22/17   [name], NP  glucose blood (FREESTYLE INSULINX TEST) test strip Check glucose 3 x/day before meals 11/10/17   Vicie Mutters, PA-C  Lancets (FREESTYLE) lancets Use to check glucose 3  x/day 11/08/17   Unk Pinto, MD  lansoprazole  (PREVACID) 30 MG capsule TAKE 1 CAPSULE BY MOUTH EVERY DAY 05/25/17   Unk Pinto, MD  levETIRAcetam (KEPPRA) 500 MG tablet TAKE ONE TABLET BY MOUTH THREE TIMES DAILY AFTER MEALS. GENERIC EQUIVALENT FOR KEPPRA 10/03/17   Unk Pinto, MD  levothyroxine (SYNTHROID, LEVOTHROID) 100 MCG tablet Take 1 tablet (100 mcg total) by mouth daily. 09/22/17 09/22/18  Unk Pinto, MD  loratadine (CLARITIN) 10 MG tablet Take 10 mg by mouth daily.    [provider]  magnesium oxide (MAG-OX) 400 MG tablet Take 400 mg by mouth 3 (three) times daily.     [provider]  meclizine (ANTIVERT) 25 MG tablet Take 1 tablet (25 mg total) by mouth daily. 09/22/17   Unk Pinto, MD  OVER THE COUNTER MEDICATION Patient injects Humulin 70/30 25 units in AM, 20 units PM daily.     [provider]  potassium chloride SA (K-DUR,KLOR-CON) 20 MEQ tablet Take 1 tablet (20 mEq total) by mouth daily. 10/24/15   Forcucci, Courtney, PA-C  vitamin B-12 (CYANOCOBALAMIN) 100 MCG tablet Take 50 mcg by mouth daily.    [provider]  vitamin C (ASCORBIC ACID) 500 MG tablet Take 500 mg by mouth daily.    [provider]    Family History Family History  Problem Relation Age of Onset  . Heart disease Mother   . Stroke Mother   . Heart disease Father   . Ovarian cancer Sister   . Hypertension Son   . Hypertension Son   . Diabetes Son   . Alcohol abuse Son     Social History Social History   Tobacco Use  . Smoking status: Former Smoker    Packs/day: 1.00    Years: 30.00    Pack years: 30.00    Types: Cigarettes    Last attempt to quit: 06/29/1983    Years since quitting: 34.4  . Smokeless tobacco: Never Used  Substance Use Topics  . Alcohol use: Yes    Alcohol/week: 0.0 standard drinks    Comment: 09/15/2013 "glass of wine maybe once/year"  . Drug use: No     Allergies   Ppd [tuberculin purified protein derivative]   Review of Systems Review of Systems    Musculoskeletal: Positive for back pain.       Left knee pain  Neurological: Positive for dizziness.  All other systems reviewed and are negative.    Physical Exam Updated Vital Signs BP 139/69   Pulse 68   Resp 18   SpO2 97%   Physical Exam  Constitutional: She is oriented to person, place, and time. She appears well-developed and well-nourished.  HENT:  Head: Normocephalic and atraumatic.  Right Ear: External ear normal.  Left Ear: External ear normal.  Nose: Nose normal.  Mouth/Throat: Oropharynx is clear and moist.  Eyes: Pupils are equal, round, and reactive to light. Conjunctivae and EOM are normal.  Neck: Normal range of motion. Neck supple.  Cardiovascular: Normal rate, regular rhythm, normal heart sounds and intact distal pulses.  Pulmonary/Chest: Effort normal and breath sounds normal.  Abdominal: Soft. Bowel sounds are normal.  Musculoskeletal: Normal range of motion.       Left knee: Tenderness found.       Lumbar back: She exhibits tenderness.  Neurological: She is alert and oriented to person, place, and time.  Skin: Skin is warm. Capillary refill takes less than 2 seconds.  Psychiatric: She has a normal mood and affect. Her behavior is normal. Judgment and thought content normal.  Nursing note and vitals reviewed.    ED Treatments / Results  Labs (all labs ordered are listed, but only abnormal results are displayed) Labs Reviewed  BASIC METABOLIC PANEL - Abnormal; Notable for the following components:      Result Value   Glucose, Bld 119 (*)    BUN 38 (*)    Creatinine, Ser 1.75 (*)    Calcium 10.5 (*)    GFR calc non Af Amer 26 (*)    GFR calc Af Amer 30 (*)    All other components within normal limits  CBC WITH DIFFERENTIAL/PLATELET - Abnormal; Notable for the following components:   RDW 16.1 (*)    All other components within normal limits  URINALYSIS, ROUTINE W REFLEX MICROSCOPIC - Abnormal; Notable for the following components:   Color,  Urine STRAW (*)    Leukocytes, UA SMALL (*)    All other components within normal limits  TROPONIN I  PROTIME-INR  CBG MONITORING, ED    EKG EKG Interpretation  Date/Time:  Sunday November 16 2017 16:30:11 EDT Ventricular Rate:  66 PR Interval:    QRS Duration: 146 QT Interval:  454 QTC Calculation: 476 R Axis:   -85 Text Interpretation:  Sinus rhythm RBBB and LAFB Baseline wander in lead(s) V3 No significant change since last tracing Confirmed by Isla Pence [phone]) on 11/16/2017 5:09:50 PM   Radiology Dg Chest 2 View  Result Date: 11/16/2017 CLINICAL DATA:  Dizziness. EXAM: CHEST - 2 VIEW COMPARISON:  September 17, 2013 FINDINGS: Mild cardiomegaly. The hila and mediastinum are normal. No pulmonary nodules, masses, or focal infiltrates. Mild atelectasis in the left base. IMPRESSION: No active cardiopulmonary disease. Electronically Signed   By: Dorise Bullion III M.D   On: 11/16/2017 17:17   Dg Lumbar Spine Complete  Result Date: 11/16/2017 CLINICAL DATA:  Pain after fall EXAM: LUMBAR SPINE - COMPLETE 4+ VIEW COMPARISON:  None. FINDINGS: No fracture or traumatic malalignment. Moderate to severe lower lumbar facet degenerative changes. Atherosclerotic changes in the abdominal aorta. IMPRESSION: Moderate to severe lower facet degenerative changes. No fracture or traumatic malalignment identified. Electronically Signed   By: Dorise Bullion III M.D   On: 11/16/2017 17:25   Ct Head Wo Contrast  Result Date: 11/16/2017 CLINICAL DATA:  Meniere's disease.  Dizziness.  Fall. EXAM: CT HEAD WITHOUT CONTRAST TECHNIQUE: Contiguous axial images were obtained from the base of the skull through the vertex without intravenous contrast. COMPARISON:  None. FINDINGS: Brain: No subdural, epidural, or subarachnoid hemorrhage identified. No mass effect or midline shift. Cerebellum, brainstem, and basal cisterns are normal. Ventricles and sulci are unremarkable. Scattered white matter  changes. No evidence  of acute cortical ischemia or infarct. Vascular: No hyperdense vessel or unexpected calcification. Skull: Normal. Negative for fracture or focal lesion. Sinuses/Orbits: Mucous retention cysts in the maxillary sinuses. Mild opacification of the right frontal sinus. No air-fluid levels. Mastoid air cells and middle ears are well aerated. Other: None. IMPRESSION: No acute intracranial abnormalities identified. Electronically Signed   By: Dorise Bullion III M.D   On: 11/16/2017 17:28   Dg Knee Complete 4 Views Left  Result Date: 11/16/2017 CLINICAL DATA:  Pain after fall EXAM: LEFT KNEE - COMPLETE 4+ VIEW COMPARISON:  None. FINDINGS: Tricompartmental degenerative changes.  No fractures or effusions. IMPRESSION: Negative. Electronically Signed   By: Dorise Bullion III M.D   On: 11/16/2017 17:23    Procedures Procedures (including critical care time)  Medications Ordered in ED Medications  0.9 %  sodium chloride infusion ( Intravenous New Bag/Given 11/16/17 1717)  meclizine (ANTIVERT) tablet 25 mg (25 mg Oral Given 11/16/17 1717)     Initial Impression / Assessment and Plan / ED Course  I have reviewed the triage vital signs and the nursing notes.  Pertinent labs & imaging results that were available during my care of the patient were reviewed by me and considered in my medical decision making (see chart for details).    Pt is able to ambulate with a walker.  She has an appt with her neurologist at the end of October and will keep that appt.  The pt has vestibular rehab exercises that she can do, but has not done in awhile.  She is encouraged to do those exercises daily.  Return if worse.  Final Clinical Impressions(s) / ED Diagnoses   Final diagnoses:  Meniere's disease, unspecified laterality    ED Discharge Orders    None       Isla Pence, MD 11/16/17 [phone]

## 2017-11-16 NOTE — ED Triage Notes (Signed)
Pt arrived via [facility]; pt from home ([facility] [employer]); pt has hx of Meniere's; pt was sitting at computer and started getting dizzy; pt fell out of computer chair and hit head on the floor; pt states that she was lower back and left knee pain; Pt states thawt she is having more episode's within the last two weeks; 138/68, 70, 96% on RA, CBG 128, 16; EMS reports RBBB and pt states no knowledge.

## 2017-11-16 NOTE — Discharge Instructions (Signed)
Keep your appointment with your neurologist as scheduled in October.  Do your vestibular rehab exercises daily.

## 2017-11-19 ENCOUNTER — Ambulatory Visit
Admission: RE | Admit: 2017-11-19 | Discharge: 2017-11-19 | Disposition: A | Discharge status: Home / Self Care | Payer: Medicare Other | Source: Ambulatory Visit | Attending: Internal Medicine | Admitting: Internal Medicine

## 2017-11-19 DIAGNOSIS — Z1231 Encounter for screening mammogram for malignant neoplasm of breast: Secondary | ICD-10-CM

## 2017-12-03 NOTE — Progress Notes (Addendum)
MEDICARE ANNUAL WELLNESS VISIT AND FOLLOW UP  Assessment:    Enrolled in chronic care management  Patient/caregiver was given comprehensive care plan We will continue to monitor these goals every 3 months with an office visit and every month by a telephone call  Patient can contact the office any time with the phone number and get to a provider via the answering service or they can use Mychart.   Verbal permission was received from the patient to review comprehensive care management, they understand they have the right to stop CCM services at any time.   The patient is self managing medications at home.  Medications were reviewed with the patient today as well as adherence and potential interactions.   The patient does not need home health services at this time.   Secondary hyperparathyroidism Will refer to nephrology  Essential hypertension -     CBC with Differential/Platelet -     COMPLETE METABOLIC PANEL WITH GFR -     TSH - continue medications, DASH diet, exercise and monitor at home. Call if greater than 130/80.   OSA on CPAP Continue CPAP  CKD stage 4 due to type 2 diabetes mellitus (HCC) -     Parathyroid Hormone, Intact w/Ca Increase fluids, avoid NSAIDS, monitor sugars, will monitor  Type II diabetes mellitus with neurological manifestations (HCC) -     Hemoglobin A1c Discussed general issues about diabetes pathophysiology and management., Educational material distributed., Suggested low cholesterol diet., Encouraged aerobic exercise., Discussed foot care., Reminded to get yearly retinal exam. Get eye exam Increase by 2 units Keep food and sugar log- given to the patient Close follow up 2 weeks.   Hypothyroidism, unspecified type Hypothyroidism-check TSH level, continue medications the same, reminded to take on an empty stomach 30-107mins before food.  -     TSH  Seizure disorder ([place]) Continue meds  Type 2 diabetes mellitus with hyperlipidemia (HCC) -      Lipid panel -     rosuvastatin (CRESTOR) 5 MG tablet; Take 1 tablet (5 mg total) by mouth at bedtime.  Insulin dependent diabetes mellitus ([place]) Discussed general issues about diabetes pathophysiology and management., Educational material distributed., Suggested low cholesterol diet., Encouraged aerobic exercise., Discussed foot care., Reminded to get yearly retinal exam. Get eye exam Increase by 2 units Keep food and sugar log- given to the patient Close follow up 2 weeks.   Hyperlipidemia check lipids decrease fatty foods increase activity.  GOAL IS 70 -     Lipid panel -     rosuvastatin (CRESTOR) 5 MG tablet; Take 1 tablet (5 mg total) by mouth at bedtime.  Morbid obesity (BMI 39.86)  (HCC) - follow up 3 months for progress monitoring - increase veggies, decrease carbs - long discussion about weight loss, diet, and exercise  Iron deficiency anemia, unspecified iron deficiency anemia type -     Iron,Total/Total Iron Binding Cap  Venous (peripheral) insufficiency - weight loss discussed, start compression stockings and elevation  Benign neoplasm of colon, unspecified part of colon Monitor  Gastroesophageal reflux disease, esophagitis presence not specified Continue PPI/H2 blocker, diet discussed  Vertigo of central origin Monitor, recent normal CT WO contrast  Vitamin D deficiency Continue supplement  ACE inhibitor intolerance Due to CKD  Medication management -     Magnesium  Needs flu shot -     Flu vaccine HIGH DOSE PF  Need for prophylactic vaccination with tetanus-diphtheria (Td) -     Td vaccine greater than or equal  to 7yo preservative free IM  Encounter for Medicare annual wellness exam 1 year  Over 30 minutes of exam, counseling, chart review, and critical decision making was performed  Plan:   During the course of the visit the patient was educated and counseled about appropriate screening and preventive services including:    Pneumococcal  vaccine   Influenza vaccine  Td vaccine  Prevnar 13  Screening electrocardiogram  Screening mammography  Bone densitometry screening  Colorectal cancer screening  Diabetes screening  Glaucoma screening  Nutrition counseling   Advanced directives: given info/requested copies.    Subjective:   [name] [name] is a 84 y.o. female who presents for Medicare Annual Wellness Visit and 3 month follow up on hypertension, prediabetes, hyperlipidemia, vitamin D def.   Her blood pressure has been controlled at home, lives at [facility] home and BP checked there tuesday, today their BP is BP: 134/80.   Son drives her, she has not driven in several years. She lives at [place] home.  She had vertigo, history of meniere's, went to ER, had negative Ct head WO contrast.   She does workout. She denies chest pain, shortness of breath, dizziness.   Has been following with derm, has had [facility] continues to follow up, has follow up in Dec. She is following with ortho for left knee pain.    She is not on cholesterol medication and denies myalgias. Her cholesterol is not at goal of less than 70. The cholesterol last visit was:   Lab Results  Component Value Date   CHOL 156 08/25/2017   HDL 29 (L) 08/25/2017   LDLCALC 100 (H) 08/25/2017   TRIG 177 (H) 08/25/2017   CHOLHDL 5.4 (H) 08/25/2017   She has been working on diet and exercise for diabetes with CKD, she is on bASA, she checks her sugars, she is not on ACE/ARB due to CKD, she is OFF onglyza and MF due to kidney function, she is on 25 units of humulin in the morning and 20 at night and denies foot ulcerations, hyperglycemia, hypoglycemia , increased appetite, nausea, paresthesia of the feet, polydipsia, polyuria, visual disturbances, vomiting and weight loss. Last A1C in the office was:  Lab Results  Component Value Date   HGBA1C 8.2 (H) 08/25/2017   Last GFR Lab Results  Component Value  Date   GFRAA 30 (L) 11/16/2017   Patient is on Vitamin D supplement. Lab Results  Component Value Date   VD25OH 108 (H) 08/25/2017     BMI is Body mass index is 38.01 kg/m., she is working on diet and exercise. Wt Readings from Last 3 Encounters:  12/08/17 207 lb 12.8 oz (94.3 kg)  08/25/17 199 lb 6.4 oz (90.4 kg)  05/19/17 199 lb (90.3 kg)   She is on thyroid medication. Her medication was not changed last visit.   Lab Results  Component Value Date   TSH 1.21 08/25/2017  .  She is on Keppra due to history of seizure DO.  Medication Review Current Outpatient Medications on File Prior to Visit  Medication Sig Dispense Refill  . acetaminophen (TYLENOL) 500 MG tablet Take 1,000 mg by mouth every 6 (six) hours as needed for moderate pain.    Marland Kitchen aspirin 81 MG tablet Take 81 mg by mouth daily.    Marland Kitchen atenolol (TENORMIN) 50 MG tablet TAKE 1 TABLET BY MOUTH DAILY 90 tablet 1  . Biotin 5000 MCG TABS Take 5,000 Units by  mouth 2 (two) times daily.     . Cholecalciferol (VITAMIN D3) 10000 units capsule Take 1 capsule (10,000 Units total) by mouth daily.    . ferrous sulfate dried (SLOW FE) 160 (50 FE) MG TBCR Take 160 mg by mouth 2 (two) times daily.     . fish oil-omega-3 fatty acids 1000 MG capsule Take 2 g by mouth at bedtime.     . Flaxseed, Linseed, (FLAX SEED OIL) 1000 MG CAPS Take 1 capsule by mouth 3 (three) times daily.    . fluorouracil (EFUDEX) 5 % cream   0  . furosemide (LASIX) 40 MG tablet TAKE 1 TABLET BY MOUTH EVERY DAY 90 tablet 1  . glucose blood (FREESTYLE INSULINX TEST) test strip Check glucose 3 x/day before meals 300 each 3  . Lancets (FREESTYLE) lancets Use to check glucose 3  x/day 300 each 1  . lansoprazole (PREVACID) 30 MG capsule TAKE 1 CAPSULE BY MOUTH EVERY DAY 90 capsule 1  . levETIRAcetam (KEPPRA) 500 MG tablet TAKE ONE TABLET BY MOUTH THREE TIMES DAILY AFTER MEALS. GENERIC EQUIVALENT FOR KEPPRA 270 tablet 1  . levothyroxine (SYNTHROID, LEVOTHROID) 100 MCG  tablet Take 1 tablet (100 mcg total) by mouth daily. 90 tablet 3  . loratadine (CLARITIN) 10 MG tablet Take 10 mg by mouth daily.    . magnesium oxide (MAG-OX) 400 MG tablet Take 400 mg by mouth 3 (three) times daily.     . meclizine (ANTIVERT) 25 MG tablet Take 1 tablet (25 mg total) by mouth daily. 90 tablet 0  . OVER THE COUNTER MEDICATION Patient injects Humulin 70/30 25 units in AM, 20 units PM daily.     . potassium chloride SA (K-DUR,KLOR-CON) 20 MEQ tablet Take 1 tablet (20 mEq total) by mouth daily. 30 tablet 0  . vitamin B-12 (CYANOCOBALAMIN) 100 MCG tablet Take 50 mcg by mouth daily.    . vitamin C (ASCORBIC ACID) 500 MG tablet Take 500 mg by mouth daily.     No current facility-administered medications on file prior to visit.     Current Problems (verified) Patient Active Problem List   Diagnosis Date Noted  . Type 2 diabetes mellitus with hyperlipidemia ([place]) 12/08/2017  . Insulin dependent diabetes mellitus ([place]) 12/08/2017  . ACE inhibitor intolerance 12/08/2017  . Gluttony 07/22/2016  . Vertigo of central origin 11/20/2015  . Morbid obesity (BMI 39.86)  ([place]) 12/12/2014  . OSA on CPAP 08/08/2014  . Type II diabetes mellitus with neurological manifestations ([place]) 09/15/2013  . Hyperlipidemia 06/27/2013  . Vitamin D deficiency 06/27/2013  . Seizure disorder ([place]) 11/04/2012  . Iron deficiency anemia 06/13/2008  . COLONIC POLYPS 06/09/2008  . Hypothyroidism 06/09/2008  . CKD stage 4 due to type 2 diabetes mellitus ([place]) 06/09/2008  . Venous (peripheral) insufficiency 06/09/2008  . GERD w/Hx of Esophageal Stricture 06/09/2008  . Essential hypertension 06/09/2008    Screening Tests Immunization History  Administered Date(s) Administered  . DTaP 03/18/2007  . Influenza, High Dose Seasonal PF 10/18/2013, 12/12/2014, 10/24/2015, 10/28/2016, 12/08/2017  . Influenza-Unspecified 11/18/2012  . Pneumococcal Conjugate-13 12/12/2014  . Pneumococcal Polysaccharide-23  09/16/2013  . Td 12/08/2017  . Zoster 02/28/2005    Preventative care: Last colonoscopy: 2010 Last mammogram: 11/2017  DEXA: 2017 Echo 2015 Sleep study 2016  Prior vaccinations: TD or Tdap: 2009 DUE  Influenza: 2019 TODAY  Pneumococcal: 2015 Prevnar13: 2016 Shingles/Zostavax: 2007  Names of Other Physician/Practitioners you currently use: 1. [facility]- here for primary care  2. Dr. Ellie Lunch, eye doctor, last visit 01/2017 3. Dr. Lovena Le, dentist, last visit 2017  Patient Care Team: Unk Pinto, MD as PCP - General (Internal Medicine) Rozetta Nunnery, MD as Consulting Physician (Otolaryngology) Teena Irani, MD (Inactive) as Consulting Physician (Gastroenterology) Marcy Panning, MD as Consulting Physician (Oncology)  Allergies Allergies  Allergen Reactions  . Ppd [Tuberculin Purified Protein Derivative] Other (See Comments)    indurated    SURGICAL HISTORY She  has a past surgical history that includes Tonsillectomy (~ 1947); Wrist surgery (Left, ~ 1950); Cervical polypectomy; Hammer toe surgery (Left, 1980's); and Mohs surgery (20087). FAMILY HISTORY Her family history includes Alcohol abuse in her son; Diabetes in her son; Heart disease in her father and mother; Hypertension in her son and son; Ovarian cancer in her sister; Stroke in her mother. SOCIAL HISTORY She  reports that she quit smoking about 34 years ago. Her smoking use included cigarettes. She has a 30.00 pack-year smoking history. She has never used smokeless tobacco. She reports that she drinks alcohol. She reports that she does not use drugs.  MEDICARE WELLNESS OBJECTIVES: Physical activity:   Cardiac risk factors:   Depression/mood screen:   Depression screen [facility] 2/9 08/25/2017  Decreased Interest 0  Down, Depressed, Hopeless 0  PHQ - 2 Score 0    ADLs:  In your present state of health, do you have any difficulty performing the following activities:  08/25/2017 02/03/2017  Hearing? N N  Vision? N N  Difficulty concentrating or making decisions? N N  Walking or climbing stairs? N N  Dressing or bathing? N N  Doing errands, shopping? N N  Some recent data might be hidden     Cognitive Testing  Alert? Yes  Normal Appearance?Yes  Oriented to person? Yes  Place? Yes   Time? Yes  Recall of three objects?  2/3  Can perform simple calculations? Yes  Displays appropriate judgment?Yes  Can read the correct time from a watch face?Yes  EOL planning: Does Patient Have a Medical Advance Directive?: No Would patient like information on creating a medical advance directive?: No - Patient declined   Objective:   Today's Vitals   12/08/17 1032  BP: 134/80  Pulse: 61  Resp: 16  Temp: (!) 97.3 F (36.3 C)  SpO2: 96%  Weight: 207 lb 12.8 oz (94.3 kg)  Height: 5\' 2"  (1.575 m)  PainSc: 5   PainLoc: Knee   Body mass index is 38.01 kg/m.  General appearance: alert, no distress, WD/WN,  female HEENT: normocephalic, sclerae anicteric, TMs pearly, nares patent, no discharge or erythema, pharynx normal Oral cavity: MMM, no lesions Neck: supple, no lymphadenopathy, no thyromegaly, no masses Heart: RRR, normal S1, S2, 3/6 systolic murmur Lungs: CTA bilaterally, no wheezes, rhonchi, or rales Abdomen: +bs, soft, non tender, non distended, no masses, no hepatomegaly, no splenomegaly Musculoskeletal: nontender, no swelling, no obvious deformity Extremities: mild bilateral edema, no cyanosis, no clubbing Pulses: 2+ symmetric, upper and lower extremities, normal cap refill Neurological: alert, oriented x 3, CN2-12 intact, strength normal upper extremities and lower extremities, sensation decreased in stocking/glove distribution, no cerebellar signs, gait antalgic Psychiatric: normal affect, behavior normal, pleasant  Breast: defer Gyn: defer Rectal: defer   Medicare Attestation I have personally reviewed: The patient's medical and social  history Their use of alcohol, tobacco or illicit drugs Their current medications and supplements The patient's functional ability including ADLs,fall risks, home safety risks, cognitive, and hearing and visual impairment Diet and physical activities Evidence for  depression or mood disorders  The patient's weight, height, BMI, and visual acuity have been recorded in the chart.  I have made referrals, counseling, and provided education to the patient based on review of the above and I have provided the patient with a written personalized care plan for preventive services.     Vicie Mutters, PA-C   12/08/2017

## 2017-12-07 ENCOUNTER — Other Ambulatory Visit: Payer: Self-pay | Admitting: Internal Medicine

## 2017-12-08 ENCOUNTER — Ambulatory Visit (INDEPENDENT_AMBULATORY_CARE_PROVIDER_SITE_OTHER): Payer: Medicare Other | Admitting: Physician Assistant

## 2017-12-08 ENCOUNTER — Encounter: Payer: Self-pay | Admitting: Physician Assistant

## 2017-12-08 VITALS — BP 134/80 | HR 61 | Temp 97.3°F | Resp 16 | Ht 62.0 in | Wt 207.8 lb

## 2017-12-08 DIAGNOSIS — N2581 Secondary hyperparathyroidism of renal origin: Secondary | ICD-10-CM

## 2017-12-08 DIAGNOSIS — R6889 Other general symptoms and signs: Secondary | ICD-10-CM | POA: Diagnosis not present

## 2017-12-08 DIAGNOSIS — N184 Chronic kidney disease, stage 4 (severe): Secondary | ICD-10-CM

## 2017-12-08 DIAGNOSIS — I10 Essential (primary) hypertension: Secondary | ICD-10-CM

## 2017-12-08 DIAGNOSIS — Z9989 Dependence on other enabling machines and devices: Secondary | ICD-10-CM

## 2017-12-08 DIAGNOSIS — Z79899 Other long term (current) drug therapy: Secondary | ICD-10-CM

## 2017-12-08 DIAGNOSIS — Z789 Other specified health status: Secondary | ICD-10-CM

## 2017-12-08 DIAGNOSIS — G4733 Obstructive sleep apnea (adult) (pediatric): Secondary | ICD-10-CM | POA: Diagnosis not present

## 2017-12-08 DIAGNOSIS — E1149 Type 2 diabetes mellitus with other diabetic neurological complication: Secondary | ICD-10-CM | POA: Diagnosis not present

## 2017-12-08 DIAGNOSIS — E039 Hypothyroidism, unspecified: Secondary | ICD-10-CM

## 2017-12-08 DIAGNOSIS — G40909 Epilepsy, unspecified, not intractable, without status epilepticus: Secondary | ICD-10-CM

## 2017-12-08 DIAGNOSIS — E1122 Type 2 diabetes mellitus with diabetic chronic kidney disease: Secondary | ICD-10-CM | POA: Diagnosis not present

## 2017-12-08 DIAGNOSIS — I872 Venous insufficiency (chronic) (peripheral): Secondary | ICD-10-CM

## 2017-12-08 DIAGNOSIS — Z0001 Encounter for general adult medical examination with abnormal findings: Secondary | ICD-10-CM | POA: Diagnosis not present

## 2017-12-08 DIAGNOSIS — K219 Gastro-esophageal reflux disease without esophagitis: Secondary | ICD-10-CM

## 2017-12-08 DIAGNOSIS — E559 Vitamin D deficiency, unspecified: Secondary | ICD-10-CM

## 2017-12-08 DIAGNOSIS — E1169 Type 2 diabetes mellitus with other specified complication: Secondary | ICD-10-CM

## 2017-12-08 DIAGNOSIS — Z0000 Encounter for general adult medical examination without abnormal findings: Secondary | ICD-10-CM

## 2017-12-08 DIAGNOSIS — Z23 Encounter for immunization: Secondary | ICD-10-CM

## 2017-12-08 DIAGNOSIS — E119 Type 2 diabetes mellitus without complications: Secondary | ICD-10-CM | POA: Insufficient documentation

## 2017-12-08 DIAGNOSIS — IMO0001 Reserved for inherently not codable concepts without codable children: Secondary | ICD-10-CM

## 2017-12-08 DIAGNOSIS — E782 Mixed hyperlipidemia: Secondary | ICD-10-CM

## 2017-12-08 DIAGNOSIS — D126 Benign neoplasm of colon, unspecified: Secondary | ICD-10-CM

## 2017-12-08 DIAGNOSIS — Z794 Long term (current) use of insulin: Secondary | ICD-10-CM

## 2017-12-08 DIAGNOSIS — D509 Iron deficiency anemia, unspecified: Secondary | ICD-10-CM

## 2017-12-08 DIAGNOSIS — H814 Vertigo of central origin: Secondary | ICD-10-CM

## 2017-12-08 DIAGNOSIS — E785 Hyperlipidemia, unspecified: Secondary | ICD-10-CM

## 2017-12-08 MED ORDER — ROSUVASTATIN CALCIUM 5 MG PO TABS: 5.0000 mg | ORAL_TABLET | Freq: Every day | ORAL | 1 refills | Status: DC

## 2017-12-08 NOTE — Patient Instructions (Addendum)
Chronic Care Management  This is your comprehensive care plan.  We will continue to monitor these chronic health issues and see if we are achieving our goals every 3 months or more.  You can contact the office any time with the office phone number and get to a provider via the answering service or you can use Mychart, if you are not signed up they can do so at the front office.  Here is some information that we discussed and went over below.   Increase your insulin by 2 units in the AM and PM- keep a record of your sugars and follow up in 2-4  weeks with a sugar and food log  Get on cinnamon 1000 mg BID.   We will add on crestor 5 mg to get your cholesterol down  Statin therapy  In addition to the known lipid-lowering effects, statins are now widely accepted to have anti-inflammatory and immunomodulatory effects. Adjunctive use of statins has proven beneficial in the context of a wide range of inflammatory diseases, including rheumatoid arthritis.  Research has shown that the salutary effect of statins on cholesterol may not be their only benefit. Statin therapy has shown promise for everything from fighting viral infections to protecting the eye from cataracts.  A 2005 study of more than 700 hospital patients being treated for pneumonia found that the death rate was more than twice as high among those who were not using statins. In 2006, a [place] study examined the rate of sepsis, a deadly blood infection, among patients who had been hospitalized for heart events. In the two years after their hospitalization, the statin users had a rate of sepsis 19% lower than that of the non-statin users. A 2009 review of 22 studies found that statins appeared to have a beneficial effect on the outcome of infection, but they couldn't come to a firm conclusion.   If your morning sugar is always below 100 then the issue is with your sugar spiking after meals. Try to take your blood sugar approximately 2  hours after eating, this number should be less than 200. If it is not, think about the foods that you ate and better choices you can make.   What does your A1C results mean?  Your A1C is a measure of your sugar over the past 3 months   Use this chart as a guide to compare the results of your A1C blood test to your estimated average daily blood sugar:  A1C Range Average Sugar  4.0-6.0% 60-120 mg/dl  6.1-7.0% 121 - 150 mg/dl  7.1-8.0% 151-180 mg/dl  8.1-9.0% 181-210 mg/dl  10.1-11% 211-240 mg/dl  11.1-12.0% 271-300 mg/dl  12.1-13.0% 301-330 mg/dl  13.1-14.0% 331-360 mg/dl  Greater than 14.0% Greater than 360 mg/dl     These are the goals we discussed: Goals (1 Years of Data) as of 12/08/2017 at 11:04 AM            Today   08/25/17   05/19/17   02/03/17   12/10/16     Result Component   . HEMOGLOBIN A1C < 8.0     8.2  9.0  7.8     . LDL CALC < 70                Weight   . Weight (lb) < 200 lb (90.7 kg)   207 lb 12.8 oz (94.3 kg)  199 lb 6.4 oz (90.4 kg)  199 lb (90.3 kg)  199 lb 9.6 oz (90.5 kg)  198 lb 9.6 oz (90.1 kg)      Problem list Patient Active Problem List   Diagnosis Date Noted  . Type 2 diabetes mellitus with hyperlipidemia ([place]) 12/08/2017  . Insulin dependent diabetes mellitus ([place]) 12/08/2017  . ACE inhibitor intolerance 12/08/2017  . Gluttony 07/22/2016  . Vertigo of central origin 11/20/2015  . Morbid obesity (BMI 39.86)  ([place]) 12/12/2014  . OSA on CPAP 08/08/2014  . Type II diabetes mellitus with neurological manifestations ([place]) 09/15/2013  . Hyperlipidemia 06/27/2013  . Vitamin D deficiency 06/27/2013  . Seizure disorder ([place]) 11/04/2012  . Iron deficiency anemia 06/13/2008  . COLONIC POLYPS 06/09/2008  . Hypothyroidism 06/09/2008  . CKD stage 4 due to type 2 diabetes mellitus ([place]) 06/09/2008  . Venous (peripheral) insufficiency 06/09/2008  . GERD w/Hx of Esophageal Stricture 06/09/2008  . Essential hypertension 06/09/2008    Medication  List Current Outpatient Medications on File Prior to Visit  Medication Sig  . acetaminophen (TYLENOL) 500 MG tablet Take 1,000 mg by mouth every 6 (six) hours as needed for moderate pain.  Marland Kitchen aspirin 81 MG tablet Take 81 mg by mouth daily.  Marland Kitchen atenolol (TENORMIN) 50 MG tablet TAKE 1 TABLET BY MOUTH DAILY  . Biotin 5000 MCG TABS Take 5,000 Units by mouth 2 (two) times daily.   . Cholecalciferol (VITAMIN D3) 10000 units capsule Take 1 capsule (10,000 Units total) by mouth daily.  . ferrous sulfate dried (SLOW FE) 160 (50 FE) MG TBCR Take 160 mg by mouth 2 (two) times daily.   . fish oil-omega-3 fatty acids 1000 MG capsule Take 2 g by mouth at bedtime.   . Flaxseed, Linseed, (FLAX SEED OIL) 1000 MG CAPS Take 1 capsule by mouth 3 (three) times daily.  . fluorouracil (EFUDEX) 5 % cream   . furosemide (LASIX) 40 MG tablet TAKE 1 TABLET BY MOUTH EVERY DAY  . glucose blood (FREESTYLE INSULINX TEST) test strip Check glucose 3 x/day before meals  . Lancets (FREESTYLE) lancets Use to check glucose 3  x/day  . lansoprazole (PREVACID) 30 MG capsule TAKE 1 CAPSULE BY MOUTH EVERY DAY  . levETIRAcetam (KEPPRA) 500 MG tablet TAKE ONE TABLET BY MOUTH THREE TIMES DAILY AFTER MEALS. GENERIC EQUIVALENT FOR KEPPRA  . levothyroxine (SYNTHROID, LEVOTHROID) 100 MCG tablet Take 1 tablet (100 mcg total) by mouth daily.  Marland Kitchen loratadine (CLARITIN) 10 MG tablet Take 10 mg by mouth daily.  . magnesium oxide (MAG-OX) 400 MG tablet Take 400 mg by mouth 3 (three) times daily.   . meclizine (ANTIVERT) 25 MG tablet Take 1 tablet (25 mg total) by mouth daily.  Marland Kitchen OVER THE COUNTER MEDICATION Patient injects Humulin 70/30 25 units in AM, 20 units PM daily.   . potassium chloride SA (K-DUR,KLOR-CON) 20 MEQ tablet Take 1 tablet (20 mEq total) by mouth daily.  . vitamin B-12 (CYANOCOBALAMIN) 100 MCG tablet Take 50 mcg by mouth daily.  . vitamin C (ASCORBIC ACID) 500 MG tablet Take 500 mg by mouth daily.   No current  facility-administered medications on file prior to visit.     Allergies Allergies  Allergen Reactions  . Ppd [Tuberculin Purified Protein Derivative] Other (See Comments)    indurated    Patient Care Team Patient Care Team: Unk Pinto, MD as PCP - General (Internal Medicine) Rozetta Nunnery, MD as Consulting Physician (Otolaryngology) Teena Irani, MD (Inactive) as Consulting Physician (Gastroenterology) Marcy Panning, MD as Consulting Physician (Oncology)   This is a list of the screening recommended  for you and due dates:  Health Maintenance  Topic Date Due  . Tetanus Vaccine  03/17/2017  . Flu Shot  09/18/2017  . Eye exam for diabetics  01/29/2018  . Hemoglobin A1C  02/25/2018  . Complete foot exam   08/25/2018  . Urine Protein Check  08/26/2018  . DEXA scan (bone density measurement)  Completed  . Pneumonia vaccines  Completed     These are your follow up appointments: Future Appointments  Date Time Provider [place]  12/15/2017  8:30 AM Ward Givens, NP [facility] None  01/28/2018 10:00 AM Gardiner Barefoot, DPM [facility]  03/16/2018 10:30 AM Unk Pinto, MD [facility] None  09/21/2018 10:00 AM Unk Pinto, MD [facility] None    Diabetes is a very complicated disease...lets simplify it.  An easy way to look at it to understand the complications is if you think of the extra sugar floating in your blood stream as glass shards floating through your blood stream.    Diabetes affects your small vessels first: 1) The glass shards (sugar) scraps down the tiny blood vessels in your eyes and lead to diabetic retinopathy, the leading cause of blindness in the [place]. Diabetes is the leading cause of newly diagnosed adult (92 to 82 years of age) blindness in the [place].  2) The glass shards scratches down the tiny vessels of your legs leading to nerve damage called neuropathy and can lead to amputations of your feet. More than 60% of all  non-traumatic amputations of lower limbs occur in people with diabetes.  3) Over time the small vessels in your brain are shredded and closed off, individually this does not cause any problems but over a long period of time many of the small vessels being blocked can lead to Vascular Dementia.   4) Your kidney's are a filter system and have a "net" that keeps certain things in the body and lets bad things out. Sugar shreds this net and leads to kidney damage and eventually failure. Decreasing the sugar that is destroying the net and certain blood pressure medications can help stop or decrease progression of kidney disease. Diabetes was the primary cause of kidney failure in 44 percent of all new cases in 2011.  5) Diabetes also destroys the small vessels in your penis that lead to erectile dysfunction. Eventually the vessels are so damaged that you may not be responsive to cialis or viagra.   Diabetes and your large vessels: Your larger vessels consist of your coronary arteries in your heart and the carotid vessels to your brain. Diabetes or even increased sugars put you at 300% increased risk of heart attack and stroke and this is why.. The sugar scrapes down your large blood vessels and your body sees this as an internal injury and tries to repair itself. Just like you get a scab on your skin, your platelets will stick to the blood vessel wall trying to heal it. This is why we have diabetics on low dose aspirin daily, this prevents the platelets from sticking and can prevent plaque formation. In addition, your body takes cholesterol and tries to shove it into the open wound. This is why we want your LDL, or bad cholesterol, below 70.   The combination of platelets and cholesterol over 5-10 years forms plaque that can break off and cause a heart attack or stroke.   PLEASE REMEMBER:  Diabetes is preventable! Up to 38 percent of complications and morbidities among individuals with type 2 diabetes can  be prevented, delayed, or effectively treated and minimized with regular visits to a health professional, appropriate monitoring and medication, and a healthy diet and lifestyle.   Diabetes or even increased sugars put you at 300% increased risk of heart attack and stroke.  ALSO BEING DIABETIC YOU MAY NOT HAVE ANY PAIN WITH A HEART ATTACK.  Even worse of a chance of no pain if you are a woman.  It is very unlikely that you will have any pain with a heart attack. Likely your symptoms will be very subtle, even for very severe disease.  Your symptoms for a heart attack will likely occur when you exert your self or exercise and include: Shortness of breath Sweating Nausea Dizziness Fast or irregular heart beats Fatigue   It makes me feel better if my diabetics get their heart rate up with exercise once or twice a week and pay close attention to your body. If there is ANY change in your exercise capacity or if you have symptoms above, please STOP and call 911 or call to come to the office.   PLEASE REMEMBER:  Diabetes is preventable! Up to 86 percent of complications and morbidities among individuals with type 2 diabetes can be prevented, delayed, or effectively treated and minimized with regular visits to a health professional, appropriate monitoring and medication, and a healthy diet and lifestyle.   Here is some information to help you keep your heart healthy: Move it! - Aim for 30 mins of activity every day. Take it slowly at first. Talk to [place] before starting any new exercise program.   Lose it.  -Body Mass Index (BMI) can indicate if you need to lose weight. A healthy range is 18.5-24.9. For a BMI calculator, go to [employer]  Waist Management -Excess abdominal fat is a risk factor for heart disease, diabetes, asthma, stroke and more. Ideal waist circumference is less than 35" for women and less than 40" for men.   Eat Right -focus on fruits, vegetables, whole grains, and meals you  make yourself. Avoid foods with trans fat and high sugar/sodium content.   Snooze or Snore? - Loud snoring can be a sign of sleep apnea, a significant risk factor for high blood pressure, heart attach, stroke, and heart arrhythmias.  Kick the habit -Quit Smoking! Avoid second hand smoke. A single cigarette raises your blood pressure for 20 mins and increases the risk of heart attack and stroke for the next 24 hours.   Are Aspirin and Supplements right for you? -Add ENTERIC COATED low dose 81 mg Aspirin daily OR can do every other day if you have easy bruising to protect your heart and head. As well as to reduce risk of Colon Cancer by 20 %, Skin Cancer by 26 % , Melanoma by 46% and Pancreatic cancer by 60%  Say "No to Stress -There may be little you can do about problems that cause stress. However, techniques such as long walks, meditation, and exercise can help you manage it.   Start Now! - Make changes one at a time and set reasonable goals to increase your likelihood of success.       When it comes to diets, agreement about the perfect plan isn't easy to find, even among the experts. Experts at the [employer] developed an idea known as the Healthy Eating Plate. Just imagine a plate divided into logical, healthy portions.  The emphasis is on diet quality:  Load up on vegetables and fruits - one-half  of your plate: Aim for color and variety, and remember that potatoes don't count.  Go for whole grains - one-quarter of your plate: Whole wheat, barley, wheat berries, quinoa, oats, brown rice, and foods made with them. If you want pasta, go with whole wheat pasta.  Protein power - one-quarter of your plate: Fish, chicken, beans, and nuts are all healthy, versatile protein sources. Limit red meat.  The diet, however, does go beyond the plate, offering a few other suggestions.  Use healthy plant oils, such as olive, canola, soy, corn, sunflower and peanut. Check the  labels, and avoid partially hydrogenated oil, which have unhealthy trans fats.  If you're thirsty, drink water. Coffee and tea are good in moderation, but skip sugary drinks and limit milk and dairy products to one or two daily servings.  The type of carbohydrate in the diet is more important than the amount. Some sources of carbohydrates, such as vegetables, fruits, whole grains, and beans-are healthier than others.  Finally, stay active.

## 2017-12-09 LAB — COMPLETE METABOLIC PANEL WITH GFR
AG Ratio: 1.9 (calc) (ref 1.0–2.5)
ALT: 21 U/L (ref 6–29)
AST: 16 U/L (ref 10–35)
Albumin: 4.6 g/dL (ref 3.6–5.1)
Alkaline phosphatase (APISO): 71 U/L (ref 33–130)
BUN/Creatinine Ratio: 24 (calc) — ABNORMAL HIGH (ref 6–22)
BUN: 45 mg/dL — ABNORMAL HIGH (ref 7–25)
CO2: 25 mmol/L (ref 20–32)
Calcium: 10.9 mg/dL — ABNORMAL HIGH (ref 8.6–10.4)
Chloride: 106 mmol/L (ref 98–110)
Creat: 1.85 mg/dL — ABNORMAL HIGH (ref 0.60–0.88)
GFR, Est African American: 29 mL/min/{1.73_m2} — ABNORMAL LOW (ref >=60)
GFR, Est Non African American: 25 mL/min/{1.73_m2} — ABNORMAL LOW (ref >=60)
Globulin: 2.4 g/dL (calc) (ref 1.9–3.7)
Glucose, Bld: 186 mg/dL — ABNORMAL HIGH (ref 65–99)
Potassium: 4.3 mmol/L (ref 3.5–5.3)
Sodium: 141 mmol/L (ref 135–146)
Total Bilirubin: 0.3 mg/dL (ref 0.2–1.2)
Total Protein: 7 g/dL (ref 6.1–8.1)

## 2017-12-09 LAB — CBC WITH DIFFERENTIAL/PLATELET
Basophils Absolute: 40 cells/uL (ref 0–200)
Basophils Relative: 0.6 %
Eosinophils Absolute: 529 cells/uL — ABNORMAL HIGH (ref 15–500)
Eosinophils Relative: 7.9 %
HCT: 38.5 % (ref 35.0–45.0)
Hemoglobin: 12.3 g/dL (ref 11.7–15.5)
Lymphs Abs: 1487 cells/uL (ref 850–3900)
MCH: 27.3 pg (ref 27.0–33.0)
MCHC: 31.9 g/dL — ABNORMAL LOW (ref 32.0–36.0)
MCV: 85.4 fL (ref 80.0–100.0)
MPV: 10.5 fL (ref 7.5–12.5)
Monocytes Relative: 7.5 %
Neutro Abs: 4141 cells/uL (ref 1500–7800)
Neutrophils Relative %: 61.8 %
Platelets: 274 10*3/uL (ref 140–400)
RBC: 4.51 10*6/uL (ref 3.80–5.10)
RDW: 16.3 % — ABNORMAL HIGH (ref 11.0–15.0)
Total Lymphocyte: 22.2 %
WBC mixed population: 503 cells/uL (ref 200–950)
WBC: 6.7 10*3/uL (ref 3.8–10.8)

## 2017-12-09 LAB — HEMOGLOBIN A1C
Hgb A1c MFr Bld: 9 % of total Hgb — ABNORMAL HIGH (ref <5.7)
Mean Plasma Glucose: 212 (calc)
eAG (mmol/L): 11.7 (calc)

## 2017-12-09 LAB — LIPID PANEL
Cholesterol: 157 mg/dL (ref <200)
HDL: 28 mg/dL — ABNORMAL LOW (ref >50)
LDL Cholesterol (Calc): 92 mg/dL (calc)
Non-HDL Cholesterol (Calc): 129 mg/dL (calc) (ref <130)
Total CHOL/HDL Ratio: 5.6 (calc) — ABNORMAL HIGH (ref <5.0)
Triglycerides: 285 mg/dL — ABNORMAL HIGH (ref <150)

## 2017-12-09 LAB — IRON, TOTAL/TOTAL IRON BINDING CAP
%SAT: 39 % (calc) (ref 16–45)
Iron: 97 ug/dL (ref 45–160)
TIBC: 248 mcg/dL (calc) — ABNORMAL LOW (ref 250–450)

## 2017-12-09 LAB — TSH: TSH: 1.01 mIU/L (ref 0.40–4.50)

## 2017-12-09 LAB — MAGNESIUM: Magnesium: 2.2 mg/dL (ref 1.5–2.5)

## 2017-12-09 LAB — PTH, INTACT AND CALCIUM
Calcium: 10.9 mg/dL — ABNORMAL HIGH (ref 8.6–10.4)
PTH: 84 pg/mL — ABNORMAL HIGH (ref 14–64)

## 2017-12-10 ENCOUNTER — Encounter: Payer: Self-pay | Admitting: Internal Medicine

## 2017-12-10 NOTE — Addendum Note (Signed)
Addended by: Vicie Mutters R on: 12/10/2017 09:00 AM   Modules accepted: Orders

## 2017-12-15 ENCOUNTER — Other Ambulatory Visit: Payer: Self-pay

## 2017-12-15 ENCOUNTER — Telehealth: Payer: Self-pay | Admitting: Internal Medicine

## 2017-12-15 ENCOUNTER — Encounter: Payer: Self-pay | Admitting: Adult Health

## 2017-12-15 ENCOUNTER — Ambulatory Visit: Payer: Medicare Other | Admitting: Adult Health

## 2017-12-15 VITALS — BP 142/74 | HR 70 | Ht 62.0 in | Wt 208.6 lb

## 2017-12-15 DIAGNOSIS — R42 Dizziness and giddiness: Secondary | ICD-10-CM | POA: Diagnosis not present

## 2017-12-15 DIAGNOSIS — R569 Unspecified convulsions: Secondary | ICD-10-CM

## 2017-12-15 MED ORDER — ATENOLOL 50 MG PO TABS: 50.0000 mg | ORAL_TABLET | Freq: Every day | ORAL | 1 refills | Status: DC

## 2017-12-15 NOTE — Progress Notes (Signed)
PATIENT: [name] DOB: Aug 25, 1932  REASON FOR VISIT: follow up- seizures HISTORY FROM: patient  HISTORY OF PRESENT ILLNESS: Today 12/15/17:  Ms. Poplar is an 84 year old female with a history of seizures.  She returns today for follow-up.  She denies any seizure events.  She continues on Keppra 500 mg 3 times a day.  Denies any significant changes with her mood or behavior.  No changes in her gait or balance.  She continues to reside at [facility].  She reports that on September 29 she had a episode of dizziness as well as syncope.  She had a RN onsite come check on her but she did go to the hospital.  She was diagnosed with vertigo and was told to do the exercises that she learned from vestibular rehab.  He has not had any additional events.  She returns today for an evaluation.  HISTORY Ms. Moxey is an 84 year old female with a history of seizures. She returns today for follow-up. She remains on Keppra taking 500 mg 3 times a day. She denies any seizure events. She continues to live at [facility]. Denies any changes with her gait or balance. Denies any changes with her mood. She states that her dizziness did improve on Keppra although she does notice mild dizziness with weather changes. She is able to complete all ADLs independently. She does not operate a motor vehicle. She does have a boot on the right foot after an infection from a pedicure. She returns today for an evaluation.   REVIEW OF SYSTEMS: Out of a complete 14 system review of symptoms, the patient complains only of the following symptoms, and all other reviewed systems are negative.  See HPI  ALLERGIES: Allergies  Allergen Reactions  . Ppd [Tuberculin Purified Protein Derivative] Other (See Comments)    indurated    HOME MEDICATIONS: Outpatient Medications Prior to Visit  Medication Sig Dispense Refill  . acetaminophen (TYLENOL) 500 MG tablet Take 1,000 mg by mouth every 6 (six) hours as needed for moderate  pain.    Marland Kitchen aspirin 81 MG tablet Take 81 mg by mouth daily.    Marland Kitchen atenolol (TENORMIN) 50 MG tablet TAKE 1 TABLET BY MOUTH DAILY 90 tablet 1  . Biotin 5000 MCG TABS Take 5,000 Units by mouth 2 (two) times daily.     . Cholecalciferol (VITAMIN D3) 10000 units capsule Take 1 capsule (10,000 Units total) by mouth daily.    . ferrous sulfate dried (SLOW FE) 160 (50 FE) MG TBCR Take 160 mg by mouth 2 (two) times daily.     . fish oil-omega-3 fatty acids 1000 MG capsule Take 2 g by mouth at bedtime.     . Flaxseed, Linseed, (FLAX SEED OIL) 1000 MG CAPS Take 1 capsule by mouth 3 (three) times daily.    . fluorouracil (EFUDEX) 5 % cream   0  . furosemide (LASIX) 40 MG tablet TAKE 1 TABLET BY MOUTH EVERY DAY 90 tablet 1  . glucose blood (FREESTYLE INSULINX TEST) test strip Check glucose 3 x/day before meals 300 each 3  . Lancets (FREESTYLE) lancets Use to check glucose 3  x/day 300 each 1  . lansoprazole (PREVACID) 30 MG capsule TAKE 1 CAPSULE BY MOUTH EVERY DAY 90 capsule 1  . levETIRAcetam (KEPPRA) 500 MG tablet TAKE ONE TABLET BY MOUTH THREE TIMES DAILY AFTER MEALS. GENERIC EQUIVALENT FOR KEPPRA 270 tablet 1  . levothyroxine (SYNTHROID, LEVOTHROID) 100 MCG tablet Take 1 tablet (100 mcg total) by mouth  daily. 90 tablet 3  . loratadine (CLARITIN) 10 MG tablet Take 10 mg by mouth daily.    . magnesium oxide (MAG-OX) 400 MG tablet Take 400 mg by mouth 3 (three) times daily.     . meclizine (ANTIVERT) 25 MG tablet Take 1 tablet (25 mg total) by mouth daily. 90 tablet 0  . OVER THE COUNTER MEDICATION Patient injects Humulin 70/30 25 units in AM, 20 units PM daily.     . potassium chloride SA (K-DUR,KLOR-CON) 20 MEQ tablet Take 1 tablet (20 mEq total) by mouth daily. 30 tablet 0  . rosuvastatin (CRESTOR) 5 MG tablet Take 1 tablet (5 mg total) by mouth at bedtime. 90 tablet 1  . vitamin B-12 (CYANOCOBALAMIN) 100 MCG tablet Take 50 mcg by mouth daily.    . vitamin C (ASCORBIC ACID) 500 MG tablet Take 500 mg by  mouth daily.     No facility-administered medications prior to visit.     PAST MEDICAL HISTORY: Past Medical History:  Diagnosis Date  . Anemia, unspecified   . Arthritis    "knees; right shoulder" (09/15/2013)  . Basal cell carcinoma    "right upper outer lip"  . DM neuropathy, type II diabetes mellitus ([place])   . Epilepsy ([place])   . GERD (gastroesophageal reflux disease)   . Gout   . High cholesterol   . Hypertension   . Hypothyroidism   . Meniere's disease   . Obesity (BMI 30-39.9)   . Other forms of epilepsy and recurrent seizures without mention of intractable epilepsy 11/04/2012   Non convulsive paroxysmal spells, responding to [facility], patient not driving.   . Pneumonia ~ 1943  . Skin cancer    "forehead; right hand"  . Type II or unspecified type diabetes mellitus without mention of complication, not stated as uncontrolled   . UTI (urinary tract infection)   . Vitamin D deficiency     PAST SURGICAL HISTORY: Past Surgical History:  Procedure Laterality Date  . CERVICAL POLYPECTOMY    . HAMMER TOE SURGERY Left 1980's  . MOHS SURGERY  20087   "right upper outer lip"  . TONSILLECTOMY  ~ 1947  . WRIST SURGERY Left ~ 1950   "ran arm thru window"    FAMILY HISTORY: Family History  Problem Relation Age of Onset  . Heart disease Mother   . Stroke Mother   . Heart disease Father   . Ovarian cancer Sister   . Hypertension Son   . Hypertension Son   . Diabetes Son   . Alcohol abuse Son     SOCIAL HISTORY: Social History   Socioeconomic History  . Marital status: Widowed    Spouse name: Not on file  . Number of children: 3  . Years of education: 55  . Highest education level: Not on file  Occupational History  . Occupation: retired  Scientific laboratory technician  . Financial resource strain: Not on file  . Food insecurity:    Worry: Not on file    Inability: Not on file  . Transportation needs:    Medical: Not on file    Non-medical: Not on file  Tobacco Use  .  Smoking status: Former Smoker    Packs/day: 1.00    Years: 30.00    Pack years: 30.00    Types: Cigarettes    Last attempt to quit: 06/29/1983    Years since quitting: 34.4  . Smokeless tobacco: Never Used  Substance and Sexual Activity  . Alcohol use: Yes  Alcohol/week: 0.0 standard drinks    Comment: 09/15/2013 "glass of wine maybe once/year"  . Drug use: No  . Sexual activity: Not Currently  Lifestyle  . Physical activity:    Days per week: Not on file    Minutes per session: Not on file  . Stress: Not on file  Relationships  . Social connections:    Talks on phone: Not on file    Gets together: Not on file    Attends religious service: Not on file    Active member of club or organization: Not on file    Attends meetings of clubs or organizations: Not on file    Relationship status: Not on file  . Intimate partner violence:    Fear of current or ex partner: Not on file    Emotionally abused: Not on file    Physically abused: Not on file    Forced sexual activity: Not on file  Other Topics Concern  . Not on file  Social History Narrative   Patient lives at home alone and she is widowed.   Retired.   Education some college.   Right handed.   Caffeine sometimes not daily.       PHYSICAL EXAM  Vitals:   12/15/17 0807  BP: (!) 142/74  Pulse: 70  Weight: 208 lb 9.6 oz (94.6 kg)  Height: 5\' 2"  (1.575 m)   Body mass index is 38.15 kg/m.  Generalized: Well developed, in no acute distress   Neurological examination  Mentation: Alert oriented to time, place, history taking. Follows all commands speech and language fluent Cranial nerve II-XII:  Extraocular movements were full, visual field were full on confrontational test. Facial sensation and strength were normal. Uvula tongue midline. Head turning and shoulder shrug  were normal and symmetric. Motor: The motor testing reveals 5 over 5 strength of all 4 extremities. Good symmetric motor tone is noted throughout.    Sensory: Sensory testing is intact to soft touch on all 4 extremities. No evidence of extinction is noted.  Coordination: Cerebellar testing reveals good finger-nose-finger and heel-to-shin bilaterally.  Gait and station: Gait is normal. Tandem gait not attempted.   DIAGNOSTIC DATA (LABS, IMAGING, TESTING) - I reviewed patient records, labs, notes, testing and imaging myself where available.  Lab Results  Component Value Date   WBC 6.7 12/08/2017   HGB 12.3 12/08/2017   HCT 38.5 12/08/2017   MCV 85.4 12/08/2017   PLT 274 12/08/2017      Component Value Date/Time   NA 141 12/08/2017 1114   K 4.3 12/08/2017 1114   CL 106 12/08/2017 1114   CO2 25 12/08/2017 1114   GLUCOSE 186 (H) 12/08/2017 1114   BUN 45 (H) 12/08/2017 1114   CREATININE 1.85 (H) 12/08/2017 1114   CALCIUM 10.9 (H) 12/08/2017 1114   CALCIUM 10.9 (H) 12/08/2017 1114   PROT 7.0 12/08/2017 1114   ALBUMIN 4.5 07/22/2016 1337   AST 16 12/08/2017 1114   ALT 21 12/08/2017 1114   ALKPHOS 41 07/22/2016 1337   BILITOT 0.3 12/08/2017 1114   GFRNONAA 25 (L) 12/08/2017 1114   GFRAA 29 (L) 12/08/2017 1114   Lab Results  Component Value Date   CHOL 157 12/08/2017   HDL 28 (L) 12/08/2017   LDLCALC 92 12/08/2017   TRIG 285 (H) 12/08/2017   CHOLHDL 5.6 (H) 12/08/2017   Lab Results  Component Value Date   HGBA1C 9.0 (H) 12/08/2017   Lab Results  Component Value Date   VITAMINB12  1082 (H) 09/24/2010   Lab Results  Component Value Date   TSH 1.01 12/08/2017      ASSESSMENT AND PLAN 84 y.o. year old female  has a past medical history of Anemia, unspecified, Arthritis, Basal cell carcinoma, DM neuropathy, type II diabetes mellitus ([place]), Epilepsy ([place]), GERD (gastroesophageal reflux disease), Gout, High cholesterol, Hypertension, Hypothyroidism, Meniere's disease, Obesity (BMI 30-39.9), Other forms of epilepsy and recurrent seizures without mention of intractable epilepsy (11/04/2012), Pneumonia (~ 1943), Skin  cancer, Type II or unspecified type diabetes mellitus without mention of complication, not stated as uncontrolled, UTI (urinary tract infection), and Vitamin D deficiency. here with:  1.  Seizures 2. vertigo  Overall the patient is doing well.  She will continue on Keppra 500 mg 3 times a day.  She is advised that if her symptoms worsen or she develops new symptoms she should let us know.  If she has any additional episodes of dizziness or syncopal event she should let us know.  She will follow-up in 1 year or sooner if needed.   I spent 15 minutes with the patient. 50% of this time was spent reviewing plan of care  Ward Givens, MSN, NP-C 12/15/2017, 8:02 AM [facility] [address], [place], [place] [address] [phone]

## 2017-12-15 NOTE — Patient Instructions (Signed)
Your Plan:  Continue Keppra 500 mg three times a day If your symptoms worsen or you develop new symptoms please let us know.   Thank you for coming to see [place] at [facility]. I hope we have been able to provide you high quality care today.  You may receive a patient satisfaction survey over the next few weeks. We would appreciate your feedback and comments so that we may continue to improve ourselves and the health of our patients.

## 2017-12-15 NOTE — Telephone Encounter (Signed)
Patient called to request,  recieved letter from [place]. she needed to contact md for new rx Atenolol to [employer] [facility]

## 2017-12-15 NOTE — Telephone Encounter (Signed)
Sent to me in error. 

## 2017-12-23 ENCOUNTER — Encounter: Payer: Self-pay | Admitting: *Deleted

## 2017-12-23 ENCOUNTER — Other Ambulatory Visit: Payer: Self-pay | Admitting: Internal Medicine

## 2018-01-11 NOTE — Progress Notes (Deleted)
Assessment and Plan:  There are no diagnoses linked to this encounter.  CKD stage 4 due to type 2 diabetes mellitus (HCC) -     Parathyroid Hormone, Intact w/Ca Increase fluids, avoid NSAIDS, monitor sugars, will monitor  Type II diabetes mellitus with neurological manifestations (HCC) -     Hemoglobin A1c Discussed general issues about diabetes pathophysiology and management., Educational material distributed., Suggested low cholesterol diet., Encouraged aerobic exercise., Discussed foot care., Reminded to get yearly retinal exam. Get eye exam Increase by 2 units Keep food and sugar log- given to the patient Close follow up 2 weeks.    Type 2 diabetes mellitus with hyperlipidemia (HCC) -     Lipid panel -     rosuvastatin (CRESTOR) 5 MG tablet; Take 1 tablet (5 mg total) by mouth at bedtime.  Insulin dependent diabetes mellitus ([place]) Discussed general issues about diabetes pathophysiology and management., Educational material distributed., Suggested low cholesterol diet., Encouraged aerobic exercise., Discussed foot care., Reminded to get yearly retinal exam. Get eye exam Increase by 2 units Keep food and sugar log- given to the patient Close follow up 2 weeks.     Further disposition pending results of labs. Discussed med's effects and SE's.   Over 30 minutes of exam, counseling, chart review, and critical decision making was performed.   Future Appointments  Date Time Provider [place]  01/12/2018  9:30 AM Vicie Mutters, PA-C [facility] None  01/28/2018 10:00 AM Gardiner Barefoot, DPM [facility]  03/16/2018 10:30 AM Unk Pinto, MD [facility] None  09/21/2018 10:00 AM Unk Pinto, MD [facility] None  12/17/2018  9:30 AM Ward Givens, NP [facility] None  12/18/2018 10:30 AM Vicie Mutters, PA-C [facility] None    ------------------------------------------------------------------------------------------------------------------   HPI 83  y.o.female presents for  84 y.o. year old female  has a past medical history of Anemia, unspecified, Arthritis, Basal cell carcinoma, DM neuropathy, type II diabetes mellitus ([place]), Epilepsy ([place]), GERD (gastroesophageal reflux disease), Gout, High cholesterol, Hypertension, Hypothyroidism, Meniere's disease, Obesity (BMI 30-39.9), Other forms of epilepsy and recurrent seizures without mention of intractable epilepsy (11/04/2012), Pneumonia (~ 1943), Skin cancer, Type II or unspecified type diabetes mellitus without mention of complication, not stated as uncontrolled, UTI (urinary tract infection), and Vitamin D deficiency. She follows with nuerology for vertigo and seizures with last appointment on 12/15/17 and next in one year.  Past Medical History:  Diagnosis Date  . Anemia, unspecified   . Arthritis    "knees; right shoulder" (09/15/2013)  . Basal cell carcinoma    "right upper outer lip"  . DM neuropathy, type II diabetes mellitus ([place])   . Epilepsy ([place])   . GERD (gastroesophageal reflux disease)   . Gout   . High cholesterol   . Hypertension   . Hypothyroidism   . Meniere's disease   . Obesity (BMI 30-39.9)   . Other forms of epilepsy and recurrent seizures without mention of intractable epilepsy 11/04/2012   Non convulsive paroxysmal spells, responding to [facility], patient not driving.   . Pneumonia ~ 1943  . Skin cancer    "forehead; right hand"  . Type II or unspecified type diabetes mellitus without mention of complication, not stated as uncontrolled   . UTI (urinary tract infection)   . Vitamin D deficiency      Allergies  Allergen Reactions  . Ppd [Tuberculin Purified Protein Derivative] Other (See Comments)    indurated    Current Outpatient Medications on File Prior to Visit  Medication Sig  .  acetaminophen (TYLENOL) 500 MG tablet Take 1,000 mg by mouth every 6 (six) hours as needed for moderate pain.  Marland Kitchen aspirin 81 MG tablet Take 81 mg by mouth daily.  Marland Kitchen atenolol  (TENORMIN) 50 MG tablet Take 1 tablet (50 mg total) by mouth daily.  . Biotin 5000 MCG TABS Take 5,000 Units by mouth 2 (two) times daily.   . Cholecalciferol (VITAMIN D3) 10000 units capsule Take 1 capsule (10,000 Units total) by mouth daily.  . ferrous sulfate dried (SLOW FE) 160 (50 FE) MG TBCR Take 160 mg by mouth 2 (two) times daily.   . fish oil-omega-3 fatty acids 1000 MG capsule Take 2 g by mouth at bedtime.   . Flaxseed, Linseed, (FLAX SEED OIL) 1000 MG CAPS Take 1 capsule by mouth 3 (three) times daily.  . furosemide (LASIX) 40 MG tablet TAKE 1 TABLET BY MOUTH EVERY DAY  . glucose blood (FREESTYLE INSULINX TEST) test strip Check glucose 3 x/day before meals  . Lancets (FREESTYLE) lancets Use to check glucose 3  x/day  . Lancets (FREESTYLE) lancets USE 1 LANCET TO CHECK GLUCOSE IN THE MORNING  . lansoprazole (PREVACID) 30 MG capsule TAKE 1 CAPSULE BY MOUTH EVERY DAY  . levETIRAcetam (KEPPRA) 500 MG tablet TAKE ONE TABLET BY MOUTH THREE TIMES DAILY AFTER MEALS. GENERIC EQUIVALENT FOR KEPPRA  . levothyroxine (SYNTHROID, LEVOTHROID) 100 MCG tablet Take 1 tablet (100 mcg total) by mouth daily.  Marland Kitchen loratadine (CLARITIN) 10 MG tablet Take 10 mg by mouth daily.  . magnesium oxide (MAG-OX) 400 MG tablet Take 400 mg by mouth 3 (three) times daily.   . meclizine (ANTIVERT) 25 MG tablet TAKE 1 TABLET BY MOUTH DAILY  . OVER THE COUNTER MEDICATION Patient injects Humulin 70/30 25 units in AM, 20 units PM daily.   . potassium chloride SA (K-DUR,KLOR-CON) 20 MEQ tablet Take 1 tablet (20 mEq total) by mouth daily.  . rosuvastatin (CRESTOR) 5 MG tablet Take 1 tablet (5 mg total) by mouth at bedtime.  . vitamin B-12 (CYANOCOBALAMIN) 100 MCG tablet Take 50 mcg by mouth daily.  . vitamin C (ASCORBIC ACID) 500 MG tablet Take 500 mg by mouth daily.   No current facility-administered medications on file prior to visit.     ROS: all negative except above.   Physical Exam:  There were no vitals taken for  this visit.  General Appearance: Well nourished, in no apparent distress. Eyes: PERRLA, EOMs, conjunctiva no swelling or erythema Sinuses: No Frontal/maxillary tenderness ENT/Mouth: Ext aud canals clear, TMs without erythema, bulging. No erythema, swelling, or exudate on post pharynx.  Tonsils not swollen or erythematous. Hearing normal.  Neck: Supple, thyroid normal.  Respiratory: Respiratory effort normal, BS equal bilaterally without rales, rhonchi, wheezing or stridor.  Cardio: RRR with no MRGs. Brisk peripheral pulses without edema.  Abdomen: Soft, + BS.  Non tender, no guarding, rebound, hernias, masses. Lymphatics: Non tender without lymphadenopathy.  Musculoskeletal: Full ROM, 5/5 strength, normal gait.  Skin: Warm, dry without rashes, lesions, ecchymosis.  Neuro: Cranial nerves intact. Normal muscle tone, no cerebellar symptoms. Sensation intact.  Psych: Awake and oriented X 3, normal affect, Insight and Judgment appropriate.     [name], NP 9:36 PM [facility]

## 2018-01-12 ENCOUNTER — Ambulatory Visit (INDEPENDENT_AMBULATORY_CARE_PROVIDER_SITE_OTHER): Payer: Medicare Other | Admitting: Adult Health Nurse Practitioner

## 2018-01-12 ENCOUNTER — Encounter: Payer: Self-pay | Admitting: Adult Health Nurse Practitioner

## 2018-01-12 VITALS — BP 138/76 | HR 68 | Temp 97.3°F | Ht 62.0 in | Wt 210.8 lb

## 2018-01-12 DIAGNOSIS — E1169 Type 2 diabetes mellitus with other specified complication: Secondary | ICD-10-CM | POA: Diagnosis not present

## 2018-01-12 DIAGNOSIS — IMO0001 Reserved for inherently not codable concepts without codable children: Secondary | ICD-10-CM

## 2018-01-12 DIAGNOSIS — Z794 Long term (current) use of insulin: Secondary | ICD-10-CM

## 2018-01-12 DIAGNOSIS — E1149 Type 2 diabetes mellitus with other diabetic neurological complication: Secondary | ICD-10-CM

## 2018-01-12 DIAGNOSIS — E1122 Type 2 diabetes mellitus with diabetic chronic kidney disease: Secondary | ICD-10-CM

## 2018-01-12 DIAGNOSIS — N184 Chronic kidney disease, stage 4 (severe): Secondary | ICD-10-CM

## 2018-01-12 DIAGNOSIS — E1165 Type 2 diabetes mellitus with hyperglycemia: Secondary | ICD-10-CM

## 2018-01-12 DIAGNOSIS — E785 Hyperlipidemia, unspecified: Secondary | ICD-10-CM

## 2018-01-12 DIAGNOSIS — E119 Type 2 diabetes mellitus without complications: Secondary | ICD-10-CM

## 2018-01-12 MED ORDER — ROSUVASTATIN CALCIUM 5 MG PO TABS: 5.0000 mg | ORAL_TABLET | Freq: Every day | ORAL | 1 refills | Status: DC

## 2018-01-12 NOTE — Progress Notes (Signed)
4 Week Follow Up: Review food and glucose log Assessment and Plan:  There are no diagnoses linked to this encounter.  CKD stage 4 due to type 2 diabetes mellitus (HCC) Monitor medications and dosing Increase fluids, avoid NSAIDS, monitor sugars, will monitor         Type II diabetes mellitus with neurological manifestations (HCC) Monitor symptoms Check feet daily Take extra care with cooking, hot/cold related to decrease sensation   Type 2 diabetes mellitus with hyperlipidemia (HCC) -     Lipid panel -     rosuvastatin (CRESTOR) 5 MG tablet; Take 1 tablet (5 mg total) by mouth at bedtime.  Insulin dependent diabetes mellitus ([place]) Discussed general issues about diabetes pathophysiology and management., Educational material distributed., Suggested low cholesterol diet., Encouraged aerobic exercise., Discussed foot care., Reminded to get yearly retinal exam. Get eye exam Increase by 2 units, 24units in am and a 27 units at night. Pt request to restart onglyza 2.5 mg daily, pt has at home. Keep food and sugar log- given to the patient Close follow up 2 weeks to check for improvement Discussed S&S of Hypo/hyper glycemia Will recheck A1c in three months    Further disposition pending results of labs. Discussed med's effects and SE's.   Over 30 minutes of exam, counseling, chart review, and critical decision making was performed.   Future Appointments  Date Time Provider [place]  01/28/2018 10:00 AM Gardiner Barefoot, DPM [facility]  02/09/2018  9:45 AM Vicie Mutters, PA-C [facility] None  03/16/2018 10:30 AM Unk Pinto, MD [facility] None  09/21/2018 10:00 AM Unk Pinto, MD [facility] None  12/17/2018  9:30 AM Ward Givens, NP [facility] None  12/18/2018 10:30 AM Vicie Mutters, PA-C [facility] None    ------------------------------------------------------------------------------------------------------------------   HPI 84 y.o.female  presents for evaluation of blood glucose and food journal.  She lives in a independent facility where she is provded one meal a day.  She cooks the remainder of her meals.  Reports she is very cautious about the foods she chooses. Her blood glucose ranges from 180-280's with two outlier in 300's.  Her evening glucose ranges from 200's to 290's.  She denies any hypoglycemia and denies any hyperglycemia symptoms.  She is concerned her glucose is still too high.  She is taking 22units of 70/30 in am and 27 units at nighttime. She has a past medical history of Anemia, unspecified, Arthritis, Basal cell carcinoma, DM neuropathy, type II diabetes mellitus ([place]), Epilepsy ([place]), GERD (gastroesophageal reflux disease), Gout, High cholesterol, Hypertension, Hypothyroidism, Meniere's disease, Obesity (BMI 30-39.9), Other forms of epilepsy and recurrent seizures without mention of intractable epilepsy (11/04/2012), Pneumonia (~ 1943), Skin cancer, Type II or unspecified type diabetes mellitus without mention of complication, not stated as uncontrolled, UTI (urinary tract infection), and Vitamin D deficiency. She follows with nuerology for vertigo and seizures with last appointment on 12/15/17 and next in one year, 2020.  Past Medical History:  Diagnosis Date  . Anemia, unspecified   . Arthritis    "knees; right shoulder" (09/15/2013)  . Basal cell carcinoma    "right upper outer lip"  . DM neuropathy, type II diabetes mellitus ([place])   . Epilepsy ([place])   . GERD (gastroesophageal reflux disease)   . Gout   . High cholesterol   . Hypertension   . Hypothyroidism   . Meniere's disease   . Obesity (BMI 30-39.9)   . Other forms of epilepsy and recurrent seizures without mention of intractable epilepsy 11/04/2012  Non convulsive paroxysmal spells, responding to [facility], patient not driving.   . Pneumonia ~ 1943  . Skin cancer    "forehead; right hand"  . Type II or unspecified type diabetes mellitus without mention  of complication, not stated as uncontrolled   . UTI (urinary tract infection)   . Vitamin D deficiency      Allergies  Allergen Reactions  . Ppd [Tuberculin Purified Protein Derivative] Other (See Comments)    indurated    Current Outpatient Medications on File Prior to Visit  Medication Sig  . acetaminophen (TYLENOL) 500 MG tablet Take 1,000 mg by mouth every 6 (six) hours as needed for moderate pain.  Marland Kitchen aspirin 81 MG tablet Take 81 mg by mouth daily.  Marland Kitchen atenolol (TENORMIN) 50 MG tablet Take 1 tablet (50 mg total) by mouth daily.  . Biotin 5000 MCG TABS Take 5,000 Units by mouth 2 (two) times daily.   . Cholecalciferol (VITAMIN D3) 10000 units capsule Take 1 capsule (10,000 Units total) by mouth daily.  . ferrous sulfate dried (SLOW FE) 160 (50 FE) MG TBCR Take 160 mg by mouth 2 (two) times daily.   . fish oil-omega-3 fatty acids 1000 MG capsule Take 2 g by mouth at bedtime.   . Flaxseed, Linseed, (FLAX SEED OIL) 1000 MG CAPS Take 1 capsule by mouth 3 (three) times daily.  . furosemide (LASIX) 40 MG tablet TAKE 1 TABLET BY MOUTH EVERY DAY  . glucose blood (FREESTYLE INSULINX TEST) test strip Check glucose 3 x/day before meals  . Lancets (FREESTYLE) lancets Use to check glucose 3  x/day  . lansoprazole (PREVACID) 30 MG capsule TAKE 1 CAPSULE BY MOUTH EVERY DAY  . levETIRAcetam (KEPPRA) 500 MG tablet TAKE ONE TABLET BY MOUTH THREE TIMES DAILY AFTER MEALS. GENERIC EQUIVALENT FOR KEPPRA  . levothyroxine (SYNTHROID, LEVOTHROID) 100 MCG tablet Take 1 tablet (100 mcg total) by mouth daily.  Marland Kitchen loratadine (CLARITIN) 10 MG tablet Take 10 mg by mouth daily.  . magnesium oxide (MAG-OX) 400 MG tablet Take 400 mg by mouth 3 (three) times daily.   . meclizine (ANTIVERT) 25 MG tablet TAKE 1 TABLET BY MOUTH DAILY  . OVER THE COUNTER MEDICATION Patient injects Humulin 70/30 25 units in AM, 20 units PM daily.   . potassium chloride SA (K-DUR,KLOR-CON) 20 MEQ tablet Take 1 tablet (20 mEq total) by mouth  daily.  . vitamin B-12 (CYANOCOBALAMIN) 100 MCG tablet Take 50 mcg by mouth daily.  . vitamin C (ASCORBIC ACID) 500 MG tablet Take 500 mg by mouth daily.   No current facility-administered medications on file prior to visit.     ROS: Review of Systems  Constitutional: Negative for chills, diaphoresis, fever, malaise/fatigue and weight loss.  Cardiovascular: Negative for chest pain, palpitations, orthopnea, claudication, leg swelling and PND.  Gastrointestinal: Negative for abdominal pain, blood in stool, constipation, diarrhea, heartburn, melena, nausea and vomiting.  Skin: Negative for itching and rash.  Neurological: Positive for tingling. Negative for dizziness, tremors, sensory change, speech change, focal weakness, seizures, loss of consciousness, weakness and headaches.  Endo/Heme/Allergies: Negative for environmental allergies and polydipsia. Does not bruise/bleed easily.    Physical Exam:  BP 138/76   Pulse 68   Temp (!) 97.3 F (36.3 C)   Ht 5\' 2"  (1.575 m)   Wt 210 lb 12.8 oz (95.6 kg)   SpO2 97%   BMI 38.56 kg/m   General Appearance: Well nourished, in no apparent distress. Eyes: PERRLA, EOMs, conjunctiva no swelling or  erythema Respiratory: Respiratory effort normal, BS equal bilaterally without rales, rhonchi, wheezing or stridor.  Cardio: RRR with no MRGs. Brisk peripheral pulses without edema.  Abdomen: Soft, + BS.  Non tender, no guarding, rebound, hernias, masses. Musculoskeletal: Full ROM, 5/5 strength, normal gait.  Skin: Warm, dry without rashes, lesions, ecchymosis.  Neuro: Cranial nerves intact. Normal muscle tone, no cerebellar symptoms. Sensation decreased, mild, to hands and feet. No open areas. Psych: Awake and oriented X 3, normal affect, Insight and Judgment appropriate.     [name], NP 9:03 AM [facility]

## 2018-01-28 ENCOUNTER — Ambulatory Visit: Payer: Medicare Other | Admitting: Podiatry

## 2018-02-09 ENCOUNTER — Ambulatory Visit: Payer: Self-pay | Admitting: Physician Assistant

## 2018-02-15 NOTE — Progress Notes (Signed)
82 Week Follow Up:  Comprehensive care plan:  Patient/caregiver was given comprehensive care plan We will continue to monitor these goals every 3 months with an office visit and every month by a telephone call  Patient can contact the office any time with the phone number and get to a provider via the answering service or they can use Mychart.   Verbal permission was received from the patient to review comprehensive care management, they understand they have the right to stop CCM services at any time.   The patient is self managing medications at home.  Medications were reviewed with the patient today as well as adherence and potential interactions.   The patient does not need home health services at this time.   Essential hypertension - continue medications, DASH diet, exercise and monitor at home. Call if greater than 130/80.   CKD stage 4 due to type 2 diabetes mellitus ([place]) You can continue the onglyza 2.5 mg a day Suggest taking your night time insulin WITH your dinner  Do this for 2 weeks and if your morning sugar is still above 175, increase your insulin dose by 2 units morning and night You would go from 24 to 26 and 27 to 29 or 30 at night.   Type II diabetes mellitus with neurological manifestations ([place]) You can continue the onglyza 2.5 mg a day Suggest taking your night time insulin WITH your dinner  Do this for 2 weeks and if your morning sugar is still above 175, increase your insulin dose by 2 units morning and night You would go from 24 to 26 and 27 to 29 or 30 at night.   Type 2 diabetes mellitus with hyperlipidemia ([place]) You can continue the onglyza 2.5 mg a day Suggest taking your night time insulin WITH your dinner  Do this for 2 weeks and if your morning sugar is still above 175, increase your insulin dose by 2 units morning and night You would go from 24 to 26 and 27 to 29 or 30 at night.  Continue crestor, check next ov  Insulin dependent diabetes  mellitus ([place]) You can continue the onglyza 2.5 mg a day Suggest taking your night time insulin WITH your dinner  Do this for 2 weeks and if your morning sugar is still above 175, increase your insulin dose by 2 units morning and night You would go from 24 to 26 and 27 to 29 or 30 at night.   Hyperlipidemia Continue crestor  Morbid obesity (BMI 39.86)  (HCC) Try to add in more veggies for weight loss Keep the food diary and monitor what foods make your sugars go up  Suggest trying chair aerobics, getting a pool or can try tai chi This will help a lot with your joints, with your sugars and movement     Further disposition pending results of labs. Discussed med's effects and SE's.   Over 30 minutes of exam, counseling, chart review, and critical decision making was performed.   Future Appointments  Date Time Provider [place]  03/11/2018  3:30 PM Gardiner Barefoot, DPM [facility]  03/16/2018 10:30 AM Unk Pinto, MD [facility] None  09/21/2018 10:00 AM Unk Pinto, MD [facility] None  12/17/2018  9:30 AM Ward Givens, NP [facility] None  12/18/2018 10:30 AM Vicie Mutters, PA-C [facility] None    ------------------------------------------------------------------------------------------------------------------   HPI 84 y.o.female presents for evaluation of blood glucose and food journal.    She lives in a independent facility where she  is provded one meal a day.  She cooks the remainder of her meals.  Reports she is very cautious about the foods she chooses.   Her blood glucose ranges from 180-280's with two outlier in 300's.  Her evening glucose ranges from 200's to 290's.    She denies any hypoglycemia and denies any hyperglycemia symptoms.  She is concerned her glucose is still too high. Has eye exam scheduled for Monday.   Her insulin was increased by 2 units by 24units in am and a 27 units at night. Pt request to restart onglyza 2.5 mg daily, pt  has at home. Keep food and sugar log- given to the patient  She states she walks around the building some but her legs are weak and she walks with a walker first thing in the morning .Better later in the day.   Discussed S&S of Hypo/hyper glycemia Lab Results  Component Value Date   HGBA1C 9.0 (H) 12/08/2017     Past Medical History:  Diagnosis Date  . Anemia, unspecified   . Arthritis    "knees; right shoulder" (09/15/2013)  . Basal cell carcinoma    "right upper outer lip"  . DM neuropathy, type II diabetes mellitus ([place])   . Epilepsy ([place])   . GERD (gastroesophageal reflux disease)   . Gout   . High cholesterol   . Hypertension   . Hypothyroidism   . Meniere's disease   . Obesity (BMI 30-39.9)   . Other forms of epilepsy and recurrent seizures without mention of intractable epilepsy 11/04/2012   Non convulsive paroxysmal spells, responding to [facility], patient not driving.   . Pneumonia ~ 1943  . Skin cancer    "forehead; right hand"  . Type II or unspecified type diabetes mellitus without mention of complication, not stated as uncontrolled   . UTI (urinary tract infection)   . Vitamin D deficiency      Allergies  Allergen Reactions  . Ppd [Tuberculin Purified Protein Derivative] Other (See Comments)    indurated    Current Outpatient Medications on File Prior to Visit  Medication Sig  . acetaminophen (TYLENOL) 500 MG tablet Take 1,000 mg by mouth every 6 (six) hours as needed for moderate pain.  Marland Kitchen aspirin 81 MG tablet Take 81 mg by mouth daily.  Marland Kitchen atenolol (TENORMIN) 50 MG tablet Take 1 tablet (50 mg total) by mouth daily.  . Biotin 5000 MCG TABS Take 5,000 Units by mouth 2 (two) times daily.   . Cholecalciferol (VITAMIN D3) 10000 units capsule Take 1 capsule (10,000 Units total) by mouth daily.  . ferrous sulfate dried (SLOW FE) 160 (50 FE) MG TBCR Take 160 mg by mouth 2 (two) times daily.   . fish oil-omega-3 fatty acids 1000 MG capsule Take 2 g by mouth at  bedtime.   . Flaxseed, Linseed, (FLAX SEED OIL) 1000 MG CAPS Take 1 capsule by mouth 3 (three) times daily.  . furosemide (LASIX) 40 MG tablet TAKE 1 TABLET BY MOUTH EVERY DAY  . glucose blood (FREESTYLE INSULINX TEST) test strip Check glucose 3 x/day before meals  . Lancets (FREESTYLE) lancets Use to check glucose 3  x/day  . lansoprazole (PREVACID) 30 MG capsule TAKE 1 CAPSULE BY MOUTH EVERY DAY  . levETIRAcetam (KEPPRA) 500 MG tablet TAKE ONE TABLET BY MOUTH THREE TIMES DAILY AFTER MEALS. GENERIC EQUIVALENT FOR KEPPRA  . levothyroxine (SYNTHROID, LEVOTHROID) 100 MCG tablet Take 1 tablet (100 mcg total) by mouth daily.  Marland Kitchen loratadine (CLARITIN)  10 MG tablet Take 10 mg by mouth daily.  . magnesium oxide (MAG-OX) 400 MG tablet Take 400 mg by mouth 3 (three) times daily.   . meclizine (ANTIVERT) 25 MG tablet TAKE 1 TABLET BY MOUTH DAILY  . OVER THE COUNTER MEDICATION Patient injects Humulin 70/30 25 units in AM, 20 units PM daily.   . potassium chloride SA (K-DUR,KLOR-CON) 20 MEQ tablet Take 1 tablet (20 mEq total) by mouth daily.  . rosuvastatin (CRESTOR) 5 MG tablet Take 1 tablet (5 mg total) by mouth at bedtime.  . vitamin B-12 (CYANOCOBALAMIN) 100 MCG tablet Take 50 mcg by mouth daily.  . vitamin C (ASCORBIC ACID) 500 MG tablet Take 500 mg by mouth daily.   No current facility-administered medications on file prior to visit.     ROS: Review of Systems  Constitutional: Negative for chills, diaphoresis, fever, malaise/fatigue and weight loss.  Cardiovascular: Negative for chest pain, palpitations, orthopnea, claudication, leg swelling and PND.  Gastrointestinal: Negative for abdominal pain, blood in stool, constipation, diarrhea, heartburn, melena, nausea and vomiting.  Skin: Negative for itching and rash.  Neurological: Positive for tingling. Negative for dizziness, tremors, sensory change, speech change, focal weakness, seizures, loss of consciousness, weakness and headaches.   Endo/Heme/Allergies: Negative for environmental allergies and polydipsia. Does not bruise/bleed easily.    Physical Exam:  BP 130/72   General Appearance: Well nourished, in no apparent distress. Eyes: PERRLA, EOMs, conjunctiva no swelling or erythema Respiratory: Respiratory effort normal, BS equal bilaterally without rales, rhonchi, wheezing or stridor.  Cardio: RRR with no MRGs. Brisk peripheral pulses without edema.  Abdomen: Soft, + BS.  Non tender, no guarding, rebound, hernias, masses. Musculoskeletal: Full ROM, 5/5 strength, normal gait.  Skin: Warm, dry without rashes, lesions, ecchymosis.  Neuro: Cranial nerves intact. Normal muscle tone, no cerebellar symptoms. Sensation decreased, mild, to hands and feet. No open areas. Psych: Awake and oriented X 3, normal affect, Insight and Judgment appropriate.     Vicie Mutters, PA-C 2:52 PM [facility]

## 2018-02-16 ENCOUNTER — Ambulatory Visit: Payer: Medicare Other | Admitting: Physician Assistant

## 2018-02-16 VITALS — BP 130/72 | HR 84 | Temp 97.5°F | Wt 213.0 lb

## 2018-02-16 DIAGNOSIS — E1149 Type 2 diabetes mellitus with other diabetic neurological complication: Secondary | ICD-10-CM

## 2018-02-16 DIAGNOSIS — Z794 Long term (current) use of insulin: Secondary | ICD-10-CM

## 2018-02-16 DIAGNOSIS — E119 Type 2 diabetes mellitus without complications: Secondary | ICD-10-CM

## 2018-02-16 DIAGNOSIS — E1122 Type 2 diabetes mellitus with diabetic chronic kidney disease: Secondary | ICD-10-CM

## 2018-02-16 DIAGNOSIS — E1169 Type 2 diabetes mellitus with other specified complication: Secondary | ICD-10-CM

## 2018-02-16 DIAGNOSIS — E785 Hyperlipidemia, unspecified: Secondary | ICD-10-CM

## 2018-02-16 DIAGNOSIS — I10 Essential (primary) hypertension: Secondary | ICD-10-CM

## 2018-02-16 DIAGNOSIS — N184 Chronic kidney disease, stage 4 (severe): Secondary | ICD-10-CM

## 2018-02-16 DIAGNOSIS — IMO0001 Reserved for inherently not codable concepts without codable children: Secondary | ICD-10-CM

## 2018-02-16 DIAGNOSIS — E782 Mixed hyperlipidemia: Secondary | ICD-10-CM

## 2018-02-16 NOTE — Patient Instructions (Addendum)
Chronic Care Management  This is your comprehensive care plan.  We will continue to monitor these chronic health issues and see if we are achieving our goals every 3 months or more.  You can contact the office any time with the office phone number and get to a provider via the answering service or you can use Mychart, if you are not signed up they can do so at the front office.  Here is some information that we discussed and went over below.   You can continue the onglyza 2.5 mg a day Suggest taking your night time insulin WITH your dinner  Do this for 2 weeks and if your morning sugar is still above 175, increase your insulin dose by 2 units morning and night You would go from 24 to 26 and 27 to 29 or 30 at night.   Try to add in more veggies for weight loss Keep the food diary and monitor what foods make your sugars go up  Suggest trying chair aerobics, getting a pool or can try tai chi This will help a lot with your joints, with your sugars and movement   These are the goals we discussed: Goals (1 Years of Data) as of 02/16/2018 at 3:10 PM            Today   01/12/18   12/15/17   12/08/17   08/25/17     Result Component   . HEMOGLOBIN A1C < 8.0         9.0  8.2    Note created  02/16/2018  3:10 PM by Vicie Mutters, PA-C    You can continue the onglyza 2.5 mg a day Suggest taking your night time insulin WITH your dinner  Do this for 2 weeks and if your morning sugar is still above 175, increase your insulin dose by 2 units morning and night You would go from 24 to 26 and 27 to 29 or 30 at night.     . LDL CALC < 70                Weight   . Weight (lb) < 200 lb (90.7 kg)   213 lb (96.6 kg)  210 lb 12.8 oz (95.6 kg)  208 lb 9.6 oz (94.6 kg)  207 lb 12.8 oz (94.3 kg)  199 lb 6.4 oz (90.4 kg)    Note created  02/16/2018  3:09 PM by Vicie Mutters, PA-C    Try to add in more veggies for weight loss Keep the food diary and monitor what foods make your sugars go  up  Suggest trying chair aerobics, getting a pool or can try tai chi This will help a lot with your joints, with your sugars and movement       Problem list Patient Active Problem List   Diagnosis Date Noted  . Type 2 diabetes mellitus with hyperlipidemia ([place]) 12/08/2017  . Insulin dependent diabetes mellitus ([place]) 12/08/2017  . ACE inhibitor intolerance 12/08/2017  . Gluttony 07/22/2016  . Vertigo of central origin 11/20/2015  . Morbid obesity (BMI 39.86)  ([place]) 12/12/2014  . OSA on CPAP 08/08/2014  . Type II diabetes mellitus with neurological manifestations ([place]) 09/15/2013  . Hyperlipidemia 06/27/2013  . Vitamin D deficiency 06/27/2013  . Seizure disorder ([place]) 11/04/2012  . Iron deficiency anemia 06/13/2008  . COLONIC POLYPS 06/09/2008  . Hypothyroidism 06/09/2008  . CKD stage 4 due to type 2 diabetes mellitus ([place]) 06/09/2008  . Venous (peripheral) insufficiency  06/09/2008  . GERD w/Hx of Esophageal Stricture 06/09/2008  . Essential hypertension 06/09/2008    Medication List Current Outpatient Medications on File Prior to Visit  Medication Sig  . acetaminophen (TYLENOL) 500 MG tablet Take 1,000 mg by mouth every 6 (six) hours as needed for moderate pain.  Marland Kitchen aspirin 81 MG tablet Take 81 mg by mouth daily.  Marland Kitchen atenolol (TENORMIN) 50 MG tablet Take 1 tablet (50 mg total) by mouth daily.  . Biotin 5000 MCG TABS Take 5,000 Units by mouth 2 (two) times daily.   . Cholecalciferol (VITAMIN D3) 10000 units capsule Take 1 capsule (10,000 Units total) by mouth daily.  . ferrous sulfate dried (SLOW FE) 160 (50 FE) MG TBCR Take 160 mg by mouth 2 (two) times daily.   . fish oil-omega-3 fatty acids 1000 MG capsule Take 2 g by mouth at bedtime.   . Flaxseed, Linseed, (FLAX SEED OIL) 1000 MG CAPS Take 1 capsule by mouth 3 (three) times daily.  . furosemide (LASIX) 40 MG tablet TAKE 1 TABLET BY MOUTH EVERY DAY  . glucose blood (FREESTYLE INSULINX TEST) test strip Check glucose 3 x/day  before meals  . Lancets (FREESTYLE) lancets Use to check glucose 3  x/day  . lansoprazole (PREVACID) 30 MG capsule TAKE 1 CAPSULE BY MOUTH EVERY DAY  . levETIRAcetam (KEPPRA) 500 MG tablet TAKE ONE TABLET BY MOUTH THREE TIMES DAILY AFTER MEALS. GENERIC EQUIVALENT FOR KEPPRA  . levothyroxine (SYNTHROID, LEVOTHROID) 100 MCG tablet Take 1 tablet (100 mcg total) by mouth daily.  Marland Kitchen loratadine (CLARITIN) 10 MG tablet Take 10 mg by mouth daily.  . magnesium oxide (MAG-OX) 400 MG tablet Take 400 mg by mouth 3 (three) times daily.   . meclizine (ANTIVERT) 25 MG tablet TAKE 1 TABLET BY MOUTH DAILY  . OVER THE COUNTER MEDICATION Patient injects Humulin 70/30 25 units in AM, 20 units PM daily.   . potassium chloride SA (K-DUR,KLOR-CON) 20 MEQ tablet Take 1 tablet (20 mEq total) by mouth daily.  . rosuvastatin (CRESTOR) 5 MG tablet Take 1 tablet (5 mg total) by mouth at bedtime.  . vitamin B-12 (CYANOCOBALAMIN) 100 MCG tablet Take 50 mcg by mouth daily.  . vitamin C (ASCORBIC ACID) 500 MG tablet Take 500 mg by mouth daily.   No current facility-administered medications on file prior to visit.     Allergies Allergies  Allergen Reactions  . Ppd [Tuberculin Purified Protein Derivative] Other (See Comments)    indurated    Patient Care Team Patient Care Team: Unk Pinto, MD as PCP - General (Internal Medicine) Rozetta Nunnery, MD as Consulting Physician (Otolaryngology) Teena Irani, MD (Inactive) as Consulting Physician (Gastroenterology) Marcy Panning, MD as Consulting Physician (Oncology)   This is a list of the screening recommended for you and due dates:  Health Maintenance  Topic Date Due  . Eye exam for diabetics  01/29/2018  . Hemoglobin A1C  06/09/2018  . Complete foot exam   08/25/2018  . Urine Protein Check  08/26/2018  . Tetanus Vaccine  12/09/2027  . Flu Shot  Completed  . DEXA scan (bone density measurement)  Completed  . Pneumonia vaccines  Completed     These  are your follow up appointments: Future Appointments  Date Time Provider [place]  03/11/2018  3:30 PM Gardiner Barefoot, DPM [facility]  03/16/2018 10:30 AM Unk Pinto, MD [facility] None  09/21/2018 10:00 AM Unk Pinto, MD [facility] None  12/17/2018  9:30 AM Ward Givens, NP [facility]  None  12/18/2018 10:30 AM Vicie Mutters, PA-C [facility] None

## 2018-02-23 LAB — HM DIABETES EYE EXAM

## 2018-03-03 ENCOUNTER — Encounter: Payer: Self-pay | Admitting: *Deleted

## 2018-03-05 ENCOUNTER — Other Ambulatory Visit: Payer: Self-pay | Admitting: Nephrology

## 2018-03-05 DIAGNOSIS — N184 Chronic kidney disease, stage 4 (severe): Secondary | ICD-10-CM

## 2018-03-09 ENCOUNTER — Ambulatory Visit
Admission: RE | Admit: 2018-03-09 | Discharge: 2018-03-09 | Disposition: A | Discharge status: Home / Self Care | Payer: Medicare Other | Source: Ambulatory Visit | Attending: Nephrology | Admitting: Nephrology

## 2018-03-09 ENCOUNTER — Other Ambulatory Visit: Payer: Self-pay | Admitting: Internal Medicine

## 2018-03-09 DIAGNOSIS — G40909 Epilepsy, unspecified, not intractable, without status epilepticus: Secondary | ICD-10-CM

## 2018-03-09 DIAGNOSIS — N184 Chronic kidney disease, stage 4 (severe): Secondary | ICD-10-CM

## 2018-03-09 MED ORDER — LEVETIRACETAM 500 MG PO TABS: ORAL_TABLET | ORAL | 3 refills | Status: DC

## 2018-03-11 ENCOUNTER — Ambulatory Visit (INDEPENDENT_AMBULATORY_CARE_PROVIDER_SITE_OTHER): Payer: Medicare Other | Admitting: Podiatry

## 2018-03-11 ENCOUNTER — Encounter: Payer: Self-pay | Admitting: Podiatry

## 2018-03-11 DIAGNOSIS — M79674 Pain in right toe(s): Secondary | ICD-10-CM | POA: Diagnosis not present

## 2018-03-11 DIAGNOSIS — E1140 Type 2 diabetes mellitus with diabetic neuropathy, unspecified: Secondary | ICD-10-CM

## 2018-03-11 DIAGNOSIS — B351 Tinea unguium: Secondary | ICD-10-CM

## 2018-03-11 DIAGNOSIS — E1149 Type 2 diabetes mellitus with other diabetic neurological complication: Secondary | ICD-10-CM

## 2018-03-11 DIAGNOSIS — M2011 Hallux valgus (acquired), right foot: Secondary | ICD-10-CM

## 2018-03-11 DIAGNOSIS — M2012 Hallux valgus (acquired), left foot: Secondary | ICD-10-CM

## 2018-03-11 NOTE — Progress Notes (Signed)
This patient returns to the office for preventative foot care services. She says she is not having any pain or discomfort. She says her nails have grown thick and long.  Her nails are painful walking and wearing her shoes. She presents the office today for continued evaluation and treatment .     GENERAL APPEARANCE: Alert, conversant. Appropriately groomed. No acute distress.  VASCULAR: Pedal pulses are  palpable at  [facility] and PT bilateral.  Capillary refill time is immediate to all digits,  Normal temperature gradient.   NEUROLOGIC: sensation is diminished  to 5.07 monofilament at 5/5 sites bilateral.  Light touch is intact bilateral, Muscle strength normal.  MUSCULOSKELETAL: acceptable muscle strength, tone and stability bilateral.  Intrinsic muscluature intact bilateral.  HAV  B/L.  DERMATOLOGIC: skin color, texture, and turgor are within normal limits.  No preulcerative lesions or ulcers  are seen, no interdigital maceration noted.  No open lesions present.  ... No drainage noted or infection noted.  Nails  Thick disfigured discolored nail right hallux.  Absent hallux toenail left foot.     Onychomycosis  Right hallux  TX  Debridement of long thick nails.  RTC 3 months. Patient qualifies for diabetic shoes for DPN and HAV right and HAV left. Patient to be seen by Liliane Channel for diabetic shoes.   Gardiner Barefoot DPM

## 2018-03-15 ENCOUNTER — Encounter: Payer: Self-pay | Admitting: Internal Medicine

## 2018-03-15 NOTE — Patient Instructions (Signed)

## 2018-03-15 NOTE — Progress Notes (Signed)
This very nice 85 y.o. WWF presents for 6 month follow up with HTN, HLD, Ins req T2_DM/CKD4, Seizure disorder and Vitamin D Deficiency. Patient has been evaluated in the past by Dr Brett Fairy for Seizure Disorder. Patient also is on CPAP for OSA. Patient also presents for Diabetic foot exam.     Patient is treated for HTN (1999) & BP has been controlled at home. Today's BP is at goal - 124/72. Patient has had no complaints of any cardiac type chest pain, palpitations, dyspnea / orthopnea / PND, dizziness, claudication, or dependent edema.     Hyperlipidemia is controlled with diet & meds. Patient denies myalgias or other med SE's. Last Lipids were at goal except elevated Trig's: Lab Results  Component Value Date   CHOL 157 12/08/2017   HDL 28 (L) 12/08/2017   LDLCALC 92 12/08/2017   TRIG 285 (H) 12/08/2017   CHOLHDL 5.6 (H) 12/08/2017      Also, the patient has Morbid Obesity (BMI 36+) and history of Insulin Requiring  T2_DM since 2001 and also CKD4. She denies  symptoms of reactive hypoglycemia, diabetic polys, paresthesias or visual blurring.  Dietary compliance is poor & last A1c was not at goal: Lab Results  Component Value Date   HGBA1C 9.0 (H) 12/08/2017   Wt Readings from Last 3 Encounters:  03/16/18 211 lb (95.7 kg)  02/16/18 213 lb (96.6 kg)  01/12/18 210 lb 12.8 oz (95.6 kg)      Patient was initiated in the 1990's on Thyroid Replacement.     Further, the patient also has history of Vitamin D Deficiency ("37" / 2008) and supplements vitamin D without any suspected side-effects. Last vitamin D was slightly elevated & dose was d/c'd: Lab Results  Component Value Date   VD25OH 108 (H) 08/25/2017   Current Outpatient Medications on File Prior to Visit  Medication Sig  . acetaminophen (TYLENOL) 500 MG tablet Take 1,000 mg by mouth every 6 (six) hours as needed for moderate pain.  Marland Kitchen aspirin 81 MG tablet Take 81 mg by mouth daily.  Marland Kitchen atenolol (TENORMIN) 50 MG tablet Take 1  tablet (50 mg total) by mouth daily.  . Biotin 5000 MCG TABS Take 5,000 Units by mouth 2 (two) times daily.   . Cholecalciferol (VITAMIN D3) 10000 units capsule Take 1 capsule (10,000 Units total) by mouth daily.  . ferrous sulfate dried (SLOW FE) 160 (50 FE) MG TBCR Take 160 mg by mouth 2 (two) times daily.   . fish oil-omega-3 fatty acids 1000 MG capsule Take 2 g by mouth at bedtime.   . Flaxseed, Linseed, (FLAX SEED OIL) 1000 MG CAPS Take 1 capsule by mouth 3 (three) times daily.  . furosemide (LASIX) 40 MG tablet TAKE 1 TABLET BY MOUTH EVERY DAY  . glucose blood (FREESTYLE INSULINX TEST) test strip Check glucose 3 x/day before meals  . Lancets (FREESTYLE) lancets Use to check glucose 3  x/day  . lansoprazole (PREVACID) 30 MG capsule TAKE 1 CAPSULE BY MOUTH EVERY DAY  . levETIRAcetam (KEPPRA) 500 MG tablet Take 1 tablet 3 x /day after meals to prevent seizures  . levothyroxine (SYNTHROID, LEVOTHROID) 100 MCG tablet Take 1 tablet (100 mcg total) by mouth daily.  Marland Kitchen loratadine (CLARITIN) 10 MG tablet Take 10 mg by mouth daily.  . magnesium oxide (MAG-OX) 400 MG tablet Take 400 mg by mouth 3 (three) times daily.   . meclizine (ANTIVERT) 25 MG tablet TAKE 1 TABLET BY MOUTH DAILY  .  OVER THE COUNTER MEDICATION Patient injects Humulin 70/30 25 units in AM, 20 units PM daily.   . potassium chloride SA (K-DUR,KLOR-CON) 20 MEQ tablet Take 1 tablet (20 mEq total) by mouth daily.  . rosuvastatin (CRESTOR) 5 MG tablet Take 1 tablet (5 mg total) by mouth at bedtime.  . saxagliptin HCl (ONGLYZA) 5 MG TABS tablet Take by mouth daily.  . vitamin B-12 (CYANOCOBALAMIN) 100 MCG tablet Take 50 mcg by mouth daily.  . vitamin C (ASCORBIC ACID) 500 MG tablet Take 500 mg by mouth daily.   No current facility-administered medications on file prior to visit.    Allergies  Allergen Reactions  . Ppd [Tuberculin Purified Protein Derivative] Other (See Comments)    indurated   PMHx:   Past Medical History:    Diagnosis Date  . Anemia, unspecified   . Arthritis    "knees; right shoulder" (09/15/2013)  . Basal cell carcinoma    "right upper outer lip"  . DM neuropathy, type II diabetes mellitus ([place])   . Epilepsy ([place])   . GERD (gastroesophageal reflux disease)   . Gout   . High cholesterol   . Hypertension   . Hypothyroidism   . Meniere's disease   . Obesity (BMI 30-39.9)   . Other forms of epilepsy and recurrent seizures without mention of intractable epilepsy 11/04/2012   Non convulsive paroxysmal spells, responding to [facility], patient not driving.   . Pneumonia ~ 1943  . Skin cancer    "forehead; right hand"  . Type II or unspecified type diabetes mellitus without mention of complication, not stated as uncontrolled   . UTI (urinary tract infection)   . Vitamin D deficiency    Immunization History  Administered Date(s) Administered  . DTaP 03/18/2007  . Influenza, High Dose Seasonal PF 10/18/2013, 12/12/2014, 10/24/2015, 10/28/2016, 12/08/2017  . Influenza-Unspecified 11/18/2012  . Pneumococcal Conjugate-13 12/12/2014  . Pneumococcal Polysaccharide-23 09/16/2013  . Td 12/08/2017  . Zoster 02/28/2005   Past Surgical History:  Procedure Laterality Date  . CERVICAL POLYPECTOMY    . HAMMER TOE SURGERY Left 1980's  . MOHS SURGERY  20087   "right upper outer lip"  . TONSILLECTOMY  ~ 1947  . WRIST SURGERY Left ~ 1950   "ran arm thru window"   FHx:    Reviewed / unchanged  SHx:    Reviewed / unchanged   Systems Review:  Constitutional: Denies fever, chills, wt changes, headaches, insomnia, fatigue, night sweats, change in appetite. Eyes: Denies redness, blurred vision, diplopia, discharge, itchy, watery eyes.  ENT: Denies discharge, congestion, post nasal drip, epistaxis, sore throat, earache, hearing loss, dental pain, tinnitus, vertigo, sinus pain, snoring.  CV: Denies chest pain, palpitations, irregular heartbeat, syncope, dyspnea, diaphoresis, orthopnea, PND, claudication  or edema. Respiratory: denies cough, dyspnea, DOE, pleurisy, hoarseness, laryngitis, wheezing.  Gastrointestinal: Denies dysphagia, odynophagia, heartburn, reflux, water brash, abdominal pain or cramps, nausea, vomiting, bloating, diarrhea, constipation, hematemesis, melena, hematochezia  or hemorrhoids. Genitourinary: Denies dysuria, frequency, urgency, nocturia, hesitancy, discharge, hematuria or flank pain. Musculoskeletal: Denies arthralgias, myalgias, stiffness, jt. swelling, pain, limping or strain/sprain.  Skin: Denies pruritus, rash, hives, warts, acne, eczema or change in skin lesion(s). Neuro: No weakness, tremor, incoordination, spasms, paresthesia or pain. Psychiatric: Denies confusion, memory loss or sensory loss. Endo: Denies change in weight, skin or hair change.  Heme/Lymph: No excessive bleeding, bruising or enlarged lymph nodes.  Physical Exam  BP 124/72   Pulse 68   Temp (!) 97.2 F (36.2 C)   Ht  5\' 2"  (1.575 m)   Wt 211 lb (95.7 kg)   SpO2 99%   BMI 38.59 kg/m   Appears  well nourished, well groomed  and in no distress.  Eyes: PERRLA, EOMs, conjunctiva no swelling or erythema. Sinuses: No frontal/maxillary tenderness ENT/Mouth: EAC's clear, TM's nl w/o erythema, bulging. Nares clear w/o erythema, swelling, exudates. Oropharynx clear without erythema or exudates. Oral hygiene is good. Tongue normal, non obstructing. Hearing intact.  Neck: Supple. Thyroid not palpable. Car 2+/2+ without bruits, nodes or JVD. Chest: Respirations nl with BS clear & equal w/o rales, rhonchi, wheezing or stridor.  Cor: Heart sounds normal w/ regular rate and rhythm without sig. murmurs, gallops, clicks or rubs. Peripheral pulses normal and equal  without edema.  Abdomen: Soft, rotund & bowel sounds normal. Non-tender w/o guarding, rebound, hernias, masses or organomegaly.  Lymphatics: Unremarkable.  Musculoskeletal: Full ROM all peripheral extremities, joint stability, 5/5 strength and  normal gait. Patient is noted to have Hallux valgus of the Rt > Lt with mild hammer toe deformities of the toes 2-5 bilat.  Skin: Warm, dry without exposed rashes, lesions or ecchymosis apparent.  Neuro: Cranial nerves intact, reflexes equal bilaterally. Sensation  to touch, vibratory and Monofilament from ankle level to the toes bilaterally. Motor testing grossly intact. Tendon reflexes flat thru-out.  Pysch: Alert & oriented x 3.  Insight and judgement nl & appropriate. No ideations.  Assessment and Plan:  1. Essential hypertension   - Continue medication, monitor blood pressure at home.  - Continue DASH diet.  Reminder to go to the ER if any CP,  SOB, nausea, dizziness, severe HA, changes vision/speech.  - CBC with Differential/Platelet - COMPLETE METABOLIC PANEL WITH GFR - Magnesium - TSH  2. Hyperlipidemia, mixed  - Continue diet/meds, exercise,& lifestyle modifications.  - Continue monitor periodic cholesterol/liver & renal functions   - Lipid panel - TSH  3. Type 2 diabetes mellitus with stage 4 chronic kidney disease, with long-term current use of insulin (HCC)  - Hemoglobin A1c  4. Vitamin D deficiency  - Continue diet, exercise  - Lifestyle modifications.  - Monitor appropriate labs. - Continue supplementation.  - VITAMIN D 25 Hydroxyl  5. Seizure disorder (HCC)  - Levetiracetam level  6. Hypothyroidism  - TSH  7. OSA on CPAP  8. Medication management  - CBC with Differential/Platelet - COMPLETE METABOLIC PANEL WITH GFR - Magnesium - Lipid panel - TSH - Hemoglobin A1c - VITAMIN D 25 Hydroxyl - Levetiracetam level       Discussed  regular exercise, BP monitoring, weight control to achieve/maintain BMI less than 25 and discussed med and SE's. Recommended labs to assess and monitor clinical status with further disposition pending results of labs. Over 30 minutes of exam, counseling, chart review was performed.

## 2018-03-16 ENCOUNTER — Ambulatory Visit (INDEPENDENT_AMBULATORY_CARE_PROVIDER_SITE_OTHER): Payer: Medicare Other | Admitting: Internal Medicine

## 2018-03-16 ENCOUNTER — Encounter: Payer: Self-pay | Admitting: Internal Medicine

## 2018-03-16 VITALS — BP 124/72 | HR 68 | Temp 97.2°F | Ht 62.0 in | Wt 211.0 lb

## 2018-03-16 DIAGNOSIS — E1122 Type 2 diabetes mellitus with diabetic chronic kidney disease: Secondary | ICD-10-CM | POA: Diagnosis not present

## 2018-03-16 DIAGNOSIS — G4733 Obstructive sleep apnea (adult) (pediatric): Secondary | ICD-10-CM

## 2018-03-16 DIAGNOSIS — I10 Essential (primary) hypertension: Secondary | ICD-10-CM

## 2018-03-16 DIAGNOSIS — B351 Tinea unguium: Secondary | ICD-10-CM

## 2018-03-16 DIAGNOSIS — N184 Chronic kidney disease, stage 4 (severe): Secondary | ICD-10-CM

## 2018-03-16 DIAGNOSIS — Z9989 Dependence on other enabling machines and devices: Secondary | ICD-10-CM

## 2018-03-16 DIAGNOSIS — E039 Hypothyroidism, unspecified: Secondary | ICD-10-CM

## 2018-03-16 DIAGNOSIS — E782 Mixed hyperlipidemia: Secondary | ICD-10-CM | POA: Diagnosis not present

## 2018-03-16 DIAGNOSIS — Z79899 Other long term (current) drug therapy: Secondary | ICD-10-CM

## 2018-03-16 DIAGNOSIS — Z794 Long term (current) use of insulin: Secondary | ICD-10-CM

## 2018-03-16 DIAGNOSIS — E559 Vitamin D deficiency, unspecified: Secondary | ICD-10-CM

## 2018-03-16 DIAGNOSIS — G40909 Epilepsy, unspecified, not intractable, without status epilepticus: Secondary | ICD-10-CM

## 2018-03-16 MED ORDER — INSULIN NPH ISOPHANE & REGULAR (70-30) 100 UNIT/ML ~~LOC~~ SUSP: SUBCUTANEOUS | 3 refills | Status: DC

## 2018-03-16 MED ORDER — TERBINAFINE HCL 250 MG PO TABS: ORAL_TABLET | ORAL | 1 refills | Status: DC

## 2018-03-18 LAB — COMPLETE METABOLIC PANEL WITH GFR
AG Ratio: 1.7 (calc) (ref 1.0–2.5)
ALT: 19 U/L (ref 6–29)
AST: 16 U/L (ref 10–35)
Albumin: 4.5 g/dL (ref 3.6–5.1)
Alkaline phosphatase (APISO): 51 U/L (ref 33–130)
BUN/Creatinine Ratio: 18 (calc) (ref 6–22)
BUN: 33 mg/dL — ABNORMAL HIGH (ref 7–25)
CO2: 26 mmol/L (ref 20–32)
Calcium: 10.9 mg/dL — ABNORMAL HIGH (ref 8.6–10.4)
Chloride: 105 mmol/L (ref 98–110)
Creat: 1.87 mg/dL — ABNORMAL HIGH (ref 0.60–0.88)
GFR, Est African American: 28 mL/min/{1.73_m2} — ABNORMAL LOW (ref >=60)
GFR, Est Non African American: 24 mL/min/{1.73_m2} — ABNORMAL LOW (ref >=60)
Globulin: 2.6 g/dL (calc) (ref 1.9–3.7)
Glucose, Bld: 208 mg/dL — ABNORMAL HIGH (ref 65–99)
Potassium: 4.9 mmol/L (ref 3.5–5.3)
Sodium: 140 mmol/L (ref 135–146)
Total Bilirubin: 0.4 mg/dL (ref 0.2–1.2)
Total Protein: 7.1 g/dL (ref 6.1–8.1)

## 2018-03-18 LAB — CBC WITH DIFFERENTIAL/PLATELET
Absolute Monocytes: 504 cells/uL (ref 200–950)
Basophils Absolute: 44 cells/uL (ref 0–200)
Basophils Relative: 0.6 %
Eosinophils Absolute: 635 cells/uL — ABNORMAL HIGH (ref 15–500)
Eosinophils Relative: 8.7 %
HCT: 37.1 % (ref 35.0–45.0)
Hemoglobin: 12.2 g/dL (ref 11.7–15.5)
Lymphs Abs: 1153 cells/uL (ref 850–3900)
MCH: 29.4 pg (ref 27.0–33.0)
MCHC: 32.9 g/dL (ref 32.0–36.0)
MCV: 89.4 fL (ref 80.0–100.0)
MPV: 10.7 fL (ref 7.5–12.5)
Monocytes Relative: 6.9 %
Neutro Abs: 4964 cells/uL (ref 1500–7800)
Neutrophils Relative %: 68 %
Platelets: 297 10*3/uL (ref 140–400)
RBC: 4.15 10*6/uL (ref 3.80–5.10)
RDW: 15.9 % — ABNORMAL HIGH (ref 11.0–15.0)
Total Lymphocyte: 15.8 %
WBC: 7.3 10*3/uL (ref 3.8–10.8)

## 2018-03-18 LAB — LIPID PANEL
Cholesterol: 105 mg/dL (ref <200)
HDL: 26 mg/dL — ABNORMAL LOW (ref >50)
LDL Cholesterol (Calc): 52 mg/dL (calc)
Non-HDL Cholesterol (Calc): 79 mg/dL (calc) (ref <130)
Total CHOL/HDL Ratio: 4 (calc) (ref <5.0)
Triglycerides: 197 mg/dL — ABNORMAL HIGH (ref <150)

## 2018-03-18 LAB — HEMOGLOBIN A1C
Hgb A1c MFr Bld: 8.4 % of total Hgb — ABNORMAL HIGH (ref <5.7)
Mean Plasma Glucose: 194 (calc)
eAG (mmol/L): 10.8 (calc)

## 2018-03-18 LAB — MAGNESIUM: Magnesium: 2.3 mg/dL (ref 1.5–2.5)

## 2018-03-18 LAB — VITAMIN D 25 HYDROXY (VIT D DEFICIENCY, FRACTURES): Vit D, 25-Hydroxy: 88 ng/mL (ref 30–100)

## 2018-03-18 LAB — TSH: TSH: 0.62 mIU/L (ref 0.40–4.50)

## 2018-03-18 LAB — LEVETIRACETAM LEVEL: Keppra (Levetiracetam): 44.5 ug/mL (ref 12.0–46.0)

## 2018-03-26 ENCOUNTER — Other Ambulatory Visit: Payer: Self-pay | Admitting: Physician Assistant

## 2018-04-07 ENCOUNTER — Ambulatory Visit (INDEPENDENT_AMBULATORY_CARE_PROVIDER_SITE_OTHER): Payer: Medicare Other | Admitting: Orthotics

## 2018-04-07 DIAGNOSIS — M2012 Hallux valgus (acquired), left foot: Secondary | ICD-10-CM

## 2018-04-07 DIAGNOSIS — E1140 Type 2 diabetes mellitus with diabetic neuropathy, unspecified: Secondary | ICD-10-CM | POA: Diagnosis not present

## 2018-04-07 DIAGNOSIS — E1149 Type 2 diabetes mellitus with other diabetic neurological complication: Secondary | ICD-10-CM | POA: Diagnosis not present

## 2018-04-07 DIAGNOSIS — M779 Enthesopathy, unspecified: Secondary | ICD-10-CM | POA: Diagnosis not present

## 2018-04-07 DIAGNOSIS — L98492 Non-pressure chronic ulcer of skin of other sites with fat layer exposed: Secondary | ICD-10-CM | POA: Diagnosis not present

## 2018-04-07 DIAGNOSIS — M2011 Hallux valgus (acquired), right foot: Secondary | ICD-10-CM | POA: Diagnosis not present

## 2018-04-07 DIAGNOSIS — M778 Other enthesopathies, not elsewhere classified: Secondary | ICD-10-CM

## 2018-04-07 NOTE — Progress Notes (Signed)

## 2018-05-05 ENCOUNTER — Other Ambulatory Visit: Payer: Self-pay | Admitting: Internal Medicine

## 2018-05-19 ENCOUNTER — Ambulatory Visit (INDEPENDENT_AMBULATORY_CARE_PROVIDER_SITE_OTHER): Payer: Medicare Other | Admitting: Adult Health

## 2018-05-19 DIAGNOSIS — Z794 Long term (current) use of insulin: Secondary | ICD-10-CM

## 2018-05-19 DIAGNOSIS — IMO0001 Reserved for inherently not codable concepts without codable children: Secondary | ICD-10-CM

## 2018-05-19 DIAGNOSIS — E782 Mixed hyperlipidemia: Secondary | ICD-10-CM | POA: Diagnosis not present

## 2018-05-19 DIAGNOSIS — E119 Type 2 diabetes mellitus without complications: Secondary | ICD-10-CM | POA: Diagnosis not present

## 2018-05-19 DIAGNOSIS — Z789 Other specified health status: Secondary | ICD-10-CM

## 2018-05-19 DIAGNOSIS — I10 Essential (primary) hypertension: Secondary | ICD-10-CM | POA: Diagnosis not present

## 2018-05-19 NOTE — Progress Notes (Signed)
CCM telephone visit  Medications reviewed/reconciled: yes  Current Outpatient Medications (Endocrine & Metabolic):  .  insulin NPH-regular Human (NOVOLIN 70/30) (70-30) 100 UNIT/ML injection, Takes 28 units qam and 26 units qpm .  levothyroxine (SYNTHROID, LEVOTHROID) 100 MCG tablet, Take 1 tablet (100 mcg total) by mouth daily. .  saxagliptin HCl (ONGLYZA) 5 MG TABS tablet, Take by mouth daily.  Current Outpatient Medications (Cardiovascular):  .  atenolol (TENORMIN) 50 MG tablet, Take 1 tablet (50 mg total) by mouth daily. .  furosemide (LASIX) 40 MG tablet, TAKE 1 TABLET BY MOUTH EVERY DAY .  rosuvastatin (CRESTOR) 5 MG tablet, Take 1 tablet (5 mg total) by mouth at bedtime.  Current Outpatient Medications (Respiratory):  .  loratadine (CLARITIN) 10 MG tablet, Take 10 mg by mouth daily.  Current Outpatient Medications (Analgesics):  .  acetaminophen (TYLENOL) 500 MG tablet, Take 1,000 mg by mouth every 6 (six) hours as needed for moderate pain. Marland Kitchen  aspirin 81 MG tablet, Take 81 mg by mouth daily.  Current Outpatient Medications (Hematological):  .  ferrous sulfate dried (SLOW FE) 160 (50 FE) MG TBCR, Take 160 mg by mouth 2 (two) times daily.  .  vitamin B-12 (CYANOCOBALAMIN) 100 MCG tablet, Take 50 mcg by mouth daily.  Current Outpatient Medications (Other):  .  Biotin 5000 MCG TABS, Take 5,000 Units by mouth 2 (two) times daily.  .  fish oil-omega-3 fatty acids 1000 MG capsule, Take 2 g by mouth at bedtime.  .  Flaxseed, Linseed, (FLAX SEED OIL) 1000 MG CAPS, Take 1 capsule by mouth 3 (three) times daily. Marland Kitchen  glucose blood (FREESTYLE INSULINX TEST) test strip, Check glucose 3 x/day before meals .  Lancets (FREESTYLE) lancets, Use to check glucose 3  x/day .  lansoprazole (PREVACID) 30 MG capsule, TAKE 1 CAPSULE BY MOUTH EVERY DAY .  levETIRAcetam (KEPPRA) 500 MG tablet, Take 1 tablet 3 x /day after meals to prevent seizures .  magnesium oxide (MAG-OX) 400 MG tablet, Take 400 mg by  mouth 3 (three) times daily.  .  meclizine (ANTIVERT) 25 MG tablet, TAKE 1 TABLET BY MOUTH DAILY .  OVER THE COUNTER MEDICATION, Patient injects Humulin 70/30 25 units in AM, 20 units PM daily.  .  potassium chloride SA (K-DUR,KLOR-CON) 20 MEQ tablet, Take 1 tablet (20 mEq total) by mouth daily. Marland Kitchen  terbinafine (LAMISIL) 250 MG tablet, Take 1 tablet daily for Toenail Fungus .  vitamin C (ASCORBIC ACID) 500 MG tablet, Take 500 mg by mouth daily.  Medication adherence: yes, however was taking insulin 25 units BID rather than updated recommendation of 28 units and 26 units Side effects/intolerances reviewed: yes Refills needed:no  Health Logs:  Glucose - fasting for past week - 192, 197, 189, 174, 180, 162, 190, 180                  Glucose prior to dinner - 200, 193, 158, 177, 151, 181, 146   BPs - 120s-130s/60s-70s, no dizziness, fatigue, chest pain, dyspnea  Goals Reviewed:  Goals    . HEMOGLOBIN A1C < 8.0     You can continue the onglyza 2.5 mg a day Suggest taking your night time insulin WITH your dinner  Do this for 2 weeks and if your morning sugar is still above 175, increase your insulin dose by 2 units morning and night You would go from 24 to 26 and 27 to 29 or 30 at night.     . LDL CALC <  70    . Weight (lb) < 200 lb (90.7 kg)     Try to add in more veggies for weight loss Keep the food diary and monitor what foods make your sugars go up  Suggest trying chair aerobics, getting a pool or can try tai chi This will help a lot with your joints, with your sugars and movement       Progress towards goals: static; change needed Barriers Identified:  no barriers identified  Any patient concerns?:  No concerns at this time  Plan:   Continue daily monitoring of BP and glucose BID Increase AM insulin to 28 units, PM to 26 units Continue to monitor; call if any concern with persistently elevated glucose or with any low sugars prior to scheduled appointment in April with  Estill Bamberg.     Future Appointments  Date Time Provider [place]  06/10/2018  3:15 PM Gardiner Barefoot, DPM [facility]  06/15/2018 11:30 AM Vicie Mutters, PA-C [facility] None  09/21/2018 10:00 AM Unk Pinto, MD [facility] None  12/17/2018  9:30 AM Ward Givens, NP [facility] None  12/18/2018 10:30 AM Vicie Mutters, PA-C [facility] None     Time Spent: [place], NP

## 2018-06-10 ENCOUNTER — Ambulatory Visit: Payer: Medicare Other | Admitting: Podiatry

## 2018-06-11 NOTE — Progress Notes (Signed)
FOLLOW UP  Assessment and Plan:   Comprehensive care plan:  Patient/caregiver was given comprehensive care plan We will continue to monitor these goals every 3 months with an office visit and every month by a telephone call  Patient can contact the office any time with the phone number and get to a provider via the answering service or they can use Mychart.   Verbal permission was received from the patient to review comprehensive care management, they understand they have the right to stop CCM services at any time.   The patient is self managing medications at home.  Medications were reviewed with the patient today as well as adherence and potential interactions.   The patient does not need home health services at this time.   Hypertension Well controlled with current medications  Monitor blood pressure at home; patient to call if consistently greater than 130/80 Continue DASH diet.   Reminder to go to the ER if any CP, SOB, nausea, dizziness, severe HA, changes vision/speech, left arm numbness and tingling and jaw pain.  Cholesterol Currently above goal; diabetic; no longer treated secondary to age -  Continue low cholesterol diet and exercise.  Check lipid panel.   Diabetes with diabetic chronic kidney disease  Continue medication: humulin 70/30 takes 40 units once daily at 2 pm, unclear why this dosing schedule was initially initiated; discussed pending A1C results will switch to BID dosing starting 25 units AM and 20 units PM Continue diet and exercise.  Perform daily foot/skin check, notify office of any concerning changes.  Check A1C  Seizure disorder No recent seizures; continue keppra  Followed by Dr. Brett Fairy Check keppra levels  CKD stage 4 Medications have been adjusted, would not have dialysis, advised to avoid NSAIDS Increase fluids, avoid NSAIDS, monitor sugars, will monitor  Obesity with co morbidities Long discussion about weight loss, diet, and  exercise Recommended diet heavy in fruits and veggies and low in animal meats, cheeses, and dairy products, appropriate calorie intake Discussed ideal weight for height  Will follow up in 3 months  Hypothyroidism continue medications the same pending lab results reminded to take on an empty stomach 30-56mins before food.  check TSH level  Vitamin D Def At goal at last visit; continue supplementation to maintain goal of 70-100 Defer Vit D level  Continue diet and meds as discussed. Further disposition pending results of labs. Discussed med's effects and SE's.   Over 30 minutes of exam, counseling, chart review, and critical decision making was performed.   Future Appointments  Date Time Provider [place]  09/21/2018 10:00 AM Unk Pinto, MD [facility] None  12/17/2018  9:30 AM Ward Givens, NP [facility] None  12/18/2018 10:30 AM Vicie Mutters, PA-C [facility] None    ----------------------------------------------------------------------------------------------------------------------  HPI 85 y.o. female  presents for 3 month follow up on hypertension, cholesterol, diabetes with CKD 4, hypothyroid, morbid obesity and vitamin D deficiency.   She is living at [facility], she is able to go outdoors, she is doing well.   Patient also has hx/o Seizure Disorder manifest with fugue states /automatisms controlled on Keppra and followed by Dr Brett Fairy.    BMI is Body mass index is 38.89 kg/m., she has been working on diet - limited in exercise.  Wt Readings from Last 3 Encounters:  06/15/18 212 lb 9.6 oz (96.4 kg)  03/16/18 211 lb (95.7 kg)  02/16/18 213 lb (96.6 kg)   Her blood pressure has been controlled at home, today their BP is  BP: 128/74  She does not workout. She denies chest pain, shortness of breath, dizziness.   She is on cholesterol medication, crestor 5 mg and at goal and denies myalgias. Her cholesterol is not at goal. The cholesterol last visit was:    Lab Results  Component Value Date   CHOL 105 03/16/2018   HDL 26 (L) 03/16/2018   LDLCALC 52 03/16/2018   TRIG 197 (H) 03/16/2018   CHOLHDL 4.0 03/16/2018    She has been working on diet and exercise for T2 diabetes, and denies foot ulcerations, hypoglycemia , increased appetite, nausea, polydipsia, polyuria, visual disturbances, vomiting and weight loss. She does check fasting sugars, which have ranged 140's-160's. She will eat a late breakfast and skip lunch and eat dinner. She is doing 70/30 doing 28 units in the morning and 26 at night, no low sugars. off metformin due to CKD stage 4 and decline. Last A1C in the office was:  Lab Results  Component Value Date   HGBA1C 8.4 (H) 03/16/2018    Lab Results  Component Value Date   GFRNONAA 24 (L) 03/16/2018   She is on thyroid medication. Her medication was not changed last visit.   Lab Results  Component Value Date   TSH 0.62 03/16/2018   Patient is on Vitamin D supplement and was at goal at recent check:   Lab Results  Component Value Date   VD25OH 88 03/16/2018      Current Medications:  Current Outpatient Medications on File Prior to Visit  Medication Sig  . acetaminophen (TYLENOL) 500 MG tablet Take 1,000 mg by mouth every 6 (six) hours as needed for moderate pain.  Marland Kitchen aspirin 81 MG tablet Take 81 mg by mouth daily.  Marland Kitchen atenolol (TENORMIN) 50 MG tablet Take 1 tablet (50 mg total) by mouth daily.  . Biotin 5000 MCG TABS Take 5,000 Units by mouth 2 (two) times daily.   . ferrous sulfate dried (SLOW FE) 160 (50 FE) MG TBCR Take 160 mg by mouth 2 (two) times daily.   . fish oil-omega-3 fatty acids 1000 MG capsule Take 2 g by mouth at bedtime.   . Flaxseed, Linseed, (FLAX SEED OIL) 1000 MG CAPS Take 1 capsule by mouth 3 (three) times daily.  . furosemide (LASIX) 40 MG tablet TAKE 1 TABLET BY MOUTH EVERY DAY  . glucose blood (FREESTYLE INSULINX TEST) test strip Check glucose 3 x/day before meals  . insulin NPH-regular Human  (NOVOLIN 70/30) (70-30) 100 UNIT/ML injection Takes 28 units qam and 26 units qpm  . Lancets (FREESTYLE) lancets Use to check glucose 3  x/day  . lansoprazole (PREVACID) 30 MG capsule TAKE 1 CAPSULE BY MOUTH EVERY DAY  . levETIRAcetam (KEPPRA) 500 MG tablet Take 1 tablet 3 x /day after meals to prevent seizures  . levothyroxine (SYNTHROID, LEVOTHROID) 100 MCG tablet Take 1 tablet (100 mcg total) by mouth daily.  Marland Kitchen loratadine (CLARITIN) 10 MG tablet Take 10 mg by mouth daily.  . magnesium oxide (MAG-OX) 400 MG tablet Take 400 mg by mouth 3 (three) times daily.   . meclizine (ANTIVERT) 25 MG tablet TAKE 1 TABLET BY MOUTH DAILY  . potassium chloride SA (K-DUR,KLOR-CON) 20 MEQ tablet Take 1 tablet (20 mEq total) by mouth daily.  . rosuvastatin (CRESTOR) 5 MG tablet Take 1 tablet (5 mg total) by mouth at bedtime.  . terbinafine (LAMISIL) 250 MG tablet Take 1 tablet daily for Toenail Fungus  . vitamin B-12 (CYANOCOBALAMIN) 100 MCG tablet Take 50  mcg by mouth daily.  . vitamin C (ASCORBIC ACID) 500 MG tablet Take 500 mg by mouth daily.   No current facility-administered medications on file prior to visit.      Allergies:  Allergies  Allergen Reactions  . Ppd [Tuberculin Purified Protein Derivative] Other (See Comments)    indurated     Medical History:  Past Medical History:  Diagnosis Date  . Anemia, unspecified   . Arthritis    "knees; right shoulder" (09/15/2013)  . Basal cell carcinoma    "right upper outer lip"  . DM neuropathy, type II diabetes mellitus ([place])   . Epilepsy ([place])   . GERD (gastroesophageal reflux disease)   . Gout   . High cholesterol   . Hypertension   . Hypothyroidism   . Meniere's disease   . Obesity (BMI 30-39.9)   . Other forms of epilepsy and recurrent seizures without mention of intractable epilepsy 11/04/2012   Non convulsive paroxysmal spells, responding to [facility], patient not driving.   . Pneumonia ~ 1943  . Skin cancer    "forehead; right hand"  .  Type II or unspecified type diabetes mellitus without mention of complication, not stated as uncontrolled   . UTI (urinary tract infection)   . Vitamin D deficiency    Family history- Reviewed and unchanged Social history- Reviewed and unchanged   Review of Systems:  Review of Systems  Constitutional: Negative for malaise/fatigue and weight loss.  HENT: Negative for hearing loss and tinnitus.   Eyes: Negative for blurred vision and double vision.  Respiratory: Negative for cough, shortness of breath and wheezing.   Cardiovascular: Negative for chest pain, palpitations, orthopnea, claudication and leg swelling.  Gastrointestinal: Negative for abdominal pain, blood in stool, constipation, diarrhea, heartburn, melena, nausea and vomiting.  Genitourinary: Negative.   Musculoskeletal: Positive for joint pain. Negative for falls and myalgias.  Skin: Negative for rash.  Neurological: Negative for dizziness, tingling, sensory change, weakness and headaches.  Endo/Heme/Allergies: Negative for polydipsia.  Psychiatric/Behavioral: Negative.   All other systems reviewed and are negative.   Physical Exam: BP 128/74   Pulse 66   Temp 97.7 F (36.5 C)   Ht 5\' 2"  (1.575 m)   Wt 212 lb 9.6 oz (96.4 kg)   SpO2 97%   BMI 38.89 kg/m  Wt Readings from Last 3 Encounters:  06/15/18 212 lb 9.6 oz (96.4 kg)  03/16/18 211 lb (95.7 kg)  02/16/18 213 lb (96.6 kg)   General Appearance: Well nourished, in no apparent distress. Eyes: PERRLA, EOMs, conjunctiva no swelling or erythema Sinuses: No Frontal/maxillary tenderness ENT/Mouth: Ext aud canals clear, TMs without erythema, bulging. No erythema, swelling, or exudate on post pharynx.  Tonsils not swollen or erythematous. Hearing normal.  Neck: Supple, thyroid normal.  Respiratory: Respiratory effort normal, BS equal bilaterally without rales, rhonchi, wheezing or stridor.  Cardio: RRR with no RGs, very mild 2/6 early systolic murmur at L 2nd ICD  without radiation. Symmetrical peripheral pulses without notable edema (wearing compression hose).  Abdomen: Soft, + BS.  Non tender, no guarding, rebound, hernias, masses. Lymphatics: Non tender without lymphadenopathy.  Musculoskeletal: Full ROM, Symmetrical strength, Slow gait Skin: Warm, dry without rashes, lesions, ecchymosis. Bilateral feet skin intact.  Neuro: Cranial nerves intact. No cerebellar symptoms.  Psych: Awake and oriented X 3, normal affect, Insight and Judgment appropriate.    Vicie Mutters, PA-C 11:36 AM [facility]

## 2018-06-15 ENCOUNTER — Encounter: Payer: Self-pay | Admitting: Physician Assistant

## 2018-06-15 ENCOUNTER — Ambulatory Visit (INDEPENDENT_AMBULATORY_CARE_PROVIDER_SITE_OTHER): Payer: Medicare Other | Admitting: Physician Assistant

## 2018-06-15 ENCOUNTER — Other Ambulatory Visit: Payer: Self-pay

## 2018-06-15 VITALS — BP 128/74 | HR 66 | Temp 97.7°F | Ht 62.0 in | Wt 212.6 lb

## 2018-06-15 DIAGNOSIS — I10 Essential (primary) hypertension: Secondary | ICD-10-CM | POA: Diagnosis not present

## 2018-06-15 DIAGNOSIS — IMO0001 Reserved for inherently not codable concepts without codable children: Secondary | ICD-10-CM

## 2018-06-15 DIAGNOSIS — E119 Type 2 diabetes mellitus without complications: Secondary | ICD-10-CM

## 2018-06-15 DIAGNOSIS — Z794 Long term (current) use of insulin: Secondary | ICD-10-CM

## 2018-06-15 DIAGNOSIS — E1122 Type 2 diabetes mellitus with diabetic chronic kidney disease: Secondary | ICD-10-CM

## 2018-06-15 DIAGNOSIS — E785 Hyperlipidemia, unspecified: Secondary | ICD-10-CM

## 2018-06-15 DIAGNOSIS — Z79899 Other long term (current) drug therapy: Secondary | ICD-10-CM

## 2018-06-15 DIAGNOSIS — N184 Chronic kidney disease, stage 4 (severe): Secondary | ICD-10-CM

## 2018-06-15 DIAGNOSIS — E1169 Type 2 diabetes mellitus with other specified complication: Secondary | ICD-10-CM | POA: Diagnosis not present

## 2018-06-15 DIAGNOSIS — E782 Mixed hyperlipidemia: Secondary | ICD-10-CM

## 2018-06-15 DIAGNOSIS — E039 Hypothyroidism, unspecified: Secondary | ICD-10-CM

## 2018-06-15 DIAGNOSIS — G40909 Epilepsy, unspecified, not intractable, without status epilepticus: Secondary | ICD-10-CM

## 2018-06-15 DIAGNOSIS — Z789 Other specified health status: Secondary | ICD-10-CM

## 2018-06-15 DIAGNOSIS — E1149 Type 2 diabetes mellitus with other diabetic neurological complication: Secondary | ICD-10-CM

## 2018-06-15 DIAGNOSIS — R300 Dysuria: Secondary | ICD-10-CM

## 2018-06-15 MED ORDER — SAXAGLIPTIN HCL 5 MG PO TABS: 5.0000 mg | ORAL_TABLET | Freq: Every day | ORAL | 1 refills | Status: DC

## 2018-06-15 NOTE — Patient Instructions (Signed)
When it comes to diets, agreement about the perfect plan isn't easy to find, even among the experts. Experts at the [employer] developed an idea known as the Healthy Eating Plate. Just imagine a plate divided into logical, healthy portions.  The emphasis is on diet quality:  Load up on vegetables and fruits - one-half of your plate: Aim for color and variety, and remember that potatoes don't count.  Go for whole grains - one-quarter of your plate: Whole wheat, barley, wheat berries, quinoa, oats, brown rice, and foods made with them. If you want pasta, go with whole wheat pasta.  Protein power - one-quarter of your plate: Fish, chicken, beans, and nuts are all healthy, versatile protein sources. Limit red meat.  The diet, however, does go beyond the plate, offering a few other suggestions.  Use healthy plant oils, such as olive, canola, soy, corn, sunflower and peanut. Check the labels, and avoid partially hydrogenated oil, which have unhealthy trans fats.  If you're thirsty, drink water. Coffee and tea are good in moderation, but skip sugary drinks and limit milk and dairy products to one or two daily servings.  The type of carbohydrate in the diet is more important than the amount. Some sources of carbohydrates, such as vegetables, fruits, whole grains, and beans-are healthier than others.  Finally, stay active.  Chronic Care Management  This is your comprehensive care plan.  We will continue to monitor these chronic health issues and see if we are achieving our goals every 3 months or more.  You can contact the office any time with the office phone number and get to a provider via the answering service or you can use Mychart, if you are not signed up they can do so at the front office.  Here is some information that we discussed and went over below.    These are the goals we discussed: Goals (1 Years of Data) as of 06/15/2018 at 11:52 AM            Today   03/16/18   02/16/18   01/12/18   12/15/17     Result Component   . HEMOGLOBIN A1C < 8.0     8.4          Note edited  05/19/2018  4:42 PM by [name], NP    You can continue the onglyza 2.5 mg a day Suggest taking your night time insulin WITH your dinner  Fasting glucose goal <150 Keep daily BID glucose log and bring to each visit Increase insulin to 28 units AM, 26 units PM     . LDL CALC < 70                Weight   . Weight (lb) < 200 lb (90.7 kg)   212 lb 9.6 oz (96.4 kg)  211 lb (95.7 kg)  213 lb (96.6 kg)  210 lb 12.8 oz (95.6 kg)  208 lb 9.6 oz (94.6 kg)    Note created  02/16/2018  3:09 PM by Vicie Mutters, PA-C    Try to add in more veggies for weight loss Keep the food diary and monitor what foods make your sugars go up  Suggest trying chair aerobics, getting a pool or can try tai chi This will help a lot with your joints, with your sugars and movement        These are your follow up appointments: Future Appointments  Date Time Provider Department  Center  09/21/2018 10:00 AM Unk Pinto, MD [facility] None  12/17/2018  9:30 AM Ward Givens, NP [facility] None  12/18/2018 10:30 AM Vicie Mutters, PA-C [facility] None

## 2018-06-16 LAB — URINALYSIS, ROUTINE W REFLEX MICROSCOPIC
Bacteria, UA: NONE SEEN /HPF
Bilirubin Urine: NEGATIVE
Glucose, UA: NEGATIVE
Hgb urine dipstick: NEGATIVE
Hyaline Cast: NONE SEEN /LPF
Ketones, ur: NEGATIVE
Nitrite: NEGATIVE
Protein, ur: NEGATIVE
RBC / HPF: NONE SEEN /HPF (ref 0–2)
Specific Gravity, Urine: 1.008 (ref 1.001–1.03)
Squamous Epithelial / HPF: NONE SEEN /HPF (ref <=5)
pH: 6 (ref 5.0–8.0)

## 2018-06-16 LAB — URINE CULTURE
MICRO NUMBER:: 425387
Result:: NO GROWTH
SPECIMEN QUALITY:: ADEQUATE

## 2018-06-18 LAB — LIPID PANEL
Cholesterol: 87 mg/dL (ref <200)
HDL: 25 mg/dL — ABNORMAL LOW (ref >=50)
LDL Cholesterol (Calc): 35 mg/dL (calc)
Non-HDL Cholesterol (Calc): 62 mg/dL (calc) (ref <130)
Total CHOL/HDL Ratio: 3.5 (calc) (ref <5.0)
Triglycerides: 199 mg/dL — ABNORMAL HIGH (ref <150)

## 2018-06-18 LAB — COMPLETE METABOLIC PANEL WITH GFR
AG Ratio: 1.8 (calc) (ref 1.0–2.5)
ALT: 15 U/L (ref 6–29)
AST: 13 U/L (ref 10–35)
Albumin: 4.3 g/dL (ref 3.6–5.1)
Alkaline phosphatase (APISO): 44 U/L (ref 37–153)
BUN/Creatinine Ratio: 17 (calc) (ref 6–22)
BUN: 34 mg/dL — ABNORMAL HIGH (ref 7–25)
CO2: 28 mmol/L (ref 20–32)
Calcium: 10.6 mg/dL — ABNORMAL HIGH (ref 8.6–10.4)
Chloride: 106 mmol/L (ref 98–110)
Creat: 1.99 mg/dL — ABNORMAL HIGH (ref 0.60–0.88)
GFR, Est African American: 26 mL/min/{1.73_m2} — ABNORMAL LOW (ref >=60)
GFR, Est Non African American: 23 mL/min/{1.73_m2} — ABNORMAL LOW (ref >=60)
Globulin: 2.4 g/dL (calc) (ref 1.9–3.7)
Glucose, Bld: 155 mg/dL — ABNORMAL HIGH (ref 65–99)
Potassium: 4.4 mmol/L (ref 3.5–5.3)
Sodium: 141 mmol/L (ref 135–146)
Total Bilirubin: 0.3 mg/dL (ref 0.2–1.2)
Total Protein: 6.7 g/dL (ref 6.1–8.1)

## 2018-06-18 LAB — CBC WITH DIFFERENTIAL/PLATELET
Absolute Monocytes: 415 cells/uL (ref 200–950)
Basophils Absolute: 30 cells/uL (ref 0–200)
Basophils Relative: 0.6 %
Eosinophils Absolute: 495 cells/uL (ref 15–500)
Eosinophils Relative: 9.9 %
HCT: 36 % (ref 35.0–45.0)
Hemoglobin: 11.5 g/dL — ABNORMAL LOW (ref 11.7–15.5)
Lymphs Abs: 805 cells/uL — ABNORMAL LOW (ref 850–3900)
MCH: 28.8 pg (ref 27.0–33.0)
MCHC: 31.9 g/dL — ABNORMAL LOW (ref 32.0–36.0)
MCV: 90 fL (ref 80.0–100.0)
MPV: 10.8 fL (ref 7.5–12.5)
Monocytes Relative: 8.3 %
Neutro Abs: 3255 cells/uL (ref 1500–7800)
Neutrophils Relative %: 65.1 %
Platelets: 309 10*3/uL (ref 140–400)
RBC: 4 10*6/uL (ref 3.80–5.10)
RDW: 17 % — ABNORMAL HIGH (ref 11.0–15.0)
Total Lymphocyte: 16.1 %
WBC: 5 10*3/uL (ref 3.8–10.8)

## 2018-06-18 LAB — MAGNESIUM: Magnesium: 2.5 mg/dL (ref 1.5–2.5)

## 2018-06-18 LAB — HEMOGLOBIN A1C
Hgb A1c MFr Bld: 7.7 % of total Hgb — ABNORMAL HIGH (ref <5.7)
Mean Plasma Glucose: 174 (calc)
eAG (mmol/L): 9.7 (calc)

## 2018-06-18 LAB — TSH: TSH: 1.49 mIU/L (ref 0.40–4.50)

## 2018-06-18 LAB — LEVETIRACETAM LEVEL: Keppra (Levetiracetam): 41.7 ug/mL (ref 12.0–46.0)

## 2018-06-22 ENCOUNTER — Other Ambulatory Visit: Payer: Self-pay | Admitting: *Deleted

## 2018-06-22 DIAGNOSIS — E785 Hyperlipidemia, unspecified: Principal | ICD-10-CM

## 2018-06-22 DIAGNOSIS — E1169 Type 2 diabetes mellitus with other specified complication: Secondary | ICD-10-CM

## 2018-06-22 MED ORDER — ROSUVASTATIN CALCIUM 5 MG PO TABS: 5.0000 mg | ORAL_TABLET | Freq: Every day | ORAL | 1 refills | Status: DC

## 2018-06-27 ENCOUNTER — Other Ambulatory Visit: Payer: Self-pay | Admitting: Internal Medicine

## 2018-06-29 ENCOUNTER — Ambulatory Visit: Payer: Self-pay

## 2018-06-29 ENCOUNTER — Telehealth (INDEPENDENT_AMBULATORY_CARE_PROVIDER_SITE_OTHER): Payer: Medicare Other

## 2018-06-29 DIAGNOSIS — E1169 Type 2 diabetes mellitus with other specified complication: Secondary | ICD-10-CM

## 2018-06-29 DIAGNOSIS — E1149 Type 2 diabetes mellitus with other diabetic neurological complication: Secondary | ICD-10-CM | POA: Diagnosis not present

## 2018-06-29 DIAGNOSIS — Z789 Other specified health status: Secondary | ICD-10-CM

## 2018-06-29 DIAGNOSIS — E119 Type 2 diabetes mellitus without complications: Secondary | ICD-10-CM | POA: Diagnosis not present

## 2018-06-29 DIAGNOSIS — Z794 Long term (current) use of insulin: Secondary | ICD-10-CM

## 2018-06-29 DIAGNOSIS — I10 Essential (primary) hypertension: Secondary | ICD-10-CM

## 2018-06-29 DIAGNOSIS — E785 Hyperlipidemia, unspecified: Secondary | ICD-10-CM

## 2018-06-29 DIAGNOSIS — IMO0001 Reserved for inherently not codable concepts without codable children: Secondary | ICD-10-CM

## 2018-06-29 NOTE — Telephone Encounter (Signed)
CCM telephone visit  Medications reviewed/reconciled: Yes  Current Outpatient Medications (Endocrine & Metabolic):  .  insulin NPH-regular Human (NOVOLIN 70/30) (70-30) 100 UNIT/ML injection, Takes 28 units qam and 26 units qpm .  levothyroxine (SYNTHROID, LEVOTHROID) 100 MCG tablet, Take 1 tablet (100 mcg total) by mouth daily. .  saxagliptin HCl (ONGLYZA) 5 MG TABS tablet, Take 1 tablet (5 mg total) by mouth daily.  Current Outpatient Medications (Cardiovascular):  .  atenolol (TENORMIN) 50 MG tablet, Take 1 tablet (50 mg total) by mouth daily. .  furosemide (LASIX) 40 MG tablet, TAKE 1 TABLET BY MOUTH EVERY DAY .  rosuvastatin (CRESTOR) 5 MG tablet, Take 1 tablet (5 mg total) by mouth at bedtime.  Current Outpatient Medications (Respiratory):  .  loratadine (CLARITIN) 10 MG tablet, Take 10 mg by mouth daily.  Current Outpatient Medications (Analgesics):  .  acetaminophen (TYLENOL) 500 MG tablet, Take 1,000 mg by mouth every 6 (six) hours as needed for moderate pain. Marland Kitchen  aspirin 81 MG tablet, Take 81 mg by mouth daily.  Current Outpatient Medications (Hematological):  .  ferrous sulfate dried (SLOW FE) 160 (50 FE) MG TBCR, Take 160 mg by mouth 2 (two) times daily.  .  vitamin B-12 (CYANOCOBALAMIN) 100 MCG tablet, Take 50 mcg by mouth daily.  Current Outpatient Medications (Other):  .  Biotin 5000 MCG TABS, Take 5,000 Units by mouth 2 (two) times daily.  .  fish oil-omega-3 fatty acids 1000 MG capsule, Take 2 g by mouth at bedtime.  .  Flaxseed, Linseed, (FLAX SEED OIL) 1000 MG CAPS, Take 1 capsule by mouth 3 (three) times daily. Marland Kitchen  glucose blood (FREESTYLE INSULINX TEST) test strip, Check glucose 3 x/day before meals .  Lancets (FREESTYLE) lancets, Use to check glucose 3  x/day .  lansoprazole (PREVACID) 30 MG capsule, TAKE 1 CAPSULE BY MOUTH EVERY DAY .  levETIRAcetam (KEPPRA) 500 MG tablet, Take 1 tablet 3 x /day after meals to prevent seizures .  magnesium oxide (MAG-OX) 400 MG  tablet, Take 400 mg by mouth 3 (three) times daily.  .  meclizine (ANTIVERT) 25 MG tablet, Take 1 tablet up yo 3 x / day if needed for Dizziness / Vertigo .  potassium chloride SA (K-DUR,KLOR-CON) 20 MEQ tablet, Take 1 tablet (20 mEq total) by mouth daily. Marland Kitchen  terbinafine (LAMISIL) 250 MG tablet, Take 1 tablet daily for Toenail Fungus .  vitamin C (ASCORBIC ACID) 500 MG tablet, Take 500 mg by mouth daily.  Medication adherence: yes, patient reports that she is taking insulin as follows. Takes 28 units in the morning and 26 units at dinner. Any Side effects? Patient reports no side effect at this time.  Refills needed:patient reported no refill were needed at this time.  Health Logs:  Glucose-in the morning: starting with Monday thru Sunday numbers in order---160, 166, 142, 148, 157, 165, 154.  Glucose at dinner time per pt: 106, 118, 179, 134, 156, 148, 156.  BP's--patient did not report any blood pressure number at this time. But she did state that her blood pressure has been doing well.   Goals Reviewed:  Goals    . HEMOGLOBIN A1C < 8.0     You can continue the onglyza 2.5 mg a day Suggest taking your night time insulin WITH your dinner  Fasting glucose goal <150 Keep daily BID glucose log and bring to each visit Increase insulin to 28 units AM, 26 units PM     . LDL CALC <  70    . Weight (lb) < 200 lb (90.7 kg)     Try to add in more veggies for weight loss Keep the food diary and monitor what foods make your sugars go up  Suggest trying chair aerobics, getting a pool or can try tai chi This will help a lot with your joints, with your sugars and movement       Any patient concerns?:  Patient did report LEG pain for the past 2 days Tylenol has helped some  Plan: Continue daily monitoring of BP and glucose BID  Continue to monitor; call if any concern with persistently elevated glucose or with any low sugars        Time Spent: 20 mins  [name] [name], CMA

## 2018-06-29 NOTE — Progress Notes (Signed)
Entered in error

## 2018-08-18 ENCOUNTER — Other Ambulatory Visit: Payer: Self-pay | Admitting: Internal Medicine

## 2018-08-20 ENCOUNTER — Telehealth (INDEPENDENT_AMBULATORY_CARE_PROVIDER_SITE_OTHER): Payer: Medicare Other

## 2018-08-20 DIAGNOSIS — Z794 Long term (current) use of insulin: Secondary | ICD-10-CM

## 2018-08-20 DIAGNOSIS — IMO0001 Reserved for inherently not codable concepts without codable children: Secondary | ICD-10-CM

## 2018-08-20 DIAGNOSIS — I10 Essential (primary) hypertension: Secondary | ICD-10-CM | POA: Diagnosis not present

## 2018-08-20 DIAGNOSIS — Z789 Other specified health status: Secondary | ICD-10-CM | POA: Diagnosis not present

## 2018-08-20 DIAGNOSIS — E119 Type 2 diabetes mellitus without complications: Secondary | ICD-10-CM | POA: Diagnosis not present

## 2018-08-20 NOTE — Telephone Encounter (Signed)
CCM telephone visit  Medications reviewed/reconciled: YES   Current Outpatient Medications (Endocrine & Metabolic):  .  insulin NPH-regular Human (NOVOLIN 70/30) (70-30) 100 UNIT/ML injection, Takes 28 units qam and 26 units qpm .  levothyroxine (SYNTHROID) 100 MCG tablet, TAKE 1 TABLET BY MOUTH DAILY .  saxagliptin HCl (ONGLYZA) 5 MG TABS tablet, Take 1 tablet (5 mg total) by mouth daily.  Current Outpatient Medications (Cardiovascular):  .  atenolol (TENORMIN) 50 MG tablet, Take 1 tablet (50 mg total) by mouth daily. .  furosemide (LASIX) 40 MG tablet, TAKE 1 TABLET BY MOUTH EVERY DAY .  rosuvastatin (CRESTOR) 5 MG tablet, Take 1 tablet (5 mg total) by mouth at bedtime.  Current Outpatient Medications (Respiratory):  .  loratadine (CLARITIN) 10 MG tablet, Take 10 mg by mouth daily.  Current Outpatient Medications (Analgesics):  .  acetaminophen (TYLENOL) 500 MG tablet, Take 1,000 mg by mouth every 6 (six) hours as needed for moderate pain. Marland Kitchen  aspirin 81 MG tablet, Take 81 mg by mouth daily.  Current Outpatient Medications (Hematological):  .  ferrous sulfate dried (SLOW FE) 160 (50 FE) MG TBCR, Take 160 mg by mouth 2 (two) times daily.  .  vitamin B-12 (CYANOCOBALAMIN) 100 MCG tablet, Take 50 mcg by mouth daily.  Current Outpatient Medications (Other):  .  Biotin 5000 MCG TABS, Take 5,000 Units by mouth 2 (two) times daily.  .  fish oil-omega-3 fatty acids 1000 MG capsule, Take 2 g by mouth at bedtime.  .  Flaxseed, Linseed, (FLAX SEED OIL) 1000 MG CAPS, Take 1 capsule by mouth 3 (three) times daily. Marland Kitchen  glucose blood (FREESTYLE INSULINX TEST) test strip, Check glucose 3 x/day before meals .  Lancets (FREESTYLE) lancets, Use to check glucose 3  x/day .  lansoprazole (PREVACID) 30 MG capsule, TAKE 1 CAPSULE BY MOUTH EVERY DAY .  levETIRAcetam (KEPPRA) 500 MG tablet, Take 1 tablet 3 x /day after meals to prevent seizures .  magnesium oxide (MAG-OX) 400 MG tablet, Take 400 mg by mouth  3 (three) times daily.  .  meclizine (ANTIVERT) 25 MG tablet, Take 1 tablet up yo 3 x / day if needed for Dizziness / Vertigo .  potassium chloride SA (K-DUR,KLOR-CON) 20 MEQ tablet, Take 1 tablet (20 mEq total) by mouth daily. Marland Kitchen  terbinafine (LAMISIL) 250 MG tablet, Take 1 tablet daily for Toenail Fungus .  vitamin C (ASCORBIC ACID) 500 MG tablet, Take 500 mg by mouth daily.  Medication adherence: YES, PATIENT reports that she is taking her insulin as follows: 28 units---AM & 26 units at [facility]  Any Side effects? Patient reports no side effects at this time.  Refills needed: No refills were needed  Health Logs:  [place] Starting With June 8th to June 24th  8th---171 9th--184 10th--172 11th---172 12th--171 13th--184 14th--157 15th--168 16th--169 17th--162 18th--180 19th--168 20th--178 21st-- 144 22nd--151 23rd--141 24th--138  Glucose-at [facility] Starting With June 8th to June 23rd  8th---179 9th--166 10th--152 11th---142 12th--187 13th--178 14th--158 15th--128 16th--156 17th--173 18th--141 19th--137 20th--97 21st-- 135 22nd--99 23rd--101  Patient dose not check blood pressure at home.     Goals Reviewed:  Goals    . HEMOGLOBIN A1C < 8.0     You can continue the onglyza 2.5 mg a day Suggest taking your night time insulin WITH your dinner  Fasting glucose goal <150 Keep daily BID glucose log and bring to each visit Increase insulin to 28 units AM, 26 units PM     .  LDL CALC < 70    . Weight (lb) < 200 lb (90.7 kg)     Try to add in more veggies for weight loss Keep the food diary and monitor what foods make your sugars go up  Suggest trying chair aerobics, getting a pool or can try tai chi This will help a lot with your joints, with your sugars and movement       Any patient concerns: Patient reports no concerns at this time.  Plan: Continue daily monitoring of sugars & to start checking blood pressure.       Time  Spent: 20 mins  Elenor Quinones, CMA

## 2018-09-08 ENCOUNTER — Other Ambulatory Visit: Payer: Self-pay | Admitting: Internal Medicine

## 2018-09-20 ENCOUNTER — Encounter: Payer: Self-pay | Admitting: Internal Medicine

## 2018-09-20 DIAGNOSIS — N2581 Secondary hyperparathyroidism of renal origin: Secondary | ICD-10-CM | POA: Insufficient documentation

## 2018-09-20 NOTE — Progress Notes (Signed)
Annual Screening/Preventative Visit & Comprehensive Evaluation &  Examination     This very nice 85 y.o. WWF presents for a Screening /Preventative Visit & comprehensive evaluation and management of multiple medical co-morbidities.  Patient has been followed for HTN, HLD, T2_IDDM/CKD4  and Vitamin D Deficiency. Patient has hx/o a Seizure Disorder & is followed by Dr Brett Fairy.      HTN predates since 1999. Patient's BP has been controlled at home and patient denies any cardiac symptoms as chest pain, palpitations, shortness of breath, dizziness or ankle swelling.  Patient does have hx/o Ao sys M with thickened Ao valve leaflets & no stenosis or regurg in 2015. Today's BP is at goal - 124/60.      Patient's hyperlipidemia is controlled with diet and medications. Patient denies myalgias or other medication SE's. Last lipids were at goal albeit elevated Trig's: Lab Results  Component Value Date   CHOL 87 06/15/2018   HDL 25 (L) 06/15/2018   LDLCALC 35 06/15/2018   TRIG 199 (H) 06/15/2018   CHOLHDL 3.5 06/15/2018      Patient has Morbid Obesity (BMI 37+) and  consequent T2_IDDM (2001) w/CKD4 GFR 25) and is on Novolin 70/30 bid (usually 28 u qam & 25 u qpm) and CBG's range between 110 to 160 MG%. Patient has secondary HyperPTH &  also is followed by Dr Harrie Jeans / Nephrology.  Patient denies reactive hypoglycemic symptoms, visual blurring, diabetic polys, but does report numb paresthesias of her feet.   Patient has long hx/o gluttony & overeating and last A1c was not at goal: Lab Results  Component Value Date   HGBA1C 7.7 (H) 06/15/2018      Patient has dx'd Hypothyroid in the 47's and has been on replacement since.     Finally, patient has history of Vitamin D Deficiency ("37" / 2008)  and last Vitamin D was at goal: Lab Results  Component Value Date   VD25OH 88 03/16/2018   Current Outpatient Medications on File Prior to Visit  Medication Sig  . acetaminophen (TYLENOL) 500 MG tablet  Take 1,000 mg by mouth every 6 (six) hours as needed for moderate pain.  Marland Kitchen aspirin 81 MG tablet Take 81 mg by mouth daily.  Marland Kitchen atenolol (TENORMIN) 50 MG tablet TAKE 1 TABLET BY MOUTH DAILY  . ferrous sulfate dried (SLOW FE) 160 (50 FE) MG TBCR Take 160 mg by mouth 2 (two) times daily.   . fish oil-omega-3 fatty acids 1000 MG capsule Take 2 g by mouth at bedtime.   . Flaxseed, Linseed, (FLAX SEED OIL) 1000 MG CAPS Take 1 capsule by mouth 3 (three) times daily.  . furosemide (LASIX) 40 MG tablet TAKE 1 TABLET BY MOUTH EVERY DAY  . glucose blood (FREESTYLE INSULINX TEST) test strip Check glucose 3 x/day before meals  . insulin NPH-regular Human (NOVOLIN 70/30) (70-30) 100 UNIT/ML injection Takes 28 units qam and 26 units qpm (Patient taking differently: Takes 28 units qam and 25 units qpm)  . Lancets (FREESTYLE) lancets Use to check glucose 3  x/day  . lansoprazole (PREVACID) 30 MG capsule TAKE 1 CAPSULE BY MOUTH EVERY DAY  . levETIRAcetam (KEPPRA) 500 MG tablet Take 1 tablet 3 x /day after meals to prevent seizures  . levothyroxine (SYNTHROID) 100 MCG tablet TAKE 1 TABLET BY MOUTH DAILY  . loratadine (CLARITIN) 10 MG tablet Take 10 mg by mouth daily.  . meclizine (ANTIVERT) 25 MG tablet Take 1 tablet up yo 3 x / day if needed  for Dizziness / Vertigo  . rosuvastatin (CRESTOR) 5 MG tablet Take 1 tablet (5 mg total) by mouth at bedtime.  . saxagliptin HCl (ONGLYZA) 5 MG TABS tablet Take 1 tablet (5 mg total) by mouth daily.  Marland Kitchen terbinafine (LAMISIL) 250 MG tablet Take 1 tablet daily for Toenail Fungus  . vitamin B-12 (CYANOCOBALAMIN) 100 MCG tablet Take 50 mcg by mouth daily.  . vitamin C (ASCORBIC ACID) 500 MG tablet Take 500 mg by mouth daily.  . potassium chloride SA (K-DUR,KLOR-CON) 20 MEQ tablet Take 1 tablet (20 mEq total) by mouth daily. (Patient not taking: Reported on 09/21/2018)   No current facility-administered medications on file prior to visit.    Allergies  Allergen Reactions  . Ppd  [Tuberculin Purified Protein Derivative] Other (See Comments)    indurated   Past Medical History:  Diagnosis Date  . Anemia, unspecified   . Arthritis    "knees; right shoulder" (09/15/2013)  . Basal cell carcinoma    "right upper outer lip"  . DM neuropathy, type II diabetes mellitus ([place])   . Epilepsy ([place])   . GERD (gastroesophageal reflux disease)   . Gout   . High cholesterol   . Hypertension   . Hypothyroidism   . Meniere's disease   . Obesity (BMI 30-39.9)   . Other forms of epilepsy and recurrent seizures without mention of intractable epilepsy 11/04/2012   Non convulsive paroxysmal spells, responding to [facility], patient not driving.   . Pneumonia ~ 1943  . Skin cancer    "forehead; right hand"  . Type II or unspecified type diabetes mellitus without mention of complication, not stated as uncontrolled   . UTI (urinary tract infection)   . Vitamin D deficiency    Health Maintenance  Topic Date Due  . URINE MICROALBUMIN  08/26/2018  . INFLUENZA VACCINE  09/19/2018  . HEMOGLOBIN A1C  12/15/2018  . OPHTHALMOLOGY EXAM  02/24/2019  . FOOT EXAM  09/20/2019  . TETANUS/TDAP  12/09/2027  . DEXA SCAN  Completed  . PNA vac Low Risk Adult  Completed   Immunization History  Administered Date(s) Administered  . DTaP 03/18/2007  . Influenza, High Dose Seasonal PF 10/18/2013, 12/12/2014, 10/24/2015, 10/28/2016, 12/08/2017  . Influenza-Unspecified 11/18/2012  . Pneumococcal Conjugate-13 12/12/2014  . Pneumococcal Polysaccharide-23 09/16/2013  . Td 12/08/2017  . Zoster 02/28/2005   Last Colon - 08/01/2008 - Dr Henrene Pastor - recc 5 yr f/u   Last MGM - 11/19/2017  Past Surgical History:  Procedure Laterality Date  . CERVICAL POLYPECTOMY    . HAMMER TOE SURGERY Left 1980's  . MOHS SURGERY  [address]87   "right upper outer lip"  . TONSILLECTOMY  ~ 1947  . WRIST SURGERY Left ~ 1950   "ran arm thru window"   Family History  Problem Relation Age of Onset  . Heart disease Mother   .  Stroke Mother   . Heart disease Father   . Ovarian cancer Sister   . Hypertension Son   . Hypertension Son   . Diabetes Son   . Alcohol abuse Son    Social History   Tobacco Use  . Smoking status: Former Smoker    Packs/day: 1.00    Years: 30.00    Pack years: 30.00    Types: Cigarettes    Quit date: 06/29/1983    Years since quitting: 35.2  . Smokeless tobacco: Never Used  Substance Use Topics  . Alcohol use: Yes    Alcohol/week: 0.0 standard drinks  Comment: 09/15/2013 "glass of wine maybe once/year"  . Drug use: No    ROS Constitutional: Denies fever, chills, weight loss/gain, headaches, insomnia,  night sweats, and change in appetite. Does c/o fatigue. Eyes: Denies redness, blurred vision, diplopia, discharge, itchy, watery eyes.  ENT: Denies discharge, congestion, post nasal drip, epistaxis, sore throat, earache, hearing loss, dental pain, Tinnitus, Vertigo, Sinus pain, snoring.  Cardio: Denies chest pain, palpitations, irregular heartbeat, syncope, dyspnea, diaphoresis, orthopnea, PND, claudication, edema Respiratory: denies cough, dyspnea, DOE, pleurisy, hoarseness, laryngitis, wheezing.  Gastrointestinal: Denies dysphagia, heartburn, reflux, water brash, pain, cramps, nausea, vomiting, bloating, diarrhea, constipation, hematemesis, melena, hematochezia, jaundice, hemorrhoids Genitourinary: Denies dysuria, frequency, urgency, nocturia, hesitancy, discharge, hematuria, flank pain Breast: Breast lumps, nipple discharge, bleeding.  Musculoskeletal: Denies arthralgia, myalgia, stiffness, Jt. Swelling, pain, limp, and strain/sprain. Denies falls. Skin: Denies puritis, rash, hives, warts, acne, eczema, changing in skin lesion Neuro: No weakness, tremor, incoordination, spasms, paresthesia, pain Psychiatric: Denies confusion, memory loss, sensory loss. Denies Depression. Endocrine: Denies change in weight, skin, hair change, nocturia, and paresthesia, diabetic polys, visual  blurring, hyper / hypo glycemic episodes.  Heme/Lymph: No excessive bleeding, bruising, enlarged lymph nodes.  Physical Exam  BP 124/60   Pulse 76   Temp (!) 97.3 F (36.3 C)   Resp 16   Ht 5\' 2"  (1.575 m)   Wt 205 lb 6.4 oz (93.2 kg)   BMI 37.57 kg/m   General Appearance: Well nourished, well groomed and in no apparent distress.  Eyes: PERRLA, EOMs, conjunctiva no swelling or erythema, normal fundi and vessels. Sinuses: No frontal/maxillary tenderness ENT/Mouth: EACs patent / TMs  nl. Nares clear without erythema, swelling, mucoid exudates. Oral hygiene is good. No erythema, swelling, or exudate. Tongue normal, non-obstructing. Tonsils not swollen or erythematous. Hearing normal.  Neck: Supple, thyroid not palpable. No bruits, nodes or JVD. Respiratory: Respiratory effort normal.  BS equal and clear bilateral without rales, rhonci, wheezing or stridor. Cardio: Heart sounds are normal with regular rate and rhythm and a gr 2 Ao sys M radiating to the Rt carotid.  Peripheral pulses are normal and equal bilaterally without edema. No aortic or femoral bruits. Chest: symmetric with normal excursions and percussion. Breasts: Symmetric, without lumps, nipple discharge, retractions, or fibrocystic changes.  Abdomen: Rotund,  soft with bowel sounds active. Nontender, no guarding, rebound, hernias, masses, or organomegaly.  Lymphatics: Non tender without lymphadenopathy.  Musculoskeletal: Full ROM all peripheral extremities, joint stability, 5/5 strength, and normal gait. Skin: Warm and dry without rashes, lesions, cyanosis, clubbing or  ecchymosis.  Neuro: Cranial nerves intact, reflexes equal bilaterally. Normal muscle tone, no cerebellar symptoms. Sensation decreased to touch, vibratory and Monofilament to the toes bilaterally.  Pysch: Alert and oriented X 3, normal affect, Insight and Judgment appropriate.   Assessment and Plan  1. Annual Preventative Screening Examination  2. Essential  hypertension  - EKG 12-Lead - Urinalysis, Routine w reflex microscopic - CBC with Differential/Platelet - COMPLETE METABOLIC PANEL WITH GFR - Magnesium - TSH  3. Hyperlipidemia, mixed  - EKG 12-Lead - Lipid panel - TSH  4. Type 2 diabetes mellitus with stage 4 chronic kidney disease, with long-term current use of insulin (HCC)  - EKG 12-Lead - Urinalysis, Routine w reflex microscopic - PTH, intact and calcium - Hemoglobin A1c  5. Vitamin D deficiency  - VITAMIN D 25 Hydroxyl  6. Hypothyroidism  - TSH  7. Seizure disorder (HCC)  - Levetiracetam level  8. Type II diabetes mellitus with neurological manifestations ([place])  -  HM DIABETES FOOT EXAM - LOW EXTREMITY NEUR EXAM DOCUM  9. OSA on CPAP   10. Gastroesophageal reflux disease  - CBC with Differential/Platelet  11. Secondary hyperparathyroidism of renal origin ([place])  - PTH, intact and calcium  12. Screening for ischemic heart disease  - EKG 12-Lead  13. FHx: heart disease  - EKG 12-Lead  14. Medication management  - Urinalysis, Routine w reflex microscopic - CBC with Differential/Platelet - COMPLETE METABOLIC PANEL WITH GFR - Magnesium - Lipid panel - TSH - Hemoglobin A1c - VITAMIN D 25 Hydroxyl  15. Screening for colorectal cancer  - POC Hemoccult Bld/         Patient was counseled in prudent diet to achieve/maintain BMI less than 25 for weight control, BP monitoring, regular exercise and medications. Discussed med's effects and SE's. Screening labs and tests as requested with regular follow-up as recommended. Over 40 minutes of exam, counseling, chart review and high complex critical decision making was performed.   Kirtland Bouchard, MD

## 2018-09-20 NOTE — Patient Instructions (Signed)
- Vit D  And Vit C 1,000 mg  are recommended to help protect  against the Covid_19 and other Corona viruses.   - Also it's recommended to take Zinc 50 mg to help  protect against the Covid_19  And best place to get  is also on [employer] and don't pay more than 6-8 cents /pill !   ================================ Coronavirus (COVID-19) Are you at risk?  Are you at risk for the Coronavirus (COVID-19)?  To be considered HIGH RISK for Coronavirus (COVID-19), you have to meet the following criteria:  . Traveled to [place], [place], [place], [place] or [place]; or in the [place] to [place], [place], [place]  . or [place]; and have fever, cough, and shortness of breath within the last 2 weeks of travel OR . Been in close contact with a person diagnosed with COVID-19 within the last 2 weeks and have  . fever, cough,and shortness of breath .  . IF YOU DO NOT MEET THESE CRITERIA, YOU ARE CONSIDERED LOW RISK FOR COVID-19.  What to do if you are HIGH RISK for COVID-19?  Marland Kitchen If you are having a medical emergency, call 911. . Seek medical care right away. Before you go to a doctor's office, urgent care or emergency department, .  call ahead and tell them about your recent travel, contact with someone diagnosed with COVID-19  .  and your symptoms.  . You should receive instructions from your physician's office regarding next steps of care.  . When you arrive at healthcare provider, tell the healthcare staff immediately you have returned from  . visiting [place], [place], [place], [place] or [place]; or traveled in the [place] to [place], [place],  . [place] or [place] in the last two weeks or you have been in close contact with a person diagnosed with  . COVID-19 in the last 2 weeks.   . Tell the health care staff about your symptoms: fever, cough and shortness of breath. . After you have been seen by a medical provider, you will be either: o Tested for (COVID-19) and  discharged home on quarantine except to seek medical care if  o symptoms worsen, and asked to  - Stay home and avoid contact with others until you get your results (4-5 days)  - Avoid travel on public transportation if possible (such as bus, train, or airplane) or o Sent to the Emergency Department by EMS for evaluation, COVID-19 testing  and  o possible admission depending on your condition and test results.  What to do if you are LOW RISK for COVID-19?  Reduce your risk of any infection by using the same precautions used for avoiding the common cold or flu:  Marland Kitchen Wash your hands often with soap and warm water for at least 20 seconds.  If soap and water are not readily available,  . use an alcohol-based hand sanitizer with at least 60% alcohol.  . If coughing or sneezing, cover your mouth and nose by coughing or sneezing into the elbow areas of your shirt or coat, .  into a tissue or into your sleeve (not your hands). . Avoid shaking hands with others and consider head nods or verbal greetings only. . Avoid touching your eyes, nose, or mouth with unwashed hands.  . Avoid close contact with people who are sick. . Avoid places or events with large numbers of people in one location, like concerts or sporting events. . Carefully consider travel plans  you have or are making. . If you are planning any travel outside or inside the [place], visit the CDC's Travelers' Health webpage for the latest health notices. . If you have some symptoms but not all symptoms, continue to monitor at home and seek medical attention  . if your symptoms worsen. . If you are having a medical emergency, call 911.   . >>>>>>>>>>>>>>>>>>>>>>>>>>>>>>>>> . We Do NOT Approve of  [facility] [place] Soliciting Our Patients  To Do Home Visits & We Do NOT Approve of [employer] > > > > > > > > > > > > > > > > > > > > > > > > > > > > > > > > > > > > > > >  Preventive Care for Adults  A healthy lifestyle  and preventive care can promote health and wellness. Preventive health guidelines for women include the following key practices.  A routine yearly physical is a good way to check with your health care provider about your health and preventive screening. It is a chance to share any concerns and updates on your health and to receive a thorough exam.  Visit your dentist for a routine exam and preventive care every 6 months. Brush your teeth twice a day and floss once a day. Good oral hygiene prevents tooth decay and gum disease.  The frequency of eye exams is based on your age, health, family medical history, use of contact lenses, and other factors. Follow your health care provider's recommendations for frequency of eye exams.  Eat a healthy diet. Foods like vegetables, fruits, whole grains, low-fat dairy products, and lean protein foods contain the nutrients you need without too many calories. Decrease your intake of foods high in solid fats, added sugars, and salt. Eat the right amount of calories for you. Get information about a proper diet from your health care provider, if necessary.  Regular physical exercise is one of the most important things you can do for your health. Most adults should get at least 150 minutes of moderate-intensity exercise (any activity that increases your heart rate and causes you to sweat) each week. In addition, most adults need muscle-strengthening exercises on 2 or more days a week.  Maintain a healthy weight. The body mass index (BMI) is a screening tool to identify possible weight problems. It provides an estimate of body fat based on height and weight. Your health care provider can find your BMI and can help you achieve or maintain a healthy weight. For adults 20 years and older:  A BMI below 18.5 is considered underweight.  A BMI of 18.5 to 24.9 is normal.  A BMI of 25 to 29.9 is considered overweight.  A BMI of 30 and above is considered obese.  Maintain  normal blood lipids and cholesterol levels by exercising and minimizing your intake of saturated fat. Eat a balanced diet with plenty of fruit and vegetables. If your lipid or cholesterol levels are high, you are over 50, or you are at high risk for heart disease, you may need your cholesterol levels checked more frequently. Ongoing high lipid and cholesterol levels should be treated with medicines if diet and exercise are not working.  If you smoke, find out from your health care provider how to quit. If you do not use tobacco, do not start.  Lung cancer screening is recommended for adults aged 55-80 years who are at high risk for developing lung cancer because of a history   of smoking. A yearly low-dose CT scan of the lungs is recommended for people who have at least a 30-pack-year history of smoking and are a current smoker or have quit within the past 15 years. A pack year of smoking is smoking an average of 1 pack of cigarettes a day for 1 year (for example: 1 pack a day for 30 years or 2 packs a day for 15 years). Yearly screening should continue until the smoker has stopped smoking for at least 15 years. Yearly screening should be stopped for people who develop a health problem that would prevent them from having lung cancer treatment.  Avoid use of street drugs. Do not share needles with anyone. Ask for help if you need support or instructions about stopping the use of drugs.  High blood pressure causes heart disease and increases the risk of stroke.  Ongoing high blood pressure should be treated with medicines if weight loss and exercise do not work.  If you are 27-58 years old, ask your health care provider if you should take aspirin to prevent strokes.  Diabetes screening involves taking a blood sample to check your fasting blood sugar level. This should be done once every 3 years, after age 73, if you are within normal weight and without risk factors for diabetes. Testing should be considered  at a younger age or be carried out more frequently if you are overweight and have at least 1 risk factor for diabetes.  Breast cancer screening is essential preventive care for women. You should practice "breast self-awareness." This means understanding the normal appearance and feel of your breasts and may include breast self-examination. Any changes detected, no matter how small, should be reported to a health care provider. Women in their 43s and 30s should have a clinical breast exam (CBE) by a health care provider as part of a regular health exam every 1 to 3 years. After age 4, women should have a CBE every year. Starting at age 77, women should consider having a mammogram (breast X-ray test) every year. Women who have a family history of breast cancer should talk to their health care provider about genetic screening. Women at a high risk of breast cancer should talk to their health care providers about having an MRI and a mammogram every year.  Breast cancer gene (BRCA)-related cancer risk assessment is recommended for women who have family members with BRCA-related cancers. BRCA-related cancers include breast, ovarian, tubal, and peritoneal cancers. Having family members with these cancers may be associated with an increased risk for harmful changes (mutations) in the breast cancer genes BRCA1 and BRCA2. Results of the assessment will determine the need for genetic counseling and BRCA1 and BRCA2 testing.  Routine pelvic exams to screen for cancer are no longer recommended for nonpregnant women who are considered low risk for cancer of the pelvic organs (ovaries, uterus, and vagina) and who do not have symptoms. Ask your health care provider if a screening pelvic exam is right for you.  If you have had past treatment for cervical cancer or a condition that could lead to cancer, you need Pap tests and screening for cancer for at least 20 years after your treatment. If Pap tests have been discontinued,  your risk factors (such as having a new sexual partner) need to be reassessed to determine if screening should be resumed. Some women have medical problems that increase the chance of getting cervical cancer. In these cases, your health care provider may recommend more  frequent screening and Pap tests.    Colorectal cancer can be detected and often prevented. Most routine colorectal cancer screening begins at the age of 50 years and continues through age 75 years. However, your health care provider may recommend screening at an earlier age if you have risk factors for colon cancer. On a yearly basis, your health care provider may provide home test kits to check for hidden blood in the stool. Use of a small camera at the end of a tube, to directly examine the colon (sigmoidoscopy or colonoscopy), can detect the earliest forms of colorectal cancer. Talk to your health care provider about this at age 50, when routine screening begins.  Direct exam of the colon should be repeated every 5-10 years through age 75 years, unless early forms of pre-cancerous polyps or small growths are found.  Osteoporosis is a disease in which the bones lose minerals and strength with aging. This can result in serious bone fractures or breaks. The risk of osteoporosis can be identified using a bone density scan. Women ages 65 years and over and women at risk for fractures or osteoporosis should discuss screening with their health care providers. Ask your health care provider whether you should take a calcium supplement or vitamin D to reduce the rate of osteoporosis.  Menopause can be associated with physical symptoms and risks. Hormone replacement therapy is available to decrease symptoms and risks. You should talk to your health care provider about whether hormone replacement therapy is right for you.  Use sunscreen. Apply sunscreen liberally and repeatedly throughout the day. You should seek shade when your shadow is shorter  than you. Protect yourself by wearing long sleeves, pants, a wide-brimmed hat, and sunglasses year round, whenever you are outdoors.  Once a month, do a whole body skin exam, using a mirror to look at the skin on your back. Tell your health care provider of new moles, moles that have irregular borders, moles that are larger than a pencil eraser, or moles that have changed in shape or color.  Stay current with required vaccines (immunizations).  Influenza vaccine. All adults should be immunized every year.  Tetanus, diphtheria, and acellular pertussis (Td, Tdap) vaccine. Pregnant women should receive 1 dose of Tdap vaccine during each pregnancy. The dose should be obtained regardless of the length of time since the last dose. Immunization is preferred during the 27th-36th week of gestation. An adult who has not previously received Tdap or who does not know her vaccine status should receive 1 dose of Tdap. This initial dose should be followed by tetanus and diphtheria toxoids (Td) booster doses every 10 years. Adults with an unknown or incomplete history of completing a 3-dose immunization series with Td-containing vaccines should begin or complete a primary immunization series including a Tdap dose. Adults should receive a Td booster every 10 years.    Zoster vaccine. One dose is recommended for adults aged 60 years or older unless certain conditions are present.    Pneumococcal 13-valent conjugate (PCV13) vaccine. When indicated, a person who is uncertain of her immunization history and has no record of immunization should receive the PCV13 vaccine. An adult aged 19 years or older who has certain medical conditions and has not been previously immunized should receive 1 dose of PCV13 vaccine. This PCV13 should be followed with a dose of pneumococcal polysaccharide (PPSV23) vaccine. The PPSV23 vaccine dose should be obtained at least 1 or more year(s) after the dose of PCV13 vaccine. An adult   aged 24  years or older who has certain medical conditions and previously received 1 or more doses of PPSV23 vaccine should receive 1 dose of PCV13. The PCV13 vaccine dose should be obtained 1 or more years after the last PPSV23 vaccine dose.    Pneumococcal polysaccharide (PPSV23) vaccine. When PCV13 is also indicated, PCV13 should be obtained first. All adults aged 74 years and older should be immunized. An adult younger than age 85 years who has certain medical conditions should be immunized. Any person who resides in a nursing home or long-term care facility should be immunized. An adult smoker should be immunized. People with an immunocompromised condition and certain other conditions should receive both PCV13 and PPSV23 vaccines. People with human immunodeficiency virus (HIV) infection should be immunized as soon as possible after diagnosis. Immunization during chemotherapy or radiation therapy should be avoided. Routine use of PPSV23 vaccine is not recommended for American Indians, [place] Natives, or people younger than 65 years unless there are medical conditions that require PPSV23 vaccine. When indicated, people who have unknown immunization and have no record of immunization should receive PPSV23 vaccine. One-time revaccination 5 years after the first dose of PPSV23 is recommended for people aged 19-64 years who have chronic kidney failure, nephrotic syndrome, asplenia, or immunocompromised conditions. People who received 1-2 doses of PPSV23 before age 10 years should receive another dose of PPSV23 vaccine at age 31 years or later if at least 5 years have passed since the previous dose. Doses of PPSV23 are not needed for people immunized with PPSV23 at or after age 76 years.   Preventive Services / Frequency  Ages 30 years and over  Blood pressure check.  Lipid and cholesterol check.  Lung cancer screening. / Every year if you are aged 72-80 years and have a 30-pack-year history of smoking and  currently smoke or have quit within the past 15 years. Yearly screening is stopped once you have quit smoking for at least 15 years or develop a health problem that would prevent you from having lung cancer treatment.  Clinical breast exam.** / Every year after age 25 years.   BRCA-related cancer risk assessment.** / For women who have family members with a BRCA-related cancer (breast, ovarian, tubal, or peritoneal cancers).  Mammogram.** / Every year beginning at age 29 years and continuing for as long as you are in good health. Consult with your health care provider.  Pap test.** / Every 3 years starting at age 79 years through age 74 or 44 years with 3 consecutive normal Pap tests. Testing can be stopped between 65 and 70 years with 3 consecutive normal Pap tests and no abnormal Pap or HPV tests in the past 10 years.  Fecal occult blood test (FOBT) of stool. / Every year beginning at age 43 years and continuing until age 42 years. You may not need to do this test if you get a colonoscopy every 10 years.  Flexible sigmoidoscopy or colonoscopy.** / Every 5 years for a flexible sigmoidoscopy or every 10 years for a colonoscopy beginning at age 34 years and continuing until age 35 years.  Hepatitis C blood test.** / For all people born from [birth year] through 1963 and any individual with known risks for hepatitis C.  Osteoporosis screening.** / A one-time screening for women ages 58 years and over and women at risk for fractures or osteoporosis.  Skin self-exam. / Monthly.  Influenza vaccine. / Every year.  Tetanus, diphtheria, and acellular pertussis (Tdap/Td) vaccine.** /  1 dose of Td every 10 years.  Zoster vaccine.** / 1 dose for adults aged 60 years or older.  Pneumococcal 13-valent conjugate (PCV13) vaccine.** / Consult your health care provider.  Pneumococcal polysaccharide (PPSV23) vaccine.** / 1 dose for all adults aged 65 years and older. Screening for abdominal aortic aneurysm (AAA)   by ultrasound is recommended for people who have history of high blood pressure or who are current or former smokers. ++++++++++++++++++++ Recommend Adult Low Dose Aspirin or  coated  Aspirin 81 mg daily  To reduce risk of Colon Cancer 40 %,  Skin Cancer 26 % ,  Melanoma 46%  and  Pancreatic cancer 60% ++++++++++++++++++++ Vitamin D goal  is between 70-100.  Please make sure that you are taking your Vitamin D as directed.  It is very important as a natural anti-inflammatory  helping hair, skin, and nails, as well as reducing stroke and heart attack risk.  It helps your bones and helps with mood. It also decreases numerous cancer risks so please take it as directed.  Low Vit D is associated with a 200-300% higher risk for CANCER  and 200-300% higher risk for HEART   ATTACK  &  STROKE.   ...................................... It is also associated with higher death rate at younger ages,  autoimmune diseases like Rheumatoid arthritis, Lupus, Multiple Sclerosis.    Also many other serious conditions, like depression, Alzheimer's Dementia, infertility, muscle aches, fatigue, fibromyalgia - just to name a few. ++++++++++++++++++ Recommend the book "The END of DIETING" by Dr Joel Fuhrman  & the book "The END of DIABETES " by Dr Joel Fuhrman At [employer] - get book & Audio CD's    Being diabetic has a  300% increased risk for heart attack, stroke, cancer, and alzheimer- type vascular dementia. It is very important that you work harder with diet by avoiding all foods that are white. Avoid white rice (brown & wild rice is OK), white potatoes (sweetpotatoes in moderation is OK), White bread or wheat bread or anything made out of white flour like bagels, donuts, rolls, buns, biscuits, cakes, pastries, cookies, pizza crust, and pasta (made from white flour & egg whites) - vegetarian pasta or spinach or wheat pasta is OK. Multigrain breads like Arnold's or [employer], or multigrain sandwich thins  or flatbreads.  Diet, exercise and weight loss can reverse and cure diabetes in the early stages.  Diet, exercise and weight loss is very important in the control and prevention of complications of diabetes which affects every system in your body, ie. Brain - dementia/stroke, eyes - glaucoma/blindness, heart - heart attack/heart failure, kidneys - dialysis, stomach - gastric paralysis, intestines - malabsorption, nerves - severe painful neuritis, circulation - gangrene & loss of a leg(s), and finally cancer and Alzheimers.    I recommend avoid fried & greasy foods,  sweets/candy, white rice (brown or wild rice or Quinoa is OK), white potatoes (sweet potatoes are OK) - anything made from white flour - bagels, doughnuts, rolls, buns, biscuits,white and wheat breads, pizza crust and traditional pasta made of white flour & egg white(vegetarian pasta or spinach or wheat pasta is OK).  Multi-grain bread is OK - like multi-grain flat bread or sandwich thins. Avoid alcohol in excess. Exercise is also important.    Eat all the vegetables you want - avoid meat, especially red meat and dairy - especially cheese.  Cheese is the most concentrated form of trans-fats which is the worst thing to clog up our arteries. Veggie   cheese is OK which can be found in the fresh produce section at [employer] or [employer] or [employer]  +++++++++++++++++++ DASH Eating Plan  DASH stands for "Dietary Approaches to Stop Hypertension."   The DASH eating plan is a healthy eating plan that has been shown to reduce high blood pressure (hypertension). Additional health benefits may include reducing the risk of type 2 diabetes mellitus, heart disease, and stroke. The DASH eating plan may also help with weight loss. WHAT DO I NEED TO KNOW ABOUT THE DASH EATING PLAN? For the DASH eating plan, you will follow these general guidelines:  Choose foods with a percent daily value for sodium of less than 5% (as listed on the food  label).  Use salt-free seasonings or herbs instead of table salt or sea salt.  Check with your health care provider or pharmacist before using salt substitutes.  Eat lower-sodium products, often labeled as "lower sodium" or "no salt added."  Eat fresh foods.  Eat more vegetables, fruits, and low-fat dairy products.  Choose whole grains. Look for the word "whole" as the first word in the ingredient list.  Choose fish   Limit sweets, desserts, sugars, and sugary drinks.  Choose heart-healthy fats.  Eat veggie cheese   Eat more home-cooked food and less restaurant, buffet, and fast food.  Limit fried foods.  Cook foods using methods other than frying.  Limit canned vegetables. If you do use them, rinse them well to decrease the sodium.  When eating at a restaurant, ask that your food be prepared with less salt, or no salt if possible.                      WHAT FOODS CAN I EAT? Read Dr Fara Olden Fuhrman's books on The End of Dieting & The End of Diabetes  Grains Whole grain or whole wheat bread. Brown rice. Whole grain or whole wheat pasta. Quinoa, bulgur, and whole grain cereals. Low-sodium cereals. Corn or whole wheat flour tortillas. Whole grain cornbread. Whole grain crackers. Low-sodium crackers.  Vegetables Fresh or frozen vegetables (raw, steamed, roasted, or grilled). Low-sodium or reduced-sodium tomato and vegetable juices. Low-sodium or reduced-sodium tomato sauce and paste. Low-sodium or reduced-sodium canned vegetables.   Fruits All fresh, canned (in natural juice), or frozen fruits.  Protein Products  All fish and seafood.  Dried beans, peas, or lentils. Unsalted nuts and seeds. Unsalted canned beans.  Dairy Low-fat dairy products, such as skim or 1% milk, 2% or reduced-fat cheeses, low-fat ricotta or cottage cheese, or plain low-fat yogurt. Low-sodium or reduced-sodium cheeses.  Fats and Oils Tub margarines without trans fats. Light or reduced-fat mayonnaise  and salad dressings (reduced sodium). Avocado. Safflower, olive, or canola oils. Natural peanut or almond butter.  Other Unsalted popcorn and pretzels. The items listed above may not be a complete list of recommended foods or beverages. Contact your dietitian for more options.  +++++++++++++++  WHAT FOODS ARE NOT RECOMMENDED? Grains/ White flour or wheat flour White bread. White pasta. White rice. Refined cornbread. Bagels and croissants. Crackers that contain trans fat.  Vegetables  Creamed or fried vegetables. Vegetables in a . Regular canned vegetables. Regular canned tomato sauce and paste. Regular tomato and vegetable juices.  Fruits Dried fruits. Canned fruit in light or heavy syrup. Fruit juice.  Meat and Other Protein Products Meat in general - RED meat & White meat.  Fatty cuts of meat. Ribs, chicken wings, all processed meats as bacon, sausage, bologna, salami,  fatback, hot dogs, bratwurst and packaged luncheon meats.  Dairy Whole or 2% milk, cream, half-and-half, and cream cheese. Whole-fat or sweetened yogurt. Full-fat cheeses or blue cheese. Non-dairy creamers and whipped toppings. Processed cheese, cheese spreads, or cheese curds.  Condiments Onion and garlic salt, seasoned salt, table salt, and sea salt. Canned and packaged gravies. Worcestershire sauce. Tartar sauce. Barbecue sauce. Teriyaki sauce. Soy sauce, including reduced sodium. Steak sauce. Fish sauce. Oyster sauce. Cocktail sauce. Horseradish. Ketchup and mustard. Meat flavorings and tenderizers. Bouillon cubes. Hot sauce. Tabasco sauce. Marinades. Taco seasonings. Relishes.  Fats and Oils Butter, stick margarine, lard, shortening and bacon fat. Coconut, palm kernel, or palm oils. Regular salad dressings.  Pickles and olives. Salted popcorn and pretzels.  The items listed above may not be a complete list of foods and beverages to avoid.    

## 2018-09-21 ENCOUNTER — Ambulatory Visit (INDEPENDENT_AMBULATORY_CARE_PROVIDER_SITE_OTHER): Payer: Medicare Other | Admitting: Internal Medicine

## 2018-09-21 ENCOUNTER — Other Ambulatory Visit: Payer: Self-pay

## 2018-09-21 VITALS — BP 124/60 | HR 76 | Temp 97.3°F | Resp 16 | Ht 62.0 in | Wt 205.4 lb

## 2018-09-21 DIAGNOSIS — E782 Mixed hyperlipidemia: Secondary | ICD-10-CM

## 2018-09-21 DIAGNOSIS — Z8249 Family history of ischemic heart disease and other diseases of the circulatory system: Secondary | ICD-10-CM

## 2018-09-21 DIAGNOSIS — E559 Vitamin D deficiency, unspecified: Secondary | ICD-10-CM

## 2018-09-21 DIAGNOSIS — Z9989 Dependence on other enabling machines and devices: Secondary | ICD-10-CM

## 2018-09-21 DIAGNOSIS — Z0001 Encounter for general adult medical examination with abnormal findings: Secondary | ICD-10-CM

## 2018-09-21 DIAGNOSIS — Z79899 Other long term (current) drug therapy: Secondary | ICD-10-CM

## 2018-09-21 DIAGNOSIS — G2581 Restless legs syndrome: Secondary | ICD-10-CM

## 2018-09-21 DIAGNOSIS — E039 Hypothyroidism, unspecified: Secondary | ICD-10-CM

## 2018-09-21 DIAGNOSIS — K219 Gastro-esophageal reflux disease without esophagitis: Secondary | ICD-10-CM

## 2018-09-21 DIAGNOSIS — Z136 Encounter for screening for cardiovascular disorders: Secondary | ICD-10-CM

## 2018-09-21 DIAGNOSIS — G40909 Epilepsy, unspecified, not intractable, without status epilepticus: Secondary | ICD-10-CM

## 2018-09-21 DIAGNOSIS — Z1212 Encounter for screening for malignant neoplasm of rectum: Secondary | ICD-10-CM

## 2018-09-21 DIAGNOSIS — Z1211 Encounter for screening for malignant neoplasm of colon: Secondary | ICD-10-CM

## 2018-09-21 DIAGNOSIS — E1149 Type 2 diabetes mellitus with other diabetic neurological complication: Secondary | ICD-10-CM | POA: Diagnosis not present

## 2018-09-21 DIAGNOSIS — N2581 Secondary hyperparathyroidism of renal origin: Secondary | ICD-10-CM

## 2018-09-21 DIAGNOSIS — G4733 Obstructive sleep apnea (adult) (pediatric): Secondary | ICD-10-CM

## 2018-09-21 DIAGNOSIS — I10 Essential (primary) hypertension: Secondary | ICD-10-CM

## 2018-09-21 DIAGNOSIS — IMO0001 Reserved for inherently not codable concepts without codable children: Secondary | ICD-10-CM

## 2018-09-21 DIAGNOSIS — E1122 Type 2 diabetes mellitus with diabetic chronic kidney disease: Secondary | ICD-10-CM

## 2018-09-21 MED ORDER — ROPINIROLE HCL 1 MG PO TABS: ORAL_TABLET | ORAL | 0 refills | Status: DC

## 2018-09-24 LAB — CBC WITH DIFFERENTIAL/PLATELET
Absolute Monocytes: 502 cells/uL (ref 200–950)
Basophils Absolute: 31 cells/uL (ref 0–200)
Basophils Relative: 0.5 %
Eosinophils Absolute: 756 cells/uL — ABNORMAL HIGH (ref 15–500)
Eosinophils Relative: 12.2 %
HCT: 39.3 % (ref 35.0–45.0)
Hemoglobin: 12.3 g/dL (ref 11.7–15.5)
Lymphs Abs: 818 cells/uL — ABNORMAL LOW (ref 850–3900)
MCH: 28.4 pg (ref 27.0–33.0)
MCHC: 31.3 g/dL — ABNORMAL LOW (ref 32.0–36.0)
MCV: 90.8 fL (ref 80.0–100.0)
MPV: 10.2 fL (ref 7.5–12.5)
Monocytes Relative: 8.1 %
Neutro Abs: 4092 cells/uL (ref 1500–7800)
Neutrophils Relative %: 66 %
Platelets: 359 10*3/uL (ref 140–400)
RBC: 4.33 10*6/uL (ref 3.80–5.10)
RDW: 17.7 % — ABNORMAL HIGH (ref 11.0–15.0)
Total Lymphocyte: 13.2 %
WBC: 6.2 10*3/uL (ref 3.8–10.8)

## 2018-09-24 LAB — COMPLETE METABOLIC PANEL WITH GFR
AG Ratio: 1.8 (calc) (ref 1.0–2.5)
ALT: 13 U/L (ref 6–29)
AST: 15 U/L (ref 10–35)
Albumin: 4.8 g/dL (ref 3.6–5.1)
Alkaline phosphatase (APISO): 53 U/L (ref 37–153)
BUN/Creatinine Ratio: 18 (calc) (ref 6–22)
BUN: 38 mg/dL — ABNORMAL HIGH (ref 7–25)
CO2: 27 mmol/L (ref 20–32)
Calcium: 10.2 mg/dL (ref 8.6–10.4)
Chloride: 107 mmol/L (ref 98–110)
Creat: 2.08 mg/dL — ABNORMAL HIGH (ref 0.60–0.88)
GFR, Est African American: 25 mL/min/{1.73_m2} — ABNORMAL LOW (ref >=60)
GFR, Est Non African American: 21 mL/min/{1.73_m2} — ABNORMAL LOW (ref >=60)
Globulin: 2.6 g/dL (calc) (ref 1.9–3.7)
Glucose, Bld: 168 mg/dL — ABNORMAL HIGH (ref 65–99)
Potassium: 4.6 mmol/L (ref 3.5–5.3)
Sodium: 142 mmol/L (ref 135–146)
Total Bilirubin: 0.3 mg/dL (ref 0.2–1.2)
Total Protein: 7.4 g/dL (ref 6.1–8.1)

## 2018-09-24 LAB — MAGNESIUM: Magnesium: 2.1 mg/dL (ref 1.5–2.5)

## 2018-09-24 LAB — LIPID PANEL
Cholesterol: 94 mg/dL (ref <200)
HDL: 27 mg/dL — ABNORMAL LOW (ref >=50)
LDL Cholesterol (Calc): 43 mg/dL (calc)
Non-HDL Cholesterol (Calc): 67 mg/dL (calc) (ref <130)
Total CHOL/HDL Ratio: 3.5 (calc) (ref <5.0)
Triglycerides: 165 mg/dL — ABNORMAL HIGH (ref <150)

## 2018-09-24 LAB — HEMOGLOBIN A1C
Hgb A1c MFr Bld: 6.9 % of total Hgb — ABNORMAL HIGH (ref <5.7)
Mean Plasma Glucose: 151 (calc)
eAG (mmol/L): 8.4 (calc)

## 2018-09-24 LAB — URINALYSIS, ROUTINE W REFLEX MICROSCOPIC
Bilirubin Urine: NEGATIVE
Glucose, UA: NEGATIVE
Hgb urine dipstick: NEGATIVE
Ketones, ur: NEGATIVE
Leukocytes,Ua: NEGATIVE
Nitrite: NEGATIVE
Protein, ur: NEGATIVE
Specific Gravity, Urine: 1.01 (ref 1.001–1.03)
pH: <=5 (ref 5.0–8.0)

## 2018-09-24 LAB — VITAMIN D 25 HYDROXY (VIT D DEFICIENCY, FRACTURES): Vit D, 25-Hydroxy: 80 ng/mL (ref 30–100)

## 2018-09-24 LAB — PTH, INTACT AND CALCIUM
Calcium: 10.2 mg/dL (ref 8.6–10.4)
PTH: 77 pg/mL — ABNORMAL HIGH (ref 14–64)

## 2018-09-24 LAB — TSH: TSH: 0.75 mIU/L (ref 0.40–4.50)

## 2018-09-24 LAB — LEVETIRACETAM LEVEL: Keppra (Levetiracetam): 40.8 ug/mL (ref 12.0–46.0)

## 2018-10-07 ENCOUNTER — Other Ambulatory Visit: Payer: Self-pay | Admitting: Internal Medicine

## 2018-11-09 ENCOUNTER — Other Ambulatory Visit: Payer: Self-pay | Admitting: Internal Medicine

## 2018-11-09 DIAGNOSIS — G2581 Restless legs syndrome: Secondary | ICD-10-CM

## 2018-11-09 MED ORDER — ROPINIROLE HCL 1 MG PO TABS: ORAL_TABLET | ORAL | 3 refills | Status: DC

## 2018-11-25 ENCOUNTER — Other Ambulatory Visit: Payer: Self-pay

## 2018-11-25 DIAGNOSIS — Z1211 Encounter for screening for malignant neoplasm of colon: Secondary | ICD-10-CM

## 2018-11-25 LAB — POC HEMOCCULT BLD/STL (HOME/3-CARD/SCREEN)
Card #2 Fecal Occult Blod, POC: NEGATIVE
Card #3 Fecal Occult Blood, POC: NEGATIVE
Fecal Occult Blood, POC: NEGATIVE

## 2018-11-26 DIAGNOSIS — Z1211 Encounter for screening for malignant neoplasm of colon: Secondary | ICD-10-CM

## 2018-12-02 ENCOUNTER — Other Ambulatory Visit: Payer: Self-pay | Admitting: Physician Assistant

## 2018-12-02 ENCOUNTER — Other Ambulatory Visit: Payer: Self-pay | Admitting: Internal Medicine

## 2018-12-02 DIAGNOSIS — E1169 Type 2 diabetes mellitus with other specified complication: Secondary | ICD-10-CM

## 2018-12-17 ENCOUNTER — Other Ambulatory Visit: Payer: Self-pay

## 2018-12-17 ENCOUNTER — Ambulatory Visit: Payer: Medicare Other | Admitting: Adult Health

## 2018-12-17 ENCOUNTER — Encounter: Payer: Self-pay | Admitting: Adult Health

## 2018-12-17 VITALS — BP 140/62 | HR 80 | Temp 98.0°F | Ht 62.0 in | Wt 207.4 lb

## 2018-12-17 DIAGNOSIS — R569 Unspecified convulsions: Secondary | ICD-10-CM | POA: Diagnosis not present

## 2018-12-17 NOTE — Progress Notes (Signed)
MEDICARE ANNUAL WELLNESS VISIT AND FOLLOW UP  THIS ENCOUNTER IS A VIRTUAL/TELEPHONE VISIT DUE TO COVID-19 - PATIENT WAS NOT SEEN IN THE OFFICE.  PATIENT HAS CONSENTED TO VIRTUAL VISIT / TELEMEDICINE VISIT  This provider placed a call to [name] using telephone, her appointment was changed to a virtual office visit to reduce the risk of exposure to the COVID-19 virus and to help [name] [name] remain healthy and safe. The virtual visit will also provide continuity of care. She verbalizes understanding.   Assessment:   Secondary hyperparathyroidism monitor  Essential hypertension - continue medications, DASH diet, exercise and monitor at home. Call if greater than 130/80.   OSA on CPAP Continue CPAP  CKD stage 4 due to type 2 diabetes mellitus (HCC) Increase fluids, avoid NSAIDS, monitor sugars, will monitor  Type II diabetes mellitus with neurological manifestations [facility]) Discussed general issues about diabetes pathophysiology and management., Educational material distributed., Suggested low cholesterol diet., Encouraged aerobic exercise., Discussed foot care. Increase by 2 units in the AM if sugars over 150 Increase exercise, likely increased due to lack of activity with COVID Keep food and sugar log  Hypothyroidism, unspecified type Hypothyroidism-check TSH level, continue medications the same, reminded to take on an empty stomach 30-66mins before food.   Seizure disorder ([place]) Continue meds  Type 2 diabetes mellitus with hyperlipidemia (HCC)  Insulin dependent diabetes mellitus ([place]) Discussed general issues about diabetes pathophysiology and management., Educational material distributed., Suggested low cholesterol diet., Encouraged aerobic exercise., Discussed foot care.  Hyperlipidemia check lipids- do crestor every other day since difficult to cut decrease fatty foods increase activity.  GOAL IS 70  Morbid obesity (BMI 39.86)  (HCC) - follow up 3 months  for progress monitoring - increase veggies, decrease carbs - long discussion about weight loss, diet, and exercise  Iron deficiency anemia, unspecified iron deficiency anemia type -     Due to CKD  Venous (peripheral) insufficiency - weight loss discussed, start compression stockings and elevation  Benign neoplasm of colon, unspecified part of colon Monitor  Gastroesophageal reflux disease, esophagitis presence not specified Continue PPI/H2 blocker, diet discussed  Vitamin D deficiency Continue supplement  ACE inhibitor intolerance Due to CKD  Medication management  Encounter for Medicare annual wellness exam 1 year  Over 30 minutes of exam, counseling, chart review, and critical decision making was performed Future Appointments  Date Time Provider [place]  01/01/2019 11:15 AM [name], NP [facility] None  04/05/2019 10:30 AM Unk Pinto, MD [facility] None  11/01/2019 10:00 AM Unk Pinto, MD [facility] None  12/20/2019 10:30 AM Ward Givens, NP [facility] None    Plan:   During the course of the visit the patient was educated and counseled about appropriate screening and preventive services including:    Pneumococcal vaccine   Influenza vaccine  Td vaccine  Prevnar 13  Screening electrocardiogram  Screening mammography  Bone densitometry screening  Colorectal cancer screening  Diabetes screening  Glaucoma screening  Nutrition counseling   Advanced directives: given info/requested copies.    Subjective:   [name] [name] is a 85 y.o. female who presents for Medicare Annual Wellness Visit and 3 month follow up on hypertension, prediabetes, hyperlipidemia, vitamin D def.   Her blood pressure has been controlled at home, today their BP is  .   Son drives her, she has not driven in several years. She lives at [place] [facility], has [employer] certified [employer], and has house so she gets out in her garden.  She does workout. She denies chest  pain, shortness of breath, dizziness.   Has been following with derm, skin surgery center, history of [place] She is following with ortho for left knee pain.  Follows with neuro for seizures, Dr. Clabe Seal, she is on Keppra.   She has been working on diet and exercise for diabetes  with stage 4 CKD NOT on ACE due to CKD Hyperlipidemia on crestor 5 mg, has been take 1/2 pill but states that it is hard to cut, at goal she is on bASA  she is on 28 units of humulin in the morning and 25 at night  Sugars have been running 130-150 in the morning, she had a 104 at night on a Saturday but no other low sugars.  Eye Doctor Dr. Ellie Lunch denies foot ulcerations, hyperglycemia, hypoglycemia , increased appetite, nausea, paresthesia of the feet, polydipsia, polyuria, visual disturbances, vomiting and weight loss.  Last A1C in the office was:  Lab Results  Component Value Date   HGBA1C 6.9 (H) 09/21/2018   Last GFR Lab Results  Component Value Date   GFRAA 25 (L) 09/21/2018   Lab Results  Component Value Date   CHOL 94 09/21/2018   HDL 27 (L) 09/21/2018   LDLCALC 43 09/21/2018   TRIG 165 (H) 09/21/2018   CHOLHDL 3.5 09/21/2018    Patient is on Vitamin D supplement. Lab Results  Component Value Date   VD25OH 80 09/21/2018     BMI is There is no height or weight on file to calculate BMI., she is working on diet and exercise. Wt Readings from Last 3 Encounters:  12/17/18 207 lb 6.4 oz (94.1 kg)  09/21/18 205 lb 6.4 oz (93.2 kg)  06/15/18 212 lb 9.6 oz (96.4 kg)   She is on thyroid medication. Her medication was not changed last visit.   Lab Results  Component Value Date   TSH 0.75 09/21/2018  .   Medication Review  Current Outpatient Medications (Endocrine & Metabolic):  .  insulin NPH-regular Human (NOVOLIN 70/30) (70-30) 100 UNIT/ML injection, Takes 28 units qam and 26 units qpm (Patient taking differently: Takes 28 units qam and 25 units qpm) .  levothyroxine (SYNTHROID) 100 MCG  tablet, TAKE 1 TABLET BY MOUTH DAILY .  saxagliptin HCl (ONGLYZA) 5 MG TABS tablet, Take 1 tablet Daily for Diabetes  Current Outpatient Medications (Cardiovascular):  .  atenolol (TENORMIN) 50 MG tablet, TAKE 1 TABLET BY MOUTH DAILY .  furosemide (LASIX) 40 MG tablet, TAKE 1 TABLET BY MOUTH EVERY DAY (Patient taking differently: Take 40 mg by mouth as needed. ) .  rosuvastatin (CRESTOR) 5 MG tablet, Take 1 tablet Daily for Cholesterol  Current Outpatient Medications (Respiratory):  .  loratadine (CLARITIN) 10 MG tablet, Take 10 mg by mouth daily.  Current Outpatient Medications (Analgesics):  .  acetaminophen (TYLENOL) 500 MG tablet, Take 1,000 mg by mouth every 6 (six) hours as needed for moderate pain. Marland Kitchen  aspirin 81 MG tablet, Take 81 mg by mouth daily.  Current Outpatient Medications (Hematological):  .  ferrous sulfate dried (SLOW FE) 160 (50 FE) MG TBCR, Take 160 mg by mouth 2 (two) times daily.  .  vitamin B-12 (CYANOCOBALAMIN) 100 MCG tablet, Take 500 mcg by mouth daily.   Current Outpatient Medications (Other):  .  fish oil-omega-3 fatty acids 1000 MG capsule, Take 2 g by mouth at bedtime.  .  Flaxseed, Linseed, (FLAX SEED OIL) 1000 MG CAPS, Take 1 capsule by mouth 3 (three)  times daily. Marland Kitchen  glucose blood (FREESTYLE INSULINX TEST) test strip, Check glucose 3 x/day before meals .  Lancets (FREESTYLE) lancets, Use to check glucose 3  x/day .  lansoprazole (PREVACID) 30 MG capsule, Take 1 capsule Daily for Indigestion & Heartburn .  levETIRAcetam (KEPPRA) 500 MG tablet, Take 1 tablet 3 x /day after meals to prevent seizures .  meclizine (ANTIVERT) 25 MG tablet, Take 1 tablet up yo 3 x / day if needed for Dizziness / Vertigo .  potassium chloride SA (K-DUR,KLOR-CON) 20 MEQ tablet, Take 1 tablet (20 mEq total) by mouth daily. Marland Kitchen  rOPINIRole (REQUIP) 1 MG tablet, Take 1/2 to 1 tablet 3 x /day as needed for Restless Legs & Cramps .  terbinafine (LAMISIL) 250 MG tablet, Take 1 tablet  daily for Toenail Fungus .  vitamin C (ASCORBIC ACID) 500 MG tablet, Take 1,000 mg by mouth 3 (three) times daily.   Current Problems (verified) Patient Active Problem List   Diagnosis Date Noted  . Secondary hyperparathyroidism of renal origin ([place]) 09/20/2018  . Enrolled in chronic care management 05/19/2018  . Type 2 diabetes mellitus with stage 4 chronic kidney disease, with long-term current use of insulin ([place]) 12/08/2017  . Insulin dependent type 2 diabetes mellitus, controlled ([place]) 12/08/2017  . ACE inhibitor intolerance 12/08/2017  . Vertigo of central origin 11/20/2015  . Morbid obesity (BMI 39.86)  ([place]) 12/12/2014  . OSA on CPAP 08/08/2014  . Type II diabetes mellitus with neurological manifestations ([place]) 09/15/2013  . Hyperlipidemia, mixed 06/27/2013  . Vitamin D deficiency 06/27/2013  . Seizure disorder ([place]) 11/04/2012  . Iron deficiency anemia 06/13/2008  . COLONIC POLYPS 06/09/2008  . Hypothyroidism 06/09/2008  . CKD stage 4 due to type 2 diabetes mellitus ([place]) 06/09/2008  . Venous (peripheral) insufficiency 06/09/2008  . Gastroesophageal reflux disease 06/09/2008  . Essential hypertension 06/09/2008    Screening Tests Immunization History  Administered Date(s) Administered  . DTaP 03/18/2007  . Influenza, High Dose Seasonal PF 10/18/2013, 12/12/2014, 10/24/2015, 10/28/2016, 12/08/2017, 12/01/2018  . Influenza-Unspecified 11/18/2012  . Pneumococcal Conjugate-13 12/12/2014  . Pneumococcal Polysaccharide-23 09/16/2013  . Td 12/08/2017  . Zoster 02/28/2005    Preventative care: Last colonoscopy: 2010 no follow up due to age Last mammogram: 11/2017  DEXA: 2017 Echo 2015 Sleep study 2016  Prior vaccinations: TD: 2019  Influenza: 2020  Pneumococcal: 2015 Prevnar13: 2016 Shingles/Zostavax: 2007  Names of Other Physician/Practitioners you currently use: 1. [facility]- here for primary care 2. Dr. Ellie Lunch, eye  doctor, last visit 2020 3. Dr. Lovena Le, dentist, last visit 2017  Patient Care Team: Unk Pinto, MD as PCP - General (Internal Medicine) Rozetta Nunnery, MD as Consulting Physician (Otolaryngology) Teena Irani, MD (Inactive) as Consulting Physician (Gastroenterology) Marcy Panning, MD as Consulting Physician (Oncology) Claudia Desanctis, MD as Consulting Physician (Internal Medicine)  Allergies Allergies  Allergen Reactions  . Ppd [Tuberculin Purified Protein Derivative] Other (See Comments)    indurated    SURGICAL HISTORY She  has a past surgical history that includes Tonsillectomy (~ 1947); Wrist surgery (Left, ~ 1950); Cervical polypectomy; Hammer toe surgery (Left, 1980's); and Mohs surgery (20087). FAMILY HISTORY Her family history includes Alcohol abuse in her son; Diabetes in her son; Heart disease in her father and mother; Hypertension in her son and son; Ovarian cancer in her sister; Stroke in her mother. SOCIAL HISTORY She  reports that she quit smoking about 35 years ago. Her smoking use included cigarettes. She has  a 30.00 pack-year smoking history. She has never used smokeless tobacco. She reports current alcohol use. She reports that she does not use drugs.  MEDICARE WELLNESS OBJECTIVES: Physical activity: Current Exercise Habits: The patient does not participate in regular exercise at present Cardiac risk factors: Cardiac Risk Factors include: advanced age (>60men, >64 women);diabetes mellitus;dyslipidemia;hypertension;obesity (BMI >30kg/m2);sedentary lifestyle Depression/mood screen:   Depression screen [facility] 2/9 12/18/2018  Decreased Interest 0  Down, Depressed, Hopeless 0  PHQ - 2 Score 0    ADLs:  In your present state of health, do you have any difficulty performing the following activities: 12/18/2018 09/20/2018  Hearing? N N  Comment has new hearing aids -  Vision? N N  Difficulty concentrating or making decisions? N N  Walking or climbing stairs? Y N   Dressing or bathing? N N  Doing errands, shopping? Y N  Comment does not drive -  Some recent data might be hidden     Cognitive Testing  Alert? Yes  Normal Appearance?Yes  Oriented to person? Yes  Place? Yes   Time? Yes  Recall of three objects?  2/3  Can perform simple calculations? Yes  Displays appropriate judgment?Yes  Can read the correct time from a watch face?Yes  EOL planning: Does Patient Have a Medical Advance Directive?: No Would patient like information on creating a medical advance directive?: No - Patient declined(has papers at home)   Objective:   There were no vitals filed for this visit. There is no height or weight on file to calculate BMI.  General Appearance:Well sounding, in no apparent distress.  ENT/Mouth: No hoarseness, No cough for duration of visit.  Respiratory: completing full sentences without distress, without audible wheeze Neuro: Awake and oriented X 3,  Psych:  Insight and Judgment appropriate.    Medicare Attestation I have personally reviewed: The patient's medical and social history Their use of alcohol, tobacco or illicit drugs Their current medications and supplements The patient's functional ability including ADLs,fall risks, home safety risks, cognitive, and hearing and visual impairment Diet and physical activities Evidence for depression or mood disorders  The patient's weight, height, BMI, and visual acuity have been recorded in the chart.  I have made referrals, counseling, and provided education to the patient based on review of the above and I have provided the patient with a written personalized care plan for preventive services.     Vicie Mutters, PA-C   12/18/2018

## 2018-12-17 NOTE — Progress Notes (Signed)
PATIENT: [name] [name] DOB: 1932/04/01  REASON FOR VISIT: follow up HISTORY FROM: patient  HISTORY OF PRESENT ILLNESS: Today 12/17/18:  [name] [name] is an 85 year old female with a history of seizures.  She returns today for follow-up.  She remains on Keppra 500 mg 3 times a day.  She denies any seizure events.  She continues to live at [facility].  Denies any changes in her gait or balance.  Denies any changes with her mood or behavior.  Denies any new symptoms.  She returns today for an evaluation.  HISTORY 12/15/17:  [name] [name] is an 85 year old female with a history of seizures.  She returns today for follow-up.  She denies any seizure events.  She continues on Keppra 500 mg 3 times a day.  Denies any significant changes with her mood or behavior.  No changes in her gait or balance.  She continues to reside at [facility].  She reports that on September 29 she had a episode of dizziness as well as syncope.  She had a RN onsite come check on her but she did go to the hospital.  She was diagnosed with vertigo and was told to do the exercises that she learned from vestibular rehab.  He has not had any additional events.  She returns today for an evaluation.  REVIEW OF SYSTEMS: Out of a complete 14 system review of symptoms, the patient complains only of the following symptoms, and all other reviewed systems are negative.  See HPI  ALLERGIES: Allergies  Allergen Reactions  . Ppd [Tuberculin Purified Protein Derivative] Other (See Comments)    indurated    HOME MEDICATIONS: Outpatient Medications Prior to Visit  Medication Sig Dispense Refill  . acetaminophen (TYLENOL) 500 MG tablet Take 1,000 mg by mouth every 6 (six) hours as needed for moderate pain.    Marland Kitchen aspirin 81 MG tablet Take 81 mg by mouth daily.    Marland Kitchen atenolol (TENORMIN) 50 MG tablet TAKE 1 TABLET BY MOUTH DAILY 90 tablet 1  . ferrous sulfate dried (SLOW FE) 160 (50 FE) MG TBCR Take 160 mg by mouth 2 (two) times daily.      . fish oil-omega-3 fatty acids 1000 MG capsule Take 2 g by mouth at bedtime.     . Flaxseed, Linseed, (FLAX SEED OIL) 1000 MG CAPS Take 1 capsule by mouth 3 (three) times daily.    . furosemide (LASIX) 40 MG tablet TAKE 1 TABLET BY MOUTH EVERY DAY (Patient taking differently: Take 40 mg by mouth as needed. ) 90 tablet 1  . glucose blood (FREESTYLE INSULINX TEST) test strip Check glucose 3 x/day before meals 300 each 3  . insulin NPH-regular Human (NOVOLIN 70/30) (70-30) 100 UNIT/ML injection Takes 28 units qam and 26 units qpm (Patient taking differently: Takes 28 units qam and 25 units qpm) 10 mL 3  . Lancets (FREESTYLE) lancets Use to check glucose 3  x/day 300 each 1  . lansoprazole (PREVACID) 30 MG capsule Take 1 capsule Daily for Indigestion & Heartburn 90 capsule 3  . levETIRAcetam (KEPPRA) 500 MG tablet Take 1 tablet 3 x /day after meals to prevent seizures 270 tablet 3  . levothyroxine (SYNTHROID) 100 MCG tablet TAKE 1 TABLET BY MOUTH DAILY 90 tablet 3  . loratadine (CLARITIN) 10 MG tablet Take 10 mg by mouth daily.    . meclizine (ANTIVERT) 25 MG tablet Take 1 tablet up yo 3 x / day if needed for Dizziness / Vertigo 270 tablet 1  .  potassium chloride SA (K-DUR,KLOR-CON) 20 MEQ tablet Take 1 tablet (20 mEq total) by mouth daily. 30 tablet 0  . rOPINIRole (REQUIP) 1 MG tablet Take 1/2 to 1 tablet 3 x /day as needed for Restless Legs & Cramps 90 tablet 3  . rosuvastatin (CRESTOR) 5 MG tablet Take 1 tablet Daily for Cholesterol 90 tablet 3  . saxagliptin HCl (ONGLYZA) 5 MG TABS tablet Take 1 tablet Daily for Diabetes 90 tablet 3  . terbinafine (LAMISIL) 250 MG tablet Take 1 tablet daily for Toenail Fungus 90 tablet 1  . vitamin B-12 (CYANOCOBALAMIN) 100 MCG tablet Take 50 mcg by mouth daily.    . vitamin C (ASCORBIC ACID) 500 MG tablet Take 500 mg by mouth daily.     No facility-administered medications prior to visit.     PAST MEDICAL HISTORY: Past Medical History:  Diagnosis Date  .  Anemia, unspecified   . Arthritis    "knees; right shoulder" (09/15/2013)  . Basal cell carcinoma    "right upper outer lip"  . DM neuropathy, type II diabetes mellitus ([place])   . Epilepsy ([place])   . GERD (gastroesophageal reflux disease)   . Gout   . High cholesterol   . Hypertension   . Hypothyroidism   . Meniere's disease   . Obesity (BMI 30-39.9)   . Other forms of epilepsy and recurrent seizures without mention of intractable epilepsy 11/04/2012   Non convulsive paroxysmal spells, responding to [facility], patient not driving.   . Pneumonia ~ 1943  . Skin cancer    "forehead; right hand"  . Type II or unspecified type diabetes mellitus without mention of complication, not stated as uncontrolled   . UTI (urinary tract infection)   . Vitamin D deficiency     PAST SURGICAL HISTORY: Past Surgical History:  Procedure Laterality Date  . CERVICAL POLYPECTOMY    . HAMMER TOE SURGERY Left 1980's  . MOHS SURGERY  [address]87   "right upper outer lip"  . TONSILLECTOMY  ~ 1947  . WRIST SURGERY Left ~ 1950   "ran arm thru window"    FAMILY HISTORY: Family History  Problem Relation Age of Onset  . Heart disease Mother   . Stroke Mother   . Heart disease Father   . Ovarian cancer Sister   . Hypertension Son   . Hypertension Son   . Diabetes Son   . Alcohol abuse Son     SOCIAL HISTORY: Social History   Socioeconomic History  . Marital status: Widowed    Spouse name: Not on file  . Number of children: 3  . Years of education: 44  . Highest education level: Not on file  Occupational History  . Occupation: retired  Scientific laboratory technician  . Financial resource strain: Not on file  . Food insecurity    Worry: Not on file    Inability: Not on file  . Transportation needs    Medical: Not on file    Non-medical: Not on file  Tobacco Use  . Smoking status: Former Smoker    Packs/day: 1.00    Years: 30.00    Pack years: 30.00    Types: Cigarettes    Quit date: 06/29/1983    Years since  quitting: 35.4  . Smokeless tobacco: Never Used  Substance and Sexual Activity  . Alcohol use: Yes    Alcohol/week: 0.0 standard drinks    Comment: 09/15/2013 "glass of wine maybe once/year"  . Drug use: No  . Sexual activity: Not  Currently  Lifestyle  . Physical activity    Days per week: Not on file    Minutes per session: Not on file  . Stress: Not on file  Relationships  . Social Herbalist on phone: Not on file    Gets together: Not on file    Attends religious service: Not on file    Active member of club or organization: Not on file    Attends meetings of clubs or organizations: Not on file    Relationship status: Not on file  . Intimate partner violence    Fear of current or ex partner: Not on file    Emotionally abused: Not on file    Physically abused: Not on file    Forced sexual activity: Not on file  Other Topics Concern  . Not on file  Social History Narrative   Patient lives at home alone and she is widowed.   Retired.   Education some college.   Right handed.   Caffeine sometimes not daily.       PHYSICAL EXAM  Vitals:   12/17/18 0925  BP: 140/62  Pulse: 80  Temp: 98 F (36.7 C)  TempSrc: Oral  Weight: 207 lb 6.4 oz (94.1 kg)  Height: 5\' 2"  (1.575 m)   Body mass index is 37.93 kg/m.  Generalized: Well developed, in no acute distress   Neurological examination  Mentation: Alert oriented to time, place, history taking. Follows all commands speech and language fluent Cranial nerve II-XII: Pupils were equal round reactive to light. Extraocular movements were full, visual field were full on confrontational test. Head turning and shoulder shrug  were normal and symmetric. Motor: The motor testing reveals 5 over 5 strength of all 4 extremities. Good symmetric motor tone is noted throughout.  Sensory: Sensory testing is intact to soft touch on all 4 extremities. No evidence of extinction is noted.  Coordination: Cerebellar testing reveals  good finger-nose-finger and heel-to-shin bilaterally.  Gait and station: Gait is normal.   Reflexes: Deep tendon reflexes are symmetric and normal bilaterally.   DIAGNOSTIC DATA (LABS, IMAGING, TESTING) - I reviewed patient records, labs, notes, testing and imaging myself where available.  Lab Results  Component Value Date   WBC 6.2 09/21/2018   HGB 12.3 09/21/2018   HCT 39.3 09/21/2018   MCV 90.8 09/21/2018   PLT 359 09/21/2018      Component Value Date/Time   NA 142 09/21/2018 1141   K 4.6 09/21/2018 1141   CL 107 09/21/2018 1141   CO2 27 09/21/2018 1141   GLUCOSE 168 (H) 09/21/2018 1141   BUN 38 (H) 09/21/2018 1141   CREATININE 2.08 (H) 09/21/2018 1141   CALCIUM 10.2 09/21/2018 1141   CALCIUM 10.2 09/21/2018 1141   PROT 7.4 09/21/2018 1141   ALBUMIN 4.5 07/22/2016 1337   AST 15 09/21/2018 1141   ALT 13 09/21/2018 1141   ALKPHOS 41 07/22/2016 1337   BILITOT 0.3 09/21/2018 1141   GFRNONAA 21 (L) 09/21/2018 1141   GFRAA 25 (L) 09/21/2018 1141   Lab Results  Component Value Date   CHOL 94 09/21/2018   HDL 27 (L) 09/21/2018   LDLCALC 43 09/21/2018   TRIG 165 (H) 09/21/2018   CHOLHDL 3.5 09/21/2018   Lab Results  Component Value Date   HGBA1C 6.9 (H) 09/21/2018   Lab Results  Component Value Date   VITAMINB12 1082 (H) 09/24/2010   Lab Results  Component Value Date   TSH 0.75 09/21/2018  ASSESSMENT AND PLAN 85 y.o. year old female  has a past medical history of Anemia, unspecified, Arthritis, Basal cell carcinoma, DM neuropathy, type II diabetes mellitus ([place]), Epilepsy ([place]), GERD (gastroesophageal reflux disease), Gout, High cholesterol, Hypertension, Hypothyroidism, Meniere's disease, Obesity (BMI 30-39.9), Other forms of epilepsy and recurrent seizures without mention of intractable epilepsy (11/04/2012), Pneumonia (~ 1943), Skin cancer, Type II or unspecified type diabetes mellitus without mention of complication, not stated as uncontrolled, UTI (urinary  tract infection), and Vitamin D deficiency. here with:  Seizures  -Continue Keppra 500 mg 3 times a day -Seizure precautions discussed  Overall the patient is doing well.  She was advised that if she has any seizure event she should let us know.  She will follow-up in 1 year or sooner if needed.   I spent 15 minutes with the patient. 50% of this time was spent discussing plan of care   Ward Givens, MSN, NP-C 12/17/2018, 9:31 AM [facility] [address] [place], [place] [address] [phone]

## 2018-12-17 NOTE — Progress Notes (Signed)
I have read the note, and I agree with the clinical assessment and plan.  Gissele Narducci K Ginamarie Banfield   

## 2018-12-17 NOTE — Patient Instructions (Addendum)
Continue Keppra 500 mg three times a day If you have any seizure events please let us know.

## 2018-12-18 ENCOUNTER — Encounter: Payer: Self-pay | Admitting: Physician Assistant

## 2018-12-18 ENCOUNTER — Ambulatory Visit: Payer: Medicare Other | Admitting: Physician Assistant

## 2018-12-18 ENCOUNTER — Other Ambulatory Visit: Payer: Self-pay

## 2018-12-18 DIAGNOSIS — Z0000 Encounter for general adult medical examination without abnormal findings: Secondary | ICD-10-CM

## 2018-12-18 DIAGNOSIS — R6889 Other general symptoms and signs: Secondary | ICD-10-CM | POA: Diagnosis not present

## 2018-12-18 DIAGNOSIS — Z794 Long term (current) use of insulin: Secondary | ICD-10-CM

## 2018-12-18 DIAGNOSIS — E1122 Type 2 diabetes mellitus with diabetic chronic kidney disease: Secondary | ICD-10-CM

## 2018-12-18 DIAGNOSIS — E559 Vitamin D deficiency, unspecified: Secondary | ICD-10-CM

## 2018-12-18 DIAGNOSIS — I10 Essential (primary) hypertension: Secondary | ICD-10-CM

## 2018-12-18 DIAGNOSIS — G40909 Epilepsy, unspecified, not intractable, without status epilepticus: Secondary | ICD-10-CM

## 2018-12-18 DIAGNOSIS — E119 Type 2 diabetes mellitus without complications: Secondary | ICD-10-CM | POA: Diagnosis not present

## 2018-12-18 DIAGNOSIS — E039 Hypothyroidism, unspecified: Secondary | ICD-10-CM

## 2018-12-18 DIAGNOSIS — N2581 Secondary hyperparathyroidism of renal origin: Secondary | ICD-10-CM

## 2018-12-18 DIAGNOSIS — Z0001 Encounter for general adult medical examination with abnormal findings: Secondary | ICD-10-CM | POA: Diagnosis not present

## 2018-12-18 DIAGNOSIS — E1149 Type 2 diabetes mellitus with other diabetic neurological complication: Secondary | ICD-10-CM

## 2018-12-18 DIAGNOSIS — N184 Chronic kidney disease, stage 4 (severe): Secondary | ICD-10-CM

## 2018-12-18 DIAGNOSIS — E782 Mixed hyperlipidemia: Secondary | ICD-10-CM

## 2018-12-18 DIAGNOSIS — G4733 Obstructive sleep apnea (adult) (pediatric): Secondary | ICD-10-CM

## 2018-12-18 DIAGNOSIS — D509 Iron deficiency anemia, unspecified: Secondary | ICD-10-CM

## 2018-12-18 DIAGNOSIS — Z789 Other specified health status: Secondary | ICD-10-CM

## 2018-12-18 DIAGNOSIS — D126 Benign neoplasm of colon, unspecified: Secondary | ICD-10-CM

## 2018-12-18 DIAGNOSIS — Z9989 Dependence on other enabling machines and devices: Secondary | ICD-10-CM

## 2018-12-18 DIAGNOSIS — K219 Gastro-esophageal reflux disease without esophagitis: Secondary | ICD-10-CM

## 2018-12-18 DIAGNOSIS — I872 Venous insufficiency (chronic) (peripheral): Secondary | ICD-10-CM

## 2018-12-18 NOTE — Patient Instructions (Signed)
Do the crestor every other day  Try to increase exercise to cut back on your sugars  Or decrease portion sizes of carbs, increase veggies  General eating tips  What to Avoid . Avoid added sugars o Often added sugar can be found in processed foods such as many condiments, dry cereals, cakes, cookies, chips, crisps, crackers, candies, sweetened drinks, etc.  o Read labels and AVOID/DECREASE use of foods with the following in their ingredient list: Sugar, fructose, high fructose corn syrup, sucrose, glucose, maltose, dextrose, molasses, cane sugar, brown sugar, any type of syrup, agave nectar, etc.   . Avoid snacking in between meals- drink water or if you feel you need a snack, pick a high water content snack such as cucumbers, watermelon, or any veggie.  Marland Kitchen Avoid foods made with flour o If you are going to eat food made with flour, choose those made with whole-grains; and, minimize your consumption as much as is tolerable . Avoid processed foods o These foods are generally stocked in the middle of the grocery store.  o Focus on shopping on the perimeter of the grocery.  What to Include . Vegetables o GREEN LEAFY VEGETABLES: Kale, spinach, mustard greens, collard greens, cabbage, broccoli, etc. o OTHER: Asparagus, cauliflower, eggplant, carrots, peas, Brussel sprouts, tomatoes, bell peppers, zucchini, beets, cucumbers, etc. . Grains, seeds, and legumes o Beans: kidney beans, black eyed peas, garbanzo beans, black beans, pinto beans, etc. o Whole, unrefined grains: brown rice, barley, bulgur, oatmeal, etc. . Healthy fats  o Avoid highly processed fats such as vegetable oil o Examples of healthy fats: avocado, olives, virgin olive oil, dark chocolate (?72% Cocoa), nuts (peanuts, almonds, walnuts, cashews, pecans, etc.) o Please still do small amount of these healthy fats, they are dense in calories.  . Low - Moderate Intake of Animal Sources of Protein o Meat sources: chicken, [place],  salmon, tuna. Limit to 4 ounces of meat at one time or the size of your palm. o Consider limiting dairy sources, but when choosing dairy focus on: PLAIN [place] yogurt, cottage cheese, high-protein milk . Fruit o Choose berries

## 2018-12-31 ENCOUNTER — Ambulatory Visit: Payer: Medicare Other | Admitting: Adult Health

## 2018-12-31 NOTE — Progress Notes (Signed)
FOLLOW UP  Assessment and Plan:   Hypertension Well controlled with current medications  Monitor blood pressure at home; patient to call if consistently greater than 130/80 Continue DASH diet.   Reminder to go to the ER if any CP, SOB, nausea, dizziness, severe HA, changes vision/speech, left arm numbness and tingling and jaw pain.  Hyperlipidemia associated with T2DM ([place])  Currently at goal; continue statin  Continue low cholesterol diet and exercise.  Check lipid panel.   Diabetes with diabetic chronic kidney disease ([place]) Continue medication: humulin 70/30 takes 28 units AM, 25 units PM  Taking onglyza 5 mg daily; will reduce to 2.5 mg daily due to GFR <45 Continue diet and exercise.  Perform daily foot/skin check, notify office of any concerning changes.  Check A1C  Seizure disorder No recent seizures; continue keppra  Followed by Dr. Brett Fairy Check keppra levels  CKD stage 4 (HCC) Decrease onglyza from 5 mg to 2.5 mg due to GFR <45 Follows with Dr. Harrie Jeans would not have dialysis, advised to avoid NSAIDS Increase fluids, monitor sugars, will monitor microalbumin as this is overdue  CMP/GFR  Hyperparathyroid, secondary, renal origin ([place]) Monitor calcium q82m and as needed; recently improved CMP/GFR  Morbid Obesity - BMI 35+ with co morbidities Long discussion about weight loss, diet, and exercise Recommended diet heavy in fruits and veggies and low in animal meats, cheeses, and dairy products, appropriate calorie intake Discussed ideal weight for height  Will follow up in 3 months  Hypothyroidism continue medications the same pending lab results reminded to take on an empty stomach 30-44mins before food.  check TSH level  Vitamin D Def At goal at last visit; continue supplementation to maintain goal of 70-100 Defer Vit D level  GERD Well managed on current medications Discussed diet, avoiding triggers and other lifestyle changes   Continue diet  and meds as discussed. Further disposition pending results of labs. Discussed med's effects and SE's.   Over 30 minutes of exam, counseling, chart review, and critical decision making was performed.   Future Appointments  Date Time Provider [place]  04/05/2019 10:30 AM Unk Pinto, MD [facility] None  11/01/2019 10:00 AM Unk Pinto, MD [facility] None  12/20/2019 10:30 AM Ward Givens, NP [facility] None  02/02/2020 10:00 AM Vicie Mutters, PA-C [facility] None    ----------------------------------------------------------------------------------------------------------------------  HPI 85 y.o. female  presents for 3 month follow up on hypertension, cholesterol, diabetes with CKD 4, hypothyroid, morbid obesity and vitamin D deficiency.   She is living at [facility], she is able to go outdoors, she is doing well. Spending time in garden.   Patient also has hx/o Seizure Disorder manifest with fugue states /automatisms controlled on Keppra and followed by Dr Brett Fairy. Last seizure was over 1 year ago. She has OSA recommended CPAP but admits she has not been wearing for several years.   GERD is controlled by prevacid 30 mg daily.   BMI is Body mass index is 38.04 kg/m., she has been working on diet - limited in exercise. Walking with her neighbor close to home, walking once daily since weather is cooler, walking around 30 min, slowly with mild hills.  Wt Readings from Last 3 Encounters:  01/01/19 208 lb (94.3 kg)  12/17/18 207 lb 6.4 oz (94.1 kg)  09/21/18 205 lb 6.4 oz (93.2 kg)   She has BP cuff at home but admits she hasn't checked in some time, today their BP is BP: (!) 129/58  She does not workout.  She endorses exertional dyspnea with walking up hills since starting her walking program, denies cp. Resolves quickly with rest. Denies dizziness, syncope or fatigue. She reports intermittent mild edema, wears compression hose, uses lasix 40 mg PRN, typically once a week.     She is on cholesterol medication, crestor 5 mg every other day and denies myalgias. Her cholesterol is at goal. The cholesterol last visit was:   Lab Results  Component Value Date   CHOL 94 09/21/2018   HDL 27 (L) 09/21/2018   LDLCALC 43 09/21/2018   TRIG 165 (H) 09/21/2018   CHOLHDL 3.5 09/21/2018    She has been working on diet and exercise for T2 diabetes, taking onglyza 5 mg and novolin 70/30 28 units in the AM and 25 units PM, and denies foot ulcerations, hypoglycemia , increased appetite, nausea, polydipsia, polyuria, visual disturbances, vomiting and weight loss. She does check fasting sugars, which have ranged 135-150's, prior to dinner rungs 114-150s. She will often eat a late breakfast and skip lunch and eat dinner. Off metformin due to CKD stage 4 and decline. Last A1C in the office was:  Lab Results  Component Value Date   HGBA1C 6.9 (H) 09/21/2018   She has stable CKD IV followed by Dr. Harrie Jeans  Lab Results  Component Value Date   GFRNONAA 21 (L) 09/21/2018   She is on thyroid medication due to hypothyroid. Her medication was not changed last visit.  Taking 100 mcg daily.  Lab Results  Component Value Date   TSH 0.75 09/21/2018   Patient is on Vitamin D supplement and was at goal at recent check:   Lab Results  Component Value Date   VD25OH 80 09/21/2018      Current Medications:  Current Outpatient Medications on File Prior to Visit  Medication Sig  . acetaminophen (TYLENOL) 500 MG tablet Take 1,000 mg by mouth every 6 (six) hours as needed for moderate pain.  Marland Kitchen aspirin 81 MG tablet Take 81 mg by mouth daily.  Marland Kitchen atenolol (TENORMIN) 50 MG tablet TAKE 1 TABLET BY MOUTH DAILY  . ferrous sulfate dried (SLOW FE) 160 (50 FE) MG TBCR Take 160 mg by mouth 2 (two) times daily.   . fish oil-omega-3 fatty acids 1000 MG capsule Take 2 g by mouth at bedtime.   . Flaxseed, Linseed, (FLAX SEED OIL) 1000 MG CAPS Take 1 capsule by mouth 3 (three) times daily.  .  furosemide (LASIX) 40 MG tablet TAKE 1 TABLET BY MOUTH EVERY DAY (Patient taking differently: Take 40 mg by mouth as needed. )  . glucose blood (FREESTYLE INSULINX TEST) test strip Check glucose 3 x/day before meals  . insulin NPH-regular Human (NOVOLIN 70/30) (70-30) 100 UNIT/ML injection Takes 28 units qam and 26 units qpm (Patient taking differently: Takes 28 units qam and 25 units qpm)  . Lancets (FREESTYLE) lancets Use to check glucose 3  x/day  . lansoprazole (PREVACID) 30 MG capsule Take 1 capsule Daily for Indigestion & Heartburn  . levETIRAcetam (KEPPRA) 500 MG tablet Take 1 tablet 3 x /day after meals to prevent seizures  . levothyroxine (SYNTHROID) 100 MCG tablet TAKE 1 TABLET BY MOUTH DAILY  . loratadine (CLARITIN) 10 MG tablet Take 10 mg by mouth daily.  . meclizine (ANTIVERT) 25 MG tablet Take 1 tablet up yo 3 x / day if needed for Dizziness / Vertigo  . rOPINIRole (REQUIP) 1 MG tablet Take 1/2 to 1 tablet 3 x /day as needed for Restless Legs &  Cramps  . rosuvastatin (CRESTOR) 5 MG tablet Take 1 tablet Daily for Cholesterol  . saxagliptin HCl (ONGLYZA) 5 MG TABS tablet Take 1 tablet Daily for Diabetes  . terbinafine (LAMISIL) 250 MG tablet Take 1 tablet daily for Toenail Fungus  . vitamin B-12 (CYANOCOBALAMIN) 100 MCG tablet Take 500 mcg by mouth daily.   . vitamin C (ASCORBIC ACID) 500 MG tablet Take 1,000 mg by mouth 3 (three) times daily.   . potassium chloride SA (K-DUR,KLOR-CON) 20 MEQ tablet Take 1 tablet (20 mEq total) by mouth daily. (Patient not taking: Reported on 01/01/2019)   No current facility-administered medications on file prior to visit.      Allergies:  Allergies  Allergen Reactions  . Ppd [Tuberculin Purified Protein Derivative] Other (See Comments)    indurated     Medical History:  Past Medical History:  Diagnosis Date  . Anemia, unspecified   . Arthritis    "knees; right shoulder" (09/15/2013)  . Basal cell carcinoma    "right upper outer lip"   . DM neuropathy, type II diabetes mellitus ([place])   . Epilepsy ([place])   . GERD (gastroesophageal reflux disease)   . Gout   . High cholesterol   . Hypertension   . Hypothyroidism   . Meniere's disease   . Obesity (BMI 30-39.9)   . Other forms of epilepsy and recurrent seizures without mention of intractable epilepsy 11/04/2012   Non convulsive paroxysmal spells, responding to [facility], patient not driving.   . Pneumonia ~ 1943  . Skin cancer    "forehead; right hand"  . Type II or unspecified type diabetes mellitus without mention of complication, not stated as uncontrolled   . UTI (urinary tract infection)   . Vitamin D deficiency    Family history- Reviewed and unchanged Social history- Reviewed and unchanged   Review of Systems:  Review of Systems  Constitutional: Negative for malaise/fatigue and weight loss.  HENT: Negative for hearing loss and tinnitus.   Eyes: Negative for blurred vision and double vision.  Respiratory: Negative for cough, shortness of breath (exertional dyspnea with hills, quickly resolves with rest) and wheezing.   Cardiovascular: Negative for chest pain, palpitations, orthopnea, claudication and leg swelling.  Gastrointestinal: Negative for abdominal pain, blood in stool, constipation, diarrhea, heartburn, melena, nausea and vomiting.  Genitourinary: Negative.   Musculoskeletal: Negative for falls, joint pain and myalgias.  Skin: Negative for rash.  Neurological: Negative for dizziness, tingling, sensory change, weakness and headaches.  Endo/Heme/Allergies: Negative for polydipsia.  Psychiatric/Behavioral: Negative.   All other systems reviewed and are negative.   Physical Exam: BP (!) 129/58   Pulse 80   Temp (!) 97.3 F (36.3 C)   Resp 16   Ht 5\' 2"  (1.575 m)   Wt 208 lb (94.3 kg)   BMI 38.04 kg/m  Wt Readings from Last 3 Encounters:  01/01/19 208 lb (94.3 kg)  12/17/18 207 lb 6.4 oz (94.1 kg)  09/21/18 205 lb 6.4 oz (93.2 kg)   General  Appearance: Well nourished, in no apparent distress. Eyes: PERRLA, EOMs, conjunctiva no swelling or erythema Sinuses: No Frontal/maxillary tenderness ENT/Mouth: Ext aud canals clear, TMs without erythema, bulging. No erythema, swelling, or exudate on post pharynx.  Tonsils not swollen or erythematous. Hearing normal.  Neck: Supple, thyroid normal.  Respiratory: Respiratory effort normal, BS equal bilaterally without rales, rhonchi, wheezing or stridor.  Cardio: RRR with no RGs,  2/6 early systolic murmur at L 2nd ICD with radiation to neck. Symmetrical  peripheral pulses without notable edema (wearing compression hose).  Abdomen: Soft, + BS.  Non tender, no guarding, rebound, hernias, masses. Lymphatics: Non tender without lymphadenopathy.  Musculoskeletal: Full ROM, Symmetrical strength, Slow gait Skin: Warm, dry without rashes, lesions, ecchymosis. Bilateral feet skin intact.  Neuro: Cranial nerves intact. No cerebellar symptoms.  Psych: Awake and oriented X 3, normal affect, Insight and Judgment appropriate.    Izora Ribas, NP 11:41 AM [facility]

## 2019-01-01 ENCOUNTER — Other Ambulatory Visit: Payer: Self-pay

## 2019-01-01 ENCOUNTER — Ambulatory Visit (INDEPENDENT_AMBULATORY_CARE_PROVIDER_SITE_OTHER): Payer: Medicare Other | Admitting: Adult Health

## 2019-01-01 ENCOUNTER — Encounter: Payer: Self-pay | Admitting: Adult Health

## 2019-01-01 VITALS — BP 129/58 | HR 80 | Temp 97.3°F | Resp 16 | Ht 62.0 in | Wt 208.0 lb

## 2019-01-01 DIAGNOSIS — N184 Chronic kidney disease, stage 4 (severe): Secondary | ICD-10-CM

## 2019-01-01 DIAGNOSIS — Z794 Long term (current) use of insulin: Secondary | ICD-10-CM

## 2019-01-01 DIAGNOSIS — E782 Mixed hyperlipidemia: Secondary | ICD-10-CM

## 2019-01-01 DIAGNOSIS — N2581 Secondary hyperparathyroidism of renal origin: Secondary | ICD-10-CM

## 2019-01-01 DIAGNOSIS — E039 Hypothyroidism, unspecified: Secondary | ICD-10-CM

## 2019-01-01 DIAGNOSIS — K219 Gastro-esophageal reflux disease without esophagitis: Secondary | ICD-10-CM | POA: Diagnosis not present

## 2019-01-01 DIAGNOSIS — E559 Vitamin D deficiency, unspecified: Secondary | ICD-10-CM

## 2019-01-01 DIAGNOSIS — I10 Essential (primary) hypertension: Secondary | ICD-10-CM | POA: Diagnosis not present

## 2019-01-01 DIAGNOSIS — E1122 Type 2 diabetes mellitus with diabetic chronic kidney disease: Secondary | ICD-10-CM | POA: Diagnosis not present

## 2019-01-01 DIAGNOSIS — G40909 Epilepsy, unspecified, not intractable, without status epilepticus: Secondary | ICD-10-CM

## 2019-01-01 MED ORDER — SAXAGLIPTIN HCL 2.5 MG PO TABS: ORAL_TABLET | ORAL | 1 refills | Status: DC

## 2019-01-01 MED ORDER — NOVOLIN 70/30 (70-30) 100 UNIT/ML ~~LOC~~ SUSP: SUBCUTANEOUS | 3 refills | Status: DC

## 2019-01-01 NOTE — Patient Instructions (Addendum)
  Goals    . HEMOGLOBIN A1C < 8.0     You can continue the onglyza 2.5 mg a day Suggest taking your night time insulin WITH your dinner  Fasting glucose goal <150 Keep daily BID glucose log and bring to each visit Increase insulin to 28 units AM, 26 units PM     . LDL CALC < 70    . Weight (lb) < 200 lb (90.7 kg)     Try to add in more veggies for weight loss Keep the food diary and monitor what foods make your sugars go up  Suggest trying chair aerobics, getting a pool or can try tai chi This will help a lot with your joints, with your sugars and movement        Monitor swelling in ankles and let us know if needing to use lasix/furosemide more frequently  Please decrease onglyza from 5 mg daily to 2.5 mg daily (1/2 tab) - call when you run out and we will send in 2.5 mg tabs        Bad carbs also include fruit juice, alcohol, and sweet tea. These are empty calories that do not signal to your brain that you are full.   Please remember the good carbs are still carbs which convert into sugar. So please measure them out no more than 1/2-1 cup of rice, oatmeal, pasta, and beans  Veggies are however free foods! Pile them on.   Not all fruit is created equal. Please see the list below, the fruit at the bottom is higher in sugars than the fruit at the top. Please avoid all dried fruits.

## 2019-01-02 ENCOUNTER — Other Ambulatory Visit: Payer: Self-pay | Admitting: Adult Health

## 2019-01-02 DIAGNOSIS — E1169 Type 2 diabetes mellitus with other specified complication: Secondary | ICD-10-CM

## 2019-01-02 MED ORDER — ROSUVASTATIN CALCIUM 5 MG PO TABS: ORAL_TABLET | ORAL | 1 refills | Status: DC

## 2019-01-05 ENCOUNTER — Other Ambulatory Visit: Payer: Self-pay | Admitting: Adult Health

## 2019-01-05 DIAGNOSIS — D649 Anemia, unspecified: Secondary | ICD-10-CM

## 2019-01-05 LAB — IRON,TIBC AND FERRITIN PANEL
%SAT: 25 % (calc) (ref 16–45)
Ferritin: 151 ng/mL (ref 16–288)
Iron: 61 ug/dL (ref 45–160)
TIBC: 246 mcg/dL (calc) — ABNORMAL LOW (ref 250–450)

## 2019-01-05 LAB — COMPLETE METABOLIC PANEL WITH GFR
AG Ratio: 1.8 (calc) (ref 1.0–2.5)
ALT: 12 U/L (ref 6–29)
AST: 14 U/L (ref 10–35)
Albumin: 4.4 g/dL (ref 3.6–5.1)
Alkaline phosphatase (APISO): 55 U/L (ref 37–153)
BUN/Creatinine Ratio: 23 (calc) — ABNORMAL HIGH (ref 6–22)
BUN: 43 mg/dL — ABNORMAL HIGH (ref 7–25)
CO2: 22 mmol/L (ref 20–32)
Calcium: 10.2 mg/dL (ref 8.6–10.4)
Chloride: 110 mmol/L (ref 98–110)
Creat: 1.9 mg/dL — ABNORMAL HIGH (ref 0.60–0.88)
GFR, Est African American: 28 mL/min/{1.73_m2} — ABNORMAL LOW (ref >=60)
GFR, Est Non African American: 24 mL/min/{1.73_m2} — ABNORMAL LOW (ref >=60)
Globulin: 2.5 g/dL (calc) (ref 1.9–3.7)
Glucose, Bld: 162 mg/dL — ABNORMAL HIGH (ref 65–99)
Potassium: 4.9 mmol/L (ref 3.5–5.3)
Sodium: 140 mmol/L (ref 135–146)
Total Bilirubin: 0.4 mg/dL (ref 0.2–1.2)
Total Protein: 6.9 g/dL (ref 6.1–8.1)

## 2019-01-05 LAB — HEMOGLOBIN A1C
Hgb A1c MFr Bld: 6.5 % of total Hgb — ABNORMAL HIGH (ref <5.7)
Mean Plasma Glucose: 140 (calc)
eAG (mmol/L): 7.7 (calc)

## 2019-01-05 LAB — TEST AUTHORIZATION

## 2019-01-05 LAB — CBC WITH DIFFERENTIAL/PLATELET
Absolute Monocytes: 585 cells/uL (ref 200–950)
Basophils Absolute: 30 cells/uL (ref 0–200)
Basophils Relative: 0.4 %
Eosinophils Absolute: 555 cells/uL — ABNORMAL HIGH (ref 15–500)
Eosinophils Relative: 7.3 %
HCT: 34.6 % — ABNORMAL LOW (ref 35.0–45.0)
Hemoglobin: 10.6 g/dL — ABNORMAL LOW (ref 11.7–15.5)
Lymphs Abs: 874 cells/uL (ref 850–3900)
MCH: 26.8 pg — ABNORMAL LOW (ref 27.0–33.0)
MCHC: 30.6 g/dL — ABNORMAL LOW (ref 32.0–36.0)
MCV: 87.6 fL (ref 80.0–100.0)
MPV: 10.5 fL (ref 7.5–12.5)
Monocytes Relative: 7.7 %
Neutro Abs: 5556 cells/uL (ref 1500–7800)
Neutrophils Relative %: 73.1 %
Platelets: 419 10*3/uL — ABNORMAL HIGH (ref 140–400)
RBC: 3.95 10*6/uL (ref 3.80–5.10)
RDW: 18.5 % — ABNORMAL HIGH (ref 11.0–15.0)
Total Lymphocyte: 11.5 %
WBC: 7.6 10*3/uL (ref 3.8–10.8)

## 2019-01-05 LAB — MAGNESIUM: Magnesium: 2.3 mg/dL (ref 1.5–2.5)

## 2019-01-05 LAB — LIPID PANEL
Cholesterol: 81 mg/dL (ref <200)
HDL: 25 mg/dL — ABNORMAL LOW (ref >=50)
LDL Cholesterol (Calc): 35 mg/dL (calc)
Non-HDL Cholesterol (Calc): 56 mg/dL (calc) (ref <130)
Total CHOL/HDL Ratio: 3.2 (calc) (ref <5.0)
Triglycerides: 125 mg/dL (ref <150)

## 2019-01-05 LAB — LEVETIRACETAM LEVEL: Keppra (Levetiracetam): 40 ug/mL (ref 12.0–46.0)

## 2019-01-05 LAB — MICROALBUMIN / CREATININE URINE RATIO
Creatinine, Urine: 77 mg/dL (ref 20–275)
Microalb Creat Ratio: 103 mcg/mg creat — ABNORMAL HIGH (ref <30)
Microalb, Ur: 7.9 mg/dL

## 2019-01-05 LAB — TSH: TSH: 1.02 mIU/L (ref 0.40–4.50)

## 2019-01-12 ENCOUNTER — Other Ambulatory Visit: Payer: Self-pay | Admitting: Internal Medicine

## 2019-01-12 DIAGNOSIS — G40909 Epilepsy, unspecified, not intractable, without status epilepticus: Secondary | ICD-10-CM

## 2019-01-19 ENCOUNTER — Other Ambulatory Visit: Payer: Self-pay

## 2019-01-19 ENCOUNTER — Ambulatory Visit: Payer: Medicare Other

## 2019-01-19 DIAGNOSIS — D649 Anemia, unspecified: Secondary | ICD-10-CM

## 2019-01-19 LAB — CBC WITH DIFFERENTIAL/PLATELET
Absolute Monocytes: 433 cells/uL (ref 200–950)
Basophils Absolute: 31 cells/uL (ref 0–200)
Basophils Relative: 0.5 %
Eosinophils Absolute: 531 cells/uL — ABNORMAL HIGH (ref 15–500)
Eosinophils Relative: 8.7 %
HCT: 35.1 % (ref 35.0–45.0)
Hemoglobin: 10.7 g/dL — ABNORMAL LOW (ref 11.7–15.5)
Lymphs Abs: 702 cells/uL — ABNORMAL LOW (ref 850–3900)
MCH: 26.8 pg — ABNORMAL LOW (ref 27.0–33.0)
MCHC: 30.5 g/dL — ABNORMAL LOW (ref 32.0–36.0)
MCV: 88 fL (ref 80.0–100.0)
MPV: 10.8 fL (ref 7.5–12.5)
Monocytes Relative: 7.1 %
Neutro Abs: 4404 cells/uL (ref 1500–7800)
Neutrophils Relative %: 72.2 %
Platelets: 418 10*3/uL — ABNORMAL HIGH (ref 140–400)
RBC: 3.99 10*6/uL (ref 3.80–5.10)
RDW: 18.2 % — ABNORMAL HIGH (ref 11.0–15.0)
Total Lymphocyte: 11.5 %
WBC: 6.1 10*3/uL (ref 3.8–10.8)

## 2019-01-19 LAB — RETICULOCYTES
ABS Retic: 83790 cells/uL — ABNORMAL HIGH (ref 20000–8000)
Retic Ct Pct: 2.1 %

## 2019-01-19 NOTE — Progress Notes (Signed)
Reports for LAB bld work that has been entered in by provider.  Vitals entered as well  Patient reports she take 2 iron tablets daily

## 2019-01-20 ENCOUNTER — Other Ambulatory Visit: Payer: Self-pay | Admitting: Adult Health

## 2019-01-20 DIAGNOSIS — D649 Anemia, unspecified: Secondary | ICD-10-CM

## 2019-02-03 ENCOUNTER — Other Ambulatory Visit: Payer: Self-pay

## 2019-02-03 DIAGNOSIS — D649 Anemia, unspecified: Secondary | ICD-10-CM

## 2019-02-03 LAB — POC HEMOCCULT BLD/STL (HOME/3-CARD/SCREEN)
Card #2 Fecal Occult Blod, POC: NEGATIVE
Card #3 Fecal Occult Blood, POC: NEGATIVE
Fecal Occult Blood, POC: NEGATIVE

## 2019-02-08 DIAGNOSIS — Z1211 Encounter for screening for malignant neoplasm of colon: Secondary | ICD-10-CM | POA: Diagnosis not present

## 2019-02-08 DIAGNOSIS — Z1212 Encounter for screening for malignant neoplasm of rectum: Secondary | ICD-10-CM

## 2019-02-18 ENCOUNTER — Other Ambulatory Visit: Payer: Self-pay | Admitting: Internal Medicine

## 2019-02-19 DIAGNOSIS — R0600 Dyspnea, unspecified: Secondary | ICD-10-CM

## 2019-02-19 HISTORY — DX: Dyspnea, unspecified: R06.00

## 2019-02-22 ENCOUNTER — Ambulatory Visit (INDEPENDENT_AMBULATORY_CARE_PROVIDER_SITE_OTHER): Payer: Medicare Other

## 2019-02-22 ENCOUNTER — Other Ambulatory Visit: Payer: Self-pay

## 2019-02-22 DIAGNOSIS — D649 Anemia, unspecified: Secondary | ICD-10-CM | POA: Diagnosis not present

## 2019-02-22 NOTE — Progress Notes (Signed)
PT reports for lab blood work which was entered in by the provider. The patient states she has started back taking her IRON supplements. Vitals entered into computer as well

## 2019-02-23 LAB — CBC WITH DIFFERENTIAL/PLATELET
Absolute Monocytes: 350 cells/uL (ref 200–950)
Basophils Absolute: 33 cells/uL (ref 0–200)
Basophils Relative: 0.5 %
Eosinophils Absolute: 515 cells/uL — ABNORMAL HIGH (ref 15–500)
Eosinophils Relative: 7.8 %
HCT: 34 % — ABNORMAL LOW (ref 35.0–45.0)
Hemoglobin: 10.4 g/dL — ABNORMAL LOW (ref 11.7–15.5)
Lymphs Abs: 805 cells/uL — ABNORMAL LOW (ref 850–3900)
MCH: 26.8 pg — ABNORMAL LOW (ref 27.0–33.0)
MCHC: 30.6 g/dL — ABNORMAL LOW (ref 32.0–36.0)
MCV: 87.6 fL (ref 80.0–100.0)
MPV: 10.2 fL (ref 7.5–12.5)
Monocytes Relative: 5.3 %
Neutro Abs: 4897 cells/uL (ref 1500–7800)
Neutrophils Relative %: 74.2 %
Platelets: 423 10*3/uL — ABNORMAL HIGH (ref 140–400)
RBC: 3.88 10*6/uL (ref 3.80–5.10)
RDW: 18.6 % — ABNORMAL HIGH (ref 11.0–15.0)
Total Lymphocyte: 12.2 %
WBC: 6.6 10*3/uL (ref 3.8–10.8)

## 2019-02-23 LAB — FOLATE RBC: RBC Folate: 943 ng/mL RBC (ref >280)

## 2019-02-23 LAB — VITAMIN B12: Vitamin B-12: 1404 pg/mL — ABNORMAL HIGH (ref 200–1100)

## 2019-02-24 ENCOUNTER — Encounter: Payer: Self-pay | Admitting: Adult Health

## 2019-04-04 ENCOUNTER — Encounter: Payer: Self-pay | Admitting: Internal Medicine

## 2019-04-04 NOTE — Progress Notes (Signed)
History of Present Illness:      This very nice 86 y.o. WWF  presents for 6 month follow up with HTN, HLD, Insulin Requiring T2_DM w/ CKD and Vitamin D Deficiency. Patient is followed by Dr Brett Fairy for a seizure disorder.       Patient is treated for HTN (1999) & BP has been controlled at home. Today's BP: 134/60. Patient has had no complaints of any cardiac type chest pain, palpitations, dyspnea / orthopnea / PND, dizziness, claudication, or dependent edema.      Hyperlipidemia is controlled with diet & meds. Patient denies myalgias or other med SE's. Last Lipids were at goal:  Lab Results  Component Value Date   CHOL 81 01/01/2019   HDL 25 (L) 01/01/2019   LDLCALC 35 01/01/2019   TRIG 125 01/01/2019   CHOLHDL 3.2 01/01/2019    Also, the patient has history Morbid Obesity (BMI 38+) and Insulin requiring T2_DM w/CKD4 and is on bid dosing of Novolin 70/30. Patient has Diabetic peripheral sensory Neuropathy. She also has Diabetic CKD4 w/anemia of Renal Disease and also secondary HyperPTH followed by Dr Harrie Jeans. and has had no symptoms of reactive hypoglycemia, diabetic polys or visual blurring, but does c/o paresthesias of her legs.  Last A1c was not at goal:  Lab Results  Component Value Date   HGBA1C 6.5 (H) 01/01/2019        Patient has been on thyroid replacement since the 1980's.      Further, the patient also has history of Vitamin D Deficiency ("37" / 2008) and supplements vitamin D without any suspected side-effects. Last vitamin D was at goal:  Lab Results  Component Value Date   VD25OH 44 09/21/2018    Current Outpatient Medications on File Prior to Visit  Medication Sig  . acetaminophen (TYLENOL) 500 MG tablet Take 1,000 mg by mouth every 6 (six) hours as needed for moderate pain.  Marland Kitchen aspirin 81 MG tablet Take 81 mg by mouth daily.  Marland Kitchen atenolol (TENORMIN) 50 MG tablet Take 1 tablet (50 mg total) by mouth daily. Take 1 tablet Daily for BP  . cholecalciferol  (VITAMIN D3) 25 MCG (1000 UNIT) tablet Take 1,000 Units by mouth daily. Per Nephrologist, start taking 1000 units, but patient taking 5000 units presently.  . ferrous sulfate dried (SLOW FE) 160 (50 FE) MG TBCR Take 160 mg by mouth 2 (two) times daily.   . fish oil-omega-3 fatty acids 1000 MG capsule Take 2 g by mouth at bedtime.   . Flaxseed, Linseed, (FLAX SEED OIL) 1000 MG CAPS Take 1 capsule by mouth 3 (three) times daily.  . furosemide (LASIX) 40 MG tablet TAKE 1 TABLET BY MOUTH EVERY DAY (Patient taking differently: Take 40 mg by mouth as needed. )  . insulin NPH-regular Human (NOVOLIN 70/30) (70-30) 100 UNIT/ML injection Takes 28 units qam and 25 units qpm  . Lancets (FREESTYLE) lancets Use to check glucose 3  x/day  . lansoprazole (PREVACID) 30 MG capsule Take 1 capsule Daily for Indigestion & Heartburn  . levETIRAcetam (KEPPRA) 500 MG tablet Take 1 tablet 3 x /day after Meals to Prevent Seizures  . levothyroxine (SYNTHROID) 100 MCG tablet TAKE 1 TABLET BY MOUTH DAILY  . loratadine (CLARITIN) 10 MG tablet Take 10 mg by mouth daily.  . meclizine (ANTIVERT) 25 MG tablet Take 1 tablet up yo 3 x / day if needed for Dizziness / Vertigo  . rOPINIRole (REQUIP) 1 MG tablet Take  1/2 to 1 tablet 3 x /day as needed for Restless Legs & Cramps  . rosuvastatin (CRESTOR) 5 MG tablet Take 1 tablet once weekly for Cholesterol  . saxagliptin HCl (ONGLYZA) 2.5 MG TABS tablet Take 1 tablet Daily for Diabetes  . vitamin B-12 (CYANOCOBALAMIN) 100 MCG tablet Take 500 mcg by mouth daily.   . vitamin C (ASCORBIC ACID) 500 MG tablet Take 1,000 mg by mouth 3 (three) times daily.    No current facility-administered medications on file prior to visit.    Allergies  Allergen Reactions  . Ppd [Tuberculin Purified Protein Derivative] Other (See Comments)    indurated    PMHx:   Past Medical History:  Diagnosis Date  . Anemia, unspecified   . Arthritis    "knees; right shoulder" (09/15/2013)  . Basal cell  carcinoma    "right upper outer lip"  . DM neuropathy, type II diabetes mellitus ([place])   . Epilepsy ([place])   . GERD (gastroesophageal reflux disease)   . Gout   . High cholesterol   . Hypertension   . Hypothyroidism   . Meniere's disease   . Obesity (BMI 30-39.9)   . Other forms of epilepsy and recurrent seizures without mention of intractable epilepsy 11/04/2012   Non convulsive paroxysmal spells, responding to [facility], patient not driving.   . Pneumonia ~ 1943  . Skin cancer    "forehead; right hand"  . Type II or unspecified type diabetes mellitus without mention of complication, not stated as uncontrolled   . UTI (urinary tract infection)   . Vitamin D deficiency     Immunization History  Administered Date(s) Administered  . DTaP 03/18/2007  . Influenza, High Dose Seasonal PF 10/18/2013, 12/12/2014, 10/24/2015, 10/28/2016, 12/08/2017, 12/01/2018  . Influenza-Unspecified 11/18/2012  . PFIZER SARS-COV-2 Vaccination 03/10/2019, 03/31/2019  . Pneumococcal Conjugate-13 12/12/2014  . Pneumococcal Polysaccharide-23 09/16/2013  . Td 12/08/2017  . Zoster 02/28/2005    Past Surgical History:  Procedure Laterality Date  . CERVICAL POLYPECTOMY    . HAMMER TOE SURGERY Left 1980's  . MOHS SURGERY  20087   "right upper outer lip"  . TONSILLECTOMY  ~ 1947  . WRIST SURGERY Left ~ 1950   "ran arm thru window"    FHx:    Reviewed / unchanged  SHx:    Reviewed / unchanged   Systems Review:  Constitutional: Denies fever, chills, wt changes, headaches, insomnia, fatigue, night sweats, change in appetite. Eyes: Denies redness, blurred vision, diplopia, discharge, itchy, watery eyes.  ENT: Denies discharge, congestion, post nasal drip, epistaxis, sore throat, earache, hearing loss, dental pain, tinnitus, vertigo, sinus pain, snoring.  CV: Denies chest pain, palpitations, irregular heartbeat, syncope, dyspnea, diaphoresis, orthopnea, PND, claudication or edema. Respiratory: denies  cough, dyspnea, DOE, pleurisy, hoarseness, laryngitis, wheezing.  Gastrointestinal: Denies dysphagia, odynophagia, heartburn, reflux, water brash, abdominal pain or cramps, nausea, vomiting, bloating, diarrhea, constipation, hematemesis, melena, hematochezia  or hemorrhoids. Genitourinary: Denies dysuria, frequency, urgency, nocturia, hesitancy, discharge, hematuria or flank pain. Musculoskeletal: Denies arthralgias, myalgias, stiffness, jt. swelling, pain, limping or strain/sprain.  Skin: Denies pruritus, rash, hives, warts, acne, eczema or change in skin lesion(s). Neuro: No weakness, tremor, incoordination, spasms, paresthesia or pain. Psychiatric: Denies confusion, memory loss or sensory loss. Endo: Denies change in weight, skin or hair change.  Heme/Lymph: No excessive bleeding, bruising or enlarged lymph nodes.  Physical Exam  BP 134/60   Pulse 84   Temp (!) 97.2 F (36.2 C)   Resp 16   Ht 5\' 2"  (  1.575 m)   Wt 208 lb (94.3 kg)   BMI 38.04 kg/m   Appears  over nourished, well groomed  and in no distress.  Eyes: PERRLA, EOMs, conjunctiva no swelling or erythema. Sinuses: No frontal/maxillary tenderness ENT/Mouth: EAC's clear, TM's nl w/o erythema, bulging. Nares clear w/o erythema, swelling, exudates. Oropharynx clear without erythema or exudates. Oral hygiene is good. Tongue normal, non obstructing. Hearing intact.  Neck: Supple. Thyroid not palpable. Car 2+/2+ without bruits, nodes or JVD. Chest: Respirations nl with BS clear & equal w/o rales, rhonchi, wheezing or stridor.  Cor: Heart sounds normal w/ regular rate and rhythm without sig. murmurs, gallops, clicks or rubs. Peripheral pulses normal and equal  without edema.  Abdomen: Soft & bowel sounds normal. Non-tender w/o guarding, rebound, hernias, masses or organomegaly.  Lymphatics: Unremarkable.  Musculoskeletal: Full ROM all peripheral extremities, joint stability, 5/5 strength and normal gait.  Skin: Warm, dry without  exposed rashes, lesions or ecchymosis apparent.  Neuro: Cranial nerves intact, reflexes equal bilaterally. Sensory-motor testing grossly intact. Tendon reflexes grossly intact.  Pysch: Alert & oriented x 3.  Insight and judgement nl & appropriate. No ideations.  Assessment and Plan:  1. Essential hypertension  - Continue medication, monitor blood pressure at home.  - Continue DASH diet.  Reminder to go to the ER if any CP,  SOB, nausea, dizziness, severe HA, changes vision/speech.  - CBC with Differential/Platelet - COMPLETE METABOLIC PANEL WITH GFR - Magnesium - TSH  2. Hyperlipidemia associated with type 2 diabetes mellitus ([place])  - Continue diet/meds, exercise,& lifestyle modifications.  - Continue monitor periodic cholesterol/liver & renal functions   - Lipid panel - TSH  3. Type 2 diabetes mellitus with stage 4 chronic kidney disease,  with long-term current use of insulin ([facility])  - Continue diet, exercise  - Lifestyle modifications.  - Monitor appropriate labs.  - Hemoglobin A1c  4. Vitamin D deficiency  - Continue supplementation.  - VITAMIN D 25 Hydroxy  5. Hypothyroidism  - TSH  6. Seizure disorder ([facility])  - Levetiracetam level  7. Type II diabetes mellitus with neurological manifestations ([facility])  - Hemoglobin A1c  8. Medication management  - CBC with Differential/Platelet - COMPLETE METABOLIC PANEL WITH GFR - Magnesium - Lipid panel - TSH - Hemoglobin A1c - VITAMIN D 25 Hydroxy - Levetiracetam level        Discussed  regular exercise, BP monitoring, weight control to achieve/maintain BMI less than 25 and discussed med and SE's. Recommended labs to assess and monitor clinical status with further disposition pending results of labs.  I discussed the assessment and treatment plan with the patient. The patient was provided an opportunity to ask questions and all were answered. The patient agreed with the plan and demonstrated an understanding of the  instructions.  I provided over 30 minutes of exam, counseling, chart review and  complex critical decision making.   Kirtland Bouchard,  MD  To prevent harm (release of this note would result in harm to the life or physical safety of the patient or another).

## 2019-04-04 NOTE — Patient Instructions (Signed)

## 2019-04-05 ENCOUNTER — Other Ambulatory Visit: Payer: Self-pay

## 2019-04-05 ENCOUNTER — Ambulatory Visit (INDEPENDENT_AMBULATORY_CARE_PROVIDER_SITE_OTHER): Payer: Medicare Other | Admitting: Internal Medicine

## 2019-04-05 ENCOUNTER — Other Ambulatory Visit: Payer: Self-pay | Admitting: *Deleted

## 2019-04-05 VITALS — BP 134/60 | HR 84 | Temp 97.2°F | Resp 16 | Ht 62.0 in | Wt 208.0 lb

## 2019-04-05 DIAGNOSIS — G2581 Restless legs syndrome: Secondary | ICD-10-CM

## 2019-04-05 DIAGNOSIS — E039 Hypothyroidism, unspecified: Secondary | ICD-10-CM

## 2019-04-05 DIAGNOSIS — E785 Hyperlipidemia, unspecified: Secondary | ICD-10-CM

## 2019-04-05 DIAGNOSIS — I10 Essential (primary) hypertension: Secondary | ICD-10-CM | POA: Diagnosis not present

## 2019-04-05 DIAGNOSIS — Z79899 Other long term (current) drug therapy: Secondary | ICD-10-CM

## 2019-04-05 DIAGNOSIS — E1169 Type 2 diabetes mellitus with other specified complication: Secondary | ICD-10-CM

## 2019-04-05 DIAGNOSIS — N184 Chronic kidney disease, stage 4 (severe): Secondary | ICD-10-CM

## 2019-04-05 DIAGNOSIS — E559 Vitamin D deficiency, unspecified: Secondary | ICD-10-CM | POA: Diagnosis not present

## 2019-04-05 DIAGNOSIS — Z794 Long term (current) use of insulin: Secondary | ICD-10-CM

## 2019-04-05 DIAGNOSIS — E1122 Type 2 diabetes mellitus with diabetic chronic kidney disease: Secondary | ICD-10-CM | POA: Diagnosis not present

## 2019-04-05 DIAGNOSIS — G40909 Epilepsy, unspecified, not intractable, without status epilepticus: Secondary | ICD-10-CM

## 2019-04-05 DIAGNOSIS — E1149 Type 2 diabetes mellitus with other diabetic neurological complication: Secondary | ICD-10-CM

## 2019-04-05 MED ORDER — ROPINIROLE HCL 1 MG PO TABS: ORAL_TABLET | ORAL | 3 refills | Status: DC

## 2019-04-05 MED ORDER — ONETOUCH VERIO VI STRP: ORAL_STRIP | 4 refills | Status: DC

## 2019-04-06 ENCOUNTER — Other Ambulatory Visit: Payer: Self-pay | Admitting: Internal Medicine

## 2019-04-06 DIAGNOSIS — D631 Anemia in chronic kidney disease: Secondary | ICD-10-CM

## 2019-04-06 DIAGNOSIS — N184 Chronic kidney disease, stage 4 (severe): Secondary | ICD-10-CM

## 2019-04-06 DIAGNOSIS — Z79899 Other long term (current) drug therapy: Secondary | ICD-10-CM

## 2019-04-06 DIAGNOSIS — N189 Chronic kidney disease, unspecified: Secondary | ICD-10-CM

## 2019-04-06 DIAGNOSIS — E1122 Type 2 diabetes mellitus with diabetic chronic kidney disease: Secondary | ICD-10-CM

## 2019-04-07 ENCOUNTER — Telehealth: Payer: Self-pay | Admitting: Hematology

## 2019-04-07 NOTE — Telephone Encounter (Signed)
Received a new hem referral from Dr. Melford Aase for Anemia of Chronic Kidney Disease - may need EPO shots. Ms [name] [name] has been cld and scheduled to see Dr. Irene Limbo on 3/15 at 10am. Pt aware to arrive 15 minutes early.

## 2019-04-08 LAB — COMPLETE METABOLIC PANEL WITH GFR
AG Ratio: 1.9 (calc) (ref 1.0–2.5)
ALT: 15 U/L (ref 6–29)
AST: 14 U/L (ref 10–35)
Albumin: 4.2 g/dL (ref 3.6–5.1)
Alkaline phosphatase (APISO): 43 U/L (ref 37–153)
BUN/Creatinine Ratio: 23 (calc) — ABNORMAL HIGH (ref 6–22)
BUN: 51 mg/dL — ABNORMAL HIGH (ref 7–25)
CO2: 21 mmol/L (ref 20–32)
Calcium: 9.7 mg/dL (ref 8.6–10.4)
Chloride: 114 mmol/L — ABNORMAL HIGH (ref 98–110)
Creat: 2.23 mg/dL — ABNORMAL HIGH (ref 0.60–0.88)
GFR, Est African American: 23 mL/min/{1.73_m2} — ABNORMAL LOW (ref >=60)
GFR, Est Non African American: 20 mL/min/{1.73_m2} — ABNORMAL LOW (ref >=60)
Globulin: 2.2 g/dL (calc) (ref 1.9–3.7)
Glucose, Bld: 108 mg/dL — ABNORMAL HIGH (ref 65–99)
Potassium: 4.6 mmol/L (ref 3.5–5.3)
Sodium: 143 mmol/L (ref 135–146)
Total Bilirubin: 0.4 mg/dL (ref 0.2–1.2)
Total Protein: 6.4 g/dL (ref 6.1–8.1)

## 2019-04-08 LAB — LIPID PANEL
Cholesterol: 86 mg/dL (ref <200)
HDL: 24 mg/dL — ABNORMAL LOW (ref >=50)
LDL Cholesterol (Calc): 42 mg/dL (calc)
Non-HDL Cholesterol (Calc): 62 mg/dL (calc) (ref <130)
Total CHOL/HDL Ratio: 3.6 (calc) (ref <5.0)
Triglycerides: 112 mg/dL (ref <150)

## 2019-04-08 LAB — CBC WITH DIFFERENTIAL/PLATELET
Absolute Monocytes: 456 cells/uL (ref 200–950)
Basophils Absolute: 20 cells/uL (ref 0–200)
Basophils Relative: 0.3 %
Eosinophils Absolute: 456 cells/uL (ref 15–500)
Eosinophils Relative: 6.7 %
HCT: 25.8 % — ABNORMAL LOW (ref 35.0–45.0)
Hemoglobin: 7.8 g/dL — ABNORMAL LOW (ref 11.7–15.5)
Lymphs Abs: 694 cells/uL — ABNORMAL LOW (ref 850–3900)
MCH: 26.6 pg — ABNORMAL LOW (ref 27.0–33.0)
MCHC: 30.2 g/dL — ABNORMAL LOW (ref 32.0–36.0)
MCV: 88.1 fL (ref 80.0–100.0)
MPV: 10.9 fL (ref 7.5–12.5)
Monocytes Relative: 6.7 %
Neutro Abs: 5175 cells/uL (ref 1500–7800)
Neutrophils Relative %: 76.1 %
Platelets: 424 10*3/uL — ABNORMAL HIGH (ref 140–400)
RBC: 2.93 10*6/uL — ABNORMAL LOW (ref 3.80–5.10)
RDW: 19.7 % — ABNORMAL HIGH (ref 11.0–15.0)
Total Lymphocyte: 10.2 %
WBC: 6.8 10*3/uL (ref 3.8–10.8)

## 2019-04-08 LAB — HEMOGLOBIN A1C
Hgb A1c MFr Bld: 5.6 % of total Hgb (ref <5.7)
Mean Plasma Glucose: 114 (calc)
eAG (mmol/L): 6.3 (calc)

## 2019-04-08 LAB — MAGNESIUM: Magnesium: 2.4 mg/dL (ref 1.5–2.5)

## 2019-04-08 LAB — TSH: TSH: 1.28 mIU/L (ref 0.40–4.50)

## 2019-04-08 LAB — VITAMIN D 25 HYDROXY (VIT D DEFICIENCY, FRACTURES): Vit D, 25-Hydroxy: 39 ng/mL (ref 30–100)

## 2019-04-08 LAB — LEVETIRACETAM LEVEL: Keppra (Levetiracetam): 45 ug/mL (ref 12.0–46.0)

## 2019-04-18 NOTE — Progress Notes (Signed)
Subjective:    Patient ID: [name] [name], female    DOB: [birth year], 86 y.o.   MRN: [id number]  HPI     This nice 86 yo WWF with HTN, Hypothyroidism, T2_IDDM w/ CKD4 / PSN  and Vitamin D Deficiency returns for close f/u of worsening Anemia presumed due to her CKD.  Patient is followed closely by her  Nephrologist Dr Harrie Jeans. Patient is on bid Novolin 70/30 for her DM.       2 weeks ago she was noted to have a drop in Hgb from 10.4 gm% (02/22/2019) to Hgb 7.8% (02.15.2021)  and  kidney functions had likewise dropped from creat 1.90 / GFR 24 to creat 2.23  / GFR 20.   Folate & Vit B12 levels were Normal in January. Iron studies & Ferritin were normal last November 2020  before the precipitous drop in Hgb.  Patient is unaware of any blood in BM's .  Patient has up coming appt with Dr Irene Limbo on 15 Mar.   Medication Sig  . acetaminophen  500 MG tablet Take 1,000 mg every 6 (six) hours as needed   . aspirin 81 MG tablet Take  daily.  . Atenolol  50 MG tablet Take 1 tablet daily  . VIT D3  1000 UNIT  Take 1,000 Units by mouth daily  . SLOW FE 160 (50 FE) MG  Take 160 mg by mouth 2 (two) times daily.   . fish oil-omega-3 fatty acids 1000 MG  Take 2 g by mouth at bedtime.   Marland Kitchen FLAX SEED OIL 1000 MG CAPS Take 1 capsule  3  times daily.  . furosemide (LASIX) 40 MG tablet Take 40 mg by mouth as needed. )  . NOVOLIN 70/30 inj Takes 28 units qam and 25 units qpm  . lansoprazole  30 MG  Take 1 capsule Daily for Indigestion & Heartburn  . levETIRAcetam (KEPPRA) 500 MG  Take 1 tablet 3 x /day after Meals to Prevent Seizures  . levothyroxine 100 MCG tablet TAKE 1 TABLET BY MOUTH DAILY  . loratadine (CLARITIN) 10 MG tablet Take 10 mg by mouth daily.  . meclizine (ANTIVERT) 25 MG tablet Take 1 tablet up yo 3 x / day if needed  . rOPINIRole (REQUIP) 1 MG tablet Take 1/2 to 1 tablet 3 x /day as needed for Restless Legs   . rosuvastatin (CRESTOR) 5 MG tablet Take 1 tablet once weekly for Cholesterol    . vitamin B-12  100 MCG tablet Take 500 mcg by mouth daily.   . vitamin C  500 MG tablet Take 1,000 mg by mouth 3 (three) times daily.    Allergies  Allergen Reactions  . Ppd [Tuberculin Purified Protein Derivative] Other (See Comments)    indurated   Past Medical History:  Diagnosis Date  . Anemia, unspecified   . Arthritis    "knees; right shoulder" (09/15/2013)  . Basal cell carcinoma    "right upper outer lip"  . DM neuropathy, type II diabetes mellitus ([place])   . Epilepsy ([place])   . GERD (gastroesophageal reflux disease)   . Gout   . High cholesterol   . Hypertension   . Hypothyroidism   . Meniere's disease   . Obesity (BMI 30-39.9)   . Other forms of epilepsy and recurrent seizures without mention of intractable epilepsy 11/04/2012   Non convulsive paroxysmal spells, responding to [facility], patient not driving.   . Pneumonia ~ 1943  . Skin cancer    "  forehead; right hand"  . Type II or unspecified type diabetes mellitus without mention of complication, not stated as uncontrolled   . UTI (urinary tract infection)   . Vitamin D deficiency    Past Surgical History:  Procedure Laterality Date  . CERVICAL POLYPECTOMY    . HAMMER TOE SURGERY Left 1980's  . MOHS SURGERY  20087   "right upper outer lip"  . TONSILLECTOMY  ~ 1947  . WRIST SURGERY Left ~ 1950   "ran arm thru window"   Review of Systems   10 point systems review negative except as above.    Objective:   Physical Exam  BP 138/64   P 80   T 97.5 F   R 16   Ht 5\' 2"   Wt 207 lb 12.8 oz (94.3 kg)   BMI 38.01  Postural Sitting     BP 14/62     P 78                  &                Standing   BP           151/71      P 80  HEENT - WNL. Neck - supple.  Chest - Clear equal BS. Cor - Nl HS. RRR w/o sig MGR. PP 1(+). No edema. Abd - Soft, Rotund.  MS- FROM w/o deformities.  Gait Nl. Neuro -  Nl w/o focal abnormalities. Sensory decreased in a stocking distribution    Assessment & Plan:   1. Type 2  diabetes mellitus with stage 4 chronic kidney disease, with long-term current use of insulin ([place])  2. Anemia associated with chronic renal failure  - CBC with Differential/Platelet - Reticulocytes  3. Iron deficiency anemia  - CBC with Differential/Platelet - Ferritin - Iron,Total/Total Iron Binding Cap - Reticulocytes  4. Medication management  - CBC with Differential/Platelet - Ferritin - Iron,Total/Total Iron Binding Cap - Reticulocytes

## 2019-04-19 ENCOUNTER — Other Ambulatory Visit: Payer: Self-pay

## 2019-04-19 ENCOUNTER — Encounter: Payer: Self-pay | Admitting: Internal Medicine

## 2019-04-19 ENCOUNTER — Ambulatory Visit: Payer: Medicare Other | Admitting: Internal Medicine

## 2019-04-19 VITALS — BP 138/64 | HR 80 | Temp 97.5°F | Resp 16 | Ht 62.0 in | Wt 207.8 lb

## 2019-04-19 DIAGNOSIS — D509 Iron deficiency anemia, unspecified: Secondary | ICD-10-CM | POA: Diagnosis not present

## 2019-04-19 DIAGNOSIS — E1122 Type 2 diabetes mellitus with diabetic chronic kidney disease: Secondary | ICD-10-CM | POA: Diagnosis not present

## 2019-04-19 DIAGNOSIS — N189 Chronic kidney disease, unspecified: Secondary | ICD-10-CM | POA: Diagnosis not present

## 2019-04-19 DIAGNOSIS — Z79899 Other long term (current) drug therapy: Secondary | ICD-10-CM

## 2019-04-19 DIAGNOSIS — D631 Anemia in chronic kidney disease: Secondary | ICD-10-CM

## 2019-04-19 DIAGNOSIS — N184 Chronic kidney disease, stage 4 (severe): Secondary | ICD-10-CM

## 2019-04-19 DIAGNOSIS — Z794 Long term (current) use of insulin: Secondary | ICD-10-CM

## 2019-04-19 LAB — IRON, TOTAL/TOTAL IRON BINDING CAP
%SAT: 12 % (calc) — ABNORMAL LOW (ref 16–45)
Iron: 33 ug/dL — ABNORMAL LOW (ref 45–160)
TIBC: 274 mcg/dL (calc) (ref 250–450)

## 2019-04-19 LAB — CBC WITH DIFFERENTIAL/PLATELET
Absolute Monocytes: 420 cells/uL (ref 200–950)
Basophils Absolute: 18 cells/uL (ref 0–200)
Basophils Relative: 0.3 %
Eosinophils Absolute: 270 cells/uL (ref 15–500)
Eosinophils Relative: 4.5 %
HCT: 28 % — ABNORMAL LOW (ref 35.0–45.0)
Hemoglobin: 7.9 g/dL — ABNORMAL LOW (ref 11.7–15.5)
Lymphs Abs: 732 cells/uL — ABNORMAL LOW (ref 850–3900)
MCH: 25.2 pg — ABNORMAL LOW (ref 27.0–33.0)
MCHC: 28.2 g/dL — ABNORMAL LOW (ref 32.0–36.0)
MCV: 89.2 fL (ref 80.0–100.0)
MPV: 11.1 fL (ref 7.5–12.5)
Monocytes Relative: 7 %
Neutro Abs: 4560 cells/uL (ref 1500–7800)
Neutrophils Relative %: 76 %
Platelets: 423 10*3/uL — ABNORMAL HIGH (ref 140–400)
RBC: 3.14 10*6/uL — ABNORMAL LOW (ref 3.80–5.10)
RDW: 20.5 % — ABNORMAL HIGH (ref 11.0–15.0)
Total Lymphocyte: 12.2 %
WBC: 6 10*3/uL (ref 3.8–10.8)

## 2019-04-19 LAB — FERRITIN: Ferritin: 51 ng/mL (ref 16–288)

## 2019-04-19 LAB — RETICULOCYTES
ABS Retic: 210380 cells/uL — ABNORMAL HIGH (ref 20000–8000)
Retic Ct Pct: 6.7 %

## 2019-04-20 LAB — BASIC METABOLIC PANEL WITH GFR
BUN/Creatinine Ratio: 20 (calc) (ref 6–22)
BUN: 39 mg/dL — ABNORMAL HIGH (ref 7–25)
CO2: 21 mmol/L (ref 20–32)
Calcium: 9.8 mg/dL (ref 8.6–10.4)
Chloride: 116 mmol/L — ABNORMAL HIGH (ref 98–110)
Creat: 1.95 mg/dL — ABNORMAL HIGH (ref 0.60–0.88)
GFR, Est African American: 27 mL/min/{1.73_m2} — ABNORMAL LOW (ref >=60)
GFR, Est Non African American: 23 mL/min/{1.73_m2} — ABNORMAL LOW (ref >=60)
Glucose, Bld: 108 mg/dL — ABNORMAL HIGH (ref 65–99)
Potassium: 4.4 mmol/L (ref 3.5–5.3)
Sodium: 145 mmol/L (ref 135–146)

## 2019-04-23 NOTE — Discharge Instructions (Signed)

## 2019-04-26 ENCOUNTER — Inpatient Hospital Stay (HOSPITAL_COMMUNITY)
Admission: RE | Admit: 2019-04-26 | Discharge: 2019-04-26 | Disposition: A | Discharge status: Home / Self Care | Payer: Medicare Other | Source: Ambulatory Visit | Attending: Nephrology | Admitting: Nephrology

## 2019-04-26 ENCOUNTER — Encounter (HOSPITAL_COMMUNITY): Payer: Self-pay

## 2019-05-03 ENCOUNTER — Other Ambulatory Visit: Payer: Self-pay

## 2019-05-03 ENCOUNTER — Inpatient Hospital Stay: Payer: Medicare Other

## 2019-05-03 ENCOUNTER — Inpatient Hospital Stay: Payer: Medicare Other | Attending: Hematology | Admitting: Hematology

## 2019-05-03 VITALS — BP 139/42 | HR 80 | Temp 97.8°F | Resp 18 | Ht 62.0 in | Wt 201.2 lb

## 2019-05-03 DIAGNOSIS — N184 Chronic kidney disease, stage 4 (severe): Secondary | ICD-10-CM

## 2019-05-03 DIAGNOSIS — G40909 Epilepsy, unspecified, not intractable, without status epilepticus: Secondary | ICD-10-CM

## 2019-05-03 DIAGNOSIS — N189 Chronic kidney disease, unspecified: Secondary | ICD-10-CM | POA: Insufficient documentation

## 2019-05-03 DIAGNOSIS — D631 Anemia in chronic kidney disease: Secondary | ICD-10-CM | POA: Diagnosis present

## 2019-05-03 DIAGNOSIS — I129 Hypertensive chronic kidney disease with stage 1 through stage 4 chronic kidney disease, or unspecified chronic kidney disease: Secondary | ICD-10-CM

## 2019-05-03 DIAGNOSIS — E039 Hypothyroidism, unspecified: Secondary | ICD-10-CM

## 2019-05-03 DIAGNOSIS — D509 Iron deficiency anemia, unspecified: Secondary | ICD-10-CM

## 2019-05-03 DIAGNOSIS — E1122 Type 2 diabetes mellitus with diabetic chronic kidney disease: Secondary | ICD-10-CM | POA: Insufficient documentation

## 2019-05-03 LAB — CBC WITH DIFFERENTIAL/PLATELET
Abs Immature Granulocytes: 0.02 10*3/uL (ref 0.00–0.07)
Basophils Absolute: 0 10*3/uL (ref 0.0–0.1)
Basophils Relative: 1 %
Eosinophils Absolute: 0.4 10*3/uL (ref 0.0–0.5)
Eosinophils Relative: 7 %
HCT: 36.8 % (ref 36.0–46.0)
Hemoglobin: 10.3 g/dL — ABNORMAL LOW (ref 12.0–15.0)
Immature Granulocytes: 0 %
Lymphocytes Relative: 13 %
Lymphs Abs: 0.7 10*3/uL (ref 0.7–4.0)
MCH: 25.7 pg — ABNORMAL LOW (ref 26.0–34.0)
MCHC: 28 g/dL — ABNORMAL LOW (ref 30.0–36.0)
MCV: 91.8 fL (ref 80.0–100.0)
Monocytes Absolute: 0.5 10*3/uL (ref 0.1–1.0)
Monocytes Relative: 9 %
Neutro Abs: 4.1 10*3/uL (ref 1.7–7.7)
Neutrophils Relative %: 70 %
Platelets: 402 10*3/uL — ABNORMAL HIGH (ref 150–400)
RBC: 4.01 MIL/uL (ref 3.87–5.11)
RDW: 20.4 % — ABNORMAL HIGH (ref 11.5–15.5)
WBC: 5.8 10*3/uL (ref 4.0–10.5)
nRBC: 0 % (ref 0.0–0.2)

## 2019-05-03 LAB — CMP (CANCER CENTER ONLY)
ALT: 12 U/L (ref 0–44)
AST: 17 U/L (ref 15–41)
Albumin: 4 g/dL (ref 3.5–5.0)
Alkaline Phosphatase: 47 U/L (ref 38–126)
Anion gap: 11 (ref 5–15)
BUN: 35 mg/dL — ABNORMAL HIGH (ref 8–23)
CO2: 23 mmol/L (ref 22–32)
Calcium: 9.5 mg/dL (ref 8.9–10.3)
Chloride: 110 mmol/L (ref 98–111)
Creatinine: 1.96 mg/dL — ABNORMAL HIGH (ref 0.44–1.00)
GFR, Est AFR Am: 27 mL/min — ABNORMAL LOW (ref >60)
GFR, Estimated: 23 mL/min — ABNORMAL LOW (ref >60)
Glucose, Bld: 220 mg/dL — ABNORMAL HIGH (ref 70–99)
Potassium: 4.6 mmol/L (ref 3.5–5.1)
Sodium: 144 mmol/L (ref 135–145)
Total Bilirubin: 0.3 mg/dL (ref 0.3–1.2)
Total Protein: 6.4 g/dL — ABNORMAL LOW (ref 6.5–8.1)

## 2019-05-03 LAB — SEDIMENTATION RATE: Sed Rate: 0 mm/hr (ref 0–22)

## 2019-05-03 LAB — SAMPLE TO BLOOD BANK

## 2019-05-03 LAB — LACTATE DEHYDROGENASE: LDH: 279 U/L — ABNORMAL HIGH (ref 98–192)

## 2019-05-03 LAB — FERRITIN: Ferritin: 61 ng/mL (ref 11–307)

## 2019-05-03 NOTE — Patient Instructions (Signed)
Thank you for choosing [facility] to provide your oncology and hematology care.   Should you have questions after your visit to the [facility] ([facility]), please contact this office at [phone] between 8:30 AM and 4:30 PM.  Voice mails left after 4:00 PM may not be returned until the following business day.  Calls received after 4:30 PM will be answered by an off-site Nurse Triage Line.    Prescription Refills:  Please have your pharmacy contact us directly for most prescription requests.  Contact the office directly for refills of narcotics (pain medications). Allow 48-72 hours for refills.  Appointments: Please contact the [facility] scheduling department [phone] for questions regarding [facility] appointment scheduling.  Contact the schedulers with any scheduling changes so that your appointment can be rescheduled in a timely manner.   Central Scheduling for [place] [phone] - Call to schedule procedures such as PET scans, CT scans, MRI, Ultrasound, etc.  To afford each patient quality time with our providers, please arrive 30 minutes before your scheduled appointment time.  If you arrive late for your appointment, you may be asked to reschedule.  We strive to give you quality time with our providers, and arriving late affects you and other patients whose appointments are after yours. If you are a no show for multiple scheduled visits, you may be dismissed from the clinic at the providers discretion.     Resources: [facility] Social Workers [phone] for additional information on assistance programs or assistance connecting with community support programs   [place] DSS  [phone]: Information regarding food stamps, Medicaid, and utility assistance [place] [phone]   [place] [employer]'s shared-ride transportation service for eligible riders who have a disability that prevents them from riding the fixed route bus.   Medicare Rights Center  800-333-4114 Helps people with Medicare understand their rights and benefits, navigate the Medicare system, and secure the quality healthcare they deserve [employer] 800-227-2345 Assists patients locate various types of support and financial assistance Cancer Care: 1-800-813-HOPE (4673) Provides financial assistance, online support groups, medication/co-pay assistance.   Transportation Assistance for appointments at [facility]: Transportation Coordinator [phone]  Again, thank you for choosing [facility] for your care.       
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
## 2019-05-04 LAB — ERYTHROPOIETIN: Erythropoietin: 11.1 m[IU]/mL (ref 2.6–18.5)

## 2019-05-04 LAB — HAPTOGLOBIN: Haptoglobin: 77 mg/dL (ref 41–333)

## 2019-05-05 ENCOUNTER — Other Ambulatory Visit: Payer: Self-pay

## 2019-05-05 DIAGNOSIS — L97511 Non-pressure chronic ulcer of other part of right foot limited to breakdown of skin: Secondary | ICD-10-CM

## 2019-05-05 MED ORDER — FUROSEMIDE 40 MG PO TABS: ORAL_TABLET | ORAL | 1 refills | Status: DC

## 2019-05-06 LAB — MULTIPLE MYELOMA PANEL, SERUM
Albumin SerPl Elph-Mcnc: 3.9 g/dL (ref 2.9–4.4)
Albumin/Glob SerPl: 1.7 (ref 0.7–1.7)
Alpha 1: 0.2 g/dL (ref 0.0–0.4)
Alpha2 Glob SerPl Elph-Mcnc: 0.6 g/dL (ref 0.4–1.0)
B-Globulin SerPl Elph-Mcnc: 0.8 g/dL (ref 0.7–1.3)
Gamma Glob SerPl Elph-Mcnc: 0.9 g/dL (ref 0.4–1.8)
Globulin, Total: 2.4 g/dL (ref 2.2–3.9)
IgA: 161 mg/dL (ref 64–422)
IgG (Immunoglobin G), Serum: 822 mg/dL (ref 586–1602)
IgM (Immunoglobulin M), Srm: 43 mg/dL (ref 26–217)
Total Protein ELP: 6.3 g/dL (ref 6.0–8.5)

## 2019-05-10 ENCOUNTER — Other Ambulatory Visit: Payer: Self-pay

## 2019-05-10 ENCOUNTER — Encounter (HOSPITAL_COMMUNITY): Payer: Medicare Other

## 2019-05-10 ENCOUNTER — Other Ambulatory Visit: Payer: Self-pay | Admitting: Hematology

## 2019-05-10 ENCOUNTER — Inpatient Hospital Stay: Payer: Medicare Other

## 2019-05-10 VITALS — BP 141/40 | HR 73 | Temp 98.5°F | Resp 18 | Wt 201.5 lb

## 2019-05-10 DIAGNOSIS — N189 Chronic kidney disease, unspecified: Secondary | ICD-10-CM | POA: Diagnosis not present

## 2019-05-10 DIAGNOSIS — D631 Anemia in chronic kidney disease: Secondary | ICD-10-CM

## 2019-05-10 DIAGNOSIS — N184 Chronic kidney disease, stage 4 (severe): Secondary | ICD-10-CM

## 2019-05-10 DIAGNOSIS — D508 Other iron deficiency anemias: Secondary | ICD-10-CM

## 2019-05-10 DIAGNOSIS — D509 Iron deficiency anemia, unspecified: Secondary | ICD-10-CM | POA: Insufficient documentation

## 2019-05-10 MED ORDER — LORATADINE 10 MG PO TABS: 10.0000 mg | ORAL_TABLET | Freq: Once | ORAL | Status: DC

## 2019-05-10 MED ORDER — ACETAMINOPHEN 325 MG PO TABS
ORAL_TABLET | ORAL | Status: AC
  Filled 2019-05-10: qty 2

## 2019-05-10 MED ORDER — SODIUM CHLORIDE 0.9 % IV SOLN
Freq: Once | INTRAVENOUS | Status: AC
  Filled 2019-05-10: qty 250

## 2019-05-10 MED ORDER — ACETAMINOPHEN 325 MG PO TABS
650.0000 mg | ORAL_TABLET | Freq: Once | ORAL | Status: AC
  Administered 2019-05-10: 650 mg via ORAL

## 2019-05-10 MED ORDER — SODIUM CHLORIDE 0.9 % IV SOLN
750.0000 mg | Freq: Once | INTRAVENOUS | Status: AC
  Administered 2019-05-10: 11:00:00 750 mg via INTRAVENOUS
  Filled 2019-05-10: qty 15

## 2019-05-10 NOTE — Progress Notes (Signed)
No auth required for [employer] per [employer].   Demetrius Charity, PharmD, BCPS, [place] [facility] Pharmacist Pharmacy Phone: [phone] 05/10/2019

## 2019-05-10 NOTE — Patient Instructions (Signed)

## 2019-05-17 ENCOUNTER — Inpatient Hospital Stay: Payer: Medicare Other

## 2019-05-17 ENCOUNTER — Other Ambulatory Visit: Payer: Self-pay

## 2019-05-17 VITALS — BP 130/53 | HR 69 | Temp 97.9°F | Resp 17

## 2019-05-17 DIAGNOSIS — D631 Anemia in chronic kidney disease: Secondary | ICD-10-CM

## 2019-05-17 DIAGNOSIS — N189 Chronic kidney disease, unspecified: Secondary | ICD-10-CM | POA: Diagnosis not present

## 2019-05-17 DIAGNOSIS — D509 Iron deficiency anemia, unspecified: Secondary | ICD-10-CM

## 2019-05-17 DIAGNOSIS — D508 Other iron deficiency anemias: Secondary | ICD-10-CM

## 2019-05-17 LAB — CBC WITH DIFFERENTIAL/PLATELET
Abs Immature Granulocytes: 0.03 10*3/uL (ref 0.00–0.07)
Basophils Absolute: 0 10*3/uL (ref 0.0–0.1)
Basophils Relative: 0 %
Eosinophils Absolute: 0.6 10*3/uL — ABNORMAL HIGH (ref 0.0–0.5)
Eosinophils Relative: 8 %
HCT: 36.7 % (ref 36.0–46.0)
Hemoglobin: 10.9 g/dL — ABNORMAL LOW (ref 12.0–15.0)
Immature Granulocytes: 0 %
Lymphocytes Relative: 12 %
Lymphs Abs: 0.8 10*3/uL (ref 0.7–4.0)
MCH: 26.1 pg (ref 26.0–34.0)
MCHC: 29.7 g/dL — ABNORMAL LOW (ref 30.0–36.0)
MCV: 88 fL (ref 80.0–100.0)
Monocytes Absolute: 0.4 10*3/uL (ref 0.1–1.0)
Monocytes Relative: 6 %
Neutro Abs: 5.1 10*3/uL (ref 1.7–7.7)
Neutrophils Relative %: 74 %
Platelets: 467 10*3/uL — ABNORMAL HIGH (ref 150–400)
RBC: 4.17 MIL/uL (ref 3.87–5.11)
RDW: 20.3 % — ABNORMAL HIGH (ref 11.5–15.5)
WBC: 6.9 10*3/uL (ref 4.0–10.5)
nRBC: 0 % (ref 0.0–0.2)

## 2019-05-17 LAB — CMP (CANCER CENTER ONLY)
ALT: 14 U/L (ref 0–44)
AST: 14 U/L — ABNORMAL LOW (ref 15–41)
Albumin: 3.9 g/dL (ref 3.5–5.0)
Alkaline Phosphatase: 57 U/L (ref 38–126)
Anion gap: 10 (ref 5–15)
BUN: 42 mg/dL — ABNORMAL HIGH (ref 8–23)
CO2: 19 mmol/L — ABNORMAL LOW (ref 22–32)
Calcium: 9.3 mg/dL (ref 8.9–10.3)
Chloride: 114 mmol/L — ABNORMAL HIGH (ref 98–111)
Creatinine: 1.99 mg/dL — ABNORMAL HIGH (ref 0.44–1.00)
GFR, Est AFR Am: 26 mL/min — ABNORMAL LOW (ref >60)
GFR, Estimated: 22 mL/min — ABNORMAL LOW (ref >60)
Glucose, Bld: 194 mg/dL — ABNORMAL HIGH (ref 70–99)
Potassium: 4.2 mmol/L (ref 3.5–5.1)
Sodium: 143 mmol/L (ref 135–145)
Total Bilirubin: 0.4 mg/dL (ref 0.3–1.2)
Total Protein: 6.6 g/dL (ref 6.5–8.1)

## 2019-05-17 LAB — FERRITIN: Ferritin: 855 ng/mL — ABNORMAL HIGH (ref 11–307)

## 2019-05-17 MED ORDER — SODIUM CHLORIDE 0.9 % IV SOLN
Freq: Once | INTRAVENOUS | Status: AC
  Filled 2019-05-17: qty 250

## 2019-05-17 MED ORDER — ACETAMINOPHEN 325 MG PO TABS
ORAL_TABLET | ORAL | Status: AC
  Filled 2019-05-17: qty 2

## 2019-05-17 MED ORDER — LORATADINE 10 MG PO TABS: 10.0000 mg | ORAL_TABLET | Freq: Once | ORAL | Status: DC

## 2019-05-17 MED ORDER — SODIUM CHLORIDE 0.9 % IV SOLN
750.0000 mg | Freq: Once | INTRAVENOUS | Status: AC
  Administered 2019-05-17: 750 mg via INTRAVENOUS
  Filled 2019-05-17: qty 15

## 2019-05-17 MED ORDER — ACETAMINOPHEN 325 MG PO TABS
650.0000 mg | ORAL_TABLET | Freq: Once | ORAL | Status: AC
  Administered 2019-05-17: 13:00:00 650 mg via ORAL

## 2019-05-17 NOTE — Patient Instructions (Signed)

## 2019-06-29 ENCOUNTER — Other Ambulatory Visit: Payer: Self-pay | Admitting: *Deleted

## 2019-06-29 DIAGNOSIS — D631 Anemia in chronic kidney disease: Secondary | ICD-10-CM

## 2019-06-30 ENCOUNTER — Ambulatory Visit: Payer: Medicare Other | Admitting: Hematology

## 2019-06-30 ENCOUNTER — Inpatient Hospital Stay: Payer: Medicare Other | Attending: Hematology

## 2019-06-30 ENCOUNTER — Inpatient Hospital Stay: Payer: Medicare Other | Admitting: Hematology

## 2019-06-30 ENCOUNTER — Other Ambulatory Visit: Payer: Medicare Other

## 2019-06-30 ENCOUNTER — Inpatient Hospital Stay: Payer: Medicare Other

## 2019-06-30 ENCOUNTER — Other Ambulatory Visit: Payer: Self-pay

## 2019-06-30 ENCOUNTER — Ambulatory Visit: Payer: Medicare Other

## 2019-06-30 VITALS — BP 121/36 | HR 66 | Temp 97.8°F | Resp 17

## 2019-06-30 VITALS — BP 120/41 | HR 83 | Temp 99.1°F | Resp 18 | Wt 204.6 lb

## 2019-06-30 DIAGNOSIS — N184 Chronic kidney disease, stage 4 (severe): Secondary | ICD-10-CM | POA: Diagnosis not present

## 2019-06-30 DIAGNOSIS — D631 Anemia in chronic kidney disease: Secondary | ICD-10-CM | POA: Diagnosis present

## 2019-06-30 DIAGNOSIS — R531 Weakness: Secondary | ICD-10-CM | POA: Diagnosis not present

## 2019-06-30 DIAGNOSIS — E1122 Type 2 diabetes mellitus with diabetic chronic kidney disease: Secondary | ICD-10-CM | POA: Insufficient documentation

## 2019-06-30 DIAGNOSIS — K5900 Constipation, unspecified: Secondary | ICD-10-CM | POA: Diagnosis not present

## 2019-06-30 DIAGNOSIS — E039 Hypothyroidism, unspecified: Secondary | ICD-10-CM | POA: Insufficient documentation

## 2019-06-30 DIAGNOSIS — D508 Other iron deficiency anemias: Secondary | ICD-10-CM

## 2019-06-30 DIAGNOSIS — N189 Chronic kidney disease, unspecified: Secondary | ICD-10-CM | POA: Insufficient documentation

## 2019-06-30 DIAGNOSIS — R5383 Other fatigue: Secondary | ICD-10-CM | POA: Insufficient documentation

## 2019-06-30 DIAGNOSIS — R42 Dizziness and giddiness: Secondary | ICD-10-CM | POA: Diagnosis not present

## 2019-06-30 DIAGNOSIS — I129 Hypertensive chronic kidney disease with stage 1 through stage 4 chronic kidney disease, or unspecified chronic kidney disease: Secondary | ICD-10-CM | POA: Insufficient documentation

## 2019-06-30 LAB — CMP (CANCER CENTER ONLY)
ALT: 16 U/L (ref 0–44)
AST: 13 U/L — ABNORMAL LOW (ref 15–41)
Albumin: 4 g/dL (ref 3.5–5.0)
Alkaline Phosphatase: 64 U/L (ref 38–126)
Anion gap: 12 (ref 5–15)
BUN: 44 mg/dL — ABNORMAL HIGH (ref 8–23)
CO2: 15 mmol/L — ABNORMAL LOW (ref 22–32)
Calcium: 9.4 mg/dL (ref 8.9–10.3)
Chloride: 115 mmol/L — ABNORMAL HIGH (ref 98–111)
Creatinine: 2.45 mg/dL — ABNORMAL HIGH (ref 0.44–1.00)
GFR, Est AFR Am: 20 mL/min — ABNORMAL LOW (ref >60)
GFR, Estimated: 17 mL/min — ABNORMAL LOW (ref >60)
Glucose, Bld: 212 mg/dL — ABNORMAL HIGH (ref 70–99)
Potassium: 4.2 mmol/L (ref 3.5–5.1)
Sodium: 142 mmol/L (ref 135–145)
Total Bilirubin: 0.5 mg/dL (ref 0.3–1.2)
Total Protein: 6.9 g/dL (ref 6.5–8.1)

## 2019-06-30 LAB — IRON AND TIBC
Iron: 35 ug/dL — ABNORMAL LOW (ref 41–142)
Saturation Ratios: 15 % — ABNORMAL LOW (ref 21–57)
TIBC: 235 ug/dL — ABNORMAL LOW (ref 236–444)
UIBC: 200 ug/dL (ref 120–384)

## 2019-06-30 LAB — CBC WITH DIFFERENTIAL (CANCER CENTER ONLY)
Abs Immature Granulocytes: 0.04 10*3/uL (ref 0.00–0.07)
Basophils Absolute: 0 10*3/uL (ref 0.0–0.1)
Basophils Relative: 1 %
Eosinophils Absolute: 0.3 10*3/uL (ref 0.0–0.5)
Eosinophils Relative: 5 %
HCT: 31.2 % — ABNORMAL LOW (ref 36.0–46.0)
Hemoglobin: 8.4 g/dL — ABNORMAL LOW (ref 12.0–15.0)
Immature Granulocytes: 1 %
Lymphocytes Relative: 11 %
Lymphs Abs: 0.8 10*3/uL (ref 0.7–4.0)
MCH: 26.3 pg (ref 26.0–34.0)
MCHC: 26.9 g/dL — ABNORMAL LOW (ref 30.0–36.0)
MCV: 97.8 fL (ref 80.0–100.0)
Monocytes Absolute: 0.4 10*3/uL (ref 0.1–1.0)
Monocytes Relative: 7 %
Neutro Abs: 5 10*3/uL (ref 1.7–7.7)
Neutrophils Relative %: 75 %
Platelet Count: 448 10*3/uL — ABNORMAL HIGH (ref 150–400)
RBC: 3.19 MIL/uL — ABNORMAL LOW (ref 3.87–5.11)
RDW: 22.3 % — ABNORMAL HIGH (ref 11.5–15.5)
WBC Count: 6.6 10*3/uL (ref 4.0–10.5)
nRBC: 0.3 % — ABNORMAL HIGH (ref 0.0–0.2)

## 2019-06-30 LAB — SAMPLE TO BLOOD BANK

## 2019-06-30 LAB — FERRITIN: Ferritin: 145 ng/mL (ref 11–307)

## 2019-06-30 MED ORDER — SODIUM CHLORIDE 0.9 % IV SOLN
750.0000 mg | Freq: Once | INTRAVENOUS | Status: AC
  Administered 2019-06-30: 750 mg via INTRAVENOUS
  Filled 2019-06-30: qty 15

## 2019-06-30 MED ORDER — SODIUM CHLORIDE 0.9 % IV SOLN
INTRAVENOUS | Status: DC
  Filled 2019-06-30: qty 250

## 2019-06-30 MED ORDER — ACETAMINOPHEN 325 MG PO TABS
650.0000 mg | ORAL_TABLET | Freq: Once | ORAL | Status: AC
  Administered 2019-06-30: 650 mg via ORAL

## 2019-06-30 MED ORDER — ACETAMINOPHEN 325 MG PO TABS
ORAL_TABLET | ORAL | Status: AC
  Filled 2019-06-30: qty 2

## 2019-06-30 NOTE — Patient Instructions (Signed)
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
## 2019-07-02 ENCOUNTER — Telehealth: Payer: Self-pay | Admitting: Hematology

## 2019-07-02 NOTE — Telephone Encounter (Signed)
Scheduled per 05/12 los, called patient and patient is notified.

## 2019-07-09 NOTE — Progress Notes (Signed)
FOLLOW UP  Assessment and Plan:   Hypertension Well controlled with current medications  Monitor blood pressure at home; patient to call if consistently greater than 130/80 Continue DASH diet.   Reminder to go to the ER if any CP, SOB, nausea, dizziness, severe HA, changes vision/speech, left arm numbness and tingling and jaw pain.  Hyperlipidemia associated with T2DM ([place])  Currently at goal LDL <70; continue statin  Continue low cholesterol diet and exercise.  Check lipid panel.   Diabetes with diabetic chronic kidney disease stage 4 (HCC) Continue medication: humulin 70/30 takes 28 units AM, 26 units PM - well controlled A1C, suggested flexible dosing in evenings, cut back to 24 units unless having high carb meal or noting higher sugars then do 26 units - reminded low sugar more immediately dangerous than high Continue diet and exercise.  Perform daily foot/skin check, notify office of any concerning changes.  Check A1C  Neurological manifestations associated with Diabetes (HCC) Mild intermittent, declines medications  Seizure disorder No recent seizures; continue keppra  Followed by Dr. Brett Fairy Check keppra levels  CKD stage 4 ([place]) Off of metformin and onglyza  Follows with Dr. Harrie Jeans would not have dialysis, advised to avoid NSAIDS Increase fluids, monitor sugars, will monitor CMP/GFR  Hyperparathyroid, secondary, renal origin ([place]) Monitor calcium q59mand as needed; recently improved Follows with nephrology CMP/GFR  Morbid Obesity - BMI 35+ with co morbidities Long discussion about weight loss, diet, and exercise Recommended diet heavy in fruits and veggies and low in animal meats, cheeses, and dairy products, appropriate calorie intake Discussed ideal weight for height, working on portions towards weight goal of <150 lb Glucose recently well controlled, discussed adjusting insulin down when having lower intake meals to help with weight loss Will follow up  in 3 months  Hypothyroidism continue medications the same pending lab results reminded to take on an empty stomach 30-697ms before food.  check TSH level  Vitamin D Def Reduced dose per nephrology/CKD  Defer Vit D level  GERD Well managed on current medications Discussed diet, avoiding triggers and other lifestyle changes  Anemia Following with Dr. KaIrene Limbopending CT/bone marrow biopsy, doing iron infusions  Joint pain Arthritis pains, continue tylenol, suggested OTC topical agents, discussed voltaren   Continue diet and meds as discussed. Further disposition pending results of labs. Discussed med's effects and SE's.   Over 30 minutes of exam, counseling, chart review, and critical decision making was performed.   Future Appointments  Date Time Provider [place]6/22/2021  9:30 AM [facility] None  08/10/2019 11:00 AM WL-CT 1 WL-CT [place]  08/25/2019  1:45 PM [facility] LAB 1 [facility] None  08/25/2019  2:20 PM KaBrunetta GeneraMD [facility] None  11/01/2019 10:00 AM McUnk PintoMD [facility] None  12/20/2019 10:30 AM MiWard GivensNP [facility] None  02/02/2020 10:00 AM CoVicie MuttersPA-C [facility] None    ----------------------------------------------------------------------------------------------------------------------  HPI [age].o. female  presents for 3 month follow up on hypertension, cholesterol, diabetes with CKD 4, hypothyroid, morbid obesity and vitamin D deficiency.   She is living at [facility], she is able to go outdoors, she is doing well. Spending time in garden. Reports was feeling short of breath at last visit but this is much improved.   Patient also has hx/o Seizure Disorder manifest with fugue states /automatisms controlled on Keppra and followed by Dr DoBrett FairyLast seizure was several years ago.   She has OSA recommended CPAP but admits she  has not been wearing for several years, states can sleep much better  without the machine without, declines referral back.   GERD is controlled by prevacid 30 mg daily, does well unless she has very spicy food, avoids.   She recently saw derm Dr. Charlton [facility]or [facility] for left cheek, recently had cryo and doing fluorouracil x 3 weeks.   BMI is Body mass index is 36.76 kg/m., she has been working on diet - limited in exercise.  She reports tries to limit sugars and starches Walking with her neighbor close to home, walking around 30 min when weather isn't too hot, slowly with mild hills. Reports 195 lb on home scale, working towards 150 lb.   Wt Readings from Last 3 Encounters:  07/12/19 201 lb (91.2 kg)  06/30/19 204 lb 9.6 oz (92.8 kg)  05/10/19 201 lb 8 oz (91.4 kg)   She has BP cuff at home but admits she hasn't checked in some time, today their BP is BP: (!) 136/58  She does workout. She endorses exertional dyspnea with walking up hills since starting her walking program, denies cp. Resolves quickly with rest. Reports improved. Denies dizziness, syncope or fatigue.   She reports intermittent mild edema, wears compression hose, uses lasix 40 mg PRN, typically once a week.    She is not on cholesterol medication, crestor 5 mg every other day was d/c'd at last visit per patient and denies myalgias. Her cholesterol is at goal. The cholesterol last visit was:   Lab Results  Component Value Date   CHOL 86 04/05/2019   HDL 24 (L) 04/05/2019   LDLCALC 42 04/05/2019   TRIG 112 04/05/2019   CHOLHDL 3.6 04/05/2019    She has been working on diet and exercise for T2 diabetes, taking novolin 70/30 28 units in the AM and 26 units PM, and denies foot ulcerations, hypoglycemia , increased appetite, nausea, polydipsia, polyuria, visual disturbances, vomiting and weight loss.  She does check fasting sugars, which have ranged 88-130, prior to dinner runs 114-150s. She will often eat a late breakfast and skip lunch and eat dinner. Off metformin due to CKD  stage 4 and decline.  Neuropathy, mild intermittent, no meds Last A1C in the office was:  Lab Results  Component Value Date   HGBA1C 5.6 04/05/2019   She has stable CKD IV due to diabetes, consequent secondary hyperparathyroid and anemia (followed by Dr. Irene Limbo, getting iron infusions, pending CT bone marrow and biopsy on 08/10/2019) and is followed by nephrologist Dr. Harrie Jeans q82mLab Results  Component Value Date   GFRNONAA 17 (L) 06/30/2019   She is on thyroid medication due to hypothyroid. Her medication was not changed last visit.  Taking 100 mcg daily.  Lab Results  Component Value Date   TSH 1.28 04/05/2019   Patient is on Vitamin D supplement and was below goal at recent check, has reduced dose per nephrology, taking 1000 IU daily:   Lab Results  Component Value Date   VD25OH 39 04/05/2019      Current Medications:  Current Outpatient Medications on File Prior to Visit  Medication Sig  . acetaminophen (TYLENOL) 500 MG tablet Take 1,000 mg by mouth every 6 (six) hours as needed for moderate pain.  .Marland Kitchenaspirin 81 MG tablet Take 81 mg by mouth daily.  .Marland Kitchenatenolol (TENORMIN) 50 MG tablet Take 1 tablet (50 mg total) by mouth daily. Take 1 tablet Daily for BP  . cholecalciferol (VITAMIN D3)  25 MCG (1000 UNIT) tablet Take 1,000 Units by mouth daily. Per Nephrologist, start taking 1000 units, but patient taking 5000 units presently.  . fish oil-omega-3 fatty acids 1000 MG capsule Take 2 g by mouth at bedtime.   . Flaxseed, Linseed, (FLAX SEED OIL) 1000 MG CAPS Take 1 capsule by mouth 3 (three) times daily.  . furosemide (LASIX) 40 MG tablet Take one tablet daily or as directed.  Marland Kitchen glucose blood (ONETOUCH VERIO) test strip Check blood sugar 3 times daily-[id number]  . insulin NPH-regular Human (NOVOLIN 70/30) (70-30) 100 UNIT/ML injection Takes 28 units qam and 25 units qpm  . Lancets (FREESTYLE) lancets Use to check glucose 3  x/day  . lansoprazole (PREVACID) 30 MG capsule Take 1  capsule Daily for Indigestion & Heartburn  . levETIRAcetam (KEPPRA) 500 MG tablet Take 1 tablet 3 x /day after Meals to Prevent Seizures  . levothyroxine (SYNTHROID) 100 MCG tablet TAKE 1 TABLET BY MOUTH DAILY  . loratadine (CLARITIN) 10 MG tablet Take 10 mg by mouth daily.  . meclizine (ANTIVERT) 25 MG tablet Take 1 tablet up yo 3 x / day if needed for Dizziness / Vertigo  . rOPINIRole (REQUIP) 1 MG tablet Take 1/2 to 1 tablet 3 x /day as needed for Restless Legs & Cramps  . vitamin B-12 (CYANOCOBALAMIN) 100 MCG tablet Take 500 mcg by mouth daily.   . vitamin C (ASCORBIC ACID) 500 MG tablet Take 1,000 mg by mouth 3 (three) times daily.   . ferrous sulfate dried (SLOW FE) 160 (50 FE) MG TBCR Take 160 mg by mouth 2 (two) times daily.   . rosuvastatin (CRESTOR) 5 MG tablet Take 1 tablet once weekly for Cholesterol (Patient not taking: Reported on 05/03/2019)   No current facility-administered medications on file prior to visit.     Allergies:  Allergies  Allergen Reactions  . Ppd [Tuberculin Purified Protein Derivative] Other (See Comments)    indurated     Medical History:  Past Medical History:  Diagnosis Date  . Anemia, unspecified   . Arthritis    "knees; right shoulder" (09/15/2013)  . Basal cell carcinoma    "right upper outer lip"  . DM neuropathy, type II diabetes mellitus ([place])   . Epilepsy ([place])   . GERD (gastroesophageal reflux disease)   . Gout   . High cholesterol   . Hypertension   . Hypothyroidism   . Meniere's disease   . Obesity (BMI 30-39.9)   . Other forms of epilepsy and recurrent seizures without mention of intractable epilepsy 11/04/2012   Non convulsive paroxysmal spells, responding to [facility], patient not driving.   . Pneumonia ~ 1943  . Skin cancer    "forehead; right hand"  . Type II or unspecified type diabetes mellitus without mention of complication, not stated as uncontrolled   . UTI (urinary tract infection)   . Vitamin D deficiency    Family  history- Reviewed and unchanged Social history- Reviewed and unchanged   Review of Systems:  Review of Systems  Constitutional: Negative for malaise/fatigue and weight loss.  HENT: Negative for hearing loss and tinnitus.   Eyes: Negative for blurred vision and double vision.  Respiratory: Negative for cough, shortness of breath (exertional dyspnea with hills, quickly resolves with rest) and wheezing.   Cardiovascular: Negative for chest pain, palpitations, orthopnea, claudication and leg swelling.  Gastrointestinal: Negative for abdominal pain, blood in stool, constipation, diarrhea, heartburn, melena, nausea and vomiting.  Genitourinary: Negative.   Musculoskeletal: Positive for  joint pain (hands, knees intermittent). Negative for falls and myalgias.  Skin: Negative for rash.  Neurological: Negative for dizziness, tingling, sensory change, weakness and headaches.  Endo/Heme/Allergies: Negative for polydipsia.  Psychiatric/Behavioral: Negative.   All other systems reviewed and are negative.   Physical Exam: BP (!) 136/58   Pulse 78   Temp (!) 97.5 F (36.4 C)   Wt 201 lb (91.2 kg)   SpO2 94%   BMI 36.76 kg/m  Wt Readings from Last 3 Encounters:  07/12/19 201 lb (91.2 kg)  06/30/19 204 lb 9.6 oz (92.8 kg)  05/10/19 201 lb 8 oz (91.4 kg)   General Appearance: Well nourished, in no apparent distress. Eyes: PERRLA, EOMs, conjunctiva no swelling or erythema Sinuses: No Frontal/maxillary tenderness ENT/Mouth: Ext aud canals clear, TMs without erythema, bulging. No erythema, swelling, or exudate on post pharynx.  Tonsils not swollen or erythematous. Hearing normal.  Neck: Supple, thyroid normal.  Respiratory: Respiratory effort normal, BS equal bilaterally without rales, rhonchi, wheezing or stridor.  Cardio: RRR with no RGs,  2/6 early systolic murmur at L 2nd ICD with radiation to neck. Symmetrical peripheral pulses without notable edema (wearing compression hose).  Abdomen:  Soft, + BS.  Non tender, no guarding, rebound, hernias, masses. Lymphatics: Non tender without lymphadenopathy.  Musculoskeletal: Full ROM, Symmetrical strength, Slow gait with cane. She has bony enlargement of bilateral hand PIP/DIP joints.  Skin: Warm, dry without rashes, ecchymosis. She has erythematous area without distinct lesion to left cheek.  Neuro: Cranial nerves intact. No cerebellar symptoms.  Psych: Awake and oriented X 3, normal affect, Insight and Judgment appropriate.    Izora Ribas, NP 9:40 AM [facility]

## 2019-07-12 ENCOUNTER — Ambulatory Visit: Payer: Medicare Other | Admitting: Adult Health

## 2019-07-12 ENCOUNTER — Other Ambulatory Visit: Payer: Self-pay

## 2019-07-12 ENCOUNTER — Encounter: Payer: Self-pay | Admitting: Adult Health

## 2019-07-12 VITALS — BP 136/58 | HR 78 | Temp 97.5°F | Wt 201.0 lb

## 2019-07-12 DIAGNOSIS — E1169 Type 2 diabetes mellitus with other specified complication: Secondary | ICD-10-CM

## 2019-07-12 DIAGNOSIS — E1122 Type 2 diabetes mellitus with diabetic chronic kidney disease: Secondary | ICD-10-CM | POA: Diagnosis not present

## 2019-07-12 DIAGNOSIS — E039 Hypothyroidism, unspecified: Secondary | ICD-10-CM

## 2019-07-12 DIAGNOSIS — Z794 Long term (current) use of insulin: Secondary | ICD-10-CM

## 2019-07-12 DIAGNOSIS — N184 Chronic kidney disease, stage 4 (severe): Secondary | ICD-10-CM

## 2019-07-12 DIAGNOSIS — I10 Essential (primary) hypertension: Secondary | ICD-10-CM | POA: Diagnosis not present

## 2019-07-12 DIAGNOSIS — E1149 Type 2 diabetes mellitus with other diabetic neurological complication: Secondary | ICD-10-CM

## 2019-07-12 DIAGNOSIS — N2581 Secondary hyperparathyroidism of renal origin: Secondary | ICD-10-CM

## 2019-07-12 DIAGNOSIS — K219 Gastro-esophageal reflux disease without esophagitis: Secondary | ICD-10-CM | POA: Diagnosis not present

## 2019-07-12 DIAGNOSIS — E785 Hyperlipidemia, unspecified: Secondary | ICD-10-CM

## 2019-07-12 DIAGNOSIS — G40909 Epilepsy, unspecified, not intractable, without status epilepticus: Secondary | ICD-10-CM

## 2019-07-12 DIAGNOSIS — E559 Vitamin D deficiency, unspecified: Secondary | ICD-10-CM

## 2019-07-12 MED ORDER — NOVOLIN 70/30 (70-30) 100 UNIT/ML ~~LOC~~ SUSP: SUBCUTANEOUS | 3 refills | Status: DC

## 2019-07-12 NOTE — Patient Instructions (Addendum)
Goals    . HEMOGLOBIN A1C < 8.0     Fasting glucose goal <150 Keep daily BID glucose log and bring to each visit Monitor sugars - insulin to 28 units AM, 24-26 units PM     . LDL CALC < 70    . Weight (lb) < 200 lb (90.7 kg)     Try to add in more veggies for weight loss Keep the food diary and monitor what foods make your sugars go up  Suggest trying chair aerobics, getting a pool or can try tai chi This will help a lot with your joints, with your sugars and movement      Tylenol for joint pains - can also try putting ice if hot/swollen/inflamed, try picking up diclofenac (voltaren) gel over the counter, can apply several times a day for joint pain and inflammation. This is a NSAID but as a local medication is stiff safe for stomach/kidneys. Other patients also report blue emu, etc OTC topical meds can be quite helpful       Diclofenac skin gel What is this medicine? DICLOFENAC (dye KLOE fen ak) is a non-steroidal anti-inflammatory drug (NSAID). The 1% skin gel is used to treat osteoarthritis of the hands or knees. The 3% skin gel is used to treat actinic keratosis. This medicine may be used for other purposes; ask your health care provider or pharmacist if you have questions. COMMON BRAND NAME(S): DSG Pak, Omeca, Solaravix, Solaraze, Voltaren Arthritis, Voltaren Gel What should I tell my health care provider before I take this medicine? They need to know if you have any of these conditions:  asthma  bleeding problems  coronary artery bypass graft (CABG) surgery within the past 2 weeks  heart disease  high blood pressure  if you frequently drink alcohol containing drinks  kidney disease  liver disease  open or infected skin  stomach problems  an unusual or allergic reaction to diclofenac, aspirin, other NSAIDs, other medicines, benzyl alcohol (3% gel only), foods, dyes, or preservatives  pregnant or trying to get pregnant  breast-feeding How should I use this  medicine? This medicine is for external use only. Follow the directions on the prescription label. Wash hands before and after use. Do not get this medicine in your eyes. If you do, rinse out with plenty of cool tap water. Use your doses at regular intervals. Do not use your medicine more often than directed. A special MedGuide will be given to you by the pharmacist with each prescription and refill of the 1% gel. Be sure to read this information carefully each time. Talk to your pediatrician regarding the use of this medicine in children. Special care may be needed. The 3% gel is not approved for use in children. Overdosage: If you think you have taken too much of this medicine contact a poison control center or emergency room at once. NOTE: This medicine is only for you. Do not share this medicine with others. What if I miss a dose? If you miss a dose, use it as soon as you can. If it is almost time for your next dose, use only that dose. Do not use double or extra doses. What may interact with this medicine?  aspirin  NSAIDs, medicines for pain and inflammation, like ibuprofen or naproxen Do not use any other skin products without telling your doctor or health care professional. This list may not describe all possible interactions. Give your health care provider a list of all the medicines, herbs,  non-prescription drugs, or dietary supplements you use. Also tell them if you smoke, drink alcohol, or use illegal drugs. Some items may interact with your medicine. What should I watch for while using this medicine? Tell your doctor or healthcare provider if your symptoms do not start to get better or if they get worse. You will need to follow up with your healthcare provider to monitor your progress. You may need to be treated for up to 3 months if you are using the 3% gel, but the full effect may not occur until 1 month after stopping treatment. If you develop a severe skin reaction, contact your  doctor or healthcare provider immediately. This medicine may cause serious skin reactions. They can happen weeks to months after starting the medicine. Contact your healthcare provider right away if you notice fevers or flu-like symptoms with a rash. The rash may be red or purple and then turn into blisters or peeling of the skin. Or, you might notice a red rash with swelling of the face, lips or lymph nodes in your neck or under your arms. This medicine can make you more sensitive to the sun. Keep out of the sun. If you cannot avoid being in the sun, wear protective clothing and use sunscreen. Do not use sun lamps or tanning beds/booths. Do not take medicines such as ibuprofen and naproxen with this medicine. Side effects such as stomach upset, nausea, or ulcers may be more likely to occur. Many medicines available without a prescription should not be taken with this medicine. This medicine does not prevent heart attack or stroke. In fact, this medicine may increase the chance of a heart attack or stroke. The chance may increase with longer use of this medicine and in people who have heart disease. If you take aspirin to prevent heart attack or stroke, talk with your doctor or healthcare provider. This medicine can cause ulcers and bleeding in the stomach and intestines at any time during treatment. Do not smoke cigarettes or drink alcohol. These increase irritation to your stomach and can make it more susceptible to damage from this medicine. Ulcers and bleeding can happen without warning symptoms and can cause death. You may get drowsy or dizzy. Do not drive, use machinery, or do anything that needs mental alertness until you know how this medicine affects you. Do not stand or sit up quickly, especially if you are an older patient. This reduces the risk of dizzy or fainting spells. This medicine can cause you to bleed more easily. Try to avoid damage to your teeth and gums when you brush or floss your  teeth. What side effects may I notice from receiving this medicine? Side effects that you should report to your doctor or health care professional as soon as possible:  allergic reactions like skin rash, itching or hives, swelling of the face, lips, or tongue  black or bloody stools, blood in the urine or vomit  blurred vision  chest pain  difficulty breathing or wheezing  nausea or vomiting  rash, fever, and swollen lymph nodes  redness, blistering, peeling or loosening of the skin, including inside the mouth  slurred speech or weakness on one side of the body  trouble passing urine or change in the amount of urine  unexplained weight gain or swelling  unusually weak or tired  yellowing of eyes or skin Side effects that usually do not require medical attention (report to your doctor or health care professional if they continue or are  bothersome):  dizziness  dry skin  headache  heartburn  increased sensitivity to the sun  stomach pain  tingling at the application site This list may not describe all possible side effects. Call your doctor for medical advice about side effects. You may report side effects to FDA at 1-800-FDA-1088. Where should I keep my medicine? Keep out of the reach of children. Store the 1% gel at room temperature between 15 and 30 degrees C (59 and 86 degrees F). Store the 3% gel at room temperature between 20 and 25 degrees C (68 and 77 degrees F). Protect from light. Throw away any unused medicine after the expiration date. NOTE: This sheet is a summary. It may not cover all possible information. If you have questions about this medicine, talk to your doctor, pharmacist, or health care provider.  2020 Elsevier/Gold Standard (2018-04-22 13:05:18)

## 2019-07-15 LAB — LIPID PANEL
Cholesterol: 108 mg/dL (ref <200)
HDL: 28 mg/dL — ABNORMAL LOW (ref >=50)
LDL Cholesterol (Calc): 59 mg/dL (calc)
Non-HDL Cholesterol (Calc): 80 mg/dL (calc) (ref <130)
Total CHOL/HDL Ratio: 3.9 (calc) (ref <5.0)
Triglycerides: 127 mg/dL (ref <150)

## 2019-07-15 LAB — COMPLETE METABOLIC PANEL WITH GFR
AG Ratio: 2 (calc) (ref 1.0–2.5)
ALT: 11 U/L (ref 6–29)
AST: 12 U/L (ref 10–35)
Albumin: 4.5 g/dL (ref 3.6–5.1)
Alkaline phosphatase (APISO): 60 U/L (ref 37–153)
BUN/Creatinine Ratio: 18 (calc) (ref 6–22)
BUN: 32 mg/dL — ABNORMAL HIGH (ref 7–25)
CO2: 24 mmol/L (ref 20–32)
Calcium: 10 mg/dL (ref 8.6–10.4)
Chloride: 111 mmol/L — ABNORMAL HIGH (ref 98–110)
Creat: 1.8 mg/dL — ABNORMAL HIGH (ref 0.60–0.88)
GFR, Est African American: 29 mL/min/{1.73_m2} — ABNORMAL LOW (ref >=60)
GFR, Est Non African American: 25 mL/min/{1.73_m2} — ABNORMAL LOW (ref >=60)
Globulin: 2.3 g/dL (calc) (ref 1.9–3.7)
Glucose, Bld: 121 mg/dL — ABNORMAL HIGH (ref 65–99)
Potassium: 4.8 mmol/L (ref 3.5–5.3)
Sodium: 142 mmol/L (ref 135–146)
Total Bilirubin: 0.4 mg/dL (ref 0.2–1.2)
Total Protein: 6.8 g/dL (ref 6.1–8.1)

## 2019-07-15 LAB — CBC WITH DIFFERENTIAL/PLATELET
Absolute Monocytes: 444 cells/uL (ref 200–950)
Basophils Absolute: 31 cells/uL (ref 0–200)
Basophils Relative: 0.6 %
Eosinophils Absolute: 383 cells/uL (ref 15–500)
Eosinophils Relative: 7.5 %
HCT: 35.3 % (ref 35.0–45.0)
Hemoglobin: 10.4 g/dL — ABNORMAL LOW (ref 11.7–15.5)
Lymphs Abs: 597 cells/uL — ABNORMAL LOW (ref 850–3900)
MCH: 27.4 pg (ref 27.0–33.0)
MCHC: 29.5 g/dL — ABNORMAL LOW (ref 32.0–36.0)
MCV: 93.1 fL (ref 80.0–100.0)
MPV: 10.2 fL (ref 7.5–12.5)
Monocytes Relative: 8.7 %
Neutro Abs: 3647 cells/uL (ref 1500–7800)
Neutrophils Relative %: 71.5 %
Platelets: 382 10*3/uL (ref 140–400)
RBC: 3.79 10*6/uL — ABNORMAL LOW (ref 3.80–5.10)
RDW: 19 % — ABNORMAL HIGH (ref 11.0–15.0)
Total Lymphocyte: 11.7 %
WBC: 5.1 10*3/uL (ref 3.8–10.8)

## 2019-07-15 LAB — HEMOGLOBIN A1C
Hgb A1c MFr Bld: 4.9 % of total Hgb (ref <5.7)
Mean Plasma Glucose: 94 (calc)
eAG (mmol/L): 5.2 (calc)

## 2019-07-15 LAB — TSH: TSH: 2.2 mIU/L (ref 0.40–4.50)

## 2019-07-15 LAB — MAGNESIUM: Magnesium: 2.3 mg/dL (ref 1.5–2.5)

## 2019-07-15 LAB — LEVETIRACETAM LEVEL: Keppra (Levetiracetam): 43.2 ug/mL (ref 12.0–46.0)

## 2019-07-20 ENCOUNTER — Other Ambulatory Visit: Payer: Self-pay | Admitting: Internal Medicine

## 2019-07-26 ENCOUNTER — Other Ambulatory Visit: Payer: Self-pay | Admitting: Internal Medicine

## 2019-07-29 ENCOUNTER — Ambulatory Visit: Payer: Medicare Other | Admitting: Adult Health Nurse Practitioner

## 2019-07-30 DIAGNOSIS — Z1212 Encounter for screening for malignant neoplasm of rectum: Secondary | ICD-10-CM | POA: Diagnosis not present

## 2019-07-30 DIAGNOSIS — Z1211 Encounter for screening for malignant neoplasm of colon: Secondary | ICD-10-CM

## 2019-08-03 ENCOUNTER — Ambulatory Visit: Payer: Medicare Other | Admitting: Adult Health Nurse Practitioner

## 2019-08-03 ENCOUNTER — Other Ambulatory Visit: Payer: Self-pay

## 2019-08-03 ENCOUNTER — Encounter: Payer: Self-pay | Admitting: Adult Health Nurse Practitioner

## 2019-08-03 VITALS — BP 126/62 | HR 64 | Temp 97.3°F | Wt 195.0 lb

## 2019-08-03 DIAGNOSIS — M1A9XX1 Chronic gout, unspecified, with tophus (tophi): Secondary | ICD-10-CM | POA: Diagnosis not present

## 2019-08-03 DIAGNOSIS — N184 Chronic kidney disease, stage 4 (severe): Secondary | ICD-10-CM | POA: Diagnosis not present

## 2019-08-03 DIAGNOSIS — M10349 Gout due to renal impairment, unspecified hand: Secondary | ICD-10-CM

## 2019-08-03 DIAGNOSIS — E1122 Type 2 diabetes mellitus with diabetic chronic kidney disease: Secondary | ICD-10-CM | POA: Diagnosis not present

## 2019-08-03 DIAGNOSIS — I10 Essential (primary) hypertension: Secondary | ICD-10-CM

## 2019-08-03 MED ORDER — COLCHICINE 0.6 MG PO TABS: ORAL_TABLET | ORAL | 2 refills | Status: DC

## 2019-08-03 NOTE — Progress Notes (Signed)
Assessment and Plan:  [name] [name] was seen today for blister.  Diagnoses and all orders for this visit:  Gout, tophaceous Acute gout due to renal impairment involving hand, blaterality -     Uric acid -     colchicine 0.6 MG tablet; Take two tablets 1.2mg  now and second tablet 1 hour later. Discussed acute treatment Monitor symptoms Contact office with new or worsening symptoms  Essential hypertension Continue current medications: Monitor blood pressure at home; call if consistently over 130/80 Continue DASH diet.   Reminder to go to the ER if any CP, SOB, nausea, dizziness, severe HA, changes vision/speech, left arm numbness and tingling and jaw pain.  CKD stage 4 due to type 2 diabetes mellitus ([place]) Continue medications: Discussed dietary and exercise modifications Low fat diet -     Iron and TIBC; Future -     Ferritin -     CBC with Differential/Platelet -     COMPLETE METABOLIC PANEL WITH GFR  Further disposition pending results of labs. Discussed med's effects and SE's.   Over 30 minutes of face to face exam, counseling, chart review, and critical decision making was performed.   Future Appointments  Date Time Provider [place]  08/10/2019  9:30 AM [facility] ROOM [facility] None  08/10/2019 11:00 AM [facility] 1 [facility] [place]  08/25/2019  1:45 PM [facility] LAB 1 [facility] None  08/25/2019  2:20 PM Brunetta Genera, MD [facility] None  11/01/2019 10:00 AM Unk Pinto, MD [facility] None  12/20/2019 10:30 AM Ward Givens, NP [facility] None  02/02/2020 10:00 AM Vicie Mutters, PA-C [facility] None    ------------------------------------------------------------------------------------------------------------------   HPI 86 y.o.female presents for evaluation of pain of bilateral hands.  She reports that it start at the end of May on the way to [place].  She was seen at an urgent care and reported it was gout and was given Cephalexin 500mg  three times a day  for 10days.   She reports that it start on left pointer finger,middle and pinky finger and now on right middle finger, DIP joints.  Reports that her hands ache and hurt in DIP and PIP joints.  She reports that the antibiotics did not help with her pain or symptoms.   She had been putting diclofenac gel for arthritis in her bilateral hands.  For at least a couple of weeks, without resolve.  Past Medical History:  Diagnosis Date  . Anemia, unspecified   . Arthritis    "knees; right shoulder" (09/15/2013)  . Basal cell carcinoma    "right upper outer lip"  . DM neuropathy, type II diabetes mellitus ([place])   . Epilepsy ([place])   . GERD (gastroesophageal reflux disease)   . Gout   . High cholesterol   . Hypertension   . Hypothyroidism   . Meniere's disease   . Obesity (BMI 30-39.9)   . Other forms of epilepsy and recurrent seizures without mention of intractable epilepsy 11/04/2012   Non convulsive paroxysmal spells, responding to [facility], patient not driving.   . Pneumonia ~ 1943  . Skin cancer    "forehead; right hand"  . Type II or unspecified type diabetes mellitus without mention of complication, not stated as uncontrolled   . UTI (urinary tract infection)   . Vitamin D deficiency      Allergies  Allergen Reactions  . Ppd [Tuberculin Purified Protein Derivative] Other (See Comments)    indurated    Current Outpatient Medications on File Prior to Visit  Medication Sig  .  acetaminophen (TYLENOL) 500 MG tablet Take 1,000 mg by mouth every 6 (six) hours as needed for moderate pain.  Marland Kitchen aspirin 81 MG tablet Take 81 mg by mouth daily.  Marland Kitchen atenolol (TENORMIN) 50 MG tablet Take 1 tablet (50 mg total) by mouth daily. Take 1 tablet Daily for BP  . cholecalciferol (VITAMIN D3) 25 MCG (1000 UNIT) tablet Take 1,000 Units by mouth daily. Per Nephrologist, start taking 1000 units, but patient taking 5000 units presently.  . fish oil-omega-3 fatty acids 1000 MG capsule Take 2 g by mouth at bedtime.    . Flaxseed, Linseed, (FLAX SEED OIL) 1000 MG CAPS Take 1 capsule by mouth 3 (three) times daily.  . furosemide (LASIX) 40 MG tablet Take one tablet daily or as directed.  Marland Kitchen glucose blood (ONETOUCH VERIO) test strip Check blood sugar 3 times daily-[id number]  . insulin NPH-regular Human (NOVOLIN 70/30) (70-30) 100 UNIT/ML injection Takes 28 units qam and 24-26 units qpm  . lansoprazole (PREVACID) 30 MG capsule Take 1 capsule Daily for Indigestion & Heartburn  . levETIRAcetam (KEPPRA) 500 MG tablet Take 1 tablet 3 x /day after Meals to Prevent Seizures  . levothyroxine (SYNTHROID) 100 MCG tablet Take 1 tablet daily on an empty stomach with only water for 30 minutes & no Antacid meds, Calcium or Magnesium for 4 hours & avoid Biotin  . loratadine (CLARITIN) 10 MG tablet Take 10 mg by mouth daily.  . meclizine (ANTIVERT) 25 MG tablet TAKE ONE TABLET BY MOUTH UP TO THREE TIMES PER DAY IF NEEDED FOR DIZZINESS/VERTIGO  . rOPINIRole (REQUIP) 1 MG tablet Take 1/2 to 1 tablet 3 x /day as needed for Restless Legs & Cramps  . vitamin B-12 (CYANOCOBALAMIN) 100 MCG tablet Take 500 mcg by mouth daily.   . vitamin C (ASCORBIC ACID) 500 MG tablet Take 1,000 mg by mouth 3 (three) times daily.   . Lancets (FREESTYLE) lancets Use to check glucose 3  x/day  . rosuvastatin (CRESTOR) 5 MG tablet Take 1 tablet once weekly for Cholesterol (Patient not taking: Reported on 05/03/2019)   No current facility-administered medications on file prior to visit.    ROS: all negative except above.   Physical Exam:  BP 126/62   Pulse 64   Temp (!) 97.3 F (36.3 C)   Wt 195 lb (88.5 kg)   SpO2 98%   BMI 35.67 kg/m   General Appearance: Well nourished, in no apparent distress. Eyes: PERRLA, EOMs, conjunctiva no swelling or erythema Sinuses: No Frontal/maxillary tenderness ENT/Mouth: Ext aud canals clear, TMs without erythema, bulging. No erythema, swelling, or exudate on post pharynx.  Tonsils not swollen or  erythematous. Hearing normal.  Neck: Supple, thyroid normal.  Respiratory: Respiratory effort normal, BS equal bilaterally without rales, rhonchi, wheezing or stridor.  Cardio: RRR with no MRGs. Brisk peripheral pulses without edema.  Abdomen: Soft, + BS.  Non tender, no guarding, rebound, hernias, masses. Lymphatics: Non tender without lymphadenopathy.  Musculoskeletal: Full ROM, 5/5 strength, normal gait. DIP and PIP joint bilateral erythematous and tophi noted bilaterally. Skin: Warm, dry without rashes, lesions, ecchymosis.  Neuro: Cranial nerves intact. Normal muscle tone, no cerebellar symptoms. Sensation intact.  Psych: Awake and oriented X 3, normal affect, Insight and Judgment appropriate.     [name], NP 12:30 PM [facility]

## 2019-08-03 NOTE — Patient Instructions (Signed)
We have called in Colchicine for you to take.  Take two tablets when you pick up prescription then take another tablet one hours later.  You should feel better shortly after taking this.   Information for patients with Gout  Gout defined-Gout occurs when urate crystals accumulate in your joint causing the inflammation and intense pain of gout attack.  Urate crystals can form when you have high levels of uric acid in your blood.  Your body produces uric acid when it breaks down prurines-substances that are found naturally in your body, as well as in certain foods such as organ meats, anchioves, herring, asparagus, and mushrooms.  Normally uric acid dissolves in your blood and passes through your kidneys into your urine.  But sometimes your body either produces too much uric acid or your kidneys excrete too little uric acid.  When this happens, uric acid can build up, forming sharp needle-like urate crystals in a joint or surrounding tissue that cause pain, inflammation and swelling.    Gout is characterized by sudden, severe attacks of pain, redness and tenderness in joints, often the joint at the base of the big toe.  Gout is complex form of arthritis that can affect anyone.  Men are more likely to get gout but women become increasingly more susceptible to gout after menopause.  An acute attack of gout can wake you up in the middle of the night with the sensation that your big toe is on fire.  The affected joint is hot, swollen and so tender that even the weight or the sheet on it may seem intolerable.  If you experience symptoms of an acute gout attack it is important to your doctor as soon as the symptoms start.  Gout that goes untreated can lead to worsening pain and joint damage.  Risk Factors:  You are more likely to develop gout if you have high levels of uric acid in your body.    Factors that increase the uric acid level in your body include:  Lifestyle factors.  Excessive alcohol  use-generally more than two drinks a day for men and more than one for women increase the risk of gout.  Medical conditions.  Certain conditions make it more likely that you will develop gout.  These include hypertension, and chronic conditions such as diabetes, high levels of fat and cholesterol in the blood, and narrowing of the arteries.  Certain medications.  The uses of Thiazide diuretics- commonly used to treat hypertension and low dose aspirin can also increase uric acid levels.  Family history of gout.  If other members of your family have had gout, you are more likely to develop the disease.  Age and sex. Gout occurs more often in men than it does in women, primarily because women tend to have lower uric acid levels than men do.  Men are more likely to develop gout earlier usually between the ages of 37-50- whereas women generally develop signs and symptoms after menopause.    Tests and diagnosis:  Tests to help diagnose gout may include:  Blood test.  Your doctor may recommend a blood test to measure the uric acid level in your blood .  Blood tests can be misleading, though.  Some people have high uric acid levels but never experience gout.  And some people have signs and symptoms of gout, but don't have unusual levels of uric acid in their blood.  Joint fluid test.  Your doctor may use a needle to draw  fluid from your affected joint.  When examined under the microscope, your joint fluid may reveal urate crystals.  Treatment:  Treatment for gout usually involves medications.  What medications you and your doctor choose will be based on your current health and other medications you currently take.  Gout medications can be used to treat acute gout attacks and prevent future attacks as well as reduce your risk of complications from gout such as the development of tophi from urate crystal deposits.  Alternative medicine:   Certain foods have been studied for their potential to lower  uric acid levels, including:  Coffee.  Studies have found an association between coffee drinking (regular and decaf) and lower uric acid levels.  The evidence is not enough to encourage non-coffee drinkers to start, but it may give clues to new ways of treating gout in the future.  Vitamin C.  Supplements containing vitamin C may reduce the levels of uric acid in your blood.  However, vitamin as a treatment for gout. Don't assume that if a little vitamin C is good, than lots is better.  Megadoses of vitamin C may increase your bodies uric acid levels.  Cherries.  Cherries have been associated with lower levels of uric acid in studies, but it isn't clear if they have any effect on gout signs and symptoms.  Eating more cherries and other dar-colored fruits, such as blackberries, blueberries, purple grapes and raspberries, may be a safe way to support your gout treatment.    Lifestyle/Diet Recommendations:   Drink 8 to 16 cups ( about 2 to 4 liters) of fluid each day, with at least half being water.  Avoid alcohol  Eat a moderate amount of protein, preferably from healthy sources, such as low-fat or fat-free dairy, tofu, eggs, and nut butters.  Limit you daily intake of meat, fish, and poultry to 4 to 6 ounces.  Avoid high fat meats and desserts.  Decrease you intake of shellfish, beef, lamb, pork, eggs and cheese.  Choose a good source of vitamin C daily such as citrus fruits, strawberries, broccoli,  brussel sprouts, papaya, and cantaloupe.   Choose a good source of vitamin A every other day such as yellow fruits, or dark green/yellow vegetables.  Avoid drastic weigh reduction or fasting.  If weigh loss is desired lose it over a period of several months.  See "dietary considerations.." chart for specific food recommendations.  Dietary Considerations for people with Gout  Food with negligible purine content (0-15 mg of purine nitrogen per 100 grams food)  May use as desired except on  calorie variations  Non fat milk Cocoa Cereals (except in list II) Hard candies  Buttermilk Carbonated drinks Vegetables (except in list II) Sherbet  Coffee Fruits Sugar Honey  Tea Cottage Cheese Gelatin-jell-o Salt  Fruit juice Breads Angel food Cake   Herbs/spices Jams/Jellies [employer]    Foods that do not contain excessive purine content, but must be limited due to fat content  Cream Eggs Oil and Salad Dressing  Half and Half Peanut Butter Chocolate  Whole Milk Cakes Potato Chips  Butter Ice Cream Fried Foods  Cheese Nuts Waffles, pancakes   List II: Food with moderate purine content (50-150 mg of purine nitrogen per 100 grams of food)  Limit total amount each day to 5 oz. cooked Lean meat, other than those on list III   Poultry, other than those on list III Fish, other than those on list III   Seafood, other than  those on list III  These foods may be used occasionally  Peas Lentils Bran  Spinach Oatmeal Dried Beans and Peas  Asparagus Wheat Germ Mushrooms   Additional information about meat choices  Choose fish and poultry, particularly without skin, often.  Select lean, well trimmed cuts of meat.  Avoid all fatty meats, bacon , sausage, fried meats, fried fish, or poultry, luncheon meats, cold cuts, hot dogs, meats canned or frozen in gravy, spareribs and frozen and packaged prepared meats.   List III: Foods with HIGH purine content / Foods to AVOID (150-800 mg of purine nitrogen per 100 grams of food)  Anchovies Herring Meat Broths  Liver Mackerel Meat Extracts  Kidney Scallops Meat Drippings  Sardines Wild Game Mincemeat  Sweetbreads Goose Gravy  Heart Tongue Yeast, baker's and brewers   Commercial soups made with any of the foods listed in List II or List III  In addition avoid all alcoholic beverages

## 2019-08-04 LAB — CBC WITH DIFFERENTIAL/PLATELET
Absolute Monocytes: 631 cells/uL (ref 200–950)
Basophils Absolute: 41 cells/uL (ref 0–200)
Basophils Relative: 0.5 %
Eosinophils Absolute: 886 cells/uL — ABNORMAL HIGH (ref 15–500)
Eosinophils Relative: 10.8 %
HCT: 39.4 % (ref 35.0–45.0)
Hemoglobin: 11.8 g/dL (ref 11.7–15.5)
Lymphs Abs: 779 cells/uL — ABNORMAL LOW (ref 850–3900)
MCH: 26.6 pg — ABNORMAL LOW (ref 27.0–33.0)
MCHC: 29.9 g/dL — ABNORMAL LOW (ref 32.0–36.0)
MCV: 88.9 fL (ref 80.0–100.0)
MPV: 9.9 fL (ref 7.5–12.5)
Monocytes Relative: 7.7 %
Neutro Abs: 5863 cells/uL (ref 1500–7800)
Neutrophils Relative %: 71.5 %
Platelets: 500 10*3/uL — ABNORMAL HIGH (ref 140–400)
RBC: 4.43 10*6/uL (ref 3.80–5.10)
RDW: 18.4 % — ABNORMAL HIGH (ref 11.0–15.0)
Total Lymphocyte: 9.5 %
WBC: 8.2 10*3/uL (ref 3.8–10.8)

## 2019-08-04 LAB — COMPLETE METABOLIC PANEL WITH GFR
AG Ratio: 1.7 (calc) (ref 1.0–2.5)
ALT: 12 U/L (ref 6–29)
AST: 13 U/L (ref 10–35)
Albumin: 4.5 g/dL (ref 3.6–5.1)
Alkaline phosphatase (APISO): 75 U/L (ref 37–153)
BUN/Creatinine Ratio: 27 (calc) — ABNORMAL HIGH (ref 6–22)
BUN: 47 mg/dL — ABNORMAL HIGH (ref 7–25)
CO2: 22 mmol/L (ref 20–32)
Calcium: 11.1 mg/dL — ABNORMAL HIGH (ref 8.6–10.4)
Chloride: 107 mmol/L (ref 98–110)
Creat: 1.71 mg/dL — ABNORMAL HIGH (ref 0.60–0.88)
GFR, Est African American: 31 mL/min/{1.73_m2} — ABNORMAL LOW (ref >=60)
GFR, Est Non African American: 27 mL/min/{1.73_m2} — ABNORMAL LOW (ref >=60)
Globulin: 2.7 g/dL (calc) (ref 1.9–3.7)
Glucose, Bld: 205 mg/dL — ABNORMAL HIGH (ref 65–99)
Potassium: 4.5 mmol/L (ref 3.5–5.3)
Sodium: 139 mmol/L (ref 135–146)
Total Bilirubin: 0.3 mg/dL (ref 0.2–1.2)
Total Protein: 7.2 g/dL (ref 6.1–8.1)

## 2019-08-04 LAB — FERRITIN: Ferritin: 332 ng/mL — ABNORMAL HIGH (ref 16–288)

## 2019-08-04 LAB — URIC ACID: Uric Acid, Serum: 11.9 mg/dL — ABNORMAL HIGH (ref 2.5–7.0)

## 2019-08-05 NOTE — Progress Notes (Signed)
Discussed results with patient via telephone.  All questions answered.  Gout flare improving.  Contact office with any new or worsening symptoms.  [name], Laqueta Jean, DNP [facility] 08/05/2019  8:34 AM

## 2019-08-09 ENCOUNTER — Other Ambulatory Visit: Payer: Self-pay | Admitting: Student

## 2019-08-10 ENCOUNTER — Other Ambulatory Visit: Payer: Self-pay

## 2019-08-10 ENCOUNTER — Ambulatory Visit (HOSPITAL_COMMUNITY)
Admission: RE | Admit: 2019-08-10 | Discharge: 2019-08-10 | Disposition: A | Discharge status: Home / Self Care | Payer: Medicare Other | Source: Ambulatory Visit | Attending: Hematology | Admitting: Hematology

## 2019-08-10 ENCOUNTER — Encounter (HOSPITAL_COMMUNITY): Payer: Self-pay

## 2019-08-10 DIAGNOSIS — M109 Gout, unspecified: Secondary | ICD-10-CM | POA: Diagnosis not present

## 2019-08-10 DIAGNOSIS — D631 Anemia in chronic kidney disease: Secondary | ICD-10-CM

## 2019-08-10 DIAGNOSIS — G40802 Other epilepsy, not intractable, without status epilepticus: Secondary | ICD-10-CM | POA: Diagnosis not present

## 2019-08-10 DIAGNOSIS — K219 Gastro-esophageal reflux disease without esophagitis: Secondary | ICD-10-CM | POA: Insufficient documentation

## 2019-08-10 DIAGNOSIS — E669 Obesity, unspecified: Secondary | ICD-10-CM | POA: Diagnosis not present

## 2019-08-10 DIAGNOSIS — Z833 Family history of diabetes mellitus: Secondary | ICD-10-CM | POA: Insufficient documentation

## 2019-08-10 DIAGNOSIS — Z8249 Family history of ischemic heart disease and other diseases of the circulatory system: Secondary | ICD-10-CM | POA: Insufficient documentation

## 2019-08-10 DIAGNOSIS — Z79899 Other long term (current) drug therapy: Secondary | ICD-10-CM | POA: Insufficient documentation

## 2019-08-10 DIAGNOSIS — E1122 Type 2 diabetes mellitus with diabetic chronic kidney disease: Secondary | ICD-10-CM | POA: Diagnosis not present

## 2019-08-10 DIAGNOSIS — D649 Anemia, unspecified: Secondary | ICD-10-CM | POA: Insufficient documentation

## 2019-08-10 DIAGNOSIS — Z87440 Personal history of urinary (tract) infections: Secondary | ICD-10-CM | POA: Insufficient documentation

## 2019-08-10 DIAGNOSIS — Z85828 Personal history of other malignant neoplasm of skin: Secondary | ICD-10-CM | POA: Diagnosis not present

## 2019-08-10 DIAGNOSIS — Z79890 Hormone replacement therapy (postmenopausal): Secondary | ICD-10-CM | POA: Insufficient documentation

## 2019-08-10 DIAGNOSIS — D508 Other iron deficiency anemias: Secondary | ICD-10-CM

## 2019-08-10 DIAGNOSIS — Z7982 Long term (current) use of aspirin: Secondary | ICD-10-CM | POA: Diagnosis not present

## 2019-08-10 DIAGNOSIS — H8109 Meniere's disease, unspecified ear: Secondary | ICD-10-CM | POA: Diagnosis not present

## 2019-08-10 DIAGNOSIS — N184 Chronic kidney disease, stage 4 (severe): Secondary | ICD-10-CM | POA: Insufficient documentation

## 2019-08-10 DIAGNOSIS — E7800 Pure hypercholesterolemia, unspecified: Secondary | ICD-10-CM | POA: Insufficient documentation

## 2019-08-10 DIAGNOSIS — Z794 Long term (current) use of insulin: Secondary | ICD-10-CM | POA: Insufficient documentation

## 2019-08-10 DIAGNOSIS — E559 Vitamin D deficiency, unspecified: Secondary | ICD-10-CM | POA: Insufficient documentation

## 2019-08-10 DIAGNOSIS — Z8701 Personal history of pneumonia (recurrent): Secondary | ICD-10-CM | POA: Diagnosis not present

## 2019-08-10 DIAGNOSIS — E039 Hypothyroidism, unspecified: Secondary | ICD-10-CM | POA: Insufficient documentation

## 2019-08-10 DIAGNOSIS — I129 Hypertensive chronic kidney disease with stage 1 through stage 4 chronic kidney disease, or unspecified chronic kidney disease: Secondary | ICD-10-CM | POA: Insufficient documentation

## 2019-08-10 DIAGNOSIS — M159 Polyosteoarthritis, unspecified: Secondary | ICD-10-CM | POA: Diagnosis not present

## 2019-08-10 DIAGNOSIS — Z87891 Personal history of nicotine dependence: Secondary | ICD-10-CM | POA: Insufficient documentation

## 2019-08-10 LAB — CBC WITH DIFFERENTIAL/PLATELET
Abs Immature Granulocytes: 0.04 10*3/uL (ref 0.00–0.07)
Basophils Absolute: 0 10*3/uL (ref 0.0–0.1)
Basophils Relative: 1 %
Eosinophils Absolute: 0.8 10*3/uL — ABNORMAL HIGH (ref 0.0–0.5)
Eosinophils Relative: 13 %
HCT: 40.3 % (ref 36.0–46.0)
Hemoglobin: 11.9 g/dL — ABNORMAL LOW (ref 12.0–15.0)
Immature Granulocytes: 1 %
Lymphocytes Relative: 12 %
Lymphs Abs: 0.7 10*3/uL (ref 0.7–4.0)
MCH: 26.4 pg (ref 26.0–34.0)
MCHC: 29.5 g/dL — ABNORMAL LOW (ref 30.0–36.0)
MCV: 89.6 fL (ref 80.0–100.0)
Monocytes Absolute: 0.5 10*3/uL (ref 0.1–1.0)
Monocytes Relative: 8 %
Neutro Abs: 4 10*3/uL (ref 1.7–7.7)
Neutrophils Relative %: 65 %
Platelets: 414 10*3/uL — ABNORMAL HIGH (ref 150–400)
RBC: 4.5 MIL/uL (ref 3.87–5.11)
RDW: 19.3 % — ABNORMAL HIGH (ref 11.5–15.5)
WBC: 6 10*3/uL (ref 4.0–10.5)
nRBC: 0 % (ref 0.0–0.2)

## 2019-08-10 LAB — GLUCOSE, CAPILLARY
Glucose-Capillary: 137 mg/dL — ABNORMAL HIGH (ref 70–99)
Glucose-Capillary: 125 mg/dL — ABNORMAL HIGH (ref 70–99)

## 2019-08-10 MED ORDER — MIDAZOLAM HCL 2 MG/2ML IJ SOLN
INTRAMUSCULAR | Status: AC
  Filled 2019-08-10: qty 4

## 2019-08-10 MED ORDER — FENTANYL CITRATE (PF) 100 MCG/2ML IJ SOLN
INTRAMUSCULAR | Status: AC | PRN
  Administered 2019-08-10: 50 ug via INTRAVENOUS

## 2019-08-10 MED ORDER — LIDOCAINE-EPINEPHRINE (PF) 1 %-1:200000 IJ SOLN
INTRAMUSCULAR | Status: AC | PRN
  Administered 2019-08-10: 10 mL via INTRADERMAL

## 2019-08-10 MED ORDER — FENTANYL CITRATE (PF) 100 MCG/2ML IJ SOLN
INTRAMUSCULAR | Status: AC
  Filled 2019-08-10: qty 2

## 2019-08-10 MED ORDER — SODIUM CHLORIDE 0.9 % IV SOLN: INTRAVENOUS | Status: DC

## 2019-08-10 MED ORDER — NALOXONE HCL 0.4 MG/ML IJ SOLN
INTRAMUSCULAR | Status: AC
  Filled 2019-08-10: qty 1

## 2019-08-10 MED ORDER — MIDAZOLAM HCL 2 MG/2ML IJ SOLN
INTRAMUSCULAR | Status: AC | PRN
  Administered 2019-08-10 (×2): 1 mg via INTRAVENOUS

## 2019-08-10 MED ORDER — FLUMAZENIL 0.5 MG/5ML IV SOLN
INTRAVENOUS | Status: AC
  Filled 2019-08-10: qty 5

## 2019-08-10 NOTE — Procedures (Signed)
Pre-procedure Diagnosis: Evaluate for MDS Post-procedure Diagnosis: Same  Technically successful CT guided bone marrow aspiration and biopsy of left iliac crest.   Complications: None Immediate  EBL: None  Signed: Sandi Mariscal Pager: [phone] 08/10/2019, 11:28 AM

## 2019-08-10 NOTE — Consult Note (Signed)
Chief Complaint: Progressive anemia bone marrow biopsy for diagnosis (CKD versus MDS)   Referring Physician(s): Dr. Carolyne Fiscal  Supervising Physician: Sandi Mariscal  Patient Status: [facility] - Out-pt  History of Present Illness: [name] [name] is a 86 y.o. female History of DM, CKD stage 4, progressive anemia. Team is requesting bone marrow biopsy for further determination of CKD anemia versus myelodysplastic syndrome.   Past Medical History:  Diagnosis Date  . Anemia, unspecified   . Arthritis    "knees; right shoulder" (09/15/2013)  . Basal cell carcinoma    "right upper outer lip"  . DM neuropathy, type II diabetes mellitus ([place])   . Epilepsy ([place])   . GERD (gastroesophageal reflux disease)   . Gout   . High cholesterol   . Hypertension   . Hypothyroidism   . Meniere's disease   . Obesity (BMI 30-39.9)   . Other forms of epilepsy and recurrent seizures without mention of intractable epilepsy 11/04/2012   Non convulsive paroxysmal spells, responding to [facility], patient not driving.   . Pneumonia ~ 1943  . Skin cancer    "forehead; right hand"  . Type II or unspecified type diabetes mellitus without mention of complication, not stated as uncontrolled   . UTI (urinary tract infection)   . Vitamin D deficiency     Past Surgical History:  Procedure Laterality Date  . CERVICAL POLYPECTOMY    . HAMMER TOE SURGERY Left 1980's  . MOHS SURGERY  20087   "right upper outer lip"  . TONSILLECTOMY  ~ 1947  . WRIST SURGERY Left ~ 1950   "ran arm thru window"    Allergies: Ppd [tuberculin purified protein derivative]  Medications: Prior to Admission medications   Medication Sig Start Date End Date Taking? Authorizing Provider  acetaminophen (TYLENOL) 500 MG tablet Take 1,000 mg by mouth every 6 (six) hours as needed for moderate pain.    [provider]  aspirin 81 MG tablet Take 81 mg by mouth daily.    [provider]  atenolol (TENORMIN) 50 MG tablet  Take 1 tablet (50 mg total) by mouth daily. Take 1 tablet Daily for BP 02/18/19   Unk Pinto, MD  cholecalciferol (VITAMIN D3) 25 MCG (1000 UNIT) tablet Take 1,000 Units by mouth daily. Per Nephrologist, start taking 1000 units, but patient taking 5000 units presently.    [provider]  colchicine 0.6 MG tablet Take two tablets 1.71m now and second tablet 1 hour later. 08/03/19   MGarnet Sierras NP  fish oil-omega-3 fatty acids 1000 MG capsule Take 2 g by mouth at bedtime.     [provider]  Flaxseed, Linseed, (FLAX SEED OIL) 1000 MG CAPS Take 1 capsule by mouth 3 (three) times daily.    [provider]  furosemide (LASIX) 40 MG tablet Take one tablet daily or as directed. 05/05/19   CLiane Comber NP  glucose blood (ONETOUCH VERIO) test strip Check blood sugar 3 times daily-DX-E11.22 04/05/19   MUnk Pinto MD  insulin NPH-regular Human (NOVOLIN 70/30) (70-30) 100 UNIT/ML injection Takes 28 units qam and 24-26 units qpm 07/12/19   CLiane Comber NP  lansoprazole (PREVACID) 30 MG capsule Take 1 capsule Daily for Indigestion & Heartburn 10/07/18   MUnk Pinto MD  levETIRAcetam (KEPPRA) 500 MG tablet Take 1 tablet 3 x /day after Meals to Prevent Seizures 01/12/19   MUnk Pinto MD  levothyroxine (SYNTHROID) 100 MCG tablet Take 1 tablet daily on an empty stomach with only  water for 30 minutes & no Antacid meds, Calcium or Magnesium for 4 hours & avoid Biotin 07/26/19   Unk Pinto, MD  loratadine (CLARITIN) 10 MG tablet Take 10 mg by mouth daily.    [provider]  meclizine (ANTIVERT) 25 MG tablet TAKE ONE TABLET BY MOUTH UP TO THREE TIMES PER DAY IF NEEDED FOR DIZZINESS/VERTIGO 07/26/19   Unk Pinto, MD  rOPINIRole (REQUIP) 1 MG tablet Take 1/2 to 1 tablet 3 x /day as needed for Restless Legs & Cramps 04/05/19   Unk Pinto, MD  vitamin B-12 (CYANOCOBALAMIN) 100 MCG tablet Take 500 mcg by mouth daily.     [provider]   vitamin C (ASCORBIC ACID) 500 MG tablet Take 1,000 mg by mouth 3 (three) times daily.     [provider]     Family History  Problem Relation Age of Onset  . Heart disease Mother   . Stroke Mother   . Heart disease Father   . Ovarian cancer Sister   . Hypertension Son   . Hypertension Son   . Diabetes Son   . Alcohol abuse Son     Social History   Socioeconomic History  . Marital status: Widowed    Spouse name: Not on file  . Number of children: 3  . Years of education: 39  . Highest education level: Not on file  Occupational History  . Occupation: retired  Tobacco Use  . Smoking status: Former Smoker    Packs/day: 1.00    Years: 30.00    Pack years: 30.00    Types: Cigarettes    Quit date: 06/29/1983    Years since quitting: 36.1  . Smokeless tobacco: Never Used  Substance and Sexual Activity  . Alcohol use: Yes    Alcohol/week: 0.0 standard drinks    Comment: 09/15/2013 "glass of wine maybe once/year"  . Drug use: No  . Sexual activity: Not Currently  Other Topics Concern  . Not on file  Social History Narrative   Patient lives at home alone and she is widowed.   Retired.   Education some college.   Right handed.   Caffeine sometimes not daily.    Social Determinants of Health   Financial Resource Strain:   . Difficulty of Paying Living Expenses:   Food Insecurity:   . Worried About Charity fundraiser in the Last Year:   . Arboriculturist in the Last Year:   Transportation Needs:   . Film/video editor (Medical):   Marland Kitchen Lack of Transportation (Non-Medical):   Physical Activity:   . Days of Exercise per Week:   . Minutes of Exercise per Session:   Stress:   . Feeling of Stress :   Social Connections:   . Frequency of Communication with Friends and Family:   . Frequency of Social Gatherings with Friends and Family:   . Attends Religious Services:   . Active Member of Clubs or Organizations:   . Attends Archivist Meetings:     Marland Kitchen Marital Status:      Review of Systems: A 12 point ROS discussed and pertinent positives are indicated in the HPI above.  All other systems are negative.  Review of Systems  Constitutional: Negative for fatigue and fever.  HENT: Negative for congestion.   Respiratory: Negative for cough and shortness of breath.   Gastrointestinal: Negative for abdominal pain, diarrhea, nausea and vomiting.  Musculoskeletal: Positive for myalgias. Joint swelling:  bilateral hands.  Vital Signs: There were no vitals taken for this visit.  Physical Exam Vitals and nursing note reviewed.  Constitutional:      Appearance: She is well-developed.  HENT:     Head: Normocephalic and atraumatic.  Eyes:     Conjunctiva/sclera: Conjunctivae normal.  Cardiovascular:     Rate and Rhythm: Normal rate. Rhythm irregular.     Heart sounds: Normal heart sounds.  Pulmonary:     Effort: Pulmonary effort is normal.     Breath sounds: Normal breath sounds.  Musculoskeletal:     Cervical back: Normal range of motion.  Neurological:     Mental Status: She is alert and oriented to person, place, and time.     Imaging: No results found.  Labs:  CBC: Recent Labs    05/17/19 1146 06/30/19 0936 07/12/19 1010 08/03/19 1209  WBC 6.9 6.6 5.1 8.2  HGB 10.9* 8.4* 10.4* 11.8  HCT 36.7 31.2* 35.3 39.4  PLT 467* 448* 382 500*    COAGS: No results for input(s): INR, APTT in the last 8760 hours.  BMP: Recent Labs    05/17/19 1146 06/30/19 0936 07/12/19 1010 08/03/19 1209  NA 143 142 142 139  K 4.2 4.2 4.8 4.5  CL 114* 115* 111* 107  CO2 19* 15* 24 22  GLUCOSE 194* 212* 121* 205*  BUN 42* 44* 32* 47*  CALCIUM 9.3 9.4 10.0 11.1*  CREATININE 1.99* 2.45* 1.80* 1.71*  GFRNONAA 22* 17* 25* 27*  GFRAA 26* 20* 29* 31*    LIVER FUNCTION TESTS: Recent Labs    05/03/19 1120 05/03/19 1120 05/17/19 1146 06/30/19 0936 07/12/19 1010 08/03/19 1209  BILITOT 0.3   < > 0.4 0.5 0.4 0.3  AST 17   < >  14* 13* 12 13  ALT 12   < > '14 16 11 12  ' ALKPHOS 47  --  57 64  --   --   PROT 6.4*   < > 6.6 6.9 6.8 7.2  ALBUMIN 4.0  --  3.9 4.0  --   --    < > = values in this interval not displayed.    Assessment and Plan:  86 y.o, female outpatient. History of DM, CKD stage 4, progressive anemia. Team is requesting bone marrow biopsy for further determination of CKD anemia versus myelodysplastic syndrome.  Pertinent Imaging none  Pertinent IR History none  Pertinent Allergies none   All labs (From 6.15.21) and medications are within acceptable parameters.  Patient is afebrile.  Risks and benefits of bone marrow biopsy was discussed with the patient and/or patient's family including, but not limited to bleeding, infection, damage to adjacent structures or low yield requiring additional tests.  All of the questions were answered and there is agreement to proceed.  Consent signed and in chart.   Thank you for this interesting consult.  I greatly enjoyed meeting [name] [name] and look forward to participating in their care.  A copy of this report was sent to the requesting provider on this date.  Electronically Signed: Avel Peace, NP 08/10/2019, 7:59 AM   I spent a total of  40 Minutes   in face to face in clinical consultation, greater than 50% of which was counseling/coordinating care for bone marrow biopsy

## 2019-08-10 NOTE — Discharge Instructions (Addendum)
Bone Marrow Aspiration and Bone Marrow Biopsy, Adult, Care After  Urgent needs - IR on call MD [phone]  Wound - May remove dressing and shower in 24 to 48 hours.  Keep site clean and dry.  Replace with bandaid. Do not submerge in tub or water until scab formed  This sheet gives you information about how to care for yourself after your procedure. Your health care provider may also give you more specific instructions. If you have problems or questions, contact your health care provider. What can I expect after the procedure? After the procedure, it is common to have:  Mild pain and tenderness.  Swelling.  Bruising. Follow these instructions at home: Puncture site care  Follow instructions from your health care provider about how to take care of the puncture site. Make sure you: ? Wash your hands with soap and water before and after you change your bandage (dressing). If soap and water are not available, use hand sanitizer. ? Change your dressing as told by your health care provider.  Check your puncture site every day for signs of infection. Check for: ? More redness, swelling, or pain. ? Fluid or blood. ? Warmth. ? Pus or a bad smell. Activity  Return to your normal activities as told by your health care provider. Ask your health care provider what activities are safe for you.  Do not lift anything that is heavier than 10 lb (4.5 kg), or the limit that you are told, until your health care provider says that it is safe.  Do not drive for 24 hours if you were given a sedative during your procedure. General instructions  Take over-the-counter and prescription medicines only as told by your health care provider.  Do not take baths, swim, or use a hot tub until your health care provider approves. Ask your health care provider if you may take showers. You may only be allowed to take sponge baths.  If directed, put ice on the affected area. To do this: ? Put ice in a plastic  bag. ? Place a towel between your skin and the bag. ? Leave the ice on for 20 minutes, 2-3 times a day.  Keep all follow-up visits as told by your health care provider. This is important. Contact a health care provider if:  Your pain is not controlled with medicine.  You have a fever.  You have more redness, swelling, or pain around the puncture site.  You have fluid or blood coming from the puncture site.  Your puncture site feels warm to the touch.  You have pus or a bad smell coming from the puncture site. Summary  After the procedure, it is common to have mild pain, tenderness, swelling, and bruising.  Follow instructions from your health care provider about how to take care of the puncture site and what activities are safe for you.  Take over-the-counter and prescription medicines only as told by your health care provider.  Contact a health care provider if you have any signs of infection, such as fluid or blood coming from the puncture site. This information is not intended to replace advice given to you by your health care provider. Make sure you discuss any questions you have with your health care provider. Document Revised: 06/23/2018 Document Reviewed: 06/23/2018 [employer] Patient Education  [employer]. Moderate Conscious Sedation, Adult, Care After These instructions provide you with information about caring for yourself after your procedure. Your health care provider may also give you more  specific instructions. Your treatment has been planned according to current medical practices, but problems sometimes occur. Call your health care provider if you have any problems or questions after your procedure. What can I expect after the procedure? After your procedure, it is common:  To feel sleepy for several hours.  To feel clumsy and have poor balance for several hours.  To have poor judgment for several hours.  To vomit if you eat too soon. Follow these  instructions at home: For at least 24 hours after the procedure:  Do not: ? Participate in activities where you could fall or become injured. ? Drive. ? Use heavy machinery. ? Drink alcohol. ? Take sleeping pills or medicines that cause drowsiness. ? Make important decisions or sign legal documents. ? Take care of children on your own.  Rest. Eating and drinking  Follow the diet recommended by your health care provider.  If you vomit: ? Drink water, juice, or soup when you can drink without vomiting. ? Make sure you have little or no nausea before eating solid foods. General instructions  Have a responsible adult stay with you until you are awake and alert.  Take over-the-counter and prescription medicines only as told by your health care provider.  If you smoke, do not smoke without supervision.  Keep all follow-up visits as told by your health care provider. This is important. Contact a health care provider if:  You keep feeling nauseous or you keep vomiting.  You feel light-headed.  You develop a rash.  You have a fever. Get help right away if:  You have trouble breathing. This information is not intended to replace advice given to you by your health care provider. Make sure you discuss any questions you have with your health care provider. Document Revised: 01/17/2017 Document Reviewed: 05/27/2015 Elsevier Patient Education  2020 [employer].

## 2019-08-12 LAB — SURGICAL PATHOLOGY

## 2019-08-16 ENCOUNTER — Other Ambulatory Visit: Payer: Self-pay | Admitting: Internal Medicine

## 2019-08-16 DIAGNOSIS — E039 Hypothyroidism, unspecified: Secondary | ICD-10-CM

## 2019-08-16 DIAGNOSIS — K219 Gastro-esophageal reflux disease without esophagitis: Secondary | ICD-10-CM

## 2019-08-16 MED ORDER — LEVOTHYROXINE SODIUM 100 MCG PO TABS: ORAL_TABLET | ORAL | 0 refills | Status: DC

## 2019-08-16 MED ORDER — LANSOPRAZOLE 30 MG PO CPDR: DELAYED_RELEASE_CAPSULE | ORAL | 0 refills | Status: DC

## 2019-08-18 ENCOUNTER — Encounter (HOSPITAL_COMMUNITY): Payer: Self-pay | Admitting: Hematology

## 2019-08-25 ENCOUNTER — Inpatient Hospital Stay: Payer: Medicare Other | Attending: Hematology

## 2019-08-25 ENCOUNTER — Inpatient Hospital Stay: Payer: Medicare Other | Admitting: Hematology

## 2019-08-25 ENCOUNTER — Other Ambulatory Visit: Payer: Self-pay

## 2019-08-25 VITALS — BP 123/52 | HR 70 | Temp 97.0°F | Resp 16 | Ht 62.0 in | Wt 199.2 lb

## 2019-08-25 DIAGNOSIS — D509 Iron deficiency anemia, unspecified: Secondary | ICD-10-CM

## 2019-08-25 DIAGNOSIS — N184 Chronic kidney disease, stage 4 (severe): Secondary | ICD-10-CM | POA: Diagnosis not present

## 2019-08-25 DIAGNOSIS — D631 Anemia in chronic kidney disease: Secondary | ICD-10-CM

## 2019-08-25 DIAGNOSIS — D508 Other iron deficiency anemias: Secondary | ICD-10-CM

## 2019-08-25 DIAGNOSIS — N189 Chronic kidney disease, unspecified: Secondary | ICD-10-CM | POA: Diagnosis not present

## 2019-08-25 LAB — IRON AND TIBC
Iron: 44 ug/dL (ref 41–142)
Saturation Ratios: 25 % (ref 21–57)
TIBC: 176 ug/dL — ABNORMAL LOW (ref 236–444)
UIBC: 133 ug/dL (ref 120–384)

## 2019-08-25 LAB — FERRITIN: Ferritin: 353 ng/mL — ABNORMAL HIGH (ref 11–307)

## 2019-08-25 LAB — CBC WITH DIFFERENTIAL/PLATELET
Abs Immature Granulocytes: 0.02 10*3/uL (ref 0.00–0.07)
Basophils Absolute: 0 10*3/uL (ref 0.0–0.1)
Basophils Relative: 1 %
Eosinophils Absolute: 0.6 10*3/uL — ABNORMAL HIGH (ref 0.0–0.5)
Eosinophils Relative: 11 %
HCT: 37.7 % (ref 36.0–46.0)
Hemoglobin: 11.2 g/dL — ABNORMAL LOW (ref 12.0–15.0)
Immature Granulocytes: 0 %
Lymphocytes Relative: 12 %
Lymphs Abs: 0.7 10*3/uL (ref 0.7–4.0)
MCH: 26.4 pg (ref 26.0–34.0)
MCHC: 29.7 g/dL — ABNORMAL LOW (ref 30.0–36.0)
MCV: 88.7 fL (ref 80.0–100.0)
Monocytes Absolute: 0.4 10*3/uL (ref 0.1–1.0)
Monocytes Relative: 7 %
Neutro Abs: 4 10*3/uL (ref 1.7–7.7)
Neutrophils Relative %: 69 %
Platelets: 399 10*3/uL (ref 150–400)
RBC: 4.25 MIL/uL (ref 3.87–5.11)
RDW: 19.6 % — ABNORMAL HIGH (ref 11.5–15.5)
WBC: 5.8 10*3/uL (ref 4.0–10.5)
nRBC: 0 % (ref 0.0–0.2)

## 2019-08-25 LAB — CMP (CANCER CENTER ONLY)
ALT: 16 U/L (ref 0–44)
AST: 14 U/L — ABNORMAL LOW (ref 15–41)
Albumin: 3.9 g/dL (ref 3.5–5.0)
Alkaline Phosphatase: 79 U/L (ref 38–126)
Anion gap: 12 (ref 5–15)
BUN: 43 mg/dL — ABNORMAL HIGH (ref 8–23)
CO2: 20 mmol/L — ABNORMAL LOW (ref 22–32)
Calcium: 9.8 mg/dL (ref 8.9–10.3)
Chloride: 109 mmol/L (ref 98–111)
Creatinine: 1.88 mg/dL — ABNORMAL HIGH (ref 0.44–1.00)
GFR, Est AFR Am: 28 mL/min — ABNORMAL LOW (ref >60)
GFR, Estimated: 24 mL/min — ABNORMAL LOW (ref >60)
Glucose, Bld: 168 mg/dL — ABNORMAL HIGH (ref 70–99)
Potassium: 4.7 mmol/L (ref 3.5–5.1)
Sodium: 141 mmol/L (ref 135–145)
Total Bilirubin: 0.3 mg/dL (ref 0.3–1.2)
Total Protein: 7.2 g/dL (ref 6.5–8.1)
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
## 2019-08-26 LAB — ERYTHROPOIETIN: Erythropoietin: 10.9 m[IU]/mL (ref 2.6–18.5)

## 2019-09-04 ENCOUNTER — Emergency Department (HOSPITAL_COMMUNITY)
Admission: EM | Admit: 2019-09-04 | Discharge: 2019-09-04 | Disposition: A | Discharge status: Home / Self Care | Payer: Medicare Other | Attending: Emergency Medicine | Admitting: Emergency Medicine

## 2019-09-04 ENCOUNTER — Other Ambulatory Visit: Payer: Self-pay

## 2019-09-04 ENCOUNTER — Encounter (HOSPITAL_COMMUNITY): Payer: Self-pay | Admitting: *Deleted

## 2019-09-04 DIAGNOSIS — Z87891 Personal history of nicotine dependence: Secondary | ICD-10-CM | POA: Insufficient documentation

## 2019-09-04 DIAGNOSIS — N184 Chronic kidney disease, stage 4 (severe): Secondary | ICD-10-CM | POA: Diagnosis not present

## 2019-09-04 DIAGNOSIS — R350 Frequency of micturition: Secondary | ICD-10-CM | POA: Diagnosis not present

## 2019-09-04 DIAGNOSIS — E039 Hypothyroidism, unspecified: Secondary | ICD-10-CM | POA: Diagnosis not present

## 2019-09-04 DIAGNOSIS — I129 Hypertensive chronic kidney disease with stage 1 through stage 4 chronic kidney disease, or unspecified chronic kidney disease: Secondary | ICD-10-CM | POA: Insufficient documentation

## 2019-09-04 DIAGNOSIS — Z7982 Long term (current) use of aspirin: Secondary | ICD-10-CM | POA: Diagnosis not present

## 2019-09-04 DIAGNOSIS — D649 Anemia, unspecified: Secondary | ICD-10-CM | POA: Diagnosis not present

## 2019-09-04 DIAGNOSIS — R6883 Chills (without fever): Secondary | ICD-10-CM | POA: Diagnosis present

## 2019-09-04 DIAGNOSIS — N390 Urinary tract infection, site not specified: Secondary | ICD-10-CM

## 2019-09-04 DIAGNOSIS — R3915 Urgency of urination: Secondary | ICD-10-CM | POA: Diagnosis not present

## 2019-09-04 DIAGNOSIS — E1140 Type 2 diabetes mellitus with diabetic neuropathy, unspecified: Secondary | ICD-10-CM | POA: Insufficient documentation

## 2019-09-04 DIAGNOSIS — N289 Disorder of kidney and ureter, unspecified: Secondary | ICD-10-CM | POA: Diagnosis not present

## 2019-09-04 DIAGNOSIS — E1122 Type 2 diabetes mellitus with diabetic chronic kidney disease: Secondary | ICD-10-CM | POA: Insufficient documentation

## 2019-09-04 DIAGNOSIS — Z79899 Other long term (current) drug therapy: Secondary | ICD-10-CM | POA: Diagnosis not present

## 2019-09-04 DIAGNOSIS — R300 Dysuria: Secondary | ICD-10-CM | POA: Insufficient documentation

## 2019-09-04 LAB — CBC
HCT: 37.1 % (ref 36.0–46.0)
Hemoglobin: 11 g/dL — ABNORMAL LOW (ref 12.0–15.0)
MCH: 25.9 pg — ABNORMAL LOW (ref 26.0–34.0)
MCHC: 29.6 g/dL — ABNORMAL LOW (ref 30.0–36.0)
MCV: 87.5 fL (ref 80.0–100.0)
Platelets: 354 10*3/uL (ref 150–400)
RBC: 4.24 MIL/uL (ref 3.87–5.11)
RDW: 19.9 % — ABNORMAL HIGH (ref 11.5–15.5)
WBC: 8.6 10*3/uL (ref 4.0–10.5)
nRBC: 0 % (ref 0.0–0.2)

## 2019-09-04 LAB — COMPREHENSIVE METABOLIC PANEL
ALT: 13 U/L (ref 0–44)
AST: 15 U/L (ref 15–41)
Albumin: 3.6 g/dL (ref 3.5–5.0)
Alkaline Phosphatase: 73 U/L (ref 38–126)
Anion gap: 11 (ref 5–15)
BUN: 47 mg/dL — ABNORMAL HIGH (ref 8–23)
CO2: 19 mmol/L — ABNORMAL LOW (ref 22–32)
Calcium: 9.3 mg/dL (ref 8.9–10.3)
Chloride: 107 mmol/L (ref 98–111)
Creatinine, Ser: 1.99 mg/dL — ABNORMAL HIGH (ref 0.44–1.00)
GFR calc Af Amer: 26 mL/min — ABNORMAL LOW (ref >60)
GFR calc non Af Amer: 22 mL/min — ABNORMAL LOW (ref >60)
Glucose, Bld: 303 mg/dL — ABNORMAL HIGH (ref 70–99)
Potassium: 4.5 mmol/L (ref 3.5–5.1)
Sodium: 137 mmol/L (ref 135–145)
Total Bilirubin: 0.3 mg/dL (ref 0.3–1.2)
Total Protein: 6.4 g/dL — ABNORMAL LOW (ref 6.5–8.1)

## 2019-09-04 LAB — URINALYSIS, ROUTINE W REFLEX MICROSCOPIC
Bilirubin Urine: NEGATIVE
Glucose, UA: NEGATIVE mg/dL
Ketones, ur: NEGATIVE mg/dL
Nitrite: NEGATIVE
Protein, ur: 100 mg/dL — AB
Specific Gravity, Urine: 1.011 (ref 1.005–1.030)
WBC, UA: >50 WBC/hpf — ABNORMAL HIGH (ref 0–5)
pH: 5 (ref 5.0–8.0)

## 2019-09-04 LAB — LIPASE, BLOOD: Lipase: 42 U/L (ref 11–51)

## 2019-09-04 MED ORDER — CEPHALEXIN 250 MG PO CAPS
500.0000 mg | ORAL_CAPSULE | Freq: Once | ORAL | Status: AC
  Administered 2019-09-04: 500 mg via ORAL
  Filled 2019-09-04: qty 2

## 2019-09-04 MED ORDER — SODIUM CHLORIDE 0.9% FLUSH: 3.0000 mL | Freq: Once | INTRAVENOUS | Status: DC

## 2019-09-04 MED ORDER — CEPHALEXIN 500 MG PO CAPS
500.0000 mg | ORAL_CAPSULE | Freq: Three times a day (TID) | ORAL | 0 refills | Status: DC
Start: 2019-09-04 — End: 2019-09-29

## 2019-09-04 NOTE — ED Triage Notes (Signed)
Before ems arrived  The pt reports that she was sweating and shaking all over  The pt had eaten most of her meal

## 2019-09-04 NOTE — ED Triage Notes (Signed)
Ems checked  Her cbg and it was 163

## 2019-09-04 NOTE — ED Triage Notes (Signed)
The pt  Was brought in by [facility] .  The pt jus finished eating at a restaurant  When she began to get chilled  On the way home in the car the  Seats were heated  And the pt reports that she checked her glucose and it was 102  She took insulin 70/30   The pt thinks she has a uti  She is still chilled and feeling very cold iv  Per ems

## 2019-09-04 NOTE — ED Notes (Signed)
575ml of nss  Given by gems on the way here

## 2019-09-04 NOTE — ED Provider Notes (Signed)
[facility] EMERGENCY DEPARTMENT Provider Note   CSN: [id number] Arrival date & time: 09/04/19  1947    History Chief Complaint  Patient presents with   Chills    [name] [name] is a 86 y.o. female.  The history is provided by the patient.  She has history of hypertension, hyperlipidemia, diabetes, chronic kidney disease and comes in because of chills which started this evening.  She was not aware of any fever and did not break out in a sweat.  She denies rhinorrhea, sore throat, cough.  She denies nausea, vomiting, diarrhea.  She has had some urinary urgency, frequency, tenesmus, dysuria.  She denies any flank pain or myalgias.  Past Medical History:  Diagnosis Date   Anemia, unspecified    Arthritis    "knees; right shoulder" (09/15/2013)   Basal cell carcinoma    "right upper outer lip"   Chronic kidney disease    DM neuropathy, type II diabetes mellitus ([place])    Dyspnea 2021   with low iron   Epilepsy (HCC)    GERD (gastroesophageal reflux disease)    Gout    High cholesterol    Hypertension    Hypothyroidism    Meniere's disease    Obesity (BMI 30-39.9)    Other forms of epilepsy and recurrent seizures without mention of intractable epilepsy 11/04/2012   Non convulsive paroxysmal spells, responding to [facility], patient not driving.    Pneumonia ~ 1943   Skin cancer    "forehead; right hand"   Type II or unspecified type diabetes mellitus without mention of complication, not stated as uncontrolled    UTI (urinary tract infection)    Vitamin D deficiency     Patient Active Problem List   Diagnosis Date Noted   Iron deficiency anemia 05/10/2019   Secondary hyperparathyroidism of renal origin ([place]) 09/20/2018   Type 2 diabetes mellitus with stage 4 chronic kidney disease, with long-term current use of insulin ([place]) 12/08/2017   ACE inhibitor intolerance 12/08/2017   Vertigo of central origin 11/20/2015   Morbid obesity  ([place]) BMI 35+ with T2DM, htn, hyperlipidemia, CKD, OSA 12/12/2014   OSA on CPAP 08/08/2014   Type II diabetes mellitus with neurological manifestations ([place]) 09/15/2013   Hyperlipidemia associated with type 2 diabetes mellitus ([place]) 06/27/2013   Vitamin D deficiency 06/27/2013   Seizure disorder ([place]) 11/04/2012   Anemia 06/13/2008   COLONIC POLYPS 06/09/2008   Hypothyroidism 06/09/2008   CKD stage 4 due to type 2 diabetes mellitus ([place]) 06/09/2008   Venous (peripheral) insufficiency 06/09/2008   Gastroesophageal reflux disease 06/09/2008   Essential hypertension 06/09/2008    Past Surgical History:  Procedure Laterality Date   CERVICAL POLYPECTOMY     HAMMER TOE SURGERY Left 1980's   MOHS SURGERY  20087   "right upper outer lip"   TONSILLECTOMY  ~ 1947   WRIST SURGERY Left ~ 1950   "ran arm thru window"     OB History   No obstetric history on file.     Family History  Problem Relation Age of Onset   Heart disease Mother    Stroke Mother    Heart disease Father    Ovarian cancer Sister    Hypertension Son    Hypertension Son    Diabetes Son    Alcohol abuse Son     Social History   Tobacco Use   Smoking status: Former Smoker    Packs/day: 1.00    Years: 30.00  Pack years: 30.00    Types: Cigarettes    Quit date: 06/29/1983    Years since quitting: 36.2   Smokeless tobacco: Never Used  Vaping Use   Vaping Use: Never used  Substance Use Topics   Alcohol use: Yes    Alcohol/week: 0.0 standard drinks    Comment: 09/15/2013 "glass of wine maybe once/year"   Drug use: No    Home Medications Prior to Admission medications   Medication Sig Start Date End Date Taking? Authorizing Provider  acetaminophen (TYLENOL) 500 MG tablet Take 1,000 mg by mouth every 6 (six) hours as needed for moderate pain.    [provider]  aspirin 81 MG tablet Take 81 mg by mouth daily.    [provider]  atenolol (TENORMIN) 50 MG  tablet Take 1 tablet (50 mg total) by mouth daily. Take 1 tablet Daily for BP 02/18/19   Unk Pinto, MD  cholecalciferol (VITAMIN D3) 25 MCG (1000 UNIT) tablet Take 1,000 Units by mouth daily. Per Nephrologist, start taking 1000 units, but patient taking 5000 units presently.    [provider]  colchicine 0.6 MG tablet Take two tablets 1.2mg  now and second tablet 1 hour later. 08/03/19   [name], NP  fish oil-omega-3 fatty acids 1000 MG capsule Take 2 g by mouth at bedtime.     [provider]  Flaxseed, Linseed, (FLAX SEED OIL) 1000 MG CAPS Take 1 capsule by mouth 3 (three) times daily.    [provider]  furosemide (LASIX) 40 MG tablet Take one tablet daily or as directed. 05/05/19   [name], NP  glucose blood (ONETOUCH VERIO) test strip Check blood sugar 3 times daily-DX-E11.22 04/05/19   Unk Pinto, MD  insulin NPH-regular Human (NOVOLIN 70/30) (70-30) 100 UNIT/ML injection Takes 28 units qam and 24-26 units qpm 07/12/19   [name], NP  lansoprazole (PREVACID) 30 MG capsule Take 1 capsule Daily to Prevent Heart burn & Indigestion 08/16/19   Unk Pinto, MD  levETIRAcetam (KEPPRA) 500 MG tablet Take 1 tablet 3 x /day after Meals to Prevent Seizures 01/12/19   Unk Pinto, MD  levothyroxine (SYNTHROID) 100 MCG tablet Take 1 tablet daily on an empty stomach with only water for 30 minutes & no Antacid meds, Calcium or Magnesium for 4 hours & avoid Biotin 08/16/19   Unk Pinto, MD  loratadine (CLARITIN) 10 MG tablet Take 10 mg by mouth daily.    [provider]  meclizine (ANTIVERT) 25 MG tablet TAKE ONE TABLET BY MOUTH UP TO THREE TIMES PER DAY IF NEEDED FOR DIZZINESS/VERTIGO 07/26/19   Unk Pinto, MD  rOPINIRole (REQUIP) 1 MG tablet Take 1/2 to 1 tablet 3 x /day as needed for Restless Legs & Cramps 04/05/19   Unk Pinto, MD  vitamin B-12 (CYANOCOBALAMIN) 100 MCG tablet Take 500 mcg by mouth daily.      [provider]  vitamin C (ASCORBIC ACID) 500 MG tablet Take 1,000 mg by mouth 3 (three) times daily.     [provider]    Allergies    Ppd [tuberculin purified protein derivative]  Review of Systems   Review of Systems  All other systems reviewed and are negative.   Physical Exam Updated Vital Signs BP (!) 120/37 (BP Location: Right Arm)    Pulse 86    Temp 99.2 F (37.3 C) (Oral)    Resp 14    Ht 5\' 2"  (1.575 m)    Wt 90.4 kg  SpO2 96%    BMI 36.45 kg/m   Physical Exam Vitals and nursing note reviewed.   86 year old female, resting comfortably and in no acute distress. Vital signs are normal. Oxygen saturation is 96%, which is normal. Head is normocephalic and atraumatic. PERRLA, EOMI. Oropharynx is clear. Neck is nontender and supple without adenopathy or JVD. Back is nontender and there is no CVA tenderness. Lungs are clear without rales, wheezes, or rhonchi. Chest is nontender. Heart has regular rate and rhythm with 2/6 systolic ejection murmur heard throughout the precordium. Abdomen is soft, flat, nontender without masses or hepatosplenomegaly and peristalsis is normoactive. Extremities have no cyanosis or edema, full range of motion is present. Skin is warm and dry without rash. Neurologic: Mental status is normal, cranial nerves are intact, there are no motor or sensory deficits.  ED Results / Procedures / Treatments   Labs (all labs ordered are listed, but only abnormal results are displayed) Labs Reviewed  COMPREHENSIVE METABOLIC PANEL - Abnormal; Notable for the following components:      Result Value   CO2 19 (*)    Glucose, Bld 303 (*)    BUN 47 (*)    Creatinine, Ser 1.99 (*)    Total Protein 6.4 (*)    GFR calc non Af Amer 22 (*)    GFR calc Af Amer 26 (*)    All other components within normal limits  CBC - Abnormal; Notable for the following components:   Hemoglobin 11.0 (*)    MCH 25.9 (*)    MCHC 29.6 (*)    RDW 19.9 (*)     All other components within normal limits  URINALYSIS, ROUTINE W REFLEX MICROSCOPIC - Abnormal; Notable for the following components:   APPearance CLOUDY (*)    Hgb urine dipstick SMALL (*)    Protein, ur 100 (*)    Leukocytes,Ua LARGE (*)    WBC, UA >50 (*)    Bacteria, UA FEW (*)    All other components within normal limits  LIPASE, BLOOD    EKG EKG Interpretation  Date/Time:  Saturday September 04 2019 19:54:03 EDT Ventricular Rate:  99 PR Interval:  160 QRS Duration: 130 QT Interval:  398 QTC Calculation: 510 R Axis:   -113 Text Interpretation: Normal sinus rhythm Right bundle branch block Possible Lateral infarct , age undetermined Abnormal ECG When compared with ECG of 11/16/2017, No significant change was found Confirmed by Delora Fuel ([id number]) on 09/04/2019 11:00:19 PM  Procedures Procedures   Medications Ordered in ED Medications  sodium chloride flush (NS) 0.9 % injection 3 mL (has no administration in time range)  cephALEXin (KEFLEX) capsule 500 mg (has no administration in time range)    ED Course  I have reviewed the triage vital signs and the nursing notes.  Pertinent lab results that were available during my care of the patient were reviewed by me and considered in my medical decision making (see chart for details).  MDM Rules/Calculators/A&P Chills suggesting infection.  Urinalysis does have significant pyuria, although only few bacteria are seen.  This fits her clinical presentation and she will be treated for UTI.  She is given a prescription for cephalexin and given initial dose in the ED.  ECG is unchanged from prior.  Other labs show stable renal insufficiency and stable anemia.  She is instructed to follow-up with PCP.  Return precautions discussed.  Final Clinical Impression(s) / ED Diagnoses Final diagnoses:  Urinary tract infection without hematuria, site unspecified  Renal insufficiency  Normocytic anemia    Rx / DC Orders ED Discharge Orders          Ordered    cephALEXin (KEFLEX) 500 MG capsule  3 times daily     Discontinue  Reprint     09/04/19 0569           Delora Fuel, MD 79/48/01 2315

## 2019-09-04 NOTE — Discharge Instructions (Addendum)
Return if you are having any problems. 

## 2019-09-07 ENCOUNTER — Telehealth: Payer: Self-pay | Admitting: Hematology

## 2019-09-07 LAB — URINE CULTURE: Culture: >=100000 — AB

## 2019-09-07 NOTE — Telephone Encounter (Signed)
Scheduled per 07/08 los, patient has been called and voicemail was left.

## 2019-09-09 ENCOUNTER — Telehealth: Payer: Self-pay | Admitting: *Deleted

## 2019-09-09 NOTE — Telephone Encounter (Signed)
Post ED Visit - Positive Culture Follow-up  Culture report reviewed by antimicrobial stewardship pharmacist: [facility] Team []  [address], Pharm.D. []  Heide Guile, Pharm.D., BCPS AQ-ID []  Parks Neptune, Pharm.D., BCPS []  Alycia Rossetti, Pharm.D., BCPS []  [place], Pharm.D., BCPS, AAHIVP []  Legrand Como, Pharm.D., BCPS, AAHIVP []  Salome Arnt, PharmD, BCPS []  Johnnette Gourd, PharmD, BCPS []  Hughes Better, PharmD, BCPS []  Leeroy Cha, PharmD []  Laqueta Linden, PharmD, BCPS []  Albertina Parr, PharmD  [place] Team []  Leodis Sias, PharmD []  Lindell Spar, PharmD []  Royetta Asal, PharmD []  Graylin Shiver, Rph []  Rema Fendt) Glennon Mac, PharmD []  Arlyn Dunning, PharmD []  Netta Cedars, PharmD []  Dia Sitter, PharmD []  Leone Haven, PharmD []  Gretta Arab, PharmD []  Theodis Shove, PharmD []  Peggyann Juba, PharmD []  Reuel Boom, PharmD   Positive urine culture Treated with Cephalexin, organism sensitive to the same and no further patient follow-up is required at this time. Haely von Edgardo Roys, PharmD  Harlon Flor Talley 09/09/2019, 10:50 AM

## 2019-09-13 NOTE — Progress Notes (Signed)
Assessment and Plan: Morbid obesity (HCC) BMI 35+ with T2DM, htn, hyperlipidemia, CKD, OSA -     Lancets (ONETOUCH ULTRASOFT) lancets; Insulin dependent check sugars 2-3 x a day- 3 month supply  Acute gout due to renal impairment involving hand, unspecified laterality -     allopurinol (ZYLOPRIM) 100 MG tablet; Take 0.5 tablets (50 mg total) by mouth every other day. -     predniSONE (DELTASONE) 5 MG tablet; 2 tablets daily for 3 days, 1 tablet daily for 4 days. No more, fever, chills for the patient Stop the cochicine due to renal function Will do very low dose of prednisone taper- may need to see ortho to get injection - will start on low dose allopurinol 50 mg every other day  Acute cystitis without hematuria No symptoms at this time  Will follow up in 2 weeks and recheck urine since still on ABX at this time Will check renal function and CBC at that time, if hand is not better will refer to ortho     HPI 86 y.o.female with history of hypothyroidism, DM2 with CKD, anemia, chol, neuropathy, OSA, HTN   presents for follow up from the ER. Patient went to the ER on 09/04/19, for:  UTI- She has one pill left of the cephalexin and states she is feeling better. No fever, chills. Her back pain with the UTI has resolved.   Renal US 02/2018  She has a current gout flare left 2nd DIP, she has history of CKD stage 4, follows with nephology, Dr. Royce Macadamia, was seen 06/15- given cochicine- tried and has not helped. Last uric acid was very elevated.  Lab Results  Component Value Date   LABURIC 11.9 (H) 08/03/2019      Lab Results  Component Value Date   CREATININE 1.99 (H) 09/04/2019   BUN 47 (H) 09/04/2019   NA 137 09/04/2019   K 4.5 09/04/2019   CL 107 09/04/2019   CO2 19 (L) 09/04/2019   Lab Results  Component Value Date   GFRAA 26 (L) 09/04/2019     Past Medical History:  Diagnosis Date  . Anemia, unspecified   . Arthritis    "knees; right shoulder" (09/15/2013)  . Basal cell  carcinoma    "right upper outer lip"  . Chronic kidney disease   . DM neuropathy, type II diabetes mellitus ([place])   . Dyspnea 2021   with low iron  . Epilepsy ([place])   . GERD (gastroesophageal reflux disease)   . Gout   . High cholesterol   . Hypertension   . Hypothyroidism   . Meniere's disease   . Obesity (BMI 30-39.9)   . Other forms of epilepsy and recurrent seizures without mention of intractable epilepsy 11/04/2012   Non convulsive paroxysmal spells, responding to [facility], patient not driving.   . Pneumonia ~ 1943  . Skin cancer    "forehead; right hand"  . Type II or unspecified type diabetes mellitus without mention of complication, not stated as uncontrolled   . UTI (urinary tract infection)   . Vitamin D deficiency      Allergies  Allergen Reactions  . Ppd [Tuberculin Purified Protein Derivative] Other (See Comments)    indurated       Current Outpatient Medications (Endocrine & Metabolic):  .  insulin NPH-regular Human (NOVOLIN 70/30) (70-30) 100 UNIT/ML injection, Takes 28 units qam and 24-26 units qpm .  levothyroxine (SYNTHROID) 100 MCG tablet, Take 1 tablet daily on an empty stomach with only  water for 30 minutes & no Antacid meds, Calcium or Magnesium for 4 hours & avoid Biotin .  predniSONE (DELTASONE) 5 MG tablet, 2 tablets daily for 3 days, 1 tablet daily for 4 days.  Current Outpatient Medications (Cardiovascular):  .  atenolol (TENORMIN) 50 MG tablet, Take 1 tablet (50 mg total) by mouth daily. Take 1 tablet Daily for BP .  furosemide (LASIX) 40 MG tablet, Take one tablet daily or as directed.  Current Outpatient Medications (Respiratory):  .  loratadine (CLARITIN) 10 MG tablet, Take 10 mg by mouth daily.  Current Outpatient Medications (Analgesics):  .  acetaminophen (TYLENOL) 500 MG tablet, Take 1,000 mg by mouth every 6 (six) hours as needed for moderate pain. Marland Kitchen  aspirin 81 MG tablet, Take 81 mg by mouth daily. Marland Kitchen  allopurinol (ZYLOPRIM) 100 MG  tablet, Take 0.5 tablets (50 mg total) by mouth every other day.  Current Outpatient Medications (Hematological):  .  vitamin B-12 (CYANOCOBALAMIN) 100 MCG tablet, Take 500 mcg by mouth daily.   Current Outpatient Medications (Other):  .  cephALEXin (KEFLEX) 500 MG capsule, Take 1 capsule (500 mg total) by mouth 3 (three) times daily. .  cholecalciferol (VITAMIN D3) 25 MCG (1000 UNIT) tablet, Take 1,000 Units by mouth daily. Per Nephrologist, start taking 1000 units, but patient taking 5000 units presently. .  fish oil-omega-3 fatty acids 1000 MG capsule, Take 2 g by mouth at bedtime.  .  Flaxseed, Linseed, (FLAX SEED OIL) 1000 MG CAPS, Take 1 capsule by mouth 3 (three) times daily. Marland Kitchen  glucose blood (ONETOUCH VERIO) test strip, Check blood sugar 3 times daily-DX-E11.22 .  lansoprazole (PREVACID) 30 MG capsule, Take 1 capsule Daily to Prevent Heart burn & Indigestion .  levETIRAcetam (KEPPRA) 500 MG tablet, Take 1 tablet 3 x /day after Meals to Prevent Seizures .  meclizine (ANTIVERT) 25 MG tablet, TAKE ONE TABLET BY MOUTH UP TO THREE TIMES PER DAY IF NEEDED FOR DIZZINESS/VERTIGO .  rOPINIRole (REQUIP) 1 MG tablet, Take 1/2 to 1 tablet 3 x /day as needed for Restless Legs & Cramps .  vitamin C (ASCORBIC ACID) 500 MG tablet, Take 1,000 mg by mouth 3 (three) times daily.  .  Lancets (ONETOUCH ULTRASOFT) lancets, Insulin dependent check sugars 2-3 x a day- 3 month supply  ROS: all negative except above.   Physical Exam: Filed Weights   09/15/19 0937  Weight: 191 lb (86.6 kg)   BP 124/82   Pulse 72   Temp (!) 97.3 F (36.3 C)   Ht 5\' 2"  (1.575 m)   Wt 191 lb (86.6 kg)   SpO2 97%   BMI 34.93 kg/m  General Appearance: Well nourished, in no apparent distress. Eyes: PERRLA, EOMs, conjunctiva no swelling or erythema Sinuses: No Frontal/maxillary tenderness ENT/Mouth: Ext aud canals clear, TMs without erythema, bulging. No erythema, swelling, or exudate on post pharynx.  Tonsils not swollen  or erythematous. Hearing normal.  Neck: Supple, thyroid normal.  Respiratory: Respiratory effort normal, BS equal bilaterally without rales, rhonchi, wheezing or stridor.  Cardio: RRR with no RGs,  2/6 early systolic murmur at L 2nd ICD with radiation to neck. Symmetrical peripheral pulses without notable edema (wearing compression hose).  Abdomen: Soft, + BS.  Non tender, no guarding, rebound, hernias, masses. Lymphatics: Non tender without lymphadenopathy.  Musculoskeletal: Full ROM, Symmetrical strength, Slow gait with cane. She has bony enlargement of bilateral hand PIP/DIP joints, with right 2nd PIP with erythema, warmth, swelling.  Skin: Warm, dry without rashes,  ecchymosis. .  Neuro: Cranial nerves intact. No cerebellar symptoms.  Psych: Awake and oriented X 3, normal affect, Insight and Judgment appropriate.   Vicie Mutters, PA-C 12:13 PM [facility]

## 2019-09-15 ENCOUNTER — Encounter: Payer: Self-pay | Admitting: Physician Assistant

## 2019-09-15 ENCOUNTER — Ambulatory Visit: Payer: Medicare Other | Admitting: Physician Assistant

## 2019-09-15 ENCOUNTER — Other Ambulatory Visit: Payer: Self-pay

## 2019-09-15 DIAGNOSIS — N3000 Acute cystitis without hematuria: Secondary | ICD-10-CM

## 2019-09-15 DIAGNOSIS — M10349 Gout due to renal impairment, unspecified hand: Secondary | ICD-10-CM

## 2019-09-15 MED ORDER — ONETOUCH ULTRASOFT LANCETS MISC: 12 refills | Status: DC

## 2019-09-15 MED ORDER — ALLOPURINOL 100 MG PO TABS: 50.0000 mg | ORAL_TABLET | ORAL | 2 refills | Status: DC

## 2019-09-15 MED ORDER — PREDNISONE 5 MG PO TABS: ORAL_TABLET | ORAL | 0 refills | Status: DC

## 2019-09-15 NOTE — Patient Instructions (Signed)
Going to start you on allopurinol 50 mg every other day or 1/2 of the 100mg  every other day.  Will give you low dose of steroids Need to check kidney function here in 2 weeks or with kidney doctor.

## 2019-09-25 NOTE — Progress Notes (Signed)
Assessment and Plan:  Morbid obesity (HCC) BMI 35+ with T2DM, htn, hyperlipidemia, CKD, OSA - follow up 3 months for progress monitoring - increase veggies, decrease carbs - long discussion about weight loss, diet, and exercise  Acute gout due to renal impairment involving hand, unspecified laterality -     Uric acid Continue allopurinol at this time, may need to increase dose  Acute cystitis without hematuria -     Urinalysis, Routine w reflex microscopic -     Urine Culture No symptoms at this time  CKD stage 4 due to type 2 diabetes mellitus ([place]) -     CBC with Differential/Platelet -     COMPLETE METABOLIC PANEL WITH GFR Will check kidney function before prescribing medications Do not take the colchicine again.   Pain of right heel May be from initiation of allopurinol Will check labs Rule out infection/fracture but more likely gout Will check kidney function before prescribing medications Do not take the colchicine again.  Go to the ER if any worsening symptoms -     Sedimentation rate -     C-reactive protein -     DG Foot Complete Right; Future -     Ambulatory referral to Orthopedic Surgery        HPI 85 y.o.female with history of hypothyroidism, DM2 with CKD, anemia, chol, neuropathy, OSA, HTN   presents for follow up for UTI and gout.   Renal US 02/2018  She had a gout flare left 2nd DIP, she has history of CKD stage 4, follows with nephology, Dr. Royce Macadamia. Her colchicine was discontinued due to her CKD and she was given very low dose prednisone taper and allopurinol 50 mg QOD with close follow up.   She states her finger improved and she was doing well until this past Saturday night. She was coming out of a restaurant with her son, had slightly pain right foot, stepped off the curb towards the car and had acute heel/foot pain with swelling.  She was unable to sleep last night due to pain, she is able to walk on it but is walking with walker and thought about  wearing a boot she has at home. Pain is worse bottom of her heel and at her achilles tendon. She has redness, warmth, swelling of her achilles tendon/heel.   She has been elevating it, took colchicine Sunday 2 and then 1 Monday and Tuesday- none today.  Lab Results  Component Value Date   LABURIC 11.9 (H) 08/03/2019     Lab Results  Component Value Date   CREATININE 1.99 (H) 09/04/2019   BUN 47 (H) 09/04/2019   NA 137 09/04/2019   K 4.5 09/04/2019   CL 107 09/04/2019   CO2 19 (L) 09/04/2019   Lab Results  Component Value Date   GFRAA 26 (L) 09/04/2019   Lab Results  Component Value Date   TSH 2.20 07/12/2019     Past Medical History:  Diagnosis Date  . Anemia, unspecified   . Arthritis    "knees; right shoulder" (09/15/2013)  . Basal cell carcinoma    "right upper outer lip"  . Chronic kidney disease   . DM neuropathy, type II diabetes mellitus ([place])   . Dyspnea 2021   with low iron  . Epilepsy ([place])   . GERD (gastroesophageal reflux disease)   . Gout   . High cholesterol   . Hypertension   . Hypothyroidism   . Meniere's disease   . Obesity (BMI 30-39.9)   .  Other forms of epilepsy and recurrent seizures without mention of intractable epilepsy 11/04/2012   Non convulsive paroxysmal spells, responding to [facility], patient not driving.   . Pneumonia ~ 1943  . Skin cancer    "forehead; right hand"  . Type II or unspecified type diabetes mellitus without mention of complication, not stated as uncontrolled   . UTI (urinary tract infection)   . Vitamin D deficiency      Allergies  Allergen Reactions  . Ppd [Tuberculin Purified Protein Derivative] Other (See Comments)    indurated       Current Outpatient Medications (Endocrine & Metabolic):  .  insulin NPH-regular Human (NOVOLIN 70/30) (70-30) 100 UNIT/ML injection, Takes 28 units qam and 24-26 units qpm .  levothyroxine (SYNTHROID) 100 MCG tablet, Take 1 tablet daily on an empty stomach with only water for 30  minutes & no Antacid meds, Calcium or Magnesium for 4 hours & avoid Biotin  Current Outpatient Medications (Cardiovascular):  .  atenolol (TENORMIN) 50 MG tablet, Take 1 tablet (50 mg total) by mouth daily. Take 1 tablet Daily for BP .  furosemide (LASIX) 40 MG tablet, Take one tablet daily or as directed.  Current Outpatient Medications (Respiratory):  .  loratadine (CLARITIN) 10 MG tablet, Take 10 mg by mouth daily.  Current Outpatient Medications (Analgesics):  .  acetaminophen (TYLENOL) 500 MG tablet, Take 1,000 mg by mouth every 6 (six) hours as needed for moderate pain. Marland Kitchen  allopurinol (ZYLOPRIM) 100 MG tablet, Take 0.5 tablets (50 mg total) by mouth every other day. Marland Kitchen  aspirin 81 MG tablet, Take 81 mg by mouth daily.  Current Outpatient Medications (Hematological):  .  vitamin B-12 (CYANOCOBALAMIN) 100 MCG tablet, Take 500 mcg by mouth daily.   Current Outpatient Medications (Other):  .  cholecalciferol (VITAMIN D3) 25 MCG (1000 UNIT) tablet, Take 1,000 Units by mouth daily. Per Nephrologist, start taking 1000 units, but patient taking 5000 units presently. .  fish oil-omega-3 fatty acids 1000 MG capsule, Take 2 g by mouth at bedtime.  .  Flaxseed, Linseed, (FLAX SEED OIL) 1000 MG CAPS, Take 1 capsule by mouth 3 (three) times daily. Marland Kitchen  glucose blood (ONETOUCH VERIO) test strip, Check blood sugar 3 times daily-[id number] .  Lancets (ONETOUCH ULTRASOFT) lancets, Insulin dependent check sugars 2-3 x a day- 3 month supply .  lansoprazole (PREVACID) 30 MG capsule, Take 1 capsule Daily to Prevent Heart burn & Indigestion .  levETIRAcetam (KEPPRA) 500 MG tablet, Take 1 tablet 3 x /day after Meals to Prevent Seizures .  meclizine (ANTIVERT) 25 MG tablet, TAKE ONE TABLET BY MOUTH UP TO THREE TIMES PER DAY IF NEEDED FOR DIZZINESS/VERTIGO .  rOPINIRole (REQUIP) 1 MG tablet, Take 1/2 to 1 tablet 3 x /day as needed for Restless Legs & Cramps .  vitamin C (ASCORBIC ACID) 500 MG tablet, Take 1,000  mg by mouth 3 (three) times daily.   ROS: all negative except above.   Physical Exam: Filed Weights   09/29/19 0934  Weight: 190 lb (86.2 kg)   BP 122/68   Pulse 74   Temp (!) 97.3 F (36.3 C)   Wt 190 lb (86.2 kg)   SpO2 96%   BMI 34.75 kg/m  General Appearance: Well nourished, in no apparent distress. Eyes: PERRLA, EOMs, conjunctiva no swelling or erythema Sinuses: No Frontal/maxillary tenderness ENT/Mouth: Ext aud canals clear, TMs without erythema, bulging. No erythema, swelling, or exudate on post pharynx.  Tonsils not swollen or erythematous. Hearing normal.  Neck: Supple, thyroid normal.  Respiratory: Respiratory effort normal, BS equal bilaterally without rales, rhonchi, wheezing or stridor.  Cardio: RRR with no RGs,  3/6 holosystolic murmur at L 2nd ICD with radiation to neck. Symmetrical peripheral pulses without notable edema.  Abdomen: Soft, + BS.  Non tender, no guarding, rebound, hernias, masses. Lymphatics: Non tender without lymphadenopathy.  Musculoskeletal: Full ROM, Symmetrical strength, Slow gait with cane. She has bony enlargement of bilateral hand PIP/DIP joints, without warmth/swelling, her left posterior heel with erythema, swelling, warmth, full flexion and extension, no streaking, good distal sensation and pulses.  Skin: Warm, dry without rashes, ecchymosis. .  Neuro: Cranial nerves intact. No cerebellar symptoms.  Psych: Awake and oriented X 3, normal affect, Insight and Judgment appropriate.      Vicie Mutters, PA-C 10:12 AM [facility] ([facility]

## 2019-09-29 ENCOUNTER — Other Ambulatory Visit: Payer: Self-pay

## 2019-09-29 ENCOUNTER — Ambulatory Visit: Payer: Medicare Other | Admitting: Physician Assistant

## 2019-09-29 ENCOUNTER — Encounter: Payer: Self-pay | Admitting: Physician Assistant

## 2019-09-29 DIAGNOSIS — E1122 Type 2 diabetes mellitus with diabetic chronic kidney disease: Secondary | ICD-10-CM | POA: Diagnosis not present

## 2019-09-29 DIAGNOSIS — N3000 Acute cystitis without hematuria: Secondary | ICD-10-CM

## 2019-09-29 DIAGNOSIS — M10349 Gout due to renal impairment, unspecified hand: Secondary | ICD-10-CM | POA: Diagnosis not present

## 2019-09-29 DIAGNOSIS — N184 Chronic kidney disease, stage 4 (severe): Secondary | ICD-10-CM

## 2019-09-29 DIAGNOSIS — M79671 Pain in right foot: Secondary | ICD-10-CM

## 2019-09-29 NOTE — Patient Instructions (Signed)
INFORMATION ABOUT YOUR XRAY [facility] Can walk into [address] building for an Insurance account manager. They will have the order and take you back. You do not any paper work, I should get the result back today or tomorrow. This order is good for a year.  Can call [phone] to schedule an appointment if you wish.   The patient was advised to call immediately if she has any concerning symptoms in the interval. The patient voices understanding of current treatment options and is in agreement with the current care plan.The patient knows to call the clinic with any problems, questions or concerns or go to the ER if any further progression of symptoms.   Gout  Gout is a condition that causes painful swelling of the joints. Gout is a type of inflammation of the joints (arthritis). This condition is caused by having too much uric acid in the body. Uric acid is a chemical that forms when the body breaks down substances called purines. Purines are important for building body proteins. When the body has too much uric acid, sharp crystals can form and build up inside the joints. This causes pain and swelling. Gout attacks can happen quickly and may be very painful (acute gout). Over time, the attacks can affect more joints and become more frequent (chronic gout). Gout can also cause uric acid to build up under the skin and inside the kidneys. What are the causes? This condition is caused by too much uric acid in your blood. This can happen because:  Your kidneys do not remove enough uric acid from your blood. This is the most common cause.  Your body makes too much uric acid. This can happen with some cancers and cancer treatments. It can also occur if your body is breaking down too many red blood cells (hemolytic anemia).  You eat too many foods that are high in purines. These foods include organ meats and some seafood. Alcohol, especially beer, is also high in purines. A gout attack may be triggered by trauma  or stress. What increases the risk? You are more likely to develop this condition if you:  Have a family history of gout.  Are female and middle-aged.  Are female and have gone through menopause.  Are obese.  Frequently drink alcohol, especially beer.  Are dehydrated.  Lose weight too quickly.  Have an organ transplant.  Have lead poisoning.  Take certain medicines, including aspirin, cyclosporine, diuretics, levodopa, and niacin.  Have kidney disease.  Have a skin condition called psoriasis. What are the signs or symptoms? An attack of acute gout happens quickly. It usually occurs in just one joint. The most common place is the big toe. Attacks often start at night. Other joints that may be affected include joints of the feet, ankle, knee, fingers, wrist, or elbow. Symptoms of this condition may include:  Severe pain.  Warmth.  Swelling.  Stiffness.  Tenderness. The affected joint may be very painful to touch.  Shiny, red, or purple skin.  Chills and fever. Chronic gout may cause symptoms more frequently. More joints may be involved. You may also have white or yellow lumps (tophi) on your hands or feet or in other areas near your joints. How is this diagnosed? This condition is diagnosed based on your symptoms, medical history, and physical exam. You may have tests, such as:  Blood tests to measure uric acid levels.  Removal of joint fluid with a thin needle (aspiration) to look for uric acid crystals.  X-rays to look for joint damage. How is this treated? Treatment for this condition has two phases: treating an acute attack and preventing future attacks. Acute gout treatment may include medicines to reduce pain and swelling, including:  NSAIDs.  Steroids. These are strong anti-inflammatory medicines that can be taken by mouth (orally) or injected into a joint.  Colchicine. This medicine relieves pain and swelling when it is taken soon after an attack. It can  be given by mouth or through an IV. Preventive treatment may include:  Daily use of smaller doses of NSAIDs or colchicine.  Use of a medicine that reduces uric acid levels in your blood.  Changes to your diet. You may need to see a dietitian about what to eat and drink to prevent gout. Follow these instructions at home: During a gout attack   If directed, put ice on the affected area: ? Put ice in a plastic bag. ? Place a towel between your skin and the bag. ? Leave the ice on for 20 minutes, 2-3 times a day.  Raise (elevate) the affected joint above the level of your heart as often as possible.  Rest the joint as much as possible. If the affected joint is in your leg, you may be given crutches to use.  Follow instructions from your health care provider about eating or drinking restrictions. Avoiding future gout attacks  Follow a low-purine diet as told by your dietitian or health care provider. Avoid foods and drinks that are high in purines, including liver, kidney, anchovies, asparagus, herring, mushrooms, mussels, and beer.  Maintain a healthy weight or lose weight if you are overweight. If you want to lose weight, talk with your health care provider. It is important that you do not lose weight too quickly.  Start or maintain an exercise program as told by your health care provider. Eating and drinking  Drink enough fluids to keep your urine pale yellow.  If you drink alcohol: ? Limit how much you use to:  0-1 drink a day for women.  0-2 drinks a day for men. ? Be aware of how much alcohol is in your drink. In the [place] one drink equals one 12 oz bottle of beer (355 mL) one 5 oz glass of wine (148 mL), or one 1 oz glass of hard liquor (44 mL). General instructions  Take over-the-counter and prescription medicines only as told by your health care provider.  Do not drive or use heavy machinery while taking prescription pain medicine.  Return to your normal activities  as told by your health care provider. Ask your health care provider what activities are safe for you.  Keep all follow-up visits as told by your health care provider. This is important. Contact a health care provider if you have:  Another gout attack.  Continuing symptoms of a gout attack after 10 days of treatment.  Side effects from your medicines.  Chills or a fever.  Burning pain when you urinate.  Pain in your lower back or belly. Get help right away if you:  Have severe or uncontrolled pain.  Cannot urinate. Summary  Gout is painful swelling of the joints caused by inflammation.  The most common site of pain is the big toe, but it can affect other joints in the body.  Medicines and dietary changes can help to prevent and treat gout attacks. This information is not intended to replace advice given to you by your health care provider. Make sure you discuss any  questions you have with your health care provider. Document Revised: 08/27/2017 Document Reviewed: 08/27/2017 Elsevier Patient Education  [place].

## 2019-09-30 LAB — CBC WITH DIFFERENTIAL/PLATELET
Absolute Monocytes: 515 cells/uL (ref 200–950)
Basophils Absolute: 33 cells/uL (ref 0–200)
Basophils Relative: 0.5 %
Eosinophils Absolute: 634 cells/uL — ABNORMAL HIGH (ref 15–500)
Eosinophils Relative: 9.6 %
HCT: 37 % (ref 35.0–45.0)
Hemoglobin: 11 g/dL — ABNORMAL LOW (ref 11.7–15.5)
Lymphs Abs: 587 cells/uL — ABNORMAL LOW (ref 850–3900)
MCH: 26 pg — ABNORMAL LOW (ref 27.0–33.0)
MCHC: 29.7 g/dL — ABNORMAL LOW (ref 32.0–36.0)
MCV: 87.5 fL (ref 80.0–100.0)
MPV: 10.5 fL (ref 7.5–12.5)
Monocytes Relative: 7.8 %
Neutro Abs: 4831 cells/uL (ref 1500–7800)
Neutrophils Relative %: 73.2 %
Platelets: 428 10*3/uL — ABNORMAL HIGH (ref 140–400)
RBC: 4.23 10*6/uL (ref 3.80–5.10)
RDW: 19.8 % — ABNORMAL HIGH (ref 11.0–15.0)
Total Lymphocyte: 8.9 %
WBC: 6.6 10*3/uL (ref 3.8–10.8)

## 2019-09-30 LAB — URINE CULTURE
MICRO NUMBER:: 10814813
SPECIMEN QUALITY:: ADEQUATE

## 2019-09-30 LAB — COMPLETE METABOLIC PANEL WITH GFR
AG Ratio: 1.6 (calc) (ref 1.0–2.5)
ALT: 14 U/L (ref 6–29)
AST: 14 U/L (ref 10–35)
Albumin: 4.1 g/dL (ref 3.6–5.1)
Alkaline phosphatase (APISO): 80 U/L (ref 37–153)
BUN/Creatinine Ratio: 26 (calc) — ABNORMAL HIGH (ref 6–22)
BUN: 46 mg/dL — ABNORMAL HIGH (ref 7–25)
CO2: 22 mmol/L (ref 20–32)
Calcium: 10 mg/dL (ref 8.6–10.4)
Chloride: 109 mmol/L (ref 98–110)
Creat: 1.78 mg/dL — ABNORMAL HIGH (ref 0.60–0.88)
GFR, Est African American: 30 mL/min/{1.73_m2} — ABNORMAL LOW (ref >=60)
GFR, Est Non African American: 26 mL/min/{1.73_m2} — ABNORMAL LOW (ref >=60)
Globulin: 2.5 g/dL (calc) (ref 1.9–3.7)
Glucose, Bld: 157 mg/dL — ABNORMAL HIGH (ref 65–99)
Potassium: 4.8 mmol/L (ref 3.5–5.3)
Sodium: 141 mmol/L (ref 135–146)
Total Bilirubin: 0.4 mg/dL (ref 0.2–1.2)
Total Protein: 6.6 g/dL (ref 6.1–8.1)

## 2019-09-30 LAB — URINALYSIS, ROUTINE W REFLEX MICROSCOPIC
Bacteria, UA: NONE SEEN /HPF
Bilirubin Urine: NEGATIVE
Glucose, UA: NEGATIVE
Hgb urine dipstick: NEGATIVE
Ketones, ur: NEGATIVE
Nitrite: NEGATIVE
Protein, ur: NEGATIVE
Specific Gravity, Urine: 1.013 (ref 1.001–1.03)
pH: <=5 (ref 5.0–8.0)

## 2019-09-30 LAB — C-REACTIVE PROTEIN: CRP: 14.7 mg/L — ABNORMAL HIGH (ref <8.0)

## 2019-09-30 LAB — SEDIMENTATION RATE: Sed Rate: 9 mm/h (ref 0–30)

## 2019-09-30 LAB — URIC ACID: Uric Acid, Serum: 10.1 mg/dL — ABNORMAL HIGH (ref 2.5–7.0)

## 2019-10-01 MED ORDER — PREDNISONE 5 MG PO TABS: ORAL_TABLET | ORAL | 0 refills | Status: DC

## 2019-10-01 NOTE — Addendum Note (Signed)
Addended by: Vicie Mutters R on: 10/01/2019 08:07 AM   Modules accepted: Orders

## 2019-10-05 ENCOUNTER — Ambulatory Visit: Payer: Medicare Other | Admitting: Physician Assistant

## 2019-10-05 ENCOUNTER — Ambulatory Visit: Payer: Self-pay

## 2019-10-05 ENCOUNTER — Encounter: Payer: Self-pay | Admitting: Physician Assistant

## 2019-10-05 VITALS — Ht 62.0 in | Wt 190.0 lb

## 2019-10-05 DIAGNOSIS — M79672 Pain in left foot: Secondary | ICD-10-CM

## 2019-10-05 DIAGNOSIS — M79671 Pain in right foot: Secondary | ICD-10-CM | POA: Diagnosis not present

## 2019-10-05 NOTE — Progress Notes (Signed)
Office Visit Note   Patient: [name] [name]           Lines of my car date of Birth: 12-02-1932           MRN: [id number] Visit Date: 10/05/2019              Requested by: Vicie Mutters, PA-C [address] [place],  [place] [address] PCP: Unk Pinto, MD  Chief Complaint  Patient presents with  . Right Foot - Pain, New Patient (Initial Visit)      HPI: This is a pleasant 86 year old woman with a chief complaint of right greater than left heel pain.  She is also concerned about pain she has been having in her fingers.  She does have a history of gout.  She presented to her primary care physician and her uric acid 6 days ago was 10.1.  She has been started on a low dose of allopurinol because of kidney issues.  She cannot take colchicine.  Her primary care provider is going to increase her allopurinol if it is tolerated.  She has had 3 to 4 weeks of  heel pain.  She first noticed this when she had been out to dinner and stepped down from a curb.  Since then she has had increased swelling which decreases with elevation.  She is ambulating in an unsupportive shoe.  She is a diabetic and admits that her diabetes is not very well controlled  Assessment & Plan: Visit Diagnoses:  1. Pain in both feet     Plan: Calcaneus fracture.  This is in light of her neuropathy probably 36 to 45 weeks old.  Patient was provided a prescription to get VIVE compression stockings.  I would like her to try and use a short cam boot though this may be difficult for her.  She will follow-up in 1 week with Dr. Sharol Given.  She will work with her primary care provider to adjust her gout medication based on her other medical issues  Follow-Up Instructions: No follow-ups on file.   Ortho Exam  Patient is alert, oriented, no adenopathy, well-dressed, normal affect, normal respiratory effort. Right heel: Moderate soft tissue swelling in her heel and her foot.  She has strong palpable pulses.  There  is no cellulitis no skin breakdown or ulcers. X-rays of her feet were reviewed today.  On the right side she does have a fracture of the posterior facet of the calcaneus with some superior displacement intra-articular. Imaging: No results found. No images are attached to the encounter.  Labs: Lab Results  Component Value Date   HGBA1C 4.9 07/12/2019   HGBA1C 5.6 04/05/2019   HGBA1C 6.5 (H) 01/01/2019   ESRSEDRATE 9 09/29/2019   ESRSEDRATE 0 05/03/2019   ESRSEDRATE 1 05/02/2014   CRP 14.7 (H) 09/29/2019   LABURIC 10.1 (H) 09/29/2019   LABURIC 11.9 (H) 08/03/2019   LABURIC 7.3 (H) 05/02/2014   REPTSTATUS 09/07/2019 FINAL 09/04/2019   CULT >=100,000 COLONIES/mL ESCHERICHIA COLI (A) 09/04/2019   LABORGA ESCHERICHIA COLI (A) 09/04/2019     Lab Results  Component Value Date   ALBUMIN 3.6 09/04/2019   ALBUMIN 3.9 08/25/2019   ALBUMIN 4.0 06/30/2019   LABURIC 10.1 (H) 09/29/2019   LABURIC 11.9 (H) 08/03/2019   LABURIC 7.3 (H) 05/02/2014    Lab Results  Component Value Date   MG 2.3 07/12/2019   MG 2.4 04/05/2019   MG 2.3 01/01/2019   Lab Results  Component Value Date  VD25OH 39 04/05/2019   VD25OH 80 09/21/2018   VD25OH 88 03/16/2018    No results found for: PREALBUMIN CBC EXTENDED Latest Ref Rng & Units 09/29/2019 09/04/2019 08/25/2019  WBC 3.8 - 10.8 Thousand/uL 6.6 8.6 5.8  RBC 3.80 - 5.10 Million/uL 4.23 4.24 4.25  HGB 11.7 - 15.5 g/dL 11.0(L) 11.0(L) 11.2(L)  HCT 35 - 45 % 37.0 37.1 37.7  PLT 140 - 400 Thousand/uL 428(H) 354 399  NEUTROABS 1,500 - 7,800 cells/uL 4,831 - 4.0  LYMPHSABS 850 - 3,900 cells/uL 587(L) - 0.7     Body mass index is 34.75 kg/m.  Orders:  Orders Placed This Encounter  Procedures  . XR Foot 2 Views Left  . XR Foot 2 Views Right   No orders of the defined types were placed in this encounter.    Procedures: No procedures performed  Clinical Data: No additional findings.  ROS:  All other systems negative, except as noted in  the HPI. Review of Systems  Objective: Vital Signs: Ht 5\' 2"  (1.575 m)   Wt 190 lb (86.2 kg)   BMI 34.75 kg/m   Specialty Comments:  No specialty comments available.  PMFS History: Patient Active Problem List   Diagnosis Date Noted  . Iron deficiency anemia 05/10/2019  . Secondary hyperparathyroidism of renal origin ([place]) 09/20/2018  . Type 2 diabetes mellitus with stage 4 chronic kidney disease, with long-term current use of insulin ([place]) 12/08/2017  . ACE inhibitor intolerance 12/08/2017  . Vertigo of central origin 11/20/2015  . Morbid obesity ([place]) BMI 35+ with T2DM, htn, hyperlipidemia, CKD, OSA 12/12/2014  . OSA on CPAP 08/08/2014  . Type II diabetes mellitus with neurological manifestations ([place]) 09/15/2013  . Hyperlipidemia associated with type 2 diabetes mellitus ([place]) 06/27/2013  . Vitamin D deficiency 06/27/2013  . Seizure disorder ([place]) 11/04/2012  . Anemia 06/13/2008  . COLONIC POLYPS 06/09/2008  . Hypothyroidism 06/09/2008  . CKD stage 4 due to type 2 diabetes mellitus ([place]) 06/09/2008  . Venous (peripheral) insufficiency 06/09/2008  . Gastroesophageal reflux disease 06/09/2008  . Essential hypertension 06/09/2008   Past Medical History:  Diagnosis Date  . Anemia, unspecified   . Arthritis    "knees; right shoulder" (09/15/2013)  . Basal cell carcinoma    "right upper outer lip"  . Chronic kidney disease   . DM neuropathy, type II diabetes mellitus ([place])   . Dyspnea 2021   with low iron  . Epilepsy ([place])   . GERD (gastroesophageal reflux disease)   . Gout   . High cholesterol   . Hypertension   . Hypothyroidism   . Meniere's disease   . Obesity (BMI 30-39.9)   . Other forms of epilepsy and recurrent seizures without mention of intractable epilepsy 11/04/2012   Non convulsive paroxysmal spells, responding to [facility], patient not driving.   . Pneumonia ~ 1943  . Skin cancer    "forehead; right hand"  . Type II or unspecified type diabetes mellitus  without mention of complication, not stated as uncontrolled   . UTI (urinary tract infection)   . Vitamin D deficiency     Family History  Problem Relation Age of Onset  . Heart disease Mother   . Stroke Mother   . Heart disease Father   . Ovarian cancer Sister   . Hypertension Son   . Hypertension Son   . Diabetes Son   . Alcohol abuse Son     Past Surgical History:  Procedure Laterality Date  . CERVICAL  POLYPECTOMY    . HAMMER TOE SURGERY Left 1980's  . MOHS SURGERY  20087   "right upper outer lip"  . TONSILLECTOMY  ~ 1947  . WRIST SURGERY Left ~ 1950   "ran arm thru window"   Social History   Occupational History  . Occupation: retired  Tobacco Use  . Smoking status: Former Smoker    Packs/day: 1.00    Years: 30.00    Pack years: 30.00    Types: Cigarettes    Quit date: 06/29/1983    Years since quitting: 36.2  . Smokeless tobacco: Never Used  Vaping Use  . Vaping Use: Never used  Substance and Sexual Activity  . Alcohol use: Yes    Alcohol/week: 0.0 standard drinks    Comment: 09/15/2013 "glass of wine maybe once/year"  . Drug use: No  . Sexual activity: Not Currently

## 2019-10-07 ENCOUNTER — Ambulatory Visit: Payer: Self-pay

## 2019-10-07 ENCOUNTER — Encounter: Payer: Self-pay | Admitting: Orthopedic Surgery

## 2019-10-07 ENCOUNTER — Ambulatory Visit: Payer: Medicare Other | Admitting: Orthopedic Surgery

## 2019-10-07 DIAGNOSIS — M79672 Pain in left foot: Secondary | ICD-10-CM | POA: Diagnosis not present

## 2019-10-07 DIAGNOSIS — S92011A Displaced fracture of body of right calcaneus, initial encounter for closed fracture: Secondary | ICD-10-CM | POA: Diagnosis not present

## 2019-10-07 DIAGNOSIS — M79671 Pain in right foot: Secondary | ICD-10-CM | POA: Diagnosis not present

## 2019-10-07 NOTE — Progress Notes (Signed)
Office Visit Note   Patient: [name] [name]           Date of Birth: [birth year]           MRN: [id number] Visit Date: 10/07/2019              Requested by: Unk Pinto, [place] [address] [place],  [place] [address] PCP: Unk Pinto, MD  Chief Complaint  Patient presents with  . f/u heel fx      HPI: Patient is a 86 year old woman who was seen in follow-up for a tongue type calcaneus fracture.  Patient states she stepped off a curb 2 weeks ago and sustained the injury.  Initial radiographs showed 7 mm of displacement patient was placed in the fracture boot and she was seen today in follow-up to see if he has any displacement or skin changes.  Assessment & Plan: Visit Diagnoses:  1. Pain in both feet   2. Closed displaced fracture of body of right calcaneus, initial encounter     Plan: There are no skin changes no blisters no ulcers no ischemic changes.  Patient wishes to pursue conservative treatment we will place two 9/16 since heel lifts in her fracture boot continue with elevation protected weightbearing reevaluate next week.  Follow-Up Instructions: No follow-ups on file.   Ortho Exam  Patient is alert, oriented, no adenopathy, well-dressed, normal affect, normal respiratory effort. Examination patient has a good dorsalis pedis pulse.  The skin over the Achilles insertion is intact there is no blistering no cellulitis no blanching of the skin.  She has a strong dorsalis pedis pulse.  Radiographs show initial displacement of 7 mm now at 9 mm with minimal change.  We will place in 916 inch heel lift x2 and follow-up next week.  Imaging: XR Os Calcis Right  Result Date: 10/07/2019 2 view radiographs of the right calcaneus shows a tongue type fracture.  There is 9 mm of displacement previous radiographs showed 7 mm of displacement.  No images are attached to the encounter.  Labs: Lab Results  Component Value Date   HGBA1C 4.9 07/12/2019    HGBA1C 5.6 04/05/2019   HGBA1C 6.5 (H) 01/01/2019   ESRSEDRATE 9 09/29/2019   ESRSEDRATE 0 05/03/2019   ESRSEDRATE 1 05/02/2014   CRP 14.7 (H) 09/29/2019   LABURIC 10.1 (H) 09/29/2019   LABURIC 11.9 (H) 08/03/2019   LABURIC 7.3 (H) 05/02/2014   REPTSTATUS 09/07/2019 FINAL 09/04/2019   CULT >=100,000 COLONIES/mL ESCHERICHIA COLI (A) 09/04/2019   LABORGA ESCHERICHIA COLI (A) 09/04/2019     Lab Results  Component Value Date   ALBUMIN 3.6 09/04/2019   ALBUMIN 3.9 08/25/2019   ALBUMIN 4.0 06/30/2019   LABURIC 10.1 (H) 09/29/2019   LABURIC 11.9 (H) 08/03/2019   LABURIC 7.3 (H) 05/02/2014    Lab Results  Component Value Date   MG 2.3 07/12/2019   MG 2.4 04/05/2019   MG 2.3 01/01/2019   Lab Results  Component Value Date   VD25OH 39 04/05/2019   VD25OH 80 09/21/2018   VD25OH 88 03/16/2018    No results found for: PREALBUMIN CBC EXTENDED Latest Ref Rng & Units 09/29/2019 09/04/2019 08/25/2019  WBC 3.8 - 10.8 Thousand/uL 6.6 8.6 5.8  RBC 3.80 - 5.10 Million/uL 4.23 4.24 4.25  HGB 11.7 - 15.5 g/dL 11.0(L) 11.0(L) 11.2(L)  HCT 35 - 45 % 37.0 37.1 37.7  PLT 140 - 400 Thousand/uL 428(H) 354 399  NEUTROABS 1,500 - 7,800 cells/uL 4,831 -  4.0  LYMPHSABS 850 - 3,900 cells/uL 587(L) - 0.7     There is no height or weight on file to calculate BMI.  Orders:  Orders Placed This Encounter  Procedures  . XR Os Calcis Right   No orders of the defined types were placed in this encounter.    Procedures: No procedures performed  Clinical Data: No additional findings.  ROS:  All other systems negative, except as noted in the HPI. Review of Systems  Objective: Vital Signs: There were no vitals taken for this visit.  Specialty Comments:  No specialty comments available.  PMFS History: Patient Active Problem List   Diagnosis Date Noted  . Iron deficiency anemia 05/10/2019  . Secondary hyperparathyroidism of renal origin ([place]) 09/20/2018  . Type 2 diabetes mellitus with  stage 4 chronic kidney disease, with long-term current use of insulin ([place]) 12/08/2017  . ACE inhibitor intolerance 12/08/2017  . Vertigo of central origin 11/20/2015  . Morbid obesity ([place]) BMI 35+ with T2DM, htn, hyperlipidemia, CKD, OSA 12/12/2014  . OSA on CPAP 08/08/2014  . Type II diabetes mellitus with neurological manifestations ([place]) 09/15/2013  . Hyperlipidemia associated with type 2 diabetes mellitus ([place]) 06/27/2013  . Vitamin D deficiency 06/27/2013  . Seizure disorder ([place]) 11/04/2012  . Anemia 06/13/2008  . COLONIC POLYPS 06/09/2008  . Hypothyroidism 06/09/2008  . CKD stage 4 due to type 2 diabetes mellitus ([place]) 06/09/2008  . Venous (peripheral) insufficiency 06/09/2008  . Gastroesophageal reflux disease 06/09/2008  . Essential hypertension 06/09/2008   Past Medical History:  Diagnosis Date  . Anemia, unspecified   . Arthritis    "knees; right shoulder" (09/15/2013)  . Basal cell carcinoma    "right upper outer lip"  . Chronic kidney disease   . DM neuropathy, type II diabetes mellitus ([place])   . Dyspnea 2021   with low iron  . Epilepsy ([place])   . GERD (gastroesophageal reflux disease)   . Gout   . High cholesterol   . Hypertension   . Hypothyroidism   . Meniere's disease   . Obesity (BMI 30-39.9)   . Other forms of epilepsy and recurrent seizures without mention of intractable epilepsy 11/04/2012   Non convulsive paroxysmal spells, responding to [facility], patient not driving.   . Pneumonia ~ 1943  . Skin cancer    "forehead; right hand"  . Type II or unspecified type diabetes mellitus without mention of complication, not stated as uncontrolled   . UTI (urinary tract infection)   . Vitamin D deficiency     Family History  Problem Relation Age of Onset  . Heart disease Mother   . Stroke Mother   . Heart disease Father   . Ovarian cancer Sister   . Hypertension Son   . Hypertension Son   . Diabetes Son   . Alcohol abuse Son     Past Surgical History:    Procedure Laterality Date  . CERVICAL POLYPECTOMY    . HAMMER TOE SURGERY Left 1980's  . MOHS SURGERY  20087   "right upper outer lip"  . TONSILLECTOMY  ~ 1947  . WRIST SURGERY Left ~ 1950   "ran arm thru window"   Social History   Occupational History  . Occupation: retired  Tobacco Use  . Smoking status: Former Smoker    Packs/day: 1.00    Years: 30.00    Pack years: 30.00    Types: Cigarettes    Quit date: 06/29/1983    Years since quitting: 36.2  .  Smokeless tobacco: Never Used  Vaping Use  . Vaping Use: Never used  Substance and Sexual Activity  . Alcohol use: Yes    Alcohol/week: 0.0 standard drinks    Comment: 09/15/2013 "glass of wine maybe once/year"  . Drug use: No  . Sexual activity: Not Currently

## 2019-10-11 ENCOUNTER — Other Ambulatory Visit: Payer: Self-pay | Admitting: Adult Health

## 2019-10-11 DIAGNOSIS — L97511 Non-pressure chronic ulcer of other part of right foot limited to breakdown of skin: Secondary | ICD-10-CM

## 2019-10-12 ENCOUNTER — Ambulatory Visit: Payer: Medicare Other | Admitting: Physician Assistant

## 2019-10-12 ENCOUNTER — Ambulatory Visit: Payer: Self-pay

## 2019-10-12 ENCOUNTER — Encounter: Payer: Self-pay | Admitting: Physician Assistant

## 2019-10-12 DIAGNOSIS — S92011A Displaced fracture of body of right calcaneus, initial encounter for closed fracture: Secondary | ICD-10-CM | POA: Diagnosis not present

## 2019-10-12 NOTE — Progress Notes (Signed)
Office Visit Note   Patient: [name] [name]           Date of Birth: 03-30-1932           MRN: [id number] Visit Date: 10/12/2019              Requested by: Unk Pinto, [address] [place],  [place] [address] PCP: Unk Pinto, MD  No chief complaint on file.     HPI: Patient is in follow-up today for her posterior facet calcaneus fracture on her right.  She has been using a cam walker boot but she says it does not fit properly.  Assessment & Plan: Visit Diagnoses:  1. Closed displaced fracture of body of right calcaneus, initial encounter     Plan: A new boot was placed on her with appropriate heel lifts.  She will continue to use this.  She will follow-up in 1 week with new x-rays of her calcaneus  Follow-Up Instructions: No follow-ups on file.   Ortho Exam  Patient is alert, oriented, no adenopathy, well-dressed, normal affect, normal respiratory effort. Focused examination demonstrates mild to moderate soft tissue swelling in her foot.  She has absolutely no skin breakdown in her posterior heel nor is she tender to palpation.  No cellulitis no evidence of infection or skin compromise  Imaging: No results found. No images are attached to the encounter.  Labs: Lab Results  Component Value Date   HGBA1C 4.9 07/12/2019   HGBA1C 5.6 04/05/2019   HGBA1C 6.5 (H) 01/01/2019   ESRSEDRATE 9 09/29/2019   ESRSEDRATE 0 05/03/2019   ESRSEDRATE 1 05/02/2014   CRP 14.7 (H) 09/29/2019   LABURIC 10.1 (H) 09/29/2019   LABURIC 11.9 (H) 08/03/2019   LABURIC 7.3 (H) 05/02/2014   REPTSTATUS 09/07/2019 FINAL 09/04/2019   CULT >=100,000 COLONIES/mL ESCHERICHIA COLI (A) 09/04/2019   LABORGA ESCHERICHIA COLI (A) 09/04/2019     Lab Results  Component Value Date   ALBUMIN 3.6 09/04/2019   ALBUMIN 3.9 08/25/2019   ALBUMIN 4.0 06/30/2019   LABURIC 10.1 (H) 09/29/2019   LABURIC 11.9 (H) 08/03/2019   LABURIC 7.3 (H) 05/02/2014    Lab Results    Component Value Date   MG 2.3 07/12/2019   MG 2.4 04/05/2019   MG 2.3 01/01/2019   Lab Results  Component Value Date   VD25OH 39 04/05/2019   VD25OH 80 09/21/2018   VD25OH 88 03/16/2018    No results found for: PREALBUMIN CBC EXTENDED Latest Ref Rng & Units 09/29/2019 09/04/2019 08/25/2019  WBC 3.8 - 10.8 Thousand/uL 6.6 8.6 5.8  RBC 3.80 - 5.10 Million/uL 4.23 4.24 4.25  HGB 11.7 - 15.5 g/dL 11.0(L) 11.0(L) 11.2(L)  HCT 35 - 45 % 37.0 37.1 37.7  PLT 140 - 400 Thousand/uL 428(H) 354 399  NEUTROABS 1,500 - 7,800 cells/uL 4,831 - 4.0  LYMPHSABS 850 - 3,900 cells/uL 587(L) - 0.7     There is no height or weight on file to calculate BMI.  Orders:  Orders Placed This Encounter  Procedures  . XR Os Calcis Right   No orders of the defined types were placed in this encounter.    Procedures: No procedures performed  Clinical Data: No additional findings.  ROS:  All other systems negative, except as noted in the HPI. Review of Systems  Objective: Vital Signs: There were no vitals taken for this visit.  Specialty Comments:  No specialty comments available.  PMFS History: Patient Active Problem List  Diagnosis Date Noted  . Iron deficiency anemia 05/10/2019  . Secondary hyperparathyroidism of renal origin ([place]) 09/20/2018  . Type 2 diabetes mellitus with stage 4 chronic kidney disease, with long-term current use of insulin ([place]) 12/08/2017  . ACE inhibitor intolerance 12/08/2017  . Vertigo of central origin 11/20/2015  . Morbid obesity ([place]) BMI 35+ with T2DM, htn, hyperlipidemia, CKD, OSA 12/12/2014  . OSA on CPAP 08/08/2014  . Type II diabetes mellitus with neurological manifestations ([place]) 09/15/2013  . Hyperlipidemia associated with type 2 diabetes mellitus ([place]) 06/27/2013  . Vitamin D deficiency 06/27/2013  . Seizure disorder ([place]) 11/04/2012  . Anemia 06/13/2008  . COLONIC POLYPS 06/09/2008  . Hypothyroidism 06/09/2008  . CKD stage 4 due to type 2  diabetes mellitus ([place]) 06/09/2008  . Venous (peripheral) insufficiency 06/09/2008  . Gastroesophageal reflux disease 06/09/2008  . Essential hypertension 06/09/2008   Past Medical History:  Diagnosis Date  . Anemia, unspecified   . Arthritis    "knees; right shoulder" (09/15/2013)  . Basal cell carcinoma    "right upper outer lip"  . Chronic kidney disease   . DM neuropathy, type II diabetes mellitus ([place])   . Dyspnea 2021   with low iron  . Epilepsy ([place])   . GERD (gastroesophageal reflux disease)   . Gout   . High cholesterol   . Hypertension   . Hypothyroidism   . Meniere's disease   . Obesity (BMI 30-39.9)   . Other forms of epilepsy and recurrent seizures without mention of intractable epilepsy 11/04/2012   Non convulsive paroxysmal spells, responding to [facility], patient not driving.   . Pneumonia ~ 1943  . Skin cancer    "forehead; right hand"  . Type II or unspecified type diabetes mellitus without mention of complication, not stated as uncontrolled   . UTI (urinary tract infection)   . Vitamin D deficiency     Family History  Problem Relation Age of Onset  . Heart disease Mother   . Stroke Mother   . Heart disease Father   . Ovarian cancer Sister   . Hypertension Son   . Hypertension Son   . Diabetes Son   . Alcohol abuse Son     Past Surgical History:  Procedure Laterality Date  . CERVICAL POLYPECTOMY    . HAMMER TOE SURGERY Left 1980's  . MOHS SURGERY  [address]87   "right upper outer lip"  . TONSILLECTOMY  ~ 1947  . WRIST SURGERY Left ~ 1950   "ran arm thru window"   Social History   Occupational History  . Occupation: retired  Tobacco Use  . Smoking status: Former Smoker    Packs/day: 1.00    Years: 30.00    Pack years: 30.00    Types: Cigarettes    Quit date: 06/29/1983    Years since quitting: 36.3  . Smokeless tobacco: Never Used  Vaping Use  . Vaping Use: Never used  Substance and Sexual Activity  . Alcohol use: Yes    Alcohol/week: 0.0  standard drinks    Comment: 09/15/2013 "glass of wine maybe once/year"  . Drug use: No  . Sexual activity: Not Currently

## 2019-10-19 ENCOUNTER — Ambulatory Visit: Payer: Medicare Other | Admitting: Orthopedic Surgery

## 2019-10-19 ENCOUNTER — Ambulatory Visit: Payer: Self-pay

## 2019-10-19 ENCOUNTER — Encounter: Payer: Self-pay | Admitting: Physician Assistant

## 2019-10-19 DIAGNOSIS — M1A09X1 Idiopathic chronic gout, multiple sites, with tophus (tophi): Secondary | ICD-10-CM | POA: Diagnosis not present

## 2019-10-19 DIAGNOSIS — S92011A Displaced fracture of body of right calcaneus, initial encounter for closed fracture: Secondary | ICD-10-CM

## 2019-10-19 NOTE — Progress Notes (Signed)
Office Visit Note   Patient: [name] [name]           Date of Birth: [birth year]           MRN: [id number] Visit Date: 10/19/2019              Requested by: Unk Pinto, [address] [place],  [place] [address] PCP: Unk Pinto, MD  Chief Complaint  Patient presents with  . Right Foot - Follow-up    Calcaneus      HPI: Patient is an 86 year old woman who is status post tongue type fracture of the right Achilles. Patient denies any pain.  Patient complains of painful gout in her fingers she states she is just started taking allopurinol 50 mg a day she does have renal disease she is not on dialysis  Assessment & Plan: Visit Diagnoses:  1. Closed displaced fracture of body of right calcaneus, initial encounter     Plan: There is essentially no displacement of the fragment we will have her continue with the fracture boot protected weightbearing follow-up in 2 weeks with repeat 2 view radiographs of the calcaneus.  With patient's creatinine clearance of 22, 50 mg of allopurinol is her ideal dose. We will reevaluate her symptoms from gout at follow-up.  Follow-Up Instructions: No follow-ups on file.   Ortho Exam  Patient is alert, oriented, no adenopathy, well-dressed, normal affect, normal respiratory effort. Examination of the skin is intact there is no blisters no ulcers no ischemic changes.  Patient has had a history of gout with some mild tophaceous deposits over the DIP joints of her fingers with degenerative changes of all the DIP joints.  Patient's most recent creatinine clearance is 22. Recommended dose of allopurinol is 50 for a creatinine clearance less than 30.  Her most recent uric acid was 10.1, 2 weeks ago.  Imaging: XR Os Calcis Right  Result Date: 10/19/2019 2 view radiographs of the right calcaneus shows a displacement of the tongue type fracture that was initially 10 mm it is currently 11 mm. The total height of the  calcaneus has changed from 58 mm to 60 mm.  No images are attached to the encounter.  Labs: Lab Results  Component Value Date   HGBA1C 4.9 07/12/2019   HGBA1C 5.6 04/05/2019   HGBA1C 6.5 (H) 01/01/2019   ESRSEDRATE 9 09/29/2019   ESRSEDRATE 0 05/03/2019   ESRSEDRATE 1 05/02/2014   CRP 14.7 (H) 09/29/2019   LABURIC 10.1 (H) 09/29/2019   LABURIC 11.9 (H) 08/03/2019   LABURIC 7.3 (H) 05/02/2014   REPTSTATUS 09/07/2019 FINAL 09/04/2019   CULT >=100,000 COLONIES/mL ESCHERICHIA COLI (A) 09/04/2019   LABORGA ESCHERICHIA COLI (A) 09/04/2019     Lab Results  Component Value Date   ALBUMIN 3.6 09/04/2019   ALBUMIN 3.9 08/25/2019   ALBUMIN 4.0 06/30/2019   LABURIC 10.1 (H) 09/29/2019   LABURIC 11.9 (H) 08/03/2019   LABURIC 7.3 (H) 05/02/2014    Lab Results  Component Value Date   MG 2.3 07/12/2019   MG 2.4 04/05/2019   MG 2.3 01/01/2019   Lab Results  Component Value Date   VD25OH 39 04/05/2019   VD25OH 80 09/21/2018   VD25OH 88 03/16/2018    No results found for: PREALBUMIN CBC EXTENDED Latest Ref Rng & Units 09/29/2019 09/04/2019 08/25/2019  WBC 3.8 - 10.8 Thousand/uL 6.6 8.6 5.8  RBC 3.80 - 5.10 Million/uL 4.23 4.24 4.25  HGB 11.7 - 15.5 g/dL 11.0(L) 11.0(L) 11.2(L)  HCT 35 - 45 % 37.0 37.1 37.7  PLT 140 - 400 Thousand/uL 428(H) 354 399  NEUTROABS 1,500 - 7,800 cells/uL 4,831 - 4.0  LYMPHSABS 850 - 3,900 cells/uL 587(L) - 0.7     There is no height or weight on file to calculate BMI.  Orders:  Orders Placed This Encounter  Procedures  . XR Os Calcis Right   No orders of the defined types were placed in this encounter.    Procedures: No procedures performed  Clinical Data: No additional findings.  ROS:  All other systems negative, except as noted in the HPI. Review of Systems  Objective: Vital Signs: There were no vitals taken for this visit.  Specialty Comments:  No specialty comments available.  PMFS History: Patient Active Problem List    Diagnosis Date Noted  . Iron deficiency anemia 05/10/2019  . Secondary hyperparathyroidism of renal origin ([place]) 09/20/2018  . Type 2 diabetes mellitus with stage 4 chronic kidney disease, with long-term current use of insulin ([place]) 12/08/2017  . ACE inhibitor intolerance 12/08/2017  . Vertigo of central origin 11/20/2015  . Morbid obesity ([place]) BMI 35+ with T2DM, htn, hyperlipidemia, CKD, OSA 12/12/2014  . OSA on CPAP 08/08/2014  . Type II diabetes mellitus with neurological manifestations ([place]) 09/15/2013  . Hyperlipidemia associated with type 2 diabetes mellitus ([place]) 06/27/2013  . Vitamin D deficiency 06/27/2013  . Seizure disorder ([place]) 11/04/2012  . Anemia 06/13/2008  . COLONIC POLYPS 06/09/2008  . Hypothyroidism 06/09/2008  . CKD stage 4 due to type 2 diabetes mellitus ([place]) 06/09/2008  . Venous (peripheral) insufficiency 06/09/2008  . Gastroesophageal reflux disease 06/09/2008  . Essential hypertension 06/09/2008   Past Medical History:  Diagnosis Date  . Anemia, unspecified   . Arthritis    "knees; right shoulder" (09/15/2013)  . Basal cell carcinoma    "right upper outer lip"  . Chronic kidney disease   . DM neuropathy, type II diabetes mellitus ([place])   . Dyspnea 2021   with low iron  . Epilepsy ([place])   . GERD (gastroesophageal reflux disease)   . Gout   . High cholesterol   . Hypertension   . Hypothyroidism   . Meniere's disease   . Obesity (BMI 30-39.9)   . Other forms of epilepsy and recurrent seizures without mention of intractable epilepsy 11/04/2012   Non convulsive paroxysmal spells, responding to [facility], patient not driving.   . Pneumonia ~ 1943  . Skin cancer    "forehead; right hand"  . Type II or unspecified type diabetes mellitus without mention of complication, not stated as uncontrolled   . UTI (urinary tract infection)   . Vitamin D deficiency     Family History  Problem Relation Age of Onset  . Heart disease Mother   . Stroke Mother   .  Heart disease Father   . Ovarian cancer Sister   . Hypertension Son   . Hypertension Son   . Diabetes Son   . Alcohol abuse Son     Past Surgical History:  Procedure Laterality Date  . CERVICAL POLYPECTOMY    . HAMMER TOE SURGERY Left 1980's  . MOHS SURGERY  20087   "right upper outer lip"  . TONSILLECTOMY  ~ 1947  . WRIST SURGERY Left ~ 1950   "ran arm thru window"   Social History   Occupational History  . Occupation: retired  Tobacco Use  . Smoking status: Former Smoker    Packs/day: 1.00    Years: 30.00  Pack years: 30.00    Types: Cigarettes    Quit date: 06/29/1983    Years since quitting: 36.3  . Smokeless tobacco: Never Used  Vaping Use  . Vaping Use: Never used  Substance and Sexual Activity  . Alcohol use: Yes    Alcohol/week: 0.0 standard drinks    Comment: 09/15/2013 "glass of wine maybe once/year"  . Drug use: No  . Sexual activity: Not Currently

## 2019-10-27 IMAGING — CT CT HEAD W/O CM
3 series · 14 of 47 positions shown, 16 images · non-contrast
Comparison: None.

CLINICAL DATA: Meniere's disease.  Dizziness.  Fall.

EXAM:
CT HEAD WITHOUT CONTRAST
TECHNIQUE: Contiguous axial images were obtained from the base of the skull
through the vertex without intravenous contrast.

[Series 3: head 5.0 h30s · axial · 0.50mm/px · z∈[-120,+25]mm · 8 of 35 slices shown, 10 images]
[im 3/35  brain]
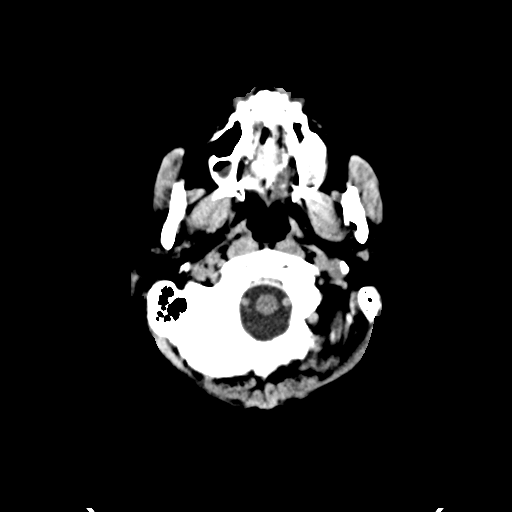
[im 3/35  bone]
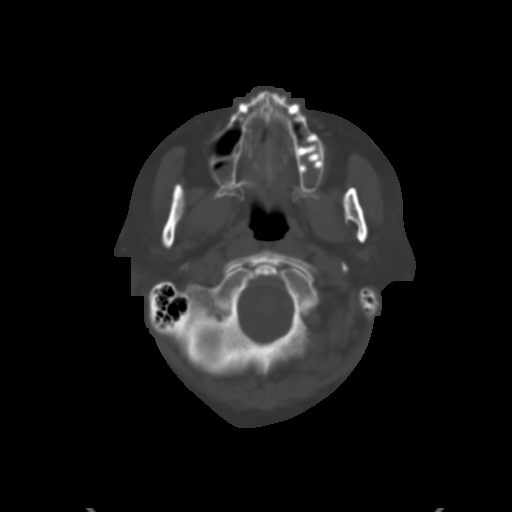
[im 8/35  brain]
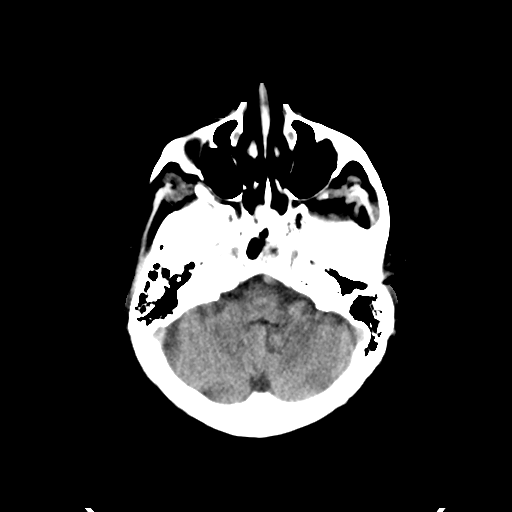
[im 11/35  brain]
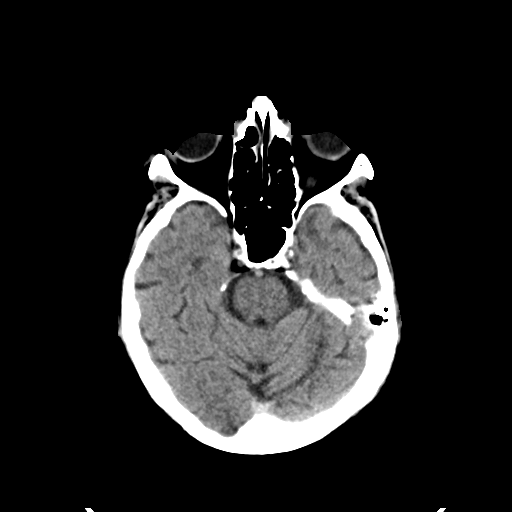
[im 16/35  brain]
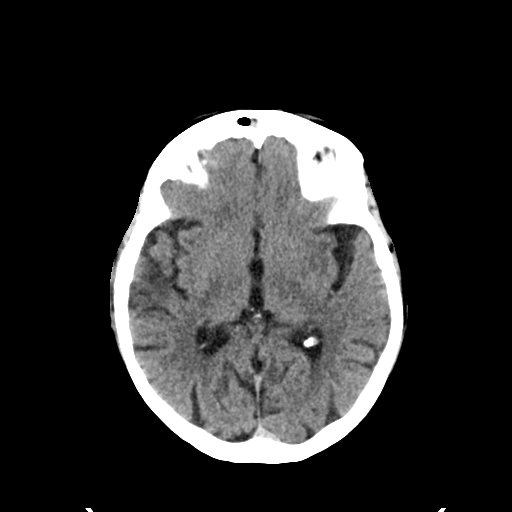
[im 19/35  brain]
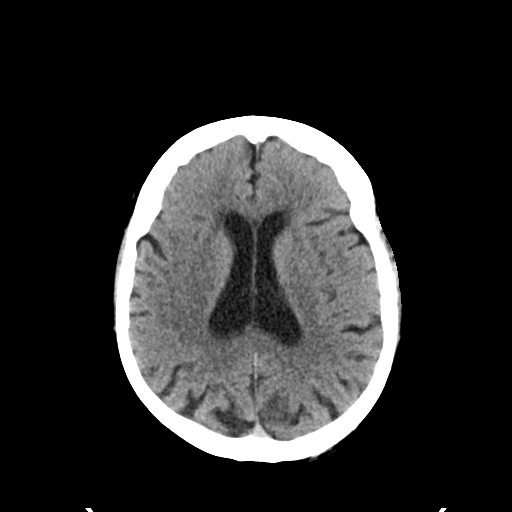
[im 19/35  bone]
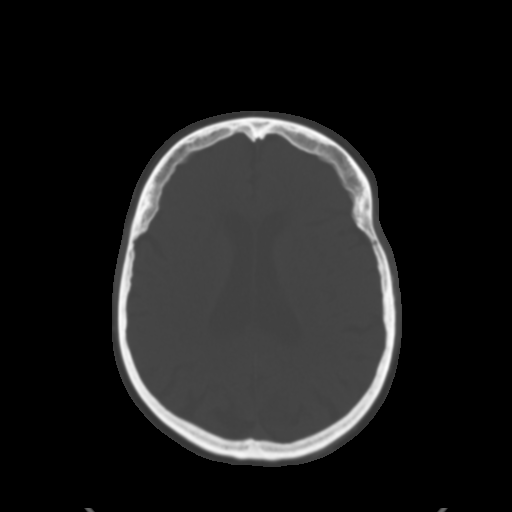
[im 24/35  brain]
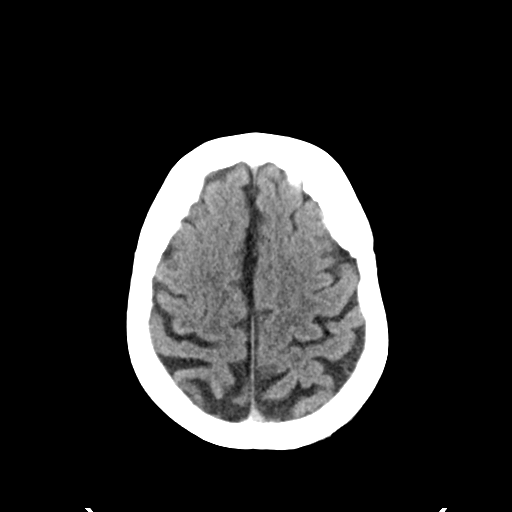
[im 27/35  brain]
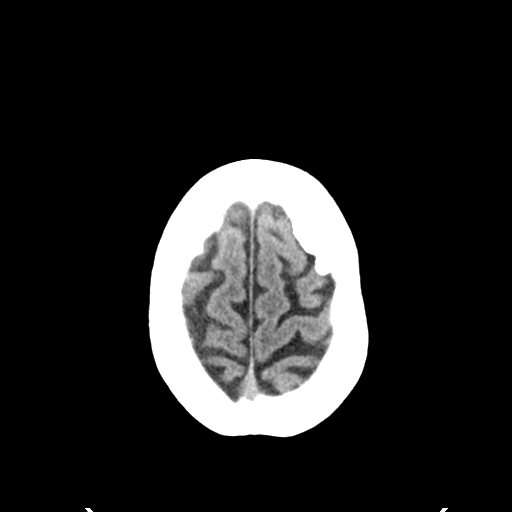
[im 32/35  brain]
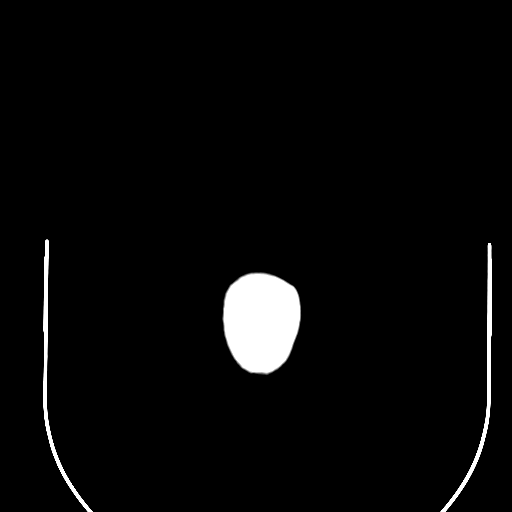

[Series 5: head 3.0 mpr cor · coronal · 0.33mm/px · 3 of 67 slices shown]
[im 23/67  brain]
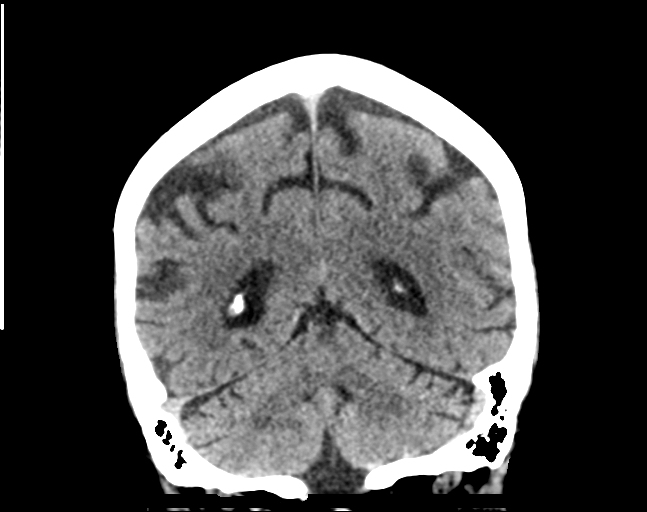
[im 30/67  brain]
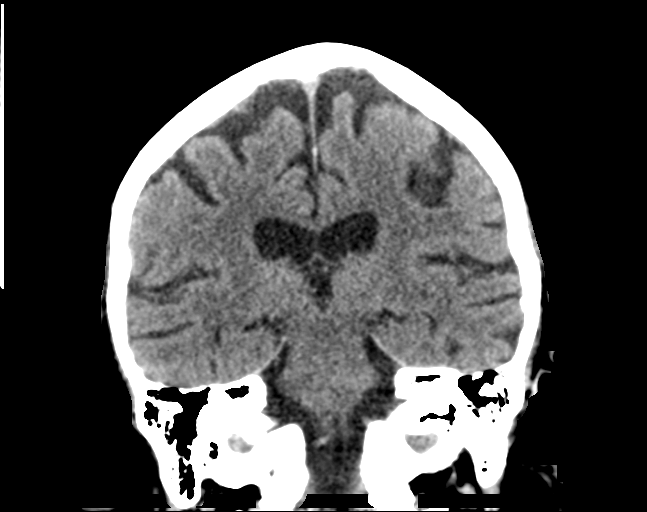
[im 37/67  brain]
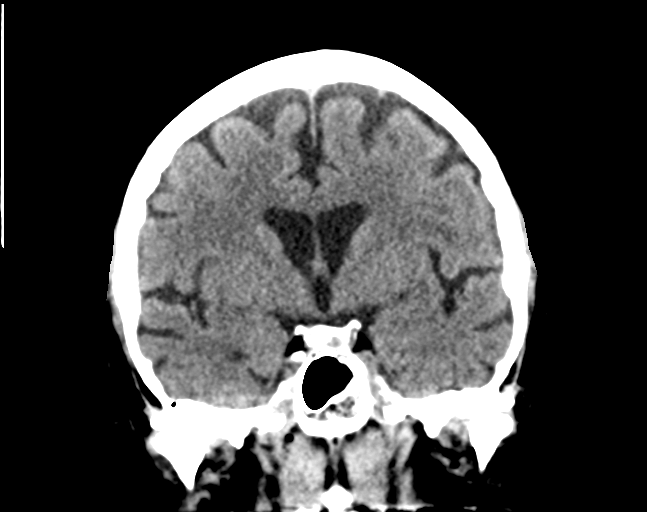

[Series 6: head 3.0 mpr sag · sagittal · 0.33mm/px · 3 of 67 slices shown]
[im 23/67  brain]
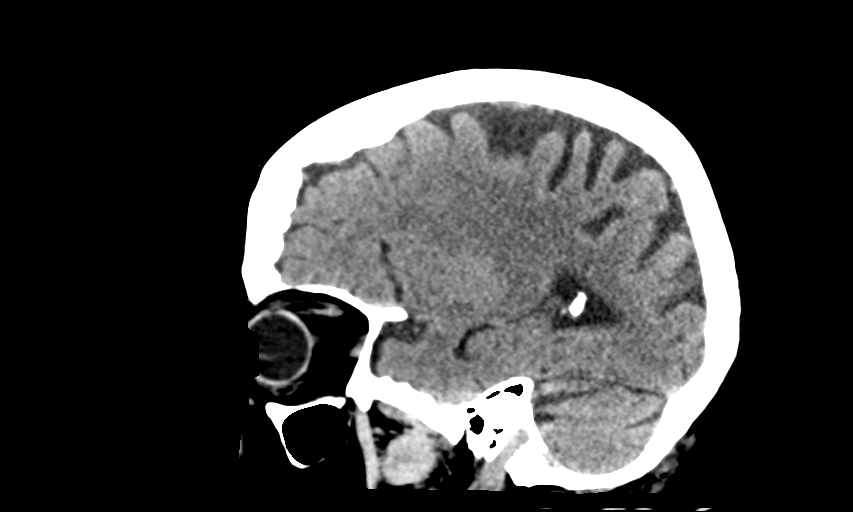
[im 34/67  brain]
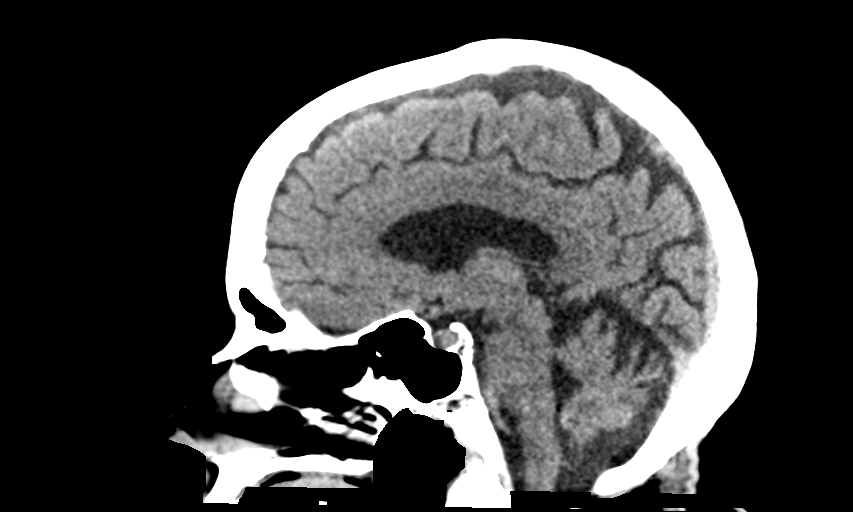
[im 45/67  brain]
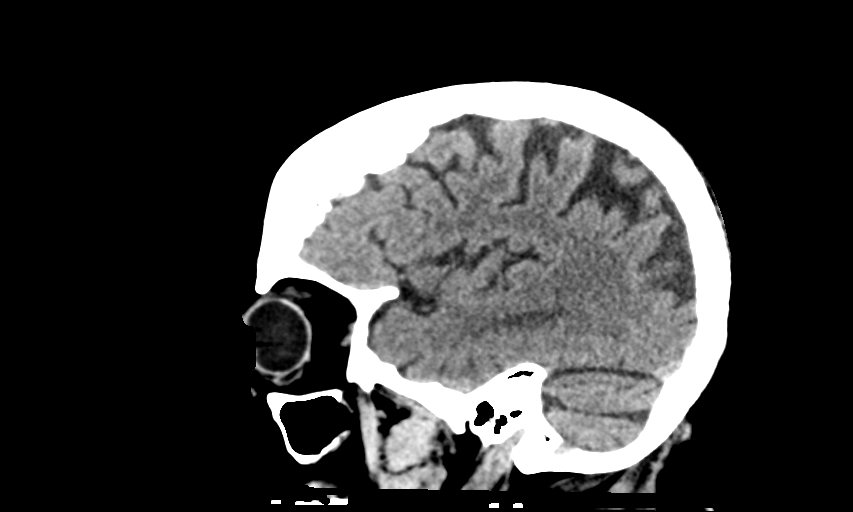

[14 of 47 positions shown; findings below may reference images not displayed]

FINDINGS: Brain: No subdural, epidural, or subarachnoid hemorrhage identified.
No mass effect or midline shift. Cerebellum, brainstem, and basal
cisterns are normal. Ventricles and sulci are unremarkable.
Scattered white matter changes. No evidence of acute cortical
ischemia or infarct.

Vascular: No hyperdense vessel or unexpected calcification.

Skull: Normal. Negative for fracture or focal lesion.

Sinuses/Orbits: Mucous retention cysts in the maxillary sinuses.
Mild opacification of the right frontal sinus. No air-fluid levels.
Mastoid air cells and middle ears are well aerated.

Other: None.
IMPRESSION: No acute intracranial abnormalities identified.

## 2019-10-27 IMAGING — DX DG CHEST 2V
2 series · 2 of 2 positions shown · non-contrast
Comparison: September 17, 2013

CLINICAL DATA: Dizziness.

EXAM:
CHEST - 2 VIEW

[w chest lat]
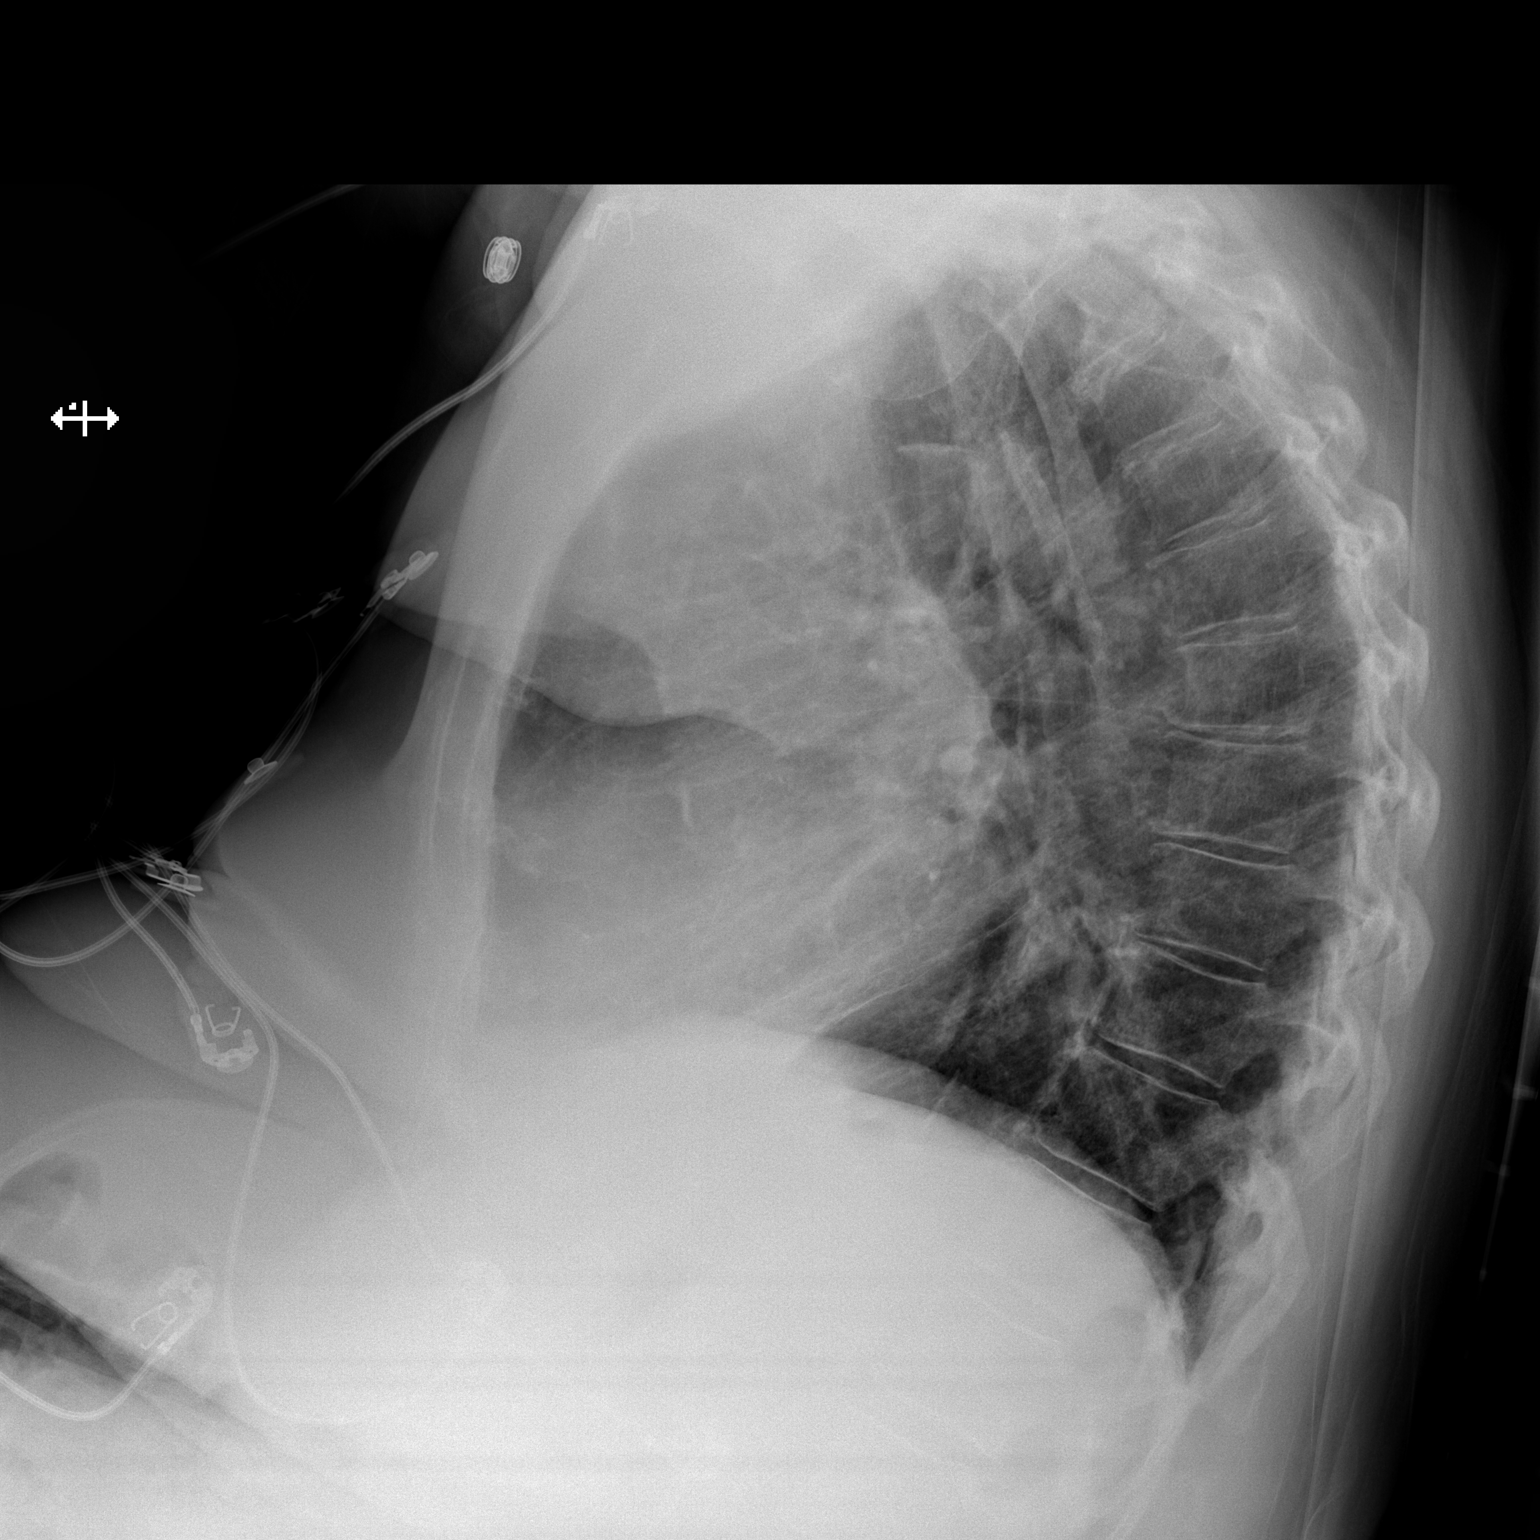

[w chest pa]
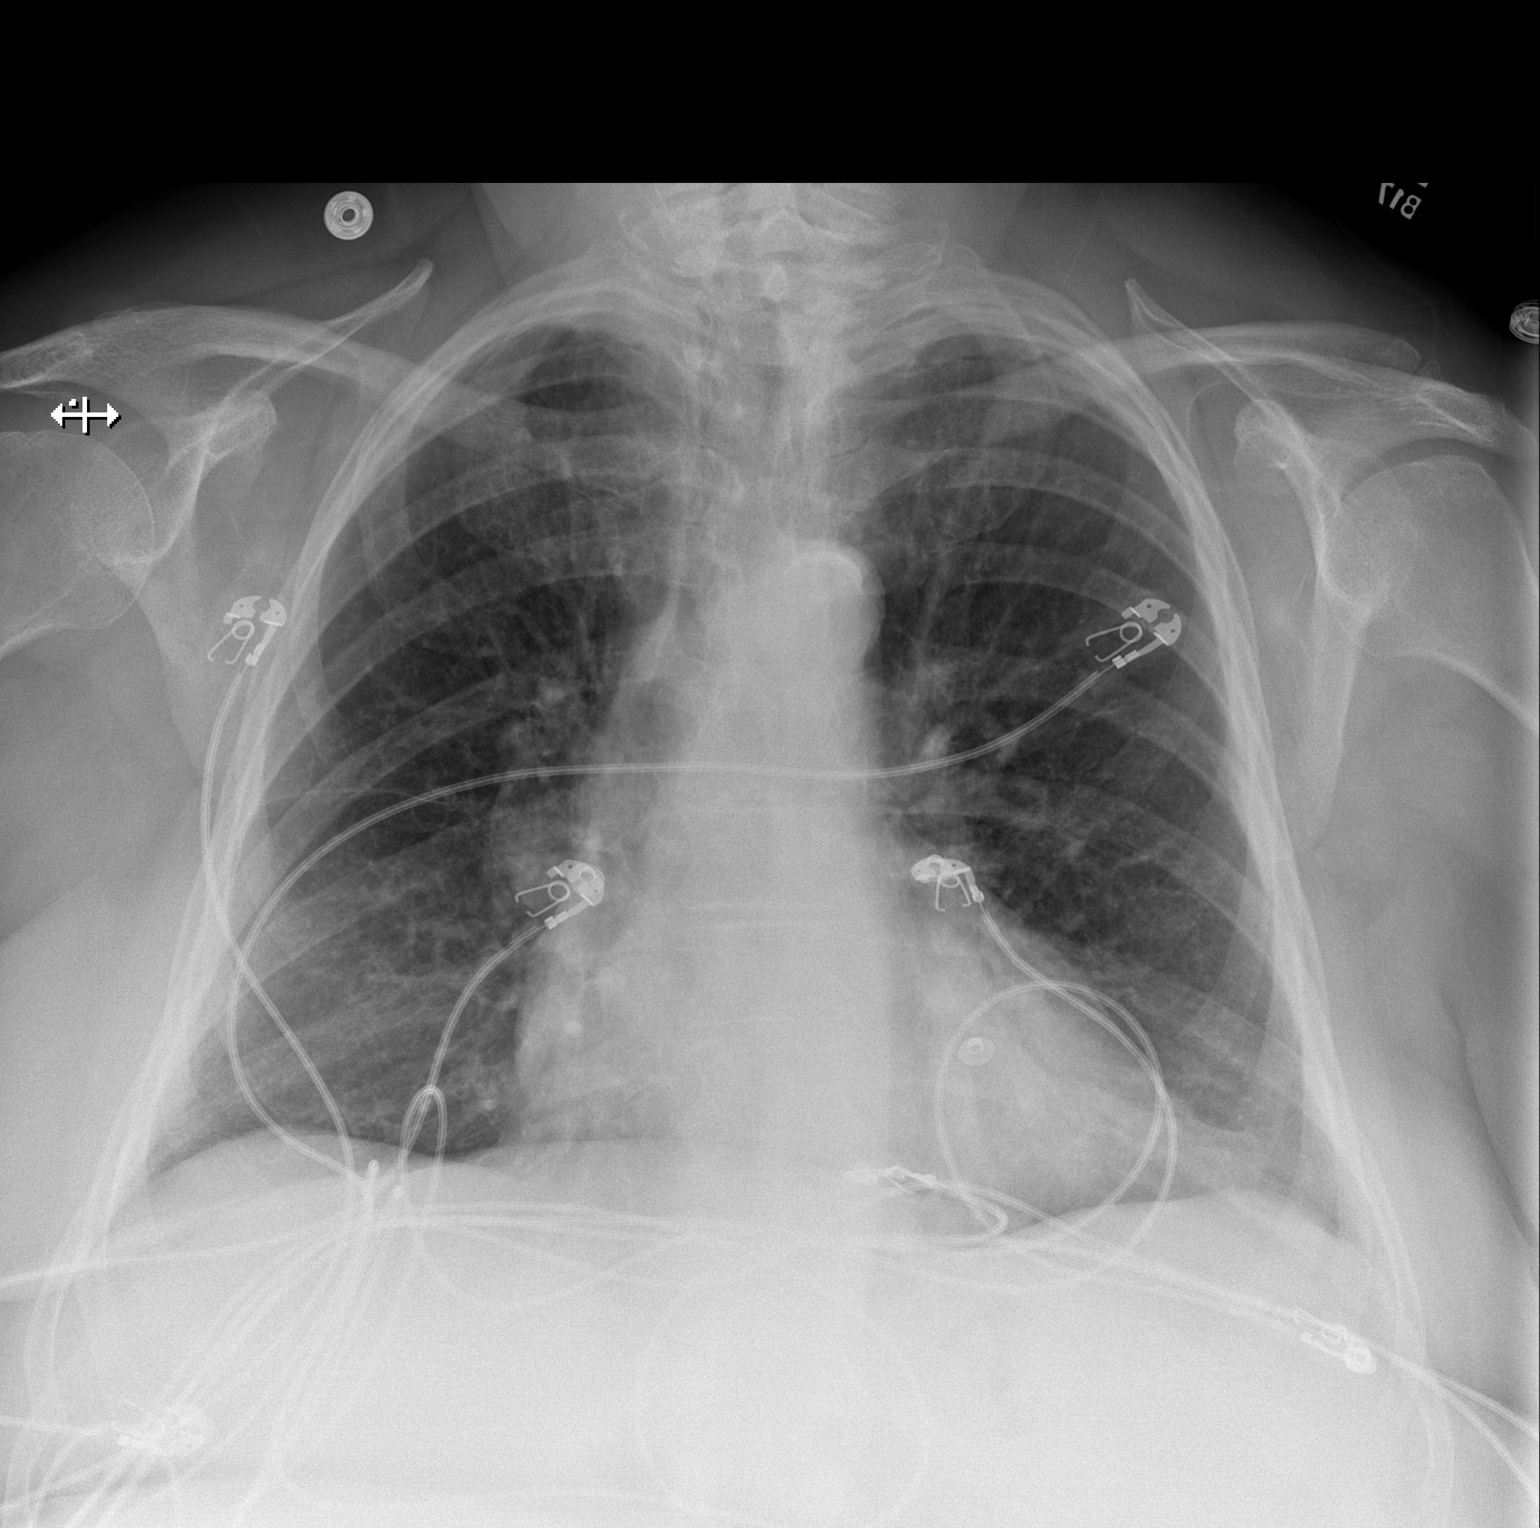

[2 of 2 positions shown; findings below may reference images not displayed]

FINDINGS: Mild cardiomegaly. The hila and mediastinum are normal. No pulmonary
nodules, masses, or focal infiltrates. Mild atelectasis in the left
base.
IMPRESSION: No active cardiopulmonary disease.

## 2019-10-31 ENCOUNTER — Encounter: Payer: Self-pay | Admitting: Internal Medicine

## 2019-10-31 NOTE — Progress Notes (Signed)
Annual Screening/Preventative Visit & Comprehensive Evaluation &  Examination      This very nice 86 y.o.  WWF presents for a Screening /Preventative Visit & comprehensive evaluation and management of multiple medical co-morbidities.  Patient has been followed for HTN, HLD, T2_IDDM  / CKD4  and Vitamin D Deficiency.  Patient has Gout controlled with her meds. Patient is also followed by Dr Brett Fairy for a Seizure Disorder.  Patient is followed by Dr Irene Limbo for anemia presumed of Kidney disease & had Bone Marrow to r/o smoldering MDS. Patient also has OSA on CPAP with improved restorative Sleep.        HTN predates circa 1999. Patient's BP has been controlled at home. Patient has hx by cardiac echo in 2015 of thickened Ao valve leaflets w/o stenosis or regurgitation.   Patient denies any cardiac symptoms as chest pain, palpitations, shortness of breath, dizziness or ankle swelling. Today's BP is at goal - 124/50.      Patient's hyperlipidemia is controlled with diet. Last lipids were at goal:  Lab Results  Component Value Date   CHOL 108 07/12/2019   HDL 28 (L) 07/12/2019   LDLCALC 59 07/12/2019   TRIG 127 07/12/2019   CHOLHDL 3.9 07/12/2019       Patient has hx/o  Morbid Obesity  (BMI 37+) and consequent insulin requiring T2_DM predating since  2001 and she also has CKD4  (GFR 26).   Patient is on bid Nov 70/30 reporting CBG's usu range betw    and    . Patient denies reactive hypoglycemic symptoms, visual blurring, diabetic polys, but does have numb  Paresthesias of her LE's.  Patient also has secondary HyperPTH and is followed by Nephrology (Dr Harrie Jeans). Last A1c was at goal:  Lab Results  Component Value Date   HGBA1C 4.9 07/12/2019       Patient  has been on thyroid replacement since the 1980's for Hypothyroidism.      Finally, patient has history of Vitamin D Deficiency ("37" / 2008) and last Vitamin D was low (goal 70-100):  Lab Results  Component Value Date   VD25OH 39  04/05/2019    Current Outpatient Medications on File Prior to Visit  Medication Sig  . acetaminophen (TYLENOL) 500 MG tablet Take 1,000 mg by mouth every 6 (six) hours as needed for moderate pain.  Marland Kitchen allopurinol (ZYLOPRIM) 100 MG tablet Take 0.5 tablets (50 mg total) by mouth every other day. (Patient taking differently: Take 50 mg by mouth daily. )  . aspirin 81 MG tablet Take 81 mg by mouth daily.  Marland Kitchen atenolol (TENORMIN) 50 MG tablet Take 1 tablet (50 mg total) by mouth daily. Take 1 tablet Daily for BP  . cholecalciferol (VITAMIN D3) 25 MCG (1000 UNIT) tablet Take 1,000 Units by mouth daily. Per Nephrologist, start taking 1000 units, but patient taking 5000 units presently.  . fish oil-omega-3 fatty acids 1000 MG capsule Take 2 g by mouth at bedtime.   . Flaxseed, Linseed, (FLAX SEED OIL) 1000 MG CAPS Take 1 capsule by mouth 3 (three) times daily.  . furosemide (LASIX) 40 MG tablet Take 1 tablet    Daily    for Fluid Retention / Ankle Swelling  . glucose blood (ONETOUCH VERIO) test strip Check blood sugar 3 times daily-[id number]  . insulin NPH-regular Human (NOVOLIN 70/30) (70-30) 100 UNIT/ML injection Takes 28 units qam and 24-26 units qpm  . Lancets (ONETOUCH ULTRASOFT) lancets Insulin dependent check sugars 2-3 x a  day- 3 month supply  . lansoprazole (PREVACID) 30 MG capsule Take 1 capsule Daily to Prevent Heart burn & Indigestion  . levETIRAcetam (KEPPRA) 500 MG tablet Take 1 tablet 3 x /day after Meals to Prevent Seizures  . levothyroxine (SYNTHROID) 100 MCG tablet Take 1 tablet daily on an empty stomach with only water for 30 minutes & no Antacid meds, Calcium or Magnesium for 4 hours & avoid Biotin  . loratadine (CLARITIN) 10 MG tablet Take 10 mg by mouth daily.  . meclizine (ANTIVERT) 25 MG tablet TAKE ONE TABLET BY MOUTH UP TO THREE TIMES PER DAY IF NEEDED FOR DIZZINESS/VERTIGO  . predniSONE (DELTASONE) 5 MG tablet 1 tablet daily for 5 days.  Marland Kitchen rOPINIRole (REQUIP) 1 MG tablet Take  1/2 to 1 tablet 3 x /day as needed for Restless Legs & Cramps  . vitamin B-12 (CYANOCOBALAMIN) 100 MCG tablet Take 500 mcg by mouth daily.   . vitamin C (ASCORBIC ACID) 500 MG tablet Take 1,000 mg by mouth 3 (three) times daily.    No current facility-administered medications on file prior to visit.   Allergies  Allergen Reactions  . Ppd [Tuberculin Purified Protein Derivative] Other (See Comments)    indurated   Past Medical History:  Diagnosis Date  . Anemia, unspecified   . Arthritis    "knees; right shoulder" (09/15/2013)  . Basal cell carcinoma    "right upper outer lip"  . Chronic kidney disease   . DM neuropathy, type II diabetes mellitus ([place])   . Dyspnea 2021   with low iron  . Epilepsy ([place])   . GERD (gastroesophageal reflux disease)   . Gout   . High cholesterol   . Hypertension   . Hypothyroidism   . Meniere's disease   . Obesity (BMI 30-39.9)   . Other forms of epilepsy and recurrent seizures without mention of intractable epilepsy 11/04/2012   Non convulsive paroxysmal spells, responding to [facility], patient not driving.   . Pneumonia ~ 1943  . Skin cancer    "forehead; right hand"  . Type II or unspecified type diabetes mellitus without mention of complication, not stated as uncontrolled   . UTI (urinary tract infection)   . Vitamin D deficiency    Health Maintenance  Topic Date Due  . OPHTHALMOLOGY EXAM  02/24/2019  . INFLUENZA VACCINE  09/19/2019  . URINE MICROALBUMIN  01/01/2020  . HEMOGLOBIN A1C  01/12/2020  . FOOT EXAM  10/30/2020  . TETANUS/TDAP  12/09/2027  . DEXA SCAN  Completed  . COVID-19 Vaccine  Completed  . PNA vac Low Risk Adult  Completed   Immunization History  Administered Date(s) Administered  . DTaP 03/18/2007  . Influenza, High Dose Seasonal PF 10/18/2013, 12/12/2014, 10/24/2015, 10/28/2016, 12/08/2017, 12/01/2018  . Influenza-Unspecified 11/18/2012  . PFIZER SARS-COV-2 Vaccination 03/10/2019, 03/31/2019  . Pneumococcal  Conjugate-13 12/12/2014  . Pneumococcal Polysaccharide-23 09/16/2013  . Td 12/08/2017  . Zoster 02/28/2005    Last Colon -  08/01/2008 - Dr Henrene Pastor - recc 5 yr f/u, but patient deferred  Last MGM - 11/19/2017 - Patient aware overdue having been sent reminder letters in 2019 and 2020  Past Surgical History:  Procedure Laterality Date  . CERVICAL POLYPECTOMY    . HAMMER TOE SURGERY Left 1980's  . MOHS SURGERY  [address]87   "right upper outer lip"  . TONSILLECTOMY  ~ 1947  . WRIST SURGERY Left ~ 1950   "ran arm thru window"   Family History  Problem Relation Age of  Onset  . Heart disease Mother   . Stroke Mother   . Heart disease Father   . Ovarian cancer Sister   . Hypertension Son   . Hypertension Son   . Diabetes Son   . Alcohol abuse Son    Social History   Tobacco Use  . Smoking status: Former Smoker    Packs/day: 1.00    Years: 30.00    Pack years: 30.00    Types: Cigarettes    Quit date: 06/29/1983    Years since quitting: 36.3  . Smokeless tobacco: Never Used  Vaping Use  . Vaping Use: Never used  Substance Use Topics  . Alcohol use: Yes    Alcohol/week: 0.0 standard drinks    Comment: 09/15/2013 "glass of wine maybe once/year"  . Drug use: No    ROS Constitutional: Denies fever, chills, weight loss/gain, headaches, insomnia,  night sweats, and change in appetite. Does c/o fatigue. Eyes: Denies redness, blurred vision, diplopia, discharge, itchy, watery eyes.  ENT: Denies discharge, congestion, post nasal drip, epistaxis, sore throat, earache, hearing loss, dental pain, Tinnitus, Vertigo, Sinus pain, snoring.  Cardio: Denies chest pain, palpitations, irregular heartbeat, syncope, dyspnea, diaphoresis, orthopnea, PND, claudication, edema Respiratory: denies cough, dyspnea, DOE, pleurisy, hoarseness, laryngitis, wheezing.  Gastrointestinal: Denies dysphagia, heartburn, reflux, water brash, pain, cramps, nausea, vomiting, bloating, diarrhea, constipation, hematemesis,  melena, hematochezia, jaundice, hemorrhoids Genitourinary: Denies dysuria, frequency, urgency, nocturia, hesitancy, discharge, hematuria, flank pain Breast: Breast lumps, nipple discharge, bleeding.  Musculoskeletal: Denies arthralgia, myalgia, stiffness, Jt. Swelling, pain, limp, and strain/sprain. Denies falls. Skin: Denies puritis, rash, hives, warts, acne, eczema, changing in skin lesion Neuro: No weakness, tremor, incoordination, spasms, paresthesia, pain Psychiatric: Denies confusion, memory loss, sensory loss. Denies Depression. Endocrine: Denies change in weight, skin, hair change, nocturia, and paresthesia, diabetic polys, visual blurring, hyper / hypo glycemic episodes.  Heme/Lymph: No excessive bleeding, bruising, enlarged lymph nodes.  Physical Exam  BP (!) 124/50   Pulse 76   Temp (!) 97.2 F (36.2 C)   Resp 16   Ht 5\' 2"  (1.575 m)   Wt 191 lb 1.6 oz (86.7 kg)   BMI 34.95 kg/m   General Appearance: Well nourished, well groomed and in no apparent distress.  Eyes: PERRLA, EOMs, conjunctiva no swelling or erythema, normal fundi and vessels. Sinuses: No frontal/maxillary tenderness ENT/Mouth: EACs patent / TMs  nl. Nares clear without erythema, swelling, mucoid exudates. Oral hygiene is good. No erythema, swelling, or exudate. Tongue normal, non-obstructing. Tonsils not swollen or erythematous. Hearing normal.  Neck: Supple, thyroid not palpable. No bruits, nodes or JVD. Respiratory: Respiratory effort normal.  BS equal and clear bilateral without rales, rhonci, wheezing or stridor. Cardio: Heart sounds are normal with regular rate and rhythm and a grade 2-3 systolic aortic murmur parasternal and transmitted to neck & carotids bilaterally. Peripheral pulses are normal and equal bilaterally without edema. No aortic or femoral bruits. Chest: symmetric with normal excursions and percussion. Breasts: Symmetric, without lumps, nipple discharge, retractions, or fibrocystic changes.    Abdomen: Flat, soft with bowel sounds active. Nontender, no guarding, rebound, hernias, masses, or organomegaly.  Lymphatics: Non tender without lymphadenopathy.  Musculoskeletal: Full ROM all peripheral extremities, joint stability, 5/5 strength, and normal gait. Skin: Warm and dry without rashes, lesions, cyanosis, clubbing or  ecchymosis.  Neuro: Cranial nerves intact, reflexes equal bilaterally. Normal muscle tone, no cerebellar symptoms. Sensation sl decreased to touch, vibratory and Monofilament to the toes bilaterally. Pysch: Alert and oriented X 3, normal affect,  Insight and Judgment appropriate.   Assessment and Plan  1. Annual Preventative Screening Examination    2. Essential hypertension  - Urinalysis, Routine w reflex microscopic - Microalbumin / creatinine urine ratio - CBC with Differential/Platelet - COMPLETE METABOLIC PANEL WITH GFR - Magnesium - TSH - EKG 12-Lead  3. Hyperlipidemia associated with type 2 diabetes mellitus (HCC)  - Lipid panel - TSH - Hemoglobin A1c - EKG 12-Lead  4. Type 2 diabetes mellitus with stage 4 chronic kidney disease, with long-term current use of insulin (HCC)  - Hemoglobin A1c - EKG 12-Lead  5. Vitamin D deficiency  - VITAMIN D 25 Hydroxy (Vit-D Deficiency, Fractures)  6. Type II diabetes mellitus with neurological manifestations (HCC)  - HM DIABETES FOOT EXAM - LOW EXTREMITY NEUR EXAM DOCUM - Hemoglobin A1c  7. Hypothyroidism, unspecified type  - TSH  8. Aortic valve thickening by Cardiac echo in 2015   9. Secondary hyperparathyroidism of renal origin ([place])  - COMPLETE METABOLIC PANEL WITH GFR - Parathyroid Hormone, Intact w/Ca  10. Seizure disorder (HCC)  - Levetiracetam level  11. Gout, tophaceous  - Uric acid  12. OSA on CPAP   13. Screening for colorectal cancer  - POC Hemoccult Bld/Stl (3-Cd Home Screen); Future  14. Screening for ischemic heart disease  - EKG 12-Lead  15. FHx: heart  disease  - EKG 12-Lead  16. Former smoker  - EKG 12-Lead  17. Medication management  - Urinalysis, Routine w reflex microscopic - Microalbumin / creatinine urine ratio - Uric acid - CBC with Differential/Platelet - COMPLETE METABOLIC PANEL WITH GFR - Magnesium - Lipid panel - TSH - Hemoglobin A1c - VITAMIN D 25 Hydroxyl - Levetiracetam level         Patient was counseled in prudent diet to achieve/maintain BMI less than 25 for weight control, BP monitoring, regular exercise and medications. Discussed med's effects and SE's. Screening labs and tests as requested with regular follow-up as recommended. Over 40 minutes of exam, counseling, chart review and high complex critical decision making was performed.   Kirtland Bouchard, MD

## 2019-10-31 NOTE — Patient Instructions (Addendum)

## 2019-11-01 ENCOUNTER — Other Ambulatory Visit: Payer: Self-pay

## 2019-11-01 ENCOUNTER — Ambulatory Visit: Payer: Medicare Other | Admitting: Internal Medicine

## 2019-11-01 VITALS — BP 124/50 | HR 76 | Temp 97.2°F | Resp 16 | Ht 62.0 in | Wt 191.1 lb

## 2019-11-01 DIAGNOSIS — I358 Other nonrheumatic aortic valve disorders: Secondary | ICD-10-CM

## 2019-11-01 DIAGNOSIS — Z87891 Personal history of nicotine dependence: Secondary | ICD-10-CM

## 2019-11-01 DIAGNOSIS — G40909 Epilepsy, unspecified, not intractable, without status epilepticus: Secondary | ICD-10-CM

## 2019-11-01 DIAGNOSIS — E559 Vitamin D deficiency, unspecified: Secondary | ICD-10-CM

## 2019-11-01 DIAGNOSIS — E1169 Type 2 diabetes mellitus with other specified complication: Secondary | ICD-10-CM

## 2019-11-01 DIAGNOSIS — Z0000 Encounter for general adult medical examination without abnormal findings: Secondary | ICD-10-CM

## 2019-11-01 DIAGNOSIS — I10 Essential (primary) hypertension: Secondary | ICD-10-CM | POA: Diagnosis not present

## 2019-11-01 DIAGNOSIS — Z136 Encounter for screening for cardiovascular disorders: Secondary | ICD-10-CM | POA: Diagnosis not present

## 2019-11-01 DIAGNOSIS — Z79899 Other long term (current) drug therapy: Secondary | ICD-10-CM

## 2019-11-01 DIAGNOSIS — E039 Hypothyroidism, unspecified: Secondary | ICD-10-CM

## 2019-11-01 DIAGNOSIS — Z9989 Dependence on other enabling machines and devices: Secondary | ICD-10-CM

## 2019-11-01 DIAGNOSIS — E1122 Type 2 diabetes mellitus with diabetic chronic kidney disease: Secondary | ICD-10-CM

## 2019-11-01 DIAGNOSIS — Z794 Long term (current) use of insulin: Secondary | ICD-10-CM

## 2019-11-01 DIAGNOSIS — E1149 Type 2 diabetes mellitus with other diabetic neurological complication: Secondary | ICD-10-CM

## 2019-11-01 DIAGNOSIS — Z8249 Family history of ischemic heart disease and other diseases of the circulatory system: Secondary | ICD-10-CM

## 2019-11-01 DIAGNOSIS — G4733 Obstructive sleep apnea (adult) (pediatric): Secondary | ICD-10-CM

## 2019-11-01 DIAGNOSIS — Z0001 Encounter for general adult medical examination with abnormal findings: Secondary | ICD-10-CM

## 2019-11-01 DIAGNOSIS — Z1211 Encounter for screening for malignant neoplasm of colon: Secondary | ICD-10-CM

## 2019-11-01 DIAGNOSIS — N2581 Secondary hyperparathyroidism of renal origin: Secondary | ICD-10-CM

## 2019-11-01 DIAGNOSIS — M1A9XX1 Chronic gout, unspecified, with tophus (tophi): Secondary | ICD-10-CM

## 2019-11-02 ENCOUNTER — Observation Stay (HOSPITAL_COMMUNITY)
Admission: EM | Admit: 2019-11-02 | Discharge: 2019-11-04 | Disposition: A | Discharge status: Home / Self Care | Payer: Medicare Other | Attending: Internal Medicine | Admitting: Internal Medicine

## 2019-11-02 ENCOUNTER — Encounter (HOSPITAL_COMMUNITY): Payer: Self-pay

## 2019-11-02 ENCOUNTER — Ambulatory Visit: Payer: Medicare Other | Admitting: Physician Assistant

## 2019-11-02 ENCOUNTER — Other Ambulatory Visit: Payer: Self-pay

## 2019-11-02 DIAGNOSIS — I129 Hypertensive chronic kidney disease with stage 1 through stage 4 chronic kidney disease, or unspecified chronic kidney disease: Secondary | ICD-10-CM | POA: Insufficient documentation

## 2019-11-02 DIAGNOSIS — E1140 Type 2 diabetes mellitus with diabetic neuropathy, unspecified: Secondary | ICD-10-CM | POA: Insufficient documentation

## 2019-11-02 DIAGNOSIS — G4733 Obstructive sleep apnea (adult) (pediatric): Secondary | ICD-10-CM | POA: Diagnosis not present

## 2019-11-02 DIAGNOSIS — N184 Chronic kidney disease, stage 4 (severe): Secondary | ICD-10-CM | POA: Insufficient documentation

## 2019-11-02 DIAGNOSIS — E039 Hypothyroidism, unspecified: Secondary | ICD-10-CM | POA: Diagnosis not present

## 2019-11-02 DIAGNOSIS — E1169 Type 2 diabetes mellitus with other specified complication: Secondary | ICD-10-CM | POA: Diagnosis present

## 2019-11-02 DIAGNOSIS — Z794 Long term (current) use of insulin: Secondary | ICD-10-CM

## 2019-11-02 DIAGNOSIS — D638 Anemia in other chronic diseases classified elsewhere: Secondary | ICD-10-CM | POA: Diagnosis present

## 2019-11-02 DIAGNOSIS — Z20822 Contact with and (suspected) exposure to covid-19: Secondary | ICD-10-CM | POA: Diagnosis not present

## 2019-11-02 DIAGNOSIS — E1122 Type 2 diabetes mellitus with diabetic chronic kidney disease: Secondary | ICD-10-CM | POA: Insufficient documentation

## 2019-11-02 DIAGNOSIS — D649 Anemia, unspecified: Secondary | ICD-10-CM | POA: Diagnosis present

## 2019-11-02 DIAGNOSIS — G40909 Epilepsy, unspecified, not intractable, without status epilepticus: Secondary | ICD-10-CM | POA: Diagnosis not present

## 2019-11-02 DIAGNOSIS — Z6834 Body mass index (BMI) 34.0-34.9, adult: Secondary | ICD-10-CM | POA: Diagnosis not present

## 2019-11-02 DIAGNOSIS — E785 Hyperlipidemia, unspecified: Secondary | ICD-10-CM | POA: Diagnosis present

## 2019-11-02 DIAGNOSIS — N189 Chronic kidney disease, unspecified: Secondary | ICD-10-CM

## 2019-11-02 DIAGNOSIS — Z9989 Dependence on other enabling machines and devices: Secondary | ICD-10-CM

## 2019-11-02 DIAGNOSIS — E669 Obesity, unspecified: Secondary | ICD-10-CM | POA: Diagnosis not present

## 2019-11-02 DIAGNOSIS — E7800 Pure hypercholesterolemia, unspecified: Secondary | ICD-10-CM | POA: Insufficient documentation

## 2019-11-02 DIAGNOSIS — I10 Essential (primary) hypertension: Secondary | ICD-10-CM | POA: Insufficient documentation

## 2019-11-02 HISTORY — DX: Chronic kidney disease, stage 4 (severe): N18.4

## 2019-11-02 LAB — IRON AND TIBC
Iron: 52 ug/dL (ref 28–170)
Saturation Ratios: 18 % (ref 10.4–31.8)
TIBC: 290 ug/dL (ref 250–450)
UIBC: 238 ug/dL

## 2019-11-02 LAB — RETICULOCYTES
Immature Retic Fract: 26.4 % — ABNORMAL HIGH (ref 2.3–15.9)
RBC.: 2.86 MIL/uL — ABNORMAL LOW (ref 3.87–5.11)
Retic Count, Absolute: 213 10*3/uL — ABNORMAL HIGH (ref 19.0–186.0)
Retic Ct Pct: 6.5 % — ABNORMAL HIGH (ref 0.4–3.1)

## 2019-11-02 LAB — CBC
HCT: 24.9 % — ABNORMAL LOW (ref 36.0–46.0)
HCT: 27.1 % — ABNORMAL LOW (ref 36.0–46.0)
Hemoglobin: 6.5 g/dL — CL (ref 12.0–15.0)
Hemoglobin: 7.2 g/dL — ABNORMAL LOW (ref 12.0–15.0)
MCH: 25.5 pg — ABNORMAL LOW (ref 26.0–34.0)
MCH: 25.3 pg — ABNORMAL LOW (ref 26.0–34.0)
MCHC: 26.1 g/dL — ABNORMAL LOW (ref 30.0–36.0)
MCHC: 26.6 g/dL — ABNORMAL LOW (ref 30.0–36.0)
MCV: 97.6 fL (ref 80.0–100.0)
MCV: 95.1 fL (ref 80.0–100.0)
Platelets: 321 10*3/uL (ref 150–400)
Platelets: 305 10*3/uL (ref 150–400)
RBC: 2.55 MIL/uL — ABNORMAL LOW (ref 3.87–5.11)
RBC: 2.85 MIL/uL — ABNORMAL LOW (ref 3.87–5.11)
RDW: 23 % — ABNORMAL HIGH (ref 11.5–15.5)
RDW: 23.8 % — ABNORMAL HIGH (ref 11.5–15.5)
WBC: 4.7 10*3/uL (ref 4.0–10.5)
WBC: 4.8 10*3/uL (ref 4.0–10.5)
nRBC: 0.9 % — ABNORMAL HIGH (ref 0.0–0.2)
nRBC: 0.6 % — ABNORMAL HIGH (ref 0.0–0.2)

## 2019-11-02 LAB — COMPREHENSIVE METABOLIC PANEL
ALT: 11 U/L (ref 0–44)
AST: 13 U/L — ABNORMAL LOW (ref 15–41)
Albumin: 3.8 g/dL (ref 3.5–5.0)
Alkaline Phosphatase: 50 U/L (ref 38–126)
Anion gap: 15 (ref 5–15)
BUN: 54 mg/dL — ABNORMAL HIGH (ref 8–23)
CO2: 17 mmol/L — ABNORMAL LOW (ref 22–32)
Calcium: 9.2 mg/dL (ref 8.9–10.3)
Chloride: 110 mmol/L (ref 98–111)
Creatinine, Ser: 2.23 mg/dL — ABNORMAL HIGH (ref 0.44–1.00)
GFR calc Af Amer: 23 mL/min — ABNORMAL LOW (ref >60)
GFR calc non Af Amer: 20 mL/min — ABNORMAL LOW (ref >60)
Glucose, Bld: 170 mg/dL — ABNORMAL HIGH (ref 70–99)
Potassium: 4.7 mmol/L (ref 3.5–5.1)
Sodium: 142 mmol/L (ref 135–145)
Total Bilirubin: 0.8 mg/dL (ref 0.3–1.2)
Total Protein: 6.2 g/dL — ABNORMAL LOW (ref 6.5–8.1)

## 2019-11-02 LAB — SARS CORONAVIRUS 2 BY RT PCR (HOSPITAL ORDER, PERFORMED IN ~~LOC~~ HOSPITAL LAB): SARS Coronavirus 2: NEGATIVE

## 2019-11-02 LAB — FERRITIN: Ferritin: 39 ng/mL (ref 11–307)

## 2019-11-02 LAB — VITAMIN B12: Vitamin B-12: 929 pg/mL — ABNORMAL HIGH (ref 180–914)

## 2019-11-02 LAB — PROTIME-INR
INR: 1.2 (ref 0.8–1.2)
Prothrombin Time: 14.9 seconds (ref 11.4–15.2)

## 2019-11-02 LAB — CBG MONITORING, ED
Glucose-Capillary: 66 mg/dL — ABNORMAL LOW (ref 70–99)
Glucose-Capillary: 153 mg/dL — ABNORMAL HIGH (ref 70–99)

## 2019-11-02 LAB — ABO/RH: ABO/RH(D): AB POS

## 2019-11-02 LAB — FOLATE: Folate: 11.7 ng/mL (ref >5.9)

## 2019-11-02 LAB — GLUCOSE, CAPILLARY: Glucose-Capillary: 129 mg/dL — ABNORMAL HIGH (ref 70–99)

## 2019-11-02 LAB — PREPARE RBC (CROSSMATCH)

## 2019-11-02 LAB — POC OCCULT BLOOD, ED: Fecal Occult Bld: NEGATIVE

## 2019-11-02 MED ORDER — LEVETIRACETAM 500 MG PO TABS
500.0000 mg | ORAL_TABLET | Freq: Three times a day (TID) | ORAL | Status: DC
  Administered 2019-11-02 – 2019-11-04 (×6): 500 mg via ORAL
  Filled 2019-11-02 (×6): qty 1

## 2019-11-02 MED ORDER — FUROSEMIDE 40 MG PO TABS
40.0000 mg | ORAL_TABLET | Freq: Every day | ORAL | Status: DC
  Administered 2019-11-02 – 2019-11-04 (×3): 40 mg via ORAL
  Filled 2019-11-02 (×3): qty 1

## 2019-11-02 MED ORDER — INSULIN ASPART 100 UNIT/ML ~~LOC~~ SOLN
0.0000 [IU] | Freq: Three times a day (TID) | SUBCUTANEOUS | Status: DC
  Administered 2019-11-03: 2 [IU] via SUBCUTANEOUS
  Administered 2019-11-03 – 2019-11-04 (×4): 1 [IU] via SUBCUTANEOUS
  Filled 2019-11-02: qty 0.09

## 2019-11-02 MED ORDER — LORATADINE 10 MG PO TABS
10.0000 mg | ORAL_TABLET | Freq: Every day | ORAL | Status: DC
  Administered 2019-11-03 – 2019-11-04 (×2): 10 mg via ORAL
  Filled 2019-11-02 (×2): qty 1

## 2019-11-02 MED ORDER — ONDANSETRON HCL 4 MG/2ML IJ SOLN: 4.0000 mg | Freq: Four times a day (QID) | INTRAMUSCULAR | Status: DC | PRN

## 2019-11-02 MED ORDER — SODIUM CHLORIDE 0.9 % IV SOLN
10.0000 mL/h | Freq: Once | INTRAVENOUS | Status: AC
  Administered 2019-11-02: 10 mL/h via INTRAVENOUS

## 2019-11-02 MED ORDER — ACETAMINOPHEN 650 MG RE SUPP: 650.0000 mg | Freq: Four times a day (QID) | RECTAL | Status: DC | PRN

## 2019-11-02 MED ORDER — PANTOPRAZOLE SODIUM 20 MG PO TBEC
20.0000 mg | DELAYED_RELEASE_TABLET | Freq: Every day | ORAL | Status: DC
  Administered 2019-11-03 – 2019-11-04 (×2): 20 mg via ORAL
  Filled 2019-11-02 (×2): qty 1

## 2019-11-02 MED ORDER — INSULIN ASPART PROT & ASPART (70-30 MIX) 100 UNIT/ML ~~LOC~~ SUSP
20.0000 [IU] | Freq: Two times a day (BID) | SUBCUTANEOUS | Status: DC
  Administered 2019-11-03 – 2019-11-04 (×3): 20 [IU] via SUBCUTANEOUS
  Filled 2019-11-02 (×2): qty 10

## 2019-11-02 MED ORDER — ASPIRIN EC 81 MG PO TBEC
81.0000 mg | DELAYED_RELEASE_TABLET | Freq: Every day | ORAL | Status: DC
  Administered 2019-11-03 – 2019-11-04 (×2): 81 mg via ORAL
  Filled 2019-11-02 (×2): qty 1

## 2019-11-02 MED ORDER — ATENOLOL 50 MG PO TABS
50.0000 mg | ORAL_TABLET | Freq: Every day | ORAL | Status: DC
  Administered 2019-11-03 – 2019-11-04 (×2): 50 mg via ORAL
  Filled 2019-11-02 (×2): qty 1

## 2019-11-02 MED ORDER — LEVOTHYROXINE SODIUM 100 MCG PO TABS
100.0000 ug | ORAL_TABLET | Freq: Every day | ORAL | Status: DC
  Administered 2019-11-03 – 2019-11-04 (×2): 100 ug via ORAL
  Filled 2019-11-02 (×2): qty 1

## 2019-11-02 MED ORDER — ONDANSETRON HCL 4 MG PO TABS: 4.0000 mg | ORAL_TABLET | Freq: Four times a day (QID) | ORAL | Status: DC | PRN

## 2019-11-02 MED ORDER — ALLOPURINOL 100 MG PO TABS
50.0000 mg | ORAL_TABLET | Freq: Every day | ORAL | Status: DC
  Administered 2019-11-03 – 2019-11-04 (×2): 50 mg via ORAL
  Filled 2019-11-02 (×2): qty 0.5

## 2019-11-02 MED ORDER — ACETAMINOPHEN 325 MG PO TABS: 650.0000 mg | ORAL_TABLET | Freq: Four times a day (QID) | ORAL | Status: DC | PRN

## 2019-11-02 MED ORDER — ENOXAPARIN SODIUM 30 MG/0.3ML ~~LOC~~ SOLN
30.0000 mg | SUBCUTANEOUS | Status: DC
  Administered 2019-11-02 – 2019-11-03 (×2): 30 mg via SUBCUTANEOUS
  Filled 2019-11-02 (×2): qty 0.3

## 2019-11-02 MED ORDER — ROPINIROLE HCL 1 MG PO TABS
1.0000 mg | ORAL_TABLET | Freq: Three times a day (TID) | ORAL | Status: DC | PRN
  Filled 2019-11-02: qty 1

## 2019-11-02 NOTE — Progress Notes (Signed)
Pt refused cpap tonight.  Pt was encouraged to call should she change her mind.  RN aware. 

## 2019-11-02 NOTE — H&P (Signed)
History and Physical    [name] [name] FXT:[id number] DOB: 01/20/1933 DOA: 11/02/2019  PCP: Unk Pinto, MD Consultants:  Irene Limbo - oncology; Sharol Given- orthopedics; Royce Macadamia - nephrology Patient coming from: [facility]; NOK: Ayse, [place], [phone]  Chief Complaint: Anemia  HPI: [name] [name] is a 86 y.o. female with medical history significant of morbid obesity; anemia; stage 4 CKD;  DM; epilepsy; obesity (BMI 35); hypothyroidism; HTN; HLD presenting with anemia.  She reports that she had a physical yesterday.  They her blood counts to be low so her PCP sent her here this AM.  She has been feeling weak and out of breath.  She had iron infusions starting back in March x 3.  She was feeling better after the iron.  She has an appointment with Dr. Irene Limbo on 10/8.  She also had a BM biopsy.  She is not having bleeding.    ED Course:  H/o anemia, treated with IV iron (?last infusion).  Routine PCP visit yesterday, went from 11 to 6.5.  Mild symptoms.  No bleeding, heme negative.    Review of Systems: As per HPI; otherwise review of systems reviewed and negative.   Ambulatory Status:  Ambulates without assistance or with a cane/walker  COVID Vaccine Status:   Complete  Past Medical History:  Diagnosis Date  . Anemia, unspecified   . Arthritis    "knees; right shoulder" (09/15/2013)  . Basal cell carcinoma    "right upper outer lip"  . DM neuropathy, type II diabetes mellitus ([place])   . Dyspnea 2021   with low iron  . GERD (gastroesophageal reflux disease)   . Gout   . High cholesterol   . Hypertension   . Hypothyroidism   . Meniere's disease   . Obesity (BMI 30-39.9)   . Other forms of epilepsy and recurrent seizures without mention of intractable epilepsy 11/04/2012   Non convulsive paroxysmal spells, responding to [facility], patient not driving.   . Pneumonia ~ 1943  . Skin cancer    "forehead; right hand"  . Stage 4 chronic kidney disease ([place])   . UTI (urinary tract  infection)   . Vitamin D deficiency     Past Surgical History:  Procedure Laterality Date  . CERVICAL POLYPECTOMY    . HAMMER TOE SURGERY Left 1980's  . MOHS SURGERY  20087   "right upper outer lip"  . TONSILLECTOMY  ~ 1947  . WRIST SURGERY Left ~ 1950   "ran arm thru window"    Social History   Socioeconomic History  . Marital status: Widowed    Spouse name: Not on file  . Number of children: 3  . Years of education: 76  . Highest education level: Not on file  Occupational History  . Occupation: retired  Tobacco Use  . Smoking status: Former Smoker    Packs/day: 1.00    Years: 30.00    Pack years: 30.00    Types: Cigarettes    Quit date: 06/29/1983    Years since quitting: 36.3  . Smokeless tobacco: Never Used  Vaping Use  . Vaping Use: Never used  Substance and Sexual Activity  . Alcohol use: Yes    Alcohol/week: 0.0 standard drinks    Comment: 09/15/2013 "glass of wine maybe once/year"  . Drug use: No  . Sexual activity: Not Currently  Other Topics Concern  . Not on file  Social History Narrative   Patient lives at home alone and she is widowed.   Retired.  Education some college.   Right handed.   Caffeine sometimes not daily.    Social Determinants of Health   Financial Resource Strain:   . Difficulty of Paying Living Expenses: Not on file  Food Insecurity:   . Worried About Charity fundraiser in the Last Year: Not on file  . Ran Out of Food in the Last Year: Not on file  Transportation Needs:   . Lack of Transportation (Medical): Not on file  . Lack of Transportation (Non-Medical): Not on file  Physical Activity:   . Days of Exercise per Week: Not on file  . Minutes of Exercise per Session: Not on file  Stress:   . Feeling of Stress : Not on file  Social Connections:   . Frequency of Communication with Friends and Family: Not on file  . Frequency of Social Gatherings with Friends and Family: Not on file  . Attends Religious Services: Not on  file  . Active Member of Clubs or Organizations: Not on file  . Attends Archivist Meetings: Not on file  . Marital Status: Not on file  Intimate Partner Violence:   . Fear of Current or Ex-Partner: Not on file  . Emotionally Abused: Not on file  . Physically Abused: Not on file  . Sexually Abused: Not on file    Allergies  Allergen Reactions  . Ppd [Tuberculin Purified Protein Derivative] Other (See Comments)    indurated    Family History  Problem Relation Age of Onset  . Heart disease Mother   . Stroke Mother   . Heart disease Father   . Ovarian cancer Sister   . Hypertension Son   . Hypertension Son   . Diabetes Son   . Alcohol abuse Son     Prior to Admission medications   Medication Sig Start Date End Date Taking? Authorizing Provider  acetaminophen (TYLENOL) 500 MG tablet Take 1,000 mg by mouth every 6 (six) hours as needed for moderate pain.    [provider]  allopurinol (ZYLOPRIM) 100 MG tablet Take 0.5 tablets (50 mg total) by mouth every other day. Patient taking differently: Take 50 mg by mouth daily.  09/15/19 09/14/20  Vicie Mutters, PA-C  aspirin 81 MG tablet Take 81 mg by mouth daily.    [provider]  atenolol (TENORMIN) 50 MG tablet Take 1 tablet (50 mg total) by mouth daily. Take 1 tablet Daily for BP 02/18/19   Unk Pinto, MD  cholecalciferol (VITAMIN D3) 25 MCG (1000 UNIT) tablet Take 1,000 Units by mouth daily. Per Nephrologist, start taking 1000 units, but patient taking 5000 units presently.    [provider]  fish oil-omega-3 fatty acids 1000 MG capsule Take 2 g by mouth at bedtime.     [provider]  Flaxseed, Linseed, (FLAX SEED OIL) 1000 MG CAPS Take 1 capsule by mouth 3 (three) times daily.    [provider]  furosemide (LASIX) 40 MG tablet Take 1 tablet    Daily    for Fluid Retention / Ankle Swelling 10/11/19   Unk Pinto, MD  glucose blood [facility] VERIO) test strip Check  blood sugar 3 times daily-[id number] 04/05/19   Unk Pinto, MD  insulin NPH-regular Human (NOVOLIN 70/30) (70-30) 100 UNIT/ML injection Takes 28 units qam and 24-26 units qpm 07/12/19   [name], NP  Lancets [facility] ULTRASOFT) lancets Insulin dependent check sugars 2-3 x a day- 3 month supply 09/15/19   Vicie Mutters, PA-C  lansoprazole (  PREVACID) 30 MG capsule Take 1 capsule Daily to Prevent Heart burn & Indigestion 08/16/19   Unk Pinto, MD  levETIRAcetam (KEPPRA) 500 MG tablet Take 1 tablet 3 x /day after Meals to Prevent Seizures 01/12/19   Unk Pinto, MD  levothyroxine (SYNTHROID) 100 MCG tablet Take 1 tablet daily on an empty stomach with only water for 30 minutes & no Antacid meds, Calcium or Magnesium for 4 hours & avoid Biotin 08/16/19   Unk Pinto, MD  loratadine (CLARITIN) 10 MG tablet Take 10 mg by mouth daily.    [provider]  meclizine (ANTIVERT) 25 MG tablet TAKE ONE TABLET BY MOUTH UP TO THREE TIMES PER DAY IF NEEDED FOR DIZZINESS/VERTIGO 07/26/19   Unk Pinto, MD  predniSONE (DELTASONE) 5 MG tablet 1 tablet daily for 5 days. 10/01/19   Vicie Mutters, PA-C  rOPINIRole (REQUIP) 1 MG tablet Take 1/2 to 1 tablet 3 x /day as needed for Restless Legs & Cramps 04/05/19   Unk Pinto, MD  vitamin B-12 (CYANOCOBALAMIN) 100 MCG tablet Take 500 mcg by mouth daily.     [provider]  vitamin C (ASCORBIC ACID) 500 MG tablet Take 1,000 mg by mouth 3 (three) times daily.     [provider]    Physical Exam: Vitals:   11/02/19 1600 11/02/19 1630 11/02/19 1700 11/02/19 1730  BP: (!) 105/91 125/63  (!) 150/81  Pulse: 67 69 73 73  Resp: '16 14 16 20  ' Temp:    (!) 97.2 F (36.2 C)  TempSrc:    Axillary  SpO2: 96% 97% 97% 98%     . General:  Appears calm and comfortable and is NAD . Eyes:  PERRL, EOMI, normal lids, iris . ENT:  Hard of hearing, grossly normal lips & tongue, mmm; appropriate dentition . Neck:  no LAD,  masses or thyromegaly . Cardiovascular:  RRR, no m/r/g. No LE edema.  Marland Kitchen Respiratory:   CTA bilaterally with no wheezes/rales/rhonchi.  Normal respiratory effort. . Abdomen:  soft, NT, ND, NABS . Skin:  no rash or induration seen on limited exam . Musculoskeletal:  grossly normal tone BUE/BLE, good ROM, no bony abnormality . Psychiatric:  blunted mood and affect, speech fluent and appropriate, AOx3 . Neurologic:  CN 2-12 grossly intact, moves all extremities in coordinated fashion    Radiological Exams on Admission: No results found.  EKG: Independently reviewed.  NSR with rate 78; RBBB; LAFB; no evidence of acute ischemia   Labs on Admission: I have personally reviewed the available labs and imaging studies at the time of the admission.  Pertinent labs:   CO2 17 Glucose 170 BUN 54/Creatinine 2.23/GFR 20 - appears to be at/near baseline WBC 4.7 Hgb 6.5; 6.2 on 9/13; 11.0 on 8/11 INR 1.2 COVID pending   Assessment/Plan Principal Problem:   Symptomatic anemia Active Problems:   Hypothyroidism   CKD stage 4 due to type 2 diabetes mellitus ([place])   Essential hypertension   Hyperlipidemia associated with type 2 diabetes mellitus ([facility])   OSA on CPAP   Epilepsy ([place])   Obesity (BMI 30-39.9)   Symptomatic anemia -Patient with prior diagnosis of iron deficiency anemia for which she received IV iron infusions on 3/22, 3/29, and 5/12 -Prior evaluation for anemia with bone marrow biopsy on 08/10/19 concerning for MDS, but flow pathology negative -Also with advanced CKD -No evidence of GI bleeding -Anemia is likely developing MDS in conjunction with advanced CKD -Hgb 6.5, up from 6.2 yesterday but down from  11.0 on 8/11 -MCV 97.6, RDW 23.0 -Heme testing was negative today and patient denies h/o apparent GI bleeding; she has had repeatedly negative stool tests dating back to at least 2015 -Will observe in med surg bed. -Transfuse 1 unit PRBC to start and recheck Hgb afterwards.    -Responded very well to IV iron infusion previously; consider repeat Feraheme infusion -needs outpatient oncology f/u with Dr. Irene Limbo, scheduled for 10/8 -Iron panel added on per Dr. Irene Limbo, and he recommends GI evaluation if she is this rapidly iron deficient again since MDS plus CKD would not generally cause such an abrupt decline  DM -Yesterday's A1c was 4.4, raising the concern that she is experiencing hypoglycemia - perhaps without even realizing it -For now will decrease 70/30 to 20 units and cover with SSI  Stage 4 CKD -Progressive renal insufficiency, but it appears to be stable at this time -Will follow  HTN -Continue Atenolol  HLD -Does not appear to be taking a statin but is taking fish oil and flax seed oil - will hold both due to limited inpatient utility  Epilepsy -Continue Keppra  OSA on CPAP -Continue CPAP   Hypothyroidism -Normal TSH yesterday -Continue Synthroid at current dose for now  Obesity -BMI 34.95 -Weight loss should be encouraged -Outpatient PCP/bariatric medicine f/u encouraged    Note: This patient has been tested and is negative for the novel coronavirus COVID-19.  DVT prophylaxis:  Lovenox  Code Status:  Partial - confirmed with patient Family Communication: None present Disposition Plan:  The patient is from: home  Anticipated d/c is to: home without [facility] services once her cardiology issues have been resolved.  Anticipated d/c date will depend on clinical response to treatment, but possibly as early as tomorrow if she has excellent response to treatment  Patient is currently: acutely ill Consults called: Oncology - Secure Chat only Admission status:  It is my clinical opinion that referral for OBSERVATION is reasonable and necessary in this patient based on the above information provided. The aforementioned taken together are felt to place the patient at high risk for further clinical deterioration. However it is anticipated that the patient  may be medically stable for discharge from the hospital within 24 to 48 hours.    Karmen Bongo MD [facility]   How to contact the [facility] Attending or Consulting provider [place] or covering provider during after hours [place], for this patient?  1. Check the care team in [facility] and look for a) attending/consulting [facility] provider listed and b) the [facility] team listed 2. Log into [email or web address] and use [place]'s universal password to access. If you do not have the password, please contact the hospital operator. 3. Locate the [facility] provider you are looking for under [facility] and page to a number that you can be directly reached. 4. If you still have difficulty reaching the provider, please page the [facility] (Director on Call) for the Hospitalists listed on amion for assistance.   11/02/2019, 6:01 PM

## 2019-11-02 NOTE — Plan of Care (Signed)
POC initiated 

## 2019-11-02 NOTE — ED Notes (Signed)
Blood bank has 1 unit of blood ready for this patient, informed Danielle,RN.

## 2019-11-02 NOTE — ED Provider Notes (Signed)
[facility] DEPT Provider Note   CSN: [id number] Arrival date & time: 11/02/19  1046     History Chief Complaint  Patient presents with  . Abnormal Lab    [name] [name] is a 86 y.o. female.  Patient is here at the request of her primary care doctor after being seen yesterday for routine physical.  She had blood work done at that time and her doctor found that she was anemic.  Has not seen any obvious external bleeding.  Has been told she is iron deficient in the past and needed to get iron infusions.  Does not recall the last time she got an iron infusion.  She endorses some shortness of breath fatigue and generalized weakness.  No chest pain.  The history is provided by the patient.  Abnormal Lab Time since result:  1 day Patient referred by:  PCP Result type: hematology   Hematology:    Hemoglobin:  Low Shortness of Breath Severity:  Moderate Onset quality:  Gradual Timing:  Intermittent Progression:  Unchanged Chronicity:  New Context: activity   Relieved by:  Rest Worsened by:  Activity Ineffective treatments:  None tried Associated symptoms: no abdominal pain, no chest pain, no cough, no fever, no headaches, no hemoptysis, no rash and no sore throat        Past Medical History:  Diagnosis Date  . Anemia, unspecified   . Arthritis    "knees; right shoulder" (09/15/2013)  . Basal cell carcinoma    "right upper outer lip"  . Chronic kidney disease   . DM neuropathy, type II diabetes mellitus ([place])   . Dyspnea 2021   with low iron  . Epilepsy ([place])   . GERD (gastroesophageal reflux disease)   . Gout   . High cholesterol   . Hypertension   . Hypothyroidism   . Meniere's disease   . Obesity (BMI 30-39.9)   . Other forms of epilepsy and recurrent seizures without mention of intractable epilepsy 11/04/2012   Non convulsive paroxysmal spells, responding to [facility], patient not driving.   . Pneumonia ~ 1943  . Skin cancer     "forehead; right hand"  . Type II or unspecified type diabetes mellitus without mention of complication, not stated as uncontrolled   . UTI (urinary tract infection)   . Vitamin D deficiency     Patient Active Problem List   Diagnosis Date Noted  . Aortic valve thickening by Cardiac echo in 2015 11/01/2019  . Iron deficiency anemia 05/10/2019  . Secondary hyperparathyroidism of renal origin ([place]) 09/20/2018  . Type 2 diabetes mellitus with stage 4 chronic kidney disease, with long-term current use of insulin ([place]) 12/08/2017  . ACE inhibitor intolerance 12/08/2017  . Vertigo of central origin 11/20/2015  . Morbid obesity ([place]) BMI 35+ with T2DM, htn, hyperlipidemia, CKD, OSA 12/12/2014  . OSA on CPAP 08/08/2014  . Type II diabetes mellitus with neurological manifestations ([place]) 09/15/2013  . Hyperlipidemia associated with type 2 diabetes mellitus ([place]) 06/27/2013  . Vitamin D deficiency 06/27/2013  . Seizure disorder ([place]) 11/04/2012  . Anemia 06/13/2008  . COLONIC POLYPS 06/09/2008  . Hypothyroidism 06/09/2008  . CKD stage 4 due to type 2 diabetes mellitus ([place]) 06/09/2008  . Venous (peripheral) insufficiency 06/09/2008  . Gastroesophageal reflux disease 06/09/2008  . Essential hypertension 06/09/2008    Past Surgical History:  Procedure Laterality Date  . CERVICAL POLYPECTOMY    . HAMMER TOE SURGERY Left 1980's  . MOHS SURGERY  20087   "right upper outer lip"  . TONSILLECTOMY  ~ 1947  . WRIST SURGERY Left ~ 1950   "ran arm thru window"     OB History   No obstetric history on file.     Family History  Problem Relation Age of Onset  . Heart disease Mother   . Stroke Mother   . Heart disease Father   . Ovarian cancer Sister   . Hypertension Son   . Hypertension Son   . Diabetes Son   . Alcohol abuse Son     Social History   Tobacco Use  . Smoking status: Former Smoker    Packs/day: 1.00    Years: 30.00    Pack years: 30.00    Types: Cigarettes    Quit  date: 06/29/1983    Years since quitting: 36.3  . Smokeless tobacco: Never Used  Vaping Use  . Vaping Use: Never used  Substance Use Topics  . Alcohol use: Yes    Alcohol/week: 0.0 standard drinks    Comment: 09/15/2013 "glass of wine maybe once/year"  . Drug use: No    Home Medications Prior to Admission medications   Medication Sig Start Date End Date Taking? Authorizing Provider  acetaminophen (TYLENOL) 500 MG tablet Take 1,000 mg by mouth every 6 (six) hours as needed for moderate pain.    [provider]  allopurinol (ZYLOPRIM) 100 MG tablet Take 0.5 tablets (50 mg total) by mouth every other day. Patient taking differently: Take 50 mg by mouth daily.  09/15/19 09/14/20  Vicie Mutters, PA-C  aspirin 81 MG tablet Take 81 mg by mouth daily.    [provider]  atenolol (TENORMIN) 50 MG tablet Take 1 tablet (50 mg total) by mouth daily. Take 1 tablet Daily for BP 02/18/19   Unk Pinto, MD  cholecalciferol (VITAMIN D3) 25 MCG (1000 UNIT) tablet Take 1,000 Units by mouth daily. Per Nephrologist, start taking 1000 units, but patient taking 5000 units presently.    [provider]  fish oil-omega-3 fatty acids 1000 MG capsule Take 2 g by mouth at bedtime.     [provider]  Flaxseed, Linseed, (FLAX SEED OIL) 1000 MG CAPS Take 1 capsule by mouth 3 (three) times daily.    [provider]  furosemide (LASIX) 40 MG tablet Take 1 tablet    Daily    for Fluid Retention / Ankle Swelling 10/11/19   Unk Pinto, MD  glucose blood [facility] VERIO) test strip Check blood sugar 3 times daily-DX-E11.22 04/05/19   Unk Pinto, MD  insulin NPH-regular Human (NOVOLIN 70/30) (70-30) 100 UNIT/ML injection Takes 28 units qam and 24-26 units qpm 07/12/19   [name], NP  Lancets [facility] ULTRASOFT) lancets Insulin dependent check sugars 2-3 x a day- 3 month supply 09/15/19   Vicie Mutters, PA-C  lansoprazole (PREVACID) 30 MG capsule Take 1  capsule Daily to Prevent Heart burn & Indigestion 08/16/19   Unk Pinto, MD  levETIRAcetam (KEPPRA) 500 MG tablet Take 1 tablet 3 x /day after Meals to Prevent Seizures 01/12/19   Unk Pinto, MD  levothyroxine (SYNTHROID) 100 MCG tablet Take 1 tablet daily on an empty stomach with only water for 30 minutes & no Antacid meds, Calcium or Magnesium for 4 hours & avoid Biotin 08/16/19   Unk Pinto, MD  loratadine (CLARITIN) 10 MG tablet Take 10 mg by mouth daily.    [provider]  meclizine (ANTIVERT) 25 MG tablet TAKE ONE TABLET BY MOUTH  UP TO THREE TIMES PER DAY IF NEEDED FOR DIZZINESS/VERTIGO 07/26/19   Unk Pinto, MD  predniSONE (DELTASONE) 5 MG tablet 1 tablet daily for 5 days. 10/01/19   Vicie Mutters, PA-C  rOPINIRole (REQUIP) 1 MG tablet Take 1/2 to 1 tablet 3 x /day as needed for Restless Legs & Cramps 04/05/19   Unk Pinto, MD  vitamin B-12 (CYANOCOBALAMIN) 100 MCG tablet Take 500 mcg by mouth daily.     [provider]  vitamin C (ASCORBIC ACID) 500 MG tablet Take 1,000 mg by mouth 3 (three) times daily.     [provider]    Allergies    Ppd [tuberculin purified protein derivative]  Review of Systems   Review of Systems  Constitutional: Positive for fatigue. Negative for fever.  HENT: Negative for sore throat.   Eyes: Negative for visual disturbance.  Respiratory: Positive for shortness of breath. Negative for cough and hemoptysis.   Cardiovascular: Negative for chest pain.  Gastrointestinal: Negative for abdominal pain.  Genitourinary: Negative for dysuria.  Musculoskeletal: Positive for gait problem (broke r foot).  Skin: Negative for rash.  Neurological: Negative for headaches.    Physical Exam Updated Vital Signs BP (!) 124/49 (BP Location: Left Arm)   Pulse 76   Temp 98.2 F (36.8 C) (Oral)   Resp 18   SpO2 100%   Physical Exam Vitals and nursing note reviewed.  Constitutional:      General: She is not in  acute distress.    Appearance: Normal appearance. She is well-developed.  HENT:     Head: Normocephalic and atraumatic.  Eyes:     Conjunctiva/sclera: Conjunctivae normal.  Cardiovascular:     Rate and Rhythm: Normal rate and regular rhythm.     Pulses: Normal pulses.     Heart sounds: Murmur (SEM) heard.   Pulmonary:     Effort: Pulmonary effort is normal. No respiratory distress.     Breath sounds: Normal breath sounds.  Abdominal:     Palpations: Abdomen is soft.     Tenderness: There is no abdominal tenderness.  Musculoskeletal:        General: Signs of injury (r foot in boot) present. Normal range of motion.     Cervical back: Neck supple.  Skin:    General: Skin is warm and dry.     Capillary Refill: Capillary refill takes less than 2 seconds.  Neurological:     General: No focal deficit present.     Mental Status: She is alert.     ED Results / Procedures / Treatments   Labs (all labs ordered are listed, but only abnormal results are displayed) Labs Reviewed  COMPREHENSIVE METABOLIC PANEL - Abnormal; Notable for the following components:      Result Value   CO2 17 (*)    Glucose, Bld 170 (*)    BUN 54 (*)    Creatinine, Ser 2.23 (*)    Total Protein 6.2 (*)    AST 13 (*)    GFR calc non Af Amer 20 (*)    GFR calc Af Amer 23 (*)    All other components within normal limits  CBC - Abnormal; Notable for the following components:   RBC 2.55 (*)    Hemoglobin 6.5 (*)    HCT 24.9 (*)    MCH 25.5 (*)    MCHC 26.1 (*)    RDW 23.0 (*)    nRBC 0.9 (*)    All other components within normal limits  CBG MONITORING, ED - Abnormal; Notable for the following components:   Glucose-Capillary 66 (*)    All other components within normal limits  SARS CORONAVIRUS 2 BY RT PCR (HOSPITAL ORDER, [facility])  PROTIME-INR  CBC  FERRITIN  FOLATE  IRON AND TIBC  RETICULOCYTES  VITAMIN P29  BASIC METABOLIC PANEL  CBC  POC OCCULT BLOOD, ED  TYPE  AND SCREEN  ABO/RH  PREPARE RBC (CROSSMATCH)    EKG EKG Interpretation  Date/Time:  Tuesday November 02 2019 12:56:00 EDT Ventricular Rate:  73 PR Interval:    QRS Duration: 140 QT Interval:  420 QTC Calculation: 463 R Axis:   -102 Text Interpretation: Sinus rhythm RBBB and LAFB No significant change since prior 7/21 Confirmed by Aletta Edouard [id number]) on 11/02/2019 1:08:34 PM   Radiology No results found.  Procedures .Critical Care Performed by: Hayden Rasmussen, MD Authorized by: Hayden Rasmussen, MD   Critical care provider statement:    Critical care time (minutes):  45   Critical care time was exclusive of:  Separately billable procedures and treating other patients   Critical care was necessary to treat or prevent imminent or life-threatening deterioration of the following conditions:  Circulatory failure   Critical care was time spent personally by me on the following activities:  Discussions with consultants, evaluation of patient's response to treatment, examination of patient, ordering and performing treatments and interventions, ordering and review of laboratory studies, ordering and review of radiographic studies, pulse oximetry, re-evaluation of patient's condition, obtaining history from patient or surrogate, review of old charts and development of treatment plan with patient or surrogate   I assumed direction of critical care for this patient from another provider in my specialty: no     (including critical care time)  Medications Ordered in ED Medications  allopurinol (ZYLOPRIM) tablet 50 mg (has no administration in time range)  aspirin EC tablet 81 mg (has no administration in time range)  atenolol (TENORMIN) tablet 50 mg (has no administration in time range)  furosemide (LASIX) tablet 40 mg (has no administration in time range)  insulin aspart protamine- aspart (NOVOLOG MIX 70/30) injection 20 Units (has no administration in time range)  levothyroxine  (SYNTHROID) tablet 100 mcg (has no administration in time range)  pantoprazole (PROTONIX) EC tablet 20 mg (has no administration in time range)  levETIRAcetam (KEPPRA) tablet 500 mg (has no administration in time range)  rOPINIRole (REQUIP) tablet 1 mg (has no administration in time range)  loratadine (CLARITIN) tablet 10 mg (has no administration in time range)  enoxaparin (LOVENOX) injection 30 mg (has no administration in time range)  acetaminophen (TYLENOL) tablet 650 mg (has no administration in time range)    Or  acetaminophen (TYLENOL) suppository 650 mg (has no administration in time range)  ondansetron (ZOFRAN) tablet 4 mg (has no administration in time range)    Or  ondansetron (ZOFRAN) injection 4 mg (has no administration in time range)  insulin aspart (novoLOG) injection 0-9 Units (has no administration in time range)  0.9 %  sodium chloride infusion (0 mL/hr Intravenous Stopped 11/02/19 1452)    ED Course  I have reviewed the triage vital signs and the nursing notes.  Pertinent labs & imaging results that were available during my care of the patient were reviewed by me and considered in my medical decision making (see chart for details).  Clinical Course as of Nov 02 1718  Tue Nov 02, 2019  1304 Rectal exam  done with tech as chaperone.  Normal tone.  Sample sent for guaiac to lab.   [MB]  9604 Discussed with Dr. Irene Limbo, patient's oncologist.  He said she has had slow GI bleeds over the course of months and responded to iron infusions.  The fact that she had such a precipitous drop for a month makes him more worried that she is having an acute GI bleed even if it was not recently.  He is recommending that she get admitted to the hospital for further work-up.   [MB]  1406 Discussed with Dr. Lorin [facility] hospitalist who will evaluate the patient for admission.   [MB]    Clinical Course User Index [MB] Hayden Rasmussen, MD   MDM Rules/Calculators/A&P                           This patient complains of shortness of breath on exertion, fatigue, anemia; this involves an extensive number of treatment Options and is a complaint that carries with it a high risk of complications and Morbidity. The differential includes anemia of chronic disease, GI bleed, arrhythmia, metabolic derangement  I ordered, reviewed and interpreted labs, which included CBC with normal white blood cell count, low hemoglobin of 6.5 down from eleven 1 month ago, worsened CKD with creatinine of 2.23, low bicarb, elevated glucose, Covid testing negative, INR slightly abnormal at 1.2, fecal occult negative I ordered medication transfusion of packed red blood cell Previous records obtained and reviewed in epic including prior hematology visits with Dr. Irene Limbo for iron deficiency anemia I consulted Dr. Irene Limbo hematology and Dr. Lorin [facility] hospitalist and discussed lab and imaging findings  Critical Interventions: Transfusion of blood products for hemoglobin of 6.5 and symptomatic with shortness of breath  After the interventions stated above, I reevaluated the patient and found patient will need to be admitted to the hospital for transfusion of blood products and further work-up for cause of anemia.  She is agreeable to plan.   Final Clinical Impression(s) / ED Diagnoses Final diagnoses:  Symptomatic anemia  Chronic kidney disease, unspecified CKD stage    Rx / DC Orders ED Discharge Orders    None       Hayden Rasmussen, MD 11/02/19 1726

## 2019-11-02 NOTE — Progress Notes (Signed)
========================================================= -   Test results slightly outside the reference range are not unusual. If there is anything important, I will review this with you,  otherwise it is considered normal test values.  If you have further questions,  please do not hesitate to contact me at the office or via My Chart.  =========================================================  -  CBC shows Hgb has dropped from 11.0 gm% down to 6.2 gm%   - Must go to [facility] ER to have CBC repeated STAT for Blood Transfusion ======================================================== ========================================================  -  Kidney functions still Stage 4  (GFR 21)  =========================================================  -  Chol = 75 and LDL 34 -  Excellent   - Very low risk for Heart Attack  / Stroke ========================================================  -  A1c  is Normal but probably incorrect due to severe Anemia =======================================================  -  Vitamin D = 56 - Great  ==========================================================  -  Uric Acid is 14.8 - Very high risk for Gout ==========================================================  -  U/A very suspicious for UTI - [facility] can do culture on Urine as likely will need to be admitted ==========================================================

## 2019-11-02 NOTE — ED Triage Notes (Signed)
Pt arrives today after having a physical yesterday. Pt states that her blood level is low (6.2). Pt states that she has not had any blood in stool , has not had any emesis. Pt endorses some weakness.

## 2019-11-03 ENCOUNTER — Other Ambulatory Visit: Payer: Self-pay

## 2019-11-03 DIAGNOSIS — I10 Essential (primary) hypertension: Secondary | ICD-10-CM

## 2019-11-03 DIAGNOSIS — E1122 Type 2 diabetes mellitus with diabetic chronic kidney disease: Secondary | ICD-10-CM

## 2019-11-03 DIAGNOSIS — G4733 Obstructive sleep apnea (adult) (pediatric): Secondary | ICD-10-CM

## 2019-11-03 DIAGNOSIS — D649 Anemia, unspecified: Secondary | ICD-10-CM | POA: Diagnosis not present

## 2019-11-03 DIAGNOSIS — E669 Obesity, unspecified: Secondary | ICD-10-CM

## 2019-11-03 DIAGNOSIS — N184 Chronic kidney disease, stage 4 (severe): Secondary | ICD-10-CM

## 2019-11-03 DIAGNOSIS — Z9989 Dependence on other enabling machines and devices: Secondary | ICD-10-CM

## 2019-11-03 LAB — BASIC METABOLIC PANEL
Anion gap: 12 (ref 5–15)
BUN: 50 mg/dL — ABNORMAL HIGH (ref 8–23)
CO2: 19 mmol/L — ABNORMAL LOW (ref 22–32)
Calcium: 9.4 mg/dL (ref 8.9–10.3)
Chloride: 110 mmol/L (ref 98–111)
Creatinine, Ser: 1.81 mg/dL — ABNORMAL HIGH (ref 0.44–1.00)
GFR calc Af Amer: 29 mL/min — ABNORMAL LOW (ref >60)
GFR calc non Af Amer: 25 mL/min — ABNORMAL LOW (ref >60)
Glucose, Bld: 126 mg/dL — ABNORMAL HIGH (ref 70–99)
Potassium: 3.7 mmol/L (ref 3.5–5.1)
Sodium: 141 mmol/L (ref 135–145)

## 2019-11-03 LAB — CBC
HCT: 27.5 % — ABNORMAL LOW (ref 36.0–46.0)
Hemoglobin: 7.4 g/dL — ABNORMAL LOW (ref 12.0–15.0)
MCH: 25.1 pg — ABNORMAL LOW (ref 26.0–34.0)
MCHC: 26.9 g/dL — ABNORMAL LOW (ref 30.0–36.0)
MCV: 93.2 fL (ref 80.0–100.0)
Platelets: 303 10*3/uL (ref 150–400)
RBC: 2.95 MIL/uL — ABNORMAL LOW (ref 3.87–5.11)
RDW: 23.9 % — ABNORMAL HIGH (ref 11.5–15.5)
WBC: 5.4 10*3/uL (ref 4.0–10.5)
nRBC: 0.6 % — ABNORMAL HIGH (ref 0.0–0.2)

## 2019-11-03 LAB — GLUCOSE, CAPILLARY
Glucose-Capillary: 128 mg/dL — ABNORMAL HIGH (ref 70–99)
Glucose-Capillary: 142 mg/dL — ABNORMAL HIGH (ref 70–99)
Glucose-Capillary: 198 mg/dL — ABNORMAL HIGH (ref 70–99)
Glucose-Capillary: 107 mg/dL — ABNORMAL HIGH (ref 70–99)

## 2019-11-03 LAB — HEMOGLOBIN AND HEMATOCRIT, BLOOD
HCT: 31 % — ABNORMAL LOW (ref 36.0–46.0)
Hemoglobin: 9.1 g/dL — ABNORMAL LOW (ref 12.0–15.0)

## 2019-11-03 LAB — PREPARE RBC (CROSSMATCH)

## 2019-11-03 MED ORDER — FUROSEMIDE 10 MG/ML IJ SOLN
20.0000 mg | Freq: Once | INTRAMUSCULAR | Status: AC
  Administered 2019-11-03: 20 mg via INTRAVENOUS
  Filled 2019-11-03: qty 2

## 2019-11-03 MED ORDER — SODIUM CHLORIDE 0.9% IV SOLUTION: Freq: Once | INTRAVENOUS | Status: AC

## 2019-11-03 NOTE — Progress Notes (Signed)
Pt does not want to wear cpap while admitted.  Pt was encouraged to call should she change her mind.

## 2019-11-03 NOTE — Progress Notes (Signed)
Report received from [facility] RN/ED . Pt arrived from ED to [facility] and transferred to bed without difficulty. Education initiated on use of call bell.  Late entry

## 2019-11-03 NOTE — Progress Notes (Signed)
PROGRESS NOTE  [name] [name] LTJ:[id number] DOB: May 18, 1932 DOA: 11/02/2019 PCP: Unk Pinto, MD  HPI/Recap of past 63 hours: 86 year old female past medical history of morbid obesity, anemia, stage IV chronic kidney disease, diabetes and obesity plus hypertension and hypothyroidism who was sent over to the emergency room on 9/14 after lab work noted that she had significant anemia.  Emergency room, her hemoglobin was confirmed to be 6.5, was previously 11.  Patient was brought into the hospitalist service.  Patient has been having iron infusions starting back since March.  She had a recent bone marrow biopsy.  No active bleeding.  Given a unit of blood.  Today, no real complaints other than shortness of breath with activity which has been going on now for several weeks.  Interesting, iron studies are normal.  Patient's hemoglobin improved up to 7.2.  Assessment/Plan: Principal Problem:   Symptomatic anemia: Status post 1 unit packed red blood cells.  Given minimal improvement in the likelihood that she could drop again, have ordered a second unit.  Recheck hemoglobin after.  Plan for her to keep her outpatient follow-up with hematology on 10/4. Active Problems:   Hypothyroidism: Continue Synthroid    CKD stage 4 due to type 2 diabetes mellitus ([place]): At baseline.    Essential hypertension: Blood pressure stable.    Hyperlipidemia associated with type 2 diabetes mellitus (H[facility])   OSA on CPAP: Continue CPAP.    Epilepsy ([place]): Stable.    Obesity (BMI 30-39.9): Meets criteria BMI greater than 30.   Code Status: Partial code  Family Communication: Left message for family  Disposition Plan: Anticipate return back to assisted living tomorrow, 9/16   Consultants:  None  Procedures:  Transfusion of packed red blood cells on 9/14 and 9/15  Antimicrobials:  None  DVT prophylaxis: SCDs   Objective: Vitals:   11/03/19 1458 11/03/19 1529  BP: (!) 124/48 (!) 149/53    Pulse: 76 81  Resp: 18 18  Temp: 98.2 F (36.8 C) 97.8 F (36.6 C)  SpO2: 96% 96%    Intake/Output Summary (Last 24 hours) at 11/03/2019 1555 Last data filed at 11/03/2019 1458 Gross per 24 hour  Intake 1266.71 ml  Output 3100 ml  Net -1833.29 ml   Filed Weights   11/03/19 0051  Weight: 86.7 kg   Body mass index is 34.95 kg/m.  Exam:   General: Alert and oriented x3, no acute distress  HEENT: Normocephalic and atraumatic, mucous membranes slightly dry  Cardiovascular: Regular rate and rhythm, S1-S2  Respiratory: Clear to auscultation bilaterally  Abdomen: Soft, nontender, nondistended, positive bowel sounds  Musculoskeletal: No clubbing or cyanosis or edema  Skin: No skin breaks, tears or lesions  Psychiatry: Appropriate, no evidence of psychoses   Data Reviewed: CBC: Recent Labs  Lab 11/01/19 1039 11/02/19 1151 11/02/19 1834 11/03/19 0327  WBC 4.6 4.7 4.8 5.4  NEUTROABS 3,496  --   --   --   HGB 6.2* 6.5* 7.2* 7.4*  HCT 22.2* 24.9* 27.1* 27.5*  MCV 92.1 97.6 95.1 93.2  PLT 311 321 305 076   Basic Metabolic Panel: Recent Labs  Lab 11/01/19 1039 11/02/19 1151 11/03/19 0327  NA 142 142 141  K 4.3 4.7 3.7  CL 112* 110 110  CO2 20 17* 19*  GLUCOSE 128* 170* 126*  BUN 56* 54* 50*  CREATININE 2.14* 2.23* 1.81*  CALCIUM 9.2  9.2 9.2 9.4  MG 2.6*  --   --    GFR: Estimated Creatinine  Clearance: 23.6 mL/min (A) (by C-G formula based on SCr of 1.81 mg/dL (H)). Liver Function Tests: Recent Labs  Lab 11/01/19 1039 11/02/19 1151  AST 12 13*  ALT 8 11  ALKPHOS  --  50  BILITOT 0.6 0.8  PROT 6.2 6.2*  ALBUMIN  --  3.8   No results for input(s): LIPASE, AMYLASE in the last 168 hours. No results for input(s): AMMONIA in the last 168 hours. Coagulation Profile: Recent Labs  Lab 11/02/19 1310  INR 1.2   Cardiac Enzymes: No results for input(s): CKTOTAL, CKMB, CKMBINDEX, TROPONINI in the last 168 hours. BNP (last 3 results) No results for  input(s): PROBNP in the last 8760 hours. HbA1C: Recent Labs    11/01/19 1039  HGBA1C 4.4   CBG: Recent Labs  Lab 11/02/19 1651 11/02/19 1834 11/02/19 2055 11/03/19 0730 11/03/19 1142  GLUCAP 66* 153* 129* 128* 142*   Lipid Profile: Recent Labs    11/01/19 1039  CHOL 75  HDL 26*  LDLCALC 34  TRIG 66  CHOLHDL 2.9   Thyroid Function Tests: Recent Labs    11/01/19 1039  TSH 1.22   Anemia Panel: Recent Labs    11/02/19 1834  VITAMINB12 929*  FOLATE 11.7  FERRITIN 39  TIBC 290  IRON 52  RETICCTPCT 6.5*   Urine analysis:    Component Value Date/Time   COLORURINE YELLOW 11/01/2019 [place] 11/01/2019 1039   LABSPEC 1.010 11/01/2019 1039   PHURINE < OR = 5.0 11/01/2019 1039   GLUCOSEU NEGATIVE 11/01/2019 1039   HGBUR NEGATIVE 11/01/2019 1039   [place] 09/04/2019 1958   KETONESUR NEGATIVE 11/01/2019 1039   PROTEINUR NEGATIVE 11/01/2019 1039   UROBILINOGEN 0.2 06/06/2014 0940   NITRITE NEGATIVE 11/01/2019 1039   LEUKOCYTESUR 2+ (A) 11/01/2019 1039   Sepsis Labs: '@LABRCNTIP' (procalcitonin:4,lacticidven:4)  ) Recent Results (from the past 240 hour(s))  SARS Coronavirus 2 by RT PCR (hospital order, performed in [place] hospital lab) Nasopharyngeal Nasopharyngeal Swab     Status: None   Collection Time: 11/02/19  1:10 PM   Specimen: Nasopharyngeal Swab  Result Value Ref Range Status   SARS Coronavirus 2 NEGATIVE NEGATIVE Final    Comment: (NOTE) SARS-CoV-2 target nucleic acids are NOT DETECTED.  The SARS-CoV-2 RNA is generally detectable in upper and lower respiratory specimens during the acute phase of infection. The lowest concentration of SARS-CoV-2 viral copies this assay can detect is 250 copies / mL. A negative result does not preclude SARS-CoV-2 infection and should not be used as the sole basis for treatment or other patient management decisions.  A negative result may occur with improper specimen collection /  handling, submission of specimen other than nasopharyngeal swab, presence of viral mutation(s) within the areas targeted by this assay, and inadequate number of viral copies (<250 copies / mL). A negative result must be combined with clinical observations, patient history, and epidemiological information.  Fact Sheet for Patients:   StrictlyIdeas.no  Fact Sheet for Healthcare Providers: BankingDealers.co.za  This test is not yet approved or  cleared by the [place] FDA and has been authorized for detection and/or diagnosis of SARS-CoV-2 by FDA under an Emergency Use Authorization (EUA).  This EUA will remain in effect (meaning this test can be used) for the duration of the COVID-19 declaration under Section 564(b)(1) of the Act, 21 U.S.C. section 360bbb-3(b)(1), unless the authorization is terminated or revoked sooner.  Performed at [facility], [address] [place], [place]  [address]       Studies: No results found.  Scheduled Meds: . allopurinol  50 mg Oral Daily  . aspirin EC  81 mg Oral Daily  . atenolol  50 mg Oral Daily  . enoxaparin (LOVENOX) injection  30 mg Subcutaneous Q24H  . furosemide  40 mg Oral Daily  . insulin aspart  0-9 Units Subcutaneous TID WC  . insulin aspart protamine- aspart  20 Units Subcutaneous BID WC  . levETIRAcetam  500 mg Oral TID PC  . levothyroxine  100 mcg Oral Q0600  . loratadine  10 mg Oral Daily  . pantoprazole  20 mg Oral Daily    Continuous Infusions:   LOS: 0 days     Annita Brod, MD [facility]   11/03/2019, 3:55 PM

## 2019-11-04 DIAGNOSIS — I10 Essential (primary) hypertension: Secondary | ICD-10-CM | POA: Diagnosis not present

## 2019-11-04 DIAGNOSIS — E1122 Type 2 diabetes mellitus with diabetic chronic kidney disease: Secondary | ICD-10-CM | POA: Diagnosis not present

## 2019-11-04 DIAGNOSIS — D649 Anemia, unspecified: Secondary | ICD-10-CM | POA: Diagnosis not present

## 2019-11-04 DIAGNOSIS — E669 Obesity, unspecified: Secondary | ICD-10-CM | POA: Diagnosis not present

## 2019-11-04 LAB — COMPLETE METABOLIC PANEL WITH GFR
AG Ratio: 2.1 (calc) (ref 1.0–2.5)
ALT: 8 U/L (ref 6–29)
AST: 12 U/L (ref 10–35)
Albumin: 4.2 g/dL (ref 3.6–5.1)
Alkaline phosphatase (APISO): 53 U/L (ref 37–153)
BUN/Creatinine Ratio: 26 (calc) — ABNORMAL HIGH (ref 6–22)
BUN: 56 mg/dL — ABNORMAL HIGH (ref 7–25)
CO2: 20 mmol/L (ref 20–32)
Calcium: 9.2 mg/dL (ref 8.6–10.4)
Chloride: 112 mmol/L — ABNORMAL HIGH (ref 98–110)
Creat: 2.14 mg/dL — ABNORMAL HIGH (ref 0.60–0.88)
GFR, Est African American: 24 mL/min/{1.73_m2} — ABNORMAL LOW (ref >=60)
GFR, Est Non African American: 21 mL/min/{1.73_m2} — ABNORMAL LOW (ref >=60)
Globulin: 2 g/dL (calc) (ref 1.9–3.7)
Glucose, Bld: 128 mg/dL — ABNORMAL HIGH (ref 65–99)
Potassium: 4.3 mmol/L (ref 3.5–5.3)
Sodium: 142 mmol/L (ref 135–146)
Total Bilirubin: 0.6 mg/dL (ref 0.2–1.2)
Total Protein: 6.2 g/dL (ref 6.1–8.1)

## 2019-11-04 LAB — BPAM RBC
Blood Product Expiration Date: 202109252359
Blood Product Expiration Date: 202110062359
ISSUE DATE / TIME: 202109141445
ISSUE DATE / TIME: 202109151504
Unit Type and Rh: 6200
Unit Type and Rh: 6200

## 2019-11-04 LAB — CBC WITH DIFFERENTIAL/PLATELET
Absolute Monocytes: 308 cells/uL (ref 200–950)
Basophils Absolute: 9 cells/uL (ref 0–200)
Basophils Relative: 0.2 %
Eosinophils Absolute: 239 cells/uL (ref 15–500)
Eosinophils Relative: 5.2 %
HCT: 22.2 % — ABNORMAL LOW (ref 35.0–45.0)
Hemoglobin: 6.2 g/dL — ABNORMAL LOW (ref 11.7–15.5)
Lymphs Abs: 547 cells/uL — ABNORMAL LOW (ref 850–3900)
MCH: 25.7 pg — ABNORMAL LOW (ref 27.0–33.0)
MCHC: 27.9 g/dL — ABNORMAL LOW (ref 32.0–36.0)
MCV: 92.1 fL (ref 80.0–100.0)
MPV: 10.7 fL (ref 7.5–12.5)
Monocytes Relative: 6.7 %
Neutro Abs: 3496 cells/uL (ref 1500–7800)
Neutrophils Relative %: 76 %
Platelets: 311 10*3/uL (ref 140–400)
RBC: 2.41 10*6/uL — ABNORMAL LOW (ref 3.80–5.10)
RDW: 20.5 % — ABNORMAL HIGH (ref 11.0–15.0)
Total Lymphocyte: 11.9 %
WBC: 4.6 10*3/uL (ref 3.8–10.8)

## 2019-11-04 LAB — TYPE AND SCREEN
ABO/RH(D): AB POS
Antibody Screen: NEGATIVE
Unit division: 0
Unit division: 0

## 2019-11-04 LAB — URINALYSIS, ROUTINE W REFLEX MICROSCOPIC
Bacteria, UA: NONE SEEN /HPF
Bilirubin Urine: NEGATIVE
Glucose, UA: NEGATIVE
Hgb urine dipstick: NEGATIVE
Hyaline Cast: NONE SEEN /LPF
Ketones, ur: NEGATIVE
Nitrite: NEGATIVE
Protein, ur: NEGATIVE
RBC / HPF: NONE SEEN /HPF (ref 0–2)
Specific Gravity, Urine: 1.01 (ref 1.001–1.03)
Squamous Epithelial / HPF: NONE SEEN /HPF (ref <=5)
pH: <=5 (ref 5.0–8.0)

## 2019-11-04 LAB — URIC ACID: Uric Acid, Serum: 14.8 mg/dL — ABNORMAL HIGH (ref 2.5–7.0)

## 2019-11-04 LAB — VITAMIN D 25 HYDROXY (VIT D DEFICIENCY, FRACTURES): Vit D, 25-Hydroxy: 56 ng/mL (ref 30–100)

## 2019-11-04 LAB — LIPID PANEL
Cholesterol: 75 mg/dL (ref <200)
HDL: 26 mg/dL — ABNORMAL LOW (ref >=50)
LDL Cholesterol (Calc): 34 mg/dL (calc)
Non-HDL Cholesterol (Calc): 49 mg/dL (calc) (ref <130)
Total CHOL/HDL Ratio: 2.9 (calc) (ref <5.0)
Triglycerides: 66 mg/dL (ref <150)

## 2019-11-04 LAB — MICROALBUMIN / CREATININE URINE RATIO
Creatinine, Urine: 35 mg/dL (ref 20–275)
Microalb Creat Ratio: 37 mcg/mg creat — ABNORMAL HIGH (ref <30)
Microalb, Ur: 1.3 mg/dL

## 2019-11-04 LAB — GLUCOSE, CAPILLARY
Glucose-Capillary: 138 mg/dL — ABNORMAL HIGH (ref 70–99)
Glucose-Capillary: 138 mg/dL — ABNORMAL HIGH (ref 70–99)

## 2019-11-04 LAB — LEVETIRACETAM LEVEL: Keppra (Levetiracetam): 48.9 ug/mL — ABNORMAL HIGH (ref 12.0–46.0)

## 2019-11-04 LAB — HEMOGLOBIN A1C
Hgb A1c MFr Bld: 4.4 % of total Hgb (ref <5.7)
Mean Plasma Glucose: 80 (calc)
eAG (mmol/L): 4.4 (calc)

## 2019-11-04 LAB — PTH, INTACT AND CALCIUM
Calcium: 9.2 mg/dL (ref 8.6–10.4)
PTH: 107 pg/mL — ABNORMAL HIGH (ref 14–64)

## 2019-11-04 LAB — CBC MORPHOLOGY

## 2019-11-04 LAB — MAGNESIUM: Magnesium: 2.6 mg/dL — ABNORMAL HIGH (ref 1.5–2.5)

## 2019-11-04 LAB — TSH: TSH: 1.22 mIU/L (ref 0.40–4.50)

## 2019-11-04 MED ORDER — HUMULIN 70/30 (70-30) 100 UNIT/ML ~~LOC~~ SUSP: 20.0000 [IU] | Freq: Two times a day (BID) | SUBCUTANEOUS | 3 refills | Status: DC

## 2019-11-04 NOTE — Discharge Summary (Signed)
Discharge Summary  [name] [name]:[id number] DOB: 26-May-1932  PCP: Unk Pinto, MD  Admit date: 11/02/2019 Discharge date: 11/04/2019  Time spent: 25 minutes  Recommendations for Outpatient Follow-up:  1. Medication change: Insulin 70/30 decreased from 28 units in the morning and 24 units in the evening to 20 units twice a day  Discharge Diagnoses:  Active Hospital Problems   Diagnosis Date Noted  . Symptomatic anemia 11/02/2019  . Obesity (BMI 30-39.9)   . OSA on CPAP 08/08/2014  . Hyperlipidemia associated with type 2 diabetes mellitus ([place]) 06/27/2013  . Epilepsy ([place]) 11/04/2012  . CKD stage 4 due to type 2 diabetes mellitus ([place]) 06/09/2008  . Essential hypertension 06/09/2008  . Hypothyroidism 06/09/2008    Resolved Hospital Problems  No resolved problems to display.    Discharge Condition: Improved, being discharged home  Diet recommendation: Home health at patient's assisted living  Vitals:   11/04/19 0147 11/04/19 0545  BP: (!) 131/48 132/76  Pulse: 77 76  Resp: 16 17  Temp: 98.2 F (36.8 C) (!) 97.3 F (36.3 C)  SpO2: 93% 94%    History of present illness:  86 year old female past medical history of morbid obesity, anemia, stage IV chronic kidney disease, diabetes and obesity plus hypertension and hypothyroidism who was sent over to the emergency room on 9/14 after lab work noted that she had significant anemia.  Emergency room, her hemoglobin was confirmed to be 6.5, was previously 11.  Patient was brought into the hospitalist service.   Hospital Course:    Symptomatic anemia: Patient has been having iron infusions starting back since March.  She had a recent bone marrow biopsy.  No active bleeding.  Status post 1 unit packed red blood cells.    By following day, minimally improved up to 7.1 with concern that she can drop again, patient received another unit of blood.  Interestingly, her iron panel noted a normal ferritin and iron, so no  additional Feraheme was given.  Multiple Hemoccults have all been negative. Plan for her to keep her outpatient follow-up with hematology on 10/4. Active Problems:   Hypothyroidism: Continue Synthroid    CKD stage 4 due to type 2 diabetes mellitus ([place]): At baseline in regards to renal function.  A1c checked on 9/13 and patient was found to have an A1c of 4.4, implying that she likely has frequent episodes of hypoglycemia.  Patient's insulin during her hospital stay was changed to 20 units twice a day and her CBGs have been stable ranging from 140-180.    Essential hypertension: Blood pressure stable.    Hyperlipidemia associated with type 2 diabetes mellitus (HCC)   OSA on CPAP: Continue CPAP.    Epilepsy ([place]): Stable.    Obesity (BMI 30-39.9): Meets criteria BMI greater than 30.  Consultants:  None  Procedures:  Transfusion of packed red blood cells on 9/14 and 9/15  Discharge Exam: BP 132/76 (BP Location: Left Arm)   Pulse 76   Temp (!) 97.3 F (36.3 C) (Oral)   Resp 17   Ht '5\' 2"'  (1.575 m)   Wt 86.7 kg   SpO2 94%   BMI 34.95 kg/m   General: Alert and oriented x3, no acute distress Cardiovascular: Regular rate and rhythm, S1-S2, 2 out of 6 holosystolic murmur Respiratory: Clear to auscultation bilaterally  Discharge Instructions You were cared for by a hospitalist during your hospital stay. If you have any questions about your discharge medications or the care you received while you were  in the hospital after you are discharged, you can call the unit and asked to speak with the hospitalist on call if the hospitalist that took care of you is not available. Once you are discharged, your primary care physician will handle any further medical issues. Please note that NO REFILLS for any discharge medications will be authorized once you are discharged, as it is imperative that you return to your primary care physician (or establish a relationship with a primary care  physician if you do not have one) for your aftercare needs so that they can reassess your need for medications and monitor your lab values.   Allergies as of 11/04/2019      Reactions   Ppd [tuberculin Purified Protein Derivative] Other (See Comments)   indurated      Medication List    TAKE these medications   acetaminophen 500 MG tablet Commonly known as: TYLENOL Take 1,000 mg by mouth every 6 (six) hours as needed for moderate pain.   allopurinol 100 MG tablet Commonly known as: Zyloprim Take 0.5 tablets (50 mg total) by mouth every other day. What changed: when to take this   aspirin 81 MG tablet Take 81 mg by mouth daily.   atenolol 50 MG tablet Commonly known as: TENORMIN Take 1 tablet (50 mg total) by mouth daily. Take 1 tablet Daily for BP What changed:   when to take this  additional instructions   cholecalciferol 25 MCG (1000 UNIT) tablet Commonly known as: VITAMIN D3 Take 1,000 Units by mouth daily. Per Nephrologist, start taking 1000 units, but patient taking 5000 units presently.   fish oil-omega-3 fatty acids 1000 MG capsule Take 2 g by mouth at bedtime.   Flax Seed Oil 1000 MG Caps Take 1 capsule by mouth 3 (three) times daily.   furosemide 40 MG tablet Commonly known as: LASIX Take 1 tablet    Daily    for Fluid Retention / Ankle Swelling What changed:   how much to take  how to take this  when to take this  additional instructions   HumuLIN 70/30 (70-30) 100 UNIT/ML injection Generic drug: insulin NPH-regular Human Inject 20 Units into the skin 2 (two) times daily with a meal. What changed:   how much to take  how to take this  when to take this  additional instructions   lansoprazole 30 MG capsule Commonly known as: PREVACID Take 1 capsule Daily to Prevent Heart burn & Indigestion What changed:   how much to take  how to take this  when to take this  additional instructions   levETIRAcetam 500 MG tablet Commonly known  as: KEPPRA Take 1 tablet 3 x /day after Meals to Prevent Seizures What changed:   how much to take  how to take this  when to take this  additional instructions   levothyroxine 100 MCG tablet Commonly known as: SYNTHROID Take 1 tablet daily on an empty stomach with only water for 30 minutes & no Antacid meds, Calcium or Magnesium for 4 hours & avoid Biotin What changed:   how much to take  how to take this  when to take this   loratadine 10 MG tablet Commonly known as: CLARITIN Take 10 mg by mouth daily.   meclizine 25 MG tablet Commonly known as: ANTIVERT TAKE ONE TABLET BY MOUTH UP TO THREE TIMES PER DAY IF NEEDED FOR DIZZINESS/VERTIGO What changed:   how much to take  how to take this  when to take  this  reasons to take this  additional instructions   onetouch ultrasoft lancets Insulin dependent check sugars 2-3 x a day- 3 month supply   OneTouch Verio test strip Generic drug: glucose blood Check blood sugar 3 times daily-DX-E11.22   rOPINIRole 1 MG tablet Commonly known as: Requip Take 1/2 to 1 tablet 3 x /day as needed for Restless Legs & Cramps What changed:   how much to take  how to take this  when to take this  additional instructions   vitamin B-12 100 MCG tablet Commonly known as: CYANOCOBALAMIN Take 100 mcg by mouth daily.   vitamin C 500 MG tablet Commonly known as: ASCORBIC ACID Take 1,000 mg by mouth 3 (three) times daily.      Allergies  Allergen Reactions  . Ppd [Tuberculin Purified Protein Derivative] Other (See Comments)    indurated    Follow-up Information    Unk Pinto, MD Follow up in 1 month(s).   Specialty: Internal Medicine Contact information: [address] [place] [address] [phone]                The results of significant diagnostics from this hospitalization (including imaging, microbiology, ancillary and laboratory) are listed below for reference.    Significant  Diagnostic Studies: XR Foot 2 Views Left  Result Date: 10/05/2019 3 views of her left foot demonstrate hallux valgus deformity.  She has both insertional calcaneal findings with with spurring as well as a spur at the bottom of her calcaneus consistent with chronic plantar fasciitis  XR Foot 2 Views Right  Result Date: 10/05/2019 X-ray of her foot today on lateral demonstrates a posterior facet intra-articular calcaneus fracture with some superior displacement.  This appears to be subacute.  XR Os Calcis Right  Result Date: 10/19/2019 2 view radiographs of the right calcaneus shows a displacement of the tongue type fracture that was initially 10 mm it is currently 11 mm. The total height of the calcaneus has changed from 58 mm to 60 mm.  XR Os Calcis Right  Result Date: 10/12/2019 2 views of her calcaneus today demonstrate some slight increase in the space of the fracture.  No other acute osseous changes x-rays reviewed by Dr. Sharol Given  XR Os Calcis Right  Result Date: 10/07/2019 2 view radiographs of the right calcaneus shows a tongue type fracture.  There is 9 mm of displacement previous radiographs showed 7 mm of displacement.   Microbiology: Recent Results (from the past 240 hour(s))  SARS Coronavirus 2 by RT PCR (hospital order, performed in [facility] hospital lab) Nasopharyngeal Nasopharyngeal Swab     Status: None   Collection Time: 11/02/19  1:10 PM   Specimen: Nasopharyngeal Swab  Result Value Ref Range Status   SARS Coronavirus 2 NEGATIVE NEGATIVE Final    Comment: (NOTE) SARS-CoV-2 target nucleic acids are NOT DETECTED.  The SARS-CoV-2 RNA is generally detectable in upper and lower respiratory specimens during the acute phase of infection. The lowest concentration of SARS-CoV-2 viral copies this assay can detect is 250 copies / mL. A negative result does not preclude SARS-CoV-2 infection and should not be used as the sole basis for treatment or other patient management  decisions.  A negative result may occur with improper specimen collection / handling, submission of specimen other than nasopharyngeal swab, presence of viral mutation(s) within the areas targeted by this assay, and inadequate number of viral copies (<250 copies / mL). A negative result must be combined with clinical observations, patient  history, and epidemiological information.  Fact Sheet for Patients:   StrictlyIdeas.no  Fact Sheet for Healthcare Providers: BankingDealers.co.za  This test is not yet approved or  cleared by the [place] FDA and has been authorized for detection and/or diagnosis of SARS-CoV-2 by FDA under an Emergency Use Authorization (EUA).  This EUA will remain in effect (meaning this test can be used) for the duration of the COVID-19 declaration under Section 564(b)(1) of the Act, 21 U.S.C. section 360bbb-3(b)(1), unless the authorization is terminated or revoked sooner.  Performed at [facility], [place] [address], [place], [place] [address]      Labs: Basic Metabolic Panel: Recent Labs  Lab 11/01/19 1039 11/02/19 1151 11/03/19 0327  NA 142 142 141  K 4.3 4.7 3.7  CL 112* 110 110  CO2 20 17* 19*  GLUCOSE 128* 170* 126*  BUN 56* 54* 50*  CREATININE 2.14* 2.23* 1.81*  CALCIUM 9.2  9.2 9.2 9.4  MG 2.6*  --   --    Liver Function Tests: Recent Labs  Lab 11/01/19 1039 11/02/19 1151  AST 12 13*  ALT 8 11  ALKPHOS  --  50  BILITOT 0.6 0.8  PROT 6.2 6.2*  ALBUMIN  --  3.8   No results for input(s): LIPASE, AMYLASE in the last 168 hours. No results for input(s): AMMONIA in the last 168 hours. CBC: Recent Labs  Lab 11/01/19 1039 11/02/19 1151 11/02/19 1834 11/03/19 0327 11/03/19 2142  WBC 4.6 4.7 4.8 5.4  --   NEUTROABS 3,496  --   --   --   --   HGB 6.2* 6.5* 7.2* 7.4* 9.1*  HCT 22.2* 24.9* 27.1* 27.5* 31.0*  MCV 92.1 97.6 95.1 93.2  --   PLT 311 321 305 303  --      Cardiac Enzymes: No results for input(s): CKTOTAL, CKMB, CKMBINDEX, TROPONINI in the last 168 hours. BNP: BNP (last 3 results) No results for input(s): BNP in the last 8760 hours.  ProBNP (last 3 results) No results for input(s): PROBNP in the last 8760 hours.  CBG: Recent Labs  Lab 11/03/19 0730 11/03/19 1142 11/03/19 1637 11/03/19 2218 11/04/19 0806  GLUCAP 128* 142* 198* 107* 138*       Signed:  Annita Brod, MD [facility] 11/04/2019, 10:43 AM

## 2019-11-04 NOTE — TOC Initial Note (Addendum)
Transition of Care [facility]) - Initial/Assessment Note    Patient Details  Name: [name] [name] MRN: [id number] Date of Birth: 08/27/1932  [facility]) CM/SW Contact:    Lia Hopping, [place] Phone Number: 11/04/2019, 8:41 AM  Clinical Narrative:                 CSW met with the patient at bedside to discuss d/c plan. Patient reports she lives at [address] apartments. Patient is fairly independent with ADL's but has a Chartered certified accountant every two weeks. She reports her son will assist with taking out the garbage or household chores as well. The patient ambulates with a rolling walker. She also has a rolling walker with a seat and a cane. The patient was active with PT/OT services through [facility]. Patient gave CSW permission to contact home health service PT Phineas Semen [phone]. Ms. Meredith Pel reports the agency will need resumption orders for PT/OT.  CSW inquired about additional DME needs. Patient declines needing additional DME.   Patient can benefit from a PT order.   Expected Discharge Plan: [place] Barriers to Discharge: Continued Medical Work up   Patient Goals and CMS Choice Patient states their goals for this hospitalization and ongoing recovery are:: return to [place] Medicare.gov Compare Post Acute Care list provided to:: Patient Choice offered to / list presented to : Patient  Expected Discharge Plan and Services Expected Discharge Plan: [place] In-house Referral: Clinical Social Work   Post Acute Care Choice: Durable Medical Equipment, Home Health Living arrangements for the past 2 months: [place]                 DME Arranged: N/A DME Agency: NA       HH Arranged: PT, OT [place] Agency:  (Other:[facility] ([place] Kristine Linea [phone]) Date [place]: 11/03/19 Time [place]: 1040 Representative spoke  with at [place]: Phineas Semen  Prior Living Arrangements/Services Living arrangements for the past 2 months: [place] Lives with:: Self Patient language and need for interpreter reviewed:: No Do you feel safe going back to the place where you live?: Yes      Need for Family Participation in Patient Care: Yes (Comment) Care giver support system in place?: Yes (comment) Current home services: DME, Home PT, Home OT, Housekeeping Criminal Activity/Legal Involvement Pertinent to Current Situation/Hospitalization: No - Comment as needed  Activities of Daily Living Home Assistive Devices/Equipment: CBG Meter, Built-in shower seat, CPAP, Eyeglasses, Grab bars around toilet, Grab bars in shower, Hand-held shower hose ADL Screening (condition at time of admission) Patient's cognitive ability adequate to safely complete daily activities?: Yes Is the patient deaf or have difficulty hearing?: No Does the patient have difficulty seeing, even when wearing glasses/contacts?: No Does the patient have difficulty concentrating, remembering, or making decisions?: No Patient able to express need for assistance with ADLs?: Yes Does the patient have difficulty dressing or bathing?: No Independently performs ADLs?: Yes (appropriate for developmental age) Does the patient have difficulty walking or climbing stairs?: Yes Weakness of Legs: Both Weakness of Arms/Hands: None  Permission Sought/Granted Permission sought to share information with : Facility Sport and exercise psychologist Permission granted to share information with : Yes, Verbal Permission Granted     Permission granted to share info w [facility]arly [place] Grout [phone]        Emotional Assessment Appearance:: Appears stated  age Attitude/Demeanor/Rapport: Engaged Affect (typically observed): Accepting, Calm Orientation: : Oriented to Self, Oriented to Place, Oriented to   Time, Oriented to Situation Alcohol / Substance Use: Not Applicable Psych Involvement: No (comment)  Admission diagnosis:  Symptomatic anemia [D64.9] Chronic kidney disease, unspecified CKD stage [N18.9] Patient Active Problem List   Diagnosis Date Noted  . Symptomatic anemia 11/02/2019  . DM neuropathy, type II diabetes mellitus ([place])   . Stage 4 chronic kidney disease ([place])   . Obesity (BMI 30-39.9)   . Hypertension   . High cholesterol   . Aortic valve thickening by Cardiac echo in 2015 11/01/2019  . Iron deficiency anemia 05/10/2019  . Secondary hyperparathyroidism of renal origin ([place]) 09/20/2018  . Type 2 diabetes mellitus with stage 4 chronic kidney disease, with long-term current use of insulin ([place]) 12/08/2017  . ACE inhibitor intolerance 12/08/2017  . Vertigo of central origin 11/20/2015  . Morbid obesity ([place]) BMI 35+ with T2DM, htn, hyperlipidemia, CKD, OSA 12/12/2014  . OSA on CPAP 08/08/2014  . Type II diabetes mellitus with neurological manifestations ([place]) 09/15/2013  . Hyperlipidemia associated with type 2 diabetes mellitus ([place]) 06/27/2013  . Vitamin D deficiency 06/27/2013  . Seizure disorder ([place]) 11/04/2012  . Epilepsy ([place]) 11/04/2012  . Anemia 06/13/2008  . COLONIC POLYPS 06/09/2008  . Hypothyroidism 06/09/2008  . CKD stage 4 due to type 2 diabetes mellitus ([place]) 06/09/2008  . Venous (peripheral) insufficiency 06/09/2008  . Gastroesophageal reflux disease 06/09/2008  . Essential hypertension 06/09/2008   PCP:  Unk Pinto, MD Pharmacy:   [facility] ([place]) [place], [place] [address] Phone: [phone] Fax: [phone]     Social Determinants of Health ([place]) Interventions    Readmission Risk Interventions No flowsheet data found.

## 2019-11-04 NOTE — Progress Notes (Signed)
=========================================  -   Keppra level -OK - Please keep dose same =========================================

## 2019-11-04 NOTE — TOC Transition Note (Addendum)
Transition of Care [facility]) - CM/SW Discharge Note   Patient Details  Name: LANASIA PORRAS MRN: [id number] Date of Birth: Jul 02, 1932  Transition of Care [facility]) CM/SW Contact:  Lia Hopping, [place] Phone Number: 11/04/2019, 11:25 AM   Clinical Narrative:    CSW faxed PT/OT orders to [phone]. CSW confirmed with agency Rep the orders received.      Barriers to Discharge: Barriers Resolved   Patient Goals and CMS Choice Patient states their goals for this hospitalization and ongoing recovery are:: return to [place] Medicare.gov Compare Post Acute Care list provided to:: Patient Choice offered to / list presented to : Patient  Discharge Placement                       Discharge Plan and Services In-house Referral: Clinical Social Work   Post Acute Care Choice: Durable Medical Equipment, Home Health          DME Arranged: N/A DME Agency: NA       HH Arranged: PT, OT HH Agency:  (Other:[facility] ([place] Kristine Linea [phone]) Date [place]: 11/03/19 Time [place]: 1040 Representative spoke with at [place]: [place] ([place]) Interventions     Readmission Risk Interventions No flowsheet data found.

## 2019-11-05 ENCOUNTER — Other Ambulatory Visit: Payer: Self-pay | Admitting: Internal Medicine

## 2019-11-05 DIAGNOSIS — E039 Hypothyroidism, unspecified: Secondary | ICD-10-CM

## 2019-11-09 ENCOUNTER — Ambulatory Visit: Payer: Self-pay

## 2019-11-09 ENCOUNTER — Ambulatory Visit: Payer: Medicare Other | Admitting: Physician Assistant

## 2019-11-09 ENCOUNTER — Telehealth: Payer: Self-pay | Admitting: *Deleted

## 2019-11-09 ENCOUNTER — Encounter: Payer: Self-pay | Admitting: Physician Assistant

## 2019-11-09 DIAGNOSIS — S92011D Displaced fracture of body of right calcaneus, subsequent encounter for fracture with routine healing: Secondary | ICD-10-CM

## 2019-11-09 NOTE — Telephone Encounter (Signed)
I have attempted to contact this patient by phone, but line is busy. I will continue to try later.

## 2019-11-09 NOTE — Progress Notes (Signed)
Office Visit Note   Patient: [name] [name]           Date of Birth: Jan 26, 1933           MRN: [id number] Visit Date: 11/09/2019              Requested by: Unk Pinto, [address] [place],  [place] [id number] PCP: Unk Pinto, MD  Chief Complaint  Patient presents with  . Right Foot - Follow-up      HPI: This is a pleasant woman who is now 6 or 7 weeks status post tongue fracture of her right Achilles.  She has been in a boot and weightbearing as tolerated she does not have any complaints she has been wearing compression stocks  Assessment & Plan: Visit Diagnoses:  1. Closed displaced fracture of body of right calcaneus with routine healing, subsequent encounter     Plan: Continue in boot for 2 more weeks return at that time x-rays of her calcaneus should be obtained  Follow-Up Instructions: No follow-ups on file.   Ortho Exam  Patient is alert, oriented, no adenopathy, well-dressed, normal affect, normal respiratory effort. Focused examination skin is in good condition in her posterior leg and calcaneus.  No cellulitis no skin breakdown no tenderness to palpation no swelling  Imaging: No results found. No images are attached to the encounter.  Labs: Lab Results  Component Value Date   HGBA1C 4.4 11/01/2019   HGBA1C 4.9 07/12/2019   HGBA1C 5.6 04/05/2019   ESRSEDRATE 9 09/29/2019   ESRSEDRATE 0 05/03/2019   ESRSEDRATE 1 05/02/2014   CRP 14.7 (H) 09/29/2019   LABURIC 14.8 (H) 11/01/2019   LABURIC 10.1 (H) 09/29/2019   LABURIC 11.9 (H) 08/03/2019   REPTSTATUS 09/07/2019 FINAL 09/04/2019   CULT >=100,000 COLONIES/mL ESCHERICHIA COLI (A) 09/04/2019   LABORGA ESCHERICHIA COLI (A) 09/04/2019     Lab Results  Component Value Date   ALBUMIN 3.8 11/02/2019   ALBUMIN 3.6 09/04/2019   ALBUMIN 3.9 08/25/2019   LABURIC 14.8 (H) 11/01/2019   LABURIC 10.1 (H) 09/29/2019   LABURIC 11.9 (H) 08/03/2019    Lab Results  Component  Value Date   MG 2.6 (H) 11/01/2019   MG 2.3 07/12/2019   MG 2.4 04/05/2019   Lab Results  Component Value Date   VD25OH 56 11/01/2019   VD25OH 39 04/05/2019   VD25OH 80 09/21/2018    No results found for: PREALBUMIN CBC EXTENDED Latest Ref Rng & Units 11/03/2019 11/03/2019 11/02/2019  WBC 4.0 - 10.5 K/uL - 5.4 4.8  RBC 3.87 - 5.11 MIL/uL - 2.95(L) 2.85(L)  HGB 12.0 - 15.0 g/dL 9.1(L) 7.4(L) 7.2(L)  HCT 36 - 46 % 31.0(L) 27.5(L) 27.1(L)  PLT 150 - 400 K/uL - 303 305  NEUTROABS 1,500 - 7,800 cells/uL - - -  LYMPHSABS 850 - 3,900 cells/uL - - -     There is no height or weight on file to calculate BMI.  Orders:  Orders Placed This Encounter  Procedures  . XR Os Calcis Right   No orders of the defined types were placed in this encounter.    Procedures: No procedures performed  Clinical Data: No additional findings.  ROS:  All other systems negative, except as noted in the HPI. Review of Systems  Objective: Vital Signs: There were no vitals taken for this visit.  Specialty Comments:  No specialty comments available.  PMFS History: Patient Active Problem List   Diagnosis Date Noted  .  Symptomatic anemia 11/02/2019  . DM neuropathy, type II diabetes mellitus ([place])   . Stage 4 chronic kidney disease ([place])   . Obesity (BMI 30-39.9)   . Hypertension   . High cholesterol   . Aortic valve thickening by Cardiac echo in 2015 11/01/2019  . Iron deficiency anemia 05/10/2019  . Secondary hyperparathyroidism of renal origin ([place]) 09/20/2018  . Type 2 diabetes mellitus with stage 4 chronic kidney disease, with long-term current use of insulin ([place]) 12/08/2017  . ACE inhibitor intolerance 12/08/2017  . Vertigo of central origin 11/20/2015  . Morbid obesity ([place]) BMI 35+ with T2DM, htn, hyperlipidemia, CKD, OSA 12/12/2014  . OSA on CPAP 08/08/2014  . Type II diabetes mellitus with neurological manifestations ([place]) 09/15/2013  . Hyperlipidemia associated with type 2  diabetes mellitus ([place]) 06/27/2013  . Vitamin D deficiency 06/27/2013  . Seizure disorder ([place]) 11/04/2012  . Epilepsy ([place]) 11/04/2012  . Anemia 06/13/2008  . COLONIC POLYPS 06/09/2008  . Hypothyroidism 06/09/2008  . CKD stage 4 due to type 2 diabetes mellitus ([place]) 06/09/2008  . Venous (peripheral) insufficiency 06/09/2008  . Gastroesophageal reflux disease 06/09/2008  . Essential hypertension 06/09/2008   Past Medical History:  Diagnosis Date  . Anemia, unspecified   . Arthritis    "knees; right shoulder" (09/15/2013)  . Basal cell carcinoma    "right upper outer lip"  . DM neuropathy, type II diabetes mellitus ([place])   . Dyspnea 2021   with low iron  . GERD (gastroesophageal reflux disease)   . Gout   . High cholesterol   . Hypertension   . Hypothyroidism   . Meniere's disease   . Obesity (BMI 30-39.9)   . Other forms of epilepsy and recurrent seizures without mention of intractable epilepsy 11/04/2012   Non convulsive paroxysmal spells, responding to [facility], patient not driving.   . Pneumonia ~ 1943  . Skin cancer    "forehead; right hand"  . Stage 4 chronic kidney disease ([place])   . UTI (urinary tract infection)   . Vitamin D deficiency     Family History  Problem Relation Age of Onset  . Heart disease Mother   . Stroke Mother   . Heart disease Father   . Ovarian cancer Sister   . Hypertension Son   . Hypertension Son   . Diabetes Son   . Alcohol abuse Son     Past Surgical History:  Procedure Laterality Date  . CERVICAL POLYPECTOMY    . HAMMER TOE SURGERY Left 1980's  . MOHS SURGERY  20087   "right upper outer lip"  . TONSILLECTOMY  ~ 1947  . WRIST SURGERY Left ~ 1950   "ran arm thru window"   Social History   Occupational History  . Occupation: retired  Tobacco Use  . Smoking status: Former Smoker    Packs/day: 1.00    Years: 30.00    Pack years: 30.00    Types: Cigarettes    Quit date: 06/29/1983    Years since quitting: 36.3  . Smokeless  tobacco: Never Used  Vaping Use  . Vaping Use: Never used  Substance and Sexual Activity  . Alcohol use: Yes    Alcohol/week: 0.0 standard drinks    Comment: 09/15/2013 "glass of wine maybe once/year"  . Drug use: No  . Sexual activity: Not Currently

## 2019-11-11 ENCOUNTER — Telehealth: Payer: Self-pay | Admitting: *Deleted

## 2019-11-11 NOTE — Telephone Encounter (Signed)
Called patient on 11/11/2019 , 8:54 AM in an attempt to reach the patient for a hospital follow up. Patient is in her home and home health is visiting her. Admit date: 11/02/19 Discharge: 11/04/19   She does not have any questions or concerns about medications from the hospital admission. The patient's medications were reviewed over the phone, they were counseled to bring in all current medications to the hospital follow up visit.   I advised the patient to call if any questions or concerns arise about the hospital admission or medications. The only medication change is her insulin dose.  Home health was started in the hospital. She is receiving physical therapy and OT. All questions were answered and a follow up appointment was made. She has an appointment with Dr Melford Aase on 11/17/2019.  Prior to Admission medications   Medication Sig Start Date End Date Taking? Authorizing Provider  acetaminophen (TYLENOL) 500 MG tablet Take 1,000 mg by mouth every 6 (six) hours as needed for moderate pain.    [provider]  allopurinol (ZYLOPRIM) 100 MG tablet Take 0.5 tablets (50 mg total) by mouth every other day. Patient taking differently: Take 50 mg by mouth daily.  09/15/19 09/14/20  Vicie Mutters, PA-C  aspirin 81 MG tablet Take 81 mg by mouth daily.    [provider]  atenolol (TENORMIN) 50 MG tablet Take 1 tablet (50 mg total) by mouth daily. Take 1 tablet Daily for BP Patient taking differently: Take 50 mg by mouth every evening.  02/18/19   Unk Pinto, MD  cholecalciferol (VITAMIN D3) 25 MCG (1000 UNIT) tablet Take 1,000 Units by mouth daily. Per Nephrologist, start taking 1000 units, but patient taking 5000 units presently.    [provider]  fish oil-omega-3 fatty acids 1000 MG capsule Take 2 g by mouth at bedtime.     [provider]  Flaxseed, Linseed, (FLAX SEED OIL) 1000 MG CAPS Take 1 capsule by mouth 3 (three) times daily.    [provider]  furosemide (LASIX) 40 MG tablet Take 1 tablet    Daily    for Fluid Retention / Ankle Swelling Patient taking differently: Take 40 mg by mouth daily.  10/11/19   Unk Pinto, MD  glucose blood [facility] VERIO) test strip Check blood sugar 3 times daily-DX-E11.22 04/05/19   Unk Pinto, MD  insulin NPH-regular Human (HUMULIN 70/30) (70-30) 100 UNIT/ML injection Inject 20 Units into the skin 2 (two) times daily with a meal. 11/04/19   Annita Brod, MD  Lancets [facility] ULTRASOFT) lancets Insulin dependent check sugars 2-3 x a day- 3 month supply 09/15/19   Vicie Mutters, PA-C  lansoprazole (PREVACID) 30 MG capsule Take 1 capsule Daily to Prevent Heart burn & Indigestion Patient taking differently: Take 30 mg by mouth every evening.  08/16/19   Unk Pinto, MD  levETIRAcetam (KEPPRA) 500 MG tablet Take 1 tablet 3 x /day after Meals to Prevent Seizures Patient taking differently: Take 500 mg by mouth in the morning, at noon, and at bedtime.  01/12/19   Unk Pinto, MD  levothyroxine (SYNTHROID) 100 MCG tablet Take 1 tablet daily on an empty stomach with only water for 30 minutes & no Antacid meds, Calcium or Magnesium for 4 hours & avoid Biotin 11/05/19   Unk Pinto, MD  loratadine (CLARITIN) 10 MG tablet Take 10 mg by mouth daily.    [provider]  meclizine (ANTIVERT) 25 MG tablet TAKE ONE TABLET BY MOUTH UP TO THREE  TIMES PER DAY IF NEEDED FOR DIZZINESS/VERTIGO Patient taking differently: Take 25 mg by mouth 3 (three) times daily as needed for dizziness.  07/26/19   Unk Pinto, MD  rOPINIRole (REQUIP) 1 MG tablet Take 1/2 to 1 tablet 3 x /day as needed for Restless Legs & Cramps Patient taking differently: Take 1 mg by mouth 3 (three) times daily.  04/05/19   Unk Pinto, MD  vitamin B-12 (CYANOCOBALAMIN) 100 MCG tablet Take 100 mcg by mouth daily.     [provider]  vitamin C (ASCORBIC ACID) 500 MG tablet Take 1,000 mg by mouth 3 (three)  times daily.     [provider]

## 2019-11-16 NOTE — Progress Notes (Signed)
[place]     This very nice 86 y.o. WWF  was admitted to the hospital on  11/02/2019  and patient was discharged from the hospital on  11/04/2019. The patient now presents for follow up for transition from recent hospitalization or SNIF stay.  The day after discharge  our clinical staff contacted the patient to assure stability and schedule a follow up appointment. The discharge summary, medications and diagnostic test results were reviewed before meeting with the patient. The patient was admitted for:   Symptomatic anemia Essential hypertension CKD stage 4 due to type 2 diabetes mellitus (HCC) Seizure disorder ([place]) Anemia associated with chronic renal failure Hypothyroidism OSA on CPAP      Hospitalization discharge instructions and medications are reconciled with the patient.       Patient had been seen recently on 11/01/2019 for OV and the following morning her CBC returned showing a severe anemia of Hgb 6.5 gm%  & patient was referred to the ER for evaluation & anticipated blood transfusion. Patient has hx/o T2_IDDM  / CKD4  and had been followed  by Dr Irene Limbo for Anemia of Renal Disease. In hospital, patient was transfused 2 units of pRBC. Pre hospitalization A1c was 4.4%  & felt artifactually low due to her severe anemia.  Iron studies returned Normal. Post transfusion Hgb rose to 9.1 gm% and as patient was not felt actively bleeding or hemolizing, she was felt stable for discharge & f/u as an out patient.          Patient is also followed with Hypertension, Hyperlipidemia, Pre-Diabetes and Vitamin D Deficiency.      Patient is treated for HTN & BP has been controlled at home. Today's BP: (!) 136/58. Patient has had no complaints of any cardiac type chest pain, palpitations, dyspnea/orthopnea/PND, dizziness, claudication, or dependent edema.     Hyperlipidemia is controlled with diet & meds. Patient denies myalgias or other med SE's. Last Lipids were  Lab Results    Component Value Date   CHOL 75 11/01/2019   HDL 26 (L) 11/01/2019   LDLCALC 34 11/01/2019   TRIG 66 11/01/2019   CHOLHDL 2.9 11/01/2019      Also, the patient has history of T2_NIDDM PreDiabetes and has had no symptoms of reactive hypoglycemia, diabetic polys, paresthesias or visual blurring.  Last A1c was  Lab Results  Component Value Date   HGBA1C 4.4 11/01/2019      Further, the patient also has history of Vitamin D Deficiency and supplements vitamin D without any suspected side-effects. Last vitamin D was   Lab Results  Component Value Date   VD25OH 56 11/01/2019   Current Outpatient Medications on File Prior to Visit  Medication Sig  . acetaminophen (TYLENOL) 500 MG tablet Take 1,000 mg by mouth every 6 (six) hours as needed for moderate pain.  Marland Kitchen allopurinol (ZYLOPRIM) 100 MG tablet Take 100 mg by mouth daily.  Marland Kitchen aspirin 81 MG tablet Take 81 mg by mouth daily.  Marland Kitchen atenolol (TENORMIN) 50 MG tablet Take 1 tablet (50 mg total) by mouth daily. Take 1 tablet Daily for BP (Patient taking differently: Take 50 mg by mouth every evening. )  . cholecalciferol (VITAMIN D3) 25 MCG (1000 UNIT) tablet Take 1,000 Units by mouth daily. Per Nephrologist, start taking 1000 units, but patient taking 5000 units presently.  . fish oil-omega-3 fatty acids 1000 MG capsule Take 2 g by mouth at bedtime.   . Flaxseed, Linseed, (FLAX SEED OIL)  1000 MG CAPS Take 1 capsule by mouth 3 (three) times daily.  . furosemide (LASIX) 40 MG tablet Take 1 tablet    Daily    for Fluid Retention / Ankle Swelling (Patient taking differently: Take 40 mg by mouth daily. )  . glucose blood (ONETOUCH VERIO) test strip Check blood sugar 3 times daily-DX-E11.22  . insulin NPH-regular Human (HUMULIN 70/30) (70-30) 100 UNIT/ML injection Inject 20 Units into the skin 2 (two) times daily with a meal.  . Lancets (ONETOUCH ULTRASOFT) lancets Insulin dependent check sugars 2-3 x a day- 3 month supply  . lansoprazole (PREVACID) 30 MG  capsule Take 1 capsule Daily to Prevent Heart burn & Indigestion (Patient taking differently: Take 30 mg by mouth every evening. )  . levETIRAcetam (KEPPRA) 500 MG tablet Take 1 tablet 3 x /day after Meals to Prevent Seizures (Patient taking differently: Take 500 mg by mouth in the morning, at noon, and at bedtime. )  . levothyroxine (SYNTHROID) 100 MCG tablet Take 1 tablet daily on an empty stomach with only water for 30 minutes & no Antacid meds, Calcium or Magnesium for 4 hours & avoid Biotin  . loratadine (CLARITIN) 10 MG tablet Take 10 mg by mouth daily.  . meclizine (ANTIVERT) 25 MG tablet TAKE ONE TABLET BY MOUTH UP TO THREE TIMES PER DAY IF NEEDED FOR DIZZINESS/VERTIGO (Patient taking differently: Take 25 mg by mouth 3 (three) times daily as needed for dizziness. )  . rOPINIRole (REQUIP) 1 MG tablet Take 1/2 to 1 tablet 3 x /day as needed for Restless Legs & Cramps (Patient taking differently: Take 1 mg by mouth 3 (three) times daily. )  . vitamin B-12 (CYANOCOBALAMIN) 100 MCG tablet Take 100 mcg by mouth daily.   . vitamin C (ASCORBIC ACID) 500 MG tablet Take 1,000 mg by mouth 3 (three) times daily.    No current facility-administered medications on file prior to visit.   Allergies  Allergen Reactions  . Ppd [Tuberculin Purified Protein Derivative] Other (See Comments)    indurated   PMHx:   Past Medical History:  Diagnosis Date  . Anemia, unspecified   . Arthritis    "knees; right shoulder" (09/15/2013)  . Basal cell carcinoma    "right upper outer lip"  . DM neuropathy, type II diabetes mellitus ([place])   . Dyspnea 2021   with low iron  . GERD (gastroesophageal reflux disease)   . Gout   . High cholesterol   . Hypertension   . Hypothyroidism   . Meniere's disease   . Obesity (BMI 30-39.9)   . Other forms of epilepsy and recurrent seizures without mention of intractable epilepsy 11/04/2012   Non convulsive paroxysmal spells, responding to [facility], patient not driving.   .  Pneumonia ~ 1943  . Skin cancer    "forehead; right hand"  . Stage 4 chronic kidney disease ([place])   . UTI (urinary tract infection)   . Vitamin D deficiency    Immunization History  Administered Date(s) Administered  . DTaP 03/18/2007  . Influenza, High Dose Seasonal PF 10/18/2013, 12/12/2014, 10/24/2015, 10/28/2016, 12/08/2017, 12/01/2018  . Influenza-Unspecified 11/18/2012  . PFIZER SARS-COV-2 Vaccination 03/10/2019, 03/31/2019  . Pneumococcal Conjugate-13 12/12/2014  . Pneumococcal Polysaccharide-23 09/16/2013  . Td 12/08/2017  . Zoster 02/28/2005   Past Surgical History:  Procedure Laterality Date  . CERVICAL POLYPECTOMY    . HAMMER TOE SURGERY Left 1980's  . MOHS SURGERY  20087   "right upper outer lip"  . TONSILLECTOMY  ~  51  . WRIST SURGERY Left ~ 1950   "ran arm thru window"   FHx:    Reviewed / unchanged  SHx:    Reviewed / unchanged  Systems Review:  Constitutional: Denies fever, chills, wt changes, headaches, insomnia, fatigue, night sweats, change in appetite. Eyes: Denies redness, blurred vision, diplopia, discharge, itchy, watery eyes.  ENT: Denies discharge, congestion, post nasal drip, epistaxis, sore throat, earache, hearing loss, dental pain, tinnitus, vertigo, sinus pain, snoring.  CV: Denies chest pain, palpitations, irregular heartbeat, syncope, dyspnea, diaphoresis, orthopnea, PND, claudication or edema. Respiratory: denies cough, dyspnea, DOE, pleurisy, hoarseness, laryngitis, wheezing.  Gastrointestinal: Denies dysphagia, odynophagia, heartburn, reflux, water brash, abdominal pain or cramps, nausea, vomiting, bloating, diarrhea, constipation, hematemesis, melena, hematochezia  or hemorrhoids. Genitourinary: Denies dysuria, frequency, urgency, nocturia, hesitancy, discharge, hematuria or flank pain. Musculoskeletal: Denies arthralgias, myalgias, stiffness, jt. swelling, pain, limping or strain/sprain.  Skin: Denies pruritus, rash, hives, warts, acne,  eczema or change in skin lesion(s). Neuro: No weakness, tremor, incoordination, spasms, paresthesia or pain. Psychiatric: Denies confusion, memory loss or sensory loss. Endo: Denies change in weight, skin or hair change.  Heme/Lymph: No excessive bleeding, bruising or enlarged lymph nodes.  Physical Exam  BP (!) 136/58   Pulse 68   Temp (!) 97 F (36.1 C)   Resp 16   Ht 5\' 2"  (1.575 m)   Wt 188 lb 3.2 oz (85.4 kg)   SpO2 96%   BMI 34.42 kg/m   Appears over nourished   and in no distress.  Eyes: PERRLA, EOMs, conjunctiva no swelling or erythema. Sinuses: No frontal/maxillary tenderness ENT/Mouth: EAC's clear, TM's nl w/o erythema, bulging. Nares clear w/o erythema, swelling, exudates. Oropharynx clear without erythema or exudates. Oral hygiene is good. Tongue normal, non obstructing. Hearing intact.  Neck: Supple. Thyroid nl. Car 2+/2+ without bruits, nodes or JVD. Chest: Respirations nl with BS clear & equal w/o rales, rhonchi, wheezing or stridor.  Cor: Heart sounds normal w/ regular rate and rhythm without sig. murmurs, gallops, clicks or rubs. Peripheral pulses normal and equal  without edema.  Abdomen: Soft, obese  & bowel sounds normal. Non-tender w/o guarding, rebound, hernias, masses or organomegaly.  Lymphatics: Unremarkable.  Musculoskeletal: Full ROM all peripheral extremities, joint stability, 5/5 strength and normal gait.  Skin: Warm, dry without exposed rashes, lesions or ecchymosis apparent.  Neuro: Cranial nerves intact, reflexes equal bilaterally. Sensory-motor testing grossly intact. Tendon reflexes grossly intact.  Pysch: Alert & oriented x 3.  Insight and judgement nl & appropriate. No ideations.  Assessment and Plan:  1. Symptomatic anemia  - CBC with Differential/Platelet  2. Essential hypertension  - Continue medication, monitor blood pressure at home.  - Continue DASH diet. Reminder to go to the ER if any CP,  SOB, nausea, dizziness, severe HA,  changes vision/speech.  3. CKD stage 4 due to type 2 diabetes mellitus ([place])   4. Seizure disorder ([place])   5. Anemia associated with chronic renal failure  - Reticulocytes - CBC with Differential/Platelet  6. Hypothyroidism   7. OSA on CPAP        Discussed  regular exercise, BP monitoring, weight control to achieve/maintain BMI less than 25 and discussed meds and SE's. Recommended labs to assess and monitor clinical status with further disposition pending results of labs. Over 30 minutes of exam, counseling, chart review was performed.

## 2019-11-17 ENCOUNTER — Encounter: Payer: Self-pay | Admitting: Internal Medicine

## 2019-11-17 ENCOUNTER — Other Ambulatory Visit: Payer: Self-pay | Admitting: *Deleted

## 2019-11-17 ENCOUNTER — Other Ambulatory Visit: Payer: Self-pay

## 2019-11-17 ENCOUNTER — Ambulatory Visit: Payer: Medicare Other | Admitting: Internal Medicine

## 2019-11-17 VITALS — BP 136/58 | HR 68 | Temp 97.0°F | Resp 16 | Ht 62.0 in | Wt 188.2 lb

## 2019-11-17 DIAGNOSIS — E1122 Type 2 diabetes mellitus with diabetic chronic kidney disease: Secondary | ICD-10-CM | POA: Diagnosis not present

## 2019-11-17 DIAGNOSIS — N184 Chronic kidney disease, stage 4 (severe): Secondary | ICD-10-CM

## 2019-11-17 DIAGNOSIS — I10 Essential (primary) hypertension: Secondary | ICD-10-CM

## 2019-11-17 DIAGNOSIS — Z9989 Dependence on other enabling machines and devices: Secondary | ICD-10-CM

## 2019-11-17 DIAGNOSIS — D649 Anemia, unspecified: Secondary | ICD-10-CM | POA: Diagnosis not present

## 2019-11-17 DIAGNOSIS — G40909 Epilepsy, unspecified, not intractable, without status epilepticus: Secondary | ICD-10-CM | POA: Diagnosis not present

## 2019-11-17 DIAGNOSIS — D631 Anemia in chronic kidney disease: Secondary | ICD-10-CM

## 2019-11-17 DIAGNOSIS — E039 Hypothyroidism, unspecified: Secondary | ICD-10-CM

## 2019-11-17 DIAGNOSIS — N189 Chronic kidney disease, unspecified: Secondary | ICD-10-CM

## 2019-11-17 DIAGNOSIS — G4733 Obstructive sleep apnea (adult) (pediatric): Secondary | ICD-10-CM

## 2019-11-17 MED ORDER — ALLOPURINOL 100 MG PO TABS: ORAL_TABLET | ORAL | 3 refills | Status: DC

## 2019-11-17 NOTE — Patient Instructions (Signed)

## 2019-11-18 LAB — CBC WITH DIFFERENTIAL/PLATELET
HCT: 37.9 % (ref 35.0–45.0)
Hemoglobin: 11.1 g/dL — ABNORMAL LOW (ref 11.7–15.5)
MCH: 25.9 pg — ABNORMAL LOW (ref 27.0–33.0)
MCHC: 29.3 g/dL — ABNORMAL LOW (ref 32.0–36.0)
MCV: 88.6 fL (ref 80.0–100.0)
MPV: 10.5 fL (ref 7.5–12.5)
Platelets: 457 10*3/uL — ABNORMAL HIGH (ref 140–400)
RBC: 4.28 10*6/uL (ref 3.80–5.10)
RDW: 18.2 % — ABNORMAL HIGH (ref 11.0–15.0)
WBC: 5.8 10*3/uL (ref 3.8–10.8)

## 2019-11-18 LAB — DIFF, MANUAL
ABSOLUTE BAND NEUTROPHILS: 58 cells/uL (ref 0–750)
Absolute Lymphocytes: 464 cells/uL — ABNORMAL LOW (ref 850–3900)
Absolute Monocytes: 232 cells/uL (ref 200–950)
Band Neutrophils: 1 %
Basophils Absolute: 232 cells/uL — ABNORMAL HIGH (ref 0–200)
Basophils Relative: 4 %
Eosinophils Absolute: 580 cells/uL — ABNORMAL HIGH (ref 15–500)
Eosinophils Relative: 10 %
Monocytes Relative: 4 %
Neutro Abs: 4234 cells/uL (ref 1500–7800)
Neutrophils Relative %: 73 %
Total Lymphocyte: 8 %

## 2019-11-18 LAB — RETICULOCYTES
ABS Retic: 85600 cells/uL — ABNORMAL HIGH (ref 20000–8000)
Retic Ct Pct: 2 %

## 2019-11-18 NOTE — Progress Notes (Signed)
========================================================== -   Test results slightly outside the reference range are not unusual. If there is anything important, I will review this with you,  otherwise it is considered normal test values.  If you have further questions,  please do not hesitate to contact me at the office or via My Chart.  ==========================================================  -  CBC - Much Much Better - Red cell count - Hgb isup from 6.5 to 11.1 gm%   & WBC is Normal & OK  ==========================================================  -  Reticulocyte count is high - which is great - shows Bone marrow is working [place] to make new Red Blood Cells ! ==========================================================

## 2019-11-19 ENCOUNTER — Ambulatory Visit: Payer: Medicare Other | Admitting: Internal Medicine

## 2019-11-23 ENCOUNTER — Encounter: Payer: Self-pay | Admitting: Physician Assistant

## 2019-11-23 ENCOUNTER — Ambulatory Visit: Payer: Self-pay

## 2019-11-23 ENCOUNTER — Ambulatory Visit: Payer: Medicare Other | Admitting: Physician Assistant

## 2019-11-23 ENCOUNTER — Other Ambulatory Visit: Payer: Self-pay | Admitting: Internal Medicine

## 2019-11-23 VITALS — Ht 62.0 in | Wt 188.0 lb

## 2019-11-23 DIAGNOSIS — K219 Gastro-esophageal reflux disease without esophagitis: Secondary | ICD-10-CM

## 2019-11-23 DIAGNOSIS — G40909 Epilepsy, unspecified, not intractable, without status epilepticus: Secondary | ICD-10-CM

## 2019-11-23 DIAGNOSIS — S92011D Displaced fracture of body of right calcaneus, subsequent encounter for fracture with routine healing: Secondary | ICD-10-CM
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
## 2019-11-26 ENCOUNTER — Other Ambulatory Visit: Payer: Self-pay

## 2019-11-26 ENCOUNTER — Inpatient Hospital Stay: Payer: Medicare Other | Attending: Hematology

## 2019-11-26 ENCOUNTER — Inpatient Hospital Stay: Payer: Medicare Other | Admitting: Hematology

## 2019-11-26 ENCOUNTER — Telehealth: Payer: Self-pay | Admitting: Hematology

## 2019-11-26 VITALS — BP 148/47 | HR 75 | Temp 97.5°F | Resp 17 | Ht 62.0 in | Wt 191.2 lb

## 2019-11-26 DIAGNOSIS — D509 Iron deficiency anemia, unspecified: Secondary | ICD-10-CM

## 2019-11-26 DIAGNOSIS — D75839 Thrombocytosis, unspecified: Secondary | ICD-10-CM

## 2019-11-26 DIAGNOSIS — D631 Anemia in chronic kidney disease: Secondary | ICD-10-CM

## 2019-11-26 DIAGNOSIS — N184 Chronic kidney disease, stage 4 (severe): Secondary | ICD-10-CM | POA: Diagnosis present

## 2019-11-26 LAB — CBC WITH DIFFERENTIAL/PLATELET
Abs Immature Granulocytes: 0.03 10*3/uL (ref 0.00–0.07)
Basophils Absolute: 0 10*3/uL (ref 0.0–0.1)
Basophils Relative: 1 %
Eosinophils Absolute: 1.4 10*3/uL — ABNORMAL HIGH (ref 0.0–0.5)
Eosinophils Relative: 18 %
HCT: 37.8 % (ref 36.0–46.0)
Hemoglobin: 11.1 g/dL — ABNORMAL LOW (ref 12.0–15.0)
Immature Granulocytes: 0 %
Lymphocytes Relative: 9 %
Lymphs Abs: 0.7 10*3/uL (ref 0.7–4.0)
MCH: 25.6 pg — ABNORMAL LOW (ref 26.0–34.0)
MCHC: 29.4 g/dL — ABNORMAL LOW (ref 30.0–36.0)
MCV: 87.3 fL (ref 80.0–100.0)
Monocytes Absolute: 0.6 10*3/uL (ref 0.1–1.0)
Monocytes Relative: 7 %
Neutro Abs: 5.2 10*3/uL (ref 1.7–7.7)
Neutrophils Relative %: 65 %
Platelets: 510 10*3/uL — ABNORMAL HIGH (ref 150–400)
RBC: 4.33 MIL/uL (ref 3.87–5.11)
RDW: 18.8 % — ABNORMAL HIGH (ref 11.5–15.5)
WBC: 8 10*3/uL (ref 4.0–10.5)
nRBC: 0 % (ref 0.0–0.2)

## 2019-11-26 LAB — FERRITIN: Ferritin: 55 ng/mL (ref 11–307)

## 2019-11-26 LAB — CMP (CANCER CENTER ONLY)
ALT: 12 U/L (ref 0–44)
AST: 16 U/L (ref 15–41)
Albumin: 3.9 g/dL (ref 3.5–5.0)
Alkaline Phosphatase: 79 U/L (ref 38–126)
Anion gap: 6 (ref 5–15)
BUN: 35 mg/dL — ABNORMAL HIGH (ref 8–23)
CO2: 27 mmol/L (ref 22–32)
Calcium: 9.9 mg/dL (ref 8.9–10.3)
Chloride: 108 mmol/L (ref 98–111)
Creatinine: 1.82 mg/dL — ABNORMAL HIGH (ref 0.44–1.00)
GFR, Estimated: 25 mL/min — ABNORMAL LOW (ref >60)
Glucose, Bld: 187 mg/dL — ABNORMAL HIGH (ref 70–99)
Potassium: 4.3 mmol/L (ref 3.5–5.1)
Sodium: 141 mmol/L (ref 135–145)
Total Bilirubin: 0.4 mg/dL (ref 0.3–1.2)
Total Protein: 6.9 g/dL (ref 6.5–8.1)

## 2019-11-26 LAB — IRON AND TIBC
Iron: 48 ug/dL (ref 41–142)
Saturation Ratios: 17 % — ABNORMAL LOW (ref 21–57)
TIBC: 280 ug/dL (ref 236–444)
UIBC: 232 ug/dL (ref 120–384)
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
## 2019-11-26 NOTE — Telephone Encounter (Signed)
Scheduled per 10/8 los. No avs or calendar needed to be printed. Pt cannot come in on tuesdays and thursdays.

## 2019-12-07 ENCOUNTER — Other Ambulatory Visit: Payer: Self-pay | Admitting: Hematology

## 2019-12-07 ENCOUNTER — Telehealth: Payer: Self-pay | Admitting: *Deleted

## 2019-12-07 ENCOUNTER — Telehealth: Payer: Self-pay | Admitting: Hematology

## 2019-12-07 NOTE — Telephone Encounter (Signed)
Contacted patient regarding test results per Dr. Grier Mitts directions. Patient in agreement. Message sent to scheduling.

## 2019-12-07 NOTE — Telephone Encounter (Signed)
Scheduled appt per 1019 sch msg -pt aware of appts for 10/20 and 10/27

## 2019-12-07 NOTE — Telephone Encounter (Signed)
-----   Message from Brunetta Genera, MD sent at 12/03/2019 12:38 AM EDT ----- Plz let patient know her iron levels are still low and I would recommend 2additional doses of IV Iorn to maintain levels of ferritin >100

## 2019-12-08 ENCOUNTER — Inpatient Hospital Stay: Payer: Medicare Other

## 2019-12-08 ENCOUNTER — Other Ambulatory Visit: Payer: Self-pay

## 2019-12-08 VITALS — BP 145/50 | HR 66 | Temp 97.7°F | Resp 18

## 2019-12-08 DIAGNOSIS — N184 Chronic kidney disease, stage 4 (severe): Secondary | ICD-10-CM

## 2019-12-08 DIAGNOSIS — D508 Other iron deficiency anemias: Secondary | ICD-10-CM

## 2019-12-08 DIAGNOSIS — D631 Anemia in chronic kidney disease: Secondary | ICD-10-CM

## 2019-12-08 LAB — JAK2 (INCLUDING V617F AND EXON 12), MPL,& CALR-NEXT GEN SEQ

## 2019-12-08 MED ORDER — SODIUM CHLORIDE 0.9 % IV SOLN
INTRAVENOUS | Status: DC
  Filled 2019-12-08: qty 250

## 2019-12-08 MED ORDER — SODIUM CHLORIDE 0.9 % IV SOLN
200.0000 mg | Freq: Once | INTRAVENOUS | Status: AC
  Administered 2019-12-08: 200 mg via INTRAVENOUS
  Filled 2019-12-08: qty 200

## 2019-12-08 MED ORDER — ACETAMINOPHEN 325 MG PO TABS
650.0000 mg | ORAL_TABLET | Freq: Once | ORAL | Status: AC
  Administered 2019-12-08: 650 mg via ORAL

## 2019-12-08 MED ORDER — SODIUM CHLORIDE 0.9 % IV SOLN: 750.0000 mg | Freq: Once | INTRAVENOUS | Status: DC

## 2019-12-08 MED ORDER — ACETAMINOPHEN 325 MG PO TABS
ORAL_TABLET | ORAL | Status: AC
  Filled 2019-12-08: qty 2

## 2019-12-08 NOTE — Patient Instructions (Signed)

## 2019-12-09 ENCOUNTER — Telehealth: Payer: Self-pay | Admitting: Hematology

## 2019-12-09 NOTE — Telephone Encounter (Signed)
Scheduled appointments per 10/21 sch msg. Spoke to patient who is aware of appointments.

## 2019-12-15 ENCOUNTER — Inpatient Hospital Stay: Payer: Medicare Other

## 2019-12-15 ENCOUNTER — Other Ambulatory Visit: Payer: Self-pay

## 2019-12-15 VITALS — BP 133/39 | HR 76 | Temp 97.4°F | Resp 17

## 2019-12-15 DIAGNOSIS — D631 Anemia in chronic kidney disease: Secondary | ICD-10-CM

## 2019-12-15 DIAGNOSIS — D508 Other iron deficiency anemias: Secondary | ICD-10-CM

## 2019-12-15 DIAGNOSIS — N184 Chronic kidney disease, stage 4 (severe): Secondary | ICD-10-CM | POA: Diagnosis not present

## 2019-12-15 MED ORDER — ACETAMINOPHEN 325 MG PO TABS
ORAL_TABLET | ORAL | Status: AC
  Filled 2019-12-15: qty 2

## 2019-12-15 MED ORDER — SODIUM CHLORIDE 0.9 % IV SOLN
Freq: Once | INTRAVENOUS | Status: AC
  Filled 2019-12-15: qty 250

## 2019-12-15 MED ORDER — ACETAMINOPHEN 325 MG PO TABS
650.0000 mg | ORAL_TABLET | Freq: Once | ORAL | Status: AC
  Administered 2019-12-15: 650 mg via ORAL

## 2019-12-15 MED ORDER — SODIUM CHLORIDE 0.9 % IV SOLN
200.0000 mg | Freq: Once | INTRAVENOUS | Status: AC
  Administered 2019-12-15: 200 mg via INTRAVENOUS
  Filled 2019-12-15: qty 200

## 2019-12-15 NOTE — Patient Instructions (Signed)

## 2019-12-20 ENCOUNTER — Ambulatory Visit: Payer: Medicare Other | Admitting: Adult Health

## 2019-12-21 ENCOUNTER — Ambulatory Visit: Payer: Self-pay

## 2019-12-21 ENCOUNTER — Encounter: Payer: Self-pay | Admitting: Orthopedic Surgery

## 2019-12-21 ENCOUNTER — Ambulatory Visit: Payer: Medicare Other | Admitting: Physician Assistant

## 2019-12-21 DIAGNOSIS — S92011D Displaced fracture of body of right calcaneus, subsequent encounter for fracture with routine healing: Secondary | ICD-10-CM

## 2019-12-21 NOTE — Progress Notes (Signed)
Office Visit Note   Patient: [name] [name]           Date of Birth: 1932/10/04           MRN: [id number] Visit Date: 12/21/2019              Requested by: Unk Pinto, Ulyss[address] [place],  [place] [address] PCP: Unk Pinto, MD  Chief Complaint  Patient presents with  . Right Foot - Follow-up      HPI: Patient presents in follow-up today for her right heel.  She is over 3 months status post tongue type fracture to the posterior facet of her right calcaneus.  This is been followed with serial x-rays.  She reports she is feeling really well and does not have any pain at all  Assessment & Plan: Visit Diagnoses:  1. Closed displaced fracture of body of right calcaneus with routine healing, subsequent encounter     Plan: She may follow-up as needed.  She understands if she starts having redness or soreness in the back of her heel she should follow-up with [place] immediately  Follow-Up Instructions: No follow-ups on file.   Ortho Exam  Patient is alert, oriented, no adenopathy, well-dressed, normal affect, normal respiratory effort. Right heel posteriorly no skin breakdown no redness no tenderness even to deep palpation Achilles tendon is intact good ankle range of motion no signs of cellulitis or infection Imaging: No results found. No images are attached to the encounter.  Labs: Lab Results  Component Value Date   HGBA1C 4.4 11/01/2019   HGBA1C 4.9 07/12/2019   HGBA1C 5.6 04/05/2019   ESRSEDRATE 9 09/29/2019   ESRSEDRATE 0 05/03/2019   ESRSEDRATE 1 05/02/2014   CRP 14.7 (H) 09/29/2019   LABURIC 14.8 (H) 11/01/2019   LABURIC 10.1 (H) 09/29/2019   LABURIC 11.9 (H) 08/03/2019   REPTSTATUS 09/07/2019 FINAL 09/04/2019   CULT >=100,000 COLONIES/mL ESCHERICHIA COLI (A) 09/04/2019   LABORGA ESCHERICHIA COLI (A) 09/04/2019     Lab Results  Component Value Date   ALBUMIN 3.9 11/26/2019   ALBUMIN 3.8 11/02/2019   ALBUMIN 3.6 09/04/2019    LABURIC 14.8 (H) 11/01/2019   LABURIC 10.1 (H) 09/29/2019   LABURIC 11.9 (H) 08/03/2019    Lab Results  Component Value Date   MG 2.6 (H) 11/01/2019   MG 2.3 07/12/2019   MG 2.4 04/05/2019   Lab Results  Component Value Date   VD25OH 56 11/01/2019   VD25OH 39 04/05/2019   VD25OH 80 09/21/2018    No results found for: PREALBUMIN CBC EXTENDED Latest Ref Rng & Units 11/26/2019 11/17/2019 11/03/2019  WBC 4.0 - 10.5 K/uL 8.0 5.8 -  RBC 3.87 - 5.11 MIL/uL 4.33 4.28 -  HGB 12.0 - 15.0 g/dL 11.1(L) 11.1(L) 9.1(L)  HCT 36 - 46 % 37.8 37.9 31.0(L)  PLT 150 - 400 K/uL 510(H) 457(H) -  NEUTROABS 1.7 - 7.7 K/uL 5.2 4,234 -  LYMPHSABS 0.7 - 4.0 K/uL 0.7 - -     There is no height or weight on file to calculate BMI.  Orders:  Orders Placed This Encounter  Procedures  . XR Os Calcis Right   No orders of the defined types were placed in this encounter.    Procedures: No procedures performed  Clinical Data: No additional findings.  ROS:  All other systems negative, except as noted in the HPI. Review of Systems  Objective: Vital Signs: There were no vitals taken for this visit.  Specialty Comments:  No specialty comments available.  PMFS History: Patient Active Problem List   Diagnosis Date Noted  . Symptomatic anemia 11/02/2019  . DM neuropathy, type II diabetes mellitus ([place])   . Stage 4 chronic kidney disease ([place])   . Obesity (BMI 30-39.9)   . Hypertension   . High cholesterol   . Aortic valve thickening by Cardiac echo in 2015 11/01/2019  . Iron deficiency anemia 05/10/2019  . Secondary hyperparathyroidism of renal origin ([place]) 09/20/2018  . Type 2 diabetes mellitus with stage 4 chronic kidney disease, with long-term current use of insulin ([place]) 12/08/2017  . ACE inhibitor intolerance 12/08/2017  . Vertigo of central origin 11/20/2015  . Morbid obesity ([place]) BMI 35+ with T2DM, htn, hyperlipidemia, CKD, OSA 12/12/2014  . OSA on CPAP 08/08/2014  . Type II  diabetes mellitus with neurological manifestations ([place]) 09/15/2013  . Hyperlipidemia associated with type 2 diabetes mellitus ([place]) 06/27/2013  . Vitamin D deficiency 06/27/2013  . Seizure disorder ([place]) 11/04/2012  . Epilepsy ([place]) 11/04/2012  . Anemia 06/13/2008  . COLONIC POLYPS 06/09/2008  . Hypothyroidism 06/09/2008  . CKD stage 4 due to type 2 diabetes mellitus ([place]) 06/09/2008  . Venous (peripheral) insufficiency 06/09/2008  . Gastroesophageal reflux disease 06/09/2008  . Essential hypertension 06/09/2008   Past Medical History:  Diagnosis Date  . Anemia, unspecified   . Arthritis    "knees; right shoulder" (09/15/2013)  . Basal cell carcinoma    "right upper outer lip"  . DM neuropathy, type II diabetes mellitus ([place])   . Dyspnea 2021   with low iron  . GERD (gastroesophageal reflux disease)   . Gout   . High cholesterol   . Hypertension   . Hypothyroidism   . Meniere's disease   . Obesity (BMI 30-39.9)   . Other forms of epilepsy and recurrent seizures without mention of intractable epilepsy 11/04/2012   Non convulsive paroxysmal spells, responding to [facility], patient not driving.   . Pneumonia ~ 1943  . Skin cancer    "forehead; right hand"  . Stage 4 chronic kidney disease ([place])   . UTI (urinary tract infection)   . Vitamin D deficiency     Family History  Problem Relation Age of Onset  . Heart disease Mother   . Stroke Mother   . Heart disease Father   . Ovarian cancer Sister   . Hypertension Son   . Hypertension Son   . Diabetes Son   . Alcohol abuse Son     Past Surgical History:  Procedure Laterality Date  . CERVICAL POLYPECTOMY    . HAMMER TOE SURGERY Left 1980's  . MOHS SURGERY  20087   "right upper outer lip"  . TONSILLECTOMY  ~ 1947  . WRIST SURGERY Left ~ 1950   "ran arm thru window"   Social History   Occupational History  . Occupation: retired  Tobacco Use  . Smoking status: Former Smoker    Packs/day: 1.00    Years: 30.00     Pack years: 30.00    Types: Cigarettes    Quit date: 06/29/1983    Years since quitting: 36.5  . Smokeless tobacco: Never Used  Vaping Use  . Vaping Use: Never used  Substance and Sexual Activity  . Alcohol use: Yes    Alcohol/week: 0.0 standard drinks    Comment: 09/15/2013 "glass of wine maybe once/year"  . Drug use: No  . Sexual activity: Not Currently

## 2019-12-22 ENCOUNTER — Inpatient Hospital Stay: Payer: Medicare Other | Attending: Hematology

## 2019-12-22 ENCOUNTER — Other Ambulatory Visit: Payer: Self-pay

## 2019-12-22 ENCOUNTER — Telehealth: Payer: Self-pay | Admitting: Internal Medicine

## 2019-12-22 ENCOUNTER — Other Ambulatory Visit: Payer: Self-pay | Admitting: Hematology

## 2019-12-22 VITALS — BP 121/48 | HR 67 | Temp 97.8°F | Resp 16 | Ht 62.0 in | Wt 192.2 lb

## 2019-12-22 DIAGNOSIS — N184 Chronic kidney disease, stage 4 (severe): Secondary | ICD-10-CM

## 2019-12-22 DIAGNOSIS — D631 Anemia in chronic kidney disease: Secondary | ICD-10-CM

## 2019-12-22 DIAGNOSIS — D509 Iron deficiency anemia, unspecified: Secondary | ICD-10-CM | POA: Insufficient documentation

## 2019-12-22 DIAGNOSIS — D508 Other iron deficiency anemias: Secondary | ICD-10-CM

## 2019-12-22 MED ORDER — ACETAMINOPHEN 325 MG PO TABS
650.0000 mg | ORAL_TABLET | Freq: Once | ORAL | Status: AC
  Administered 2019-12-22: 650 mg via ORAL

## 2019-12-22 MED ORDER — ACETAMINOPHEN 325 MG PO TABS
ORAL_TABLET | ORAL | Status: AC
  Filled 2019-12-22: qty 2

## 2019-12-22 MED ORDER — SODIUM CHLORIDE 0.9 % IV SOLN
200.0000 mg | Freq: Once | INTRAVENOUS | Status: AC
  Administered 2019-12-22: 200 mg via INTRAVENOUS
  Filled 2019-12-22: qty 200

## 2019-12-22 MED ORDER — SODIUM CHLORIDE 0.9 % IV SOLN
INTRAVENOUS | Status: DC
  Filled 2019-12-22: qty 250

## 2019-12-22 NOTE — Progress Notes (Signed)
Pt stayed for entire post infusion observation period (30 minutes), tolerated well.  VS stable.

## 2019-12-22 NOTE — Telephone Encounter (Signed)
   [name] [name] DOB: April 05, 1932 MRN: [id number]   RIDER WAIVER AND RELEASE OF LIABILITY  For purposes of improving physical access to our facilities, [facility] is pleased to partner with third parties to provide [place] patients or other authorized individuals the option of convenient, on-demand ground transportation services (the [place]") through use of the technology service that enables users to request on-demand ground transportation from independent third-party providers.  By opting to use and accept these [employer], I, the undersigned, hereby agree on behalf of myself, and on behalf of any minor child using the [employer] for whom I am the parent or legal guardian, as follows:  1. Government social research officer provided to me are provided by independent third-party transportation providers who are not [employer] or employees and who are unaffiliated with [employer]. 2. [facility] is neither a transportation carrier nor a common or public carrier. 3. [facility] has no control over the quality or safety of the transportation that occurs as a result of the [employer]. 4. [facility] cannot guarantee that any third-party transportation provider will complete any arranged transportation service. 5. [facility] makes no representation, warranty, or guarantee regarding the reliability, timeliness, quality, safety, suitability, or availability of any of the Transport Services or that they will be error free. 6. I fully understand that traveling by vehicle involves risks and dangers of serious bodily injury, including permanent disability, paralysis, and death. I agree, on behalf of myself and on behalf of any minor child using the Transport Services for whom I am the parent or legal guardian, that the entire risk arising out of my use of the [employer] remains solely with me, to the maximum extent permitted under applicable law. 7. The [employer] are provided "as is" and "as available." [facility] disclaims all representations and warranties, express, implied or statutory, not expressly set out in these terms, including the implied warranties of merchantability and fitness for a particular purpose. 8. I hereby waive and release [facility], its agents, employees, officers, directors, representatives, insurers, attorneys, assigns, successors, subsidiaries, and affiliates from any and all past, present, or future claims, demands, liabilities, actions, causes of action, or suits of any kind directly or indirectly arising from acceptance and use of the [employer]. 9. I further waive and release  and its affiliates from all present and future liability and responsibility for any injury or death to persons or damages to property caused by or related to the use of the [employer]. 10. I have read this Waiver and Release of Liability, and I understand the terms used in it and their legal significance. This Waiver is freely and voluntarily given with the understanding that my right (as well as the right of any minor child for whom I am the parent or legal guardian using the [employer]) to legal recourse against  in connection with the [employer] is knowingly surrendered in return for use of these services.   I attest that I read the consent document to [name] [name], gave Ms. Trani the opportunity to ask questions and answered the questions asked (if any). I affirm that [name] [name] then provided consent for she's participation in this program.     Darrick Meigs [place]

## 2019-12-22 NOTE — Patient Instructions (Signed)

## 2019-12-27 ENCOUNTER — Other Ambulatory Visit: Payer: Self-pay | Admitting: Internal Medicine

## 2019-12-27 DIAGNOSIS — L97511 Non-pressure chronic ulcer of other part of right foot limited to breakdown of skin: Secondary | ICD-10-CM

## 2019-12-27 LAB — HM DIABETES EYE EXAM

## 2019-12-29 ENCOUNTER — Other Ambulatory Visit: Payer: Self-pay

## 2019-12-29 ENCOUNTER — Inpatient Hospital Stay: Payer: Medicare Other

## 2019-12-29 VITALS — BP 120/40 | HR 74 | Temp 98.4°F | Resp 18

## 2019-12-29 DIAGNOSIS — D509 Iron deficiency anemia, unspecified: Secondary | ICD-10-CM | POA: Diagnosis not present

## 2019-12-29 DIAGNOSIS — D631 Anemia in chronic kidney disease: Secondary | ICD-10-CM

## 2019-12-29 DIAGNOSIS — D508 Other iron deficiency anemias: Secondary | ICD-10-CM

## 2019-12-29 DIAGNOSIS — N184 Chronic kidney disease, stage 4 (severe): Secondary | ICD-10-CM

## 2019-12-29 MED ORDER — SODIUM CHLORIDE 0.9 % IV SOLN
200.0000 mg | Freq: Once | INTRAVENOUS | Status: AC
  Administered 2019-12-29: 200 mg via INTRAVENOUS
  Filled 2019-12-29: qty 200

## 2019-12-29 MED ORDER — SODIUM CHLORIDE 0.9 % IV SOLN
Freq: Once | INTRAVENOUS | Status: AC
  Filled 2019-12-29: qty 250

## 2019-12-29 MED ORDER — ACETAMINOPHEN 325 MG PO TABS
ORAL_TABLET | ORAL | Status: AC
  Filled 2019-12-29: qty 2

## 2019-12-29 MED ORDER — ACETAMINOPHEN 325 MG PO TABS
650.0000 mg | ORAL_TABLET | Freq: Once | ORAL | Status: AC
  Administered 2019-12-29: 650 mg via ORAL

## 2019-12-29 NOTE — Progress Notes (Signed)
Pt remained for entire 30 min post infusion period, tolerated well.  VS stable.

## 2019-12-29 NOTE — Patient Instructions (Signed)

## 2020-01-05 ENCOUNTER — Encounter: Payer: Self-pay | Admitting: *Deleted

## 2020-01-21 ENCOUNTER — Other Ambulatory Visit: Payer: Self-pay | Admitting: Hematology

## 2020-01-21 DIAGNOSIS — D509 Iron deficiency anemia, unspecified: Secondary | ICD-10-CM

## 2020-01-21 NOTE — Progress Notes (Unsigned)
cbc

## 2020-01-24 ENCOUNTER — Other Ambulatory Visit: Payer: Self-pay

## 2020-01-24 ENCOUNTER — Other Ambulatory Visit: Payer: Self-pay | Admitting: Internal Medicine

## 2020-01-24 ENCOUNTER — Inpatient Hospital Stay: Payer: Medicare Other | Attending: Hematology | Admitting: Hematology

## 2020-01-24 ENCOUNTER — Inpatient Hospital Stay: Payer: Medicare Other

## 2020-01-24 VITALS — BP 141/41 | HR 72 | Temp 97.9°F | Resp 18 | Ht 62.0 in | Wt 192.5 lb

## 2020-01-24 DIAGNOSIS — E039 Hypothyroidism, unspecified: Secondary | ICD-10-CM

## 2020-01-24 DIAGNOSIS — D509 Iron deficiency anemia, unspecified: Secondary | ICD-10-CM

## 2020-01-24 DIAGNOSIS — D649 Anemia, unspecified: Secondary | ICD-10-CM | POA: Insufficient documentation

## 2020-01-24 DIAGNOSIS — D75839 Thrombocytosis, unspecified: Secondary | ICD-10-CM | POA: Diagnosis not present

## 2020-01-24 DIAGNOSIS — D471 Chronic myeloproliferative disease: Secondary | ICD-10-CM | POA: Diagnosis not present

## 2020-01-24 DIAGNOSIS — Z1589 Genetic susceptibility to other disease: Secondary | ICD-10-CM

## 2020-01-24 LAB — FERRITIN: Ferritin: 443 ng/mL — ABNORMAL HIGH (ref 11–307)

## 2020-01-24 LAB — CMP (CANCER CENTER ONLY)
ALT: 12 U/L (ref 0–44)
AST: 15 U/L (ref 15–41)
Albumin: 3.9 g/dL (ref 3.5–5.0)
Alkaline Phosphatase: 68 U/L (ref 38–126)
Anion gap: 7 (ref 5–15)
BUN: 40 mg/dL — ABNORMAL HIGH (ref 8–23)
CO2: 23 mmol/L (ref 22–32)
Calcium: 10 mg/dL (ref 8.9–10.3)
Chloride: 111 mmol/L (ref 98–111)
Creatinine: 2.09 mg/dL — ABNORMAL HIGH (ref 0.44–1.00)
GFR, Estimated: 23 mL/min — ABNORMAL LOW (ref >60)
Glucose, Bld: 215 mg/dL — ABNORMAL HIGH (ref 70–99)
Potassium: 4.5 mmol/L (ref 3.5–5.1)
Sodium: 141 mmol/L (ref 135–145)
Total Bilirubin: 0.3 mg/dL (ref 0.3–1.2)
Total Protein: 6.7 g/dL (ref 6.5–8.1)

## 2020-01-24 LAB — IRON AND TIBC
Iron: 80 ug/dL (ref 41–142)
Saturation Ratios: 40 % (ref 21–57)
TIBC: 200 ug/dL — ABNORMAL LOW (ref 236–444)
UIBC: 120 ug/dL (ref 120–384)

## 2020-01-24 LAB — CBC WITH DIFFERENTIAL/PLATELET
Abs Immature Granulocytes: 0.03 10*3/uL (ref 0.00–0.07)
Basophils Absolute: 0 10*3/uL (ref 0.0–0.1)
Basophils Relative: 1 %
Eosinophils Absolute: 0.7 10*3/uL — ABNORMAL HIGH (ref 0.0–0.5)
Eosinophils Relative: 13 %
HCT: 37 % (ref 36.0–46.0)
Hemoglobin: 11 g/dL — ABNORMAL LOW (ref 12.0–15.0)
Immature Granulocytes: 1 %
Lymphocytes Relative: 12 %
Lymphs Abs: 0.7 10*3/uL (ref 0.7–4.0)
MCH: 26 pg (ref 26.0–34.0)
MCHC: 29.7 g/dL — ABNORMAL LOW (ref 30.0–36.0)
MCV: 87.5 fL (ref 80.0–100.0)
Monocytes Absolute: 0.4 10*3/uL (ref 0.1–1.0)
Monocytes Relative: 7 %
Neutro Abs: 3.6 10*3/uL (ref 1.7–7.7)
Neutrophils Relative %: 66 %
Platelets: 344 10*3/uL (ref 150–400)
RBC: 4.23 MIL/uL (ref 3.87–5.11)
RDW: 23.1 % — ABNORMAL HIGH (ref 11.5–15.5)
WBC: 5.4 10*3/uL (ref 4.0–10.5)
nRBC: 0 % (ref 0.0–0.2)
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
## 2020-02-02 ENCOUNTER — Other Ambulatory Visit: Payer: Self-pay

## 2020-02-02 ENCOUNTER — Encounter: Payer: Self-pay | Admitting: Adult Health Nurse Practitioner

## 2020-02-02 ENCOUNTER — Ambulatory Visit: Payer: Medicare Other | Admitting: Adult Health Nurse Practitioner

## 2020-02-02 VITALS — BP 122/68 | HR 61 | Temp 97.6°F | Ht 62.0 in | Wt 191.0 lb

## 2020-02-02 DIAGNOSIS — I10 Essential (primary) hypertension: Secondary | ICD-10-CM

## 2020-02-02 DIAGNOSIS — N184 Chronic kidney disease, stage 4 (severe): Secondary | ICD-10-CM

## 2020-02-02 DIAGNOSIS — G4733 Obstructive sleep apnea (adult) (pediatric): Secondary | ICD-10-CM

## 2020-02-02 DIAGNOSIS — E1169 Type 2 diabetes mellitus with other specified complication: Secondary | ICD-10-CM

## 2020-02-02 DIAGNOSIS — Z0000 Encounter for general adult medical examination without abnormal findings: Secondary | ICD-10-CM

## 2020-02-02 DIAGNOSIS — G40909 Epilepsy, unspecified, not intractable, without status epilepticus: Secondary | ICD-10-CM

## 2020-02-02 DIAGNOSIS — E1122 Type 2 diabetes mellitus with diabetic chronic kidney disease: Secondary | ICD-10-CM | POA: Diagnosis not present

## 2020-02-02 DIAGNOSIS — E559 Vitamin D deficiency, unspecified: Secondary | ICD-10-CM

## 2020-02-02 DIAGNOSIS — Z9989 Dependence on other enabling machines and devices: Secondary | ICD-10-CM

## 2020-02-02 DIAGNOSIS — Z794 Long term (current) use of insulin: Secondary | ICD-10-CM

## 2020-02-02 DIAGNOSIS — R6889 Other general symptoms and signs: Secondary | ICD-10-CM

## 2020-02-02 DIAGNOSIS — E039 Hypothyroidism, unspecified: Secondary | ICD-10-CM

## 2020-02-02 DIAGNOSIS — Z789 Other specified health status: Secondary | ICD-10-CM

## 2020-02-02 DIAGNOSIS — Z79899 Other long term (current) drug therapy: Secondary | ICD-10-CM

## 2020-02-02 DIAGNOSIS — I872 Venous insufficiency (chronic) (peripheral): Secondary | ICD-10-CM

## 2020-02-02 DIAGNOSIS — Z0001 Encounter for general adult medical examination with abnormal findings: Secondary | ICD-10-CM | POA: Diagnosis not present

## 2020-02-02 DIAGNOSIS — N2581 Secondary hyperparathyroidism of renal origin: Secondary | ICD-10-CM

## 2020-02-02 DIAGNOSIS — E785 Hyperlipidemia, unspecified: Secondary | ICD-10-CM

## 2020-02-02 DIAGNOSIS — D509 Iron deficiency anemia, unspecified: Secondary | ICD-10-CM

## 2020-02-02 DIAGNOSIS — K219 Gastro-esophageal reflux disease without esophagitis: Secondary | ICD-10-CM

## 2020-02-02 DIAGNOSIS — D126 Benign neoplasm of colon, unspecified: Secondary | ICD-10-CM

## 2020-02-02 MED ORDER — HUMULIN 70/30 (70-30) 100 UNIT/ML ~~LOC~~ SUSP: 20.0000 [IU] | Freq: Two times a day (BID) | SUBCUTANEOUS | 3 refills | Status: DC

## 2020-02-02 NOTE — Progress Notes (Signed)
MEDICARE ANNUAL WELLNESS VISIT AND FOLLOW UP  Assessment:    Encounter for Medicare annual wellness exam Yearly  Secondary hyperparathyroidism monitor  Essential hypertension - continue medications, DASH diet, exercise and monitor at home. Call if greater than 130/80.   OSA Does not use  CKD stage 4 due to type 2 diabetes mellitus (HCC) Increase fluids, avoid NSAIDS, monitor sugars, will monitor  Type II diabetes mellitus with neurological manifestations [facility]) Discussed general issues about diabetes pathophysiology and management., Educational material distributed., Suggested low cholesterol diet., Encouraged aerobic exercise., Discussed foot care. Increase by 2 units in the AM if sugars over 150 Increase exercise, likely increased due to lack of activity with COVID Keep food and sugar log  Hypothyroidism, unspecified type Hypothyroidism-check TSH level, continue medications the same, reminded to take on an empty stomach 30-22mins before food.   Seizure disorder ([place]) Continue meds  Type 2 diabetes mellitus with hyperlipidemia ([place]) Patient reports  Insulin dependent diabetes mellitus ([place]) Discussed general issues about diabetes pathophysiology and management., Educational material distributed., Suggested low cholesterol diet., Encouraged aerobic exercise., Discussed foot care.  Hyperlipidemia check lipids- do crestor every other day since difficult to cut decrease fatty foods increase activity.  GOAL IS 70  Morbid obesity (BMI 39.86)  (HCC) - follow up 3 months for progress monitoring - increase veggies, decrease carbs - long discussion about weight loss, diet, and exercise  Iron deficiency anemia, unspecified iron deficiency anemia type -     Due to CKD  Venous (peripheral) insufficiency - weight loss discussed, start compression stockings and elevation  Benign neoplasm of colon, unspecified part of colon Monitor  Gastroesophageal reflux disease,  esophagitis presence not specified Continue PPI/H2 blocker, diet discussed  Vitamin D deficiency Continue supplement  ACE inhibitor intolerance Due to CKD  Medication management 1 year  Over 40 minutes of face to face interview,  exam, counseling, chart review, and critical decision making was performed.  Future Appointments  Date Time Provider [place]  04/24/2020 11:30 AM [facility] None  04/24/2020 12:00 PM Brunetta Genera, MD [facility] None  05/05/2020 10:30 AM Unk Pinto, MD [facility] None  11/14/2020 10:00 AM Unk Pinto, MD [facility] None  02/01/2021 10:00 AM [name], NP [facility] None    Plan:   During the course of the visit the patient was educated and counseled about appropriate screening and preventive services including:    Pneumococcal vaccine   Influenza vaccine  Td vaccine  Prevnar 13  Screening electrocardiogram  Screening mammography  Bone densitometry screening  Colorectal cancer screening  Diabetes screening  Glaucoma screening  Nutrition counseling   Advanced directives: given info/requested copies.    Subjective:   [name] [name] is a 86 y.o. female who presents for Medicare Annual Wellness Visit and 3 month follow up on hypertension, prediabetes, hyperlipidemia, vitamin D def.  She reports overall she is doing well.  She has no health or medication concerns today. She uses freestyle libre to check blood glucose.  She is checking her blood glucose at times four times a day and as needed.  She reports difficultly getting blood from the area.   Her blood pressure has been controlled at home, today their BP is BP: 122/68.   Son drives her, she has not driven in several years. She lives at [place] home, has [employer] certified [employer], and has house so she gets out in her garden.   She does workout. She denies chest pain, shortness of breath, dizziness.   Has  been following with derm, skin surgery  center, history of [place] She is following with ortho for left knee pain.   Follows with neuro for seizures, Dr. Clabe Seal, she is on Keppra three times a day.    She has been working on diet and exercise for diabetes  with stage 4 CKD NOT on ACE due to CKD Hyperlipidemia on crestor 5 mg, has been take 1/2 pill but states that it is hard to cut, at goal she is on bASA  she is on 20 units of humulin in the morning and 20 at night  Sugars have been running 130-150 in the morning.  Lowest was 99 she was asymptomatic.  Highest 170's fasting. Eye Doctor Dr. Ellie Lunch denies foot ulcerations, hyperglycemia, hypoglycemia , increased appetite, nausea, paresthesia of the feet, polydipsia, polyuria, visual disturbances, vomiting and weight loss.  Last A1C in the office was:  Lab Results  Component Value Date   HGBA1C 7.2 (H) 02/02/2020   Last GFR Lab Results  Component Value Date   GFRAA 27 (L) 02/02/2020   Lab Results  Component Value Date   CHOL 126 02/02/2020   HDL 30 (L) 02/02/2020   LDLCALC 77 02/02/2020   TRIG 102 02/02/2020   CHOLHDL 4.2 02/02/2020    Patient is on Vitamin D supplement. Lab Results  Component Value Date   VD25OH 56 11/01/2019     BMI is Body mass index is 34.93 kg/m., she is working on diet and exercise. Wt Readings from Last 3 Encounters:  02/02/20 191 lb (86.6 kg)  01/24/20 192 lb 8 oz (87.3 kg)  12/22/19 192 lb 4 oz (87.2 kg)   She is on thyroid medication. Her medication was not changed last visit.   Lab Results  Component Value Date   TSH 2.19 02/02/2020  .   Medication Review  Current Outpatient Medications (Endocrine & Metabolic):  .  levothyroxine (SYNTHROID) 100 MCG tablet, TAKE 1 TABLET BY MOUTH DAILY ON AN EMPTY STOMACH WITH ONLY WATER FOR 30 MINUTES. NO ANTACIDS, CALCIUM OR MAGNESIUM FOR 4 HOURS. AVOID BIOTIN .  insulin NPH-regular Human (HUMULIN 70/30) (70-30) 100 UNIT/ML injection, Inject 20 Units into the skin 2 (two) times daily with a  meal.  Current Outpatient Medications (Cardiovascular):  .  atenolol (TENORMIN) 50 MG tablet, Take     1 tablet     Daily       for BP .  furosemide (LASIX) 40 MG tablet, TAKE 1 TABLET BY MOUTH DAILY FOR FLUID RETENTION/ANKLE SWELLING  Current Outpatient Medications (Respiratory):  .  loratadine (CLARITIN) 10 MG tablet, Take 10 mg by mouth daily.  Current Outpatient Medications (Analgesics):  .  acetaminophen (TYLENOL) 500 MG tablet, Take 1,000 mg by mouth every 6 (six) hours as needed for moderate pain. Marland Kitchen  allopurinol (ZYLOPRIM) 100 MG tablet, Take 1 tablet daily to prevent gout. Marland Kitchen  aspirin 81 MG tablet, Take 81 mg by mouth daily.  Current Outpatient Medications (Hematological):  .  vitamin B-12 (CYANOCOBALAMIN) 100 MCG tablet, Take 100 mcg by mouth daily.   Current Outpatient Medications (Other):  .  cholecalciferol (VITAMIN D3) 25 MCG (1000 UNIT) tablet, Take 1,000 Units by mouth daily. Per Nephrologist, start taking 1000 units, but patient taking 5000 units presently. .  fish oil-omega-3 fatty acids 1000 MG capsule, Take 2 g by mouth at bedtime. Marland Kitchen  glucose blood (ONETOUCH VERIO) test strip, Check blood sugar 3 times daily-[id number] .  Lancets (ONETOUCH ULTRASOFT) lancets, Insulin dependent check sugars 2-3 x  a day- 3 month supply .  lansoprazole (PREVACID) 30 MG capsule, Take    1 capsule    Daily       to Prevent Heartburn & Indigestion .  levETIRAcetam (KEPPRA) 500 MG tablet, Take 1 tablet 3 x /day with Meals to Prevent Seizures .  meclizine (ANTIVERT) 25 MG tablet, Take     1 tablet     3 x /day     Only if needed for Dizziness or Vertigo .  rOPINIRole (REQUIP) 1 MG tablet, Take 1/2 to 1 tablet 3 x /day as needed for Restless Legs & Cramps (Patient taking differently: Take 1 mg by mouth 3 (three) times daily.) .  vitamin C (ASCORBIC ACID) 500 MG tablet, Take 1,000 mg by mouth 3 (three) times daily.   Current Problems (verified) Patient Active Problem List   Diagnosis Date Noted   . Symptomatic anemia 11/02/2019  . DM neuropathy, type II diabetes mellitus ([place])   . Stage 4 chronic kidney disease ([place])   . Obesity (BMI 30-39.9)   . Hypertension   . High cholesterol   . Aortic valve thickening by Cardiac echo in 2015 11/01/2019  . Iron deficiency anemia 05/10/2019  . Secondary hyperparathyroidism of renal origin ([place]) 09/20/2018  . Type 2 diabetes mellitus with stage 4 chronic kidney disease, with long-term current use of insulin ([place]) 12/08/2017  . ACE inhibitor intolerance 12/08/2017  . Vertigo of central origin 11/20/2015  . Morbid obesity ([place]) BMI 35+ with T2DM, htn, hyperlipidemia, CKD, OSA 12/12/2014  . OSA on CPAP 08/08/2014  . Type II diabetes mellitus with neurological manifestations ([place]) 09/15/2013  . Hyperlipidemia associated with type 2 diabetes mellitus ([place]) 06/27/2013  . Vitamin D deficiency 06/27/2013  . Seizure disorder ([place]) 11/04/2012  . Epilepsy ([place]) 11/04/2012  . Anemia 06/13/2008  . COLONIC POLYPS 06/09/2008  . Hypothyroidism 06/09/2008  . CKD stage 4 due to type 2 diabetes mellitus ([place]) 06/09/2008  . Venous (peripheral) insufficiency 06/09/2008  . Gastroesophageal reflux disease 06/09/2008  . Essential hypertension 06/09/2008    Screening Tests Immunization History  Administered Date(s) Administered  . DTaP 03/18/2007  . Influenza, High Dose Seasonal PF 10/18/2013, 12/12/2014, 10/24/2015, 10/28/2016, 12/08/2017, 12/01/2018  . Influenza-Unspecified 11/18/2012, 12/02/2019  . PFIZER SARS-COV-2 Vaccination 03/10/2019, 03/31/2019, 11/26/2019  . Pneumococcal Conjugate-13 12/12/2014  . Pneumococcal Polysaccharide-23 09/16/2013  . Td 12/08/2017  . Zoster 02/28/2005    Preventative care: Last colonoscopy: 2010 no follow up due to age Last mammogram: 11/2017 , complete DEXA: 2017, declines Echo 2015 Sleep study 2016 SARS-COV2-Booster 11/26/19  Prior vaccinations: TD: 2019  Influenza: 11/19/19  Pneumococcal: 2015 Prevnar13:  2016 Shingles/Zostavax: 2007  Names of Other Physician/Practitioners you currently use: 1. [facility]- here for primary care 2. Dr. Ellie Lunch, eye doctor, last visit 2020 3. Dr. Lovena Le, dentist, last visit 2017  Patient Care Team: Unk Pinto, MD as PCP - General (Internal Medicine) Rozetta Nunnery, MD as Consulting Physician (Otolaryngology) Teena Irani, MD (Inactive) as Consulting Physician (Gastroenterology) Marcy Panning, MD as Consulting Physician (Oncology) Claudia Desanctis, MD as Consulting Physician (Internal Medicine)  Allergies Allergies  Allergen Reactions  . Ppd [Tuberculin Purified Protein Derivative] Other (See Comments)    indurated    SURGICAL HISTORY She  has a past surgical history that includes Tonsillectomy (~ 1947); Wrist surgery (Left, ~ 1950); Cervical polypectomy; Hammer toe surgery (Left, 1980's); and Mohs surgery (20087). FAMILY HISTORY Her family history includes Alcohol abuse in her son; Diabetes in her  son; Heart disease in her father and mother; Hypertension in her son and son; Ovarian cancer in her sister; Stroke in her mother. SOCIAL HISTORY She  reports that she quit smoking about 36 years ago. Her smoking use included cigarettes. She has a 30.00 pack-year smoking history. She has never used smokeless tobacco. She reports current alcohol use. She reports that she does not use drugs.  MEDICARE WELLNESS OBJECTIVES: Physical activity: Current Exercise Habits: Home exercise routine, Type of exercise: walking, Time (Minutes): 30, Frequency (Times/Week): 5, Weekly Exercise (Minutes/Week): 150, Intensity: Mild Cardiac risk factors: Cardiac Risk Factors include: advanced age (>72men, >80 women);diabetes mellitus;dyslipidemia;hypertension;obesity (BMI >30kg/m2);sedentary lifestyle Depression/mood screen:   Depression screen [facility] 2/9 02/02/2020  Decreased Interest 0  Down, Depressed, Hopeless 0  PHQ - 2 Score 0   Some recent data might be hidden    ADLs:  In your present state of health, do you have any difficulty performing the following activities: 02/02/2020 11/03/2019  Hearing? N N  Vision? N N  Difficulty concentrating or making decisions? N N  Walking or climbing stairs? N Y  Comment - -  Dressing or bathing? N N  Doing errands, shopping? N [place] and eating ? N -  Using the Toilet? N -  In the past six months, have you accidently leaked urine? N -  Do you have problems with loss of bowel control? N -  Managing your Medications? N -  Managing your Finances? N -  Housekeeping or managing your Housekeeping? N -  Some recent data might be hidden     Cognitive Testing  Alert? Yes  Normal Appearance?Yes  Oriented to person? Yes  Place? Yes   Time? Yes  Recall of three objects?  2/3  Can perform simple calculations? Yes  Displays appropriate judgment?Yes  Can read the correct time from a watch face?Yes  EOL planning: Does Patient Have a Medical Advance Directive?: No Does patient want to make changes to medical advance directive?: No - Patient declined Would patient like information on creating a medical advance directive?: No - Patient declined   Objective:   Today's Vitals   02/02/20 1030  BP: 122/68  Pulse: 61  Temp: 97.6 F (36.4 C)  SpO2: 95%  Weight: 191 lb (86.6 kg)  Height: 5\' 2"  (1.575 m)   Body mass index is 34.93 kg/m.  General Appearance: Well nourished, in no apparent distress. Eyes: PERRLA, EOMs, conjunctiva no swelling or erythema Sinuses: No Frontal/maxillary tenderness ENT/Mouth: Ext aud canals clear, TMs without erythema, bulging. No erythema, swelling, or exudate on post pharynx.  Tonsils not swollen or erythematous. Hearing normal.  Neck: Supple, thyroid normal.  Respiratory: Respiratory effort normal, BS equal bilaterally without rales, rhonchi, wheezing or stridor.  Cardio: RRR with no MRGs. Brisk peripheral pulses without  edema.  Abdomen: Soft, + BS.  Non tender, no guarding, rebound, hernias, masses. Lymphatics: Non tender without lymphadenopathy.  Musculoskeletal: Full ROM, 5/5 strength, Normal gait Skin: Warm, dry without rashes, lesions, ecchymosis.  Neuro: Cranial nerves intact. No cerebellar symptoms.  Psych: Awake and oriented X 3, normal affect, Insight and Judgment appropriate.   Medicare Attestation I have personally reviewed: The patient's medical and social history Their use of alcohol, tobacco or illicit drugs Their current medications and supplements The patient's functional ability including ADLs,fall risks, home safety risks, cognitive, and hearing and visual impairment Diet and physical activities Evidence for depression or mood disorders  The patient's weight, height, BMI,  and visual acuity have been recorded in the chart.  I have made referrals, counseling, and provided education to the patient based on review of the above and I have provided the patient with a written personalized care plan for preventive services.     [name], NP   02/02/2020

## 2020-02-03 LAB — LIPID PANEL
Cholesterol: 126 mg/dL (ref <200)
HDL: 30 mg/dL — ABNORMAL LOW (ref >=50)
LDL Cholesterol (Calc): 77 mg/dL (calc)
Non-HDL Cholesterol (Calc): 96 mg/dL (calc) (ref <130)
Total CHOL/HDL Ratio: 4.2 (calc) (ref <5.0)
Triglycerides: 102 mg/dL (ref <150)

## 2020-02-03 LAB — CBC WITH DIFFERENTIAL/PLATELET
Absolute Monocytes: 415 cells/uL (ref 200–950)
Basophils Absolute: 37 cells/uL (ref 0–200)
Basophils Relative: 0.6 %
Eosinophils Absolute: 725 cells/uL — ABNORMAL HIGH (ref 15–500)
Eosinophils Relative: 11.7 %
HCT: 35.2 % (ref 35.0–45.0)
Hemoglobin: 10.9 g/dL — ABNORMAL LOW (ref 11.7–15.5)
Lymphs Abs: 694 cells/uL — ABNORMAL LOW (ref 850–3900)
MCH: 26.3 pg — ABNORMAL LOW (ref 27.0–33.0)
MCHC: 31 g/dL — ABNORMAL LOW (ref 32.0–36.0)
MCV: 85 fL (ref 80.0–100.0)
MPV: 9.8 fL (ref 7.5–12.5)
Monocytes Relative: 6.7 %
Neutro Abs: 4328 cells/uL (ref 1500–7800)
Neutrophils Relative %: 69.8 %
Platelets: 332 10*3/uL (ref 140–400)
RBC: 4.14 10*6/uL (ref 3.80–5.10)
RDW: 21.7 % — ABNORMAL HIGH (ref 11.0–15.0)
Total Lymphocyte: 11.2 %
WBC: 6.2 10*3/uL (ref 3.8–10.8)

## 2020-02-03 LAB — COMPLETE METABOLIC PANEL WITH GFR
AG Ratio: 2 (calc) (ref 1.0–2.5)
ALT: 9 U/L (ref 6–29)
AST: 12 U/L (ref 10–35)
Albumin: 4.2 g/dL (ref 3.6–5.1)
Alkaline phosphatase (APISO): 61 U/L (ref 37–153)
BUN/Creatinine Ratio: 23 (calc) — ABNORMAL HIGH (ref 6–22)
BUN: 44 mg/dL — ABNORMAL HIGH (ref 7–25)
CO2: 26 mmol/L (ref 20–32)
Calcium: 9.6 mg/dL (ref 8.6–10.4)
Chloride: 109 mmol/L (ref 98–110)
Creat: 1.95 mg/dL — ABNORMAL HIGH (ref 0.60–0.88)
GFR, Est African American: 27 mL/min/{1.73_m2} — ABNORMAL LOW (ref >=60)
GFR, Est Non African American: 23 mL/min/{1.73_m2} — ABNORMAL LOW (ref >=60)
Globulin: 2.1 g/dL (calc) (ref 1.9–3.7)
Glucose, Bld: 207 mg/dL — ABNORMAL HIGH (ref 65–99)
Potassium: 4.5 mmol/L (ref 3.5–5.3)
Sodium: 141 mmol/L (ref 135–146)
Total Bilirubin: 0.4 mg/dL (ref 0.2–1.2)
Total Protein: 6.3 g/dL (ref 6.1–8.1)

## 2020-02-03 LAB — HEMOGLOBIN A1C
Hgb A1c MFr Bld: 7.2 % of total Hgb — ABNORMAL HIGH (ref <5.7)
Mean Plasma Glucose: 160 mg/dL
eAG (mmol/L): 8.9 mmol/L

## 2020-02-03 LAB — TSH: TSH: 2.19 mIU/L (ref 0.40–4.50)

## 2020-02-13 ENCOUNTER — Other Ambulatory Visit: Payer: Self-pay | Admitting: Internal Medicine

## 2020-02-13 DIAGNOSIS — K219 Gastro-esophageal reflux disease without esophagitis: Secondary | ICD-10-CM

## 2020-02-13 DIAGNOSIS — G40909 Epilepsy, unspecified, not intractable, without status epilepticus: Secondary | ICD-10-CM

## 2020-02-13 DIAGNOSIS — G2581 Restless legs syndrome: Secondary | ICD-10-CM

## 2020-02-13 MED ORDER — LANSOPRAZOLE 30 MG PO CPDR: DELAYED_RELEASE_CAPSULE | ORAL | 0 refills | Status: DC

## 2020-02-13 MED ORDER — ROPINIROLE HCL 1 MG PO TABS: ORAL_TABLET | ORAL | 0 refills | Status: DC

## 2020-02-13 MED ORDER — LEVETIRACETAM 500 MG PO TABS: ORAL_TABLET | ORAL | 0 refills | Status: DC

## 2020-04-03 ENCOUNTER — Other Ambulatory Visit: Payer: Self-pay

## 2020-04-03 ENCOUNTER — Inpatient Hospital Stay (HOSPITAL_COMMUNITY)
Admission: EM | Admit: 2020-04-03 | Discharge: 2020-04-06 | DRG: 812 | Disposition: A | Discharge status: Home / Self Care | Payer: Medicare Other | Attending: Internal Medicine | Admitting: Internal Medicine

## 2020-04-03 ENCOUNTER — Encounter (HOSPITAL_COMMUNITY): Payer: Self-pay | Admitting: Emergency Medicine

## 2020-04-03 ENCOUNTER — Emergency Department (HOSPITAL_COMMUNITY): Payer: Medicare Other

## 2020-04-03 DIAGNOSIS — Z87440 Personal history of urinary (tract) infections: Secondary | ICD-10-CM

## 2020-04-03 DIAGNOSIS — M109 Gout, unspecified: Secondary | ICD-10-CM | POA: Diagnosis present

## 2020-04-03 DIAGNOSIS — E1165 Type 2 diabetes mellitus with hyperglycemia: Secondary | ICD-10-CM | POA: Diagnosis present

## 2020-04-03 DIAGNOSIS — D638 Anemia in other chronic diseases classified elsewhere: Secondary | ICD-10-CM | POA: Diagnosis present

## 2020-04-03 DIAGNOSIS — E1169 Type 2 diabetes mellitus with other specified complication: Secondary | ICD-10-CM | POA: Diagnosis not present

## 2020-04-03 DIAGNOSIS — Z66 Do not resuscitate: Secondary | ICD-10-CM | POA: Diagnosis present

## 2020-04-03 DIAGNOSIS — Z79890 Hormone replacement therapy (postmenopausal): Secondary | ICD-10-CM

## 2020-04-03 DIAGNOSIS — R011 Cardiac murmur, unspecified: Secondary | ICD-10-CM | POA: Diagnosis present

## 2020-04-03 DIAGNOSIS — Z85828 Personal history of other malignant neoplasm of skin: Secondary | ICD-10-CM

## 2020-04-03 DIAGNOSIS — Z7982 Long term (current) use of aspirin: Secondary | ICD-10-CM

## 2020-04-03 DIAGNOSIS — Z6835 Body mass index (BMI) 35.0-35.9, adult: Secondary | ICD-10-CM

## 2020-04-03 DIAGNOSIS — N2581 Secondary hyperparathyroidism of renal origin: Secondary | ICD-10-CM | POA: Diagnosis present

## 2020-04-03 DIAGNOSIS — D128 Benign neoplasm of rectum: Secondary | ICD-10-CM

## 2020-04-03 DIAGNOSIS — D62 Acute posthemorrhagic anemia: Secondary | ICD-10-CM | POA: Diagnosis not present

## 2020-04-03 DIAGNOSIS — Z833 Family history of diabetes mellitus: Secondary | ICD-10-CM

## 2020-04-03 DIAGNOSIS — E119 Type 2 diabetes mellitus without complications: Secondary | ICD-10-CM

## 2020-04-03 DIAGNOSIS — R42 Dizziness and giddiness: Secondary | ICD-10-CM | POA: Diagnosis not present

## 2020-04-03 DIAGNOSIS — G40909 Epilepsy, unspecified, not intractable, without status epilepticus: Secondary | ICD-10-CM | POA: Diagnosis present

## 2020-04-03 DIAGNOSIS — K219 Gastro-esophageal reflux disease without esophagitis: Secondary | ICD-10-CM | POA: Diagnosis present

## 2020-04-03 DIAGNOSIS — D75839 Thrombocytosis, unspecified: Secondary | ICD-10-CM | POA: Diagnosis present

## 2020-04-03 DIAGNOSIS — K921 Melena: Secondary | ICD-10-CM

## 2020-04-03 DIAGNOSIS — K644 Residual hemorrhoidal skin tags: Secondary | ICD-10-CM | POA: Diagnosis present

## 2020-04-03 DIAGNOSIS — I129 Hypertensive chronic kidney disease with stage 1 through stage 4 chronic kidney disease, or unspecified chronic kidney disease: Secondary | ICD-10-CM | POA: Diagnosis present

## 2020-04-03 DIAGNOSIS — K2970 Gastritis, unspecified, without bleeding: Secondary | ICD-10-CM | POA: Diagnosis present

## 2020-04-03 DIAGNOSIS — Z888 Allergy status to other drugs, medicaments and biological substances status: Secondary | ICD-10-CM

## 2020-04-03 DIAGNOSIS — H8109 Meniere's disease, unspecified ear: Secondary | ICD-10-CM | POA: Diagnosis present

## 2020-04-03 DIAGNOSIS — Z20822 Contact with and (suspected) exposure to covid-19: Secondary | ICD-10-CM | POA: Diagnosis present

## 2020-04-03 DIAGNOSIS — Z8249 Family history of ischemic heart disease and other diseases of the circulatory system: Secondary | ICD-10-CM

## 2020-04-03 DIAGNOSIS — E1149 Type 2 diabetes mellitus with other diabetic neurological complication: Secondary | ICD-10-CM | POA: Diagnosis present

## 2020-04-03 DIAGNOSIS — E1122 Type 2 diabetes mellitus with diabetic chronic kidney disease: Secondary | ICD-10-CM | POA: Diagnosis present

## 2020-04-03 DIAGNOSIS — M170 Bilateral primary osteoarthritis of knee: Secondary | ICD-10-CM | POA: Diagnosis present

## 2020-04-03 DIAGNOSIS — E559 Vitamin D deficiency, unspecified: Secondary | ICD-10-CM | POA: Diagnosis present

## 2020-04-03 DIAGNOSIS — Z87891 Personal history of nicotine dependence: Secondary | ICD-10-CM

## 2020-04-03 DIAGNOSIS — K5730 Diverticulosis of large intestine without perforation or abscess without bleeding: Secondary | ICD-10-CM | POA: Diagnosis present

## 2020-04-03 DIAGNOSIS — Z86010 Personal history of colonic polyps: Secondary | ICD-10-CM

## 2020-04-03 DIAGNOSIS — D649 Anemia, unspecified: Secondary | ICD-10-CM | POA: Diagnosis not present

## 2020-04-03 DIAGNOSIS — K648 Other hemorrhoids: Secondary | ICD-10-CM | POA: Diagnosis present

## 2020-04-03 DIAGNOSIS — I10 Essential (primary) hypertension: Secondary | ICD-10-CM | POA: Diagnosis present

## 2020-04-03 DIAGNOSIS — E039 Hypothyroidism, unspecified: Secondary | ICD-10-CM | POA: Diagnosis present

## 2020-04-03 DIAGNOSIS — Z794 Long term (current) use of insulin: Secondary | ICD-10-CM

## 2020-04-03 DIAGNOSIS — N184 Chronic kidney disease, stage 4 (severe): Secondary | ICD-10-CM | POA: Diagnosis present

## 2020-04-03 DIAGNOSIS — G2581 Restless legs syndrome: Secondary | ICD-10-CM | POA: Diagnosis present

## 2020-04-03 DIAGNOSIS — E669 Obesity, unspecified: Secondary | ICD-10-CM | POA: Diagnosis present

## 2020-04-03 DIAGNOSIS — K922 Gastrointestinal hemorrhage, unspecified: Secondary | ICD-10-CM | POA: Diagnosis present

## 2020-04-03 DIAGNOSIS — K5900 Constipation, unspecified: Secondary | ICD-10-CM | POA: Diagnosis present

## 2020-04-03 DIAGNOSIS — D509 Iron deficiency anemia, unspecified: Secondary | ICD-10-CM | POA: Diagnosis present

## 2020-04-03 DIAGNOSIS — E785 Hyperlipidemia, unspecified: Secondary | ICD-10-CM | POA: Diagnosis present

## 2020-04-03 DIAGNOSIS — D120 Benign neoplasm of cecum: Secondary | ICD-10-CM | POA: Diagnosis present

## 2020-04-03 DIAGNOSIS — M19011 Primary osteoarthritis, right shoulder: Secondary | ICD-10-CM | POA: Diagnosis present

## 2020-04-03 DIAGNOSIS — Z79899 Other long term (current) drug therapy: Secondary | ICD-10-CM

## 2020-04-03 DIAGNOSIS — N179 Acute kidney failure, unspecified: Secondary | ICD-10-CM | POA: Diagnosis present

## 2020-04-03 DIAGNOSIS — Z8701 Personal history of pneumonia (recurrent): Secondary | ICD-10-CM

## 2020-04-03 LAB — CBC
HCT: 24 % — ABNORMAL LOW (ref 36.0–46.0)
Hemoglobin: 6.2 g/dL — CL (ref 12.0–15.0)
MCH: 24.1 pg — ABNORMAL LOW (ref 26.0–34.0)
MCHC: 25.8 g/dL — ABNORMAL LOW (ref 30.0–36.0)
MCV: 93.4 fL (ref 80.0–100.0)
Platelets: 365 10*3/uL (ref 150–400)
RBC: 2.57 MIL/uL — ABNORMAL LOW (ref 3.87–5.11)
RDW: 22.4 % — ABNORMAL HIGH (ref 11.5–15.5)
WBC: 5.2 10*3/uL (ref 4.0–10.5)
nRBC: 0.8 % — ABNORMAL HIGH (ref 0.0–0.2)

## 2020-04-03 LAB — COMPREHENSIVE METABOLIC PANEL
ALT: 15 U/L (ref 0–44)
AST: 28 U/L (ref 15–41)
Albumin: 4.1 g/dL (ref 3.5–5.0)
Alkaline Phosphatase: 42 U/L (ref 38–126)
Anion gap: 13 (ref 5–15)
BUN: 79 mg/dL — ABNORMAL HIGH (ref 8–23)
CO2: 18 mmol/L — ABNORMAL LOW (ref 22–32)
Calcium: 9.2 mg/dL (ref 8.9–10.3)
Chloride: 110 mmol/L (ref 98–111)
Creatinine, Ser: 2.49 mg/dL — ABNORMAL HIGH (ref 0.44–1.00)
GFR, Estimated: 18 mL/min — ABNORMAL LOW (ref >60)
Glucose, Bld: 164 mg/dL — ABNORMAL HIGH (ref 70–99)
Potassium: 3.8 mmol/L (ref 3.5–5.1)
Sodium: 141 mmol/L (ref 135–145)
Total Bilirubin: 0.9 mg/dL (ref 0.3–1.2)
Total Protein: 6.8 g/dL (ref 6.5–8.1)

## 2020-04-03 LAB — DIFFERENTIAL
Abs Immature Granulocytes: 0.05 10*3/uL (ref 0.00–0.07)
Basophils Absolute: 0 10*3/uL (ref 0.0–0.1)
Basophils Relative: 1 %
Eosinophils Absolute: 0.3 10*3/uL (ref 0.0–0.5)
Eosinophils Relative: 5 %
Immature Granulocytes: 1 %
Lymphocytes Relative: 9 %
Lymphs Abs: 0.5 10*3/uL — ABNORMAL LOW (ref 0.7–4.0)
Monocytes Absolute: 0.4 10*3/uL (ref 0.1–1.0)
Monocytes Relative: 7 %
Neutro Abs: 4 10*3/uL (ref 1.7–7.7)
Neutrophils Relative %: 77 %

## 2020-04-03 LAB — BRAIN NATRIURETIC PEPTIDE: B Natriuretic Peptide: 500.8 pg/mL — ABNORMAL HIGH (ref 0.0–100.0)

## 2020-04-03 LAB — PREPARE RBC (CROSSMATCH)

## 2020-04-03 LAB — RESP PANEL BY RT-PCR (FLU A&B, COVID) ARPGX2
Influenza A by PCR: NEGATIVE
Influenza B by PCR: NEGATIVE
SARS Coronavirus 2 by RT PCR: NEGATIVE

## 2020-04-03 LAB — POC OCCULT BLOOD, ED: Fecal Occult Bld: POSITIVE — AB

## 2020-04-03 MED ORDER — ONDANSETRON HCL 4 MG/2ML IJ SOLN: 4.0000 mg | Freq: Four times a day (QID) | INTRAMUSCULAR | Status: DC | PRN

## 2020-04-03 MED ORDER — MECLIZINE HCL 25 MG PO TABS: 25.0000 mg | ORAL_TABLET | Freq: Every day | ORAL | Status: DC | PRN

## 2020-04-03 MED ORDER — INSULIN ASPART 100 UNIT/ML ~~LOC~~ SOLN
2.0000 [IU] | Freq: Three times a day (TID) | SUBCUTANEOUS | Status: DC
  Administered 2020-04-04 – 2020-04-05 (×4): 2 [IU] via SUBCUTANEOUS
  Filled 2020-04-03: qty 0.02

## 2020-04-03 MED ORDER — INSULIN DETEMIR 100 UNIT/ML ~~LOC~~ SOLN
10.0000 [IU] | Freq: Every day | SUBCUTANEOUS | Status: DC
  Administered 2020-04-03 – 2020-04-05 (×3): 10 [IU] via SUBCUTANEOUS
  Filled 2020-04-03 (×3): qty 0.1

## 2020-04-03 MED ORDER — SODIUM CHLORIDE 0.9 % IV SOLN: 250.0000 mL | INTRAVENOUS | Status: DC | PRN

## 2020-04-03 MED ORDER — SODIUM CHLORIDE 0.9% FLUSH: 3.0000 mL | INTRAVENOUS | Status: DC | PRN

## 2020-04-03 MED ORDER — LEVOTHYROXINE SODIUM 100 MCG PO TABS
100.0000 ug | ORAL_TABLET | Freq: Every day | ORAL | Status: DC
  Administered 2020-04-05 – 2020-04-06 (×2): 100 ug via ORAL
  Filled 2020-04-03 (×3): qty 1

## 2020-04-03 MED ORDER — ROPINIROLE HCL 1 MG PO TABS
1.0000 mg | ORAL_TABLET | Freq: Three times a day (TID) | ORAL | Status: DC
  Administered 2020-04-03 – 2020-04-06 (×8): 1 mg via ORAL
  Filled 2020-04-03 (×8): qty 1

## 2020-04-03 MED ORDER — VITAMIN B-12 100 MCG PO TABS
100.0000 ug | ORAL_TABLET | Freq: Every day | ORAL | Status: DC
  Administered 2020-04-04 – 2020-04-06 (×3): 100 ug via ORAL
  Filled 2020-04-03 (×3): qty 1

## 2020-04-03 MED ORDER — PANTOPRAZOLE SODIUM 40 MG PO TBEC
40.0000 mg | DELAYED_RELEASE_TABLET | Freq: Every day | ORAL | Status: DC
  Administered 2020-04-03: 40 mg via ORAL
  Filled 2020-04-03: qty 1

## 2020-04-03 MED ORDER — ALLOPURINOL 100 MG PO TABS
100.0000 mg | ORAL_TABLET | Freq: Every day | ORAL | Status: DC
  Administered 2020-04-03 – 2020-04-06 (×4): 100 mg via ORAL
  Filled 2020-04-03 (×3): qty 1

## 2020-04-03 MED ORDER — ACETAMINOPHEN 650 MG RE SUPP: 650.0000 mg | Freq: Four times a day (QID) | RECTAL | Status: DC | PRN

## 2020-04-03 MED ORDER — SODIUM CHLORIDE 0.9% FLUSH
3.0000 mL | Freq: Two times a day (BID) | INTRAVENOUS | Status: DC
  Administered 2020-04-03 – 2020-04-06 (×6): 3 mL via INTRAVENOUS

## 2020-04-03 MED ORDER — SODIUM CHLORIDE 0.9 % IV SOLN
10.0000 mL/h | Freq: Once | INTRAVENOUS | Status: AC
  Administered 2020-04-03: 10 mL/h via INTRAVENOUS

## 2020-04-03 MED ORDER — LORATADINE 10 MG PO TABS
10.0000 mg | ORAL_TABLET | Freq: Every day | ORAL | Status: DC
  Administered 2020-04-04 – 2020-04-06 (×3): 10 mg via ORAL
  Filled 2020-04-03 (×3): qty 1

## 2020-04-03 MED ORDER — ATENOLOL 50 MG PO TABS
50.0000 mg | ORAL_TABLET | Freq: Every day | ORAL | Status: DC
  Administered 2020-04-03 – 2020-04-06 (×4): 50 mg via ORAL
  Filled 2020-04-03 (×3): qty 1

## 2020-04-03 MED ORDER — ONDANSETRON HCL 4 MG PO TABS: 4.0000 mg | ORAL_TABLET | Freq: Four times a day (QID) | ORAL | Status: DC | PRN

## 2020-04-03 MED ORDER — VITAMIN D 25 MCG (1000 UNIT) PO TABS
1000.0000 [IU] | ORAL_TABLET | Freq: Every day | ORAL | Status: DC
  Administered 2020-04-04 – 2020-04-06 (×3): 1000 [IU] via ORAL
  Filled 2020-04-03 (×3): qty 1

## 2020-04-03 MED ORDER — ACETAMINOPHEN 325 MG PO TABS: 650.0000 mg | ORAL_TABLET | Freq: Four times a day (QID) | ORAL | Status: DC | PRN

## 2020-04-03 MED ORDER — BOOST / RESOURCE BREEZE PO LIQD CUSTOM: 1.0000 | Freq: Three times a day (TID) | ORAL | Status: DC

## 2020-04-03 MED ORDER — SODIUM CHLORIDE 0.9 % IV BOLUS
500.0000 mL | Freq: Once | INTRAVENOUS | Status: AC
  Administered 2020-04-03: 500 mL via INTRAVENOUS

## 2020-04-03 MED ORDER — LEVETIRACETAM 500 MG PO TABS
500.0000 mg | ORAL_TABLET | Freq: Three times a day (TID) | ORAL | Status: DC
  Administered 2020-04-03 – 2020-04-05 (×5): 500 mg via ORAL
  Filled 2020-04-03 (×5): qty 1

## 2020-04-03 MED ORDER — FUROSEMIDE 40 MG PO TABS
40.0000 mg | ORAL_TABLET | Freq: Every day | ORAL | Status: DC
  Administered 2020-04-04 – 2020-04-06 (×3): 40 mg via ORAL
  Filled 2020-04-03 (×3): qty 1

## 2020-04-03 MED ORDER — INSULIN ASPART 100 UNIT/ML ~~LOC~~ SOLN
0.0000 [IU] | Freq: Three times a day (TID) | SUBCUTANEOUS | Status: DC
  Administered 2020-04-04: 1 [IU] via SUBCUTANEOUS
  Administered 2020-04-04: 2 [IU] via SUBCUTANEOUS
  Administered 2020-04-04: 1 [IU] via SUBCUTANEOUS
  Administered 2020-04-05: 2 [IU] via SUBCUTANEOUS
  Administered 2020-04-05: 1 [IU] via SUBCUTANEOUS
  Filled 2020-04-03: qty 0.09

## 2020-04-03 NOTE — ED Notes (Signed)
Report called and given to nurse.  

## 2020-04-03 NOTE — ED Notes (Signed)
Provider notified of 6.2 HgB

## 2020-04-03 NOTE — ED Triage Notes (Signed)
Per pt, states she saw her kidney MD today-stated her Hgb was 6.4-history of anemia-last transfusion in September-states she has been feeling SOB for 1 week

## 2020-04-03 NOTE — ED Provider Notes (Signed)
[name] [name]   CSN: [id number] Arrival date & time: 04/03/20  1503     History Chief Complaint  Patient presents with  . low Hgb    [name] [name] is a 87 y.o. female with a history of anemia, hypothyroidism, high cholesterol, hypertension, diabetes, heart murmur, presenting to the emergency department with lightheadedness and anemia.  The patient reports that she had blood work done earlier today and was told she needs to come to the ER because her hemoglobin was 6.4.  She has a history of anemia and reports that she needed iron transfusion in September.  She has had blood transfusions in the past.  She reports her stools are sometimes dark, sometimes brown.  She does not take iron at home.  She noted a small amount of frank blood occasionally in her stool, but also comments that she has hemorrhoids.  She reports with approximately 1 week she has felt she has low energy, lightheadedness, shortness of breath with exertion.  She denies fevers or chills.  Last colonoscopy and endoscopy was in 2010 with single colon polyp removed, no ulcers noted in EGD.  She has not seen GI since then - her doctor was Dr. Henrene Pastor with [facility].  HPI     Past Medical History:  Diagnosis Date  . Anemia, unspecified   . Arthritis    "knees; right shoulder" (09/15/2013)  . Basal cell carcinoma    "right upper outer lip"  . DM neuropathy, type II diabetes mellitus ([place])   . Dyspnea 2021   with low iron  . GERD (gastroesophageal reflux disease)   . Gout   . High cholesterol   . Hypertension   . Hypothyroidism   . Meniere's disease   . Obesity (BMI 30-39.9)   . Other forms of epilepsy and recurrent seizures without mention of intractable epilepsy 11/04/2012   Non convulsive paroxysmal spells, responding to [facility], patient not driving.   . Pneumonia ~ 1943  . Skin cancer    "forehead; right hand"  . Stage 4 chronic kidney disease ([place])   . UTI (urinary  tract infection)   . Vitamin D deficiency     Patient Active Problem List   Diagnosis Date Noted  . Symptomatic anemia 11/02/2019  . DM neuropathy, type II diabetes mellitus ([place])   . Stage 4 chronic kidney disease ([place])   . Obesity (BMI 30-39.9)   . Hypertension   . High cholesterol   . Aortic valve thickening by Cardiac echo in 2015 11/01/2019  . Iron deficiency anemia 05/10/2019  . Secondary hyperparathyroidism of renal origin ([place]) 09/20/2018  . Type 2 diabetes mellitus with stage 4 chronic kidney disease, with long-term current use of insulin ([place]) 12/08/2017  . ACE inhibitor intolerance 12/08/2017  . Vertigo of central origin 11/20/2015  . Morbid obesity ([place]) BMI 35+ with T2DM, htn, hyperlipidemia, CKD, OSA 12/12/2014  . OSA on CPAP 08/08/2014  . Type II diabetes mellitus with neurological manifestations ([place]) 09/15/2013  . Hyperlipidemia associated with type 2 diabetes mellitus ([place]) 06/27/2013  . Vitamin D deficiency 06/27/2013  . Seizure disorder ([place]) 11/04/2012  . Epilepsy ([place]) 11/04/2012  . Anemia 06/13/2008  . COLONIC POLYPS 06/09/2008  . Hypothyroidism 06/09/2008  . CKD stage 4 due to type 2 diabetes mellitus ([place]) 06/09/2008  . Venous (peripheral) insufficiency 06/09/2008  . Gastroesophageal reflux disease 06/09/2008  . Essential hypertension 06/09/2008    Past Surgical History:  Procedure Laterality Date  . CERVICAL POLYPECTOMY    .  HAMMER TOE SURGERY Left 1980's  . MOHS SURGERY  20087   "right upper outer lip"  . TONSILLECTOMY  ~ 1947  . WRIST SURGERY Left ~ 1950   "ran arm thru window"     OB History   No obstetric history on file.     Family History  Problem Relation Age of Onset  . Heart disease Mother   . Stroke Mother   . Heart disease Father   . Ovarian cancer Sister   . Hypertension Son   . Hypertension Son   . Diabetes Son   . Alcohol abuse Son     Social History   Tobacco Use  . Smoking status: Former Smoker    Packs/day:  1.00    Years: 30.00    Pack years: 30.00    Types: Cigarettes    Quit date: 06/29/1983    Years since quitting: 36.7  . Smokeless tobacco: Never Used  Vaping Use  . Vaping Use: Never used  Substance Use Topics  . Alcohol use: Yes    Alcohol/week: 0.0 standard drinks    Comment: 09/15/2013 "glass of wine maybe once/year"  . Drug use: No    Home Medications Prior to Admission medications   Medication Sig Start Date End Date Taking? Authorizing Provider  acetaminophen (TYLENOL) 500 MG tablet Take 1,000 mg by mouth every 6 (six) hours as needed for moderate pain.   Yes [provider]  allopurinol (ZYLOPRIM) 100 MG tablet Take 1 tablet daily to prevent gout. Patient taking differently: Take 100 mg by mouth daily as needed (gout). 11/17/19  Yes Unk Pinto, MD  aspirin 81 MG tablet Take 81 mg by mouth daily.   Yes [provider]  atenolol (TENORMIN) 50 MG tablet Take     1 tablet     Daily       for BP Patient taking differently: Take 50 mg by mouth daily. 12/27/19  Yes Unk Pinto, MD  cholecalciferol (VITAMIN D3) 25 MCG (1000 UNIT) tablet Take 1,000 Units by mouth daily.   Yes [provider]  fish oil-omega-3 fatty acids 1000 MG capsule Take 1,000 mg by mouth at bedtime.   Yes [provider]  furosemide (LASIX) 40 MG tablet TAKE 1 TABLET BY MOUTH DAILY FOR FLUID RETENTION/ANKLE SWELLING Patient taking differently: Take 40 mg by mouth daily. 12/27/19  Yes McClanahan, Danton Sewer, NP  insulin NPH-regular Human (HUMULIN 70/30) (70-30) 100 UNIT/ML injection Inject 20 Units into the skin 2 (two) times daily with a meal. 02/02/20  Yes McClanahan, Kyra, NP  lansoprazole (PREVACID) 30 MG capsule Take    1 capsule    Daily       to Prevent Heartburn & Indigestion Patient taking differently: Take 30 mg by mouth at bedtime. 02/13/20  Yes Unk Pinto, MD  levETIRAcetam (KEPPRA) 500 MG tablet Take      1 tablet       3 x /day       with Meals to Prevent  Seizures Patient taking differently: Take 500 mg by mouth 3 (three) times daily. 02/13/20  Yes Unk Pinto, MD  levothyroxine (SYNTHROID) 100 MCG tablet TAKE 1 TABLET BY MOUTH DAILY ON AN EMPTY STOMACH WITH ONLY WATER FOR 30 MINUTES. NO ANTACIDS, CALCIUM OR MAGNESIUM FOR 4 HOURS. AVOID BIOTIN Patient taking differently: Take 100 mcg by mouth daily before breakfast. 01/24/20  Yes Unk Pinto, MD  loratadine (CLARITIN) 10 MG tablet Take 10 mg by mouth daily.   Yes [provider]  meclizine (ANTIVERT) 25 MG tablet Take     1 tablet     3 x /day     Only if needed for Dizziness or Vertigo Patient taking differently: Take 25 mg by mouth daily as needed for dizziness or nausea. Take     1 tablet     3 x /day     Only if needed for Dizziness or Vertigo 11/23/19  Yes Unk Pinto, MD  rOPINIRole (REQUIP) 1 MG tablet Take       1/2 to 1 tablet        3 x /day         as needed for Restless Legs & Cramps Patient taking differently: Take 1 mg by mouth 3 (three) times daily. 02/13/20  Yes Unk Pinto, MD  vitamin B-12 (CYANOCOBALAMIN) 100 MCG tablet Take 100 mcg by mouth daily.    Yes [provider]  vitamin C (ASCORBIC ACID) 500 MG tablet Take 1,000 mg by mouth 3 (three) times daily.    Yes [provider]  glucose blood (ONETOUCH VERIO) test strip Check blood sugar 3 times daily-[id number] 04/05/19   Unk Pinto, MD  Lancets [facility] ULTRASOFT) lancets Insulin dependent check sugars 2-3 x a day- 3 month supply 09/15/19   Vladimir Crofts, PA-C    Allergies    Ppd [tuberculin purified protein derivative]  Review of Systems   Review of Systems  Constitutional: Negative for chills and fever.  Respiratory: Positive for shortness of breath. Negative for cough.   Cardiovascular: Negative for chest pain and palpitations.  Gastrointestinal: Positive for blood in stool. Negative for abdominal pain and vomiting.  Genitourinary: Negative for dysuria and hematuria.   Musculoskeletal: Negative for arthralgias and myalgias.  Skin: Negative for color change and rash.  Neurological: Positive for light-headedness. Negative for syncope.  All other systems reviewed and are negative.   Physical Exam Updated Vital Signs BP (!) 126/44 Comment: Simultaneous filing. User may not have seen previous data.  Pulse 72 Comment: Simultaneous filing. User may not have seen previous data.  Temp 98 F (36.7 C) (Oral)   Resp 15 Comment: Simultaneous filing. User may not have seen previous data.  Wt 88 kg   SpO2 95%   BMI 35.48 kg/m   Physical Exam Constitutional:      General: She is not in acute distress. HENT:     Head: Normocephalic and atraumatic.  Eyes:     Pupils: Pupils are equal, round, and reactive to light.     Comments: Conjunctival pallor  Cardiovascular:     Rate and Rhythm: Normal rate and regular rhythm.     Heart sounds: Murmur heard.      Comments: Prominent systolic murmur most audible and 2nd right intercostal space Pulmonary:     Effort: Pulmonary effort is normal. No respiratory distress.  Abdominal:     General: There is no distension.     Tenderness: There is no abdominal tenderness.  Skin:    General: Skin is warm and dry.     Coloration: Skin is pale.  Neurological:     General: No focal deficit present.     Mental Status: She is alert. Mental status is at baseline.  Psychiatric:        Mood and Affect: Mood normal.        Behavior: Behavior normal.     ED Results / Procedures / Treatments   Labs (all labs ordered are listed, but only abnormal results  are displayed) Labs Reviewed  COMPREHENSIVE METABOLIC PANEL - Abnormal; Notable for the following components:      Result Value   CO2 18 (*)    Glucose, Bld 164 (*)    BUN 79 (*)    Creatinine, Ser 2.49 (*)    GFR, Estimated 18 (*)    All other components within normal limits  CBC - Abnormal; Notable for the following components:   RBC 2.57 (*)    Hemoglobin 6.2 (*)     HCT 24.0 (*)    MCH 24.1 (*)    MCHC 25.8 (*)    RDW 22.4 (*)    nRBC 0.8 (*)    All other components within normal limits  DIFFERENTIAL - Abnormal; Notable for the following components:   Lymphs Abs 0.5 (*)    All other components within normal limits  POC OCCULT BLOOD, ED - Abnormal; Notable for the following components:   Fecal Occult Bld POSITIVE (*)    All other components within normal limits  RESP PANEL BY RT-PCR (FLU A&B, COVID) ARPGX2  BRAIN NATRIURETIC PEPTIDE  TYPE AND SCREEN  PREPARE RBC (CROSSMATCH)    EKG EKG Interpretation  Date/Time:  Monday April 03 2020 16:06:18 EST Ventricular Rate:  74 PR Interval:    QRS Duration: 146 QT Interval:  429 QTC Calculation: 476 R Axis:   -103 Text Interpretation: Sinus rhythm Short PR interval RBBB and LAFB Similar pattern to Sept 2021 ecg No STEMI Confirmed by Octaviano Glow [phone]) on 04/03/2020 4:15:04 PM   Radiology DG Chest Portable 1 View  Result Date: 04/03/2020 CLINICAL DATA:  Shortness of breath EXAM: PORTABLE CHEST 1 VIEW COMPARISON:  11/16/2017 FINDINGS: Probable tiny pleural effusions. Borderline cardiomegaly with vascular congestion and probable mild pulmonary edema. Aortic atherosclerosis. No pneumothorax. IMPRESSION: Borderline cardiomegaly with vascular congestion and mild pulmonary edema. Probable small pleural effusions. Electronically Signed   By: Donavan Foil M.D.   On: 04/03/2020 16:24    Procedures .Critical Care Performed by: Wyvonnia Dusky, MD Authorized by: Wyvonnia Dusky, MD   Critical care provider statement:    Critical care time (minutes):  45   Critical care was necessary to treat or prevent imminent or life-threatening deterioration of the following conditions:  Circulatory failure   Critical care was time spent personally by me on the following activities:  Discussions with consultants, evaluation of patient's response to treatment, examination of patient, ordering and  performing treatments and interventions, ordering and review of laboratory studies, ordering and review of radiographic studies, pulse oximetry, re-evaluation of patient's condition, obtaining history from patient or surrogate and review of old charts Comments:     Symptomatic anemia, pRBC transfusion     Medications Ordered in ED Medications  0.9 %  sodium chloride infusion (10 mL/hr Intravenous New Bag/Given 04/03/20 1642)  sodium chloride 0.9 % bolus 500 mL (500 mLs Intravenous New Bag/Given 04/03/20 1641)    ED Course  I have reviewed the triage vital signs and the nursing notes.  Pertinent labs & imaging results that were available during my care of the patient were reviewed by me and considered in my medical decision making (see chart for details).  This patient complains of SOB, lightheadedness.  This involves an extensive number of treatment options, and is a complaint that carries with it a high risk of complications and morbidity.  The differential diagnosis includes symptomatic anemia most likely vs metabolic derangement vs infection vs other  Last echo in 2015 with mildly  thickened and calcified AV, Ef 60-65%.  Anemia may be 2/2 GI bleed or hemolysis from possible aortic valve pathology (prominent murmur noted) vs other cause.  Some pulm congestion on xray raises possibility of possible mild CHF.  She may benefit from echo.  No need for emergent diuresis without hypoxia and with her CKD worsening.    She has no chest pain or symptoms while at rest to suggest active angina at this time.  I ordered, reviewed, and interpreted labs, which included hgb 6.2, MCV 93.  BUN 79, Cr 2.49 (recent baseline Cr 1.8-2.0 over past 4 months) Ordered IV fluids 500 cc for possible prerenal kidney injury/dehydration I ordered imaging studies which included DG chest I independently visualized and interpreted imaging which showed cardiomegaly and the monitor tracing which showed chronic bifascicular  block pattern Previous records obtained and reviewed showing ECG/Colonoscopy records from 2010 I consulted GI and discussed lab and imaging findings  After the interventions stated above, I reevaluated the patient and found she remained HD stable for admission.   Clinical Course as of 04/03/20 1721  Mon Apr 03, 2020  1612 Hgb 6.2.  Patient consented for blood transfusion.  2units PRBC ordered.  Hemoccult without gross melena [MT]  1705 Admitted to hospitalist.  [facility] team notified and added to care team - Dr Hilarie Fredrickson is aware of admission.  [MT]    Clinical Course User Index [MT] Kang Ishida, Carola Rhine, MD    Final Clinical Impression(s) / ED Diagnoses Final diagnoses:  Symptomatic anemia  Blood in stool  Heart murmur    Rx / DC Orders ED Discharge Orders    None       Langston Masker Carola Rhine, MD 04/03/20 1721

## 2020-04-03 NOTE — H&P (Addendum)
.  [facility] History and Physical  [name] [name] TMH:[id number] DOB: [birth year] DOA: 04/03/2020  Referring physician: ED  PCP: Unk Pinto, MD   Patient is coming from: Independent living facility  Chief Complaint: Lightheadedness, low hemoglobin  HPI: [name] [name] is a 87 y.o. female with past medical history of anemia, chronic kidney disease, hypothyroidism, hyperlipidemia, hypertension, diabetes mellitus, heart murmur presented to hospital with complaints of lightheadedness.  Patient had a blood work done today which was noted to have hemoglobin of 6.4 and the patient was transferred to our hospital.  She did have history of transfusion back in September 2021.  Patient reported some dark stools but has history of hemorrhoids.  She did have colonoscopy in 2010 with a single polyp which was removed.  Patient does not admit to using Eliquis takes baby aspirin.  Patient denies any chest pain, palpitation has some shortness of breath especially on exertion.  Patient admits to increased leg swelling as well but takes Lasix at home.  Denies any fever, chills or rigor.  Patient denies any sick contacts or recent travel.  Denies any urinary urgency, frequency or dysuria.  She does have polyuria from Lasix.  Patient uses a walker for ambulation at home.  ED Course: In the ED, patient did have symptomatic anemia.  BNP was elevated at 500.  Hemoglobin was 6.2.  Fecal occult was positive.  Patient was ordered 2 units of packed RBC and was considered for admission to the hospital.  [facility] was notified for potential endoscopic intervention.  Review of Systems:  All systems were reviewed and were negative unless otherwise mentioned in the HPI  Past Medical History:  Diagnosis Date  . Anemia, unspecified   . Arthritis    "knees; right shoulder" (09/15/2013)  . Basal cell carcinoma    "right upper outer lip"  . DM neuropathy, type II diabetes mellitus ([place])   . Dyspnea 2021    with low iron  . GERD (gastroesophageal reflux disease)   . Gout   . High cholesterol   . Hypertension   . Hypothyroidism   . Meniere's disease   . Obesity (BMI 30-39.9)   . Other forms of epilepsy and recurrent seizures without mention of intractable epilepsy 11/04/2012   Non convulsive paroxysmal spells, responding to [facility], patient not driving.   . Pneumonia ~ 1943  . Skin cancer    "forehead; right hand"  . Stage 4 chronic kidney disease ([place])   . UTI (urinary tract infection)   . Vitamin D deficiency    Past Surgical History:  Procedure Laterality Date  . CERVICAL POLYPECTOMY    . HAMMER TOE SURGERY Left 1980's  . MOHS SURGERY  20087   "right upper outer lip"  . TONSILLECTOMY  ~ 1947  . WRIST SURGERY Left ~ 1950   "ran arm thru window"    Social History:  reports that she quit smoking about 36 years ago. Her smoking use included cigarettes. She has a 30.00 pack-year smoking history. She has never used smokeless tobacco. She reports current alcohol use. She reports that she does not use drugs.  Allergies  Allergen Reactions  . Ppd [Tuberculin Purified Protein Derivative] Other (See Comments)    indurated    Family History  Problem Relation Age of Onset  . Heart disease Mother   . Stroke Mother   . Heart disease Father   . Ovarian cancer Sister   . Hypertension Son   . Hypertension Son   .  Diabetes Son   . Alcohol abuse Son      Prior to Admission medications   Medication Sig Start Date End Date Taking? Authorizing Provider  acetaminophen (TYLENOL) 500 MG tablet Take 1,000 mg by mouth every 6 (six) hours as needed for moderate pain.   Yes [provider]  allopurinol (ZYLOPRIM) 100 MG tablet Take 1 tablet daily to prevent gout. Patient taking differently: Take 100 mg by mouth daily as needed (gout). 11/17/19  Yes Unk Pinto, MD  aspirin 81 MG tablet Take 81 mg by mouth daily.   Yes [provider]  atenolol (TENORMIN) 50 MG tablet Take      1 tablet     Daily       for BP Patient taking differently: Take 50 mg by mouth daily. 12/27/19  Yes Unk Pinto, MD  cholecalciferol (VITAMIN D3) 25 MCG (1000 UNIT) tablet Take 1,000 Units by mouth daily.   Yes [provider]  fish oil-omega-3 fatty acids 1000 MG capsule Take 1,000 mg by mouth at bedtime.   Yes [provider]  furosemide (LASIX) 40 MG tablet TAKE 1 TABLET BY MOUTH DAILY FOR FLUID RETENTION/ANKLE SWELLING Patient taking differently: Take 40 mg by mouth daily. 12/27/19  Yes McClanahan, Danton Sewer, NP  insulin NPH-regular Human (HUMULIN 70/30) (70-30) 100 UNIT/ML injection Inject 20 Units into the skin 2 (two) times daily with a meal. 02/02/20  Yes McClanahan, Kyra, NP  lansoprazole (PREVACID) 30 MG capsule Take    1 capsule    Daily       to Prevent Heartburn & Indigestion Patient taking differently: Take 30 mg by mouth at bedtime. 02/13/20  Yes Unk Pinto, MD  levETIRAcetam (KEPPRA) 500 MG tablet Take      1 tablet       3 x /day       with Meals to Prevent Seizures Patient taking differently: Take 500 mg by mouth 3 (three) times daily. 02/13/20  Yes Unk Pinto, MD  levothyroxine (SYNTHROID) 100 MCG tablet TAKE 1 TABLET BY MOUTH DAILY ON AN EMPTY STOMACH WITH ONLY WATER FOR 30 MINUTES. NO ANTACIDS, CALCIUM OR MAGNESIUM FOR 4 HOURS. AVOID BIOTIN Patient taking differently: Take 100 mcg by mouth daily before breakfast. 01/24/20  Yes Unk Pinto, MD  loratadine (CLARITIN) 10 MG tablet Take 10 mg by mouth daily.   Yes [provider]  meclizine (ANTIVERT) 25 MG tablet Take     1 tablet     3 x /day     Only if needed for Dizziness or Vertigo Patient taking differently: Take 25 mg by mouth daily as needed for dizziness or nausea. Take     1 tablet     3 x /day     Only if needed for Dizziness or Vertigo 11/23/19  Yes Unk Pinto, MD  rOPINIRole (REQUIP) 1 MG tablet Take       1/2 to 1 tablet        3 x /day         as needed for Restless  Legs & Cramps Patient taking differently: Take 1 mg by mouth 3 (three) times daily. 02/13/20  Yes Unk Pinto, MD  vitamin B-12 (CYANOCOBALAMIN) 100 MCG tablet Take 100 mcg by mouth daily.    Yes [provider]  vitamin C (ASCORBIC ACID) 500 MG tablet Take 1,000 mg by mouth 3 (three) times daily.    Yes [provider]  glucose blood (ONETOUCH VERIO) test strip Check blood sugar  3 times daily-[id number] 04/05/19   Unk Pinto, MD  Lancets [facility] ULTRASOFT) lancets Insulin dependent check sugars 2-3 x a day- 3 month supply 09/15/19   Vladimir Crofts, [place]    Physical Exam: Vitals:   04/03/20 1510 04/03/20 1530 04/03/20 1605 04/03/20 1700  BP: (!) 135/46 (!) 125/41 (!) 121/41 (!) 126/44  Pulse: 78 76 75 71  Resp: 16 18 14 18   Temp: 98.3 F (36.8 C)     TempSrc: Oral     SpO2: 94% 97% 97% 95%  Weight: 88 kg      Wt Readings from Last 3 Encounters:  04/03/20 88 kg  02/02/20 86.6 kg  01/24/20 87.3 kg   Body mass index is 35.48 kg/m.  General:  Obese built, not in obvious distress, obese HENT: Normocephalic, pupils equally reacting to light and accommodation.  Pallor noted ++.  Oral mucosa is moist.  Chest:  Clear breath sounds.  Diminished breath sounds bilaterally. No crackles or wheezes.  CVS: S1 &S2 heard.  Loud systolic murmur noted.  Regular rate and rhythm. Abdomen: Soft, nontender, nondistended.  Bowel sounds are heard.  Liver is not palpable, no abdominal mass palpated Extremities: No cyanosis, clubbing but with edema++.  Peripheral pulses are palpable. Psych: Alert, awake and oriented, normal mood CNS:  No cranial nerve deficits.  Power equal in all extremities.   No cerebellar signs.   Skin: Warm and dry.  No rashes noted.  Labs on Admission:   CBC: Recent Labs  Lab 04/03/20 1533  WBC 5.2  HGB 6.2*  HCT 24.0*  MCV 93.4  PLT 761    Basic Metabolic Panel: Recent Labs  Lab 04/03/20 1533  NA 141  K 3.8  CL 110  CO2 18*   GLUCOSE 164*  BUN 79*  CREATININE 2.49*  CALCIUM 9.2    Liver Function Tests: Recent Labs  Lab 04/03/20 1533  AST 28  ALT 15  ALKPHOS 42  BILITOT 0.9  PROT 6.8  ALBUMIN 4.1   No results for input(s): LIPASE, AMYLASE in the last 168 hours. No results for input(s): AMMONIA in the last 168 hours.  Cardiac Enzymes: No results for input(s): CKTOTAL, CKMB, CKMBINDEX, TROPONINI in the last 168 hours.  BNP (last 3 results) No results for input(s): BNP in the last 8760 hours.  ProBNP (last 3 results) No results for input(s): PROBNP in the last 8760 hours.  CBG: No results for input(s): GLUCAP in the last 168 hours.   Drugs of Abuse  No results found for: LABOPIA, COCAINSCRNUR, LABBENZ, AMPHETMU, THCU, LABBARB    Radiological Exams on Admission: DG Chest Portable 1 View  Result Date: 04/03/2020 CLINICAL DATA:  Shortness of breath EXAM: PORTABLE CHEST 1 VIEW COMPARISON:  11/16/2017 FINDINGS: Probable tiny pleural effusions. Borderline cardiomegaly with vascular congestion and probable mild pulmonary edema. Aortic atherosclerosis. No pneumothorax. IMPRESSION: Borderline cardiomegaly with vascular congestion and mild pulmonary edema. Probable small pleural effusions. Electronically Signed   By: Donavan Foil M.D.   On: 04/03/2020 16:24    EKG: Personally reviewed by me which shows normal sinus rhythm with RBBB  Assessment/Plan Principal Problem:   Symptomatic anemia Active Problems:   Hypothyroidism   Gastroesophageal reflux disease   Essential hypertension   Hyperlipidemia associated with type 2 diabetes mellitus (HCC)   Type II diabetes mellitus with neurological manifestations ([place])   Type 2 diabetes mellitus with stage 4 chronic kidney disease, with long-term current use of insulin (HCC)  Symptomatic anemia.  Hemoccult was positive.  Patient will receive 2 units of packed RBC.  GI has been notified.  History of colonoscopy with findings of solitary colonic polyp 2010.   Seen by [facility] in the past. History of hemorrhoids as per the patient.  Will hold aspirin from home.  Patient was seen by [place] [facility] today for transfusion  Type 2 diabetes mellitus.  Patient is on NPH at home.  Will continue on sliding scale insulin, Accu-Cheks, Lantus.  Dose of insulin to 10 units of Lantus at nighttime.  Low-dose sliding scale for now since patient will be kept n.p.o. after midnight  CKD stage IV.  We will continue to monitor closely.  Check BMP in a.m.  Peripheral edema shortness of breath could be secondary to anemia.  Patient takes Lasix at home.  Will check a 2D echocardiogram since patient has significant morbid  GERD.  Continue PPI.  Essential hypertension Continue atenolol.  Continue monitor blood pressure  History of seizure.  On Keppra.  Restless leg syndrome.  Continue ropinirole.  Hypothyroidism.  Continue Synthroid.   DVT Prophylaxis: Sequential compression device  Consultant: [facility]  Code Status: DNR.  I spoke with the patient about it.  Microbiology none  Antibiotics: None  Family Communication:  Patients' condition and plan of care including tests being ordered have been discussed with the patient's son on the phone who indicate understanding and agree with the plan.  Status is: Observation  The patient remains OBS appropriate and will d/c before 2 midnights.  Dispo: The patient is from: Home              Anticipated d/c is to: Home              Anticipated d/c date is: 1 day              Patient currently is not medically stable to d/c.   Difficult to place patient Yes   Severity of Illness: The appropriate patient status for this patient is OBSERVATION. Observation status is judged to be reasonable and necessary in order to provide the required intensity of service to ensure the patient's safety. The patient's presenting symptoms, physical exam findings, and initial radiographic and laboratory data in the context of  their medical condition is felt to place them at decreased risk for further clinical deterioration. Furthermore, it is anticipated that the patient will be medically stable for discharge from the hospital within 2 midnights of admission. The following factors support the patient status of observation.   Signed, Flora Lipps, MD [facility] 04/03/2020

## 2020-04-04 DIAGNOSIS — E039 Hypothyroidism, unspecified: Secondary | ICD-10-CM | POA: Diagnosis present

## 2020-04-04 DIAGNOSIS — K219 Gastro-esophageal reflux disease without esophagitis: Secondary | ICD-10-CM | POA: Diagnosis present

## 2020-04-04 DIAGNOSIS — R42 Dizziness and giddiness: Secondary | ICD-10-CM | POA: Diagnosis present

## 2020-04-04 DIAGNOSIS — K2970 Gastritis, unspecified, without bleeding: Secondary | ICD-10-CM | POA: Diagnosis present

## 2020-04-04 DIAGNOSIS — K644 Residual hemorrhoidal skin tags: Secondary | ICD-10-CM | POA: Diagnosis present

## 2020-04-04 DIAGNOSIS — N184 Chronic kidney disease, stage 4 (severe): Secondary | ICD-10-CM | POA: Diagnosis present

## 2020-04-04 DIAGNOSIS — K5730 Diverticulosis of large intestine without perforation or abscess without bleeding: Secondary | ICD-10-CM | POA: Diagnosis present

## 2020-04-04 DIAGNOSIS — K921 Melena: Secondary | ICD-10-CM | POA: Diagnosis present

## 2020-04-04 DIAGNOSIS — G2581 Restless legs syndrome: Secondary | ICD-10-CM | POA: Diagnosis present

## 2020-04-04 DIAGNOSIS — D509 Iron deficiency anemia, unspecified: Secondary | ICD-10-CM | POA: Diagnosis present

## 2020-04-04 DIAGNOSIS — E1169 Type 2 diabetes mellitus with other specified complication: Secondary | ICD-10-CM | POA: Diagnosis present

## 2020-04-04 DIAGNOSIS — K922 Gastrointestinal hemorrhage, unspecified: Secondary | ICD-10-CM | POA: Diagnosis present

## 2020-04-04 DIAGNOSIS — R011 Cardiac murmur, unspecified: Secondary | ICD-10-CM

## 2020-04-04 DIAGNOSIS — I129 Hypertensive chronic kidney disease with stage 1 through stage 4 chronic kidney disease, or unspecified chronic kidney disease: Secondary | ICD-10-CM | POA: Diagnosis present

## 2020-04-04 DIAGNOSIS — Z66 Do not resuscitate: Secondary | ICD-10-CM | POA: Diagnosis present

## 2020-04-04 DIAGNOSIS — D62 Acute posthemorrhagic anemia: Secondary | ICD-10-CM | POA: Diagnosis present

## 2020-04-04 DIAGNOSIS — Z20822 Contact with and (suspected) exposure to covid-19: Secondary | ICD-10-CM | POA: Diagnosis present

## 2020-04-04 DIAGNOSIS — E785 Hyperlipidemia, unspecified: Secondary | ICD-10-CM | POA: Diagnosis present

## 2020-04-04 DIAGNOSIS — D128 Benign neoplasm of rectum: Secondary | ICD-10-CM | POA: Diagnosis present

## 2020-04-04 DIAGNOSIS — I10 Essential (primary) hypertension: Secondary | ICD-10-CM | POA: Diagnosis not present

## 2020-04-04 DIAGNOSIS — K2950 Unspecified chronic gastritis without bleeding: Secondary | ICD-10-CM | POA: Diagnosis not present

## 2020-04-04 DIAGNOSIS — E1122 Type 2 diabetes mellitus with diabetic chronic kidney disease: Secondary | ICD-10-CM | POA: Diagnosis present

## 2020-04-04 DIAGNOSIS — D649 Anemia, unspecified: Secondary | ICD-10-CM

## 2020-04-04 DIAGNOSIS — G40909 Epilepsy, unspecified, not intractable, without status epilepticus: Secondary | ICD-10-CM | POA: Diagnosis present

## 2020-04-04 DIAGNOSIS — D120 Benign neoplasm of cecum: Secondary | ICD-10-CM | POA: Diagnosis present

## 2020-04-04 DIAGNOSIS — E1165 Type 2 diabetes mellitus with hyperglycemia: Secondary | ICD-10-CM | POA: Diagnosis present

## 2020-04-04 DIAGNOSIS — K621 Rectal polyp: Secondary | ICD-10-CM | POA: Diagnosis not present

## 2020-04-04 DIAGNOSIS — K635 Polyp of colon: Secondary | ICD-10-CM | POA: Diagnosis not present

## 2020-04-04 DIAGNOSIS — N2581 Secondary hyperparathyroidism of renal origin: Secondary | ICD-10-CM | POA: Diagnosis present

## 2020-04-04 DIAGNOSIS — E1149 Type 2 diabetes mellitus with other diabetic neurological complication: Secondary | ICD-10-CM | POA: Diagnosis present

## 2020-04-04 DIAGNOSIS — N179 Acute kidney failure, unspecified: Secondary | ICD-10-CM | POA: Diagnosis present

## 2020-04-04 DIAGNOSIS — K648 Other hemorrhoids: Secondary | ICD-10-CM | POA: Diagnosis present

## 2020-04-04 LAB — GLUCOSE, CAPILLARY
Glucose-Capillary: 132 mg/dL — ABNORMAL HIGH (ref 70–99)
Glucose-Capillary: 129 mg/dL — ABNORMAL HIGH (ref 70–99)
Glucose-Capillary: 137 mg/dL — ABNORMAL HIGH (ref 70–99)
Glucose-Capillary: 164 mg/dL — ABNORMAL HIGH (ref 70–99)
Glucose-Capillary: 158 mg/dL — ABNORMAL HIGH (ref 70–99)

## 2020-04-04 LAB — CBC
HCT: 28.6 % — ABNORMAL LOW (ref 36.0–46.0)
Hemoglobin: 8.2 g/dL — ABNORMAL LOW (ref 12.0–15.0)
MCH: 25.7 pg — ABNORMAL LOW (ref 26.0–34.0)
MCHC: 28.7 g/dL — ABNORMAL LOW (ref 30.0–36.0)
MCV: 89.7 fL (ref 80.0–100.0)
Platelets: 292 K/uL (ref 150–400)
RBC: 3.19 MIL/uL — ABNORMAL LOW (ref 3.87–5.11)
RDW: 20.7 % — ABNORMAL HIGH (ref 11.5–15.5)
WBC: 5.2 K/uL (ref 4.0–10.5)
nRBC: 1.4 % — ABNORMAL HIGH (ref 0.0–0.2)

## 2020-04-04 LAB — TYPE AND SCREEN
ABO/RH(D): AB POS
Antibody Screen: NEGATIVE
Unit division: 0
Unit division: 0

## 2020-04-04 LAB — COMPREHENSIVE METABOLIC PANEL WITH GFR
ALT: 14 U/L (ref 0–44)
AST: 14 U/L — ABNORMAL LOW (ref 15–41)
Albumin: 3.6 g/dL (ref 3.5–5.0)
Alkaline Phosphatase: 41 U/L (ref 38–126)
Anion gap: 12 (ref 5–15)
BUN: 66 mg/dL — ABNORMAL HIGH (ref 8–23)
CO2: 18 mmol/L — ABNORMAL LOW (ref 22–32)
Calcium: 9.1 mg/dL (ref 8.9–10.3)
Chloride: 113 mmol/L — ABNORMAL HIGH (ref 98–111)
Creatinine, Ser: 2.27 mg/dL — ABNORMAL HIGH (ref 0.44–1.00)
GFR, Estimated: 21 mL/min — ABNORMAL LOW
Glucose, Bld: 141 mg/dL — ABNORMAL HIGH (ref 70–99)
Potassium: 3.7 mmol/L (ref 3.5–5.1)
Sodium: 143 mmol/L (ref 135–145)
Total Bilirubin: 1.2 mg/dL (ref 0.3–1.2)
Total Protein: 5.7 g/dL — ABNORMAL LOW (ref 6.5–8.1)

## 2020-04-04 LAB — BPAM RBC
Blood Product Expiration Date: 202203072359
Blood Product Expiration Date: 202203072359
ISSUE DATE / TIME: 202202141651
ISSUE DATE / TIME: 202202142107
Unit Type and Rh: 6200
Unit Type and Rh: 6200

## 2020-04-04 LAB — PROTIME-INR
INR: 1.4 — ABNORMAL HIGH (ref 0.8–1.2)
Prothrombin Time: 16.8 seconds — ABNORMAL HIGH (ref 11.4–15.2)

## 2020-04-04 MED ORDER — ENSURE MAX PROTEIN PO LIQD
11.0000 [oz_av] | Freq: Two times a day (BID) | ORAL | Status: DC
  Administered 2020-04-05: 11 [oz_av] via ORAL
  Filled 2020-04-04 (×5): qty 330

## 2020-04-04 MED ORDER — PANTOPRAZOLE SODIUM 40 MG PO TBEC
40.0000 mg | DELAYED_RELEASE_TABLET | Freq: Two times a day (BID) | ORAL | Status: DC
  Administered 2020-04-04 – 2020-04-05 (×4): 40 mg via ORAL
  Filled 2020-04-04 (×4): qty 1

## 2020-04-04 MED ORDER — HYDROCORTISONE (PERIANAL) 2.5 % EX CREA
TOPICAL_CREAM | Freq: Two times a day (BID) | CUTANEOUS | Status: DC
  Filled 2020-04-04 (×2): qty 28.35

## 2020-04-04 MED ORDER — POLYETHYLENE GLYCOL 3350 17 G PO PACK
17.0000 g | PACK | Freq: Every day | ORAL | Status: DC
  Administered 2020-04-04 – 2020-04-05 (×2): 17 g via ORAL
  Filled 2020-04-04 (×2): qty 1

## 2020-04-04 MED ORDER — FUROSEMIDE 10 MG/ML IJ SOLN
40.0000 mg | Freq: Once | INTRAMUSCULAR | Status: AC
  Administered 2020-04-04: 40 mg via INTRAVENOUS
  Filled 2020-04-04: qty 4

## 2020-04-04 NOTE — Progress Notes (Addendum)
PROGRESS NOTE  [name] [name] KXF:[id number] DOB: [birth year] DOA: 04/03/2020 PCP: Unk Pinto, MD   LOS: 0 days   Brief narrative:  [name] [name] is a 87 y.o. female with past medical history of anemia, chronic kidney disease, hypothyroidism, hyperlipidemia, hypertension, diabetes mellitus, heart murmur presented to hospital with complaints of lightheadedness, shortness of breath on exertion.  Patient had a blood work done today which was noted to have hemoglobin of 6.4 and the patient was transferred to our hospital.  She did have history of transfusion back in September 2021.  Patient reported some dark stools but has history of hemorrhoids.  She did have colonoscopy in 2010 with a single polyp which was removed.  Patient does not admit to using Eliquis takes baby aspirin.  In the ED, patient did have symptomatic anemia.  BNP was elevated at 500.  Hemoglobin was 6.2.  Fecal occult was positive.  Patient was ordered 2 units of packed RBC and was considered for admission to the hospital.  [facility] was notified for potential endoscopic intervention.  Assessment/Plan:  Principal Problem:   Symptomatic anemia Active Problems:   Hypothyroidism   Gastroesophageal reflux disease   Essential hypertension   Hyperlipidemia associated with type 2 diabetes mellitus (HCC)   Type II diabetes mellitus with neurological manifestations ([place])   Type 2 diabetes mellitus with stage 4 chronic kidney disease, with long-term current use of insulin (HCC)  Symptomatic anemia.  Hemoccult was positive.    Dyspnea and shortness of breath.  Patient received 2 units of packed RBC.  GI has been notified.  History of colonoscopy with findings of solitary colonic polyp 2010.  Seen by [facility] in the past. History of hemorrhoids as per the patient.  Will hold aspirin from home.    Will give 1 dose of IV Lasix 40mg  today. CBC Latest Ref Rng & Units 04/04/2020 04/03/2020 02/02/2020  WBC 4.0 - 10.5 K/uL 5.2 5.2 6.2   Hemoglobin 12.0 - 15.0 g/dL 8.2(L) 6.2(LL) 10.9(L)  Hematocrit 36.0 - 46.0 % 28.6(L) 24.0(L) 35.2  Platelets 150 - 400 K/uL 292 365 332    Type 2 diabetes mellitus.  Patient is on NPH at home.  Will continue on sliding scale insulin, Accu-Cheks, Lantus. on  10 units of Lantus at nighttime.  Low-dose sliding scale for now since patient is  kept n.p.o. after midnight.  Insulin as necessary.  CKD stage IV.  We will continue to monitor closely.  Check BMP in a.m.  Peripheral edema shortness of breath could be secondary to anemia, CKD,.  Patient takes Lasix at home.   2D echocardiogram from 09/18/2013 showed preserved LV function.  Pending echocardiogram this time.  Give 1 dose of IV Lasix today.  GERD.  Continue PPI.  Essential hypertension, murmer Continue atenolol.  Continue monitor blood pressure.  Latest blood pressure 127/45. Check echo  History of seizure.  On Keppra.  Restless leg syndrome.  Continue ropinirole.  Hypothyroidism.  Continue Synthroid.  DVT prophylaxis: SCDs Start: 04/03/20 1742  Code Status: Do not resuscitate  Family Communication: Spoke with the patient's son yesterday  Status is: Observation  The patient will require care spanning > 2 midnights and should be moved to inpatient because: IV treatments appropriate due to intensity of illness or inability to take PO, Inpatient level of care appropriate due to severity of illness and possible need for endoscopic evaluation, pending GI evaluation  Dispo: The patient is from: Home  Anticipated d/c is to: Home              Anticipated d/c date is: 2 days              Patient currently is not medically stable to d/c.   Difficult to place patient No   Consultants:  GI  Procedures:  Transfusion of packed RBC  Anti-infectives:  . None  Anti-infectives (From admission, onward)   None     Subjective: Today, patient was seen and examined at bedside.  Today, complains of shortness of  breath, fatigue and weakness but  feels little better  Objective: Vitals:   04/04/20 0430 04/04/20 0802  BP: (!) 136/48 (!) 127/45  Pulse: 70 72  Resp: 20 16  Temp: 97.6 F (36.4 C) 98.2 F (36.8 C)  SpO2: 92% (!) 89%    Intake/Output Summary (Last 24 hours) at 04/04/2020 0921 Last data filed at 04/04/2020 0057 Gross per 24 hour  Intake 1475 ml  Output --  Net 1475 ml   Filed Weights   04/03/20 1510 04/03/20 2041  Weight: 88 kg 87.1 kg   Body mass index is 35.12 kg/m.   Physical Exam: GENERAL: Patient is alert awake and oriented. Not in obvious distress.  Obese lying  in bed HENT: Pallor noted. Pupils equally reactive to light. Oral mucosa is moist NECK: is supple, no gross swelling noted. CHEST:   Diminished breath sounds bilaterally.  No obvious wheezes or crackles. CVS: S1 and S2 heard, systolic murmer loud, Regular rate and rhythm.  ABDOMEN: Soft, non-tender, bowel sounds are present. EXTREMITIES: Bilateral lower extremity edema noted CNS: Cranial nerves are intact. No focal motor deficits. SKIN: warm and dry without rashes.  Data Review: I have personally reviewed the following laboratory data and studies,  CBC: Recent Labs  Lab 04/03/20 1533 04/04/20 0548  WBC 5.2 5.2  NEUTROABS 4.0  --   HGB 6.2* 8.2*  HCT 24.0* 28.6*  MCV 93.4 89.7  PLT 365 825   Basic Metabolic Panel: Recent Labs  Lab 04/03/20 1533 04/04/20 0548  NA 141 143  K 3.8 3.7  CL 110 113*  CO2 18* 18*  GLUCOSE 164* 141*  BUN 79* 66*  CREATININE 2.49* 2.27*  CALCIUM 9.2 9.1   Liver Function Tests: Recent Labs  Lab 04/03/20 1533 04/04/20 0548  AST 28 14*  ALT 15 14  ALKPHOS 42 41  BILITOT 0.9 1.2  PROT 6.8 5.7*  ALBUMIN 4.1 3.6   No results for input(s): LIPASE, AMYLASE in the last 168 hours. No results for input(s): AMMONIA in the last 168 hours. Cardiac Enzymes: No results for input(s): CKTOTAL, CKMB, CKMBINDEX, TROPONINI in the last 168 hours. BNP (last 3  results) Recent Labs    04/03/20 1533  BNP 500.8*    ProBNP (last 3 results) No results for input(s): PROBNP in the last 8760 hours.  CBG: Recent Labs  Lab 04/03/20 2118 04/04/20 0724  GLUCAP 132* 129*   Recent Results (from the past 240 hour(s))  Resp Panel by RT-PCR (Flu A&B, Covid) Nasopharyngeal Swab     Status: None   Collection Time: 04/03/20  4:33 PM   Specimen: Nasopharyngeal Swab; Nasopharyngeal(NP) swabs in vial transport medium  Result Value Ref Range Status   SARS Coronavirus 2 by RT PCR NEGATIVE NEGATIVE Final    Comment: (NOTE) SARS-CoV-2 target nucleic acids are NOT DETECTED.  The SARS-CoV-2 RNA is generally detectable in upper respiratory specimens during the acute phase of infection. The  lowest concentration of SARS-CoV-2 viral copies this assay can detect is 138 copies/mL. A negative result does not preclude SARS-Cov-2 infection and should not be used as the sole basis for treatment or other patient management decisions. A negative result may occur with  improper specimen collection/handling, submission of specimen other than nasopharyngeal swab, presence of viral mutation(s) within the areas targeted by this assay, and inadequate number of viral copies(<138 copies/mL). A negative result must be combined with clinical observations, patient history, and epidemiological information. The expected result is Negative.  Fact Sheet for Patients:  EntrepreneurPulse.com.au  Fact Sheet for Healthcare Providers:  IncredibleEmployment.be  This test is no t yet approved or cleared by the [place] FDA and  has been authorized for detection and/or diagnosis of SARS-CoV-2 by FDA under an Emergency Use Authorization (EUA). This EUA will remain  in effect (meaning this test can be used) for the duration of the COVID-19 declaration under Section 564(b)(1) of the Act, 21 U.S.C.section 360bbb-3(b)(1), unless the authorization is  terminated  or revoked sooner.       Influenza A by PCR NEGATIVE NEGATIVE Final   Influenza B by PCR NEGATIVE NEGATIVE Final    Comment: (NOTE) The Xpert Xpress SARS-CoV-2/FLU/RSV plus assay is intended as an aid in the diagnosis of influenza from Nasopharyngeal swab specimens and should not be used as a sole basis for treatment. Nasal washings and aspirates are unacceptable for Xpert Xpress SARS-CoV-2/FLU/RSV testing.  Fact Sheet for Patients: EntrepreneurPulse.com.au  Fact Sheet for Healthcare Providers: IncredibleEmployment.be  This test is not yet approved or cleared by the [place] FDA and has been authorized for detection and/or diagnosis of SARS-CoV-2 by FDA under an Emergency Use Authorization (EUA). This EUA will remain in effect (meaning this test can be used) for the duration of the COVID-19 declaration under Section 564(b)(1) of the Act, 21 U.S.C. section 360bbb-3(b)(1), unless the authorization is terminated or revoked.  Performed at [facility], [place] [address] [place], [place] [address]      Studies: DG Chest Portable 1 View  Result Date: 04/03/2020 CLINICAL DATA:  Shortness of breath EXAM: PORTABLE CHEST 1 VIEW COMPARISON:  11/16/2017 FINDINGS: Probable tiny pleural effusions. Borderline cardiomegaly with vascular congestion and probable mild pulmonary edema. Aortic atherosclerosis. No pneumothorax. IMPRESSION: Borderline cardiomegaly with vascular congestion and mild pulmonary edema. Probable small pleural effusions. Electronically Signed   By: Donavan Foil M.D.   On: 04/03/2020 16:24      Flora Lipps, MD  [facility] 04/04/2020  If 7PM-7AM, please contact night-coverage

## 2020-04-04 NOTE — H&P (View-Only) (Signed)
Referring Provider:  [facility]         Primary Care Physician:  Unk Pinto, MD Primary Gastroenterologist:  Scarlette Shorts, MD ( remote)         We were asked to see this patient for:     Anemia, heme positive stool             ASSESSMENT / PLAN:    # 87 yo female with symptomatic acute on chronic anemia, new heme positive stool (multiple prior hemoccults in 2019, 2019, 2020 and 2021 were negative). Intermittent black stools over last several weeks.  Baseline hgb around 11, down to 6.2 yesterday. Improved to 8.2 following 2 units of PRBCs. Takes takes baby asa, no other NSAIDs. Rule out PUD, erosive disease, AVMs.  --Followed outpatient by Hematology, Anemia felt to be secondary and functional iron deficiency. MDS not ruled out. Last iron infusion was November 2021. --Patient needs an EGD. The risks and benefits of EGD were discussed and the patient agrees to proceed. No availability in Endoscopy to get procedure done today so will give clears and make NPO after MN.  --I do have some concerns about her breathing. Even after blood transfusion she got quite SOB during my exam though it did require her to pull herself forward in the bed. CXR shows mild pulmonary edema. She is getting lasix  # Hx of GERD. She describes globus sensation over last several days. On Prevacid at home.  --Continue PPI, given intermittent globus, black and heme + stool will increase to BID for now  # Large hemorrhoids with occasional minor rectal bleeding.  --Anusol cream PR BID  # Constipation --Manages at home with fleets suppositories as needed.  --Start Miralax once daily  # Remote adenomatous colon polyps in 2010. No colonoscopy since, probably age out of surveillance     HPI:                                                                                                                             Chief Complaint:  Anemia  [name] [name] is a 87 y.o. female with HTN, hyperlipidemia , DM,  morbid obesity , secondary hyperparathyroidism, hypothyroidism, CKD stage IV , seizure disorder adenomatous colon polyps, IDA, GERD  Patient presented to the ED yesterday after being told she needed evaluation for anemia.  Her hemoglobin was 6.4.  She has a history of anemia requiring transfusions.   Patient's stools are sometimes black, other times brown.  She is not on iron at home.  She occasionally has a small amount of bright red blood in stool but gives a history of hemorrhoids.  She takes a daily baby aspirin  In the ED she was hemodynamically stable.  Her hemoglobin was 6.2, down from baseline around 11.  MCV 89, platelets 292 . BUN elevated at 66 but she has CKD and her creatinine is 2.27.  Her BNP is elevated at 500.  INR 1.4.  Chest x-ray shows borderline cardiomegaly with vascular congestion and mild pulmonary edema.    PREVIOUS ENDOSCOPIC EVALUATIONS / PERTINENT STUDIES   June 2010 EGD for evaluation of iron deficiency anemia --Benign distal esophageal stricture, otherwise normal exam  June 2010 colonoscopy for polyp surveillance. --4 mm polyp removed Path compatible with tubular adenoma  June 2010 bone marrow biopsy SURGICAL PATHOLOGY   Clinical History: evaluate for MDS, iliac crest, (ADC)   DIAGNOSIS:   BONE MARROW, ASPIRATE, CLOT, CORE:  -Hypercellular bone marrow for age with features concerning for myeloid  neoplasm  -See comment   PERIPHERAL BLOOD:  -Normocytic-hypochromic anemia  -Thrombocytosis   COMMENT:   Despite somewhat limited material, the overall features are concerning  for a myeloproliferative neoplasm particularly primary myelofibrosis or  essential thrombocythemia. Correlation with cytogenetic analysis is  recommended. Additionally, testing for JAK2, CALR, MPL mutations on a  peripheral blood sample will be helpful in this regard.   Past Medical History:  Diagnosis Date   Anemia, unspecified    Arthritis    "knees; right shoulder"  (09/15/2013)   Basal cell carcinoma    "right upper outer lip"   DM neuropathy, type II diabetes mellitus ([place])    Dyspnea 2021   with low iron   GERD (gastroesophageal reflux disease)    Gout    High cholesterol    Hypertension    Hypothyroidism    Meniere's disease    Obesity (BMI 30-39.9)    Other forms of epilepsy and recurrent seizures without mention of intractable epilepsy 11/04/2012   Non convulsive paroxysmal spells, responding to [facility], patient not driving.    Pneumonia ~ 1943   Skin cancer    "forehead; right hand"   Stage 4 chronic kidney disease ([place])    UTI (urinary tract infection)    Vitamin D deficiency     Past Surgical History:  Procedure Laterality Date   CERVICAL POLYPECTOMY     HAMMER TOE SURGERY Left 1980's   MOHS SURGERY  20087   "right upper outer lip"   TONSILLECTOMY  ~ 1947   WRIST SURGERY Left ~ 1950   "ran arm thru window"    Prior to Admission medications   Medication Sig Start Date End Date Taking? Authorizing Provider  acetaminophen (TYLENOL) 500 MG tablet Take 1,000 mg by mouth every 6 (six) hours as needed for moderate pain.   Yes [provider]  allopurinol (ZYLOPRIM) 100 MG tablet Take 1 tablet daily to prevent gout. Patient taking differently: Take 100 mg by mouth daily as needed (gout). 11/17/19  Yes Unk Pinto, MD  aspirin 81 MG tablet Take 81 mg by mouth daily.   Yes [provider]  atenolol (TENORMIN) 50 MG tablet Take     1 tablet     Daily       for BP Patient taking differently: Take 50 mg by mouth daily. 12/27/19  Yes Unk Pinto, MD  cholecalciferol (VITAMIN D3) 25 MCG (1000 UNIT) tablet Take 1,000 Units by mouth daily.   Yes [provider]  fish oil-omega-3 fatty acids 1000 MG capsule Take 1,000 mg by mouth at bedtime.   Yes [provider]  furosemide (LASIX) 40 MG tablet TAKE 1 TABLET BY MOUTH DAILY FOR FLUID RETENTION/ANKLE SWELLING Patient taking  differently: Take 40 mg by mouth daily. 12/27/19  Yes McClanahan, Danton Sewer, NP  insulin NPH-regular Human (HUMULIN 70/30) (70-30) 100 UNIT/ML injection Inject 20 Units into the skin 2 (two) times daily with a meal. 02/02/20  Yes McClanahan, Kyra, NP  lansoprazole (PREVACID) 30 MG capsule Take    1 capsule    Daily       to Prevent Heartburn & Indigestion Patient taking differently: Take 30 mg by mouth at bedtime. 02/13/20  Yes Unk Pinto, MD  levETIRAcetam (KEPPRA) 500 MG tablet Take      1 tablet       3 x /day       with Meals to Prevent Seizures Patient taking differently: Take 500 mg by mouth 3 (three) times daily. 02/13/20  Yes Unk Pinto, MD  levothyroxine (SYNTHROID) 100 MCG tablet TAKE 1 TABLET BY MOUTH DAILY ON AN EMPTY STOMACH WITH ONLY WATER FOR 30 MINUTES. NO ANTACIDS, CALCIUM OR MAGNESIUM FOR 4 HOURS. AVOID BIOTIN Patient taking differently: Take 100 mcg by mouth daily before breakfast. 01/24/20  Yes Unk Pinto, MD  loratadine (CLARITIN) 10 MG tablet Take 10 mg by mouth daily.   Yes [provider]  meclizine (ANTIVERT) 25 MG tablet Take     1 tablet     3 x /day     Only if needed for Dizziness or Vertigo Patient taking differently: Take 25 mg by mouth daily as needed for dizziness or nausea. Take     1 tablet     3 x /day     Only if needed for Dizziness or Vertigo 11/23/19  Yes Unk Pinto, MD  rOPINIRole (REQUIP) 1 MG tablet Take       1/2 to 1 tablet        3 x /day         as needed for Restless Legs & Cramps Patient taking differently: Take 1 mg by mouth 3 (three) times daily. 02/13/20  Yes Unk Pinto, MD  vitamin B-12 (CYANOCOBALAMIN) 100 MCG tablet Take 100 mcg by mouth daily.    Yes [provider]  vitamin C (ASCORBIC ACID) 500 MG tablet Take 1,000 mg by mouth 3 (three) times daily.    Yes [provider]  glucose blood (ONETOUCH VERIO) test strip Check blood sugar 3 times daily-[id number] 04/05/19   Unk Pinto, MD  Lancets  [facility] ULTRASOFT) lancets Insulin dependent check sugars 2-3 x a day- 3 month supply 09/15/19   Vladimir Crofts, PA-C    Current Facility-Administered Medications  Medication Dose Route Frequency Provider Last Rate Last Admin   0.9 %  sodium chloride infusion  250 mL Intravenous PRN Pokhrel, Laxman, MD       acetaminophen (TYLENOL) tablet 650 mg  650 mg Oral Q6H PRN Pokhrel, Laxman, MD       Or   acetaminophen (TYLENOL) suppository 650 mg  650 mg Rectal Q6H PRN Pokhrel, Laxman, MD       allopurinol (ZYLOPRIM) tablet 100 mg  100 mg Oral Daily Pokhrel, Laxman, MD   100 mg at 04/03/20 2117   atenolol (TENORMIN) tablet 50 mg  50 mg Oral Daily Pokhrel, Laxman, MD   50 mg at 04/03/20 2117   cholecalciferol (VITAMIN D3) tablet 1,000 Units  1,000 Units Oral Daily Pokhrel, Laxman, MD       feeding supplement (BOOST / RESOURCE BREEZE) liquid 1 Container  1 Container Oral TID BM Pokhrel, Laxman, MD       furosemide (LASIX) tablet 40 mg  40 mg Oral Daily Pokhrel, Laxman, MD       insulin aspart (novoLOG) injection 0-9 Units  0-9 Units Subcutaneous TID WC Pokhrel, Laxman, MD   1 Units at 04/04/20 [id number]  insulin aspart (novoLOG) injection 2 Units  2 Units Subcutaneous TID WC Pokhrel, Laxman, MD       insulin detemir (LEVEMIR) injection 10 Units  10 Units Subcutaneous QHS Pokhrel, Laxman, MD   10 Units at 04/03/20 2127   levETIRAcetam (KEPPRA) tablet 500 mg  500 mg Oral TID WC Pokhrel, Laxman, MD   500 mg at 04/03/20 2117   levothyroxine (SYNTHROID) tablet 100 mcg  100 mcg Oral QAC breakfast Pokhrel, Laxman, MD       loratadine (CLARITIN) tablet 10 mg  10 mg Oral Daily Pokhrel, Laxman, MD       meclizine (ANTIVERT) tablet 25 mg  25 mg Oral Daily PRN Pokhrel, Laxman, MD       ondansetron (ZOFRAN) tablet 4 mg  4 mg Oral Q6H PRN Pokhrel, Laxman, MD       Or   ondansetron (ZOFRAN) injection 4 mg  4 mg Intravenous Q6H PRN Pokhrel, Laxman, MD       pantoprazole (PROTONIX) EC tablet 40 mg  40  mg Oral Daily Pokhrel, Laxman, MD   40 mg at 04/03/20 2118   rOPINIRole (REQUIP) tablet 1 mg  1 mg Oral TID Pokhrel, Laxman, MD   1 mg at 04/03/20 2118   sodium chloride flush (NS) 0.9 % injection 3 mL  3 mL Intravenous Q12H Pokhrel, Laxman, MD   3 mL at 04/03/20 2118   sodium chloride flush (NS) 0.9 % injection 3 mL  3 mL Intravenous PRN Pokhrel, Laxman, MD       vitamin B-12 (CYANOCOBALAMIN) tablet 100 mcg  100 mcg Oral Daily Pokhrel, Laxman, MD        Allergies as of 04/03/2020 - Review Complete 04/03/2020  Allergen Reaction Noted   Ppd [tuberculin purified protein derivative] Other (See Comments)     Family History  Problem Relation Age of Onset   Heart disease Mother    Stroke Mother    Heart disease Father    Ovarian cancer Sister    Hypertension Son    Hypertension Son    Diabetes Son    Alcohol abuse Son     Social History   Socioeconomic History   Marital status: Widowed    Spouse name: Not on file   Number of children: 3   Years of education: 22   Highest education level: Not on file  Occupational History   Occupation: retired  Tobacco Use   Smoking status: Former Smoker    Packs/day: 1.00    Years: 30.00    Pack years: 30.00    Types: Cigarettes    Quit date: 06/29/1983    Years since quitting: 36.7   Smokeless tobacco: Never Used  Vaping Use   Vaping Use: Never used  Substance and Sexual Activity   Alcohol use: Yes    Alcohol/week: 0.0 standard drinks    Comment: 09/15/2013 "glass of wine maybe once/year"   Drug use: No   Sexual activity: Not Currently  Other Topics Concern   Not on file  Social History Narrative   Patient lives at home alone and she is widowed.   Retired.   Education some college.   Right handed.   Caffeine sometimes not daily.    Social Determinants of Health   Financial Resource Strain: Not on file  Food Insecurity: Not on file  Transportation Needs: Not on file  Physical Activity: Not on file   Stress: Not on file  Social Connections: Not on file  Intimate Partner Violence: Not  on file    Review of Systems: All systems reviewed and negative except where noted in HPI.    OBJECTIVE:    Physical Exam: Vital signs in last 24 hours: Temp:  [97.5 F (36.4 C)-98.8 F (37.1 C)] 98.2 F (36.8 C) (02/15 0802) Pulse Rate:  [67-78] 72 (02/15 0802) Resp:  [14-20] 16 (02/15 0802) BP: (108-143)/(34-89) 127/45 (02/15 0802) SpO2:  [89 %-99 %] 89 % (02/15 0802) Weight:  [87.1 kg-88 kg] 87.1 kg (02/14 2041) Last BM Date: 04/02/20 General:   Alert  female in NAD Psych:  Pleasant, cooperative. Normal mood and affect. Eyes:  Pupils equal, sclera clear, no icterus.   Conjunctiva pink. Ears:  Normal auditory acuity. Nose:  No deformity, discharge,  or lesions. Neck:  Supple; no masses Lungs:  Clear throughout to auscultation.   No wheezes, crackles, or rhonchi.  Heart:  Regular rate and rhythm; loud murmur, no lower extremity edema Abdomen:  Soft, non-distended, nontender, BS active, no palp mass   Rectal:  Large hemorrhoids. No stool or blood in vault  Msk:  Symmetrical without gross deformities. . Neurologic:  Alert and  oriented x4;  grossly normal neurologically. Skin:  Intact without significant lesions or rashes.  Filed Weights   04/03/20 1510 04/03/20 2041  Weight: 88 kg 87.1 kg     Scheduled inpatient medications  allopurinol  100 mg Oral Daily   atenolol  50 mg Oral Daily   cholecalciferol  1,000 Units Oral Daily   feeding supplement  1 Container Oral TID BM   furosemide  40 mg Oral Daily   insulin aspart  0-9 Units Subcutaneous TID WC   insulin aspart  2 Units Subcutaneous TID WC   insulin detemir  10 Units Subcutaneous QHS   levETIRAcetam  500 mg Oral TID WC   levothyroxine  100 mcg Oral QAC breakfast   loratadine  10 mg Oral Daily   pantoprazole  40 mg Oral Daily   rOPINIRole  1 mg Oral TID   sodium chloride flush  3 mL Intravenous Q12H    vitamin B-12  100 mcg Oral Daily      Intake/Output from previous day: 02/14 0701 - 02/15 0700 In: 1475 [Blood:975; IV Piggyback:500] Out: -  Intake/Output this shift: No intake/output data recorded.   Lab Results: Recent Labs    04/03/20 1533 04/04/20 0548  WBC 5.2 5.2  HGB 6.2* 8.2*  HCT 24.0* 28.6*  PLT 365 292   BMET Recent Labs    04/03/20 1533 04/04/20 0548  NA 141 143  K 3.8 3.7  CL 110 113*  CO2 18* 18*  GLUCOSE 164* 141*  BUN 79* 66*  CREATININE 2.49* 2.27*  CALCIUM 9.2 9.1   LFT Recent Labs    04/04/20 0548  PROT 5.7*  ALBUMIN 3.6  AST 14*  ALT 14  ALKPHOS 41  BILITOT 1.2   PT/INR Recent Labs    04/04/20 0548  LABPROT 16.8*  INR 1.4*   Hepatitis Panel No results for input(s): HEPBSAG, HCVAB, HEPAIGM, HEPBIGM in the last 72 hours.   . CBC Latest Ref Rng & Units 04/04/2020 04/03/2020 02/02/2020  WBC 4.0 - 10.5 K/uL 5.2 5.2 6.2  Hemoglobin 12.0 - 15.0 g/dL 8.2(L) 6.2(LL) 10.9(L)  Hematocrit 36.0 - 46.0 % 28.6(L) 24.0(L) 35.2  Platelets 150 - 400 K/uL 292 365 332    . CMP Latest Ref Rng & Units 04/04/2020 04/03/2020 02/02/2020  Glucose 70 - 99 mg/dL 141(H) 164(H) 207(H)  BUN 8 -  23 mg/dL 66(H) 79(H) 44(H)  Creatinine 0.44 - 1.00 mg/dL 2.27(H) 2.49(H) 1.95(H)  Sodium 135 - 145 mmol/L 143 141 141  Potassium 3.5 - 5.1 mmol/L 3.7 3.8 4.5  Chloride 98 - 111 mmol/L 113(H) 110 109  CO2 22 - 32 mmol/L 18(L) 18(L) 26  Calcium 8.9 - 10.3 mg/dL 9.1 9.2 9.6  Total Protein 6.5 - 8.1 g/dL 5.7(L) 6.8 6.3  Total Bilirubin 0.3 - 1.2 mg/dL 1.2 0.9 0.4  Alkaline Phos 38 - 126 U/L 41 42 -  AST 15 - 41 U/L 14(L) 28 12  ALT 0 - 44 U/L '14 15 9   ' Studies/Results: DG Chest Portable 1 View  Result Date: 04/03/2020 CLINICAL DATA:  Shortness of breath EXAM: PORTABLE CHEST 1 VIEW COMPARISON:  11/16/2017 FINDINGS: Probable tiny pleural effusions. Borderline cardiomegaly with vascular congestion and probable mild pulmonary edema. Aortic atherosclerosis. No  pneumothorax. IMPRESSION: Borderline cardiomegaly with vascular congestion and mild pulmonary edema. Probable small pleural effusions. Electronically Signed   By: Donavan Foil M.D.   On: 04/03/2020 16:24    Principal Problem:   Symptomatic anemia Active Problems:   Hypothyroidism   Gastroesophageal reflux disease   Essential hypertension   Hyperlipidemia associated with type 2 diabetes mellitus ([place])   Type II diabetes mellitus with neurological manifestations ([place])   Type 2 diabetes mellitus with stage 4 chronic kidney disease, with long-term current use of insulin ([facility])    Tye Savoy, NP-C @  04/04/2020, 9:20 AM

## 2020-04-04 NOTE — Consult Note (Signed)
Referring Provider:  [facility]         Primary Care Physician:  Unk Pinto, MD Primary Gastroenterologist:  Scarlette Shorts, MD ( remote)         We were asked to see this patient for:     Anemia, heme positive stool             ASSESSMENT / PLAN:    # 87 yo female with symptomatic acute on chronic anemia, new heme positive stool (multiple prior hemoccults in 2019, 2019, 2020 and 2021 were negative). Intermittent black stools over last several weeks.  Baseline hgb around 11, down to 6.2 yesterday. Improved to 8.2 following 2 units of PRBCs. Takes takes baby asa, no other NSAIDs. Rule out PUD, erosive disease, AVMs.  --Followed outpatient by Hematology, Anemia felt to be secondary and functional iron deficiency. MDS not ruled out. Last iron infusion was November 2021. --Patient needs an EGD. The risks and benefits of EGD were discussed and the patient agrees to proceed. No availability in Endoscopy to get procedure done today so will give clears and make NPO after MN.  --I do have some concerns about her breathing. Even after blood transfusion she got quite SOB during my exam though it did require her to pull herself forward in the bed. CXR shows mild pulmonary edema. She is getting lasix  # Hx of GERD. She describes globus sensation over last several days. On Prevacid at home.  --Continue PPI, given intermittent globus, black and heme + stool will increase to BID for now  # Large hemorrhoids with occasional minor rectal bleeding.  --Anusol cream PR BID  # Constipation --Manages at home with fleets suppositories as needed.  --Start Miralax once daily  # Remote adenomatous colon polyps in 2010. No colonoscopy since, probably age out of surveillance     HPI:                                                                                                                             Chief Complaint:  Anemia  [name] [name] is a 87 y.o. female with HTN, hyperlipidemia , DM,  morbid obesity , secondary hyperparathyroidism, hypothyroidism, CKD stage IV , seizure disorder adenomatous colon polyps, IDA, GERD  Patient presented to the ED yesterday after being told she needed evaluation for anemia.  Her hemoglobin was 6.4.  She has a history of anemia requiring transfusions.   Patient's stools are sometimes black, other times brown.  She is not on iron at home.  She occasionally has a small amount of bright red blood in stool but gives a history of hemorrhoids.  She takes a daily baby aspirin  In the ED she was hemodynamically stable.  Her hemoglobin was 6.2, down from baseline around 11.  MCV 89, platelets 292 . BUN elevated at 66 but she has CKD and her creatinine is 2.27.  Her BNP is elevated at 500.  INR 1.4.  Chest x-ray shows borderline cardiomegaly with vascular congestion and mild pulmonary edema.    PREVIOUS ENDOSCOPIC EVALUATIONS / PERTINENT STUDIES   June 2010 EGD for evaluation of iron deficiency anemia --Benign distal esophageal stricture, otherwise normal exam  June 2010 colonoscopy for polyp surveillance. --4 mm polyp removed Path compatible with tubular adenoma  June 2010 bone marrow biopsy SURGICAL PATHOLOGY   Clinical History: evaluate for MDS, iliac crest, (ADC)   DIAGNOSIS:   BONE MARROW, ASPIRATE, CLOT, CORE:  -Hypercellular bone marrow for age with features concerning for myeloid  neoplasm  -See comment   PERIPHERAL BLOOD:  -Normocytic-hypochromic anemia  -Thrombocytosis   COMMENT:   Despite somewhat limited material, the overall features are concerning  for a myeloproliferative neoplasm particularly primary myelofibrosis or  essential thrombocythemia. Correlation with cytogenetic analysis is  recommended. Additionally, testing for JAK2, CALR, MPL mutations on a  peripheral blood sample will be helpful in this regard.   Past Medical History:  Diagnosis Date  . Anemia, unspecified   . Arthritis    "knees; right shoulder"  (09/15/2013)  . Basal cell carcinoma    "right upper outer lip"  . DM neuropathy, type II diabetes mellitus ([place])   . Dyspnea 2021   with low iron  . GERD (gastroesophageal reflux disease)   . Gout   . High cholesterol   . Hypertension   . Hypothyroidism   . Meniere's disease   . Obesity (BMI 30-39.9)   . Other forms of epilepsy and recurrent seizures without mention of intractable epilepsy 11/04/2012   Non convulsive paroxysmal spells, responding to [facility], patient not driving.   . Pneumonia ~ 1943  . Skin cancer    "forehead; right hand"  . Stage 4 chronic kidney disease ([place])   . UTI (urinary tract infection)   . Vitamin D deficiency     Past Surgical History:  Procedure Laterality Date  . CERVICAL POLYPECTOMY    . HAMMER TOE SURGERY Left 1980's  . MOHS SURGERY  20087   "right upper outer lip"  . TONSILLECTOMY  ~ 1947  . WRIST SURGERY Left ~ 1950   "ran arm thru window"    Prior to Admission medications   Medication Sig Start Date End Date Taking? Authorizing Provider  acetaminophen (TYLENOL) 500 MG tablet Take 1,000 mg by mouth every 6 (six) hours as needed for moderate pain.   Yes [provider]  allopurinol (ZYLOPRIM) 100 MG tablet Take 1 tablet daily to prevent gout. Patient taking differently: Take 100 mg by mouth daily as needed (gout). 11/17/19  Yes Unk Pinto, MD  aspirin 81 MG tablet Take 81 mg by mouth daily.   Yes [provider]  atenolol (TENORMIN) 50 MG tablet Take     1 tablet     Daily       for BP Patient taking differently: Take 50 mg by mouth daily. 12/27/19  Yes Unk Pinto, MD  cholecalciferol (VITAMIN D3) 25 MCG (1000 UNIT) tablet Take 1,000 Units by mouth daily.   Yes [provider]  fish oil-omega-3 fatty acids 1000 MG capsule Take 1,000 mg by mouth at bedtime.   Yes [provider]  furosemide (LASIX) 40 MG tablet TAKE 1 TABLET BY MOUTH DAILY FOR FLUID RETENTION/ANKLE SWELLING Patient taking  differently: Take 40 mg by mouth daily. 12/27/19  Yes McClanahan, Danton Sewer, NP  insulin NPH-regular Human (HUMULIN 70/30) (70-30) 100 UNIT/ML injection Inject 20 Units into the skin 2 (two) times daily with a meal. 02/02/20  Yes McClanahan, Kyra, NP  lansoprazole (PREVACID) 30 MG capsule Take    1 capsule    Daily       to Prevent Heartburn & Indigestion Patient taking differently: Take 30 mg by mouth at bedtime. 02/13/20  Yes Unk Pinto, MD  levETIRAcetam (KEPPRA) 500 MG tablet Take      1 tablet       3 x /day       with Meals to Prevent Seizures Patient taking differently: Take 500 mg by mouth 3 (three) times daily. 02/13/20  Yes Unk Pinto, MD  levothyroxine (SYNTHROID) 100 MCG tablet TAKE 1 TABLET BY MOUTH DAILY ON AN EMPTY STOMACH WITH ONLY WATER FOR 30 MINUTES. NO ANTACIDS, CALCIUM OR MAGNESIUM FOR 4 HOURS. AVOID BIOTIN Patient taking differently: Take 100 mcg by mouth daily before breakfast. 01/24/20  Yes Unk Pinto, MD  loratadine (CLARITIN) 10 MG tablet Take 10 mg by mouth daily.   Yes [provider]  meclizine (ANTIVERT) 25 MG tablet Take     1 tablet     3 x /day     Only if needed for Dizziness or Vertigo Patient taking differently: Take 25 mg by mouth daily as needed for dizziness or nausea. Take     1 tablet     3 x /day     Only if needed for Dizziness or Vertigo 11/23/19  Yes Unk Pinto, MD  rOPINIRole (REQUIP) 1 MG tablet Take       1/2 to 1 tablet        3 x /day         as needed for Restless Legs & Cramps Patient taking differently: Take 1 mg by mouth 3 (three) times daily. 02/13/20  Yes Unk Pinto, MD  vitamin B-12 (CYANOCOBALAMIN) 100 MCG tablet Take 100 mcg by mouth daily.    Yes [provider]  vitamin C (ASCORBIC ACID) 500 MG tablet Take 1,000 mg by mouth 3 (three) times daily.    Yes [provider]  glucose blood (ONETOUCH VERIO) test strip Check blood sugar 3 times daily-[id number] 04/05/19   Unk Pinto, MD  Lancets  [facility] ULTRASOFT) lancets Insulin dependent check sugars 2-3 x a day- 3 month supply 09/15/19   Vladimir Crofts, PA-C    Current Facility-Administered Medications  Medication Dose Route Frequency Provider Last Rate Last Admin  . 0.9 %  sodium chloride infusion  250 mL Intravenous PRN Pokhrel, Laxman, MD      . acetaminophen (TYLENOL) tablet 650 mg  650 mg Oral Q6H PRN Pokhrel, Laxman, MD       Or  . acetaminophen (TYLENOL) suppository 650 mg  650 mg Rectal Q6H PRN Pokhrel, Laxman, MD      . allopurinol (ZYLOPRIM) tablet 100 mg  100 mg Oral Daily Pokhrel, Laxman, MD   100 mg at 04/03/20 2117  . atenolol (TENORMIN) tablet 50 mg  50 mg Oral Daily Pokhrel, Laxman, MD   50 mg at 04/03/20 2117  . cholecalciferol (VITAMIN D3) tablet 1,000 Units  1,000 Units Oral Daily Pokhrel, Laxman, MD      . feeding supplement (BOOST / RESOURCE BREEZE) liquid 1 Container  1 Container Oral TID BM Pokhrel, Laxman, MD      . furosemide (LASIX) tablet 40 mg  40 mg Oral Daily Pokhrel, Laxman, MD      . insulin aspart (novoLOG) injection 0-9 Units  0-9 Units Subcutaneous TID WC Pokhrel, Laxman, MD   1 Units at 04/04/20 0818  .  insulin aspart (novoLOG) injection 2 Units  2 Units Subcutaneous TID WC Pokhrel, Laxman, MD      . insulin detemir (LEVEMIR) injection 10 Units  10 Units Subcutaneous QHS Pokhrel, Laxman, MD   10 Units at 04/03/20 2127  . levETIRAcetam (KEPPRA) tablet 500 mg  500 mg Oral TID WC Pokhrel, Laxman, MD   500 mg at 04/03/20 2117  . levothyroxine (SYNTHROID) tablet 100 mcg  100 mcg Oral QAC breakfast Pokhrel, Laxman, MD      . loratadine (CLARITIN) tablet 10 mg  10 mg Oral Daily Pokhrel, Laxman, MD      . meclizine (ANTIVERT) tablet 25 mg  25 mg Oral Daily PRN Pokhrel, Laxman, MD      . ondansetron (ZOFRAN) tablet 4 mg  4 mg Oral Q6H PRN Pokhrel, Laxman, MD       Or  . ondansetron (ZOFRAN) injection 4 mg  4 mg Intravenous Q6H PRN Pokhrel, Laxman, MD      . pantoprazole (PROTONIX) EC tablet 40 mg  40  mg Oral Daily Pokhrel, Laxman, MD   40 mg at 04/03/20 2118  . rOPINIRole (REQUIP) tablet 1 mg  1 mg Oral TID Pokhrel, Laxman, MD   1 mg at 04/03/20 2118  . sodium chloride flush (NS) 0.9 % injection 3 mL  3 mL Intravenous Q12H Pokhrel, Laxman, MD   3 mL at 04/03/20 2118  . sodium chloride flush (NS) 0.9 % injection 3 mL  3 mL Intravenous PRN Pokhrel, Laxman, MD      . vitamin B-12 (CYANOCOBALAMIN) tablet 100 mcg  100 mcg Oral Daily Pokhrel, Laxman, MD        Allergies as of 04/03/2020 - Review Complete 04/03/2020  Allergen Reaction Noted  . Ppd [tuberculin purified protein derivative] Other (See Comments)     Family History  Problem Relation Age of Onset  . Heart disease Mother   . Stroke Mother   . Heart disease Father   . Ovarian cancer Sister   . Hypertension Son   . Hypertension Son   . Diabetes Son   . Alcohol abuse Son     Social History   Socioeconomic History  . Marital status: Widowed    Spouse name: Not on file  . Number of children: 3  . Years of education: 72  . Highest education level: Not on file  Occupational History  . Occupation: retired  Tobacco Use  . Smoking status: Former Smoker    Packs/day: 1.00    Years: 30.00    Pack years: 30.00    Types: Cigarettes    Quit date: 06/29/1983    Years since quitting: 36.7  . Smokeless tobacco: Never Used  Vaping Use  . Vaping Use: Never used  Substance and Sexual Activity  . Alcohol use: Yes    Alcohol/week: 0.0 standard drinks    Comment: 09/15/2013 "glass of wine maybe once/year"  . Drug use: No  . Sexual activity: Not Currently  Other Topics Concern  . Not on file  Social History Narrative   Patient lives at home alone and she is widowed.   Retired.   Education some college.   Right handed.   Caffeine sometimes not daily.    Social Determinants of Health   Financial Resource Strain: Not on file  Food Insecurity: Not on file  Transportation Needs: Not on file  Physical Activity: Not on file   Stress: Not on file  Social Connections: Not on file  Intimate Partner Violence: Not  on file    Review of Systems: All systems reviewed and negative except where noted in HPI.    OBJECTIVE:    Physical Exam: Vital signs in last 24 hours: Temp:  [97.5 F (36.4 C)-98.8 F (37.1 C)] 98.2 F (36.8 C) (02/15 0802) Pulse Rate:  [67-78] 72 (02/15 0802) Resp:  [14-20] 16 (02/15 0802) BP: (108-143)/(34-89) 127/45 (02/15 0802) SpO2:  [89 %-99 %] 89 % (02/15 0802) Weight:  [87.1 kg-88 kg] 87.1 kg (02/14 2041) Last BM Date: 04/02/20 General:   Alert  female in NAD Psych:  Pleasant, cooperative. Normal mood and affect. Eyes:  Pupils equal, sclera clear, no icterus.   Conjunctiva pink. Ears:  Normal auditory acuity. Nose:  No deformity, discharge,  or lesions. Neck:  Supple; no masses Lungs:  Clear throughout to auscultation.   No wheezes, crackles, or rhonchi.  Heart:  Regular rate and rhythm; loud murmur, no lower extremity edema Abdomen:  Soft, non-distended, nontender, BS active, no palp mass   Rectal:  Large hemorrhoids. No stool or blood in vault  Msk:  Symmetrical without gross deformities. . Neurologic:  Alert and  oriented x4;  grossly normal neurologically. Skin:  Intact without significant lesions or rashes.  Filed Weights   04/03/20 1510 04/03/20 2041  Weight: 88 kg 87.1 kg     Scheduled inpatient medications . allopurinol  100 mg Oral Daily  . atenolol  50 mg Oral Daily  . cholecalciferol  1,000 Units Oral Daily  . feeding supplement  1 Container Oral TID BM  . furosemide  40 mg Oral Daily  . insulin aspart  0-9 Units Subcutaneous TID WC  . insulin aspart  2 Units Subcutaneous TID WC  . insulin detemir  10 Units Subcutaneous QHS  . levETIRAcetam  500 mg Oral TID WC  . levothyroxine  100 mcg Oral QAC breakfast  . loratadine  10 mg Oral Daily  . pantoprazole  40 mg Oral Daily  . rOPINIRole  1 mg Oral TID  . sodium chloride flush  3 mL Intravenous Q12H  .  vitamin B-12  100 mcg Oral Daily      Intake/Output from previous day: 02/14 0701 - 02/15 0700 In: 1475 [Blood:975; IV Piggyback:500] Out: -  Intake/Output this shift: No intake/output data recorded.   Lab Results: Recent Labs    04/03/20 1533 04/04/20 0548  WBC 5.2 5.2  HGB 6.2* 8.2*  HCT 24.0* 28.6*  PLT 365 292   BMET Recent Labs    04/03/20 1533 04/04/20 0548  NA 141 143  K 3.8 3.7  CL 110 113*  CO2 18* 18*  GLUCOSE 164* 141*  BUN 79* 66*  CREATININE 2.49* 2.27*  CALCIUM 9.2 9.1   LFT Recent Labs    04/04/20 0548  PROT 5.7*  ALBUMIN 3.6  AST 14*  ALT 14  ALKPHOS 41  BILITOT 1.2   PT/INR Recent Labs    04/04/20 0548  LABPROT 16.8*  INR 1.4*   Hepatitis Panel No results for input(s): HEPBSAG, HCVAB, HEPAIGM, HEPBIGM in the last 72 hours.   . CBC Latest Ref Rng & Units 04/04/2020 04/03/2020 02/02/2020  WBC 4.0 - 10.5 K/uL 5.2 5.2 6.2  Hemoglobin 12.0 - 15.0 g/dL 8.2(L) 6.2(LL) 10.9(L)  Hematocrit 36.0 - 46.0 % 28.6(L) 24.0(L) 35.2  Platelets 150 - 400 K/uL 292 365 332    . CMP Latest Ref Rng & Units 04/04/2020 04/03/2020 02/02/2020  Glucose 70 - 99 mg/dL 141(H) 164(H) 207(H)  BUN 8 -  23 mg/dL 66(H) 79(H) 44(H)  Creatinine 0.44 - 1.00 mg/dL 2.27(H) 2.49(H) 1.95(H)  Sodium 135 - 145 mmol/L 143 141 141  Potassium 3.5 - 5.1 mmol/L 3.7 3.8 4.5  Chloride 98 - 111 mmol/L 113(H) 110 109  CO2 22 - 32 mmol/L 18(L) 18(L) 26  Calcium 8.9 - 10.3 mg/dL 9.1 9.2 9.6  Total Protein 6.5 - 8.1 g/dL 5.7(L) 6.8 6.3  Total Bilirubin 0.3 - 1.2 mg/dL 1.2 0.9 0.4  Alkaline Phos 38 - 126 U/L 41 42 -  AST 15 - 41 U/L 14(L) 28 12  ALT 0 - 44 U/L '14 15 9   ' Studies/Results: DG Chest Portable 1 View  Result Date: 04/03/2020 CLINICAL DATA:  Shortness of breath EXAM: PORTABLE CHEST 1 VIEW COMPARISON:  11/16/2017 FINDINGS: Probable tiny pleural effusions. Borderline cardiomegaly with vascular congestion and probable mild pulmonary edema. Aortic atherosclerosis. No  pneumothorax. IMPRESSION: Borderline cardiomegaly with vascular congestion and mild pulmonary edema. Probable small pleural effusions. Electronically Signed   By: Donavan Foil M.D.   On: 04/03/2020 16:24    Principal Problem:   Symptomatic anemia Active Problems:   Hypothyroidism   Gastroesophageal reflux disease   Essential hypertension   Hyperlipidemia associated with type 2 diabetes mellitus ([place])   Type II diabetes mellitus with neurological manifestations ([place])   Type 2 diabetes mellitus with stage 4 chronic kidney disease, with long-term current use of insulin ([facility])    Tye Savoy, NP-C @  04/04/2020, 9:20 AM

## 2020-04-04 NOTE — Progress Notes (Signed)
Initial Nutrition Assessment  DOCUMENTATION CODES:   Obesity unspecified  INTERVENTION:   -Ensure MAX Protein po BID, each supplement provides 150 kcal and 30 grams of protein  -d/c Boost Breeze  NUTRITION DIAGNOSIS:   Increased nutrient needs related to acute illness as evidenced by estimated needs.  GOAL:   Patient will meet greater than or equal to 90% of their needs  MONITOR:   PO intake,Supplement acceptance,Labs,Weight trends,I & O's  REASON FOR ASSESSMENT:   Malnutrition Screening Tool    ASSESSMENT:   87 y.o. female with past medical history of anemia, chronic kidney disease, hypothyroidism, hyperlipidemia, hypertension, diabetes mellitus, heart murmur presented to hospital with complaints of lightheadedness, shortness of breath on exertion.  Patient sleeping during RD visit.  Per chart review, pt has been having black stools for weeks PTA. Per GI note, pt will be scheduled for EGD possible tomorrow. On a solid diet at this time then will be NPO after midnight.  Will switch supplement to Ensure Max, will d/c Boost Breeze.  Per pt report in [facility], pt has lost 25 lbs over the last 3 months. Per weight records, pt has lost 15 lbs since 04/19/19 (7% wt loss x 11.5 months, insignificant for time frame). Her current weight has been stable since August 2021.  Medications: Vitamin D, Lasix, Vitamin B-12  Labs reviewed: CBGs: 129-132  NUTRITION - FOCUSED PHYSICAL EXAM:  Flowsheet Row Most Recent Value  Orbital Region No depletion  Upper Arm Region Mild depletion  Thoracic and Lumbar Region Unable to assess  Buccal Region No depletion  Temple Region No depletion  Clavicle Bone Region No depletion  Clavicle and Acromion Bone Region No depletion  Scapular Bone Region No depletion  Dorsal Hand Moderate depletion  Patellar Region Unable to assess  Anterior Thigh Region Unable to assess  Posterior Calf Region Unable to assess  Edema (RD Assessment) None       Diet  Order:   Diet Order            Diet NPO time specified Except for: Sips with Meds  Diet effective midnight           Diet Carb Modified Fluid consistency: Thin; Room service appropriate? Yes  Diet effective now                 EDUCATION NEEDS:   No education needs have been identified at this time  Skin:  Skin Assessment: Reviewed RN Assessment  Last BM:  2/13  Height:   Ht Readings from Last 1 Encounters:  04/03/20 5\' 2"  (1.575 m)    Weight:   Wt Readings from Last 1 Encounters:  04/03/20 87.1 kg    BMI:  Body mass index is 35.12 kg/m.  Estimated Nutritional Needs:   Kcal:  1500-1700  Protein:  75-90g  Fluid:  1.7L/day   Clayton Bibles, MS, RD, LDN Inpatient Clinical Dietitian Contact information available via Amion

## 2020-04-05 ENCOUNTER — Encounter (HOSPITAL_COMMUNITY): Payer: Self-pay | Admitting: Internal Medicine

## 2020-04-05 ENCOUNTER — Encounter (HOSPITAL_COMMUNITY)
Admission: EM | Disposition: A | Discharge status: Home / Self Care | Payer: Self-pay | Source: Home / Self Care | Attending: Internal Medicine

## 2020-04-05 ENCOUNTER — Inpatient Hospital Stay (HOSPITAL_COMMUNITY): Payer: Medicare Other | Admitting: Anesthesiology

## 2020-04-05 DIAGNOSIS — D62 Acute posthemorrhagic anemia: Secondary | ICD-10-CM

## 2020-04-05 DIAGNOSIS — D649 Anemia, unspecified: Secondary | ICD-10-CM | POA: Diagnosis not present

## 2020-04-05 DIAGNOSIS — K2950 Unspecified chronic gastritis without bleeding: Secondary | ICD-10-CM

## 2020-04-05 DIAGNOSIS — K921 Melena: Secondary | ICD-10-CM

## 2020-04-05 HISTORY — PX: ESOPHAGOGASTRODUODENOSCOPY (EGD) WITH PROPOFOL: SHX5813

## 2020-04-05 HISTORY — PX: BIOPSY: SHX5522

## 2020-04-05 LAB — BASIC METABOLIC PANEL
Anion gap: 12 (ref 5–15)
BUN: 60 mg/dL — ABNORMAL HIGH (ref 8–23)
CO2: 18 mmol/L — ABNORMAL LOW (ref 22–32)
Calcium: 9.2 mg/dL (ref 8.9–10.3)
Chloride: 113 mmol/L — ABNORMAL HIGH (ref 98–111)
Creatinine, Ser: 2.48 mg/dL — ABNORMAL HIGH (ref 0.44–1.00)
GFR, Estimated: 19 mL/min — ABNORMAL LOW (ref >60)
Glucose, Bld: 159 mg/dL — ABNORMAL HIGH (ref 70–99)
Potassium: 3.8 mmol/L (ref 3.5–5.1)
Sodium: 143 mmol/L (ref 135–145)

## 2020-04-05 LAB — CBC
HCT: 27.7 % — ABNORMAL LOW (ref 36.0–46.0)
Hemoglobin: 7.9 g/dL — ABNORMAL LOW (ref 12.0–15.0)
MCH: 26 pg (ref 26.0–34.0)
MCHC: 28.5 g/dL — ABNORMAL LOW (ref 30.0–36.0)
MCV: 91.1 fL (ref 80.0–100.0)
Platelets: 282 10*3/uL (ref 150–400)
RBC: 3.04 MIL/uL — ABNORMAL LOW (ref 3.87–5.11)
RDW: 20.6 % — ABNORMAL HIGH (ref 11.5–15.5)
WBC: 4.4 10*3/uL (ref 4.0–10.5)
nRBC: 0.9 % — ABNORMAL HIGH (ref 0.0–0.2)

## 2020-04-05 LAB — GLUCOSE, CAPILLARY
Glucose-Capillary: 142 mg/dL — ABNORMAL HIGH (ref 70–99)
Glucose-Capillary: 136 mg/dL — ABNORMAL HIGH (ref 70–99)
Glucose-Capillary: 155 mg/dL — ABNORMAL HIGH (ref 70–99)
Glucose-Capillary: 143 mg/dL — ABNORMAL HIGH (ref 70–99)

## 2020-04-05 LAB — PHOSPHORUS: Phosphorus: 4.4 mg/dL (ref 2.5–4.6)

## 2020-04-05 LAB — MAGNESIUM: Magnesium: 2.2 mg/dL (ref 1.7–2.4)

## 2020-04-05 LAB — HEMOGLOBIN A1C
Hgb A1c MFr Bld: 5.4 % (ref 4.8–5.6)
Mean Plasma Glucose: 108 mg/dL

## 2020-04-05 SURGERY — ESOPHAGOGASTRODUODENOSCOPY (EGD) WITH PROPOFOL
Anesthesia: Monitor Anesthesia Care

## 2020-04-05 MED ORDER — LACTATED RINGERS IV SOLN: INTRAVENOUS | Status: DC | PRN

## 2020-04-05 MED ORDER — PEG-KCL-NACL-NASULF-NA ASC-C 100 G PO SOLR
0.5000 | Freq: Once | ORAL | Status: AC
  Administered 2020-04-06: 100 g via ORAL

## 2020-04-05 MED ORDER — PEG-KCL-NACL-NASULF-NA ASC-C 100 G PO SOLR
0.5000 | Freq: Once | ORAL | Status: AC
  Administered 2020-04-05: 100 g via ORAL
  Filled 2020-04-05: qty 1

## 2020-04-05 MED ORDER — LIDOCAINE 2% (20 MG/ML) 5 ML SYRINGE
INTRAMUSCULAR | Status: DC | PRN
  Administered 2020-04-05: 40 mg via INTRAVENOUS

## 2020-04-05 MED ORDER — PROPOFOL 10 MG/ML IV BOLUS
INTRAVENOUS | Status: DC | PRN
  Administered 2020-04-05 (×2): 20 mg via INTRAVENOUS
  Administered 2020-04-05: 30 mg via INTRAVENOUS
  Administered 2020-04-05 (×3): 20 mg via INTRAVENOUS

## 2020-04-05 MED ORDER — PEG-KCL-NACL-NASULF-NA ASC-C 100 G PO SOLR: 1.0000 | Freq: Once | ORAL | Status: DC

## 2020-04-05 SURGICAL SUPPLY — 14 items

## 2020-04-05 NOTE — Op Note (Signed)
[facility] Patient Name: [name] [name] Procedure Date: 04/05/2020 MRN: [id number] Attending MD: Jerene Bears , MD Date of Birth: 1932/08/10 CSN: [id number] Age: 87 Admit Type: Inpatient Procedure:                Upper GI endoscopy Indications:              Acute post hemorrhagic anemia, Heme positive,                            black, stools Providers:                [name]. Hilarie Fredrickson, MD, Particia Nearing, RN, Tyna Jaksch                            Technician Referring MD:             [facility] Medicines:                Monitored Anesthesia Care Complications:            No immediate complications. Estimated Blood Loss:     Estimated blood loss was minimal. Procedure:                Pre-Anesthesia Assessment:                           - Prior to the procedure, a History and Physical                            was performed, and patient medications and                            allergies were reviewed. The patient's tolerance of                            previous anesthesia was also reviewed. The risks                            and benefits of the procedure and the sedation                            options and risks were discussed with the patient.                            All questions were answered, and informed consent                            was obtained. Prior Anticoagulants: The patient has                            taken no previous anticoagulant or antiplatelet                            agents. ASA Grade Assessment: III - A patient with  severe systemic disease. After reviewing the risks                            and benefits, the patient was deemed in                            satisfactory condition to undergo the procedure.                           After obtaining informed consent, the endoscope was                            passed under direct vision. Throughout the                            procedure,  the patient's blood pressure, pulse, and                            oxygen saturations were monitored continuously. The                            GIF-H190 ([id number]) Olympus gastroscope was                            introduced through the mouth, and advanced to the                            second part of duodenum. The upper GI endoscopy was                            accomplished without difficulty. The patient                            tolerated the procedure well. Scope In: Scope Out: Findings:      The examined esophagus was normal.      Moderate inflammation characterized by erythema was found in the gastric       antrum. This appears chronic with metaplastic change at the pyloric       channel and antrum. Biopsies were taken with a cold forceps for       histology.      The exam of the stomach was otherwise normal.      Biopsies were taken with a cold forceps in the gastric body for       histology and Helicobacter pylori testing.      The examined duodenum was normal. Impression:               - Normal esophagus.                           - Chronic-appearing gastritis without bleeding.                            Biopsied.                           - Otherwise  normal stomach. Biopsies were taken                            with a cold forceps for histology and Helicobacter                            pylori testing.                           - Normal examined duodenum.                           - No source found for recent GI bleeding and acute                            anemia. Moderate Sedation:      N/A Recommendation:           - Return patient to hospital ward for ongoing care.                           - Clear liquid diet.                           - Continue present medications.                           - Await pathology results.                           - Consider colonoscopy (will discuss with the                            patient) versus CT scan abd/pelvis to  exclude large                            GI tract lesions. Procedure Code(s):        --- Professional ---                           [phone], Esophagogastroduodenoscopy, flexible,                            transoral; with biopsy, single or multiple Diagnosis Code(s):        --- Professional ---                           K29.50, Unspecified chronic gastritis without                            bleeding                           D62, Acute posthemorrhagic anemia                           R19.5, Other fecal abnormalities CPT copyright 2019 [employer]. All rights reserved. The codes documented in this report  are preliminary and upon coder review may  be revised to meet current compliance requirements. Jerene Bears, MD 04/05/2020 1:26:34 PM This report has been signed electronically. Number of Addenda: 0

## 2020-04-05 NOTE — Progress Notes (Addendum)
PROGRESS NOTE  [name] [name]:[id number] DOB: [birth year] DOA: 04/03/2020 PCP: Unk Pinto, MD  HPI/Recap of past 24 hours: [name] [name] a 87 y.o.femalewith past medical history of anemia, chronic kidney disease, hypothyroidism, hyperlipidemia, hypertension, diabetes mellitus, heart murmur presented to hospital with complaints of lightheadedness, shortness of breath on exertion. Patient had a blood work done today which was noted to have hemoglobin of 6.4 and the patient was transferred to our hospital. She did have history of transfusion back in September 2021. Patient reported some dark stools but has history of hemorrhoids. She did have colonoscopy in 2010 with a single polyp which was removed. Patient does not admit to using Eliquistakes baby aspirin. In the ED,patient did have symptomatic anemia.BNP was elevated at 500.Hemoglobin was 6.2.Fecal occult was positive. Patient was ordered 2 units of packed RBC and was considered for admission to the hospital. [facility] was notified for potential endoscopic intervention.  04/05/20: Seen and examined at bedside prior to her endoscopy.  She reports dyspnea with minimal exertion.  Drop in hemoglobin this morning 7.9 from 8.2.  EGD showed chronic appearing gastritis without bleeding.  Recommended to consider colonoscopy versus CT abdomen and pelvis to exclude lower GI tract lesions.  Assessment/Plan: Principal Problem:   Symptomatic anemia Active Problems:   Hypothyroidism   Gastroesophageal reflux disease   Essential hypertension   Hyperlipidemia associated with type 2 diabetes mellitus (HCC)   Type II diabetes mellitus with neurological manifestations ([place])   Type 2 diabetes mellitus with stage 4 chronic kidney disease, with long-term current use of insulin (HCC)   GI bleed   Blood in stool   Melena   Acute blood loss anemia  Symptomatic anemia, post 2 unit PRBCs transfusion, and post EGD on  04/05/2020. Presented with dyspnea, positive FOBT, and hemoglobin of 6.2. Baseline hemoglobin 10 EGD showed chronic appearing gastritis without bleeding.  Recommended to consider colonoscopy versus CT abdomen and pelvis to exclude lower GI tract lesions. Appreciate GI [place]'s assistance. CBC Latest Ref Rng & Units 04/04/2020 04/03/2020 02/02/2020  WBC 4.0 - 10.5 K/uL 5.2 5.2 6.2  Hemoglobin 12.0 - 15.0 g/dL 8.2(L) 6.2(LL) 10.9(L)  Hematocrit 36.0 - 46.0 % 28.6(L) 24.0(L) 35.2  Platelets 150 - 400 K/uL 292 365 332    Gastritis without bleeding, seen on EGD Continue p.o. PPI twice daily  Type 2 diabetes mellitus with transient hyperglycemia. Patient is on NPH at home.  Hemoglobin A1c 5.4 on 04/04/2020 Avoid hypoglycemia.   Continue to monitor CBGs  AKI on CKD stage IV.Baseline creatinine appears to be 2.0 with GFR of 23.  Creatinine 2.4 intake with GFR of 19.  Continue to avoid nephrotoxins.  Monitor urine output.  Repeat renal panel in the morning.    GERD.Continue PPI.  Essential hypertension Continue atenolol. Continue monitor blood pressure.  Latest blood pressure 127/45. Check echo  History of seizure. On Keppra.  Seizure precautions.  Restless leg syndrome.Continue ropinirole.  Hypothyroidism. Continue Synthroid.  Physical debility PT to assess once hemodynamically stable Fall precautions.   DVT prophylaxis: SCDs Start: 04/03/20 1742     Consultants:  GI  Procedures:  Transfusion of packed RBC  Anti-infectives:   None     Code Status: DNR.  Family Communication: None at bedside.  Disposition Plan: Likely will discharge to home once GI signs off.    Status is: Inpatient    Dispo:  Patient From: Home  Planned Disposition: Home  Expected discharge date: 04/06/2020  Medically stable for discharge: No  Objective: Vitals:   04/05/20 1321 04/05/20 1330 04/05/20 1340 04/05/20 1442  BP: 101/74 (!)  132/53  (!) 129/49  Pulse: 70 70 69 67  Resp: (!) 24 (!) 24 (!) 24 16  Temp:    (!) 97.3 F (36.3 C)  TempSrc:    Oral  SpO2: 94% 93% 96% 97%  Weight:      Height:        Intake/Output Summary (Last 24 hours) at 04/05/2020 1814 Last data filed at 04/05/2020 1304 Gross per 24 hour  Intake 500 ml  Output 0 ml  Net 500 ml   Filed Weights   04/03/20 1510 04/03/20 2041  Weight: 88 kg 87.1 kg    Exam:  . General: 87 y.o. year-old female well developed well nourished in no acute distress.  Alert and oriented x3. . Cardiovascular: Regular rate and rhythm with no rubs or gallops.  No thyromegaly or JVD noted.   Marland Kitchen Respiratory: Clear to auscultation with no wheezes or rales. Good inspiratory effort. . Abdomen: Soft nontender nondistended with normal bowel sounds x4 quadrants. . Musculoskeletal: No lower extremity edema. 2/4 pulses in all 4 extremities. . Skin: No ulcerative lesions noted or rashes, . Psychiatry: Mood is appropriate for condition and setting   Data Reviewed: CBC: Recent Labs  Lab 04/03/20 1533 04/04/20 0548 04/05/20 0551  WBC 5.2 5.2 4.4  NEUTROABS 4.0  --   --   HGB 6.2* 8.2* 7.9*  HCT 24.0* 28.6* 27.7*  MCV 93.4 89.7 91.1  PLT 365 292 166   Basic Metabolic Panel: Recent Labs  Lab 04/03/20 1533 04/04/20 0548 04/05/20 0551  NA 141 143 143  K 3.8 3.7 3.8  CL 110 113* 113*  CO2 18* 18* 18*  GLUCOSE 164* 141* 159*  BUN 79* 66* 60*  CREATININE 2.49* 2.27* 2.48*  CALCIUM 9.2 9.1 9.2  MG  --   --  2.2  PHOS  --   --  4.4   GFR: Estimated Creatinine Clearance: 17 mL/min (A) (by C-G formula based on SCr of 2.48 mg/dL (H)). Liver Function Tests: Recent Labs  Lab 04/03/20 1533 04/04/20 0548  AST 28 14*  ALT 15 14  ALKPHOS 42 41  BILITOT 0.9 1.2  PROT 6.8 5.7*  ALBUMIN 4.1 3.6   No results for input(s): LIPASE, AMYLASE in the last 168 hours. No results for input(s): AMMONIA in the last 168 hours. Coagulation Profile: Recent Labs  Lab  04/04/20 0548  INR 1.4*   Cardiac Enzymes: No results for input(s): CKTOTAL, CKMB, CKMBINDEX, TROPONINI in the last 168 hours. BNP (last 3 results) No results for input(s): PROBNP in the last 8760 hours. HbA1C: Recent Labs    04/04/20 0548  HGBA1C 5.4   CBG: Recent Labs  Lab 04/04/20 1731 04/04/20 2136 04/05/20 0736 04/05/20 1159 04/05/20 1701  GLUCAP 164* 158* 142* 136* 155*   Lipid Profile: No results for input(s): CHOL, HDL, LDLCALC, TRIG, CHOLHDL, LDLDIRECT in the last 72 hours. Thyroid Function Tests: No results for input(s): TSH, T4TOTAL, FREET4, T3FREE, THYROIDAB in the last 72 hours. Anemia Panel: No results for input(s): VITAMINB12, FOLATE, FERRITIN, TIBC, IRON, RETICCTPCT in the last 72 hours. Urine analysis:    Component Value Date/Time   COLORURINE YELLOW 11/01/2019 [place] 11/01/2019 1039   LABSPEC 1.010 11/01/2019 1039   PHURINE < OR = 5.0 11/01/2019 1039   GLUCOSEU NEGATIVE 11/01/2019 1039   HGBUR NEGATIVE 11/01/2019 [place] 09/04/2019 1958  KETONESUR NEGATIVE 11/01/2019 1039   PROTEINUR NEGATIVE 11/01/2019 1039   UROBILINOGEN 0.2 06/06/2014 0940   NITRITE NEGATIVE 11/01/2019 1039   LEUKOCYTESUR 2+ (A) 11/01/2019 1039   Sepsis Labs: _0 (procalcitonin:4,lacticidven:4)  ) Recent Results (from the past 240 hour(s))  Resp Panel by RT-PCR (Flu A&B, Covid) Nasopharyngeal Swab     Status: None   Collection Time: 04/03/20  4:33 PM   Specimen: Nasopharyngeal Swab; Nasopharyngeal(NP) swabs in vial transport medium  Result Value Ref Range Status   SARS Coronavirus 2 by RT PCR NEGATIVE NEGATIVE Final    Comment: (NOTE) SARS-CoV-2 target nucleic acids are NOT DETECTED.  The SARS-CoV-2 RNA is generally detectable in upper respiratory specimens during the acute phase of infection. The lowest concentration of SARS-CoV-2 viral copies this assay can detect is 138 copies/mL. A negative result does not preclude  SARS-Cov-2 infection and should not be used as the sole basis for treatment or other patient management decisions. A negative result may occur with  improper specimen collection/handling, submission of specimen other than nasopharyngeal swab, presence of viral mutation(s) within the areas targeted by this assay, and inadequate number of viral copies(<138 copies/mL). A negative result must be combined with clinical observations, patient history, and epidemiological information. The expected result is Negative.  Fact Sheet for Patients:  EntrepreneurPulse.com.au  Fact Sheet for Healthcare Providers:  IncredibleEmployment.be  This test is no t yet approved or cleared by the [place] FDA and  has been authorized for detection and/or diagnosis of SARS-CoV-2 by FDA under an Emergency Use Authorization (EUA). This EUA will remain  in effect (meaning this test can be used) for the duration of the COVID-19 declaration under Section 564(b)(1) of the Act, 21 U.S.C.section 360bbb-3(b)(1), unless the authorization is terminated  or revoked sooner.       Influenza A by PCR NEGATIVE NEGATIVE Final   Influenza B by PCR NEGATIVE NEGATIVE Final    Comment: (NOTE) The Xpert Xpress SARS-CoV-2/FLU/RSV plus assay is intended as an aid in the diagnosis of influenza from Nasopharyngeal swab specimens and should not be used as a sole basis for treatment. Nasal washings and aspirates are unacceptable for Xpert Xpress SARS-CoV-2/FLU/RSV testing.  Fact Sheet for Patients: EntrepreneurPulse.com.au  Fact Sheet for Healthcare Providers: IncredibleEmployment.be  This test is not yet approved or cleared by the [place] FDA and has been authorized for detection and/or diagnosis of SARS-CoV-2 by FDA under an Emergency Use Authorization (EUA). This EUA will remain in effect (meaning this test can be used) for the duration of  the COVID-19 declaration under Section 564(b)(1) of the Act, 21 U.S.C. section 360bbb-3(b)(1), unless the authorization is terminated or revoked.  Performed at [facility], [place] [address], [place], [place] [address]       Studies: No results found.  Scheduled Meds: . allopurinol  100 mg Oral Daily  . atenolol  50 mg Oral Daily  . cholecalciferol  1,000 Units Oral Daily  . furosemide  40 mg Oral Daily  . hydrocortisone   Rectal BID  . insulin aspart  0-9 Units Subcutaneous TID WC  . insulin aspart  2 Units Subcutaneous TID WC  . insulin detemir  10 Units Subcutaneous QHS  . levETIRAcetam  500 mg Oral TID WC  . levothyroxine  100 mcg Oral QAC breakfast  . loratadine  10 mg Oral Daily  . pantoprazole  40 mg Oral BID  . [START ON 04/06/2020] peg 3350 powder  0.5 kit Oral Once  . polyethylene glycol  17 g Oral Daily  . Ensure Max Protein  11 oz Oral BID  . rOPINIRole  1 mg Oral TID  . sodium chloride flush  3 mL Intravenous Q12H  . vitamin B-12  100 mcg Oral Daily    Continuous Infusions: . sodium chloride       LOS: 1 day     Kayleen Memos, MD [facility] Pager [phone]  If 7PM-7AM, please contact night-coverage [email or web address] Password [facility] 04/05/2020, 6:14 PM

## 2020-04-05 NOTE — Anesthesia Procedure Notes (Signed)
Procedure Name: MAC Date/Time: 04/05/2020 12:45 PM Performed by: Cynda Familia, CRNA Pre-anesthesia Checklist: Patient identified, Emergency Drugs available, Suction available, Patient being monitored and Timeout performed Patient Re-evaluated:Patient Re-evaluated prior to induction Oxygen Delivery Method: Simple face mask Placement Confirmation: positive ETCO2 and breath sounds checked- equal and bilateral Dental Injury: Teeth and Oropharynx as per pre-operative assessment  Comments: Bite block by rn

## 2020-04-05 NOTE — Transfer of Care (Signed)
Immediate Anesthesia Transfer of Care Note  Patient: [name] [name]  Procedure(s) Performed: ESOPHAGOGASTRODUODENOSCOPY (EGD) WITH PROPOFOL (N/A ) BIOPSY  Patient Location: PACU and Endoscopy Unit  Anesthesia Type:MAC  Level of Consciousness: awake and alert   Airway & Oxygen Therapy: Patient Spontanous Breathing and Patient connected to face mask oxygen  Post-op Assessment: Report given to RN and Post -op Vital signs reviewed and stable  Post vital signs: Reviewed and stable  Last Vitals:  Vitals Value Taken Time  BP    Temp    Pulse 68 04/05/20 1311  Resp 22 04/05/20 1311  SpO2 95 % 04/05/20 1311  Vitals shown include unvalidated device data.  Last Pain:  Vitals:   04/05/20 1223  TempSrc: Oral  PainSc: 0-No pain         Complications: No complications documented.

## 2020-04-05 NOTE — Anesthesia Postprocedure Evaluation (Signed)
Anesthesia Post Note  Patient: [name] [name]  Procedure(s) Performed: ESOPHAGOGASTRODUODENOSCOPY (EGD) WITH PROPOFOL (N/A ) BIOPSY     Patient location during evaluation: PACU Anesthesia Type: MAC Level of consciousness: awake and alert Pain management: pain level controlled Vital Signs Assessment: post-procedure vital signs reviewed and stable Respiratory status: spontaneous breathing, nonlabored ventilation and respiratory function stable Cardiovascular status: blood pressure returned to baseline and stable Postop Assessment: no apparent nausea or vomiting Anesthetic complications: no   No complications documented.  Last Vitals:  Vitals:   04/05/20 1330 04/05/20 1340  BP: (!) 132/53   Pulse: 70 69  Resp: (!) 24 (!) 24  Temp:    SpO2: 93% 96%    Last Pain:  Vitals:   04/05/20 1330  TempSrc:   PainSc: 0-No pain                 Pervis Hocking

## 2020-04-05 NOTE — Anesthesia Preprocedure Evaluation (Addendum)
Anesthesia Evaluation  Patient identified by MRN, date of birth, ID band Patient awake    Reviewed: Allergy & Precautions, NPO status , Patient's Chart, lab work & pertinent test results, reviewed documented beta blocker date and time   Airway Mallampati: II  TM Distance: >3 FB Neck ROM: Full    Dental no notable dental hx. (+) Teeth Intact, Partial Upper,    Pulmonary shortness of breath, with exertion and at rest, sleep apnea (does not use CPAP) , former smoker,  Quit smoking 1985, 30 pack year history  Uses oxygen intermittently while in the hospital, not at home   Pulmonary exam normal  + decreased breath sounds      Cardiovascular hypertension, Pt. on medications and Pt. on home beta blockers Normal cardiovascular exam Rhythm:Regular Rate:Normal  Echo 2015: - Left ventricle: The cavity size was normal. Wall thickness was  increased in a pattern of moderate LVH. There was mild concentric  hypertrophy. Systolic function was normal. The estimated ejection  fraction was in the range of 60% to 65%. Wall motion was normal;  there were no regional wall motion abnormalities.  - Aortic valve: Mildly thickened, mildly calcified leaflets. Valve  area (VTI): 1.3 cm^2. Valve area (Vmax): 1.29 cm^2.  - Pulmonary arteries: Systolic pressure was mildly increased. PA  peak pressure: 36 mm Hg (S).      Neuro/Psych Seizures -, Well Controlled,  negative psych ROS   GI/Hepatic Neg liver ROS, GERD  Medicated and Controlled,Anemia, heme positive stool    Endo/Other  diabetes, Well Controlled, Type 2, Insulin DependentHypothyroidism Obesity BMI 35 Last a1c 5.4 Last FS 136 at 12pm  Renal/GU CRFRenal diseaseCKD 4, Cr 2.48  negative genitourinary   Musculoskeletal  (+) Arthritis , Osteoarthritis,    Abdominal (+) + obese,   Peds  Hematology  (+) Blood dyscrasia, anemia , 7.9/27.7   Anesthesia Other Findings    Reproductive/Obstetrics negative OB ROS                           Anesthesia Physical Anesthesia Plan  ASA: III  Anesthesia Plan: MAC   Post-op Pain Management:    Induction:   PONV Risk Score and Plan: 2 and Propofol infusion and TIVA  Airway Management Planned: Natural Airway and Simple Face Mask  Additional Equipment: None  Intra-op Plan:   Post-operative Plan:   Informed Consent: I have reviewed the patients History and Physical, chart, labs and discussed the procedure including the risks, benefits and alternatives for the proposed anesthesia with the patient or authorized representative who has indicated his/her understanding and acceptance.   Patient has DNR.  Discussed DNR with patient and Suspend DNR.     Plan Discussed with: CRNA  Anesthesia Plan Comments: (DNR suspended for 24h perioperatively, including chest compressions (pt consents to chest compressions))       Anesthesia Quick Evaluation

## 2020-04-06 ENCOUNTER — Inpatient Hospital Stay (HOSPITAL_COMMUNITY): Payer: Medicare Other | Admitting: Registered Nurse

## 2020-04-06 ENCOUNTER — Encounter (HOSPITAL_COMMUNITY): Payer: Self-pay | Admitting: Internal Medicine

## 2020-04-06 ENCOUNTER — Encounter (HOSPITAL_COMMUNITY)
Admission: EM | Disposition: A | Discharge status: Home / Self Care | Payer: Self-pay | Source: Home / Self Care | Attending: Internal Medicine

## 2020-04-06 DIAGNOSIS — D128 Benign neoplasm of rectum: Secondary | ICD-10-CM

## 2020-04-06 DIAGNOSIS — D120 Benign neoplasm of cecum: Secondary | ICD-10-CM

## 2020-04-06 DIAGNOSIS — K621 Rectal polyp: Secondary | ICD-10-CM

## 2020-04-06 DIAGNOSIS — K635 Polyp of colon: Secondary | ICD-10-CM

## 2020-04-06 DIAGNOSIS — D649 Anemia, unspecified: Secondary | ICD-10-CM | POA: Diagnosis not present

## 2020-04-06 HISTORY — PX: POLYPECTOMY: SHX5525

## 2020-04-06 HISTORY — PX: COLONOSCOPY WITH PROPOFOL: SHX5780

## 2020-04-06 LAB — BASIC METABOLIC PANEL
Anion gap: 13 (ref 5–15)
BUN: 55 mg/dL — ABNORMAL HIGH (ref 8–23)
CO2: 22 mmol/L (ref 22–32)
Calcium: 10.2 mg/dL (ref 8.9–10.3)
Chloride: 110 mmol/L (ref 98–111)
Creatinine, Ser: 2.25 mg/dL — ABNORMAL HIGH (ref 0.44–1.00)
GFR, Estimated: 21 mL/min — ABNORMAL LOW (ref >60)
Glucose, Bld: 157 mg/dL — ABNORMAL HIGH (ref 70–99)
Potassium: 4 mmol/L (ref 3.5–5.1)
Sodium: 145 mmol/L (ref 135–145)

## 2020-04-06 LAB — CBC
HCT: 38.4 % (ref 36.0–46.0)
Hemoglobin: 10.5 g/dL — ABNORMAL LOW (ref 12.0–15.0)
MCH: 25.2 pg — ABNORMAL LOW (ref 26.0–34.0)
MCHC: 27.3 g/dL — ABNORMAL LOW (ref 30.0–36.0)
MCV: 92.1 fL (ref 80.0–100.0)
Platelets: 364 10*3/uL (ref 150–400)
RBC: 4.17 MIL/uL (ref 3.87–5.11)
RDW: 20.3 % — ABNORMAL HIGH (ref 11.5–15.5)
WBC: 5.6 10*3/uL (ref 4.0–10.5)
nRBC: 0.4 % — ABNORMAL HIGH (ref 0.0–0.2)

## 2020-04-06 LAB — GLUCOSE, CAPILLARY
Glucose-Capillary: 133 mg/dL — ABNORMAL HIGH (ref 70–99)
Glucose-Capillary: 147 mg/dL — ABNORMAL HIGH (ref 70–99)

## 2020-04-06 LAB — SURGICAL PATHOLOGY

## 2020-04-06 SURGERY — COLONOSCOPY WITH PROPOFOL
Anesthesia: Monitor Anesthesia Care

## 2020-04-06 MED ORDER — PANTOPRAZOLE SODIUM 40 MG PO TBEC: 40.0000 mg | DELAYED_RELEASE_TABLET | Freq: Every day | ORAL | Status: DC

## 2020-04-06 MED ORDER — SODIUM CHLORIDE 0.9 % IV SOLN
INTRAVENOUS | Status: DC
  Administered 2020-04-06: 500 mL via INTRAVENOUS

## 2020-04-06 MED ORDER — PROPOFOL 500 MG/50ML IV EMUL
INTRAVENOUS | Status: AC
  Filled 2020-04-06: qty 50

## 2020-04-06 MED ORDER — PROPOFOL 500 MG/50ML IV EMUL
INTRAVENOUS | Status: DC | PRN
  Administered 2020-04-06: 140 ug/kg/min via INTRAVENOUS

## 2020-04-06 MED ORDER — ENSURE MAX PROTEIN PO LIQD: 11.0000 [oz_av] | Freq: Two times a day (BID) | ORAL | 0 refills | Status: AC

## 2020-04-06 MED ORDER — EPHEDRINE SULFATE-NACL 50-0.9 MG/10ML-% IV SOSY
PREFILLED_SYRINGE | INTRAVENOUS | Status: DC | PRN
  Administered 2020-04-06: 10 mg via INTRAVENOUS

## 2020-04-06 MED ORDER — HYDROCORTISONE (PERIANAL) 2.5 % EX CREA: TOPICAL_CREAM | Freq: Two times a day (BID) | CUTANEOUS | 0 refills | Status: DC

## 2020-04-06 MED ORDER — PROPOFOL 10 MG/ML IV BOLUS
INTRAVENOUS | Status: AC
  Filled 2020-04-06: qty 20

## 2020-04-06 MED ORDER — PROPOFOL 10 MG/ML IV BOLUS
INTRAVENOUS | Status: DC | PRN
  Administered 2020-04-06: 20 mg via INTRAVENOUS

## 2020-04-06 SURGICAL SUPPLY — 22 items

## 2020-04-06 NOTE — Discharge Summary (Signed)
Discharge Summary  [name] [name]:[id number] DOB: October 16, 1932  PCP: Unk Pinto, MD  Admit date: 04/03/2020 Discharge date: 04/06/2020  Time spent: 35 minutes.  Recommendations for Outpatient Follow-up:  1. Follow-up with hematology in 1 to 2 weeks. 2. Follow-up with your PCP in 1 to 2 weeks. 3. Follow-up with GI as needed for possible video capsule endoscopy if anemia worsens. 4. Repeat CBC in 1 week at your hematologist clinic. 5. Take your medications as prescribed. 6. Continue seizure precautions. 7. Continue fall precautions.  Discharge Diagnoses:  Active Hospital Problems   Diagnosis Date Noted  . Symptomatic anemia 11/02/2019  . Benign neoplasm of cecum   . Benign neoplasm of rectum   . Blood in stool   . Melena   . Acute blood loss anemia   . GI bleed 04/04/2020  . Type 2 diabetes mellitus with stage 4 chronic kidney disease, with long-term current use of insulin ([place]) 12/08/2017  . Type II diabetes mellitus with neurological manifestations ([place]) 09/15/2013  . Hyperlipidemia associated with type 2 diabetes mellitus ([place]) 06/27/2013  . Hypothyroidism 06/09/2008  . Gastroesophageal reflux disease 06/09/2008  . Essential hypertension 06/09/2008    Resolved Hospital Problems  No resolved problems to display.    Discharge Condition: Stable  Diet recommendation: Resume previous diet.  Vitals:   04/06/20 1400 04/06/20 1410  BP: (!) 132/34 (!) 117/45  Pulse: 77 78  Resp: 18 19  Temp:    SpO2: 98% 97%    History of present illness:  [name] [name] a 87 y.o.femalewith past medical history of chronic normocytic anemia, chronic kidney disease, hypothyroidism, hyperlipidemia, hypertension, type II diabetes mellitus, heart murmur, seizure disorder, GERD, prior adenomatous colon polyps, presented to hospital with complaints of lightheadedness and shortness of breath on exertion. Work-up revealed symptomatic acute on chronic anemia with new positive  stool occult.  Baseline hemoglobin around 11 presenting hemoglobin 6.2.  Hemoglobin improved after 2 unit PRBC transfusions.    Seen by GI, post EGD and colonoscopy.  EGD done on 04/05/2020 showed chronic appearing gastritis without bleeding.  Had a colonoscopy done on 04/06/2020 with no evidence of bleeding.    Hematology will follow up on her hemoglobin outpatient.  If concern for GI bleed, recommend to follow up with GI for possible Video capsule endoscopy outpatient.  04/06/20:  Seen and examined at bedside.  She has no new complaints.  No overt bleeding.  Vital signs and labs reviewed and are stable.  Hemoglobin stable at 10.5K.  Okay to discharge from GI standpoint.   Hospital Course:  Principal Problem:   Symptomatic anemia Active Problems:   Hypothyroidism   Gastroesophageal reflux disease   Essential hypertension   Hyperlipidemia associated with type 2 diabetes mellitus ([facility])   Type II diabetes mellitus with neurological manifestations ([place])   Type 2 diabetes mellitus with stage 4 chronic kidney disease, with long-term current use of insulin ([facility])   GI bleed   Blood in stool   Melena   Acute blood loss anemia   Benign neoplasm of cecum   Benign neoplasm of rectum  Symptomatic acute on chronic anemia with new positive FOBT, post 2 unit PRBCs transfusion, post EGD on 04/05/2020, and post colonoscopy on 04/06/20. Presented with lightheadedness, dyspnea on exertion, positive FOBT, and hemoglobin of 6.2K. Baseline hemoglobin 11K EGD showed chronic appearing gastritis without bleeding.  Recommended to consider colonoscopy versus CT abdomen and pelvis to exclude lower GI tract lesions. Post colonoscopy on 04/06/20:  - Two 2  to 3 mm polyps in the cecum, removed with a cold snare. Resected and retrieved. - One 6 mm polyp in the rectum, removed with a cold snare. Resected and retrieved. - Diverticulosis in the sigmoid colon and in the descending colon. - External and internal  hemorrhoids. Appreciate GI [place]'s assistance. CBC Latest Ref Rng & Units 04/04/2020 04/03/2020 02/02/2020  WBC 4.0 - 10.5 K/uL 5.2 5.2 6.2  Hemoglobin 12.0 - 15.0 g/dL 8.2(L) 6.2(LL) 10.9(L)  Hematocrit 36.0 - 46.0 % 28.6(L) 24.0(L) 35.2  Platelets 150 - 400 K/uL 292 365 332    Gastritis without bleeding, seen on EGD Continue p.o. PPI daily as recommended by GI Can restart home daily ASA 81 mg per GI Dr. Hilarie Fredrickson  Type 2 diabetes mellitus with transient hyperglycemia. Patient is on NPH at home.  Hemoglobin A1c 5.4 on 04/04/2020 Avoid hypoglycemia.   Resume home regimen.  AKI on CKD stage IV. Baseline creatinine appears to be 2.0 with GFR of 23.   Creatinine is downtrending, 2.25 from 2.48.  Continue to avoid nephrotoxic agents Follow-up with your PCP in 1 to 2 weeks.      GERD.Continue PPI.  Essential hypertension BP is at goal. Continue home atenolol.   History of seizure.  Stable Continue home Keppra.   Continue seizure precautions.  Restless leg syndrome. Stable Continue ropinirole.  Hypothyroidism.  Continue Synthroid. Follow-up with your PCP in 1 to 2 weeks.   Consultants:  GI   Anti-infectives:   None   Code Status: DNR.   Procedures:  EGD on 04/05/2020 by Dr. Hilarie Fredrickson  Colonoscopy on 04/06/2020 by Dr. Salley Slaughter 2 units of packed RBC  Discharge Exam: BP (!) 117/45   Pulse 78   Temp 97.8 F (36.6 C) (Oral)   Resp 19   Ht 5\' 2"  (1.575 m)   Wt 83.9 kg   SpO2 97%   BMI 33.84 kg/m  . General: 87 y.o. year-old female well developed well nourished in no acute distress.  Alert and oriented x3. . Cardiovascular: Regular rate and rhythm with no rubs or gallops.  No thyromegaly or JVD noted.   Marland Kitchen Respiratory: Clear to auscultation with no wheezes or rales. Good inspiratory effort. . Abdomen: Soft nontender nondistended with normal bowel sounds x4 quadrants. . Musculoskeletal: No lower extremity edema. 2/4  pulses in all 4 extremities. Marland Kitchen Psychiatry: Mood is appropriate for condition and setting  Discharge Instructions You were cared for by a hospitalist during your hospital stay. If you have any questions about your discharge medications or the care you received while you were in the hospital after you are discharged, you can call the unit and asked to speak with the hospitalist on call if the hospitalist that took care of you is not available. Once you are discharged, your primary care physician will handle any further medical issues. Please note that NO REFILLS for any discharge medications will be authorized once you are discharged, as it is imperative that you return to your primary care physician (or establish a relationship with a primary care physician if you do not have one) for your aftercare needs so that they can reassess your need for medications and monitor your lab values.   Allergies as of 04/06/2020      Reactions   Ppd [tuberculin Purified Protein Derivative] Other (See Comments)   indurated      Medication List    TAKE these medications   acetaminophen 500 MG tablet Commonly known as: TYLENOL Take 1,000  mg by mouth every 6 (six) hours as needed for moderate pain.   allopurinol 100 MG tablet Commonly known as: ZYLOPRIM Take 1 tablet daily to prevent gout. What changed:   how much to take  how to take this  when to take this  reasons to take this  additional instructions   aspirin 81 MG tablet Take 81 mg by mouth daily.   atenolol 50 MG tablet Commonly known as: TENORMIN Take     1 tablet     Daily       for BP What changed:   how much to take  how to take this  when to take this  additional instructions   cholecalciferol 25 MCG (1000 UNIT) tablet Commonly known as: VITAMIN D3 Take 1,000 Units by mouth daily.   Ensure Max Protein Liqd Take 330 mLs (11 oz total) by mouth 2 (two) times daily for 7 days.   fish oil-omega-3 fatty acids 1000 MG  capsule Take 1,000 mg by mouth at bedtime.   furosemide 40 MG tablet Commonly known as: LASIX TAKE 1 TABLET BY MOUTH DAILY FOR FLUID RETENTION/ANKLE SWELLING What changed:   how much to take  how to take this  when to take this  additional instructions   HumuLIN 70/30 (70-30) 100 UNIT/ML injection Generic drug: insulin NPH-regular Human Inject 20 Units into the skin 2 (two) times daily with a meal.   hydrocortisone 2.5 % rectal cream Commonly known as: ANUSOL-HC Place rectally 2 (two) times daily.   lansoprazole 30 MG capsule Commonly known as: PREVACID Take    1 capsule    Daily       to Prevent Heartburn & Indigestion What changed:   how much to take  how to take this  when to take this  additional instructions   levETIRAcetam 500 MG tablet Commonly known as: KEPPRA Take      1 tablet       3 x /day       with Meals to Prevent Seizures What changed:   how much to take  how to take this  when to take this  additional instructions   levothyroxine 100 MCG tablet Commonly known as: SYNTHROID TAKE 1 TABLET BY MOUTH DAILY ON AN EMPTY STOMACH WITH ONLY WATER FOR 30 MINUTES. NO ANTACIDS, CALCIUM OR MAGNESIUM FOR 4 HOURS. AVOID BIOTIN What changed:   how much to take  how to take this  when to take this  additional instructions   loratadine 10 MG tablet Commonly known as: CLARITIN Take 10 mg by mouth daily.   meclizine 25 MG tablet Commonly known as: ANTIVERT Take     1 tablet     3 x /day     Only if needed for Dizziness or Vertigo What changed:   how much to take  how to take this  when to take this  reasons to take this   onetouch ultrasoft lancets Insulin dependent check sugars 2-3 x a day- 3 month supply   OneTouch Verio test strip Generic drug: glucose blood Check blood sugar 3 times daily-[id number]   rOPINIRole 1 MG tablet Commonly known as: Requip Take       1/2 to 1 tablet        3 x /day         as needed for Restless Legs &  Cramps What changed:   how much to take  how to take this  when to take this  additional  instructions   vitamin B-12 100 MCG tablet Commonly known as: CYANOCOBALAMIN Take 100 mcg by mouth daily.   vitamin C 500 MG tablet Commonly known as: ASCORBIC ACID Take 1,000 mg by mouth 3 (three) times daily.      Allergies  Allergen Reactions  . Ppd [Tuberculin Purified Protein Derivative] Other (See Comments)    indurated    Follow-up Information    Go to  Page DEPT.   Specialty: Emergency Medicine Why: If symptoms worsen Contact information: [place] [phone] [place]       Unk Pinto, MD. Call in 1 day(s).   Specialty: Internal Medicine Why: Please call for a post hospital follow-up appointment. Contact information: [address] [place] [address] [phone]        Jerene Bears, MD. Call in 1 day(s).   Specialty: Gastroenterology Why: Please call for a post hospital follow-up appointment. Contact information: [address] [place] [address] [phone]        Brunetta Genera, MD. Call in 1 day(s).   Specialties: Hematology, Oncology Why: Please call for a post hospital appointment. Contact information: [place] [address] [phone]                The results of significant diagnostics from this hospitalization (including imaging, microbiology, ancillary and laboratory) are listed below for reference.    Significant Diagnostic Studies: DG Chest Portable 1 View  Result Date: 04/03/2020 CLINICAL DATA:  Shortness of breath EXAM: PORTABLE CHEST 1 VIEW COMPARISON:  11/16/2017 FINDINGS: Probable tiny pleural effusions. Borderline cardiomegaly with vascular congestion and probable mild pulmonary edema. Aortic atherosclerosis. No pneumothorax. IMPRESSION: Borderline cardiomegaly with vascular  congestion and mild pulmonary edema. Probable small pleural effusions. Electronically Signed   By: Donavan Foil M.D.   On: 04/03/2020 16:24    Microbiology: Recent Results (from the past 240 hour(s))  Resp Panel by RT-PCR (Flu A&B, Covid) Nasopharyngeal Swab     Status: None   Collection Time: 04/03/20  4:33 PM   Specimen: Nasopharyngeal Swab; Nasopharyngeal(NP) swabs in vial transport medium  Result Value Ref Range Status   SARS Coronavirus 2 by RT PCR NEGATIVE NEGATIVE Final    Comment: (NOTE) SARS-CoV-2 target nucleic acids are NOT DETECTED.  The SARS-CoV-2 RNA is generally detectable in upper respiratory specimens during the acute phase of infection. The lowest concentration of SARS-CoV-2 viral copies this assay can detect is 138 copies/mL. A negative result does not preclude SARS-Cov-2 infection and should not be used as the sole basis for treatment or other patient management decisions. A negative result may occur with  improper specimen collection/handling, submission of specimen other than nasopharyngeal swab, presence of viral mutation(s) within the areas targeted by this assay, and inadequate number of viral copies(<138 copies/mL). A negative result must be combined with clinical observations, patient history, and epidemiological information. The expected result is Negative.  Fact Sheet for Patients:  EntrepreneurPulse.com.au  Fact Sheet for Healthcare Providers:  IncredibleEmployment.be  This test is no t yet approved or cleared by the [place] FDA and  has been authorized for detection and/or diagnosis of SARS-CoV-2 by FDA under an Emergency Use Authorization (EUA). This EUA will remain  in effect (meaning this test can be used) for the duration of the COVID-19 declaration under Section 564(b)(1) of the Act, 21 U.S.C.section 360bbb-3(b)(1), unless the authorization is terminated  or revoked sooner.       Influenza  A by PCR  NEGATIVE NEGATIVE Final   Influenza B by PCR NEGATIVE NEGATIVE Final    Comment: (NOTE) The Xpert Xpress SARS-CoV-2/FLU/RSV plus assay is intended as an aid in the diagnosis of influenza from Nasopharyngeal swab specimens and should not be used as a sole basis for treatment. Nasal washings and aspirates are unacceptable for Xpert Xpress SARS-CoV-2/FLU/RSV testing.  Fact Sheet for Patients: EntrepreneurPulse.com.au  Fact Sheet for Healthcare Providers: IncredibleEmployment.be  This test is not yet approved or cleared by the [place] FDA and has been authorized for detection and/or diagnosis of SARS-CoV-2 by FDA under an Emergency Use Authorization (EUA). This EUA will remain in effect (meaning this test can be used) for the duration of the COVID-19 declaration under Section 564(b)(1) of the Act, 21 U.S.C. section 360bbb-3(b)(1), unless the authorization is terminated or revoked.  Performed at [facility], [place] [address], [place], [place] [address]      Labs: Basic Metabolic Panel: Recent Labs  Lab 04/03/20 1533 04/04/20 0548 04/05/20 0551 04/06/20 0821  NA 141 143 143 145  K 3.8 3.7 3.8 4.0  CL 110 113* 113* 110  CO2 18* 18* 18* 22  GLUCOSE 164* 141* 159* 157*  BUN 79* 66* 60* 55*  CREATININE 2.49* 2.27* 2.48* 2.25*  CALCIUM 9.2 9.1 9.2 10.2  MG  --   --  2.2  --   PHOS  --   --  4.4  --    Liver Function Tests: Recent Labs  Lab 04/03/20 1533 04/04/20 0548  AST 28 14*  ALT 15 14  ALKPHOS 42 41  BILITOT 0.9 1.2  PROT 6.8 5.7*  ALBUMIN 4.1 3.6   No results for input(s): LIPASE, AMYLASE in the last 168 hours. No results for input(s): AMMONIA in the last 168 hours. CBC: Recent Labs  Lab 04/03/20 1533 04/04/20 0548 04/05/20 0551 04/06/20 0821  WBC 5.2 5.2 4.4 5.6  NEUTROABS 4.0  --   --   --   HGB 6.2* 8.2* 7.9* 10.5*  HCT 24.0* 28.6* 27.7* 38.4  MCV 93.4 89.7 91.1 92.1  PLT 365 292 282 364    Cardiac Enzymes: No results for input(s): CKTOTAL, CKMB, CKMBINDEX, TROPONINI in the last 168 hours. BNP: BNP (last 3 results) Recent Labs    04/03/20 1533  BNP 500.8*    ProBNP (last 3 results) No results for input(s): PROBNP in the last 8760 hours.  CBG: Recent Labs  Lab 04/05/20 1159 04/05/20 1701 04/05/20 2130 04/06/20 0749 04/06/20 1230  GLUCAP 136* 155* 143* 133* 147*       Signed:  Kayleen Memos, MD [facility] 04/06/2020, 3:00 PM

## 2020-04-06 NOTE — Progress Notes (Signed)
PT Cancellation Note  Patient Details Name: [name] [name] MRN: [id number] DOB: 29-Jun-1932   Cancelled Treatment:    Reason Eval/Treat Not Completed: PT screened, no needs identified, will sign off. Pt standing at sink in restroom upon arrival, walking back to bed with hands reaching out for wall or furniture to steady. Pt reports living at [facility], well equipped with DME, denies recent falls. Pt ambulates to transport chair in hallway to go for colonoscopy without LOB or difficulty. No acute PT needs identified. Please re-consult if needs arise.    Talbot Grumbling PT, DPT 04/06/20, 12:24 PM

## 2020-04-06 NOTE — Anesthesia Postprocedure Evaluation (Signed)
Anesthesia Post Note  Patient: [name] [name]  Procedure(s) Performed: COLONOSCOPY WITH PROPOFOL (N/A ) POLYPECTOMY     Patient location during evaluation: Endoscopy Anesthesia Type: MAC Level of consciousness: awake and alert Pain management: pain level controlled Vital Signs Assessment: post-procedure vital signs reviewed and stable Respiratory status: spontaneous breathing, nonlabored ventilation and respiratory function stable Cardiovascular status: stable and blood pressure returned to baseline Postop Assessment: no apparent nausea or vomiting Anesthetic complications: no   No complications documented.  Last Vitals:  Vitals:   04/06/20 1400 04/06/20 1410  BP: (!) 132/34 (!) 117/45  Pulse: 77 78  Resp: 18 19  Temp:    SpO2: 98% 97%    Last Pain:  Vitals:   04/06/20 1410  TempSrc:   PainSc: 0-No pain                 Travonna Swindle,W. EDMOND

## 2020-04-06 NOTE — Discharge Instructions (Signed)

## 2020-04-06 NOTE — Anesthesia Preprocedure Evaluation (Addendum)
Anesthesia Evaluation  Patient identified by MRN, date of birth, ID band Patient awake    Reviewed: Allergy & Precautions, H&P , NPO status , Patient's Chart, lab work & pertinent test results, reviewed documented beta blocker date and time   Airway Mallampati: III  TM Distance: >3 FB Neck ROM: Full    Dental no notable dental hx. (+) Teeth Intact, Dental Advisory Given   Pulmonary sleep apnea , former smoker,    Pulmonary exam normal breath sounds clear to auscultation       Cardiovascular hypertension, Pt. on medications and Pt. on home beta blockers  Rhythm:Regular Rate:Normal     Neuro/Psych Seizures -, Well Controlled,  negative psych ROS   GI/Hepatic Neg liver ROS, GERD  Medicated,  Endo/Other  diabetes, Insulin DependentHypothyroidism Morbid obesity  Renal/GU Renal InsufficiencyRenal disease  negative genitourinary   Musculoskeletal  (+) Arthritis , Osteoarthritis,    Abdominal   Peds  Hematology  (+) Blood dyscrasia, anemia ,   Anesthesia Other Findings   Reproductive/Obstetrics negative OB ROS                            Anesthesia Physical Anesthesia Plan  ASA: III  Anesthesia Plan: MAC   Post-op Pain Management:    Induction: Intravenous  PONV Risk Score and Plan: 2 and Propofol infusion and Treatment may vary due to age or medical condition  Airway Management Planned: Simple Face Mask  Additional Equipment:   Intra-op Plan:   Post-operative Plan:   Informed Consent: I have reviewed the patients History and Physical, chart, labs and discussed the procedure including the risks, benefits and alternatives for the proposed anesthesia with the patient or authorized representative who has indicated his/her understanding and acceptance.   Patient has DNR.  Discussed DNR with patient and Suspend DNR.   Dental advisory given  Plan Discussed with: CRNA  Anesthesia Plan  Comments:        Anesthesia Quick Evaluation

## 2020-04-06 NOTE — Interval H&P Note (Signed)
History and Physical Interval Note: For colonoscopy this afternoon to eval melena and heme + stools with acute anemia.  EGD unrevealing for bleeding source yesterday The nature of the procedure, as well as the risks, benefits, and alternatives were carefully and thoroughly reviewed with the patient. Ample time for discussion and questions allowed. The patient understood, was satisfied, and agreed to proceed.   CBC Latest Ref Rng & Units 04/06/2020 04/05/2020 04/04/2020  WBC 4.0 - 10.5 K/uL 5.6 4.4 5.2  Hemoglobin 12.0 - 15.0 g/dL 10.5(L) 7.9(L) 8.2(L)  Hematocrit 36.0 - 46.0 % 38.4 27.7(L) 28.6(L)  Platelets 150 - 400 K/uL 364 282 292     04/06/2020 1:00 PM  [name]  has presented today for surgery, with the diagnosis of heme + stools, acute anemia.  The various methods of treatment have been discussed with the patient and family. After consideration of risks, benefits and other options for treatment, the patient has consented to  Procedure(s): COLONOSCOPY WITH PROPOFOL (N/A) as a surgical intervention.  The patient's history has been reviewed, patient examined, no change in status, stable for surgery.  I have reviewed the patient's chart and labs.  Questions were answered to the patient's satisfaction.     [name] [name]

## 2020-04-06 NOTE — Transfer of Care (Signed)
Immediate Anesthesia Transfer of Care Note  Patient: [name]  Procedure(s) Performed: COLONOSCOPY WITH PROPOFOL (N/A ) POLYPECTOMY  Patient Location: PACU and Endoscopy Unit  Anesthesia Type:MAC  Level of Consciousness: awake, alert , oriented and patient cooperative  Airway & Oxygen Therapy: Patient Spontanous Breathing and Patient connected to face mask oxygen  Post-op Assessment: Report given to RN, Post -op Vital signs reviewed and stable and Patient moving all extremities  Post vital signs: Reviewed and stable  Last Vitals:  Vitals Value Taken Time  BP 108/50 04/06/20 1348  Temp    Pulse 79 04/06/20 1349  Resp 17 04/06/20 1349  SpO2 100 % 04/06/20 1349  Vitals shown include unvalidated device data.  Last Pain:  Vitals:   04/06/20 1214  TempSrc: Oral  PainSc: 0-No pain         Complications: No complications documented.

## 2020-04-06 NOTE — Op Note (Signed)
[facility] Patient Name: [name] [name] Procedure Date: 04/06/2020 MRN: [id number] Attending MD: Jerene Bears , MD Date of Birth: 03-02-1932 CSN: [id number] Age: 87 Admit Type: Inpatient Procedure:                Colonoscopy Indications:              Heme positive stool, Acute post hemorrhagic anemia Providers:                [name]. Hilarie Fredrickson, MD, Cleda Daub, RN, Cherylynn Ridges, Technician, Courtney Heys [place], CRNA Referring MD:             [facility] Medicines:                Monitored Anesthesia Care Complications:            No immediate complications. Estimated Blood Loss:     Estimated blood loss was minimal. Procedure:                Pre-Anesthesia Assessment:                           - Prior to the procedure, a History and Physical                            was performed, and patient medications and                            allergies were reviewed. The patient's tolerance of                            previous anesthesia was also reviewed. The risks                            and benefits of the procedure and the sedation                            options and risks were discussed with the patient.                            All questions were answered, and informed consent                            was obtained. Prior Anticoagulants: The patient has                            taken no previous anticoagulant or antiplatelet                            agents. ASA Grade Assessment: III - A patient with                            severe systemic disease. After reviewing the risks  and benefits, the patient was deemed in                            satisfactory condition to undergo the procedure.                           After obtaining informed consent, the colonoscope                            was passed under direct vision. Throughout the                            procedure, the patient's  blood pressure, pulse, and                            oxygen saturations were monitored continuously. The                            CF-HQ190L ([id number] Olympus colonoscope was                            introduced through the anus and advanced to the                            cecum, identified by appendiceal orifice and                            ileocecal valve. The colonoscopy was performed                            without difficulty. The patient tolerated the                            procedure well. The quality of the bowel                            preparation was good. The ileocecal valve,                            appendiceal orifice, and rectum were photographed. Scope In: 1:14:31 PM Scope Out: 1:37:25 PM Scope Withdrawal Time: 0 hours 17 minutes 10 seconds  Total Procedure Duration: 0 hours 22 minutes 54 seconds  Findings:      Hemorrhoids were found on perianal exam.      Two sessile polyps were found in the cecum. The polyps were 2 to 3 mm in       size. These polyps were removed with a cold snare. Resection and       retrieval were complete.      A 6 mm polyp was found in the rectum. The polyp was sessile. The polyp       was removed with a cold snare. Resection and retrieval were complete.      A few small-mouthed diverticula were found in the sigmoid colon and       descending colon.      External and internal hemorrhoids were found during  retroflexion. The       hemorrhoids were medium-sized. Impression:               - Two 2 to 3 mm polyps in the cecum, removed with a                            cold snare. Resected and retrieved.                           - One 6 mm polyp in the rectum, removed with a cold                            snare. Resected and retrieved.                           - Diverticulosis in the sigmoid colon and in the                            descending colon.                           - External and internal hemorrhoids. Moderate  Sedation:      N/A Recommendation:           - Patient has a contact number available for                            emergencies. The signs and symptoms of potential                            delayed complications were discussed with the                            patient. Return to normal activities tomorrow.                            Written discharge instructions were provided to the                            patient.                           - Resume previous diet.                           - Continue present medications.                           - Await pathology results.                           - Observe Hgb, if further unexplained decline or                            dark/tarry stools then video capsule endoscopy  should be considered.                           - No repeat colonoscopy due to age. Procedure Code(s):        --- Professional ---                           [phone], Colonoscopy, flexible; with removal of                            tumor(s), polyp(s), or other lesion(s) by snare                            technique Diagnosis Code(s):        --- Professional ---                           K64.8, Other hemorrhoids                           K63.5, Polyp of colon                           K62.1, Rectal polyp                           R19.5, Other fecal abnormalities                           D62, Acute posthemorrhagic anemia                           K57.30, Diverticulosis of large intestine without                            perforation or abscess without bleeding CPT copyright 2019 [employer] Association. All rights reserved. The codes documented in this report are preliminary and upon coder review may  be revised to meet current compliance requirements. Jerene Bears, MD 04/06/2020 2:12:19 PM This report has been signed electronically. Number of Addenda: 0

## 2020-04-07 ENCOUNTER — Encounter: Payer: Self-pay | Admitting: Internal Medicine

## 2020-04-07 ENCOUNTER — Encounter (HOSPITAL_COMMUNITY): Payer: Self-pay | Admitting: Internal Medicine

## 2020-04-07 LAB — SURGICAL PATHOLOGY

## 2020-04-10 ENCOUNTER — Telehealth: Payer: Self-pay | Admitting: *Deleted

## 2020-04-10 NOTE — Telephone Encounter (Signed)
Called patient on 04/10/2020 , 11:54 AM in an attempt to reach the patient for a hospital follow up. Spoke with patient.  Admit date: 04/03/20 Discharge: 04/06/20   She does not have any questions or concerns about medications from the hospital admission. The patient's medications were reviewed over the phone, they were counseled to bring in all current medications to the hospital follow up visit.   I advised the patient to call if any questions or concerns arise about the hospital admission or medications. Patient had no questions regarding medication. The only addition is Ensure.     Home health was not started in the hospital.  All questions were answered and a follow up appointment was made. Appointment on 04/20/2019 with Gilberto Better.  Prior to Admission medications   Medication Sig Start Date End Date Taking? Authorizing Provider  acetaminophen (TYLENOL) 500 MG tablet Take 1,000 mg by mouth every 6 (six) hours as needed for moderate pain.    [provider]  allopurinol (ZYLOPRIM) 100 MG tablet Take 1 tablet daily to prevent gout. Patient taking differently: Take 100 mg by mouth daily as needed (gout). 11/17/19   Unk Pinto, MD  aspirin 81 MG tablet Take 81 mg by mouth daily.    [provider]  atenolol (TENORMIN) 50 MG tablet Take     1 tablet     Daily       for BP Patient taking differently: Take 50 mg by mouth daily. 12/27/19   Unk Pinto, MD  cholecalciferol (VITAMIN D3) 25 MCG (1000 UNIT) tablet Take 1,000 Units by mouth daily.    [provider]  Ensure Max Protein (ENSURE MAX PROTEIN) LIQD Take 330 mLs (11 oz total) by mouth 2 (two) times daily for 7 days. 04/06/20 04/13/20  Kayleen Memos, DO  fish oil-omega-3 fatty acids 1000 MG capsule Take 1,000 mg by mouth at bedtime.    [provider]  furosemide (LASIX) 40 MG tablet TAKE 1 TABLET BY MOUTH DAILY FOR FLUID RETENTION/ANKLE SWELLING Patient taking differently: Take 40 mg by mouth  daily. 12/27/19   [name], NP  glucose blood (ONETOUCH VERIO) test strip Check blood sugar 3 times daily-DX-E11.22 04/05/19   Unk Pinto, MD  hydrocortisone (ANUSOL-HC) 2.5 % rectal cream Place rectally 2 (two) times daily. 04/06/20   Kayleen Memos, DO  insulin NPH-regular Human (HUMULIN 70/30) (70-30) 100 UNIT/ML injection Inject 20 Units into the skin 2 (two) times daily with a meal. 02/02/20   [name], NP  Lancets (ONETOUCH ULTRASOFT) lancets Insulin dependent check sugars 2-3 x a day- 3 month supply 09/15/19   Vladimir Crofts, PA-C  lansoprazole (PREVACID) 30 MG capsule Take    1 capsule    Daily       to Prevent Heartburn & Indigestion Patient taking differently: Take 30 mg by mouth at bedtime. 02/13/20   Unk Pinto, MD  levETIRAcetam (KEPPRA) 500 MG tablet Take      1 tablet       3 x /day       with Meals to Prevent Seizures Patient taking differently: Take 500 mg by mouth 3 (three) times daily. 02/13/20   Unk Pinto, MD  levothyroxine (SYNTHROID) 100 MCG tablet TAKE 1 TABLET BY MOUTH DAILY ON AN EMPTY STOMACH WITH ONLY WATER FOR 30 MINUTES. NO ANTACIDS, CALCIUM OR MAGNESIUM FOR 4 HOURS. AVOID BIOTIN Patient taking differently: Take 100 mcg by mouth daily before breakfast. 01/24/20   Unk Pinto, MD  loratadine (  CLARITIN) 10 MG tablet Take 10 mg by mouth daily.    [provider]  meclizine (ANTIVERT) 25 MG tablet Take     1 tablet     3 x /day     Only if needed for Dizziness or Vertigo Patient taking differently: Take 25 mg by mouth daily as needed for dizziness or nausea. Take     1 tablet     3 x /day     Only if needed for Dizziness or Vertigo 11/23/19   Unk Pinto, MD  rOPINIRole (REQUIP) 1 MG tablet Take       1/2 to 1 tablet        3 x /day         as needed for Restless Legs & Cramps Patient taking differently: Take 1 mg by mouth 3 (three) times daily. 02/13/20   Unk Pinto, MD  vitamin B-12 (CYANOCOBALAMIN) 100 MCG tablet  Take 100 mcg by mouth daily.     [provider]  vitamin C (ASCORBIC ACID) 500 MG tablet Take 1,000 mg by mouth 3 (three) times daily.     [provider]

## 2020-04-19 ENCOUNTER — Encounter: Payer: Self-pay | Admitting: Adult Health Nurse Practitioner

## 2020-04-19 ENCOUNTER — Ambulatory Visit: Payer: Medicare Other | Admitting: Adult Health Nurse Practitioner

## 2020-04-19 ENCOUNTER — Other Ambulatory Visit: Payer: Self-pay

## 2020-04-19 VITALS — BP 124/76 | HR 63 | Temp 97.5°F | Wt 190.0 lb

## 2020-04-19 DIAGNOSIS — Z794 Long term (current) use of insulin: Secondary | ICD-10-CM

## 2020-04-19 DIAGNOSIS — G40909 Epilepsy, unspecified, not intractable, without status epilepticus: Secondary | ICD-10-CM | POA: Diagnosis not present

## 2020-04-19 DIAGNOSIS — E1122 Type 2 diabetes mellitus with diabetic chronic kidney disease: Secondary | ICD-10-CM

## 2020-04-19 DIAGNOSIS — N184 Chronic kidney disease, stage 4 (severe): Secondary | ICD-10-CM

## 2020-04-19 DIAGNOSIS — D62 Acute posthemorrhagic anemia: Secondary | ICD-10-CM | POA: Diagnosis not present

## 2020-04-19 DIAGNOSIS — I10 Essential (primary) hypertension: Secondary | ICD-10-CM | POA: Diagnosis not present

## 2020-04-19 NOTE — Progress Notes (Addendum)
Hospital follow up   Assessment and Plan: Hospital visit follow up for anemia:   All medications were reviewed with patient and family and fully reconciled. All questions answered fully, and patient and family members were encouraged to call the office with any further questions or concerns. Discussed goal to avoid readmission related to this diagnosis.  There are no discontinued medications.  [name] [name] was seen today for hospitalization follow-up.  Diagnoses and all orders for this visit:  Acute blood loss anemia -     CBC with Differential/Platelet -     COMPLETE METABOLIC PANEL WITH GFR Has follow up with Dr Francesca Oman Dr Hilarie Fredrickson office for appointment  CKD stage 4 due to type 2 diabetes mellitus ([place]) Increase fluids  Avoid NSAIDS Blood pressure control Monitor sugars  Will continue to monitor  Type 2 diabetes mellitus with stage 4 chronic kidney disease, with long-term current use of insulin (HCC) Continue medications: Discussed general issues about diabetes pathophysiology and management. Education: Reviewed 'ABCs' of diabetes management (respective goals in parentheses):  A1C (<7), blood pressure (<130/80), and cholesterol (LDL <70) Dietary recommendations Encouraged aerobic exercise.  Discussed foot care, check daily Yearly retinal exam Dental exam every 6 months Monitor blood glucose, discussed goal for patient   Essential hypertension Continue current medications: Monitor blood pressure at home; call if consistently over 130/80 Continue DASH diet.   Reminder to go to the ER if any CP, SOB, nausea, dizziness, severe HA, changes vision/speech, left arm numbness and tingling and jaw pain.  Seizure disorder ([place]) Doing well Precautions discussed Continue medication     CAN NOT DO FOR BCBS REGULAR OR MEDICARE Over 40 minutes of exam, counseling, chart review, and complex, high/moderate level critical decision making was performed this visit.   Future  Appointments  Date Time Provider [place]  04/24/2020 11:30 AM [facility] None  04/24/2020 12:00 PM Brunetta Genera, MD [facility] None  05/05/2020 10:30 AM Unk Pinto, MD [facility] None  11/14/2020 10:00 AM Unk Pinto, MD [facility] None  02/01/2021 10:00 AM [name], NP [facility] None     HPI 87 y.o.female presents for follow up for transition from recent hospitalization or SNIF stay. Admit date to the hospital was 04/03/20, patient was discharged from the hospital on 04/06/20 and our clinical staff contacted the office the day after discharge to set up a follow up appointment. The discharge summary, medications, and diagnostic test results were reviewed before meeting with the patient. The patient was admitted for: acute blood loss anemia.   Colonoscopy Impression:      - Two 2 to 3 mm polyps in the cecum, removed with a                            cold snare. Resected and retrieved.                           - One 6 mm polyp in the rectum, removed with a cold                            snare. Resected and retrieved.                           - Diverticulosis in the sigmoid colon and in the  descending colon.                           - External and internal hemorrhoids.  EGD Impression:     - Normal esophagus.                           - Chronic-appearing gastritis without bleeding.                            Biopsied.                           - Otherwise normal stomach. Biopsies were taken                            with a cold forceps for histology and Helicobacter                            pylori testing.                           - Normal examined duodenum.                           - No source found for recent GI bleeding and acute                            anemia.  She reports she feels like she has more strength.  Similar to symptomatic anemia and hospitalization 11/02/19.  She is following with Dr  Irene Limbo for Iron deficiency anemia and receiving transfusions last 01/21/20.  She also follows with Dr Royce Macadamia for CKD 4, next OV two weeks.    Needs to call to schedule, patient aware. [name], [name], MD. Call in 1 day(s).   Specialty: Gastroenterology Why: Please call for a post hospital follow-up appointment. Contact information: [address] [address] [phone]   Brunetta Genera, MD. Call in 1 day(s).   Specialties: Hematology, Oncology Why: Please call for a post hospital appointment. Contact information: [place] [address] [phone] Has appointment schedule next Monday.  In two weeks follow up with Dr Royce Macadamia.       Home health is not involved.   Images while in the hospital: DG Chest Portable 1 View  Result Date: 04/03/2020 CLINICAL DATA:  Shortness of breath EXAM: PORTABLE CHEST 1 VIEW COMPARISON:  11/16/2017 FINDINGS: Probable tiny pleural effusions. Borderline cardiomegaly with vascular congestion and probable mild pulmonary edema. Aortic atherosclerosis. No pneumothorax. IMPRESSION: Borderline cardiomegaly with vascular congestion and mild pulmonary edema. Probable small pleural effusions. Electronically Signed   By: Donavan Foil M.D.   On: 04/03/2020 16:24     Current Outpatient Medications (Endocrine & Metabolic):  .  insulin NPH-regular Human (HUMULIN 70/30) (70-30) 100 UNIT/ML injection, Inject 20 Units into the skin 2 (two) times daily with a meal. .  levothyroxine (SYNTHROID) 100 MCG tablet, TAKE 1 TABLET BY MOUTH DAILY ON AN EMPTY STOMACH WITH ONLY WATER FOR 30 MINUTES. NO ANTACIDS, CALCIUM OR MAGNESIUM FOR 4 HOURS. AVOID BIOTIN (Patient taking differently: Take 100 mcg by mouth daily before breakfast.)  Current Outpatient  Medications (Cardiovascular):  .  atenolol (TENORMIN) 50 MG tablet, Take     1 tablet     Daily       for BP (Patient taking differently: Take 50 mg by mouth daily.) .  furosemide  (LASIX) 40 MG tablet, TAKE 1 TABLET BY MOUTH DAILY FOR FLUID RETENTION/ANKLE SWELLING (Patient taking differently: Take 40 mg by mouth daily.)  Current Outpatient Medications (Respiratory):  .  loratadine (CLARITIN) 10 MG tablet, Take 10 mg by mouth daily.  Current Outpatient Medications (Analgesics):  .  acetaminophen (TYLENOL) 500 MG tablet, Take 1,000 mg by mouth every 6 (six) hours as needed for moderate pain. Marland Kitchen  allopurinol (ZYLOPRIM) 100 MG tablet, Take 1 tablet daily to prevent gout. (Patient taking differently: Take 100 mg by mouth daily as needed (gout).) .  aspirin 81 MG tablet, Take 81 mg by mouth daily.  Current Outpatient Medications (Hematological):  .  vitamin B-12 (CYANOCOBALAMIN) 100 MCG tablet, Take 100 mcg by mouth daily.   Current Outpatient Medications (Other):  .  cholecalciferol (VITAMIN D3) 25 MCG (1000 UNIT) tablet, Take 1,000 Units by mouth daily. .  fish oil-omega-3 fatty acids 1000 MG capsule, Take 1,000 mg by mouth at bedtime. Marland Kitchen  glucose blood (ONETOUCH VERIO) test strip, Check blood sugar 3 times daily-DX-E11.22 .  hydrocortisone (ANUSOL-HC) 2.5 % rectal cream, Place rectally 2 (two) times daily. .  Lancets (ONETOUCH ULTRASOFT) lancets, Insulin dependent check sugars 2-3 x a day- 3 month supply .  lansoprazole (PREVACID) 30 MG capsule, Take    1 capsule    Daily       to Prevent Heartburn & Indigestion (Patient taking differently: Take 30 mg by mouth at bedtime.) .  levETIRAcetam (KEPPRA) 500 MG tablet, Take      1 tablet       3 x /day       with Meals to Prevent Seizures (Patient taking differently: Take 500 mg by mouth 3 (three) times daily.) .  meclizine (ANTIVERT) 25 MG tablet, Take     1 tablet     3 x /day     Only if needed for Dizziness or Vertigo (Patient taking differently: Take 25 mg by mouth daily as needed for dizziness or nausea. Take     1 tablet     3 x /day     Only if needed for Dizziness or Vertigo) .  rOPINIRole (REQUIP) 1 MG tablet, Take        1/2 to 1 tablet        3 x /day         as needed for Restless Legs & Cramps (Patient taking differently: Take 1 mg by mouth 3 (three) times daily.) .  vitamin C (ASCORBIC ACID) 500 MG tablet, Take 1,000 mg by mouth 3 (three) times daily.   Past Medical History:  Diagnosis Date  . Anemia, unspecified   . Arthritis    "knees; right shoulder" (09/15/2013)  . Basal cell carcinoma    "right upper outer lip"  . DM neuropathy, type II diabetes mellitus ([place])   . Dyspnea 2021   with low iron  . GERD (gastroesophageal reflux disease)   . Gout   . High cholesterol   . Hypertension   . Hypothyroidism   . Meniere's disease   . Obesity (BMI 30-39.9)   . Other forms of epilepsy and recurrent seizures without mention of intractable epilepsy 11/04/2012   Non convulsive paroxysmal spells, responding to [facility], patient not  driving.   . Pneumonia ~ 1943  . Skin cancer    "forehead; right hand"  . Stage 4 chronic kidney disease ([place])   . UTI (urinary tract infection)   . Vitamin D deficiency      Allergies  Allergen Reactions  . Ppd [Tuberculin Purified Protein Derivative] Other (See Comments)    indurated    ROS: all negative except above.   Physical Exam: Filed Weights   04/19/20 1117  Weight: 190 lb (86.2 kg)   BP 124/76   Pulse 63   Temp (!) 97.5 F (36.4 C)   Wt 190 lb (86.2 kg)   SpO2 93%   BMI 34.75 kg/m  General Appearance: Well nourished, in no apparent distress. Eyes: PERRLA, EOMs, conjunctiva no swelling or erythema Sinuses: No Frontal/maxillary tenderness ENT/Mouth: Ext aud canals clear, TMs without erythema, bulging. No erythema, swelling, or exudate on post pharynx.  Tonsils not swollen or erythematous. Hearing normal.  Neck: Supple, thyroid normal.  Respiratory: Respiratory effort normal, BS equal bilaterally without rales, rhonchi, wheezing or stridor.  Cardio: RRR with no RGs. Murmur systolic, loud.   Brisk peripheral pulses without edema.  Abdomen: Soft, + BS.   Non tender, no guarding, rebound, hernias, masses. Lymphatics: Non tender without lymphadenopathy.  Musculoskeletal: Full ROM, 5/5 strength, normal gait.  Skin: Warm, dry without rashes, lesions, ecchymosis.  Neuro: Cranial nerves intact. Normal muscle tone, no cerebellar symptoms. Sensation intact.  Psych: Awake and oriented X 3, normal affect, Insight and Judgment appropriate.     [name], NP 11:57 AM [facility]

## 2020-04-20 LAB — COMPLETE METABOLIC PANEL WITH GFR
AG Ratio: 2 (calc) (ref 1.0–2.5)
ALT: 11 U/L (ref 6–29)
AST: 11 U/L (ref 10–35)
Albumin: 4.1 g/dL (ref 3.6–5.1)
Alkaline phosphatase (APISO): 58 U/L (ref 37–153)
BUN/Creatinine Ratio: 30 (calc) — ABNORMAL HIGH (ref 6–22)
BUN: 55 mg/dL — ABNORMAL HIGH (ref 7–25)
CO2: 23 mmol/L (ref 20–32)
Calcium: 9.6 mg/dL (ref 8.6–10.4)
Chloride: 108 mmol/L (ref 98–110)
Creat: 1.84 mg/dL — ABNORMAL HIGH (ref 0.60–0.88)
GFR, Est African American: 28 mL/min/{1.73_m2} — ABNORMAL LOW (ref >=60)
GFR, Est Non African American: 25 mL/min/{1.73_m2} — ABNORMAL LOW (ref >=60)
Globulin: 2.1 g/dL (calc) (ref 1.9–3.7)
Glucose, Bld: 218 mg/dL — ABNORMAL HIGH (ref 65–99)
Potassium: 4.7 mmol/L (ref 3.5–5.3)
Sodium: 140 mmol/L (ref 135–146)
Total Bilirubin: 0.4 mg/dL (ref 0.2–1.2)
Total Protein: 6.2 g/dL (ref 6.1–8.1)

## 2020-04-20 LAB — CBC WITH DIFFERENTIAL/PLATELET
Absolute Monocytes: 469 cells/uL (ref 200–950)
Basophils Absolute: 49 cells/uL (ref 0–200)
Basophils Relative: 0.7 %
Eosinophils Absolute: 693 cells/uL — ABNORMAL HIGH (ref 15–500)
Eosinophils Relative: 9.9 %
HCT: 33.9 % — ABNORMAL LOW (ref 35.0–45.0)
Hemoglobin: 9.8 g/dL — ABNORMAL LOW (ref 11.7–15.5)
Lymphs Abs: 658 cells/uL — ABNORMAL LOW (ref 850–3900)
MCH: 24.6 pg — ABNORMAL LOW (ref 27.0–33.0)
MCHC: 28.9 g/dL — ABNORMAL LOW (ref 32.0–36.0)
MCV: 85.2 fL (ref 80.0–100.0)
MPV: 10.2 fL (ref 7.5–12.5)
Monocytes Relative: 6.7 %
Neutro Abs: 5131 cells/uL (ref 1500–7800)
Neutrophils Relative %: 73.3 %
Platelets: 420 10*3/uL — ABNORMAL HIGH (ref 140–400)
RBC: 3.98 10*6/uL (ref 3.80–5.10)
RDW: 18.1 % — ABNORMAL HIGH (ref 11.0–15.0)
Total Lymphocyte: 9.4 %
WBC: 7 10*3/uL (ref 3.8–10.8)
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
## 2020-04-24 ENCOUNTER — Inpatient Hospital Stay: Payer: Medicare Other | Attending: Hematology

## 2020-04-24 ENCOUNTER — Telehealth: Payer: Self-pay | Admitting: Hematology

## 2020-04-24 ENCOUNTER — Inpatient Hospital Stay: Payer: Medicare Other | Admitting: Hematology

## 2020-04-24 ENCOUNTER — Other Ambulatory Visit: Payer: Self-pay

## 2020-04-24 VITALS — BP 149/50 | HR 79 | Temp 97.8°F | Resp 17 | Ht 62.0 in | Wt 187.3 lb

## 2020-04-24 DIAGNOSIS — N184 Chronic kidney disease, stage 4 (severe): Secondary | ICD-10-CM | POA: Diagnosis not present

## 2020-04-24 DIAGNOSIS — D509 Iron deficiency anemia, unspecified: Secondary | ICD-10-CM

## 2020-04-24 DIAGNOSIS — G40909 Epilepsy, unspecified, not intractable, without status epilepticus: Secondary | ICD-10-CM | POA: Diagnosis not present

## 2020-04-24 DIAGNOSIS — I10 Essential (primary) hypertension: Secondary | ICD-10-CM | POA: Diagnosis not present

## 2020-04-24 DIAGNOSIS — Z7982 Long term (current) use of aspirin: Secondary | ICD-10-CM | POA: Diagnosis not present

## 2020-04-24 DIAGNOSIS — Z794 Long term (current) use of insulin: Secondary | ICD-10-CM | POA: Diagnosis not present

## 2020-04-24 DIAGNOSIS — D631 Anemia in chronic kidney disease: Secondary | ICD-10-CM | POA: Diagnosis not present

## 2020-04-24 DIAGNOSIS — Z79899 Other long term (current) drug therapy: Secondary | ICD-10-CM | POA: Insufficient documentation

## 2020-04-24 DIAGNOSIS — Z87891 Personal history of nicotine dependence: Secondary | ICD-10-CM | POA: Insufficient documentation

## 2020-04-24 DIAGNOSIS — D471 Chronic myeloproliferative disease: Secondary | ICD-10-CM

## 2020-04-24 DIAGNOSIS — D649 Anemia, unspecified: Secondary | ICD-10-CM | POA: Diagnosis not present

## 2020-04-24 DIAGNOSIS — E039 Hypothyroidism, unspecified: Secondary | ICD-10-CM | POA: Insufficient documentation

## 2020-04-24 DIAGNOSIS — E119 Type 2 diabetes mellitus without complications: Secondary | ICD-10-CM | POA: Diagnosis not present

## 2020-04-24 DIAGNOSIS — D75839 Thrombocytosis, unspecified: Secondary | ICD-10-CM | POA: Diagnosis not present

## 2020-04-24 DIAGNOSIS — Z1589 Genetic susceptibility to other disease: Secondary | ICD-10-CM

## 2020-04-24 LAB — CBC WITH DIFFERENTIAL/PLATELET
Abs Immature Granulocytes: 0.05 10*3/uL (ref 0.00–0.07)
Basophils Absolute: 0 10*3/uL (ref 0.0–0.1)
Basophils Relative: 1 %
Eosinophils Absolute: 0.8 10*3/uL — ABNORMAL HIGH (ref 0.0–0.5)
Eosinophils Relative: 11 %
HCT: 36.3 % (ref 36.0–46.0)
Hemoglobin: 10.2 g/dL — ABNORMAL LOW (ref 12.0–15.0)
Immature Granulocytes: 1 %
Lymphocytes Relative: 11 %
Lymphs Abs: 0.8 10*3/uL (ref 0.7–4.0)
MCH: 24 pg — ABNORMAL LOW (ref 26.0–34.0)
MCHC: 28.1 g/dL — ABNORMAL LOW (ref 30.0–36.0)
MCV: 85.4 fL (ref 80.0–100.0)
Monocytes Absolute: 0.5 10*3/uL (ref 0.1–1.0)
Monocytes Relative: 7 %
Neutro Abs: 4.8 10*3/uL (ref 1.7–7.7)
Neutrophils Relative %: 70 %
Platelets: 513 10*3/uL — ABNORMAL HIGH (ref 150–400)
RBC: 4.25 MIL/uL (ref 3.87–5.11)
RDW: 19.4 % — ABNORMAL HIGH (ref 11.5–15.5)
WBC: 6.9 10*3/uL (ref 4.0–10.5)
nRBC: 0 % (ref 0.0–0.2)

## 2020-04-24 LAB — IRON AND TIBC
Iron: 34 ug/dL — ABNORMAL LOW (ref 41–142)
Saturation Ratios: 10 % — ABNORMAL LOW (ref 21–57)
TIBC: 338 ug/dL (ref 236–444)
UIBC: 304 ug/dL (ref 120–384)

## 2020-04-24 LAB — CMP (CANCER CENTER ONLY)
ALT: 13 U/L (ref 0–44)
AST: 18 U/L (ref 15–41)
Albumin: 4.2 g/dL (ref 3.5–5.0)
Alkaline Phosphatase: 67 U/L (ref 38–126)
Anion gap: 7 (ref 5–15)
BUN: 47 mg/dL — ABNORMAL HIGH (ref 8–23)
CO2: 24 mmol/L (ref 22–32)
Calcium: 9.4 mg/dL (ref 8.9–10.3)
Chloride: 107 mmol/L (ref 98–111)
Creatinine: 1.97 mg/dL — ABNORMAL HIGH (ref 0.44–1.00)
GFR, Estimated: 24 mL/min — ABNORMAL LOW (ref >60)
Glucose, Bld: 174 mg/dL — ABNORMAL HIGH (ref 70–99)
Potassium: 4.3 mmol/L (ref 3.5–5.1)
Sodium: 138 mmol/L (ref 135–145)
Total Bilirubin: 0.5 mg/dL (ref 0.3–1.2)
Total Protein: 6.9 g/dL (ref 6.5–8.1)

## 2020-04-24 LAB — FERRITIN: Ferritin: 38 ng/mL (ref 11–307)

## 2020-04-24 NOTE — Telephone Encounter (Signed)
Scheduled appointment per 3/7 los. Spoke to patient who is aware of appointment date and time.  °

## 2020-05-03 ENCOUNTER — Other Ambulatory Visit: Payer: Self-pay | Admitting: Internal Medicine

## 2020-05-03 DIAGNOSIS — G40909 Epilepsy, unspecified, not intractable, without status epilepticus: Secondary | ICD-10-CM

## 2020-05-03 DIAGNOSIS — G2581 Restless legs syndrome: Secondary | ICD-10-CM

## 2020-05-03 DIAGNOSIS — R42 Dizziness and giddiness: Secondary | ICD-10-CM

## 2020-05-03 DIAGNOSIS — K219 Gastro-esophageal reflux disease without esophagitis: Secondary | ICD-10-CM

## 2020-05-03 MED ORDER — ROPINIROLE HCL 1 MG PO TABS: ORAL_TABLET | ORAL | 1 refills | Status: DC

## 2020-05-03 MED ORDER — MECLIZINE HCL 25 MG PO TABS: ORAL_TABLET | ORAL | 1 refills | Status: DC

## 2020-05-03 MED ORDER — LEVETIRACETAM 500 MG PO TABS
ORAL_TABLET | ORAL | 1 refills | Status: DC
Start: 2020-05-03 — End: 2020-05-08

## 2020-05-03 MED ORDER — ROPINIROLE HCL 1 MG PO TABS
ORAL_TABLET | ORAL | 1 refills | Status: DC
Start: 2020-05-03 — End: 2020-05-03

## 2020-05-03 MED ORDER — LANSOPRAZOLE 30 MG PO CPDR: DELAYED_RELEASE_CAPSULE | ORAL | 1 refills | Status: DC

## 2020-05-04 NOTE — Progress Notes (Signed)
History of Present Illness:       This very nice 87 y.o. WWF presents for 6 month follow up with HTN, HLD, Insulin dependent T2_DM, Hypothyroidism, Seizure Disorder  and Vitamin D Deficiency. Patient has Gout controlled with her meds. Patient is on CPAP for OSA with improved sleep hygiene.        Patient was recently hospitalized with a Hgb 6.2 gm% receiving 2 u pRBC attributed to a multifactorial anemia from her CKD4, GI Bleeding.  Patient did have EGD & Colonoscopy during admissionPatient is followed by HemeOncologist - Dr Irene Limbo for iron infusions and also is followed by  Nephrologist  - Dr  Harrie Jeans.        Patient is treated for HTN (1999) & BP has been controlled at home. Today's BP is at goal - 120/68. Patient has had no complaints of any cardiac type chest pain, palpitations, dyspnea / orthopnea / PND, dizziness, claudication, or dependent edema.       Hyperlipidemia is controlled with diet & meds. Patient denies myalgias or other med SE's. Last Lipids were at goal:  Lab Results  Component Value Date   CHOL 126 02/02/2020   HDL 30 (L) 02/02/2020   LDLCALC 77 02/02/2020   TRIG 102 02/02/2020   CHOLHDL 4.2 02/02/2020     Also, the patient has Morbid Obesity (BMI 37+) and consequent insulin requiring T2_DM  (2001)  w/CKD4  (GFR 24) and has had no symptoms of reactive hypoglycemia, diabetic polys, or visual blurring, but does have numb  Paresthesias of her LE's .  Last A1c was at goal:  Lab Results  Component Value Date   HGBA1C 5.4 04/04/2020                                         Patient  has been on thyroid replacement since the 1980's for Hypothyroidism.         Further, the patient also has history of Vitamin D Deficiency ("37"/2008) and supplements vitamin D without any suspected side-effects. Last vitamin D was not at goal (70-100):  Lab Results  Component Value Date   VD25OH 56 11/01/2019    Current Outpatient Medications on File Prior to Visit   Medication Sig  . Acetaminophen 500 MG tablet Take 1,000 mg every 6  hours as needed   . allopurinol  100 MG tablet Take 1 tablet daily to prevent gout.   Marland Kitchen aspirin 81 MG tablet Take  daily.  Marland Kitchen atenolol  50 MG tablet Take     1 tablet     Daily    . VITAMIN D  1,000 u Take daily.  . fish oil-omega-3  1000 MG cap Take   at bedtime.  . furosemide  40 MG tablet TAKE 1 TABLET  DAILY   . ANUSOL-HC 2.5 % rectal cream Place rectally 2 times daily.  Marland Kitchen HUMULIN 70/30 Inject 20 Units into the skin 2  times daily   . lansoprazole  30 MG capsule Take  1 capsule  Daily   . levETIRAcetam (KEPPRA) 500 MG  Take  1 tablet  3 x /day  with Meals  to Prevent Seizures  . levothyroxine  100 MCG tablet TAKE 1 TABLET DAILY   . loratadine  10 MG tablet Take 10 mg by mouth daily.  . meclizine  25 MG tablet Take  1  tablet 3 x /day  Only if needed   . rOPINIRole (REQUIP) 1 MG tablet Take  1/2 to 1 tablet  3 x /day  as needed f  . vitamin B-12  100 MCG tablet Take  daily.   . vitamin C 500 MG tablet Take  daily.     Allergies  Allergen Reactions  . Ppd [Tuberculin Purified Protein Derivative]     indurated    PMHx:   Past Medical History:  Diagnosis Date  . Anemia, unspecified   . Arthritis    "knees; right shoulder" (09/15/2013)  . Basal cell carcinoma    "right upper outer lip"  . DM neuropathy, type II diabetes mellitus ([place])   . Dyspnea 2021   with low iron  . GERD (gastroesophageal reflux disease)   . Gout   . High cholesterol   . Hypertension   . Hypothyroidism   . Meniere's disease   . Obesity (BMI 30-39.9)   . Other forms of epilepsy and recurrent seizures without mention of intractable epilepsy 11/04/2012   Non convulsive paroxysmal spells, responding to [facility], patient not driving.   . Pneumonia ~ 1943  . Skin cancer    "forehead; right hand"  . Stage 4 chronic kidney disease ([place])   . UTI (urinary tract infection)   . Vitamin D deficiency     Immunization History  Administered  Date(s) Administered  . DTaP 03/18/2007  . Influenza, High Dose Seasonal PF 10/18/2013, 12/12/2014, 10/24/2015, 10/28/2016, 12/08/2017, 12/01/2018  . Influenza-Unspecified 11/18/2012, 12/02/2019  . PFIZER(Purple Top)SARS-COV-2 Vaccination 03/10/2019, 03/31/2019, 11/26/2019  . Pneumococcal Conjugate-13 12/12/2014  . Pneumococcal Polysaccharide-23 09/16/2013  . Td 12/08/2017  . Zoster 02/28/2005    Past Surgical History:  Procedure Laterality Date  . BIOPSY  04/05/2020   Procedure: BIOPSY;  Surgeon: Jerene Bears, MD;  Location: [facility];  Service: Gastroenterology;;  . CERVICAL POLYPECTOMY    . COLONOSCOPY WITH PROPOFOL N/A 04/06/2020   Procedure: COLONOSCOPY WITH PROPOFOL;  Surgeon: Jerene Bears, MD;  Location: [facility];  Service: Gastroenterology;  Laterality: N/A;  . ESOPHAGOGASTRODUODENOSCOPY (EGD) WITH PROPOFOL N/A 04/05/2020   Procedure: ESOPHAGOGASTRODUODENOSCOPY (EGD) WITH PROPOFOL;  Surgeon: Jerene Bears, MD;  Location: [facility];  Service: Gastroenterology;  Laterality: N/A;  . HAMMER TOE SURGERY Left 1980's  . MOHS SURGERY  20087   "right upper outer lip"  . POLYPECTOMY  04/06/2020   Procedure: POLYPECTOMY;  Surgeon: Jerene Bears, MD;  Location: [facility];  Service: Gastroenterology;;  . TONSILLECTOMY  ~ 1947  . WRIST SURGERY Left ~ 1950   "ran arm thru window"    FHx:    Reviewed / unchanged  SHx:    Reviewed / unchanged   Systems Review:  Constitutional: Denies fever, chills, wt changes, headaches, insomnia, fatigue, night sweats, change in appetite. Eyes: Denies redness, blurred vision, diplopia, discharge, itchy, watery eyes.  ENT: Denies discharge, congestion, post nasal drip, epistaxis, sore throat, earache, hearing loss, dental pain, tinnitus, vertigo, sinus pain, snoring.  CV: Denies chest pain, palpitations, irregular heartbeat, syncope, dyspnea, diaphoresis, orthopnea, PND, claudication or edema. Respiratory: denies cough, dyspnea, DOE,  pleurisy, hoarseness, laryngitis, wheezing.  Gastrointestinal: Denies dysphagia, odynophagia, heartburn, reflux, water brash, abdominal pain or cramps, nausea, vomiting, bloating, diarrhea, constipation, hematemesis, melena, hematochezia  or hemorrhoids. Genitourinary: Denies dysuria, frequency, urgency, nocturia, hesitancy, discharge, hematuria or flank pain. Musculoskeletal: Denies arthralgias, myalgias, stiffness, jt. swelling, pain, limping or strain/sprain.  Skin: Denies pruritus, rash, hives, warts, acne, eczema or change in skin  lesion(s). Neuro: No weakness, tremor, incoordination, spasms, paresthesia or pain. Psychiatric: Denies confusion, memory loss or sensory loss. Endo: Denies change in weight, skin or hair change.  Heme/Lymph: No excessive bleeding, bruising or enlarged lymph nodes.  Physical Exam  BP 120/68   Pulse 78   Temp 97.9 F (36.6 C)   Resp 16   Ht 5\' 2"  (1.575 m)   Wt 186 lb 9.6 oz (84.6 kg)   SpO2 99%   BMI 34.13 kg/m   Appears  over nourished  and in no distress.  Eyes: PERRLA, EOMs, conjunctiva no swelling or erythema. Sinuses: No frontal/maxillary tenderness ENT/Mouth: EAC's clear, TM's nl w/o erythema, bulging. Nares clear w/o erythema, swelling, exudates. Oropharynx clear without erythema or exudates. Oral hygiene is good. Tongue normal, non obstructing. Hearing intact.  Neck: Supple. Thyroid not palpable. Car 2+/2+ without bruits, nodes or JVD. Chest: Respirations nl with BS clear & equal w/o rales, rhonchi, wheezing or stridor.  Cor: Heart sounds normal w/ regular rate and rhythm without sig. murmurs, gallops, clicks or rubs. Peripheral pulses normal and equal  without edema.  Abdomen: Soft & bowel sounds normal. Non-tender w/o guarding, rebound, hernias, masses or organomegaly.  Lymphatics: Unremarkable.  Musculoskeletal: Full ROM all peripheral extremities, joint stability, 5/5 strength and normal gait.  Skin: Warm, dry without exposed rashes,  lesions or ecchymosis apparent.  Neuro: Cranial nerves intact, reflexes equal bilaterally. Sensory-motor testing grossly intact. Tendon reflexes grossly intact.  Pysch: Alert & oriented x 3.  Insight and judgement nl & appropriate. No ideations.  Assessment and Plan:  1. Essential hypertension  - Continue medication, monitor blood pressure at home.  - Continue DASH diet.  Reminder to go to the ER if any CP,  SOB, nausea, dizziness, severe HA, changes vision/speech.  - CBC with Differential/Platelet - COMPLETE METABOLIC PANEL WITH GFR - Magnesium - TSH  2. Hyperlipidemia associated with type 2 diabetes mellitus ([place])  - Continue diet/meds, exercise,& lifestyle modifications.  - Continue monitor periodic cholesterol/liver & renal functions   - Lipid panel - TSH  3. Type 2 diabetes mellitus with stage 4 chronic kidney  disease, with long-term current use of insulin (HCC)  - Continue diet, exercise  - Lifestyle modifications.  - Monitor appropriate labs  - Hemoglobin A1c  4. Vitamin D deficiency  - Continue supplementation.  - VITAMIN D 25 Hydroxy  5. Secondary hyperparathyroidism of renal origin (HCC)  - PTH, intact and calcium  6. Seizure disorder (HCC)  - COMPLETE METABOLIC PANEL WITH GFR - Levetiracetam level  7. OSA on CPAP   8. Hypothyroidism, unspecified type  - TSH  9. Gout, tophaceous  - Uric acid  10. Medication management  - CBC with Differential/Platelet - COMPLETE METABOLIC PANEL WITH GFR - Magnesium - Lipid panel - TSH - Hemoglobin A1c  - Levetiracetam level - PTH, intact and calcium - Uric acid         Discussed  regular exercise, BP monitoring, weight control to achieve/maintain BMI less than 25 and discussed med and SE's. Recommended labs to assess and monitor clinical status with further disposition pending results of labs.  I discussed the assessment and treatment plan with the patient. The patient was provided an opportunity to  ask questions and all were answered. The patient agreed with the plan and demonstrated an understanding of the instructions.  I provided over 30 minutes of exam, counseling, chart review and  complex critical decision making.         The  patient was advised to call back or seek an in-person evaluation if the symptoms worsen or if the condition fails to improve as anticipated.   Kirtland Bouchard, MD

## 2020-05-05 ENCOUNTER — Encounter: Payer: Self-pay | Admitting: Internal Medicine

## 2020-05-05 ENCOUNTER — Other Ambulatory Visit: Payer: Self-pay

## 2020-05-05 ENCOUNTER — Ambulatory Visit: Payer: Medicare Other | Admitting: Internal Medicine

## 2020-05-05 VITALS — BP 120/68 | HR 78 | Temp 97.9°F | Resp 16 | Ht 62.0 in | Wt 186.6 lb

## 2020-05-05 DIAGNOSIS — E1122 Type 2 diabetes mellitus with diabetic chronic kidney disease: Secondary | ICD-10-CM

## 2020-05-05 DIAGNOSIS — E785 Hyperlipidemia, unspecified: Secondary | ICD-10-CM

## 2020-05-05 DIAGNOSIS — I10 Essential (primary) hypertension: Secondary | ICD-10-CM

## 2020-05-05 DIAGNOSIS — E1169 Type 2 diabetes mellitus with other specified complication: Secondary | ICD-10-CM

## 2020-05-05 DIAGNOSIS — E559 Vitamin D deficiency, unspecified: Secondary | ICD-10-CM

## 2020-05-05 DIAGNOSIS — Z794 Long term (current) use of insulin: Secondary | ICD-10-CM

## 2020-05-05 DIAGNOSIS — E039 Hypothyroidism, unspecified: Secondary | ICD-10-CM

## 2020-05-05 DIAGNOSIS — G40909 Epilepsy, unspecified, not intractable, without status epilepticus: Secondary | ICD-10-CM

## 2020-05-05 DIAGNOSIS — N184 Chronic kidney disease, stage 4 (severe): Secondary | ICD-10-CM

## 2020-05-05 DIAGNOSIS — N2581 Secondary hyperparathyroidism of renal origin: Secondary | ICD-10-CM

## 2020-05-05 DIAGNOSIS — G4733 Obstructive sleep apnea (adult) (pediatric): Secondary | ICD-10-CM

## 2020-05-05 DIAGNOSIS — Z9989 Dependence on other enabling machines and devices: Secondary | ICD-10-CM

## 2020-05-05 DIAGNOSIS — M1A9XX1 Chronic gout, unspecified, with tophus (tophi): Secondary | ICD-10-CM

## 2020-05-05 DIAGNOSIS — Z79899 Other long term (current) drug therapy: Secondary | ICD-10-CM

## 2020-05-05 NOTE — Patient Instructions (Signed)

## 2020-05-06 NOTE — Progress Notes (Signed)
============================================================ -   Test results slightly outside the reference range are not unusual. If there is anything important, I will review this with you,  otherwise it is considered normal test values.  If you have further questions,  please do not hesitate to contact me at the office or via My Chart.  ============================================================ ============================================================  -  Mild anemia due to Kidney Disease is Stable  ============================================================ ============================================================  -  Kidney Disease - Still Stage 4  (GFR 24)  and Stable over the last 3 years  ============================================================ ============================================================  -  Total chol = 110  and LDL = 63  Excellent             (On No cholesterol meds  !  )   - Very low risk for Heart Attack  / Stroke ========================================================  - A1c = 6.6% - is up again   - Avoid Sweets, Candy & White Stuff   - Rice, Potatoes, Breads &  Pasta ============================================================ ============================================================  -  Vitamin D = 49 - OK  ============================================================ ============================================================  -  Uric acid test is down from 14.8 to now 8.7, But  Still too High                                                                                           ( Goal is less than 7)   - Please be sure taking your Allopurinol to Prevent a Painful Gout Attack ============================================================ ============================================================  -  Keppra level still pending   ============================================================ ============================================================

## 2020-05-08 ENCOUNTER — Other Ambulatory Visit: Payer: Self-pay | Admitting: Internal Medicine

## 2020-05-08 DIAGNOSIS — G40909 Epilepsy, unspecified, not intractable, without status epilepticus: Secondary | ICD-10-CM

## 2020-05-08 DIAGNOSIS — R42 Dizziness and giddiness: Secondary | ICD-10-CM

## 2020-05-08 DIAGNOSIS — K219 Gastro-esophageal reflux disease without esophagitis: Secondary | ICD-10-CM

## 2020-05-08 DIAGNOSIS — G2581 Restless legs syndrome: Secondary | ICD-10-CM

## 2020-05-08 MED ORDER — ROPINIROLE HCL 1 MG PO TABS: ORAL_TABLET | ORAL | 1 refills | Status: DC

## 2020-05-08 MED ORDER — MECLIZINE HCL 25 MG PO TABS: ORAL_TABLET | ORAL | 1 refills | Status: DC

## 2020-05-08 MED ORDER — LANSOPRAZOLE 30 MG PO CPDR: DELAYED_RELEASE_CAPSULE | ORAL | 1 refills | Status: DC

## 2020-05-08 MED ORDER — LEVETIRACETAM 500 MG PO TABS: ORAL_TABLET | ORAL | 1 refills | Status: DC

## 2020-05-09 LAB — COMPLETE METABOLIC PANEL WITH GFR
AG Ratio: 1.9 (calc) (ref 1.0–2.5)
ALT: 9 U/L (ref 6–29)
AST: 11 U/L (ref 10–35)
Albumin: 4.3 g/dL (ref 3.6–5.1)
Alkaline phosphatase (APISO): 58 U/L (ref 37–153)
BUN/Creatinine Ratio: 26 (calc) — ABNORMAL HIGH (ref 6–22)
BUN: 49 mg/dL — ABNORMAL HIGH (ref 7–25)
CO2: 24 mmol/L (ref 20–32)
Calcium: 9.9 mg/dL (ref 8.6–10.4)
Chloride: 108 mmol/L (ref 98–110)
Creat: 1.86 mg/dL — ABNORMAL HIGH (ref 0.60–0.88)
GFR, Est African American: 28 mL/min/{1.73_m2} — ABNORMAL LOW (ref >=60)
GFR, Est Non African American: 24 mL/min/{1.73_m2} — ABNORMAL LOW (ref >=60)
Globulin: 2.3 g/dL (calc) (ref 1.9–3.7)
Glucose, Bld: 198 mg/dL — ABNORMAL HIGH (ref 65–99)
Potassium: 4.7 mmol/L (ref 3.5–5.3)
Sodium: 142 mmol/L (ref 135–146)
Total Bilirubin: 0.5 mg/dL (ref 0.2–1.2)
Total Protein: 6.6 g/dL (ref 6.1–8.1)

## 2020-05-09 LAB — CBC WITH DIFFERENTIAL/PLATELET
Absolute Monocytes: 562 cells/uL (ref 200–950)
Basophils Absolute: 37 cells/uL (ref 0–200)
Basophils Relative: 0.5 %
Eosinophils Absolute: 1007 cells/uL — ABNORMAL HIGH (ref 15–500)
Eosinophils Relative: 13.8 %
HCT: 37.5 % (ref 35.0–45.0)
Hemoglobin: 10.7 g/dL — ABNORMAL LOW (ref 11.7–15.5)
Lymphs Abs: 745 cells/uL — ABNORMAL LOW (ref 850–3900)
MCH: 23.8 pg — ABNORMAL LOW (ref 27.0–33.0)
MCHC: 28.5 g/dL — ABNORMAL LOW (ref 32.0–36.0)
MCV: 83.5 fL (ref 80.0–100.0)
MPV: 9.9 fL (ref 7.5–12.5)
Monocytes Relative: 7.7 %
Neutro Abs: 4949 cells/uL (ref 1500–7800)
Neutrophils Relative %: 67.8 %
Platelets: 502 10*3/uL — ABNORMAL HIGH (ref 140–400)
RBC: 4.49 10*6/uL (ref 3.80–5.10)
RDW: 18.1 % — ABNORMAL HIGH (ref 11.0–15.0)
Total Lymphocyte: 10.2 %
WBC: 7.3 10*3/uL (ref 3.8–10.8)

## 2020-05-09 LAB — PTH, INTACT AND CALCIUM
Calcium: 9.9 mg/dL (ref 8.6–10.4)
PTH: 88 pg/mL — ABNORMAL HIGH (ref 16–77)

## 2020-05-09 LAB — LIPID PANEL
Cholesterol: 110 mg/dL (ref <200)
HDL: 29 mg/dL — ABNORMAL LOW (ref >=50)
LDL Cholesterol (Calc): 63 mg/dL (calc)
Non-HDL Cholesterol (Calc): 81 mg/dL (calc) (ref <130)
Total CHOL/HDL Ratio: 3.8 (calc) (ref <5.0)
Triglycerides: 101 mg/dL (ref <150)

## 2020-05-09 LAB — TSH: TSH: 2.64 mIU/L (ref 0.40–4.50)

## 2020-05-09 LAB — URIC ACID: Uric Acid, Serum: 8.7 mg/dL — ABNORMAL HIGH (ref 2.5–7.0)

## 2020-05-09 LAB — HEMOGLOBIN A1C
Hgb A1c MFr Bld: 6.6 % of total Hgb — ABNORMAL HIGH (ref <5.7)
Mean Plasma Glucose: 143 mg/dL
eAG (mmol/L): 7.9 mmol/L

## 2020-05-09 LAB — VITAMIN D 25 HYDROXY (VIT D DEFICIENCY, FRACTURES): Vit D, 25-Hydroxy: 49 ng/mL (ref 30–100)

## 2020-05-09 LAB — MAGNESIUM: Magnesium: 2.4 mg/dL (ref 1.5–2.5)

## 2020-05-09 LAB — LEVETIRACETAM LEVEL: Keppra (Levetiracetam): 27 ug/mL (ref 12.0–46.0)

## 2020-05-09 NOTE — Progress Notes (Signed)
============================================================== ==============================================================  -    Keppra Level  (Levetiracetam) FINALLY returned & is a a good level, So . Marland Kitchen Marland Kitchen                                                                        Please continue Keppra dose same   ============================================================== ==============================================================

## 2020-05-19 ENCOUNTER — Other Ambulatory Visit: Payer: Self-pay | Admitting: Internal Medicine

## 2020-05-19 DIAGNOSIS — K219 Gastro-esophageal reflux disease without esophagitis: Secondary | ICD-10-CM

## 2020-05-19 DIAGNOSIS — G40909 Epilepsy, unspecified, not intractable, without status epilepticus: Secondary | ICD-10-CM

## 2020-05-19 DIAGNOSIS — R42 Dizziness and giddiness: Secondary | ICD-10-CM

## 2020-05-19 MED ORDER — LEVETIRACETAM 500 MG PO TABS: ORAL_TABLET | ORAL | 3 refills | Status: DC

## 2020-05-19 MED ORDER — LANSOPRAZOLE 30 MG PO CPDR: DELAYED_RELEASE_CAPSULE | ORAL | 3 refills | Status: DC

## 2020-05-19 MED ORDER — MECLIZINE HCL 25 MG PO TABS
ORAL_TABLET | ORAL | 1 refills | Status: DC
Start: 2020-05-19 — End: 2020-10-16

## 2020-05-26 ENCOUNTER — Telehealth: Payer: Self-pay | Admitting: Hematology

## 2020-05-26 NOTE — Telephone Encounter (Signed)
Left message with rescheduled upcoming appointment due to provider's PAL. Gave option to call back to reschedule if needed. 

## 2020-05-29 ENCOUNTER — Other Ambulatory Visit: Payer: Self-pay | Admitting: Adult Health Nurse Practitioner

## 2020-05-29 DIAGNOSIS — L97511 Non-pressure chronic ulcer of other part of right foot limited to breakdown of skin: Secondary | ICD-10-CM

## 2020-06-05 ENCOUNTER — Inpatient Hospital Stay: Payer: Medicare Other

## 2020-06-05 ENCOUNTER — Other Ambulatory Visit: Payer: Self-pay | Admitting: *Deleted

## 2020-06-05 ENCOUNTER — Inpatient Hospital Stay: Payer: Medicare Other | Admitting: Hematology

## 2020-06-05 DIAGNOSIS — G40909 Epilepsy, unspecified, not intractable, without status epilepticus: Secondary | ICD-10-CM

## 2020-06-05 MED ORDER — LEVETIRACETAM 500 MG PO TABS: ORAL_TABLET | ORAL | 3 refills | Status: DC

## 2020-06-13 ENCOUNTER — Other Ambulatory Visit: Payer: Self-pay

## 2020-06-13 ENCOUNTER — Emergency Department (HOSPITAL_BASED_OUTPATIENT_CLINIC_OR_DEPARTMENT_OTHER): Payer: Medicare Other

## 2020-06-13 ENCOUNTER — Emergency Department (HOSPITAL_BASED_OUTPATIENT_CLINIC_OR_DEPARTMENT_OTHER)
Admission: EM | Admit: 2020-06-13 | Discharge: 2020-06-13 | Disposition: A | Discharge status: Home / Self Care | Payer: Medicare Other | Attending: Emergency Medicine | Admitting: Emergency Medicine

## 2020-06-13 DIAGNOSIS — E1122 Type 2 diabetes mellitus with diabetic chronic kidney disease: Secondary | ICD-10-CM | POA: Insufficient documentation

## 2020-06-13 DIAGNOSIS — Z85048 Personal history of other malignant neoplasm of rectum, rectosigmoid junction, and anus: Secondary | ICD-10-CM | POA: Diagnosis not present

## 2020-06-13 DIAGNOSIS — S0990XA Unspecified injury of head, initial encounter: Secondary | ICD-10-CM

## 2020-06-13 DIAGNOSIS — I129 Hypertensive chronic kidney disease with stage 1 through stage 4 chronic kidney disease, or unspecified chronic kidney disease: Secondary | ICD-10-CM | POA: Diagnosis not present

## 2020-06-13 DIAGNOSIS — E1140 Type 2 diabetes mellitus with diabetic neuropathy, unspecified: Secondary | ICD-10-CM | POA: Diagnosis not present

## 2020-06-13 DIAGNOSIS — Z7982 Long term (current) use of aspirin: Secondary | ICD-10-CM | POA: Diagnosis not present

## 2020-06-13 DIAGNOSIS — Y9301 Activity, walking, marching and hiking: Secondary | ICD-10-CM | POA: Insufficient documentation

## 2020-06-13 DIAGNOSIS — E039 Hypothyroidism, unspecified: Secondary | ICD-10-CM | POA: Diagnosis not present

## 2020-06-13 DIAGNOSIS — Z87891 Personal history of nicotine dependence: Secondary | ICD-10-CM | POA: Insufficient documentation

## 2020-06-13 DIAGNOSIS — W19XXXA Unspecified fall, initial encounter: Secondary | ICD-10-CM

## 2020-06-13 DIAGNOSIS — Z794 Long term (current) use of insulin: Secondary | ICD-10-CM | POA: Diagnosis not present

## 2020-06-13 DIAGNOSIS — Z85828 Personal history of other malignant neoplasm of skin: Secondary | ICD-10-CM | POA: Insufficient documentation

## 2020-06-13 DIAGNOSIS — W01198A Fall on same level from slipping, tripping and stumbling with subsequent striking against other object, initial encounter: Secondary | ICD-10-CM | POA: Insufficient documentation

## 2020-06-13 DIAGNOSIS — N184 Chronic kidney disease, stage 4 (severe): Secondary | ICD-10-CM | POA: Insufficient documentation

## 2020-06-13 DIAGNOSIS — Z79899 Other long term (current) drug therapy: Secondary | ICD-10-CM | POA: Insufficient documentation

## 2020-06-13 DIAGNOSIS — S0003XA Contusion of scalp, initial encounter: Secondary | ICD-10-CM | POA: Diagnosis not present

## 2020-06-13 NOTE — ED Provider Notes (Signed)
[facility] EMERGENCY DEPT Provider Note   CSN: [id number] Arrival date & time: 06/13/20  1251     History Chief Complaint  Patient presents with  . Fall    [name] [name] is a 87 y.o. female.  HPI Patient presents after fall.  Was walking on the driveway and fell backwards landing onto her buttocks and then hit her head.  No loss conscious.  Has a hematoma on the back of her head.  No loss consciousness.  Is on aspirin only without other anticoagulation.  No neck pain.  No numbness weakness.  Only complaint is the mild headache.  No vision changes.    Past Medical History:  Diagnosis Date  . Anemia, unspecified   . Arthritis    "knees; right shoulder" (09/15/2013)  . Basal cell carcinoma    "right upper outer lip"  . DM neuropathy, type II diabetes mellitus ([place])   . Dyspnea 2021   with low iron  . GERD (gastroesophageal reflux disease)   . Gout   . High cholesterol   . Hypertension   . Hypothyroidism   . Meniere's disease   . Obesity (BMI 30-39.9)   . Other forms of epilepsy and recurrent seizures without mention of intractable epilepsy 11/04/2012   Non convulsive paroxysmal spells, responding to [facility], patient not driving.   . Pneumonia ~ 1943  . Skin cancer    "forehead; right hand"  . Stage 4 chronic kidney disease ([place])   . UTI (urinary tract infection)   . Vitamin D deficiency     Patient Active Problem List   Diagnosis Date Noted  . Benign neoplasm of cecum   . Benign neoplasm of rectum   . Blood in stool   . Melena   . Acute blood loss anemia   . GI bleed 04/04/2020  . Symptomatic anemia 11/02/2019  . DM neuropathy, type II diabetes mellitus ([place])   . Stage 4 chronic kidney disease ([place])   . Obesity (BMI 30-39.9)   . Hypertension   . High cholesterol   . Aortic valve thickening by Cardiac echo in 2015 11/01/2019  . Iron deficiency anemia 05/10/2019  . Secondary hyperparathyroidism of renal origin ([place]) 09/20/2018  . Type 2  diabetes mellitus with stage 4 chronic kidney disease, with long-term current use of insulin ([place]) 12/08/2017  . ACE inhibitor intolerance 12/08/2017  . Vertigo of central origin 11/20/2015  . Morbid obesity ([place]) BMI 35+ with T2DM, htn, hyperlipidemia, CKD, OSA 12/12/2014  . OSA on CPAP 08/08/2014  . Type II diabetes mellitus with neurological manifestations ([place]) 09/15/2013  . Hyperlipidemia associated with type 2 diabetes mellitus ([place]) 06/27/2013  . Vitamin D deficiency 06/27/2013  . Seizure disorder ([place]) 11/04/2012  . Epilepsy ([place]) 11/04/2012  . Anemia 06/13/2008  . COLONIC POLYPS 06/09/2008  . Hypothyroidism 06/09/2008  . CKD stage 4 due to type 2 diabetes mellitus ([place]) 06/09/2008  . Venous (peripheral) insufficiency 06/09/2008  . Gastroesophageal reflux disease 06/09/2008  . Essential hypertension 06/09/2008    Past Surgical History:  Procedure Laterality Date  . BIOPSY  04/05/2020   Procedure: BIOPSY;  Surgeon: Jerene Bears, MD;  Location: [facility];  Service: Gastroenterology;;  . CERVICAL POLYPECTOMY    . COLONOSCOPY WITH PROPOFOL N/A 04/06/2020   Procedure: COLONOSCOPY WITH PROPOFOL;  Surgeon: Jerene Bears, MD;  Location: [facility];  Service: Gastroenterology;  Laterality: N/A;  . ESOPHAGOGASTRODUODENOSCOPY (EGD) WITH PROPOFOL N/A 04/05/2020   Procedure: ESOPHAGOGASTRODUODENOSCOPY (EGD) WITH PROPOFOL;  Surgeon: Hilarie Fredrickson,  [name], MD;  Location: [facility];  Service: Gastroenterology;  Laterality: N/A;  . HAMMER TOE SURGERY Left 1980's  . MOHS SURGERY  [address]87   "right upper outer lip"  . POLYPECTOMY  04/06/2020   Procedure: POLYPECTOMY;  Surgeon: Jerene Bears, MD;  Location: [facility];  Service: Gastroenterology;;  . TONSILLECTOMY  ~ 1947  . WRIST SURGERY Left ~ 1950   "ran arm thru window"     OB History   No obstetric history on file.     Family History  Problem Relation Age of Onset  . Heart disease Mother   . Stroke Mother   . Heart disease Father    . Ovarian cancer Sister   . Hypertension Son   . Hypertension Son   . Diabetes Son   . Alcohol abuse Son     Social History   Tobacco Use  . Smoking status: Former Smoker    Packs/day: 1.00    Years: 30.00    Pack years: 30.00    Types: Cigarettes    Quit date: 06/29/1983    Years since quitting: 36.9  . Smokeless tobacco: Never Used  Vaping Use  . Vaping Use: Never used  Substance Use Topics  . Alcohol use: Yes    Alcohol/week: 0.0 standard drinks    Comment: 09/15/2013 "glass of wine maybe once/year"  . Drug use: No    Home Medications Prior to Admission medications   Medication Sig Start Date End Date Taking? Authorizing Provider  acetaminophen (TYLENOL) 500 MG tablet Take 1,000 mg by mouth every 6 (six) hours as needed for moderate pain.   Yes [provider]  allopurinol (ZYLOPRIM) 100 MG tablet Take 1 tablet daily to prevent gout. Patient taking differently: Take 100 mg by mouth daily as needed (gout). 11/17/19  Yes Unk Pinto, MD  aspirin 81 MG tablet Take 81 mg by mouth daily.   Yes [provider]  atenolol (TENORMIN) 50 MG tablet Take     1 tablet     Daily       for BP Patient taking differently: Take 50 mg by mouth daily. 12/27/19  Yes Unk Pinto, MD  cholecalciferol (VITAMIN D3) 25 MCG (1000 UNIT) tablet Take 1,000 Units by mouth daily.   Yes [provider]  fish oil-omega-3 fatty acids 1000 MG capsule Take 1,000 mg by mouth at bedtime.   Yes [provider]  furosemide (LASIX) 40 MG tablet Take  1 tablet  Daily  for Fluid Retention & Ankle Swelling 05/29/20  Yes Unk Pinto, MD  insulin NPH-regular Human (HUMULIN 70/30) (70-30) 100 UNIT/ML injection Inject 20 Units into the skin 2 (two) times daily with a meal. 02/02/20  Yes McClanahan, Kyra, NP  lansoprazole (PREVACID) 30 MG capsule Take  1 capsule  Daily  to Prevent Heartburn & Indigestion 05/19/20  Yes Unk Pinto, MD  levETIRAcetam (KEPPRA) 500 MG tablet  Take  1 tablet  3 x /day  with Meals  to Prevent Seizures 06/05/20  Yes Unk Pinto, MD  levothyroxine (SYNTHROID) 100 MCG tablet TAKE 1 TABLET BY MOUTH DAILY ON AN EMPTY STOMACH WITH ONLY WATER FOR 30 MINUTES. NO ANTACIDS, CALCIUM OR MAGNESIUM FOR 4 HOURS. AVOID BIOTIN Patient taking differently: Take 100 mcg by mouth daily before breakfast. 01/24/20  Yes Unk Pinto, MD  loratadine (CLARITIN) 10 MG tablet Take 10 mg by mouth daily.   Yes [provider]  meclizine (ANTIVERT) 25 MG tablet Take  1 tablet 3 x /  day  Only if needed for Dizziness or Vertigo 05/19/20  Yes Unk Pinto, MD  rOPINIRole (REQUIP) 1 MG tablet Take  1/2 to 1 tablet  3 x /day  as needed for Restless Legs & Cramps 05/08/20  Yes Unk Pinto, MD  vitamin B-12 (CYANOCOBALAMIN) 100 MCG tablet Take 100 mcg by mouth daily.    Yes [provider]  vitamin C (ASCORBIC ACID) 500 MG tablet Take 500 mg by mouth daily.   Yes [provider]  glucose blood (ONETOUCH VERIO) test strip Check blood sugar 3 times daily-[id number] 04/05/19   Unk Pinto, MD  hydrocortisone (ANUSOL-HC) 2.5 % rectal cream Place rectally 2 (two) times daily. 04/06/20   Kayleen Memos, DO  Lancets [facility] ULTRASOFT) lancets Insulin dependent check sugars 2-3 x a day- 3 month supply 09/15/19   Vladimir Crofts, PA-C    Allergies    Ppd [tuberculin purified protein derivative]  Review of Systems   Review of Systems  Constitutional: Negative for appetite change and fever.  Cardiovascular: Negative for chest pain.  Gastrointestinal: Negative for abdominal pain.  Genitourinary: Negative for flank pain.  Musculoskeletal: Negative for back pain.  Skin: Negative for wound.  Neurological: Positive for headaches. Negative for weakness.  Psychiatric/Behavioral: Negative for confusion.    Physical Exam Updated Vital Signs BP (!) 134/46 (BP Location: Right Arm)   Pulse 77   Temp 98.1 F (36.7 C) (Oral)   Resp 16   Ht  5\' 2"  (1.575 m)   Wt 83.9 kg   SpO2 91%   BMI 33.84 kg/m   Physical Exam Vitals and nursing note reviewed.  HENT:     Head:     Comments: Hematoma to left occipital area.  Mild abrasion.  No clear laceration.    Mouth/Throat:     Mouth: Mucous membranes are moist.  Eyes:     Pupils: Pupils are equal, round, and reactive to light.  Neck:     Comments: No midline tenderness. Cardiovascular:     Rate and Rhythm: Regular rhythm.  Pulmonary:     Breath sounds: No wheezing or rhonchi.  Abdominal:     Tenderness: There is no abdominal tenderness.  Musculoskeletal:        General: No tenderness.     Cervical back: Neck supple.     Comments: No lumbar tenderness.  Skin:    General: Skin is warm.     Capillary Refill: Capillary refill takes less than 2 seconds.  Neurological:     Mental Status: She is alert and oriented to person, place, and time.     ED Results / Procedures / Treatments   Labs (all labs ordered are listed, but only abnormal results are displayed) Labs Reviewed - No data to display  EKG None  Radiology CT Head Wo Contrast  Result Date: 06/13/2020 CLINICAL DATA:  Tripped and fell with trauma to the back of the head. EXAM: CT HEAD WITHOUT CONTRAST TECHNIQUE: Contiguous axial images were obtained from the base of the skull through the vertex without intravenous contrast. COMPARISON:  None. FINDINGS: Brain: 11/16/2017 Vascular: There is atherosclerotic calcification of the major vessels at the base of the brain. Skull: No fracture. Sinuses/Orbits: Clear except for a small fluid level in the left maxillary sinus. Orbits negative. Other: None IMPRESSION: No acute or traumatic intracranial finding. Atherosclerotic calcification of the major vessels at the base of the brain. No skull fracture. Posterior scalp hematoma. Electronically Signed   By: Jan Fireman.D.  On: 06/13/2020 14:14    Procedures Procedures   Medications Ordered in ED Medications - No data to  display  ED Course  I have reviewed the triage vital signs and the nursing notes.  Pertinent labs & imaging results that were available during my care of the patient were reviewed by me and considered in my medical decision making (see chart for details).    MDM Rules/Calculators/A&P                         Patient tripped and fell.  Hit back of head.  No other complaints.  Negative head CT.  No lumbar tenderness.  No extremity tenderness.  Awake and at baseline.  Appears to be mechanical fall and does not appear to need further work-up as the cause. Initial differential diagnosis included intracranial hemorrhage, laceration, cervical spine fracture. Final Clinical Impression(s) / ED Diagnoses Final diagnoses:  Fall, initial encounter  Minor head injury, initial encounter    Rx / DC Orders ED Discharge Orders    None       Davonna Belling, MD 06/13/20 1457

## 2020-06-13 NOTE — ED Triage Notes (Signed)
Pt tripped and fell backwards landing on her buttocks then back onto her head. The back of her head hit the pavement in the driveway. Pt. Is not on thinners, pt denies LOC or dizziness. Pt stated that she just lost her footing.

## 2020-06-20 ENCOUNTER — Other Ambulatory Visit: Payer: Medicare Other

## 2020-06-20 ENCOUNTER — Ambulatory Visit: Payer: Medicare Other | Admitting: Hematology
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
## 2020-06-21 ENCOUNTER — Other Ambulatory Visit: Payer: Self-pay

## 2020-06-21 ENCOUNTER — Inpatient Hospital Stay: Payer: Medicare Other | Attending: Hematology

## 2020-06-21 ENCOUNTER — Inpatient Hospital Stay: Payer: Medicare Other | Admitting: Hematology

## 2020-06-21 ENCOUNTER — Telehealth: Payer: Self-pay | Admitting: Hematology

## 2020-06-21 VITALS — BP 125/72 | HR 73 | Temp 97.7°F | Resp 18 | Ht 62.0 in | Wt 186.1 lb

## 2020-06-21 DIAGNOSIS — E1122 Type 2 diabetes mellitus with diabetic chronic kidney disease: Secondary | ICD-10-CM | POA: Insufficient documentation

## 2020-06-21 DIAGNOSIS — I129 Hypertensive chronic kidney disease with stage 1 through stage 4 chronic kidney disease, or unspecified chronic kidney disease: Secondary | ICD-10-CM | POA: Insufficient documentation

## 2020-06-21 DIAGNOSIS — N189 Chronic kidney disease, unspecified: Secondary | ICD-10-CM | POA: Insufficient documentation

## 2020-06-21 DIAGNOSIS — D631 Anemia in chronic kidney disease: Secondary | ICD-10-CM | POA: Diagnosis present

## 2020-06-21 DIAGNOSIS — Z1589 Genetic susceptibility to other disease: Secondary | ICD-10-CM

## 2020-06-21 DIAGNOSIS — D471 Chronic myeloproliferative disease: Secondary | ICD-10-CM | POA: Diagnosis not present

## 2020-06-21 DIAGNOSIS — E039 Hypothyroidism, unspecified: Secondary | ICD-10-CM | POA: Diagnosis not present

## 2020-06-21 LAB — CMP (CANCER CENTER ONLY)
ALT: 8 U/L (ref 0–44)
AST: 15 U/L (ref 15–41)
Albumin: 4 g/dL (ref 3.5–5.0)
Alkaline Phosphatase: 68 U/L (ref 38–126)
Anion gap: 9 (ref 5–15)
BUN: 49 mg/dL — ABNORMAL HIGH (ref 8–23)
CO2: 22 mmol/L (ref 22–32)
Calcium: 10 mg/dL (ref 8.9–10.3)
Chloride: 111 mmol/L (ref 98–111)
Creatinine: 2.04 mg/dL — ABNORMAL HIGH (ref 0.44–1.00)
GFR, Estimated: 23 mL/min — ABNORMAL LOW (ref >60)
Glucose, Bld: 178 mg/dL — ABNORMAL HIGH (ref 70–99)
Potassium: 4.5 mmol/L (ref 3.5–5.1)
Sodium: 142 mmol/L (ref 135–145)
Total Bilirubin: 0.4 mg/dL (ref 0.3–1.2)
Total Protein: 6.9 g/dL (ref 6.5–8.1)

## 2020-06-21 LAB — CBC WITH DIFFERENTIAL/PLATELET
Abs Immature Granulocytes: 0.05 10*3/uL (ref 0.00–0.07)
Basophils Absolute: 0 10*3/uL (ref 0.0–0.1)
Basophils Relative: 1 %
Eosinophils Absolute: 0.9 10*3/uL — ABNORMAL HIGH (ref 0.0–0.5)
Eosinophils Relative: 13 %
HCT: 36.9 % (ref 36.0–46.0)
Hemoglobin: 10.9 g/dL — ABNORMAL LOW (ref 12.0–15.0)
Immature Granulocytes: 1 %
Lymphocytes Relative: 11 %
Lymphs Abs: 0.7 10*3/uL (ref 0.7–4.0)
MCH: 23.7 pg — ABNORMAL LOW (ref 26.0–34.0)
MCHC: 29.5 g/dL — ABNORMAL LOW (ref 30.0–36.0)
MCV: 80.2 fL (ref 80.0–100.0)
Monocytes Absolute: 0.5 10*3/uL (ref 0.1–1.0)
Monocytes Relative: 7 %
Neutro Abs: 4.6 10*3/uL (ref 1.7–7.7)
Neutrophils Relative %: 67 %
Platelets: 387 10*3/uL (ref 150–400)
RBC: 4.6 MIL/uL (ref 3.87–5.11)
RDW: 22.9 % — ABNORMAL HIGH (ref 11.5–15.5)
WBC: 6.8 10*3/uL (ref 4.0–10.5)
nRBC: 0 % (ref 0.0–0.2)

## 2020-06-21 NOTE — Telephone Encounter (Signed)
Scheduled follow-up appointment per 5/4 los. Patient is aware. ?

## 2020-06-27 ENCOUNTER — Ambulatory Visit: Payer: Medicare Other | Admitting: Internal Medicine

## 2020-06-28 ENCOUNTER — Ambulatory Visit: Payer: Medicare Other | Admitting: Gastroenterology

## 2020-06-28 ENCOUNTER — Encounter: Payer: Self-pay | Admitting: Gastroenterology

## 2020-06-28 VITALS — BP 110/60 | HR 69 | Ht 62.0 in | Wt 189.0 lb

## 2020-06-28 DIAGNOSIS — D509 Iron deficiency anemia, unspecified: Secondary | ICD-10-CM | POA: Diagnosis not present

## 2020-06-28 NOTE — Progress Notes (Signed)
Noted  

## 2020-06-28 NOTE — Progress Notes (Signed)
06/28/2020 [name] [name] [id number] 05-04-1932   HISTORY OF PRESENT ILLNESS:  This is an 87 year old female who is a patient of Dr. Blanch Media.  She is here today for a hospital follow-up.  She was seen by her service back in February for evaluation regarding iron deficiency anemia and heme positive stools.  She underwent EGD and colonoscopy by Dr. Hilarie Fredrickson.  Colonoscopy revealed of couple polyps that were removed, diverticulosis, and internal/external hemorrhoids.  Polyps were tubular adenomas.  EGD revealed only chronic appearing gastritis.  Biopsy showed mild chronic gastritis with reactive gastropathy, negative for H. Pylori.  Most recent hemoglobin 1 week ago was 10.9 g.  She follows with Dr. Irene Limbo who recommended no iron supplementation for now.  She denies any evidence of black or bloody stools.  She says that she feels well.   Past Medical History:  Diagnosis Date  . Anemia, unspecified   . Arthritis    "knees; right shoulder" (09/15/2013)  . Basal cell carcinoma    "right upper outer lip"  . DM neuropathy, type II diabetes mellitus ([place])   . Dyspnea 2021   with low iron  . GERD (gastroesophageal reflux disease)   . Gout   . High cholesterol   . Hypertension   . Hypothyroidism   . Meniere's disease   . Obesity (BMI 30-39.9)   . Other forms of epilepsy and recurrent seizures without mention of intractable epilepsy 11/04/2012   Non convulsive paroxysmal spells, responding to [facility], patient not driving.   . Pneumonia ~ 1943  . Skin cancer    "forehead; right hand"  . Stage 4 chronic kidney disease ([place])   . UTI (urinary tract infection)   . Vitamin D deficiency    Past Surgical History:  Procedure Laterality Date  . BIOPSY  04/05/2020   Procedure: BIOPSY;  Surgeon: Jerene Bears, MD;  Location: [facility];  Service: Gastroenterology;;  . CERVICAL POLYPECTOMY    . COLONOSCOPY WITH PROPOFOL N/A 04/06/2020   Procedure: COLONOSCOPY WITH PROPOFOL;  Surgeon: Jerene Bears,  MD;  Location: [facility];  Service: Gastroenterology;  Laterality: N/A;  . ESOPHAGOGASTRODUODENOSCOPY (EGD) WITH PROPOFOL N/A 04/05/2020   Procedure: ESOPHAGOGASTRODUODENOSCOPY (EGD) WITH PROPOFOL;  Surgeon: Jerene Bears, MD;  Location: [facility];  Service: Gastroenterology;  Laterality: N/A;  . HAMMER TOE SURGERY Left 1980's  . MOHS SURGERY  [address]87   "right upper outer lip"  . POLYPECTOMY  04/06/2020   Procedure: POLYPECTOMY;  Surgeon: Jerene Bears, MD;  Location: [facility];  Service: Gastroenterology;;  . TONSILLECTOMY  ~ 1947  . WRIST SURGERY Left ~ 1950   "ran arm thru window"    reports that she quit smoking about 37 years ago. Her smoking use included cigarettes. She has a 30.00 pack-year smoking history. She has never used smokeless tobacco. She reports current alcohol use. She reports that she does not use drugs. family history includes Alcohol abuse in her son; Diabetes in her son; Heart disease in her father and mother; Hypertension in her son and son; Ovarian cancer in her sister; Stroke in her mother. Allergies  Allergen Reactions  . Ppd [Tuberculin Purified Protein Derivative] Other (See Comments)    indurated      Outpatient Encounter Medications as of 06/28/2020  Medication Sig  . acetaminophen (TYLENOL) 500 MG tablet Take 1,000 mg by mouth every 6 (six) hours as needed for moderate pain.  Marland Kitchen allopurinol (ZYLOPRIM) 100 MG tablet Take 1 tablet daily to  prevent gout. (Patient taking differently: Take 100 mg by mouth daily as needed (gout).)  . aspirin 81 MG tablet Take 81 mg by mouth daily.  Marland Kitchen atenolol (TENORMIN) 50 MG tablet Take     1 tablet     Daily       for BP (Patient taking differently: Take 50 mg by mouth daily.)  . cholecalciferol (VITAMIN D3) 25 MCG (1000 UNIT) tablet Take 1,000 Units by mouth daily.  . fish oil-omega-3 fatty acids 1000 MG capsule Take 1,000 mg by mouth at bedtime.  . furosemide (LASIX) 40 MG tablet Take  1 tablet  Daily  for Fluid Retention  & Ankle Swelling  . glucose blood (ONETOUCH VERIO) test strip Check blood sugar 3 times daily-DX-E11.22  . hydrocortisone (ANUSOL-HC) 2.5 % rectal cream Place rectally 2 (two) times daily.  . insulin NPH-regular Human (HUMULIN 70/30) (70-30) 100 UNIT/ML injection Inject 20 Units into the skin 2 (two) times daily with a meal.  . Lancets (ONETOUCH ULTRASOFT) lancets Insulin dependent check sugars 2-3 x a day- 3 month supply  . lansoprazole (PREVACID) 30 MG capsule Take  1 capsule  Daily  to Prevent Heartburn & Indigestion  . levETIRAcetam (KEPPRA) 500 MG tablet Take  1 tablet  3 x /day  with Meals  to Prevent Seizures  . levothyroxine (SYNTHROID) 100 MCG tablet TAKE 1 TABLET BY MOUTH DAILY ON AN EMPTY STOMACH WITH ONLY WATER FOR 30 MINUTES. NO ANTACIDS, CALCIUM OR MAGNESIUM FOR 4 HOURS. AVOID BIOTIN (Patient taking differently: Take 100 mcg by mouth daily before breakfast.)  . loratadine (CLARITIN) 10 MG tablet Take 10 mg by mouth daily.  . meclizine (ANTIVERT) 25 MG tablet Take  1 tablet 3 x /day  Only if needed for Dizziness or Vertigo  . rOPINIRole (REQUIP) 1 MG tablet Take  1/2 to 1 tablet  3 x /day  as needed for Restless Legs & Cramps  . vitamin B-12 (CYANOCOBALAMIN) 100 MCG tablet Take 100 mcg by mouth daily.   . vitamin C (ASCORBIC ACID) 500 MG tablet Take 500 mg by mouth daily.   No facility-administered encounter medications on file as of 06/28/2020.    REVIEW OF SYSTEMS  : All other systems reviewed and negative except where noted in the History of Present Illness.   PHYSICAL EXAM: BP 110/60   Pulse 69   Ht 5\' 2"  (1.575 m)   Wt 189 lb (85.7 kg)   BMI 34.57 kg/m  General: Well developed white female in no acute distress Head: Normocephalic and atraumatic Eyes:  Sclerae anicteric, conjunctiva pink. Ears: Normal auditory acuity Lungs: Clear throughout to auscultation; no W/R/R. Heart: Regular rate and rhythm; loud murmur noted. Abdomen: Soft, non-distended.  BS present.   Non-tender.   Musculoskeletal: Symmetrical with no gross deformities  Skin: No lesions on visible extremities Extremities: No edema  Neurological: Alert oriented x 4, grossly non-focal Psychological:  Alert and cooperative. Normal mood and affect  ASSESSMENT AND PLAN: *IDA:  Multifactorial.  Sees Dr. Irene Limbo.  Hgb is stable.  EGD and colonoscopy 03/2020 without source of bleeding identified.  No iron supplementation at this time per hematology.  We will plan for video capsule endoscopy only if hemoglobin continues to downtrend and/or/evidence of GI bleeding.  CBC and iron studies will be monitored by hematology.  Follow-up here as needed.  CC:  Unk Pinto, MD

## 2020-06-28 NOTE — Patient Instructions (Signed)
If you are age 85 or older, your body mass index should be between 23-30. Your Body mass index is 34.57 kg/m. If this is out of the aforementioned range listed, please consider follow up with your Primary Care Provider.  If you are age 39 or younger, your body mass index should be between 19-25. Your Body mass index is 34.57 kg/m. If this is out of the aformentioned range listed, please consider follow up with your Primary Care Provider.   Follow up as needed and contact us for any bleeding concerns.

## 2020-09-13 ENCOUNTER — Other Ambulatory Visit: Payer: Self-pay | Admitting: Internal Medicine

## 2020-09-13 DIAGNOSIS — M1A9XX1 Chronic gout, unspecified, with tophus (tophi): Secondary | ICD-10-CM

## 2020-09-13 MED ORDER — ALLOPURINOL 100 MG PO TABS: ORAL_TABLET | ORAL | 3 refills | Status: DC

## 2020-10-16 ENCOUNTER — Other Ambulatory Visit: Payer: Self-pay | Admitting: Adult Health Nurse Practitioner

## 2020-10-16 ENCOUNTER — Other Ambulatory Visit: Payer: Self-pay

## 2020-10-16 DIAGNOSIS — E1122 Type 2 diabetes mellitus with diabetic chronic kidney disease: Secondary | ICD-10-CM

## 2020-10-16 DIAGNOSIS — R42 Dizziness and giddiness: Secondary | ICD-10-CM

## 2020-10-16 MED ORDER — ATENOLOL 50 MG PO TABS: 50.0000 mg | ORAL_TABLET | Freq: Every day | ORAL | 2 refills | Status: DC

## 2020-10-16 MED ORDER — MECLIZINE HCL 25 MG PO TABS: ORAL_TABLET | ORAL | 1 refills | Status: DC

## 2020-10-17 ENCOUNTER — Other Ambulatory Visit: Payer: Self-pay | Admitting: Internal Medicine

## 2020-10-17 DIAGNOSIS — G2581 Restless legs syndrome: Secondary | ICD-10-CM

## 2020-10-17 MED ORDER — ROPINIROLE HCL 1 MG PO TABS: ORAL_TABLET | ORAL | 3 refills | Status: DC

## 2020-10-20 ENCOUNTER — Other Ambulatory Visit: Payer: Self-pay

## 2020-10-20 ENCOUNTER — Inpatient Hospital Stay: Payer: Medicare Other | Attending: Hematology | Admitting: Hematology

## 2020-10-20 ENCOUNTER — Inpatient Hospital Stay: Payer: Medicare Other

## 2020-10-20 VITALS — BP 126/44 | HR 70 | Temp 98.8°F | Resp 18 | Wt 189.6 lb

## 2020-10-20 DIAGNOSIS — Z1589 Genetic susceptibility to other disease: Secondary | ICD-10-CM

## 2020-10-20 DIAGNOSIS — N189 Chronic kidney disease, unspecified: Secondary | ICD-10-CM | POA: Diagnosis not present

## 2020-10-20 DIAGNOSIS — D471 Chronic myeloproliferative disease: Secondary | ICD-10-CM | POA: Diagnosis not present

## 2020-10-20 DIAGNOSIS — D631 Anemia in chronic kidney disease: Secondary | ICD-10-CM | POA: Insufficient documentation

## 2020-10-20 LAB — CMP (CANCER CENTER ONLY)
ALT: 9 U/L (ref 0–44)
AST: 13 U/L — ABNORMAL LOW (ref 15–41)
Albumin: 4.1 g/dL (ref 3.5–5.0)
Alkaline Phosphatase: 69 U/L (ref 38–126)
Anion gap: 10 (ref 5–15)
BUN: 46 mg/dL — ABNORMAL HIGH (ref 8–23)
CO2: 22 mmol/L (ref 22–32)
Calcium: 9.3 mg/dL (ref 8.9–10.3)
Chloride: 110 mmol/L (ref 98–111)
Creatinine: 2.01 mg/dL — ABNORMAL HIGH (ref 0.44–1.00)
GFR, Estimated: 24 mL/min — ABNORMAL LOW (ref >60)
Glucose, Bld: 172 mg/dL — ABNORMAL HIGH (ref 70–99)
Potassium: 4.7 mmol/L (ref 3.5–5.1)
Sodium: 142 mmol/L (ref 135–145)
Total Bilirubin: 0.5 mg/dL (ref 0.3–1.2)
Total Protein: 6.8 g/dL (ref 6.5–8.1)

## 2020-10-20 LAB — CBC WITH DIFFERENTIAL/PLATELET
Abs Immature Granulocytes: 0.03 10*3/uL (ref 0.00–0.07)
Basophils Absolute: 0 10*3/uL (ref 0.0–0.1)
Basophils Relative: 0 %
Eosinophils Absolute: 0.8 10*3/uL — ABNORMAL HIGH (ref 0.0–0.5)
Eosinophils Relative: 14 %
HCT: 36.7 % (ref 36.0–46.0)
Hemoglobin: 11.2 g/dL — ABNORMAL LOW (ref 12.0–15.0)
Immature Granulocytes: 1 %
Lymphocytes Relative: 13 %
Lymphs Abs: 0.7 10*3/uL (ref 0.7–4.0)
MCH: 26 pg (ref 26.0–34.0)
MCHC: 30.5 g/dL (ref 30.0–36.0)
MCV: 85.2 fL (ref 80.0–100.0)
Monocytes Absolute: 0.4 10*3/uL (ref 0.1–1.0)
Monocytes Relative: 7 %
Neutro Abs: 3.8 10*3/uL (ref 1.7–7.7)
Neutrophils Relative %: 65 %
Platelets: 321 10*3/uL (ref 150–400)
RBC: 4.31 MIL/uL (ref 3.87–5.11)
RDW: 20.8 % — ABNORMAL HIGH (ref 11.5–15.5)
WBC: 5.9 10*3/uL (ref 4.0–10.5)
nRBC: 0 % (ref 0.0–0.2)

## 2020-10-20 LAB — IRON AND TIBC
Iron: 70 ug/dL (ref 41–142)
Saturation Ratios: 28 % (ref 21–57)
TIBC: 250 ug/dL (ref 236–444)
UIBC: 180 ug/dL (ref 120–384)

## 2020-10-20 LAB — FERRITIN: Ferritin: 28 ng/mL (ref 11–307)

## 2020-10-27 ENCOUNTER — Encounter: Payer: Self-pay | Admitting: Hematology

## 2020-10-27 ENCOUNTER — Encounter (HOSPITAL_COMMUNITY): Payer: Self-pay
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
## 2020-11-03 ENCOUNTER — Other Ambulatory Visit: Payer: Self-pay

## 2020-11-03 ENCOUNTER — Ambulatory Visit (INDEPENDENT_AMBULATORY_CARE_PROVIDER_SITE_OTHER): Payer: Medicare Other | Admitting: Internal Medicine

## 2020-11-03 VITALS — BP 120/68 | HR 74 | Temp 97.6°F | Resp 16 | Ht 62.0 in | Wt 186.4 lb

## 2020-11-03 DIAGNOSIS — Z0000 Encounter for general adult medical examination without abnormal findings: Secondary | ICD-10-CM

## 2020-11-03 DIAGNOSIS — Z136 Encounter for screening for cardiovascular disorders: Secondary | ICD-10-CM | POA: Diagnosis not present

## 2020-11-03 DIAGNOSIS — Z8249 Family history of ischemic heart disease and other diseases of the circulatory system: Secondary | ICD-10-CM | POA: Diagnosis not present

## 2020-11-03 DIAGNOSIS — Z0001 Encounter for general adult medical examination with abnormal findings: Secondary | ICD-10-CM

## 2020-11-03 DIAGNOSIS — G40909 Epilepsy, unspecified, not intractable, without status epilepticus: Secondary | ICD-10-CM

## 2020-11-03 DIAGNOSIS — G4733 Obstructive sleep apnea (adult) (pediatric): Secondary | ICD-10-CM

## 2020-11-03 DIAGNOSIS — Z9989 Dependence on other enabling machines and devices: Secondary | ICD-10-CM

## 2020-11-03 DIAGNOSIS — E1169 Type 2 diabetes mellitus with other specified complication: Secondary | ICD-10-CM

## 2020-11-03 DIAGNOSIS — E785 Hyperlipidemia, unspecified: Secondary | ICD-10-CM

## 2020-11-03 DIAGNOSIS — E1149 Type 2 diabetes mellitus with other diabetic neurological complication: Secondary | ICD-10-CM

## 2020-11-03 DIAGNOSIS — E039 Hypothyroidism, unspecified: Secondary | ICD-10-CM

## 2020-11-03 DIAGNOSIS — Z794 Long term (current) use of insulin: Secondary | ICD-10-CM

## 2020-11-03 DIAGNOSIS — I10 Essential (primary) hypertension: Secondary | ICD-10-CM

## 2020-11-03 DIAGNOSIS — K219 Gastro-esophageal reflux disease without esophagitis: Secondary | ICD-10-CM

## 2020-11-03 DIAGNOSIS — E1122 Type 2 diabetes mellitus with diabetic chronic kidney disease: Secondary | ICD-10-CM

## 2020-11-03 DIAGNOSIS — Z79899 Other long term (current) drug therapy: Secondary | ICD-10-CM

## 2020-11-03 DIAGNOSIS — Z1211 Encounter for screening for malignant neoplasm of colon: Secondary | ICD-10-CM

## 2020-11-03 DIAGNOSIS — N2581 Secondary hyperparathyroidism of renal origin: Secondary | ICD-10-CM

## 2020-11-03 DIAGNOSIS — M1A9XX1 Chronic gout, unspecified, with tophus (tophi): Secondary | ICD-10-CM

## 2020-11-03 DIAGNOSIS — E559 Vitamin D deficiency, unspecified: Secondary | ICD-10-CM

## 2020-11-03 MED ORDER — ONETOUCH VERIO W/DEVICE KIT: PACK | 1 refills | Status: DC

## 2020-11-03 MED ORDER — ONETOUCH ULTRASOFT LANCETS MISC: 3 refills | Status: DC

## 2020-11-03 MED ORDER — ONETOUCH VERIO VI STRP: ORAL_STRIP | 3 refills | Status: DC

## 2020-11-03 NOTE — Patient Instructions (Signed)

## 2020-11-03 NOTE — Progress Notes (Signed)
Annual Screening/Preventative Visit & Comprehensive Evaluation &  Examination  Future Appointments  Date Time Provider [place]  11/03/2020   -   CPE 11:00 AM Unk Pinto, MD [facility] None  02/02/2021  -  Wellness 10:30 AM Magda Bernheim, NP [facility] None  02/21/2021 10:20 AM Brunetta Genera, MD [facility] None  11/05/2021   -   CPE  11:00 AM Unk Pinto, MD [facility] None        This very nice 87 y.o. WWF presents for a Screening /Preventative Visit & comprehensive evaluation and management of multiple medical co-morbidities.  Patient has been followed for HTN, HLD, T2_NIDDM/CKD4  and Vitamin D Deficiency. Patient has Gout controlled on her meds.  Patient is also followed by Dr Brett Fairy for a Seizure Disorder, by Dr Irene Limbo for anemia presumed of Kidney disease. Patient also is on CPAP for OSA with improved restorative Sleep.         HTN predates since 1999. Patient's BP has been controlled at home . Cardiac echo in 2015 showed thickened Ao valve leaflets w/o stenosis or regurgitation.  Patient denies any cardiac symptoms as chest pain, palpitations, shortness of breath, dizziness or ankle swelling. Today's BP is at goal - 120/68.        Patient's hyperlipidemia is controlled with diet and medications. Patient denies myalgias or other medication SE's. Last lipids were at goal:  Lab Results  Component Value Date   CHOL 110 05/05/2020   HDL 29 (L) 05/05/2020   LDLCALC 63 05/05/2020   TRIG 101 05/05/2020   CHOLHDL 3.8 05/05/2020         Patient has Morbid Obesity  (BMI  37 -> 34+) and consequent T2_NIDDM (2001) w/CKD4  (GFR 23). Patient is on bid Novolin 70/30 reporting CBG's usu ranging betw 100-180 mg%.   Patient has Secondary Hyperparathyroidism consequent of her Diabetic Kidney Disease.   Patient denies reactive hypoglycemic symptoms, visual blurring, diabetic polys or paresthesias. Last A1c was not at goal:  Lab Results  Component Value Date   HGBA1C 6.6  (H) 05/05/2020         Finally, patient has history of Vitamin D Deficiency ("37" /2008) and last Vitamin D was still low (Goal 70-100):   Lab Results  Component Value Date   VD25OH 49 05/05/2020     Current Outpatient Medications on File Prior to Visit  Medication Sig   acetaminophen 500 MG tablet Take 1,000 mg every 6 (six) hours as needed for moderate pain.   allopurinol 100 MG tablet Take  1 tablet  Daily  to prevent Gout.   aspirin 81 MG tablet Take  daily.   atenolol =50 MG tablet Take 1 tablet ( daily.   VITAMIN D 1000 u Take  daily.   fish oil-omega-3 1000 MG capsule Take 1,000 mg  at bedtime.   furosemide (LASIX) 40 MG tablet Take  1 tablet  Daily  for Fluid Retention & Ankle Swelling   HUMULIN (70/30) injec INJECT 20 UNITS TWICE DAILY    ANUSOL-HC 2.5 % rectal cream Place rectally 2  times daily.   lansoprazole 30 MG capsule Take  1 capsule  Daily     levETIRAcetam (KEPPRA) 500 MG  Take  1 tablet  3 x /day  with Meals    levothyroxine 100 MCG tablet TAKE 1 TABLET DAILY    loratadine (CLARITIN) 10 MG tablet Take daily.   meclizine (ANTIVERT) 25 MG tablet Take  1 tablet 3 x /day  Only  if needed    rOPINIRole (REQUIP) 1 MG tablet Take  1/2 to 1 tablet  3 x /day  as needed    vitamin B-12 100 MCG tablet Take  daily.    vitamin C 500 MG tablet Take daily.     Allergies  Allergen Reactions   Ppd [Tuberculin Purified Protein Derivative] Other (See Comments)    indurated    Past Medical History:  Diagnosis Date   Anemia, unspecified    Arthritis    "knees; right shoulder" (09/15/2013)   Basal cell carcinoma    "right upper outer lip"   DM neuropathy, type II diabetes mellitus ([place])    Dyspnea 2021   with low iron   GERD (gastroesophageal reflux disease)    Gout    High cholesterol    Hypertension    Hypothyroidism    Meniere's disease    Obesity (BMI 30-39.9)    Other forms of epilepsy and recurrent seizures without mention of intractable epilepsy 11/04/2012    Non convulsive paroxysmal spells, responding to [facility], patient not driving.    Pneumonia ~ 1943   Skin cancer    "forehead; right hand"   Stage 4 chronic kidney disease ([place])    UTI (urinary tract infection)    Vitamin D deficiency      Health Maintenance  Topic Date Due   Zoster Vaccines- Shingrix (1 of 2) Never done   COVID-19 Vaccine (4 - Booster for Pfizer series) 02/18/2020   INFLUENZA VACCINE  09/18/2020   FOOT EXAM  10/30/2020   URINE MICROALBUMIN  10/31/2020   HEMOGLOBIN A1C  11/05/2020   OPHTHALMOLOGY EXAM  12/26/2020   TETANUS/TDAP  12/09/2027   DEXA SCAN  Completed   PNA vac Low Risk Adult  Completed   HPV VACCINES  Aged Out     Immunization History  Administered Date(s) Administered   DTaP 03/18/2007   Influenza, High Dose   10/18/2013, 12/12/2014, 10/24/2015, 10/28/2016, 12/08/2017, 12/01/2018   Influenza  11/18/2012, 12/02/2019   PFIZER SARS-COV -2 Vacc  03/10/2019, 03/31/2019, 11/26/2019   Pneumococcal -13 12/12/2014   Pneumococcal -23 09/16/2013   Td 12/08/2017   Zoster, Live 02/28/2005     Last Colon - 08/01/2008 - Dr Henrene Pastor - recc 5 yr f/u , but patient deferred   Last MGM - 11/16/2017 - Patient aware overdue having been sent reminder letters in 2019 and 2020   Past Surgical History:  Procedure Laterality Date   BIOPSY  04/05/2020   Procedure: BIOPSY;  Surgeon: Jerene Bears, MD;  Location: [facility];  Service: Gastroenterology;;   CERVICAL POLYPECTOMY     COLONOSCOPY WITH PROPOFOL N/A 04/06/2020   Procedure: COLONOSCOPY WITH PROPOFOL;  Surgeon: Jerene Bears, MD;  Location: [facility];  Service: Gastroenterology;  Laterality: N/A;   ESOPHAGOGASTRODUODENOSCOPY (EGD) WITH PROPOFOL N/A 04/05/2020   Procedure: ESOPHAGOGASTRODUODENOSCOPY (EGD) WITH PROPOFOL;  Surgeon: Jerene Bears, MD;  Location: [facility];  Service: Gastroenterology;  Laterality: N/A;   HAMMER TOE SURGERY Left 1980's   MOHS SURGERY  20087   "right upper outer lip"    POLYPECTOMY  04/06/2020   Procedure: POLYPECTOMY;  Surgeon: Jerene Bears, MD;  Location: [facility];  Service: Gastroenterology;;   TONSILLECTOMY  ~ 1947   WRIST SURGERY Left ~ 1950   "ran arm thru window"     Family History  Problem Relation Age of Onset   Heart disease Mother    Stroke Mother    Heart disease Father  Ovarian cancer Sister    Hypertension Son    Hypertension Son    Diabetes Son    Alcohol abuse Son      Social History   Tobacco Use   Smoking status: Former    Packs/day: 1.00    Years: 30.00    Pack years: 30.00    Types: Cigarettes    Quit date: 06/29/1983    Years since quitting: 37.3   Smokeless tobacco: Never  Vaping Use   Vaping Use: Never used  Substance Use Topics   Alcohol use: Yes    Alcohol/week: 0.0 standard drinks    Comment: 09/15/2013 "glass of wine maybe once/year"   Drug use: No      ROS Constitutional: Denies fever, chills, weight loss/gain, headaches, insomnia,  night sweats, and change in appetite. Does c/o fatigue. Eyes: Denies redness, blurred vision, diplopia, discharge, itchy, watery eyes.  ENT: Denies discharge, congestion, post nasal drip, epistaxis, sore throat, earache, hearing loss, dental pain, Tinnitus, Vertigo, Sinus pain, snoring.  Cardio: Denies chest pain, palpitations, irregular heartbeat, syncope, dyspnea, diaphoresis, orthopnea, PND, claudication, edema Respiratory: denies cough, dyspnea, DOE, pleurisy, hoarseness, laryngitis, wheezing.  Gastrointestinal: Denies dysphagia, heartburn, reflux, water brash, pain, cramps, nausea, vomiting, bloating, diarrhea, constipation, hematemesis, melena, hematochezia, jaundice, hemorrhoids Genitourinary: Denies dysuria, frequency, urgency, nocturia, hesitancy, discharge, hematuria, flank pain Breast: Breast lumps, nipple discharge, bleeding.  Musculoskeletal: Denies arthralgia, myalgia, stiffness, Jt. Swelling, pain, limp, and strain/sprain. Denies falls. Skin: Denies  puritis, rash, hives, warts, acne, eczema, changing in skin lesion Neuro: No weakness, tremor, incoordination, spasms, paresthesia, pain Psychiatric: Denies confusion, memory loss, sensory loss. Denies Depression. Endocrine: Denies change in weight, skin, hair change, nocturia, and paresthesia, diabetic polys, visual blurring, hyper / hypo glycemic episodes.  Heme/Lymph: No excessive bleeding, bruising, enlarged lymph nodes.  Physical Exam  BP 120/68   Pulse 74   Temp 97.6 F (36.4 C)   Resp 16   Ht 5\' 2"  (1.575 m)   Wt 186 lb 6.4 oz (84.6 kg)   SpO2 97%   BMI 34.09 kg/m   General Appearance: Well nourished, well groomed and in no apparent distress.  Eyes: PERRLA, EOMs, conjunctiva no swelling or erythema, normal fundi and vessels. Sinuses: No frontal/maxillary tenderness ENT/Mouth: EACs patent / TMs  nl. Nares clear without erythema, swelling, mucoid exudates. Oral hygiene is good. No erythema, swelling, or exudate. Tongue normal, non-obstructing. Tonsils not swollen or erythematous. Hearing normal.  Neck: Supple, thyroid not palpable. No bruits, nodes or JVD. Respiratory: Respiratory effort normal.  BS equal and clear bilateral without rales, rhonci, wheezing or stridor. Cardio: Heart sounds are normal with regular rate and rhythm and a grade 2-3 systolic aortic murmur parasternal and transmitted to neck & carotids bilaterally. Peripheral pulses are normal and equal bilaterally without edema. No aortic or femoral bruits. Chest: symmetric with normal excursions and percussion. Breasts: Symmetric, without lumps, nipple discharge, retractions, or fibrocystic changes.  Abdomen: Rotund, soft with bowel sounds active. Nontender, no guarding, rebound, hernias, masses, or organomegaly.  Lymphatics: Non tender without lymphadenopathy.  Musculoskeletal: Full ROM all peripheral extremities, joint stability, 5/5 strength, and normal gait. Skin: Warm and dry without rashes, lesions, cyanosis,  clubbing or  ecchymosis.  Neuro: Cranial nerves intact, reflexes equal bilaterally. Normal muscle tone, no cerebellar symptoms. Sensation decreased to touch, vibratory and Monofilament to the toes bilaterally. Pysch: Alert and oriented X 3, normal affect, Insight and Judgment appropriate.    Assessment and Plan  1. Annual Preventative Screening Examination  2. Morbid obesity ([place]) BMI 35+ with T2DM, htn, hyperlipidemia, CKD, OSA   3. Essential hypertension  - EKG 12-Lead - Urinalysis, Routine w reflex microscopic - Microalbumin / creatinine urine ratio - CBC with Differential/Platelet - COMPLETE METABOLIC PANEL WITH GFR - Magnesium - TSH  4. Hyperlipidemia associated with type 2 diabetes mellitus (HCC)  - Lipid panel - TSH  5. Type 2 diabetes mellitus with stage 4 chronic kidney  disease, with long-term current use of insulin (HCC)  - COMPLETE METABOLIC PANEL WITH GFR - Hemoglobin A1c  6. Vitamin D deficiency  - VITAMIN D 25 Hydroxy   7. Type II diabetes mellitus with neurological manifestations (HCC)  - HM DIABETES FOOT EXAM - LOW EXTREMITY NEUR EXAM DOCUM - Hemoglobin A1c  8. Secondary hyperparathyroidism of renal origin (HCC)  - PTH, intact and calcium  9. Gout, tophaceous  - Uric acid  10. Hypothyroidism  - TSH  11. Seizure disorder (HCC)  - Levetiracetam, Immunoassay  12. OSA on CPAP   13. Gastroesophageal reflux disease  - CBC with Differential/Platelet  14. Screening for colorectal cancer  - POC Hemoccult Bld/Stl 15. Screening for ischemic heart disease  - EKG 12-Lead  16. FHx: heart disease  - EKG 12-Lead  17. Medication management  - Urinalysis, Routine w reflex microscopic - Microalbumin / creatinine urine ratio - Uric acid - PTH, intact and calcium - CBC with Differential/Platelet - COMPLETE METABOLIC PANEL WITH GFR - Magnesium - Lipid panel - TSH - Hemoglobin A1c - VITAMIN D 25 Hydroxy  - Levetiracetam,  Immunoassay        Patient was counseled in prudent diet to achieve/maintain BMI less than 25 for weight control, BP monitoring, regular exercise and medications. Discussed med's effects and SE's. Screening labs and tests as requested with regular follow-up as recommended. Over 40 minutes of exam, counseling, chart review and high complex critical decision making was performed.   Kirtland Bouchard, MD

## 2020-11-04 NOTE — Progress Notes (Signed)
============================================================ -   Test results slightly outside the reference range are not unusual. If there is anything important, I will review this with you,  otherwise it is considered normal test values.  If you have further questions,  please do not hesitate to contact me at the office or via My Chart.  ============================================================ ============================================================  -  Uric acid / Gout test is very elevated     - So recommend increase your Allopurinol 100 mg up to 2 tablets /day &   when run out early, please call office to get new Rx for more # tablets  ============================================================ ============================================================  -  CBC shows mild anemia is stable (actually slightly higher)  ============================================================ ============================================================  -  Kidney functions still in Stage 4      & holding  ============================================================ ============================================================  -  Total Chol = 108    & LDL Chol = 60    -   Both     Excellent   - Very low risk for Heart Attack  / Stroke ============================================================ ============================================================  -  A1c = 6.6% is still a little elevated   -     Ideal or Goal is less than 5.7% ============================================================ ============================================================  -  Vitamin D = 45 - OK  ============================================================ ============================================================  -  several other labs still  pending ============================================================ ============================================================

## 2020-11-05 ENCOUNTER — Encounter: Payer: Self-pay | Admitting: Internal Medicine

## 2020-11-06 LAB — URIC ACID: Uric Acid, Serum: 8.9 mg/dL — ABNORMAL HIGH (ref 2.5–7.0)

## 2020-11-06 LAB — COMPLETE METABOLIC PANEL WITH GFR
AG Ratio: 1.9 (calc) (ref 1.0–2.5)
ALT: 7 U/L (ref 6–29)
AST: 11 U/L (ref 10–35)
Albumin: 4.5 g/dL (ref 3.6–5.1)
Alkaline phosphatase (APISO): 67 U/L (ref 37–153)
BUN/Creatinine Ratio: 24 (calc) — ABNORMAL HIGH (ref 6–22)
BUN: 51 mg/dL — ABNORMAL HIGH (ref 7–25)
CO2: 22 mmol/L (ref 20–32)
Calcium: 9.5 mg/dL (ref 8.6–10.4)
Chloride: 109 mmol/L (ref 98–110)
Creat: 2.1 mg/dL — ABNORMAL HIGH (ref 0.60–0.95)
Globulin: 2.4 g/dL (calc) (ref 1.9–3.7)
Glucose, Bld: 187 mg/dL — ABNORMAL HIGH (ref 65–99)
Potassium: 4.6 mmol/L (ref 3.5–5.3)
Sodium: 140 mmol/L (ref 135–146)
Total Bilirubin: 0.4 mg/dL (ref 0.2–1.2)
Total Protein: 6.9 g/dL (ref 6.1–8.1)
eGFR: 23 mL/min/{1.73_m2} — ABNORMAL LOW (ref >=60)

## 2020-11-06 LAB — URINALYSIS, ROUTINE W REFLEX MICROSCOPIC
Bacteria, UA: NONE SEEN /HPF
Bilirubin Urine: NEGATIVE
Glucose, UA: NEGATIVE
Ketones, ur: NEGATIVE
Nitrite: NEGATIVE
RBC / HPF: NONE SEEN /HPF (ref 0–2)
Specific Gravity, Urine: 1.016 (ref 1.001–1.035)
pH: <=5 (ref 5.0–8.0)

## 2020-11-06 LAB — CBC WITH DIFFERENTIAL/PLATELET
Absolute Monocytes: 289 cells/uL (ref 200–950)
Basophils Absolute: 30 cells/uL (ref 0–200)
Basophils Relative: 0.5 %
Eosinophils Absolute: 690 cells/uL — ABNORMAL HIGH (ref 15–500)
Eosinophils Relative: 11.7 %
HCT: 39.2 % (ref 35.0–45.0)
Hemoglobin: 11.5 g/dL — ABNORMAL LOW (ref 11.7–15.5)
Lymphs Abs: 808 cells/uL — ABNORMAL LOW (ref 850–3900)
MCH: 25.4 pg — ABNORMAL LOW (ref 27.0–33.0)
MCHC: 29.3 g/dL — ABNORMAL LOW (ref 32.0–36.0)
MCV: 86.5 fL (ref 80.0–100.0)
MPV: 9.7 fL (ref 7.5–12.5)
Monocytes Relative: 4.9 %
Neutro Abs: 4083 cells/uL (ref 1500–7800)
Neutrophils Relative %: 69.2 %
Platelets: 320 10*3/uL (ref 140–400)
RBC: 4.53 10*6/uL (ref 3.80–5.10)
RDW: 19.6 % — ABNORMAL HIGH (ref 11.0–15.0)
Total Lymphocyte: 13.7 %
WBC: 5.9 10*3/uL (ref 3.8–10.8)

## 2020-11-06 LAB — PTH, INTACT AND CALCIUM
Calcium: 9.5 mg/dL (ref 8.6–10.4)
PTH: 113 pg/mL — ABNORMAL HIGH (ref 16–77)

## 2020-11-06 LAB — MICROALBUMIN / CREATININE URINE RATIO
Creatinine, Urine: 99 mg/dL (ref 20–275)
Microalb Creat Ratio: 140 mcg/mg creat — ABNORMAL HIGH (ref <30)
Microalb, Ur: 13.9 mg/dL

## 2020-11-06 LAB — MAGNESIUM: Magnesium: 2.2 mg/dL (ref 1.5–2.5)

## 2020-11-06 LAB — LIPID PANEL
Cholesterol: 108 mg/dL (ref <200)
HDL: 27 mg/dL — ABNORMAL LOW (ref >=50)
LDL Cholesterol (Calc): 60 mg/dL (calc)
Non-HDL Cholesterol (Calc): 81 mg/dL (calc) (ref <130)
Total CHOL/HDL Ratio: 4 (calc) (ref <5.0)
Triglycerides: 125 mg/dL (ref <150)

## 2020-11-06 LAB — HEMOGLOBIN A1C
Hgb A1c MFr Bld: 6.6 % of total Hgb — ABNORMAL HIGH (ref <5.7)
Mean Plasma Glucose: 143 mg/dL
eAG (mmol/L): 7.9 mmol/L

## 2020-11-06 LAB — TSH: TSH: 2.14 mIU/L (ref 0.40–4.50)

## 2020-11-06 LAB — INSULIN, RANDOM: Insulin: 53.4 u[IU]/mL — ABNORMAL HIGH

## 2020-11-06 LAB — MICROSCOPIC MESSAGE

## 2020-11-06 LAB — VITAMIN D 25 HYDROXY (VIT D DEFICIENCY, FRACTURES): Vit D, 25-Hydroxy: 45 ng/mL (ref 30–100)

## 2020-11-10 ENCOUNTER — Telehealth: Payer: Self-pay | Admitting: Internal Medicine

## 2020-11-10 NOTE — Chronic Care Management (AMB) (Signed)
  Chronic Care Management   Note  11/10/2020 Name: MERARY GARGUILO MRN: [id number] DOB: 1932/04/01  [name] is a 87 y.o. year old female who is a primary care patient of Unk Pinto, MD. I reached out to [name] by phone today in response to a referral sent by Ms. Emalene A Mennella's PCP, Unk Pinto, MD.   Ms. Aamodt was given information about Chronic Care Management services today including:  CCM service includes personalized support from designated clinical staff supervised by her physician, including individualized plan of care and coordination with other care providers 24/7 contact phone numbers for assistance for urgent and routine care needs. Service will only be billed when office clinical staff spend 20 minutes or more in a month to coordinate care. Only one practitioner may furnish and bill the service in a calendar month. The patient may stop CCM services at any time (effective at the end of the month) by phone call to the office staff.   Patient agreed to services and verbal consent obtained.   Follow up plan:   Tatjana Secretary/administrator

## 2020-11-14 ENCOUNTER — Encounter: Payer: Medicare Other | Admitting: Internal Medicine

## 2020-11-14 ENCOUNTER — Other Ambulatory Visit: Payer: Self-pay | Admitting: Internal Medicine

## 2020-11-14 DIAGNOSIS — Z1231 Encounter for screening mammogram for malignant neoplasm of breast: Secondary | ICD-10-CM

## 2020-11-16 ENCOUNTER — Ambulatory Visit: Payer: Self-pay

## 2020-11-16 ENCOUNTER — Encounter: Payer: Self-pay | Admitting: Orthopedic Surgery

## 2020-11-16 ENCOUNTER — Ambulatory Visit: Payer: Medicare Other | Admitting: Orthopedic Surgery

## 2020-11-16 DIAGNOSIS — M25562 Pain in left knee: Secondary | ICD-10-CM | POA: Diagnosis not present

## 2020-11-16 DIAGNOSIS — L97411 Non-pressure chronic ulcer of right heel and midfoot limited to breakdown of skin: Secondary | ICD-10-CM | POA: Diagnosis not present

## 2020-11-16 DIAGNOSIS — G8929 Other chronic pain: Secondary | ICD-10-CM | POA: Diagnosis not present

## 2020-11-16 DIAGNOSIS — M79671 Pain in right foot: Secondary | ICD-10-CM | POA: Diagnosis not present

## 2020-11-16 NOTE — Progress Notes (Signed)
Office Visit Note   Patient: [name] [name]           Date of Birth: [birth year]           MRN: [id number] Visit Date: 11/16/2020              Requested by: Unk Pinto, [address] [place],  [place] [id number] PCP: Unk Pinto, MD  Chief Complaint  Patient presents with   Right Foot - Pain      HPI: Patient is a 87 year old woman who presents in follow-up complaining of a ulcer on the right heel as well as some callus beneath her toes.  She states that she does have arthritic pain in the left knee.  Assessment & Plan: Visit Diagnoses:  1. Chronic pain of left knee   2. Non-pressure chronic ulcer of right heel and midfoot limited to breakdown of skin ([place])     Plan: Patient does not want to proceed with a steroid injection for the left knee secondary to the steroid raising her blood sugars.  Recommend that she could use Voltaren gel.  The ulcer was debrided on the right heel plan to follow-up in 3 weeks to evaluate this ulcer.  Her shoe wear was modified.  Follow-Up Instructions: Return in about 3 weeks (around 12/07/2020).   Ortho Exam  Patient is alert, oriented, no adenopathy, well-dressed, normal affect, normal respiratory effort. Examination patient has a small ulcer on the tip of the clawed third toe this was pared with a 10 blade knife without problems.  No ulcers no cellulitis.  Examination of the heel she has a Wagner grade 1 ulcer.  After informed consent a 10 blade knife was used debride the skin and soft tissue back to healthy viable granulation tissue there is no abscess no ulcers.  After debridement the ulcer is 2 cm diameter 2 mm deep with healthy granulation tissue a Band-Aid was applied.  Imaging: No results found. No images are attached to the encounter.  Labs: Lab Results  Component Value Date   HGBA1C 6.6 (H) 11/03/2020   HGBA1C 6.6 (H) 05/05/2020   HGBA1C 5.4 04/04/2020   ESRSEDRATE 9 09/29/2019   ESRSEDRATE 0  05/03/2019   ESRSEDRATE 1 05/02/2014   CRP 14.7 (H) 09/29/2019   LABURIC 8.9 (H) 11/03/2020   LABURIC 8.7 (H) 05/05/2020   LABURIC 14.8 (H) 11/01/2019   REPTSTATUS 09/07/2019 FINAL 09/04/2019   CULT >=100,000 COLONIES/mL ESCHERICHIA COLI (A) 09/04/2019   LABORGA ESCHERICHIA COLI (A) 09/04/2019     Lab Results  Component Value Date   ALBUMIN 4.1 10/20/2020   ALBUMIN 4.0 06/21/2020   ALBUMIN 4.2 04/24/2020    Lab Results  Component Value Date   MG 2.2 11/03/2020   MG 2.4 05/05/2020   MG 2.2 04/05/2020   Lab Results  Component Value Date   VD25OH 45 11/03/2020   VD25OH 49 05/05/2020   VD25OH 56 11/01/2019    No results found for: PREALBUMIN CBC EXTENDED Latest Ref Rng & Units 11/03/2020 10/20/2020 06/21/2020  WBC 3.8 - 10.8 Thousand/uL 5.9 5.9 6.8  RBC 3.80 - 5.10 Million/uL 4.53 4.31 4.60  HGB 11.7 - 15.5 g/dL 11.5(L) 11.2(L) 10.9(L)  HCT 35.0 - 45.0 % 39.2 36.7 36.9  PLT 140 - 400 Thousand/uL 320 321 387  NEUTROABS 1,500 - 7,800 cells/uL 4,083 3.8 4.6  LYMPHSABS 850 - 3,900 cells/uL 808(L) 0.7 0.7     There is no height or weight on file to calculate BMI.  Orders:  Orders Placed This Encounter  Procedures   XR KNEE 3 VIEW LEFT   No orders of the defined types were placed in this encounter.    Procedures: No procedures performed  Clinical Data: No additional findings.  ROS:  All other systems negative, except as noted in the HPI. Review of Systems  Objective: Vital Signs: There were no vitals taken for this visit.  Specialty Comments:  No specialty comments available.  PMFS History: Patient Active Problem List   Diagnosis Date Noted   Benign neoplasm of cecum    Benign neoplasm of rectum    Blood in stool    Melena    Acute blood loss anemia    GI bleed 04/04/2020   Symptomatic anemia 11/02/2019   DM neuropathy, type II diabetes mellitus (HCC)    Stage 4 chronic kidney disease (HCC)    Obesity (BMI 30-39.9)    High cholesterol    Aortic  valve thickening by Cardiac echo in 2015 11/01/2019   Iron deficiency anemia 05/10/2019   Secondary hyperparathyroidism of renal origin ([place]) 09/20/2018   Type 2 diabetes mellitus with stage 4 chronic kidney disease, with long-term current use of insulin ([place]) 12/08/2017   ACE inhibitor intolerance 12/08/2017   Vertigo of central origin 11/20/2015   Morbid obesity ([place]) BMI 35+ with T2DM, htn, hyperlipidemia, CKD, OSA 12/12/2014   OSA on CPAP 08/08/2014   Type II diabetes mellitus with neurological manifestations ([place]) 09/15/2013   Hyperlipidemia associated with type 2 diabetes mellitus ([place]) 06/27/2013   Vitamin D deficiency 06/27/2013   Seizure disorder ([place]) 11/04/2012   Epilepsy ([place]) 11/04/2012   Anemia 06/13/2008   COLONIC POLYPS 06/09/2008   Hypothyroidism 06/09/2008   CKD stage 4 due to type 2 diabetes mellitus ([place]) 06/09/2008   Venous (peripheral) insufficiency 06/09/2008   Gastroesophageal reflux disease 06/09/2008   Essential hypertension 06/09/2008   Past Medical History:  Diagnosis Date   Anemia, unspecified    Arthritis    "knees; right shoulder" (09/15/2013)   Basal cell carcinoma    "right upper outer lip"   DM neuropathy, type II diabetes mellitus ([place])    Dyspnea 2021   with low iron   GERD (gastroesophageal reflux disease)    Gout    High cholesterol    Hypertension    Hypothyroidism    Meniere's disease    Obesity (BMI 30-39.9)    Other forms of epilepsy and recurrent seizures without mention of intractable epilepsy 11/04/2012   Non convulsive paroxysmal spells, responding to [facility], patient not driving.    Pneumonia ~ 1943   Skin cancer    "forehead; right hand"   Stage 4 chronic kidney disease ([place])    UTI (urinary tract infection)    Vitamin D deficiency     Family History  Problem Relation Age of Onset   Heart disease Mother    Stroke Mother    Heart disease Father    Ovarian cancer Sister    Hypertension Son    Hypertension Son     Diabetes Son    Alcohol abuse Son     Past Surgical History:  Procedure Laterality Date   BIOPSY  04/05/2020   Procedure: BIOPSY;  Surgeon: Jerene Bears, MD;  Location: [facility];  Service: Gastroenterology;;   CERVICAL POLYPECTOMY     COLONOSCOPY WITH PROPOFOL N/A 04/06/2020   Procedure: COLONOSCOPY WITH PROPOFOL;  Surgeon: Jerene Bears, MD;  Location: [facility];  Service: Gastroenterology;  Laterality: N/A;  ESOPHAGOGASTRODUODENOSCOPY (EGD) WITH PROPOFOL N/A 04/05/2020   Procedure: ESOPHAGOGASTRODUODENOSCOPY (EGD) WITH PROPOFOL;  Surgeon: Jerene Bears, MD;  Location: [facility];  Service: Gastroenterology;  Laterality: N/A;   HAMMER TOE SURGERY Left 1980's   MOHS SURGERY  [address]87   "right upper outer lip"   POLYPECTOMY  04/06/2020   Procedure: POLYPECTOMY;  Surgeon: Jerene Bears, MD;  Location: [facility];  Service: Gastroenterology;;   TONSILLECTOMY  ~ [place] Left ~ 1950   "ran arm thru window"   Social History   Occupational History   Occupation: retired  Tobacco Use   Smoking status: Former    Packs/day: 1.00    Years: 30.00    Pack years: 30.00    Types: Cigarettes    Quit date: 06/29/1983    Years since quitting: 37.4   Smokeless tobacco: Never  Vaping Use   Vaping Use: Never used  Substance and Sexual Activity   Alcohol use: Yes    Alcohol/week: 0.0 standard drinks    Comment: 09/15/2013 "glass of wine maybe once/year"   Drug use: No   Sexual activity: Not Currently

## 2020-11-29 ENCOUNTER — Other Ambulatory Visit: Payer: Self-pay | Admitting: Internal Medicine

## 2020-11-29 DIAGNOSIS — R42 Dizziness and giddiness: Secondary | ICD-10-CM

## 2020-11-29 DIAGNOSIS — E039 Hypothyroidism, unspecified: Secondary | ICD-10-CM

## 2020-12-04 ENCOUNTER — Encounter: Payer: Self-pay | Admitting: Orthopedic Surgery

## 2020-12-04 ENCOUNTER — Ambulatory Visit: Payer: Medicare Other | Admitting: Orthopedic Surgery

## 2020-12-04 DIAGNOSIS — L97411 Non-pressure chronic ulcer of right heel and midfoot limited to breakdown of skin: Secondary | ICD-10-CM

## 2020-12-04 NOTE — Progress Notes (Signed)
Office Visit Note   Patient: [name] [name]           Date of Birth: 1932/10/31           MRN: [id number] Visit Date: 12/04/2020              Requested by: Unk Pinto, [address] [place] [place],  [place] [id number] PCP: Unk Pinto, MD  Chief Complaint  Patient presents with   Right Foot - Follow-up    Heel ulcer       HPI: Patient is a 87 year old woman with a decubitus right heel ulcer.  She is currently weightbearing in new balance sneakers.  Assessment & Plan: Visit Diagnoses:  1. Non-pressure chronic ulcer of right heel and midfoot limited to breakdown of skin ([place])     Plan: A felt relieving donut was placed under her orthotic nails were trimmed x9.  Follow-Up Instructions: Return in about 4 weeks (around 01/01/2021).   Ortho Exam  Patient is alert, oriented, no adenopathy, well-dressed, normal affect, normal respiratory effort. Examination patient has a small ulcer beneath the right heel which is 5 mm in diameter 1 mm deep there is no cellulitis no odor no drainage no signs of infection she does have onychomycotic nails and these were trimmed x9.  Imaging: No results found. No images are attached to the encounter.  Labs: Lab Results  Component Value Date   HGBA1C 6.6 (H) 11/03/2020   HGBA1C 6.6 (H) 05/05/2020   HGBA1C 5.4 04/04/2020   ESRSEDRATE 9 09/29/2019   ESRSEDRATE 0 05/03/2019   ESRSEDRATE 1 05/02/2014   CRP 14.7 (H) 09/29/2019   LABURIC 8.9 (H) 11/03/2020   LABURIC 8.7 (H) 05/05/2020   LABURIC 14.8 (H) 11/01/2019   REPTSTATUS 09/07/2019 FINAL 09/04/2019   CULT >=100,000 COLONIES/mL ESCHERICHIA COLI (A) 09/04/2019   LABORGA ESCHERICHIA COLI (A) 09/04/2019     Lab Results  Component Value Date   ALBUMIN 4.1 10/20/2020   ALBUMIN 4.0 06/21/2020   ALBUMIN 4.2 04/24/2020    Lab Results  Component Value Date   MG 2.2 11/03/2020   MG 2.4 05/05/2020   MG 2.2 04/05/2020   Lab Results  Component Value Date    VD25OH 45 11/03/2020   VD25OH 49 05/05/2020   VD25OH 56 11/01/2019    No results found for: PREALBUMIN CBC EXTENDED Latest Ref Rng & Units 11/03/2020 10/20/2020 06/21/2020  WBC 3.8 - 10.8 Thousand/uL 5.9 5.9 6.8  RBC 3.80 - 5.10 Million/uL 4.53 4.31 4.60  HGB 11.7 - 15.5 g/dL 11.5(L) 11.2(L) 10.9(L)  HCT 35.0 - 45.0 % 39.2 36.7 36.9  PLT 140 - 400 Thousand/uL 320 321 387  NEUTROABS 1,500 - 7,800 cells/uL 4,083 3.8 4.6  LYMPHSABS 850 - 3,900 cells/uL 808(L) 0.7 0.7     There is no height or weight on file to calculate BMI.  Orders:  No orders of the defined types were placed in this encounter.  No orders of the defined types were placed in this encounter.    Procedures: No procedures performed  Clinical Data: No additional findings.  ROS:  All other systems negative, except as noted in the HPI. Review of Systems  Objective: Vital Signs: There were no vitals taken for this visit.  Specialty Comments:  No specialty comments available.  PMFS History: Patient Active Problem List   Diagnosis Date Noted   Benign neoplasm of cecum    Benign neoplasm of rectum    Blood in stool    Melena  Acute blood loss anemia    GI bleed 04/04/2020   Symptomatic anemia 11/02/2019   DM neuropathy, type II diabetes mellitus (HCC)    Stage 4 chronic kidney disease (HCC)    Obesity (BMI 30-39.9)    High cholesterol    Aortic valve thickening by Cardiac echo in 2015 11/01/2019   Iron deficiency anemia 05/10/2019   Secondary hyperparathyroidism of renal origin ([place]) 09/20/2018   Type 2 diabetes mellitus with stage 4 chronic kidney disease, with long-term current use of insulin ([place]) 12/08/2017   ACE inhibitor intolerance 12/08/2017   Vertigo of central origin 11/20/2015   Morbid obesity ([place]) BMI 35+ with T2DM, htn, hyperlipidemia, CKD, OSA 12/12/2014   OSA on CPAP 08/08/2014   Type II diabetes mellitus with neurological manifestations ([place]) 09/15/2013   Hyperlipidemia associated  with type 2 diabetes mellitus ([employer]) 06/27/2013   Vitamin D deficiency 06/27/2013   Seizure disorder ([place]) 11/04/2012   Epilepsy ([place]) 11/04/2012   Anemia 06/13/2008   COLONIC POLYPS 06/09/2008   Hypothyroidism 06/09/2008   CKD stage 4 due to type 2 diabetes mellitus ([place]) 06/09/2008   Venous (peripheral) insufficiency 06/09/2008   Gastroesophageal reflux disease 06/09/2008   Essential hypertension 06/09/2008   Past Medical History:  Diagnosis Date   Anemia, unspecified    Arthritis    "knees; right shoulder" (09/15/2013)   Basal cell carcinoma    "right upper outer lip"   DM neuropathy, type II diabetes mellitus ([place])    Dyspnea 2021   with low iron   GERD (gastroesophageal reflux disease)    Gout    High cholesterol    Hypertension    Hypothyroidism    Meniere's disease    Obesity (BMI 30-39.9)    Other forms of epilepsy and recurrent seizures without mention of intractable epilepsy 11/04/2012   Non convulsive paroxysmal spells, responding to [facility], patient not driving.    Pneumonia ~ 1943   Skin cancer    "forehead; right hand"   Stage 4 chronic kidney disease ([place])    UTI (urinary tract infection)    Vitamin D deficiency     Family History  Problem Relation Age of Onset   Heart disease Mother    Stroke Mother    Heart disease Father    Ovarian cancer Sister    Hypertension Son    Hypertension Son    Diabetes Son    Alcohol abuse Son     Past Surgical History:  Procedure Laterality Date   BIOPSY  04/05/2020   Procedure: BIOPSY;  Surgeon: Jerene Bears, MD;  Location: [facility];  Service: Gastroenterology;;   CERVICAL POLYPECTOMY     COLONOSCOPY WITH PROPOFOL N/A 04/06/2020   Procedure: COLONOSCOPY WITH PROPOFOL;  Surgeon: Jerene Bears, MD;  Location: [facility];  Service: Gastroenterology;  Laterality: N/A;   ESOPHAGOGASTRODUODENOSCOPY (EGD) WITH PROPOFOL N/A 04/05/2020   Procedure: ESOPHAGOGASTRODUODENOSCOPY (EGD) WITH PROPOFOL;  Surgeon: Jerene Bears,  MD;  [facility]: [facility];  Service: Gastroenterology;  Laterality: N/A;   HAMMER TOE SURGERY Left 1980's   MOHS SURGERY  20087   "right upper outer lip"   POLYPECTOMY  04/06/2020   Procedure: POLYPECTOMY;  Surgeon: Jerene Bears, MD;  Location: [facility];  Service: Gastroenterology;;   TONSILLECTOMY  ~ [place] Left ~ 1950   "ran arm thru window"   Social History   Occupational History   Occupation: retired  Tobacco Use   Smoking status: Former    Packs/day: 1.00  Years: 30.00    Pack years: 30.00    Types: Cigarettes    Quit date: 06/29/1983    Years since quitting: 37.4   Smokeless tobacco: Never  Vaping Use   Vaping Use: Never used  Substance and Sexual Activity   Alcohol use: Yes    Alcohol/week: 0.0 standard drinks    Comment: 09/15/2013 "glass of wine maybe once/year"   Drug use: No   Sexual activity: Not Currently

## 2020-12-11 ENCOUNTER — Other Ambulatory Visit: Payer: Self-pay | Admitting: Obstetrics and Gynecology

## 2020-12-11 ENCOUNTER — Other Ambulatory Visit: Payer: Self-pay

## 2020-12-11 ENCOUNTER — Ambulatory Visit
Admission: RE | Admit: 2020-12-11 | Discharge: 2020-12-11 | Disposition: A | Discharge status: Home / Self Care | Payer: Medicare Other | Source: Ambulatory Visit

## 2020-12-11 DIAGNOSIS — Z1231 Encounter for screening mammogram for malignant neoplasm of breast: Secondary | ICD-10-CM

## 2020-12-19 ENCOUNTER — Other Ambulatory Visit: Payer: Self-pay | Admitting: Internal Medicine

## 2020-12-19 DIAGNOSIS — K219 Gastro-esophageal reflux disease without esophagitis: Secondary | ICD-10-CM

## 2020-12-19 DIAGNOSIS — R42 Dizziness and giddiness: Secondary | ICD-10-CM

## 2021-01-01 ENCOUNTER — Ambulatory Visit: Payer: Medicare Other | Admitting: Orthopedic Surgery

## 2021-01-01 ENCOUNTER — Encounter: Payer: Self-pay | Admitting: Orthopedic Surgery

## 2021-01-01 DIAGNOSIS — M6701 Short Achilles tendon (acquired), right ankle: Secondary | ICD-10-CM | POA: Diagnosis not present

## 2021-01-01 DIAGNOSIS — L97411 Non-pressure chronic ulcer of right heel and midfoot limited to breakdown of skin: Secondary | ICD-10-CM

## 2021-01-01 NOTE — Progress Notes (Signed)
Office Visit Note   Patient: [name] [name]           Date of Birth: 09/28/1932           MRN: [id number] Visit Date: 01/01/2021              Requested by: Unk Pinto, [address],  [place] [id number] PCP: Unk Pinto, MD  Chief Complaint  Patient presents with   Right Foot - Wound Check, Follow-up      HPI: Patient presents for a chronic decubitus right heel ulcer.  Patient also complains of pain over the lateral aspect of her right foot she does not have pain with wearing her new balance sneakers but does have pain when wearing her dress shoes.  Assessment & Plan: Visit Diagnoses:  1. Non-pressure chronic ulcer of right heel and midfoot limited to breakdown of skin ([place])   2. Achilles tendon contracture, right     Plan: Recommended Achilles stretching and this was demonstrated to help unload the pressure from the lateral aspect of her foot.  The ulcer was debrided of skin and soft tissue.  Follow-Up Instructions: Return in about 4 weeks (around 01/29/2021).   Ortho Exam  Patient is alert, oriented, no adenopathy, well-dressed, normal affect, normal respiratory effort. Examination patient has a ulcer on the plantar aspect of the right heel.  After informed consent a 10 blade knife was used to debride the skin and soft tissue back to healthy viable granulation tissue.  There was no exposed bone or tendon.  Predebridement the ulcer was 5 mm in diameter.  After debridement the ulcer is 15 mm in diameter and 1 mm deep there is no cellulitis no drainage no odor no signs of infection.  With the knee extended patient has dorsiflexion of her foot approximately 10 degrees short of neutral with Achilles contracture.  She does have callus over the lateral column of her foot.  Imaging: No results found. No images are attached to the encounter.  Labs: Lab Results  Component Value Date   HGBA1C 6.6 (H) 11/03/2020   HGBA1C 6.6 (H) 05/05/2020    HGBA1C 5.4 04/04/2020   ESRSEDRATE 9 09/29/2019   ESRSEDRATE 0 05/03/2019   ESRSEDRATE 1 05/02/2014   CRP 14.7 (H) 09/29/2019   LABURIC 8.9 (H) 11/03/2020   LABURIC 8.7 (H) 05/05/2020   LABURIC 14.8 (H) 11/01/2019   REPTSTATUS 09/07/2019 FINAL 09/04/2019   CULT >=100,000 COLONIES/mL ESCHERICHIA COLI (A) 09/04/2019   LABORGA ESCHERICHIA COLI (A) 09/04/2019     Lab Results  Component Value Date   ALBUMIN 4.1 10/20/2020   ALBUMIN 4.0 06/21/2020   ALBUMIN 4.2 04/24/2020    Lab Results  Component Value Date   MG 2.2 11/03/2020   MG 2.4 05/05/2020   MG 2.2 04/05/2020   Lab Results  Component Value Date   VD25OH 45 11/03/2020   VD25OH 49 05/05/2020   VD25OH 56 11/01/2019    No results found for: PREALBUMIN CBC EXTENDED Latest Ref Rng & Units 11/03/2020 10/20/2020 06/21/2020  WBC 3.8 - 10.8 Thousand/uL 5.9 5.9 6.8  RBC 3.80 - 5.10 Million/uL 4.53 4.31 4.60  HGB 11.7 - 15.5 g/dL 11.5(L) 11.2(L) 10.9(L)  HCT 35.0 - 45.0 % 39.2 36.7 36.9  PLT 140 - 400 Thousand/uL 320 321 387  NEUTROABS 1,500 - 7,800 cells/uL 4,083 3.8 4.6  LYMPHSABS 850 - 3,900 cells/uL 808(L) 0.7 0.7     There is no height or weight on file to  calculate BMI.  Orders:  No orders of the defined types were placed in this encounter.  No orders of the defined types were placed in this encounter.    Procedures: No procedures performed  Clinical Data: No additional findings.  ROS:  All other systems negative, except as noted in the HPI. Review of Systems  Objective: Vital Signs: There were no vitals taken for this visit.  Specialty Comments:  No specialty comments available.  PMFS History: Patient Active Problem List   Diagnosis Date Noted   Benign neoplasm of cecum    Benign neoplasm of rectum    Blood in stool    Melena    Acute blood loss anemia    GI bleed 04/04/2020   Symptomatic anemia 11/02/2019   DM neuropathy, type II diabetes mellitus (HCC)    Stage 4 chronic kidney disease (HCC)     Obesity (BMI 30-39.9)    High cholesterol    Aortic valve thickening by Cardiac echo in 2015 11/01/2019   Iron deficiency anemia 05/10/2019   Secondary hyperparathyroidism of renal origin ([place]) 09/20/2018   Type 2 diabetes mellitus with stage 4 chronic kidney disease, with long-term current use of insulin ([place]) 12/08/2017   ACE inhibitor intolerance 12/08/2017   Vertigo of central origin 11/20/2015   Morbid obesity ([place]) BMI 35+ with T2DM, htn, hyperlipidemia, CKD, OSA 12/12/2014   OSA on CPAP 08/08/2014   Type II diabetes mellitus with neurological manifestations ([place]) 09/15/2013   Hyperlipidemia associated with type 2 diabetes mellitus ([place]) 06/27/2013   Vitamin D deficiency 06/27/2013   Seizure disorder ([place]) 11/04/2012   Epilepsy ([place]) 11/04/2012   Anemia 06/13/2008   COLONIC POLYPS 06/09/2008   Hypothyroidism 06/09/2008   CKD stage 4 due to type 2 diabetes mellitus ([place]) 06/09/2008   Venous (peripheral) insufficiency 06/09/2008   Gastroesophageal reflux disease 06/09/2008   Essential hypertension 06/09/2008   Past Medical History:  Diagnosis Date   Anemia, unspecified    Arthritis    "knees; right shoulder" (09/15/2013)   Basal cell carcinoma    "right upper outer lip"   DM neuropathy, type II diabetes mellitus ([place])    Dyspnea 2021   with low iron   GERD (gastroesophageal reflux disease)    Gout    High cholesterol    Hypertension    Hypothyroidism    Meniere's disease    Obesity (BMI 30-39.9)    Other forms of epilepsy and recurrent seizures without mention of intractable epilepsy 11/04/2012   Non convulsive paroxysmal spells, responding to [facility], patient not driving.    Pneumonia ~ 1943   Skin cancer    "forehead; right hand"   Stage 4 chronic kidney disease ([place])    UTI (urinary tract infection)    Vitamin D deficiency     Family History  Problem Relation Age of Onset   Heart disease Mother    Stroke Mother    Heart disease Father    Ovarian cancer  Sister    Hypertension Son    Hypertension Son    Diabetes Son    Alcohol abuse Son     Past Surgical History:  Procedure Laterality Date   BIOPSY  04/05/2020   Procedure: BIOPSY;  Surgeon: Jerene Bears, MD;  [facility];  Service: Gastroenterology;;   CERVICAL POLYPECTOMY     COLONOSCOPY WITH PROPOFOL N/A 04/06/2020   Procedure: COLONOSCOPY WITH PROPOFOL;  Surgeon: Jerene Bears, MD;  [facility];  Service: Gastroenterology;  Laterality: N/A;  ESOPHAGOGASTRODUODENOSCOPY (EGD) WITH PROPOFOL N/A 04/05/2020   Procedure: ESOPHAGOGASTRODUODENOSCOPY (EGD) WITH PROPOFOL;  Surgeon: Jerene Bears, MD;  [facility];  Service: Gastroenterology;  Laterality: N/A;   HAMMER TOE SURGERY Left 1980's   MOHS SURGERY  [address]87   "right upper outer lip"   POLYPECTOMY  04/06/2020   Procedure: POLYPECTOMY;  Surgeon: Jerene Bears, MD;  [facility];  Service: Gastroenterology;;   TONSILLECTOMY  ~ [place] Left ~ 1950   "ran arm thru window"   Social History   Occupational History   Occupation: retired  Tobacco Use   Smoking status: Former    Packs/day: 1.00    Years: 30.00    Pack years: 30.00    Types: Cigarettes    Quit date: 06/29/1983    Years since quitting: 37.5   Smokeless tobacco: Never  Vaping Use   Vaping Use: Never used  Substance and Sexual Activity   Alcohol use: Yes    Alcohol/week: 0.0 standard drinks    Comment: 09/15/2013 "glass of wine maybe once/year"   Drug use: No   Sexual activity: Not Currently

## 2021-01-09 ENCOUNTER — Telehealth: Payer: Medicare Other | Admitting: Pharmacist

## 2021-01-10 ENCOUNTER — Other Ambulatory Visit: Payer: Self-pay

## 2021-01-10 ENCOUNTER — Ambulatory Visit: Payer: Medicare Other | Admitting: Pharmacist

## 2021-01-10 DIAGNOSIS — E669 Obesity, unspecified: Secondary | ICD-10-CM

## 2021-01-10 DIAGNOSIS — E1122 Type 2 diabetes mellitus with diabetic chronic kidney disease: Secondary | ICD-10-CM

## 2021-01-10 DIAGNOSIS — E1149 Type 2 diabetes mellitus with other diabetic neurological complication: Secondary | ICD-10-CM

## 2021-01-10 DIAGNOSIS — N184 Chronic kidney disease, stage 4 (severe): Secondary | ICD-10-CM

## 2021-01-10 DIAGNOSIS — K219 Gastro-esophageal reflux disease without esophagitis: Secondary | ICD-10-CM

## 2021-01-10 DIAGNOSIS — E039 Hypothyroidism, unspecified: Secondary | ICD-10-CM

## 2021-01-10 DIAGNOSIS — E785 Hyperlipidemia, unspecified: Secondary | ICD-10-CM

## 2021-01-10 DIAGNOSIS — E7800 Pure hypercholesterolemia, unspecified: Secondary | ICD-10-CM

## 2021-01-10 DIAGNOSIS — Z794 Long term (current) use of insulin: Secondary | ICD-10-CM

## 2021-01-10 DIAGNOSIS — E1169 Type 2 diabetes mellitus with other specified complication: Secondary | ICD-10-CM

## 2021-01-10 NOTE — Progress Notes (Signed)
Patient Visit with Chart Prep  [name] [name],[name] [name]  [id number] 87 years, Female  DOB: 1932/10/28  M: [phone] Care Team: Imogene Burn, Trader   __________________________________________________ Patient scheduled for CCM visit with the clinical pharmacist.  Patient is referred for CCM by their PCP and CPP is under general PCP supervision.: At least 2 of these conditions are expected to last 12 months or longer and patient is at significant risk for acute exacerbations and/or functional decline.  Patient has consented to participation in [place] program. Visit Type: Phone Call Date of Upcoming Visit: 01/10/2021 . Patient Chart Prep (HC) . Chronic Conditions Patient's Chronic Conditions: Hypertension (HTN), Gastroesophageal Reflux Disease (GERD), Cardiovascular Disease (CVD), Other, Hypothyroidism, Chronic Kidney Disease (CKD), Hyperlipidemia/Dyslipidemia (HLD), Diabetes (DM), Anemia List Other Conditions (separated by comma): OSA on CPAP, Seizure disorder, Epilepsy, Vitamin D deficiency,  . Doctor and Hospital Visits Were there PCP Visits in last 6 months?: Yes Visit #1: 11/03/2020- Dr. Melford Aase- Patient was seen for an adult medical exam. No medication changes.  Were there Specialist Visits in last 6 months?: Yes Visit #1: 01/01/2021, 12/04/2020, 11/16/2020- Dr. Sharol Given (orthopedic surgery)- Patient was seen for right foot-wound check follow up. No medication changes.   Visit #2: 10/20/2020- Dr. Vivien Rossetti (oncology)- Patient was seen for MPN. No medication changes.  Was there a Hospital Visit in last 30 days?: No Were there other Hospital Visits in last 6 months?: No . Medication Information Are there any Medication discrepancies?: No Are there any Medication adherence gaps (beyond 5 days past due)?: No Medication adherence rates for the STAR rating drugs: None List Patient's current Care Gaps: No current Care Gaps identified . Pre-Call Questions [facility]) Are you able to connect with  Patient: Yes Visit Type: Phone Call May we confirm what is the best phone # for the pharmacist to call you?: [phone] Confirm visit date/time: 11/23 @ 9am Have you seen any other providers since your last visit?: No Any changes in your medicines or health?: No Any side effects from any medications?: No Do you have any symptoms or problems not managed by your medications?: No What are your top 2 health concerns right now?: diabetes vertigo What are your top 2 medication concerns right now?: Other Details: None - gets prescriptions mailed Has your Dr asked that you take BP, BG or special diet at home?: Monitor BG Do you get any type of exercise on a regular basis?: No What are your top 1 or 2 goals for your health?: regulating diabetes What are you doing to try to reach these goals?: Monitoring BG, Taking medications What, if any, problems do you have getting your medications from the pharmacy?: None Is there anything else that you would like to discuss during the appointment?: No . Disease Assessments . 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 Lives by herself but has a housekeeper who comes every 2 weeks. Lives in [facility]. She does a lot of volunteer work within [employer] center. If there is a funeral at [employer], she plays the piano. She also plays piano on Sunday morning sometimes. Plants flowers in the yard as well. House work Who does the patient spend  their time with and what do they do?: Pt lives alone. Has a few friends at the community center. Has 1 meal a day (dinner) at the dinning room  and interacts with them. Has 1 son who lives in [place], another son in [place], and another son who lives a mile away. Pt does not drive. This son takes her to doctors appointments and is there whenever she needs him. Lifestyle habits such as diet and exercise?: Diet: Tries not to eat too much sweets.  Breakfast: special k with half banana and 1 cup of coffee Lunch: peanut butter crackers Dinner: meat and veggies at [employer] center  Exercise: Practice on the piano, patient sorts out music, played piano for 80 years, fingers are getting numb after 15-20 minutes after playing Alcohol, tobacco, and illicit drug usage?: Alcohol: 1 glass wine occasionally  Tobacco: Former smoker x 40 years ago  Illicit drug use: 0 What is the patient's sleep pattern?: No sleep issues, Trouble staying asleep, Other (with free form text) Details: wakes up 4 times to use the bathroom How many hours per night does patient typically sleep?: 10:30 pm - 8am. approximately 10 hours Patient pleased with health care they are receiving?: Yes Family, occupational, and living circumstances relevant to overall health?: Mother (Deceased): Heart disease, Stroke   Father (Deceased at age 28): Heart disease   Sister (Alive)  Ovarian cancer   Youngest son has brain cancer. Had a tumor removed but 2 other tumors appeared. He delivers yachts across the country. He has a positive attitude. Factors that may affect medication adherence?: Pill burden Name and location of Current pharmacy: Mail order Current Rx insurance plan: Other Details: BCBS Are meds synced by current pharmacy?: No Are meds delivered by current pharmacy?: Yes - by mail order pharmacy Would patient benefit from direct intervention of clinical lead in dispensing process to optimize clinical outcomes?: Yes Are [facility] pharmacy services available where patient lives?: Yes Is patient disadvantaged to use [facility]?: Yes Does patient experience  delays in picking up medications due to transportation concerns (getting to pharmacy)?: No Any additional demeanor/mood notes?: Lillien Petronio is a friendly 87 year old female who I completed an initial CCM visit over the phone. She presents with no chief complaints. . Hypertension (HTN) Assess this condition today?: Yes Is patient able to obtain BP reading today?: No Goal: <140/90 mmHG Hypertension Stage: Normal (SBP <120 and DBP < 80) Is Patient checking BP at home?: Yes Pt. home BP readings are ranging: 120/60s How often does patient miss taking their blood pressure medications?: Never Has patient experienced hypotension, dizziness, falls or bradycardia?: Yes Provide Details: Pt has been feeling dizzy lately. a month or so ago, she was unable to get up from her bed due to dizziness. Her nurse checked her vital signs and unremarkable Check present secondary causes (below) for HTN: Obesity, CKD BP RPM device: Does patient qualify?: Yes We discussed: DASH diet:  following a diet emphasizing fruits and vegetables and low-fat dairy products along with whole grains, fish, poultry, and nuts. Reducing red meats and sugars., Reducing the amount of salt intake to 1500mg /per day. Assessment:: Controlled Drug: Atenolol 50mg  1 tablet QD Assessment: Appropriate, Effective, Safe, Accessible Drug: Furosemide 40mg  QD (mainly used for Edema) Additional Info: Pt had been feeling dizzy in the past few days has been needing to take meclizine 3 times a day. Plan to Counsel: Wait at least 15 seconds when switching positions, moving from laying down to sitting, sitting to  standing. Utilize walker to avoid falls HC Follow up: N/A Pharmacist Follow up: Follow up with pt BP, HR . Hyperlipidemia/Dyslipidemia (HLD) Last Lipid panel on: 11/02/2020 TC (Goal<200): 108 LDL: 60 HDL (Goal>40): 27 TG (Goal<150): 125 ASCVD 10-year risk?is:: N/A due to Age > 79 Assess this condition today?: Yes LDL Goal: <70 Has  patient tried and failed any HLD Medications?: Yes Medications failed: rosuvastatin, fenofibrate Check present secondary causes (below) that can lead to increased cholesterol levels (multi-choice optional): Beta blockers, Diabetes, CKD We discussed: Encouraged increasing fiber to a daily intake of 10-25g/day, How a diet high in plant sterols (fruits/vegetables/nuts/whole grains/legumes) may reduce your cholesterol., How to reduce cholesterol through diet/weight management and physical activity. Assessment:: Controlled Drug: None Assessment: Query need HC Follow up: N/A Pharmacist Follow up: Assess lipid panel . Diabetes (DM) Current A1C: 6.6 taken on: 11/02/2020 Previous A1C: 6.6 taken on: 05/04/2020 Type: 2 The current microalbumin ratio is: 140 tested on: 11/02/2020 Assess this condition today?: Yes Goal A1C: < 7.0 % Type: 2 Is Patient taking statin medication?: No Reason patient is not taking statin(s): Age Is patient taking ACEi / ARB?: No DM RPM device: Does patient qualify?: Yes Patient checking Blood Sugar at home?: Yes Does patient use a Continuous Glucose Monitoring (CGM) device?: No How often does patient check Blood Sugars?: 2 times a day (morning and evening) Recent fasting Blood sugar reading: 126, 102, 108,  Recent bedtime Blood sugar reading: 172, 165, 170 Has patient experienced any hypoglycemic episodes?: No Diet recall discussed: No We discussed: How to recognize and treat signs of hypoglycemia., Low carbohydrate eating plan with an emphasis on whole grains, legumes, nuts, fruits, and vegetables and minimal refined and processed foods. Assessment:: Controlled Drug: Humulin 70/30 inject 20 units 2 times a day Assessment: Appropriate, Effective, Safe, Accessible HC Follow up: N/A Pharmacist Follow up: Continue monitoring blood sugars, if blood sugars ever lower than 70, consistently reach out to provider or pharmacy team . GERD Assess this condition today?:  Yes How frequently do you have symptoms of GERD or reflux?: occasionally Does patient experience difficulty or pain with swallowing?: No What OTC medications have you tried and what was the response?: N/A Is the patient a smoker or exposed to second-hand smoke?: No How long have you taken prescription medications for GERD?: about 30 years What potential triggering foods have you identified and tried to limit intake in your diet?: none We discussed: Remaining upright for 2-3 hours after eating and avoiding eating for 2-3 hours before bedtime, Identifying and avoiding / limiting trigger foods, Wearing loose fitting clothes Assessment:: Controlled Drug: Lansoprazole 30mg  QD Assessment: Appropriate, Effective, Query Safety HC Follow up: N/A Pharmacist Follow up: Consider pt for taper at next visit . Chronic kidney disease (CKD) Previous GFR: 23 taken on: 11/02/2020 The current microalbumin ratio is: 140 tested on: 11/02/2020 Assess this condition today?: Yes CKD Stage: Stage 4 (GFR 15-29 mL/min) Albuminuria Stage: A2 (30-300) Contributing factors for developing CKD: Diabetes, HTN Is Patient taking statin medication: No Is patient taking ACEi / ARB?: No Renal dose adjustments recommended?: No We discussed: Limiting dietary sodium intake to less than 2000 mg / day, Maintaining blood pressure control, Maintaining blood glucose control, Avoidance of nephrotoxic drugs (NSAIDs) Assessment:: Controlled Drug: none Assessment: Query need HC Follow up: N/A Pharmacist Follow up: Monitor SCr, GFR,  . Hypothyroidism Current TSH: 2.14 taken on: 11/02/2020 Previous TSH: 2.64 taken on: 05/04/2020 Assess this condition today?: Yes Patient has experienced the following symptoms: Constipation We  discussed: Proper administration of levothyroxine (empty stomach, 30 min before breakfast and with no other meds / vitamins), Monitoring for signs / symptoms of hypothyroidism (weight gain, fatigue, hair  loss, cold intolerance) Assessment:: Controlled Drug: Levothyroxine 100 mcg QD Assessment: Appropriate, Effective, Safe, Accessible HC Follow up: N/A Pharmacist Follow up: Continue monitoring TSH levels . General Assess this condition today?: Yes What condition are we assessing today?: Constipation Patient has tried and failed: Dulcolax as needed Assessment:: Uncontrolled Drug: Dulcolax 5mg  QD Assessment: Appropriate, Query Effectiveness Plan to Start: Docusate 50mg  QD Plan to Counsel: Informed patient to increase fiber intake, and to stay hydrated to help with constipation HC Follow up: N/A Pharmacist Follow up: Follow up with patient to see if constipation resolved . Exercise, Diet and Non-Drug Coordination Needs Additional exercise counseling points. We discussed: incorporating flexibility, balance, and strength training exercises, encouraging participation in insurance-covered exercise program through local gym (i.e. [employer]) Additional diet counseling points. We discussed: aiming to consume at least 8 cups of water day, key components of a low-carb eating plan Discussed Non-Drug Care Coordination Needs: Yes Does Patient have Medication financial barriers?: No . Accountable Health Communities Health-Related Social Needs Screening Tool -  SDOH  ([email or web address]) What is your living situation today? (ref #1): I have a steady place to live Think about the place you live. Do you have problems with any of the following? (ref #2): None of the above Within the past 12 months, you worried that your food would run out before you got money to buy more (ref #3): Never true Within the past 12 months, the food you bought just didn't last and you didn't have money to get more (ref #4): Never true In the past 12 months, has lack of reliable transportation kept you from medical appointments, meetings, work or from getting things  needed for daily living? (ref #5): No In the past 12 months, has the electric, gas, oil, or water company threatened to shut off services in your home? (ref #6): No How often does anyone, including family and friends, physically hurt you? (ref #7): Never (1) How often does anyone, including family and friends, insult or talk down to you? (ref #8): Never (1) How often does anyone, including friends and family, threaten you with harm? (ref #9): Never (1) How often does anyone, including family and friends, scream or curse at you? (ref #10): Never (1) . Engagement Notes Newton Pigg on 01/10/2021 10:15 AM HC F/u: None CPP F/u:  02/07/21: OV w/[place] 05/07/21: OV w/ [place] 06/05/20: CCM follow up phone visit  Care Gaps:  Opthamology exam: 12/5 - eye exam scheduled Flu shot - recieved late september COVID vaccine - 2nd booster in october [place] vaxxine: pt will look into getting it at drug store  . Engagement Notes Newton Pigg on 01/10/2021 09:45 AM CPP Chart Review: 26 min  CPP Office Visit: 27 min  CPP Office Visit Documentation: 36 min CPP Coordination of Care:  [facility] Care Plan Completion: 10 min CPP Care Plan Review:  10 min  Patient Name:?[name] [name]  DOB:??1933-01-01  ?  Last Care Plan Update:?  COMPREHENSIVE CARE PLAN AND GOALS?  HYPERTENSION?   MOST RECENT BLOOD PRESSURE:????  Current BP: 120/68  Current HR: 74  taken on: 11/02/2020  Previous BP: 126/44  Previous HR: 70  taken on: 10/19/2020   MY GOAL BLOOD PRESSURE:?<140/90 mmHG   CURRENT MEDICATION AND DOSING:?  Drug: Atenolol 50mg  1 tablet every day  Drug: Furosemide 40mg  every day (mainly  used for swelling)   THE GOALS WE HAVE CHOSEN ARE:???  Maintain an at goal blood pressure?   BARRIERS TO ACHIEVING GOALS:?  Controlled   PLAN TO WORK ON THESE GOALS:?  - Wait at least 15 seconds when switching positions, moving from laying down to sitting, sitting to standing. Utilize walker to avoid falls  -Take medications  regularly??  -Check BP at home?  -Reduce salt intake (< 1500mg / day)?  -Diet: DASH diet (Choose fruits, vegetables, and low-fat dairy products. Increase whole grains, fish, poultry, nuts. Reduce red meats and sugars)?  -Weight: 1 kg = ~1 mmHg reduction?  -Exercise?   ?  CHOLESTEROL?   MOST RECENT LABS:11/02/2020????   TOTAL CHOLESTEROL:?108   TRIGLYCERIDES:?125   HDL:?27   LDL:?60   CURRENT MEDICATION AND DOSING:?  NONE   THE GOALS WE HAVE CHOSEN ARE:???  Total Cholesterol goal under 200, Triglycerides goal under 150, HDL goal above 40, LDL goal under 100?   BARRIERS TO ACHIEVING GOALS:?Controlled   PLAN TO WORK ON THESE GOALS:?  -Take medication regularly?  -Diet: high in plant sterols (fruits/ vegetables/ nuts/ whole grains/ legumes). Increase fiber intake (10-25g/day). Avoid foods high in cholesterol (red meat, egg yolks, dairy, oils/ butter). Choose low-fat options.?  -Exercise?  -Weight Management?   ?  DIABETES?   MOST RECENT A1C:   6.6 on 11/02/2020  6.6 on 05/04/2020   MY GOAL A1C:?< 7.0 %   CURRENT MEDICATION AND DOSING:?  Drug: Humulin 70/30 inject 20 units 2 times a day   THE GOALS WE HAVE CHOSEN ARE:??  Maintain an at goal A1C level?   BARRIERS TO ACHIEVING GOALS:?  Controlled   PLAN TO WORK ON THESE GOALS:?  -Take medications as prescribed?  -Check blood glucose 2 times a day  ?  -Diet: natural carbs and sugars (vegetables, fruits, whole grains, legumes, dairy), avoid alcohol, reduce carbohydrate intake and processed sugars. Maintain a low carbohydrate diet, eating 45 grams of carbohydrates per meal (3 servings of carbohydrates per meal), 15 grams of carbohydrates per snack (1 serving of carbohydrate per snack)?  -Weight Loss: waist circumference goal < 35 inches for females; < 40 inches for males?  -Exercise: 150 minutes of moderate-intensity aerobic activity per week (at least 3 days)?  ?  -Discussed Technique: testing / injection as applicable?  -Foot care:  wash, dry examine feet daily. Moisturize the top and bottom (not in between). Trim nails with nail file (no sharp edges). Wear socks and shoes. Elevate feet when sitting. Annual foot exam by podiatrist.?  ?  Recognize sign and symptoms of hypoglycemia and understand treatment as outlined below?  -Hypoglycemia sign and symptoms: dizziness, anxiety/ irritability, shakiness, headache, diaphoresis, hunger, confusion, nausea, ataxia, tremors, palpitations/ tachycardia, blurred vision?  -Hypoglycemia Treatment: "Rule of 15": ?  (1) 15-20 grams of glucose/ simple carb (4 oz juice, 8oz milk, 4oz regular soda, 1 tablespoon sugar/ honey/ corn syrup, 3-4 glucose tabs/ 1 serving glucose gel)?  (2) Recheck BG after 15 mins. If hypoglycemia continues?  (3) Repeat steps 1&2, when BG normalizes, eat small meal or snack?   ?  ?  ?  GERD?   CURRENT REGIMEN AND DOSING:?  Drug: Lansoprazole 30mg  every day   THE GOALS WE HAVE CHOSEN ARE:? Minimize reflux symptoms.?   BARRIERS TO ACHIEVING GOALS:?  Controlled   PLAN TO WORK ON THESE GOALS:???  -Patient is interested in tapering off of this medication. Consider tapering off during next visit  -Educated on ways to prevent acid  reflux, such as avoid spicy foods, smaller portion sizes, wearing loose clothing, avoiding caffeine, and to raise head of bed.???   ?  ?  ?  ?  HYPOTHYROIDISM?   RECENT TSH/DATE:??????????  2.14 on 11/02/2020  2.64 on 05/04/2020??????????????????????????????????????????????????????    CURRENT MEDICATION AND DOSING:?  Drug: Levothyroxine 100 mcg every day   THE GOALS WE HAVE CHOSEN ARE:? Maintain TSH between 0.45 to 4.5uIU/ml?   BARRIERS TO ACHIEVING GOALS:?Controlled   PLAN TO WORK ON THESE GOALS:???  -Continue Medication?  -Take medication with water at the same time each day for consistent absorption at least 1 hour before breakfast or at bedtime (3 hours after last meal)?  ?  Recognize signs and symptoms of thyroid imbalance:?   -Hyperthyroid: Heat intolerance (increased sweating), weight loss, agitation/ nervousness/ irritability/ anxiety, palpitations/ tachycardia, fatigue/ muscle weakness, diarrhea, insomnia, tremor, thinning hair, exophthalmos?  -Hypothyroid: cold intolerance, dry skin, fatigue, muscle cramps, voice changes, constipation, weight gain, myalgias, weakness, depression, bradycardia, hair loss, memory/ mental impairment?   ??  ?  ?  Chronic Kidney Disease?   Labs:?  Previous GFR: 23  taken on: 11/02/2020  The current microalbumin ratio is: 140  tested on: 11/02/2020  CKD Stage: Stage 4 (GFR 15-29 mL/min)  Albuminuria Stage: A2 (30-300)   PLAN TO WORK ON THESE GOALS:???  -Maintaining normal BP and BG?  -Diet: Decrease salt and fatty foods. Increase fruit and vegetables?   ?  Constipation?   CURRENT REGIMEN AND DOSING:?  Drug: Dulcolax 5mg  every day   THE GOALS WE HAVE CHOSEN ARE:?   PLAN TO WORK ON THESE GOALS:???:   Plan to Start: Docusate 50mg  every day to help with constipation  Informed patient to increase fiber intake, and to stay hydrated to help with constipation   ??  ACTIVE MEDICATION LIST?   Your current medication list has been updated. To view, log in to your patient portal.?  Call if any changes need to be made.?   ?  MEDICATION REVIEW?   MEDICATION REVIEW CONDUCTED:?? Yes?  DATE:???01/16/2021  BEST POSSIBLE MEDICATION HISTORY?  SOURCE:?? Medical Records?   ?  ?  HOW DO I? - WHEN DO I??   GET AHOLD OF MY DOCTOR?  DURING BUSINESS HOURS WHEN THE OFFICE IS OPEN?  PHONE NUMBER: [phone]?    AFTER HOURS [facility] NURSE WHEN THE OFFICE IS CLOSED??  PHONE: [phone]?   TALK TO MY CARE COORDINATOR??  NAME: Newton Pigg  PHONE: [phone]    EMAIL:??  [email or web address]?   CARE COORDINATOR STAFF?  Rosary Lively, [address] [place], [place]?  Contact Phone Number: [phone]?   ?  NOTE SECTION?   Thank you for participating in  the Chronic Care Management (CCM) program with Dr.??  ?  This program takes a proactive approach to your health and my team will serve as a resource for you throughout the year. Please follow up at [phone] if you have any questions or experience changes to your overall health. Your next CCM appointment will be conducted with Newton Pigg, PharmD as follows:?  ?  Date?  ?06/05/2020   Time?  ?9:00 am   ?   Over the Phone?   ?  ?   ?  ?    Rachelle Hora Jeannett Senior, PharmD  Clinical Pharmacist  [email or web address]  [phone]

## 2021-01-17 ENCOUNTER — Telehealth: Payer: Self-pay

## 2021-01-17 DIAGNOSIS — E7800 Pure hypercholesterolemia, unspecified: Secondary | ICD-10-CM | POA: Diagnosis not present

## 2021-01-17 DIAGNOSIS — E1122 Type 2 diabetes mellitus with diabetic chronic kidney disease: Secondary | ICD-10-CM | POA: Diagnosis not present

## 2021-01-17 DIAGNOSIS — N184 Chronic kidney disease, stage 4 (severe): Secondary | ICD-10-CM | POA: Diagnosis not present

## 2021-01-17 NOTE — Progress Notes (Signed)
Care plan printed and mailed to patient  Raeqwon Lux, Health Concierge 

## 2021-01-22 LAB — HM DIABETES EYE EXAM

## 2021-01-23 ENCOUNTER — Encounter: Payer: Self-pay | Admitting: Internal Medicine

## 2021-01-29 ENCOUNTER — Ambulatory Visit: Payer: Medicare Other | Admitting: Orthopedic Surgery

## 2021-01-30 ENCOUNTER — Ambulatory Visit: Payer: Self-pay

## 2021-01-30 ENCOUNTER — Encounter: Payer: Self-pay | Admitting: Orthopedic Surgery

## 2021-01-30 ENCOUNTER — Ambulatory Visit: Payer: Medicare Other | Admitting: Orthopedic Surgery

## 2021-01-30 DIAGNOSIS — L97411 Non-pressure chronic ulcer of right heel and midfoot limited to breakdown of skin: Secondary | ICD-10-CM | POA: Diagnosis not present

## 2021-01-30 NOTE — Progress Notes (Signed)
Office Visit Note   Patient: [name] [name]           Date of Birth: 11/19/1932           MRN: [id number] Visit Date: 01/30/2021              Requested by: Unk Pinto, [address] [place],  [place] [address] PCP: Unk Pinto, MD  Chief Complaint  Patient presents with   Right Foot - Follow-up      HPI: Patient is an 87 year old woman who presents in follow-up for recurrent chronic ulcer plantar aspect of the right heel.  Patient is full weightbearing in regular shoes has been using a Band-Aid states she has had some yellow clear drainage.  Assessment & Plan: Visit Diagnoses:  1. Non-pressure chronic ulcer of right heel and midfoot limited to breakdown of skin ([place])     Plan: The ulcer was debrided of skin and soft tissue this probes about 3 mm deep did not communicate with the bone or fascia.  Patient will use antibiotic ointment and a Band-Aid.  Follow-Up Instructions: Return in about 4 weeks (around 02/27/2021).   Ortho Exam  Patient is alert, oriented, no adenopathy, well-dressed, normal affect, normal respiratory effort. Examination patient has some thickened discolored onychomycotic nails her fourth toenail is most troublesome and this was debrided and there was no signs of infection.  Right heel has a insensate neuropathic ulcer.  After informed consent a 10 blade knife was used debride the skin and soft tissue back to healthy viable tissue this was probed with a Q-tip this did not probe deeper than 3 mm.  After debridement the ulcer is 10 mm in diameter prior to debridement the ulcer was 3 mm in diameter.  There is no drainage there is no tunneling.  Imaging: XR Os Calcis Right  Result Date: 01/30/2021 2 view radiographs of the right calcaneus shows no destructive bony lesion beneath the ulcer.  There is an old chronic calcaneal fracture  No images are attached to the encounter.  Labs: Lab Results  Component Value Date   HGBA1C 6.6  (H) 11/03/2020   HGBA1C 6.6 (H) 05/05/2020   HGBA1C 5.4 04/04/2020   ESRSEDRATE 9 09/29/2019   ESRSEDRATE 0 05/03/2019   ESRSEDRATE 1 05/02/2014   CRP 14.7 (H) 09/29/2019   LABURIC 8.9 (H) 11/03/2020   LABURIC 8.7 (H) 05/05/2020   LABURIC 14.8 (H) 11/01/2019   REPTSTATUS 09/07/2019 FINAL 09/04/2019   CULT >=100,000 COLONIES/mL ESCHERICHIA COLI (A) 09/04/2019   LABORGA ESCHERICHIA COLI (A) 09/04/2019     Lab Results  Component Value Date   ALBUMIN 4.1 10/20/2020   ALBUMIN 4.0 06/21/2020   ALBUMIN 4.2 04/24/2020    Lab Results  Component Value Date   MG 2.2 11/03/2020   MG 2.4 05/05/2020   MG 2.2 04/05/2020   Lab Results  Component Value Date   VD25OH 45 11/03/2020   VD25OH 49 05/05/2020   VD25OH 56 11/01/2019    No results found for: PREALBUMIN CBC EXTENDED Latest Ref Rng & Units 11/03/2020 10/20/2020 06/21/2020  WBC 3.8 - 10.8 Thousand/uL 5.9 5.9 6.8  RBC 3.80 - 5.10 Million/uL 4.53 4.31 4.60  HGB 11.7 - 15.5 g/dL 11.5(L) 11.2(L) 10.9(L)  HCT 35.0 - 45.0 % 39.2 36.7 36.9  PLT 140 - 400 Thousand/uL 320 321 387  NEUTROABS 1,500 - 7,800 cells/uL 4,083 3.8 4.6  LYMPHSABS 850 - 3,900 cells/uL 808(L) 0.7 0.7     There is no  height or weight on file to calculate BMI.  Orders:  Orders Placed This Encounter  Procedures   XR Os Calcis Right   No orders of the defined types were placed in this encounter.    Procedures: No procedures performed  Clinical Data: No additional findings.  ROS:  All other systems negative, except as noted in the HPI. Review of Systems  Objective: Vital Signs: There were no vitals taken for this visit.  Specialty Comments:  No specialty comments available.  PMFS History: Patient Active Problem List   Diagnosis Date Noted   Benign neoplasm of cecum    Benign neoplasm of rectum    Blood in stool    Melena    Acute blood loss anemia    GI bleed 04/04/2020   Symptomatic anemia 11/02/2019   DM neuropathy, type II diabetes  mellitus (HCC)    Stage 4 chronic kidney disease (HCC)    Obesity (BMI 30-39.9)    High cholesterol    Aortic valve thickening by Cardiac echo in 2015 11/01/2019   Iron deficiency anemia 05/10/2019   Secondary hyperparathyroidism of renal origin ([place]) 09/20/2018   Type 2 diabetes mellitus with stage 4 chronic kidney disease, with long-term current use of insulin ([place]) 12/08/2017   ACE inhibitor intolerance 12/08/2017   Vertigo of central origin 11/20/2015   Morbid obesity ([place]) BMI 35+ with T2DM, htn, hyperlipidemia, CKD, OSA 12/12/2014   OSA on CPAP 08/08/2014   Type II diabetes mellitus with neurological manifestations ([place]) 09/15/2013   Hyperlipidemia associated with type 2 diabetes mellitus ([place]) 06/27/2013   Vitamin D deficiency 06/27/2013   Seizure disorder ([place]) 11/04/2012   Epilepsy ([place]) 11/04/2012   Anemia 06/13/2008   COLONIC POLYPS 06/09/2008   Hypothyroidism 06/09/2008   CKD stage 4 due to type 2 diabetes mellitus ([place]) 06/09/2008   Venous (peripheral) insufficiency 06/09/2008   Gastroesophageal reflux disease 06/09/2008   Essential hypertension 06/09/2008   Past Medical History:  Diagnosis Date   Anemia, unspecified    Arthritis    "knees; right shoulder" (09/15/2013)   Basal cell carcinoma    "right upper outer lip"   DM neuropathy, type II diabetes mellitus ([place])    Dyspnea 2021   with low iron   GERD (gastroesophageal reflux disease)    Gout    High cholesterol    Hypertension    Hypothyroidism    Meniere's disease    Obesity (BMI 30-39.9)    Other forms of epilepsy and recurrent seizures without mention of intractable epilepsy 11/04/2012   Non convulsive paroxysmal spells, responding to [facility], patient not driving.    Pneumonia ~ 1943   Skin cancer    "forehead; right hand"   Stage 4 chronic kidney disease ([place])    UTI (urinary tract infection)    Vitamin D deficiency     Family History  Problem Relation Age of Onset   Heart disease Mother     Stroke Mother    Heart disease Father    Ovarian cancer Sister    Hypertension Son    Hypertension Son    Diabetes Son    Alcohol abuse Son     Past Surgical History:  Procedure Laterality Date   BIOPSY  04/05/2020   Procedure: BIOPSY;  Surgeon: Jerene Bears, MD;  Location: [facility];  Service: Gastroenterology;;   CERVICAL POLYPECTOMY     COLONOSCOPY WITH PROPOFOL N/A 04/06/2020   Procedure: COLONOSCOPY WITH PROPOFOL;  Surgeon: Jerene Bears, MD;  Location: [facility];  Service: Gastroenterology;  Laterality: N/A;   ESOPHAGOGASTRODUODENOSCOPY (EGD) WITH PROPOFOL N/A 04/05/2020   Procedure: ESOPHAGOGASTRODUODENOSCOPY (EGD) WITH PROPOFOL;  Surgeon: Jerene Bears, MD;  Location: [facility];  Service: Gastroenterology;  Laterality: N/A;   HAMMER TOE SURGERY Left 1980's   MOHS SURGERY  20087   "right upper outer lip"   POLYPECTOMY  04/06/2020   Procedure: POLYPECTOMY;  Surgeon: Jerene Bears, MD;  Location: [facility];  Service: Gastroenterology;;   TONSILLECTOMY  ~ [place] Left ~ 1950   "ran arm thru window"   Social History   Occupational History   Occupation: retired  Tobacco Use   Smoking status: Former    Packs/day: 1.00    Years: 30.00    Pack years: 30.00    Types: Cigarettes    Quit date: 06/29/1983    Years since quitting: 37.6   Smokeless tobacco: Never  Vaping Use   Vaping Use: Never used  Substance and Sexual Activity   Alcohol use: Yes    Alcohol/week: 0.0 standard drinks    Comment: 09/15/2013 "glass of wine maybe once/year"   Drug use: No   Sexual activity: Not Currently

## 2021-02-01 ENCOUNTER — Ambulatory Visit: Payer: Medicare Other | Admitting: Nurse Practitioner

## 2021-02-02 ENCOUNTER — Ambulatory Visit: Payer: Medicare Other | Admitting: Nurse Practitioner

## 2021-02-05 NOTE — Progress Notes (Signed)
MEDICARE ANNUAL WELLNESS VISIT AND FOLLOW UP  Assessment:    Encounter for Medicare annual wellness exam  Due Annually  Health Maintenance Reviewed  Healthy lifestyle reviewed and goals set   Secondary hyperparathyroidism monitor  Essential hypertension - continue medications, DASH diet, exercise and monitor at home. Call if greater than 130/80.  - CBC  OSA Does not use  CKD stage 4 due to type 2 diabetes mellitus (HCC) Increase fluids, avoid NSAIDS, monitor sugars, will monitor CMP  Type II diabetes mellitus with neurological manifestations [facility]) Discussed general issues about diabetes pathophysiology and management., Educational material distributed., Suggested low cholesterol diet., Encouraged aerobic exercise., Discussed foot care. Increase by 2 units in the AM if sugars over 150 Increase exercise, likely increased due to lack of activity with COVID Keep food and sugar log A1c  Hypothyroidism, unspecified type Hypothyroidism-check TSH level, continue medications the same, reminded to take on an empty stomach 30-29mns before food.  -TSH  Seizure disorder ([place] Continue meds  Type 2 diabetes mellitus with hyperlipidemia ([place] Patient reports  Insulin dependent diabetes mellitus ([place] Discussed general issues about diabetes pathophysiology and management., Educational material distributed., Suggested low cholesterol diet., Encouraged aerobic exercise., Discussed foot care. A1c  Hyperlipidemia check lipids- do crestor every other day since difficult to cut decrease fatty foods increase activity.  GOAL IS 70 Lipid panel  Morbid obesity (BMI 39.86)  (HCC) - follow up 3 months for progress monitoring - increase veggies, decrease carbs - long discussion about weight loss, diet, and exercise  Iron deficiency anemia, unspecified iron deficiency anemia type -     Due to CKD  Venous (peripheral) insufficiency - weight loss discussed, start compression stockings  and elevation  Benign neoplasm of colon, unspecified part of colon Monitor  Gastroesophageal reflux disease, esophagitis presence not specified Continue PPI/H2 blocker, diet discussed  Vitamin D deficiency Continue supplement  ACE inhibitor intolerance Due to CKD  Medication management 1 year  Non-pressure chronic ulcer of right heel and midfoot limited to breakdown of skin ([place]  Continue to follow with Dr. DSharol GivenEncourage daily skin inspection of feet  Over 40 minutes of face to face interview,  exam, counseling, chart review, and critical decision making was performed.  Future Appointments  Date Time Provider DPine Hollow 02/21/2021  9:45 AM [facility] None  02/21/2021 10:20 AM KBrunetta Genera MD [facility] None  02/26/2021  1:30 PM DNewt Minion MD [facility] None  05/07/2021 11:30 AM MUnk Pinto MD [facility] None  06/05/2021  9:00 AM ANewton Pigg RPH [facility] None  11/05/2021 11:00 AM MUnk Pinto MD [facility] None  02/07/2022 11:00 AM MMagda Bernheim NP [facility] None    Plan:   During the course of the visit the patient was educated and counseled about appropriate screening and preventive services including:    Pneumococcal vaccine  Influenza vaccine Td vaccine Prevnar 13 Screening electrocardiogram Screening mammography Bone densitometry screening Colorectal cancer screening Diabetes screening Glaucoma screening Nutrition counseling  Advanced directives: given info/requested copies.    Subjective:   ESHERREY NORTHis a 87y.o. female who presents for Medicare Annual Wellness Visit and 3 month follow up on hypertension, prediabetes, hyperlipidemia, vitamin D def.  She reports overall she is doing well.  She has no health or medication concerns today. She uses freestyle libre to check blood glucose.  She is checking her blood glucose at times four times a day and as needed.  She reports difficultly getting blood  from the  area.   Her blood pressure has been controlled at home, today their BP is BP: (!) 130/58.  BP Readings from Last 3 Encounters:  02/07/21 (!) 130/58  11/03/20 120/68  10/20/20 (!) 126/44     Son drives her, she has not driven in several years. She lives at [place] home, has [employer] certified [employer], and has house so she gets out in her garden.   She does workout. She denies chest pain, shortness of breath, dizziness.   Has been following with derm, skin surgery center, history of [place] She is following with ortho for left knee pain.   Follows with neuro for seizures, Dr. Clabe Seal, she is on Keppra three times a day.    She has been working on diet and exercise for diabetes  with stage 4 CKD NOT on ACE due to CKD She does have a sore on the bottom of right foot, following with Dr. Sharol Given Hyperlipidemia on crestor 5 mg, has been take 1/2 pill but states that it is hard to cut, at goal she is on bASA  she is on 20 units of humulin in the morning and 20 at night  Sugars have been running 113-150 in the morning.  Lowest was 99 she was asymptomatic.  Highest 170's fasting. Eye Doctor Dr. Ellie Lunch denies foot ulcerations, hyperglycemia, hypoglycemia , increased appetite, nausea, paresthesia of the feet, polydipsia, polyuria, visual disturbances, vomiting and weight loss.  Last A1C in the office was:  Lab Results  Component Value Date   HGBA1C 6.6 (H) 11/03/2020   Last GFR Lab Results  Component Value Date   GFRAA 28 (L) 05/05/2020   Lab Results  Component Value Date   CHOL 108 11/03/2020   HDL 27 (L) 11/03/2020   LDLCALC 60 11/03/2020   TRIG 125 11/03/2020   CHOLHDL 4.0 11/03/2020    Patient is on Vitamin D supplement. Lab Results  Component Value Date   VD25OH 45 11/03/2020     BMI is Body mass index is 34.64 kg/m., she is working on diet and exercise. Wt Readings from Last 3 Encounters:  02/07/21 189 lb 6.4 oz (85.9 kg)  11/03/20 186 lb 6.4 oz (84.6 kg)  10/20/20 189 lb 9.6  oz (86 kg)   She is on thyroid medication. Her medication was not changed last visit.   Lab Results  Component Value Date   TSH 2.14 11/03/2020  .   Medication Review  Current Outpatient Medications (Endocrine & Metabolic):    HUMULIN 70/30 (70-30) 100 UNIT/ML injection, INJECT 20 UNITS UNDER THE SKIN TWICE DAILY WITH A MEAL   levothyroxine (SYNTHROID) 100 MCG tablet, TAKE 1 TABLET BY MOUTH DAILY ON AN EMPTY STOMACH WITH ONLY WATER FOR 30 MINUTES. NO ANTACIDS, CALCIUM OR MAGNESIUM FOR 4 HOURS. AVOID BIOTIN  Current Outpatient Medications (Cardiovascular):    atenolol (TENORMIN) 50 MG tablet, Take 1 tablet (50 mg total) by mouth daily.   furosemide (LASIX) 40 MG tablet, Take  1 tablet  Daily  for Fluid Retention & Ankle Swelling  Current Outpatient Medications (Respiratory):    loratadine (CLARITIN) 10 MG tablet, Take 10 mg by mouth daily.  Current Outpatient Medications (Analgesics):    acetaminophen (TYLENOL) 500 MG tablet, Take 1,000 mg by mouth every 6 (six) hours as needed for moderate pain.   allopurinol (ZYLOPRIM) 100 MG tablet, Take  1 tablet  Daily  to prevent Gout. (Patient taking differently: Take 100 mg by mouth 2 (two) times daily. Take  1 tablet  Daily  to prevent Gout.)   aspirin 81 MG tablet, Take 81 mg by mouth daily.  Current Outpatient Medications (Hematological):    vitamin B-12 (CYANOCOBALAMIN) 100 MCG tablet, Take 500 mcg by mouth daily.  Current Outpatient Medications (Other):    cholecalciferol (VITAMIN D3) 25 MCG (1000 UNIT) tablet, Take 1,000 Units by mouth daily.   fish oil-omega-3 fatty acids 1000 MG capsule, Take 1,000 mg by mouth at bedtime.   hydrocortisone (ANUSOL-HC) 2.5 % rectal cream, Place rectally 2 (two) times daily.   lansoprazole (PREVACID) 30 MG capsule, TAKE 1 CAPSULE BY MOUTH DAILY TO PREVENT HEARTBURN AND INDIGESTION.   levETIRAcetam (KEPPRA) 500 MG tablet, Take  1 tablet  3 x /day  with Meals  to Prevent Seizures   meclizine (ANTIVERT) 25  MG tablet, TAKE 1 TABLET BY MOUTH THREE TIMES DAILY ONLY IF NEEDED FOR DIZZINESS OR VERTIGO   rOPINIRole (REQUIP) 1 MG tablet, Take  1/2 to 1 tablet  3 x /day  as needed for Restless Legs & Cramps / Patient knows to take by mouth   vitamin C (ASCORBIC ACID) 500 MG tablet, Take 500 mg by mouth daily.   Blood Glucose Monitoring Suppl (ONETOUCH VERIO) w/Device KIT, Check  Blood Sugar   4 x /day before Meals & Bedtime   (( Dx-  e11.22 ))   glucose blood (ONETOUCH VERIO) test strip, Check Blood Sugar 4 x /day  (( Dx-  e11.22 ))   Lancets (ONETOUCH ULTRASOFT) lancets, Check Blood Sugar  4 x /day  before Meals & Bedtime   (( Dx-  e11.22 ))  Current Problems (verified) Patient Active Problem List   Diagnosis Date Noted   Benign neoplasm of cecum    Benign neoplasm of rectum    Blood in stool    Melena    Acute blood loss anemia    GI bleed 04/04/2020   Symptomatic anemia 11/02/2019   DM neuropathy, type II diabetes mellitus (HCC)    Stage 4 chronic kidney disease (HCC)    Obesity (BMI 30-39.9)    High cholesterol    Aortic valve thickening by Cardiac echo in 2015 11/01/2019   Iron deficiency anemia 05/10/2019   Secondary hyperparathyroidism of renal origin ([place]) 09/20/2018   Type 2 diabetes mellitus with stage 4 chronic kidney disease, with long-term current use of insulin ([place]) 12/08/2017   ACE inhibitor intolerance 12/08/2017   Vertigo of central origin 11/20/2015   Morbid obesity ([place]) BMI 35+ with T2DM, htn, hyperlipidemia, CKD, OSA 12/12/2014   OSA on CPAP 08/08/2014   Type II diabetes mellitus with neurological manifestations ([place]) 09/15/2013   Hyperlipidemia associated with type 2 diabetes mellitus ([place]) 06/27/2013   Vitamin D deficiency 06/27/2013   Seizure disorder ([place]) 11/04/2012   Epilepsy ([place]) 11/04/2012   Anemia 06/13/2008   COLONIC POLYPS 06/09/2008   Hypothyroidism 06/09/2008   CKD stage 4 due to type 2 diabetes mellitus ([place]) 06/09/2008   Venous (peripheral)  insufficiency 06/09/2008   Gastroesophageal reflux disease 06/09/2008   Essential hypertension 06/09/2008    Screening Tests Immunization History  Administered Date(s) Administered   DTaP 03/18/2007   Influenza, High Dose Seasonal PF 10/18/2013, 12/12/2014, 10/24/2015, 10/28/2016, 12/08/2017, 12/01/2018   Influenza-Unspecified 11/18/2012, 12/02/2019, 11/24/2020   PFIZER(Purple Top)SARS-COV-2 Vaccination 03/10/2019, 03/31/2019, 11/26/2019   Pneumococcal Conjugate-13 12/12/2014   Pneumococcal Polysaccharide-23 09/16/2013   Td 12/08/2017   Zoster, Live 02/28/2005    Preventative care: Last colonoscopy: 2010 no follow up due to age Last mammogram: 11/2017 , complete DEXA: 2017,  declines Echo 2015 Sleep study 2016 SARS-COV2-Booster 11/26/19  Prior vaccinations: TD: 2019  Influenza: 11/24/20  Pneumococcal: 2015 Prevnar13: 2016 Shingles/Zostavax: 2007  Names of Other Physician/Practitioners you currently use: 1. [facility]- here for primary care 2. Dr. Ellie Lunch, eye doctor, last visit 2022 3. Dr. Lovena Le, dentist, last visit 2022  Patient Care Team: Unk Pinto, MD as PCP - General (Internal Medicine) Rozetta Nunnery, MD as Consulting Physician (Otolaryngology) Teena Irani, MD (Inactive) as Consulting Physician (Gastroenterology) Marcy Panning, MD as Consulting Physician (Oncology) Claudia Desanctis, MD as Consulting Physician (Internal Medicine) Rush Landmark, [facility] as Pharmacist (Pharmacist)  Allergies Allergies  Allergen Reactions   Ppd [Tuberculin Purified Protein Derivative] Other (See Comments)    indurated    SURGICAL HISTORY She  has a past surgical history that includes Tonsillectomy (~ 1947); Wrist surgery (Left, ~ 1950); Cervical polypectomy; Hammer toe surgery (Left, 1980's); Mohs surgery (20087); Esophagogastroduodenoscopy (egd) with propofol (N/A, 04/05/2020); biopsy (04/05/2020); Colonoscopy with propofol (N/A,  04/06/2020); and polypectomy (04/06/2020). FAMILY HISTORY Her family history includes Alcohol abuse in her son; Diabetes in her son; Heart disease in her father and mother; Hypertension in her son and son; Ovarian cancer in her sister; Stroke in her mother. SOCIAL HISTORY She  reports that she quit smoking about 37 years ago. Her smoking use included cigarettes. She has a 30.00 pack-year smoking history. She has never used smokeless tobacco. She reports current alcohol use. She reports that she does not use drugs.  MEDICARE WELLNESS OBJECTIVES: Physical activity: Current Exercise Habits: The patient does not participate in regular exercise at present, Exercise limited by: orthopedic condition(s) Cardiac risk factors: Cardiac Risk Factors include: advanced age (>23mn, >>42women);diabetes mellitus;dyslipidemia;hypertension;obesity (BMI >30kg/m2);sedentary lifestyle Depression/mood screen:   [facility] 02/07/2021  Decreased Interest 0  Down, Depressed, Hopeless 0  PHQ - 2 Score 0  Some recent data might be hidden    ADLs:  In your present state of health, do you have any difficulty performing the following activities: 02/07/2021 04/03/2020  Hearing? N -  [place] N -  Difficulty concentrating or making decisions? N -  Walking or climbing stairs? Y -  Dressing or bathing? N -  Doing errands, shopping? YTempie Donning Comment uses shuttle at facility -  Some recent data might be hidden     Cognitive Testing  Alert? Yes  Normal Appearance?Yes  Oriented to person? Yes  Place? Yes   Time? Yes  Recall of three objects?  2/3  Can perform simple calculations? Yes  Displays appropriate judgment?Yes  Can read the correct time from a watch face?Yes  EOL planning: Does Patient Have a Medical Advance Directive?: No Would patient like information on creating a medical advance directive?: No - Patient declined  Review of Systems  Constitutional:  Negative for chills, fever and  weight loss.  HENT:  Negative for congestion and hearing loss.   Eyes:  Negative for blurred vision and double vision.  Respiratory:  Positive for shortness of breath (with exertion). Negative for cough.   Cardiovascular:  Positive for leg swelling (uses compression stockings). Negative for chest pain, palpitations and orthopnea.  Gastrointestinal:  Negative for abdominal pain, constipation, diarrhea, heartburn, nausea and vomiting.  Genitourinary:  Negative for dysuria.  Musculoskeletal:  Negative for falls, joint pain and myalgias.  Skin:  Negative for rash.       Small lesion bottom of right foot  Neurological:  Positive for sensory change (  feet and hands) and weakness (both legs). Negative for dizziness, tingling, tremors, loss of consciousness and headaches.  Psychiatric/Behavioral:  Negative for depression, memory loss and suicidal ideas.    Objective:   Today's Vitals   02/07/21 1126  BP: (!) 130/58  Pulse: 71  Temp: 97.7 F (36.5 C)  SpO2: 93%  Weight: 189 lb 6.4 oz (85.9 kg)    Body mass index is 34.64 kg/m.  General Appearance: Well nourished, in no apparent distress. Eyes: PERRLA, EOMs, conjunctiva no swelling or erythema Sinuses: No Frontal/maxillary tenderness ENT/Mouth: Ext aud canals clear, TMs without erythema, bulging. No erythema, swelling, or exudate on post pharynx.  Tonsils not swollen or erythematous. Hearing normal.  Neck: Supple, thyroid normal.  Respiratory: Respiratory effort normal, BS equal bilaterally without rales, rhonchi, wheezing or stridor.  Cardio: RRR with no MRGs. Brisk peripheral pulses without edema.  Abdomen: Soft, + BS.  Non tender, no guarding, rebound, hernias, masses. Lymphatics: Non tender without lymphadenopathy.  Musculoskeletal: Full ROM, 5/5 strength, Normal gait Skin: Warm, dry 42m ulcer right heel Neuro: Cranial nerves intact. No cerebellar symptoms.  Psych: Awake and oriented X 3, normal affect, Insight and Judgment  appropriate.   Medicare Attestation I have personally reviewed: The patient's medical and social history Their use of alcohol, tobacco or illicit drugs Their current medications and supplements The patient's functional ability including ADLs,fall risks, home safety risks, cognitive, and hearing and visual impairment Diet and physical activities Evidence for depression or mood disorders  The patient's weight, height, BMI, and visual acuity have been recorded in the chart.  I have made referrals, counseling, and provided education to the patient based on review of the above and I have provided the patient with a written personalized care plan for preventive services.     DMagda Bernheim NP   02/02/2020

## 2021-02-07 ENCOUNTER — Ambulatory Visit: Payer: Medicare Other | Admitting: Nurse Practitioner

## 2021-02-07 ENCOUNTER — Encounter: Payer: Self-pay | Admitting: Nurse Practitioner

## 2021-02-07 ENCOUNTER — Other Ambulatory Visit: Payer: Self-pay

## 2021-02-07 VITALS — BP 130/58 | HR 71 | Temp 97.7°F | Wt 189.4 lb

## 2021-02-07 DIAGNOSIS — D126 Benign neoplasm of colon, unspecified: Secondary | ICD-10-CM

## 2021-02-07 DIAGNOSIS — E1149 Type 2 diabetes mellitus with other diabetic neurological complication: Secondary | ICD-10-CM | POA: Diagnosis not present

## 2021-02-07 DIAGNOSIS — E1122 Type 2 diabetes mellitus with diabetic chronic kidney disease: Secondary | ICD-10-CM

## 2021-02-07 DIAGNOSIS — E1169 Type 2 diabetes mellitus with other specified complication: Secondary | ICD-10-CM

## 2021-02-07 DIAGNOSIS — N184 Chronic kidney disease, stage 4 (severe): Secondary | ICD-10-CM

## 2021-02-07 DIAGNOSIS — G40909 Epilepsy, unspecified, not intractable, without status epilepticus: Secondary | ICD-10-CM

## 2021-02-07 DIAGNOSIS — K219 Gastro-esophageal reflux disease without esophagitis: Secondary | ICD-10-CM

## 2021-02-07 DIAGNOSIS — E039 Hypothyroidism, unspecified: Secondary | ICD-10-CM

## 2021-02-07 DIAGNOSIS — I10 Essential (primary) hypertension: Secondary | ICD-10-CM

## 2021-02-07 DIAGNOSIS — I872 Venous insufficiency (chronic) (peripheral): Secondary | ICD-10-CM

## 2021-02-07 DIAGNOSIS — Z0000 Encounter for general adult medical examination without abnormal findings: Secondary | ICD-10-CM

## 2021-02-07 DIAGNOSIS — Z79899 Other long term (current) drug therapy: Secondary | ICD-10-CM

## 2021-02-07 DIAGNOSIS — E559 Vitamin D deficiency, unspecified: Secondary | ICD-10-CM

## 2021-02-07 DIAGNOSIS — D509 Iron deficiency anemia, unspecified: Secondary | ICD-10-CM

## 2021-02-07 DIAGNOSIS — R6889 Other general symptoms and signs: Secondary | ICD-10-CM | POA: Diagnosis not present

## 2021-02-07 DIAGNOSIS — M1A9XX1 Chronic gout, unspecified, with tophus (tophi): Secondary | ICD-10-CM

## 2021-02-07 DIAGNOSIS — N2581 Secondary hyperparathyroidism of renal origin: Secondary | ICD-10-CM

## 2021-02-07 DIAGNOSIS — Z0001 Encounter for general adult medical examination with abnormal findings: Secondary | ICD-10-CM

## 2021-02-07 DIAGNOSIS — L97411 Non-pressure chronic ulcer of right heel and midfoot limited to breakdown of skin: Secondary | ICD-10-CM

## 2021-02-07 DIAGNOSIS — E785 Hyperlipidemia, unspecified: Secondary | ICD-10-CM

## 2021-02-07 MED ORDER — ALLOPURINOL 100 MG PO TABS: 100.0000 mg | ORAL_TABLET | Freq: Two times a day (BID) | ORAL | 2 refills | Status: DC

## 2021-02-07 NOTE — Patient Instructions (Signed)

## 2021-02-08 LAB — CBC WITH DIFFERENTIAL/PLATELET
Absolute Monocytes: 330 cells/uL (ref 200–950)
Basophils Absolute: 28 cells/uL (ref 0–200)
Basophils Relative: 0.5 %
Eosinophils Absolute: 693 cells/uL — ABNORMAL HIGH (ref 15–500)
Eosinophils Relative: 12.6 %
HCT: 34.1 % — ABNORMAL LOW (ref 35.0–45.0)
Hemoglobin: 10.3 g/dL — ABNORMAL LOW (ref 11.7–15.5)
Lymphs Abs: 721 cells/uL — ABNORMAL LOW (ref 850–3900)
MCH: 26.3 pg — ABNORMAL LOW (ref 27.0–33.0)
MCHC: 30.2 g/dL — ABNORMAL LOW (ref 32.0–36.0)
MCV: 87.2 fL (ref 80.0–100.0)
MPV: 10.4 fL (ref 7.5–12.5)
Monocytes Relative: 6 %
Neutro Abs: 3729 cells/uL (ref 1500–7800)
Neutrophils Relative %: 67.8 %
Platelets: 340 10*3/uL (ref 140–400)
RBC: 3.91 10*6/uL (ref 3.80–5.10)
RDW: 19.3 % — ABNORMAL HIGH (ref 11.0–15.0)
Total Lymphocyte: 13.1 %
WBC: 5.5 10*3/uL (ref 3.8–10.8)

## 2021-02-08 LAB — COMPLETE METABOLIC PANEL WITH GFR
AG Ratio: 1.8 (calc) (ref 1.0–2.5)
ALT: 9 U/L (ref 6–29)
AST: 15 U/L (ref 10–35)
Albumin: 4.3 g/dL (ref 3.6–5.1)
Alkaline phosphatase (APISO): 65 U/L (ref 37–153)
BUN/Creatinine Ratio: 21 (calc) (ref 6–22)
BUN: 43 mg/dL — ABNORMAL HIGH (ref 7–25)
CO2: 23 mmol/L (ref 20–32)
Calcium: 9.8 mg/dL (ref 8.6–10.4)
Chloride: 109 mmol/L (ref 98–110)
Creat: 2.05 mg/dL — ABNORMAL HIGH (ref 0.60–0.95)
Globulin: 2.4 g/dL (calc) (ref 1.9–3.7)
Glucose, Bld: 116 mg/dL — ABNORMAL HIGH (ref 65–99)
Potassium: 5.3 mmol/L (ref 3.5–5.3)
Sodium: 141 mmol/L (ref 135–146)
Total Bilirubin: 0.5 mg/dL (ref 0.2–1.2)
Total Protein: 6.7 g/dL (ref 6.1–8.1)
eGFR: 23 mL/min/{1.73_m2} — ABNORMAL LOW (ref >=60)

## 2021-02-08 LAB — HEMOGLOBIN A1C
Hgb A1c MFr Bld: 5.8 % of total Hgb — ABNORMAL HIGH (ref <5.7)
Mean Plasma Glucose: 120 mg/dL
eAG (mmol/L): 6.6 mmol/L

## 2021-02-08 LAB — LIPID PANEL
Cholesterol: 100 mg/dL (ref <200)
HDL: 24 mg/dL — ABNORMAL LOW (ref >=50)
LDL Cholesterol (Calc): 55 mg/dL (calc)
Non-HDL Cholesterol (Calc): 76 mg/dL (calc) (ref <130)
Total CHOL/HDL Ratio: 4.2 (calc) (ref <5.0)
Triglycerides: 129 mg/dL (ref <150)

## 2021-02-08 LAB — TSH: TSH: 1.9 mIU/L (ref 0.40–4.50)

## 2021-02-21 ENCOUNTER — Inpatient Hospital Stay: Payer: Medicare Other | Attending: Hematology | Admitting: Hematology

## 2021-02-21 ENCOUNTER — Other Ambulatory Visit: Payer: Self-pay

## 2021-02-21 ENCOUNTER — Inpatient Hospital Stay: Payer: Medicare Other

## 2021-02-21 VITALS — BP 126/44 | HR 93 | Temp 97.2°F | Resp 18 | Wt 190.6 lb

## 2021-02-21 DIAGNOSIS — D471 Chronic myeloproliferative disease: Secondary | ICD-10-CM | POA: Diagnosis present

## 2021-02-21 DIAGNOSIS — D649 Anemia, unspecified: Secondary | ICD-10-CM | POA: Insufficient documentation

## 2021-02-21 DIAGNOSIS — Z1589 Genetic susceptibility to other disease: Secondary | ICD-10-CM | POA: Diagnosis not present

## 2021-02-21 DIAGNOSIS — D62 Acute posthemorrhagic anemia: Secondary | ICD-10-CM

## 2021-02-21 LAB — CBC
HCT: 35 % — ABNORMAL LOW (ref 36.0–46.0)
Hemoglobin: 10.3 g/dL — ABNORMAL LOW (ref 12.0–15.0)
MCH: 26 pg (ref 26.0–34.0)
MCHC: 29.4 g/dL — ABNORMAL LOW (ref 30.0–36.0)
MCV: 88.4 fL (ref 80.0–100.0)
Platelets: 334 10*3/uL (ref 150–400)
RBC: 3.96 MIL/uL (ref 3.87–5.11)
RDW: 21 % — ABNORMAL HIGH (ref 11.5–15.5)
WBC: 6 10*3/uL (ref 4.0–10.5)
nRBC: 0 % (ref 0.0–0.2)

## 2021-02-26 ENCOUNTER — Other Ambulatory Visit: Payer: Self-pay

## 2021-02-26 ENCOUNTER — Telehealth: Payer: Self-pay | Admitting: Hematology

## 2021-02-26 ENCOUNTER — Ambulatory Visit: Payer: Medicare Other | Admitting: Orthopedic Surgery

## 2021-02-26 DIAGNOSIS — L97411 Non-pressure chronic ulcer of right heel and midfoot limited to breakdown of skin: Secondary | ICD-10-CM

## 2021-02-26 NOTE — Telephone Encounter (Signed)
Left message with follow-up appointment per 1/4 los.

## 2021-02-27 ENCOUNTER — Encounter: Payer: Self-pay | Admitting: Hematology

## 2021-02-27 ENCOUNTER — Encounter (HOSPITAL_COMMUNITY): Payer: Self-pay

## 2021-02-27 ENCOUNTER — Encounter: Payer: Self-pay | Admitting: Orthopedic Surgery
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
## 2021-02-27 NOTE — Progress Notes (Signed)
Office Visit Note   Patient: [name] [name]           Date of Birth: 10-02-1932           MRN: [id number] Visit Date: 02/26/2021              Requested by: Unk Pinto, [address] [place],  [place] [address] PCP: Unk Pinto, MD  Chief Complaint  Patient presents with   Right Foot - Follow-up      HPI: Patient is an 88 year old woman who presents in follow-up for right heel ulcer.  She is currently in new balance shoes complains of onychomycotic nails.  She states that about a week and a half ago she noticed redness around the right great toe.  She does have fixed clawing of the great toe.  Assessment & Plan: Visit Diagnoses:  1. Non-pressure chronic ulcer of right heel and midfoot limited to breakdown of skin ([place])     Plan: A heel insert was removed from her sneaker to give her extra-depth for the great toe recommended against using her Finn comfort shoes which seem to have a narrow toebox and were rubbing on the great toe.  Follow-Up Instructions: Return in about 2 months (around 04/26/2021).   Ortho Exam  Patient is alert, oriented, no adenopathy, well-dressed, normal affect, normal respiratory effort. Examination patient does have thickened discolored onychomycotic nails x4 she is unable to safely trim them on her own and nails were trimmed x4.  The right heel ulcer was debrided with a 10 blade knife of skin and soft tissue after debridement it is 10 mm in diameter and superficial there is no exposed bone or tendon.  She has fixed clawing of the IP joint of the great toe and appears to have been rubbing in her shoe there is no signs of infection no open ulcers.  Imaging: No results found. No images are attached to the encounter.  Labs: Lab Results  Component Value Date   HGBA1C 5.8 (H) 02/07/2021   HGBA1C 6.6 (H) 11/03/2020   HGBA1C 6.6 (H) 05/05/2020   ESRSEDRATE 9 09/29/2019   ESRSEDRATE 0 05/03/2019   ESRSEDRATE 1 05/02/2014    CRP 14.7 (H) 09/29/2019   LABURIC 8.9 (H) 11/03/2020   LABURIC 8.7 (H) 05/05/2020   LABURIC 14.8 (H) 11/01/2019   REPTSTATUS 09/07/2019 FINAL 09/04/2019   CULT >=100,000 COLONIES/mL ESCHERICHIA COLI (A) 09/04/2019   LABORGA ESCHERICHIA COLI (A) 09/04/2019     Lab Results  Component Value Date   ALBUMIN 4.1 10/20/2020   ALBUMIN 4.0 06/21/2020   ALBUMIN 4.2 04/24/2020    Lab Results  Component Value Date   MG 2.2 11/03/2020   MG 2.4 05/05/2020   MG 2.2 04/05/2020   Lab Results  Component Value Date   VD25OH 45 11/03/2020   VD25OH 49 05/05/2020   VD25OH 56 11/01/2019    No results found for: PREALBUMIN CBC EXTENDED Latest Ref Rng & Units 02/21/2021 02/07/2021 11/03/2020  WBC 4.0 - 10.5 K/uL 6.0 5.5 5.9  RBC 3.87 - 5.11 MIL/uL 3.96 3.91 4.53  HGB 12.0 - 15.0 g/dL 10.3(L) 10.3(L) 11.5(L)  HCT 36.0 - 46.0 % 35.0(L) 34.1(L) 39.2  PLT 150 - 400 K/uL 334 340 320  NEUTROABS 1,500 - 7,800 cells/uL - 3,729 4,083  LYMPHSABS 850 - 3,900 cells/uL - 721(L) 808(L)     There is no height or weight on file to calculate BMI.  Orders:  No orders of the defined types  were placed in this encounter.  No orders of the defined types were placed in this encounter.    Procedures: No procedures performed  Clinical Data: No additional findings.  ROS:  All other systems negative, except as noted in the HPI. Review of Systems  Objective: Vital Signs: There were no vitals taken for this visit.  Specialty Comments:  No specialty comments available.  PMFS History: Patient Active Problem List   Diagnosis Date Noted   Benign neoplasm of cecum    Benign neoplasm of rectum    Blood in stool    Melena    Acute blood loss anemia    GI bleed 04/04/2020   Symptomatic anemia 11/02/2019   DM neuropathy, type II diabetes mellitus (HCC)    Stage 4 chronic kidney disease (HCC)    Obesity (BMI 30-39.9)    High cholesterol    Aortic valve thickening by Cardiac echo in 2015 11/01/2019    Iron deficiency anemia 05/10/2019   Secondary hyperparathyroidism of renal origin ([place]) 09/20/2018   Type 2 diabetes mellitus with stage 4 chronic kidney disease, with long-term current use of insulin ([place]) 12/08/2017   ACE inhibitor intolerance 12/08/2017   Vertigo of central origin 11/20/2015   Morbid obesity ([place]) BMI 35+ with T2DM, htn, hyperlipidemia, CKD, OSA 12/12/2014   OSA on CPAP 08/08/2014   Type II diabetes mellitus with neurological manifestations ([place]) 09/15/2013   Hyperlipidemia associated with type 2 diabetes mellitus ([place]) 06/27/2013   Vitamin D deficiency 06/27/2013   Seizure disorder ([place]) 11/04/2012   Epilepsy ([place]) 11/04/2012   Anemia 06/13/2008   COLONIC POLYPS 06/09/2008   Hypothyroidism 06/09/2008   CKD stage 4 due to type 2 diabetes mellitus ([place]) 06/09/2008   Venous (peripheral) insufficiency 06/09/2008   Gastroesophageal reflux disease 06/09/2008   Essential hypertension 06/09/2008   Past Medical History:  Diagnosis Date   Anemia, unspecified    Arthritis    "knees; right shoulder" (09/15/2013)   Basal cell carcinoma    "right upper outer lip"   DM neuropathy, type II diabetes mellitus ([place])    Dyspnea 2021   with low iron   GERD (gastroesophageal reflux disease)    Gout    High cholesterol    Hypertension    Hypothyroidism    Meniere's disease    Obesity (BMI 30-39.9)    Other forms of epilepsy and recurrent seizures without mention of intractable epilepsy 11/04/2012   Non convulsive paroxysmal spells, responding to [facility], patient not driving.    Pneumonia ~ 1943   Skin cancer    "forehead; right hand"   Stage 4 chronic kidney disease ([place])    UTI (urinary tract infection)    Vitamin D deficiency     Family History  Problem Relation Age of Onset   Heart disease Mother    Stroke Mother    Heart disease Father    Ovarian cancer Sister    Hypertension Son    Hypertension Son    Diabetes Son    Alcohol abuse Son     Past Surgical  History:  Procedure Laterality Date   BIOPSY  04/05/2020   Procedure: BIOPSY;  Surgeon: Jerene Bears, MD;  [facility];  Service: Gastroenterology;;   CERVICAL POLYPECTOMY     COLONOSCOPY WITH PROPOFOL N/A 04/06/2020   Procedure: COLONOSCOPY WITH PROPOFOL;  Surgeon: Jerene Bears, MD;  [facility];  Service: Gastroenterology;  Laterality: N/A;   ESOPHAGOGASTRODUODENOSCOPY (EGD) WITH PROPOFOL N/A 04/05/2020   Procedure: ESOPHAGOGASTRODUODENOSCOPY (  EGD) WITH PROPOFOL;  Surgeon: Jerene Bears, MD;  Location: [facility];  Service: Gastroenterology;  Laterality: N/A;   HAMMER TOE SURGERY Left 1980's   MOHS SURGERY  [address]87   "right upper outer lip"   POLYPECTOMY  04/06/2020   Procedure: POLYPECTOMY;  Surgeon: Jerene Bears, MD;  [facility];  Service: Gastroenterology;;   TONSILLECTOMY  ~ [place] Left ~ 1950   "ran arm thru window"   Social History   Occupational History   Occupation: retired  Tobacco Use   Smoking status: Former    Packs/day: 1.00    Years: 30.00    Pack years: 30.00    Types: Cigarettes    Quit date: 06/29/1983    Years since quitting: 37.6   Smokeless tobacco: Never  Vaping Use   Vaping Use: Never used  Substance and Sexual Activity   Alcohol use: Yes    Alcohol/week: 0.0 standard drinks    Comment: 09/15/2013 "glass of wine maybe once/year"   Drug use: No   Sexual activity: Not Currently

## 2021-03-14 ENCOUNTER — Encounter: Payer: Self-pay | Admitting: Internal Medicine

## 2021-03-21 ENCOUNTER — Other Ambulatory Visit: Payer: Self-pay | Admitting: Internal Medicine

## 2021-03-21 DIAGNOSIS — L97511 Non-pressure chronic ulcer of other part of right foot limited to breakdown of skin: Secondary | ICD-10-CM

## 2021-04-23 ENCOUNTER — Ambulatory Visit: Payer: Medicare Other | Admitting: Orthopedic Surgery

## 2021-04-23 DIAGNOSIS — L97411 Non-pressure chronic ulcer of right heel and midfoot limited to breakdown of skin: Secondary | ICD-10-CM

## 2021-04-24 ENCOUNTER — Encounter: Payer: Self-pay | Admitting: Orthopedic Surgery

## 2021-04-24 NOTE — Progress Notes (Signed)
? ?Office Visit Note ?  ?Patient: [name] [name]           ?Date of Birth: Jan 05, 1933           ?MRN: [id number] ?Visit Date: 04/23/2021 ?             ?Requested by: Unk Pinto, MD ?[address] ?[address] ?[place],  [place] [address] ?PCP: Unk Pinto, MD ? ?Chief Complaint  ?Patient presents with  ? Right Foot - Follow-up  ? ? ? ? ?HPI: ?Patient is an 88 year old woman who presents in follow-up for Wagner grade 1 ulcer beneath the right heel.  She has been weightbearing in a postoperative shoe using antibiotic ointment and a Band-Aid. ? ?Assessment & Plan: ?Visit Diagnoses:  ?1. Non-pressure chronic ulcer of right heel and midfoot limited to breakdown of skin ([place])   ? ? ?Plan: Ulcer was debrided of skin and soft tissue, continue protective shoe wear ? ?Follow-Up Instructions: Return in about 2 months (around 06/23/2021).  ? ?Ortho Exam ? ?Patient is alert, oriented, no adenopathy, well-dressed, normal affect, normal respiratory effort. ?Examination patient has healing ulcer right heel, after informed consent the ulcer was debrided of skin and soft tissue, after debridement the ulcer is 10 mm in diameter 1 mm deep.  The good healthy granulation tissue with superficial epithelialization around the edges. ? ?Imaging: ?No results found. ?No images are attached to the encounter. ? ?Labs: ?Lab Results  ?Component Value Date  ? HGBA1C 5.8 (H) 02/07/2021  ? HGBA1C 6.6 (H) 11/03/2020  ? HGBA1C 6.6 (H) 05/05/2020  ? ESRSEDRATE 9 09/29/2019  ? ESRSEDRATE 0 05/03/2019  ? ESRSEDRATE 1 05/02/2014  ? CRP 14.7 (H) 09/29/2019  ? LABURIC 8.9 (H) 11/03/2020  ? LABURIC 8.7 (H) 05/05/2020  ? LABURIC 14.8 (H) 11/01/2019  ? REPTSTATUS 09/07/2019 FINAL 09/04/2019  ? CULT >=100,000 COLONIES/mL ESCHERICHIA COLI (A) 09/04/2019  ? LABORGA ESCHERICHIA COLI (A) 09/04/2019  ? ? ? ?Lab Results  ?Component Value Date  ? ALBUMIN 4.1 10/20/2020  ? ALBUMIN 4.0 06/21/2020  ? ALBUMIN 4.2 04/24/2020  ? ? ?Lab Results  ?Component Value  Date  ? MG 2.2 11/03/2020  ? MG 2.4 05/05/2020  ? MG 2.2 04/05/2020  ? ?Lab Results  ?Component Value Date  ? VD25OH 45 11/03/2020  ? VD25OH 49 05/05/2020  ? VD25OH 56 11/01/2019  ? ? ?No results found for: PREALBUMIN ?CBC EXTENDED Latest Ref Rng & Units 02/21/2021 02/07/2021 11/03/2020  ?WBC 4.0 - 10.5 K/uL 6.0 5.5 5.9  ?RBC 3.87 - 5.11 MIL/uL 3.96 3.91 4.53  ?HGB 12.0 - 15.0 g/dL 10.3(L) 10.3(L) 11.5(L)  ?HCT 36.0 - 46.0 % 35.0(L) 34.1(L) 39.2  ?PLT 150 - 400 K/uL 334 340 320  ?NEUTROABS 1,500 - 7,800 cells/uL - 3,729 4,083  ?LYMPHSABS 850 - 3,900 cells/uL - 721(L) 808(L)  ? ? ? ?There is no height or weight on file to calculate BMI. ? ?Orders:  ?No orders of the defined types were placed in this encounter. ? ?No orders of the defined types were placed in this encounter. ? ? ? Procedures: ?No procedures performed ? ?Clinical Data: ?No additional findings. ? ?ROS: ? ?All other systems negative, except as noted in the HPI. ?Review of Systems ? ?Objective: ?Vital Signs: There were no vitals taken for this visit. ? ?Specialty Comments:  ?No specialty comments available. ? ?PMFS History: ?Patient Active Problem List  ? Diagnosis Date Noted  ? Benign neoplasm of cecum   ? Benign neoplasm of rectum   ?  Blood in stool   ? Melena   ? Acute blood loss anemia   ? GI bleed 04/04/2020  ? Symptomatic anemia 11/02/2019  ? DM neuropathy, type II diabetes mellitus ([place])   ? Stage 4 chronic kidney disease ([place])   ? Obesity (BMI 30-39.9)   ? High cholesterol   ? Aortic valve thickening by Cardiac echo in 2015 11/01/2019  ? Iron deficiency anemia 05/10/2019  ? Secondary hyperparathyroidism of renal origin ([place]) 09/20/2018  ? Type 2 diabetes mellitus with stage 4 chronic kidney disease, with long-term current use of insulin ([place]) 12/08/2017  ? ACE inhibitor intolerance 12/08/2017  ? Vertigo of central origin 11/20/2015  ? Morbid obesity ([place]) BMI 35+ with T2DM, htn, hyperlipidemia, CKD, OSA 12/12/2014  ? OSA on CPAP 08/08/2014  ? Type II  diabetes mellitus with neurological manifestations ([place]) 09/15/2013  ? Hyperlipidemia associated with type 2 diabetes mellitus ([place]) 06/27/2013  ? Vitamin D deficiency 06/27/2013  ? Seizure disorder ([place]) 11/04/2012  ? Epilepsy ([place]) 11/04/2012  ? Anemia 06/13/2008  ? COLONIC POLYPS 06/09/2008  ? Hypothyroidism 06/09/2008  ? CKD stage 4 due to type 2 diabetes mellitus ([place]) 06/09/2008  ? Venous (peripheral) insufficiency 06/09/2008  ? Gastroesophageal reflux disease 06/09/2008  ? Essential hypertension 06/09/2008  ? ?Past Medical History:  ?Diagnosis Date  ? Anemia, unspecified   ? Arthritis   ? "knees; right shoulder" (09/15/2013)  ? Basal cell carcinoma   ? "right upper outer lip"  ? DM neuropathy, type II diabetes mellitus ([place] )   ? Dyspnea 2021  ? with low iron  ? GERD (gastroesophageal reflux disease)   ? Gout   ? High cholesterol   ? Hypertension   ? Hypothyroidism   ? Meniere's disease   ? Obesity (BMI 30-39.9)   ? Other forms of epilepsy and recurrent seizures without mention of intractable epilepsy 11/04/2012  ? Non convulsive paroxysmal spells, responding to [facility], patient not driving.   ? Pneumonia ~ 1943  ? Skin cancer   ? "forehead; right hand"  ? Stage 4 chronic kidney disease ([place])   ? UTI (urinary tract infection)   ? Vitamin D deficiency   ?  ?Family History  ?Problem Relation Age of Onset  ? Heart disease Mother   ? Stroke Mother   ? Heart disease Father   ? Ovarian cancer Sister   ? Hypertension Son   ? Hypertension Son   ? Diabetes Son   ? Alcohol abuse Son   ?  ?Past Surgical History:  ?Procedure Laterality Date  ? BIOPSY  04/05/2020  ? Procedure: BIOPSY;  Surgeon: Jerene Bears, MD;  Location: [facility];  Service: Gastroenterology;;  ? CERVICAL POLYPECTOMY    ? COLONOSCOPY WITH PROPOFOL N/A 04/06/2020  ? Procedure: COLONOSCOPY WITH PROPOFOL;  Surgeon: Jerene Bears, MD;  Location: [facility];  Service: Gastroenterology;  Laterality: N/A;  ? ESOPHAGOGASTRODUODENOSCOPY (EGD) WITH PROPOFOL N/A  04/05/2020  ? Procedure: ESOPHAGOGASTRODUODENOSCOPY (EGD) WITH PROPOFOL;  Surgeon: Jerene Bears, MD;  Location: [facility];  Service: Gastroenterology;  Laterality: N/A;  ? HAMMER TOE SURGERY Left 1980's  ? MOHS SURGERY  [address]87  ? "right upper outer lip"  ? POLYPECTOMY  04/06/2020  ? Procedure: POLYPECTOMY;  Surgeon: Jerene Bears, MD;  Location: [facility];  Service: Gastroenterology;;  ? TONSILLECTOMY  ~ 1947  ? WRIST SURGERY Left ~ 1950  ? "ran arm thru window"  ? ?Social History  ? ?Occupational History  ? Occupation: retired  ?Tobacco Use  ?  Smoking status: Former  ?  Packs/day: 1.00  ?  Years: 30.00  ?  Pack years: 30.00  ?  Types: Cigarettes  ?  Quit date: 06/29/1983  ?  Years since quitting: 37.8  ? Smokeless tobacco: Never  ?Vaping Use  ? Vaping Use: Never used  ?Substance and Sexual Activity  ? Alcohol use: Yes  ?  Alcohol/week: 0.0 standard drinks  ?  Comment: 09/15/2013 "glass of wine maybe once/year"  ? Drug use: No  ? Sexual activity: Not Currently  ? ? ? ? ? ?

## 2021-05-07 ENCOUNTER — Ambulatory Visit: Payer: Medicare Other | Admitting: Internal Medicine

## 2021-05-13 NOTE — Progress Notes (Signed)
? ?Future Appointments  ?Date Time Provider Department  ?05/14/2021  9:30 AM Unk Pinto, MD [facility]  ?06/22/2021 10:20 AM Brunetta Genera, MD [facility]  ?11/05/2021 11:00 AM Unk Pinto, MD [facility]  ?02/07/2022 11:00 AM Magda Bernheim, NP [facility]  ? ? ?History of Present Illness: ? ? ?    This very nice 88 y.o. WWF presents for 6 month follow up with HTN, HLD, Insulin requiring T2_DM and Vitamin D Deficiency.  Patient has hx/o Gout quiescent on Allopurinol . Patient is also followed by Dr Brett Fairy for a Seizure Disorder & she is on CPAP for OSA with improved restorative Sleep. She is followed by Dr Irene Limbo for anemia presumed of Kidney disease.  ? ? ?    Patient is treated for HTN  (1999) & BP has been controlled at home. Cardiac echo in 2015 showed thickened Ao valve leaflets w/o stenosis or regurgitation. Today?s  . Patient has had no complaints of any cardiac type chest pain, palpitations, dyspnea / orthopnea / PND, dizziness, claudication, or dependent edema. ? ? ?    Hyperlipidemia is controlled with diet & meds. Patient denies myalgias or other med SE?s. Last Lipids were at goal : ? ?Lab Results  ?Component Value Date  ? CHOL 100 02/07/2021  ? HDL 24 (L) 02/07/2021  ? [place] 55 02/07/2021  ? TRIG 129 02/07/2021  ? CHOLHDL 4.2 02/07/2021  ? ? ? ?Also, the patient Patient has Morbid Obesity  (BMI  37 -> 34+) and consequent T2_NIDDM (2001) w/CKD4  (GFR 23). Patient is on bid Novolin 70/30 reporting CBG's usu ranging betw 100-180 mg%.   Patient Also has Secondary Hyperparathyroidism consequent of her Diabetic Kidney Disease. She  has had no symptoms of reactive hypoglycemia, diabetic polys, paresthesias or visual blurring.  Last A1c was near goal : ? ?Lab Results  ?Component Value Date  ? HGBA1C 5.8 (H) 02/07/2021  ? ? ?Patient  has been on thyroid replacement since the 1980's for Hypothyroidism.  ? ?                                                  Further, the patient also has history of  Vitamin D Deficiency  ("37" /2008) and supplements vitamin D without any suspected side-effects. Last vitamin D was still low  (goal 70-100) :  ? ?Lab Results  ?Component Value Date  ? VD25OH 45 11/03/2020  ? ? ? ?Current Outpatient Medications on File Prior to Visit  ?Medication Sig  ? acetaminophen 500 MG tablet Take 1,000 mg every 6 hours as needed   ? allopurinol  100 MG tablet Take 1 tablet 2  times day to prevent Gout.  ? aspirin 81 MG tablet Take  daily.  ? atenolol  50 MG tablet Take 1 tablet  daily.  ? VITAMIN D 1000 u Take 1,000 Units  daily.  ? fish oil-omega-3  1000 MG capsule Take 1,000 mg at bedtime.  ? furosemide 40 MG tablet TAKE 1 TABLET  DAILY   ? HUMULIN 70/30  INJECT 20 UNITS TWICE DAILY WITH A MEAL  ? ANUSOL-HC 2.5 % rectal cream Place rectally 2 ( times daily.  ? lansoprazole 30 MG capsule TAKE 1 CAPSULE DAILY   ? levETIRAcetam  ?rOPINIRole 1 MG tablet  ?vitamin B-12  100 MCG tablet  ?(KEPPRA) 500 MG tablet Take  1 tablet  3 x /day  with Meals  to Prevent Seizures  ? levothyroxine 100 MCG tablet TAKE 1 TABLET  DAILY   ? loratadine  10 MG tablet Take daily.  ? meclizine  25 MG tablet TAKE 1 TABLET THREE TIMES DAILY   ? Requip 1 mg Take  1/2 to 1 tablet  3 x /day  as needed   ?  Vitamin B 12 tabs  Take 500 mcg daily.  ? vitamin C 500 MG tablet Take  daily.  ? ? ? ? ?Allergies  ?Allergen Reactions  ? Ppd [Tuberculin Purified Protein Derivative] Other (See Comments)  ?  indurated  ? ? ? ?PMHx:   ?Past Medical History:  ?Diagnosis Date  ? Anemia, unspecified   ? Arthritis   ? "knees; right shoulder" (09/15/2013)  ? Basal cell carcinoma   ? "right upper outer lip"  ? DM neuropathy, type II diabetes mellitus ([place])   ? Dyspnea 2021  ? with low iron  ? GERD (gastroesophageal reflux disease)   ? Gout   ? High cholesterol   ? Hypertension   ? Hypothyroidism   ? Meniere's disease   ? Obesity (BMI 30-39.9)   ? Other forms of epilepsy and recurrent seizures without mention of intractable epilepsy 11/04/2012  ?  Non convulsive paroxysmal spells, responding to [facility], patient not driving.   ? Pneumonia ~ 1943  ? Skin cancer   ? "forehead; right hand"  ? Stage 4 chronic kidney disease ([place])   ? UTI (urinary tract infection)   ? Vitamin D deficiency   ? ? ? ? ? ? ?Past Surgical History:  ?Procedure Laterality Date  ? BIOPSY  04/05/2020  ? Procedure: BIOPSY;  Surgeon: Jerene Bears, MD;  Location: [facility];  Service: Gastroenterology;;  ? CERVICAL POLYPECTOMY    ? COLONOSCOPY WITH PROPOFOL N/A 04/06/2020  ? Procedure: COLONOSCOPY WITH PROPOFOL;  Surgeon: Jerene Bears, MD;  Location: [facility];  Service: Gastroenterology;  Laterality: N/A;  ? ESOPHAGOGASTRODUODENOSCOPY (EGD) WITH PROPOFOL N/A 04/05/2020  ? Procedure: ESOPHAGOGASTRODUODENOSCOPY (EGD) WITH PROPOFOL;  Surgeon: Jerene Bears, MD;  Location: [facility];  Service: Gastroenterology;  Laterality: N/A;  ? HAMMER TOE SURGERY Left 1980's  ? MOHS SURGERY  [address]87  ? "right upper outer lip"  ? POLYPECTOMY  04/06/2020  ? Procedure: POLYPECTOMY;  Surgeon: Jerene Bears, MD;  Location: [facility];  Service: Gastroenterology;;  ? TONSILLECTOMY  ~ 1947  ? WRIST SURGERY Left ~ 1950  ? "ran arm thru window"  ? ? ?FHx:    Reviewed / unchanged ? ?SHx:    Reviewed / unchanged  ? ?Systems Review: ? ?Constitutional: Denies fever, chills, wt changes, headaches, insomnia, fatigue, night sweats, change in appetite. ?Eyes: Denies redness, blurred vision, diplopia, discharge, itchy, watery eyes.  ?ENT: Denies discharge, congestion, post nasal drip, epistaxis, sore throat, earache, hearing loss, dental pain, tinnitus, vertigo, sinus pain, snoring.  ?CV: Denies chest pain, palpitations, irregular heartbeat, syncope, dyspnea, diaphoresis, orthopnea, PND, claudication or edema. ?Respiratory: denies cough, dyspnea, DOE, pleurisy, hoarseness, laryngitis, wheezing.  ?Gastrointestinal: Denies dysphagia, odynophagia, heartburn, reflux, water brash, abdominal pain or cramps, nausea, vomiting,  bloating, diarrhea, constipation, hematemesis, melena, hematochezia  or hemorrhoids. ?Genitourinary: Denies dysuria, frequency, urgency, nocturia, hesitancy, discharge, hematuria or flank pain. ?Musculoskeletal: Denies arthralgias, myalgias, stiffness, jt. swelling, pain, limping or strain/sprain.  ?Skin: Denies pruritus, rash, hives, warts, acne, eczema or change in skin lesion(s). ?Neuro: No weakness, tremor, incoordination, spasms, paresthesia or pain. ?Psychiatric: Denies  confusion, memory loss or sensory loss. ?Endo: Denies change in weight, skin or hair change.  ?Heme/Lymph: No excessive bleeding, bruising or enlarged lymph nodes. ? ?Physical Exam ? ?There were no vitals taken for this visit. ? ?Appears  well nourished, well groomed  and in no distress. ? ?Eyes: PERRLA, EOMs, conjunctiva no swelling or erythema. ?Sinuses: No frontal/maxillary tenderness ?ENT/Mouth: EAC's clear, TM's nl w/o erythema, bulging. Nares clear w/o erythema, swelling, exudates. Oropharynx clear without erythema or exudates. Oral hygiene is good. Tongue normal, non obstructing. Hearing intact.  ?Neck: Supple. Thyroid not palpable. Car 2+/2+ without bruits, nodes or JVD. ?Chest: Respirations nl with BS clear & equal w/o rales, rhonchi, wheezing or stridor.  ?Cor: Heart sounds normal w/ regular rate and rhythm without sig. murmurs, gallops, clicks or rubs. Peripheral pulses normal and equal  without edema.  ?Abdomen: Soft & bowel sounds normal. Non-tender w/o guarding, rebound, hernias, masses or organomegaly.  ?Lymphatics: Unremarkable.  ?Musculoskeletal: Full ROM all peripheral extremities, joint stability, 5/5 strength and normal gait.  ?Skin: Warm, dry without exposed rashes, lesions or ecchymosis apparent.  ?Neuro: Cranial nerves intact, reflexes equal bilaterally. Sensory-motor testing grossly intact. Tendon reflexes grossly intact.  ?Pysch: Alert & oriented x 3.  Insight and judgement nl & appropriate. No ideations. ? ?Assessment  and Plan: ? ?- Continue medication, monitor blood pressure at home.  ?- Continue DASH diet.  Reminder to go to the ER if any CP,  ?SOB, nausea, dizziness, severe HA, changes vision/speech. ? ?- Continue di

## 2021-05-14 ENCOUNTER — Encounter: Payer: Self-pay | Admitting: Internal Medicine

## 2021-05-14 ENCOUNTER — Other Ambulatory Visit: Payer: Self-pay

## 2021-05-14 ENCOUNTER — Ambulatory Visit: Payer: Medicare Other | Admitting: Internal Medicine

## 2021-05-14 VITALS — BP 122/78 | HR 77 | Temp 97.9°F | Resp 16 | Ht 62.0 in | Wt 190.6 lb

## 2021-05-14 DIAGNOSIS — Z79899 Other long term (current) drug therapy: Secondary | ICD-10-CM

## 2021-05-14 DIAGNOSIS — E039 Hypothyroidism, unspecified: Secondary | ICD-10-CM

## 2021-05-14 DIAGNOSIS — E559 Vitamin D deficiency, unspecified: Secondary | ICD-10-CM | POA: Diagnosis not present

## 2021-05-14 DIAGNOSIS — I10 Essential (primary) hypertension: Secondary | ICD-10-CM | POA: Diagnosis not present

## 2021-05-14 DIAGNOSIS — E785 Hyperlipidemia, unspecified: Secondary | ICD-10-CM

## 2021-05-14 DIAGNOSIS — E1122 Type 2 diabetes mellitus with diabetic chronic kidney disease: Secondary | ICD-10-CM | POA: Diagnosis not present

## 2021-05-14 DIAGNOSIS — N2581 Secondary hyperparathyroidism of renal origin: Secondary | ICD-10-CM

## 2021-05-14 DIAGNOSIS — E1169 Type 2 diabetes mellitus with other specified complication: Secondary | ICD-10-CM

## 2021-05-14 DIAGNOSIS — M1A9XX1 Chronic gout, unspecified, with tophus (tophi): Secondary | ICD-10-CM

## 2021-05-14 DIAGNOSIS — G40909 Epilepsy, unspecified, not intractable, without status epilepticus: Secondary | ICD-10-CM

## 2021-05-14 DIAGNOSIS — Z794 Long term (current) use of insulin: Secondary | ICD-10-CM

## 2021-05-14 DIAGNOSIS — N184 Chronic kidney disease, stage 4 (severe): Secondary | ICD-10-CM

## 2021-05-14 NOTE — Progress Notes (Signed)
? ?Future Appointments  ?Date Time Provider Department  ?05/14/2021  9:30 AM Unk Pinto, MD [facility]  ?06/22/2021 10:20 AM Brunetta Genera, MD [facility]  ?11/05/2021 11:00 AM Unk Pinto, MD [facility]  ?02/07/2022 11:00 AM Magda Bernheim, NP [facility]  ? ? ?History of Present Illness: ? ? ?    This very nice 88 y.o. WWF presents for 6 month follow up with HTN, HLD, Insulin requiring T2_DM and Vitamin D Deficiency.  Patient has hx/o Gout quiescent on Allopurinol . Patient is also followed by Dr Brett Fairy for a Seizure Disorder & she is on CPAP for OSA with improved restorative Sleep. She is followed by Dr Irene Limbo for anemia presumed of Kidney disease.  ? ? ?    Patient is treated for HTN  (1999) & BP has been controlled at home. Cardiac echo in 2015 showed thickened Ao valve leaflets w/o stenosis or regurgitation. Today?s BP is at goal -  122/78. Patient has had no complaints of any cardiac type chest pain, palpitations, dyspnea / orthopnea / PND, dizziness, claudication, or dependent edema. ? ? ?    Hyperlipidemia is controlled with diet & meds. Patient denies myalgias or other med SE?s. Last Lipids were at goal : ? ?Lab Results  ?Component Value Date  ? CHOL 100 02/07/2021  ? HDL 24 (L) 02/07/2021  ? [place] 55 02/07/2021  ? TRIG 129 02/07/2021  ? CHOLHDL 4.2 02/07/2021  ? ? ? ?Also, the patient Patient has Morbid Obesity  (BMI  37 -> 34+) and consequent T2_NIDDM (2001) w/CKD4  (GFR 23). Patient is on bid Novolin 70/30 reporting CBG's usu ranging betw 100-180 mg%.    Patient is followed by Dr Harrie Jeans  / Nephrologist for her CKD4. Patient Also has Secondary Hyperparathyroidism consequent of her Diabetic Kidney Disease. She  has had no symptoms of reactive hypoglycemia, diabetic polys, paresthesias or visual blurring.  Last A1c was near goal : ? ?Lab Results  ?Component Value Date  ? HGBA1C 5.8 (H) 02/07/2021  ? ? ?   Patient  has been on thyroid replacement since the 1980's for Hypothyroidism.  ? ?                                                   Further, the patient also has history of Vitamin D Deficiency  ("37" /2008) and supplements vitamin D without any suspected side-effects. Last vitamin D was still low  (goal 70-100) :  ? ?Lab Results  ?Component Value Date  ? VD25OH 45 11/03/2020  ? ? ?    ?Current Outpatient Medications on File Prior to Visit  ?Medication Sig  ? acetaminophen 500 MG tablet Take 1,000 mg every 6 hrs as needed   ? allopurinol 100 MG tablet Take  1 tablet  Daily  to prevent Gout.  ? aspirin 81 MG tablet Take  daily.  ? atenolol =50 MG tablet Take 1 tablet  daily.  ? VITAMIN D 1000 u Take  daily.  ? fish oil-omega-3 1000 MG capsule Take 1,000 mg  at bedtime.  ? furosemide (LASIX) 40 MG tablet Take  1 tablet  Daily  ? HUMULIN (70/30) injec INJECT 20 UNITS TWICE DAILY   ? ANUSOL-HC 2.5 % rectal cream Place rectally 2  times daily.  ? lansoprazole 30 MG capsule Take  1 capsule  Daily    ?  levETIRAcetam (KEPPRA) 500 MG  Take  1 tablet  3 x /day  with Meals   ? levothyroxine 100 MCG tablet TAKE 1 TABLET DAILY   ? loratadine (CLARITIN) 10 MG tablet Take daily.  ? meclizine (ANTIVERT) 25 MG tablet Take  1 tablet 3 x /day  Only if needed   ? rOPINIRole (REQUIP) 1 MG tablet Take  1/2 to 1 tablet  3 x /day  as needed   ? vitamin B-12 100 MCG tablet Take  daily.   ? vitamin C 500 MG tablet Take daily.  ? ? ? ?Allergies  ?Allergen Reactions  ? Ppd [Tuberculin Purified Protein Derivative] Other (See Comments)  ?  indurated  ? ? ? ?PMHx:   ?Past Medical History:  ?Diagnosis Date  ? Anemia, unspecified   ? Arthritis   ? "knees; right shoulder" (09/15/2013)  ? Basal cell carcinoma   ? "right upper outer lip"  ? DM neuropathy, type II diabetes mellitus ([place])   ? Dyspnea 2021  ? with low iron  ? GERD (gastroesophageal reflux disease)   ? Gout   ? High cholesterol   ? Hypertension   ? Hypothyroidism   ? Meniere's disease   ? Obesity (BMI 30-39.9)   ? Other forms of epilepsy and recurrent seizures without  mention of intractable epilepsy 11/04/2012  ? Non convulsive paroxysmal spells, responding to [facility], patient not driving.   ? Pneumonia ~ 1943  ? Skin cancer   ? "forehead; right hand"  ? Stage 4 chronic kidney disease ([place])   ? UTI (urinary tract infection)   ? Vitamin D deficiency   ? ? ? ? ? ? ?Past Surgical History:  ?Procedure Laterality Date  ? BIOPSY  04/05/2020  ? Procedure: BIOPSY;  Surgeon: Jerene Bears, MD;  Location: [facility];  Service: Gastroenterology;;  ? CERVICAL POLYPECTOMY    ? COLONOSCOPY WITH PROPOFOL N/A 04/06/2020  ? Procedure: COLONOSCOPY WITH PROPOFOL;  Surgeon: Jerene Bears, MD;  Location: [facility];  Service: Gastroenterology;  Laterality: N/A;  ? ESOPHAGOGASTRODUODENOSCOPY (EGD) WITH PROPOFOL N/A 04/05/2020  ? Procedure: ESOPHAGOGASTRODUODENOSCOPY (EGD) WITH PROPOFOL;  Surgeon: Jerene Bears, MD;  [facility]: [facility];  Service: Gastroenterology;  Laterality: N/A;  ? HAMMER TOE SURGERY Left 1980's  ? MOHS SURGERY  [address]  ? "right upper outer lip"  ? POLYPECTOMY  04/06/2020  ? Procedure: POLYPECTOMY;  Surgeon: Jerene Bears, MD;  Location: [facility];  Service: Gastroenterology;;  ? TONSILLECTOMY  ~ 1947  ? WRIST SURGERY Left ~ 1950  ? "ran arm thru window"  ? ? ?FHx:    Reviewed / unchanged ? ?SHx:    Reviewed / unchanged  ? ?Systems Review: ? ?Constitutional: Denies fever, chills, wt changes, headaches, insomnia, fatigue, night sweats, change in appetite. ?Eyes: Denies redness, blurred vision, diplopia, discharge, itchy, watery eyes.  ?ENT: Denies discharge, congestion, post nasal drip, epistaxis, sore throat, earache, hearing loss, dental pain, tinnitus, vertigo, sinus pain, snoring.  ?CV: Denies chest pain, palpitations, irregular heartbeat, syncope, dyspnea, diaphoresis, orthopnea, PND, claudication or edema. ?Respiratory: denies cough, dyspnea, DOE, pleurisy, hoarseness, laryngitis, wheezing.  ?Gastrointestinal: Denies dysphagia, odynophagia, heartburn, reflux, water brash,  abdominal pain or cramps, nausea, vomiting, bloating, diarrhea, constipation, hematemesis, melena, hematochezia  or hemorrhoids. ?Genitourinary: Denies dysuria, frequency, urgency, nocturia, hesitancy, discharge, hematuria or flank pain. ?Musculoskeletal: Denies arthralgias, myalgias, stiffness, jt. swelling, pain, limping or strain/sprain.  ?Skin: Denies pruritus, rash, hives, warts, acne, eczema or change in skin lesion(s). ?Neuro: No weakness, tremor,  incoordination, spasms, paresthesia or pain. ?Psychiatric: Denies confusion, memory loss or sensory loss. ?Endo: Denies change in weight, skin or hair change.  ?Heme/Lymph: No excessive bleeding, bruising or enlarged lymph nodes. ? ?Physical Exam ? ?BP 122/78   Pulse 77   Temp 97.9 ?F (36.6 ?C)   Resp 16   Ht '5\' 2"'$  (1.575 m)   Wt 190 lb 9.6 oz (86.5 kg)   SpO2 94%   BMI 34.86 kg/m?  ? ?Appears  well nourished, well groomed  and in no distress. ? ?Eyes: PERRLA, EOMs, conjunctiva no swelling or erythema. ?Sinuses: No frontal/maxillary tenderness ?ENT/Mouth: EAC's clear, TM's nl w/o erythema, bulging. Nares clear w/o erythema, swelling, exudates. Oropharynx clear without erythema or exudates. Oral hygiene is good. Tongue normal, non obstructing. Hearing intact.  ?Neck: Supple. Thyroid not palpable. Car 2+/2+ without bruits, nodes or JVD. ?Chest: Respirations nl with BS clear & equal w/o rales, rhonchi, wheezing or stridor.  ?Cor: Heart sounds normal w/ regular rate and rhythm without sig. murmurs, gallops, clicks or rubs. Peripheral pulses normal and equal  without edema.  ?Abdomen: Soft & bowel sounds normal. Non-tender w/o guarding, rebound, hernias, masses or organomegaly.  ?Lymphatics: Unremarkable.  ?Musculoskeletal: Full ROM all peripheral extremities, joint stability, 5/5 strength and normal gait.  ?Skin: Warm, dry without exposed rashes, lesions or ecchymosis apparent.  ?Neuro: Cranial nerves intact, reflexes equal bilaterally. Sensory-motor testing  grossly intact. Tendon reflexes grossly intact.  ?Pysch: Alert & oriented x 3.  Insight and judgement nl & appropriate. No ideations. ? ?Assessment and Plan: ? ?1. Essential hypertension ? ?- Continue medication, m

## 2021-05-14 NOTE — Patient Instructions (Signed)

## 2021-05-15 LAB — COMPLETE METABOLIC PANEL WITH GFR
AG Ratio: 1.8 (calc) (ref 1.0–2.5)
ALT: 18 U/L (ref 6–29)
AST: 26 U/L (ref 10–35)
Albumin: 4.3 g/dL (ref 3.6–5.1)
Alkaline phosphatase (APISO): 66 U/L (ref 37–153)
BUN/Creatinine Ratio: 22 (calc) (ref 6–22)
BUN: 45 mg/dL — ABNORMAL HIGH (ref 7–25)
CO2: 21 mmol/L (ref 20–32)
Calcium: 10 mg/dL (ref 8.6–10.4)
Chloride: 116 mmol/L — ABNORMAL HIGH (ref 98–110)
Creat: 2.08 mg/dL — ABNORMAL HIGH (ref 0.60–0.95)
Globulin: 2.4 g/dL (calc) (ref 1.9–3.7)
Glucose, Bld: 115 mg/dL — ABNORMAL HIGH (ref 65–99)
Potassium: 5.3 mmol/L (ref 3.5–5.3)
Sodium: 143 mmol/L (ref 135–146)
Total Bilirubin: 0.5 mg/dL (ref 0.2–1.2)
Total Protein: 6.7 g/dL (ref 6.1–8.1)
eGFR: 23 mL/min/{1.73_m2} — ABNORMAL LOW (ref >=60)

## 2021-05-15 LAB — VITAMIN D 25 HYDROXY (VIT D DEFICIENCY, FRACTURES): Vit D, 25-Hydroxy: 32 ng/mL (ref 30–100)

## 2021-05-15 LAB — HEMOGLOBIN A1C
Hgb A1c MFr Bld: 6.1 % of total Hgb — ABNORMAL HIGH (ref <5.7)
Mean Plasma Glucose: 128 mg/dL
eAG (mmol/L): 7.1 mmol/L

## 2021-05-15 LAB — CBC WITH DIFFERENTIAL/PLATELET
Absolute Monocytes: 333 cells/uL (ref 200–950)
Basophils Absolute: 42 cells/uL (ref 0–200)
Basophils Relative: 0.8 %
Eosinophils Absolute: 551 cells/uL — ABNORMAL HIGH (ref 15–500)
Eosinophils Relative: 10.6 %
HCT: 36.9 % (ref 35.0–45.0)
Hemoglobin: 11 g/dL — ABNORMAL LOW (ref 11.7–15.5)
Lymphs Abs: 686 cells/uL — ABNORMAL LOW (ref 850–3900)
MCH: 25.6 pg — ABNORMAL LOW (ref 27.0–33.0)
MCHC: 29.8 g/dL — ABNORMAL LOW (ref 32.0–36.0)
MCV: 85.8 fL (ref 80.0–100.0)
MPV: 9.8 fL (ref 7.5–12.5)
Monocytes Relative: 6.4 %
Neutro Abs: 3588 cells/uL (ref 1500–7800)
Neutrophils Relative %: 69 %
Platelets: 319 10*3/uL (ref 140–400)
RBC: 4.3 10*6/uL (ref 3.80–5.10)
RDW: 19.3 % — ABNORMAL HIGH (ref 11.0–15.0)
Total Lymphocyte: 13.2 %
WBC: 5.2 10*3/uL (ref 3.8–10.8)

## 2021-05-15 LAB — TSH: TSH: 3.3 mIU/L (ref 0.40–4.50)

## 2021-05-15 LAB — LIPID PANEL
Cholesterol: 93 mg/dL (ref <200)
HDL: 29 mg/dL — ABNORMAL LOW (ref >=50)
LDL Cholesterol (Calc): 51 mg/dL (calc)
Non-HDL Cholesterol (Calc): 64 mg/dL (calc) (ref <130)
Total CHOL/HDL Ratio: 3.2 (calc) (ref <5.0)
Triglycerides: 58 mg/dL (ref <150)

## 2021-05-15 LAB — MAGNESIUM: Magnesium: 2.4 mg/dL (ref 1.5–2.5)

## 2021-05-15 LAB — URIC ACID: Uric Acid, Serum: 5.5 mg/dL (ref 2.5–7.0)

## 2021-05-15 LAB — LEVETIRACETAM, IMMUNOASSAY: LEVETIRACETAM, IMMUNOASSAY: 56.9 ug/mL — ABNORMAL HIGH (ref 6.0–46.0)

## 2021-05-15 NOTE — Progress Notes (Signed)
<><><><><><><><><><><><><><><><><><><><><><><><><><><><><><><><><> ?<><><><><><><><><><><><><><><><><><><><><><><><><><><><><><><><><> ? ?-    CBC shows mild anemia is stable  ?<><><><><><><><><><><><><><><><><><><><><><><><><><><><><><><><><> ?<><><><><><><><><><><><><><><><><><><><><><><><><><><><><><><><><> ? ?-  Keppra is a little elevated,  ?So recommend decrease your 500 mg dose down  ?                                                                        from 3 x /day to just take 2 x/ day  ?<><><><><><><><><><><><><><><><><><><><><><><><><><><><><><><><><> ?<><><><><><><><><><><><><><><><><><><><><><><><><><><><><><><><><> ? ?-  Your Vitamin   D level  =   32 is very low   ? ?-  Vitamin D goal is between 70-100.  ? ?- Please increase your Vitamin D 1,000 units /day up to  ?                                                                          3 capsules = 3,000 units  /day !  ? ?- It is very important as a natural anti-inflammatory and helping the  ?immune system protect against viral infections, like the Covid-19  ? ? ?helping hair, skin, and nails, as well as reducing stroke and  ?heart attack risk.  ? ?- It helps your bones and helps with mood. ? ?- It also decreases numerous cancer risks so please  ?take it as directed.  ? ?- Low Vit D is associated with a 200-300% higher risk for  ?CANCER  ? ?and 200-300% higher risk for HEART   ATTACK  &  STROKE.   ? ?- It is also associated with higher death rate at younger ages,  ? ?autoimmune diseases like Rheumatoid arthritis, Lupus,  ?Multiple Sclerosis.    ? ?- Also many other serious conditions, like depression, Alzheimer's ? ?Dementia, infertility, muscle aches, fatigue, fibromyalgia  ? ?- just to name a few. ?<><><><><><><><><><><><><><><><><><><><><><><><><><><><><><><><><> ?<><><><><><><><><><><><><><><><><><><><><><><><><><><><><><><><><> ? ?-  Uric acid /Gout test   is Normal, So please continue your Allopurinol Same   ?<><><><><><><><><><><><><><><><><><><><><><><><><><><><><><><><><> ?<><><><><><><><><><><><><><><><><><><><><><><><><><><><><><><><><> ? ?-  Total Chol = 93   & LDL Chol = 51  - Excellent  ? ?<><><><><><><><><><><><><><><><><><><><><><><><><><><><><><><><><> ?<><><><><><><><><><><><><><><><><><><><><><><><><><><><><><><><><> ? ?-  Kidney functions are about the same & Stable ?<><><><><><><><><><><><><><><><><><><><><><><><><><><><><><><><><> ?<><><><><><><><><><><><><><><><><><><><><><><><><><><><><><><><><> ? ? ? ? ? ? ? ? ? ? ? ? ? ? ? ? ? ? ?

## 2021-06-04 ENCOUNTER — Telehealth: Payer: Self-pay

## 2021-06-04 NOTE — Telephone Encounter (Signed)
LM-06/04/21-Called pt. at [phone] to confirm CPP visit for 4/18 at 9:00AM. Unable to reach pt. [place]. ? ?Total time spent: 2 min. ?

## 2021-06-05 ENCOUNTER — Ambulatory Visit: Payer: Medicare Other | Admitting: Pharmacy Technician

## 2021-06-05 ENCOUNTER — Other Ambulatory Visit: Payer: Self-pay

## 2021-06-05 DIAGNOSIS — E7800 Pure hypercholesterolemia, unspecified: Secondary | ICD-10-CM

## 2021-06-05 DIAGNOSIS — E669 Obesity, unspecified: Secondary | ICD-10-CM

## 2021-06-05 DIAGNOSIS — N184 Chronic kidney disease, stage 4 (severe): Secondary | ICD-10-CM

## 2021-06-05 DIAGNOSIS — E1169 Type 2 diabetes mellitus with other specified complication: Secondary | ICD-10-CM

## 2021-06-05 DIAGNOSIS — Z789 Other specified health status: Secondary | ICD-10-CM

## 2021-06-05 DIAGNOSIS — E1149 Type 2 diabetes mellitus with other diabetic neurological complication: Secondary | ICD-10-CM

## 2021-06-05 DIAGNOSIS — K219 Gastro-esophageal reflux disease without esophagitis: Secondary | ICD-10-CM

## 2021-06-05 DIAGNOSIS — E1122 Type 2 diabetes mellitus with diabetic chronic kidney disease: Secondary | ICD-10-CM

## 2021-06-05 NOTE — Progress Notes (Signed)
Follow Up Pharmacist Visit  ? ?[name],[name]  [id number] ?58 years, Female  DOB: Jul 05, 1932  M: [phone] ?Care Team: Levy Pupa ?__________________________________________________ ?Patient's Chronic Conditions: Gastroesophageal Reflux Disease (GERD), Hypertension (HTN), Hypothyroidism, Hyperlipidemia/Dyslipidemia (HLD), Diabetes (DM), Chronic Kidney Disease (CKD), Anemia, Other, Gout ?List Other Conditions (separated by comma): Vitamin D deficiency, Venous insufficiency, Aortic valve thickening, OSA, Colonic polyps, GI bleed, Melena, Benign neoplasm of cecum, Secondary hyperparathyroidism or renal origin, Epilepsy, Vertigo, Morbid obesity ? ?Summary for PCP: ?1. Consider ACEi/ARB for Microalb/SCR ratio and CKD. ?2. Monitor Keppra level to make sure in therapeutic range after decrease of dose to BID. ?3. Counseled to increase fluid intake.  ?4. May benefit from SGLT2 for diabetes and CKD.  ?5. Draw Microalb/SCr  ?Marland Kitchen ?Doctor and Hospital Visits ?Were there PCP Visits since last visit with the Pharmacist?: Yes ?Visit #1: 02/07/21-[name] Demetra Shiner, NP- Pt. presented for Medicare AWV. ?MODIFIED ? ?Allopurinol 100 mg Oral 1 tab daily TO 1 tab BID ? ?Visit #2: 05/14/21-Dr. Melford [name]- Pt. presented for 6 month f/u. No medication changes noted. ?Were there Specialist Visits since last visit with the Pharmacist?: Yes ?Visit #1: 12/04/20-Duda, Illene Regulus, MD (Orthopedic Surgery)-Pt. presented for f/u for right foot ulcer. No medication changes noted. ?Visit #2: 01/01/21-Duda, Illene Regulus, MD (Orthopedic Surgery)- Pt. presented for f/u. No medication changes noted. ?Visit #3: 01/30/21-Duda, Illene Regulus, MD (Orthopedic Surgery)- Pt. presented for right foot f/u. No medication changes noted. ?Visit #4: 02/21/21-Kale, Cloria Spring, MD (Oncology)- Pt. presented for f/u for JAK2 positive myeloproliferative neoplasm. No medication changes noted. ?Visit #5: 02/26/21-Duda, Illene Regulus, MD (Orthopedic Surgery)- Pt.  presented for Right Foot - Follow-up. No medication changes noted. ?Visit #6: 04/23/21-Duda, Illene Regulus, MD (Orthopedic Surgery)- Pt. presented for Right Foot - Follow-up. No medication changes noted. ?Was there a Hospital Visit in last 30 days?: No ?Were there other Hospital Visits since last visit with the Pharmacist?: No ?. ?Medication Information ?Have there been any medication changes from PCP or Specialist since last visit with the Pharmacist?: Yes ?Details: MODIFIED ? ?Allopurinol 100 mg Oral 1 tab daily TO 1 tab BID ? ?Are there any Medication adherence gaps (beyond 5 days past due)?: No ?Medication adherence rates for the STAR rating drugs: Pt. is not taking any star rating drugs. ?List Patient's current Care Gaps: No current Care Gaps identified ?Marland Kitchen ?Pre-Call Questions (HC) ?Are you able to connect with Patient: No ?Were you able to leave a message?: Yes ?Relevant details: LM-06/04/21-Called pt. at [phone] to confirm CPP visit for 4/18 at 9:00AM. Unable to reach pt. [name] [name]. ?. ?Clinical Summary ?Patient Risk: Moderate ?Next CCM Follow Up: October ?Next AWV: 08/20/21 ?Next PCP Visit: 11/05/21 ?Summary for PCP: 1. Consider ACEi/ARB for Microalb/SCR ratio and CKD. ?2. Monitor Keppra level to make sure in therapeutic range after decrease of dose to BID. ?3. Counseled to increase fluid intake. ?4. May benefit from SGLT2 for diabetes and CKD. ?5. Draw Microalb/SCr ?Marland Kitchen ?Subjective Information ?Current BP: 122/78 ?Current HR: 77 ?taken on: 05/14/2021 ?Weight: 190 ?BMI: 34.86 ?Last GFR: 23 ?taken on: 05/14/2021 ?Visit Completed on: 06/05/2021 ?Why did the patient present?: CCM Follow up ?Lifestyle habits such as diet and exercise?: Some walking. Uses a walker ?Alcohol, tobacco, and illicit drug usage?: None ?SDOH: Accountable Health Communities Health-Related Social Needs Screening Tool ?([email or web address]) ?SDOH questions were documented and reviewed (EMR or  [employer]) within the past 3 months?: No ?What is your living situation today? (ref #1): I have a steady place to  live ?Think about the place you live. Do you have problems with any of the following? (ref #2): None of the above ?Within the past 12 months, you worried that your food would run out before you got money to buy more (ref #3): Never true ?Within the past 12 months, the food you bought just didn't last and you didn't have money to get more (ref #4): Never true ?In the past 12 months, has lack of reliable transportation kept you from medical appointments, meetings, work or from getting things needed for daily living? (ref #5): No ?In the past 12 months, has the electric, gas, oil, or water company threatened to shut off services in your home? (ref #6): No ?How often does anyone, including family and friends, physically hurt you? (ref #7): Never (1) ?How often does anyone, including family and friends, insult or talk down to you? (ref #8): Never (1) ?How often does anyone, including friends and family, threaten you with harm? (ref #9): Never (1) ?How often does anyone, including family and friends, scream or curse at you? (ref #10): Never (1) ?. ?Hypertension (HTN) ?Discussed with patient today?: Yes ?Is patient able to obtain BP reading today?: No ?Goal: <130/80 mmHG ?Hypertension Stage: Elevated (SBP: 120-129 and DBP < 80) ?Is Patient checking BP at home?: No ?How often does patient miss taking their blood pressure medications?: None ?Has patient experienced hypotension, dizziness, falls or bradycardia?: No ?Check present secondary causes (below) for HTN: Obesity, CKD ?Does Patient use RPM device?: No ?BP RPM device: Does patient qualify?: No ?We discussed: DASH diet:  following a diet emphasizing fruits and vegetables and low-fat dairy products along with whole grains, fish, poultry, and nuts. Reducing red meats and sugars., Recommend using a salt substitute to replace your salt if you need flavor.,  Weight reduction- We discussed losing 5-10% of body weight., Proper Home BP Measurement, Hypertension pathophysiology, complications, treatment goals, and management with patient for 10-15 minutes, Increasing movement, Contacting PCP office for signs and symptoms of high or low blood pressure (hypotension, dizziness, falls, headaches, edema) ?Assessment:: Controlled ?Drug: Atenolol '50mg'$  daily ?Assessment: Appropriate, Effective, Safe, Accessible ?Plan to Start: Consider ACEi/ARB for CKD ?Plan to Order: Consider Microalb/SCR ?. ?Hyperlipidemia/Dyslipidemia (HLD) ?Last Lipid panel on: 05/14/2021 ?TC (Goal<200): 93 ?LDL: 51 ?HDL (Goal>40): 29 ?TG (Goal<150): 58 ?ASCVD 10-year risk?is:: N/A due to Age > 30 ?Discussed with patient today?: No ?Drug: Omega 3 '1000mg'$  daily ?Assessment: Appropriate, Effective, Safe, Accessible ?Marland Kitchen ?Diabetes (DM) ?Current A1C: 6.1 ?taken on: 05/14/2021 ?Type: 2 ?The current microalbumin ratio is: 140 ?tested on: 11/03/2020 ?Last DM Foot Exam on: 03/16/2018 ?Results: normal ?Last DM Eye Exam on: 01/22/2021 ?Results: normal ?Results: unknown ?Discussed with patient today?: Yes ?Goal A1C: < 7.0 % ?Type: 2 ?Is Patient taking statin medication: No ?Reason patient is not taking statin(s): Controlled on diet and omega 3 ?Is patient taking ACEi / ARB?: No ?Does Patient use RPM device?: No ?DM RPM device: Does patient qualify?: No ?Patient checking Blood Sugar at home?: Yes ?Does patient use a Continuous Glucose Monitoring (CGM) device?: No ?How often does patient check Blood Sugars?: Twice daily ?Recent fasting Blood sugar reading: 143 ?Has patient experienced any hypoglycemic episodes?: No ?Diet recall discussed: Yes ?Breakfast: Cereal ?Lunch: Does not eat lunch ?Supper: Goes to [employer] at [employer] ?Beverages: 1 cup decaf coffee, no soda, water ?We discussed: How to recognize and treat signs of hypoglycemia., Low carbohydrate eating plan with an emphasis on whole grains,  legumes, nuts, fruits, and  vegetables and minimal refined and processed foods., Proper diabetic foot care including daily foot exams and wearing closed toe shoes., Healthy eating habits for DM with patient for 15-20 minut

## 2021-06-06 ENCOUNTER — Other Ambulatory Visit: Payer: Self-pay | Admitting: Internal Medicine

## 2021-06-07 ENCOUNTER — Telehealth: Payer: Self-pay | Admitting: Hematology

## 2021-06-07 NOTE — Telephone Encounter (Signed)
Per provider reschedule called and left message with appointment details.  Call back number left if changes are needed ?

## 2021-06-07 NOTE — Telephone Encounter (Signed)
.  Called patient to schedule appointment per 4/20 inbasket, patient is aware of date and time.   ?

## 2021-06-17 DIAGNOSIS — E1122 Type 2 diabetes mellitus with diabetic chronic kidney disease: Secondary | ICD-10-CM

## 2021-06-17 DIAGNOSIS — N184 Chronic kidney disease, stage 4 (severe): Secondary | ICD-10-CM

## 2021-06-17 DIAGNOSIS — E7800 Pure hypercholesterolemia, unspecified: Secondary | ICD-10-CM

## 2021-06-22 ENCOUNTER — Inpatient Hospital Stay: Payer: Medicare Other

## 2021-06-22 ENCOUNTER — Inpatient Hospital Stay: Payer: Medicare Other | Admitting: Hematology

## 2021-06-25 ENCOUNTER — Other Ambulatory Visit: Payer: Self-pay | Admitting: Nurse Practitioner

## 2021-06-25 ENCOUNTER — Ambulatory Visit: Payer: Medicare Other | Admitting: Orthopedic Surgery

## 2021-06-25 DIAGNOSIS — E1122 Type 2 diabetes mellitus with diabetic chronic kidney disease: Secondary | ICD-10-CM

## 2021-06-26 ENCOUNTER — Other Ambulatory Visit: Payer: Self-pay

## 2021-06-26 DIAGNOSIS — D471 Chronic myeloproliferative disease: Secondary | ICD-10-CM

## 2021-06-27 ENCOUNTER — Ambulatory Visit: Payer: Medicare Other | Admitting: Hematology

## 2021-06-27 ENCOUNTER — Other Ambulatory Visit: Payer: Medicare Other

## 2021-07-02 ENCOUNTER — Ambulatory Visit: Payer: Medicare Other | Admitting: Orthopedic Surgery

## 2021-07-02 ENCOUNTER — Encounter: Payer: Self-pay | Admitting: Orthopedic Surgery

## 2021-07-02 DIAGNOSIS — L97411 Non-pressure chronic ulcer of right heel and midfoot limited to breakdown of skin: Secondary | ICD-10-CM

## 2021-07-02 DIAGNOSIS — L03031 Cellulitis of right toe: Secondary | ICD-10-CM | POA: Diagnosis not present

## 2021-07-02 MED ORDER — DOXYCYCLINE HYCLATE 100 MG PO TABS: 100.0000 mg | ORAL_TABLET | Freq: Two times a day (BID) | ORAL | 0 refills | Status: DC

## 2021-07-02 NOTE — Progress Notes (Signed)
? ?Office Visit Note ?  ?Patient: [name] [name]           ?Date of Birth: 1933-02-06           ?MRN: [id number] ?Visit Date: 07/02/2021 ?             ?Requested by: Unk Pinto, MD ?[address] ?[address] ?[place],  [place] [address] ?PCP: Unk Pinto, MD ? ?Chief Complaint  ?Patient presents with  ? Right Foot - Follow-up  ?  Heel ulcer  ? ? ? ? ?HPI: ?Patient is an 88 year old woman who is seen for cellulitis of the right foot third toe.  Patient states she was trimming her nails and clipped her foot.  Patient states she has been having swelling and redness of the third toe. ? ?Assessment & Plan: ?Visit Diagnoses:  ?1. Non-pressure chronic ulcer of right heel and midfoot limited to breakdown of skin ([place])   ?2. Cellulitis of third toe of right foot   ? ? ?Plan: We will start her on doxycycline.  Clinically she does not have an abscess or bony infection.  Plan for three-view radiographs of the right foot at follow-up. ? ?Follow-Up Instructions: Return in about 2 weeks (around 07/16/2021).  ? ?Ortho Exam ? ?Patient is alert, oriented, no adenopathy, well-dressed, normal affect, normal respiratory effort. ?Examination patient has a palpable dorsalis pedis pulse.  There is no redness or cellulitis in the foot.  She does have redness and swelling of the third toe.  After informed consent the ulcer beneath the third toe was debrided with a 10 blade knife back to healthy viable granulation tissue this was touched with silver nitrate there was no abscess no exposed bone after debridement the ulcer was 10 mm in diameter 1 mm deep.  The callus on the heel was pared and there was good realization the ulcer is completely healed on the right heel. ? ?Imaging: ?No results found. ?No images are attached to the encounter. ? ?Labs: ?Lab Results  ?Component Value Date  ? HGBA1C 6.1 (H) 05/14/2021  ? HGBA1C 5.8 (H) 02/07/2021  ? HGBA1C 6.6 (H) 11/03/2020  ? ESRSEDRATE 9 09/29/2019  ? ESRSEDRATE 0 05/03/2019  ?  ESRSEDRATE 1 05/02/2014  ? CRP 14.7 (H) 09/29/2019  ? LABURIC 5.5 05/14/2021  ? LABURIC 8.9 (H) 11/03/2020  ? LABURIC 8.7 (H) 05/05/2020  ? REPTSTATUS 09/07/2019 FINAL 09/04/2019  ? CULT >=100,000 COLONIES/mL ESCHERICHIA COLI (A) 09/04/2019  ? LABORGA ESCHERICHIA COLI (A) 09/04/2019  ? ? ? ?Lab Results  ?Component Value Date  ? ALBUMIN 4.1 10/20/2020  ? ALBUMIN 4.0 06/21/2020  ? ALBUMIN 4.2 04/24/2020  ? ? ?Lab Results  ?Component Value Date  ? MG 2.4 05/14/2021  ? MG 2.2 11/03/2020  ? MG 2.4 05/05/2020  ? ?Lab Results  ?Component Value Date  ? VD25OH 32 05/14/2021  ? VD25OH 45 11/03/2020  ? VD25OH 49 05/05/2020  ? ? ?No results found for: PREALBUMIN ? ?  Latest Ref Rng & Units 05/14/2021  ? 10:29 AM 02/21/2021  ?  9:29 AM 02/07/2021  ? 12:41 PM  ?CBC EXTENDED  ?WBC 3.8 - 10.8 Thousand/uL 5.2   6.0   5.5    ?RBC 3.80 - 5.10 Million/uL 4.30   3.96   3.91    ?Hemoglobin 11.7 - 15.5 g/dL 11.0   10.3   10.3    ?HCT 35.0 - 45.0 % 36.9   35.0   34.1    ?Platelets 140 - 400 Thousand/uL 319  334   340    ?NEUT# 1,500 - 7,800 cells/uL 3,588    3,729    ?Lymph# 850 - 3,900 cells/uL 686    721    ? ? ? ?There is no height or weight on file to calculate BMI. ? ?Orders:  ?No orders of the defined types were placed in this encounter. ? ?No orders of the defined types were placed in this encounter. ? ? ? Procedures: ?No procedures performed ? ?Clinical Data: ?No additional findings. ? ?ROS: ? ?All other systems negative, except as noted in the HPI. ?Review of Systems ? ?Objective: ?Vital Signs: There were no vitals taken for this visit. ? ?Specialty Comments:  ?No specialty comments available. ? ?PMFS History: ?Patient Active Problem List  ? Diagnosis Date Noted  ? Benign neoplasm of cecum   ? Benign neoplasm of rectum   ? Blood in stool   ? Melena   ? Acute blood loss anemia   ? GI bleed 04/04/2020  ? Symptomatic anemia 11/02/2019  ? DM neuropathy, type II diabetes mellitus ([place])   ? Stage 4 chronic kidney disease ([place])   ? Obesity  (BMI 30-39.9)   ? High cholesterol   ? Aortic valve thickening by Cardiac echo in 2015 11/01/2019  ? Iron deficiency anemia 05/10/2019  ? Secondary hyperparathyroidism of renal origin ([place]) 09/20/2018  ? Type 2 diabetes mellitus with stage 4 chronic kidney disease, with long-term current use of insulin ([place]) 12/08/2017  ? ACE inhibitor intolerance 12/08/2017  ? Vertigo of central origin 11/20/2015  ? Morbid obesity ([place]) BMI 35+ with T2DM, htn, hyperlipidemia, CKD, OSA 12/12/2014  ? OSA on CPAP 08/08/2014  ? Type II diabetes mellitus with neurological manifestations ([place]) 09/15/2013  ? Hyperlipidemia associated with type 2 diabetes mellitus ([place]) 06/27/2013  ? Vitamin D deficiency 06/27/2013  ? Seizure disorder ([place]) 11/04/2012  ? Epilepsy ([place]) 11/04/2012  ? Anemia 06/13/2008  ? COLONIC POLYPS 06/09/2008  ? Hypothyroidism 06/09/2008  ? CKD stage 4 due to type 2 diabetes mellitus ([place]) 06/09/2008  ? Venous (peripheral) insufficiency 06/09/2008  ? Gastroesophageal reflux disease 06/09/2008  ? Essential hypertension 06/09/2008  ? ?Past Medical History:  ?Diagnosis Date  ? Anemia, unspecified   ? Arthritis   ? "knees; right shoulder" (09/15/2013)  ? Basal cell carcinoma   ? "right upper outer lip"  ? DM neuropathy, type II diabetes mellitus ([place])   ? Dyspnea 2021  ? with low iron  ? GERD (gastroesophageal reflux disease)   ? Gout   ? High cholesterol   ? Hypertension   ? Hypothyroidism   ? Meniere's disease   ? Obesity (BMI 30-39.9)   ? Other forms of epilepsy and recurrent seizures without mention of intractable epilepsy 11/04/2012  ? Non convulsive paroxysmal spells, responding to [facility], patient not driving.   ? Pneumonia ~ 1943  ? Skin cancer   ? "forehead; right hand"  ? Stage 4 chronic kidney disease ([place])   ? UTI (urinary tract infection)   ? Vitamin D deficiency   ?  ?Family History  ?Problem Relation Age of Onset  ? Heart disease Mother   ? Stroke Mother   ? Heart disease Father   ? Ovarian cancer Sister   ?  Hypertension Son   ? Hypertension Son   ? Diabetes Son   ? Alcohol abuse Son   ?  ?Past Surgical History:  ?Procedure Laterality Date  ? BIOPSY  04/05/2020  ? Procedure: BIOPSY;  Surgeon: Zenovia Jarred  Jerilynn Mages, MD;  Location: [facility];  Service: Gastroenterology;;  ? CERVICAL POLYPECTOMY    ? COLONOSCOPY WITH PROPOFOL N/A 04/06/2020  ? Procedure: COLONOSCOPY WITH PROPOFOL;  Surgeon: Jerene Bears, MD;  Location: [facility];  Service: Gastroenterology;  Laterality: N/A;  ? ESOPHAGOGASTRODUODENOSCOPY (EGD) WITH PROPOFOL N/A 04/05/2020  ? Procedure: ESOPHAGOGASTRODUODENOSCOPY (EGD) WITH PROPOFOL;  Surgeon: Jerene Bears, MD;  Location: [facility];  Service: Gastroenterology;  Laterality: N/A;  ? HAMMER TOE SURGERY Left 1980's  ? MOHS SURGERY  [id number]  ? "right upper outer lip"  ? POLYPECTOMY  04/06/2020  ? Procedure: POLYPECTOMY;  Surgeon: Jerene Bears, MD;  Location: [facility];  Service: Gastroenterology;;  ? TONSILLECTOMY  ~ 1947  ? WRIST SURGERY Left ~ 1950  ? "ran arm thru window"  ? ?Social History  ? ?Occupational History  ? Occupation: retired  ?Tobacco Use  ? Smoking status: Former  ?  Packs/day: 1.00  ?  Years: 30.00  ?  Pack years: 30.00  ?  Types: Cigarettes  ?  Quit date: 06/29/1983  ?  Years since quitting: 38.0  ? Smokeless tobacco: Never  ?Vaping Use  ? Vaping Use: Never used  ?Substance and Sexual Activity  ? Alcohol use: Yes  ?  Alcohol/week: 0.0 standard drinks  ?  Comment: 09/15/2013 "glass of wine maybe once/year"  ? Drug use: No  ? Sexual activity: Not Currently  ? ? ? ? ? ?

## 2021-07-04 ENCOUNTER — Other Ambulatory Visit: Payer: Self-pay

## 2021-07-04 ENCOUNTER — Inpatient Hospital Stay: Payer: Medicare Other | Attending: Hematology

## 2021-07-04 ENCOUNTER — Inpatient Hospital Stay: Payer: Medicare Other | Admitting: Hematology

## 2021-07-04 VITALS — BP 130/46 | HR 73 | Temp 97.3°F | Resp 18 | Wt 182.8 lb

## 2021-07-04 DIAGNOSIS — D469 Myelodysplastic syndrome, unspecified: Secondary | ICD-10-CM | POA: Insufficient documentation

## 2021-07-04 DIAGNOSIS — D471 Chronic myeloproliferative disease: Secondary | ICD-10-CM

## 2021-07-04 DIAGNOSIS — Z1589 Genetic susceptibility to other disease: Secondary | ICD-10-CM | POA: Diagnosis not present

## 2021-07-04 DIAGNOSIS — N189 Chronic kidney disease, unspecified: Secondary | ICD-10-CM | POA: Diagnosis not present

## 2021-07-04 LAB — CMP (CANCER CENTER ONLY)
ALT: 11 U/L (ref 0–44)
AST: 17 U/L (ref 15–41)
Albumin: 4.3 g/dL (ref 3.5–5.0)
Alkaline Phosphatase: 57 U/L (ref 38–126)
Anion gap: 8 (ref 5–15)
BUN: 51 mg/dL — ABNORMAL HIGH (ref 8–23)
CO2: 22 mmol/L (ref 22–32)
Calcium: 9.6 mg/dL (ref 8.9–10.3)
Chloride: 110 mmol/L (ref 98–111)
Creatinine: 2.33 mg/dL — ABNORMAL HIGH (ref 0.44–1.00)
GFR, Estimated: 20 mL/min — ABNORMAL LOW (ref >60)
Glucose, Bld: 177 mg/dL — ABNORMAL HIGH (ref 70–99)
Potassium: 4.6 mmol/L (ref 3.5–5.1)
Sodium: 140 mmol/L (ref 135–145)
Total Bilirubin: 0.5 mg/dL (ref 0.3–1.2)
Total Protein: 6.9 g/dL (ref 6.5–8.1)

## 2021-07-04 LAB — CBC WITH DIFFERENTIAL (CANCER CENTER ONLY)
Abs Immature Granulocytes: 0.02 10*3/uL (ref 0.00–0.07)
Basophils Absolute: 0.1 10*3/uL (ref 0.0–0.1)
Basophils Relative: 1 %
Eosinophils Absolute: 0.8 10*3/uL — ABNORMAL HIGH (ref 0.0–0.5)
Eosinophils Relative: 13 %
HCT: 39 % (ref 36.0–46.0)
Hemoglobin: 11.5 g/dL — ABNORMAL LOW (ref 12.0–15.0)
Immature Granulocytes: 0 %
Lymphocytes Relative: 13 %
Lymphs Abs: 0.8 10*3/uL (ref 0.7–4.0)
MCH: 25 pg — ABNORMAL LOW (ref 26.0–34.0)
MCHC: 29.5 g/dL — ABNORMAL LOW (ref 30.0–36.0)
MCV: 84.8 fL (ref 80.0–100.0)
Monocytes Absolute: 0.3 10*3/uL (ref 0.1–1.0)
Monocytes Relative: 5 %
Neutro Abs: 4.3 10*3/uL (ref 1.7–7.7)
Neutrophils Relative %: 68 %
Platelet Count: 350 10*3/uL (ref 150–400)
RBC: 4.6 MIL/uL (ref 3.87–5.11)
RDW: 22 % — ABNORMAL HIGH (ref 11.5–15.5)
WBC Count: 6.3 10*3/uL (ref 4.0–10.5)
nRBC: 0 % (ref 0.0–0.2)

## 2021-07-04 LAB — FERRITIN: Ferritin: 32 ng/mL (ref 11–307)

## 2021-07-04 LAB — IRON AND IRON BINDING CAPACITY (CC-WL,HP ONLY)
Iron: 58 ug/dL (ref 28–170)
Saturation Ratios: 19 % (ref 10.4–31.8)
TIBC: 305 ug/dL (ref 250–450)
UIBC: 247 ug/dL (ref 148–442)

## 2021-07-04 NOTE — Progress Notes (Signed)
HEMATOLOGY/ONCOLOGY CLINIC NOTE  Date of Service: 07/04/2021   Patient Care Team: Unk Pinto, MD as PCP - General (Internal Medicine) Rozetta Nunnery, MD (Inactive) as Consulting Physician (Otolaryngology) Teena Irani, MD (Inactive) as Consulting Physician (Gastroenterology) Marcy Panning, MD as Consulting Physician (Oncology) Claudia Desanctis, MD as Consulting Physician (Internal Medicine) Rush Landmark, [facility] as Pharmacist (Pharmacist)  CHIEF COMPLAINTS/PURPOSE OF CONSULTATION:  Follow-up for JAK2 positive myeloproliferative neoplasm  HISTORY OF PRESENTING ILLNESS:  Please see previous note for details on initial presentation  INTERVAL HISTORY:   [name] [name] is a 88 y.o. female here for follow-up of her JAK2 positive myeloproliferative neoplasm. She reports She is doing well with no new symptoms or concerns.  She reports a recent fall within the last 4-6 months. She notes some hip pain.  She reports some recurrent vertigo that has happened for years. We discussed trying Epley maneuvers which she says she has tried and says has worked in the past.   She notes that she experiences dizziness and does not pass out when having a seizure.  She notes a recent infection in her right toe that she is currently taking Doxycycline for.   No black or bloody stools. No fevers no chills no night sweats. No new bone pains. No new abdominal pain or distention. No other new or acute focal symptoms.  Labs done today were reviewed with the patient. CBC WNL with minor progressive improvement in counts. CMP shows elevated bld glucose of 177, elevated creatinine of 2.33, BUN of 51 and decreased GFR est of 20 which are consistent with chronic kidney disease. Iron saturation is 19%. Ferritin at 32.  MEDICAL HISTORY:  Past Medical History:  Diagnosis Date   Anemia, unspecified    Arthritis    "knees; right shoulder" (09/15/2013)   Basal cell carcinoma    "right upper outer  lip"   DM neuropathy, type II diabetes mellitus ([place])    Dyspnea 2021   with low iron   GERD (gastroesophageal reflux disease)    Gout    High cholesterol    Hypertension    Hypothyroidism    Meniere's disease    Obesity (BMI 30-39.9)    Other forms of epilepsy and recurrent seizures without mention of intractable epilepsy 11/04/2012   Non convulsive paroxysmal spells, responding to [facility], patient not driving.    Pneumonia ~ 1943   Skin cancer    "forehead; right hand"   Stage 4 chronic kidney disease ([place])    UTI (urinary tract infection)    Vitamin D deficiency     SURGICAL HISTORY: Past Surgical History:  Procedure Laterality Date   BIOPSY  04/05/2020   Procedure: BIOPSY;  Surgeon: Jerene Bears, MD;  [facility]: [facility];  Service: Gastroenterology;;   CERVICAL POLYPECTOMY     COLONOSCOPY WITH PROPOFOL N/A 04/06/2020   Procedure: COLONOSCOPY WITH PROPOFOL;  Surgeon: Jerene Bears, MD;  Location: [facility];  Service: Gastroenterology;  Laterality: N/A;   ESOPHAGOGASTRODUODENOSCOPY (EGD) WITH PROPOFOL N/A 04/05/2020   Procedure: ESOPHAGOGASTRODUODENOSCOPY (EGD) WITH PROPOFOL;  Surgeon: Jerene Bears, MD;  [facility];  Service: Gastroenterology;  Laterality: N/A;   HAMMER TOE SURGERY Left 1980's   MOHS SURGERY  20087   "right upper outer lip"   POLYPECTOMY  04/06/2020   Procedure: POLYPECTOMY;  Surgeon: Jerene Bears, MD;  [facility]: [facility];  Service: Gastroenterology;;   TONSILLECTOMY  ~ 1947   WRIST SURGERY Left ~ 1950   "ran arm  thru window"    SOCIAL HISTORY: Social History   Socioeconomic History   Marital status: Widowed    Spouse name: Not on file   Number of children: 3   Years of education: 69   Highest education level: Not on file  Occupational History   Occupation: retired  Tobacco Use   Smoking status: Former    Packs/day: 1.00    Years: 30.00    Pack years: 30.00    Types: Cigarettes    Quit date: 06/29/1983    Years since  quitting: 38.0   Smokeless tobacco: Never  Vaping Use   Vaping Use: Never used  Substance and Sexual Activity   Alcohol use: Yes    Alcohol/week: 0.0 standard drinks    Comment: 09/15/2013 "glass of wine maybe once/year"   Drug use: No   Sexual activity: Not Currently  Other Topics Concern   Not on file  Social History Narrative   Patient lives at home alone and she is widowed.   Retired.   Education some college.   Right handed.   Caffeine sometimes not daily.    Social Determinants of Radio broadcast assistant Strain: Not on file  Food Insecurity: No Food Insecurity   Worried About Charity fundraiser in the Last Year: Never true   Arboriculturist in the Last Year: Never true  Transportation Needs: Not on file  Physical Activity: Not on file  Stress: Not on file  Social Connections: Not on file  Intimate Partner Violence: Not on file    FAMILY HISTORY: Family History  Problem Relation Age of Onset   Heart disease Mother    Stroke Mother    Heart disease Father    Ovarian cancer Sister    Hypertension Son    Hypertension Son    Diabetes Son    Alcohol abuse Son     ALLERGIES:  is allergic to ppd [tuberculin purified protein derivative].  MEDICATIONS:  Current Outpatient Medications  Medication Sig Dispense Refill   doxycycline (VIBRA-TABS) 100 MG tablet Take 1 tablet (100 mg total) by mouth 2 (two) times daily. 60 tablet 0   acetaminophen (TYLENOL) 500 MG tablet Take 1,000 mg by mouth every 6 (six) hours as needed for moderate pain.     allopurinol (ZYLOPRIM) 100 MG tablet Take 1 tablet (100 mg total) by mouth 2 (two) times daily. Take  1 tablet  Daily  to prevent Gout. 180 tablet 2   aspirin 81 MG tablet Take 81 mg by mouth daily.     atenolol (TENORMIN) 50 MG tablet TAKE ONE TABLET BY MOUTH DAILY GENERIC EQUIVALENT FOR TENORMIN 90 tablet 2   Blood Glucose Monitoring Suppl (ONETOUCH VERIO) w/Device KIT Check  Blood Sugar   4 x /day before Meals & Bedtime   ((  Dx-  e11.22 )) 1 kit 1   cholecalciferol (VITAMIN D3) 25 MCG (1000 UNIT) tablet Take 1,000 Units by mouth daily.     fish oil-omega-3 fatty acids 1000 MG capsule Take 1,000 mg by mouth at bedtime.     furosemide (LASIX) 40 MG tablet TAKE 1 TABLET BY MOUTH DAILY FOR FLUID RETENTION AND ANKLE SWELLING 90 tablet 3   glucose blood (ONETOUCH VERIO) test strip Check Blood Sugar 4 x /day  (( Dx-  e11.22 )) 300 each 3   HUMULIN 70/30 (70-30) 100 UNIT/ML injection INJECT 20 UNITS UNDER THE SKIN TWICE DAILY WITH MEALS 30 mL 3   hydrocortisone (ANUSOL-HC) 2.5 %  rectal cream Place rectally 2 (two) times daily. 30 g 0   Lancets (ONETOUCH ULTRASOFT) lancets Check Blood Sugar  4 x /day  before Meals & Bedtime   (( Dx-  e11.22 )) 400 each 3   lansoprazole (PREVACID) 30 MG capsule TAKE 1 CAPSULE BY MOUTH DAILY TO PREVENT HEARTBURN AND INDIGESTION. 90 capsule 3   levETIRAcetam (KEPPRA) 500 MG tablet Take  1 tablet  3 x /day  with Meals  to Prevent Seizures 270 tablet 3   levothyroxine (SYNTHROID) 100 MCG tablet TAKE 1 TABLET BY MOUTH DAILY ON AN EMPTY STOMACH WITH ONLY WATER FOR 30 MINUTES. NO ANTACIDS, CALCIUM OR MAGNESIUM FOR 4 HOURS. AVOID BIOTIN 90 tablet 3   loratadine (CLARITIN) 10 MG tablet Take 10 mg by mouth daily.     meclizine (ANTIVERT) 25 MG tablet TAKE 1 TABLET BY MOUTH THREE TIMES DAILY ONLY IF NEEDED FOR DIZZINESS OR VERTIGO 90 tablet 1   rOPINIRole (REQUIP) 1 MG tablet Take  1/2 to 1 tablet  3 x /day  as needed for Restless Legs & Cramps / Patient knows to take by mouth 270 tablet 3   vitamin B-12 (CYANOCOBALAMIN) 100 MCG tablet Take 500 mcg by mouth daily.     vitamin C (ASCORBIC ACID) 500 MG tablet Take 500 mg by mouth daily.     No current facility-administered medications for this visit.    REVIEW OF SYSTEMS:   .10 Point review of Systems was done is negative except as noted above.  PHYSICAL EXAMINATION: ECOG PERFORMANCE STATUS: 2 - Symptomatic, <50% confined to bed  Vitals:   07/04/21  0938  BP: (!) 130/46  Pulse: 73  Resp: 18  Temp: (!) 97.3 F (36.3 C)  SpO2: 93%    Filed Weights   07/04/21 0938  Weight: 182 lb 12.8 oz (82.9 kg)    .Body mass index is 33.43 kg/m.  NAD GENERAL:alert, in no acute distress and comfortable SKIN: no acute rashes, no significant lesions EYES: conjunctiva are pink and non-injected, sclera anicteric NECK: supple, no JVD LYMPH:  no palpable lymphadenopathy in the cervical, axillary or inguinal regions LUNGS: clear to auscultation b/l with normal respiratory effort HEART: regular rate & rhythm ABDOMEN:  normoactive bowel sounds , non tender, not distended. No palpable hepato-splenomegaly. Extremity: no pedal edema PSYCH: alert & oriented x 3 with fluent speech NEURO: no focal motor/sensory deficits  LABORATORY DATA:  I have reviewed the data as listed  .    Latest Ref Rng & Units 07/04/2021    9:31 AM 05/14/2021   10:29 AM 02/21/2021    9:29 AM  CBC  WBC 4.0 - 10.5 K/uL 6.3   5.2   6.0    Hemoglobin 12.0 - 15.0 g/dL 11.5   11.0   10.3    Hematocrit 36.0 - 46.0 % 39.0   36.9   35.0    Platelets 150 - 400 K/uL 350   319   334     . CBC    Component Value Date/Time   WBC 6.3 07/04/2021 0931   WBC 5.2 05/14/2021 1029   RBC 4.60 07/04/2021 0931   HGB 11.5 (L) 07/04/2021 0931   HGB 11.5 (L) 10/05/2012 1111   HCT 39.0 07/04/2021 0931   HCT 34.2 (L) 10/05/2012 1111   PLT 350 07/04/2021 0931   PLT 258 10/05/2012 1111   MCV 84.8 07/04/2021 0931   MCV 90.1 10/05/2012 1111   MCH 25.0 (L) 07/04/2021 0931   MCHC 29.5 (  L) 07/04/2021 0931   RDW 22.0 (H) 07/04/2021 0931   RDW 14.2 10/05/2012 1111   LYMPHSABS 0.8 07/04/2021 0931   LYMPHSABS 1.4 10/05/2012 1111   MONOABS 0.3 07/04/2021 0931   MONOABS 0.5 10/05/2012 1111   EOSABS 0.8 (H) 07/04/2021 0931   EOSABS 0.6 (H) 10/05/2012 1111   BASOSABS 0.1 07/04/2021 0931   BASOSABS 0.0 10/05/2012 1111    .    Latest Ref Rng & Units 07/04/2021    9:31 AM 05/14/2021   10:29 AM  02/07/2021   12:41 PM  CMP  Glucose 70 - 99 mg/dL 177   115   116    BUN 8 - 23 mg/dL 51   45   43    Creatinine 0.44 - 1.00 mg/dL 2.33   2.08   2.05    Sodium 135 - 145 mmol/L 140   143   141    Potassium 3.5 - 5.1 mmol/L 4.6   5.3   5.3    Chloride 98 - 111 mmol/L 110   116   109    CO2 22 - 32 mmol/L '22   21   23    ' Calcium 8.9 - 10.3 mg/dL 9.6   10.0   9.8    Total Protein 6.5 - 8.1 g/dL 6.9   6.7   6.7    Total Bilirubin 0.3 - 1.2 mg/dL 0.5   0.5   0.5    Alkaline Phos 38 - 126 U/L 57      AST 15 - 41 U/L '17   26   15    ' ALT 0 - 44 U/L '11   18   9     ' . Lab Results  Component Value Date   IRON 58 07/04/2021   TIBC 305 07/04/2021   IRONPCTSAT 19 07/04/2021   (Iron and TIBC)  Lab Results  Component Value Date   FERRITIN 32 07/04/2021   11/26/2019   08/10/2019 Iliac Crest Bone Marrow Report [id number]):   08/10/2019 Flow Pathology [id number]):   08/10/2019 Cytogenetics:    RADIOGRAPHIC STUDIES: I have personally reviewed the radiological images as listed and agreed with the findings in the report. No results found.   ASSESSMENT & PLAN:   88 yo with   1) Normocytic Anemia  Likely related to CKD and functional iron deficiency Hgb improved to 11.5  2) JAK2 positive MPN - likely ET -- platelets have starting increasing recently.  3) BM Bx- consistent with MPN NOS  PLAN: -Labs done today were reviewed with the patient. CBC WNL with minor progressive improvement in counts. Hemoglobin of 11.5 normal WBC count of 6.3k and platelets of 350k. CMP shows elevated bld glucose of 177, elevated creatinine of 2.33, BUN of 51 and decreased GFR est of 20 which are consistent with chronic kidney disease. Iron saturation is 19%. Ferritin at 32 -No evidence of polycythemia or thrombocytosis related to her JAK2 mutation at this time. -Her CKD related anemia might be balancing out any concerning for polycythemia at this time. -We recommend continuing to hold off  on iron replacement at this time. -No indication for starting hydroxyurea or any other treatment for the patient's JAK2 positive MPN at this time. -Patient is on baby aspirin which we would recommend to continue. -Will see back in 6 months with labs.   FOLLOW UP: Return to clinic with Dr. Irene Limbo with labs in 6 months  .The total time spent in the appointment was 20 minutes* .  All of the patient's questions were answered with apparent satisfaction. The patient knows to call the clinic with any problems, questions or concerns.   Sullivan Lone MD MS AAHIVMS [facility] Hematology/Oncology Physician [facility]  .*Total Encounter Time as defined by the Centers for Medicare and Medicaid Services includes, in addition to the face-to-face time of a patient visit (documented in the note above) non-face-to-face time: obtaining and reviewing outside history, ordering and reviewing medications, tests or procedures, care coordination (communications with other health care professionals or caregivers) and documentation in the medical record.   I, Melene Muller, am acting as scribe for Dr. Sullivan Lone, MD. .I have reviewed the above documentation for accuracy and completeness, and I agree with the above. Brunetta Genera MD

## 2021-07-09 ENCOUNTER — Encounter (HOSPITAL_COMMUNITY): Payer: Self-pay

## 2021-07-09 ENCOUNTER — Encounter: Payer: Self-pay | Admitting: Hematology

## 2021-07-10 ENCOUNTER — Other Ambulatory Visit: Payer: Self-pay | Admitting: Internal Medicine

## 2021-07-10 DIAGNOSIS — R42 Dizziness and giddiness: Secondary | ICD-10-CM

## 2021-07-10 DIAGNOSIS — G40909 Epilepsy, unspecified, not intractable, without status epilepticus: Secondary | ICD-10-CM

## 2021-07-10 MED ORDER — LEVETIRACETAM 500 MG PO TABS: ORAL_TABLET | ORAL | 3 refills | Status: DC

## 2021-07-10 MED ORDER — MECLIZINE HCL 25 MG PO TABS: ORAL_TABLET | ORAL | 1 refills | Status: DC

## 2021-07-18 ENCOUNTER — Ambulatory Visit: Payer: Self-pay

## 2021-07-18 ENCOUNTER — Ambulatory Visit: Payer: Medicare Other | Admitting: Family

## 2021-07-18 ENCOUNTER — Encounter: Payer: Self-pay | Admitting: Family

## 2021-07-18 DIAGNOSIS — L97411 Non-pressure chronic ulcer of right heel and midfoot limited to breakdown of skin: Secondary | ICD-10-CM

## 2021-07-18 NOTE — Progress Notes (Signed)
Office Visit Note   Patient: [name] [name]           Date of Birth: [birth year]           MRN: [id number] Visit Date: 07/18/2021              Requested by: Unk Pinto, [address],  [place] [id number] PCP: Unk Pinto, MD  Chief Complaint  Patient presents with   Right Foot - Wound Check      HPI: Patient is an 88 year old woman who is seen in follow up for cellulitis of the right foot third toe.  Patient states she was trimming her nails and clipped her foot.  Patient states she had been having swelling and redness of the third toe.  She is quite pleased with progress. Has not been using dressings. No new symptoms.  Assessment & Plan: Visit Diagnoses:  No diagnosis found.   Plan: radiographs reassuring. Continue close monitoring. Follow up with dr. Sharol Given in 4 weeks. Follow-Up Instructions: No follow-ups on file.   Ortho Exam  Patient is alert, oriented, no adenopathy, well-dressed, normal affect, normal respiratory effort. Examination patient has a palpable dorsalis pedis pulse.  There is no redness or cellulitis in the foot.  She does have resolving cellulitis of the third toe.  After informed consent the ulcer beneath the third toe and heel were debrided with a 10 blade knife back to healthy viable granulation tissue.  There was no abscess no exposed bone after debridement the ulcer was 10 mm in diameter 1 mm deep.  The callus on the heel was pared and there was good epithelialization of the ulcer is completely healed on the right heel.  Imaging: No results found. No images are attached to the encounter.  Labs: Lab Results  Component Value Date   HGBA1C 6.1 (H) 05/14/2021   HGBA1C 5.8 (H) 02/07/2021   HGBA1C 6.6 (H) 11/03/2020   ESRSEDRATE 9 09/29/2019   ESRSEDRATE 0 05/03/2019   ESRSEDRATE 1 05/02/2014   CRP 14.7 (H) 09/29/2019   LABURIC 5.5 05/14/2021   LABURIC 8.9 (H) 11/03/2020   LABURIC 8.7 (H) 05/05/2020    REPTSTATUS 09/07/2019 FINAL 09/04/2019   CULT >=100,000 COLONIES/mL ESCHERICHIA COLI (A) 09/04/2019   LABORGA ESCHERICHIA COLI (A) 09/04/2019     Lab Results  Component Value Date   ALBUMIN 4.3 07/04/2021   ALBUMIN 4.1 10/20/2020   ALBUMIN 4.0 06/21/2020    Lab Results  Component Value Date   MG 2.4 05/14/2021   MG 2.2 11/03/2020   MG 2.4 05/05/2020   Lab Results  Component Value Date   VD25OH 32 05/14/2021   VD25OH 45 11/03/2020   VD25OH 49 05/05/2020    No results found for: PREALBUMIN    Latest Ref Rng & Units 07/04/2021    9:31 AM 05/14/2021   10:29 AM 02/21/2021    9:29 AM  CBC EXTENDED  WBC 4.0 - 10.5 K/uL 6.3   5.2   6.0    RBC 3.87 - 5.11 MIL/uL 4.60   4.30   3.96    Hemoglobin 12.0 - 15.0 g/dL 11.5   11.0   10.3    HCT 36.0 - 46.0 % 39.0   36.9   35.0    Platelets 150 - 400 K/uL 350   319   334    NEUT# 1.7 - 7.7 K/uL 4.3   3,588     Lymph# 0.7 - 4.0 K/uL 0.8   686  There is no height or weight on file to calculate BMI.  Orders:  No orders of the defined types were placed in this encounter.  No orders of the defined types were placed in this encounter.    Procedures: No procedures performed  Clinical Data: No additional findings.  ROS:  All other systems negative, except as noted in the HPI. Review of Systems  Objective: Vital Signs: There were no vitals taken for this visit.  Specialty Comments:  No specialty comments available.  PMFS History: Patient Active Problem List   Diagnosis Date Noted   Benign neoplasm of cecum    Benign neoplasm of rectum    Blood in stool    Melena    Acute blood loss anemia    GI bleed 04/04/2020   Symptomatic anemia 11/02/2019   DM neuropathy, type II diabetes mellitus (HCC)    Stage 4 chronic kidney disease (HCC)    Obesity (BMI 30-39.9)    High cholesterol    Aortic valve thickening by Cardiac echo in 2015 11/01/2019   Iron deficiency anemia 05/10/2019   Secondary hyperparathyroidism of  renal origin ([place]) 09/20/2018   Type 2 diabetes mellitus with stage 4 chronic kidney disease, with long-term current use of insulin ([place]) 12/08/2017   ACE inhibitor intolerance 12/08/2017   Vertigo of central origin 11/20/2015   Morbid obesity ([place]) BMI 35+ with T2DM, htn, hyperlipidemia, CKD, OSA 12/12/2014   OSA on CPAP 08/08/2014   Type II diabetes mellitus with neurological manifestations ([place]) 09/15/2013   Hyperlipidemia associated with type 2 diabetes mellitus ([place]) 06/27/2013   Vitamin D deficiency 06/27/2013   Seizure disorder ([place]) 11/04/2012   Epilepsy ([place]) 11/04/2012   Anemia 06/13/2008   COLONIC POLYPS 06/09/2008   Hypothyroidism 06/09/2008   CKD stage 4 due to type 2 diabetes mellitus ([place]) 06/09/2008   Venous (peripheral) insufficiency 06/09/2008   Gastroesophageal reflux disease 06/09/2008   Essential hypertension 06/09/2008   Past Medical History:  Diagnosis Date   Anemia, unspecified    Arthritis    "knees; right shoulder" (09/15/2013)   Basal cell carcinoma    "right upper outer lip"   DM neuropathy, type II diabetes mellitus ([place])    Dyspnea 2021   with low iron   GERD (gastroesophageal reflux disease)    Gout    High cholesterol    Hypertension    Hypothyroidism    Meniere's disease    Obesity (BMI 30-39.9)    Other forms of epilepsy and recurrent seizures without mention of intractable epilepsy 11/04/2012   Non convulsive paroxysmal spells, responding to [facility], patient not driving.    Pneumonia ~ 1943   Skin cancer    "forehead; right hand"   Stage 4 chronic kidney disease ([place])    UTI (urinary tract infection)    Vitamin D deficiency     Family History  Problem Relation Age of Onset   Heart disease Mother    Stroke Mother    Heart disease Father    Ovarian cancer Sister    Hypertension Son    Hypertension Son    Diabetes Son    Alcohol abuse Son     Past Surgical History:  Procedure Laterality Date   BIOPSY  04/05/2020   Procedure:  BIOPSY;  Surgeon: Jerene Bears, MD;  Location: [facility];  Service: Gastroenterology;;   CERVICAL POLYPECTOMY     COLONOSCOPY WITH PROPOFOL N/A 04/06/2020   Procedure: COLONOSCOPY WITH PROPOFOL;  Surgeon: Jerene Bears, MD;  Location:  [facility];  Service: Gastroenterology;  Laterality: N/A;   ESOPHAGOGASTRODUODENOSCOPY (EGD) WITH PROPOFOL N/A 04/05/2020   Procedure: ESOPHAGOGASTRODUODENOSCOPY (EGD) WITH PROPOFOL;  Surgeon: Jerene Bears, MD;  Location: [facility];  Service: Gastroenterology;  Laterality: N/A;   HAMMER TOE SURGERY Left 1980's   MOHS SURGERY  [address]87   "right upper outer lip"   POLYPECTOMY  04/06/2020   Procedure: POLYPECTOMY;  Surgeon: Jerene Bears, MD;  [facility]: [facility];  Service: Gastroenterology;;   TONSILLECTOMY  ~ [place] Left ~ 1950   "ran arm thru window"   Social History   Occupational History   Occupation: retired  Tobacco Use   Smoking status: Former    Packs/day: 1.00    Years: 30.00    Pack years: 30.00    Types: Cigarettes    Quit date: 06/29/1983    Years since quitting: 38.0   Smokeless tobacco: Never  Vaping Use   Vaping Use: Never used  Substance and Sexual Activity   Alcohol use: Yes    Alcohol/week: 0.0 standard drinks    Comment: 09/15/2013 "glass of wine maybe once/year"   Drug use: No   Sexual activity: Not Currently

## 2021-08-02 ENCOUNTER — Emergency Department (HOSPITAL_BASED_OUTPATIENT_CLINIC_OR_DEPARTMENT_OTHER)
Admission: EM | Admit: 2021-08-02 | Discharge: 2021-08-03 | Disposition: A | Discharge status: Home / Self Care | Payer: Medicare Other | Attending: Emergency Medicine | Admitting: Emergency Medicine

## 2021-08-02 ENCOUNTER — Emergency Department (HOSPITAL_BASED_OUTPATIENT_CLINIC_OR_DEPARTMENT_OTHER): Payer: Medicare Other

## 2021-08-02 ENCOUNTER — Emergency Department (HOSPITAL_BASED_OUTPATIENT_CLINIC_OR_DEPARTMENT_OTHER): Payer: Medicare Other | Admitting: Radiology

## 2021-08-02 ENCOUNTER — Other Ambulatory Visit: Payer: Self-pay

## 2021-08-02 DIAGNOSIS — K0889 Other specified disorders of teeth and supporting structures: Secondary | ICD-10-CM | POA: Diagnosis not present

## 2021-08-02 DIAGNOSIS — L97521 Non-pressure chronic ulcer of other part of left foot limited to breakdown of skin: Secondary | ICD-10-CM

## 2021-08-02 DIAGNOSIS — E039 Hypothyroidism, unspecified: Secondary | ICD-10-CM | POA: Diagnosis not present

## 2021-08-02 DIAGNOSIS — W010XXA Fall on same level from slipping, tripping and stumbling without subsequent striking against object, initial encounter: Secondary | ICD-10-CM | POA: Diagnosis not present

## 2021-08-02 DIAGNOSIS — Z7982 Long term (current) use of aspirin: Secondary | ICD-10-CM | POA: Insufficient documentation

## 2021-08-02 DIAGNOSIS — S8002XA Contusion of left knee, initial encounter: Secondary | ICD-10-CM | POA: Diagnosis not present

## 2021-08-02 DIAGNOSIS — I129 Hypertensive chronic kidney disease with stage 1 through stage 4 chronic kidney disease, or unspecified chronic kidney disease: Secondary | ICD-10-CM | POA: Insufficient documentation

## 2021-08-02 DIAGNOSIS — S8992XA Unspecified injury of left lower leg, initial encounter: Secondary | ICD-10-CM | POA: Diagnosis present

## 2021-08-02 DIAGNOSIS — Z85828 Personal history of other malignant neoplasm of skin: Secondary | ICD-10-CM | POA: Insufficient documentation

## 2021-08-02 DIAGNOSIS — S8001XA Contusion of right knee, initial encounter: Secondary | ICD-10-CM | POA: Diagnosis not present

## 2021-08-02 DIAGNOSIS — N184 Chronic kidney disease, stage 4 (severe): Secondary | ICD-10-CM | POA: Insufficient documentation

## 2021-08-02 DIAGNOSIS — S098XXA Other specified injuries of head, initial encounter: Secondary | ICD-10-CM | POA: Insufficient documentation

## 2021-08-02 DIAGNOSIS — L97529 Non-pressure chronic ulcer of other part of left foot with unspecified severity: Secondary | ICD-10-CM | POA: Diagnosis not present

## 2021-08-02 DIAGNOSIS — S00531A Contusion of lip, initial encounter: Secondary | ICD-10-CM | POA: Diagnosis not present

## 2021-08-02 DIAGNOSIS — C4491 Basal cell carcinoma of skin, unspecified: Secondary | ICD-10-CM | POA: Diagnosis not present

## 2021-08-02 DIAGNOSIS — W19XXXA Unspecified fall, initial encounter: Secondary | ICD-10-CM

## 2021-08-02 DIAGNOSIS — Z79899 Other long term (current) drug therapy: Secondary | ICD-10-CM | POA: Diagnosis not present

## 2021-08-02 DIAGNOSIS — E11621 Type 2 diabetes mellitus with foot ulcer: Secondary | ICD-10-CM | POA: Insufficient documentation

## 2021-08-02 DIAGNOSIS — S0990XA Unspecified injury of head, initial encounter: Secondary | ICD-10-CM | POA: Diagnosis present

## 2021-08-02 LAB — COMPREHENSIVE METABOLIC PANEL
ALT: 11 U/L (ref 0–44)
AST: 20 U/L (ref 15–41)
Albumin: 3.4 g/dL — ABNORMAL LOW (ref 3.5–5.0)
Alkaline Phosphatase: 69 U/L (ref 38–126)
Anion gap: 8 (ref 5–15)
BUN: 68 mg/dL — ABNORMAL HIGH (ref 8–23)
CO2: 21 mmol/L — ABNORMAL LOW (ref 22–32)
Calcium: 9.9 mg/dL (ref 8.9–10.3)
Chloride: 112 mmol/L — ABNORMAL HIGH (ref 98–111)
Creatinine, Ser: 2.6 mg/dL — ABNORMAL HIGH (ref 0.44–1.00)
GFR, Estimated: 17 mL/min — ABNORMAL LOW (ref >60)
Glucose, Bld: 141 mg/dL — ABNORMAL HIGH (ref 70–99)
Potassium: 4.7 mmol/L (ref 3.5–5.1)
Sodium: 141 mmol/L (ref 135–145)
Total Bilirubin: 0.4 mg/dL (ref 0.3–1.2)
Total Protein: 5.8 g/dL — ABNORMAL LOW (ref 6.5–8.1)

## 2021-08-02 LAB — CBC
HCT: 38.3 % (ref 36.0–46.0)
Hemoglobin: 11.3 g/dL — ABNORMAL LOW (ref 12.0–15.0)
MCH: 24.8 pg — ABNORMAL LOW (ref 26.0–34.0)
MCHC: 29.5 g/dL — ABNORMAL LOW (ref 30.0–36.0)
MCV: 84.2 fL (ref 80.0–100.0)
Platelets: 327 10*3/uL (ref 150–400)
RBC: 4.55 MIL/uL (ref 3.87–5.11)
RDW: 22.4 % — ABNORMAL HIGH (ref 11.5–15.5)
WBC: 6.8 10*3/uL (ref 4.0–10.5)
nRBC: 0 % (ref 0.0–0.2)

## 2021-08-02 MED ORDER — SODIUM CHLORIDE 0.9 % IV BOLUS
500.0000 mL | Freq: Once | INTRAVENOUS | Status: AC
  Administered 2021-08-02: 500 mL via INTRAVENOUS

## 2021-08-02 MED ORDER — OXYCODONE HCL 5 MG PO TABS
5.0000 mg | ORAL_TABLET | Freq: Once | ORAL | Status: AC
  Administered 2021-08-02: 5 mg via ORAL
  Filled 2021-08-02: qty 1

## 2021-08-02 MED ORDER — SODIUM CHLORIDE 0.9 % IV BOLUS: 1000.0000 mL | Freq: Once | INTRAVENOUS | Status: DC

## 2021-08-02 MED ORDER — ACETAMINOPHEN 325 MG PO TABS
650.0000 mg | ORAL_TABLET | Freq: Once | ORAL | Status: AC
  Administered 2021-08-02: 650 mg via ORAL
  Filled 2021-08-02: qty 2

## 2021-08-02 NOTE — ED Notes (Signed)
CT scans and Xrays completed; pt in room lying in bed awake and alert; no acute changes noted - family at bedside

## 2021-08-02 NOTE — ED Triage Notes (Signed)
Pt BIB [facility] from [facility] after sustaining a mechanical fall. Pt states she tripped and feel forwards. C/o pain bilateral knees, right hip, and pain to her top teeth. GCS 15. No blood thinner.

## 2021-08-02 NOTE — ED Provider Notes (Signed)
[facility] DEPT Provider Note   CSN: [id number] Arrival date & time: 08/02/21  1943     History  Chief Complaint  Patient presents with   [name] [name] is a 88 y.o. female.   Fall Pertinent negatives include no chest pain, no abdominal pain, no headaches and no shortness of breath.  88 year old female presents emergency department after a mechanical fall.  Patient states that she was walking after dinner when the floor transitioned between carpet and hardwood's when her foot caught on the floor and she fell striking bilateral knee and face .  She denies any symptoms prior to fall.  She denies loss of consciousness after the fall, blood in use, nausea, vomiting, changes in vision, weakness, sensory deficits.  She was able to get up from the fall with assistance and ambulate minimally.  She is currently complaining of left ankle pain, bilateral knee pain, right-sided hip pain, pain to left upper tooth, lip.  She noted bleeding from her nose after the fall that stopped spontaneously without intervention.  Denies history to bilateral knee/hip.  Does note she takes a daily baby aspirin for osteoarthritic pain.  Past Medical History:  Diagnosis Date   Anemia, unspecified    Arthritis    "knees; right shoulder" (09/15/2013)   Basal cell carcinoma    "right upper outer lip"   DM neuropathy, type II diabetes mellitus ([place])    Dyspnea 2021   with low iron   GERD (gastroesophageal reflux disease)    Gout    High cholesterol    Hypertension    Hypothyroidism    Meniere's disease    Obesity (BMI 30-39.9)    Other forms of epilepsy and recurrent seizures without mention of intractable epilepsy 11/04/2012   Non convulsive paroxysmal spells, responding to [facility], patient not driving.    Pneumonia ~ 1943   Skin cancer    "forehead; right hand"   Stage 4 chronic kidney disease ([place])    UTI (urinary tract infection)    Vitamin D deficiency    Past  Surgical History:  Procedure Laterality Date   BIOPSY  04/05/2020   Procedure: BIOPSY;  Surgeon: Jerene Bears, MD;  Location: [facility];  Service: Gastroenterology;;   CERVICAL POLYPECTOMY     COLONOSCOPY WITH PROPOFOL N/A 04/06/2020   Procedure: COLONOSCOPY WITH PROPOFOL;  Surgeon: Jerene Bears, MD;  Location: [facility];  Service: Gastroenterology;  Laterality: N/A;   ESOPHAGOGASTRODUODENOSCOPY (EGD) WITH PROPOFOL N/A 04/05/2020   Procedure: ESOPHAGOGASTRODUODENOSCOPY (EGD) WITH PROPOFOL;  Surgeon: Jerene Bears, MD;  Location: [facility];  Service: Gastroenterology;  Laterality: N/A;   HAMMER TOE SURGERY Left 1980's   MOHS SURGERY  20087   "right upper outer lip"   POLYPECTOMY  04/06/2020   Procedure: POLYPECTOMY;  Surgeon: Jerene Bears, MD;  Location: [facility];  Service: Gastroenterology;;   TONSILLECTOMY  ~ 1947   WRIST SURGERY Left ~ 1950   "ran arm thru window"     Home Medications Prior to Admission medications   Medication Sig Start Date End Date Taking? Authorizing Provider  acetaminophen (TYLENOL) 500 MG tablet Take 1,000 mg by mouth every 6 (six) hours as needed for moderate pain.    [provider]  allopurinol (ZYLOPRIM) 100 MG tablet Take 1 tablet (100 mg total) by mouth 2 (two) times daily. Take  1 tablet  Daily  to prevent Gout. 02/07/21   Alycia Rossetti, NP  aspirin 81 MG tablet  Take 81 mg by mouth daily.    [provider]  atenolol (TENORMIN) 50 MG tablet TAKE ONE TABLET BY MOUTH DAILY GENERIC EQUIVALENT FOR TENORMIN 06/06/21   Darrol Jump, NP  BD VEO INSULIN SYRINGE U/F 31G X 15/64" 0.3 ML MISC 2 (two) times daily. 06/18/21   [provider]  Blood Glucose Monitoring Suppl (ONETOUCH VERIO) w/Device KIT Check  Blood Sugar   4 x /day before Meals & Bedtime   (( Dx-  e11.22 )) 11/03/20   Unk Pinto, MD  cholecalciferol (VITAMIN D3) 25 MCG (1000 UNIT) tablet Take 3,000 Units by mouth daily.    [provider]   doxycycline (VIBRA-TABS) 100 MG tablet Take 1 tablet (100 mg total) by mouth 2 (two) times daily. 07/02/21   Newt Minion, MD  fish oil-omega-3 fatty acids 1000 MG capsule Take 1,000 mg by mouth at bedtime.    [provider]  furosemide (LASIX) 40 MG tablet TAKE 1 TABLET BY MOUTH DAILY FOR FLUID RETENTION AND ANKLE SWELLING 03/21/21   Alycia Rossetti, NP  glucose blood (ONETOUCH VERIO) test strip Check Blood Sugar 4 x /day  (( Dx-  e11.22 )) 11/03/20   Unk Pinto, MD  HUMULIN 70/30 (70-30) 100 UNIT/ML injection INJECT 20 UNITS UNDER THE SKIN TWICE DAILY WITH MEALS 06/25/21   Alycia Rossetti, NP  hydrocortisone (ANUSOL-HC) 2.5 % rectal cream Place rectally 2 (two) times daily. 04/06/20   Kayleen Memos, DO  Lancets [facility] ULTRASOFT) lancets Check Blood Sugar  4 x /day  before Meals & Bedtime   (( Dx-  e11.22 )) 11/03/20   Unk Pinto, MD  lansoprazole (PREVACID) 30 MG capsule TAKE 1 CAPSULE BY MOUTH DAILY TO PREVENT HEARTBURN AND INDIGESTION. 12/19/20   Alycia Rossetti, NP  levETIRAcetam (KEPPRA) 500 MG tablet Take  1 tablet 2  x /day  with Meals  to Prevent Seizures 07/10/21   Unk Pinto, MD  levothyroxine (SYNTHROID) 100 MCG tablet TAKE 1 TABLET BY MOUTH DAILY ON AN EMPTY STOMACH WITH ONLY WATER FOR 30 MINUTES. NO ANTACIDS, CALCIUM OR MAGNESIUM FOR 4 HOURS. AVOID BIOTIN 11/29/20   Alycia Rossetti, NP  loratadine (CLARITIN) 10 MG tablet Take 10 mg by mouth daily.    [provider]  meclizine (ANTIVERT) 25 MG tablet Take   1 tablet 2 to 3 x /day  ONLY  if needed for Dizziness Bradly Bienenstock 07/10/21   Unk Pinto, MD  rOPINIRole (REQUIP) 1 MG tablet Take  1/2 to 1 tablet  3 x /day  as needed for Restless Legs & Cramps / Patient knows to take by mouth 10/17/20   Unk Pinto, MD  vitamin B-12 (CYANOCOBALAMIN) 100 MCG tablet Take 500 mcg by mouth daily.    [provider]  vitamin C (ASCORBIC ACID) 500 MG tablet Take 500 mg by mouth daily.    [provider]      Allergies    Ppd [tuberculin purified protein derivative]    Review of Systems   Review of Systems  Constitutional:  Negative for chills and fever.  HENT:  Negative for ear pain and sore throat.   Eyes:  Negative for pain and visual disturbance.  Respiratory:  Negative for cough and shortness of breath.   Cardiovascular:  Negative for chest pain and palpitations.  Gastrointestinal:  Negative for abdominal pain and vomiting.  Genitourinary:  Negative for dysuria and hematuria.  Musculoskeletal:  Positive for joint swelling. Negative for back pain.  Bilateral knee pain with swelling, left foot pain with swelling.  Right hip pain.  Left upper lip pain.  Loose left upper tooth.  Skin:  Negative for rash.       Ecchymosis noted on bilateral knee.  Plantar ulcer noted on the left foot at the first metatarsal head.  Minimal surrounding erythema.  Neurological:  Negative for seizures, syncope, light-headedness, numbness and headaches.  All other systems reviewed and are negative.   Physical Exam Updated Vital Signs BP (!) 145/45   Pulse 79   Temp (!) 97.5 F (36.4 C) (Oral)   Resp 15   SpO2 97%  Physical Exam Vitals and nursing note reviewed.  Constitutional:      General: She is not in acute distress.    Appearance: Normal appearance. She is well-developed.  HENT:     Head: Normocephalic and atraumatic.     Ears:     Comments: No hemotympanums, ecchymosis of mastoid bone left posterior ear, septal hematoma recognized.    Mouth/Throat:     Mouth: Mucous membranes are moist.     Pharynx: Oropharynx is clear.      Comments: Hematoma noted on left upper lip -no laceration or puncture wound appreciated.  Dislocated tooth noted in area above in yellow but still in proper alignment.  Remaining dentition in good repair with no tenderness.  No trismus noted on exam.  Mild tender to palpation over left maxillary area just superior to where the lip was noted to  have hematoma.  No tenderness to palpation along the mandible or remaining face. Eyes:     General: No visual field deficit.    Extraocular Movements: Extraocular movements intact.     Conjunctiva/sclera: Conjunctivae normal.     Pupils: Pupils are equal, round, and reactive to light.  Neck:     Comments: Denies midline tenderness of C, L, T-spine. Cardiovascular:     Rate and Rhythm: Normal rate and regular rhythm.     Pulses: Normal pulses.     Heart sounds: Murmur heard.     Comments: Systolic murmur noted at right upper sternal border as well as lateral fourth intercostal space. Pulmonary:     Effort: Pulmonary effort is normal. No respiratory distress.     Breath sounds: Normal breath sounds.  Abdominal:     Palpations: Abdomen is soft.     Tenderness: There is no abdominal tenderness. There is no guarding or rebound.  Musculoskeletal:        General: No swelling.     Cervical back: Neck supple. No tenderness.     Comments: No tenderness to palpation of upper extremities.  No sensory deficits along major nerve distributions of upper and lower extremities.  Radial and posterior tibial pulses full and intact bilaterally.  Bilateral knees swollen and ecchymotic with right greater than left.  Patient able to bend knees but right knee flexion is limited secondary to pain.  Patient is tender to palpation over bilateral knee.  Patient also elicits tenderness to palpation along second and third metatarsal of left foot.  Obvious ulceration noted to the plantar aspect of the left foot at the distal first metatarsal from a chronic wound.  Patient tender to palpation right hip.  Patient is full range of motion of bilateral ankles, hips.  Muscle strength 5 out of 5 for upper and lower extremities  Skin:    General: Skin is warm and dry.     Capillary Refill: Capillary refill takes less than 2  seconds.  Neurological:     General: No focal deficit present.     Mental Status: She is alert and  oriented to person, place, and time.     Cranial Nerves: No facial asymmetry.     Sensory: Sensation is intact.     Motor: Motor function is intact.     Coordination: Finger-Nose-Finger Test normal.     Comments: Cranial nerves III through XII grossly intact.  Patient able to move all 4 extremities without difficulty.  Psychiatric:        Mood and Affect: Mood normal.        Behavior: Behavior is cooperative.     ED Results / Procedures / Treatments   Labs (all labs ordered are listed, but only abnormal results are displayed) Labs Reviewed  COMPREHENSIVE METABOLIC PANEL - Abnormal; Notable for the following components:      Result Value   Chloride 112 (*)    CO2 21 (*)    Glucose, Bld 141 (*)    BUN 68 (*)    Creatinine, Ser 2.60 (*)    Total Protein 5.8 (*)    Albumin 3.4 (*)    GFR, Estimated 17 (*)    All other components within normal limits  CBC - Abnormal; Notable for the following components:   Hemoglobin 11.3 (*)    MCH 24.8 (*)    MCHC 29.5 (*)    RDW 22.4 (*)    All other components within normal limits    EKG None  Radiology CT Maxillofacial WO CM  Result Date: 08/02/2021 CLINICAL DATA:  Facial trauma, blunt.  Fall. EXAM: CT MAXILLOFACIAL WITHOUT CONTRAST TECHNIQUE: Multidetector CT imaging of the maxillofacial structures was performed. Multiplanar CT image reconstructions were also generated. RADIATION DOSE REDUCTION: This exam was performed according to the departmental dose-optimization program which includes automated exposure control, adjustment of the mA and/or kV according to patient size and/or use of iterative reconstruction technique. COMPARISON:  None Available. FINDINGS: Osseous: No fracture or mandibular dislocation. No destructive process. Orbits: No orbital fracture.  Globes are intact. Sinuses: Minimal mucosal thickening in the floors of the maxillary sinuses. No air-fluid levels. Soft tissues: Negative Limited intracranial: See head CT report  IMPRESSION: No facial or orbital fracture. Electronically Signed   By: Rolm Baptise M.D.   On: 08/02/2021 22:38   CT Cervical Spine Wo Contrast  Result Date: 08/02/2021 CLINICAL DATA:  Fall. EXAM: CT CERVICAL SPINE WITHOUT CONTRAST TECHNIQUE: Multidetector CT imaging of the cervical spine was performed without intravenous contrast. Multiplanar CT image reconstructions were also generated. RADIATION DOSE REDUCTION: This exam was performed according to the departmental dose-optimization program which includes automated exposure control, adjustment of the mA and/or kV according to patient size and/or use of iterative reconstruction technique. COMPARISON:  None Available. FINDINGS: Alignment: There is trace retrolisthesis at C3-C4 which is favored is degenerative. Alignment is otherwise anatomic. Skull base and vertebrae: No acute fracture. No primary bone lesion or focal pathologic process. Soft tissues and spinal canal: No prevertebral fluid or swelling. No visible canal hematoma. Disc levels: There is mild disc space narrowing at C3-C4. Mild degenerative changes are seen throughout facet joints. No significant central canal or neural foraminal stenosis at any level. Upper chest: Negative. Other: There is a 9 mm hypodense left thyroid nodule. IMPRESSION: 1. No acute fracture or traumatic subluxation of the cervical spine. 2. Subcentimeter incidental left thyroid nodule. No follow-up imaging is recommended. Reference: J Am Coll Radiol. 2015 Feb;12(2): 143-50 Electronically Signed  By: Ronney Asters M.D.   On: 08/02/2021 22:37   CT Head Wo Contrast  Result Date: 08/02/2021 CLINICAL DATA:  Head trauma, minor (Age >= 65y).  Fall. EXAM: CT HEAD WITHOUT CONTRAST TECHNIQUE: Contiguous axial images were obtained from the base of the skull through the vertex without intravenous contrast. RADIATION DOSE REDUCTION: This exam was performed according to the departmental dose-optimization program which includes automated  exposure control, adjustment of the mA and/or kV according to patient size and/or use of iterative reconstruction technique. COMPARISON:  None Available. FINDINGS: Brain: No acute intracranial abnormality. Specifically, no hemorrhage, hydrocephalus, mass lesion, acute infarction, or significant intracranial injury. Vascular: No hyperdense vessel or unexpected calcification. Skull: No acute calvarial abnormality. Sinuses/Orbits: No acute findings Other: None IMPRESSION: No acute intracranial abnormality. Electronically Signed   By: Rolm Baptise M.D.   On: 08/02/2021 22:36   DG Knee Complete 4 Views Right  Result Date: 08/02/2021 CLINICAL DATA:  Fall, trauma, pain. EXAM: RIGHT KNEE - COMPLETE 4+ VIEW COMPARISON:  None Available. FINDINGS: No acute fracture or dislocation. Mild tricompartmental osteoarthritis. There is a small knee joint effusion. Prominent soft tissue thickening anteriorly, which generalized soft tissue edema. IMPRESSION: 1. No acute fracture or dislocation of the right knee. 2. Mild tricompartmental osteoarthritis and small joint effusion. 3. Prominent soft tissue thickening anteriorly may represent hematoma, in generalized soft tissue edema. Electronically Signed   By: Keith Rake M.D.   On: 08/02/2021 22:35   DG Knee Complete 4 Views Left  Result Date: 08/02/2021 CLINICAL DATA:  Pain after fall. EXAM: LEFT KNEE - COMPLETE 4+ VIEW COMPARISON:  None Available. FINDINGS: No fracture or dislocation. Mild tricompartmental osteoarthritis, medial compartment joint space narrowing with tricompartmental peripheral spurring. There may be a small knee joint effusion. Generalized soft tissue edema. IMPRESSION: 1. No fracture or dislocation of the left knee. 2. Mild tricompartmental osteoarthritis and possible small joint effusion. 3. Generalized soft tissue edema. Electronically Signed   By: Keith Rake M.D.   On: 08/02/2021 22:32   DG Foot Complete Left  Result Date: 08/02/2021 CLINICAL  DATA:  Pain, fall. EXAM: LEFT FOOT - COMPLETE 3+ VIEW COMPARISON:  Radiograph 10/05/2019 FINDINGS: Overlying artifact from patient's sock. No acute fracture or dislocation. Marked hallux valgus and osteoarthritis of the first metatarsal phalangeal joint. Hammertoe deformity of the digits. Prominent osteoarthritis of the second metatarsal phalangeal joint. Moderate degenerative change of the midfoot. Mild dorsal soft tissue edema. IMPRESSION: 1. No acute fracture or dislocation of the left foot. 2. Osteoarthritis and hammertoe deformity of the digits. 3. Marked hallux valgus. Electronically Signed   By: Keith Rake M.D.   On: 08/02/2021 22:31   DG Hip Unilat  With Pelvis 2-3 Views Right  Result Date: 08/02/2021 CLINICAL DATA:  Fall and trauma to the right hip. EXAM: DG HIP (WITH OR WITHOUT PELVIS) 2-3V RIGHT COMPARISON:  None Available. FINDINGS: There is no acute fracture or dislocation. The bones are osteopenic. There is mild degenerative changes of the right hip. The soft tissues unremarkable. IMPRESSION: No acute fracture or dislocation. Electronically Signed   By: Anner Crete M.D.   On: 08/02/2021 22:30    Procedures Procedures    Medications Ordered in ED Medications  acetaminophen (TYLENOL) tablet 650 mg (650 mg Oral Given 08/02/21 2055)  sodium chloride 0.9 % bolus 500 mL (0 mLs Intravenous Stopped 08/03/21 0031)  oxyCODONE (Oxy IR/ROXICODONE) immediate release tablet 5 mg (5 mg Oral Given 08/02/21 2340)  mupirocin ointment (BACTROBAN) 2 % 1  Application (1 Application Topical Given 08/03/21 0052)    ED Course/ Medical Decision Making/ A&P                           Medical Decision Making Amount and/or Complexity of Data Reviewed Labs: ordered. Radiology: ordered.  Risk OTC drugs. Prescription drug management.   This patient presents to the ED for concern of fall, this involves an extensive number of treatment options, and is a complaint that carries with it a high risk of  complications and morbidity.  The differential diagnosis includes syncope, arrhythmia, fracture, dislocation, neurovascular compromise, compartment syndrome   Co morbidities that complicate the patient evaluation  Obesity with difficulty walking at baseline.  Lab Tests:  I Ordered, and personally interpreted labs.  The pertinent results include:  Baseline renal dysfunction with GFR 17, BUN 68 and creatinine of 2.6 when compared to prior metabolic panels   Imaging Studies ordered:  I ordered imaging studies including CT maxillofacial, head, c-spine, bilateral knee x-ray, left foot x-ray, R hip with pelvis x-ray  I independently visualized and interpreted imaging which showed: CT maxillofacial, head, c-spine: No facial or orbital fx, no acute fx or traumatic subluxation of c-spine. Subcentimeter incidental left thyroid nodule. No acute intracranial abnormalities Left foot x-ray: No acute fx or dislocation. OA and hammertoe deformities of digits.Marked hallux valgus.  R hip w/ pelvis x-ray: No acute fx or dislocation Bilateral knee x-ray: No acute fx. Mild tricompartmental OA and small joint effusion. Prominent soft tissue thickening anteriorly may represent hematoma, soft tissue edema I agree with the radiologist interpretation  Cardiac Monitoring: / EKG:  The patient was maintained on a cardiac monitor.  I personally viewed and interpreted the cardiac monitored which showed an underlying rhythm of: sinus rhythm   Consultations Obtained:  N/a   Problem List / ED Course / Critical interventions / Medication management  Fall I ordered medication including tylenol  for pain  Reevaluation of the patient after these medicines showed that the patient improved I have reviewed the patients home medicines and have made adjustments as needed   Social Determinants of Health:  Assisted living facility   Test / Admission - Considered:  Fall Vitals signs significant for HTN w/ BP  145/45. Otherwise within normal range and stable throughout visit. Laboratory/imaging studies significant for: No acute abnormality. Radiologic results had just come through upon shift change. Plan was to get patient up and attempt to ambulate to guide treatment. Given negative x-rays, I expect paitne to be discharge w/ ortho f/u. Patient care handed off to Dr. Regenia Skeeter at shift change.         Final Clinical Impression(s) / ED Diagnoses Final diagnoses:  Fall, initial encounter  Traumatic hematoma of left knee, initial encounter  Traumatic hematoma of right knee, initial encounter  Ulcer of left foot, limited to breakdown of skin [facility])    Rx / DC Orders ED Discharge Orders     None         Wilnette Kales, [place] 08/03/21 1006    Sherwood Gambler, MD 08/06/21 1048

## 2021-08-03 MED ORDER — MUPIROCIN 2 % EX OINT
1.0000 | TOPICAL_OINTMENT | Freq: Once | CUTANEOUS | Status: AC
  Administered 2021-08-03: 1 via TOPICAL
  Filled 2021-08-03: qty 22

## 2021-08-03 NOTE — ED Notes (Signed)
Care handoff report given to charge RN Butch Penny. Pt was able to use walker but was unsteady following fall. Discussed discharge instructions, follow up care, and medications given. AVS copy sent with family. Pt departing facility now. Per RN staff awaiting pt.

## 2021-08-03 NOTE — ED Notes (Signed)
Pt and family member agreeable with d/c plan as discussed by provider- this nurse has verbally reinforced d/c instructions and provided pt with written copy - pt/family acknowledge verbal understanding and deny any additional questions, concerns, needs -- pt escorted to vehicle via w/c by this nurse and is being accompanied to facility by family member

## 2021-08-03 NOTE — ED Notes (Addendum)
Discussed transportation back to facility with pt and niece Darnelle Bos at bedside. Offered ambulance services if pt feel unable to get back to facility. Pt and family refused. Pt states that she feel comfortable using walker to car. Family at bedside states feeling comfotable taking pt home. Charge RN Butch Penny awaiting pt at facility. Primary RN Elmyra Ricks at bedside as well.

## 2021-08-03 NOTE — ED Notes (Addendum)
Per niece Darnelle Bos and pt requesting return to facility. Family states reaching out the facility and being information pt able to get a bed at assisted part of facility.   This RN called facility to confirm pt unable to return to independent living and will require additional assistance. Spoke to charge RN Butch Penny with [facility] [phone] who confirmed pt able to return to facility. She will have a bed for the night.   TOC SW and CM consult placed to reach out to patient and family. Dr. Florina Ou informed.

## 2021-08-03 NOTE — ED Provider Notes (Signed)
1:35 AM The patient is living facility will make arrangements for her to have assistance overnight at her living facility as she usually lives alone.  She is able to ambulate using a walker but is having some difficulty especially with turns.     Xochil Shanker, Jenny Reichmann, MD 08/03/21 [phone]

## 2021-08-03 NOTE — ED Notes (Addendum)
Base of left great toe, wound noted.  Applied Bactroban and dsg

## 2021-08-03 NOTE — ED Notes (Addendum)
Pts niece Darnelle Bos can be reached at [phone]

## 2021-08-20 ENCOUNTER — Ambulatory Visit: Payer: Medicare Other | Admitting: Nurse Practitioner

## 2021-08-22 ENCOUNTER — Ambulatory Visit: Payer: Medicare Other | Admitting: Family

## 2021-08-29 ENCOUNTER — Telehealth: Payer: Self-pay

## 2021-08-29 NOTE — Telephone Encounter (Signed)
LM-08/29/21-Chart review started. Reviewing OV, Consults, Hospital visits, Labs and medication changes.  Chart review completed. Calling pt. To complete general review call. Unable to reach pt. [place].  Assess for any falls, home BG, leg swelling  **Schedule F/u for Oct   Total time spent: 13 min.

## 2021-09-03 NOTE — Telephone Encounter (Signed)
LM-09/03/21-Pt. Returned call. Completed General Review call. Pt. Requested I call her in one month to schedule CP focused outreach as she is currently living in a rehab facility.Set a task to give pt. A call in one month.  Total time spent: 25 min.

## 2021-09-03 NOTE — Telephone Encounter (Signed)
LM-09/03/21-Called pt. To Schedule F/u for October and complete General Review Call and  assess for any falls, home BG, leg swelling LVMTRC X2.  Total time spent: 2 min.

## 2021-09-17 DIAGNOSIS — E7800 Pure hypercholesterolemia, unspecified: Secondary | ICD-10-CM | POA: Diagnosis not present

## 2021-09-17 DIAGNOSIS — N184 Chronic kidney disease, stage 4 (severe): Secondary | ICD-10-CM | POA: Diagnosis not present

## 2021-09-17 DIAGNOSIS — E1122 Type 2 diabetes mellitus with diabetic chronic kidney disease: Secondary | ICD-10-CM | POA: Diagnosis not present

## 2021-09-17 DIAGNOSIS — E061 Subacute thyroiditis: Secondary | ICD-10-CM | POA: Diagnosis not present

## 2021-09-27 ENCOUNTER — Other Ambulatory Visit: Payer: Self-pay | Admitting: Internal Medicine

## 2021-10-04 ENCOUNTER — Telehealth: Payer: Self-pay

## 2021-10-04 NOTE — Telephone Encounter (Signed)
LM-10/04/21-Called pt. And scheduled FOC call for 10/17 at 3:30PM. Informed pt.I will call to confirm visit a few days prior to visit. Pt. Verbalized understanding and agreed.  Total time spent: 5 min.

## 2021-10-13 ENCOUNTER — Emergency Department (HOSPITAL_COMMUNITY): Payer: Medicare Other

## 2021-10-13 ENCOUNTER — Inpatient Hospital Stay (HOSPITAL_COMMUNITY)
Admission: EM | Admit: 2021-10-13 | Discharge: 2021-10-20 | DRG: 871 | Disposition: A | Discharge status: Skilled Nursing Facility | Payer: Medicare Other | Source: Skilled Nursing Facility | Attending: Internal Medicine | Admitting: Internal Medicine

## 2021-10-13 ENCOUNTER — Other Ambulatory Visit: Payer: Self-pay

## 2021-10-13 ENCOUNTER — Encounter (HOSPITAL_COMMUNITY): Payer: Self-pay

## 2021-10-13 DIAGNOSIS — D631 Anemia in chronic kidney disease: Secondary | ICD-10-CM | POA: Diagnosis present

## 2021-10-13 DIAGNOSIS — J9601 Acute respiratory failure with hypoxia: Secondary | ICD-10-CM | POA: Diagnosis present

## 2021-10-13 DIAGNOSIS — K219 Gastro-esophageal reflux disease without esophagitis: Secondary | ICD-10-CM | POA: Diagnosis present

## 2021-10-13 DIAGNOSIS — I5033 Acute on chronic diastolic (congestive) heart failure: Secondary | ICD-10-CM | POA: Diagnosis present

## 2021-10-13 DIAGNOSIS — E7800 Pure hypercholesterolemia, unspecified: Secondary | ICD-10-CM | POA: Diagnosis present

## 2021-10-13 DIAGNOSIS — Z20822 Contact with and (suspected) exposure to covid-19: Secondary | ICD-10-CM | POA: Diagnosis present

## 2021-10-13 DIAGNOSIS — B951 Streptococcus, group B, as the cause of diseases classified elsewhere: Secondary | ICD-10-CM

## 2021-10-13 DIAGNOSIS — Z888 Allergy status to other drugs, medicaments and biological substances status: Secondary | ICD-10-CM

## 2021-10-13 DIAGNOSIS — E1122 Type 2 diabetes mellitus with diabetic chronic kidney disease: Secondary | ICD-10-CM | POA: Diagnosis present

## 2021-10-13 DIAGNOSIS — K649 Unspecified hemorrhoids: Secondary | ICD-10-CM | POA: Diagnosis present

## 2021-10-13 DIAGNOSIS — Z713 Dietary counseling and surveillance: Secondary | ICD-10-CM

## 2021-10-13 DIAGNOSIS — Z87891 Personal history of nicotine dependence: Secondary | ICD-10-CM

## 2021-10-13 DIAGNOSIS — Z7982 Long term (current) use of aspirin: Secondary | ICD-10-CM

## 2021-10-13 DIAGNOSIS — Z22322 Carrier or suspected carrier of Methicillin resistant Staphylococcus aureus: Secondary | ICD-10-CM

## 2021-10-13 DIAGNOSIS — Z85828 Personal history of other malignant neoplasm of skin: Secondary | ICD-10-CM

## 2021-10-13 DIAGNOSIS — C946 Myelodysplastic disease, not classified: Secondary | ICD-10-CM | POA: Diagnosis present

## 2021-10-13 DIAGNOSIS — R6521 Severe sepsis with septic shock: Secondary | ICD-10-CM | POA: Diagnosis present

## 2021-10-13 DIAGNOSIS — E039 Hypothyroidism, unspecified: Secondary | ICD-10-CM | POA: Diagnosis present

## 2021-10-13 DIAGNOSIS — R7881 Bacteremia: Secondary | ICD-10-CM

## 2021-10-13 DIAGNOSIS — E1140 Type 2 diabetes mellitus with diabetic neuropathy, unspecified: Secondary | ICD-10-CM | POA: Diagnosis present

## 2021-10-13 DIAGNOSIS — G40909 Epilepsy, unspecified, not intractable, without status epilepticus: Secondary | ICD-10-CM | POA: Diagnosis present

## 2021-10-13 DIAGNOSIS — A401 Sepsis due to streptococcus, group B: Principal | ICD-10-CM | POA: Diagnosis present

## 2021-10-13 DIAGNOSIS — N179 Acute kidney failure, unspecified: Secondary | ICD-10-CM | POA: Diagnosis present

## 2021-10-13 DIAGNOSIS — N184 Chronic kidney disease, stage 4 (severe): Secondary | ICD-10-CM | POA: Diagnosis present

## 2021-10-13 DIAGNOSIS — K5900 Constipation, unspecified: Secondary | ICD-10-CM | POA: Diagnosis present

## 2021-10-13 DIAGNOSIS — Z79890 Hormone replacement therapy (postmenopausal): Secondary | ICD-10-CM

## 2021-10-13 DIAGNOSIS — Z79899 Other long term (current) drug therapy: Secondary | ICD-10-CM

## 2021-10-13 DIAGNOSIS — E872 Acidosis, unspecified: Secondary | ICD-10-CM | POA: Diagnosis present

## 2021-10-13 DIAGNOSIS — I350 Nonrheumatic aortic (valve) stenosis: Secondary | ICD-10-CM | POA: Diagnosis present

## 2021-10-13 DIAGNOSIS — E875 Hyperkalemia: Secondary | ICD-10-CM | POA: Diagnosis present

## 2021-10-13 DIAGNOSIS — I130 Hypertensive heart and chronic kidney disease with heart failure and stage 1 through stage 4 chronic kidney disease, or unspecified chronic kidney disease: Secondary | ICD-10-CM | POA: Diagnosis present

## 2021-10-13 DIAGNOSIS — E559 Vitamin D deficiency, unspecified: Secondary | ICD-10-CM | POA: Diagnosis present

## 2021-10-13 DIAGNOSIS — Z6834 Body mass index (BMI) 34.0-34.9, adult: Secondary | ICD-10-CM

## 2021-10-13 DIAGNOSIS — Z66 Do not resuscitate: Secondary | ICD-10-CM | POA: Diagnosis present

## 2021-10-13 DIAGNOSIS — E669 Obesity, unspecified: Secondary | ICD-10-CM | POA: Diagnosis present

## 2021-10-13 DIAGNOSIS — R269 Unspecified abnormalities of gait and mobility: Secondary | ICD-10-CM | POA: Diagnosis present

## 2021-10-13 DIAGNOSIS — M1990 Unspecified osteoarthritis, unspecified site: Secondary | ICD-10-CM | POA: Diagnosis present

## 2021-10-13 DIAGNOSIS — R6510 Systemic inflammatory response syndrome (SIRS) of non-infectious origin without acute organ dysfunction: Secondary | ICD-10-CM

## 2021-10-13 DIAGNOSIS — Z794 Long term (current) use of insulin: Secondary | ICD-10-CM

## 2021-10-13 DIAGNOSIS — E1165 Type 2 diabetes mellitus with hyperglycemia: Secondary | ICD-10-CM | POA: Diagnosis present

## 2021-10-13 DIAGNOSIS — M109 Gout, unspecified: Secondary | ICD-10-CM | POA: Diagnosis present

## 2021-10-13 LAB — CBC WITH DIFFERENTIAL/PLATELET
Abs Immature Granulocytes: 0.32 10*3/uL — ABNORMAL HIGH (ref 0.00–0.07)
Basophils Absolute: 0 10*3/uL (ref 0.0–0.1)
Basophils Relative: 0 %
Eosinophils Absolute: 0.4 10*3/uL (ref 0.0–0.5)
Eosinophils Relative: 3 %
HCT: 36.6 % (ref 36.0–46.0)
Hemoglobin: 10.9 g/dL — ABNORMAL LOW (ref 12.0–15.0)
Immature Granulocytes: 3 %
Lymphocytes Relative: 2 %
Lymphs Abs: 0.3 10*3/uL — ABNORMAL LOW (ref 0.7–4.0)
MCH: 26.7 pg (ref 26.0–34.0)
MCHC: 29.8 g/dL — ABNORMAL LOW (ref 30.0–36.0)
MCV: 89.7 fL (ref 80.0–100.0)
Monocytes Absolute: 0.6 10*3/uL (ref 0.1–1.0)
Monocytes Relative: 5 %
Neutro Abs: 10.9 10*3/uL — ABNORMAL HIGH (ref 1.7–7.7)
Neutrophils Relative %: 87 %
Platelets: 351 10*3/uL (ref 150–400)
RBC: 4.08 MIL/uL (ref 3.87–5.11)
RDW: 20.1 % — ABNORMAL HIGH (ref 11.5–15.5)
WBC: 12.5 10*3/uL — ABNORMAL HIGH (ref 4.0–10.5)
nRBC: 0 % (ref 0.0–0.2)

## 2021-10-13 MED ORDER — LACTATED RINGERS IV SOLN: INTRAVENOUS | Status: DC

## 2021-10-13 MED ORDER — METRONIDAZOLE 500 MG/100ML IV SOLN
500.0000 mg | Freq: Once | INTRAVENOUS | Status: AC
  Administered 2021-10-14: 500 mg via INTRAVENOUS
  Filled 2021-10-13: qty 100

## 2021-10-13 MED ORDER — VANCOMYCIN HCL IN DEXTROSE 1-5 GM/200ML-% IV SOLN
1000.0000 mg | Freq: Once | INTRAVENOUS | Status: AC
  Administered 2021-10-14: 1000 mg via INTRAVENOUS
  Filled 2021-10-13: qty 200

## 2021-10-13 MED ORDER — SODIUM CHLORIDE 0.9 % IV SOLN
2.0000 g | Freq: Once | INTRAVENOUS | Status: AC
  Administered 2021-10-13: 2 g via INTRAVENOUS
  Filled 2021-10-13: qty 12.5

## 2021-10-13 NOTE — ED Provider Notes (Incomplete)
[facility] DEPT Provider Note   CSN: [id number] Arrival date & time: 10/13/21  2308     History {Add pertinent medical, surgical, social history, OB history to HPI:1} Chief Complaint  Patient presents with  . Urinary Tract Infection    [name] [name] is a 88 y.o. female wo arrives via EMS with a fever and hypoxia. She has a pmh of IDDM, unspecified anemia, reflux, hypertension, hyperlipidemia, hypothyroidism, stage IV kidney disease , seizure disorder on Keppra, aortic valve stenosis.  Patient states that she was in her normal state of health up until this evening when she began having shaking chills.  She states that she could not get warm.  She called the security person at her assisted living facility who came to check on her she was noted to have a fever of 102.  He got her a dose of extra strength Tylenol and called EMS.  Upon arrival patient was found to be hypoxic in the 80s and is now satting about 90% on 4 L via nasal cannula.  She has no previous history of heart failure, lung disease, COPD, asthma, or oxygen dependence.  She reports that she has been having increased urinary frequency and thinks that she may have a urinary tract infection.  She also notes that she has a small ulcer on the ball of her left foot which she has been treating at home.  She has bilateral lower extremity edema which is chronic and unchanged and her left leg is more swollen than her right which she states is also chronic.  She denies cough, wheezing, nausea, vomiting.  She does not know if anyone else in her facility has been sick.   Urinary Tract Infection      Home Medications Prior to Admission medications   Medication Sig Start Date End Date Taking? Authorizing Provider  acetaminophen (TYLENOL) 500 MG tablet Take 1,000 mg by mouth every 6 (six) hours as needed for moderate pain.    [provider]  allopurinol (ZYLOPRIM) 100 MG tablet Take 1 tablet (100 mg  total) by mouth 2 (two) times daily. Take  1 tablet  Daily  to prevent Gout. 02/07/21   Alycia Rossetti, NP  aspirin 81 MG tablet Take 81 mg by mouth daily.    [provider]  atenolol (TENORMIN) 50 MG tablet TAKE ONE TABLET BY MOUTH DAILY GENERIC EQUIVALENT FOR TENORMIN 06/06/21   Darrol Jump, NP  BD VEO INSULIN SYRINGE U/F 31G X 15/64" 0.3 ML MISC 2 (two) times daily. 06/18/21   [provider]  Blood Glucose Monitoring Suppl (ONETOUCH VERIO) w/Device KIT Check  Blood Sugar   4 x /day before Meals & Bedtime   (( Dx-  e11.22 )) 11/03/20   Unk Pinto, MD  cholecalciferol (VITAMIN D3) 25 MCG (1000 UNIT) tablet Take 3,000 Units by mouth daily.    [provider]  doxycycline (VIBRA-TABS) 100 MG tablet Take 1 tablet (100 mg total) by mouth 2 (two) times daily. 07/02/21   Newt Minion, MD  fish oil-omega-3 fatty acids 1000 MG capsule Take 1,000 mg by mouth at bedtime.    [provider]  furosemide (LASIX) 40 MG tablet TAKE 1 TABLET BY MOUTH DAILY FOR FLUID RETENTION AND ANKLE SWELLING 03/21/21   Alycia Rossetti, NP  glucose blood [facility] VERIO) test strip Check Blood Sugar 4 x /day  (( Dx-  e11.22 )) 11/03/20   Unk Pinto, MD  HUMULIN 70/30 (70-30) 100 UNIT/ML  injection INJECT 20 UNITS UNDER THE SKIN TWICE DAILY WITH MEALS 06/25/21   Alycia Rossetti, NP  hydrocortisone (ANUSOL-HC) 2.5 % rectal cream Place rectally 2 (two) times daily. 04/06/20   Kayleen Memos, DO  Lancets [facility] ULTRASOFT) lancets Check Blood Sugar  4 x /day  before Meals & Bedtime   (( Dx-  e11.22 )) 11/03/20   Unk Pinto, MD  lansoprazole (PREVACID) 30 MG capsule TAKE 1 CAPSULE BY MOUTH DAILY TO PREVENT HEARTBURN AND INDIGESTION. 12/19/20   Alycia Rossetti, NP  levETIRAcetam (KEPPRA) 500 MG tablet Take  1 tablet 2  x /day  with Meals  to Prevent Seizures 07/10/21   Unk Pinto, MD  levothyroxine (SYNTHROID) 100 MCG tablet TAKE 1 TABLET BY MOUTH DAILY ON AN EMPTY STOMACH  WITH ONLY WATER FOR 30 MINUTES. NO ANTACIDS, CALCIUM OR MAGNESIUM FOR 4 HOURS. AVOID BIOTIN 11/29/20   Alycia Rossetti, NP  loratadine (CLARITIN) 10 MG tablet Take 10 mg by mouth daily.    [provider]  meclizine (ANTIVERT) 25 MG tablet Take   1 tablet 2 to 3 x /day  ONLY  if needed for Dizziness Bradly Bienenstock 07/10/21   Unk Pinto, MD  rOPINIRole (REQUIP) 1 MG tablet Take  1/2 to 1 tablet  3 x /day  as needed for Restless Legs & Cramps / Patient knows to take by mouth 10/17/20   Unk Pinto, MD  vitamin B-12 (CYANOCOBALAMIN) 100 MCG tablet Take 500 mcg by mouth daily.    [provider]  vitamin C (ASCORBIC ACID) 500 MG tablet Take 500 mg by mouth daily.    [provider]      Allergies    Ppd [tuberculin purified protein derivative]    Review of Systems   Review of Systems  Physical Exam Updated Vital Signs SpO2 (!) 83%  Physical Exam Vitals and nursing note reviewed.  Constitutional:      General: She is not in acute distress.    Appearance: She is well-developed. She is ill-appearing. She is not toxic-appearing or diaphoretic.  HENT:     Head: Normocephalic and atraumatic.     Right Ear: External ear normal.     Left Ear: External ear normal.     Nose: Nose normal.     Mouth/Throat:     Mouth: Mucous membranes are moist.  Eyes:     General: No scleral icterus.    Conjunctiva/sclera: Conjunctivae normal.  Cardiovascular:     Rate and Rhythm: Normal rate and regular rhythm.     Heart sounds: Murmur heard.     No friction rub. No gallop.  Pulmonary:     Effort: Pulmonary effort is normal. No respiratory distress.     Breath sounds: Normal breath sounds. No rales.     Comments: Fine crackles in the bases of both lungs Abdominal:     General: Bowel sounds are normal. There is no distension.     Palpations: Abdomen is soft. There is no mass.     Tenderness: There is no abdominal tenderness. There is no guarding.  Musculoskeletal:      Cervical back: Normal range of motion.  Skin:    General: Skin is warm and dry.  Neurological:     Mental Status: She is alert and oriented to person, place, and time.  Psychiatric:        Behavior: Behavior normal.     ED Results / Procedures / Treatments   Labs (all labs ordered are  listed, but only abnormal results are displayed) Labs Reviewed - No data to display  EKG None  Radiology No results found.  Procedures Procedures  {Document cardiac monitor, telemetry assessment procedure when appropriate:1}  Medications Ordered in ED Medications - No data to display  ED Course/ Medical Decision Making/ A&P                           Medical Decision Making  ***  {Document critical care time when appropriate:1} {Document review of labs and clinical decision tools ie heart score, Chads2Vasc2 etc:1}  {Document your independent review of radiology images, and any outside records:1} {Document your discussion with family members, caretakers, and with consultants:1} {Document social determinants of health affecting pt's care:1} {Document your decision making why or why not admission, treatments were needed:1} Final Clinical Impression(s) / ED Diagnoses Final diagnoses:  None    Rx / DC Orders ED Discharge Orders     None

## 2021-10-13 NOTE — Progress Notes (Signed)
A consult was received from an ED physician for vancomycin and cefepime per pharmacy dosing.  The patient's profile has been reviewed for ht/wt/allergies/indication/available labs.   A one time order has been placed for vancomycin 1gm and cefepime 2gm.    Further antibiotics/pharmacy consults should be ordered by admitting physician if indicated.                       Thank you, Dolly Rias RPh 10/13/2021, 11:52 PM

## 2021-10-13 NOTE — ED Provider Notes (Signed)
[place] DEPT Provider Note   CSN: [id number] Arrival date & time: 10/13/21  2308     History  Chief Complaint  Patient presents with   Urinary Tract Infection    [name] [name] is a 88 y.o. female wo arrives via EMS with a fever and hypoxia. She has a pmh of IDDM, unspecified anemia, reflux, hypertension, hyperlipidemia, hypothyroidism, stage IV kidney disease , seizure disorder on Keppra, aortic valve stenosis.  Patient states that she was in her normal state of health up until this evening when she began having shaking chills.  She states that she could not get warm.  She called the security person at her assisted living facility who came to check on her she was noted to have a fever of 102.  He got her a dose of extra strength Tylenol and called EMS.  Upon arrival patient was found to be hypoxic in the 80s and is now satting about 90% on 4 L via nasal cannula.  She has no previous history of heart failure, lung disease, COPD, asthma, or oxygen dependence.  She reports that she has been having increased urinary frequency and thinks that she may have a urinary tract infection.  She also notes that she has a small ulcer on the ball of her left foot which she has been treating at home.  She has bilateral lower extremity edema which is chronic and unchanged and her left leg is more swollen than her right which she states is also chronic.  She denies cough, wheezing, nausea, vomiting.  She does not know if anyone else in her facility has been sick.   Urinary Tract Infection      Home Medications Prior to Admission medications   Medication Sig Start Date End Date Taking? Authorizing Provider  acetaminophen (TYLENOL) 500 MG tablet Take 1,000 mg by mouth every 6 (six) hours as needed for moderate pain.    [provider]  allopurinol (ZYLOPRIM) 100 MG tablet Take 1 tablet (100 mg total) by mouth 2 (two) times daily. Take  1 tablet  Daily  to prevent  Gout. 02/07/21   Alycia Rossetti, NP  aspirin 81 MG tablet Take 81 mg by mouth daily.    [provider]  atenolol (TENORMIN) 50 MG tablet TAKE ONE TABLET BY MOUTH DAILY GENERIC EQUIVALENT FOR TENORMIN 06/06/21   Darrol Jump, NP  BD VEO INSULIN SYRINGE U/F 31G X 15/64" 0.3 ML MISC 2 (two) times daily. 06/18/21   [provider]  Blood Glucose Monitoring Suppl (ONETOUCH VERIO) w/Device KIT Check  Blood Sugar   4 x /day before Meals & Bedtime   (( Dx-  e11.22 )) 11/03/20   Unk Pinto, MD  cholecalciferol (VITAMIN D3) 25 MCG (1000 UNIT) tablet Take 3,000 Units by mouth daily.    [provider]  doxycycline (VIBRA-TABS) 100 MG tablet Take 1 tablet (100 mg total) by mouth 2 (two) times daily. 07/02/21   Newt Minion, MD  fish oil-omega-3 fatty acids 1000 MG capsule Take 1,000 mg by mouth at bedtime.    [provider]  furosemide (LASIX) 40 MG tablet TAKE 1 TABLET BY MOUTH DAILY FOR FLUID RETENTION AND ANKLE SWELLING 03/21/21   Alycia Rossetti, NP  glucose blood (ONETOUCH VERIO) test strip Check Blood Sugar 4 x /day  (( Dx-  e11.22 )) 11/03/20   Unk Pinto, MD  HUMULIN 70/30 (70-30) 100 UNIT/ML injection INJECT 20 UNITS UNDER THE SKIN TWICE DAILY  WITH MEALS 06/25/21   Alycia Rossetti, NP  hydrocortisone (ANUSOL-HC) 2.5 % rectal cream Place rectally 2 (two) times daily. 04/06/20   Kayleen Memos, DO  Lancets [facility] ULTRASOFT) lancets Check Blood Sugar  4 x /day  before Meals & Bedtime   (( Dx-  e11.22 )) 11/03/20   Unk Pinto, MD  lansoprazole (PREVACID) 30 MG capsule TAKE 1 CAPSULE BY MOUTH DAILY TO PREVENT HEARTBURN AND INDIGESTION. 12/19/20   Alycia Rossetti, NP  levETIRAcetam (KEPPRA) 500 MG tablet Take  1 tablet 2  x /day  with Meals  to Prevent Seizures 07/10/21   Unk Pinto, MD  levothyroxine (SYNTHROID) 100 MCG tablet TAKE 1 TABLET BY MOUTH DAILY ON AN EMPTY STOMACH WITH ONLY WATER FOR 30 MINUTES. NO ANTACIDS, CALCIUM OR MAGNESIUM FOR 4  HOURS. AVOID BIOTIN 11/29/20   Alycia Rossetti, NP  loratadine (CLARITIN) 10 MG tablet Take 10 mg by mouth daily.    [provider]  meclizine (ANTIVERT) 25 MG tablet Take   1 tablet 2 to 3 x /day  ONLY  if needed for Dizziness Bradly Bienenstock 07/10/21   Unk Pinto, MD  rOPINIRole (REQUIP) 1 MG tablet Take  1/2 to 1 tablet  3 x /day  as needed for Restless Legs & Cramps / Patient knows to take by mouth 10/17/20   Unk Pinto, MD  vitamin B-12 (CYANOCOBALAMIN) 100 MCG tablet Take 500 mcg by mouth daily.    [provider]  vitamin C (ASCORBIC ACID) 500 MG tablet Take 500 mg by mouth daily.    [provider]      Allergies    Ppd [tuberculin purified protein derivative]    Review of Systems   Review of Systems  Physical Exam Updated Vital Signs SpO2 (!) 83%  Physical Exam Vitals and nursing note reviewed.  Constitutional:      General: She is not in acute distress.    Appearance: She is well-developed. She is ill-appearing. She is not toxic-appearing or diaphoretic.  HENT:     Head: Normocephalic and atraumatic.     Right Ear: External ear normal.     Left Ear: External ear normal.     Nose: Nose normal.     Mouth/Throat:     Mouth: Mucous membranes are moist.  Eyes:     General: No scleral icterus.    Conjunctiva/sclera: Conjunctivae normal.  Cardiovascular:     Rate and Rhythm: Normal rate and regular rhythm.     Heart sounds: Murmur heard.     No friction rub. No gallop.  Pulmonary:     Effort: Pulmonary effort is normal. No respiratory distress.     Breath sounds: Normal breath sounds. No rales.     Comments: Fine crackles in the bases of both lungs Abdominal:     General: Bowel sounds are normal. There is no distension.     Palpations: Abdomen is soft. There is no mass.     Tenderness: There is no abdominal tenderness. There is no guarding.  Musculoskeletal:     Cervical back: Normal range of motion.  Skin:    General: Skin is warm  and dry.  Neurological:     Mental Status: She is alert and oriented to person, place, and time.  Psychiatric:        Behavior: Behavior normal.     ED Results / Procedures / Treatments   Labs (all labs ordered are listed, but only abnormal results are displayed) Labs Reviewed -  No data to display  EKG None  Radiology No results found.  Procedures .Critical Care  Performed by: Margarita Mail, PA-C Authorized by: Margarita Mail, PA-C   Critical care provider statement:    Critical care time (minutes):  60   Critical care time was exclusive of:  Separately billable procedures and treating other patients   Critical care was time spent personally by me on the following activities:  Development of treatment plan with patient or surrogate, discussions with consultants, evaluation of patient's response to treatment, examination of patient, ordering and review of laboratory studies, ordering and review of radiographic studies, ordering and performing treatments and interventions, pulse oximetry, re-evaluation of patient's condition and review of old charts     Medications Ordered in ED Medications - No data to display  ED Course/ Medical Decision Making/ A&P Clinical Course as of 10/14/21 0204  Sun Oct 14, 2021  0032 WBC(!): 12.5 [AH]  0033 Creatinine(!): 2.12 Creatinine is unchanged [AH]  0036 pH, Ven(!): 7.46 [AH]  0036 Creatinine(!): 2.12 Creatinine is at baseline [AH]  0036 Troponin I (High Sensitivity)(!): 19 [AH]  0036 Lactic Acid, Venous(!!): 2.5 [AH]    Clinical Course User Index [AH] Margarita Mail, PA-C                           Medical Decision Making Patient here with hypoxia and fever. Original diagnosis includes sepsis, viral infection, pulmonary edema, pulmonary embolus, UTI, osteomyelitis. Patient treated as sepsis given fever.  Review of patient's labs shows elevated white blood cell count, CMP with stable renal insufficiency, elevated blood glucose,  BNP is stable, mildly elevated troponin level likely demand ischemia. I independently interpreted 1 view chest x-ray shows no acute findings.  She does have some cardiomegaly and vascular congestion.  Patient receiving fluids.  I started the patient on heparin as her COVID test is negative.  She does not appear to have any overt edema and has left greater than right leg swelling.  She is unable to obtain CT angiogram due to her renal insufficiency.  Case discussed with Dr. Nevada Crane who will admit the patient.  She seems to be improving.  She is currently on 4 L via nasal cannula.  Unsure of the etiology of her acute hypoxic respiratory failure.  Amount and/or Complexity of Data Reviewed Labs: ordered. Decision-making details documented in ED Course. Radiology: ordered and independent interpretation performed. ECG/medicine tests: ordered.    Details: EKG shows sinus rhythm with a right bundle blanch block  Risk Prescription drug management. Decision regarding hospitalization.           Final Clinical Impression(s) / ED Diagnoses Final diagnoses:  None    Rx / DC Orders ED Discharge Orders     None         Margarita Mail, PA-C 10/14/21 [id number]    Palumbo, April, MD 10/14/21 [id number]

## 2021-10-13 NOTE — Progress Notes (Signed)
Pt being followed by [facility] for Sepsis protocol. 

## 2021-10-13 NOTE — ED Triage Notes (Signed)
Pt bib from home/ [facility]. Pt c/o bilateral leg pain and of having a UTI, facility reports pt had fever. Pt c/o urinary frequency. Denies SOB/chest pain

## 2021-10-14 ENCOUNTER — Inpatient Hospital Stay (HOSPITAL_COMMUNITY): Payer: Medicare Other

## 2021-10-14 ENCOUNTER — Encounter (HOSPITAL_COMMUNITY): Payer: Self-pay | Admitting: Internal Medicine

## 2021-10-14 DIAGNOSIS — B951 Streptococcus, group B, as the cause of diseases classified elsewhere: Secondary | ICD-10-CM | POA: Diagnosis not present

## 2021-10-14 DIAGNOSIS — D631 Anemia in chronic kidney disease: Secondary | ICD-10-CM | POA: Diagnosis present

## 2021-10-14 DIAGNOSIS — R7881 Bacteremia: Secondary | ICD-10-CM | POA: Diagnosis not present

## 2021-10-14 DIAGNOSIS — Z794 Long term (current) use of insulin: Secondary | ICD-10-CM | POA: Diagnosis not present

## 2021-10-14 DIAGNOSIS — J9601 Acute respiratory failure with hypoxia: Secondary | ICD-10-CM | POA: Diagnosis present

## 2021-10-14 DIAGNOSIS — E872 Acidosis, unspecified: Secondary | ICD-10-CM | POA: Diagnosis present

## 2021-10-14 DIAGNOSIS — A401 Sepsis due to streptococcus, group B: Secondary | ICD-10-CM | POA: Diagnosis present

## 2021-10-14 DIAGNOSIS — E1165 Type 2 diabetes mellitus with hyperglycemia: Secondary | ICD-10-CM | POA: Diagnosis present

## 2021-10-14 DIAGNOSIS — R6521 Severe sepsis with septic shock: Secondary | ICD-10-CM | POA: Diagnosis present

## 2021-10-14 DIAGNOSIS — Z66 Do not resuscitate: Secondary | ICD-10-CM | POA: Diagnosis present

## 2021-10-14 DIAGNOSIS — I5030 Unspecified diastolic (congestive) heart failure: Secondary | ICD-10-CM | POA: Diagnosis not present

## 2021-10-14 DIAGNOSIS — I5033 Acute on chronic diastolic (congestive) heart failure: Secondary | ICD-10-CM | POA: Diagnosis present

## 2021-10-14 DIAGNOSIS — N184 Chronic kidney disease, stage 4 (severe): Secondary | ICD-10-CM | POA: Diagnosis present

## 2021-10-14 DIAGNOSIS — E1140 Type 2 diabetes mellitus with diabetic neuropathy, unspecified: Secondary | ICD-10-CM | POA: Diagnosis present

## 2021-10-14 DIAGNOSIS — R609 Edema, unspecified: Secondary | ICD-10-CM | POA: Diagnosis not present

## 2021-10-14 DIAGNOSIS — M1990 Unspecified osteoarthritis, unspecified site: Secondary | ICD-10-CM | POA: Diagnosis present

## 2021-10-14 DIAGNOSIS — N179 Acute kidney failure, unspecified: Secondary | ICD-10-CM | POA: Diagnosis present

## 2021-10-14 DIAGNOSIS — C946 Myelodysplastic disease, not classified: Secondary | ICD-10-CM | POA: Diagnosis present

## 2021-10-14 DIAGNOSIS — E669 Obesity, unspecified: Secondary | ICD-10-CM | POA: Diagnosis present

## 2021-10-14 DIAGNOSIS — A419 Sepsis, unspecified organism: Secondary | ICD-10-CM | POA: Diagnosis not present

## 2021-10-14 DIAGNOSIS — I350 Nonrheumatic aortic (valve) stenosis: Secondary | ICD-10-CM | POA: Diagnosis present

## 2021-10-14 DIAGNOSIS — E7800 Pure hypercholesterolemia, unspecified: Secondary | ICD-10-CM | POA: Diagnosis present

## 2021-10-14 DIAGNOSIS — E039 Hypothyroidism, unspecified: Secondary | ICD-10-CM | POA: Diagnosis present

## 2021-10-14 DIAGNOSIS — E1122 Type 2 diabetes mellitus with diabetic chronic kidney disease: Secondary | ICD-10-CM | POA: Diagnosis present

## 2021-10-14 DIAGNOSIS — R269 Unspecified abnormalities of gait and mobility: Secondary | ICD-10-CM | POA: Diagnosis present

## 2021-10-14 DIAGNOSIS — E559 Vitamin D deficiency, unspecified: Secondary | ICD-10-CM | POA: Diagnosis present

## 2021-10-14 DIAGNOSIS — G40909 Epilepsy, unspecified, not intractable, without status epilepticus: Secondary | ICD-10-CM | POA: Diagnosis present

## 2021-10-14 DIAGNOSIS — I130 Hypertensive heart and chronic kidney disease with heart failure and stage 1 through stage 4 chronic kidney disease, or unspecified chronic kidney disease: Secondary | ICD-10-CM | POA: Diagnosis present

## 2021-10-14 DIAGNOSIS — Z20822 Contact with and (suspected) exposure to covid-19: Secondary | ICD-10-CM | POA: Diagnosis present

## 2021-10-14 LAB — COMPREHENSIVE METABOLIC PANEL
ALT: 16 U/L (ref 0–44)
ALT: 17 U/L (ref 0–44)
AST: 22 U/L (ref 15–41)
AST: 27 U/L (ref 15–41)
Albumin: 2.9 g/dL — ABNORMAL LOW (ref 3.5–5.0)
Albumin: 3.3 g/dL — ABNORMAL LOW (ref 3.5–5.0)
Alkaline Phosphatase: 56 U/L (ref 38–126)
Alkaline Phosphatase: 65 U/L (ref 38–126)
Anion gap: 10 (ref 5–15)
Anion gap: 7 (ref 5–15)
BUN: 39 mg/dL — ABNORMAL HIGH (ref 8–23)
BUN: 41 mg/dL — ABNORMAL HIGH (ref 8–23)
CO2: 21 mmol/L — ABNORMAL LOW (ref 22–32)
CO2: 22 mmol/L (ref 22–32)
Calcium: 9.1 mg/dL (ref 8.9–10.3)
Calcium: 9.2 mg/dL (ref 8.9–10.3)
Chloride: 108 mmol/L (ref 98–111)
Chloride: 112 mmol/L — ABNORMAL HIGH (ref 98–111)
Creatinine, Ser: 2.12 mg/dL — ABNORMAL HIGH (ref 0.44–1.00)
Creatinine, Ser: 2.17 mg/dL — ABNORMAL HIGH (ref 0.44–1.00)
GFR, Estimated: 22 mL/min — ABNORMAL LOW (ref >60)
GFR, Estimated: 22 mL/min — ABNORMAL LOW (ref >60)
Glucose, Bld: 190 mg/dL — ABNORMAL HIGH (ref 70–99)
Glucose, Bld: 278 mg/dL — ABNORMAL HIGH (ref 70–99)
Potassium: 4.3 mmol/L (ref 3.5–5.1)
Potassium: 4.4 mmol/L (ref 3.5–5.1)
Sodium: 139 mmol/L (ref 135–145)
Sodium: 141 mmol/L (ref 135–145)
Total Bilirubin: 0.9 mg/dL (ref 0.3–1.2)
Total Bilirubin: 0.9 mg/dL (ref 0.3–1.2)
Total Protein: 5.6 g/dL — ABNORMAL LOW (ref 6.5–8.1)
Total Protein: 5.9 g/dL — ABNORMAL LOW (ref 6.5–8.1)

## 2021-10-14 LAB — PROTIME-INR
INR: 1.3 — ABNORMAL HIGH (ref 0.8–1.2)
Prothrombin Time: 15.9 seconds — ABNORMAL HIGH (ref 11.4–15.2)

## 2021-10-14 LAB — BRAIN NATRIURETIC PEPTIDE: B Natriuretic Peptide: 494.1 pg/mL — ABNORMAL HIGH (ref 0.0–100.0)

## 2021-10-14 LAB — RESP PANEL BY RT-PCR (FLU A&B, COVID) ARPGX2
Influenza A by PCR: NEGATIVE
Influenza B by PCR: NEGATIVE
SARS Coronavirus 2 by RT PCR: NEGATIVE

## 2021-10-14 LAB — RETICULOCYTES
Immature Retic Fract: 22.6 % — ABNORMAL HIGH (ref 2.3–15.9)
RBC.: 3.96 MIL/uL (ref 3.87–5.11)
Retic Count, Absolute: 92.5 10*3/uL (ref 19.0–186.0)
Retic Ct Pct: 2.4 % (ref 0.4–3.1)

## 2021-10-14 LAB — BLOOD GAS, VENOUS
Acid-base deficit: 1.8 mmol/L (ref 0.0–2.0)
Bicarbonate: 21.3 mmol/L (ref 20.0–28.0)
O2 Saturation: 82.2 %
Patient temperature: 37
pCO2, Ven: 30 mmHg — ABNORMAL LOW (ref 44–60)
pH, Ven: 7.46 — ABNORMAL HIGH (ref 7.25–7.43)
pO2, Ven: 52 mmHg — ABNORMAL HIGH (ref 32–45)

## 2021-10-14 LAB — ECHOCARDIOGRAM COMPLETE
AR max vel: 0.89 cm2
AV Area VTI: 0.88 cm2
AV Area mean vel: 0.86 cm2
AV Mean grad: 35.8 mmHg
AV Peak grad: 67.2 mmHg
Ao pk vel: 4.1 m/s
Area-P 1/2: 4.17 cm2
Height: 62 in
MV M vel: 3.85 m/s
MV Peak grad: 59.3 mmHg
S' Lateral: 2.6 cm
Weight: 2955.93 oz

## 2021-10-14 LAB — BLOOD CULTURE ID PANEL (REFLEXED) - BCID2

## 2021-10-14 LAB — GLUCOSE, CAPILLARY
Glucose-Capillary: 241 mg/dL — ABNORMAL HIGH (ref 70–99)
Glucose-Capillary: 168 mg/dL — ABNORMAL HIGH (ref 70–99)
Glucose-Capillary: 123 mg/dL — ABNORMAL HIGH (ref 70–99)
Glucose-Capillary: 172 mg/dL — ABNORMAL HIGH (ref 70–99)
Glucose-Capillary: 158 mg/dL — ABNORMAL HIGH (ref 70–99)
Glucose-Capillary: 157 mg/dL — ABNORMAL HIGH (ref 70–99)

## 2021-10-14 LAB — RENAL FUNCTION PANEL
Albumin: 2.9 g/dL — ABNORMAL LOW (ref 3.5–5.0)
Anion gap: 7 (ref 5–15)
BUN: 42 mg/dL — ABNORMAL HIGH (ref 8–23)
CO2: 22 mmol/L (ref 22–32)
Calcium: 9.1 mg/dL (ref 8.9–10.3)
Chloride: 112 mmol/L — ABNORMAL HIGH (ref 98–111)
Creatinine, Ser: 2.27 mg/dL — ABNORMAL HIGH (ref 0.44–1.00)
GFR, Estimated: 21 mL/min — ABNORMAL LOW (ref >60)
Glucose, Bld: 192 mg/dL — ABNORMAL HIGH (ref 70–99)
Phosphorus: 3.1 mg/dL (ref 2.5–4.6)
Potassium: 4.3 mmol/L (ref 3.5–5.1)
Sodium: 141 mmol/L (ref 135–145)

## 2021-10-14 LAB — CBC WITH DIFFERENTIAL/PLATELET
Abs Immature Granulocytes: 0.63 10*3/uL — ABNORMAL HIGH (ref 0.00–0.07)
Basophils Absolute: 0.1 10*3/uL (ref 0.0–0.1)
Basophils Relative: 0 %
Eosinophils Absolute: 0.4 10*3/uL (ref 0.0–0.5)
Eosinophils Relative: 2 %
HCT: 36 % (ref 36.0–46.0)
Hemoglobin: 10.7 g/dL — ABNORMAL LOW (ref 12.0–15.0)
Immature Granulocytes: 4 %
Lymphocytes Relative: 4 %
Lymphs Abs: 0.6 10*3/uL — ABNORMAL LOW (ref 0.7–4.0)
MCH: 26.8 pg (ref 26.0–34.0)
MCHC: 29.7 g/dL — ABNORMAL LOW (ref 30.0–36.0)
MCV: 90 fL (ref 80.0–100.0)
Monocytes Absolute: 0.9 10*3/uL (ref 0.1–1.0)
Monocytes Relative: 5 %
Neutro Abs: 13.9 10*3/uL — ABNORMAL HIGH (ref 1.7–7.7)
Neutrophils Relative %: 85 %
Platelets: 372 10*3/uL (ref 150–400)
RBC: 4 MIL/uL (ref 3.87–5.11)
RDW: 20.1 % — ABNORMAL HIGH (ref 11.5–15.5)
WBC: 16.5 10*3/uL — ABNORMAL HIGH (ref 4.0–10.5)
nRBC: 0 % (ref 0.0–0.2)

## 2021-10-14 LAB — URINALYSIS, ROUTINE W REFLEX MICROSCOPIC
Bilirubin Urine: NEGATIVE
Glucose, UA: NEGATIVE mg/dL
Hgb urine dipstick: NEGATIVE
Ketones, ur: NEGATIVE mg/dL
Nitrite: NEGATIVE
Protein, ur: NEGATIVE mg/dL
Specific Gravity, Urine: 1.014 (ref 1.005–1.030)
pH: 5 (ref 5.0–8.0)

## 2021-10-14 LAB — HEMOGLOBIN A1C
Hgb A1c MFr Bld: 5.6 % (ref 4.8–5.6)
Mean Plasma Glucose: 114.02 mg/dL

## 2021-10-14 LAB — IRON AND TIBC
Iron: 21 ug/dL — ABNORMAL LOW (ref 28–170)
Saturation Ratios: 9 % — ABNORMAL LOW (ref 10.4–31.8)
TIBC: 244 ug/dL — ABNORMAL LOW (ref 250–450)
UIBC: 223 ug/dL

## 2021-10-14 LAB — LACTIC ACID, PLASMA
Lactic Acid, Venous: 1.6 mmol/L (ref 0.5–1.9)
Lactic Acid, Venous: 2.5 mmol/L (ref 0.5–1.9)

## 2021-10-14 LAB — TROPONIN I (HIGH SENSITIVITY)
Troponin I (High Sensitivity): 19 ng/L — ABNORMAL HIGH (ref <18)
Troponin I (High Sensitivity): 23 ng/L — ABNORMAL HIGH (ref <18)

## 2021-10-14 LAB — MAGNESIUM: Magnesium: 1.9 mg/dL (ref 1.7–2.4)

## 2021-10-14 LAB — VITAMIN B12: Vitamin B-12: 917 pg/mL — ABNORMAL HIGH (ref 180–914)

## 2021-10-14 LAB — APTT: aPTT: 31 seconds (ref 24–36)

## 2021-10-14 LAB — MRSA NEXT GEN BY PCR, NASAL: MRSA by PCR Next Gen: DETECTED — AB

## 2021-10-14 LAB — PHOSPHORUS: Phosphorus: 3.2 mg/dL (ref 2.5–4.6)

## 2021-10-14 LAB — D-DIMER, QUANTITATIVE: D-Dimer, Quant: 0.29 ug/mL-FEU (ref 0.00–0.50)

## 2021-10-14 LAB — FOLATE: Folate: 8.6 ng/mL (ref >5.9)

## 2021-10-14 LAB — FERRITIN: Ferritin: 77 ng/mL (ref 11–307)

## 2021-10-14 MED ORDER — METRONIDAZOLE 500 MG/100ML IV SOLN
500.0000 mg | Freq: Two times a day (BID) | INTRAVENOUS | Status: DC
  Administered 2021-10-14: 500 mg via INTRAVENOUS
  Filled 2021-10-14: qty 100

## 2021-10-14 MED ORDER — LORATADINE 10 MG PO TABS
10.0000 mg | ORAL_TABLET | Freq: Every day | ORAL | Status: DC
  Administered 2021-10-14 – 2021-10-20 (×7): 10 mg via ORAL
  Filled 2021-10-14 (×7): qty 1

## 2021-10-14 MED ORDER — HEPARIN BOLUS VIA INFUSION
2000.0000 [IU] | Freq: Once | INTRAVENOUS | Status: AC
  Administered 2021-10-14: 2000 [IU] via INTRAVENOUS
  Filled 2021-10-14: qty 2000

## 2021-10-14 MED ORDER — NOREPINEPHRINE 4 MG/250ML-% IV SOLN: 0.0000 ug/min | INTRAVENOUS | Status: DC

## 2021-10-14 MED ORDER — HEPARIN (PORCINE) 25000 UT/250ML-% IV SOLN
1100.0000 [IU]/h | INTRAVENOUS | Status: DC
  Administered 2021-10-14: 1100 [IU]/h via INTRAVENOUS
  Filled 2021-10-14: qty 250

## 2021-10-14 MED ORDER — VANCOMYCIN HCL 750 MG/150ML IV SOLN: 750.0000 mg | INTRAVENOUS | Status: DC

## 2021-10-14 MED ORDER — CHLORHEXIDINE GLUCONATE CLOTH 2 % EX PADS: 6.0000 | MEDICATED_PAD | Freq: Every day | CUTANEOUS | Status: DC

## 2021-10-14 MED ORDER — HEPARIN SODIUM (PORCINE) 5000 UNIT/ML IJ SOLN
5000.0000 [IU] | Freq: Three times a day (TID) | INTRAMUSCULAR | Status: DC
  Administered 2021-10-14 – 2021-10-20 (×19): 5000 [IU] via SUBCUTANEOUS
  Filled 2021-10-14 (×18): qty 1

## 2021-10-14 MED ORDER — SODIUM CHLORIDE 0.9 % IV BOLUS
500.0000 mL | Freq: Once | INTRAVENOUS | Status: AC
  Administered 2021-10-14: 500 mL via INTRAVENOUS

## 2021-10-14 MED ORDER — CYANOCOBALAMIN 500 MCG PO TABS
500.0000 ug | ORAL_TABLET | Freq: Every day | ORAL | Status: DC
  Administered 2021-10-14 – 2021-10-20 (×7): 500 ug via ORAL
  Filled 2021-10-14 (×7): qty 1

## 2021-10-14 MED ORDER — LEVETIRACETAM 500 MG PO TABS
500.0000 mg | ORAL_TABLET | Freq: Two times a day (BID) | ORAL | Status: DC
  Administered 2021-10-14 – 2021-10-20 (×14): 500 mg via ORAL
  Filled 2021-10-14 (×14): qty 1

## 2021-10-14 MED ORDER — CHLORHEXIDINE GLUCONATE CLOTH 2 % EX PADS
6.0000 | MEDICATED_PAD | Freq: Every day | CUTANEOUS | Status: DC
  Administered 2021-10-14 – 2021-10-19 (×5): 6 via TOPICAL

## 2021-10-14 MED ORDER — VITAMIN C 500 MG PO TABS
500.0000 mg | ORAL_TABLET | Freq: Every day | ORAL | Status: DC
  Administered 2021-10-14 – 2021-10-20 (×7): 500 mg via ORAL
  Filled 2021-10-14 (×7): qty 1

## 2021-10-14 MED ORDER — PANTOPRAZOLE SODIUM 20 MG PO TBEC
20.0000 mg | DELAYED_RELEASE_TABLET | Freq: Every day | ORAL | Status: DC
  Administered 2021-10-14 – 2021-10-19 (×6): 20 mg via ORAL
  Filled 2021-10-14 (×7): qty 1

## 2021-10-14 MED ORDER — OMEGA-3 FATTY ACIDS 1000 MG PO CAPS
1000.0000 mg | ORAL_CAPSULE | Freq: Every day | ORAL | Status: DC
  Administered 2021-10-14: 1000 mg via ORAL
  Filled 2021-10-14 (×2): qty 1

## 2021-10-14 MED ORDER — MUPIROCIN 2 % EX OINT
1.0000 | TOPICAL_OINTMENT | Freq: Two times a day (BID) | CUTANEOUS | Status: AC
  Administered 2021-10-14 – 2021-10-19 (×10): 1 via NASAL
  Filled 2021-10-14 (×2): qty 22

## 2021-10-14 MED ORDER — PROCHLORPERAZINE EDISYLATE 10 MG/2ML IJ SOLN
10.0000 mg | Freq: Four times a day (QID) | INTRAMUSCULAR | Status: DC | PRN
Start: 2021-10-14 — End: 2021-10-20

## 2021-10-14 MED ORDER — SODIUM CHLORIDE 0.9 % IV BOLUS
250.0000 mL | INTRAVENOUS | Status: AC
  Administered 2021-10-14: 250 mL via INTRAVENOUS

## 2021-10-14 MED ORDER — SODIUM CHLORIDE 0.9 % IV SOLN
2.0000 g | INTRAVENOUS | Status: DC
  Administered 2021-10-14: 2 g via INTRAVENOUS
  Filled 2021-10-14: qty 12.5

## 2021-10-14 MED ORDER — LEVOTHYROXINE SODIUM 100 MCG PO TABS
100.0000 ug | ORAL_TABLET | Freq: Every day | ORAL | Status: DC
  Administered 2021-10-14 – 2021-10-20 (×7): 100 ug via ORAL
  Filled 2021-10-14 (×7): qty 1

## 2021-10-14 MED ORDER — ACETAMINOPHEN 325 MG PO TABS: 650.0000 mg | ORAL_TABLET | Freq: Four times a day (QID) | ORAL | Status: DC | PRN

## 2021-10-14 MED ORDER — MELATONIN 5 MG PO TABS
5.0000 mg | ORAL_TABLET | Freq: Every evening | ORAL | Status: DC | PRN
  Administered 2021-10-15: 5 mg via ORAL
  Filled 2021-10-14: qty 1

## 2021-10-14 MED ORDER — SODIUM CHLORIDE 0.9 % IV SOLN
250.0000 mL | INTRAVENOUS | Status: DC
  Administered 2021-10-14 – 2021-10-15 (×2): 250 mL via INTRAVENOUS

## 2021-10-14 MED ORDER — ORAL CARE MOUTH RINSE: 15.0000 mL | OROMUCOSAL | Status: DC | PRN

## 2021-10-14 MED ORDER — ASPIRIN 81 MG PO TBEC
81.0000 mg | DELAYED_RELEASE_TABLET | Freq: Every day | ORAL | Status: DC
Start: 2021-10-14 — End: 2021-10-15
  Administered 2021-10-14: 81 mg via ORAL
  Filled 2021-10-14 (×3): qty 1

## 2021-10-14 MED ORDER — INSULIN ASPART 100 UNIT/ML IJ SOLN
0.0000 [IU] | Freq: Three times a day (TID) | INTRAMUSCULAR | Status: DC
  Administered 2021-10-14: 2 [IU] via SUBCUTANEOUS
  Administered 2021-10-14: 1 [IU] via SUBCUTANEOUS
  Administered 2021-10-14: 2 [IU] via SUBCUTANEOUS
  Administered 2021-10-15: 1 [IU] via SUBCUTANEOUS
  Administered 2021-10-15 – 2021-10-17 (×6): 2 [IU] via SUBCUTANEOUS
  Administered 2021-10-17: 1 [IU] via SUBCUTANEOUS
  Administered 2021-10-17: 2 [IU] via SUBCUTANEOUS
  Administered 2021-10-18: 1 [IU] via SUBCUTANEOUS
  Administered 2021-10-18: 2 [IU] via SUBCUTANEOUS
  Administered 2021-10-18: 1 [IU] via SUBCUTANEOUS
  Administered 2021-10-19 – 2021-10-20 (×4): 2 [IU] via SUBCUTANEOUS

## 2021-10-14 MED ORDER — MIDODRINE HCL 5 MG PO TABS
10.0000 mg | ORAL_TABLET | ORAL | Status: AC
  Administered 2021-10-14: 10 mg via ORAL
  Filled 2021-10-14: qty 2

## 2021-10-14 MED ORDER — NOREPINEPHRINE 4 MG/250ML-% IV SOLN
2.0000 ug/min | INTRAVENOUS | Status: DC
  Administered 2021-10-14: 2 ug/min via INTRAVENOUS
  Filled 2021-10-14: qty 250

## 2021-10-14 MED ORDER — ALLOPURINOL 100 MG PO TABS
100.0000 mg | ORAL_TABLET | Freq: Two times a day (BID) | ORAL | Status: DC
  Administered 2021-10-14 – 2021-10-20 (×13): 100 mg via ORAL
  Filled 2021-10-14 (×13): qty 1

## 2021-10-14 MED ORDER — INSULIN ASPART 100 UNIT/ML IJ SOLN
0.0000 [IU] | Freq: Every day | INTRAMUSCULAR | Status: DC
  Administered 2021-10-19: 2 [IU] via SUBCUTANEOUS

## 2021-10-14 MED ORDER — PENICILLIN G POTASSIUM 20000000 UNITS IJ SOLR
6.0000 10*6.[IU] | Freq: Two times a day (BID) | INTRAVENOUS | Status: DC
  Administered 2021-10-14 – 2021-10-18 (×7): 6 10*6.[IU] via INTRAVENOUS
  Filled 2021-10-14 (×8): qty 6

## 2021-10-14 NOTE — Progress Notes (Signed)
Care started prior to midnight in the emergency room and patient was admitted early this morning by Dr. Irene Pap and I am in current agreement with her assessment and plan but additional changes to the plan of care have been made accordingly.  The patient is an 88 year old elderly obese female with a past medical history significant for but not limited to myeloproliferative neoplasm, essential hypertension, diabetes mellitus type 2, CKD stage IV, hypothyroidism, seizure disorder, hyperlipidemia, iron deficiency anemia, GERD, vitamin D deficiency as well as other comorbidities who presented to [facility] ED from her assisted living facility due to sudden onset shaking and chills.  She states that she was in her usual state of health prior to this but when her vitals were checked she was noted to have a temperature of 102 and EMS was called.  Upon arrival to the ED she was found to be hypoxic and was placed on 4 L supplemental oxygen via nasal cannula to maintain O2 saturation greater than 92%.  Patient was hypotensive and sepsis code was called and she was noted to have asymmetrical lower extremity edema which was worse on the left compared to the right.  There was initial concern for a PE so heparin drip was started but then was stopped by the critical care team.  Given that she continued to be hypotensive she was placed on pressors and critical care was consulted for further evaluation and management.  Currently she is being admitted and treated for the following but not limited to:  Acute hypoxic respiratory failure secondary to pulmonary edema versus others Not on oxygen supplementation at baseline, currently requiring 4 L to maintain O2 saturation greater than 92%. -Chest x-ray done on admission which shows cardiomegaly and mild pulmonary edema. -Has received IV fluid hydration in the ED, will hold off IV fluid for now. -Incentive spirometer -Mobilize as tolerated -Due to concern for pulmonary  embolism, started on heparin drip by EDP but now low so stopped  -D-dimer 0.29 and negative so heparin drip has been discontinued   Septic shock with unclear source of infection. -Meets SIRS criteria, WBC greater than 12 K, respiration rate 24, reported temperature of 102. -Hypotensive with MAP in the 50s despite IV fluid bolus, midodrine challenge, and IV fluid hydration. -Started vasopressor, Levophed -PCCM, Consulted  -UA showed yellow color urine with small leukocytosis, negative nitrites, few bacteria, 0-5 RBCs per high-powered field, and 11-20 WBCs with urine culture pending and blood cultures pending -Follow blood cultures Monitor fever curve and WBC -Continue broad-spectrum IV antibiotics for now, cefepime, IV Flagyl and IV vancomycin- MRSA screening test is pending. -De-escalate antibiotics when able.   Acute on Chronic Diastolic CHF -BNP 244, cardiomegaly and pulm edema on chest x-ray -Bilateral lower extremity edema -Hold off diuretics for now due to hypotension -Follow 2D echo and pending -Start strict I's and O's and daily weight   Essential hypertension, currently hypotensive -Requiring vasopressor to maintain MAP greater than 65 -Hold off home oral antihypertensives. -Continue to closely monitor vital signs -Appreciate PCCM as a stent   CKD 4 -At baseline -Patient's BUNs/creatinine went from 39/2.12 is now 42/2.27 -Avoid nephrotoxic agents and hypotension -Monitor urine output -Avoid further nephrotoxic medications, contrast dyes, hypotension and dehydration to ensure adequate renal perfusion   Type 2 diabetes with hyperglycemia -Obtain hemoglobin A1c and was 5.6 -Start insulin sliding scale -Continue monitor blood sugars per protocol  Normocytic anemia -Recommendation hemoglobin/hematocrit went from 10.9/36.6 is now 10.7/36.0 -Check anemia panel in the a.m. -  Continue to monitor for signs and symptoms of bleeding; no overt bleeding noted -Repeat CBC in a.m.    Seizure disorder -Resume home AEDs -Continue with seizure precautions   Hypothyroidism -Resume home levothyroxine -Last TSH 3.30 on 05/14/2021.   Ambulatory dysfunction -Uses a walker to ambulate -Fall precautions -We will need PT and OT to further evaluate and treat   We will continue to monitor the patient's clinical response to intervention and repeat blood work and imaging in the a.m.

## 2021-10-14 NOTE — Progress Notes (Signed)
ANTICOAGULATION CONSULT NOTE - Initial Consult  Pharmacy Consult for heparin Indication: pulmonary embolus  Allergies  Allergen Reactions   Ppd [Tuberculin Purified Protein Derivative] Other (See Comments)    indurated    Patient Measurements: Weight: 82.9 kg (182 lb 12.2 oz) Heparin Dosing Weight: 82.9kg   Vital Signs: Temp: 100 F (37.8 C) (08/26 2319) Temp Source: Oral (08/26 2319) BP: 104/38 (08/27 0130) Pulse Rate: 87 (08/27 0130)  Labs: Recent Labs    10/13/21 2352  HGB 10.9*  HCT 36.6  PLT 351  APTT 31  LABPROT 15.9*  INR 1.3*  CREATININE 2.12*  TROPONINIHS 19*    Estimated Creatinine Clearance: 19 mL/min (A) (by C-G formula based on SCr of 2.12 mg/dL (H)).   Medical History: Past Medical History:  Diagnosis Date   Anemia, unspecified    Arthritis    "knees; right shoulder" (09/15/2013)   Basal cell carcinoma    "right upper outer lip"   DM neuropathy, type II diabetes mellitus ([place])    Dyspnea 2021   with low iron   GERD (gastroesophageal reflux disease)    Gout    High cholesterol    Hypertension    Hypothyroidism    Meniere's disease    Obesity (BMI 30-39.9)    Other forms of epilepsy and recurrent seizures without mention of intractable epilepsy 11/04/2012   Non convulsive paroxysmal spells, responding to [facility], patient not driving.    Pneumonia ~ 1943   Skin cancer    "forehead; right hand"   Stage 4 chronic kidney disease ([place])    UTI (urinary tract infection)    Vitamin D deficiency      Assessment:  88 y.o. female wo arrives via EMS with a fever and hypoxia. She has a pmh of IDDM, unspecified anemia, reflux, hypertension, hyperlipidemia, hypothyroidism, stage IV kidney disease , seizure disorder on Keppra, aortic valve stenosis.  Pharmacy consulted to dose heparin drip for suspected PE.  No prior AC noted  aPTT 31, PT 15.9, INR 1.3, Hgb 10.9, plts 351 Scr 2.12   Goal of Therapy:  Heparin level 0.3-0.7 units/ml Monitor  platelets by anticoagulation protocol: Yes   Plan:  Heparin bolus 2000 units x 1 Start heparin drip at 1100 units/hr Heparin level in 8 hours Daily CBC   Dolly Rias RPh 10/14/2021, 1:54 AM

## 2021-10-14 NOTE — Progress Notes (Signed)
PHARMACY NOTE:  ANTIMICROBIAL RENAL DOSAGE ADJUSTMENT  Current antimicrobial regimen includes a mismatch between antimicrobial dosage and estimated renal function.  As per policy approved by the Pharmacy & Therapeutics and Medical Executive Committees, the antimicrobial dosage will be adjusted accordingly.  Current antimicrobial dosage:  vancomycin '750mg'$  IV q24h  Indication: sepsis  Renal Function:  Estimated Creatinine Clearance: 19 mL/min (A) (by C-G formula based on SCr of 2.12 mg/dL (H)). '[]'$      On intermittent HD, scheduled: '[]'$      On CRRT    Antimicrobial dosage has been changed to:  vancomycin '750mg'$  IV q48h (AUC 535.2, Scr 2.12) Pt received vanc 1gm x 1 in ED  Additional comments:   Thank you for allowing pharmacy to be a part of this patient's care.  Dolly Rias RPh 10/14/2021, 2:10 AM

## 2021-10-14 NOTE — ED Notes (Signed)
ED TO INPATIENT HANDOFF REPORT  Name/Age/Gender [name] [name] 88 y.o. female  Code Status Code Status History     Date Active Date Inactive Code Status Order ID Comments User Context   04/03/2020 2831 04/06/2020 2018 DNR [id number]  Flora Lipps, MD ED   11/02/2019 1511 11/04/2019 1920 Partial Code [id number]  Karmen Bongo, MD ED   09/15/2013 2320 09/20/2013 1716 Full Code [id number]  Theressa Millard, MD ED    Questions for Most Recent Historical Code Status (Order [id number])     Question Answer   In the event of cardiac or respiratory ARREST Do not call a "code blue"   In the event of cardiac or respiratory ARREST Do not perform Intubation, CPR, defibrillation or ACLS   In the event of cardiac or respiratory ARREST Use medication by any route, position, wound care, and other measures to relive pain and suffering. May use oxygen, suction and manual treatment of airway obstruction as needed for comfort.            Home/SNF/Other Nursing Home  Chief Complaint Acute respiratory failure with hypoxia (HCC) [J96.01]  Level of Care/Admitting Diagnosis ED Disposition     ED Disposition  Admit   Condition  --   Comment  Hospital Area: [facility] [[id number]]  Level of Care: Stepdown [14]  Admit to SDU based on following criteria: Hemodynamic compromise or significant risk of instability:  Patient requiring short term acute titration and management of vasoactive drips, and invasive monitoring (i.e., CVP and Arterial line).  May [facility] patient to [facility] or [facility] if equivalent level of care is available:: Yes  Covid Evaluation: Asymptomatic - no recent exposure (last 10 days) testing not required  Diagnosis: Acute respiratory failure with hypoxia [facility]) [[id number]]  Admitting Physician: Kayleen Memos [[id number]]  Attending Physician: Kayleen Memos [[id number]]  Certification:: I certify this patient will need inpatient services for at least 2 midnights   Estimated Length of Stay: 2          Medical History Past Medical History:  Diagnosis Date   Anemia, unspecified    Arthritis    "knees; right shoulder" (09/15/2013)   Basal cell carcinoma    "right upper outer lip"   DM neuropathy, type II diabetes mellitus ([place])    Dyspnea 2021   with low iron   GERD (gastroesophageal reflux disease)    Gout    High cholesterol    Hypertension    Hypothyroidism    Meniere's disease    Obesity (BMI 30-39.9)    Other forms of epilepsy and recurrent seizures without mention of intractable epilepsy 11/04/2012   Non convulsive paroxysmal spells, responding to [facility], patient not driving.    Pneumonia ~ 1943   Skin cancer    "forehead; right hand"   Stage 4 chronic kidney disease ([place])    UTI (urinary tract infection)    Vitamin D deficiency     Allergies Allergies  Allergen Reactions   Ppd [Tuberculin Purified Protein Derivative] Other (See Comments)    indurated    IV Location/Drains/Wounds Patient Lines/Drains/Airways Status     Active Line/Drains/Airways     Name Placement date Placement time Site Days   Peripheral IV 10/13/21 20 G Anterior;Right Forearm 10/13/21  2348  Forearm  1            Labs/Imaging Results for orders placed or performed during the hospital encounter of 10/13/21 (from the past 48 hour(s))  Lactic acid, plasma  Status: Abnormal   Collection Time: 10/13/21 11:52 PM  Result Value Ref Range   Lactic Acid, Venous 2.5 (HH) 0.5 - 1.9 mmol/L    Comment: CRITICAL RESULT CALLED TO, READ BACK BY AND VERIFIED WITH RN Buford Dresser AT 1601 10/14/21 CRUICKSHANK A Performed at [facility], [place] [address] [place], [place] [address]   Comprehensive metabolic panel     Status: Abnormal   Collection Time: 10/13/21 11:52 PM  Result Value Ref Range   Sodium 139 135 - 145 mmol/L   Potassium 4.3 3.5 - 5.1 mmol/L   Chloride 108 98 - 111 mmol/L   CO2 21 (L) 22 - 32 mmol/L   Glucose, Bld 278 (H) 70 -  99 mg/dL    Comment: Glucose reference range applies only to samples taken after fasting for at least 8 hours.   BUN 39 (H) 8 - 23 mg/dL   Creatinine, Ser 2.12 (H) 0.44 - 1.00 mg/dL   Calcium 9.2 8.9 - 10.3 mg/dL   Total Protein 5.9 (L) 6.5 - 8.1 g/dL   Albumin 3.3 (L) 3.5 - 5.0 g/dL   AST 27 15 - 41 U/L   ALT 17 0 - 44 U/L   Alkaline Phosphatase 65 38 - 126 U/L   Total Bilirubin 0.9 0.3 - 1.2 mg/dL   GFR, Estimated 22 (L) >60 mL/min    Comment: (NOTE) Calculated using the CKD-EPI Creatinine Equation (2021)    Anion gap 10 5 - 15    Comment: Performed at [facility], [place] [address] [place], [place] [address]  CBC with Differential     Status: Abnormal   Collection Time: 10/13/21 11:52 PM  Result Value Ref Range   WBC 12.5 (H) 4.0 - 10.5 K/uL   RBC 4.08 3.87 - 5.11 MIL/uL   Hemoglobin 10.9 (L) 12.0 - 15.0 g/dL   HCT 36.6 36.0 - 46.0 %   MCV 89.7 80.0 - 100.0 fL   MCH 26.7 26.0 - 34.0 pg   MCHC 29.8 (L) 30.0 - 36.0 g/dL   RDW 20.1 (H) 11.5 - 15.5 %   Platelets 351 150 - 400 K/uL   nRBC 0.0 0.0 - 0.2 %   Neutrophils Relative % 87 %   Neutro Abs 10.9 (H) 1.7 - 7.7 K/uL   Lymphocytes Relative 2 %   Lymphs Abs 0.3 (L) 0.7 - 4.0 K/uL   Monocytes Relative 5 %   Monocytes Absolute 0.6 0.1 - 1.0 K/uL   Eosinophils Relative 3 %   Eosinophils Absolute 0.4 0.0 - 0.5 K/uL   Basophils Relative 0 %   Basophils Absolute 0.0 0.0 - 0.1 K/uL   Immature Granulocytes 3 %   Abs Immature Granulocytes 0.32 (H) 0.00 - 0.07 K/uL    Comment: Performed at [facility], [place] [address], [place], [place] [address]  Protime-INR     Status: Abnormal   Collection Time: 10/13/21 11:52 PM  Result Value Ref Range   Prothrombin Time 15.9 (H) 11.4 - 15.2 seconds   INR 1.3 (H) 0.8 - 1.2    Comment: (NOTE) INR goal varies based on device and disease states. Performed at [facility], [place] [address] [place], [place] [address]   APTT     Status:  None   Collection Time: 10/13/21 11:52 PM  Result Value Ref Range   aPTT 31 24 - 36 seconds    Comment: Performed at [facility], [place] [address] [place],  [place] [address]  Troponin I (High Sensitivity)     Status: Abnormal   Collection Time: 10/13/21 11:52 PM  Result Value Ref Range   Troponin I (High Sensitivity) 19 (H) <18 ng/L    Comment: (NOTE) Elevated high sensitivity troponin I (hsTnI) values and significant  changes across serial measurements may suggest ACS but many other  chronic and acute conditions are known to elevate hsTnI results.  Refer to the "Links" section for chest pain algorithms and additional  guidance. Performed at [facility], [place] [address], [place], [place] [address]   Brain natriuretic peptide     Status: Abnormal   Collection Time: 10/13/21 11:55 PM  Result Value Ref Range   B Natriuretic Peptide 494.1 (H) 0.0 - 100.0 pg/mL    Comment: Performed at [facility], [place] [address], [place], [place] [address]  Blood gas, venous ([facility], [facility], [facility])     Status: Abnormal   Collection Time: 10/14/21 12:00 AM  Result Value Ref Range   pH, Ven 7.46 (H) 7.25 - 7.43   pCO2, Ven 30 (L) 44 - 60 mmHg   pO2, Ven 52 (H) 32 - 45 mmHg   Bicarbonate 21.3 20.0 - 28.0 mmol/L   Acid-base deficit 1.8 0.0 - 2.0 mmol/L   O2 Saturation 82.2 %   Patient temperature 37.0     Comment: Performed at [facility], [place] [address], [place], [place] [address]  Resp Panel by RT-PCR (Flu A&B, Covid) Anterior Nasal Swab     Status: None   Collection Time: 10/14/21 12:50 AM   Specimen: Anterior Nasal Swab  Result Value Ref Range   SARS Coronavirus 2 by RT PCR NEGATIVE NEGATIVE    Comment: (NOTE) SARS-CoV-2 target nucleic acids are NOT DETECTED.  The SARS-CoV-2 RNA is generally detectable in upper respiratory specimens during the acute phase of infection. The lowest concentration of SARS-CoV-2 viral copies this  assay can detect is 138 copies/mL. A negative result does not preclude SARS-Cov-2 infection and should not be used as the sole basis for treatment or other patient management decisions. A negative result may occur with  improper specimen collection/handling, submission of specimen other than nasopharyngeal swab, presence of viral mutation(s) within the areas targeted by this assay, and inadequate number of viral copies(<138 copies/mL). A negative result must be combined with clinical observations, patient history, and epidemiological information. The expected result is Negative.  Fact Sheet for Patients:  EntrepreneurPulse.com.au  Fact Sheet for Healthcare Providers:  IncredibleEmployment.be  This test is no t yet approved or cleared by the [place] FDA and  has been authorized for detection and/or diagnosis of SARS-CoV-2 by FDA under an Emergency Use Authorization (EUA). This EUA will remain  in effect (meaning this test can be used) for the duration of the COVID-19 declaration under Section 564(b)(1) of the Act, 21 U.S.C.section 360bbb-3(b)(1), unless the authorization is terminated  or revoked sooner.       Influenza A by PCR NEGATIVE NEGATIVE   Influenza B by PCR NEGATIVE NEGATIVE    Comment: (NOTE) The Xpert Xpress SARS-CoV-2/FLU/RSV plus assay is intended as an aid in the diagnosis of influenza from Nasopharyngeal swab specimens and should not be used as a sole basis for treatment. Nasal washings and aspirates are unacceptable for Xpert Xpress SARS-CoV-2/FLU/RSV testing.  Fact Sheet for Patients: EntrepreneurPulse.com.au  Fact Sheet for Healthcare Providers: IncredibleEmployment.be  This test is not yet approved or cleared by the [place] FDA and has been  authorized for detection and/or diagnosis of SARS-CoV-2 by FDA under an Emergency Use Authorization (EUA). This EUA will remain in  effect (meaning this test can be used) for the duration of the COVID-19 declaration under Section 564(b)(1) of the Act, 21 U.S.C. section 360bbb-3(b)(1), unless the authorization is terminated or revoked.  Performed at [facility], [place] [address] [place], [place] [address]   Lactic acid, plasma     Status: None   Collection Time: 10/14/21  1:44 AM  Result Value Ref Range   Lactic Acid, Venous 1.6 0.5 - 1.9 mmol/L    Comment: Performed at [facility], [place] [address] [place], [place] [address]  Troponin I (High Sensitivity)     Status: Abnormal   Collection Time: 10/14/21  1:44 AM  Result Value Ref Range   Troponin I (High Sensitivity) 23 (H) <18 ng/L    Comment: (NOTE) Elevated high sensitivity troponin I (hsTnI) values and significant  changes across serial measurements may suggest ACS but many other  chronic and acute conditions are known to elevate hsTnI results.  Refer to the "Links" section for chest pain algorithms and additional  guidance. Performed at [facility], [place] [address] [place], [place] [address]    DG Chest Port 1 View  Result Date: 10/14/2021 CLINICAL DATA:  Questionable sepsis - evaluate for abnormality EXAM: PORTABLE CHEST 1 VIEW COMPARISON:  04/03/2020 FINDINGS: Cardiomegaly, vascular congestion. No overt edema. No confluent opacities or effusions. No acute bony abnormality. Aortic atherosclerosis. IMPRESSION: Cardiomegaly, vascular congestion Electronically Signed   By: Rolm Baptise M.D.   On: 10/14/2021 00:29    Pending Labs Unresulted Labs (From admission, onward)     Start     Ordered   10/15/21 0500  CBC  Daily at 5am,   R      10/14/21 0159   10/13/21 2333  Blood Culture (routine x 2)  (Septic presentation on arrival (screening labs, nursing and treatment orders for obvious sepsis))  BLOOD CULTURE X 2,   STAT      10/13/21 2337   10/13/21 2333  Urinalysis, Routine w reflex microscopic   (Septic presentation on arrival (screening labs, nursing and treatment orders for obvious sepsis))  ONCE - URGENT,   URGENT        10/13/21 2337   10/13/21 2333  Urine Culture  (Septic presentation on arrival (screening labs, nursing and treatment orders for obvious sepsis))  ONCE - URGENT,   URGENT       Question:  Indication  Answer:  Dysuria   10/13/21 2337            Vitals/Pain Today's Vitals   10/13/21 2321 10/13/21 2345 10/14/21 0130 10/14/21 0145  BP:  (!) 113/32 (!) 104/38 (!) 107/40  Pulse:  88 87 88  Resp:  (!) 25 (!) 22 (!) 22  Temp:      TempSrc:      SpO2:  91% 93% 93%  Weight:   82.9 kg   PainSc: 0-No pain       Isolation Precautions No active isolations  Medications Medications  lactated ringers infusion (0 mLs Intravenous Paused 10/14/21 0012)  heparin bolus via infusion 2,000 Units (has no administration in time range)  heparin ADULT infusion 100 units/mL (25000 units/210m) (has no administration in time range)  ceFEPIme (MAXIPIME) 2 g in sodium chloride 0.9 % 100 mL IVPB (has no administration in time range)  metroNIDAZOLE (FLAGYL) IVPB 500 mg (has no administration  in time range)  vancomycin (VANCOREADY) IVPB 750 mg/150 mL (has no administration in time range)  ceFEPIme (MAXIPIME) 2 g in sodium chloride 0.9 % 100 mL IVPB (0 g Intravenous Stopped 10/14/21 0046)  metroNIDAZOLE (FLAGYL) IVPB 500 mg (0 mg Intravenous Stopped 10/14/21 0255)  vancomycin (VANCOCIN) IVPB 1000 mg/200 mL premix (0 mg Intravenous Stopped 10/14/21 0255)    Mobility non-ambulatory

## 2021-10-14 NOTE — Consult Note (Signed)
NAME:  [name] [name], MRN:  [id number], DOB:  [birth year], LOS: 0 ADMISSION DATE:  10/13/2021, CONSULTATION DATE:  10/14/21 REFERRING MD:  Francia Greaves , CHIEF COMPLAINT:  B leg pain, fever  History of Present Illness:  88 yo woman with hx of  Multiple medical problems, sent from her assisted living facility with Fever and hypoxia.  Temp was 100 on admission here.  Initially on evaluation she said she felt very cold.   B le edema  (L>R) unchanged.    In ED was started on Vanc, flagyl, and cefepime.  Also started on heparin infusion for possible PE by ED.   Lr 150per hour started midnight - 5 am.  NS 250 cc bolus given.  I dont see that any other fluids were given.    Cr. 2.12 baseline around 2 BNP 494 Lact 2.5 --> 1.6 Wbc 12.5  UA + leuks + WBC ("packed") Bl cx pending.    Currently no complaints.  No SOB.     Pertinent  Medical History  Basal cell carcinoma DM2  GERD Gout HLD HTN Hypothyroidism Menieres disease Hx of "non convulsive paroxysmal spells, responds to keppra" CKD 4  Aortic stenosis   Significant Hospital Events: Including procedures, antibiotic start and stop dates in addition to other pertinent events     Interim History / Subjective:    Objective   Blood pressure (!) 97/32, pulse 79, temperature 98.2 F (36.8 C), temperature source Oral, resp. rate 20, height 5' 2" (1.575 m), weight 83.8 kg, SpO2 97 %.       No intake or output data in the 24 hours ending 10/14/21 0529 Filed Weights   10/14/21 0130 10/14/21 0350  Weight: 82.9 kg 83.8 kg    Examination: General: NAD, pleasant HENT: ncat Lungs: CTAB Cardiovascular: RRR, 4/6 systolic murmur  Abdomen: nt, nd, nbs  Extremities: B 2+ pitting edema  Neuro: Alert and oriented x 3, appropriate   CXR IMPRESSION: Cardiomegaly, vascular congestion  Echo 2015 Left ventricle: The cavity size was normal. Wall thickness was    increased in a pattern of moderate LVH. There was mild concentric     hypertrophy. Systolic function was normal. The estimated ejection    fraction was in the range of 60% to 65%. Wall motion was normal;    there were no regional wall motion abnormalities.  - Aortic valve: Mildly thickened, mildly calcified leaflets. Valve    area (VTI): 1.3 cm^2. Valve area (Vmax): 1.29 cm^2.  - Pulmonary arteries: Systolic pressure was mildly increased. PA    peak pressure: 36 mm Hg (S).    Resolved Hospital Problem list     Assessment & Plan:  88 yo woman with MMP, here with hypotension, low grade fever, chills, WBC in UA.  Sepsis: wide pulse pressure.  Cont levophed, map goal 65.  Looks like she could tolerate a small bolus now.  No resp distress and small pulm congestion on echo.   Hypoxemia: likely 2/2 pulmonary congestion.  Nothing other than hypoxia to suggest PE at this point.  LE [place] ordered but edema is chronic.  Will d/c heparin for now.  DM2  GERD Gout HLD HTN Hypothyroidism Menieres disease Hx of "non convulsive paroxysmal spells, responds to keppra" CKD 4  Aortic stenosis  Best Practice (right click and "Reselect all SmartList Selections" daily)   Diet/type: Regular consistency (see orders) DVT prophylaxis: prophylactic heparin  GI prophylaxis: PPI Lines: N/A Foley:  N/A Code Status:  DNR Last date of  multidisciplinary goals of care discussion [8/27 discussed with patient and informed her son of her DNR status.  ]  Labs   CBC: Recent Labs  Lab 10/13/21 2352  WBC 12.5*  NEUTROABS 10.9*  HGB 10.9*  HCT 36.6  MCV 89.7  PLT 836    Basic Metabolic Panel: Recent Labs  Lab 10/13/21 2352  NA 139  K 4.3  CL 108  CO2 21*  GLUCOSE 278*  BUN 39*  CREATININE 2.12*  CALCIUM 9.2   GFR: Estimated Creatinine Clearance: 19.1 mL/min (A) (by C-G formula based on SCr of 2.12 mg/dL (H)). Recent Labs  Lab 10/13/21 2352 10/14/21 0144  WBC 12.5*  --   LATICACIDVEN 2.5* 1.6    Liver Function Tests: Recent Labs  Lab 10/13/21 2352  AST 27   ALT 17  ALKPHOS 65  BILITOT 0.9  PROT 5.9*  ALBUMIN 3.3*   No results for input(s): "LIPASE", "AMYLASE" in the last 168 hours. No results for input(s): "AMMONIA" in the last 168 hours.  ABG    Component Value Date/Time   HCO3 21.3 10/14/2021 0000   TCO2 21 05/26/2008 1908   ACIDBASEDEF 1.8 10/14/2021 0000   O2SAT 82.2 10/14/2021 0000     Coagulation Profile: Recent Labs  Lab 10/13/21 2352  INR 1.3*    Cardiac Enzymes: No results for input(s): "CKTOTAL", "CKMB", "CKMBINDEX", "TROPONINI" in the last 168 hours.  HbA1C: Hgb A1c MFr Bld  Date/Time Value Ref Range Status  05/14/2021 10:29 AM 6.1 (H) <5.7 % of total Hgb Final    Comment:    For someone without known diabetes, a hemoglobin  A1c value between 5.7% and 6.4% is consistent with prediabetes and should be confirmed with a  follow-up test. . For someone with known diabetes, a value <7% indicates that their diabetes is well controlled. A1c targets should be individualized based on duration of diabetes, age, comorbid conditions, and other considerations. . This assay result is consistent with an increased risk of diabetes. . Currently, no consensus exists regarding use of hemoglobin A1c for diagnosis of diabetes for children. .   02/07/2021 12:41 PM 5.8 (H) <5.7 % of total Hgb Final    Comment:    For someone without known diabetes, a hemoglobin  A1c value between 5.7% and 6.4% is consistent with prediabetes and should be confirmed with a  follow-up test. . For someone with known diabetes, a value <7% indicates that their diabetes is well controlled. A1c targets should be individualized based on duration of diabetes, age, comorbid conditions, and other considerations. . This assay result is consistent with an increased risk of diabetes. . Currently, no consensus exists regarding use of hemoglobin A1c for diagnosis of diabetes for children. .     CBG: Recent Labs  Lab 10/14/21 0357  GLUCAP  241*    Review of Systems:   Review of Systems  Constitutional:  Positive for chills and fever.  HENT:  Negative for hearing loss and tinnitus.   Eyes:  Negative for blurred vision.  Respiratory:  Negative for cough, hemoptysis and shortness of breath.   Cardiovascular:  Positive for chest pain and leg swelling. Negative for palpitations.  Gastrointestinal:  Negative for nausea.  Genitourinary:  Negative for dysuria.  Musculoskeletal:  Negative for myalgias.  Skin:  Negative for rash.  Neurological:  Negative for dizziness.  Endo/Heme/Allergies:  Does not bruise/bleed easily.  Psychiatric/Behavioral:  Negative for depression.      Past Medical History:  She,  has a past  medical history of Anemia, unspecified, Arthritis, Basal cell carcinoma, DM neuropathy, type II diabetes mellitus ([place]), Dyspnea (2021), GERD (gastroesophageal reflux disease), Gout, High cholesterol, Hypertension, Hypothyroidism, Meniere's disease, Obesity (BMI 30-39.9), Other forms of epilepsy and recurrent seizures without mention of intractable epilepsy (11/04/2012), Pneumonia (~ 1943), Skin cancer, Stage 4 chronic kidney disease ([place]), UTI (urinary tract infection), and Vitamin D deficiency.   Surgical History:   Past Surgical History:  Procedure Laterality Date   BIOPSY  04/05/2020   Procedure: BIOPSY;  Surgeon: Jerene Bears, MD;  Location: [facility];  Service: Gastroenterology;;   CERVICAL POLYPECTOMY     COLONOSCOPY WITH PROPOFOL N/A 04/06/2020   Procedure: COLONOSCOPY WITH PROPOFOL;  Surgeon: Jerene Bears, MD;  Location: [facility];  Service: Gastroenterology;  Laterality: N/A;   ESOPHAGOGASTRODUODENOSCOPY (EGD) WITH PROPOFOL N/A 04/05/2020   Procedure: ESOPHAGOGASTRODUODENOSCOPY (EGD) WITH PROPOFOL;  Surgeon: Jerene Bears, MD;  Location: [facility];  Service: Gastroenterology;  Laterality: N/A;   HAMMER TOE SURGERY Left 1980's   MOHS SURGERY  20087   "right upper outer lip"   POLYPECTOMY  04/06/2020    Procedure: POLYPECTOMY;  Surgeon: Jerene Bears, MD;  Location: [facility];  Service: Gastroenterology;;   TONSILLECTOMY  ~ [place] Left ~ 1950   "ran arm thru window"     Social History:   reports that she quit smoking about 38 years ago. Her smoking use included cigarettes. She has a 30.00 pack-year smoking history. She has never used smokeless tobacco. She reports current alcohol use. She reports that she does not use drugs.   Family History:  Her family history includes Alcohol abuse in her son; Diabetes in her son; Heart disease in her father and mother; Hypertension in her son and son; Ovarian cancer in her sister; Stroke in her mother.   Allergies Allergies  Allergen Reactions   Ppd [Tuberculin Purified Protein Derivative] Other (See Comments)    indurated     Home Medications  Prior to Admission medications   Medication Sig Start Date End Date Taking? Authorizing Provider  acetaminophen (TYLENOL) 500 MG tablet Take 1,000 mg by mouth every 6 (six) hours as needed for moderate pain.   Yes [provider]  allopurinol (ZYLOPRIM) 100 MG tablet Take 1 tablet (100 mg total) by mouth 2 (two) times daily. Take  1 tablet  Daily  to prevent Gout. 02/07/21  Yes Alycia Rossetti, NP  aspirin 81 MG tablet Take 81 mg by mouth daily.   Yes [provider]  atenolol (TENORMIN) 50 MG tablet TAKE ONE TABLET BY MOUTH DAILY GENERIC EQUIVALENT FOR TENORMIN Patient taking differently: Take 50 mg by mouth daily. 06/06/21  Yes [place], Kenney Houseman, NP  cholecalciferol (VITAMIN D3) 25 MCG (1000 UNIT) tablet Take 3,000 Units by mouth daily.   Yes [provider]  fish oil-omega-3 fatty acids 1000 MG capsule Take 1,000 mg by mouth at bedtime.   Yes [provider]  furosemide (LASIX) 40 MG tablet TAKE 1 TABLET BY MOUTH DAILY FOR FLUID RETENTION AND ANKLE SWELLING Patient taking differently: Take 40 mg by mouth daily. 03/21/21  Yes Alycia Rossetti, NP   HUMULIN 70/30 (70-30) 100 UNIT/ML injection INJECT 20 UNITS UNDER THE SKIN TWICE DAILY WITH MEALS Patient taking differently: Inject 20 Units into the skin 2 (two) times daily with a meal. 06/25/21  Yes Alycia Rossetti, NP  lansoprazole (PREVACID) 30 MG capsule TAKE 1 CAPSULE BY MOUTH DAILY TO PREVENT HEARTBURN AND INDIGESTION.  Patient taking differently: Take 30 mg by mouth daily. 12/19/20  Yes Alycia Rossetti, NP  levETIRAcetam (KEPPRA) 500 MG tablet Take  1 tablet 2  x /day  with Meals  to Prevent Seizures 07/10/21  Yes Unk Pinto, MD  levothyroxine (SYNTHROID) 100 MCG tablet TAKE 1 TABLET BY MOUTH DAILY ON AN EMPTY STOMACH WITH ONLY WATER FOR 30 MINUTES. NO ANTACIDS, CALCIUM OR MAGNESIUM FOR 4 HOURS. AVOID BIOTIN Patient taking differently: Take 100 mcg by mouth daily before breakfast. 11/29/20  Yes Alycia Rossetti, NP  loratadine (CLARITIN) 10 MG tablet Take 10 mg by mouth daily.   Yes [provider]  meclizine (ANTIVERT) 25 MG tablet Take   1 tablet 2 to 3 x /day  ONLY  if needed for Dizziness /Vertigo 07/10/21  Yes Unk Pinto, MD  rOPINIRole (REQUIP) 1 MG tablet Take  1/2 to 1 tablet  3 x /day  as needed for Restless Legs & Cramps / Patient knows to take by mouth 10/17/20  Yes Unk Pinto, MD  vitamin B-12 (CYANOCOBALAMIN) 100 MCG tablet Take 500 mcg by mouth daily.   Yes [provider]  vitamin C (ASCORBIC ACID) 500 MG tablet Take 500 mg by mouth daily.   Yes [provider]  BD VEO INSULIN SYRINGE U/F 31G X 15/64" 0.3 ML MISC 2 (two) times daily. 06/18/21   [provider]  Blood Glucose Monitoring Suppl (ONETOUCH VERIO) w/Device KIT Check  Blood Sugar   4 x /day before Meals & Bedtime   (( Dx-  e11.22 )) 11/03/20   Unk Pinto, MD  doxycycline (VIBRA-TABS) 100 MG tablet Take 1 tablet (100 mg total) by mouth 2 (two) times daily. Patient not taking: Reported on 10/13/2021 07/02/21   Newt Minion, MD  glucose blood [facility] VERIO)  test strip Check Blood Sugar 4 x /day  (( Dx-  e11.22 )) 11/03/20   Unk Pinto, MD  hydrocortisone (ANUSOL-HC) 2.5 % rectal cream Place rectally 2 (two) times daily. Patient not taking: Reported on 10/13/2021 04/06/20   Kayleen Memos, DO  Lancets [facility] ULTRASOFT) lancets Check Blood Sugar  4 x /day  before Meals & Bedtime   (( Dx-  e11.22 )) 11/03/20   Unk Pinto, MD     Critical care time: 45 min

## 2021-10-14 NOTE — Progress Notes (Signed)
An USGPIV (ultrasound guided PIV) has been placed for short-term vasopressor infusion. A correctly placed ivWatch must be used when administering Vasopressors. Should this treatment be needed beyond 72 hours, central line access should be obtained.  It will be the responsibility of the bedside nurse to follow best practice to prevent extravasations.   ?

## 2021-10-14 NOTE — H&P (Addendum)
History and Physical  STELLAR GENSEL YOV:[id number] DOB: 28-Feb-1932 DOA: 10/13/2021  Referring physician: Spero Curb  PCP: Unk Pinto, MD  Outpatient Specialists: Medical oncology, orthopedic surgery. Patient coming from: Assisted living facility  Chief Complaint: Shaking chills  HPI: [name] [name] is a 88 y.o. female with medical history significant for myeloproliferative neoplasm, essential hypertension, type 2 diabetes, CKD 4, hypothyroidism, seizure disorder, hyperlipidemia, obesity, iron deficiency anemia, GERD, vitamin D deficiency, who presented to [facility] ED from assisted living facility due to sudden onset shaking chills.  She was in her usual state of health prior to this.  Noted to be febrile with a temperature of 102.  EMS was activated.    Upon presentation to the ED, work-up revealed acute hypoxia requiring 4 L [place] to maintain O2 saturation greater than 92%, hypotension and SIRS.  Code sepsis was called in the ED.  Additionally, the patient had asymmetrical lower extremity edema left greater than right.  Due to concern for possible pulmonary embolism, heparin drip was started by EDP.  TRH, hospitalist service was asked to admit.  ED Course: Tmax 100.  BP 97/32, pulse 79, respiratory rate 20, O2 saturation 97% on 4 L.  Lab studies remarkable for WBC 12.5, hemoglobin 10.9, MCV 89.  Review of Systems: Review of systems as noted in the HPI. All other systems reviewed and are negative.  Serum bicarb 21, glucose 278, BUN 39, creatinine 2.12.  GFR 22.   Past Medical History:  Diagnosis Date   Anemia, unspecified    Arthritis    "knees; right shoulder" (09/15/2013)   Basal cell carcinoma    "right upper outer lip"   DM neuropathy, type II diabetes mellitus ([place])    Dyspnea 2021   with low iron   GERD (gastroesophageal reflux disease)    Gout    High cholesterol    Hypertension    Hypothyroidism    Meniere's disease    Obesity (BMI 30-39.9)    Other forms of  epilepsy and recurrent seizures without mention of intractable epilepsy 11/04/2012   Non convulsive paroxysmal spells, responding to [facility], patient not driving.    Pneumonia ~ 1943   Skin cancer    "forehead; right hand"   Stage 4 chronic kidney disease ([place])    UTI (urinary tract infection)    Vitamin D deficiency    Past Surgical History:  Procedure Laterality Date   BIOPSY  04/05/2020   Procedure: BIOPSY;  Surgeon: Jerene Bears, MD;  Location: [facility];  Service: Gastroenterology;;   CERVICAL POLYPECTOMY     COLONOSCOPY WITH PROPOFOL N/A 04/06/2020   Procedure: COLONOSCOPY WITH PROPOFOL;  Surgeon: Jerene Bears, MD;  Location: [facility];  Service: Gastroenterology;  Laterality: N/A;   ESOPHAGOGASTRODUODENOSCOPY (EGD) WITH PROPOFOL N/A 04/05/2020   Procedure: ESOPHAGOGASTRODUODENOSCOPY (EGD) WITH PROPOFOL;  Surgeon: Jerene Bears, MD;  Location: [facility];  Service: Gastroenterology;  Laterality: N/A;   HAMMER TOE SURGERY Left 1980's   MOHS SURGERY  20087   "right upper outer lip"   POLYPECTOMY  04/06/2020   Procedure: POLYPECTOMY;  Surgeon: Jerene Bears, MD;  Location: [facility];  Service: Gastroenterology;;   TONSILLECTOMY  ~ [place] Left ~ 1950   "ran arm thru window"    Social History:  reports that she quit smoking about 38 years ago. Her smoking use included cigarettes. She has a 30.00 pack-year smoking history. She has never used smokeless tobacco. She reports current alcohol use. She reports  that she does not use drugs.   Allergies  Allergen Reactions   Ppd [Tuberculin Purified Protein Derivative] Other (See Comments)    indurated    Family History  Problem Relation Age of Onset   Heart disease Mother    Stroke Mother    Heart disease Father    Ovarian cancer Sister    Hypertension Son    Hypertension Son    Diabetes Son    Alcohol abuse Son       Prior to Admission medications   Medication Sig Start Date End Date Taking?  Authorizing Provider  acetaminophen (TYLENOL) 500 MG tablet Take 1,000 mg by mouth every 6 (six) hours as needed for moderate pain.   Yes [provider]  allopurinol (ZYLOPRIM) 100 MG tablet Take 1 tablet (100 mg total) by mouth 2 (two) times daily. Take  1 tablet  Daily  to prevent Gout. 02/07/21  Yes Alycia Rossetti, NP  aspirin 81 MG tablet Take 81 mg by mouth daily.   Yes [provider]  atenolol (TENORMIN) 50 MG tablet TAKE ONE TABLET BY MOUTH DAILY GENERIC EQUIVALENT FOR TENORMIN Patient taking differently: Take 50 mg by mouth daily. 06/06/21  Yes [place], Kenney Houseman, NP  cholecalciferol (VITAMIN D3) 25 MCG (1000 UNIT) tablet Take 3,000 Units by mouth daily.   Yes [provider]  fish oil-omega-3 fatty acids 1000 MG capsule Take 1,000 mg by mouth at bedtime.   Yes [provider]  furosemide (LASIX) 40 MG tablet TAKE 1 TABLET BY MOUTH DAILY FOR FLUID RETENTION AND ANKLE SWELLING Patient taking differently: Take 40 mg by mouth daily. 03/21/21  Yes Alycia Rossetti, NP  HUMULIN 70/30 (70-30) 100 UNIT/ML injection INJECT 20 UNITS UNDER THE SKIN TWICE DAILY WITH MEALS Patient taking differently: Inject 20 Units into the skin 2 (two) times daily with a meal. 06/25/21  Yes Alycia Rossetti, NP  lansoprazole (PREVACID) 30 MG capsule TAKE 1 CAPSULE BY MOUTH DAILY TO PREVENT HEARTBURN AND INDIGESTION. Patient taking differently: Take 30 mg by mouth daily. 12/19/20  Yes Alycia Rossetti, NP  levETIRAcetam (KEPPRA) 500 MG tablet Take  1 tablet 2  x /day  with Meals  to Prevent Seizures 07/10/21  Yes Unk Pinto, MD  levothyroxine (SYNTHROID) 100 MCG tablet TAKE 1 TABLET BY MOUTH DAILY ON AN EMPTY STOMACH WITH ONLY WATER FOR 30 MINUTES. NO ANTACIDS, CALCIUM OR MAGNESIUM FOR 4 HOURS. AVOID BIOTIN Patient taking differently: Take 100 mcg by mouth daily before breakfast. 11/29/20  Yes Alycia Rossetti, NP  loratadine (CLARITIN) 10 MG tablet Take 10 mg by mouth daily.    Yes [provider]  meclizine (ANTIVERT) 25 MG tablet Take   1 tablet 2 to 3 x /day  ONLY  if needed for Dizziness /Vertigo 07/10/21  Yes Unk Pinto, MD  rOPINIRole (REQUIP) 1 MG tablet Take  1/2 to 1 tablet  3 x /day  as needed for Restless Legs & Cramps / Patient knows to take by mouth 10/17/20  Yes Unk Pinto, MD  vitamin B-12 (CYANOCOBALAMIN) 100 MCG tablet Take 500 mcg by mouth daily.   Yes [provider]  vitamin C (ASCORBIC ACID) 500 MG tablet Take 500 mg by mouth daily.   Yes [provider]  BD VEO INSULIN SYRINGE U/F 31G X 15/64" 0.3 ML MISC 2 (two) times daily. 06/18/21   [provider]  Blood Glucose Monitoring Suppl (ONETOUCH VERIO) w/Device KIT Check  Blood Sugar   4  x /day before Meals & Bedtime   (( Dx-  e11.22 )) 11/03/20   Unk Pinto, MD  doxycycline (VIBRA-TABS) 100 MG tablet Take 1 tablet (100 mg total) by mouth 2 (two) times daily. Patient not taking: Reported on 10/13/2021 07/02/21   Newt Minion, MD  glucose blood [facility] VERIO) test strip Check Blood Sugar 4 x /day  (( Dx-  e11.22 )) 11/03/20   Unk Pinto, MD  hydrocortisone (ANUSOL-HC) 2.5 % rectal cream Place rectally 2 (two) times daily. Patient not taking: Reported on 10/13/2021 04/06/20   Kayleen Memos, DO  Lancets [facility] ULTRASOFT) lancets Check Blood Sugar  4 x /day  before Meals & Bedtime   (( Dx-  e11.22 )) 11/03/20   Unk Pinto, MD    Physical Exam: BP (!) 103/27   Pulse 82   Temp 98.2 F (36.8 C) (Oral)   Resp 19   Ht _0  (1.575 m)   Wt 83.8 kg   SpO2 97%   BMI 33.79 kg/m   General: 88 y.o. year-old female well developed well nourished in no acute distress.  Alert and oriented x3. Cardiovascular: Regular rate and rhythm with no rubs or gallops.  Systolic murmur noted.  No thyromegaly noted.  Asymmetrical lower extremity edema left greater than right. Respiratory: Mild rales at bases.  Poor inspiratory effort. Abdomen: Soft nontender  nondistended with normal bowel sounds x4 quadrants. Muskuloskeletal: No cyanosis or clubbing noted bilaterally Neuro: CN II-XII intact, strength, sensation, reflexes Skin: No ulcerative lesions noted or rashes Psychiatry: Judgement and insight appear normal. Mood is appropriate for condition and setting          Labs on Admission:  Basic Metabolic Panel: Recent Labs  Lab 10/13/21 2352  NA 139  K 4.3  CL 108  CO2 21*  GLUCOSE 278*  BUN 39*  CREATININE 2.12*  CALCIUM 9.2   Liver Function Tests: Recent Labs  Lab 10/13/21 2352  AST 27  ALT 17  ALKPHOS 65  BILITOT 0.9  PROT 5.9*  ALBUMIN 3.3*   No results for input(s): "LIPASE", "AMYLASE" in the last 168 hours. No results for input(s): "AMMONIA" in the last 168 hours. CBC: Recent Labs  Lab 10/13/21 2352  WBC 12.5*  NEUTROABS 10.9*  HGB 10.9*  HCT 36.6  MCV 89.7  PLT 351   Cardiac Enzymes: No results for input(s): "CKTOTAL", "CKMB", "CKMBINDEX", "TROPONINI" in the last 168 hours.  BNP (last 3 results) Recent Labs    10/13/21 2355  BNP 494.1*    ProBNP (last 3 results) No results for input(s): "PROBNP" in the last 8760 hours.  CBG: Recent Labs  Lab 10/14/21 0357  GLUCAP 241*    Radiological Exams on Admission: DG Chest Port 1 View  Result Date: 10/14/2021 CLINICAL DATA:  Questionable sepsis - evaluate for abnormality EXAM: PORTABLE CHEST 1 VIEW COMPARISON:  04/03/2020 FINDINGS: Cardiomegaly, vascular congestion. No overt edema. No confluent opacities or effusions. No acute bony abnormality. Aortic atherosclerosis. IMPRESSION: Cardiomegaly, vascular congestion Electronically Signed   By: Rolm Baptise M.D.   On: 10/14/2021 00:29    EKG: I independently viewed the EKG done and my findings are as followed: Sinus rhythm rate of 80.  Nonspecific ST-T changes.  QTc 445.  Assessment/Plan Present on Admission:  Acute respiratory failure with hypoxia (HCC)  Principal Problem:   Acute respiratory failure  with hypoxia (HCC)  Acute hypoxic respiratory failure secondary to pulmonary edema versus others Not on oxygen supplementation at baseline, currently requiring  4 L to maintain O2 saturation greater than 92%. Personally reviewed chest x-ray done on admission which shows cardiomegaly and mild pulmonary edema. Has received IV fluid hydration in the ED, will hold off IV fluid for now. Incentive spirometer Mobilize as tolerated Due to concern for pulmonary embolism, started on heparin drip by EDP. Follow D-dimer, if negative DC heparin drip.  Septic shock with unclear source of infection. Meets SIRS criteria, WBC greater than 12 K, respiration rate 24, reported temperature of 102. Hypotensive with MAP in the 50s despite IV fluid bolus, midodrine challenge, and IV fluid hydration. Started vasopressor, Levophed PCCM, Dr. Patsey Berthold, consulted UA is pending Follow blood cultures Monitor fever curve and WBC Continue broad-spectrum IV antibiotics for now, cefepime, IV Flagyl and IV vancomycin- MRSA screening test is pending. De-escalate antibiotics when able.  Acute on chronic diastolic CHF BNP 330, cardiomegaly and pulm edema on chest x-ray Bilateral lower extremity edema Hold off diuretics for now due to hypotension Follow 2D echo Start strict I's and O's and daily weight  Essential hypertension, currently hypotensive Requiring vasopressor to maintain MAP greater than 65 Hold off home oral antihypertensives. Continue to closely monitor vital signs  CKD 4 At baseline Avoid nephrotoxic agents and hypotension Monitor urine output Repeat renal panel in the morning  Type 2 diabetes with hyperglycemia Obtain hemoglobin A1c Start insulin sliding scale  Seizure disorder Resume home AEDs  Hypothyroidism Resume home levothyroxine Last TSH 3.30 on 05/14/2021.  Ambulatory dysfunction Uses a walker to ambulate Fall precautions   Critical care time: 65 minutes.    DVT prophylaxis:  Heparin drip  Code Status: Partial code, DO NOT INTUBATE.  Family Communication: None at bedside.  Disposition Plan: Admitted to stepdown unit  Consults called: PCCM  Admission status: Inpatient status.   Status is: Inpatient The patient requires at least 2 midnights for further evaluation and treatment of present condition.   Kayleen Memos MD [facility] Pager [phone]  If 7PM-7AM, please contact night-coverage [email or web address] Password [facility]  10/14/2021, 4:52 AM

## 2021-10-14 NOTE — Progress Notes (Signed)
PHARMACY - PHYSICIAN COMMUNICATION CRITICAL VALUE ALERT - BLOOD CULTURE IDENTIFICATION (BCID)  [name] [name] is an 88 y.o. female who presented to [facility] on 10/13/2021 with a chief complaint of shaking, chills.   Assessment:  Pt started on broad spectrum antibiotics on admission. -BCID + 1/4 group B strep  Name of physician (or Provider) Contacted: Dr. Hal Hope  Current antibiotics: Vancomycin + cefepime + metronidazole  Changes to prescribed antibiotics recommended: Narrowing to penicillin G. Dose reduced to 12 million units/day for renal function.  Results for orders placed or performed during the hospital encounter of 10/13/21  Blood Culture ID Panel (Reflexed) (Collected: 10/13/2021 11:40 PM)  Result Value Ref Range   Enterococcus faecalis NOT DETECTED NOT DETECTED   Enterococcus Faecium NOT DETECTED NOT DETECTED   Listeria monocytogenes NOT DETECTED NOT DETECTED   Staphylococcus species NOT DETECTED NOT DETECTED   Staphylococcus aureus (BCID) NOT DETECTED NOT DETECTED   Staphylococcus epidermidis NOT DETECTED NOT DETECTED   Staphylococcus lugdunensis NOT DETECTED NOT DETECTED   Streptococcus species DETECTED (A) NOT DETECTED   Streptococcus agalactiae DETECTED (A) NOT DETECTED   Streptococcus pneumoniae NOT DETECTED NOT DETECTED   Streptococcus pyogenes NOT DETECTED NOT DETECTED   A.calcoaceticus-baumannii NOT DETECTED NOT DETECTED   Bacteroides fragilis NOT DETECTED NOT DETECTED   Enterobacterales NOT DETECTED NOT DETECTED   Enterobacter cloacae complex NOT DETECTED NOT DETECTED   Escherichia coli NOT DETECTED NOT DETECTED   Klebsiella aerogenes NOT DETECTED NOT DETECTED   Klebsiella oxytoca NOT DETECTED NOT DETECTED   Klebsiella pneumoniae NOT DETECTED NOT DETECTED   Proteus species NOT DETECTED NOT DETECTED   Salmonella species NOT DETECTED NOT DETECTED   Serratia marcescens NOT DETECTED NOT DETECTED   Haemophilus influenzae NOT DETECTED NOT DETECTED    Neisseria meningitidis NOT DETECTED NOT DETECTED   Pseudomonas aeruginosa NOT DETECTED NOT DETECTED   Stenotrophomonas maltophilia NOT DETECTED NOT DETECTED   Candida albicans NOT DETECTED NOT DETECTED   Candida auris NOT DETECTED NOT DETECTED   Candida glabrata NOT DETECTED NOT DETECTED   Candida krusei NOT DETECTED NOT DETECTED   Candida parapsilosis NOT DETECTED NOT DETECTED   Candida tropicalis NOT DETECTED NOT DETECTED   Cryptococcus neoformans/gattii NOT DETECTED NOT DETECTED    Lenis Noon, PharmD 10/14/2021  9:40 PM

## 2021-10-14 NOTE — Progress Notes (Signed)
Bilateral LE venous duplex study completed. Please see CV Proc for preliminary results.  Abia Monaco BS, RVT 10/14/2021 10:24 AM

## 2021-10-14 NOTE — ED Notes (Addendum)
Assumed pt care with RN oversight. Pt A&Ox4.

## 2021-10-14 NOTE — ED Notes (Signed)
Advised RN of B/P [id number]

## 2021-10-15 DIAGNOSIS — B955 Unspecified streptococcus as the cause of diseases classified elsewhere: Secondary | ICD-10-CM

## 2021-10-15 DIAGNOSIS — J9601 Acute respiratory failure with hypoxia: Secondary | ICD-10-CM | POA: Diagnosis not present

## 2021-10-15 DIAGNOSIS — R6521 Severe sepsis with septic shock: Secondary | ICD-10-CM | POA: Diagnosis not present

## 2021-10-15 DIAGNOSIS — R7881 Bacteremia: Secondary | ICD-10-CM

## 2021-10-15 DIAGNOSIS — A419 Sepsis, unspecified organism: Secondary | ICD-10-CM | POA: Diagnosis not present

## 2021-10-15 LAB — CBC WITH DIFFERENTIAL/PLATELET
Abs Immature Granulocytes: 0.19 10*3/uL — ABNORMAL HIGH (ref 0.00–0.07)
Basophils Absolute: 0.1 10*3/uL (ref 0.0–0.1)
Basophils Relative: 1 %
Eosinophils Absolute: 0.6 10*3/uL — ABNORMAL HIGH (ref 0.0–0.5)
Eosinophils Relative: 6 %
HCT: 34.2 % — ABNORMAL LOW (ref 36.0–46.0)
Hemoglobin: 10 g/dL — ABNORMAL LOW (ref 12.0–15.0)
Immature Granulocytes: 2 %
Lymphocytes Relative: 11 %
Lymphs Abs: 1 10*3/uL (ref 0.7–4.0)
MCH: 27.2 pg (ref 26.0–34.0)
MCHC: 29.2 g/dL — ABNORMAL LOW (ref 30.0–36.0)
MCV: 92.9 fL (ref 80.0–100.0)
Monocytes Absolute: 0.5 10*3/uL (ref 0.1–1.0)
Monocytes Relative: 5 %
Neutro Abs: 7.2 10*3/uL (ref 1.7–7.7)
Neutrophils Relative %: 75 %
Platelets: 305 10*3/uL (ref 150–400)
RBC: 3.68 MIL/uL — ABNORMAL LOW (ref 3.87–5.11)
RDW: 19.9 % — ABNORMAL HIGH (ref 11.5–15.5)
WBC: 9.5 10*3/uL (ref 4.0–10.5)
nRBC: 0 % (ref 0.0–0.2)

## 2021-10-15 LAB — RENAL FUNCTION PANEL
Albumin: 2.7 g/dL — ABNORMAL LOW (ref 3.5–5.0)
Anion gap: 7 (ref 5–15)
BUN: 45 mg/dL — ABNORMAL HIGH (ref 8–23)
CO2: 20 mmol/L — ABNORMAL LOW (ref 22–32)
Calcium: 8.4 mg/dL — ABNORMAL LOW (ref 8.9–10.3)
Chloride: 104 mmol/L (ref 98–111)
Creatinine, Ser: 2.05 mg/dL — ABNORMAL HIGH (ref 0.44–1.00)
GFR, Estimated: 23 mL/min — ABNORMAL LOW (ref >60)
Glucose, Bld: 387 mg/dL — ABNORMAL HIGH (ref 70–99)
Phosphorus: 2.9 mg/dL (ref 2.5–4.6)
Potassium: 5 mmol/L (ref 3.5–5.1)
Sodium: 131 mmol/L — ABNORMAL LOW (ref 135–145)

## 2021-10-15 LAB — COMPREHENSIVE METABOLIC PANEL
ALT: 15 U/L (ref 0–44)
AST: 16 U/L (ref 15–41)
Albumin: 2.7 g/dL — ABNORMAL LOW (ref 3.5–5.0)
Alkaline Phosphatase: 49 U/L (ref 38–126)
Anion gap: 7 (ref 5–15)
BUN: 44 mg/dL — ABNORMAL HIGH (ref 8–23)
CO2: 20 mmol/L — ABNORMAL LOW (ref 22–32)
Calcium: 8.6 mg/dL — ABNORMAL LOW (ref 8.9–10.3)
Chloride: 106 mmol/L (ref 98–111)
Creatinine, Ser: 2.04 mg/dL — ABNORMAL HIGH (ref 0.44–1.00)
GFR, Estimated: 23 mL/min — ABNORMAL LOW (ref >60)
Glucose, Bld: 408 mg/dL — ABNORMAL HIGH (ref 70–99)
Potassium: 5.3 mmol/L — ABNORMAL HIGH (ref 3.5–5.1)
Sodium: 133 mmol/L — ABNORMAL LOW (ref 135–145)
Total Bilirubin: 0.9 mg/dL (ref 0.3–1.2)
Total Protein: 5.2 g/dL — ABNORMAL LOW (ref 6.5–8.1)

## 2021-10-15 LAB — PHOSPHORUS: Phosphorus: 3 mg/dL (ref 2.5–4.6)

## 2021-10-15 LAB — GLUCOSE, CAPILLARY
Glucose-Capillary: 154 mg/dL — ABNORMAL HIGH (ref 70–99)
Glucose-Capillary: 168 mg/dL — ABNORMAL HIGH (ref 70–99)
Glucose-Capillary: 193 mg/dL — ABNORMAL HIGH (ref 70–99)

## 2021-10-15 LAB — URINE CULTURE: Culture: NO GROWTH

## 2021-10-15 LAB — MAGNESIUM: Magnesium: 2 mg/dL (ref 1.7–2.4)

## 2021-10-15 MED ORDER — SENNOSIDES-DOCUSATE SODIUM 8.6-50 MG PO TABS
1.0000 | ORAL_TABLET | Freq: Two times a day (BID) | ORAL | Status: DC
  Administered 2021-10-15 – 2021-10-20 (×8): 1 via ORAL
  Filled 2021-10-15 (×9): qty 1

## 2021-10-15 MED ORDER — ENSURE MAX PROTEIN PO LIQD
11.0000 [oz_av] | Freq: Every day | ORAL | Status: DC
Start: 2021-10-15 — End: 2021-10-20
  Administered 2021-10-16 – 2021-10-20 (×5): 11 [oz_av] via ORAL
  Filled 2021-10-15 (×6): qty 330

## 2021-10-15 MED ORDER — DICLOFENAC SODIUM 1 % EX GEL
2.0000 g | Freq: Four times a day (QID) | CUTANEOUS | Status: DC
  Administered 2021-10-15 – 2021-10-19 (×10): 2 g via TOPICAL
  Filled 2021-10-15: qty 100

## 2021-10-15 MED ORDER — SODIUM ZIRCONIUM CYCLOSILICATE 10 G PO PACK
10.0000 g | PACK | Freq: Once | ORAL | Status: AC
  Administered 2021-10-15: 10 g via ORAL
  Filled 2021-10-15: qty 1

## 2021-10-15 MED ORDER — ROPINIROLE HCL 0.25 MG PO TABS
0.5000 mg | ORAL_TABLET | Freq: Three times a day (TID) | ORAL | Status: DC | PRN
  Administered 2021-10-16 – 2021-10-19 (×6): 0.5 mg via ORAL
  Filled 2021-10-15 (×2): qty 1
  Filled 2021-10-15: qty 2
  Filled 2021-10-15: qty 1
  Filled 2021-10-15 (×2): qty 2
  Filled 2021-10-15 (×2): qty 1

## 2021-10-15 MED ORDER — HYDROCORTISONE (PERIANAL) 2.5 % EX CREA
TOPICAL_CREAM | Freq: Two times a day (BID) | CUTANEOUS | Status: DC
  Filled 2021-10-15: qty 28.35

## 2021-10-15 MED ORDER — ASPIRIN 81 MG PO TBEC
81.0000 mg | DELAYED_RELEASE_TABLET | Freq: Every day | ORAL | Status: DC
  Administered 2021-10-15 – 2021-10-20 (×6): 81 mg via ORAL
  Filled 2021-10-15 (×6): qty 1

## 2021-10-15 MED ORDER — OMEGA-3-ACID ETHYL ESTERS 1 G PO CAPS
1.0000 g | ORAL_CAPSULE | Freq: Every day | ORAL | Status: DC
  Administered 2021-10-15 – 2021-10-19 (×5): 1 g via ORAL
  Filled 2021-10-15 (×6): qty 1

## 2021-10-15 NOTE — Progress Notes (Addendum)
NAME:  [name] [name], MRN:  [id number], DOB:  [birth year], LOS: 1 ADMISSION DATE:  10/13/2021, CONSULTATION DATE:  10/14/2021 REFERRING MD:  Dr Nevada Crane, CHIEF COMPLAINT: Bilateral leg pain, fever  History of Present Illness:  88 yo woman with hx of  Multiple medical problems, sent from her assisted living facility with Fever and hypoxia.  Temp was 100 on admission here.  Initially on evaluation she said she felt very cold.   B le edema  (L>R) unchanged.     In ED was started on Vanc, flagyl, and cefepime.  Also started on heparin infusion for possible PE by ED.   Lr 150per hour started midnight - 5 am.  NS 250 cc bolus given.  I dont see that any other fluids were given.     Cr. 2.12 baseline around 2 BNP 494 Lact 2.5 --> 1.6 Wbc 12.5  UA + leuks + WBC ("packed") Bl cx pending.      Currently no complaints.  No SOB.    Pertinent  Medical History  Basal cell carcinoma DM2  GERD Gout HLD HTN Hypothyroidism Menieres disease Hx of "non convulsive paroxysmal spells, responds to keppra" CKD 4  Aortic stenosis  Significant Hospital Events: Including procedures, antibiotic start and stop dates in addition to other pertinent events   8/28-off pressors  Interim History / Subjective:  No overnight events Weaned off pressors overnight Denies any pain or discomfort Denies any shortness of breath Denies any fever or chills  Objective   Blood pressure (!) 116/27, pulse 74, temperature 98.1 F (36.7 C), temperature source Oral, resp. rate (!) 21, height '5\' 2"'  (1.575 m), weight 84.7 kg, SpO2 97 %.        Intake/Output Summary (Last 24 hours) at 10/15/2021 0828 Last data filed at 10/15/2021 0600 Gross per 24 hour  Intake 723.76 ml  Output 1100 ml  Net -376.24 ml   Filed Weights   10/14/21 0130 10/14/21 0350 10/15/21 0311  Weight: 82.9 kg 83.8 kg 84.7 kg    Examination: General: Elderly, does appear comfortable HENT: Moist oral mucosa Lungs: Clear breath sounds  bilaterally Cardiovascular: S1-S2 appreciated Abdomen: Soft, bowel sounds appreciated Extremities: No clubbing, no edema Neuro: Alert and oriented x3, moving all extremities GU:   Resolved Hospital Problem list     Assessment & Plan:  .  Sepsis -Source unclear -Elevated WBC, respiratory rate, with a temperature 102 at presentation -Required pressors-is weaned off -UA with no organism -Blood cultures -group B strep-1 out of 2 -On penicillin G  .  Hypoxemia -This is likely related to pulmonary congestion/pulmonary edema -There was concern for pulmonary embolism initially but anticoagulation has been stopped -D-dimer 0.29  .  Type 2 diabetes. -SSI  .  GERD  .  Acute on chronic diastolic heart failure -BNP 494 -Echo with grade 2 diastolic dysfunction  .  Hyperlipidemia  .  Hypothyroidism -Continue levothyroxine  .  Chronic kidney disease -Acute kidney injury -Avoid nephrotoxic's -Maintain renal perfusion  .  Aortic stenosis -Moderate aortic valve stenosis on echo  .  History of seizure disorder -Continue home antiepileptic drugs  We will consider transferring to medical floor if she no longer requires pressors  Best Practice (right click and "Reselect all SmartList Selections" daily)   Diet/type: Regular consistency (see orders) DVT prophylaxis: SCD GI prophylaxis: PPI Lines: N/A Foley:  N/A Code Status:  DNR Last date of multidisciplinary goals of care discussion [per primary, DNR]  Labs   CBC: Recent Labs  Lab 10/13/21 2352 10/14/21 0853 10/15/21 0311  WBC 12.5* 16.5* 9.5  NEUTROABS 10.9* 13.9* 7.2  HGB 10.9* 10.7* 10.0*  HCT 36.6 36.0 34.2*  MCV 89.7 90.0 92.9  PLT 351 372 427    Basic Metabolic Panel: Recent Labs  Lab 10/13/21 2352 10/14/21 0853 10/15/21 0311  NA 139 141  141 131*  133*  K 4.3 4.3  4.4 5.0  5.3*  CL 108 112*  112* 104  106  CO2 21* 22  22 20*  20*  GLUCOSE 278* 192*  190* 387*  408*  BUN 39* 42*  41*  45*  44*  CREATININE 2.12* 2.27*  2.17* 2.05*  2.04*  CALCIUM 9.2 9.1  9.1 8.4*  8.6*  MG  --  1.9 2.0  PHOS  --  3.1  3.2 2.9  3.0   GFR: Estimated Creatinine Clearance: 19.9 mL/min (A) (by C-G formula based on SCr of 2.05 mg/dL (H)). Recent Labs  Lab 10/13/21 2352 10/14/21 0144 10/14/21 0853 10/15/21 0311  WBC 12.5*  --  16.5* 9.5  LATICACIDVEN 2.5* 1.6  --   --     Liver Function Tests: Recent Labs  Lab 10/13/21 2352 10/14/21 0853 10/15/21 0311  AST '27 22 16  ' ALT '17 16 15  ' ALKPHOS 65 56 49  BILITOT 0.9 0.9 0.9  PROT 5.9* 5.6* 5.2*  ALBUMIN 3.3* 2.9*  2.9* 2.7*  2.7*   No results for input(s): "LIPASE", "AMYLASE" in the last 168 hours. No results for input(s): "AMMONIA" in the last 168 hours.  ABG    Component Value Date/Time   HCO3 21.3 10/14/2021 0000   TCO2 21 05/26/2008 1908   ACIDBASEDEF 1.8 10/14/2021 0000   O2SAT 82.2 10/14/2021 0000     Coagulation Profile: Recent Labs  Lab 10/13/21 2352  INR 1.3*    Cardiac Enzymes: No results for input(s): "CKTOTAL", "CKMB", "CKMBINDEX", "TROPONINI" in the last 168 hours.  HbA1C: Hgb A1c MFr Bld  Date/Time Value Ref Range Status  10/14/2021 08:53 AM 5.6 4.8 - 5.6 % Final    Comment:    (NOTE) Pre diabetes:          5.7%-6.4%  Diabetes:              >6.4%  Glycemic control for   <7.0% adults with diabetes   05/14/2021 10:29 AM 6.1 (H) <5.7 % of total Hgb Final    Comment:    For someone without known diabetes, a hemoglobin  A1c value between 5.7% and 6.4% is consistent with prediabetes and should be confirmed with a  follow-up test. . For someone with known diabetes, a value <7% indicates that their diabetes is well controlled. A1c targets should be individualized based on duration of diabetes, age, comorbid conditions, and other considerations. . This assay result is consistent with an increased risk of diabetes. . Currently, no consensus exists regarding use of hemoglobin A1c for  diagnosis of diabetes for children. .     CBG: Recent Labs  Lab 10/14/21 1200 10/14/21 1605 10/14/21 2023 10/14/21 2135 10/15/21 0810  GLUCAP 123* 172* 158* 157* 154*    Review of Systems:   Feeling better overall  Past Medical History:  She,  has a past medical history of Anemia, unspecified, Arthritis, Basal cell carcinoma, DM neuropathy, type II diabetes mellitus ([place]), Dyspnea (2021), GERD (gastroesophageal reflux disease), Gout, High cholesterol, Hypertension, Hypothyroidism, Meniere's disease, Obesity (BMI 30-39.9), Other forms of epilepsy and recurrent seizures without mention of intractable epilepsy (11/04/2012),  Pneumonia (~ 1943), Skin cancer, Stage 4 chronic kidney disease ([place]), UTI (urinary tract infection), and Vitamin D deficiency.   Surgical History:   Past Surgical History:  Procedure Laterality Date   BIOPSY  04/05/2020   Procedure: BIOPSY;  Surgeon: Jerene Bears, MD;  Location: [facility];  Service: Gastroenterology;;   CERVICAL POLYPECTOMY     COLONOSCOPY WITH PROPOFOL N/A 04/06/2020   Procedure: COLONOSCOPY WITH PROPOFOL;  Surgeon: Jerene Bears, MD;  Location: [facility];  Service: Gastroenterology;  Laterality: N/A;   ESOPHAGOGASTRODUODENOSCOPY (EGD) WITH PROPOFOL N/A 04/05/2020   Procedure: ESOPHAGOGASTRODUODENOSCOPY (EGD) WITH PROPOFOL;  Surgeon: Jerene Bears, MD;  Location: [facility];  Service: Gastroenterology;  Laterality: N/A;   HAMMER TOE SURGERY Left 1980's   MOHS SURGERY  [address]87   "right upper outer lip"   POLYPECTOMY  04/06/2020   Procedure: POLYPECTOMY;  Surgeon: Jerene Bears, MD;  Location: [facility];  Service: Gastroenterology;;   TONSILLECTOMY  ~ [place] Left ~ 1950   "ran arm thru window"     Social History:   reports that she quit smoking about 38 years ago. Her smoking use included cigarettes. She has a 30.00 pack-year smoking history. She has never used smokeless tobacco. She reports current alcohol use. She  reports that she does not use drugs.   Family History:  Her family history includes Alcohol abuse in her son; Diabetes in her son; Heart disease in her father and mother; Hypertension in her son and son; Ovarian cancer in her sister; Stroke in her mother.   Allergies Allergies  Allergen Reactions   Ppd [Tuberculin Purified Protein Derivative] Other (See Comments)    indurated     Home Medications  Prior to Admission medications   Medication Sig Start Date End Date Taking? Authorizing Provider  acetaminophen (TYLENOL) 500 MG tablet Take 1,000 mg by mouth every 6 (six) hours as needed for moderate pain.   Yes [provider]  allopurinol (ZYLOPRIM) 100 MG tablet Take 1 tablet (100 mg total) by mouth 2 (two) times daily. Take  1 tablet  Daily  to prevent Gout. 02/07/21  Yes Alycia Rossetti, NP  aspirin 81 MG tablet Take 81 mg by mouth daily.   Yes [provider]  atenolol (TENORMIN) 50 MG tablet TAKE ONE TABLET BY MOUTH DAILY GENERIC EQUIVALENT FOR TENORMIN Patient taking differently: Take 50 mg by mouth daily. 06/06/21  Yes Cranford, Kenney Houseman, NP  cholecalciferol (VITAMIN D3) 25 MCG (1000 UNIT) tablet Take 3,000 Units by mouth daily.   Yes [provider]  fish oil-omega-3 fatty acids 1000 MG capsule Take 1,000 mg by mouth at bedtime.   Yes [provider]  furosemide (LASIX) 40 MG tablet TAKE 1 TABLET BY MOUTH DAILY FOR FLUID RETENTION AND ANKLE SWELLING Patient taking differently: Take 40 mg by mouth daily. 03/21/21  Yes Alycia Rossetti, NP  HUMULIN 70/30 (70-30) 100 UNIT/ML injection INJECT 20 UNITS UNDER THE SKIN TWICE DAILY WITH MEALS Patient taking differently: Inject 20 Units into the skin 2 (two) times daily with a meal. 06/25/21  Yes Alycia Rossetti, NP  lansoprazole (PREVACID) 30 MG capsule TAKE 1 CAPSULE BY MOUTH DAILY TO PREVENT HEARTBURN AND INDIGESTION. Patient taking differently: Take 30 mg by mouth daily. 12/19/20  Yes Alycia Rossetti, NP   levETIRAcetam (KEPPRA) 500 MG tablet Take  1 tablet 2  x /day  with Meals  to Prevent Seizures 07/10/21  Yes Unk Pinto, MD  levothyroxine (  SYNTHROID) 100 MCG tablet TAKE 1 TABLET BY MOUTH DAILY ON AN EMPTY STOMACH WITH ONLY WATER FOR 30 MINUTES. NO ANTACIDS, CALCIUM OR MAGNESIUM FOR 4 HOURS. AVOID BIOTIN Patient taking differently: Take 100 mcg by mouth daily before breakfast. 11/29/20  Yes Alycia Rossetti, NP  loratadine (CLARITIN) 10 MG tablet Take 10 mg by mouth daily.   Yes [provider]  meclizine (ANTIVERT) 25 MG tablet Take   1 tablet 2 to 3 x /day  ONLY  if needed for Dizziness /Vertigo 07/10/21  Yes Unk Pinto, MD  rOPINIRole (REQUIP) 1 MG tablet Take  1/2 to 1 tablet  3 x /day  as needed for Restless Legs & Cramps / Patient knows to take by mouth 10/17/20  Yes Unk Pinto, MD  vitamin B-12 (CYANOCOBALAMIN) 100 MCG tablet Take 500 mcg by mouth daily.   Yes [provider]  vitamin C (ASCORBIC ACID) 500 MG tablet Take 500 mg by mouth daily.   Yes [provider]  BD VEO INSULIN SYRINGE U/F 31G X 15/64" 0.3 ML MISC 2 (two) times daily. 06/18/21   [provider]  Blood Glucose Monitoring Suppl (ONETOUCH VERIO) w/Device KIT Check  Blood Sugar   4 x /day before Meals & Bedtime   (( Dx-  e11.22 )) 11/03/20   Unk Pinto, MD  doxycycline (VIBRA-TABS) 100 MG tablet Take 1 tablet (100 mg total) by mouth 2 (two) times daily. Patient not taking: Reported on 10/13/2021 07/02/21   Newt Minion, MD  glucose blood [facility] VERIO) test strip Check Blood Sugar 4 x /day  (( Dx-  e11.22 )) 11/03/20   Unk Pinto, MD  hydrocortisone (ANUSOL-HC) 2.5 % rectal cream Place rectally 2 (two) times daily. Patient not taking: Reported on 10/13/2021 04/06/20   Kayleen Memos, DO  Lancets [facility] ULTRASOFT) lancets Check Blood Sugar  4 x /day  before Meals & Bedtime   (( Dx-  e11.22 )) 11/03/20   Unk Pinto, MD    Sherrilyn Rist, MD [place]  PCCM Pager: See Shea Evans

## 2021-10-15 NOTE — Plan of Care (Signed)
10/15/2021  Problem: Education: Goal: Knowledge of General Education information will improve Description: Including pain rating scale, medication(s)/side effects and non-pharmacologic comfort measures Outcome: Progressing   Problem: Health Behavior/Discharge Planning: Goal: Ability to manage health-related needs will improve Outcome: Progressing   Problem: Clinical Measurements: Goal: Ability to maintain clinical measurements within normal limits will improve Outcome: Progressing Goal: Will remain free from infection Outcome: Progressing Goal: Diagnostic test results will improve Outcome: Progressing Goal: Respiratory complications will improve Outcome: Progressing Goal: Cardiovascular complication will be avoided Outcome: Progressing   Problem: Activity: Goal: Risk for activity intolerance will decrease Outcome: Progressing   Problem: Nutrition: Goal: Adequate nutrition will be maintained Outcome: Progressing   Problem: Coping: Goal: Level of anxiety will decrease Outcome: Progressing   Problem: Elimination: Goal: Will not experience complications related to bowel motility Outcome: Progressing Goal: Will not experience complications related to urinary retention Outcome: Progressing   Problem: Pain Managment: Goal: General experience of comfort will improve Outcome: Progressing   Problem: Safety: Goal: Ability to remain free from injury will improve Outcome: Progressing   Problem: Skin Integrity: Goal: Risk for impaired skin integrity will decrease Outcome: Progressing   Problem: Education: Goal: Ability to describe self-care measures that may prevent or decrease complications (Diabetes Survival Skills Education) will improve Outcome: Progressing   Problem: Coping: Goal: Ability to adjust to condition or change in health will improve Outcome: Progressing   Problem: Fluid Volume: Goal: Ability to maintain a balanced intake and output will improve Outcome:  Progressing   Problem: Health Behavior/Discharge Planning: Goal: Ability to identify and utilize available resources and services will improve Outcome: Progressing Goal: Ability to manage health-related needs will improve Outcome: Progressing   Problem: Metabolic: Goal: Ability to maintain appropriate glucose levels will improve Outcome: Progressing   Problem: Nutritional: Goal: Maintenance of adequate nutrition will improve Outcome: Progressing Goal: Progress toward achieving an optimal weight will improve Outcome: Progressing   Problem: Skin Integrity: Goal: Risk for impaired skin integrity will decrease Outcome: Progressing   Problem: Tissue Perfusion: Goal: Adequacy of tissue perfusion will improve Outcome: Progressing   Problem: Fluid Volume: Goal: Hemodynamic stability will improve Outcome: Progressing   Problem: Clinical Measurements: Goal: Diagnostic test results will improve Outcome: Progressing Goal: Signs and symptoms of infection will decrease Outcome: Progressing   Problem: Respiratory: Goal: Ability to maintain adequate ventilation will improve Outcome: Progressing   Cindy S. Brigitte Pulse BSN, RN, [place] 10/15/2021 1:36 AM

## 2021-10-15 NOTE — Progress Notes (Signed)
PROGRESS NOTE    [name] [name]  PRF:[id number] DOB: 1932/10/09 DOA: 10/13/2021 PCP: Unk Pinto, MD   Brief Narrative:  The patient is an 88 year old elderly obese female with a past medical history significant for but not limited to myeloproliferative neoplasm, essential hypertension, diabetes mellitus type 2, CKD stage IV, hypothyroidism, seizure disorder, hyperlipidemia, iron deficiency anemia, GERD, vitamin D deficiency as well as other comorbidities who presented to [facility] long ED from her assisted living facility due to sudden onset shaking and chills.  She states that she was in her usual state of health prior to this but when her vitals were checked she was noted to have a temperature of 102 and EMS was called.  Upon arrival to the ED she was found to be hypoxic and was placed on 4 L supplemental oxygen via nasal cannula to maintain O2 saturation greater than 92%.  Patient was hypotensive and sepsis code was called and she was noted to have asymmetrical lower extremity edema which was worse on the left compared to the right.  There was initial concern for a PE so heparin drip was started but then was stopped by the critical care team.  Given that she continued to be hypotensive she was placed on pressors and critical care was consulted for further evaluation and management.  Further work-up revealed that she had possible Streptococcus bacteremia so antibiotics were de-escalated to pen G.  She is improving slowly and will need PT and OT to further evaluate and treat.  Assessment and Plan:  Acute hypoxic respiratory failure secondary to pulmonary edema versus others Not on oxygen supplementation at baseline, -was requiring 4 L to maintain O2 saturation greater than 92% on admission -Chest x-ray done on admission which shows cardiomegaly and mild pulmonary edema. -Has received IV fluid hydration in the ED, will hold off IV fluid for now. -Incentive spirometer -SpO2: 99 % O2 Flow Rate  (L/min): 3 L/min -Mobilize as tolerated -Due to concern for pulmonary embolism, started on heparin drip by EDP but now low so stopped  -D-dimer 0.29 and negative so heparin drip has been discontinued -Repeat chest x-ray in the morning   Septic shock with likely Streptococcus bacteremia. -Meets SIRS criteria, WBC greater than 12 K, respiration rate 24, reported temperature of 102. -Hypotensive with MAP in the 50s despite IV fluid bolus, midodrine challenge, and IV fluid hydration. -Started vasopressor, Levophed which is now weaned off -PCCM, Consulted  -UA showed yellow color urine with small leukocytosis, negative nitrites, few bacteria, 0-5 RBCs per high-powered field, and 11-20 WBCs with urine culture pending and blood cultures pending -Follow blood cultures and repeat them today Monitor fever curve and WBC; WBC continues to improve and is now trended down from 16.5 is now 9.5 -Continue broad-spectrum IV antibiotics for now, cefepime, IV Flagyl and IV vancomycin and now has been changed to IV penicillin -De-escalate antibiotics when able.   Acute on Chronic Diastolic CHF -BNP 935, cardiomegaly and pulm edema on chest x-ray -Bilateral lower extremity edema -Hold off diuretics for now due to hypotension -Follow 2D echo and showed EF of 60 to 65% with grade 2 diastolic dysfunction -Start strict I's and O's and daily weight -Repeat chest x-ray in the a.m.   Essential hypertension, currently hypotensive -Requiring vasopressor to maintain MAP greater than 65 but now has been weaned off -Hold off home oral antihypertensives. -Continue to closely monitor vital signs -Appreciate PCCM as a stent   CKD 4 Metabolic Acidosis -At baseline -Patient's BUNs/creatinine went  from 39/2.12 is now 42/2.27 yesterday but is slightly improved to 45/2.05 -Has a slight metabolic acidosis of 20, anion gap of 7, chloride level 106 -Avoid nephrotoxic agents and hypotension -Monitor urine output -Avoid  further nephrotoxic medications, contrast dyes, hypotension and dehydration to ensure adequate renal perfusion   Type 2 diabetes with hyperglycemia -Obtain hemoglobin A1c and was 5.6 -Start insulin sliding scale -Continue monitor blood sugars per protocol   Normocytic anemia -Recommendation hemoglobin/hematocrit went from 10.9/36.6 is now 10.7/36.0 yesterday and today it is 10.0/34.2 -Anemia panel was checked and showed an iron level of 21, U IBC of 223, TIBC 244, saturation ratio of 9%, ferritin level 77, folate of 8.6, vitamin B12 917 -Continue to monitor for signs and symptoms of bleeding; no overt bleeding noted -Repeat CBC in a.m.   Seizure disorder -Resume home AEDs -Continue with seizure precautions  Hyperkalemia -Patient's potassium was 5.3 -Give a dose of Lokelma 10 g once -Continue monitor and trend and repeat CMP in a.m.   Hypothyroidism -Resume home levothyroxine -Last TSH 3.30 on 05/14/2021.   Ambulatory dysfunction -Uses a walker to ambulate -Fall precautions -We will need PT and OT to further evaluate and treat and this has been ordered  Constipation/hemorrhoids -Ordered senna docusate 1 tab p.o. twice daily as well as Anusol rectal cream  Obesity -Complicates overall prognosis and care -Estimated body mass index is 34.15 kg/m as calculated from the following:   Height as of this encounter: '5\' 2"'$  (1.575 m).   Weight as of this encounter: 84.7 kg.  -Weight Loss and Dietary Counseling given  DVT prophylaxis: heparin injection 5,000 Units Start: 10/14/21 0815    Code Status: DNR Family Communication: No family currently at bedside  Disposition Plan:  Level of care: Stepdown Status is: Inpatient Remains inpatient appropriate because: Pending further clinical improvement and evaluation by PT OT    Consultants:  PCCM/pulmonary  Procedures:  As above  Antimicrobials:  Anti-infectives (From admission, onward)    Start     Dose/Rate Route Frequency  Ordered Stop   10/16/21 0000  vancomycin (VANCOREADY) IVPB 750 mg/150 mL  Status:  Discontinued        750 mg 150 mL/hr over 60 Minutes Intravenous Every 48 hours 10/14/21 0204 10/14/21 2135   10/14/21 2300  penicillin G potassium 6 Million Units in dextrose 5 % 500 mL continuous infusion        6 Million Units 41.7 mL/hr over 12 Hours Intravenous Every 12 hours 10/14/21 2136     10/14/21 2200  ceFEPIme (MAXIPIME) 2 g in sodium chloride 0.9 % 100 mL IVPB  Status:  Discontinued        2 g 200 mL/hr over 30 Minutes Intravenous Every 24 hours 10/14/21 0204 10/14/21 2134   10/14/21 1200  metroNIDAZOLE (FLAGYL) IVPB 500 mg  Status:  Discontinued        500 mg 100 mL/hr over 60 Minutes Intravenous Every 12 hours 10/14/21 0204 10/14/21 2135   10/13/21 2345  ceFEPIme (MAXIPIME) 2 g in sodium chloride 0.9 % 100 mL IVPB        2 g 200 mL/hr over 30 Minutes Intravenous  Once 10/13/21 2337 10/14/21 0046   10/13/21 2345  metroNIDAZOLE (FLAGYL) IVPB 500 mg        500 mg 100 mL/hr over 60 Minutes Intravenous  Once 10/13/21 2337 10/14/21 0255   10/13/21 2345  vancomycin (VANCOCIN) IVPB 1000 mg/200 mL premix        1,000 mg 200 mL/hr  over 60 Minutes Intravenous  Once 10/13/21 2337 10/14/21 0255       Subjective: Seen and examined at bedside and she was doing better.  Denies chest pain or shortness breath.  No nausea or vomiting.  Denies any lightheadedness or dizziness.  No other concerns at this time.  Objective: Vitals:   10/15/21 1730 10/15/21 1745 10/15/21 1800 10/15/21 1950  BP: (!) 119/39  (!) 129/37   Pulse: 78 76 78   Resp: 19 (!) 24 (!) 23   Temp:    98.5 F (36.9 C)  TempSrc:    Oral  SpO2: 98% 100% 99%   Weight:      Height:        Intake/Output Summary (Last 24 hours) at 10/15/2021 2017 Last data filed at 10/15/2021 1330 Gross per 24 hour  Intake 799.71 ml  Output 650 ml  Net 149.71 ml   Filed Weights   10/14/21 0130 10/14/21 0350 10/15/21 0311  Weight: 82.9 kg 83.8 kg  84.7 kg   Examination: Physical Exam:  Constitutional: WN/WD obese elderly chronically ill-appearing Caucasian female currently no acute distress Respiratory: Diminished to auscultation bilaterally with coarse breath sounds, no wheezing, rales but did have slight rhonchi.. Normal respiratory effort and patient is not tachypenic. No accessory muscle use.  Wearing supplemental oxygen via nasal cannula Cardiovascular: RRR, no murmurs / rubs / gallops. S1 and S2 auscultated.  Mild lower extremity edema Abdomen: Soft, non-tender, distended secondary body habitus. Bowel sounds positive.  GU: Deferred. Musculoskeletal: No clubbing / cyanosis of digits/nails. No joint deformity upper and lower extremities.  Skin: Has a wound on the plantar surface of her left foot Neurologic: CN 2-12 grossly intact with no focal deficits. Romberg sign and cerebellar reflexes not assessed.  Psychiatric: Normal judgment and insight. Alert and oriented x 3. Normal mood and appropriate affect.   Data Reviewed: I have personally reviewed following labs and imaging studies  CBC: Recent Labs  Lab 10/13/21 2352 10/14/21 0853 10/15/21 0311  WBC 12.5* 16.5* 9.5  NEUTROABS 10.9* 13.9* 7.2  HGB 10.9* 10.7* 10.0*  HCT 36.6 36.0 34.2*  MCV 89.7 90.0 92.9  PLT 351 372 740   Basic Metabolic Panel: Recent Labs  Lab 10/13/21 2352 10/14/21 0853 10/15/21 0311  NA 139 141  141 131*  133*  K 4.3 4.3  4.4 5.0  5.3*  CL 108 112*  112* 104  106  CO2 21* 22  22 20*  20*  GLUCOSE 278* 192*  190* 387*  408*  BUN 39* 42*  41* 45*  44*  CREATININE 2.12* 2.27*  2.17* 2.05*  2.04*  CALCIUM 9.2 9.1  9.1 8.4*  8.6*  MG  --  1.9 2.0  PHOS  --  3.1  3.2 2.9  3.0   GFR: Estimated Creatinine Clearance: 19.9 mL/min (A) (by C-G formula based on SCr of 2.05 mg/dL (H)). Liver Function Tests: Recent Labs  Lab 10/13/21 2352 10/14/21 0853 10/15/21 0311  AST '27 22 16  '$ ALT '17 16 15  '$ ALKPHOS 65 56 49  BILITOT 0.9  0.9 0.9  PROT 5.9* 5.6* 5.2*  ALBUMIN 3.3* 2.9*  2.9* 2.7*  2.7*   No results for input(s): "LIPASE", "AMYLASE" in the last 168 hours. No results for input(s): "AMMONIA" in the last 168 hours. Coagulation Profile: Recent Labs  Lab 10/13/21 2352  INR 1.3*   Cardiac Enzymes: No results for input(s): "CKTOTAL", "CKMB", "CKMBINDEX", "TROPONINI" in the last 168 hours. BNP (last 3 results) No  results for input(s): "PROBNP" in the last 8760 hours. HbA1C: Recent Labs    10/14/21 0853  HGBA1C 5.6   CBG: Recent Labs  Lab 10/14/21 1605 10/14/21 2023 10/14/21 2135 10/15/21 0810 10/15/21 1845  GLUCAP 172* 158* 157* 154* 168*   Lipid Profile: No results for input(s): "CHOL", "HDL", "LDLCALC", "TRIG", "CHOLHDL", "LDLDIRECT" in the last 72 hours. Thyroid Function Tests: No results for input(s): "TSH", "T4TOTAL", "FREET4", "T3FREE", "THYROIDAB" in the last 72 hours. Anemia Panel: Recent Labs    10/14/21 0853  VITAMINB12 917*  FOLATE 8.6  FERRITIN 77  TIBC 244*  IRON 21*  RETICCTPCT 2.4   Sepsis Labs: Recent Labs  Lab 10/13/21 2352 10/14/21 0144  LATICACIDVEN 2.5* 1.6    Recent Results (from the past 240 hour(s))  Blood Culture (routine x 2)     Status: Abnormal (Preliminary result)   Collection Time: 10/13/21 11:40 PM   Specimen: BLOOD  Result Value Ref Range Status   Specimen Description   Final    BLOOD BLOOD RIGHT WRIST Performed at [facility] [address] [place], [place] [address]    Special Requests   Final    BOTTLES DRAWN AEROBIC AND ANAEROBIC Blood Culture adequate volume Performed at [facility] [address] [place], [place] [address]    Culture  Setup Time   Final    GRAM POSITIVE COCCI ANAEROBIC BOTTLE ONLY CRITICAL RESULT CALLED TO, READ BACK BY AND VERIFIED WITH: C/ PHARMD MARY S. 10/14/21 2058 A. LAFRANCE    Culture (A)  Final    GROUP B STREP(S.AGALACTIAE)ISOLATED SUSCEPTIBILITIES TO  FOLLOW Performed at [facility], [place] [address] [place], [place] [address]    Report Status PENDING  Incomplete  Blood Culture ID Panel (Reflexed)     Status: Abnormal   Collection Time: 10/13/21 11:40 PM  Result Value Ref Range Status   Enterococcus faecalis NOT DETECTED NOT DETECTED Final   Enterococcus Faecium NOT DETECTED NOT DETECTED Final   Listeria monocytogenes NOT DETECTED NOT DETECTED Final   Staphylococcus species NOT DETECTED NOT DETECTED Final   Staphylococcus aureus (BCID) NOT DETECTED NOT DETECTED Final   Staphylococcus epidermidis NOT DETECTED NOT DETECTED Final   Staphylococcus lugdunensis NOT DETECTED NOT DETECTED Final   Streptococcus species DETECTED (A) NOT DETECTED Final    Comment: CRITICAL RESULT CALLED TO, READ BACK BY AND VERIFIED WITH: C/ PHARMD MARY S. 10/14/21 2058 A. LAFRANCE    Streptococcus agalactiae DETECTED (A) NOT DETECTED Final    Comment: CRITICAL RESULT CALLED TO, READ BACK BY AND VERIFIED WITH: C/ PHARMD MARY S. 10/14/21 2058 A. LAFRANCE    Streptococcus pneumoniae NOT DETECTED NOT DETECTED Final   Streptococcus pyogenes NOT DETECTED NOT DETECTED Final   A.calcoaceticus-baumannii NOT DETECTED NOT DETECTED Final   Bacteroides fragilis NOT DETECTED NOT DETECTED Final   Enterobacterales NOT DETECTED NOT DETECTED Final   Enterobacter cloacae complex NOT DETECTED NOT DETECTED Final   Escherichia coli NOT DETECTED NOT DETECTED Final   Klebsiella aerogenes NOT DETECTED NOT DETECTED Final   Klebsiella oxytoca NOT DETECTED NOT DETECTED Final   Klebsiella pneumoniae NOT DETECTED NOT DETECTED Final   Proteus species NOT DETECTED NOT DETECTED Final   Salmonella species NOT DETECTED NOT DETECTED Final   Serratia marcescens NOT DETECTED NOT DETECTED Final   Haemophilus influenzae NOT DETECTED NOT DETECTED Final   Neisseria meningitidis NOT DETECTED NOT DETECTED Final   Pseudomonas aeruginosa NOT DETECTED NOT DETECTED Final   Stenotrophomonas  maltophilia  NOT DETECTED NOT DETECTED Final   Candida albicans NOT DETECTED NOT DETECTED Final   Candida auris NOT DETECTED NOT DETECTED Final   Candida glabrata NOT DETECTED NOT DETECTED Final   Candida krusei NOT DETECTED NOT DETECTED Final   Candida parapsilosis NOT DETECTED NOT DETECTED Final   Candida tropicalis NOT DETECTED NOT DETECTED Final   Cryptococcus neoformans/gattii NOT DETECTED NOT DETECTED Final    Comment: Performed at [facility], [place] [address] [place], [place] [address]  Blood Culture (routine x 2)     Status: None (Preliminary result)   Collection Time: 10/14/21 12:40 AM   Specimen: BLOOD  Result Value Ref Range Status   Specimen Description   Final    BLOOD BLOOD LEFT HAND Performed at [facility] [address], [place], [place] [address]    Special Requests   Final    BOTTLES DRAWN AEROBIC AND ANAEROBIC Blood Culture adequate volume Performed at [facility] [address], [place], [place] [address]    Culture   Final    NO GROWTH 1 DAY Performed at [facility], [place] [address] [place], [place] [address]    Report Status PENDING  Incomplete  Resp Panel by RT-PCR (Flu A&B, Covid) Anterior Nasal Swab     Status: None   Collection Time: 10/14/21 12:50 AM   Specimen: Anterior Nasal Swab  Result Value Ref Range Status   SARS Coronavirus 2 by RT PCR NEGATIVE NEGATIVE Final    Comment: (NOTE) SARS-CoV-2 target nucleic acids are NOT DETECTED.  The SARS-CoV-2 RNA is generally detectable in upper respiratory specimens during the acute phase of infection. The lowest concentration of SARS-CoV-2 viral copies this assay can detect is 138 copies/mL. A negative result does not preclude SARS-Cov-2 infection and should not be used as the sole basis for treatment or other patient management decisions. A negative result may occur with  improper specimen collection/handling, submission of specimen other than  nasopharyngeal swab, presence of viral mutation(s) within the areas targeted by this assay, and inadequate number of viral copies(<138 copies/mL). A negative result must be combined with clinical observations, patient history, and epidemiological information. The expected result is Negative.  Fact Sheet for Patients:  EntrepreneurPulse.com.au  Fact Sheet for Healthcare Providers:  IncredibleEmployment.be  This test is no t yet approved or cleared by the [place] FDA and  has been authorized for detection and/or diagnosis of SARS-CoV-2 by FDA under an Emergency Use Authorization (EUA). This EUA will remain  in effect (meaning this test can be used) for the duration of the COVID-19 declaration under Section 564(b)(1) of the Act, 21 U.S.C.section 360bbb-3(b)(1), unless the authorization is terminated  or revoked sooner.       Influenza A by PCR NEGATIVE NEGATIVE Final   Influenza B by PCR NEGATIVE NEGATIVE Final    Comment: (NOTE) The Xpert Xpress SARS-CoV-2/FLU/RSV plus assay is intended as an aid in the diagnosis of influenza from Nasopharyngeal swab specimens and should not be used as a sole basis for treatment. Nasal washings and aspirates are unacceptable for Xpert Xpress SARS-CoV-2/FLU/RSV testing.  Fact Sheet for Patients: EntrepreneurPulse.com.au  Fact Sheet for Healthcare Providers: IncredibleEmployment.be  This test is not yet approved or cleared by the [place] FDA and has been authorized for detection and/or diagnosis of SARS-CoV-2 by FDA under an Emergency Use Authorization (EUA). This EUA will remain in effect (meaning this test can be used) for the duration of the COVID-19 declaration  under Section 564(b)(1) of the Act, 21 U.S.C. section 360bbb-3(b)(1), unless the authorization is terminated or revoked.  Performed at [facility], [place] [address], [place], [place] [address]   MRSA Next Gen by PCR, Nasal     Status: Abnormal   Collection Time: 10/14/21  3:42 AM   Specimen: Nasal Mucosa; Nasal Swab  Result Value Ref Range Status   MRSA by PCR Next Gen DETECTED (A) NOT DETECTED Final    Comment: (NOTE) The GeneXpert MRSA Assay (FDA approved for NASAL specimens only), is one component of a comprehensive MRSA colonization surveillance program. It is not intended to diagnose MRSA infection nor to guide or monitor treatment for MRSA infections. Test performance is not FDA approved in patients less than 58 years old. Performed at [facility], [place] [address] [place], [place] [address]   Urine Culture     Status: None   Collection Time: 10/14/21  9:38 AM   Specimen: In/Out Cath Urine  Result Value Ref Range Status   Specimen Description   Final    IN/OUT CATH URINE Performed at [facility] [address], [place], [place] [address]    Special Requests   Final    NONE Performed at [facility], [place] [address] [place], [place] [address]    Culture   Final    NO GROWTH Performed at [facility], [place] [address], [place], [place] [address]    Report Status 10/15/2021 FINAL  Final     Radiology Studies: VAS [place] LOWER EXTREMITY VENOUS (DVT)  Result Date: 10/14/2021  Lower Venous DVT Study Patient Name:  CAROL THEYS  Date of Exam:   10/14/2021 Medical Rec #: [id number]         Accession #:    [id number] Date of Birth: [birth year]        Patient Gender: F Patient Age:   39 years Exam Location:  [facility] Procedure:      VAS [place] LOWER EXTREMITY VENOUS (DVT) Referring Phys: Collier Bullock --------------------------------------------------------------------------------  Indications: Edema.  Comparison Study: No previous exam noted. Performing Technologist: Bobetta Lime BS, RVT  Examination Guidelines: A complete evaluation includes B-mode imaging, spectral  Doppler, color Doppler, and power Doppler as needed of all accessible portions of each vessel. Bilateral testing is considered an integral part of a complete examination. Limited examinations for reoccurring indications may be performed as noted. The reflux portion of the exam is performed with the patient in reverse Trendelenburg.  +---------+---------------+---------+-----------+----------+--------------+ RIGHT    CompressibilityPhasicitySpontaneityPropertiesThrombus Aging +---------+---------------+---------+-----------+----------+--------------+ CFV      Full           Yes      Yes                                 +---------+---------------+---------+-----------+----------+--------------+ SFJ      Full                                                        +---------+---------------+---------+-----------+----------+--------------+ FV Prox  Full                                                        +---------+---------------+---------+-----------+----------+--------------+  FV Mid   Full                                                        +---------+---------------+---------+-----------+----------+--------------+ FV DistalFull                                                        +---------+---------------+---------+-----------+----------+--------------+ PFV      Full                                                        +---------+---------------+---------+-----------+----------+--------------+ POP      Full           Yes      Yes                                 +---------+---------------+---------+-----------+----------+--------------+ PTV      Full                                                        +---------+---------------+---------+-----------+----------+--------------+ PERO     Full                                                        +---------+---------------+---------+-----------+----------+--------------+    +---------+---------------+---------+-----------+----------+--------------+ LEFT     CompressibilityPhasicitySpontaneityPropertiesThrombus Aging +---------+---------------+---------+-----------+----------+--------------+ CFV      Full           Yes      Yes                                 +---------+---------------+---------+-----------+----------+--------------+ SFJ      Full                                                        +---------+---------------+---------+-----------+----------+--------------+ FV Prox  Full                                                        +---------+---------------+---------+-----------+----------+--------------+ FV Mid   Full                                                        +---------+---------------+---------+-----------+----------+--------------+   FV DistalFull                                                        +---------+---------------+---------+-----------+----------+--------------+ PFV      Full                                                        +---------+---------------+---------+-----------+----------+--------------+ POP      Full           Yes      Yes                                 +---------+---------------+---------+-----------+----------+--------------+ PTV      Full                                                        +---------+---------------+---------+-----------+----------+--------------+ PERO     Full                                                        +---------+---------------+---------+-----------+----------+--------------+     Summary: BILATERAL: - No evidence of deep vein thrombosis seen in the lower extremities, bilaterally. -No evidence of popliteal cyst, bilaterally.   *See table(s) above for measurements and observations. Electronically signed by Harold Barban MD on 10/14/2021 at 8:47:23 PM.    Final    ECHOCARDIOGRAM COMPLETE  Result Date: 10/14/2021     ECHOCARDIOGRAM REPORT   Patient Name:   ANNIYA WHITERS Date of Exam: 10/14/2021 Medical Rec #:  [id number]        Height:       62.0 in Accession #:    [id number]       Weight:       184.7 lb Date of Birth:  Apr 20, 1932       BSA:          1.848 m Patient Age:    89 years         BP:           125/31 mmHg Patient Gender: F                HR:           77 bpm. Exam Location:  Inpatient Procedure: 2D Echo, Cardiac Doppler and Color Doppler Indications:    CHF  History:        Patient has prior history of Echocardiogram examinations, most                 recent 09/18/2013. Risk Factors:Dyslipidemia and Hypertension.  Sonographer:    Jefferey Pica Referring Phys: [id number] [place]  1. Left ventricular ejection fraction, by estimation, is 60 to 65%. The left ventricle has normal function. The left ventricle has no regional wall motion  abnormalities. There is moderate concentric left ventricular hypertrophy. Left ventricular diastolic parameters are consistent with Grade II diastolic dysfunction (pseudonormalization). Elevated left ventricular end-diastolic pressure. There is the interventricular septum is flattened in systole, consistent with right ventricular pressure overload.  2. Right ventricular systolic function is normal. The right ventricular size is normal. There is severely elevated pulmonary artery systolic pressure.  3. Right atrial size was mildly dilated.  4. The mitral valve is grossly normal. Trivial mitral valve regurgitation. No evidence of mitral stenosis.  5. DI 0.31, suggests moderate stenosis. The aortic valve is calcified. There is moderate calcification of the aortic valve. There is moderate thickening of the aortic valve. Aortic valve regurgitation is not visualized. Moderate aortic valve stenosis.  6. The inferior vena cava is dilated in size with <50% respiratory variability, suggesting right atrial pressure of 15 mmHg. Comparison(s): Changes from prior study are noted. Grade 2  diastolic dysfunction with elevated LVEDP and elevated RVSP, moderate aortic stenosis. FINDINGS  Left Ventricle: Left ventricular ejection fraction, by estimation, is 60 to 65%. The left ventricle has normal function. The left ventricle has no regional wall motion abnormalities. The left ventricular internal cavity size was normal in size. There is  moderate concentric left ventricular hypertrophy. The interventricular septum is flattened in systole, consistent with right ventricular pressure overload. Left ventricular diastolic parameters are consistent with Grade II diastolic dysfunction (pseudonormalization). Elevated left ventricular end-diastolic pressure. Right Ventricle: The right ventricular size is normal. Right vetricular wall thickness was not well visualized. Right ventricular systolic function is normal. There is severely elevated pulmonary artery systolic pressure. The tricuspid regurgitant velocity is 3.68 m/s, and with an assumed right atrial pressure of 15 mmHg, the estimated right ventricular systolic pressure is 56.3 mmHg. Left Atrium: Left atrial size was normal in size. Right Atrium: Right atrial size was mildly dilated. Pericardium: There is no evidence of pericardial effusion. Mitral Valve: The mitral valve is grossly normal. Trivial mitral valve regurgitation. No evidence of mitral valve stenosis. Tricuspid Valve: The tricuspid valve is normal in structure. Tricuspid valve regurgitation is mild . No evidence of tricuspid stenosis. Aortic Valve: DI 0.31, suggests moderate stenosis. The aortic valve is calcified. There is moderate calcification of the aortic valve. There is moderate thickening of the aortic valve. Aortic valve regurgitation is not visualized. Moderate aortic stenosis is present. Aortic valve mean gradient measures 35.8 mmHg. Aortic valve peak gradient measures 67.2 mmHg. Aortic valve area, by VTI measures 0.88 cm. Pulmonic Valve: The pulmonic valve was grossly normal. Pulmonic  valve regurgitation is trivial. No evidence of pulmonic stenosis. Aorta: The aortic root, ascending aorta, aortic arch and descending aorta are all structurally normal, with no evidence of dilitation or obstruction. Venous: The inferior vena cava is dilated in size with less than 50% respiratory variability, suggesting right atrial pressure of 15 mmHg. IAS/Shunts: The atrial septum is grossly normal.  LEFT VENTRICLE PLAX 2D LVIDd:         3.90 cm   Diastology LVIDs:         2.60 cm   LV e' medial:    4.16 cm/s LV PW:         1.30 cm   LV E/e' medial:  38.0 LV IVS:        1.40 cm   LV e' lateral:   4.53 cm/s LVOT diam:     1.90 cm   LV E/e' lateral: 34.9 LV SV:         83 LV SV  Index:   45 LVOT Area:     2.84 cm  RIGHT VENTRICLE          IVC RV Basal diam:  2.50 cm  IVC diam: 2.50 cm LEFT ATRIUM             Index        RIGHT ATRIUM           Index LA diam:        4.30 cm 2.33 cm/m   RA Area:     18.10 cm LA Vol (A2C):   44.3 ml 23.97 ml/m  RA Volume:   53.70 ml  29.05 ml/m LA Vol (A4C):   51.5 ml 27.86 ml/m LA Biplane Vol: 49.3 ml 26.67 ml/m  AORTIC VALVE                     PULMONIC VALVE AV Area (Vmax):    0.89 cm      PV Vmax:       0.98 m/s AV Area (Vmean):   0.86 cm      PV Peak grad:  3.9 mmHg AV Area (VTI):     0.88 cm AV Vmax:           409.80 cm/s AV Vmean:          276.000 cm/s AV VTI:            0.943 m AV Peak Grad:      67.2 mmHg AV Mean Grad:      35.8 mmHg LVOT Vmax:         129.00 cm/s LVOT Vmean:        83.400 cm/s LVOT VTI:          0.294 m LVOT/AV VTI ratio: 0.31  AORTA Ao Root diam: 3.20 cm Ao Asc diam:  2.50 cm MITRAL VALVE                TRICUSPID VALVE MV Area (PHT): 4.17 cm     TR Peak grad:   54.2 mmHg MV Decel Time: 182 msec     TR Vmax:        368.00 cm/s MR Peak grad: 59.3 mmHg MR Vmax:      385.00 cm/s   SHUNTS MV E velocity: 158.00 cm/s  Systemic VTI:  0.29 m MV A velocity: 110.00 cm/s  Systemic Diam: 1.90 cm MV E/A ratio:  1.44 Buford Dresser MD Electronically signed  by Buford Dresser MD Signature Date/Time: 10/14/2021/4:05:40 PM    Final    DG Foot Complete Left  Result Date: 10/14/2021 CLINICAL DATA:  88 year old female with fever, foot wound. EXAM: LEFT FOOT - COMPLETE 3+ VIEW COMPARISON:  Left foot series 10/05/2019. 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 Bone mineralization and mild periarticular spurring and irregularity there appears stable since 2021. Other degenerative MTP subluxations. Chronic bulky dorsal midfoot degenerative spurring. No acute fracture, dislocation, or osteolysis identified. No soft tissue gas or discrete ulceration identified. IMPRESSION: Stable degenerative changes since 2021. No acute osseous abnormality identified. Electronically Signed   By: Genevie Ann M.D.   On: 10/14/2021 09:25   DG Chest Port 1 View  Result Date: 10/14/2021 CLINICAL DATA:  Questionable sepsis - evaluate for abnormality EXAM: PORTABLE CHEST 1 VIEW COMPARISON:  04/03/2020 FINDINGS: Cardiomegaly, vascular congestion. No overt edema. No confluent opacities or effusions. No acute bony abnormality. Aortic atherosclerosis. IMPRESSION: Cardiomegaly, vascular congestion Electronically Signed   By:  Rolm Baptise M.D.   On: 10/14/2021 00:29     Scheduled Meds:  allopurinol  100 mg Oral BID   ascorbic acid  500 mg Oral Daily   aspirin EC  81 mg Oral Daily   Chlorhexidine Gluconate Cloth  6 each Topical Q0600   vitamin B-12  500 mcg Oral Daily   diclofenac Sodium  2 g Topical QID   fish oil-omega-3 fatty acids  1,000 mg Oral QHS   heparin  5,000 Units Subcutaneous Q8H   hydrocortisone   Rectal BID   insulin aspart  0-5 Units Subcutaneous QHS   insulin aspart  0-9 Units Subcutaneous TID WC   levETIRAcetam  500 mg Oral BID   levothyroxine  100 mcg Oral QAC breakfast   loratadine  10 mg Oral Daily   mupirocin ointment  1 Application Nasal BID   pantoprazole  20 mg Oral Daily   Ensure Max Protein  11  oz Oral Daily   senna-docusate  1 tablet Oral BID   Continuous Infusions:  sodium chloride 10 mL/hr at 10/15/21 0844   norepinephrine (LEVOPHED) Adult infusion Stopped (10/15/21 0803)   penicillin G potassium 6 Million Units in dextrose 5 % 500 mL continuous infusion Stopped (10/15/21 1206)    LOS: 1 day   Raiford Noble, DO [facility] Available via Epic secure chat 7am-7pm After these hours, please refer to coverage provider listed on [email or web address] 10/15/2021, 8:17 PM

## 2021-10-16 ENCOUNTER — Inpatient Hospital Stay (HOSPITAL_COMMUNITY): Payer: Medicare Other

## 2021-10-16 DIAGNOSIS — R6521 Severe sepsis with septic shock: Secondary | ICD-10-CM | POA: Diagnosis not present

## 2021-10-16 DIAGNOSIS — R7881 Bacteremia: Secondary | ICD-10-CM | POA: Diagnosis not present

## 2021-10-16 DIAGNOSIS — J9601 Acute respiratory failure with hypoxia: Secondary | ICD-10-CM | POA: Diagnosis not present

## 2021-10-16 DIAGNOSIS — A419 Sepsis, unspecified organism: Secondary | ICD-10-CM | POA: Diagnosis not present

## 2021-10-16 LAB — CBC WITH DIFFERENTIAL/PLATELET
Abs Immature Granulocytes: 0.14 10*3/uL — ABNORMAL HIGH (ref 0.00–0.07)
Basophils Absolute: 0 10*3/uL (ref 0.0–0.1)
Basophils Relative: 0 %
Eosinophils Absolute: 0.4 10*3/uL (ref 0.0–0.5)
Eosinophils Relative: 8 %
HCT: 31.8 % — ABNORMAL LOW (ref 36.0–46.0)
Hemoglobin: 9.4 g/dL — ABNORMAL LOW (ref 12.0–15.0)
Immature Granulocytes: 3 %
Lymphocytes Relative: 9 %
Lymphs Abs: 0.5 10*3/uL — ABNORMAL LOW (ref 0.7–4.0)
MCH: 27.2 pg (ref 26.0–34.0)
MCHC: 29.6 g/dL — ABNORMAL LOW (ref 30.0–36.0)
MCV: 91.9 fL (ref 80.0–100.0)
Monocytes Absolute: 0.3 10*3/uL (ref 0.1–1.0)
Monocytes Relative: 6 %
Neutro Abs: 4.2 10*3/uL (ref 1.7–7.7)
Neutrophils Relative %: 74 %
Platelets: 254 10*3/uL (ref 150–400)
RBC: 3.46 MIL/uL — ABNORMAL LOW (ref 3.87–5.11)
RDW: 19.6 % — ABNORMAL HIGH (ref 11.5–15.5)
WBC: 5.6 10*3/uL (ref 4.0–10.5)
nRBC: 0 % (ref 0.0–0.2)

## 2021-10-16 LAB — COMPREHENSIVE METABOLIC PANEL
ALT: 13 U/L (ref 0–44)
AST: 13 U/L — ABNORMAL LOW (ref 15–41)
Albumin: 2.6 g/dL — ABNORMAL LOW (ref 3.5–5.0)
Alkaline Phosphatase: 49 U/L (ref 38–126)
Anion gap: 7 (ref 5–15)
BUN: 38 mg/dL — ABNORMAL HIGH (ref 8–23)
CO2: 20 mmol/L — ABNORMAL LOW (ref 22–32)
Calcium: 9.4 mg/dL (ref 8.9–10.3)
Chloride: 110 mmol/L (ref 98–111)
Creatinine, Ser: 1.91 mg/dL — ABNORMAL HIGH (ref 0.44–1.00)
GFR, Estimated: 25 mL/min — ABNORMAL LOW (ref >60)
Glucose, Bld: 174 mg/dL — ABNORMAL HIGH (ref 70–99)
Potassium: 4.1 mmol/L (ref 3.5–5.1)
Sodium: 137 mmol/L (ref 135–145)
Total Bilirubin: 0.8 mg/dL (ref 0.3–1.2)
Total Protein: 5.2 g/dL — ABNORMAL LOW (ref 6.5–8.1)

## 2021-10-16 LAB — GLUCOSE, CAPILLARY
Glucose-Capillary: 136 mg/dL — ABNORMAL HIGH (ref 70–99)
Glucose-Capillary: 162 mg/dL — ABNORMAL HIGH (ref 70–99)
Glucose-Capillary: 188 mg/dL — ABNORMAL HIGH (ref 70–99)
Glucose-Capillary: 183 mg/dL — ABNORMAL HIGH (ref 70–99)
Glucose-Capillary: 169 mg/dL — ABNORMAL HIGH (ref 70–99)

## 2021-10-16 LAB — MAGNESIUM: Magnesium: 2 mg/dL (ref 1.7–2.4)

## 2021-10-16 LAB — PHOSPHORUS: Phosphorus: 2.6 mg/dL (ref 2.5–4.6)

## 2021-10-16 MED ORDER — FUROSEMIDE 10 MG/ML IJ SOLN
40.0000 mg | Freq: Once | INTRAMUSCULAR | Status: AC
  Administered 2021-10-16: 40 mg via INTRAVENOUS
  Filled 2021-10-16: qty 4

## 2021-10-16 NOTE — Progress Notes (Signed)
PROGRESS NOTE    [name] [name]  JKK:[id number] DOB: 11-10-1932 DOA: 10/13/2021 PCP: Unk Pinto, MD   Brief Narrative:  The patient is an 88 year old elderly obese female with a past medical history significant for but not limited to myeloproliferative neoplasm, essential hypertension, diabetes mellitus type 2, CKD stage IV, hypothyroidism, seizure disorder, hyperlipidemia, iron deficiency anemia, GERD, vitamin D deficiency as well as other comorbidities who presented to [facility] long ED from her assisted living facility due to sudden onset shaking and chills.  She states that she was in her usual state of health prior to this but when her vitals were checked she was noted to have a temperature of 102 and EMS was called.  Upon arrival to the ED she was found to be hypoxic and was placed on 4 L supplemental oxygen via nasal cannula to maintain O2 saturation greater than 92%.  Patient was hypotensive and sepsis code was called and she was noted to have asymmetrical lower extremity edema which was worse on the left compared to the right.  There was initial concern for a PE so heparin drip was started but then was stopped by the critical care team.  Given that she continued to be hypotensive she was placed on pressors and critical care was consulted for further evaluation and management.  Further work-up revealed that she had possible Streptococcus bacteremia so antibiotics were de-escalated to pen G.  She is improving slowly and will need PT and OT to further evaluate and treat and they are recommending SNF  Assessment and Plan:  Acute hypoxic respiratory failure secondary to pulmonary edema versus others Not on oxygen supplementation at baseline, -was requiring 4 L to maintain O2 saturation greater than 92% on admission -Chest x-ray done on admission which shows cardiomegaly and mild pulmonary edema. -Given a dose of IV Lasix  -Incentive spirometer -SpO2: 94 % O2 Flow Rate (L/min): 2  L/min -Due to concern for pulmonary embolism, started on heparin drip by EDP but now low so stopped  -D-dimer 0.29 and negative so heparin drip has been discontinued -Repeat chest x-ray this AM showed "Increased interstitial and airspace opacities suggestive of worsening edema. Cardiomegaly and pulmonary vascular congestion."   Septic shock with likely Streptococcus bacteremia. -Meets SIRS criteria, WBC greater than 12 K, respiration rate 24, reported temperature of 102. -Hypotensive with MAP in the 50s despite IV fluid bolus, midodrine challenge, and IV fluid hydration. -Started vasopressor, Levophed which is now weaned off -PCCM, Consulted  -UA showed yellow color urine with small leukocytosis, negative nitrites, few bacteria, 0-5 RBCs per high-powered field, and 11-20 WBCs with urine culture pending and blood cultures pending -Follow blood cultures and repeat them today Monitor fever curve and WBC; WBC continues to improve and is now trended down from 16.5 is now 9.5 -> 5.6 -Continue broad-spectrum IV antibiotics for now, cefepime, IV Flagyl and IV vancomycin and now has been changed to IV penicillin -Follow up repeat Blood Cultures   Acute on Chronic Diastolic CHF -BNP 716, cardiomegaly and pulm edema on chest x-ray -Bilateral lower extremity edema; -Will give a dose of IV Lasix now that she is off of pressor and BP is stable -Follow 2D echo and showed EF of 60 to 65% with grade 2 diastolic dysfunction -Start strict I's and O's and daily weight -Repeat chest x-ray in the a.m.   Essential hypertension, currently hypotensive -Requiring vasopressor to maintain MAP greater than 65 but now has been weaned off -Hold off home oral antihypertensives. -Continue  to closely monitor vital signs -Appreciate PCCM assistance    CKD 4 Metabolic Acidosis -At baseline -Patient's BUNs/creatinine went from 39/2.12 is now 42/2.27 -> 45/2.05 -> 38/1.91 -Has a slight metabolic acidosis of 20, anion  gap of 7, chloride level 110 -Avoid nephrotoxic agents and hypotension -Monitor urine output -Avoid further nephrotoxic medications, contrast dyes, hypotension and dehydration to ensure adequate renal perfusion   Type 2 diabetes with hyperglycemia -Obtain hemoglobin A1c and was 5.6 -Start insulin sliding scale -Continue monitor blood sugars per protocol -CBG's ranging from 162-193   Normocytic Anemia -Recommendation hemoglobin/hematocrit went from 10.9/36.6 -> 10.7/36.0 -> 10.0/34.2 -> 9.4/31.8 -Anemia panel was checked and showed an iron level of 21, U IBC of 223, TIBC 244, saturation ratio of 9%, ferritin level 77, folate of 8.6, vitamin B12 917 -Continue to monitor for signs and symptoms of bleeding; no overt bleeding noted -Repeat CBC in a.m.   Foot Wound -Will consult [place] Nurse  Seizure disorder -Resume home AEDs -Continue with seizure precautions   Hyperkalemia -Patient's potassium was 5.3 and is now 4.1 -Give a dose of Lokelma 10 g once -Continue monitor and trend and repeat CMP in a.m.   Hypothyroidism -Resume home levothyroxine -Last TSH 3.30 on 05/14/2021.   Ambulatory dysfunction -Uses a walker to ambulate -Fall precautions -We will need PT and OT to further evaluate and treat and this has been ordered and they are recommending SNF   Constipation/hemorrhoids -Ordered senna docusate 1 tab p.o. twice daily as well as Anusol rectal cream  Obesity -Complicates overall prognosis and care -Estimated body mass index is 34.15 kg/m as calculated from the following:   Height as of this encounter: '5\' 2"'$  (1.575 m).   Weight as of this encounter: 84.7 kg.  -Weight Loss and Dietary Counseling given  DVT prophylaxis: heparin injection 5,000 Units Start: 10/14/21 0815    Code Status: DNR Family Communication: No family currently at bedside  Disposition Plan:  Level of care: Stepdown Status is: Inpatient Remains inpatient appropriate because: Needs further clinical  improvement    Consultants:  PCCM/Pulmonary   Procedures:  As Above  ECHOCARDIOGRAM IMPRESSIONS     1. Left ventricular ejection fraction, by estimation, is 60 to 65%. The  left ventricle has normal function. The left ventricle has no regional  wall motion abnormalities. There is moderate concentric left ventricular  hypertrophy. Left ventricular  diastolic parameters are consistent with Grade II diastolic dysfunction  (pseudonormalization). Elevated left ventricular end-diastolic pressure.  There is the interventricular septum is flattened in systole, consistent  with right ventricular pressure  overload.   2. Right ventricular systolic function is normal. The right ventricular  size is normal. There is severely elevated pulmonary artery systolic  pressure.   3. Right atrial size was mildly dilated.   4. The mitral valve is grossly normal. Trivial mitral valve  regurgitation. No evidence of mitral stenosis.   5. DI 0.31, suggests moderate stenosis. The aortic valve is calcified.  There is moderate calcification of the aortic valve. There is moderate  thickening of the aortic valve. Aortic valve regurgitation is not  visualized. Moderate aortic valve stenosis.   6. The inferior vena cava is dilated in size with <50% respiratory  variability, suggesting right atrial pressure of 15 mmHg.   Comparison(s): Changes from prior study are noted. Grade 2 diastolic  dysfunction with elevated LVEDP and elevated RVSP, moderate aortic  stenosis.   FINDINGS   Left Ventricle: Left ventricular ejection fraction, by estimation, is 60  to 65%. The left ventricle has normal function. The left ventricle has no  regional wall motion abnormalities. The left ventricular internal cavity  size was normal in size. There is   moderate concentric left ventricular hypertrophy. The interventricular  septum is flattened in systole, consistent with right ventricular pressure  overload. Left ventricular  diastolic parameters are consistent with Grade  II diastolic dysfunction  (pseudonormalization). Elevated left ventricular end-diastolic pressure.   Right Ventricle: The right ventricular size is normal. Right vetricular  wall thickness was not well visualized. Right ventricular systolic  function is normal. There is severely elevated pulmonary artery systolic  pressure. The tricuspid regurgitant  velocity is 3.68 m/s, and with an assumed right atrial pressure of 15  mmHg, the estimated right ventricular systolic pressure is 28.7 mmHg.   Left Atrium: Left atrial size was normal in size.   Right Atrium: Right atrial size was mildly dilated.   Pericardium: There is no evidence of pericardial effusion.   Mitral Valve: The mitral valve is grossly normal. Trivial mitral valve  regurgitation. No evidence of mitral valve stenosis.   Tricuspid Valve: The tricuspid valve is normal in structure. Tricuspid  valve regurgitation is mild . No evidence of tricuspid stenosis.   Aortic Valve: DI 0.31, suggests moderate stenosis. The aortic valve is  calcified. There is moderate calcification of the aortic valve. There is  moderate thickening of the aortic valve. Aortic valve regurgitation is not  visualized. Moderate aortic  stenosis is present. Aortic valve mean gradient measures 35.8 mmHg. Aortic  valve peak gradient measures 67.2 mmHg. Aortic valve area, by VTI measures  0.88 cm.   Pulmonic Valve: The pulmonic valve was grossly normal. Pulmonic valve  regurgitation is trivial. No evidence of pulmonic stenosis.   Aorta: The aortic root, ascending aorta, aortic arch and descending aorta  are all structurally normal, with no evidence of dilitation or  obstruction.   Venous: The inferior vena cava is dilated in size with less than 50%  respiratory variability, suggesting right atrial pressure of 15 mmHg.   IAS/Shunts: The atrial septum is grossly normal.      LEFT VENTRICLE  PLAX 2D   LVIDd:         3.90 cm   Diastology  LVIDs:         2.60 cm   LV e' medial:    4.16 cm/s  LV PW:         1.30 cm   LV E/e' medial:  38.0  LV IVS:        1.40 cm   LV e' lateral:   4.53 cm/s  LVOT diam:     1.90 cm   LV E/e' lateral: 34.9  LV SV:         83  LV SV Index:   45  LVOT Area:     2.84 cm      RIGHT VENTRICLE          IVC  RV Basal diam:  2.50 cm  IVC diam: 2.50 cm   LEFT ATRIUM             Index        RIGHT ATRIUM           Index  LA diam:        4.30 cm 2.33 cm/m   RA Area:     18.10 cm  LA Vol (A2C):   44.3 ml 23.97 ml/m  RA Volume:   53.70 ml  29.05 ml/m  LA Vol (A4C):   51.5 ml 27.86 ml/m  LA Biplane Vol: 49.3 ml 26.67 ml/m   AORTIC VALVE                     PULMONIC VALVE  AV Area (Vmax):    0.89 cm      PV Vmax:       0.98 m/s  AV Area (Vmean):   0.86 cm      PV Peak grad:  3.9 mmHg  AV Area (VTI):     0.88 cm  AV Vmax:           409.80 cm/s  AV Vmean:          276.000 cm/s  AV VTI:            0.943 m  AV Peak Grad:      67.2 mmHg  AV Mean Grad:      35.8 mmHg  LVOT Vmax:         129.00 cm/s  LVOT Vmean:        83.400 cm/s  LVOT VTI:          0.294 m  LVOT/AV VTI ratio: 0.31     AORTA  Ao Root diam: 3.20 cm  Ao Asc diam:  2.50 cm   MITRAL VALVE                TRICUSPID VALVE  MV Area (PHT): 4.17 cm     TR Peak grad:   54.2 mmHg  MV Decel Time: 182 msec     TR Vmax:        368.00 cm/s  MR Peak grad: 59.3 mmHg  MR Vmax:      385.00 cm/s   SHUNTS  MV E velocity: 158.00 cm/s  Systemic VTI:  0.29 m  MV A velocity: 110.00 cm/s  Systemic Diam: 1.90 cm  MV E/A ratio:  1.44   Antimicrobials:  Anti-infectives (From admission, onward)    Start     Dose/Rate Route Frequency Ordered Stop   10/16/21 0000  vancomycin (VANCOREADY) IVPB 750 mg/150 mL  Status:  Discontinued        750 mg 150 mL/hr over 60 Minutes Intravenous Every 48 hours 10/14/21 0204 10/14/21 2135   10/14/21 2300  penicillin G potassium 6 Million Units in dextrose 5 % 500 mL  continuous infusion        6 Million Units 41.7 mL/hr over 12 Hours Intravenous Every 12 hours 10/14/21 2136     10/14/21 2200  ceFEPIme (MAXIPIME) 2 g in sodium chloride 0.9 % 100 mL IVPB  Status:  Discontinued        2 g 200 mL/hr over 30 Minutes Intravenous Every 24 hours 10/14/21 0204 10/14/21 2134   10/14/21 1200  metroNIDAZOLE (FLAGYL) IVPB 500 mg  Status:  Discontinued        500 mg 100 mL/hr over 60 Minutes Intravenous Every 12 hours 10/14/21 0204 10/14/21 2135   10/13/21 2345  ceFEPIme (MAXIPIME) 2 g in sodium chloride 0.9 % 100 mL IVPB        2 g 200 mL/hr over 30 Minutes Intravenous  Once 10/13/21 2337 10/14/21 0046   10/13/21 2345  metroNIDAZOLE (FLAGYL) IVPB 500 mg        500 mg 100 mL/hr over 60 Minutes Intravenous  Once 10/13/21 2337 10/14/21 0255   10/13/21 2345  vancomycin (VANCOCIN) IVPB 1000 mg/200 mL premix  1,000 mg 200 mL/hr over 60 Minutes Intravenous  Once 10/13/21 2337 10/14/21 0255       Subjective: Seen and examined at bedside think she is doing okay.  Working with therapy.  No nausea or vomiting.  Denies any lightheadedness.  States her shortness of breath is stable.  Denies any other concerns At this time.  Objective: Vitals:   10/16/21 1207 10/16/21 1300 10/16/21 1527 10/16/21 1700  BP: (!) 134/43 (!) 133/40  (!) 127/33  Pulse: 93 87  77  Resp: (!) 24 19  (!) 22  Temp:   98.9 F (37.2 C)   TempSrc:   Oral   SpO2: 92% 94%  94%  Weight:      Height:        Intake/Output Summary (Last 24 hours) at 10/16/2021 1719 Last data filed at 10/16/2021 1602 Gross per 24 hour  Intake 1044.08 ml  Output 400 ml  Net 644.08 ml   Filed Weights   10/14/21 0130 10/14/21 0350 10/15/21 0311  Weight: 82.9 kg 83.8 kg 84.7 kg   Examination: Physical Exam:  Constitutional: WN/WD obese Caucasian female currently no acute distress Respiratory: Diminished to auscultation bilaterally, n with some crackles but no appreciable rales or rhonchi.  Normal  respiratory effort and patient is not tachypenic. No accessory muscle use.  Wearing supplemental oxygen nasal cannula Cardiovascular: RRR, no murmurs / rubs / gallops. S1 and S2 auscultated.  Has 1+ lower extremity extremity edema Abdomen: Soft, non-tender, distended secondary body habitus. Bowel sounds positive.  GU: Deferred. Musculoskeletal: No clubbing / cyanosis of digits/nails. No joint deformity upper and lower extremities.  Skin: Has a wound on the plantar aspect of her left foot Neurologic: CN 2-12 grossly intact with no focal deficits. Romberg sign and cerebellar reflexes not assessed.  Psychiatric: Normal judgment and insight. Alert and oriented x 3. Normal mood and appropriate affect.   Data Reviewed: I have personally reviewed following labs and imaging studies  CBC: Recent Labs  Lab 10/13/21 2352 10/14/21 0853 10/15/21 0311 10/16/21 0317  WBC 12.5* 16.5* 9.5 5.6  NEUTROABS 10.9* 13.9* 7.2 4.2  HGB 10.9* 10.7* 10.0* 9.4*  HCT 36.6 36.0 34.2* 31.8*  MCV 89.7 90.0 92.9 91.9  PLT 351 372 305 706   Basic Metabolic Panel: Recent Labs  Lab 10/13/21 2352 10/14/21 0853 10/15/21 0311 10/16/21 0317  NA 139 141  141 131*  133* 137  K 4.3 4.3  4.4 5.0  5.3* 4.1  CL 108 112*  112* 104  106 110  CO2 21* 22  22 20*  20* 20*  GLUCOSE 278* 192*  190* 387*  408* 174*  BUN 39* 42*  41* 45*  44* 38*  CREATININE 2.12* 2.27*  2.17* 2.05*  2.04* 1.91*  CALCIUM 9.2 9.1  9.1 8.4*  8.6* 9.4  MG  --  1.9 2.0 2.0  PHOS  --  3.1  3.2 2.9  3.0 2.6   GFR: Estimated Creatinine Clearance: 21.3 mL/min (A) (by C-G formula based on SCr of 1.91 mg/dL (H)). Liver Function Tests: Recent Labs  Lab 10/13/21 2352 10/14/21 0853 10/15/21 0311 10/16/21 0317  AST '27 22 16 '$ 13*  ALT '17 16 15 13  '$ ALKPHOS 65 56 49 49  BILITOT 0.9 0.9 0.9 0.8  PROT 5.9* 5.6* 5.2* 5.2*  ALBUMIN 3.3* 2.9*  2.9* 2.7*  2.7* 2.6*   No results for input(s): "LIPASE", "AMYLASE" in the last 168  hours. No results for input(s): "AMMONIA" in the last 168  hours. Coagulation Profile: Recent Labs  Lab 10/13/21 2352  INR 1.3*   Cardiac Enzymes: No results for input(s): "CKTOTAL", "CKMB", "CKMBINDEX", "TROPONINI" in the last 168 hours. BNP (last 3 results) No results for input(s): "PROBNP" in the last 8760 hours. HbA1C: Recent Labs    10/14/21 0853  HGBA1C 5.6   CBG: Recent Labs  Lab 10/15/21 1845 10/15/21 2159 10/16/21 0716 10/16/21 1205 10/16/21 1527  GLUCAP 168* 193* 162* 188* 183*   Lipid Profile: No results for input(s): "CHOL", "HDL", "LDLCALC", "TRIG", "CHOLHDL", "LDLDIRECT" in the last 72 hours. Thyroid Function Tests: No results for input(s): "TSH", "T4TOTAL", "FREET4", "T3FREE", "THYROIDAB" in the last 72 hours. Anemia Panel: Recent Labs    10/14/21 0853  VITAMINB12 917*  FOLATE 8.6  FERRITIN 77  TIBC 244*  IRON 21*  RETICCTPCT 2.4   Sepsis Labs: Recent Labs  Lab 10/13/21 2352 10/14/21 0144  LATICACIDVEN 2.5* 1.6    Recent Results (from the past 240 hour(s))  Blood Culture (routine x 2)     Status: Abnormal   Collection Time: 10/13/21 11:40 PM   Specimen: BLOOD  Result Value Ref Range Status   Specimen Description   Final    BLOOD BLOOD RIGHT WRIST Performed at [facility] [address], [place], [place] [address]    Special Requests   Final    BOTTLES DRAWN AEROBIC AND ANAEROBIC Blood Culture adequate volume Performed at [facility] [address], [place], [place] [address]    Culture  Setup Time   Final    GRAM POSITIVE COCCI ANAEROBIC BOTTLE ONLY CRITICAL RESULT CALLED TO, READ BACK BY AND VERIFIED WITH: C/ PHARMD MARY S. 10/14/21 2058 A. LAFRANCE Performed at [facility], [place] [address] [place], [place] [address]    Culture GROUP B STREP(S.AGALACTIAE)ISOLATED (A)  Final   Report Status 10/16/2021 FINAL  Final   Organism ID, Bacteria GROUP B STREP(S.AGALACTIAE)ISOLATED  Final       Susceptibility   Group b strep(s.agalactiae)isolated - MIC*    CLINDAMYCIN <=0.25 SENSITIVE Sensitive     AMPICILLIN <=0.25 SENSITIVE Sensitive     ERYTHROMYCIN <=0.12 SENSITIVE Sensitive     VANCOMYCIN 0.5 SENSITIVE Sensitive     CEFTRIAXONE <=0.12 SENSITIVE Sensitive     LEVOFLOXACIN 0.5 SENSITIVE Sensitive     * GROUP B STREP(S.AGALACTIAE)ISOLATED  Blood Culture ID Panel (Reflexed)     Status: Abnormal   Collection Time: 10/13/21 11:40 PM  Result Value Ref Range Status   Enterococcus faecalis NOT DETECTED NOT DETECTED Final   Enterococcus Faecium NOT DETECTED NOT DETECTED Final   Listeria monocytogenes NOT DETECTED NOT DETECTED Final   Staphylococcus species NOT DETECTED NOT DETECTED Final   Staphylococcus aureus (BCID) NOT DETECTED NOT DETECTED Final   Staphylococcus epidermidis NOT DETECTED NOT DETECTED Final   Staphylococcus lugdunensis NOT DETECTED NOT DETECTED Final   Streptococcus species DETECTED (A) NOT DETECTED Final    Comment: CRITICAL RESULT CALLED TO, READ BACK BY AND VERIFIED WITH: C/ PHARMD MARY S. 10/14/21 2058 A. LAFRANCE    Streptococcus agalactiae DETECTED (A) NOT DETECTED Final    Comment: CRITICAL RESULT CALLED TO, READ BACK BY AND VERIFIED WITH: C/ PHARMD MARY S. 10/14/21 2058 A. LAFRANCE    Streptococcus pneumoniae NOT DETECTED NOT DETECTED Final   Streptococcus pyogenes NOT DETECTED NOT DETECTED Final   A.calcoaceticus-baumannii NOT DETECTED NOT DETECTED Final   Bacteroides fragilis NOT DETECTED NOT DETECTED Final   Enterobacterales NOT DETECTED NOT DETECTED Final  Enterobacter cloacae complex NOT DETECTED NOT DETECTED Final   Escherichia coli NOT DETECTED NOT DETECTED Final   Klebsiella aerogenes NOT DETECTED NOT DETECTED Final   Klebsiella oxytoca NOT DETECTED NOT DETECTED Final   Klebsiella pneumoniae NOT DETECTED NOT DETECTED Final   Proteus species NOT DETECTED NOT DETECTED Final   Salmonella species NOT DETECTED NOT DETECTED Final   Serratia  marcescens NOT DETECTED NOT DETECTED Final   Haemophilus influenzae NOT DETECTED NOT DETECTED Final   Neisseria meningitidis NOT DETECTED NOT DETECTED Final   Pseudomonas aeruginosa NOT DETECTED NOT DETECTED Final   Stenotrophomonas maltophilia NOT DETECTED NOT DETECTED Final   Candida albicans NOT DETECTED NOT DETECTED Final   Candida auris NOT DETECTED NOT DETECTED Final   Candida glabrata NOT DETECTED NOT DETECTED Final   Candida krusei NOT DETECTED NOT DETECTED Final   Candida parapsilosis NOT DETECTED NOT DETECTED Final   Candida tropicalis NOT DETECTED NOT DETECTED Final   Cryptococcus neoformans/gattii NOT DETECTED NOT DETECTED Final    Comment: Performed at [facility], [place] [address], [place], [place] [address]  Blood Culture (routine x 2)     Status: None (Preliminary result)   Collection Time: 10/14/21 12:40 AM   Specimen: BLOOD  Result Value Ref Range Status   Specimen Description   Final    BLOOD BLOOD LEFT HAND Performed at [facility] [address] [place], [place] [address]    Special Requests   Final    BOTTLES DRAWN AEROBIC AND ANAEROBIC Blood Culture adequate volume Performed at [facility] [address], [place], [place] [address]    Culture   Final    NO GROWTH 2 DAYS Performed at [facility] [address], [place], [place] [address]    Report Status PENDING  Incomplete  Resp Panel by RT-PCR (Flu A&B, Covid) Anterior Nasal Swab     Status: None   Collection Time: 10/14/21 12:50 AM   Specimen: Anterior Nasal Swab  Result Value Ref Range Status   SARS Coronavirus 2 by RT PCR NEGATIVE NEGATIVE Final    Comment: (NOTE) SARS-CoV-2 target nucleic acids are NOT DETECTED.  The SARS-CoV-2 RNA is generally detectable in upper respiratory specimens during the acute phase of infection. The lowest concentration of SARS-CoV-2 viral copies this assay can detect is 138 copies/mL. A negative result does not  preclude SARS-Cov-2 infection and should not be used as the sole basis for treatment or other patient management decisions. A negative result may occur with  improper specimen collection/handling, submission of specimen other than nasopharyngeal swab, presence of viral mutation(s) within the areas targeted by this assay, and inadequate number of viral copies(<138 copies/mL). A negative result must be combined with clinical observations, patient history, and epidemiological information. The expected result is Negative.  Fact Sheet for Patients:  EntrepreneurPulse.com.au  Fact Sheet for Healthcare Providers:  IncredibleEmployment.be  This test is no t yet approved or cleared by the [place] FDA and  has been authorized for detection and/or diagnosis of SARS-CoV-2 by FDA under an Emergency Use Authorization (EUA). This EUA will remain  in effect (meaning this test can be used) for the duration of the COVID-19 declaration under Section 564(b)(1) of the Act, 21 U.S.C.section 360bbb-3(b)(1), unless the authorization is terminated  or revoked sooner.       Influenza A by PCR NEGATIVE NEGATIVE Final   Influenza B by PCR NEGATIVE NEGATIVE Final    Comment: (NOTE) The Xpert Xpress  SARS-CoV-2/FLU/RSV plus assay is intended as an aid in the diagnosis of influenza from Nasopharyngeal swab specimens and should not be used as a sole basis for treatment. Nasal washings and aspirates are unacceptable for Xpert Xpress SARS-CoV-2/FLU/RSV testing.  Fact Sheet for Patients: EntrepreneurPulse.com.au  Fact Sheet for Healthcare Providers: IncredibleEmployment.be  This test is not yet approved or cleared by the [place] FDA and has been authorized for detection and/or diagnosis of SARS-CoV-2 by FDA under an Emergency Use Authorization (EUA). This EUA will remain in effect (meaning this test can be used) for the  duration of the COVID-19 declaration under Section 564(b)(1) of the Act, 21 U.S.C. section 360bbb-3(b)(1), unless the authorization is terminated or revoked.  Performed at [facility], [place] [address], [place], [place] [address]   MRSA Next Gen by PCR, Nasal     Status: Abnormal   Collection Time: 10/14/21  3:42 AM   Specimen: Nasal Mucosa; Nasal Swab  Result Value Ref Range Status   MRSA by PCR Next Gen DETECTED (A) NOT DETECTED Final    Comment: (NOTE) The GeneXpert MRSA Assay (FDA approved for NASAL specimens only), is one component of a comprehensive MRSA colonization surveillance program. It is not intended to diagnose MRSA infection nor to guide or monitor treatment for MRSA infections. Test performance is not FDA approved in patients less than 19 years old. Performed at [facility], [place] [address], [place], [place] [address]   Urine Culture     Status: None   Collection Time: 10/14/21  9:38 AM   Specimen: In/Out Cath Urine  Result Value Ref Range Status   Specimen Description   Final    IN/OUT CATH URINE Performed at [facility] [address] [place], [place] [address]    Special Requests   Final    NONE Performed at [facility], [place] [address] [place], [place] [address]    Culture   Final    NO GROWTH Performed at [facility], [place] [address], [place], [place] [address]    Report Status 10/15/2021 FINAL  Final  Culture, blood (Routine X 2) w Reflex to ID Panel     Status: None (Preliminary result)   Collection Time: 10/15/21  9:17 AM   Specimen: BLOOD  Result Value Ref Range Status   Specimen Description   Final    BLOOD BLOOD LEFT ARM Performed at [facility] [address] [place], [place] [address]    Special Requests   Final    BOTTLES DRAWN AEROBIC ONLY Blood Culture adequate volume Performed at [facility] [address] [place], [place] [address]    Culture   Final    NO GROWTH < 24 HOURS Performed at [facility] [address], [place], [place] [address]    Report Status PENDING  Incomplete  Culture, blood (Routine X 2) w Reflex to ID Panel     Status: None (Preliminary result)   Collection Time: 10/15/21  9:23 AM   Specimen: BLOOD  Result Value Ref Range Status   Specimen Description   Final    BLOOD BLOOD RIGHT HAND Performed at [facility] [address] [place], [place] [address]    Special Requests   Final    BOTTLES DRAWN AEROBIC AND ANAEROBIC Blood Culture adequate volume Performed at [facility] [address], [place], [place] [address]    Culture  Final    NO GROWTH < 24 HOURS Performed at [facility], [place] [address], [place], [place] [address]    Report Status PENDING  Incomplete     Radiology Studies: DG CHEST PORT 1 VIEW  Result Date: 10/16/2021 CLINICAL DATA:  Dyspnea EXAM: PORTABLE CHEST 1 VIEW COMPARISON:  Radiographs 10/14/2021 FINDINGS: Stable cardiomegaly. Pulmonary vascular congestion. Slightly increased hazy interstitial and airspace opacities bilaterally suggestive of edema. Prominent central pulmonary arteries. Aortic calcification. No acute osseous abnormality. IMPRESSION: Increased interstitial and airspace opacities suggestive of worsening edema. Cardiomegaly and pulmonary vascular congestion. Electronically Signed   By: Placido Sou M.D.   On: 10/16/2021 08:11     Scheduled Meds:  allopurinol  100 mg Oral BID   ascorbic acid  500 mg Oral Daily   aspirin EC  81 mg Oral Daily   Chlorhexidine Gluconate Cloth  6 each Topical Q0600   vitamin B-12  500 mcg Oral Daily   diclofenac Sodium  2 g Topical QID   heparin  5,000 Units Subcutaneous Q8H   hydrocortisone   Rectal BID   insulin aspart  0-5 Units Subcutaneous QHS   insulin aspart  0-9 Units Subcutaneous TID WC   levETIRAcetam  500 mg Oral BID    levothyroxine  100 mcg Oral QAC breakfast   loratadine  10 mg Oral Daily   mupirocin ointment  1 Application Nasal BID   omega-3 acid ethyl esters  1 g Oral QHS   pantoprazole  20 mg Oral Daily   Ensure Max Protein  11 oz Oral Daily   senna-docusate  1 tablet Oral BID   Continuous Infusions:  sodium chloride Stopped (10/15/21 1932)   norepinephrine (LEVOPHED) Adult infusion Stopped (10/15/21 0803)   penicillin G potassium 6 Million Units in dextrose 5 % 500 mL continuous infusion 41.7 mL/hr at 10/16/21 1602    LOS: 2 days   Raiford Noble, DO [facility] Available via Epic secure chat 7am-7pm After these hours, please refer to coverage provider listed on [email or web address] 10/16/2021, 5:19 PM

## 2021-10-16 NOTE — NC FL2 (Addendum)
[place] LEVEL OF CARE SCREENING TOOL     IDENTIFICATION  Patient Name: [name] [name] Birthdate: 04-12-1932 Sex: female Admission Date (Current Location): 10/13/2021  [facility] and [place] Number:  Herbalist and Address:  [facility]  [place], [place]      Provider Number: [id number]  Attending Physician Name and Address:  Kerney Elbe, DO  Relative Name and Phone Number:  Mimie, Goering   [phone]  cole,maxine Sister   [phone]    Current Level of Care: Hospital Recommended Level of Care: [place] Prior Approval Number:    Date Approved/Denied:   PASRR Number: [id number]  Discharge Plan: SNF    Current Diagnoses: Patient Active Problem List   Diagnosis Date Noted   Acute respiratory failure with hypoxia ([place]) 10/14/2021   Benign neoplasm of cecum    Benign neoplasm of rectum    Blood in stool    Melena    Acute blood loss anemia    GI bleed 04/04/2020   Symptomatic anemia 11/02/2019   DM neuropathy, type II diabetes mellitus (HCC)    Stage 4 chronic kidney disease (HCC)    Obesity (BMI 30-39.9)    High cholesterol    Aortic valve thickening by Cardiac echo in 2015 11/01/2019   Iron deficiency anemia 05/10/2019   Secondary hyperparathyroidism of renal origin ([place]) 09/20/2018   Type 2 diabetes mellitus with stage 4 chronic kidney disease, with long-term current use of insulin ([place]) 12/08/2017   ACE inhibitor intolerance 12/08/2017   Vertigo of central origin 11/20/2015   Morbid obesity ([place]) BMI 35+ with T2DM, htn, hyperlipidemia, CKD, OSA 12/12/2014   OSA on CPAP 08/08/2014   Type II diabetes mellitus with neurological manifestations ([place]) 09/15/2013   Hyperlipidemia associated with type 2 diabetes mellitus ([place]) 06/27/2013   Vitamin D deficiency 06/27/2013   Seizure disorder ([place]) 11/04/2012   Epilepsy ([place]) 11/04/2012   Anemia 06/13/2008   COLONIC POLYPS 06/09/2008    Hypothyroidism 06/09/2008   CKD stage 4 due to type 2 diabetes mellitus ([place]) 06/09/2008   Venous (peripheral) insufficiency 06/09/2008   Gastroesophageal reflux disease 06/09/2008   Essential hypertension 06/09/2008    Orientation RESPIRATION BLADDER Height & Weight     Time, Situation, Place, Self  O2 (2L) Continent Weight: 186 lb 11.7 oz (84.7 kg) Height:  '5\' 2"'$  (157.5 cm)  BEHAVIORAL SYMPTOMS/MOOD NEUROLOGICAL BOWEL NUTRITION STATUS      Continent Diet  AMBULATORY STATUS COMMUNICATION OF NEEDS Skin   Limited Assist Verbally Normal                       Personal Care Assistance Level of Assistance  Bathing, Dressing, Feeding Bathing Assistance: Limited assistance Feeding assistance: Independent Dressing Assistance: Limited assistance     Functional Limitations Info  Sight, Hearing, Speech Sight Info: Adequate Hearing Info: Adequate Speech Info: Adequate    SPECIAL CARE FACTORS FREQUENCY  PT (By licensed PT), OT (By licensed OT)     PT Frequency: Minimum 5x a week OT Frequency: Minimum 5x a week            Contractures Contractures Info: Not present    Additional Factors Info  Code Status Code Status Info: DNR             Current Medications (10/16/2021):  This is the current hospital active medication list Current Facility-Administered Medications  Medication Dose Route Frequency Provider Last Rate Last Admin  0.9 %  sodium chloride infusion  250 mL Intravenous Continuous Irene Pap N, DO   Stopped at 10/15/21 1932   acetaminophen (TYLENOL) tablet 650 mg  650 mg Oral Q6H PRN Kayleen Memos, DO       allopurinol (ZYLOPRIM) tablet 100 mg  100 mg Oral BID Sheikh, Omair Bolt, DO   100 mg at 10/16/21 2876   ascorbic acid (VITAMIN C) tablet 500 mg  500 mg Oral Daily Raiford Noble [place], DO   500 mg at 10/16/21 8115   aspirin EC tablet 81 mg  81 mg Oral Daily Raiford Noble [place], DO   81 mg at 10/16/21 7262   Chlorhexidine Gluconate Cloth 2 % PADS 6  each  6 each Topical Q0600 Kayleen Memos, DO   6 each at 10/16/21 1020   cyanocobalamin (VITAMIN B12) tablet 500 mcg  500 mcg Oral Daily Raiford Noble [place], DO   500 mcg at 10/16/21 0355   diclofenac Sodium (VOLTAREN) 1 % topical gel 2 g  2 g Topical QID Raiford Noble [place], DO   2 g at 10/16/21 1320   heparin injection 5,000 Units  5,000 Units Subcutaneous Q8H Collier Bullock, MD   5,000 Units at 10/16/21 1320   hydrocortisone (ANUSOL-HC) 2.5 % rectal cream   Rectal BID Raiford Noble Arthur, DO   Given at 10/16/21 9741   insulin aspart (novoLOG) injection 0-5 Units  0-5 Units Subcutaneous QHS Irene Pap N, DO       insulin aspart (novoLOG) injection 0-9 Units  0-9 Units Subcutaneous TID WC Irene Pap N, DO   2 Units at 10/16/21 1217   levETIRAcetam (KEPPRA) tablet 500 mg  500 mg Oral BID Irene Pap N, DO   500 mg at 10/16/21 0800   levothyroxine (SYNTHROID) tablet 100 mcg  100 mcg Oral QAC breakfast Irene Pap N, DO   100 mcg at 10/16/21 0801   loratadine (CLARITIN) tablet 10 mg  10 mg Oral Daily Raiford Noble Spicer, DO   10 mg at 10/16/21 0915   melatonin tablet 5 mg  5 mg Oral QHS PRN Irene Pap N, DO   5 mg at 10/15/21 2120   mupirocin ointment (BACTROBAN) 2 % 1 Application  1 Application Nasal BID Raiford Noble Rigby, DO   1 Application at 63/84/53 6468   norepinephrine (LEVOPHED) '4mg'$  in 263m (0.016 mg/mL) premix infusion  2-10 mcg/min Intravenous Titrated HIrene PapN, DO   Stopped at 10/15/21 00321  omega-3 acid ethyl esters (LOVAZA) capsule 1 g  1 g Oral QHS Sheikh, OGeorgina QuintLRidgely DO   1 g at 10/15/21 2247   Oral care mouth rinse  15 mL Mouth Rinse PRN HIrene PapN, DO       pantoprazole (PROTONIX) EC tablet 20 mg  20 mg Oral Daily SRaiford NobleLWashburn DO   20 mg at 10/16/21 02248  penicillin G potassium 6 Million Units in dextrose 5 % 500 mL continuous infusion  6 Million Units Intravenous Q12H KRise Patience MD 41.7 mL/hr at 10/16/21 1121 6 Million Units at 10/16/21  1121   prochlorperazine (COMPAZINE) injection 10 mg  10 mg Intravenous Q6H PRN HIrene PapN, DO       protein supplement (ENSURE MAX) liquid  11 oz Oral Daily SRaiford NobleLGladeville DNevada  11 oz at 10/16/21 02500  rOPINIRole (REQUIP) tablet 0.5 mg  0.5 mg Oral TID PRN SRaiford NobleLatif, DO   0.5 mg at 10/16/21 13704  senna-docusate (Senokot-S) tablet 1 tablet  1 tablet Oral BID Raiford Noble [place], [place]   1 tablet at 10/16/21 0721     Discharge Medications: Please see discharge summary for a list of discharge medications.  Relevant Imaging Results:  Relevant Lab Results:   Additional Information SSN [id number]  Ross Ludwig, LCSW

## 2021-10-16 NOTE — Evaluation (Signed)
Physical Therapy Evaluation Patient Details Name: [name] [name] MRN: [id number] DOB: [birth year] Today's Date: 10/16/2021  History of Present Illness  Patient is a 88 year old female who presented to the hosptial with hypoxia, shakiness, chills, and fever. patient was admitted with acute hypoxic respiratory failure secondary to pulomary edema, septic shock,  PMH: hypertension, diabetes mellitus type 2, CKD stage IV, hypothyroidism, seizure disorder, hyperlipidemia, iron deficiency anemia, GERD, vitamin D deficiency, myeloproliferative neoplasm.  Clinical Impression  The patient  admitted for above problems.  Patient resides alone in apartment. Patient reports being on wait list for AL level at [facility].  Patient prefers return to her home vs Rehab again.  Patient ambulated x 20' on 2 l, SPO2 91%.  Patient reports dizziness when up and ambulating. Pt admitted with above diagnosis.  Pt currently with functional limitations due to the deficits listed below (see PT Problem List). Pt will benefit from skilled PT to increase their independence and safety with mobility to allow discharge to the venue listed below.          Recommendations for follow up therapy are one component of a multi-disciplinary discharge planning process, led by the attending physician.  Recommendations may be updated based on patient status, additional functional criteria and insurance authorization.  Follow Up Recommendations Skilled nursing-short term rehab (<3 hours/day) unless improves to return to her home with HHPT Can patient physically be transported by private vehicle: Yes    Assistance Recommended at Discharge Intermittent Supervision/Assistance  Patient can return home with the following  A little help with walking and/or transfers;A little help with bathing/dressing/bathroom;Help with stairs or ramp for entrance;Assist for transportation;Assistance with cooking/housework    Equipment Recommendations None  recommended by PT  Recommendations for Other Services       Functional Status Assessment Patient has had a recent decline in their functional status and demonstrates the ability to make significant improvements in function in a reasonable and predictable amount of time.     Precautions / Restrictions Precautions Precaution Comments: monitor O2 Restrictions Weight Bearing Restrictions: No      Mobility  Bed Mobility Overal bed mobility: Modified Independent                  Transfers Overall transfer level: Needs assistance Equipment used: Rolling walker (2 wheels) Transfers: Sit to/from Stand, Bed to chair/wheelchair/BSC Sit to Stand: Min guard   Step pivot transfers: Min guard       General transfer comment: pt reported feeling dizzy upon standing so  transferred to [facility].    Ambulation/Gait Ambulation/Gait assistance: Min guard Gait Distance (Feet): 20 Feet Assistive device: Rolling walker (2 wheels) Gait Pattern/deviations: Step-through pattern Gait velocity: decr     General Gait Details: pt reported dizziness when ambulating ,  patient steady with RW  Stairs            Wheelchair Mobility    Modified Rankin (Stroke Patients Only)       Balance Overall balance assessment: Mild deficits observed, not formally tested                                           Pertinent Vitals/Pain Pain Assessment Pain Assessment: No/denies pain    Home Living Family/patient expects to be discharged to:: Private residence Living Arrangements: Alone   Type of Home: House  Home Layout: One level Home Equipment: Rollator (4 wheels);Rolling Environmental consultant (2 wheels) Additional Comments: has golf cart, lives in a [place]  at [employer]    Prior Function Prior Level of Function : Independent/Modified Independent                     Hand Dominance   Dominant Hand: Right    Extremity/Trunk Assessment   Upper Extremity  Assessment Upper Extremity Assessment: Overall WFL for tasks assessed    Lower Extremity Assessment Lower Extremity Assessment: Generalized weakness    Cervical / Trunk Assessment Cervical / Trunk Assessment: Normal  Communication   Communication: HOH  Cognition                                                General Comments      Exercises     Assessment/Plan    PT Assessment Patient needs continued PT services  PT Problem List Decreased strength;Decreased mobility;Decreased safety awareness;Decreased activity tolerance;Cardiopulmonary status limiting activity;Decreased balance;Decreased knowledge of use of DME       PT Treatment Interventions DME instruction;Therapeutic activities;Gait training;Therapeutic exercise;Patient/family education;Functional mobility training    PT Goals (Current goals can be found in the Care Plan section)  Acute Rehab PT Goals Patient Stated Goal: Wants to return to her [facility] at [facility] PT Goal Formulation: With patient Time For Goal Achievement: 10/30/21 Potential to Achieve Goals: Good    Frequency Min 3X/week     Co-evaluation PT/OT/SLP Co-Evaluation/Treatment: Yes Reason for Co-Treatment: For patient/therapist safety;To address functional/ADL transfers PT goals addressed during session: Mobility/safety with mobility OT goals addressed during session: ADL's and self-care       AM-PAC PT "6 Clicks" Mobility  Outcome Measure Help needed turning from your back to your side while in a flat bed without using bedrails?: None Help needed moving from lying on your back to sitting on the side of a flat bed without using bedrails?: A Little Help needed moving to and from a bed to a chair (including a wheelchair)?: A Little Help needed standing up from a chair using your arms (e.g., wheelchair or bedside chair)?: A Little Help needed to walk in hospital room?: A Little Help needed climbing 3-5 steps with a  railing? : A Lot 6 Click Score: 18    End of Session Equipment Utilized During Treatment: Oxygen Activity Tolerance: Treatment limited secondary to medical complications (Comment) (dizzy) Patient left: in chair;with call bell/phone within reach;with chair alarm set Nurse Communication: Mobility status PT Visit Diagnosis: Unsteadiness on feet (R26.81)    Time: 2542-7062 PT Time Calculation (min) (ACUTE ONLY): 25 min   Charges:   PT Evaluation $PT Eval Low Complexity: 1 Low          [place] Office [phone] Weekend pager-[phone]   Claretha Cooper 10/16/2021, 2:06 PM

## 2021-10-16 NOTE — Evaluation (Signed)
Occupational Therapy Evaluation Patient Details Name: [name] [name] MRN: [id number] DOB: [birth year] Today's Date: 10/16/2021   History of Present Illness Patient is a 88 year old female who presented to the hosptial with hypoxia, shakiness, chills, and fever. patient was admitted with acute hypoxic respiratory failure secondary to pulomary edema, septic shock,  PMH: hypertension, diabetes mellitus type 2, CKD stage IV, hypothyroidism, seizure disorder, hyperlipidemia, iron deficiency anemia, GERD, vitamin D deficiency, myeloproliferative neoplasm.   Clinical Impression   Patient is a 88 year old female who was admitted for above. Patient reported living at home in [place] alone prior to June. Patient reported plan to transition to ALF when spot becomes available at [facility]. Patient was noted to have decreased functional activity tolerance, decreased endurance, decreased standing balance, decreased safety awareness, and decreased knowledge of AD/AE impacting participation in ADLs. Patient would continue to benefit from skilled OT services at this time while admitted and after d/c to address noted deficits in order to improve overall safety and independence in ADLs.        Recommendations for follow up therapy are one component of a multi-disciplinary discharge planning process, led by the attending physician.  Recommendations may be updated based on patient status, additional functional criteria and insurance authorization.   Follow Up Recommendations  Skilled nursing-short term rehab (<3 hours/day)    Assistance Recommended at Discharge Frequent or constant Supervision/Assistance  Patient can return home with the following A little help with walking and/or transfers;A little help with bathing/dressing/bathroom;Assistance with cooking/housework;Direct supervision/assist for financial management;Direct supervision/assist for medications management;Help with stairs or ramp for  entrance    Functional Status Assessment  Patient has had a recent decline in their functional status and demonstrates the ability to make significant improvements in function in a reasonable and predictable amount of time.  Equipment Recommendations  None recommended by OT    Recommendations for Other Services       Precautions / Restrictions Precautions Precaution Comments: monitor O2 Restrictions Weight Bearing Restrictions: No      Mobility Bed Mobility                    Transfers                          Balance Overall balance assessment: Mild deficits observed, not formally tested                                         ADL either performed or assessed with clinical judgement   ADL Overall ADL's : Needs assistance/impaired Eating/Feeding: Set up;Sitting   Grooming: Set up;Sitting   Upper Body Bathing: Minimal assistance;Sitting   Lower Body Bathing: Maximal assistance;Sitting/lateral leans   Upper Body Dressing : Minimal assistance;Sitting   Lower Body Dressing: Maximal assistance;Sitting/lateral leans Lower Body Dressing Details (indicate cue type and reason): patietn needed max A to don bilateral socks sitting EOB Toilet Transfer: Minimal assistance;Cueing for sequencing;Ambulation;Rolling walker (2 wheels) Toilet Transfer Details (indicate cue type and reason): with increased time Toileting- Water quality scientist and Hygiene: Min guard;Sit to/from stand Toileting - Clothing Manipulation Details (indicate cue type and reason): with RW             Vision Patient Visual Report: No change from baseline       Perception     Praxis  Pertinent Vitals/Pain Pain Assessment Pain Assessment: No/denies pain     Hand Dominance Right   Extremity/Trunk Assessment Upper Extremity Assessment Upper Extremity Assessment: Overall WFL for tasks assessed   Lower Extremity Assessment Lower Extremity Assessment:  Defer to PT evaluation   Cervical / Trunk Assessment Cervical / Trunk Assessment: Normal   Communication Communication Communication: No difficulties   Cognition Arousal/Alertness: Awake/alert Behavior During Therapy: WFL for tasks assessed/performed Overall Cognitive Status: Within Functional Limits for tasks assessed                                       General Comments       Exercises     Shoulder Instructions      Home Living Family/patient expects to be discharged to:: Private residence Living Arrangements: Alone   Type of Home: House       Home Layout: One level               Home Equipment: Rollator (4 wheels);Rolling Walker (2 wheels)   Additional Comments: has golf cart      Prior Functioning/Environment Prior Level of Function : Independent/Modified Independent                        OT Problem List: Decreased activity tolerance;Impaired balance (sitting and/or standing);Decreased safety awareness;Cardiopulmonary status limiting activity;Decreased knowledge of precautions;Decreased knowledge of use of DME or AE      OT Treatment/Interventions: Self-care/ADL training;Therapeutic exercise;Neuromuscular education;Energy conservation;Therapeutic activities;DME and/or AE instruction;Balance training;Patient/family education    OT Goals(Current goals can be found in the care plan section) Acute Rehab OT Goals Patient Stated Goal: [place] OT Goal Formulation: With patient Time For Goal Achievement: 10/30/21 Potential to Achieve Goals: Fair  OT Frequency: Min 2X/week    Co-evaluation PT/OT/SLP Co-Evaluation/Treatment: Yes Reason for Co-Treatment: For patient/therapist safety;To address functional/ADL transfers PT goals addressed during session: Mobility/safety with mobility OT goals addressed during session: ADL's and self-care      AM-PAC OT "6 Clicks" Daily Activity     Outcome Measure Help from another  person eating meals?: None Help from another person taking care of personal grooming?: A Little Help from another person toileting, which includes using toliet, bedpan, or urinal?: A Lot Help from another person bathing (including washing, rinsing, drying)?: A Lot Help from another person to put on and taking off regular upper body clothing?: A Little Help from another person to put on and taking off regular lower body clothing?: A Lot 6 Click Score: 16   End of Session Equipment Utilized During Treatment: Rolling walker (2 wheels);Oxygen Nurse Communication: Mobility status  Activity Tolerance: Patient tolerated treatment well Patient left: in chair;with call bell/phone within reach;with chair alarm set  OT Visit Diagnosis: Unsteadiness on feet (R26.81);Other abnormalities of gait and mobility (R26.89);History of falling (Z91.81)                Time: 3016-0109 OT Time Calculation (min): 25 min Charges:  OT General Charges $OT Visit: 1 Visit OT Evaluation $OT Eval Moderate Complexity: 1 Mod  Raegen Tarpley OTR/L, MS Acute Rehabilitation Department Office# [phone]   Marcellina Millin 10/16/2021, 12:31 PM

## 2021-10-16 NOTE — Progress Notes (Signed)
PT Cancellation Note  Patient Details Name: [name] [name] MRN: [id number] DOB: [birth year]   Cancelled Treatment:    Reason Eval/Treat Not Completed: Other (comment), per RN, patient has been up and back to bed and now on [facility] Will check back another time  [place] Office [phone] Weekend pager-[phone]  Claretha Cooper 10/16/2021, 10:30 AM

## 2021-10-16 NOTE — TOC Initial Note (Signed)
Transition of Care [facility]) - Initial/Assessment Note    Patient Details  Name: [name] [name] MRN: [id number] Date of Birth: [birth year]  Transition of Care [facility]) CM/SW Contact:    Ross Ludwig, LCSW Phone Number: 10/16/2021, 4:56 PM  Clinical Narrative:                  Patient is an 88 year old female who is alert and oriented x4.  Patient is from [facility] facility, plan is to go to SNF for rehab before returning back to her Ind Living.  CSW attempted to speak to patient, but she was sleeping, CSW also tried calling her son, and left a message on voice mail to discuss SNF recommendations.  CSW awaiting for a call back.  Expected Discharge Plan: Skilled Nursing Facility Barriers to Discharge: Continued Medical Work up   Patient Goals and CMS Choice   CMS Medicare.gov Compare Post Acute Care list provided to:: Patient Choice offered to / list presented to : Patient  Expected Discharge Plan and Services Expected Discharge Plan: [place] arrangements for the past 2 months: [place]                                      Prior Living Arrangements/Services Living arrangements for the past 2 months: [place] Lives with:: Facility Resident Patient language and need for interpreter reviewed:: Yes Do you feel safe going back to the place where you live?: No   Patient will need rehab before returning back to [place]  Need for Family Participation in Patient Care: No (Comment) Care giver support system in place?: Yes (comment)   Criminal Activity/Legal Involvement Pertinent to Current Situation/Hospitalization: No - Comment as needed  Activities of Daily Living Home Assistive Devices/Equipment: Walker (specify type), Cane (specify quad or straight), Built-in shower seat, Raised toilet seat with rails (rolling walker) ADL Screening (condition at time of  admission) Patient's cognitive ability adequate to safely complete daily activities?: Yes Is the patient deaf or have difficulty hearing?: Yes Does the patient have difficulty seeing, even when wearing glasses/contacts?: No Does the patient have difficulty concentrating, remembering, or making decisions?: No Patient able to express need for assistance with ADLs?: Yes Does the patient have difficulty dressing or bathing?: No Independently performs ADLs?: Yes (appropriate for developmental age) Does the patient have difficulty walking or climbing stairs?: No Weakness of Legs: None Weakness of Arms/Hands: None  Permission Sought/Granted Permission sought to share information with : Case Manager, Customer service manager, Family Supports Permission granted to share information with : Yes, Verbal Permission Granted, Yes, Release of Information Signed  Share Information with NAME: Meshawn, O[place]nor   [phone]  cole,maxine Sister   [phone]  Permission granted to share info w AGENCY: SNF admissions        Emotional Assessment Appearance:: Appears stated age Attitude/Demeanor/Rapport: Engaged Affect (typically observed): Accepting, Calm Orientation: : Oriented to Self, Oriented to Place, Oriented to  Time, Oriented to Situation Alcohol / Substance Use: Not Applicable Psych Involvement: No (comment)  Admission diagnosis:  Acute respiratory failure with hypoxia ([place]) [J96.01] Patient Active Problem List   Diagnosis Date Noted   Acute respiratory failure with hypoxia ([place]) 10/14/2021   Benign neoplasm of cecum    Benign neoplasm of rectum    Blood in stool    Melena  Acute blood loss anemia    GI bleed 04/04/2020   Symptomatic anemia 11/02/2019   DM neuropathy, type II diabetes mellitus (HCC)    Stage 4 chronic kidney disease (HCC)    Obesity (BMI 30-39.9)    High cholesterol    Aortic valve thickening by Cardiac echo in 2015 11/01/2019   Iron deficiency anemia  05/10/2019   Secondary hyperparathyroidism of renal origin ([place]) 09/20/2018   Type 2 diabetes mellitus with stage 4 chronic kidney disease, with long-term current use of insulin ([place]) 12/08/2017   ACE inhibitor intolerance 12/08/2017   Vertigo of central origin 11/20/2015   Morbid obesity ([place]) BMI 35+ with T2DM, htn, hyperlipidemia, CKD, OSA 12/12/2014   OSA on CPAP 08/08/2014   Type II diabetes mellitus with neurological manifestations ([place]) 09/15/2013   Hyperlipidemia associated with type 2 diabetes mellitus ([place]) 06/27/2013   Vitamin D deficiency 06/27/2013   Seizure disorder ([place]) 11/04/2012   Epilepsy ([place]) 11/04/2012   Anemia 06/13/2008   COLONIC POLYPS 06/09/2008   Hypothyroidism 06/09/2008   CKD stage 4 due to type 2 diabetes mellitus ([place]) 06/09/2008   Venous (peripheral) insufficiency 06/09/2008   Gastroesophageal reflux disease 06/09/2008   Essential hypertension 06/09/2008   PCP:  Unk Pinto, MD Pharmacy:   [facility] [place], [place] [address] [place] AT [facility] [address] [place] [address] Phone: [phone] Fax: [phone]  [facility] SERVICE) [place], [place] [address] Phone: [phone] Fax: [phone]     Social Determinants of Health (SDOH) Interventions    Readmission Risk Interventions     No data to display

## 2021-10-17 DIAGNOSIS — R7881 Bacteremia: Secondary | ICD-10-CM | POA: Diagnosis not present

## 2021-10-17 DIAGNOSIS — B951 Streptococcus, group B, as the cause of diseases classified elsewhere: Secondary | ICD-10-CM

## 2021-10-17 DIAGNOSIS — J9601 Acute respiratory failure with hypoxia: Secondary | ICD-10-CM | POA: Diagnosis not present

## 2021-10-17 DIAGNOSIS — A419 Sepsis, unspecified organism: Secondary | ICD-10-CM | POA: Diagnosis not present

## 2021-10-17 LAB — CBC WITH DIFFERENTIAL/PLATELET
Abs Immature Granulocytes: 0.07 10*3/uL (ref 0.00–0.07)
Basophils Absolute: 0 10*3/uL (ref 0.0–0.1)
Basophils Relative: 1 %
Eosinophils Absolute: 0.4 10*3/uL (ref 0.0–0.5)
Eosinophils Relative: 10 %
HCT: 31.6 % — ABNORMAL LOW (ref 36.0–46.0)
Hemoglobin: 9.5 g/dL — ABNORMAL LOW (ref 12.0–15.0)
Immature Granulocytes: 2 %
Lymphocytes Relative: 15 %
Lymphs Abs: 0.6 10*3/uL — ABNORMAL LOW (ref 0.7–4.0)
MCH: 27.5 pg (ref 26.0–34.0)
MCHC: 30.1 g/dL (ref 30.0–36.0)
MCV: 91.3 fL (ref 80.0–100.0)
Monocytes Absolute: 0.2 10*3/uL (ref 0.1–1.0)
Monocytes Relative: 6 %
Neutro Abs: 2.7 10*3/uL (ref 1.7–7.7)
Neutrophils Relative %: 66 %
Platelets: 278 10*3/uL (ref 150–400)
RBC: 3.46 MIL/uL — ABNORMAL LOW (ref 3.87–5.11)
RDW: 19.6 % — ABNORMAL HIGH (ref 11.5–15.5)
WBC: 4.1 10*3/uL (ref 4.0–10.5)
nRBC: 0 % (ref 0.0–0.2)

## 2021-10-17 LAB — GLUCOSE, CAPILLARY
Glucose-Capillary: 163 mg/dL — ABNORMAL HIGH (ref 70–99)
Glucose-Capillary: 198 mg/dL — ABNORMAL HIGH (ref 70–99)
Glucose-Capillary: 135 mg/dL — ABNORMAL HIGH (ref 70–99)
Glucose-Capillary: 169 mg/dL — ABNORMAL HIGH (ref 70–99)

## 2021-10-17 LAB — COMPREHENSIVE METABOLIC PANEL
ALT: 13 U/L (ref 0–44)
AST: 18 U/L (ref 15–41)
Albumin: 2.7 g/dL — ABNORMAL LOW (ref 3.5–5.0)
Alkaline Phosphatase: 54 U/L (ref 38–126)
Anion gap: 6 (ref 5–15)
BUN: 33 mg/dL — ABNORMAL HIGH (ref 8–23)
CO2: 22 mmol/L (ref 22–32)
Calcium: 9.5 mg/dL (ref 8.9–10.3)
Chloride: 110 mmol/L (ref 98–111)
Creatinine, Ser: 1.73 mg/dL — ABNORMAL HIGH (ref 0.44–1.00)
GFR, Estimated: 28 mL/min — ABNORMAL LOW (ref >60)
Glucose, Bld: 172 mg/dL — ABNORMAL HIGH (ref 70–99)
Potassium: 4.1 mmol/L (ref 3.5–5.1)
Sodium: 138 mmol/L (ref 135–145)
Total Bilirubin: 0.8 mg/dL (ref 0.3–1.2)
Total Protein: 5.6 g/dL — ABNORMAL LOW (ref 6.5–8.1)

## 2021-10-17 LAB — MAGNESIUM: Magnesium: 2.1 mg/dL (ref 1.7–2.4)

## 2021-10-17 LAB — PHOSPHORUS: Phosphorus: 2.7 mg/dL (ref 2.5–4.6)

## 2021-10-17 MED ORDER — FUROSEMIDE 10 MG/ML IJ SOLN
40.0000 mg | Freq: Once | INTRAMUSCULAR | Status: AC
  Administered 2021-10-17: 40 mg via INTRAVENOUS
  Filled 2021-10-17: qty 4

## 2021-10-17 NOTE — Consult Note (Signed)
[facility] for Infectious Disease       Reason for Consult: group B Streptococcus bacteremia with sepsis   Referring Physician: Dr. Cruzita Lederer  Principal Problem:   Acute respiratory failure with hypoxia (HCC)    allopurinol  100 mg Oral BID   ascorbic acid  500 mg Oral Daily   aspirin EC  81 mg Oral Daily   Chlorhexidine Gluconate Cloth  6 each Topical Q0600   vitamin B-12  500 mcg Oral Daily   diclofenac Sodium  2 g Topical QID   heparin  5,000 Units Subcutaneous Q8H   hydrocortisone   Rectal BID   insulin aspart  0-5 Units Subcutaneous QHS   insulin aspart  0-9 Units Subcutaneous TID WC   levETIRAcetam  500 mg Oral BID   levothyroxine  100 mcg Oral QAC breakfast   loratadine  10 mg Oral Daily   mupirocin ointment  1 Application Nasal BID   omega-3 acid ethyl esters  1 g Oral QHS   pantoprazole  20 mg Oral Daily   Ensure Max Protein  11 oz Oral Daily   senna-docusate  1 tablet Oral BID    Recommendations: Continue with penicillin Will monitor repeat blood cultures  Assessment: She has GBS bacteremia of unclear etiology.  This was acute in onset.  No cellulitis, does not appear to be pneumonia with only vascular congestion.  She is improving.  TTE without sigificant concerns from a vegetation standpoint.  At this point, I recommend continue with penicillin, monitor repeat blood cultures and if they stay negative, will plan on transitioning to oral amoxicillin.     Antibiotics: Day 5 total antibiotics  HPI: [name] [name] is a 88 y.o. female with a history of JAK 2 positive MPN, hypertension, diabetes, chronic kidney disease came in with acute onset fever and rigors at home.  Temperature up to 102.  One blood culture set positive for group B Strep, other set remains negative and follow up cultures ngtd.  Admitted initially in shock with pressor requirement. She is much improved now.  She is short of breath and on oxygen.  Her CXR is c/w pulmonary congestion with no  consolidation.   The TTE shows no valvular vegetation.    Review of Systems:  Gastrointestinal: negative for nausea, vomiting, diarrhea, and constipation Integument/breast: negative for rash All other systems reviewed and are negative    Past Medical History:  Diagnosis Date   Anemia, unspecified    Arthritis    "knees; right shoulder" (09/15/2013)   Basal cell carcinoma    "right upper outer lip"   DM neuropathy, type II diabetes mellitus ([place])    Dyspnea 2021   with low iron   GERD (gastroesophageal reflux disease)    Gout    High cholesterol    Hypertension    Hypothyroidism    Meniere's disease    Obesity (BMI 30-39.9)    Other forms of epilepsy and recurrent seizures without mention of intractable epilepsy 11/04/2012   Non convulsive paroxysmal spells, responding to [facility], patient not driving.    Pneumonia ~ 1943   Skin cancer    "forehead; right hand"   Stage 4 chronic kidney disease ([place])    UTI (urinary tract infection)    Vitamin D deficiency     Social History   Tobacco Use   Smoking status: Former    Packs/day: 1.00    Years: 30.00    Total pack years: 30.00    Types:  Cigarettes    Quit date: 06/29/1983    Years since quitting: 38.3   Smokeless tobacco: Never  Vaping Use   Vaping Use: Never used  Substance Use Topics   Alcohol use: Yes    Alcohol/week: 0.0 standard drinks of alcohol    Comment: 09/15/2013 "glass of wine maybe once/year"   Drug use: No    Family History  Problem Relation Age of Onset   Heart disease Mother    Stroke Mother    Heart disease Father    Ovarian cancer Sister    Hypertension Son    Hypertension Son    Diabetes Son    Alcohol abuse Son     Allergies  Allergen Reactions   Ppd [Tuberculin Purified Protein Derivative] Other (See Comments)    indurated    Physical Exam: Constitutional: in no apparent distress  Vitals:   10/17/21 1158 10/17/21 1200  BP:  (!) 126/102  Pulse:  (!) 102  Resp:  (!) 27  Temp:  97.8 F (36.6 C) 97.8 F (36.6 C)  SpO2:  91%   EYES: anicteric ENMT: no thrush Respiratory: on oxygen by nasal cannula; normal respiratory effort GI: soft, nt.  Bruising at site of injection Musculoskeletal: no edema, no leg or knee swelling Skin: no rash Hematologic: no cervical lad  Lab Results  Component Value Date   WBC 4.1 10/17/2021   HGB 9.5 (L) 10/17/2021   HCT 31.6 (L) 10/17/2021   MCV 91.3 10/17/2021   PLT 278 10/17/2021    Lab Results  Component Value Date   CREATININE 1.73 (H) 10/17/2021   BUN 33 (H) 10/17/2021   NA 138 10/17/2021   K 4.1 10/17/2021   CL 110 10/17/2021   CO2 22 10/17/2021    Lab Results  Component Value Date   ALT 13 10/17/2021   AST 18 10/17/2021   ALKPHOS 54 10/17/2021     Microbiology: Recent Results (from the past 240 hour(s))  Blood Culture (routine x 2)     Status: Abnormal   Collection Time: 10/13/21 11:40 PM   Specimen: BLOOD  Result Value Ref Range Status   Specimen Description   Final    BLOOD BLOOD RIGHT WRIST Performed at [facility], [address], [place], [place] [address]    Special Requests   Final    BOTTLES DRAWN AEROBIC AND ANAEROBIC Blood Culture adequate volume Performed at [facility] [address], [place], [place] [address]    Culture  Setup Time   Final    GRAM POSITIVE COCCI ANAEROBIC BOTTLE ONLY CRITICAL RESULT CALLED TO, READ BACK BY AND VERIFIED WITH: C/ PHARMD MARY S. 10/14/21 2058 A. [place] Performed at [facility], [place] [address], [place], [place] [address]    Culture GROUP B STREP(S.AGALACTIAE)ISOLATED (A)  Final   Report Status 10/16/2021 FINAL  Final   Organism ID, Bacteria GROUP B STREP(S.AGALACTIAE)ISOLATED  Final      Susceptibility   Group b strep(s.agalactiae)isolated - MIC*    CLINDAMYCIN <=0.25 SENSITIVE Sensitive     AMPICILLIN <=0.25 SENSITIVE Sensitive     ERYTHROMYCIN <=0.12 SENSITIVE Sensitive     VANCOMYCIN 0.5 SENSITIVE  Sensitive     CEFTRIAXONE <=0.12 SENSITIVE Sensitive     LEVOFLOXACIN 0.5 SENSITIVE Sensitive     * GROUP B STREP(S.AGALACTIAE)ISOLATED  Blood Culture ID Panel (Reflexed)     Status: Abnormal   Collection Time: 10/13/21 11:40 PM  Result Value Ref Range Status   Enterococcus  faecalis NOT DETECTED NOT DETECTED Final   Enterococcus Faecium NOT DETECTED NOT DETECTED Final   Listeria monocytogenes NOT DETECTED NOT DETECTED Final   Staphylococcus species NOT DETECTED NOT DETECTED Final   Staphylococcus aureus (BCID) NOT DETECTED NOT DETECTED Final   Staphylococcus epidermidis NOT DETECTED NOT DETECTED Final   Staphylococcus lugdunensis NOT DETECTED NOT DETECTED Final   Streptococcus species DETECTED (A) NOT DETECTED Final    Comment: CRITICAL RESULT CALLED TO, READ BACK BY AND VERIFIED WITH: C/ PHARMD MARY S. 10/14/21 2058 A. LAFRANCE    Streptococcus agalactiae DETECTED (A) NOT DETECTED Final    Comment: CRITICAL RESULT CALLED TO, READ BACK BY AND VERIFIED WITH: C/ PHARMD MARY S. 10/14/21 2058 A. LAFRANCE    Streptococcus pneumoniae NOT DETECTED NOT DETECTED Final   Streptococcus pyogenes NOT DETECTED NOT DETECTED Final   A.calcoaceticus-baumannii NOT DETECTED NOT DETECTED Final   Bacteroides fragilis NOT DETECTED NOT DETECTED Final   Enterobacterales NOT DETECTED NOT DETECTED Final   Enterobacter cloacae complex NOT DETECTED NOT DETECTED Final   Escherichia coli NOT DETECTED NOT DETECTED Final   Klebsiella aerogenes NOT DETECTED NOT DETECTED Final   Klebsiella oxytoca NOT DETECTED NOT DETECTED Final   Klebsiella pneumoniae NOT DETECTED NOT DETECTED Final   Proteus species NOT DETECTED NOT DETECTED Final   Salmonella species NOT DETECTED NOT DETECTED Final   Serratia marcescens NOT DETECTED NOT DETECTED Final   Haemophilus influenzae NOT DETECTED NOT DETECTED Final   Neisseria meningitidis NOT DETECTED NOT DETECTED Final   Pseudomonas aeruginosa NOT DETECTED NOT DETECTED Final    Stenotrophomonas maltophilia NOT DETECTED NOT DETECTED Final   Candida albicans NOT DETECTED NOT DETECTED Final   Candida auris NOT DETECTED NOT DETECTED Final   Candida glabrata NOT DETECTED NOT DETECTED Final   Candida krusei NOT DETECTED NOT DETECTED Final   Candida parapsilosis NOT DETECTED NOT DETECTED Final   Candida tropicalis NOT DETECTED NOT DETECTED Final   Cryptococcus neoformans/gattii NOT DETECTED NOT DETECTED Final    Comment: Performed at [facility], [address] [place], [place] [address]  Blood Culture (routine x 2)     Status: None (Preliminary result)   Collection Time: 10/14/21 12:40 AM   Specimen: BLOOD  Result Value Ref Range Status   Specimen Description   Final    BLOOD BLOOD LEFT HAND Performed at [facility] [address] [place], [place] [address]    Special Requests   Final    BOTTLES DRAWN AEROBIC AND ANAEROBIC Blood Culture adequate volume Performed at [facility] [address] [place], [place] [address]    Culture   Final    NO GROWTH 3 DAYS Performed at [facility], [place] [address], [place], [place] [address]    Report Status PENDING  Incomplete  Resp Panel by RT-PCR (Flu A&B, Covid) Anterior Nasal Swab     Status: None   Collection Time: 10/14/21 12:50 AM   Specimen: Anterior Nasal Swab  Result Value Ref Range Status   SARS Coronavirus 2 by RT PCR NEGATIVE NEGATIVE Final    Comment: (NOTE) SARS-CoV-2 target nucleic acids are NOT DETECTED.  The SARS-CoV-2 RNA is generally detectable in upper respiratory specimens during the acute phase of infection. The lowest concentration of SARS-CoV-2 viral copies this assay can detect is 138 copies/mL. A negative result does not preclude SARS-Cov-2 infection and should not be used as the sole basis for treatment or other patient management decisions. A negative  result may occur with  improper specimen collection/handling, submission of specimen  other than nasopharyngeal swab, presence of viral mutation(s) within the areas targeted by this assay, and inadequate number of viral copies(<138 copies/mL). A negative result must be combined with clinical observations, patient history, and epidemiological information. The expected result is Negative.  Fact Sheet for Patients:  EntrepreneurPulse.com.au  Fact Sheet for Healthcare Providers:  IncredibleEmployment.be  This test is no t yet approved or cleared by the [place] FDA and  has been authorized for detection and/or diagnosis of SARS-CoV-2 by FDA under an Emergency Use Authorization (EUA). This EUA will remain  in effect (meaning this test can be used) for the duration of the COVID-19 declaration under Section 564(b)(1) of the Act, 21 U.S.C.section 360bbb-3(b)(1), unless the authorization is terminated  or revoked sooner.       Influenza A by PCR NEGATIVE NEGATIVE Final   Influenza B by PCR NEGATIVE NEGATIVE Final    Comment: (NOTE) The Xpert Xpress SARS-CoV-2/FLU/RSV plus assay is intended as an aid in the diagnosis of influenza from Nasopharyngeal swab specimens and should not be used as a sole basis for treatment. Nasal washings and aspirates are unacceptable for Xpert Xpress SARS-CoV-2/FLU/RSV testing.  Fact Sheet for Patients: EntrepreneurPulse.com.au  Fact Sheet for Healthcare Providers: IncredibleEmployment.be  This test is not yet approved or cleared by the [place] FDA and has been authorized for detection and/or diagnosis of SARS-CoV-2 by FDA under an Emergency Use Authorization (EUA). This EUA will remain in effect (meaning this test can be used) for the duration of the COVID-19 declaration under Section 564(b)(1) of the Act, 21 U.S.C. section 360bbb-3(b)(1), unless the authorization is terminated or revoked.  Performed at [facility], [place] [address] [place], [place] [address]   MRSA Next Gen by PCR, Nasal     Status: Abnormal   Collection Time: 10/14/21  3:42 AM   Specimen: Nasal Mucosa; Nasal Swab  Result Value Ref Range Status   MRSA by PCR Next Gen DETECTED (A) NOT DETECTED Final    Comment: (NOTE) The GeneXpert MRSA Assay (FDA approved for NASAL specimens only), is one component of a comprehensive MRSA colonization surveillance program. It is not intended to diagnose MRSA infection nor to guide or monitor treatment for MRSA infections. Test performance is not FDA approved in patients less than 51 years old. Performed at [facility], [place] [address], [place], [place] [address]   Urine Culture     Status: None   Collection Time: 10/14/21  9:38 AM   Specimen: In/Out Cath Urine  Result Value Ref Range Status   Specimen Description   Final    IN/OUT CATH URINE Performed at [facility] [address], [place], [place] [address]    Special Requests   Final    NONE Performed at [facility], [place] [address], [place], [place] [address]    Culture   Final    NO GROWTH Performed at [facility], [place] [address] [place], [place] [address]    Report Status 10/15/2021 FINAL  Final  Culture, blood (Routine X 2) w Reflex to ID Panel     Status: None (Preliminary result)   Collection Time: 10/15/21  9:17 AM   Specimen: BLOOD  Result Value Ref Range Status   Specimen Description   Final    BLOOD BLOOD LEFT ARM Performed at [facility] [address] [place],  [address]  Special Requests   Final    BOTTLES DRAWN AEROBIC ONLY Blood Culture adequate volume Performed at [facility] [address] [place], [place] [address]    Culture   Final    NO GROWTH 2 DAYS Performed at [facility] [address], [place], [place] [address]    Report Status PENDING  Incomplete  Culture, blood (Routine X 2) w Reflex to  ID Panel     Status: None (Preliminary result)   Collection Time: 10/15/21  9:23 AM   Specimen: BLOOD  Result Value Ref Range Status   Specimen Description   Final    BLOOD BLOOD RIGHT HAND Performed at [facility] [address], [place], [place] [address]    Special Requests   Final    BOTTLES DRAWN AEROBIC AND ANAEROBIC Blood Culture adequate volume Performed at [facility] [address] [place],  [address]    Culture   Final    NO GROWTH 2 DAYS Performed at [facility] [address] [place],  [address]    Report Status PENDING  Incomplete    Thayer Headings, [facility] for Infectious Disease [place] [email or web address] 10/17/2021, 1:57 PM

## 2021-10-17 NOTE — Progress Notes (Signed)
Physical Therapy Treatment Patient Details Name: [name] [name] MRN: [id number] DOB: 10/17/1932 Today's Date: 10/17/2021   History of Present Illness Patient is a 88 year old female who presented to the hosptial with hypoxia, shakiness, chills, and fever. patient was admitted with acute hypoxic respiratory failure secondary to pulmonary edema, septic shock,  PMH: hypertension, diabetes mellitus type 2, CKD stage IV, hypothyroidism, seizure disorder, hyperlipidemia, iron deficiency anemia, GERD, vitamin D deficiency, myeloproliferative neoplasm.    PT Comments    Pt just up with [facility] to [facility] and sitting in recliner on arrival.  Pt provided with rollator and assisted with ambulating in hallway. Pt required supplemental oxygen.  Continue to recommend SNF at this time.  SATURATION QUALIFICATIONS: (This note is used to comply with regulatory documentation for home oxygen)  Patient Saturations on Room Air at Rest = 93%  Patient Saturations on Room Air while Ambulating = 87%  Patient Saturations on 2 Liters of oxygen while Ambulating = 92%  Please briefly explain why patient needs home oxygen: to improve oxygen saturations with physical activities such as ambulation.    Recommendations for follow up therapy are one component of a multi-disciplinary discharge planning process, led by the attending physician.  Recommendations may be updated based on patient status, additional functional criteria and insurance authorization.  Follow Up Recommendations  Skilled nursing-short term rehab (<3 hours/day) Can patient physically be transported by private vehicle: Yes   Assistance Recommended at Discharge Intermittent Supervision/Assistance  Patient can return home with the following A little help with walking and/or transfers;A little help with bathing/dressing/bathroom;Help with stairs or ramp for entrance;Assist for transportation;Assistance with cooking/housework   Equipment Recommendations  None  recommended by PT    Recommendations for Other Services       Precautions / Restrictions Precautions Precaution Comments: monitor O2     Mobility  Bed Mobility Overal bed mobility: Modified Independent                  Transfers Overall transfer level: Needs assistance Equipment used: Rollator (4 wheels) Transfers: Sit to/from Stand Sit to Stand: Min guard           General transfer comment: pt aware to use brakes with rollator, min/guard for safety, multiple lines    Ambulation/Gait Ambulation/Gait assistance: Min guard Gait Distance (Feet): 80 Feet Assistive device: Rollator (4 wheels) Gait Pattern/deviations: Decreased stride length, Step-through pattern Gait velocity: decr     General Gait Details: slow but steady, distance to tolerance, SPO2 dropped to 87% on room air so reapplied 2L O2 [place] and SPO2 92%   Stairs             Wheelchair Mobility    Modified Rankin (Stroke Patients Only)       Balance                                            Cognition Arousal/Alertness: Awake/alert Behavior During Therapy: WFL for tasks assessed/performed Overall Cognitive Status: Within Functional Limits for tasks assessed                                          Exercises      General Comments        Pertinent Vitals/Pain Pain Assessment Pain Assessment: No/denies  pain    Home Living                          Prior Function            PT Goals (current goals can now be found in the care plan section) Progress towards PT goals: Progressing toward goals    Frequency    Min 3X/week      PT Plan Current plan remains appropriate    Co-evaluation              AM-PAC PT "6 Clicks" Mobility   Outcome Measure  Help needed turning from your back to your side while in a flat bed without using bedrails?: None Help needed moving from lying on your back to sitting on the side of a flat  bed without using bedrails?: A Little Help needed moving to and from a bed to a chair (including a wheelchair)?: A Little Help needed standing up from a chair using your arms (e.g., wheelchair or bedside chair)?: A Little Help needed to walk in hospital room?: A Little Help needed climbing 3-5 steps with a railing? : A Lot 6 Click Score: 18    End of Session Equipment Utilized During Treatment: Oxygen Activity Tolerance: Patient tolerated treatment well Patient left: in bed;with call bell/phone within reach;with bed alarm set Nurse Communication: Mobility status PT Visit Diagnosis: Unsteadiness on feet (R26.81);Difficulty in walking, not elsewhere classified (R26.2)     Time: 4982-6415 PT Time Calculation (min) (ACUTE ONLY): 17 min  Charges:  $Gait Training: 8-22 mins                     Jannette Spanner PT, DPT Physical Therapist Acute Rehabilitation Services Preferred contact method: Secure Chat Weekend Pager Only: [phone] Office: [place] 10/17/2021, 3:32 PM

## 2021-10-17 NOTE — Progress Notes (Signed)
PROGRESS NOTE  [name] [name] FBP:[id number] DOB: 1932-09-21 DOA: 10/13/2021 PCP: Unk Pinto, MD   LOS: 3 days   Brief Narrative / Interim history: 88 year old female with history of JAK2 positive MPN, HTN, DM 2, CKD 4, hypothyroidism, seizure disorder, HLD, comes to the hospital from her ILF due to chills.  She was in her usual state of health prior to this, and was found to be febrile to 102 and was brought to the hospital.  She was found to be hypoxic requiring 4 L in the ED.  She was hypotensive, required vasopressors and was admitted to the ICU.  Pertinent data: Chest x-ray 8/26-cardiomegaly, vascular congestion Left foot x-ray 8/27-no acute osseous abnormalities Chest x-ray 8/29 x 2 -worsening edema, cardiomegaly, pulmonary vascular congestion Blood cultures 8/26-pansensitive group B strep (agalactiae) Blood cultures 8/27, 8/28-no growth, pending 2D echo 8/27-EF 60-65%, no WMA, grade 2 diastolic dysfunction Lower extremity Dopplers 8/27-no DVT, no popliteal cysts  Subjective / 24h Interval events: She is feeling okay this morning, no fever or chills.  No abdominal pain, no nausea or vomiting.  Assesement and Plan: Principal problem Septic shock due to Streptococcus bacteremia-initially admitted to the ICU and required vasopressors.  Now she is off vasopressors and blood pressure improved.  Continue antibiotics with penicillin, consult ID today  Active problems Acute hypoxic respiratory failure due to acute pulmonary edema due to acute on chronic diastolic CHF-she was initially on pressors, but once blood pressure normalized received Lasix x1 on 8/29.  Blood pressure looks good this morning, continues to have evidence of fluid overload, schedule Lasix.  2D echo as above with normal EF and grade 2 diastolic dysfunction.  CKD stage IV-baseline creatinine 1.8-2.1.  Currently at baseline.  Monitor while on Lasix.  JAK2 positive myeloproliferative neoplasm-follows with Dr. Irene Limbo  as an outpatient, currently being observed without specific treatment.  Counts are stable currently  HTN-blood pressure stable, placed on Lasix today  Anemia of chronic kidney disease-hemoglobin stable  Foot wound-x-ray looks well, no bony involvement.  No apparent infection  Seizure disorder-continue home AEDs  Hyperkalemia-received Lokelma x1.  Hypothyroidism-continue Synthroid  ?  Obesity-BMI borderline at 33, but she is fluid overloaded.  Monitor daily weights  DM2-A1c 5.6.  Continue sliding scale  CBG (last 3)  Recent Labs    10/16/21 1527 10/16/21 2109 10/17/21 0800  GLUCAP 183* 169* 163*   Constipation/hemorrhoids -Ordered senna docusate 1 tab p.o. twice daily as well as Anusol rectal cream   Scheduled Meds:  allopurinol  100 mg Oral BID   ascorbic acid  500 mg Oral Daily   aspirin EC  81 mg Oral Daily   Chlorhexidine Gluconate Cloth  6 each Topical Q0600   vitamin B-12  500 mcg Oral Daily   diclofenac Sodium  2 g Topical QID   heparin  5,000 Units Subcutaneous Q8H   hydrocortisone   Rectal BID   insulin aspart  0-5 Units Subcutaneous QHS   insulin aspart  0-9 Units Subcutaneous TID WC   levETIRAcetam  500 mg Oral BID   levothyroxine  100 mcg Oral QAC breakfast   loratadine  10 mg Oral Daily   mupirocin ointment  1 Application Nasal BID   omega-3 acid ethyl esters  1 g Oral QHS   pantoprazole  20 mg Oral Daily   Ensure Max Protein  11 oz Oral Daily   senna-docusate  1 tablet Oral BID   Continuous Infusions:  sodium chloride Stopped (10/15/21 1932)   penicillin G potassium  6 Million Units in dextrose 5 % 500 mL continuous infusion 41.7 mL/hr at 10/17/21 0500   PRN Meds:.acetaminophen, melatonin, mouth rinse, prochlorperazine, rOPINIRole  Diet Orders (From admission, onward)     Start     Ordered   10/14/21 0538  Diet heart healthy/carb modified Room service appropriate? Yes; Fluid consistency: Thin  Diet effective now       Question Answer Comment   Diet-HS Snack? Nothing   Room service appropriate? Yes   Fluid consistency: Thin      10/14/21 0537            DVT prophylaxis: heparin injection 5,000 Units Start: 10/14/21 0815   Lab Results  Component Value Date   PLT 278 10/17/2021      Code Status: DNR  Family Communication: No family at bedside  Status is: Inpatient Remains inpatient appropriate because: IV antibiotics   Level of care: Stepdown  Consultants:  ID  Objective: Vitals:   10/17/21 0400 10/17/21 0500 10/17/21 0800 10/17/21 0804  BP: (!) 118/36 (!) 131/48 (!) 143/46   Pulse: 73 93 96   Resp: (!) 23 (!) 23 (!) 26   Temp:   (!) 97.2 F (36.2 C) (!) 97.2 F (36.2 C)  TempSrc:   Oral Oral  SpO2: 90% 94% 90%   Weight:  83.2 kg    Height:        Intake/Output Summary (Last 24 hours) at 10/17/2021 1031 Last data filed at 10/17/2021 0805 Gross per 24 hour  Intake 1030.55 ml  Output 2200 ml  Net -1169.45 ml   Wt Readings from Last 3 Encounters:  10/17/21 83.2 kg  07/04/21 82.9 kg  05/14/21 86.5 kg    Examination:  Constitutional: NAD Eyes: no scleral icterus ENMT: Mucous membranes are moist.  Neck: normal, supple Respiratory: Faint basilar crackles, no wheezing Cardiovascular: Regular rate and rhythm, 3/6 SEM, 1+ edema Abdomen: non distended, no tenderness. Bowel sounds positive.  Musculoskeletal: no clubbing / cyanosis.  Skin: no rashes Neurologic: non focal   Data Reviewed: I have independently reviewed following labs and imaging studies   CBC Recent Labs  Lab 10/13/21 2352 10/14/21 0853 10/15/21 0311 10/16/21 0317 10/17/21 0313  WBC 12.5* 16.5* 9.5 5.6 4.1  HGB 10.9* 10.7* 10.0* 9.4* 9.5*  HCT 36.6 36.0 34.2* 31.8* 31.6*  PLT 351 372 305 254 278  MCV 89.7 90.0 92.9 91.9 91.3  MCH 26.7 26.8 27.2 27.2 27.5  MCHC 29.8* 29.7* 29.2* 29.6* 30.1  RDW 20.1* 20.1* 19.9* 19.6* 19.6*  LYMPHSABS 0.3* 0.6* 1.0 0.5* 0.6*  MONOABS 0.6 0.9 0.5 0.3 0.2  EOSABS 0.4 0.4 0.6* 0.4  0.4  BASOSABS 0.0 0.1 0.1 0.0 0.0    Recent Labs  Lab 10/13/21 2352 10/13/21 2355 10/14/21 0144 10/14/21 0853 10/15/21 0311 10/16/21 0317 10/17/21 0313  NA 139  --   --  141  141 131*  133* 137 138  K 4.3  --   --  4.3  4.4 5.0  5.3* 4.1 4.1  CL 108  --   --  112*  112* 104  106 110 110  CO2 21*  --   --  22  22 20*  20* 20* 22  GLUCOSE 278*  --   --  192*  190* 387*  408* 174* 172*  BUN 39*  --   --  42*  41* 45*  44* 38* 33*  CREATININE 2.12*  --   --  2.27*  2.17* 2.05*  2.04* 1.91*  1.73*  CALCIUM 9.2  --   --  9.1  9.1 8.4*  8.6* 9.4 9.5  AST 27  --   --  22 16 13* 18  ALT 17  --   --  '16 15 13 13  '$ ALKPHOS 65  --   --  56 49 49 54  BILITOT 0.9  --   --  0.9 0.9 0.8 0.8  ALBUMIN 3.3*  --   --  2.9*  2.9* 2.7*  2.7* 2.6* 2.7*  MG  --   --   --  1.9 2.0 2.0 2.1  DDIMER  --   --   --  0.29  --   --   --   LATICACIDVEN 2.5*  --  1.6  --   --   --   --   INR 1.3*  --   --   --   --   --   --   HGBA1C  --   --   --  5.6  --   --   --   BNP  --  494.1*  --   --   --   --   --     ------------------------------------------------------------------------------------------------------------------ No results for input(s): "CHOL", "HDL", "LDLCALC", "TRIG", "CHOLHDL", "LDLDIRECT" in the last 72 hours.  Lab Results  Component Value Date   HGBA1C 5.6 10/14/2021   ------------------------------------------------------------------------------------------------------------------ No results for input(s): "TSH", "T4TOTAL", "T3FREE", "THYROIDAB" in the last 72 hours.  Invalid input(s): "FREET3"  Cardiac Enzymes No results for input(s): "CKMB", "TROPONINI", "MYOGLOBIN" in the last 168 hours.  Invalid input(s): "CK" ------------------------------------------------------------------------------------------------------------------    Component Value Date/Time   BNP 494.1 (H) 10/13/2021 2355    CBG: Recent Labs  Lab 10/16/21 0716 10/16/21 1205 10/16/21 1527  10/16/21 2109 10/17/21 0800  GLUCAP 162* 188* 183* 169* 163*    Recent Results (from the past 240 hour(s))  Blood Culture (routine x 2)     Status: Abnormal   Collection Time: 10/13/21 11:40 PM   Specimen: BLOOD  Result Value Ref Range Status   Specimen Description   Final    BLOOD BLOOD RIGHT WRIST Performed at [facility] [address], [place], [place] [address]    Special Requests   Final    BOTTLES DRAWN AEROBIC AND ANAEROBIC Blood Culture adequate volume Performed at [facility] [address], [place], [place] [address]    Culture  Setup Time   Final    GRAM POSITIVE COCCI ANAEROBIC BOTTLE ONLY CRITICAL RESULT CALLED TO, READ BACK BY AND VERIFIED WITH: C/ PHARMD MARY S. 10/14/21 2058 A. LAFRANCE Performed at [facility], [place] [address] [place],  [address]    Culture GROUP B STREP(S.AGALACTIAE)ISOLATED (A)  Final   Report Status 10/16/2021 FINAL  Final   Organism ID, Bacteria GROUP B STREP(S.AGALACTIAE)ISOLATED  Final      Susceptibility   Group b strep(s.agalactiae)isolated - MIC*    CLINDAMYCIN <=0.25 SENSITIVE Sensitive     AMPICILLIN <=0.25 SENSITIVE Sensitive     ERYTHROMYCIN <=0.12 SENSITIVE Sensitive     VANCOMYCIN 0.5 SENSITIVE Sensitive     CEFTRIAXONE <=0.12 SENSITIVE Sensitive     LEVOFLOXACIN 0.5 SENSITIVE Sensitive     * GROUP B STREP(S.AGALACTIAE)ISOLATED  Blood Culture ID Panel (Reflexed)     Status: Abnormal   Collection Time: 10/13/21 11:40 PM  Result Value Ref Range Status   Enterococcus faecalis NOT DETECTED NOT DETECTED Final   Enterococcus Faecium NOT DETECTED  NOT DETECTED Final   Listeria monocytogenes NOT DETECTED NOT DETECTED Final   Staphylococcus species NOT DETECTED NOT DETECTED Final   Staphylococcus aureus (BCID) NOT DETECTED NOT DETECTED Final   Staphylococcus epidermidis NOT DETECTED NOT DETECTED Final   Staphylococcus lugdunensis NOT DETECTED NOT DETECTED Final    Streptococcus species DETECTED (A) NOT DETECTED Final    Comment: CRITICAL RESULT CALLED TO, READ BACK BY AND VERIFIED WITH: C/ PHARMD MARY S. 10/14/21 2058 A. LAFRANCE    Streptococcus agalactiae DETECTED (A) NOT DETECTED Final    Comment: CRITICAL RESULT CALLED TO, READ BACK BY AND VERIFIED WITH: C/ PHARMD MARY S. 10/14/21 2058 A. LAFRANCE    Streptococcus pneumoniae NOT DETECTED NOT DETECTED Final   Streptococcus pyogenes NOT DETECTED NOT DETECTED Final   A.calcoaceticus-baumannii NOT DETECTED NOT DETECTED Final   Bacteroides fragilis NOT DETECTED NOT DETECTED Final   Enterobacterales NOT DETECTED NOT DETECTED Final   Enterobacter cloacae complex NOT DETECTED NOT DETECTED Final   Escherichia coli NOT DETECTED NOT DETECTED Final   Klebsiella aerogenes NOT DETECTED NOT DETECTED Final   Klebsiella oxytoca NOT DETECTED NOT DETECTED Final   Klebsiella pneumoniae NOT DETECTED NOT DETECTED Final   Proteus species NOT DETECTED NOT DETECTED Final   Salmonella species NOT DETECTED NOT DETECTED Final   Serratia marcescens NOT DETECTED NOT DETECTED Final   Haemophilus influenzae NOT DETECTED NOT DETECTED Final   Neisseria meningitidis NOT DETECTED NOT DETECTED Final   Pseudomonas aeruginosa NOT DETECTED NOT DETECTED Final   Stenotrophomonas maltophilia NOT DETECTED NOT DETECTED Final   Candida albicans NOT DETECTED NOT DETECTED Final   Candida auris NOT DETECTED NOT DETECTED Final   Candida glabrata NOT DETECTED NOT DETECTED Final   Candida krusei NOT DETECTED NOT DETECTED Final   Candida parapsilosis NOT DETECTED NOT DETECTED Final   Candida tropicalis NOT DETECTED NOT DETECTED Final   Cryptococcus neoformans/gattii NOT DETECTED NOT DETECTED Final    Comment: Performed at [facility], [address], [place], [place] [address]  Blood Culture (routine x 2)     Status: None (Preliminary result)   Collection Time: 10/14/21 12:40 AM   Specimen: BLOOD  Result Value Ref Range Status    Specimen Description   Final    BLOOD BLOOD LEFT HAND Performed at [facility] [address] [place], [place] [address]    Special Requests   Final    BOTTLES DRAWN AEROBIC AND ANAEROBIC Blood Culture adequate volume Performed at [facility] [address] [place], [place] [address]    Culture   Final    NO GROWTH 3 DAYS Performed at [facility], [place] [address] [place],  [address]    Report Status PENDING  Incomplete  Resp Panel by RT-PCR (Flu A&B, Covid) Anterior Nasal Swab     Status: None   Collection Time: 10/14/21 12:50 AM   Specimen: Anterior Nasal Swab  Result Value Ref Range Status   SARS Coronavirus 2 by RT PCR NEGATIVE NEGATIVE Final    Comment: (NOTE) SARS-CoV-2 target nucleic acids are NOT DETECTED.  The SARS-CoV-2 RNA is generally detectable in upper respiratory specimens during the acute phase of infection. The lowest concentration of SARS-CoV-2 viral copies this assay can detect is 138 copies/mL. A negative result does not preclude SARS-Cov-2 infection and should not be used as the sole basis for treatment or other patient management decisions. A negative result may occur with  improper specimen collection/handling, submission of specimen  other than nasopharyngeal swab, presence of viral mutation(s) within the areas targeted by this assay, and inadequate number of viral copies(<138 copies/mL). A negative result must be combined with clinical observations, patient history, and epidemiological information. The expected result is Negative.  Fact Sheet for Patients:  EntrepreneurPulse.com.au  Fact Sheet for Healthcare Providers:  IncredibleEmployment.be  This test is no t yet approved or cleared by the [place] FDA and  has been authorized for detection and/or diagnosis of SARS-CoV-2 by FDA under an Emergency Use Authorization (EUA). This EUA will remain  in effect  (meaning this test can be used) for the duration of the COVID-19 declaration under Section 564(b)(1) of the Act, 21 U.S.C.section 360bbb-3(b)(1), unless the authorization is terminated  or revoked sooner.       Influenza A by PCR NEGATIVE NEGATIVE Final   Influenza B by PCR NEGATIVE NEGATIVE Final    Comment: (NOTE) The Xpert Xpress SARS-CoV-2/FLU/RSV plus assay is intended as an aid in the diagnosis of influenza from Nasopharyngeal swab specimens and should not be used as a sole basis for treatment. Nasal washings and aspirates are unacceptable for Xpert Xpress SARS-CoV-2/FLU/RSV testing.  Fact Sheet for Patients: EntrepreneurPulse.com.au  Fact Sheet for Healthcare Providers: IncredibleEmployment.be  This test is not yet approved or cleared by the [place] FDA and has been authorized for detection and/or diagnosis of SARS-CoV-2 by FDA under an Emergency Use Authorization (EUA). This EUA will remain in effect (meaning this test can be used) for the duration of the COVID-19 declaration under Section 564(b)(1) of the Act, 21 U.S.C. section 360bbb-3(b)(1), unless the authorization is terminated or revoked.  Performed at [facility], [place] [address] [place], [place] [address]   MRSA Next Gen by PCR, Nasal     Status: Abnormal   Collection Time: 10/14/21  3:42 AM   Specimen: Nasal Mucosa; Nasal Swab  Result Value Ref Range Status   MRSA by PCR Next Gen DETECTED (A) NOT DETECTED Final    Comment: (NOTE) The GeneXpert MRSA Assay (FDA approved for NASAL specimens only), is one component of a comprehensive MRSA colonization surveillance program. It is not intended to diagnose MRSA infection nor to guide or monitor treatment for MRSA infections. Test performance is not FDA approved in patients less than 15 years old. Performed at [facility], [place] [address] [place], [place] [address]   Urine Culture      Status: None   Collection Time: 10/14/21  9:38 AM   Specimen: In/Out Cath Urine  Result Value Ref Range Status   Specimen Description   Final    IN/OUT CATH URINE Performed at [facility] [address] [place], [place] [address]    Special Requests   Final    NONE Performed at [facility], [place] [address] [place], [place] [address]    Culture   Final    NO GROWTH Performed at [facility], [place] [address] [place], [place] [address]    Report Status 10/15/2021 FINAL  Final  Culture, blood (Routine X 2) w Reflex to ID Panel     Status: None (Preliminary result)   Collection Time: 10/15/21  9:17 AM   Specimen: BLOOD  Result Value Ref Range Status   Specimen Description   Final    BLOOD BLOOD LEFT ARM Performed at [facility] [address], [place], [place] [address]    Special Requests   Final    BOTTLES  DRAWN AEROBIC ONLY Blood Culture adequate volume Performed at [facility] [address], [place], [place] [address]    Culture   Final    NO GROWTH 2 DAYS Performed at [facility] [address], [place], [place] [address]    Report Status PENDING  Incomplete  Culture, blood (Routine X 2) w Reflex to ID Panel     Status: None (Preliminary result)   Collection Time: 10/15/21  9:23 AM   Specimen: BLOOD  Result Value Ref Range Status   Specimen Description   Final    BLOOD BLOOD RIGHT HAND Performed at [facility] [address], [place], [place] [address]    Special Requests   Final    BOTTLES DRAWN AEROBIC AND ANAEROBIC Blood Culture adequate volume Performed at [facility] [address] [place], [place] [address]    Culture   Final    NO GROWTH 2 DAYS Performed at [facility] [address] [place], [place] [address]    Report Status PENDING  Incomplete     Radiology Studies: DG CHEST PORT 1 VIEW  Result Date:  10/16/2021 CLINICAL DATA:  Shortness of breath EXAM: PORTABLE CHEST 1 VIEW COMPARISON:  Chest x-ray 10/16/2021 FINDINGS: The heart is enlarged. There central pulmonary vascular congestion. There is no focal lung consolidation or pneumothorax. There is no pleural effusion. Osseous structures are within normal limits. IMPRESSION: Cardiomegaly with central pulmonary vascular congestion. Electronically Signed   By: Ronney Asters M.D.   On: 10/16/2021 18:25     Marzetta Board, MD, PhD [facility]  Between 7 am - 7 pm I am available, please contact me via Amion (for emergencies) or Securechat (non urgent messages)  Between 7 pm - 7 am I am not available, please contact night coverage MD/APP via Amion

## 2021-10-17 NOTE — Consult Note (Signed)
[facility] Nurse Consult Note: Reason for Consult:Consult received for "foot wound". Patient with remote history of right heel callus and right third toe wound which have resolved. Wound type:None Pressure Injury POA: N/A Measurement:N/A Wound bed:N/A Drainage (amount, consistency, odor) None Periwound: intact, dry Dressing procedure/placement/frequency:Discussed POC with Bedside RN, Christel Mormon.  Plan is to float heels. No dressings.  [facility] nursing team will not follow, but will remain available to this patient, the nursing and medical teams.  Please re-consult if needed.  Thank you for inviting [place] to participate in this patient's Plan of Care.  Maudie Flakes, MSN, RN, CNS, [place], Serita Grammes, [employer], [employer] phone:  [phone]

## 2021-10-18 DIAGNOSIS — B951 Streptococcus, group B, as the cause of diseases classified elsewhere: Secondary | ICD-10-CM

## 2021-10-18 DIAGNOSIS — R7881 Bacteremia: Secondary | ICD-10-CM | POA: Diagnosis not present

## 2021-10-18 DIAGNOSIS — J9601 Acute respiratory failure with hypoxia: Secondary | ICD-10-CM | POA: Diagnosis not present

## 2021-10-18 LAB — COMPREHENSIVE METABOLIC PANEL
ALT: 15 U/L (ref 0–44)
AST: 18 U/L (ref 15–41)
Albumin: 2.6 g/dL — ABNORMAL LOW (ref 3.5–5.0)
Alkaline Phosphatase: 52 U/L (ref 38–126)
Anion gap: 7 (ref 5–15)
BUN: 34 mg/dL — ABNORMAL HIGH (ref 8–23)
CO2: 22 mmol/L (ref 22–32)
Calcium: 9.9 mg/dL (ref 8.9–10.3)
Chloride: 110 mmol/L (ref 98–111)
Creatinine, Ser: 1.74 mg/dL — ABNORMAL HIGH (ref 0.44–1.00)
GFR, Estimated: 28 mL/min — ABNORMAL LOW (ref >60)
Glucose, Bld: 191 mg/dL — ABNORMAL HIGH (ref 70–99)
Potassium: 4.1 mmol/L (ref 3.5–5.1)
Sodium: 139 mmol/L (ref 135–145)
Total Bilirubin: 0.6 mg/dL (ref 0.3–1.2)
Total Protein: 5.5 g/dL — ABNORMAL LOW (ref 6.5–8.1)

## 2021-10-18 LAB — GLUCOSE, CAPILLARY
Glucose-Capillary: 162 mg/dL — ABNORMAL HIGH (ref 70–99)
Glucose-Capillary: 148 mg/dL — ABNORMAL HIGH (ref 70–99)
Glucose-Capillary: 144 mg/dL — ABNORMAL HIGH (ref 70–99)
Glucose-Capillary: 161 mg/dL — ABNORMAL HIGH (ref 70–99)

## 2021-10-18 LAB — CBC
HCT: 33.2 % — ABNORMAL LOW (ref 36.0–46.0)
Hemoglobin: 9.9 g/dL — ABNORMAL LOW (ref 12.0–15.0)
MCH: 26.5 pg (ref 26.0–34.0)
MCHC: 29.8 g/dL — ABNORMAL LOW (ref 30.0–36.0)
MCV: 89 fL (ref 80.0–100.0)
Platelets: 283 10*3/uL (ref 150–400)
RBC: 3.73 MIL/uL — ABNORMAL LOW (ref 3.87–5.11)
RDW: 19.5 % — ABNORMAL HIGH (ref 11.5–15.5)
WBC: 4.6 10*3/uL (ref 4.0–10.5)
nRBC: 0 % (ref 0.0–0.2)

## 2021-10-18 LAB — MAGNESIUM: Magnesium: 1.9 mg/dL (ref 1.7–2.4)

## 2021-10-18 LAB — CULTURE, BLOOD (ROUTINE X 2): Special Requests: ADEQUATE

## 2021-10-18 MED ORDER — FUROSEMIDE 10 MG/ML IJ SOLN
40.0000 mg | Freq: Once | INTRAMUSCULAR | Status: AC
  Administered 2021-10-18: 40 mg via INTRAVENOUS
  Filled 2021-10-18: qty 4

## 2021-10-18 MED ORDER — AMOXICILLIN 250 MG PO CAPS
1000.0000 mg | ORAL_CAPSULE | Freq: Two times a day (BID) | ORAL | Status: DC
  Administered 2021-10-18 – 2021-10-20 (×5): 1000 mg via ORAL
  Filled 2021-10-18 (×5): qty 4

## 2021-10-18 NOTE — TOC Progression Note (Signed)
Transition of Care [facility]) - Progression Note    Patient Details  Name: DEVINNE EPSTEIN MRN: [id number] Date of Birth: [birth year]  Transition of Care [facility]) CM/SW Contact  Leeroy Cha, RN Phone Number: 10/18/2021, 1:30 PM  Clinical Narrative:    Tct-Whitney Brown/ at [facility] message left that pt may be ready for transport to the snf tomorrow please return call.   Expected Discharge Plan: [place] Barriers to Discharge: Continued Medical Work up  Expected Discharge Plan and Services Expected Discharge Plan: [place] arrangements for the past 2 months: [place]                                       Social Determinants of Health (SDOH) Interventions    Readmission Risk Interventions   No data to display

## 2021-10-18 NOTE — Plan of Care (Signed)
  Problem: Education: Goal: Knowledge of General Education information will improve Description: Including pain rating scale, medication(s)/side effects and non-pharmacologic comfort measures Outcome: Progressing   Problem: Health Behavior/Discharge Planning: Goal: Ability to manage health-related needs will improve Outcome: Progressing   Problem: Clinical Measurements: Goal: Will remain free from infection Outcome: Progressing   Problem: Activity: Goal: Risk for activity intolerance will decrease Outcome: Progressing   Problem: Coping: Goal: Level of anxiety will decrease Outcome: Progressing   Problem: Pain Managment: Goal: General experience of comfort will improve Outcome: Progressing   Problem: Safety: Goal: Ability to remain free from injury will improve Outcome: Progressing   Problem: Health Behavior/Discharge Planning: Goal: Ability to manage health-related needs will improve Outcome: Progressing

## 2021-10-18 NOTE — Progress Notes (Signed)
PROGRESS NOTE  [name] [name] EHU:[id number] DOB: [birth year] DOA: 10/13/2021 PCP: Unk Pinto, MD   LOS: 4 days   Brief Narrative / Interim history: 88 year old female with history of JAK2 positive MPN, HTN, DM 2, CKD 4, hypothyroidism, seizure disorder, HLD, comes to the hospital from her ILF due to chills.  She was in her usual state of health prior to this, and was found to be febrile to 102 and was brought to the hospital.  She was found to be hypoxic requiring 4 L in the ED.  She was hypotensive, required vasopressors and was admitted to the ICU.  Pertinent data: Chest x-ray 8/26-cardiomegaly, vascular congestion Left foot x-ray 8/27-no acute osseous abnormalities Chest x-ray 8/29 x 2 -worsening edema, cardiomegaly, pulmonary vascular congestion Blood cultures 8/26-pansensitive group B strep (agalactiae) Blood cultures 8/27, 8/28-no growth, pending 2D echo 8/27-EF 60-65%, no WMA, grade 2 diastolic dysfunction Lower extremity Dopplers 8/27-no DVT, no popliteal cysts  Subjective / 24h Interval events: Denies any shortness of breath at rest, has not been walking much.  No chest pain.  Assesement and Plan: Principal problem Septic shock due to Streptococcus bacteremia-initially admitted to the ICU and required vasopressors.  Now she is off vasopressors and blood pressure improved.  Continue antibiotics with penicillin, ID consulted, appreciate input.  Active problems Acute hypoxic respiratory failure due to acute pulmonary edema due to acute on chronic diastolic CHF-she was initially on pressors, but once blood pressure normalized received Lasix x1 on 8/29, repeat 8/30. 2D echo as above with normal EF and grade 2 diastolic dysfunction. -Renal function stable, blood pressure stable, repeat Lasix today  CKD stage IV-baseline creatinine 1.8-2.1.  Currently at baseline.  Continue to monitor while on Lasix  JAK2 positive myeloproliferative neoplasm-follows with Dr. Irene Limbo as an  outpatient, currently being observed without specific treatment.  Counts are stable currently  HTN-blood pressure stable, placed on Lasix today  Anemia of chronic kidney disease-hemoglobin stable  Foot wound-x-ray looks well, no bony involvement.  No apparent infection  Seizure disorder-continue home AEDs  Hyperkalemia-received Lokelma x1.  Hypothyroidism-continue Synthroid  ?  Obesity-BMI borderline at 33, but she is fluid overloaded.  Monitor daily weights  DM2-A1c 5.6.  Continue sliding scale  CBG (last 3)  Recent Labs    10/17/21 1618 10/17/21 2048 10/18/21 0722  GLUCAP 135* 169* 162*    Constipation/hemorrhoids -Ordered senna docusate 1 tab p.o. twice daily as well as Anusol rectal cream   Scheduled Meds:  allopurinol  100 mg Oral BID   ascorbic acid  500 mg Oral Daily   aspirin EC  81 mg Oral Daily   Chlorhexidine Gluconate Cloth  6 each Topical Q0600   vitamin B-12  500 mcg Oral Daily   diclofenac Sodium  2 g Topical QID   heparin  5,000 Units Subcutaneous Q8H   hydrocortisone   Rectal BID   insulin aspart  0-5 Units Subcutaneous QHS   insulin aspart  0-9 Units Subcutaneous TID WC   levETIRAcetam  500 mg Oral BID   levothyroxine  100 mcg Oral QAC breakfast   loratadine  10 mg Oral Daily   mupirocin ointment  1 Application Nasal BID   omega-3 acid ethyl esters  1 g Oral QHS   pantoprazole  20 mg Oral Daily   Ensure Max Protein  11 oz Oral Daily   senna-docusate  1 tablet Oral BID   Continuous Infusions:  sodium chloride Stopped (10/15/21 1932)   penicillin G potassium 6 Million Units in  dextrose 5 % 500 mL continuous infusion 6 Million Units (10/18/21 0102)   PRN Meds:.acetaminophen, melatonin, mouth rinse, prochlorperazine, rOPINIRole  Diet Orders (From admission, onward)     Start     Ordered   10/14/21 0538  Diet heart healthy/carb modified Room service appropriate? Yes; Fluid consistency: Thin  Diet effective now       Question Answer Comment   Diet-HS Snack? Nothing   Room service appropriate? Yes   Fluid consistency: Thin      10/14/21 0537            DVT prophylaxis: heparin injection 5,000 Units Start: 10/14/21 0815   Lab Results  Component Value Date   PLT 283 10/18/2021      Code Status: DNR  Family Communication: No family at bedside  Status is: Inpatient Remains inpatient appropriate because: IV antibiotics   Level of care: Telemetry  Consultants:  ID  Objective: Vitals:   10/18/21 0539 10/18/21 0657 10/18/21 0824 10/18/21 0836  BP: (!) 135/56  (!) 133/58   Pulse: 96  (!) 112   Resp: '17  18 17  '$ Temp: 97.9 F (36.6 C)  97.7 F (36.5 C)   TempSrc: Oral  Oral   SpO2: 93%  96%   Weight:  81.4 kg    Height:        Intake/Output Summary (Last 24 hours) at 10/18/2021 1056 Last data filed at 10/18/2021 1008 Gross per 24 hour  Intake 1240.8 ml  Output 2850 ml  Net -1609.2 ml    Wt Readings from Last 3 Encounters:  10/18/21 81.4 kg  07/04/21 82.9 kg  05/14/21 86.5 kg    Examination:  Constitutional: NAD Eyes: lids and conjunctivae normal, no scleral icterus ENMT: mmm Neck: normal, supple Respiratory: clear to auscultation bilaterally, no wheezing, no crackles. Normal respiratory effort.  Cardiovascular: Regular rate and rhythm, no new murmurs / rubs / gallops. Trace LE edema. Abdomen: soft, no distention, no tenderness. Bowel sounds positive.  Skin: no rashes Neurologic: no focal deficits, equal strength   Data Reviewed: I have independently reviewed following labs and imaging studies   CBC Recent Labs  Lab 10/13/21 2352 10/14/21 0853 10/15/21 0311 10/16/21 0317 10/17/21 0313 10/18/21 0517  WBC 12.5* 16.5* 9.5 5.6 4.1 4.6  HGB 10.9* 10.7* 10.0* 9.4* 9.5* 9.9*  HCT 36.6 36.0 34.2* 31.8* 31.6* 33.2*  PLT 351 372 305 254 278 283  MCV 89.7 90.0 92.9 91.9 91.3 89.0  MCH 26.7 26.8 27.2 27.2 27.5 26.5  MCHC 29.8* 29.7* 29.2* 29.6* 30.1 29.8*  RDW 20.1* 20.1* 19.9* 19.6*  19.6* 19.5*  LYMPHSABS 0.3* 0.6* 1.0 0.5* 0.6*  --   MONOABS 0.6 0.9 0.5 0.3 0.2  --   EOSABS 0.4 0.4 0.6* 0.4 0.4  --   BASOSABS 0.0 0.1 0.1 0.0 0.0  --      Recent Labs  Lab 10/13/21 2352 10/13/21 2355 10/14/21 0144 10/14/21 0853 10/15/21 0311 10/16/21 0317 10/17/21 0313 10/18/21 0517  NA 139  --   --  141  141 131*  133* 137 138 139  K 4.3  --   --  4.3  4.4 5.0  5.3* 4.1 4.1 4.1  CL 108  --   --  112*  112* 104  106 110 110 110  CO2 21*  --   --  22  22 20*  20* 20* 22 22  GLUCOSE 278*  --   --  192*  190* 387*  408* 174* 172* 191*  BUN 39*  --   --  42*  41* 45*  44* 38* 33* 34*  CREATININE 2.12*  --   --  2.27*  2.17* 2.05*  2.04* 1.91* 1.73* 1.74*  CALCIUM 9.2  --   --  9.1  9.1 8.4*  8.6* 9.4 9.5 9.9  AST 27  --   --  22 16 13* 18 18  ALT 17  --   --  '16 15 13 13 15  '$ ALKPHOS 65  --   --  56 49 49 54 52  BILITOT 0.9  --   --  0.9 0.9 0.8 0.8 0.6  ALBUMIN 3.3*  --   --  2.9*  2.9* 2.7*  2.7* 2.6* 2.7* 2.6*  MG  --   --   --  1.9 2.0 2.0 2.1 1.9  DDIMER  --   --   --  0.29  --   --   --   --   LATICACIDVEN 2.5*  --  1.6  --   --   --   --   --   INR 1.3*  --   --   --   --   --   --   --   HGBA1C  --   --   --  5.6  --   --   --   --   BNP  --  494.1*  --   --   --   --   --   --      ------------------------------------------------------------------------------------------------------------------ No results for input(s): "CHOL", "HDL", "LDLCALC", "TRIG", "CHOLHDL", "LDLDIRECT" in the last 72 hours.  Lab Results  Component Value Date   HGBA1C 5.6 10/14/2021   ------------------------------------------------------------------------------------------------------------------ No results for input(s): "TSH", "T4TOTAL", "T3FREE", "THYROIDAB" in the last 72 hours.  Invalid input(s): "FREET3"  Cardiac Enzymes No results for input(s): "CKMB", "TROPONINI", "MYOGLOBIN" in the last 168 hours.  Invalid input(s):  "CK" ------------------------------------------------------------------------------------------------------------------    Component Value Date/Time   BNP 494.1 (H) 10/13/2021 2355    CBG: Recent Labs  Lab 10/17/21 0800 10/17/21 1154 10/17/21 1618 10/17/21 2048 10/18/21 0722  GLUCAP 163* 198* 135* 169* 162*     Recent Results (from the past 240 hour(s))  Blood Culture (routine x 2)     Status: Abnormal   Collection Time: 10/13/21 11:40 PM   Specimen: BLOOD  Result Value Ref Range Status   Specimen Description BLOOD BLOOD RIGHT WRIST  Final   Special Requests   Final    BOTTLES DRAWN AEROBIC AND ANAEROBIC Blood Culture adequate volume   Culture  Setup Time   Final    GRAM POSITIVE COCCI ANAEROBIC BOTTLE ONLY CRITICAL RESULT CALLED TO, READ BACK BY AND VERIFIED WITH: C/ PHARMD MARY S. 10/14/21 2058 A. LAFRANCE    Culture GROUP B STREP(S.AGALACTIAE)ISOLATED (A)  Final   Report Status 10/16/2021 FINAL  Final   Organism ID, Bacteria GROUP B STREP(S.AGALACTIAE)ISOLATED  Final      Susceptibility   Group b strep(s.agalactiae)isolated - MIC*    CLINDAMYCIN <=0.25 SENSITIVE Sensitive     AMPICILLIN <=0.25 SENSITIVE Sensitive     ERYTHROMYCIN <=0.12 SENSITIVE Sensitive     VANCOMYCIN 0.5 SENSITIVE Sensitive     CEFTRIAXONE <=0.12 SENSITIVE Sensitive     LEVOFLOXACIN 0.5 SENSITIVE Sensitive     PENICILLIN Value in next row Sensitive      SENSITIVE<=0.06Performed at [facility] [address], [place], [place] [address]    * GROUP B STREP(S.AGALACTIAE)ISOLATED  Blood Culture ID Panel (Reflexed)     Status: Abnormal   Collection Time: 10/13/21 11:40 PM  Result Value Ref Range Status   Enterococcus faecalis NOT DETECTED NOT DETECTED Final   Enterococcus Faecium NOT DETECTED NOT DETECTED Final   Listeria monocytogenes NOT DETECTED NOT DETECTED Final   Staphylococcus species NOT DETECTED NOT DETECTED Final   Staphylococcus aureus (BCID) NOT DETECTED NOT DETECTED Final    Staphylococcus epidermidis NOT DETECTED NOT DETECTED Final   Staphylococcus lugdunensis NOT DETECTED NOT DETECTED Final   Streptococcus species DETECTED (A) NOT DETECTED Final    Comment: CRITICAL RESULT CALLED TO, READ BACK BY AND VERIFIED WITH: C/ PHARMD MARY S. 10/14/21 2058 A. LAFRANCE    Streptococcus agalactiae DETECTED (A) NOT DETECTED Final    Comment: CRITICAL RESULT CALLED TO, READ BACK BY AND VERIFIED WITH: C/ PHARMD MARY S. 10/14/21 2058 A. LAFRANCE    Streptococcus pneumoniae NOT DETECTED NOT DETECTED Final   Streptococcus pyogenes NOT DETECTED NOT DETECTED Final   A.calcoaceticus-baumannii NOT DETECTED NOT DETECTED Final   Bacteroides fragilis NOT DETECTED NOT DETECTED Final   Enterobacterales NOT DETECTED NOT DETECTED Final   Enterobacter cloacae complex NOT DETECTED NOT DETECTED Final   Escherichia coli NOT DETECTED NOT DETECTED Final   Klebsiella aerogenes NOT DETECTED NOT DETECTED Final   Klebsiella oxytoca NOT DETECTED NOT DETECTED Final   Klebsiella pneumoniae NOT DETECTED NOT DETECTED Final   Proteus species NOT DETECTED NOT DETECTED Final   Salmonella species NOT DETECTED NOT DETECTED Final   Serratia marcescens NOT DETECTED NOT DETECTED Final   Haemophilus influenzae NOT DETECTED NOT DETECTED Final   Neisseria meningitidis NOT DETECTED NOT DETECTED Final   Pseudomonas aeruginosa NOT DETECTED NOT DETECTED Final   Stenotrophomonas maltophilia NOT DETECTED NOT DETECTED Final   Candida albicans NOT DETECTED NOT DETECTED Final   Candida auris NOT DETECTED NOT DETECTED Final   Candida glabrata NOT DETECTED NOT DETECTED Final   Candida krusei NOT DETECTED NOT DETECTED Final   Candida parapsilosis NOT DETECTED NOT DETECTED Final   Candida tropicalis NOT DETECTED NOT DETECTED Final   Cryptococcus neoformans/gattii NOT DETECTED NOT DETECTED Final    Comment: Performed at [facility], [address], [place], [place] [address]  Blood Culture (routine x 2)      Status: None (Preliminary result)   Collection Time: 10/14/21 12:40 AM   Specimen: BLOOD  Result Value Ref Range Status   Specimen Description   Final    BLOOD BLOOD LEFT HAND Performed at [facility] [address] [place], [place] [address]    Special Requests   Final    BOTTLES DRAWN AEROBIC AND ANAEROBIC Blood Culture adequate volume Performed at [facility] [address] [place], [place] [address]    Culture   Final    NO GROWTH 4 DAYS Performed at [facility], [place] [address], [place], [place] [address]    Report Status PENDING  Incomplete  Resp Panel by RT-PCR (Flu A&B, Covid) Anterior Nasal Swab     Status: None   Collection Time: 10/14/21 12:50 AM   Specimen: Anterior Nasal Swab  Result Value Ref Range Status   SARS Coronavirus 2 by RT PCR NEGATIVE NEGATIVE Final    Comment: (NOTE) SARS-CoV-2 target nucleic acids are NOT DETECTED.  The SARS-CoV-2 RNA is generally detectable in upper respiratory specimens during the acute phase of infection. The lowest concentration of SARS-CoV-2 viral copies this assay can detect is  138 copies/mL. A negative result does not preclude SARS-Cov-2 infection and should not be used as the sole basis for treatment or other patient management decisions. A negative result may occur with  improper specimen collection/handling, submission of specimen other than nasopharyngeal swab, presence of viral mutation(s) within the areas targeted by this assay, and inadequate number of viral copies(<138 copies/mL). A negative result must be combined with clinical observations, patient history, and epidemiological information. The expected result is Negative.  Fact Sheet for Patients:  EntrepreneurPulse.com.au  Fact Sheet for Healthcare Providers:  IncredibleEmployment.be  This test is no t yet approved or cleared by the [place] FDA and  has been authorized for  detection and/or diagnosis of SARS-CoV-2 by FDA under an Emergency Use Authorization (EUA). This EUA will remain  in effect (meaning this test can be used) for the duration of the COVID-19 declaration under Section 564(b)(1) of the Act, 21 U.S.C.section 360bbb-3(b)(1), unless the authorization is terminated  or revoked sooner.       Influenza A by PCR NEGATIVE NEGATIVE Final   Influenza B by PCR NEGATIVE NEGATIVE Final    Comment: (NOTE) The Xpert Xpress SARS-CoV-2/FLU/RSV plus assay is intended as an aid in the diagnosis of influenza from Nasopharyngeal swab specimens and should not be used as a sole basis for treatment. Nasal washings and aspirates are unacceptable for Xpert Xpress SARS-CoV-2/FLU/RSV testing.  Fact Sheet for Patients: EntrepreneurPulse.com.au  Fact Sheet for Healthcare Providers: IncredibleEmployment.be  This test is not yet approved or cleared by the [place] FDA and has been authorized for detection and/or diagnosis of SARS-CoV-2 by FDA under an Emergency Use Authorization (EUA). This EUA will remain in effect (meaning this test can be used) for the duration of the COVID-19 declaration under Section 564(b)(1) of the Act, 21 U.S.C. section 360bbb-3(b)(1), unless the authorization is terminated or revoked.  Performed at [facility], [place] [address], [place], [place] [address]   MRSA Next Gen by PCR, Nasal     Status: Abnormal   Collection Time: 10/14/21  3:42 AM   Specimen: Nasal Mucosa; Nasal Swab  Result Value Ref Range Status   MRSA by PCR Next Gen DETECTED (A) NOT DETECTED Final    Comment: (NOTE) The GeneXpert MRSA Assay (FDA approved for NASAL specimens only), is one component of a comprehensive MRSA colonization surveillance program. It is not intended to diagnose MRSA infection nor to guide or monitor treatment for MRSA infections. Test performance is not FDA approved in patients less  than 37 years old. Performed at [facility], [place] [address], [place], [place] [address]   Urine Culture     Status: None   Collection Time: 10/14/21  9:38 AM   Specimen: In/Out Cath Urine  Result Value Ref Range Status   Specimen Description   Final    IN/OUT CATH URINE Performed at [facility] [address] [place], [place] [address]    Special Requests   Final    NONE Performed at [facility], [place] [address] [place], [place] [address]    Culture   Final    NO GROWTH Performed at [facility], [place] [address] [place],  [address]    Report Status 10/15/2021 FINAL  Final  Culture, blood (Routine X 2) w Reflex to ID Panel     Status: None (Preliminary result)   Collection Time: 10/15/21  9:17 AM   Specimen: BLOOD  Result Value Ref Range Status  Specimen Description   Final    BLOOD BLOOD LEFT ARM Performed at [facility] [address], [place], [place] [address]    Special Requests   Final    BOTTLES DRAWN AEROBIC ONLY Blood Culture adequate volume Performed at [facility] [address] [place], [place] [address]    Culture   Final    NO GROWTH 3 DAYS Performed at [facility], [place] [address] [place], [place] [address]    Report Status PENDING  Incomplete  Culture, blood (Routine X 2) w Reflex to ID Panel     Status: None (Preliminary result)   Collection Time: 10/15/21  9:23 AM   Specimen: BLOOD  Result Value Ref Range Status   Specimen Description   Final    BLOOD BLOOD RIGHT HAND Performed at [facility] [address], [place], [place] [address]    Special Requests   Final    BOTTLES DRAWN AEROBIC AND ANAEROBIC Blood Culture adequate volume Performed at [facility] [address], [place], [place] [address]    Culture   Final    NO GROWTH 3 DAYS Performed at [facility], [place] [address], [place], [place] [address]    Report Status PENDING  Incomplete     Radiology Studies: No results found.   Marzetta Board, MD, PhD [facility]  Between 7 am - 7 pm I am available, please contact me via Amion (for emergencies) or Securechat (non urgent messages)  Between 7 pm - 7 am I am not available, please contact night coverage MD/APP via Amion

## 2021-10-18 NOTE — TOC Progression Note (Addendum)
Transition of Care [facility]) - Progression Note    Patient Details  Name: [name] [name] MRN: [id number] Date of Birth: 1932/08/04  [facility]) CM/SW Contact  Leeroy Cha, RN Phone Number: 10/18/2021, 1:42 PM  Clinical Narrative:    [facility] [place].  HAVE THE PAPERWORK should be able to come tomorrow.  Will call back with room number. Information for auth through [employer] faxed.  Could not enter in the [place] system.  Expected Discharge Plan: [place] Barriers to Discharge: Continued Medical Work up  Expected Discharge Plan and Services Expected Discharge Plan: [place] arrangements for the past 2 months: [place]                                       Social Determinants of Health (SDOH) Interventions    Readmission Risk Interventions   No data to display

## 2021-10-18 NOTE — Progress Notes (Signed)
Occupational Therapy Treatment Patient Details Name: [name] [name] MRN: [id number] DOB: 24-Apr-1932 Today's Date: 10/18/2021   History of present illness Patient is a 88 year old female who presented to the hosptial with hypoxia, shakiness, chills, and fever. patient was admitted with acute hypoxic respiratory failure secondary to pulmonary edema, septic shock,  PMH: hypertension, diabetes mellitus type 2, CKD stage IV, hypothyroidism, seizure disorder, hyperlipidemia, iron deficiency anemia, GERD, vitamin D deficiency, myeloproliferative neoplasm.   OT comments  Pt making good, steady progress toward all adl goals. Pt overall at supervision level for most adls. Pt was on RA during session today and O2 sats stayed between 89% and 91%. Nursing aware.  Feel pt is getting closer to baseline but does need to improve activity tolerance to return home.   Recommendations for follow up therapy are one component of a multi-disciplinary discharge planning process, led by the attending physician.  Recommendations may be updated based on patient status, additional functional criteria and insurance authorization.    Follow Up Recommendations  Skilled nursing-short term rehab (<3 hours/day)    Assistance Recommended at Discharge Frequent or constant Supervision/Assistance  Patient can return home with the following  A little help with walking and/or transfers;A little help with bathing/dressing/bathroom;Assistance with cooking/housework;Assist for transportation;Help with stairs or ramp for entrance   Equipment Recommendations  None recommended by OT    Recommendations for Other Services      Precautions / Restrictions Precautions Precautions: Other (comment) Precaution Comments: monitor O2 Restrictions Weight Bearing Restrictions: No       Mobility Bed Mobility               General bed mobility comments: Pt in chair on arrival    Transfers Overall transfer level: Needs  assistance Equipment used: Rolling walker (2 wheels) Transfers: Sit to/from Stand, Bed to chair/wheelchair/BSC Sit to Stand: Supervision Stand pivot transfers: Supervision         General transfer comment: Pt with good hand placement and safety awareness.     Balance Overall balance assessment: Mild deficits observed, not formally tested                                         ADL either performed or assessed with clinical judgement   ADL Overall ADL's : Needs assistance/impaired Eating/Feeding: Independent;Sitting   Grooming: Wash/dry hands;Wash/dry face;Oral care;Brushing hair;Supervision/safety;Standing Grooming Details (indicate cue type and reason): pt stood at sink for appx 8 min while grooming Upper Body Bathing: Set up;Sitting   Lower Body Bathing: Minimal assistance;Sit to/from stand;Cueing for compensatory techniques Lower Body Bathing Details (indicate cue type and reason): figure 4 technique used to reach lower legs and feet     Lower Body Dressing: Minimal assistance;Sit to/from stand;Cueing for compensatory techniques Lower Body Dressing Details (indicate cue type and reason): pt donned and doffed socks in chair using figure 4 technique with no assist. Toilet Transfer: Supervision/safety;Ambulation;Comfort height toilet;Grab bars;Rolling walker (2 wheels) Toilet Transfer Details (indicate cue type and reason): Pt walked to to bathroom with walker and compeleted all toileting tasks with supervision and use of walker Toileting- Clothing Manipulation and Hygiene: Supervision/safety;Sit to/from stand Toileting - Clothing Manipulation Details (indicate cue type and reason): with RW     Functional mobility during ADLs: Supervision/safety;Rolling walker (2 wheels) General ADL Comments: Pt completed appx 18 min of adl tasks without O2 and O2 sats at 89% at end  of session.  With cues for deep breathing, pt recovered quickly to 91%. Nursing notified that O2  is off for now.    Extremity/Trunk Assessment Upper Extremity Assessment Upper Extremity Assessment: Overall WFL for tasks assessed   Lower Extremity Assessment Lower Extremity Assessment: Defer to PT evaluation        Vision   Vision Assessment?: No apparent visual deficits   Perception Perception Perception: Within Functional Limits   Praxis Praxis Praxis: Intact    Cognition Arousal/Alertness: Awake/alert Behavior During Therapy: WFL for tasks assessed/performed Overall Cognitive Status: Within Functional Limits for tasks assessed                                          Exercises      Shoulder Instructions       General Comments Pt with improving adl independence in session.  Pt on RA and O2 sats 89%-91%.    Pertinent Vitals/ Pain       Pain Assessment Pain Assessment: No/denies pain  Home Living                                          Prior Functioning/Environment              Frequency  Min 2X/week        Progress Toward Goals  OT Goals(current goals can now be found in the care plan section)  Progress towards OT goals: Progressing toward goals  Acute Rehab OT Goals Patient Stated Goal: to go back home OT Goal Formulation: With patient Time For Goal Achievement: 10/30/21 Potential to Achieve Goals: Fair ADL Goals Pt Will Perform Grooming: with modified independence;standing Pt Will Perform Lower Body Dressing: with modified independence;with adaptive equipment;sit to/from stand Pt Will Transfer to Toilet: with modified independence;ambulating;regular height toilet Pt Will Perform Toileting - Clothing Manipulation and hygiene: with modified independence;sit to/from stand;sitting/lateral leans  Plan Discharge plan remains appropriate    Co-evaluation                 AM-PAC OT "6 Clicks" Daily Activity     Outcome Measure   Help from another person eating meals?: None Help from another  person taking care of personal grooming?: None Help from another person toileting, which includes using toliet, bedpan, or urinal?: A Little Help from another person bathing (including washing, rinsing, drying)?: A Little Help from another person to put on and taking off regular upper body clothing?: A Little Help from another person to put on and taking off regular lower body clothing?: A Little 6 Click Score: 20    End of Session Equipment Utilized During Treatment: Rolling walker (2 wheels)  OT Visit Diagnosis: Unsteadiness on feet (R26.81);Other abnormalities of gait and mobility (R26.89);History of falling (Z91.81)   Activity Tolerance Patient tolerated treatment well   Patient Left in chair;with call bell/phone within reach;with chair alarm set   Nurse Communication Mobility status        Time: 1210-1230 OT Time Calculation (min): 20 min  Charges: OT General Charges $OT Visit: 1 Visit OT Treatments $Self Care/Home Management : 8-22 mins   Glenford Peers 10/18/2021, 12:38 PM

## 2021-10-18 NOTE — Progress Notes (Signed)
[facility] for Infectious Disease  Date of Admission:  10/13/2021     Total days of antibiotics 6         ASSESSMENT:  Ms. Heiser blood cultures from 8/28 have remained without growth. Continues to have clinical improvement with no clear source of infection. Weakness is ongoing and has continued to work with physical therapy. Will change Pencillin to Amoxicillin with recommendation of 10 days of treatment from negative blood culture (8/28) with initial end date planned for 10/25/21. [place] for discharge from ID standpoint. Remaining medical and supportive care per primary team. Dr. Gale Journey to see.   PLAN:  Change penicillin to amoxicillin. Recommend total treatment of 10 days from negative blood culture (9/5).  Will continue to monitor cultures until finalized. [place] for discharge from ID standpoint.  Remaining medical and supportive care per primary team.   Principal Problem:   Bacteremia due to group B Streptococcus Active Problems:   Acute respiratory failure with hypoxia (HCC)    allopurinol  100 mg Oral BID   amoxicillin  1,000 mg Oral Q12H   ascorbic acid  500 mg Oral Daily   aspirin EC  81 mg Oral Daily   Chlorhexidine Gluconate Cloth  6 each Topical Q0600   vitamin B-12  500 mcg Oral Daily   diclofenac Sodium  2 g Topical QID   heparin  5,000 Units Subcutaneous Q8H   hydrocortisone   Rectal BID   insulin aspart  0-5 Units Subcutaneous QHS   insulin aspart  0-9 Units Subcutaneous TID WC   levETIRAcetam  500 mg Oral BID   levothyroxine  100 mcg Oral QAC breakfast   loratadine  10 mg Oral Daily   mupirocin ointment  1 Application Nasal BID   omega-3 acid ethyl esters  1 g Oral QHS   pantoprazole  20 mg Oral Daily   Ensure Max Protein  11 oz Oral Daily   senna-docusate  1 tablet Oral BID    SUBJECTIVE:  Afebrile overnight with no acute events. Feeling well today with some continued on-going weakness.   Allergies  Allergen Reactions   Ppd [Tuberculin Purified Protein  Derivative] Other (See Comments)    indurated     Review of Systems: Review of Systems  Constitutional:  Negative for chills, fever and weight loss.  Respiratory:  Negative for cough, shortness of breath and wheezing.   Cardiovascular:  Negative for chest pain and leg swelling.  Gastrointestinal:  Negative for abdominal pain, constipation, diarrhea, nausea and vomiting.  Skin:  Negative for rash.  Neurological:  Positive for weakness.      OBJECTIVE: Vitals:   10/18/21 0657 10/18/21 0824 10/18/21 0836 10/18/21 1121  BP:  (!) 133/58  (!) 142/56  Pulse:  (!) 112  (!) 102  Resp:  '18 17 15  '$ Temp:  97.7 F (36.5 C)  97.8 F (36.6 C)  TempSrc:  Oral  Oral  SpO2:  96%  96%  Weight: 81.4 kg     Height:       Body mass index is 32.82 kg/m.  Physical Exam Constitutional:      General: She is not in acute distress.    Appearance: She is well-developed.  Cardiovascular:     Rate and Rhythm: Normal rate and regular rhythm.     Heart sounds: Normal heart sounds.  Pulmonary:     Effort: Pulmonary effort is normal.     Breath sounds: Normal breath sounds.  Skin:    General:  Skin is warm and dry.  Neurological:     Mental Status: She is alert and oriented to person, place, and time.  Psychiatric:        Behavior: Behavior normal.        Thought Content: Thought content normal.        Judgment: Judgment normal.     Lab Results Lab Results  Component Value Date   WBC 4.6 10/18/2021   HGB 9.9 (L) 10/18/2021   HCT 33.2 (L) 10/18/2021   MCV 89.0 10/18/2021   PLT 283 10/18/2021    Lab Results  Component Value Date   CREATININE 1.74 (H) 10/18/2021   BUN 34 (H) 10/18/2021   NA 139 10/18/2021   K 4.1 10/18/2021   CL 110 10/18/2021   CO2 22 10/18/2021    Lab Results  Component Value Date   ALT 15 10/18/2021   AST 18 10/18/2021   ALKPHOS 52 10/18/2021   BILITOT 0.6 10/18/2021     Microbiology: Recent Results (from the past 240 hour(s))  Blood Culture (routine x  2)     Status: Abnormal   Collection Time: 10/13/21 11:40 PM   Specimen: BLOOD  Result Value Ref Range Status   Specimen Description BLOOD BLOOD RIGHT WRIST  Final   Special Requests   Final    BOTTLES DRAWN AEROBIC AND ANAEROBIC Blood Culture adequate volume   Culture  Setup Time   Final    GRAM POSITIVE COCCI ANAEROBIC BOTTLE ONLY CRITICAL RESULT CALLED TO, READ BACK BY AND VERIFIED WITH: C/ PHARMD MARY S. 10/14/21 2058 A. LAFRANCE    Culture GROUP B STREP(S.AGALACTIAE)ISOLATED (A)  Final   Report Status 10/16/2021 FINAL  Final   Organism ID, Bacteria GROUP B STREP(S.AGALACTIAE)ISOLATED  Final      Susceptibility   Group b strep(s.agalactiae)isolated - MIC*    CLINDAMYCIN <=0.25 SENSITIVE Sensitive     AMPICILLIN <=0.25 SENSITIVE Sensitive     ERYTHROMYCIN <=0.12 SENSITIVE Sensitive     VANCOMYCIN 0.5 SENSITIVE Sensitive     CEFTRIAXONE <=0.12 SENSITIVE Sensitive     LEVOFLOXACIN 0.5 SENSITIVE Sensitive     PENICILLIN Value in next row Sensitive      SENSITIVE<=0.06Performed at [facility] [address] [place],  [address]    * GROUP B STREP(S.AGALACTIAE)ISOLATED  Blood Culture ID Panel (Reflexed)     Status: Abnormal   Collection Time: 10/13/21 11:40 PM  Result Value Ref Range Status   Enterococcus faecalis NOT DETECTED NOT DETECTED Final   Enterococcus Faecium NOT DETECTED NOT DETECTED Final   Listeria monocytogenes NOT DETECTED NOT DETECTED Final   Staphylococcus species NOT DETECTED NOT DETECTED Final   Staphylococcus aureus (BCID) NOT DETECTED NOT DETECTED Final   Staphylococcus epidermidis NOT DETECTED NOT DETECTED Final   Staphylococcus lugdunensis NOT DETECTED NOT DETECTED Final   Streptococcus species DETECTED (A) NOT DETECTED Final    Comment: CRITICAL RESULT CALLED TO, READ BACK BY AND VERIFIED WITH: C/ PHARMD MARY S. 10/14/21 2058 A. LAFRANCE    Streptococcus agalactiae DETECTED (A) NOT DETECTED Final    Comment: CRITICAL RESULT CALLED TO, READ  BACK BY AND VERIFIED WITH: C/ PHARMD MARY S. 10/14/21 2058 A. LAFRANCE    Streptococcus pneumoniae NOT DETECTED NOT DETECTED Final   Streptococcus pyogenes NOT DETECTED NOT DETECTED Final   A.calcoaceticus-baumannii NOT DETECTED NOT DETECTED Final   Bacteroides fragilis NOT DETECTED NOT DETECTED Final   Enterobacterales NOT DETECTED NOT DETECTED Final   Enterobacter cloacae complex NOT DETECTED  NOT DETECTED Final   Escherichia coli NOT DETECTED NOT DETECTED Final   Klebsiella aerogenes NOT DETECTED NOT DETECTED Final   Klebsiella oxytoca NOT DETECTED NOT DETECTED Final   Klebsiella pneumoniae NOT DETECTED NOT DETECTED Final   Proteus species NOT DETECTED NOT DETECTED Final   Salmonella species NOT DETECTED NOT DETECTED Final   Serratia marcescens NOT DETECTED NOT DETECTED Final   Haemophilus influenzae NOT DETECTED NOT DETECTED Final   Neisseria meningitidis NOT DETECTED NOT DETECTED Final   Pseudomonas aeruginosa NOT DETECTED NOT DETECTED Final   Stenotrophomonas maltophilia NOT DETECTED NOT DETECTED Final   Candida albicans NOT DETECTED NOT DETECTED Final   Candida auris NOT DETECTED NOT DETECTED Final   Candida glabrata NOT DETECTED NOT DETECTED Final   Candida krusei NOT DETECTED NOT DETECTED Final   Candida parapsilosis NOT DETECTED NOT DETECTED Final   Candida tropicalis NOT DETECTED NOT DETECTED Final   Cryptococcus neoformans/gattii NOT DETECTED NOT DETECTED Final    Comment: Performed at [facility], [place] [address] [place], [place] [address]  Blood Culture (routine x 2)     Status: None (Preliminary result)   Collection Time: 10/14/21 12:40 AM   Specimen: BLOOD  Result Value Ref Range Status   Specimen Description   Final    BLOOD BLOOD LEFT HAND Performed at [facility] [address] [place], [place] [address]    Special Requests   Final    BOTTLES DRAWN AEROBIC AND ANAEROBIC Blood Culture adequate volume Performed at [facility] [address], [place], [place] [address]    Culture   Final    NO GROWTH 4 DAYS Performed at [facility], [place] [address], [place],  [address]    Report Status PENDING  Incomplete  Resp Panel by RT-PCR (Flu A&B, Covid) Anterior Nasal Swab     Status: None   Collection Time: 10/14/21 12:50 AM   Specimen: Anterior Nasal Swab  Result Value Ref Range Status   SARS Coronavirus 2 by RT PCR NEGATIVE NEGATIVE Final    Comment: (NOTE) SARS-CoV-2 target nucleic acids are NOT DETECTED.  The SARS-CoV-2 RNA is generally detectable in upper respiratory specimens during the acute phase of infection. The lowest concentration of SARS-CoV-2 viral copies this assay can detect is 138 copies/mL. A negative result does not preclude SARS-Cov-2 infection and should not be used as the sole basis for treatment or other patient management decisions. A negative result may occur with  improper specimen collection/handling, submission of specimen other than nasopharyngeal swab, presence of viral mutation(s) within the areas targeted by this assay, and inadequate number of viral copies(<138 copies/mL). A negative result must be combined with clinical observations, patient history, and epidemiological information. The expected result is Negative.  Fact Sheet for Patients:  EntrepreneurPulse.com.au  Fact Sheet for Healthcare Providers:  IncredibleEmployment.be  This test is no t yet approved or cleared by the [place] FDA and  has been authorized for detection and/or diagnosis of SARS-CoV-2 by FDA under an Emergency Use Authorization (EUA). This EUA will remain  in effect (meaning this test can be used) for the duration of the COVID-19 declaration under Section 564(b)(1) of the Act, 21 U.S.C.section 360bbb-3(b)(1), unless the authorization is terminated  or revoked sooner.       Influenza A by PCR NEGATIVE NEGATIVE Final    Influenza B by PCR NEGATIVE NEGATIVE Final    Comment: (NOTE) The Xpert Xpress SARS-CoV-2/FLU/RSV plus assay is intended  as an aid in the diagnosis of influenza from Nasopharyngeal swab specimens and should not be used as a sole basis for treatment. Nasal washings and aspirates are unacceptable for Xpert Xpress SARS-CoV-2/FLU/RSV testing.  Fact Sheet for Patients: EntrepreneurPulse.com.au  Fact Sheet for Healthcare Providers: IncredibleEmployment.be  This test is not yet approved or cleared by the [place] FDA and has been authorized for detection and/or diagnosis of SARS-CoV-2 by FDA under an Emergency Use Authorization (EUA). This EUA will remain in effect (meaning this test can be used) for the duration of the COVID-19 declaration under Section 564(b)(1) of the Act, 21 U.S.C. section 360bbb-3(b)(1), unless the authorization is terminated or revoked.  Performed at [facility], [place] [address], [place], [place] [address]   MRSA Next Gen by PCR, Nasal     Status: Abnormal   Collection Time: 10/14/21  3:42 AM   Specimen: Nasal Mucosa; Nasal Swab  Result Value Ref Range Status   MRSA by PCR Next Gen DETECTED (A) NOT DETECTED Final    Comment: (NOTE) The GeneXpert MRSA Assay (FDA approved for NASAL specimens only), is one component of a comprehensive MRSA colonization surveillance program. It is not intended to diagnose MRSA infection nor to guide or monitor treatment for MRSA infections. Test performance is not FDA approved in patients less than 23 years old. Performed at [facility], [place] [address], [place], [place] [address]   Urine Culture     Status: None   Collection Time: 10/14/21  9:38 AM   Specimen: In/Out Cath Urine  Result Value Ref Range Status   Specimen Description   Final    IN/OUT CATH URINE Performed at [facility] [address], [place], [place] [address]     Special Requests   Final    NONE Performed at [facility], [place] [address] [place], [place] [address]    Culture   Final    NO GROWTH Performed at [facility], [place] [address] [place], [place] [address]    Report Status 10/15/2021 FINAL  Final  Culture, blood (Routine X 2) w Reflex to ID Panel     Status: None (Preliminary result)   Collection Time: 10/15/21  9:17 AM   Specimen: BLOOD  Result Value Ref Range Status   Specimen Description   Final    BLOOD BLOOD LEFT ARM Performed at [facility] [address], [place], [place] [address]    Special Requests   Final    BOTTLES DRAWN AEROBIC ONLY Blood Culture adequate volume Performed at [facility] [address], [place], [place] [address]    Culture   Final    NO GROWTH 3 DAYS Performed at [facility], [place] [address] [place], [place] [address]    Report Status PENDING  Incomplete  Culture, blood (Routine X 2) w Reflex to ID Panel     Status: None (Preliminary result)   Collection Time: 10/15/21  9:23 AM   Specimen: BLOOD  Result Value Ref Range Status   Specimen Description   Final    BLOOD BLOOD RIGHT HAND Performed at [facility] [address] [place], [place] [address]    Special Requests   Final    BOTTLES DRAWN AEROBIC AND ANAEROBIC Blood Culture adequate volume Performed at [facility] [address] [place], [place] [address]    Culture   Final    NO GROWTH  3 DAYS Performed at [facility], [place] [address] [place], [place] [address]    Report Status PENDING  Incomplete     Terri Piedra, NP [facility]  10/18/2021  1:03 PM

## 2021-10-19 DIAGNOSIS — B951 Streptococcus, group B, as the cause of diseases classified elsewhere: Secondary | ICD-10-CM | POA: Diagnosis not present

## 2021-10-19 DIAGNOSIS — R7881 Bacteremia: Secondary | ICD-10-CM | POA: Diagnosis not present

## 2021-10-19 LAB — BASIC METABOLIC PANEL
Anion gap: 8 (ref 5–15)
BUN: 38 mg/dL — ABNORMAL HIGH (ref 8–23)
CO2: 24 mmol/L (ref 22–32)
Calcium: 10.6 mg/dL — ABNORMAL HIGH (ref 8.9–10.3)
Chloride: 111 mmol/L (ref 98–111)
Creatinine, Ser: 1.63 mg/dL — ABNORMAL HIGH (ref 0.44–1.00)
GFR, Estimated: 31 mL/min — ABNORMAL LOW (ref >60)
Glucose, Bld: 170 mg/dL — ABNORMAL HIGH (ref 70–99)
Potassium: 4.2 mmol/L (ref 3.5–5.1)
Sodium: 143 mmol/L (ref 135–145)

## 2021-10-19 LAB — CBC
HCT: 32.7 % — ABNORMAL LOW (ref 36.0–46.0)
Hemoglobin: 9.7 g/dL — ABNORMAL LOW (ref 12.0–15.0)
MCH: 26.8 pg (ref 26.0–34.0)
MCHC: 29.7 g/dL — ABNORMAL LOW (ref 30.0–36.0)
MCV: 90.3 fL (ref 80.0–100.0)
Platelets: 296 10*3/uL (ref 150–400)
RBC: 3.62 MIL/uL — ABNORMAL LOW (ref 3.87–5.11)
RDW: 19.4 % — ABNORMAL HIGH (ref 11.5–15.5)
WBC: 5 10*3/uL (ref 4.0–10.5)
nRBC: 0 % (ref 0.0–0.2)

## 2021-10-19 LAB — CULTURE, BLOOD (ROUTINE X 2)
Culture: NO GROWTH
Special Requests: ADEQUATE

## 2021-10-19 LAB — GLUCOSE, CAPILLARY
Glucose-Capillary: 155 mg/dL — ABNORMAL HIGH (ref 70–99)
Glucose-Capillary: 179 mg/dL — ABNORMAL HIGH (ref 70–99)
Glucose-Capillary: 181 mg/dL — ABNORMAL HIGH (ref 70–99)
Glucose-Capillary: 195 mg/dL — ABNORMAL HIGH (ref 70–99)
Glucose-Capillary: 207 mg/dL — ABNORMAL HIGH (ref 70–99)

## 2021-10-19 LAB — MAGNESIUM: Magnesium: 2.1 mg/dL (ref 1.7–2.4)

## 2021-10-19 MED ORDER — AMOXICILLIN 500 MG PO CAPS: 1000.0000 mg | ORAL_CAPSULE | Freq: Two times a day (BID) | ORAL | 0 refills | Status: AC

## 2021-10-19 MED ORDER — FUROSEMIDE 10 MG/ML IJ SOLN
40.0000 mg | Freq: Once | INTRAMUSCULAR | Status: AC
  Administered 2021-10-19: 40 mg via INTRAVENOUS
  Filled 2021-10-19: qty 4

## 2021-10-19 NOTE — TOC Progression Note (Addendum)
Transition of Care [facility]) - Progression Note    Patient Details  Name: [name] [name] MRN: [id number] Date of Birth: 02/22/1932  [facility] CM/SW Contact  Leeroy Cha, RN Phone Number: 10/19/2021, 12:43 PM  Clinical Narrative:    Josem Kaufmann for insurance pending in system. 1423/auth still pending in the navihealth system. Tct-navihealth review is with the medical director at this time-1428. Peer to peer done auth denided.   Spoke with patient son Shanon Brow does not want to appeal the decision is fine if she has hhc at home. Tct-Brittany-hhc will be done through the [facility] home. Orders WILL NEED TO ACCOMPANY PATIENT WITH DC PACKET. Expected Discharge Plan: [place] Barriers to Discharge: Continued Medical Work up  Expected Discharge Plan and Services Expected Discharge Plan: [place] arrangements for the past 2 months: [place] Expected Discharge Date: 10/19/21                                     Social Determinants of Health (SDOH) Interventions    Readmission Risk Interventions   No data to display

## 2021-10-19 NOTE — Discharge Summary (Signed)
Physician Discharge Summary  [name] [name] NFA:[id number] DOB: 1932-11-16 DOA: 10/13/2021  PCP: Unk Pinto, MD  Admit date: 10/13/2021 Discharge date: 10/19/2021  Admitted From: ILF Disposition:  SNF  Recommendations for Outpatient Follow-up:  Follow up with PCP in 1-2 weeks Continue Amoxicillin for 7 more days  Home Health: none Equipment/Devices: none  Discharge Condition: stable CODE STATUS: DNR Diet Orders (From admission, onward)     Start     Ordered   10/14/21 0538  Diet heart healthy/carb modified Room service appropriate? Yes; Fluid consistency: Thin  Diet effective now       Question Answer Comment  Diet-HS Snack? Nothing   Room service appropriate? Yes   Fluid consistency: Thin      10/14/21 0537            HPI: Per admitting MD, [name] [name] is a 88 y.o. female with medical history significant for myeloproliferative neoplasm, essential hypertension, type 2 diabetes, CKD 4, hypothyroidism, seizure disorder, hyperlipidemia, obesity, iron deficiency anemia, GERD, vitamin D deficiency, who presented to [facility] ED from assisted living facility due to sudden onset shaking chills.  She was in her usual state of health prior to this.  Noted to be febrile with a temperature of 102.  EMS was activated. Upon presentation to the ED, work-up revealed acute hypoxia requiring 4 L [place] to maintain O2 saturation greater than 92%, hypotension and SIRS.  Code sepsis was called in the ED.  Additionally, the patient had asymmetrical lower extremity edema left greater than right.  Due to concern for possible pulmonary embolism, heparin drip was started by EDP.  TRH, hospitalist service was asked to admit.  Hospital Course / Discharge diagnoses: Principal problem Septic shock due to Streptococcus bacteremia-initially admitted to the ICU and required vasopressors.  Initially she was placed on broad-spectrum antibiotics, but upon speciation of the blood cultures to Streptococcus  she was narrowed to penicillin.  She has improved, shock physiology has resolved, and has returned to baseline.  ID was consulted and they are recommending 10 days of total antibiotics, transition to amoxicillin for 7 additional days upon discharge.  Unfortunately, source was not entirely clear   Active problems Acute hypoxic respiratory failure due to acute pulmonary edema due to acute on chronic diastolic CHF-she was initially on pressors, but once blood pressure normalized she was diuresed with Lasix, currently euvolemic and on room air.  Continue Lasix as an outpatient as before. 2D echo as above with normal EF and grade 2 diastolic dysfunction. CKD stage IV-baseline creatinine 1.8-2.1.  Currently at baseline.  Continue to monitor as an outpatient JAK2 positive myeloproliferative neoplasm-follows with Dr. Irene Limbo as an outpatient, currently being observed without specific treatment.  Counts are stable currently HTN-resume home regimen  Anemia of chronic kidney disease-hemoglobin stable Foot wound-x-ray looks well, no bony involvement.  No apparent infection Seizure disorder-continue home AEDs Hyperkalemia-received Lokelma x1. Hypothyroidism-continue Synthroid Obesity, class 1-BMI borderline at 32.  She would benefit from weight loss  DM2-A1c 5.6.  Continue home regimen  Discharge Instructions   Allergies as of 10/19/2021       Reactions   Ppd [tuberculin Purified Protein Derivative] Other (See Comments)   indurated        Medication List     STOP taking these medications    doxycycline 100 MG tablet Commonly known as: VIBRA-TABS       TAKE these medications    acetaminophen 500 MG tablet Commonly known as: TYLENOL Take 1,000 mg by  mouth every 6 (six) hours as needed for moderate pain.   allopurinol 100 MG tablet Commonly known as: ZYLOPRIM Take 1 tablet (100 mg total) by mouth 2 (two) times daily. Take  1 tablet  Daily  to prevent Gout.   amoxicillin 500 MG  capsule Commonly known as: AMOXIL Take 2 capsules (1,000 mg total) by mouth every 12 (twelve) hours for 7 days.   ascorbic acid 500 MG tablet Commonly known as: VITAMIN C Take 500 mg by mouth daily.   aspirin 81 MG tablet Take 81 mg by mouth daily.   atenolol 50 MG tablet Commonly known as: TENORMIN TAKE ONE TABLET BY MOUTH DAILY GENERIC EQUIVALENT FOR TENORMIN What changed: See the new instructions.   BD Veo Insulin Syringe U/F 31G X 15/64" 0.3 ML Misc Generic drug: Insulin Syringe-Needle U-100 2 (two) times daily.   cholecalciferol 25 MCG (1000 UNIT) tablet Commonly known as: VITAMIN D3 Take 3,000 Units by mouth daily.   fish oil-omega-3 fatty acids 1000 MG capsule Take 1,000 mg by mouth at bedtime.   furosemide 40 MG tablet Commonly known as: LASIX TAKE 1 TABLET BY MOUTH DAILY FOR FLUID RETENTION AND ANKLE SWELLING What changed:  how much to take how to take this when to take this additional instructions   HumuLIN 70/30 (70-30) 100 UNIT/ML injection Generic drug: insulin NPH-regular Human INJECT 20 UNITS UNDER THE SKIN TWICE DAILY WITH MEALS What changed: See the new instructions.   hydrocortisone 2.5 % rectal cream Commonly known as: ANUSOL-HC Place rectally 2 (two) times daily.   lansoprazole 30 MG capsule Commonly known as: PREVACID TAKE 1 CAPSULE BY MOUTH DAILY TO PREVENT HEARTBURN AND INDIGESTION. What changed:  how much to take how to take this when to take this additional instructions   levETIRAcetam 500 MG tablet Commonly known as: KEPPRA Take  1 tablet 2  x /day  with Meals  to Prevent Seizures   levothyroxine 100 MCG tablet Commonly known as: SYNTHROID TAKE 1 TABLET BY MOUTH DAILY ON AN EMPTY STOMACH WITH ONLY WATER FOR 30 MINUTES. NO ANTACIDS, CALCIUM OR MAGNESIUM FOR 4 HOURS. AVOID BIOTIN What changed: See the new instructions.   loratadine 10 MG tablet Commonly known as: CLARITIN Take 10 mg by mouth daily.   meclizine 25 MG  tablet Commonly known as: ANTIVERT Take   1 tablet 2 to 3 x /day  ONLY  if needed for Dizziness /Vertigo   onetouch ultrasoft lancets Check Blood Sugar  4 x /day  before Meals & Bedtime   (( Dx-  e11.22 ))   OneTouch Verio test strip Generic drug: glucose blood Check Blood Sugar 4 x /day  (( Dx-  e11.22 ))   OneTouch Verio w/Device Kit Check  Blood Sugar   4 x /day before Meals & Bedtime   (( Dx-  e11.22 ))   rOPINIRole 1 MG tablet Commonly known as: Requip Take  1/2 to 1 tablet  3 x /day  as needed for Restless Legs & Cramps / Patient knows to take by mouth   vitamin B-12 100 MCG tablet Commonly known as: CYANOCOBALAMIN Take 500 mcg by mouth daily.         Consultations: PCCM ID  Procedures/Studies:  DG CHEST PORT 1 VIEW  Result Date: 10/16/2021 CLINICAL DATA:  Shortness of breath EXAM: PORTABLE CHEST 1 VIEW COMPARISON:  Chest x-ray 10/16/2021 FINDINGS: The heart is enlarged. There central pulmonary vascular congestion. There is no focal lung consolidation or pneumothorax. There is no  pleural effusion. Osseous structures are within normal limits. IMPRESSION: Cardiomegaly with central pulmonary vascular congestion. Electronically Signed   By: Ronney Asters M.D.   On: 10/16/2021 18:25   DG CHEST PORT 1 VIEW  Result Date: 10/16/2021 CLINICAL DATA:  Dyspnea EXAM: PORTABLE CHEST 1 VIEW COMPARISON:  Radiographs 10/14/2021 FINDINGS: Stable cardiomegaly. Pulmonary vascular congestion. Slightly increased hazy interstitial and airspace opacities bilaterally suggestive of edema. Prominent central pulmonary arteries. Aortic calcification. No acute osseous abnormality. IMPRESSION: Increased interstitial and airspace opacities suggestive of worsening edema. Cardiomegaly and pulmonary vascular congestion. Electronically Signed   By: Placido Sou M.D.   On: 10/16/2021 08:11   VAS [place] LOWER EXTREMITY VENOUS (DVT)  Result Date: 10/14/2021  Lower Venous DVT Study Patient Name:  [name] [name]  Date of Exam:   10/14/2021 Medical Rec #: [id number]         Accession #:    [id number] Date of Birth: 1932-11-26        Patient Gender: F Patient Age:   44 years Exam Location:  [facility] Procedure:      VAS [place] LOWER EXTREMITY VENOUS (DVT) Referring Phys: Collier Bullock --------------------------------------------------------------------------------  Indications: Edema.  Comparison Study: No previous exam noted. Performing Technologist: Bobetta Lime BS, RVT  Examination Guidelines: A complete evaluation includes B-mode imaging, spectral Doppler, color Doppler, and power Doppler as needed of all accessible portions of each vessel. Bilateral testing is considered an integral part of a complete examination. Limited examinations for reoccurring indications may be performed as noted. The reflux portion of the exam is performed with the patient in reverse Trendelenburg.  +---------+---------------+---------+-----------+----------+--------------+ RIGHT    CompressibilityPhasicitySpontaneityPropertiesThrombus Aging +---------+---------------+---------+-----------+----------+--------------+ CFV      Full           Yes      Yes                                 +---------+---------------+---------+-----------+----------+--------------+ SFJ      Full                                                        +---------+---------------+---------+-----------+----------+--------------+ FV Prox  Full                                                        +---------+---------------+---------+-----------+----------+--------------+ FV Mid   Full                                                        +---------+---------------+---------+-----------+----------+--------------+ FV DistalFull                                                        +---------+---------------+---------+-----------+----------+--------------+ PFV      Full                                                         +---------+---------------+---------+-----------+----------+--------------+  POP      Full           Yes      Yes                                 +---------+---------------+---------+-----------+----------+--------------+ PTV      Full                                                        +---------+---------------+---------+-----------+----------+--------------+ PERO     Full                                                        +---------+---------------+---------+-----------+----------+--------------+   +---------+---------------+---------+-----------+----------+--------------+ LEFT     CompressibilityPhasicitySpontaneityPropertiesThrombus Aging +---------+---------------+---------+-----------+----------+--------------+ CFV      Full           Yes      Yes                                 +---------+---------------+---------+-----------+----------+--------------+ SFJ      Full                                                        +---------+---------------+---------+-----------+----------+--------------+ FV Prox  Full                                                        +---------+---------------+---------+-----------+----------+--------------+ FV Mid   Full                                                        +---------+---------------+---------+-----------+----------+--------------+ FV DistalFull                                                        +---------+---------------+---------+-----------+----------+--------------+ PFV      Full                                                        +---------+---------------+---------+-----------+----------+--------------+ POP      Full           Yes      Yes                                 +---------+---------------+---------+-----------+----------+--------------+  PTV      Full                                                         +---------+---------------+---------+-----------+----------+--------------+ PERO     Full                                                        +---------+---------------+---------+-----------+----------+--------------+     Summary: BILATERAL: - No evidence of deep vein thrombosis seen in the lower extremities, bilaterally. -No evidence of popliteal cyst, bilaterally.   *See table(s) above for measurements and observations. Electronically signed by Harold Barban MD on 10/14/2021 at 8:47:23 PM.    Final    ECHOCARDIOGRAM COMPLETE  Result Date: 10/14/2021    ECHOCARDIOGRAM REPORT   Patient Name:   [name] [name] Date of Exam: 10/14/2021 Medical Rec #:  [id number]        Height:       62.0 in Accession #:    [id number]       Weight:       184.7 lb Date of Birth:  Nov 17, 1932       BSA:          1.848 m Patient Age:    52 years         BP:           125/31 mmHg Patient Gender: F                HR:           77 bpm. Exam Location:  Inpatient Procedure: 2D Echo, Cardiac Doppler and Color Doppler Indications:    CHF  History:        Patient has prior history of Echocardiogram examinations, most                 recent 09/18/2013. Risk Factors:Dyslipidemia and Hypertension.  Sonographer:    Jefferey Pica Referring Phys: [id number] [place]  1. Left ventricular ejection fraction, by estimation, is 60 to 65%. The left ventricle has normal function. The left ventricle has no regional wall motion abnormalities. There is moderate concentric left ventricular hypertrophy. Left ventricular diastolic parameters are consistent with Grade II diastolic dysfunction (pseudonormalization). Elevated left ventricular end-diastolic pressure. There is the interventricular septum is flattened in systole, consistent with right ventricular pressure overload.  2. Right ventricular systolic function is normal. The right ventricular size is normal. There is severely elevated pulmonary artery systolic pressure.  3. Right  atrial size was mildly dilated.  4. The mitral valve is grossly normal. Trivial mitral valve regurgitation. No evidence of mitral stenosis.  5. DI 0.31, suggests moderate stenosis. The aortic valve is calcified. There is moderate calcification of the aortic valve. There is moderate thickening of the aortic valve. Aortic valve regurgitation is not visualized. Moderate aortic valve stenosis.  6. The inferior vena cava is dilated in size with <50% respiratory variability, suggesting right atrial pressure of 15 mmHg. Comparison(s): Changes from prior study are noted. Grade 2 diastolic dysfunction with elevated LVEDP and elevated RVSP, moderate aortic stenosis. FINDINGS  Left Ventricle: Left ventricular ejection fraction, by estimation, is  60 to 65%. The left ventricle has normal function. The left ventricle has no regional wall motion abnormalities. The left ventricular internal cavity size was normal in size. There is  moderate concentric left ventricular hypertrophy. The interventricular septum is flattened in systole, consistent with right ventricular pressure overload. Left ventricular diastolic parameters are consistent with Grade II diastolic dysfunction (pseudonormalization). Elevated left ventricular end-diastolic pressure. Right Ventricle: The right ventricular size is normal. Right vetricular wall thickness was not well visualized. Right ventricular systolic function is normal. There is severely elevated pulmonary artery systolic pressure. The tricuspid regurgitant velocity is 3.68 m/s, and with an assumed right atrial pressure of 15 mmHg, the estimated right ventricular systolic pressure is 56.8 mmHg. Left Atrium: Left atrial size was normal in size. Right Atrium: Right atrial size was mildly dilated. Pericardium: There is no evidence of pericardial effusion. Mitral Valve: The mitral valve is grossly normal. Trivial mitral valve regurgitation. No evidence of mitral valve stenosis. Tricuspid Valve: The  tricuspid valve is normal in structure. Tricuspid valve regurgitation is mild . No evidence of tricuspid stenosis. Aortic Valve: DI 0.31, suggests moderate stenosis. The aortic valve is calcified. There is moderate calcification of the aortic valve. There is moderate thickening of the aortic valve. Aortic valve regurgitation is not visualized. Moderate aortic stenosis is present. Aortic valve mean gradient measures 35.8 mmHg. Aortic valve peak gradient measures 67.2 mmHg. Aortic valve area, by VTI measures 0.88 cm. Pulmonic Valve: The pulmonic valve was grossly normal. Pulmonic valve regurgitation is trivial. No evidence of pulmonic stenosis. Aorta: The aortic root, ascending aorta, aortic arch and descending aorta are all structurally normal, with no evidence of dilitation or obstruction. Venous: The inferior vena cava is dilated in size with less than 50% respiratory variability, suggesting right atrial pressure of 15 mmHg. IAS/Shunts: The atrial septum is grossly normal.  LEFT VENTRICLE PLAX 2D LVIDd:         3.90 cm   Diastology LVIDs:         2.60 cm   LV e' medial:    4.16 cm/s LV PW:         1.30 cm   LV E/e' medial:  38.0 LV IVS:        1.40 cm   LV e' lateral:   4.53 cm/s LVOT diam:     1.90 cm   LV E/e' lateral: 34.9 LV SV:         83 LV SV Index:   45 LVOT Area:     2.84 cm  RIGHT VENTRICLE          IVC RV Basal diam:  2.50 cm  IVC diam: 2.50 cm LEFT ATRIUM             Index        RIGHT ATRIUM           Index LA diam:        4.30 cm 2.33 cm/m   RA Area:     18.10 cm LA Vol (A2C):   44.3 ml 23.97 ml/m  RA Volume:   53.70 ml  29.05 ml/m LA Vol (A4C):   51.5 ml 27.86 ml/m LA Biplane Vol: 49.3 ml 26.67 ml/m  AORTIC VALVE                     PULMONIC VALVE AV Area (Vmax):    0.89 cm      PV Vmax:       0.98 m/s AV  Area (Vmean):   0.86 cm      PV Peak grad:  3.9 mmHg AV Area (VTI):     0.88 cm AV Vmax:           409.80 cm/s AV Vmean:          276.000 cm/s AV VTI:            0.943 m AV Peak Grad:       67.2 mmHg AV Mean Grad:      35.8 mmHg LVOT Vmax:         129.00 cm/s LVOT Vmean:        83.400 cm/s LVOT VTI:          0.294 m LVOT/AV VTI ratio: 0.31  AORTA Ao Root diam: 3.20 cm Ao Asc diam:  2.50 cm MITRAL VALVE                TRICUSPID VALVE MV Area (PHT): 4.17 cm     TR Peak grad:   54.2 mmHg MV Decel Time: 182 msec     TR Vmax:        368.00 cm/s MR Peak grad: 59.3 mmHg MR Vmax:      385.00 cm/s   SHUNTS MV E velocity: 158.00 cm/s  Systemic VTI:  0.29 m MV A velocity: 110.00 cm/s  Systemic Diam: 1.90 cm MV E/A ratio:  1.44 Buford Dresser MD Electronically signed by Buford Dresser MD Signature Date/Time: 10/14/2021/4:05:40 PM    Final    DG Foot Complete Left  Result Date: 10/14/2021 CLINICAL DATA:  88 year old female with fever, foot wound. EXAM: LEFT FOOT - COMPLETE 3+ VIEW COMPARISON:  Left foot series 10/05/2019. 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 Bone mineralization and mild periarticular spurring and irregularity there appears stable since 2021. Other degenerative MTP subluxations. Chronic bulky dorsal midfoot degenerative spurring. No acute fracture, dislocation, or osteolysis identified. No soft tissue gas or discrete ulceration identified. IMPRESSION: Stable degenerative changes since 2021. No acute osseous abnormality identified. Electronically Signed   By: Genevie Ann M.D.   On: 10/14/2021 09:25   DG Chest Port 1 View  Result Date: 10/14/2021 CLINICAL DATA:  Questionable sepsis - evaluate for abnormality EXAM: PORTABLE CHEST 1 VIEW COMPARISON:  04/03/2020 FINDINGS: Cardiomegaly, vascular congestion. No overt edema. No confluent opacities or effusions. No acute bony abnormality. Aortic atherosclerosis. IMPRESSION: Cardiomegaly, vascular congestion Electronically Signed   By: Rolm Baptise M.D.   On: 10/14/2021 00:29     Subjective: - no chest pain, shortness of breath, no abdominal pain, nausea or vomiting.    Discharge Exam: BP (!) 150/59 (BP Location: Left Arm)   Pulse (!) 102   Temp 97.9 F (36.6 C) (Oral)   Resp 20   Ht 5' 2" (1.575 m)   Wt 81.5 kg   SpO2 91%   BMI 32.86 kg/m   General: Pt is alert, awake, not in acute distress Cardiovascular: RRR, S1/S2 +, no rubs, no gallops Respiratory: CTA bilaterally, no wheezing, no rhonchi Abdominal: Soft, NT, ND, bowel sounds + Extremities: no edema, no cyanosis   The results of significant diagnostics from this hospitalization (including imaging, microbiology, ancillary and laboratory) are listed below for reference.     Microbiology: Recent Results (from the past 240 hour(s))  Blood Culture (routine x 2)     Status: Abnormal   Collection Time: 10/13/21 11:40 PM   Specimen: BLOOD  Result Value Ref Range Status  Specimen Description BLOOD BLOOD RIGHT WRIST  Final   Special Requests   Final    BOTTLES DRAWN AEROBIC AND ANAEROBIC Blood Culture adequate volume   Culture  Setup Time   Final    GRAM POSITIVE COCCI ANAEROBIC BOTTLE ONLY CRITICAL RESULT CALLED TO, READ BACK BY AND VERIFIED WITH: C/ PHARMD MARY S. 10/14/21 2058 A. LAFRANCE    Culture GROUP B STREP(S.AGALACTIAE)ISOLATED (A)  Final   Report Status 10/16/2021 FINAL  Final   Organism ID, Bacteria GROUP B STREP(S.AGALACTIAE)ISOLATED  Final      Susceptibility   Group b strep(s.agalactiae)isolated - MIC*    CLINDAMYCIN <=0.25 SENSITIVE Sensitive     AMPICILLIN <=0.25 SENSITIVE Sensitive     ERYTHROMYCIN <=0.12 SENSITIVE Sensitive     VANCOMYCIN 0.5 SENSITIVE Sensitive     CEFTRIAXONE <=0.12 SENSITIVE Sensitive     LEVOFLOXACIN 0.5 SENSITIVE Sensitive     PENICILLIN Value in next row Sensitive      SENSITIVE<=0.06Performed at [facility] [address], [place], [place] [address]    * GROUP B STREP(S.AGALACTIAE)ISOLATED  Blood Culture ID Panel (Reflexed)     Status: Abnormal   Collection Time: 10/13/21 11:40 PM  Result Value Ref Range Status   Enterococcus  faecalis NOT DETECTED NOT DETECTED Final   Enterococcus Faecium NOT DETECTED NOT DETECTED Final   Listeria monocytogenes NOT DETECTED NOT DETECTED Final   Staphylococcus species NOT DETECTED NOT DETECTED Final   Staphylococcus aureus (BCID) NOT DETECTED NOT DETECTED Final   Staphylococcus epidermidis NOT DETECTED NOT DETECTED Final   Staphylococcus lugdunensis NOT DETECTED NOT DETECTED Final   Streptococcus species DETECTED (A) NOT DETECTED Final    Comment: CRITICAL RESULT CALLED TO, READ BACK BY AND VERIFIED WITH: C/ PHARMD MARY S. 10/14/21 2058 A. LAFRANCE    Streptococcus agalactiae DETECTED (A) NOT DETECTED Final    Comment: CRITICAL RESULT CALLED TO, READ BACK BY AND VERIFIED WITH: C/ PHARMD MARY S. 10/14/21 2058 A. LAFRANCE    Streptococcus pneumoniae NOT DETECTED NOT DETECTED Final   Streptococcus pyogenes NOT DETECTED NOT DETECTED Final   A.calcoaceticus-baumannii NOT DETECTED NOT DETECTED Final   Bacteroides fragilis NOT DETECTED NOT DETECTED Final   Enterobacterales NOT DETECTED NOT DETECTED Final   Enterobacter cloacae complex NOT DETECTED NOT DETECTED Final   Escherichia coli NOT DETECTED NOT DETECTED Final   Klebsiella aerogenes NOT DETECTED NOT DETECTED Final   Klebsiella oxytoca NOT DETECTED NOT DETECTED Final   Klebsiella pneumoniae NOT DETECTED NOT DETECTED Final   Proteus species NOT DETECTED NOT DETECTED Final   Salmonella species NOT DETECTED NOT DETECTED Final   Serratia marcescens NOT DETECTED NOT DETECTED Final   Haemophilus influenzae NOT DETECTED NOT DETECTED Final   Neisseria meningitidis NOT DETECTED NOT DETECTED Final   Pseudomonas aeruginosa NOT DETECTED NOT DETECTED Final   Stenotrophomonas maltophilia NOT DETECTED NOT DETECTED Final   Candida albicans NOT DETECTED NOT DETECTED Final   Candida auris NOT DETECTED NOT DETECTED Final   Candida glabrata NOT DETECTED NOT DETECTED Final   Candida krusei NOT DETECTED NOT DETECTED Final   Candida  parapsilosis NOT DETECTED NOT DETECTED Final   Candida tropicalis NOT DETECTED NOT DETECTED Final   Cryptococcus neoformans/gattii NOT DETECTED NOT DETECTED Final    Comment: Performed at [facility], [address] [place], [place] [address]  Blood Culture (routine x 2)     Status: None   Collection Time: 10/14/21 12:40 AM   Specimen: BLOOD  Result Value Ref  Range Status   Specimen Description   Final    BLOOD BLOOD LEFT HAND Performed at [facility] [address], [place], [place] [address]    Special Requests   Final    BOTTLES DRAWN AEROBIC AND ANAEROBIC Blood Culture adequate volume Performed at [facility] [address], [place], [place] [address]    Culture   Final    NO GROWTH 5 DAYS Performed at [facility], [place] [address] [place], [place] [address]    Report Status 10/19/2021 FINAL  Final  Resp Panel by RT-PCR (Flu A&B, Covid) Anterior Nasal Swab     Status: None   Collection Time: 10/14/21 12:50 AM   Specimen: Anterior Nasal Swab  Result Value Ref Range Status   SARS Coronavirus 2 by RT PCR NEGATIVE NEGATIVE Final    Comment: (NOTE) SARS-CoV-2 target nucleic acids are NOT DETECTED.  The SARS-CoV-2 RNA is generally detectable in upper respiratory specimens during the acute phase of infection. The lowest concentration of SARS-CoV-2 viral copies this assay can detect is 138 copies/mL. A negative result does not preclude SARS-Cov-2 infection and should not be used as the sole basis for treatment or other patient management decisions. A negative result may occur with  improper specimen collection/handling, submission of specimen other than nasopharyngeal swab, presence of viral mutation(s) within the areas targeted by this assay, and inadequate number of viral copies(<138 copies/mL). A negative result must be combined with clinical observations, patient history, and epidemiological information. The expected  result is Negative.  Fact Sheet for Patients:  EntrepreneurPulse.com.au  Fact Sheet for Healthcare Providers:  IncredibleEmployment.be  This test is no t yet approved or cleared by the [place] FDA and  has been authorized for detection and/or diagnosis of SARS-CoV-2 by FDA under an Emergency Use Authorization (EUA). This EUA will remain  in effect (meaning this test can be used) for the duration of the COVID-19 declaration under Section 564(b)(1) of the Act, 21 U.S.C.section 360bbb-3(b)(1), unless the authorization is terminated  or revoked sooner.       Influenza A by PCR NEGATIVE NEGATIVE Final   Influenza B by PCR NEGATIVE NEGATIVE Final    Comment: (NOTE) The Xpert Xpress SARS-CoV-2/FLU/RSV plus assay is intended as an aid in the diagnosis of influenza from Nasopharyngeal swab specimens and should not be used as a sole basis for treatment. Nasal washings and aspirates are unacceptable for Xpert Xpress SARS-CoV-2/FLU/RSV testing.  Fact Sheet for Patients: EntrepreneurPulse.com.au  Fact Sheet for Healthcare Providers: IncredibleEmployment.be  This test is not yet approved or cleared by the [place] FDA and has been authorized for detection and/or diagnosis of SARS-CoV-2 by FDA under an Emergency Use Authorization (EUA). This EUA will remain in effect (meaning this test can be used) for the duration of the COVID-19 declaration under Section 564(b)(1) of the Act, 21 U.S.C. section 360bbb-3(b)(1), unless the authorization is terminated or revoked.  Performed at [facility], [place] [address], [place], [place] [address]   MRSA Next Gen by PCR, Nasal     Status: Abnormal   Collection Time: 10/14/21  3:42 AM   Specimen: Nasal Mucosa; Nasal Swab  Result Value Ref Range Status   MRSA by PCR Next Gen DETECTED (A) NOT DETECTED Final    Comment: (NOTE) The GeneXpert MRSA Assay  (FDA approved for NASAL specimens only), is one component of a comprehensive MRSA colonization surveillance program. It is not intended to diagnose MRSA infection nor  to guide or monitor treatment for MRSA infections. Test performance is not FDA approved in patients less than 79 years old. Performed at [facility], [place] [address] [place], [place] [address]   Urine Culture     Status: None   Collection Time: 10/14/21  9:38 AM   Specimen: In/Out Cath Urine  Result Value Ref Range Status   Specimen Description   Final    IN/OUT CATH URINE Performed at [place] [address] [place], [place] [address]    Special Requests   Final    NONE Performed at [facility], [place] [address] [place], [place] [address]    Culture   Final    NO GROWTH Performed at [facility], [place] [address], [place], [place] [address]    Report Status 10/15/2021 FINAL  Final  Culture, blood (Routine X 2) w Reflex to ID Panel     Status: None (Preliminary result)   Collection Time: 10/15/21  9:17 AM   Specimen: BLOOD  Result Value Ref Range Status   Specimen Description   Final    BLOOD BLOOD LEFT ARM Performed at [place] [address], [place], [place] [address]    Special Requests   Final    BOTTLES DRAWN AEROBIC ONLY Blood Culture adequate volume Performed at [facility] [place] [address] [place], [place] [address]    Culture   Final    NO GROWTH 4 DAYS Performed at [facility], [place] [address] [place], [place] [address]    Report Status PENDING  Incomplete  Culture, blood (Routine X 2) w Reflex to ID Panel     Status: None (Preliminary result)   Collection Time: 10/15/21  9:23 AM   Specimen: BLOOD  Result Value Ref Range Status   Specimen Description   Final    BLOOD BLOOD RIGHT HAND Performed at [facility] [address], [place], [place] [address]     Special Requests   Final    BOTTLES DRAWN AEROBIC AND ANAEROBIC Blood Culture adequate volume Performed at [facility] [address], [place], [place] [address]    Culture   Final    NO GROWTH 4 DAYS Performed at [facility], [place] [address] [place],  [address]    Report Status PENDING  Incomplete     Labs: Basic Metabolic Panel: Recent Labs  Lab 10/14/21 0853 10/15/21 0311 10/16/21 0317 10/17/21 0313 10/18/21 0517 10/19/21 0538  NA 141  141 131*  133* 137 138 139 143  K 4.3  4.4 5.0  5.3* 4.1 4.1 4.1 4.2  CL 112*  112* 104  106 110 110 110 111  CO2 22  22 20*  20* 20* _0 GLUCOSE 192*  190* 387*  408* 174* 172* 191* 170*  BUN 42*  41* 45*  44* 38* 33* 34* 38*  CREATININE 2.27*  2.17* 2.05*  2.04* 1.91* 1.73* 1.74* 1.63*  CALCIUM 9.1  9.1 8.4*  8.6* 9.4 9.5 9.9 10.6*  MG 1.9 2.0 2.0 2.1 1.9 2.1  PHOS 3.1  3.2 2.9  3.0 2.6 2.7  --   --    Liver Function Tests: Recent Labs  Lab 10/14/21 0853 10/15/21 0311 10/16/21 0317 10/17/21 0313 10/18/21 0517  AST 22 16 13* 18 18  ALT _1 ALKPHOS 56 49 49 54 52  BILITOT 0.9  0.9 0.8 0.8 0.6  PROT 5.6* 5.2* 5.2* 5.6* 5.5*  ALBUMIN 2.9*  2.9* 2.7*  2.7* 2.6* 2.7* 2.6*   CBC: Recent Labs  Lab 10/13/21 2352 10/14/21 0853 10/15/21 0311 10/16/21 0317 10/17/21 0313 10/18/21 0517 10/19/21 0538  WBC 12.5* 16.5* 9.5 5.6 4.1 4.6 5.0  NEUTROABS 10.9* 13.9* 7.2 4.2 2.7  --   --   HGB 10.9* 10.7* 10.0* 9.4* 9.5* 9.9* 9.7*  HCT 36.6 36.0 34.2* 31.8* 31.6* 33.2* 32.7*  MCV 89.7 90.0 92.9 91.9 91.3 89.0 90.3  PLT 351 372 305 254 278 283 296   CBG: Recent Labs  Lab 10/18/21 1118 10/18/21 1615 10/18/21 2052 10/19/21 0009 10/19/21 0747  GLUCAP 148* 144* 161* 195* 155*   Hgb A1c No results for input(s): "HGBA1C" in the last 72 hours. Lipid Profile No results for input(s): "CHOL", "HDL", "LDLCALC", "TRIG", "CHOLHDL", "LDLDIRECT" in the last 72  hours. Thyroid function studies No results for input(s): "TSH", "T4TOTAL", "T3FREE", "THYROIDAB" in the last 72 hours.  Invalid input(s): "FREET3" Urinalysis    Component Value Date/Time   COLORURINE YELLOW 10/14/2021 [place] 10/14/2021 0938   LABSPEC 1.014 10/14/2021 0938   PHURINE 5.0 10/14/2021 0938   GLUCOSEU NEGATIVE 10/14/2021 0938   HGBUR NEGATIVE 10/14/2021 0938   BILIRUBINUR NEGATIVE 10/14/2021 0938   KETONESUR NEGATIVE 10/14/2021 0938   PROTEINUR NEGATIVE 10/14/2021 0938   UROBILINOGEN 0.2 06/06/2014 0940   NITRITE NEGATIVE 10/14/2021 0938   LEUKOCYTESUR SMALL (A) 10/14/2021 0938    FURTHER DISCHARGE INSTRUCTIONS:   Get Medicines reviewed and adjusted: Please take all your medications with you for your next visit with your Primary MD   Laboratory/radiological data: Please request your Primary MD to go over all hospital tests and procedure/radiological results at the follow up, please ask your Primary MD to get all Hospital records sent to his/her office.   In some cases, they will be blood work, cultures and biopsy results pending at the time of your discharge. Please request that your primary care M.D. goes through all the records of your hospital data and follows up on these results.   Also Note the following: If you experience worsening of your admission symptoms, develop shortness of breath, life threatening emergency, suicidal or homicidal thoughts you must seek medical attention immediately by calling 911 or calling your MD immediately  if symptoms less severe.   You must read complete instructions/literature along with all the possible adverse reactions/side effects for all the Medicines you take and that have been prescribed to you. Take any new Medicines after you have completely understood and accpet all the possible adverse reactions/side effects.    Do not drive when taking Pain medications or sleeping medications (Benzodaizepines)   Do  not take more than prescribed Pain, Sleep and Anxiety Medications. It is not advisable to combine anxiety,sleep and pain medications without talking with your primary care practitioner   Special Instructions: If you have smoked or chewed Tobacco  in the last 2 yrs please stop smoking, stop any regular Alcohol  and or any Recreational drug use.   Wear Seat belts while driving.   Please note: You were cared for by a hospitalist during your hospital stay. Once you are discharged, your primary care physician will handle any further medical issues. Please note that NO REFILLS for any discharge medications will be authorized once you are discharged, as it is imperative that you return to your primary care physician (or establish a relationship with a primary  care physician if you do not have one) for your post hospital discharge needs so that they can reassess your need for medications and monitor your lab values.  Time coordinating discharge: 40 minutes  SIGNED:  Marzetta Board, MD, PhD 10/19/2021, 8:36 AM

## 2021-10-19 NOTE — Progress Notes (Signed)
Physical Therapy Treatment Patient Details Name: [name] [name] MRN: [id number] DOB: 12/04/1932 Today's Date: 10/19/2021   History of Present Illness Patient is a 88 year old female who presented to the hosptial with hypoxia, shakiness, chills, and fever. patient was admitted with acute hypoxic respiratory failure secondary to pulmonary edema, septic shock,  PMH: hypertension, diabetes mellitus type 2, CKD stage IV, hypothyroidism, seizure disorder, hyperlipidemia, iron deficiency anemia, GERD, vitamin D deficiency, myeloproliferative neoplasm.    PT Comments    Pt ambulated in hallway and reports mild dizziness however feels this is from her not wearing her glasses (which are at home).  Pt anticipates discharging today.   Recommendations for follow up therapy are one component of a multi-disciplinary discharge planning process, led by the attending physician.  Recommendations may be updated based on patient status, additional functional criteria and insurance authorization.  Follow Up Recommendations  Skilled nursing-short term rehab (<3 hours/day) Can patient physically be transported by private vehicle: Yes   Assistance Recommended at Discharge Intermittent Supervision/Assistance  Patient can return home with the following A little help with walking and/or transfers;A little help with bathing/dressing/bathroom;Help with stairs or ramp for entrance;Assist for transportation;Assistance with cooking/housework   Equipment Recommendations  None recommended by PT    Recommendations for Other Services       Precautions / Restrictions Precautions Precaution Comments: monitor O2, HR     Mobility  Bed Mobility               General bed mobility comments: Pt in chair on arrival    Transfers Overall transfer level: Needs assistance Equipment used: Rolling walker (2 wheels) Transfers: Sit to/from Stand Sit to Stand: Supervision           General transfer comment: Pt with  good hand placement and safety awareness.    Ambulation/Gait Ambulation/Gait assistance: Min guard Gait Distance (Feet): 120 Feet Assistive device: Rolling walker (2 wheels) Gait Pattern/deviations: Decreased stride length, Step-through pattern Gait velocity: decr     General Gait Details: slow but steady, distance to tolerance, SPO2 92% on room air, HR 130 bpm   Stairs             Wheelchair Mobility    Modified Rankin (Stroke Patients Only)       Balance                                            Cognition Arousal/Alertness: Awake/alert Behavior During Therapy: WFL for tasks assessed/performed Overall Cognitive Status: Within Functional Limits for tasks assessed                                          Exercises      General Comments        Pertinent Vitals/Pain Pain Assessment Pain Assessment: No/denies pain    Home Living                          Prior Function            PT Goals (current goals can now be found in the care plan section) Progress towards PT goals: Progressing toward goals    Frequency    Min 3X/week      PT Plan Current  plan remains appropriate    Co-evaluation              AM-PAC PT "6 Clicks" Mobility   Outcome Measure  Help needed turning from your back to your side while in a flat bed without using bedrails?: None Help needed moving from lying on your back to sitting on the side of a flat bed without using bedrails?: A Little Help needed moving to and from a bed to a chair (including a wheelchair)?: A Little Help needed standing up from a chair using your arms (e.g., wheelchair or bedside chair)?: A Little Help needed to walk in hospital room?: A Little Help needed climbing 3-5 steps with a railing? : A Lot 6 Click Score: 18    End of Session   Activity Tolerance: Patient tolerated treatment well Patient left: in chair;with call bell/phone within  reach Nurse Communication: Mobility status PT Visit Diagnosis: Unsteadiness on feet (R26.81);Difficulty in walking, not elsewhere classified (R26.2)     Time: 1610-9604 PT Time Calculation (min) (ACUTE ONLY): 12 min  Charges:  $Gait Training: 8-22 mins                    Arlyce Dice, DPT Physical Therapist Acute Rehabilitation Services Preferred contact method: Secure Chat Weekend Pager Only: [phone] Office: [place] 10/19/2021, 12:08 PM

## 2021-10-20 LAB — GLUCOSE, CAPILLARY
Glucose-Capillary: 181 mg/dL — ABNORMAL HIGH (ref 70–99)
Glucose-Capillary: 176 mg/dL — ABNORMAL HIGH (ref 70–99)

## 2021-10-20 LAB — CBC
HCT: 32.6 % — ABNORMAL LOW (ref 36.0–46.0)
Hemoglobin: 9.7 g/dL — ABNORMAL LOW (ref 12.0–15.0)
MCH: 27.5 pg (ref 26.0–34.0)
MCHC: 29.8 g/dL — ABNORMAL LOW (ref 30.0–36.0)
MCV: 92.4 fL (ref 80.0–100.0)
Platelets: 312 10*3/uL (ref 150–400)
RBC: 3.53 MIL/uL — ABNORMAL LOW (ref 3.87–5.11)
RDW: 19.5 % — ABNORMAL HIGH (ref 11.5–15.5)
WBC: 5.3 10*3/uL (ref 4.0–10.5)
nRBC: 0.4 % — ABNORMAL HIGH (ref 0.0–0.2)

## 2021-10-20 LAB — CULTURE, BLOOD (ROUTINE X 2)
Culture: NO GROWTH
Culture: NO GROWTH
Special Requests: ADEQUATE
Special Requests: ADEQUATE

## 2021-10-20 NOTE — TOC Transition Note (Addendum)
Transition of Care [facility]) - CM/SW Discharge Note   Patient Details  Name: [name] [name] MRN: [id number] Date of Birth: Apr 18, 1932  Transition of Care [facility]) CM/SW Contact:  Ross Ludwig, LCSW Phone Number: 10/20/2021, 11:45 AM   Clinical Narrative:     CSW spoke to [place] at [employer], patient is from [employer] at [place].  Patient was denied by insurance company for SNF placement.  Patient will be going home with home health through [facility].  CSW spoke to patient's son Shanon Brow and he will pick patient up from the hospital in order to take her home.  CSW updated bedside nurse, plan to discharge back home with home health today.  [place] uses [employer], CSW called and left a message on voice mail letting them know patient is discharging today.  Final next level of care: [place] Barriers to Discharge: Barriers Resolved   Patient Goals and CMS Choice Patient states their goals for this hospitalization and ongoing recovery are:: To return back home with home health services. CMS Medicare.gov Compare Post Acute Care list provided to:: Patient Represenative (must comment) Choice offered to / list presented to : Adult Children  Discharge Placement  Home with home health                     Discharge Plan and [place] with home health PT.                        HH Arranged: PT [place] Agency: Other - See comment (Medi)        Social Determinants of Health (SDOH) Interventions     Readmission Risk Interventions     No data to display

## 2021-11-05 ENCOUNTER — Ambulatory Visit (INDEPENDENT_AMBULATORY_CARE_PROVIDER_SITE_OTHER): Payer: Medicare Other | Admitting: Internal Medicine

## 2021-11-05 ENCOUNTER — Encounter: Payer: Self-pay | Admitting: Internal Medicine

## 2021-11-05 VITALS — BP 122/58 | HR 86 | Temp 97.9°F | Resp 17 | Ht 62.0 in | Wt 176.0 lb

## 2021-11-05 DIAGNOSIS — Z0000 Encounter for general adult medical examination without abnormal findings: Secondary | ICD-10-CM

## 2021-11-05 DIAGNOSIS — Z0001 Encounter for general adult medical examination with abnormal findings: Secondary | ICD-10-CM

## 2021-11-05 DIAGNOSIS — Z136 Encounter for screening for cardiovascular disorders: Secondary | ICD-10-CM | POA: Diagnosis not present

## 2021-11-05 DIAGNOSIS — E559 Vitamin D deficiency, unspecified: Secondary | ICD-10-CM

## 2021-11-05 DIAGNOSIS — I10 Essential (primary) hypertension: Secondary | ICD-10-CM

## 2021-11-05 DIAGNOSIS — M1A9XX1 Chronic gout, unspecified, with tophus (tophi): Secondary | ICD-10-CM

## 2021-11-05 DIAGNOSIS — E1122 Type 2 diabetes mellitus with diabetic chronic kidney disease: Secondary | ICD-10-CM

## 2021-11-05 DIAGNOSIS — Z8249 Family history of ischemic heart disease and other diseases of the circulatory system: Secondary | ICD-10-CM | POA: Diagnosis not present

## 2021-11-05 DIAGNOSIS — E1149 Type 2 diabetes mellitus with other diabetic neurological complication: Secondary | ICD-10-CM

## 2021-11-05 DIAGNOSIS — Z1211 Encounter for screening for malignant neoplasm of colon: Secondary | ICD-10-CM

## 2021-11-05 DIAGNOSIS — E039 Hypothyroidism, unspecified: Secondary | ICD-10-CM

## 2021-11-05 DIAGNOSIS — N2581 Secondary hyperparathyroidism of renal origin: Secondary | ICD-10-CM

## 2021-11-05 DIAGNOSIS — Z79899 Other long term (current) drug therapy: Secondary | ICD-10-CM

## 2021-11-05 DIAGNOSIS — E1169 Type 2 diabetes mellitus with other specified complication: Secondary | ICD-10-CM

## 2021-11-05 DIAGNOSIS — K219 Gastro-esophageal reflux disease without esophagitis: Secondary | ICD-10-CM

## 2021-11-05 DIAGNOSIS — G40909 Epilepsy, unspecified, not intractable, without status epilepticus: Secondary | ICD-10-CM

## 2021-11-05 NOTE — Patient Instructions (Signed)

## 2021-11-05 NOTE — Progress Notes (Signed)
Annual Screening/Preventative Visit & Comprehensive Evaluation &  Examination  Future Appointments  Date Time Provider Department  11/05/2021              cpe 11:00 AM Unk Pinto, MD [facility]  12/04/2021  3:30 PM Carleene Mains, [facility]  01/04/2022 10:00 AM Brunetta Genera, MD [facility]  02/07/2022            wellness 11:00 AM Alycia Rossetti, NP [facility]  11/06/2022              cpe 10:00 AM Unk Pinto, MD [facility]         This very nice 88 y.o. WWF presents for a Screening /Preventative Visit & comprehensive evaluation and management of multiple medical co-morbidities.  Patient has been followed for HTN, HLD, T2_NIDDM/CKD4  and Vitamin D Deficiency. Patient has Gout controlled on her meds.  Patient is also followed by Dr Brett Fairy for a Seizure Disorder & by Dr Irene Limbo for anemia presumed of Kidney disease. Patient also is on CPAP for OSA with improved restorative Sleep.        Patient relates was recently in the "care center " at [facility] for Physical Therapy June thru August after a fall. Then she was recently hospitalized 8/26  -9/1   for Strep PNA complicated by CHF.         HTN predates since 1999. Patient's BP has been controlled at home . Cardiac echo in 2015 showed thickened Ao valve leaflets w/o stenosis or regurgitation.  Patient denies any cardiac symptoms as chest pain, palpitations, shortness of breath, dizziness or ankle swelling. Today's BP is at goal - 122/58 .        Patient's hyperlipidemia is controlled with diet and medications. Patient denies myalgias or other medication SE's. Last lipids were at goal:  Lab Results  Component Value Date   CHOL 110 05/05/2020   HDL 29 (L) 05/05/2020   LDLCALC 63 05/05/2020   TRIG 101 05/05/2020   CHOLHDL 3.8 05/05/2020         Patient has Morbid Obesity  (BMI  37 -> 34+) and consequent T2_NIDDM (2001) w/CKD4  (GFR 23). Patient is on bid Novolin 70/30 reporting CBG's usu ranging betw 100-180  mg%.   Patient has Secondary Hyperparathyroidism consequent of her Diabetic Kidney Disease.   Patient denies reactive hypoglycemic symptoms, visual blurring, diabetic polys or paresthesias. Last A1c was not at goal:  Lab Results  Component Value Date   HGBA1C 6.6 (H) 05/05/2020         Finally, patient has history of Vitamin D Deficiency ("37" /2008) and last Vitamin D was still low (Goal 70-100):   Lab Results  Component Value Date   VD25OH 49 05/05/2020     Current Outpatient Medications on File Prior to Visit  Medication Sig   acetaminophen 500 MG tablet Take 1,000 mg every 6 (six) hours as needed for moderate pain.   allopurinol 100 MG tablet Take  1 tablet  Daily  to prevent Gout.   aspirin 81 MG tablet Take  daily.   atenolol =50 MG tablet Take 1 tablet ( daily.   VITAMIN D 1000 u Take  daily.   fish oil-omega-3 1000 MG capsule Take 1,000 mg  at bedtime.   furosemide (LASIX) 40 MG tablet Take  1 tablet  Daily  for Fluid Retention & Ankle Swelling   HUMULIN (70/30) injec INJECT 20 UNITS TWICE DAILY    ANUSOL-HC 2.5 % rectal cream Place  rectally 2  times daily.   lansoprazole 30 MG capsule Take  1 capsule  Daily     levETIRAcetam (KEPPRA) 500 MG  Take  1 tablet  3 x /day  with Meals    levothyroxine 100 MCG tablet TAKE 1 TABLET DAILY    loratadine (CLARITIN) 10 MG tablet Take daily.   meclizine (ANTIVERT) 25 MG tablet Take  1 tablet 3 x /day  Only if needed    rOPINIRole (REQUIP) 1 MG tablet Take  1/2 to 1 tablet  3 x /day  as needed    vitamin B-12 100 MCG tablet Take  daily.    vitamin C 500 MG tablet Take daily.     Allergies  Allergen Reactions   Ppd [Tuberculin Purified Protein Derivative] Other (See Comments)    indurated    Past Medical History:  Diagnosis Date   Anemia    Arthritis    "knees; right shoulder" (09/15/2013)   Basal cell carcinoma    "right upper outer lip"   DM neuropathy, type II diabetes mellitus ([place])    Dyspnea 2021   with low iron    GERD (gastroesophageal reflux disease)    Gout    High cholesterol    Hypertension    Hypothyroidism    Meniere's disease    Obesity (BMI 30-39.9)    Other forms of epilepsy and recurrent seizures without mention of intractable epilepsy 11/04/2012   Non convulsive paroxysmal spells, responding to [facility], patient not driving.    Pneumonia ~ 1943   Skin cancer    "forehead; right hand"   Stage 4 chronic kidney disease ([place])    UTI (urinary tract infection)    Vitamin D deficiency      Health Maintenance  Topic Date Due   Zoster Vaccines- Shingrix (1 of 2) Never done   COVID-19 Vaccine (4 - Booster for Pfizer series) 02/18/2020   INFLUENZA VACCINE  09/18/2020   FOOT EXAM  10/30/2020   URINE MICROALBUMIN  10/31/2020   HEMOGLOBIN A1C  11/05/2020   OPHTHALMOLOGY EXAM  12/26/2020   TETANUS/TDAP  12/09/2027   DEXA SCAN  Completed   PNA vac Low Risk Adult  Completed   HPV VACCINES  Aged Out     Immunization History  Administered Date(s) Administered   DTaP 03/18/2007   Influenza, High Dose   10/18/2013, 12/12/2014, 10/24/2015, 10/28/2016, 12/08/2017, 12/01/2018   Influenza  11/18/2012, 12/02/2019   PFIZER SARS-COV -2 Vacc  03/10/2019, 03/31/2019, 11/26/2019   Pneumococcal -13 12/12/2014   Pneumococcal -23 09/16/2013   Td 12/08/2017   Zoster, Live 02/28/2005     Last Colon - 08/01/2008 - Dr Henrene Pastor - recc 5 yr f/u , but patient deferred   Last MGM - 10/20/2017 - Patient aware overdue having been sent reminder letters in 2019 and 2020   Past Surgical History:  Procedure Laterality Date   BIOPSY  04/05/2020   Procedure: BIOPSY;  Surgeon: Jerene Bears, MD;  Location: [facility];  Service: Gastroenterology;;   CERVICAL POLYPECTOMY     COLONOSCOPY WITH PROPOFOL N/A 04/06/2020   Procedure: COLONOSCOPY WITH PROPOFOL;  Surgeon: Jerene Bears, MD;  Location: [facility];  Service: Gastroenterology;  Laterality: N/A;   ESOPHAGOGASTRODUODENOSCOPY (EGD) WITH PROPOFOL N/A 04/05/2020    Procedure: ESOPHAGOGASTRODUODENOSCOPY (EGD) WITH PROPOFOL;  Surgeon: Jerene Bears, MD;  [facility];  Service: Gastroenterology;  Laterality: N/A;   HAMMER TOE SURGERY Left 1980's   MOHS SURGERY  20087   "right upper outer  lip"   POLYPECTOMY  04/06/2020   Procedure: POLYPECTOMY;  Surgeon: Jerene Bears, MD;  Location: [facility];  Service: Gastroenterology;;   TONSILLECTOMY  ~ [place] Left ~ 1950   "ran arm thru window"     Family History  Problem Relation Age of Onset   Heart disease Mother    Stroke Mother    Heart disease Father    Ovarian cancer Sister    Hypertension Son    Hypertension Son    Diabetes Son    Alcohol abuse Son      Social History   Tobacco Use   Smoking status: Former    Packs/day: 1.00    Years: 30.00    Pack years: 30.00    Types: Cigarettes    Quit date: 06/29/1983    Years since quitting: 37.3   Smokeless tobacco: Never  Vaping Use   Vaping Use: Never used  Substance Use Topics   Alcohol use: Yes    Alcohol/week: 0.0 standard drinks    Comment: 09/15/2013 "glass of wine maybe once/year"   Drug use: No      ROS Constitutional: Denies fever, chills, weight loss/gain, headaches, insomnia,  night sweats, and change in appetite. Does c/o fatigue. Eyes: Denies redness, blurred vision, diplopia, discharge, itchy, watery eyes.  ENT: Denies discharge, congestion, post nasal drip, epistaxis, sore throat, earache, hearing loss, dental pain, Tinnitus, Vertigo, Sinus pain, snoring.  Cardio: Denies chest pain, palpitations, irregular heartbeat, syncope, dyspnea, diaphoresis, orthopnea, PND, claudication, edema Respiratory: denies cough, dyspnea, DOE, pleurisy, hoarseness, laryngitis, wheezing.  Gastrointestinal: Denies dysphagia, heartburn, reflux, water brash, pain, cramps, nausea, vomiting, bloating, diarrhea, constipation, hematemesis, melena, hematochezia, jaundice, hemorrhoids Genitourinary: Denies dysuria, frequency,  urgency, nocturia, hesitancy, discharge, hematuria, flank pain Breast: Breast lumps, nipple discharge, bleeding.  Musculoskeletal: Denies arthralgia, myalgia, stiffness, Jt. Swelling, pain, limp, and strain/sprain. Denies falls. Skin: Denies puritis, rash, hives, warts, acne, eczema, changing in skin lesion Neuro: No weakness, tremor, incoordination, spasms, paresthesia, pain Psychiatric: Denies confusion, memory loss, sensory loss. Denies Depression. Endocrine: Denies change in weight, skin, hair change, nocturia, and paresthesia, diabetic polys, visual blurring, hyper / hypo glycemic episodes.  Heme/Lymph: No excessive bleeding, bruising, enlarged lymph nodes.  Physical Exam  BP (!) 122/58   Pulse 86   Temp 97.9 F (36.6 C)   Resp 17   Ht '5\' 2"'$  (1.575 m)   Wt 176 lb (79.8 kg)   SpO2 97%   BMI 32.19 kg/m   General Appearance: over nourished  and in no apparent distress.  Eyes: PERRLA, EOMs, conjunctiva no swelling or erythema, normal fundi and vessels. Sinuses: No frontal/maxillary tenderness ENT/Mouth: EACs patent / TMs  nl. Nares clear without erythema, swelling, mucoid exudates. Oral hygiene is good. No erythema, swelling, or exudate. Tongue normal, non-obstructing. Tonsils not swollen or erythematous. Hearing normal.  Neck: Supple, thyroid not palpable. No bruits, nodes or JVD. Respiratory: Respiratory effort normal.  BS equal and clear bilateral without rales, rhonci, wheezing or stridor. Cardio: Heart sounds are normal with regular rate and rhythm and a grade 2-3 systolic aortic murmur parasternal and transmitted to neck & carotids bilaterally. Peripheral pulses are normal and equal bilaterally without edema. No aortic or femoral bruits. Chest:  moderate kyphosis & symmetric with normal excursions and percussion. Breasts: Symmetric, without lumps, nipple discharge, retractions, or fibrocystic changes.  Abdomen: Rotund, soft with bowel sounds active. Nontender, no guarding,  rebound, hernias, masses, or organomegaly.  Lymphatics: Non tender without lymphadenopathy.  Musculoskeletal:  Full ROM all peripheral extremities, joint stability, 5/5 strength and gait stabilized with a walker. Skin: Warm and dry without rashes, lesions, cyanosis, clubbing or  ecchymosis.  Neuro: Cranial nerves intact, reflexes equal bilaterally. Normal muscle tone, no cerebellar symptoms. Sensation decreased to touch, vibratory and Monofilament to the toes bilaterally. Pysch: Alert and oriented x 3, normal affect, Insight and Judgment appropriate.    Assessment and Plan  1. Annual Preventative Screening Examination   2. Morbid obesity ([place]) BMI 35+ with T2DM, htn, hyperlipidemia, CKD, OSA   3. Essential hypertension  - EKG 12-Lead - Urinalysis, Routine w reflex microscopic - Microalbumin / creatinine urine ratio - CBC with Differential/Platelet - COMPLETE METABOLIC PANEL WITH GFR - Magnesium - TSH  4. Hyperlipidemia associated with type 2 diabetes mellitus (HCC)  - Lipid panel - TSH  5. Type 2 diabetes mellitus with stage 4 chronic kidney  disease, with long-term current use of insulin (HCC)  - COMPLETE METABOLIC PANEL WITH GFR - Hemoglobin A1c  6. Vitamin D deficiency  - VITAMIN D 25 Hydroxy   7. Type II diabetes mellitus with neurological manifestations (HCC)  - HM DIABETES FOOT EXAM - LOW EXTREMITY NEUR EXAM DOCUM - Hemoglobin A1c  8. Secondary hyperparathyroidism of renal origin (HCC)  - PTH, intact and calcium  9. Gout, tophaceous  - Uric acid  10. Hypothyroidism  - TSH  11. Seizure disorder (HCC)  - Levetiracetam, Immunoassay  12. OSA on CPAP   13. Gastroesophageal reflux disease  - CBC with Differential/Platelet  14. Screening for colorectal cancer  - POC Hemoccult Bld/Stl 15. Screening for ischemic heart disease  - EKG 12-Lead  16. FHx: heart disease  - EKG 12-Lead  17. Medication management  - Urinalysis, Routine w reflex  microscopic - Microalbumin / creatinine urine ratio - Uric acid - PTH, intact and calcium - CBC with Differential/Platelet - COMPLETE METABOLIC PANEL WITH GFR - Magnesium - Lipid panel - TSH - Hemoglobin A1c - VITAMIN D 25 Hydroxy  - Levetiracetam, Immunoassay        Patient was counseled in prudent diet to achieve/maintain BMI less than 25 for weight control, BP monitoring, regular exercise and medications. Discussed med's effects and SE's. Screening labs and tests as requested with regular follow-up as recommended. Over 40 minutes of exam, counseling, chart review and high complex critical decision making was performed.   Kirtland Bouchard, MD

## 2021-11-06 NOTE — Progress Notes (Signed)
<><><><><><><><><><><><><><><><><><><><><><><><><><><><><><><><><> <><><><><><><><><><><><><><><><><><><><><><><><><><><><><><><><><> -   Test results slightly outside the reference range are not unusual. If there is anything important, I will review this with you,  otherwise it is considered normal test values.  If you have further questions,  please do not hesitate to contact me at the office or via My Chart.  <><><><><><><><><><><><><><><><><><><><><><><><><><><><><><><><><> <><><><><><><><><><><><><><><><><><><><><><><><><><><><><><><><><>  -  PTH Hormone that regulates calcium balance is Normal  <><><><><><><><><><><><><><><><><><><><><><><><><><><><><><><><><> <><><><><><><><><><><><><><><><><><><><><><><><><><><><><><><><><>  -  CBC shows Anemia / Red Cell Count is Improved  <><><><><><><><><><><><><><><><><><><><><><><><><><><><><><><><><> <><><><><><><><><><><><><><><><><><><><><><><><><><><><><><><><><>  -  Kidney Functions are about the same  GFR = 25 is still Stage 4  <><><><><><><><><><><><><><><><><><><><><><><><><><><><><><><><><> <><><><><><><><><><><><><><><><><><><><><><><><><><><><><><><><><>  -  Total Chol = 112  & LDL = 63   - Both   Excellent   - Very low risk for Heart Attack  / Stroke <><><><><><><><><><><><><><><><><><><><><><><><><><><><><><><><><> <><><><><><><><><><><><><><><><><><><><><><><><><><><><><><><><><>  -  A1c = 5.7%  - Excellent <><><><><><><><><><><><><><><><><><><><><><><><><><><><><><><><><> <><><><><><><><><><><><><><><><><><><><><><><><><><><><><><><><><>  -  Uric Acid / Gout test is Normal  <><><><><><><><><><><><><><><><><><><><><><><><><><><><><><><><><> <><><><><><><><><><><><><><><><><><><><><><><><><><><><><><><><><>  -  Vitamin D = 52 - Great <><><><><><><><><><><><><><><><><><><><><><><><><><><><><><><><><> <><><><><><><><><><><><><><><><><><><><><><><><><><><><><><><><><>  -  All Else - CBC - Kidneys - Electrolytes - Liver -  Magnesium & Thyroid    - all  Normal / OK <><><><><><><><><><><><><><><><><><><><><><><><><><><><><><><><><> <><><><><><><><><><><><><><><><><><><><><><><><><><><><><><><><><>  -

## 2021-11-07 LAB — CBC WITH DIFFERENTIAL/PLATELET
Absolute Monocytes: 348 cells/uL (ref 200–950)
Basophils Absolute: 30 cells/uL (ref 0–200)
Basophils Relative: 0.5 %
Eosinophils Absolute: 738 cells/uL — ABNORMAL HIGH (ref 15–500)
Eosinophils Relative: 12.5 %
HCT: 35.2 % (ref 35.0–45.0)
Hemoglobin: 10.7 g/dL — ABNORMAL LOW (ref 11.7–15.5)
Lymphs Abs: 679 cells/uL — ABNORMAL LOW (ref 850–3900)
MCH: 26.7 pg — ABNORMAL LOW (ref 27.0–33.0)
MCHC: 30.4 g/dL — ABNORMAL LOW (ref 32.0–36.0)
MCV: 87.8 fL (ref 80.0–100.0)
MPV: 11.2 fL (ref 7.5–12.5)
Monocytes Relative: 5.9 %
Neutro Abs: 4106 cells/uL (ref 1500–7800)
Neutrophils Relative %: 69.6 %
Platelets: 365 10*3/uL (ref 140–400)
RBC: 4.01 10*6/uL (ref 3.80–5.10)
RDW: 18.3 % — ABNORMAL HIGH (ref 11.0–15.0)
Total Lymphocyte: 11.5 %
WBC: 5.9 10*3/uL (ref 3.8–10.8)

## 2021-11-07 LAB — LIPID PANEL
Cholesterol: 112 mg/dL (ref <200)
HDL: 27 mg/dL — ABNORMAL LOW (ref >=50)
LDL Cholesterol (Calc): 63 mg/dL (calc)
Non-HDL Cholesterol (Calc): 85 mg/dL (calc) (ref <130)
Total CHOL/HDL Ratio: 4.1 (calc) (ref <5.0)
Triglycerides: 141 mg/dL (ref <150)

## 2021-11-07 LAB — PTH, INTACT AND CALCIUM
Calcium: 9.7 mg/dL (ref 8.6–10.4)
PTH: 97 pg/mL — ABNORMAL HIGH (ref 16–77)

## 2021-11-07 LAB — HEMOGLOBIN A1C
Hgb A1c MFr Bld: 5.7 % of total Hgb — ABNORMAL HIGH (ref <5.7)
Mean Plasma Glucose: 117 mg/dL
eAG (mmol/L): 6.5 mmol/L

## 2021-11-07 LAB — COMPLETE METABOLIC PANEL WITH GFR
AG Ratio: 1.7 (calc) (ref 1.0–2.5)
ALT: 6 U/L (ref 6–29)
AST: 12 U/L (ref 10–35)
Albumin: 4.1 g/dL (ref 3.6–5.1)
Alkaline phosphatase (APISO): 61 U/L (ref 37–153)
BUN/Creatinine Ratio: 24 (calc) — ABNORMAL HIGH (ref 6–22)
BUN: 46 mg/dL — ABNORMAL HIGH (ref 7–25)
CO2: 24 mmol/L (ref 20–32)
Calcium: 9.7 mg/dL (ref 8.6–10.4)
Chloride: 110 mmol/L (ref 98–110)
Creat: 1.95 mg/dL — ABNORMAL HIGH (ref 0.60–0.95)
Globulin: 2.4 g/dL (calc) (ref 1.9–3.7)
Glucose, Bld: 192 mg/dL — ABNORMAL HIGH (ref 65–99)
Potassium: 4.3 mmol/L (ref 3.5–5.3)
Sodium: 142 mmol/L (ref 135–146)
Total Bilirubin: 0.5 mg/dL (ref 0.2–1.2)
Total Protein: 6.5 g/dL (ref 6.1–8.1)
eGFR: 25 mL/min/{1.73_m2} — ABNORMAL LOW (ref >=60)

## 2021-11-07 LAB — LEVETIRACETAM, IMMUNOASSAY: LEVETIRACETAM, IMMUNOASSAY: 55.5 ug/mL — ABNORMAL HIGH (ref 6.0–46.0)

## 2021-11-07 LAB — TSH: TSH: 2.39 mIU/L (ref 0.40–4.50)

## 2021-11-07 LAB — MAGNESIUM: Magnesium: 2.1 mg/dL (ref 1.5–2.5)

## 2021-11-07 LAB — URIC ACID: Uric Acid, Serum: 6 mg/dL (ref 2.5–7.0)

## 2021-11-07 LAB — VITAMIN D 25 HYDROXY (VIT D DEFICIENCY, FRACTURES): Vit D, 25-Hydroxy: 52 ng/mL (ref 30–100)

## 2021-11-07 NOTE — Progress Notes (Signed)
<><><><><><><><><><><><><><><><><><><><><><><><><><><><><><><><><>  -   Keppra level finally returned and is a little elevated, So suggest   Decrease from 1 500 mg tablet 2 x /day  to take   1/2 tablet ( 250 mg ) every morning  & 1 whole tablet  ( 500 mg mg ) at night   <><><><><><><><><><><><><><><><><><><><><><><><><><><><><><><><><> <><><><><><><><><><><><><><><><><><><><><><><><><><><><><><><><><>

## 2021-11-09 NOTE — Progress Notes (Signed)
PATIENT IS AWARE OF LAB RESULTS AND INSTRUCTIONS. -E WELCH

## 2021-11-13 LAB — URINALYSIS, ROUTINE W REFLEX MICROSCOPIC
Bilirubin Urine: NEGATIVE
Glucose, UA: NEGATIVE
Hgb urine dipstick: NEGATIVE
Ketones, ur: NEGATIVE
Leukocytes,Ua: NEGATIVE
Nitrite: NEGATIVE
Protein, ur: NEGATIVE
Specific Gravity, Urine: 1.012 (ref 1.001–1.035)
pH: <=5 (ref 5.0–8.0)

## 2021-11-13 LAB — MICROALBUMIN / CREATININE URINE RATIO
Creatinine, Urine: 66 mg/dL (ref 20–275)
Microalb Creat Ratio: 35 mcg/mg creat — ABNORMAL HIGH (ref <30)
Microalb, Ur: 2.3 mg/dL

## 2021-11-13 NOTE — Progress Notes (Signed)
<><><><><><><><><><><><><><><><><><><><><><><><><><><><><><><><><> <><><><><><><><><><><><><><><><><><><><><><><><><><><><><><><><><>  -   U/A looks  OK - No Sign of Infection   <><><><><><><><><><><><><><><><><><><><><><><><><><><><><><><><><> <><><><><><><><><><><><><><><><><><><><><><><><><><><><><><><><><>

## 2021-11-15 ENCOUNTER — Ambulatory Visit: Payer: Medicare Other | Admitting: Orthopedic Surgery

## 2021-11-15 DIAGNOSIS — B351 Tinea unguium: Secondary | ICD-10-CM | POA: Diagnosis not present

## 2021-11-15 DIAGNOSIS — L97411 Non-pressure chronic ulcer of right heel and midfoot limited to breakdown of skin: Secondary | ICD-10-CM

## 2021-11-15 DIAGNOSIS — L97521 Non-pressure chronic ulcer of other part of left foot limited to breakdown of skin: Secondary | ICD-10-CM

## 2021-11-15 DIAGNOSIS — M6701 Short Achilles tendon (acquired), right ankle: Secondary | ICD-10-CM

## 2021-11-22 ENCOUNTER — Telehealth: Payer: Self-pay

## 2021-11-22 NOTE — Telephone Encounter (Signed)
LM-11/22/21-Chart review started. Reviewing OV, Consults, Hospital visits, Labs and medication changes.Chart review completed for upcoming CP focused outreach.  Total time spent: 26 min.

## 2021-11-27 ENCOUNTER — Encounter: Payer: Self-pay | Admitting: Orthopedic Surgery

## 2021-11-27 NOTE — Progress Notes (Signed)
Office Visit Note   Patient: [name] [name]           Date of Birth: 1932/03/23           MRN: [id number] Visit Date: 11/15/2021              Requested by: Unk Pinto, [address] [place],  [place] [address] PCP: Unk Pinto, MD  Chief Complaint  Patient presents with   Right Foot - Follow-up   Left Foot - Follow-up      HPI: Patient is an 88 year old woman who presents for 3 separate issues.  Patient complains of painful onychomycotic nails x6 she is unable to safely trim the nails on her own.  Patient states she has ulcer left foot beneath the great toe as well as a right heel ulcer and bilateral foot pain.  Patient states she spent 2 weeks in the care center after a fall.  Assessment & Plan: Visit Diagnoses:  1. Non-pressure chronic ulcer of right heel and midfoot limited to breakdown of skin ([place])   2. Achilles tendon contracture, right     Plan: Nails were trimmed x6 without complication.  The left foot ulcer was debrided x1 right heel ulcer pared x1.  Follow-Up Instructions: Return in about 4 weeks (around 12/13/2021).   Ortho Exam  Patient is alert, oriented, no adenopathy, well-dressed, normal affect, normal respiratory effort. Examination patient has good dorsalis pedis pulses.  She has a Wagner grade 1 ulcer beneath the first metatarsal head left foot.  After informed consent a 10 blade knife was used to debride the skin and soft tissue back to healthy viable bleeding granulation tissue.  This was touched with silver nitrate.  After debridement the ulcer is 2 cm in diameter 3 mm deep without exposed bone or tendon.  Patient has a hyperkeratotic lesion on the right heel.  The callus was pared x1 with no signs of infection.  Patient has thickened discolored onychomycotic nails x6 and the nails were trimmed x6 without complication.  Imaging: No results found. No images are attached to the encounter.  Labs: Lab Results  Component  Value Date   HGBA1C 5.7 (H) 11/05/2021   HGBA1C 5.6 10/14/2021   HGBA1C 6.1 (H) 05/14/2021   ESRSEDRATE 9 09/29/2019   ESRSEDRATE 0 05/03/2019   ESRSEDRATE 1 05/02/2014   CRP 14.7 (H) 09/29/2019   LABURIC 6.0 11/05/2021   LABURIC 5.5 05/14/2021   LABURIC 8.9 (H) 11/03/2020   REPTSTATUS 10/20/2021 FINAL 10/15/2021   CULT  10/15/2021    NO GROWTH 5 DAYS Performed at [facility], [place] [address], [place], [place] [address]    LABORGA GROUP B STREP(S.AGALACTIAE)ISOLATED 10/13/2021     Lab Results  Component Value Date   ALBUMIN 2.6 (L) 10/18/2021   ALBUMIN 2.7 (L) 10/17/2021   ALBUMIN 2.6 (L) 10/16/2021    Lab Results  Component Value Date   MG 2.1 11/05/2021   MG 2.1 10/19/2021   MG 1.9 10/18/2021   Lab Results  Component Value Date   VD25OH 52 11/05/2021   VD25OH 32 05/14/2021   VD25OH 45 11/03/2020    No results found for: "PREALBUMIN"    Latest Ref Rng & Units 11/05/2021   12:00 AM 10/20/2021    4:55 AM 10/19/2021    5:38 AM  CBC EXTENDED  WBC 3.8 - 10.8 Thousand/uL 5.9  5.3  5.0   RBC 3.80 - 5.10 Million/uL 4.01  3.53  3.62  Hemoglobin 11.7 - 15.5 g/dL 10.7  9.7  9.7   HCT 35.0 - 45.0 % 35.2  32.6  32.7   Platelets 140 - 400 Thousand/uL 365  312  296   NEUT# 1,500 - 7,800 cells/uL 4,106     Lymph# 850 - 3,900 cells/uL 679        There is no height or weight on file to calculate BMI.  Orders:  No orders of the defined types were placed in this encounter.  No orders of the defined types were placed in this encounter.    Procedures: No procedures performed  Clinical Data: No additional findings.  ROS:  All other systems negative, except as noted in the HPI. Review of Systems  Objective: Vital Signs: There were no vitals taken for this visit.  Specialty Comments:  No specialty comments available.  PMFS History: Patient Active Problem List   Diagnosis Date Noted   Bacteremia due to group B Streptococcus 10/18/2021   Acute respiratory  failure with hypoxia ([place]) 10/14/2021   Benign neoplasm of cecum    Benign neoplasm of rectum    Blood in stool    Melena    Acute blood loss anemia    GI bleed 04/04/2020   Symptomatic anemia 11/02/2019   DM neuropathy, type II diabetes mellitus (HCC)    Stage 4 chronic kidney disease (HCC)    Obesity (BMI 30-39.9)    High cholesterol    Aortic valve thickening by Cardiac echo in 2015 11/01/2019   Iron deficiency anemia 05/10/2019   Secondary hyperparathyroidism of renal origin ([place]) 09/20/2018   Type 2 diabetes mellitus with stage 4 chronic kidney disease, with long-term current use of insulin ([place]) 12/08/2017   ACE inhibitor intolerance 12/08/2017   Vertigo of central origin 11/20/2015   Morbid obesity ([place]) BMI 35+ with T2DM, htn, hyperlipidemia, CKD, OSA 12/12/2014   OSA on CPAP 08/08/2014   Type II diabetes mellitus with neurological manifestations ([place]) 09/15/2013   Hyperlipidemia associated with type 2 diabetes mellitus ([place]) 06/27/2013   Vitamin D deficiency 06/27/2013   Seizure disorder ([place]) 11/04/2012   Epilepsy ([place]) 11/04/2012   Anemia 06/13/2008   COLONIC POLYPS 06/09/2008   Hypothyroidism 06/09/2008   CKD stage 4 due to type 2 diabetes mellitus ([place]) 06/09/2008   Venous (peripheral) insufficiency 06/09/2008   Gastroesophageal reflux disease 06/09/2008   Essential hypertension 06/09/2008   Past Medical History:  Diagnosis Date   Anemia, unspecified    Arthritis    "knees; right shoulder" (09/15/2013)   Basal cell carcinoma    "right upper outer lip"   DM neuropathy, type II diabetes mellitus ([place])    Dyspnea 2021   with low iron   GERD (gastroesophageal reflux disease)    Gout    High cholesterol    Hypertension    Hypothyroidism    Meniere's disease    Obesity (BMI 30-39.9)    Other forms of epilepsy and recurrent seizures without mention of intractable epilepsy 11/04/2012   Non convulsive paroxysmal spells, responding to [facility], patient not driving.     Pneumonia ~ 1943   Skin cancer    "forehead; right hand"   Stage 4 chronic kidney disease ([place])    UTI (urinary tract infection)    Vitamin D deficiency     Family History  Problem Relation Age of Onset   Heart disease Mother    Stroke Mother    Heart disease Father    Ovarian cancer Sister    Hypertension  Son    Hypertension Son    Diabetes Son    Alcohol abuse Son     Past Surgical History:  Procedure Laterality Date   BIOPSY  04/05/2020   Procedure: BIOPSY;  Surgeon: Jerene Bears, MD;  Location: [facility];  Service: Gastroenterology;;   CERVICAL POLYPECTOMY     COLONOSCOPY WITH PROPOFOL N/A 04/06/2020   Procedure: COLONOSCOPY WITH PROPOFOL;  Surgeon: Jerene Bears, MD;  Location: [facility];  Service: Gastroenterology;  Laterality: N/A;   ESOPHAGOGASTRODUODENOSCOPY (EGD) WITH PROPOFOL N/A 04/05/2020   Procedure: ESOPHAGOGASTRODUODENOSCOPY (EGD) WITH PROPOFOL;  Surgeon: Jerene Bears, MD;  Location: [facility];  Service: Gastroenterology;  Laterality: N/A;   HAMMER TOE SURGERY Left 1980's   MOHS SURGERY  [address]87   "right upper outer lip"   POLYPECTOMY  04/06/2020   Procedure: POLYPECTOMY;  Surgeon: Jerene Bears, MD;  Location: [facility];  Service: Gastroenterology;;   TONSILLECTOMY  ~ [place] Left ~ 1950   "ran arm thru window"   Social History   Occupational History   Occupation: retired  Tobacco Use   Smoking status: Former    Packs/day: 1.00    Years: 30.00    Total pack years: 30.00    Types: Cigarettes    Quit date: 06/29/1983    Years since quitting: 38.4   Smokeless tobacco: Never  Vaping Use   Vaping Use: Never used  Substance and Sexual Activity   Alcohol use: Yes    Alcohol/week: 0.0 standard drinks of alcohol    Comment: 09/15/2013 "glass of wine maybe once/year"   Drug use: No   Sexual activity: Not Currently

## 2021-11-28 NOTE — Telephone Encounter (Signed)
LM-11/28/21-Called pt. To confirm CP focused outreach on 10/17 at 3:30PM. Pts. Home phone number [phone] came up at a bad number twice. Called pts. Other listed number at [phone] and was unable to reach pt. [place] X1.  Total time spent: 5 min.

## 2021-11-28 NOTE — Telephone Encounter (Signed)
LM-11/28/21-Pt. Returned call and confirmed focused outreach for 10/17. Pt. Confirmed that her home phone number listed is no longer in service and she only uses her cell listed in EMR. Completed precall questions.  Total time spent:  10 min.

## 2021-11-30 NOTE — Telephone Encounter (Signed)
LM-11/30/21-Reviewed pts. LFD for medications for upcoming CP visit.   Total time spent:7 min.

## 2021-12-02 ENCOUNTER — Other Ambulatory Visit: Payer: Self-pay | Admitting: Internal Medicine

## 2021-12-02 DIAGNOSIS — G2581 Restless legs syndrome: Secondary | ICD-10-CM

## 2021-12-04 ENCOUNTER — Ambulatory Visit: Payer: Medicare Other | Admitting: Pharmacy Technician

## 2021-12-04 DIAGNOSIS — E1122 Type 2 diabetes mellitus with diabetic chronic kidney disease: Secondary | ICD-10-CM

## 2021-12-04 DIAGNOSIS — N184 Chronic kidney disease, stage 4 (severe): Secondary | ICD-10-CM

## 2021-12-04 NOTE — Progress Notes (Signed)
CPAP- Not using. She threw away years ago.   BP: Not monitoring at home. No dizziness, headache, falls.   DM: Checking BS 2-3 times daily. Never >150. No lows noted.   Ulcers: She currently has 2 ulcers. 1 on each foot. She sees Dr. Sharol Given regularly. She wears a boot with cushion until they heal.   CKD: 1 cup of coffee. Limits salt. Only takes Tylenol for pain. Trying to push water intake.     She lives alone at [address] >25 years.   She has gotten her flu shot.     CPP Televisit: 64mn

## 2021-12-11 ENCOUNTER — Other Ambulatory Visit: Payer: Self-pay

## 2021-12-11 DIAGNOSIS — Z1211 Encounter for screening for malignant neoplasm of colon: Secondary | ICD-10-CM

## 2021-12-11 DIAGNOSIS — Z1212 Encounter for screening for malignant neoplasm of rectum: Secondary | ICD-10-CM | POA: Diagnosis not present

## 2021-12-11 LAB — POC HEMOCCULT BLD/STL (HOME/3-CARD/SCREEN)
Card #2 Fecal Occult Blod, POC: NEGATIVE
Card #3 Fecal Occult Blood, POC: NEGATIVE
Fecal Occult Blood, POC: NEGATIVE

## 2021-12-13 ENCOUNTER — Encounter: Payer: Self-pay | Admitting: Internal Medicine

## 2021-12-13 ENCOUNTER — Ambulatory Visit: Payer: Medicare Other | Admitting: Orthopedic Surgery

## 2021-12-13 DIAGNOSIS — L97521 Non-pressure chronic ulcer of other part of left foot limited to breakdown of skin: Secondary | ICD-10-CM | POA: Diagnosis not present

## 2021-12-14 ENCOUNTER — Encounter: Payer: Self-pay | Admitting: Orthopedic Surgery

## 2021-12-14 NOTE — Progress Notes (Signed)
Office Visit Note   Patient: [name] [name]           Date of Birth: [birth year]           MRN: [id number] Visit Date: 12/13/2021              Requested by: Unk Pinto, [address],  [place] [id number] PCP: Unk Pinto, MD  Chief Complaint  Patient presents with   Left Foot - Follow-up      HPI: Patient is an 88 year old woman who presents in follow-up for left foot with recurrent Wagner grade 1 ulcer first metatarsal head left foot..  Patient states she has had a small amount of drainage.  Assessment & Plan: Visit Diagnoses:  1. Non-pressure chronic ulcer of other part of left foot limited to breakdown of skin ([address])     Plan: Ulcer debrided to healthy granulation tissue reevaluate in 4 weeks.  Follow-Up Instructions: Return in about 4 weeks (around 01/10/2022).   Ortho Exam  Patient is alert, oriented, no adenopathy, well-dressed, normal affect, normal respiratory effort. Examination there is no redness or cellulitis to the left foot.  There is a Wagner grade 1 ulcer beneath the left great toe metatarsal head.  After informed consent a 10 blade knife was used to debride the skin and soft tissue back to healthy viable granulation tissue.  There is no exposed bone or tendon no tunneling.  The ulcer is 15 mm in diameter after debridement.  Imaging: No results found. No images are attached to the encounter.  Labs: Lab Results  Component Value Date   HGBA1C 5.7 (H) 11/05/2021   HGBA1C 5.6 10/14/2021   HGBA1C 6.1 (H) 05/14/2021   ESRSEDRATE 9 09/29/2019   ESRSEDRATE 0 05/03/2019   ESRSEDRATE 1 05/02/2014   CRP 14.7 (H) 09/29/2019   LABURIC 6.0 11/05/2021   LABURIC 5.5 05/14/2021   LABURIC 8.9 (H) 11/03/2020   REPTSTATUS 10/20/2021 FINAL 10/15/2021   CULT  10/15/2021    NO GROWTH 5 DAYS Performed at [facility], [place] [address] [place], [place] [address]    LABORGA GROUP B STREP(S.AGALACTIAE)ISOLATED 10/13/2021      Lab Results  Component Value Date   ALBUMIN 2.6 (L) 10/18/2021   ALBUMIN 2.7 (L) 10/17/2021   ALBUMIN 2.6 (L) 10/16/2021    Lab Results  Component Value Date   MG 2.1 11/05/2021   MG 2.1 10/19/2021   MG 1.9 10/18/2021   Lab Results  Component Value Date   VD25OH 52 11/05/2021   VD25OH 32 05/14/2021   VD25OH 45 11/03/2020    No results found for: "PREALBUMIN"    Latest Ref Rng & Units 11/05/2021   12:00 AM 10/20/2021    4:55 AM 10/19/2021    5:38 AM  CBC EXTENDED  WBC 3.8 - 10.8 Thousand/uL 5.9  5.3  5.0   RBC 3.80 - 5.10 Million/uL 4.01  3.53  3.62   Hemoglobin 11.7 - 15.5 g/dL 10.7  9.7  9.7   HCT 35.0 - 45.0 % 35.2  32.6  32.7   Platelets 140 - 400 Thousand/uL 365  312  296   NEUT# 1,500 - 7,800 cells/uL 4,106     Lymph# 850 - 3,900 cells/uL 679        There is no height or weight on file to calculate BMI.  Orders:  No orders of the defined types were placed in this encounter.  No orders of the defined types were placed  in this encounter.    Procedures: No procedures performed  Clinical Data: No additional findings.  ROS:  All other systems negative, except as noted in the HPI. Review of Systems  Objective: Vital Signs: There were no vitals taken for this visit.  Specialty Comments:  No specialty comments available.  PMFS History: Patient Active Problem List   Diagnosis Date Noted   Bacteremia due to group B Streptococcus 10/18/2021   Acute respiratory failure with hypoxia ([place]) 10/14/2021   Benign neoplasm of cecum    Benign neoplasm of rectum    Blood in stool    Melena    Acute blood loss anemia    GI bleed 04/04/2020   Symptomatic anemia 11/02/2019   DM neuropathy, type II diabetes mellitus (HCC)    Stage 4 chronic kidney disease (HCC)    Obesity (BMI 30-39.9)    High cholesterol    Aortic valve thickening by Cardiac echo in 2015 11/01/2019   Iron deficiency anemia 05/10/2019   Secondary hyperparathyroidism of renal origin ([place])  09/20/2018   Type 2 diabetes mellitus with stage 4 chronic kidney disease, with long-term current use of insulin ([place]) 12/08/2017   ACE inhibitor intolerance 12/08/2017   Vertigo of central origin 11/20/2015   Morbid obesity ([place]) BMI 35+ with T2DM, htn, hyperlipidemia, CKD, OSA 12/12/2014   OSA on CPAP 08/08/2014   Type II diabetes mellitus with neurological manifestations ([place]) 09/15/2013   Hyperlipidemia associated with type 2 diabetes mellitus ([place]) 06/27/2013   Vitamin D deficiency 06/27/2013   Seizure disorder ([place]) 11/04/2012   Epilepsy ([place]) 11/04/2012   Anemia 06/13/2008   COLONIC POLYPS 06/09/2008   Hypothyroidism 06/09/2008   CKD stage 4 due to type 2 diabetes mellitus ([place]) 06/09/2008   Venous (peripheral) insufficiency 06/09/2008   Gastroesophageal reflux disease 06/09/2008   Essential hypertension 06/09/2008   Past Medical History:  Diagnosis Date   Anemia, unspecified    Arthritis    "knees; right shoulder" (09/15/2013)   Basal cell carcinoma    "right upper outer lip"   DM neuropathy, type II diabetes mellitus ([place])    Dyspnea 2021   with low iron   GERD (gastroesophageal reflux disease)    Gout    High cholesterol    Hypertension    Hypothyroidism    Meniere's disease    Obesity (BMI 30-39.9)    Other forms of epilepsy and recurrent seizures without mention of intractable epilepsy 11/04/2012   Non convulsive paroxysmal spells, responding to [facility], patient not driving.    Pneumonia ~ 1943   Skin cancer    "forehead; right hand"   Stage 4 chronic kidney disease ([place])    UTI (urinary tract infection)    Vitamin D deficiency     Family History  Problem Relation Age of Onset   Heart disease Mother    Stroke Mother    Heart disease Father    Ovarian cancer Sister    Hypertension Son    Hypertension Son    Diabetes Son    Alcohol abuse Son     Past Surgical History:  Procedure Laterality Date   BIOPSY  04/05/2020   Procedure: BIOPSY;  Surgeon:  Jerene Bears, MD;  Location: [facility];  Service: Gastroenterology;;   CERVICAL POLYPECTOMY     COLONOSCOPY WITH PROPOFOL N/A 04/06/2020   Procedure: COLONOSCOPY WITH PROPOFOL;  Surgeon: Jerene Bears, MD;  Location: [facility];  Service: Gastroenterology;  Laterality: N/A;   ESOPHAGOGASTRODUODENOSCOPY (EGD) WITH PROPOFOL N/A 04/05/2020  Procedure: ESOPHAGOGASTRODUODENOSCOPY (EGD) WITH PROPOFOL;  Surgeon: Jerene Bears, MD;  Location: [facility];  Service: Gastroenterology;  Laterality: N/A;   HAMMER TOE SURGERY Left 1980's   MOHS SURGERY  [address]87   "right upper outer lip"   POLYPECTOMY  04/06/2020   Procedure: POLYPECTOMY;  Surgeon: Jerene Bears, MD;  Location: [facility];  Service: Gastroenterology;;   TONSILLECTOMY  ~ [place] Left ~ 1950   "ran arm thru window"   Social History   Occupational History   Occupation: retired  Tobacco Use   Smoking status: Former    Packs/day: 1.00    Years: 30.00    Total pack years: 30.00    Types: Cigarettes    Quit date: 06/29/1983    Years since quitting: 38.4   Smokeless tobacco: Never  Vaping Use   Vaping Use: Never used  Substance and Sexual Activity   Alcohol use: Yes    Alcohol/week: 0.0 standard drinks of alcohol    Comment: 09/15/2013 "glass of wine maybe once/year"   Drug use: No   Sexual activity: Not Currently

## 2021-12-18 DIAGNOSIS — E7800 Pure hypercholesterolemia, unspecified: Secondary | ICD-10-CM | POA: Diagnosis not present

## 2021-12-18 DIAGNOSIS — E1122 Type 2 diabetes mellitus with diabetic chronic kidney disease: Secondary | ICD-10-CM | POA: Diagnosis not present

## 2021-12-18 DIAGNOSIS — N184 Chronic kidney disease, stage 4 (severe): Secondary | ICD-10-CM | POA: Diagnosis not present

## 2022-01-02 ENCOUNTER — Emergency Department (HOSPITAL_COMMUNITY): Payer: Medicare Other

## 2022-01-02 ENCOUNTER — Inpatient Hospital Stay (HOSPITAL_COMMUNITY)
Admission: EM | Admit: 2022-01-02 | Discharge: 2022-01-11 | DRG: 871 | Disposition: A | Discharge status: Skilled Nursing Facility | Payer: Medicare Other | Attending: Family Medicine | Admitting: Family Medicine

## 2022-01-02 ENCOUNTER — Other Ambulatory Visit: Payer: Self-pay

## 2022-01-02 DIAGNOSIS — Z79890 Hormone replacement therapy (postmenopausal): Secondary | ICD-10-CM

## 2022-01-02 DIAGNOSIS — I5033 Acute on chronic diastolic (congestive) heart failure: Secondary | ICD-10-CM | POA: Insufficient documentation

## 2022-01-02 DIAGNOSIS — Z7982 Long term (current) use of aspirin: Secondary | ICD-10-CM

## 2022-01-02 DIAGNOSIS — Z87891 Personal history of nicotine dependence: Secondary | ICD-10-CM

## 2022-01-02 DIAGNOSIS — K219 Gastro-esophageal reflux disease without esophagitis: Secondary | ICD-10-CM | POA: Diagnosis present

## 2022-01-02 DIAGNOSIS — I5032 Chronic diastolic (congestive) heart failure: Secondary | ICD-10-CM | POA: Insufficient documentation

## 2022-01-02 DIAGNOSIS — D471 Chronic myeloproliferative disease: Secondary | ICD-10-CM | POA: Diagnosis present

## 2022-01-02 DIAGNOSIS — D509 Iron deficiency anemia, unspecified: Secondary | ICD-10-CM | POA: Diagnosis present

## 2022-01-02 DIAGNOSIS — Z794 Long term (current) use of insulin: Secondary | ICD-10-CM

## 2022-01-02 DIAGNOSIS — Z6834 Body mass index (BMI) 34.0-34.9, adult: Secondary | ICD-10-CM

## 2022-01-02 DIAGNOSIS — A401 Sepsis due to streptococcus, group B: Principal | ICD-10-CM | POA: Diagnosis present

## 2022-01-02 DIAGNOSIS — R339 Retention of urine, unspecified: Secondary | ICD-10-CM | POA: Diagnosis present

## 2022-01-02 DIAGNOSIS — D638 Anemia in other chronic diseases classified elsewhere: Secondary | ICD-10-CM | POA: Diagnosis present

## 2022-01-02 DIAGNOSIS — I10 Essential (primary) hypertension: Secondary | ICD-10-CM | POA: Diagnosis present

## 2022-01-02 DIAGNOSIS — D631 Anemia in chronic kidney disease: Secondary | ICD-10-CM | POA: Diagnosis present

## 2022-01-02 DIAGNOSIS — W010XXA Fall on same level from slipping, tripping and stumbling without subsequent striking against object, initial encounter: Secondary | ICD-10-CM | POA: Diagnosis present

## 2022-01-02 DIAGNOSIS — E669 Obesity, unspecified: Secondary | ICD-10-CM | POA: Diagnosis present

## 2022-01-02 DIAGNOSIS — E1122 Type 2 diabetes mellitus with diabetic chronic kidney disease: Secondary | ICD-10-CM

## 2022-01-02 DIAGNOSIS — Z66 Do not resuscitate: Secondary | ICD-10-CM | POA: Diagnosis present

## 2022-01-02 DIAGNOSIS — E559 Vitamin D deficiency, unspecified: Secondary | ICD-10-CM | POA: Diagnosis present

## 2022-01-02 DIAGNOSIS — E872 Acidosis, unspecified: Secondary | ICD-10-CM | POA: Diagnosis present

## 2022-01-02 DIAGNOSIS — Z79899 Other long term (current) drug therapy: Secondary | ICD-10-CM

## 2022-01-02 DIAGNOSIS — N184 Chronic kidney disease, stage 4 (severe): Secondary | ICD-10-CM | POA: Diagnosis present

## 2022-01-02 DIAGNOSIS — I951 Orthostatic hypotension: Secondary | ICD-10-CM | POA: Diagnosis present

## 2022-01-02 DIAGNOSIS — Z823 Family history of stroke: Secondary | ICD-10-CM

## 2022-01-02 DIAGNOSIS — E039 Hypothyroidism, unspecified: Secondary | ICD-10-CM | POA: Diagnosis present

## 2022-01-02 DIAGNOSIS — J309 Allergic rhinitis, unspecified: Secondary | ICD-10-CM | POA: Diagnosis present

## 2022-01-02 DIAGNOSIS — Z833 Family history of diabetes mellitus: Secondary | ICD-10-CM

## 2022-01-02 DIAGNOSIS — R6521 Severe sepsis with septic shock: Secondary | ICD-10-CM | POA: Diagnosis present

## 2022-01-02 DIAGNOSIS — E1165 Type 2 diabetes mellitus with hyperglycemia: Secondary | ICD-10-CM | POA: Diagnosis present

## 2022-01-02 DIAGNOSIS — I350 Nonrheumatic aortic (valve) stenosis: Secondary | ICD-10-CM

## 2022-01-02 DIAGNOSIS — A419 Sepsis, unspecified organism: Secondary | ICD-10-CM | POA: Diagnosis present

## 2022-01-02 DIAGNOSIS — J9601 Acute respiratory failure with hypoxia: Secondary | ICD-10-CM | POA: Diagnosis not present

## 2022-01-02 DIAGNOSIS — E7800 Pure hypercholesterolemia, unspecified: Secondary | ICD-10-CM | POA: Diagnosis present

## 2022-01-02 DIAGNOSIS — G2581 Restless legs syndrome: Secondary | ICD-10-CM | POA: Diagnosis present

## 2022-01-02 DIAGNOSIS — R6520 Severe sepsis without septic shock: Secondary | ICD-10-CM | POA: Diagnosis present

## 2022-01-02 DIAGNOSIS — E119 Type 2 diabetes mellitus without complications: Secondary | ICD-10-CM

## 2022-01-02 DIAGNOSIS — N179 Acute kidney failure, unspecified: Secondary | ICD-10-CM | POA: Diagnosis present

## 2022-01-02 DIAGNOSIS — J189 Pneumonia, unspecified organism: Secondary | ICD-10-CM | POA: Diagnosis present

## 2022-01-02 DIAGNOSIS — R531 Weakness: Secondary | ICD-10-CM

## 2022-01-02 DIAGNOSIS — Y92009 Unspecified place in unspecified non-institutional (private) residence as the place of occurrence of the external cause: Secondary | ICD-10-CM

## 2022-01-02 DIAGNOSIS — Z87898 Personal history of other specified conditions: Secondary | ICD-10-CM

## 2022-01-02 DIAGNOSIS — I330 Acute and subacute infective endocarditis: Secondary | ICD-10-CM | POA: Diagnosis present

## 2022-01-02 DIAGNOSIS — R7881 Bacteremia: Secondary | ICD-10-CM

## 2022-01-02 DIAGNOSIS — Z8249 Family history of ischemic heart disease and other diseases of the circulatory system: Secondary | ICD-10-CM

## 2022-01-02 DIAGNOSIS — Z20822 Contact with and (suspected) exposure to covid-19: Secondary | ICD-10-CM | POA: Diagnosis present

## 2022-01-02 DIAGNOSIS — E1140 Type 2 diabetes mellitus with diabetic neuropathy, unspecified: Secondary | ICD-10-CM | POA: Diagnosis present

## 2022-01-02 DIAGNOSIS — I130 Hypertensive heart and chronic kidney disease with heart failure and stage 1 through stage 4 chronic kidney disease, or unspecified chronic kidney disease: Secondary | ICD-10-CM | POA: Diagnosis present

## 2022-01-02 DIAGNOSIS — W19XXXA Unspecified fall, initial encounter: Secondary | ICD-10-CM

## 2022-01-02 DIAGNOSIS — I272 Pulmonary hypertension, unspecified: Secondary | ICD-10-CM | POA: Diagnosis present

## 2022-01-02 DIAGNOSIS — B951 Streptococcus, group B, as the cause of diseases classified elsewhere: Secondary | ICD-10-CM

## 2022-01-02 DIAGNOSIS — G4733 Obstructive sleep apnea (adult) (pediatric): Secondary | ICD-10-CM | POA: Diagnosis present

## 2022-01-02 DIAGNOSIS — Z85828 Personal history of other malignant neoplasm of skin: Secondary | ICD-10-CM

## 2022-01-02 DIAGNOSIS — Z8041 Family history of malignant neoplasm of ovary: Secondary | ICD-10-CM

## 2022-01-02 LAB — CBC WITH DIFFERENTIAL/PLATELET
Abs Immature Granulocytes: 0.05 10*3/uL (ref 0.00–0.07)
Basophils Absolute: 0 10*3/uL (ref 0.0–0.1)
Basophils Relative: 0 %
Eosinophils Absolute: 0.3 10*3/uL (ref 0.0–0.5)
Eosinophils Relative: 3 %
HCT: 32.3 % — ABNORMAL LOW (ref 36.0–46.0)
Hemoglobin: 9.2 g/dL — ABNORMAL LOW (ref 12.0–15.0)
Immature Granulocytes: 0 %
Lymphocytes Relative: 2 %
Lymphs Abs: 0.2 10*3/uL — ABNORMAL LOW (ref 0.7–4.0)
MCH: 25.5 pg — ABNORMAL LOW (ref 26.0–34.0)
MCHC: 28.5 g/dL — ABNORMAL LOW (ref 30.0–36.0)
MCV: 89.5 fL (ref 80.0–100.0)
Monocytes Absolute: 0.6 10*3/uL (ref 0.1–1.0)
Monocytes Relative: 5 %
Neutro Abs: 12 10*3/uL — ABNORMAL HIGH (ref 1.7–7.7)
Neutrophils Relative %: 90 %
Platelets: 330 10*3/uL (ref 150–400)
RBC: 3.61 MIL/uL — ABNORMAL LOW (ref 3.87–5.11)
RDW: 22.1 % — ABNORMAL HIGH (ref 11.5–15.5)
WBC: 13.3 10*3/uL — ABNORMAL HIGH (ref 4.0–10.5)
nRBC: 0 % (ref 0.0–0.2)

## 2022-01-02 LAB — COMPREHENSIVE METABOLIC PANEL
ALT: 20 U/L (ref 0–44)
AST: 32 U/L (ref 15–41)
Albumin: 3.7 g/dL (ref 3.5–5.0)
Alkaline Phosphatase: 56 U/L (ref 38–126)
Anion gap: 10 (ref 5–15)
BUN: 60 mg/dL — ABNORMAL HIGH (ref 8–23)
CO2: 16 mmol/L — ABNORMAL LOW (ref 22–32)
Calcium: 8.7 mg/dL — ABNORMAL LOW (ref 8.9–10.3)
Chloride: 110 mmol/L (ref 98–111)
Creatinine, Ser: 2.72 mg/dL — ABNORMAL HIGH (ref 0.44–1.00)
GFR, Estimated: 16 mL/min — ABNORMAL LOW (ref >60)
Glucose, Bld: 307 mg/dL — ABNORMAL HIGH (ref 70–99)
Potassium: 4.9 mmol/L (ref 3.5–5.1)
Sodium: 136 mmol/L (ref 135–145)
Total Bilirubin: 0.8 mg/dL (ref 0.3–1.2)
Total Protein: 6.4 g/dL — ABNORMAL LOW (ref 6.5–8.1)

## 2022-01-02 LAB — I-STAT CHEM 8, ED
BUN: 64 mg/dL — ABNORMAL HIGH (ref 8–23)
Calcium, Ion: 1.18 mmol/L (ref 1.15–1.40)
Chloride: 109 mmol/L (ref 98–111)
Creatinine, Ser: 2.7 mg/dL — ABNORMAL HIGH (ref 0.44–1.00)
Glucose, Bld: 309 mg/dL — ABNORMAL HIGH (ref 70–99)
HCT: 31 % — ABNORMAL LOW (ref 36.0–46.0)
Hemoglobin: 10.5 g/dL — ABNORMAL LOW (ref 12.0–15.0)
Potassium: 4.6 mmol/L (ref 3.5–5.1)
Sodium: 140 mmol/L (ref 135–145)
TCO2: 17 mmol/L — ABNORMAL LOW (ref 22–32)

## 2022-01-02 LAB — MAGNESIUM: Magnesium: 1.9 mg/dL (ref 1.7–2.4)

## 2022-01-02 MED ORDER — LACTATED RINGERS IV BOLUS (SEPSIS): 1000.0000 mL | Freq: Once | INTRAVENOUS | Status: DC

## 2022-01-02 MED ORDER — SODIUM CHLORIDE 0.9 % IV SOLN
500.0000 mg | Freq: Once | INTRAVENOUS | Status: AC
  Administered 2022-01-03: 500 mg via INTRAVENOUS
  Filled 2022-01-02: qty 5

## 2022-01-02 MED ORDER — SODIUM CHLORIDE 0.9 % IV SOLN
1.0000 g | Freq: Once | INTRAVENOUS | Status: DC
  Administered 2022-01-03: 1 g via INTRAVENOUS
  Filled 2022-01-02: qty 10

## 2022-01-02 NOTE — ED Notes (Signed)
Patient currently in CT °

## 2022-01-02 NOTE — ED Provider Notes (Signed)
[facility] DEPT Provider Note   CSN: [id number] Arrival date & time: 01/02/22  2235     History  Chief Complaint  Patient presents with   Fever    [name] [name] is a 88 y.o. female.  HPI Patient presents by EMS from independent living facility.  EMS was initially called out for a fall.  Patient describes a mechanical fall during which she lost her balance and fell backwards, striking the back of her head.  When EMS arrived on scene, they noted SPO2 of 86% on room air.  Patient is not on home supplemental oxygen.  She was placed on 3 L of supplemental oxygen with improvement of SPO2 to the mid 90s.  Patient describes fevers and chills today.  EMS noted a temperature of 101.4 degrees.  She was given Tylenol shortly prior to arrival.  Patient also describes recent cough and shortness of breath.  She is diabetic and blood glucose prior to arrival was 301.  Currently, patient endorses continued shortness of breath and generalized weakness.    Home Medications Prior to Admission medications   Medication Sig Start Date End Date Taking? Authorizing Provider  acetaminophen (TYLENOL) 500 MG tablet Take 1,000 mg by mouth every 6 (six) hours as needed for moderate pain.    [provider]  allopurinol (ZYLOPRIM) 100 MG tablet Take 1 tablet (100 mg total) by mouth 2 (two) times daily. Take  1 tablet  Daily  to prevent Gout. 02/07/21   Alycia Rossetti, NP  aspirin 81 MG tablet Take 81 mg by mouth daily.    [provider]  atenolol (TENORMIN) 50 MG tablet TAKE ONE TABLET BY MOUTH DAILY GENERIC EQUIVALENT FOR TENORMIN Patient taking differently: Take 50 mg by mouth daily. 06/06/21   Darrol Jump, NP  BD VEO INSULIN SYRINGE U/F 31G X 15/64" 0.3 ML MISC 2 (two) times daily. 06/18/21   [provider]  Blood Glucose Monitoring Suppl (ONETOUCH VERIO) w/Device KIT Check  Blood Sugar   4 x /day before Meals & Bedtime   (( Dx-  e11.22 )) 11/03/20    Unk Pinto, MD  cholecalciferol (VITAMIN D3) 25 MCG (1000 UNIT) tablet Take 3,000 Units by mouth daily.    [provider]  fish oil-omega-3 fatty acids 1000 MG capsule Take 1,000 mg by mouth at bedtime.    [provider]  furosemide (LASIX) 40 MG tablet TAKE 1 TABLET BY MOUTH DAILY FOR FLUID RETENTION AND ANKLE SWELLING Patient taking differently: Take 40 mg by mouth daily. 03/21/21   Alycia Rossetti, NP  glucose blood [facility] VERIO) test strip Check Blood Sugar 4 x /day  (( Dx-  e11.22 )) 11/03/20   Unk Pinto, MD  HUMULIN 70/30 (70-30) 100 UNIT/ML injection INJECT 20 UNITS UNDER THE SKIN TWICE DAILY WITH MEALS Patient taking differently: Inject 20 Units into the skin 2 (two) times daily with a meal. 06/25/21   Alycia Rossetti, NP  hydrocortisone (ANUSOL-HC) 2.5 % rectal cream Place rectally 2 (two) times daily. Patient not taking: Reported on 10/13/2021 04/06/20   Kayleen Memos, DO  Lancets [facility] ULTRASOFT) lancets Check Blood Sugar  4 x /day  before Meals & Bedtime   (( Dx-  e11.22 )) 11/03/20   Unk Pinto, MD  lansoprazole (PREVACID) 30 MG capsule TAKE 1 CAPSULE BY MOUTH DAILY TO PREVENT HEARTBURN AND INDIGESTION. Patient taking differently: Take 30 mg by mouth daily. 12/19/20   Alycia Rossetti, NP  levETIRAcetam (  KEPPRA) 500 MG tablet Take  1 tablet 2  x /day  with Meals  to Prevent Seizures 07/10/21   Unk Pinto, MD  levothyroxine (SYNTHROID) 100 MCG tablet TAKE 1 TABLET BY MOUTH DAILY ON AN EMPTY STOMACH WITH ONLY WATER FOR 30 MINUTES. NO ANTACIDS, CALCIUM OR MAGNESIUM FOR 4 HOURS. AVOID BIOTIN Patient taking differently: Take 100 mcg by mouth daily before breakfast. 11/29/20   Alycia Rossetti, NP  loratadine (CLARITIN) 10 MG tablet Take 10 mg by mouth daily.    [provider]  meclizine (ANTIVERT) 25 MG tablet Take   1 tablet 2 to 3 x /day  ONLY  if needed for Dizziness Bradly Bienenstock 07/10/21   Unk Pinto, MD  rOPINIRole (REQUIP)  1 MG tablet TAKE ONE-HALF TO ONE TABLET BY MOUTH THREE TIMES DAILY AS NEEDED FOR RESTLESS LEGS& CRAMPS 12/03/21   Alycia Rossetti, NP  vitamin B-12 (CYANOCOBALAMIN) 100 MCG tablet Take 500 mcg by mouth daily.    [provider]  vitamin C (ASCORBIC ACID) 500 MG tablet Take 500 mg by mouth daily.    [provider]      Allergies    Ppd [tuberculin purified protein derivative]    Review of Systems   Review of Systems  Constitutional:  Positive for activity change, appetite change, fatigue and fever.  Respiratory:  Positive for cough and shortness of breath.   Neurological:  Positive for weakness (Generalized).  All other systems reviewed and are negative.   Physical Exam Updated Vital Signs BP (!) 136/42   Pulse 72   Temp 98.3 F (36.8 C) (Oral)   Resp (!) 29   SpO2 92%  Physical Exam Vitals and nursing note reviewed.  Constitutional:      General: She is not in acute distress.    Appearance: Normal appearance. She is well-developed. She is not toxic-appearing or diaphoretic.  HENT:     Head: Normocephalic and atraumatic.     Right Ear: External ear normal.     Left Ear: External ear normal.     Nose: Nose normal.     Mouth/Throat:     Mouth: Mucous membranes are moist.     Pharynx: Oropharynx is clear.  Eyes:     Extraocular Movements: Extraocular movements intact.     Conjunctiva/sclera: Conjunctivae normal.  Cardiovascular:     Rate and Rhythm: Normal rate and regular rhythm.     Heart sounds: No murmur heard. Pulmonary:     Effort: Pulmonary effort is normal. No respiratory distress.     Breath sounds: Normal breath sounds. No wheezing or rales.  Chest:     Chest wall: No tenderness.  Abdominal:     General: There is no distension.     Palpations: Abdomen is soft.     Tenderness: There is no abdominal tenderness.  Musculoskeletal:        General: No swelling. Normal range of motion.     Cervical back: Normal range of motion and neck supple.      Right lower leg: No edema.     Left lower leg: No edema.  Skin:    General: Skin is warm and dry.     Capillary Refill: Capillary refill takes less than 2 seconds.     Coloration: Skin is not jaundiced or pale.  Neurological:     General: No focal deficit present.     Mental Status: She is alert and oriented to person, place, and time.  Cranial Nerves: No cranial nerve deficit.     Sensory: No sensory deficit.     Motor: No weakness.     Coordination: Coordination normal.  Psychiatric:        Mood and Affect: Mood normal.        Behavior: Behavior normal.        Thought Content: Thought content normal.        Judgment: Judgment normal.     ED Results / Procedures / Treatments   Labs (all labs ordered are listed, but only abnormal results are displayed) Labs Reviewed  COMPREHENSIVE METABOLIC PANEL - Abnormal; Notable for the following components:      Result Value   CO2 16 (*)    Glucose, Bld 307 (*)    BUN 60 (*)    Creatinine, Ser 2.72 (*)    Calcium 8.7 (*)    Total Protein 6.4 (*)    GFR, Estimated 16 (*)    All other components within normal limits  CBC WITH DIFFERENTIAL/PLATELET - Abnormal; Notable for the following components:   WBC 13.3 (*)    RBC 3.61 (*)    Hemoglobin 9.2 (*)    HCT 32.3 (*)    MCH 25.5 (*)    MCHC 28.5 (*)    RDW 22.1 (*)    Neutro Abs 12.0 (*)    Lymphs Abs 0.2 (*)    All other components within normal limits  I-STAT CHEM 8, ED - Abnormal; Notable for the following components:   BUN 64 (*)    Creatinine, Ser 2.70 (*)    Glucose, Bld 309 (*)    TCO2 17 (*)    Hemoglobin 10.5 (*)    HCT 31.0 (*)    All other components within normal limits  CULTURE, BLOOD (ROUTINE X 2)  CULTURE, BLOOD (ROUTINE X 2)  URINE CULTURE  RESP PANEL BY RT-PCR (FLU A&B, COVID) ARPGX2  MAGNESIUM  LACTIC ACID, PLASMA  LACTIC ACID, PLASMA  PROTIME-INR  URINALYSIS, ROUTINE W REFLEX MICROSCOPIC  BRAIN NATRIURETIC PEPTIDE  TSH    EKG EKG  Interpretation  Date/Time:  Wednesday January 02 2022 22:48:55 EST Ventricular Rate:  74 PR Interval:  144 QRS Duration: 134 QT Interval:  403 QTC Calculation: 448 R Axis:   229 Text Interpretation: Sinus rhythm Right bundle branch block No significant change since last tracing Confirmed by Godfrey Pick [phone]) on 01/02/2022 11:34:08 PM  Radiology CT Head Wo Contrast  Result Date: 01/02/2022 CLINICAL DATA:  Head trauma, minor (Age >= 65y); Neck trauma (Age >= 65y) EXAM: CT HEAD WITHOUT CONTRAST CT CERVICAL SPINE WITHOUT CONTRAST TECHNIQUE: Multidetector CT imaging of the head and cervical spine was performed following the standard protocol without intravenous contrast. Multiplanar CT image reconstructions of the cervical spine were also generated. RADIATION DOSE REDUCTION: This exam was performed according to the departmental dose-optimization program which includes automated exposure control, adjustment of the mA and/or kV according to patient size and/or use of iterative reconstruction technique. COMPARISON:  CT head and cervical spine 08/02/2021 FINDINGS: CT HEAD FINDINGS Brain: No evidence of large-territorial acute infarction. No parenchymal hemorrhage. No mass lesion. No extra-axial collection. No mass effect or midline shift. No hydrocephalus. Basilar cisterns are patent. Vascular: No hyperdense vessel. Atherosclerotic calcifications are present within the cavernous internal carotid arteries. Skull: No acute fracture or focal lesion. Sinuses/Orbits: Left maxillary sinus mucosal thickening. Otherwise paranasal sinuses and mastoid air cells are clear. Bilateral lens replacement. Otherwise the orbits are unremarkable. Other: None. CT  CERVICAL SPINE FINDINGS Alignment: Stable grade 1 anterolisthesis of C5 on C6. Stable mild retrolisthesis of C3 on C4. Skull base and vertebrae: Multilevel mild degenerative changes spine . No severe osseous central canal stenosis or neural foraminal stenosis. No acute  fracture. No aggressive appearing focal osseous lesion or focal pathologic process. Soft tissues and spinal canal: No prevertebral fluid or swelling. No visible canal hematoma. Upper chest: Unremarkable. Other: None. IMPRESSION: 1. No acute intracranial abnormality. 2. No acute displaced fracture or traumatic listhesis of the cervical spine. Electronically Signed   By: Iven Finn M.D.   On: 01/02/2022 23:52   CT Cervical Spine Wo Contrast  Result Date: 01/02/2022 CLINICAL DATA:  Head trauma, minor (Age >= 65y); Neck trauma (Age >= 65y) EXAM: CT HEAD WITHOUT CONTRAST CT CERVICAL SPINE WITHOUT CONTRAST TECHNIQUE: Multidetector CT imaging of the head and cervical spine was performed following the standard protocol without intravenous contrast. Multiplanar CT image reconstructions of the cervical spine were also generated. RADIATION DOSE REDUCTION: This exam was performed according to the departmental dose-optimization program which includes automated exposure control, adjustment of the mA and/or kV according to patient size and/or use of iterative reconstruction technique. COMPARISON:  CT head and cervical spine 08/02/2021 FINDINGS: CT HEAD FINDINGS Brain: No evidence of large-territorial acute infarction. No parenchymal hemorrhage. No mass lesion. No extra-axial collection. No mass effect or midline shift. No hydrocephalus. Basilar cisterns are patent. Vascular: No hyperdense vessel. Atherosclerotic calcifications are present within the cavernous internal carotid arteries. Skull: No acute fracture or focal lesion. Sinuses/Orbits: Left maxillary sinus mucosal thickening. Otherwise paranasal sinuses and mastoid air cells are clear. Bilateral lens replacement. Otherwise the orbits are unremarkable. Other: None. CT CERVICAL SPINE FINDINGS Alignment: Stable grade 1 anterolisthesis of C5 on C6. Stable mild retrolisthesis of C3 on C4. Skull base and vertebrae: Multilevel mild degenerative changes spine . No severe  osseous central canal stenosis or neural foraminal stenosis. No acute fracture. No aggressive appearing focal osseous lesion or focal pathologic process. Soft tissues and spinal canal: No prevertebral fluid or swelling. No visible canal hematoma. Upper chest: Unremarkable. Other: None. IMPRESSION: 1. No acute intracranial abnormality. 2. No acute displaced fracture or traumatic listhesis of the cervical spine. Electronically Signed   By: Iven Finn M.D.   On: 01/02/2022 23:52   DG Chest Port 1 View  Result Date: 01/02/2022 CLINICAL DATA:  Questionable sepsis.  Evaluate abnormality. EXAM: PORTABLE CHEST 1 VIEW COMPARISON:  Chest radiograph dated October 16, 2021 FINDINGS: The heart is enlarged. Atherosclerotic calcification of the aortic arch. Biapical pleural/parenchymal scarring. Bibasilar atelectasis or infiltrate. No pleural effusion or pneumothorax. No acute osseous abnormality. IMPRESSION: 1. Bibasilar atelectasis or infiltrate. 2. Cardiomegaly. Electronically Signed   By: Keane Police D.O.   On: 01/02/2022 23:21    Procedures Procedures    Medications Ordered in ED Medications  lactated ringers bolus 1,000 mL (has no administration in time range)  cefTRIAXone (ROCEPHIN) 1 g in sodium chloride 0.9 % 100 mL IVPB (has no administration in time range)  azithromycin (ZITHROMAX) 500 mg in sodium chloride 0.9 % 250 mL IVPB (has no administration in time range)    ED Course/ Medical Decision Making/ A&P                           Medical Decision Making Amount and/or Complexity of Data Reviewed Labs: ordered. Radiology: ordered. ECG/medicine tests: ordered.   This patient presents to the ED for concern of  shortness of breath and cough, this involves an extensive number of treatment options, and is a complaint that carries with it a high risk of complications and morbidity.  The differential diagnosis includes pneumonia, PE, CHF, dehydration, anemia, metabolic derangements   Co  morbidities that complicate the patient evaluation  Hypothyroidism, CKD, anemia, GERD, HTN, seizures, HLD, OSA, DM, anemia,   Additional history obtained:  Additional history obtained from EMS External records from outside source obtained and reviewed including EMR   Lab Tests:  I Ordered, and personally interpreted labs.  The pertinent results include: Increased creatinine from baseline with elevated BUN suggestive of prerenal etiology, hyperglycemia without evidence of DKA.  Baseline anemia.  A leukocytosis is present.   Imaging Studies ordered:  I ordered imaging studies including chest x-ray, CT imaging of head, cervical spine, and chest I independently visualized and interpreted imaging which showed bibasilar opacities on chest x-ray, CT of head and cervical spine showed no acute injuries.  CT of chest pending at time of admission. I agree with the radiologist interpretation   Cardiac Monitoring: / EKG:  The patient was maintained on a cardiac monitor.  I personally viewed and interpreted the cardiac monitored which showed an underlying rhythm of: Sinus rhythm  Problem List / ED Course / Critical interventions / Medication management  Patient presents by EMS from [facility] for initial concerns of a fall.  Fall is described as mechanical.  Patient struck the back of her head.  She did not lose consciousness.  Skin is intact and there is small hematoma present.  More notably, patient was found to be hypoxic and febrile.  She is not on home oxygen.  EMS reported SPO2 of 86% on room air at scene.  SPO2 improved on 3 L of supplemental oxygen.  On arrival, patient is tachypneic.  She was febrile with EMS but did receive Tylenol prior to arrival.  She is normothermic on arrival.  Patient was placed on bedside cardiac monitor.  IV fluids were initiated.  Septic work-up was initiated.  Patient also undergo CT imaging of head and cervical spine given her recent fall.  Chest x-ray shows areas of  bibasilar opacity.  Patient was treated empirically for pneumonia.  Lab work shows leukocytosis and AKI.  BUN is elevated consistent with prerenal etiology.  Patient remained hemodynamically stable.  SPO2 remained normal on 3 L of supplemental oxygen.  Patient was admitted to hospitalist for further management. I ordered medication including IV fluids for dehydration and antibiotics for pneumonia Reevaluation of the patient after these medicines showed that the patient improved I have reviewed the patients home medicines and have made adjustments as needed   Social Determinants of Health:  Lives independently  CRITICAL CARE Performed by: Godfrey Pick   Total critical care time: 35 minutes  Critical care time was exclusive of separately billable procedures and treating other patients.  Critical care was necessary to treat or prevent imminent or life-threatening deterioration.  Critical care was time spent personally by me on the following activities: development of treatment plan with patient and/or surrogate as well as nursing, discussions with consultants, evaluation of patient's response to treatment, examination of patient, obtaining history from patient or surrogate, ordering and performing treatments and interventions, ordering and review of laboratory studies, ordering and review of radiographic studies, pulse oximetry and re-evaluation of patient's condition.        Final Clinical Impression(s) / ED Diagnoses Final diagnoses:  Pneumonia of both lower lobes due to infectious organism  Acute respiratory failure with hypoxia [facility])    Rx / DC Orders ED Discharge Orders     None         Godfrey Pick, MD 01/03/22 0000

## 2022-01-02 NOTE — ED Triage Notes (Signed)
Patient BIB EMS for evaluation of fever and SHOB.  EMS reports they were called to patient's residence due to fall without LOC.  On arrival, patient c/o SHOB.  Found to have fever and medicated with Tylenol.  RA sats were 86%.  Placed on 3L [place] and sats improved.  Reports having "new RSV vaccine on Monday."

## 2022-01-03 ENCOUNTER — Inpatient Hospital Stay (HOSPITAL_COMMUNITY): Payer: Medicare Other

## 2022-01-03 ENCOUNTER — Other Ambulatory Visit: Payer: Self-pay

## 2022-01-03 ENCOUNTER — Inpatient Hospital Stay: Payer: Self-pay

## 2022-01-03 ENCOUNTER — Encounter (HOSPITAL_COMMUNITY): Payer: Self-pay | Admitting: Internal Medicine

## 2022-01-03 DIAGNOSIS — D471 Chronic myeloproliferative disease: Secondary | ICD-10-CM

## 2022-01-03 DIAGNOSIS — Z79899 Other long term (current) drug therapy: Secondary | ICD-10-CM | POA: Diagnosis not present

## 2022-01-03 DIAGNOSIS — Z87898 Personal history of other specified conditions: Secondary | ICD-10-CM

## 2022-01-03 DIAGNOSIS — E872 Acidosis, unspecified: Secondary | ICD-10-CM | POA: Diagnosis present

## 2022-01-03 DIAGNOSIS — E1122 Type 2 diabetes mellitus with diabetic chronic kidney disease: Secondary | ICD-10-CM | POA: Diagnosis present

## 2022-01-03 DIAGNOSIS — A419 Sepsis, unspecified organism: Secondary | ICD-10-CM

## 2022-01-03 DIAGNOSIS — J189 Pneumonia, unspecified organism: Secondary | ICD-10-CM | POA: Diagnosis not present

## 2022-01-03 DIAGNOSIS — D638 Anemia in other chronic diseases classified elsewhere: Secondary | ICD-10-CM | POA: Diagnosis not present

## 2022-01-03 DIAGNOSIS — Z794 Long term (current) use of insulin: Secondary | ICD-10-CM

## 2022-01-03 DIAGNOSIS — E669 Obesity, unspecified: Secondary | ICD-10-CM | POA: Diagnosis present

## 2022-01-03 DIAGNOSIS — R6520 Severe sepsis without septic shock: Secondary | ICD-10-CM

## 2022-01-03 DIAGNOSIS — J9601 Acute respiratory failure with hypoxia: Secondary | ICD-10-CM | POA: Diagnosis present

## 2022-01-03 DIAGNOSIS — N184 Chronic kidney disease, stage 4 (severe): Secondary | ICD-10-CM | POA: Diagnosis present

## 2022-01-03 DIAGNOSIS — I350 Nonrheumatic aortic (valve) stenosis: Secondary | ICD-10-CM

## 2022-01-03 DIAGNOSIS — E7800 Pure hypercholesterolemia, unspecified: Secondary | ICD-10-CM | POA: Diagnosis present

## 2022-01-03 DIAGNOSIS — I272 Pulmonary hypertension, unspecified: Secondary | ICD-10-CM | POA: Diagnosis present

## 2022-01-03 DIAGNOSIS — I330 Acute and subacute infective endocarditis: Secondary | ICD-10-CM | POA: Diagnosis present

## 2022-01-03 DIAGNOSIS — R7881 Bacteremia: Secondary | ICD-10-CM

## 2022-01-03 DIAGNOSIS — Y92009 Unspecified place in unspecified non-institutional (private) residence as the place of occurrence of the external cause: Secondary | ICD-10-CM | POA: Diagnosis not present

## 2022-01-03 DIAGNOSIS — Z20822 Contact with and (suspected) exposure to covid-19: Secondary | ICD-10-CM | POA: Diagnosis present

## 2022-01-03 DIAGNOSIS — E119 Type 2 diabetes mellitus without complications: Secondary | ICD-10-CM

## 2022-01-03 DIAGNOSIS — J309 Allergic rhinitis, unspecified: Secondary | ICD-10-CM | POA: Diagnosis present

## 2022-01-03 DIAGNOSIS — E1165 Type 2 diabetes mellitus with hyperglycemia: Secondary | ICD-10-CM | POA: Diagnosis present

## 2022-01-03 DIAGNOSIS — W010XXA Fall on same level from slipping, tripping and stumbling without subsequent striking against object, initial encounter: Secondary | ICD-10-CM | POA: Diagnosis present

## 2022-01-03 DIAGNOSIS — Z66 Do not resuscitate: Secondary | ICD-10-CM | POA: Diagnosis present

## 2022-01-03 DIAGNOSIS — R531 Weakness: Secondary | ICD-10-CM

## 2022-01-03 DIAGNOSIS — I130 Hypertensive heart and chronic kidney disease with heart failure and stage 1 through stage 4 chronic kidney disease, or unspecified chronic kidney disease: Secondary | ICD-10-CM | POA: Diagnosis present

## 2022-01-03 DIAGNOSIS — B955 Unspecified streptococcus as the cause of diseases classified elsewhere: Secondary | ICD-10-CM

## 2022-01-03 DIAGNOSIS — A401 Sepsis due to streptococcus, group B: Secondary | ICD-10-CM | POA: Diagnosis present

## 2022-01-03 DIAGNOSIS — N179 Acute kidney failure, unspecified: Secondary | ICD-10-CM | POA: Diagnosis present

## 2022-01-03 DIAGNOSIS — I5033 Acute on chronic diastolic (congestive) heart failure: Secondary | ICD-10-CM | POA: Insufficient documentation

## 2022-01-03 DIAGNOSIS — E039 Hypothyroidism, unspecified: Secondary | ICD-10-CM

## 2022-01-03 DIAGNOSIS — W19XXXA Unspecified fall, initial encounter: Secondary | ICD-10-CM

## 2022-01-03 DIAGNOSIS — I951 Orthostatic hypotension: Secondary | ICD-10-CM | POA: Diagnosis present

## 2022-01-03 DIAGNOSIS — D631 Anemia in chronic kidney disease: Secondary | ICD-10-CM | POA: Diagnosis present

## 2022-01-03 DIAGNOSIS — R6521 Severe sepsis with septic shock: Secondary | ICD-10-CM

## 2022-01-03 DIAGNOSIS — I5032 Chronic diastolic (congestive) heart failure: Secondary | ICD-10-CM | POA: Diagnosis present

## 2022-01-03 DIAGNOSIS — G2581 Restless legs syndrome: Secondary | ICD-10-CM | POA: Diagnosis present

## 2022-01-03 DIAGNOSIS — I10 Essential (primary) hypertension: Secondary | ICD-10-CM

## 2022-01-03 DIAGNOSIS — E1140 Type 2 diabetes mellitus with diabetic neuropathy, unspecified: Secondary | ICD-10-CM | POA: Diagnosis present

## 2022-01-03 HISTORY — DX: Pneumonia, unspecified organism: J18.9

## 2022-01-03 HISTORY — DX: Sepsis, unspecified organism: R65.20

## 2022-01-03 LAB — BLOOD CULTURE ID PANEL (REFLEXED) - BCID2

## 2022-01-03 LAB — RESPIRATORY PANEL BY PCR

## 2022-01-03 LAB — LACTIC ACID, PLASMA
Lactic Acid, Venous: 3.3 mmol/L (ref 0.5–1.9)
Lactic Acid, Venous: 2.3 mmol/L (ref 0.5–1.9)

## 2022-01-03 LAB — GLUCOSE, CAPILLARY
Glucose-Capillary: 171 mg/dL — ABNORMAL HIGH (ref 70–99)
Glucose-Capillary: 292 mg/dL — ABNORMAL HIGH (ref 70–99)
Glucose-Capillary: 139 mg/dL — ABNORMAL HIGH (ref 70–99)
Glucose-Capillary: 127 mg/dL — ABNORMAL HIGH (ref 70–99)
Glucose-Capillary: 200 mg/dL — ABNORMAL HIGH (ref 70–99)

## 2022-01-03 LAB — TSH: TSH: 4.142 u[IU]/mL (ref 0.350–4.500)

## 2022-01-03 LAB — CBC WITH DIFFERENTIAL/PLATELET
Abs Immature Granulocytes: 0.29 10*3/uL — ABNORMAL HIGH (ref 0.00–0.07)
Basophils Absolute: 0 10*3/uL (ref 0.0–0.1)
Basophils Relative: 0 %
Eosinophils Absolute: 0.1 10*3/uL (ref 0.0–0.5)
Eosinophils Relative: 1 %
HCT: 27.7 % — ABNORMAL LOW (ref 36.0–46.0)
Hemoglobin: 8 g/dL — ABNORMAL LOW (ref 12.0–15.0)
Immature Granulocytes: 2 %
Lymphocytes Relative: 2 %
Lymphs Abs: 0.3 10*3/uL — ABNORMAL LOW (ref 0.7–4.0)
MCH: 25.5 pg — ABNORMAL LOW (ref 26.0–34.0)
MCHC: 28.9 g/dL — ABNORMAL LOW (ref 30.0–36.0)
MCV: 88.2 fL (ref 80.0–100.0)
Monocytes Absolute: 0.8 10*3/uL (ref 0.1–1.0)
Monocytes Relative: 6 %
Neutro Abs: 12.2 10*3/uL — ABNORMAL HIGH (ref 1.7–7.7)
Neutrophils Relative %: 89 %
Platelets: 290 10*3/uL (ref 150–400)
RBC: 3.14 MIL/uL — ABNORMAL LOW (ref 3.87–5.11)
RDW: 21.7 % — ABNORMAL HIGH (ref 11.5–15.5)
WBC: 13.8 10*3/uL — ABNORMAL HIGH (ref 4.0–10.5)
nRBC: 0.1 % (ref 0.0–0.2)

## 2022-01-03 LAB — PROCALCITONIN
Procalcitonin: 3.34 ng/mL
Procalcitonin: 4.34 ng/mL

## 2022-01-03 LAB — URINALYSIS, ROUTINE W REFLEX MICROSCOPIC
Bacteria, UA: NONE SEEN
Bilirubin Urine: NEGATIVE
Glucose, UA: NEGATIVE mg/dL
Hgb urine dipstick: NEGATIVE
Ketones, ur: NEGATIVE mg/dL
Leukocytes,Ua: NEGATIVE
Nitrite: NEGATIVE
Protein, ur: 30 mg/dL — AB
Specific Gravity, Urine: 1.014 (ref 1.005–1.030)
pH: 5 (ref 5.0–8.0)

## 2022-01-03 LAB — COMPREHENSIVE METABOLIC PANEL
ALT: 21 U/L (ref 0–44)
AST: 22 U/L (ref 15–41)
Albumin: 3.4 g/dL — ABNORMAL LOW (ref 3.5–5.0)
Alkaline Phosphatase: 50 U/L (ref 38–126)
Anion gap: 9 (ref 5–15)
BUN: 73 mg/dL — ABNORMAL HIGH (ref 8–23)
CO2: 18 mmol/L — ABNORMAL LOW (ref 22–32)
Calcium: 8.8 mg/dL — ABNORMAL LOW (ref 8.9–10.3)
Chloride: 111 mmol/L (ref 98–111)
Creatinine, Ser: 2.9 mg/dL — ABNORMAL HIGH (ref 0.44–1.00)
GFR, Estimated: 15 mL/min — ABNORMAL LOW (ref >60)
Glucose, Bld: 197 mg/dL — ABNORMAL HIGH (ref 70–99)
Potassium: 4.1 mmol/L (ref 3.5–5.1)
Sodium: 138 mmol/L (ref 135–145)
Total Bilirubin: 1.1 mg/dL (ref 0.3–1.2)
Total Protein: 5.8 g/dL — ABNORMAL LOW (ref 6.5–8.1)

## 2022-01-03 LAB — MAGNESIUM: Magnesium: 2 mg/dL (ref 1.7–2.4)

## 2022-01-03 LAB — CBC
HCT: 28.1 % — ABNORMAL LOW (ref 36.0–46.0)
Hemoglobin: 8.1 g/dL — ABNORMAL LOW (ref 12.0–15.0)
MCH: 25.5 pg — ABNORMAL LOW (ref 26.0–34.0)
MCHC: 28.8 g/dL — ABNORMAL LOW (ref 30.0–36.0)
MCV: 88.4 fL (ref 80.0–100.0)
Platelets: 246 10*3/uL (ref 150–400)
RBC: 3.18 MIL/uL — ABNORMAL LOW (ref 3.87–5.11)
RDW: 21.8 % — ABNORMAL HIGH (ref 11.5–15.5)
WBC: 12.1 10*3/uL — ABNORMAL HIGH (ref 4.0–10.5)
nRBC: 0 % (ref 0.0–0.2)

## 2022-01-03 LAB — BLOOD GAS, VENOUS
Acid-base deficit: 5.3 mmol/L — ABNORMAL HIGH (ref 0.0–2.0)
Bicarbonate: 19.1 mmol/L — ABNORMAL LOW (ref 20.0–28.0)
O2 Saturation: 69.4 %
Patient temperature: 37
pCO2, Ven: 33 mmHg — ABNORMAL LOW (ref 44–60)
pH, Ven: 7.37 (ref 7.25–7.43)
pO2, Ven: 40 mmHg (ref 32–45)

## 2022-01-03 LAB — IRON AND TIBC
Iron: 16 ug/dL — ABNORMAL LOW (ref 28–170)
Saturation Ratios: 6 % — ABNORMAL LOW (ref 10.4–31.8)
TIBC: 283 ug/dL (ref 250–450)
UIBC: 267 ug/dL

## 2022-01-03 LAB — STREP PNEUMONIAE URINARY ANTIGEN: Strep Pneumo Urinary Antigen: NEGATIVE

## 2022-01-03 LAB — PROTIME-INR
INR: 1.5 — ABNORMAL HIGH (ref 0.8–1.2)
INR: 1.7 — ABNORMAL HIGH (ref 0.8–1.2)
Prothrombin Time: 17.6 seconds — ABNORMAL HIGH (ref 11.4–15.2)
Prothrombin Time: 19.4 seconds — ABNORMAL HIGH (ref 11.4–15.2)

## 2022-01-03 LAB — APTT: aPTT: 36 seconds (ref 24–36)

## 2022-01-03 LAB — PHOSPHORUS: Phosphorus: 4 mg/dL (ref 2.5–4.6)

## 2022-01-03 LAB — TYPE AND SCREEN
ABO/RH(D): AB POS
Antibody Screen: NEGATIVE

## 2022-01-03 LAB — FERRITIN: Ferritin: 27 ng/mL (ref 11–307)

## 2022-01-03 LAB — RESP PANEL BY RT-PCR (FLU A&B, COVID) ARPGX2
Influenza A by PCR: NEGATIVE
Influenza B by PCR: NEGATIVE
SARS Coronavirus 2 by RT PCR: NEGATIVE

## 2022-01-03 LAB — MRSA NEXT GEN BY PCR, NASAL: MRSA by PCR Next Gen: DETECTED — AB

## 2022-01-03 LAB — BRAIN NATRIURETIC PEPTIDE: B Natriuretic Peptide: 1268.9 pg/mL — ABNORMAL HIGH (ref 0.0–100.0)

## 2022-01-03 MED ORDER — SODIUM CHLORIDE 0.9 % IV SOLN: 500.0000 mg | INTRAVENOUS | Status: DC

## 2022-01-03 MED ORDER — MUPIROCIN 2 % EX OINT
1.0000 | TOPICAL_OINTMENT | Freq: Two times a day (BID) | CUTANEOUS | Status: AC
  Administered 2022-01-03 – 2022-01-07 (×10): 1 via NASAL
  Filled 2022-01-03 (×3): qty 22

## 2022-01-03 MED ORDER — LORATADINE 10 MG PO TABS
10.0000 mg | ORAL_TABLET | Freq: Every day | ORAL | Status: DC
  Administered 2022-01-03 – 2022-01-11 (×9): 10 mg via ORAL
  Filled 2022-01-03 (×9): qty 1

## 2022-01-03 MED ORDER — CHLORHEXIDINE GLUCONATE CLOTH 2 % EX PADS
6.0000 | MEDICATED_PAD | Freq: Every day | CUTANEOUS | Status: DC
  Administered 2022-01-03: 6 via TOPICAL

## 2022-01-03 MED ORDER — ORAL CARE MOUTH RINSE
15.0000 mL | OROMUCOSAL | Status: DC
  Administered 2022-01-03 – 2022-01-11 (×20): 15 mL via OROMUCOSAL

## 2022-01-03 MED ORDER — CHLORHEXIDINE GLUCONATE CLOTH 2 % EX PADS
6.0000 | MEDICATED_PAD | Freq: Every day | CUTANEOUS | Status: DC
  Administered 2022-01-03 – 2022-01-11 (×7): 6 via TOPICAL

## 2022-01-03 MED ORDER — PHENTOLAMINE MESYLATE 5 MG IJ SOLR
5.0000 mg | Freq: Once | INTRAMUSCULAR | Status: AC
  Administered 2022-01-03: 5 mg via SUBCUTANEOUS
  Filled 2022-01-03: qty 5

## 2022-01-03 MED ORDER — LACTATED RINGERS IV BOLUS
500.0000 mL | Freq: Once | INTRAVENOUS | Status: AC
  Administered 2022-01-03: 500 mL via INTRAVENOUS

## 2022-01-03 MED ORDER — LEVOTHYROXINE SODIUM 100 MCG PO TABS
100.0000 ug | ORAL_TABLET | Freq: Every day | ORAL | Status: DC
  Administered 2022-01-03 – 2022-01-11 (×9): 100 ug via ORAL
  Filled 2022-01-03 (×9): qty 1

## 2022-01-03 MED ORDER — NITROGLYCERIN 2 % TD OINT
1.0000 [in_us] | TOPICAL_OINTMENT | Freq: Three times a day (TID) | TRANSDERMAL | Status: AC
  Administered 2022-01-03 – 2022-01-05 (×6): 1 [in_us] via TOPICAL
  Filled 2022-01-03: qty 30

## 2022-01-03 MED ORDER — SODIUM CHLORIDE 0.9 % IV BOLUS
250.0000 mL | Freq: Once | INTRAVENOUS | Status: AC
  Administered 2022-01-03: 250 mL via INTRAVENOUS

## 2022-01-03 MED ORDER — CEFAZOLIN SODIUM-DEXTROSE 2-4 GM/100ML-% IV SOLN
2.0000 g | Freq: Two times a day (BID) | INTRAVENOUS | Status: DC
  Administered 2022-01-03 – 2022-01-08 (×11): 2 g via INTRAVENOUS
  Filled 2022-01-03 (×12): qty 100

## 2022-01-03 MED ORDER — SODIUM CHLORIDE 0.9 % IV SOLN
2.0000 g | INTRAVENOUS | Status: DC
  Administered 2022-01-03: 2 g via INTRAVENOUS
  Filled 2022-01-03: qty 12.5

## 2022-01-03 MED ORDER — ROPINIROLE HCL 0.25 MG PO TABS
0.5000 mg | ORAL_TABLET | Freq: Once | ORAL | Status: AC
  Administered 2022-01-03: 0.5 mg via ORAL
  Filled 2022-01-03: qty 2

## 2022-01-03 MED ORDER — INSULIN DETEMIR 100 UNIT/ML ~~LOC~~ SOLN
8.0000 [IU] | Freq: Two times a day (BID) | SUBCUTANEOUS | Status: DC
  Administered 2022-01-03 – 2022-01-11 (×17): 8 [IU] via SUBCUTANEOUS
  Filled 2022-01-03 (×22): qty 0.08

## 2022-01-03 MED ORDER — MUPIROCIN 2 % EX OINT: 1.0000 | TOPICAL_OINTMENT | Freq: Two times a day (BID) | CUTANEOUS | Status: DC

## 2022-01-03 MED ORDER — VANCOMYCIN HCL IN DEXTROSE 1-5 GM/200ML-% IV SOLN
1000.0000 mg | Freq: Once | INTRAVENOUS | Status: AC
  Administered 2022-01-03: 1000 mg via INTRAVENOUS
  Filled 2022-01-03: qty 200

## 2022-01-03 MED ORDER — ACETAMINOPHEN 325 MG PO TABS
650.0000 mg | ORAL_TABLET | Freq: Four times a day (QID) | ORAL | Status: DC | PRN
  Administered 2022-01-03 – 2022-01-11 (×12): 650 mg via ORAL
  Filled 2022-01-03 (×12): qty 2

## 2022-01-03 MED ORDER — INSULIN ASPART 100 UNIT/ML IJ SOLN
0.0000 [IU] | Freq: Three times a day (TID) | INTRAMUSCULAR | Status: DC
  Administered 2022-01-03: 2 [IU] via SUBCUTANEOUS
  Administered 2022-01-03: 1 [IU] via SUBCUTANEOUS
  Administered 2022-01-03: 5 [IU] via SUBCUTANEOUS
  Administered 2022-01-04 (×2): 1 [IU] via SUBCUTANEOUS
  Administered 2022-01-04 – 2022-01-05 (×3): 2 [IU] via SUBCUTANEOUS
  Administered 2022-01-05 – 2022-01-06 (×2): 1 [IU] via SUBCUTANEOUS
  Administered 2022-01-06: 3 [IU] via SUBCUTANEOUS
  Administered 2022-01-07 (×2): 1 [IU] via SUBCUTANEOUS
  Administered 2022-01-07: 5 [IU] via SUBCUTANEOUS
  Administered 2022-01-08: 2 [IU] via SUBCUTANEOUS
  Administered 2022-01-08 – 2022-01-09 (×2): 1 [IU] via SUBCUTANEOUS
  Administered 2022-01-09: 3 [IU] via SUBCUTANEOUS
  Administered 2022-01-10: 2 [IU] via SUBCUTANEOUS
  Administered 2022-01-10: 3 [IU] via SUBCUTANEOUS
  Administered 2022-01-10 – 2022-01-11 (×2): 1 [IU] via SUBCUTANEOUS
  Administered 2022-01-11: 3 [IU] via SUBCUTANEOUS
  Filled 2022-01-03: qty 0.09

## 2022-01-03 MED ORDER — ASPIRIN 81 MG PO CHEW
81.0000 mg | CHEWABLE_TABLET | Freq: Every day | ORAL | Status: DC
  Administered 2022-01-03 – 2022-01-11 (×9): 81 mg via ORAL
  Filled 2022-01-03 (×9): qty 1

## 2022-01-03 MED ORDER — SODIUM CHLORIDE 0.9 % IV SOLN
250.0000 mL | INTRAVENOUS | Status: DC
  Administered 2022-01-03: 250 mL via INTRAVENOUS

## 2022-01-03 MED ORDER — LEVETIRACETAM 250 MG PO TABS
250.0000 mg | ORAL_TABLET | Freq: Every day | ORAL | Status: DC
  Administered 2022-01-03 – 2022-01-11 (×9): 250 mg via ORAL
  Filled 2022-01-03 (×9): qty 1

## 2022-01-03 MED ORDER — MIDODRINE HCL 5 MG PO TABS
10.0000 mg | ORAL_TABLET | Freq: Three times a day (TID) | ORAL | Status: DC
  Administered 2022-01-03 – 2022-01-11 (×23): 10 mg via ORAL
  Filled 2022-01-03 (×23): qty 2

## 2022-01-03 MED ORDER — ORAL CARE MOUTH RINSE: 15.0000 mL | OROMUCOSAL | Status: DC | PRN

## 2022-01-03 MED ORDER — NOREPINEPHRINE 4 MG/250ML-% IV SOLN
2.0000 ug/min | INTRAVENOUS | Status: DC
  Administered 2022-01-03: 4 ug/min via INTRAVENOUS
  Administered 2022-01-03: 2 ug/min via INTRAVENOUS
  Administered 2022-01-03: 5 ug/min via INTRAVENOUS
  Administered 2022-01-04: 4 ug/min via INTRAVENOUS
  Filled 2022-01-03 (×4): qty 250

## 2022-01-03 MED ORDER — LEVETIRACETAM 500 MG PO TABS
500.0000 mg | ORAL_TABLET | Freq: Every day | ORAL | Status: DC
  Administered 2022-01-03 – 2022-01-10 (×9): 500 mg via ORAL
  Filled 2022-01-03 (×9): qty 1

## 2022-01-03 MED ORDER — ACETAMINOPHEN 650 MG RE SUPP: 650.0000 mg | Freq: Four times a day (QID) | RECTAL | Status: DC | PRN

## 2022-01-03 MED ORDER — VANCOMYCIN HCL 500 MG/100ML IV SOLN: 500.0000 mg | INTRAVENOUS | Status: DC

## 2022-01-03 NOTE — Progress Notes (Addendum)
Norepi infiltrated in [name] [name]. Discussed with RN to follow protocol-- withdraw as much fluid from [name] as possible, phentolamine. Film/video editor.  I discussed with her that she needs pressors to continue- she already has an acute renal injury and poor baseline renal function, so hypotension will likely worsen this. When I introduced the idea of a cental line with IJ placement. She is resistant to this. We discussed her previous discussions with her nephrologist-- she would not pursue HD if her renal disease progressed. Paged PICC team for possible PICC line placement since this seems more suitable to [name] [name]. Otherwise we will need to further discuss her need for CVC or risk of other PIVs extravasating or progressive renal injury if she is hypotensive.  Julian Hy, DO 01/03/22 6:21 PM [facility]    PICC team not available. [name] [name] consented for CVC placement after discussion of risk, benefits, alternatives. Risk of progressive renal failure due to hypotension was of most concern to her.  Placing foley for urinary retention and AKI today. Has had minimal UOP. BNP elevated, but with renal failure it is warranted to try fluid bolus in this case.  500cc LR bolus Foley  CXR ordered to confirm line placement.  Julian Hy, DO 01/03/22 7:21 PM  Pulmonary & Critical Care

## 2022-01-03 NOTE — Progress Notes (Signed)
PHARMACY - PHYSICIAN COMMUNICATION CRITICAL VALUE ALERT - BLOOD CULTURE IDENTIFICATION (BCID)  [name] [name] is an 88 y.o. female who presented to [facility] on 01/02/2022 with a chief complaint of mechanical fall, SOB, LE/abdominal swelling.    Assessment: Initial concern for sepsis (UTI or pneumonia) but now low concern for pneumonia.  Urine cultures are pending.  Concern for unilateral LE cellulitis. Note history of Strep bacteremia in 09/2021.  BCx 2/4 bottles GPC chains BCID:  Streptococcus agalactiae  Name of physician (or Provider) Contacted: Dr Carlis Abbott  Current antibiotics: Cefepime, Vancomycin   Changes to prescribed antibiotics recommended:  Recommendations accepted by provider De-escalate to Cefazolin 2g IV q12h   Results for orders placed or performed during the hospital encounter of 01/02/22  Blood Culture ID Panel (Reflexed) (Collected: 01/02/2022 11:15 PM)  Result Value Ref Range   Enterococcus faecalis NOT DETECTED NOT DETECTED   Enterococcus Faecium NOT DETECTED NOT DETECTED   Listeria monocytogenes NOT DETECTED NOT DETECTED   Staphylococcus species NOT DETECTED NOT DETECTED   Staphylococcus aureus (BCID) NOT DETECTED NOT DETECTED   Staphylococcus epidermidis NOT DETECTED NOT DETECTED   Staphylococcus lugdunensis NOT DETECTED NOT DETECTED   Streptococcus species DETECTED (A) NOT DETECTED   Streptococcus agalactiae DETECTED (A) NOT DETECTED   Streptococcus pneumoniae NOT DETECTED NOT DETECTED   Streptococcus pyogenes NOT DETECTED NOT DETECTED   A.calcoaceticus-baumannii NOT DETECTED NOT DETECTED   Bacteroides fragilis NOT DETECTED NOT DETECTED   Enterobacterales NOT DETECTED NOT DETECTED   Enterobacter cloacae complex NOT DETECTED NOT DETECTED   Escherichia coli NOT DETECTED NOT DETECTED   Klebsiella aerogenes NOT DETECTED NOT DETECTED   Klebsiella oxytoca NOT DETECTED NOT DETECTED   Klebsiella pneumoniae NOT DETECTED NOT DETECTED   Proteus species NOT  DETECTED NOT DETECTED   Salmonella species NOT DETECTED NOT DETECTED   Serratia marcescens NOT DETECTED NOT DETECTED   Haemophilus influenzae NOT DETECTED NOT DETECTED   Neisseria meningitidis NOT DETECTED NOT DETECTED   Pseudomonas aeruginosa NOT DETECTED NOT DETECTED   Stenotrophomonas maltophilia NOT DETECTED NOT DETECTED   Candida albicans NOT DETECTED NOT DETECTED   Candida auris NOT DETECTED NOT DETECTED   Candida glabrata NOT DETECTED NOT DETECTED   Candida krusei NOT DETECTED NOT DETECTED   Candida parapsilosis NOT DETECTED NOT DETECTED   Candida tropicalis NOT DETECTED NOT DETECTED   Cryptococcus neoformans/gattii NOT DETECTED NOT DETECTED    Gretta Arab PharmD, BCPS [facility] main pharmacy [phone] 01/03/2022 2:01 PM

## 2022-01-03 NOTE — Progress Notes (Signed)
PHARMACY - PHYSICIAN COMMUNICATION CRITICAL VALUE ALERT - BLOOD CULTURE IDENTIFICATION (BCID)  [name] [name] is an 88 y.o. female who presented to [facility] with a chief complaint of   Assessment: Strep bacteremia now in 4/4 bottles.  Name of physician (or Provider) Contacted: Noemi Chapel  Current antibiotics: Ancef  Changes to prescribed antibiotics recommended:  Patient is on recommended antibiotics - No changes needed  Results for orders placed or performed during the hospital encounter of 01/02/22  Blood Culture ID Panel (Reflexed) (Collected: 01/02/2022 11:15 PM)  Result Value Ref Range   Enterococcus faecalis NOT DETECTED NOT DETECTED   Enterococcus Faecium NOT DETECTED NOT DETECTED   Listeria monocytogenes NOT DETECTED NOT DETECTED   Staphylococcus species NOT DETECTED NOT DETECTED   Staphylococcus aureus (BCID) NOT DETECTED NOT DETECTED   Staphylococcus epidermidis NOT DETECTED NOT DETECTED   Staphylococcus lugdunensis NOT DETECTED NOT DETECTED   Streptococcus species DETECTED (A) NOT DETECTED   Streptococcus agalactiae DETECTED (A) NOT DETECTED   Streptococcus pneumoniae NOT DETECTED NOT DETECTED   Streptococcus pyogenes NOT DETECTED NOT DETECTED   A.calcoaceticus-baumannii NOT DETECTED NOT DETECTED   Bacteroides fragilis NOT DETECTED NOT DETECTED   Enterobacterales NOT DETECTED NOT DETECTED   Enterobacter cloacae complex NOT DETECTED NOT DETECTED   Escherichia coli NOT DETECTED NOT DETECTED   Klebsiella aerogenes NOT DETECTED NOT DETECTED   Klebsiella oxytoca NOT DETECTED NOT DETECTED   Klebsiella pneumoniae NOT DETECTED NOT DETECTED   Proteus species NOT DETECTED NOT DETECTED   Salmonella species NOT DETECTED NOT DETECTED   Serratia marcescens NOT DETECTED NOT DETECTED   Haemophilus influenzae NOT DETECTED NOT DETECTED   Neisseria meningitidis NOT DETECTED NOT DETECTED   Pseudomonas aeruginosa NOT DETECTED NOT DETECTED   Stenotrophomonas  maltophilia NOT DETECTED NOT DETECTED   Candida albicans NOT DETECTED NOT DETECTED   Candida auris NOT DETECTED NOT DETECTED   Candida glabrata NOT DETECTED NOT DETECTED   Candida krusei NOT DETECTED NOT DETECTED   Candida parapsilosis NOT DETECTED NOT DETECTED   Candida tropicalis NOT DETECTED NOT DETECTED   Cryptococcus neoformans/gattii NOT DETECTED NOT DETECTED    Kamran Coker S. Alford Highland, PharmD, BCPS Clinical Staff Pharmacist [email or web address]  Wayland Salinas 01/03/2022  4:01 PM

## 2022-01-03 NOTE — Progress Notes (Addendum)
2/4 bottles (1 site) positive for strep. Concern for recurrent strep bacteremia. Likely source may be skin cellulitis on left shin.   D/w pharmacy-- deescalate antibiotics to cefazolin. Repeat echo-- worry about her stenoticAV and endocarditis risk ID consult paged  [name] Hy, DO 01/03/22 2:46 PM [facility]

## 2022-01-03 NOTE — Progress Notes (Signed)
ACP Documentation:  Per my discussions with the patient today, she has expressed her wishes to be DNR.  She would not want chest compressions, antiarrhythmics, electrical shocks, or intubation in the setting of cardiac arrest.  However, patient also conveys that she would be amenable to intubation in a non-cardiac arrest scernario in which there is primary pulmonary indication for such.    Babs Bertin, DO Hospitalist

## 2022-01-03 NOTE — Consult Note (Signed)
NAME:  JAVIER MAMONE, MRN:  [id number], DOB:  07/29/1932, LOS: 0 ADMISSION DATE:  01/02/2022, CONSULTATION DATE:  11/16 REFERRING MD:  Ronita Hipps, CHIEF COMPLAINT:  hypotension   History of Present Illness:  Mrs. Tabares is an 88 year old woman with a history of CKD 4, HFpEF who presented to the hospital after mechanical fall.  She endorses having a nonproductive cough and shortness of breath for few days.  She has been having leg edema and abdominal distention recently.  She has chronic hypotension causing dizziness when she stands up.  She believes her fall was due to standing up too quickly, causing her to fall.  She continues to take atenolol despite chronic postural hypotension.  She has not checked her blood pressure at home recently due to her cuff not working, but reports she chronically has a low blood pressure.  She denies recent dysuria or urinary frequency.  Right now she is trying to the bathroom is unable to.  After being brought to the ED she was found to be febrile with a leukocytosis.  She was empirically started on azithromycin, ceftriaxone, vancomycin due to concern for pneumonia.  Overnight her blood pressure dropped and she was brought to the intensive care unit for vasopressors.  Pertinent  Medical History  HFpEF CKD 4 Postural hypotension Moderate AS PH DM  Significant Hospital Events: Including procedures, antibiotic start and stop dates in addition to other pertinent events   11/15 admitted for sepsis, fall 11/16 transferred to ICU for hypotension  Interim History / Subjective:    Objective   Blood pressure (!) 80/31, pulse 77, temperature 98 F (36.7 C), temperature source Oral, resp. rate (!) 24, height _0  (1.575 m), weight 83.4 kg, SpO2 97 %.        Intake/Output Summary (Last 24 hours) at 01/03/2022 0708 Last data filed at 01/03/2022 1438 Gross per 24 hour  Intake 799.47 ml  Output --  Net 799.47 ml   Filed Weights   01/03/22 0500 01/03/22  0700  Weight: 83.5 kg 83.4 kg    Examination: General: Elderly woman sitting up in bed in no acute distress HENT: [place]/AT, eyes anicteric Lungs: Breathing comfortably on nasal cannula, bibasilar rales.  No observed coughing.  No accessory muscle use or conversational dyspnea. Cardiovascular: S1-S2, regular rate and rhythm.  Systolic ejection murmur. Abdomen: Soft, nontender Extremities: Mild bilateral lower extremity edema.  Mild erythema of left shin and ankle Neuro: Awake and alert, answering questions appropriately, moving all extremities. Derm: Warm, dry, no diffuse rashes.  Few skin wounds on her feet, but do not appear infected  CT chest personally reviewed-no significant infiltrates.  Has bibasilar dependent pleural effusions with atelectasis overlying.  Small hilar adenopathy bilaterally.  Lactic acid 3.3> 2.3 BNP one 268.9 BUN 73 Creatinine 2.9 Procalcitonin 3.34 WBC 13.8 H/H 8/27.7 Flu, COVID-negative  Resolved Hospital Problem list     Assessment & Plan:  Septic shock-does not appear to have pneumonia on CT scan.  With feeling of urinary retention, concern for UTI.  Area of erythema on her left shin also raises concern for possible cellulitis. Lactic acidosis - Collect UA and urine culture straight cath to obtain samples. - Continue empiric antibiotics. Recommend stopping azithromycin due to low concern for pneumonia. - Follow blood cultures - Norepinephrine as required to maintain MAP greater than 65.  Her blood pressure cuff is not fitting correctly and likely she will need to be switched to a wrist cuff due to upper arm anatomy.  AKI  on CKD 4-follows with Dr. Royce Macadamia as an outpatient - Strict I's/O - Renally dose meds and avoid nephrotoxic meds - Maintain adequate renal perfusion - Not ready for diuresis.  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 - Monitor on telemetry - Monitor electrolytes  Chronic dizziness, mechanical falls -Likely not a good candidate to go home on atenolol -Needs follow-up echocardiogram as an outpatient follow-up with cardiology  Diabetes with hyperglycemia - Continue insulin detemir plus sliding scale insulin  History of JAK2 positive myeloproliferative disorder-under surveillance Iron deficiency anemia - Previously has been recommended by hematology to avoid iron supplements- see 07/04/21 note from Dr. Irene Limbo -Transfuse for hemoglobin less than 7 or hemodynamically significant bleeding. -Continue daily baby aspirin  History of hypothyroidism - Synthroid  Best Practice (right click and "Reselect all SmartList Selections" daily)   Per primary  Labs   CBC: Recent Labs  Lab 01/02/22 2315 01/02/22 2344 01/03/22 0519  WBC 13.3*  --  13.8*  NEUTROABS 12.0*  --  12.2*  HGB 9.2* 10.5* 8.0*  HCT 32.3* 31.0* 27.7*  MCV 89.5  --  88.2  PLT 330  --  597    Basic Metabolic Panel: Recent Labs  Lab 01/02/22 2315 01/02/22 2344 01/03/22 0519  NA 136 140 138  K 4.9 4.6 4.1  CL 110 109 111  CO2 16*  --  18*  GLUCOSE 307* 309* 197*  BUN 60* 64* 73*  CREATININE 2.72* 2.70* 2.90*  CALCIUM 8.7*  --  8.8*  MG 1.9  --  2.0  PHOS  --   --  4.0   GFR: Estimated Creatinine Clearance: 13.7 mL/min (A) (by C-G formula based on SCr of 2.9 mg/dL (H)). Recent Labs  Lab 01/02/22 2315 01/03/22 0519  PROCALCITON  --  3.34  WBC 13.3* 13.8*  LATICACIDVEN 3.3* 2.3*    Liver Function Tests: Recent Labs  Lab 01/02/22 2315 01/03/22 0519  AST 32 22  ALT 20 21  ALKPHOS 56 50  BILITOT 0.8 1.1  PROT 6.4* 5.8*  ALBUMIN 3.7 3.4*   No results for input(s): "LIPASE", "AMYLASE" in the last 168 hours. No results for input(s): "AMMONIA" in the last 168 hours.  ABG    Component Value Date/Time   HCO3 19.1 (L) 01/03/2022 0519   TCO2 17 (L) 01/02/2022 2344   ACIDBASEDEF 5.3 (H)  01/03/2022 0519   O2SAT 69.4 01/03/2022 0519     Coagulation Profile: Recent Labs  Lab 01/02/22 2315 01/03/22 0519  INR 1.5* 1.7*    Cardiac Enzymes: No results for input(s): "CKTOTAL", "CKMB", "CKMBINDEX", "TROPONINI" in the last 168 hours.  HbA1C: Hgb A1c MFr Bld  Date/Time Value Ref Range Status  11/05/2021 12:00 AM 5.7 (H) <5.7 % of total Hgb Final    Comment:    For someone without known diabetes, a hemoglobin  A1c value between 5.7% and 6.4% is consistent with prediabetes and should be confirmed with a  follow-up test. . For someone with known diabetes, a value <7% indicates that their diabetes is well controlled. A1c targets should be individualized based on duration of diabetes, age, comorbid conditions, and other considerations. . This assay result is consistent with an increased risk of diabetes. . Currently, no consensus exists regarding use of hemoglobin A1c for diagnosis of diabetes for children. Marland Kitchen   10/14/2021 08:53 AM 5.6 4.8 - 5.6 % Final    Comment:    (  NOTE) Pre diabetes:          5.7%-6.4%  Diabetes:              >6.4%  Glycemic control for   <7.0% adults with diabetes     CBG: No results for input(s): "GLUCAP" in the last 168 hours.  Review of Systems:   Review of Systems  Constitutional:  Positive for chills and fever.  HENT:  Negative for congestion.   Respiratory:  Positive for cough and shortness of breath. Negative for sputum production.   Cardiovascular:  Positive for leg swelling. Negative for chest pain.  Gastrointestinal:  Negative for nausea.  Genitourinary:  Negative for dysuria and frequency.       Urinary retention  Musculoskeletal:  Negative for myalgias.  Skin:  Negative for rash.  Neurological:  Positive for dizziness. Negative for focal weakness.     Past Medical History:  She,  has a past medical history of Anemia, unspecified, Arthritis, Basal cell carcinoma, DM neuropathy, type II diabetes mellitus ([place]),  Dyspnea (2021), GERD (gastroesophageal reflux disease), Gout, High cholesterol, Hypertension, Hypothyroidism, Meniere's disease, Obesity (BMI 30-39.9), Other forms of epilepsy and recurrent seizures without mention of intractable epilepsy (11/04/2012), Pneumonia (~ 1943), Skin cancer, Stage 4 chronic kidney disease ([place]), UTI (urinary tract infection), and Vitamin D deficiency.   Surgical History:   Past Surgical History:  Procedure Laterality Date   BIOPSY  04/05/2020   Procedure: BIOPSY;  Surgeon: Jerene Bears, MD;  Location: [facility];  Service: Gastroenterology;;   CERVICAL POLYPECTOMY     COLONOSCOPY WITH PROPOFOL N/A 04/06/2020   Procedure: COLONOSCOPY WITH PROPOFOL;  Surgeon: Jerene Bears, MD;  Location: [facility];  Service: Gastroenterology;  Laterality: N/A;   ESOPHAGOGASTRODUODENOSCOPY (EGD) WITH PROPOFOL N/A 04/05/2020   Procedure: ESOPHAGOGASTRODUODENOSCOPY (EGD) WITH PROPOFOL;  Surgeon: Jerene Bears, MD;  Location: [facility];  Service: Gastroenterology;  Laterality: N/A;   HAMMER TOE SURGERY Left 1980's   MOHS SURGERY  20087   "right upper outer lip"   POLYPECTOMY  04/06/2020   Procedure: POLYPECTOMY;  Surgeon: Jerene Bears, MD;  Location: [facility];  Service: Gastroenterology;;   TONSILLECTOMY  ~ Winter Left ~ 1950   "ran arm thru window"     Social History:   reports that she quit smoking about 38 years ago. Her smoking use included cigarettes. She has a 30.00 pack-year smoking history. She has never used smokeless tobacco. She reports current alcohol use. She reports that she does not use drugs.   Family History:  Her family history includes Alcohol abuse in her son; Diabetes in her son; Heart disease in her father and mother; Hypertension in her son and son; Ovarian cancer in her sister; Stroke in her mother.   Allergies Allergies  Allergen Reactions   Ppd [Tuberculin Purified Protein Derivative] Other (See Comments)    indurated     Home  Medications  Prior to Admission medications   Medication Sig Start Date End Date Taking? Authorizing Provider  acetaminophen (TYLENOL) 500 MG tablet Take 1,000 mg by mouth every 6 (six) hours as needed for moderate pain.   Yes [provider]  allopurinol (ZYLOPRIM) 100 MG tablet Take 1 tablet (100 mg total) by mouth 2 (two) times daily. Take  1 tablet  Daily  to prevent Gout. 02/07/21  Yes Alycia Rossetti, NP  aspirin 81 MG tablet Take 81 mg by mouth daily.   Yes [provider]  atenolol (TENORMIN)  50 MG tablet TAKE ONE TABLET BY MOUTH DAILY GENERIC EQUIVALENT FOR TENORMIN Patient taking differently: Take 50 mg by mouth daily. 06/06/21  Yes Cranford, Kenney Houseman, NP  cholecalciferol (VITAMIN D3) 25 MCG (1000 UNIT) tablet Take 3,000 Units by mouth daily.   Yes [provider]  fish oil-omega-3 fatty acids 1000 MG capsule Take 1,000 mg by mouth at bedtime.   Yes [provider]  furosemide (LASIX) 40 MG tablet TAKE 1 TABLET BY MOUTH DAILY FOR FLUID RETENTION AND ANKLE SWELLING Patient taking differently: Take 40 mg by mouth daily. 03/21/21  Yes Alycia Rossetti, NP  HUMULIN 70/30 (70-30) 100 UNIT/ML injection INJECT 20 UNITS UNDER THE SKIN TWICE DAILY WITH MEALS Patient taking differently: Inject 20 Units into the skin 2 (two) times daily with a meal. 06/25/21  Yes Alycia Rossetti, NP  lansoprazole (PREVACID) 30 MG capsule TAKE 1 CAPSULE BY MOUTH DAILY TO PREVENT HEARTBURN AND INDIGESTION. Patient taking differently: Take 30 mg by mouth daily. 12/19/20  Yes Alycia Rossetti, NP  levETIRAcetam (KEPPRA) 500 MG tablet Take  1 tablet 2  x /day  with Meals  to Prevent Seizures Patient taking differently: 250 mg in the morning and 57m in the evening 07/10/21  Yes MUnk Pinto MD  levothyroxine (SYNTHROID) 100 MCG tablet TAKE 1 TABLET BY MOUTH DAILY ON AN EMPTY STOMACH WITH ONLY WATER FOR 30 MINUTES. NO ANTACIDS, CALCIUM OR MAGNESIUM FOR 4 HOURS. AVOID BIOTIN Patient  taking differently: Take 100 mcg by mouth daily before breakfast. 11/29/20  Yes WAlycia Rossetti NP  loratadine (CLARITIN) 10 MG tablet Take 10 mg by mouth daily.   Yes [provider]  meclizine (ANTIVERT) 25 MG tablet Take   1 tablet 2 to 3 x /day  ONLY  if needed for Dizziness /Vertigo 07/10/21  Yes MUnk Pinto MD  rOPINIRole (REQUIP) 1 MG tablet TAKE ONE-HALF TO ONE TABLET BY MOUTH THREE TIMES DAILY AS NEEDED FOR RESTLESS LEGS& CRAMPS 12/03/21  Yes WAlycia Rossetti NP  vitamin B-12 (CYANOCOBALAMIN) 100 MCG tablet Take 500 mcg by mouth daily.   Yes [provider]  vitamin C (ASCORBIC ACID) 500 MG tablet Take 500 mg by mouth daily.   Yes [provider]  BD VEO INSULIN SYRINGE U/F 31G X 15/64" 0.3 ML MISC 2 (two) times daily. 06/18/21   [provider]  Blood Glucose Monitoring Suppl (ONETOUCH VERIO) w/Device KIT Check  Blood Sugar   4 x /day before Meals & Bedtime   (( Dx-  e11.22 )) 11/03/20   MUnk Pinto MD  glucose blood ([facility]) test strip Check Blood Sugar 4 x /day  (( Dx-  e11.22 )) 11/03/20   MUnk Pinto MD  hydrocortisone (ANUSOL-HC) 2.5 % rectal cream Place rectally 2 (two) times daily. Patient not taking: Reported on 10/13/2021 04/06/20   HKayleen Memos DO  Lancets ([facility]) lancets Check Blood Sugar  4 x /day  before Meals & Bedtime   (( Dx-  e11.22 )) 11/03/20   MUnk Pinto MD     Critical care time: 50 min.     LJulian Hy DO 01/03/22 8:37 AM [facility]

## 2022-01-03 NOTE — Progress Notes (Signed)
PT Cancellation Note  Patient Details Name: [name] [name] MRN: [id number] DOB: 30-Apr-1932   Cancelled Treatment:    Reason Eval/Treat Not Completed: Patient not medically ready,  will follow for medical stability. [place] Office [phone] Weekend [phone]    Claretha Cooper 01/03/2022, 8:55 AM

## 2022-01-03 NOTE — Procedures (Signed)
Central Venous Catheter Insertion Procedure Note  [name] [name]  [id number]  1932-03-03  Date:01/03/22  Time:7:18 PM   Provider Performing:Rielynn Trulson Naomie Dean   Procedure: Insertion of Non-tunneled Central Venous [phone]) with US guidance (20601)   Indication(s) Medication administration and Difficult access  Consent Risks of the procedure as well as the alternatives and risks of each were explained to the patient and/or caregiver.  Consent for the procedure was obtained and is signed in the bedside chart  Anesthesia Topical only with 1% lidocaine   Timeout Verified patient identification, verified procedure, site/side was marked, verified correct patient position, special equipment/implants available, medications/allergies/relevant history reviewed, required imaging and test results available.  Sterile Technique Maximal sterile technique including full sterile barrier drape, hand hygiene, sterile gown, sterile gloves, mask, hair covering, sterile ultrasound probe cover (if used).  Procedure Description Area of catheter insertion was cleaned with chlorhexidine and draped in sterile fashion.  With real-time ultrasound guidance a central venous catheter was placed into the right internal jugular vein. Wire confirmed in compressible vein prior to dilation of vessel. Nonpulsatile blood flow and easy flushing noted in all ports.  The catheter was sutured in place and sterile dressing applied.    Complications/Tolerance None; patient tolerated the procedure well. Chest X-ray is ordered to verify placement for internal jugular or subclavian cannulation.   Chest x-ray is not ordered for femoral cannulation.  EBL Minimal  Specimen(s) None  Julian Hy, DO 01/03/22 7:18 PM [facility]

## 2022-01-03 NOTE — Progress Notes (Signed)
Pt Iwatch alarmed. Assess L FA where Levo was going, infiltrated. Dr, Carlis Abbott notified. Phentolamine Mesylate given & nitro cream placed on arm. Pt stable. Continue to assess arm. Margin marked on arm where swelling is

## 2022-01-03 NOTE — Progress Notes (Addendum)
CROSS COVER NOTE  NAME: [name] [name] MRN: [id number] DOB : [birth year]    Date of Service   01/03/2022   HPI/Events of Note   Notified by bedside RN of hypotensive episode 80/ 40's with MAP ~40s.  Patient has a history of stage IV CKD, chronic diastolic heart failure with LVEF 60 to 65%, and moderate aortic stenosis.  Patient presented to the ED complaining of SOB and was admitted with suspected severe sepsis in the setting of pneumonia. SIRS criteria met with leukocytosis, tachypnea, lactic acid 3.3.    In the ED patient had soft BP.  Bedside RN states that 1 L IVF bolus was started in the ED and was abruptly stopped due to patient's increase SOB. Increase in SOB possibly from volume overload in the setting of heart failure.  Over the past couple days, patient has noted slight increase in bilateral lower extremity edema, left greater than right.  Current BNP 1268.9.   During bedside assessment patient is alert and oriented.  She appears weak and pale.  Mild crackles at base, but patient does not complain of any SOB.  She denies chest pain, nausea, vomiting, palpitation.  +2 edema in left lower extremity. Overall, pt does not appear to be in any distress.   CT chest without contrast shows small layering pleural effusion with adjacent compressive atelectasis, small pericardial effusion, mild interstitial edema.   Chest CT  IMPRESSION: 1. Cardiomegaly with small pericardial effusion. 2. Upper-normal caliber pulmonary arteries. 3. Aortic and coronary artery atherosclerosis. 4. Small layering pleural effusions with adjacent compressive atelectasis. 5. Bilateral lower lobe bronchial thickening with scattered impacted bronchioles. No focal pneumonia is seen. 6. Interlobular septal thickening in the lung bases probably due to mild interstitial edema. Question mild CHF or fluid overload but no venous distention is evident. 7. Chronic changes in the right middle lobe and lateral  right lower lobe base. 8. Mildly prominent mediastinal lymph nodes, increased in size from 2015. 9. Splenomegaly, mildly increased from 2015. 10. Small hiatal hernia. 11. Osteopenia, degenerative change and kyphosis of the thoracic spine. No acute or significant osseous findings. 12. Bilateral renal cortical thinning.  Additionally, patient does have a history of recent septic shock that occurred while hospitalized in the [facility] system from 10/14/21 to 10/20/21 for  streptococcal bacteremia. she was treated with IV antibiotics and vasopressors.    Patient to be transferred to ICU due to hemodynamic instability.  Orders have been made to start patient on Levophed.  PCCM consulted.   Interventions/ Plan   Levophed for hypotension related to sepsis. Monitor volume overload as patient has history of CHF and CKD.       Raenette Rover, DNP, [place]

## 2022-01-03 NOTE — H&P (Signed)
History and Physical      [name] [name]:[id number] DOB: 09-11-1932 DOA: 01/02/2022  PCP: Unk Pinto, MD  Patient coming from: home   I have personally briefly reviewed patient's old medical records in [place]  Chief Complaint: Shortness of breath  HPI: [name] [name] is a 88 y.o. female with medical history significant for type 2 diabetes mellitus, anemia of chronic disease associated baseline hemoglobin range 9-11, acquired hypothyroidism, stage IV CKD with baseline creatinine 1.9-2.6, seizures, moderate aortic stenosis, chronic diastolic heart failure, who is admitted to [facility] on 01/02/2022 with suspected severe sepsis in the setting of HCAP pneumonia after presenting from home to [facility] ED complaining of shortness of breath.   The patient reports 2 to 3 days of progressive shortness of breath associated with new onset productive cough in the absence of any hemoptysis.  She notes associated subjective fever in the absence of any chills, full body rigors, or generalized myalgias.  Not associate with any new rhinitis, rhinorrhea, sore throat, rash.  Denies any recent headache, neck stiffness, abdominal pain, nausea, vomiting, diarrhea.  No recent dysuria or gross hematuria.  She conveys that her shortness of breath is not associate with any orthopnea or PND.  Over the last few days, she has noted a slight increase in edema in the bilateral lower extremities, left greater than right.  Not associate with any new onset calf tenderness or new onset lower extremity erythema.  No recent trauma.   Denies any associated chest pain, palpitations, diaphoresis, dizziness, presyncope, or syncope.  She confirms no known baseline supplemental oxygen requirements.   Over the last 1 to 2 days, she also notes generalized weakness in the absence of any associated acute focal weakness, acute focal numbness, paresthesias, facial droop, slurred speech, expressive aphasia, acute  change in vision, dysphagia, vertigo.  In the setting of this generalized weakness, the patient conveys that she tripped while attempting to ambulate at home.  Did not hit her head as competitive this fall and there is no associated loss of consciousness.  She denies any specific acute arthralgias or acute myalgias as a consequence of this fall.  She was recently hospitalized in the [facility] system from 10/14/21 to 10/20/21 for septic shock due to streptococcal bacteremia, with source undetermined and was treated initially with IV antibiotics.  She is initially admitted to the ICU and started on vasopressors.  She subsequently developed acute volume overload in the setting of acute on chronic diastolic heart failure, complicated by acute hypoxic respiratory failure, and was subsequently diuresed with ensuing resolution of supplemental oxygen requirements, noting that the patient was ultimately discharged on room air.  In the setting of streptococcal bacteremia of unclear source, infectious disease was consulted, and recommended a total of 10 days of antibiotic coverage, with the patient ultimately discharged on 7 days of amoxicillin in order to complete this recommended 10-day course.  Medical history notable for both moderate aortic stenosis as well as chronic diastolic heart failure.  Most recent echocardiogram on 10/14/2021 was notable for the following: LVEF 60 to 65%, no focal motion normalities, grade 2 diastolic dysfunction, normal right ventricular systolic function, trivial mitral regurgitation, moderate aortic stenosis with aortic valve area calculated via continuity equation using VTI to be 0.88 cm2.  She also has a history of stage IV CKD with baseline creatinine range from 1.9-2.6.      ED Course:  Vital signs in the ED were notable for the following: Afebrile; heart rate  00-37; systolic blood pressures in the 1 teens to 130s; respiratory rate 21-29; initial oxygen saturation 86% on room air,  subsequent improving into the range of 93 to 95% on 3 L nasal cannula.  Labs were notable for the following: CMP notable for the following: Sodium 136, which corrected to approximately 139 when taking into account concomitant hyperglycemia, creatinine 2.72, 307, liver enzymes within normal limits.  Serum magnesium level 1.9.  BNP pending.  Initial lactate 3.3, with repeat value currently pending.  CBC notable for white blood cell count 13,300 with 90% neutrophils, hemoglobin 9.2 associated with MCV 89.5, relative to most recent prior hemoglobin value of 10.7 on 11/05/2021, platelet count 330.  INR 1.5.  Urinalysis ordered, with result currently pending.  Blood cultures x2 collected prior to initiation of IV antibiotics.  COVID-19/influenza PCR pending.  Per my interpretation, EKG in ED demonstrated the following: In comparison to most recent prior EKG from 11/07/2021, presenting EKG appears to demonstrate sinus rhythm with right bundle branch block, heart rate 74, T wave inversions in leads III, aVF, V1, V2, and V3, which the T wave inversions in leads III, aVF, V1 and V3 appear to be unchanged from most recent prior EKG, while showing no evidence of ST changes, including no evidence of ST elevation.    Imaging and additional notable ED work-up: Chest x-ray, per radiology review, shows evidence of bibasilar airspace opacities concerning for infiltrates absence of evidence of interstitial or pulmonary edema, and in the absence of evidence of pleural effusion or pneumothorax.  Additionally, CT chest has been ordered, with result currently pending.  CT head shows no evidence of acute intracranial process, including no evidence of acute intracranial hemorrhage nor any evidence of acute infarct.  CT cervical spine shows no evidence of acute cervical spine fracture or subluxation injury.  While in the ED, the following were administered: Azithromycin.  Additionally, Rocephin was preliminarily  ordered.  Subsequently, the patient was admitted for further evaluation and management of suspected severe sepsis due to HCAP pneumonia, complicated by acute hypoxic respiratory distress in the setting of generalized weakness and the appearance of ground-level mechanical fall.   Review of Systems: As per HPI otherwise 10 point review of systems negative.   Past Medical History:  Diagnosis Date   Anemia, unspecified    Arthritis    "knees; right shoulder" (09/15/2013)   Basal cell carcinoma    "right upper outer lip"   DM neuropathy, type II diabetes mellitus ([place])    Dyspnea 2021   with low iron   GERD (gastroesophageal reflux disease)    Gout    High cholesterol    Hypertension    Hypothyroidism    Meniere's disease    Obesity (BMI 30-39.9)    Other forms of epilepsy and recurrent seizures without mention of intractable epilepsy 11/04/2012   Non convulsive paroxysmal spells, responding to [facility], patient not driving.    Pneumonia ~ 1943   Skin cancer    "forehead; right hand"   Stage 4 chronic kidney disease ([place])    UTI (urinary tract infection)    Vitamin D deficiency     Past Surgical History:  Procedure Laterality Date   BIOPSY  04/05/2020   Procedure: BIOPSY;  Surgeon: Jerene Bears, MD;  Location: [facility];  Service: Gastroenterology;;   CERVICAL POLYPECTOMY     COLONOSCOPY WITH PROPOFOL N/A 04/06/2020   Procedure: COLONOSCOPY WITH PROPOFOL;  Surgeon: Jerene Bears, MD;  Location: [facility];  Service: Gastroenterology;  Laterality: N/A;   ESOPHAGOGASTRODUODENOSCOPY (EGD) WITH PROPOFOL N/A 04/05/2020   Procedure: ESOPHAGOGASTRODUODENOSCOPY (EGD) WITH PROPOFOL;  Surgeon: Jerene Bears, MD;  Location: [facility];  Service: Gastroenterology;  Laterality: N/A;   HAMMER TOE SURGERY Left 1980's   MOHS SURGERY  20087   "right upper outer lip"   POLYPECTOMY  04/06/2020   Procedure: POLYPECTOMY;  Surgeon: Jerene Bears, MD;  Location: [facility];  Service:  Gastroenterology;;   TONSILLECTOMY  ~ [place] Left ~ 1950   "ran arm thru window"    Social History:  reports that she quit smoking about 38 years ago. Her smoking use included cigarettes. She has a 30.00 pack-year smoking history. She has never used smokeless tobacco. She reports current alcohol use. She reports that she does not use drugs.   Allergies  Allergen Reactions   Ppd [Tuberculin Purified Protein Derivative] Other (See Comments)    indurated    Family History  Problem Relation Age of Onset   Heart disease Mother    Stroke Mother    Heart disease Father    Ovarian cancer Sister    Hypertension Son    Hypertension Son    Diabetes Son    Alcohol abuse Son     Family history reviewed and not pertinent    Prior to Admission medications   Medication Sig Start Date End Date Taking? Authorizing Provider  acetaminophen (TYLENOL) 500 MG tablet Take 1,000 mg by mouth every 6 (six) hours as needed for moderate pain.   Yes [provider]  allopurinol (ZYLOPRIM) 100 MG tablet Take 1 tablet (100 mg total) by mouth 2 (two) times daily. Take  1 tablet  Daily  to prevent Gout. 02/07/21  Yes Alycia Rossetti, NP  aspirin 81 MG tablet Take 81 mg by mouth daily.   Yes [provider]  atenolol (TENORMIN) 50 MG tablet TAKE ONE TABLET BY MOUTH DAILY GENERIC EQUIVALENT FOR TENORMIN Patient taking differently: Take 50 mg by mouth daily. 06/06/21  Yes Cranford, Kenney Houseman, NP  cholecalciferol (VITAMIN D3) 25 MCG (1000 UNIT) tablet Take 3,000 Units by mouth daily.   Yes [provider]  fish oil-omega-3 fatty acids 1000 MG capsule Take 1,000 mg by mouth at bedtime.   Yes [provider]  furosemide (LASIX) 40 MG tablet TAKE 1 TABLET BY MOUTH DAILY FOR FLUID RETENTION AND ANKLE SWELLING Patient taking differently: Take 40 mg by mouth daily. 03/21/21  Yes Alycia Rossetti, NP  HUMULIN 70/30 (70-30) 100 UNIT/ML injection INJECT 20 UNITS UNDER THE  SKIN TWICE DAILY WITH MEALS Patient taking differently: Inject 20 Units into the skin 2 (two) times daily with a meal. 06/25/21  Yes Alycia Rossetti, NP  lansoprazole (PREVACID) 30 MG capsule TAKE 1 CAPSULE BY MOUTH DAILY TO PREVENT HEARTBURN AND INDIGESTION. Patient taking differently: Take 30 mg by mouth daily. 12/19/20  Yes Alycia Rossetti, NP  levETIRAcetam (KEPPRA) 500 MG tablet Take  1 tablet 2  x /day  with Meals  to Prevent Seizures Patient taking differently: 250 mg in the morning and 539m in the evening 07/10/21  Yes MUnk Pinto MD  levothyroxine (SYNTHROID) 100 MCG tablet TAKE 1 TABLET BY MOUTH DAILY ON AN EMPTY STOMACH WITH ONLY WATER FOR 30 MINUTES. NO ANTACIDS, CALCIUM OR MAGNESIUM FOR 4 HOURS. AVOID BIOTIN Patient taking differently: Take 100 mcg by mouth daily before breakfast. 11/29/20  Yes WAlycia Rossetti NP  loratadine (CLARITIN) 10 MG tablet Take 10 mg  by mouth daily.   Yes [provider]  meclizine (ANTIVERT) 25 MG tablet Take   1 tablet 2 to 3 x /day  ONLY  if needed for Dizziness /Vertigo 07/10/21  Yes Unk Pinto, MD  rOPINIRole (REQUIP) 1 MG tablet TAKE ONE-HALF TO ONE TABLET BY MOUTH THREE TIMES DAILY AS NEEDED FOR RESTLESS LEGS& CRAMPS 12/03/21  Yes Alycia Rossetti, NP  vitamin B-12 (CYANOCOBALAMIN) 100 MCG tablet Take 500 mcg by mouth daily.   Yes [provider]  vitamin C (ASCORBIC ACID) 500 MG tablet Take 500 mg by mouth daily.   Yes [provider]  BD VEO INSULIN SYRINGE U/F 31G X 15/64" 0.3 ML MISC 2 (two) times daily. 06/18/21   [provider]  Blood Glucose Monitoring Suppl (ONETOUCH VERIO) w/Device KIT Check  Blood Sugar   4 x /day before Meals & Bedtime   (( Dx-  e11.22 )) 11/03/20   Unk Pinto, MD  glucose blood [facility] VERIO) test strip Check Blood Sugar 4 x /day  (( Dx-  e11.22 )) 11/03/20   Unk Pinto, MD  hydrocortisone (ANUSOL-HC) 2.5 % rectal cream Place rectally 2 (two) times daily. Patient  not taking: Reported on 10/13/2021 04/06/20   Kayleen Memos, DO  Lancets [facility] ULTRASOFT) lancets Check Blood Sugar  4 x /day  before Meals & Bedtime   (( Dx-  e11.22 )) 11/03/20   Unk Pinto, MD     Objective    Physical Exam: Vitals:   01/02/22 2253 01/02/22 2257 01/02/22 2300 01/02/22 2315  BP:   (!) 123/37 (!) 136/42  Pulse:   69 72  Resp:   (!) 21 (!) 29  Temp:      TempSrc:      SpO2: 95% (S) 95% 91% 92%    General: appears to be stated age; alert, oriented; mildly increased work of breathing noted Skin: warm, dry, no rash Head:  AT/[place] Mouth:  Oral mucosa membranes appear moist, normal dentition Neck: supple; trachea midline Heart:  RRR; did not appreciate any M/R/G Lungs: CTAB, did not appreciate any wheezes, rales, or rhonchi Abdomen: + BS; soft, ND, NT Vascular: 2+ pedal pulses b/l; 2+ radial pulses b/l Extremities:  no muscle wasting; 1-2+ edema in the bilateral lower extremities, left greater than right Neuro: strength and sensation intact in upper and lower extremities b/l     Labs on Admission: I have personally reviewed following labs and imaging studies  CBC: Recent Labs  Lab 01/02/22 2315 01/02/22 2344  WBC 13.3*  --   NEUTROABS 12.0*  --   HGB 9.2* 10.5*  HCT 32.3* 31.0*  MCV 89.5  --   PLT 330  --    Basic Metabolic Panel: Recent Labs  Lab 01/02/22 2315 01/02/22 2344  NA 136 140  K 4.9 4.6  CL 110 109  CO2 16*  --   GLUCOSE 307* 309*  BUN 60* 64*  CREATININE 2.72* 2.70*  CALCIUM 8.7*  --   MG 1.9  --    GFR: CrCl cannot be calculated (Unknown ideal weight.). Liver Function Tests: Recent Labs  Lab 01/02/22 2315  AST 32  ALT 20  ALKPHOS 56  BILITOT 0.8  PROT 6.4*  ALBUMIN 3.7   No results for input(s): "LIPASE", "AMYLASE" in the last 168 hours. No results for input(s): "AMMONIA" in the last 168 hours. Coagulation Profile: Recent Labs  Lab 01/02/22 2315  INR 1.5*   Cardiac Enzymes: No results for input(s):  "CKTOTAL", "  CKMB", "CKMBINDEX", "TROPONINI" in the last 168 hours. BNP (last 3 results) No results for input(s): "PROBNP" in the last 8760 hours. HbA1C: No results for input(s): "HGBA1C" in the last 72 hours. CBG: No results for input(s): "GLUCAP" in the last 168 hours. Lipid Profile: No results for input(s): "CHOL", "HDL", "LDLCALC", "TRIG", "CHOLHDL", "LDLDIRECT" in the last 72 hours. Thyroid Function Tests: No results for input(s): "TSH", "T4TOTAL", "FREET4", "T3FREE", "THYROIDAB" in the last 72 hours. Anemia Panel: No results for input(s): "VITAMINB12", "FOLATE", "FERRITIN", "TIBC", "IRON", "RETICCTPCT" in the last 72 hours. Urine analysis:    Component Value Date/Time   COLORURINE YELLOW 11/12/2021 [place] 11/12/2021 0954   LABSPEC 1.012 11/12/2021 0954   PHURINE < OR = 5.0 11/12/2021 0954   GLUCOSEU NEGATIVE 11/12/2021 0954   HGBUR NEGATIVE 11/12/2021 0954   BILIRUBINUR NEGATIVE 10/14/2021 0938   KETONESUR NEGATIVE 11/12/2021 0954   PROTEINUR NEGATIVE 11/12/2021 0954   UROBILINOGEN 0.2 06/06/2014 0940   NITRITE NEGATIVE 11/12/2021 0954   LEUKOCYTESUR NEGATIVE 11/12/2021 0954    Radiological Exams on Admission: CT CHEST WO CONTRAST  Result Date: 01/03/2022 CLINICAL DATA:  Fall injury at home with hypoxia on presentation. Shortness of breath. EXAM: CT CHEST WITHOUT CONTRAST TECHNIQUE: Multidetector CT imaging of the chest was performed following the standard protocol without IV contrast. RADIATION DOSE REDUCTION: This exam was performed according to the departmental dose-optimization program which includes automated exposure control, adjustment of the mA and/or kV according to patient size and/or use of iterative reconstruction technique. COMPARISON:  The portable chest earlier today, chest radiograph 10/16/2021, and chest CT without contrast of 09/18/2013. FINDINGS: Cardiovascular: There is mild cardiomegaly and a small pericardial effusion both increased  since 2015. The pulmonary arteries are upper-normal in caliber. The pulmonary veins are decompressed. Calcifications are noted in the aortic valve leaflets, with moderate patchy calcification in the aortic wall, scattered calcification in the great vessels. There is no aortic aneurysm. Mediastinum/Nodes: Small hiatal hernia. No esophageal thickening is seen or tracheal filling defects. Thyroid gland both axillary spaces are unremarkable. There are mildly prominent lymph nodes in the upper left hilar region and in the precarinal space to the right, both measuring up to 1.1 cm in short axis. These were present previously. Hilar adenopathy is difficult to evaluate without contrast but no other hilar adenopathy is definitively noted. Azygoesophageal recess lymph nodes have increased in prominence now 1.3 cm in short axis, previously 8 mm. There is no other visible adenopathy without contrast. Lungs/Pleura: There are small bilateral layering pleural effusions with slightly greater fluid on the right. There is adjacent compressive atelectasis. There is bronchial thickening in both lower lobes with scattered bilateral posterior basal impacted bronchioles. There is interlobular septal thickening in both lung bases which is probably due to interstitial edema. Edema is not seen elsewhere. There is reticulated scarring at both lung apices and again noted chronic subpleural reticulation in the right middle lobe and lateral right lower lobe base. The remainder of the lung fields are clear. There is no pleural thickening or pneumothorax. Upper Abdomen: Splenomegaly again noted, mildly increased, AP diameter was previously 13.9 cm now 14.8 cm. There is bilateral renal cortical thinning. No acute findings. Musculoskeletal: There is osteopenia, degenerative changes and kyphosis of the thoracic spine. No acute or significant osseous finding is seen. No chest wall lesion. IMPRESSION: 1. Cardiomegaly with small pericardial effusion. 2.  Upper-normal caliber pulmonary arteries. 3. Aortic and coronary artery atherosclerosis. 4. Small layering pleural effusions with adjacent compressive  atelectasis. 5. Bilateral lower lobe bronchial thickening with scattered impacted bronchioles. No focal pneumonia is seen. 6. Interlobular septal thickening in the lung bases probably due to mild interstitial edema. Question mild CHF or fluid overload but no venous distention is evident. 7. Chronic changes in the right middle lobe and lateral right lower lobe base. 8. Mildly prominent mediastinal lymph nodes, increased in size from 2015. 9. Splenomegaly, mildly increased from 2015. 10. Small hiatal hernia. 11. Osteopenia, degenerative change and kyphosis of the thoracic spine. No acute or significant osseous findings. 12. Bilateral renal cortical thinning. Electronically Signed   By: Telford Nab M.D.   On: 01/03/2022 00:01   CT Head Wo Contrast  Result Date: 01/02/2022 CLINICAL DATA:  Head trauma, minor (Age >= 65y); Neck trauma (Age >= 65y) EXAM: CT HEAD WITHOUT CONTRAST CT CERVICAL SPINE WITHOUT CONTRAST TECHNIQUE: Multidetector CT imaging of the head and cervical spine was performed following the standard protocol without intravenous contrast. Multiplanar CT image reconstructions of the cervical spine were also generated. RADIATION DOSE REDUCTION: This exam was performed according to the departmental dose-optimization program which includes automated exposure control, adjustment of the mA and/or kV according to patient size and/or use of iterative reconstruction technique. COMPARISON:  CT head and cervical spine 08/02/2021 FINDINGS: CT HEAD FINDINGS Brain: No evidence of large-territorial acute infarction. No parenchymal hemorrhage. No mass lesion. No extra-axial collection. No mass effect or midline shift. No hydrocephalus. Basilar cisterns are patent. Vascular: No hyperdense vessel. Atherosclerotic calcifications are present within the cavernous internal  carotid arteries. Skull: No acute fracture or focal lesion. Sinuses/Orbits: Left maxillary sinus mucosal thickening. Otherwise paranasal sinuses and mastoid air cells are clear. Bilateral lens replacement. Otherwise the orbits are unremarkable. Other: None. CT CERVICAL SPINE FINDINGS Alignment: Stable grade 1 anterolisthesis of C5 on C6. Stable mild retrolisthesis of C3 on C4. Skull base and vertebrae: Multilevel mild degenerative changes spine . No severe osseous central canal stenosis or neural foraminal stenosis. No acute fracture. No aggressive appearing focal osseous lesion or focal pathologic process. Soft tissues and spinal canal: No prevertebral fluid or swelling. No visible canal hematoma. Upper chest: Unremarkable. Other: None. IMPRESSION: 1. No acute intracranial abnormality. 2. No acute displaced fracture or traumatic listhesis of the cervical spine. Electronically Signed   By: Iven Finn M.D.   On: 01/02/2022 23:52   CT Cervical Spine Wo Contrast  Result Date: 01/02/2022 CLINICAL DATA:  Head trauma, minor (Age >= 65y); Neck trauma (Age >= 65y) EXAM: CT HEAD WITHOUT CONTRAST CT CERVICAL SPINE WITHOUT CONTRAST TECHNIQUE: Multidetector CT imaging of the head and cervical spine was performed following the standard protocol without intravenous contrast. Multiplanar CT image reconstructions of the cervical spine were also generated. RADIATION DOSE REDUCTION: This exam was performed according to the departmental dose-optimization program which includes automated exposure control, adjustment of the mA and/or kV according to patient size and/or use of iterative reconstruction technique. COMPARISON:  CT head and cervical spine 08/02/2021 FINDINGS: CT HEAD FINDINGS Brain: No evidence of large-territorial acute infarction. No parenchymal hemorrhage. No mass lesion. No extra-axial collection. No mass effect or midline shift. No hydrocephalus. Basilar cisterns are patent. Vascular: No hyperdense vessel.  Atherosclerotic calcifications are present within the cavernous internal carotid arteries. Skull: No acute fracture or focal lesion. Sinuses/Orbits: Left maxillary sinus mucosal thickening. Otherwise paranasal sinuses and mastoid air cells are clear. Bilateral lens replacement. Otherwise the orbits are unremarkable. Other: None. CT CERVICAL SPINE FINDINGS Alignment: Stable grade 1 anterolisthesis of C5 on C6.  Stable mild retrolisthesis of C3 on C4. Skull base and vertebrae: Multilevel mild degenerative changes spine . No severe osseous central canal stenosis or neural foraminal stenosis. No acute fracture. No aggressive appearing focal osseous lesion or focal pathologic process. Soft tissues and spinal canal: No prevertebral fluid or swelling. No visible canal hematoma. Upper chest: Unremarkable. Other: None. IMPRESSION: 1. No acute intracranial abnormality. 2. No acute displaced fracture or traumatic listhesis of the cervical spine. Electronically Signed   By: Iven Finn M.D.   On: 01/02/2022 23:52   DG Chest Port 1 View  Result Date: 01/02/2022 CLINICAL DATA:  Questionable sepsis.  Evaluate abnormality. EXAM: PORTABLE CHEST 1 VIEW COMPARISON:  Chest radiograph dated October 16, 2021 FINDINGS: The heart is enlarged. Atherosclerotic calcification of the aortic arch. Biapical pleural/parenchymal scarring. Bibasilar atelectasis or infiltrate. No pleural effusion or pneumothorax. No acute osseous abnormality. IMPRESSION: 1. Bibasilar atelectasis or infiltrate. 2. Cardiomegaly. Electronically Signed   By: Keane Police D.O.   On: 01/02/2022 23:21      Assessment/Plan    Principal Problem:   Severe sepsis (HCC) Active Problems:   Acquired hypothyroidism   Essential hypertension   DM2 (diabetes mellitus, type 2) (HCC)   Stage 4 chronic kidney disease ([place])   Anemia of chronic disease   Acute respiratory failure with hypoxia (HCC)   HCAP (healthcare-associated pneumonia)   Generalized weakness    Fall at home, initial encounter   History of seizures   Allergic rhinitis   Aortic stenosis   Chronic diastolic CHF (congestive heart failure) ([place])       #) Severe sepsis due to  HCAP: Suspected diagnosis on the basis of 2 - 3 days of progressive shortness of breath associate with new onset productive cough, subjective fever, CBC demonstrating leukocytosis with neutrophilic predominance, with chest x-ray suggestive of bibasilar infiltrates, as further detailed above.  In the setting of recent hospitalization, within the last 3 months, during which the patient received broad-spectrum IV antibiotics, she is at increased risk for resistant bacteria, and appears to meet criteria for HCAP on this premise.  Consequently, will discontinue existing order for Rocephin, and expand IV antibiotics to include IV vancomycin, cefepime, while continuing azithromycin for atypical coverage.  SIRS criteria met via leukocytosis, tachypnea. Lactic acid level: 3.3. Of note, given the associated presence of suspected end organ damage in the form of concominant presenting elevated lactate as well as presenting acute hypoxic respiratory distress, criteria are met for pt's sepsis to be considered severe in nature. However, in the absence of lactic acid level that is greater than or equal to 4.0, and in the absence of any associated hypotension refractory to IVF's, there are no indications for administration of a 30 mL/kg IVF bolus at this time.  Additionally, given the above history in which the patient became volume overloaded during her most recent prior hospitalization for underlying infection, and given 1 going increased risk for recurrence of this volume overload in the setting of her history of chronic diastolic heart failure moderate aortic stenosis, will refrain from aggressive IV fluids at this time.   Additional ED work-up/management notable for: Collection of blood cultures x2.  Repeat lactate ordered, with result  currently pending.  Of note, urinalysis as well as COVID-19/influenza PCR results are currently pending.   Plan: CBC w/ diff and CMP in AM.  Follow for results of blood cx's x 2. Abx: Discontinue Rocephin.  Proceed with IV vancomycin, cefepime, and azithromycin, as further detailed above. Check MRSA  PCR, which, if negative, can consider discontinuation of IV vancomycin given the high negative predictive value for MRSA pneumonia associated with such as result.  Follow-up for repeat lactate.  Sputum culture.  Add on procalcitonin level.  Flutter valve, incentive spirometry.  Add on strep pneumoniae urine antigen.  Follow for result of COVID-19/influenza PCR.  Follow-up for result of urinalysis.  Follow-up result of CT chest to confirm bibasilar infiltrates identified on presenting chest x-ray.              #) Acute hypoxic respiratory distress: in the context of acute respiratory symptoms and no known baseline supplemental O2 requirements, presenting O2 sat note to be in the mid 80s, subsequently improving into the low to mid 90s on 3 L nasal cannula, thereby meeting criteria for acute hypoxic respiratory distress as opposed to acute hypoxic respiratory failure at this time. Appears to be on basis of HCAP pneumonia, with presenting chest x-ray showing evidence to suggest bibasilar infiltrates in the absence of additional acute process. No known chronic underlying pulmonary conditions, although a degree of chronic obstructive is possible given that she is a former smoker with a 30-pack-year history.   In terms of other considered etiologies, ACS appears less likely at this time in the absence of any recent CP and in the context of presenting EKG showing no e/o acute ischemic process. No  radiographic evidence to suggest acutely decompensated heart failure at this time.  However, as described above, she is certainly at risk for development of acute on chronic diastolic heart failure, particularly  given her known history of moderate aortic stenosis. clinically, presentation is less suggestive of acute PE at this time. COVID-19/Influenza PCR are pending.    Plan: further evaluation/management of presenting suspected severe sepsis due to HCAP pneumonia, as above, including broad-spectrum IV antibiotics, procalcitonin level, and closely monitoring for results of CT chest. Monitor continuous pulse ox with prn supplemental O2 to maintain O2 sats greater than or equal to 92%. monitor on telemetry. CMP/CBC in the AM. Check serum Mg and Phos levels. Check blood gas. Flutter valve, incentive spirometry.  Follow-up for the result of COVID-19/influenza PCR.            #) Generalized weakness:  1-2 day  duration of generalized weakness, in the absence of any evidence of acute focal neurologic deficits, including no evidence of acute focal weakness to suggest acute CVA, while also noting that today CTh showed no evidence of acute intracranial process.  Suspect contribution from physiologic stress stemming from presenting severe sepsis due to suspected HCAP pneumonia, with additional infectious evaluation ongoing, as detailed above Will further eval for any additional contributions from endocrine/metabolic sources, as detailed below.  Of note, the patient lives independently at baseline with this generalized weakness complicating her ability to complete her ADLs independently.   Plan: work-up and management of presenting suspected severe sepsis due to HCAP pneumonia, as described above. PT consult ordered for the AM. Fall precautions. CMP/CBC in the AM. Check TSH, serum Mg level.  Iron studies.  Follow-up result urinalysis.           #) Ground level mechanical fall: it appears that the patient experienced a ground level fall that appears to be purely mechanical in nature, with patient conveying that they tripped while attempting to ambulate at home, with suspected contribution from generalized  weakness as a predisposing factor.  without any preceding or resultant loss of consciousness. Not a/w any presyncope or chest pain. Did not  hit head as a component of this fall.  However, she is on blood thinners as outpatient in the form of daily baby aspirin.  As noted above, CT head/neck showed no evidence of acute process.  No overt evidence of acute fractures.  Plan: Fall precautions. Repeat CMP and CBC with differential in the morning.  PT consult ordered.  Further evaluation management of suspected presenting severe sepsis due to HCAP pneumonia, including broad-spectrum IV antibiotics.  Follow-up result urinalysis.  TSH.              #) Type 2 Diabetes Mellitus: documented history of such. Home insulin regimen: 70/30 insulin, 20 units SQ twice daily.  Consequently, her home basal insulin dose appears to be approximately 14 units SQ twice daily. home oral hypoglycemic agents: None. presenting blood sugar: 307, without concomitant evidence of anion gap metabolic acidosis.  Given that it appears that her diabetes is well controlled as an outpatient in light of most recent hemoglobin A1c of 5.7% in September 2023, suspect relative acute hyperglycemia as a consequence of physiologic stress stemming from presenting severe sepsis, as above. in terms of initial dose of basal insulin to be started during this hospitalization, will resume approximately half of outpatient dose in order to reduce risk for ensuing hypoglycemia  Plan: accuchecks QAC and HS with low dose SSI.  Levemir 8 units SQ twice daily.             #) Anemia of chronic disease: Documented history of such, a/w with baseline hgb range 9-11, with presenting hgb consistent with this range, in the absence of any overt evidence of active bleed.  will also evaluate for any optimize ability from an iron storage standpoint by checking iron studies.   Plan: Repeat CBC in the morning.  Check INR and PTT.  Type and screen ordered.   Add on iron studies, as above.             #) Essential Hypertension: documented h/o such, with outpatient antihypertensive regimen including Lopressor.  SBP's in the ED today: Normotensive.  However, in the setting of presenting severe sepsis as well as a history of septic shock a few months ago, will hold home beta-blocker for now.  Plan: Close monitoring of subsequent BP via routine VS. hold home beta-blocker for now, as above.              #) acquired hypothyroidism: documented h/o such, on Synthroid as outpatient.   Plan: cont home Synthroid.  In the setting of recent generalized weakness, will also check TSH level.              #) CKD Stage 4: Documented history of such, with baseline creatinine 1.9-2.6, with presenting creatinine consistent with this baseline.    Plan: Monitor strict I's and O's and daily weights.  Attempt to avoid nephrotoxic agents.  CMP/magnesium level in the AM.            #) History of seizures: Documented history of such, in the absence of any evidence of active seizures.  On Keppra as an outpatient.  Plan: Continue home [place].             #) Allergic Rhinitis: documented h/o such, on scheduled Claritin as an outpatient, in the absence of intranasal corticosteroid .    Plan: cont home Claritin.             #) Moderate aortic stenosis: Documented history of such, with most recent echocardiogram from August  2023 demonstrating aortic valve area by VTI 0.88 cm2, which appears to be borderline severe in nature.  Consequently, we will observe the potential for preload/afterload dependence associated with this process.   Plan: Monitor strict I's and O's and daily weights.  CMP/serum magnesium level in the morning.                #) Chronic diastolic heart failure: documented history of such, with most recent echocardiogram performed August 2023 showing LVEF 60 to 65% as well as grade 2  diastolic dysfunction, with additional details as conveyed above. No clinical/ evidence to suggest acutely decompensated heart failure at this time, patient is certainly at risk for ensuing development of such, particularly given the above history of at least moderate aortic stenosis.  As noted above, BNP and CT chest results currently pending.  Regardless, refraining from aggressive IV fluids, in spite of criteria for severe sepsis with elevated lactate, as above.  Lasix 40 mghome diuretic regimen reportedly consists of the following: Lasix 40 mg p.o. daily.    Plan: monitor strict I's & O's and daily weights. Repeat CMP in AM. Check serum mag level.  In the setting of suspected presenting severe sepsis, and history of septic shock in August 2023, will hold home Lasix for now.  Follow-up for result of BNP as well as CT chest.      DVT prophylaxis: SCD's   Code Status: Per my discussions with the patient this evening, she confirms that she is DNR, while also conveying that she would be amenable to intubation and a non-cardiac arrest scenario in which there is a primary pulmonary indication for such. Family Communication: none Disposition Plan: Per Rounding Team Consults called: none;  Admission status: inpatient     Renfrow DO [facility]   01/03/2022, 12:38 AM

## 2022-01-03 NOTE — Consult Note (Signed)
[name] [name] for Infectious Disease       Reason for Consult: bacteremia    Referring Physician: Dr. Carlis Abbott  Principal Problem:   Severe sepsis [facility]) Active Problems:   Acquired hypothyroidism   Essential hypertension   DM2 (diabetes mellitus, type 2) ([name] [name])   Stage 4 chronic kidney disease ([name] [name])   Anemia of chronic disease   Acute respiratory failure with hypoxia (HCC)   HCAP (healthcare-associated pneumonia)   Generalized weakness   Fall at home, initial encounter   History of seizures   Allergic rhinitis   Aortic stenosis   Chronic diastolic CHF (congestive heart failure) (HCC)    aspirin  81 mg Oral Daily   Chlorhexidine Gluconate Cloth  6 each Topical Daily   Chlorhexidine Gluconate Cloth  6 each Topical Q0600   insulin aspart  0-9 Units Subcutaneous TID WC   insulin detemir  8 Units Subcutaneous BID   levETIRAcetam  250 mg Oral Daily   levETIRAcetam  500 mg Oral QHS   levothyroxine  100 mcg Oral Q0600   loratadine  10 mg Oral Daily   midodrine  10 mg Oral TID WC   mupirocin ointment  1 Application Nasal BID   mouth rinse  15 mL Mouth Rinse 4 times per day    Recommendations: Continue with cefazolin TTE, TEE if no findings   Assessment: She has now recurrent GBS bacteremia of an unknown source. Endocarditis possible.  Some back pain so will consider lumbar imaging.     HPI: [name] [name] is a 88 y.o. female with a history of DM, CKD, anemia and came in with a report of shortness of breath.  No fever but with mild leukocytosis of 13.8 and blood cultures sent and now positive in 4/4 bottles with group B Streptococcus.  Of note, she was hospitalized in August with fever and blood cultures grew the same organism at that time.  She has had progressive weakness.  She has moderate aortic stenosis.     Review of Systems:  Constitutional: negative for fevers and chills All other systems reviewed and are negative    Past Medical History:  Diagnosis Date    Anemia, unspecified    Arthritis    "knees; right shoulder" (09/15/2013)   Basal cell carcinoma    "right upper outer lip"   DM neuropathy, type II diabetes mellitus ([name] [name])    Dyspnea 2021   with low iron   GERD (gastroesophageal reflux disease)    Gout    High cholesterol    Hypertension    Hypothyroidism    Meniere's disease    Obesity (BMI 30-39.9)    Other forms of epilepsy and recurrent seizures without mention of intractable epilepsy 11/04/2012   Non convulsive paroxysmal spells, [facility], patient not driving.    Pneumonia ~ 1943   Skin cancer    "forehead; right hand"   Stage 4 chronic kidney disease ([name] [name])    UTI (urinary tract infection)    Vitamin D deficiency     Social History   Tobacco Use   Smoking status: Former    Packs/day: 1.00    Years: 30.00    Total pack years: 30.00    Types: Cigarettes    Quit date: 06/29/1983    Years since quitting: 38.5   Smokeless tobacco: Never  Vaping Use   Vaping Use: Never used  Substance Use Topics   Alcohol use: Yes    Alcohol/week: 0.0 standard drinks of alcohol  Comment: 09/15/2013 "glass of wine maybe once/year"   Drug use: No    Family History  Problem Relation Age of Onset   Heart disease Mother    Stroke Mother    Heart disease Father    Ovarian cancer Sister    Hypertension Son    Hypertension Son    Diabetes Son    Alcohol abuse Son     Allergies  Allergen Reactions   Ppd [Tuberculin Purified Protein Derivative] Other (See Comments)    indurated    Physical Exam: Constitutional: in no apparent distress  Vitals:   01/03/22 1645 01/03/22 1700  BP: (!) 71/26 (!) 109/42  Pulse: 69 68  Resp: 16 (!) 21  Temp:    SpO2: 98% 97%   EYES: anicteric Cardiovascular: RRR with 3/6 SEM Respiratory: CTA B GI: soft Musculoskeletal: right foot with wounds, wrapped  Lab Results  Component Value Date   WBC 12.1 (H) 01/03/2022   HGB 8.1 (L) 01/03/2022   HCT 28.1 (L) 01/03/2022   MCV 88.4  01/03/2022   PLT 246 01/03/2022    Lab Results  Component Value Date   CREATININE 2.90 (H) 01/03/2022   BUN 73 (H) 01/03/2022   NA 138 01/03/2022   K 4.1 01/03/2022   CL 111 01/03/2022   CO2 18 (L) 01/03/2022    Lab Results  Component Value Date   ALT 21 01/03/2022   AST 22 01/03/2022   ALKPHOS 50 01/03/2022     Microbiology: Recent Results (from the past 240 hour(s))  Blood Culture (routine x 2)     Status: None (Preliminary result)   Collection Time: 01/02/22 11:15 PM   Specimen: BLOOD  Result Value Ref Range Status   Specimen Description   Final    BLOOD RIGHT ANTECUBITAL Performed at [facility], [place] [address] [place], [place] [address]    Special Requests   Final    BOTTLES DRAWN AEROBIC AND ANAEROBIC Blood Culture adequate volume Performed at [place] [address], [place], [place] [address]    Culture  Setup Time   Final    GRAM POSITIVE COCCI IN CHAINS IN BOTH AEROBIC AND ANAEROBIC BOTTLES CRITICAL RESULT CALLED TO, READ BACK BY AND VERIFIED WITH: PHARMD A PHAM [id number] AT 1340 BY CM Performed at [facility], [place] [address] [place], [place] [address]    Culture GRAM POSITIVE COCCI  Final   Report Status PENDING  Incomplete  Blood Culture ID Panel (Reflexed)     Status: Abnormal   Collection Time: 01/02/22 11:15 PM  Result Value Ref Range Status   Enterococcus faecalis NOT DETECTED NOT DETECTED Final   Enterococcus Faecium NOT DETECTED NOT DETECTED Final   Listeria monocytogenes NOT DETECTED NOT DETECTED Final   Staphylococcus species NOT DETECTED NOT DETECTED Final   Staphylococcus aureus (BCID) NOT DETECTED NOT DETECTED Final   Staphylococcus epidermidis NOT DETECTED NOT DETECTED Final   Staphylococcus lugdunensis NOT DETECTED NOT DETECTED Final   Streptococcus species DETECTED (A) NOT DETECTED Final    Comment: CRITICAL RESULT CALLED TO, READ BACK BY AND VERIFIED WITH: PHARMD A PHAM [id number] AT 1340 BY  CM    Streptococcus agalactiae DETECTED (A) NOT DETECTED Final    Comment: CRITICAL RESULT CALLED TO, READ BACK BY AND VERIFIED WITH: PHARMD A PHAM [id number] AT 1340 BY CM    Streptococcus pneumoniae NOT DETECTED NOT DETECTED Final   Streptococcus pyogenes NOT DETECTED NOT DETECTED Final   A.calcoaceticus-baumannii NOT  DETECTED NOT DETECTED Final   Bacteroides fragilis NOT DETECTED NOT DETECTED Final   Enterobacterales NOT DETECTED NOT DETECTED Final   Enterobacter cloacae complex NOT DETECTED NOT DETECTED Final   Escherichia coli NOT DETECTED NOT DETECTED Final   Klebsiella aerogenes NOT DETECTED NOT DETECTED Final   Klebsiella oxytoca NOT DETECTED NOT DETECTED Final   Klebsiella pneumoniae NOT DETECTED NOT DETECTED Final   Proteus species NOT DETECTED NOT DETECTED Final   Salmonella species NOT DETECTED NOT DETECTED Final   Serratia marcescens NOT DETECTED NOT DETECTED Final   Haemophilus influenzae NOT DETECTED NOT DETECTED Final   Neisseria meningitidis NOT DETECTED NOT DETECTED Final   Pseudomonas aeruginosa NOT DETECTED NOT DETECTED Final   Stenotrophomonas maltophilia NOT DETECTED NOT DETECTED Final   Candida albicans NOT DETECTED NOT DETECTED Final   Candida auris NOT DETECTED NOT DETECTED Final   Candida glabrata NOT DETECTED NOT DETECTED Final   Candida krusei NOT DETECTED NOT DETECTED Final   Candida parapsilosis NOT DETECTED NOT DETECTED Final   Candida tropicalis NOT DETECTED NOT DETECTED Final   Cryptococcus neoformans/gattii NOT DETECTED NOT DETECTED Final    Comment: Performed at [facility], [place] [address], [place], [place] [address]  Blood Culture (routine x 2)     Status: None (Preliminary result)   Collection Time: 01/03/22 12:26 AM   Specimen: BLOOD  Result Value Ref Range Status   Specimen Description   Final    BLOOD BLOOD RIGHT HAND Performed at [place] [address] [place], [place] [address]    Special Requests   Final     BOTTLES DRAWN AEROBIC AND ANAEROBIC Blood Culture adequate volume Performed at [place] [address] [place], [place] [address]    Culture  Setup Time   Final    GRAM POSITIVE COCCI IN CHAINS IN BOTH AEROBIC AND ANAEROBIC BOTTLES CRITICAL RESULT CALLED TO, READ BACK BY AND VERIFIED WITH: PHARMD CRYSTAL ROBERTSON ON 01/03/22 @ 1557 BY DRT Performed at [facility], [place] [address], [place], [place] [address]    Culture PENDING  Incomplete   Report Status PENDING  Incomplete  Resp Panel by RT-PCR (Flu A&B, Covid) Anterior Nasal Swab     Status: None   Collection Time: 01/03/22 12:31 AM   Specimen: Anterior Nasal Swab  Result Value Ref Range Status   SARS Coronavirus 2 by RT PCR NEGATIVE NEGATIVE Final    Comment: (NOTE) SARS-CoV-2 target nucleic acids are NOT DETECTED.  The SARS-CoV-2 RNA is generally detectable in upper respiratory specimens during the acute phase of infection. The lowest concentration of SARS-CoV-2 viral copies this assay can detect is 138 copies/mL. A negative result does not preclude SARS-Cov-2 infection and should not be used as the sole basis for treatment or other patient management decisions. A negative result may occur with  improper specimen collection/handling, submission of specimen other than nasopharyngeal swab, presence of viral mutation(s) within the areas targeted by this assay, and inadequate number of viral copies(<138 copies/mL). A negative result must be combined with clinical observations, patient history, and epidemiological information. The expected result is Negative.  Fact Sheet for Patients:  EntrepreneurPulse.com.au  Fact Sheet for Healthcare Providers:  IncredibleEmployment.be  This test is no t yet approved or cleared by the [place] FDA and  has been authorized for detection and/or diagnosis of SARS-CoV-2 by FDA under an Emergency Use Authorization (EUA). This  EUA will remain  in effect (meaning this test  can be used) for the duration of the COVID-19 declaration under Section 564(b)(1) of the Act, 21 U.S.C.section 360bbb-3(b)(1), unless the authorization is terminated  or revoked sooner.       Influenza A by PCR NEGATIVE NEGATIVE Final   Influenza B by PCR NEGATIVE NEGATIVE Final    Comment: (NOTE) The Xpert Xpress SARS-CoV-2/FLU/RSV plus assay is intended as an aid in the diagnosis of influenza from Nasopharyngeal swab specimens and should not be used as a sole basis for treatment. Nasal washings and aspirates are unacceptable for Xpert Xpress SARS-CoV-2/FLU/RSV testing.  Fact Sheet for Patients: EntrepreneurPulse.com.au  Fact Sheet for Healthcare Providers: IncredibleEmployment.be  This test is not yet approved or cleared by the [place] FDA and has been authorized for detection and/or diagnosis of SARS-CoV-2 by FDA under an Emergency Use Authorization (EUA). This EUA will remain in effect (meaning this test can be used) for the duration of the COVID-19 declaration under Section 564(b)(1) of the Act, 21 U.S.C. section 360bbb-3(b)(1), unless the authorization is terminated or revoked.  Performed at [facility], [place] [address] [place], [place] [address]   MRSA Next Gen by PCR, Nasal     Status: Abnormal   Collection Time: 01/03/22  2:50 AM   Specimen: Nasal Mucosa; Nasal Swab  Result Value Ref Range Status   MRSA by PCR Next Gen DETECTED (A) NOT DETECTED Final    Comment: (NOTE) The GeneXpert MRSA Assay (FDA approved for NASAL specimens only), is one component of a comprehensive MRSA colonization surveillance program. It is not intended to diagnose MRSA infection nor to guide or monitor treatment for MRSA infections. Test performance is not FDA approved in patients less than 18 years old. Performed at [facility], [place] [address], [place],  [place] [address]   Respiratory (~20 pathogens) panel by PCR     Status: None   Collection Time: 01/03/22  9:00 AM   Specimen: Nasopharyngeal Swab; Respiratory  Result Value Ref Range Status   Adenovirus NOT DETECTED NOT DETECTED Final   Coronavirus 229E NOT DETECTED NOT DETECTED Final    Comment: (NOTE) The Coronavirus on the Respiratory Panel, DOES NOT test for the novel  Coronavirus (2019 nCoV)    Coronavirus HKU1 NOT DETECTED NOT DETECTED Final   Coronavirus NL63 NOT DETECTED NOT DETECTED Final   Coronavirus OC43 NOT DETECTED NOT DETECTED Final   Metapneumovirus NOT DETECTED NOT DETECTED Final   Rhinovirus / Enterovirus NOT DETECTED NOT DETECTED Final   Influenza A NOT DETECTED NOT DETECTED Final   Influenza B NOT DETECTED NOT DETECTED Final   Parainfluenza Virus 1 NOT DETECTED NOT DETECTED Final   Parainfluenza Virus 2 NOT DETECTED NOT DETECTED Final   Parainfluenza Virus 3 NOT DETECTED NOT DETECTED Final   Parainfluenza Virus 4 NOT DETECTED NOT DETECTED Final   Respiratory Syncytial Virus NOT DETECTED NOT DETECTED Final   Bordetella pertussis NOT DETECTED NOT DETECTED Final   Bordetella Parapertussis NOT DETECTED NOT DETECTED Final   Chlamydophila pneumoniae NOT DETECTED NOT DETECTED Final   Mycoplasma pneumoniae NOT DETECTED NOT DETECTED Final    Comment: Performed at [facility], [place] [address] [place], [place] [address]    Hosteen Kienast W Ada Holness, [place] for Infectious Disease [facility] [email or web address] 01/03/2022, 5:08 PM

## 2022-01-03 NOTE — Progress Notes (Signed)
Pharmacy Antibiotic Note  [name] [name] is a 88 y.o. female admitted on 01/02/2022 with low SPO2 and fever..  Pharmacy has been consulted to dose cefepime and vancomycin for sepsis.  Plan: Vancomycin 1 gm IV x 1 then '500mg'$  q48h (AUC 446.4, Scr 2.7) Cefepime 2gm I(V q24h Follow renal function, cultures and clinical course     Temp (24hrs), Avg:98.3 F (36.8 C), Min:98.3 F (36.8 C), Max:98.3 F (36.8 C)  Recent Labs  Lab 01/02/22 2315 01/02/22 2344  WBC 13.3*  --   CREATININE 2.72* 2.70*  LATICACIDVEN 3.3*  --     CrCl cannot be calculated (Unknown ideal weight.).    Allergies  Allergen Reactions   Ppd [Tuberculin Purified Protein Derivative] Other (See Comments)    indurated    Antimicrobials this admission: 11/16 vanc >> 11/16 cefepime >> 11/16  Dose adjustments this admission:   Microbiology results: 11/16 BCx:  11/16 UCx:     Thank you for allowing pharmacy to be a part of this patient's care.  Dolly Rias RPh 01/03/2022, 12:47 AM

## 2022-01-03 NOTE — TOC Initial Note (Signed)
Transition of Care [facility]) - Initial/Assessment Note    Patient Details  Name: [name] [name] MRN: [id number] Date of Birth: 01-Feb-1933  Transition of Care [facility]) CM/SW Contact:    Roseanne Kaufman, RN Phone Number: 01/03/2022, 1:23 PM  Clinical Narrative:   Received message from [place] with [facility] that patient is is a resident with them. TOC will follow for level of care and discharge needs.                  Expected Discharge Plan: Skilled Nursing Facility Barriers to Discharge: Continued Medical Work up   Patient Goals and CMS Choice        Expected Discharge Plan and Services Expected Discharge Plan: [place] In-house Referral: NA Discharge Planning Services: CM Consult   Living arrangements for the past 2 months: [place]                 DME Arranged: N/A DME Agency: NA       HH Arranged: NA [place] Agency: NA        Prior Living Arrangements/Services Living arrangements for the past 2 months: [place]   Patient language and need for interpreter reviewed:: Yes Do you feel safe going back to the place where you live?: Yes      Need for Family Participation in Patient Care: Yes (Comment) Care giver support system in place?: Yes (comment) Current home services: Home PT ([facility]) Criminal Activity/Legal Involvement Pertinent to Current Situation/Hospitalization: No - Comment as needed  Activities of Daily Living Home Assistive Devices/Equipment: Environmental consultant (specify type), Eyeglasses, Dentures (specify type) ADL Screening (condition at time of admission) Patient's cognitive ability adequate to safely complete daily activities?: Yes Is the patient deaf or have difficulty hearing?: Yes Does the patient have difficulty seeing, even when wearing glasses/contacts?: No Does the patient have difficulty concentrating, remembering, or making decisions?: No Patient able to express need for assistance with ADLs?:  Yes Does the patient have difficulty dressing or bathing?: No Independently performs ADLs?: Yes (appropriate for developmental age) Does the patient have difficulty walking or climbing stairs?: Yes Weakness of Legs: Both Weakness of Arms/Hands: Both  Permission Sought/Granted Permission sought to share information with : Case Manager Permission granted to share information with : Yes, Verbal Permission Granted  Share Information with NAME: Case Manager           Emotional Assessment Appearance:: Appears stated age Attitude/Demeanor/Rapport: Engaged Affect (typically observed): Stable   Alcohol / Substance Use: Not Applicable Psych Involvement: No (comment)  Admission diagnosis:  Acute respiratory failure with hypoxia (HCC) [J96.01] Severe sepsis ([place]) [A41.9, R65.20] Pneumonia of both lower lobes due to infectious organism [J18.9] Patient Active Problem List   Diagnosis Date Noted   Severe sepsis ([place]) 01/03/2022   HCAP (healthcare-associated pneumonia) 01/03/2022   Generalized weakness 01/03/2022   Fall at home, initial encounter 01/03/2022   History of seizures 01/03/2022   Allergic rhinitis 01/03/2022   Aortic stenosis 01/03/2022   Chronic diastolic CHF (congestive heart failure) ([place]) 01/03/2022   Bacteremia due to group B Streptococcus 10/18/2021   Acute respiratory failure with hypoxia ([place]) 10/14/2021   Benign neoplasm of cecum    Benign neoplasm of rectum    Blood in stool    Melena    Acute blood loss anemia    GI bleed 04/04/2020   Anemia of chronic disease 11/02/2019   DM neuropathy, type II diabetes mellitus (HCC)    Stage 4 chronic  kidney disease ([place])    Obesity (BMI 30-39.9)    High cholesterol    Aortic valve thickening by Cardiac echo in 2015 11/01/2019   Iron deficiency anemia 05/10/2019   Secondary hyperparathyroidism of renal origin ([place]) 09/20/2018   DM2 (diabetes mellitus, type 2) ([place]) 12/08/2017   ACE inhibitor intolerance 12/08/2017    Vertigo of central origin 11/20/2015   Morbid obesity ([place]) BMI 35+ with T2DM, htn, hyperlipidemia, CKD, OSA 12/12/2014   OSA on CPAP 08/08/2014   Type II diabetes mellitus with neurological manifestations ([place]) 09/15/2013   Hyperlipidemia associated with type 2 diabetes mellitus ([place]) 06/27/2013   Vitamin D deficiency 06/27/2013   Seizure disorder ([place]) 11/04/2012   Epilepsy ([place]) 11/04/2012   Anemia 06/13/2008   COLONIC POLYPS 06/09/2008   Acquired hypothyroidism 06/09/2008   CKD stage 4 due to type 2 diabetes mellitus ([place]) 06/09/2008   Venous (peripheral) insufficiency 06/09/2008   Gastroesophageal reflux disease 06/09/2008   Essential hypertension 06/09/2008   PCP:  Unk Pinto, MD Pharmacy:   [facility] [place], [place] - [place] AT [address] [facility] [address] [place] [address] Phone: [phone] Fax: [phone]  [facility] SERVICE) [place], [place] [address] Phone: [phone] Fax: [phone]     Social Determinants of Health (SDOH) Interventions    Readmission Risk Interventions     No data to display

## 2022-01-03 NOTE — Progress Notes (Signed)
[place] Progress Note Patient Name: [name] [name] DOB: 12-12-1932 MRN: [id number]   Date of Service  01/03/2022  HPI/Events of Note  Nursing request to review CXR for central line placement. Review of CXR reveals right IJ central line tip in the upper right atrium. No pneumothorax.  eICU Interventions  Plan: OK to use R IJ central venous line.      Intervention Category Major Interventions: Other:  Lysle Dingwall 01/03/2022, 11:36 PM

## 2022-01-04 ENCOUNTER — Ambulatory Visit: Payer: Medicare Other | Admitting: Hematology

## 2022-01-04 ENCOUNTER — Inpatient Hospital Stay (HOSPITAL_COMMUNITY): Payer: Medicare Other

## 2022-01-04 ENCOUNTER — Other Ambulatory Visit: Payer: Medicare Other

## 2022-01-04 DIAGNOSIS — R7881 Bacteremia: Secondary | ICD-10-CM

## 2022-01-04 DIAGNOSIS — A419 Sepsis, unspecified organism: Secondary | ICD-10-CM | POA: Diagnosis not present

## 2022-01-04 DIAGNOSIS — R6520 Severe sepsis without septic shock: Secondary | ICD-10-CM | POA: Diagnosis not present

## 2022-01-04 LAB — MAGNESIUM: Magnesium: 2.4 mg/dL (ref 1.7–2.4)

## 2022-01-04 LAB — GLUCOSE, CAPILLARY
Glucose-Capillary: 136 mg/dL — ABNORMAL HIGH (ref 70–99)
Glucose-Capillary: 171 mg/dL — ABNORMAL HIGH (ref 70–99)
Glucose-Capillary: 131 mg/dL — ABNORMAL HIGH (ref 70–99)
Glucose-Capillary: 187 mg/dL — ABNORMAL HIGH (ref 70–99)

## 2022-01-04 LAB — ECHOCARDIOGRAM COMPLETE
AR max vel: 0.99 cm2
AV Area VTI: 1.02 cm2
AV Area mean vel: 0.94 cm2
AV Mean grad: 36 mmHg
AV Peak grad: 60.5 mmHg
Ao pk vel: 3.89 m/s
Area-P 1/2: 4.68 cm2
Calc EF: 63.6 %
Height: 62 in
S' Lateral: 2.53 cm
Single Plane A2C EF: 58.3 %
Single Plane A4C EF: 67.5 %
Weight: 2881.85 oz

## 2022-01-04 LAB — CBC
HCT: 30.1 % — ABNORMAL LOW (ref 36.0–46.0)
Hemoglobin: 8.7 g/dL — ABNORMAL LOW (ref 12.0–15.0)
MCH: 25.1 pg — ABNORMAL LOW (ref 26.0–34.0)
MCHC: 28.9 g/dL — ABNORMAL LOW (ref 30.0–36.0)
MCV: 87 fL (ref 80.0–100.0)
Platelets: 292 10*3/uL (ref 150–400)
RBC: 3.46 MIL/uL — ABNORMAL LOW (ref 3.87–5.11)
RDW: 22 % — ABNORMAL HIGH (ref 11.5–15.5)
WBC: 13.6 10*3/uL — ABNORMAL HIGH (ref 4.0–10.5)
nRBC: 0 % (ref 0.0–0.2)

## 2022-01-04 LAB — COOXEMETRY PANEL
Carboxyhemoglobin: 2.4 % — ABNORMAL HIGH (ref 0.5–1.5)
Methemoglobin: 1 % (ref 0.0–1.5)
O2 Saturation: 75.3 %
Total hemoglobin: 8.9 g/dL — ABNORMAL LOW (ref 12.0–16.0)

## 2022-01-04 LAB — BASIC METABOLIC PANEL
Anion gap: 6 (ref 5–15)
BUN: 75 mg/dL — ABNORMAL HIGH (ref 8–23)
CO2: 21 mmol/L — ABNORMAL LOW (ref 22–32)
Calcium: 8.9 mg/dL (ref 8.9–10.3)
Chloride: 109 mmol/L (ref 98–111)
Creatinine, Ser: 3.04 mg/dL — ABNORMAL HIGH (ref 0.44–1.00)
GFR, Estimated: 14 mL/min — ABNORMAL LOW (ref >60)
Glucose, Bld: 144 mg/dL — ABNORMAL HIGH (ref 70–99)
Potassium: 4.2 mmol/L (ref 3.5–5.1)
Sodium: 136 mmol/L (ref 135–145)

## 2022-01-04 LAB — URINE CULTURE: Culture: NO GROWTH

## 2022-01-04 LAB — PHOSPHORUS: Phosphorus: 5.2 mg/dL — ABNORMAL HIGH (ref 2.5–4.6)

## 2022-01-04 LAB — PROCALCITONIN: Procalcitonin: 5.93 ng/mL

## 2022-01-04 MED ORDER — BETHANECHOL CHLORIDE 10 MG PO TABS
10.0000 mg | ORAL_TABLET | Freq: Three times a day (TID) | ORAL | Status: AC
  Administered 2022-01-04 – 2022-01-06 (×9): 10 mg via ORAL
  Filled 2022-01-04 (×10): qty 1

## 2022-01-04 MED ORDER — ROPINIROLE HCL 1 MG PO TABS: 1.0000 mg | ORAL_TABLET | Freq: Once | ORAL | Status: DC

## 2022-01-04 MED ORDER — IOHEXOL 9 MG/ML PO SOLN
500.0000 mL | ORAL | Status: AC
  Administered 2022-01-04 (×2): 500 mL via ORAL

## 2022-01-04 MED ORDER — FUROSEMIDE 10 MG/ML IJ SOLN
80.0000 mg | Freq: Once | INTRAMUSCULAR | Status: AC
  Administered 2022-01-04: 80 mg via INTRAVENOUS
  Filled 2022-01-04: qty 8

## 2022-01-04 MED ORDER — IOHEXOL 9 MG/ML PO SOLN
ORAL | Status: AC
  Filled 2022-01-04: qty 1000

## 2022-01-04 MED ORDER — ROPINIROLE HCL 0.5 MG PO TABS
0.5000 mg | ORAL_TABLET | Freq: Once | ORAL | Status: AC
  Administered 2022-01-04: 0.5 mg via ORAL
  Filled 2022-01-04: qty 1

## 2022-01-04 MED ORDER — ROPINIROLE HCL 1 MG PO TABS
0.5000 mg | ORAL_TABLET | Freq: Every evening | ORAL | Status: DC | PRN
  Administered 2022-01-05 – 2022-01-06 (×2): 0.5 mg via ORAL
  Filled 2022-01-04: qty 1
  Filled 2022-01-04 (×2): qty 0.5

## 2022-01-04 NOTE — Progress Notes (Signed)
[place] Progress Note Patient Name: KRISTAL PERL DOB: 09-23-1932 MRN: [id number]   Date of Service  01/04/2022  HPI/Events of Note  Patient c/o back, side pain and leg cramps. Takes Requip at home.   eICU Interventions  Plan: Requip 0.5 mg PO X 1.     Intervention Category Major Interventions: Other:  Nickie Warwick Cornelia Copa 01/04/2022, 12:09 AM

## 2022-01-04 NOTE — Progress Notes (Signed)
    [facility] has been requested to perform a transesophageal echocardiogram on 11/21 at 12:30 pm for bacteremia.  After careful review of history and examination, the risks and benefits of transesophageal echocardiogram have been explained including risks of esophageal damage, perforation (1:10,000 risk), bleeding, pharyngeal hematoma as well as other potential complications associated with conscious sedation including aspiration, arrhythmia, respiratory failure and death. Alternatives to treatment were discussed, questions were answered. Patient is willing to proceed.   Rosaria Ferries, PA-C 01/04/2022 3:33 PM

## 2022-01-04 NOTE — Progress Notes (Signed)
  Echocardiogram 2D Echocardiogram has been performed.  [name] [name] 01/04/2022, 8:41 AM

## 2022-01-04 NOTE — Evaluation (Signed)
SLP Cancellation Note  Patient Details Name: [name] [name] MRN: [id number] DOB: September 28, 1932   Cancelled treatment:       Reason Eval/Treat Not Completed: Other (comment);Patient at procedure or test/unavailable (pt to have CT of abdomen and omnipaque ordered, will continue efforts, RN reports pt tolerating medications and po without coughing, odynophagia reported by pt since June, will continue effort)  Kathleen Lime, MS [place] Office [phone] Pager [phone]  Macario Golds 01/04/2022, 4:05 PM

## 2022-01-04 NOTE — Evaluation (Signed)
Physical Therapy Evaluation Patient Details Name: [name] [name] MRN: [id number] DOB: [birth year] Today's Date: 01/04/2022  History of Present Illness  [name] [name] is a 88 y.o. female with medical history significant for type 2 diabetes mellitus, anemia of chronic disease , acquired hypothyroidism, stage IV CKD, seizures, moderate aortic stenosis, chronic diastolic heart failure, who is [facility] to [facility] on [facility]/2022 with suspected severe sepsis in the setting of HCAP pneumonia after presenting from home to [facility] ED complaining of shortness of breath.  Clinical Impression  Pt admitted with above diagnosis. Pt ambulated 40' with RW, no loss of balance, SaO2 94% on 2L O2 while walking, SaO2 86% on room air at rest. Pt lives independently in a cottage at [place], she may need higher level of care upon DC, depending on progress.  Pt currently with functional limitations due to the deficits listed below (see PT Problem List). Pt will benefit from skilled PT to increase their independence and safety with mobility to allow discharge to the venue listed below.          Recommendations for follow up therapy are one component of a multi-disciplinary discharge planning process, led by the attending physician.  Recommendations may be updated based on patient status, additional functional criteria and insurance authorization.  Follow Up Recommendations Skilled nursing-short term rehab (<3 hours/day) Can patient physically be transported by private vehicle: Yes    Assistance Recommended at Discharge Intermittent Supervision/Assistance  Patient can return home with the following  A little help with walking and/or transfers;A little help with bathing/dressing/bathroom;Assistance with cooking/housework;Assist for transportation;Help with stairs or ramp for entrance    Equipment Recommendations None recommended by PT  Recommendations for Other Services       Functional Status  Assessment Patient has had a recent decline in their functional status and demonstrates the ability to make significant improvements in function in a reasonable and predictable amount of time.     Precautions / Restrictions Precautions Precautions: Fall Precaution Comments: fell PTA, monitor O2 Restrictions Weight Bearing Restrictions: No      Mobility  Bed Mobility Overal bed mobility: Needs Assistance Bed Mobility: Supine to Sit     Supine to sit: Min assist     General bed mobility comments: min A to raise trunk. Mild dizziness in sitting which resolved after ~2 minutes. BP supine 102/27, sitting 122/37.    Transfers Overall transfer level: Needs assistance Equipment used: Rolling walker (2 wheels) Transfers: Sit to/from Stand Sit to Stand: Min guard           General transfer comment: VCs hand placement    Ambulation/Gait Ambulation/Gait assistance: Min guard Gait Distance (Feet): 40 Feet Assistive device: Rolling walker (2 wheels) Gait Pattern/deviations: Step-through pattern, Decreased stride length Gait velocity: decr     General Gait Details: steady with RW, SaO2 86% on room air at rest, 94% on 2L O2 walking, distance limited by fatigue. Denied dizziness.  Stairs            Wheelchair Mobility    Modified Rankin (Stroke Patients Only)       Balance Overall balance assessment: History of Falls, Needs assistance Sitting-balance support: Feet supported, No upper extremity supported Sitting balance-Leahy Scale: Good     Standing balance support: Bilateral upper extremity supported Standing balance-Leahy Scale: Poor                               Pertinent  Vitals/Pain Pain Assessment Pain Assessment: Faces Faces Pain Scale: Hurts little more Pain Location: L  rib cage (2* fall) Pain Descriptors / Indicators: Sore Pain Intervention(s): Limited activity within patient's tolerance, Monitored during session    Home Living  Family/patient expects to be discharged to:: Private residence Living Arrangements: Alone   Type of Home: House Home Access: Level entry       Home Layout: One level Home Equipment: Rollator (4 wheels);Rolling Walker (2 wheels);Grab bars - toilet;Shower seat;Grab bars - tub/shower Additional Comments: has golf cart, lives in a house  at [employer]    Prior Function Prior Level of Function : Independent/Modified Independent             Mobility Comments: uses rollator in home ADLs Comments: independent     Hand Dominance        Extremity/Trunk Assessment   Upper Extremity Assessment Upper Extremity Assessment: Overall WFL for tasks assessed    Lower Extremity Assessment Lower Extremity Assessment: Overall WFL for tasks assessed;RLE deficits/detail;LLE deficits/detail RLE Sensation: history of peripheral neuropathy;decreased light touch LLE Sensation: history of peripheral neuropathy;decreased light touch    Cervical / Trunk Assessment Cervical / Trunk Assessment: Kyphotic  Communication   Communication: HOH  Cognition Arousal/Alertness: Awake/alert Behavior During Therapy: WFL for tasks assessed/performed Overall Cognitive Status: Within Functional Limits for tasks assessed                                          General Comments      Exercises     Assessment/Plan    PT Assessment Patient needs continued PT services  PT Problem List Decreased activity tolerance;Decreased mobility;Cardiopulmonary status limiting activity       PT Treatment Interventions      PT Goals (Current goals can be found in the Care Plan section)  Acute Rehab PT Goals Patient Stated Goal: to get strength back PT Goal Formulation: With patient Time For Goal Achievement: 01/18/22 Potential to Achieve Goals: Good    Frequency Min 3X/week     Co-evaluation               AM-PAC PT "6 Clicks" Mobility  Outcome Measure Help needed turning from your  back to your side while in a flat bed without using bedrails?: A Little Help needed moving from lying on your back to sitting on the side of a flat bed without using bedrails?: A Little Help needed moving to and from a bed to a chair (including a wheelchair)?: A Little Help needed standing up from a chair using your arms (e.g., wheelchair or bedside chair)?: A Little Help needed to walk in hospital room?: A Little Help needed climbing 3-5 steps with a railing? : A Lot 6 Click Score: 17    End of Session Equipment Utilized During Treatment: Oxygen;Gait belt Activity Tolerance: Patient tolerated treatment well;Patient limited by fatigue Patient left: in chair;with chair alarm set;with call bell/phone within reach Nurse Communication: Mobility status PT Visit Diagnosis: Difficulty in walking, not elsewhere classified (R26.2);History of falling (Z91.81)    Time: 7322-0254 PT Time Calculation (min) (ACUTE ONLY): 38 min   Charges:   PT Evaluation $PT Eval Moderate Complexity: 1 Mod PT Treatments $Gait Training: 8-22 mins $Therapeutic Activity: 8-22 mins       Blondell Reveal Kistler PT 01/04/2022  Acute Rehabilitation Services  Office [phone]

## 2022-01-04 NOTE — Progress Notes (Signed)
NAME:  [name] [name], MRN:  [id number], DOB:  11-18-1932, LOS: 1 ADMISSION DATE:  01/02/2022, CONSULTATION DATE:  11/16 REFERRING MD:  Ronita Hipps, CHIEF COMPLAINT:  hypotension   History of Present Illness:  [name] is an 88 year old woman with a history of CKD 4, HFpEF who presented to the hospital after mechanical fall.  She endorses having a nonproductive cough and shortness of breath for few days.  She has been having leg edema and abdominal distention recently.  She has chronic hypotension causing dizziness when she stands up.  She believes her fall was due to standing up too quickly, causing her to fall.  She continues to take atenolol despite chronic postural hypotension.  She has not checked her blood pressure at home recently due to her cuff not working, but reports she chronically has a low blood pressure.  She denies recent dysuria or urinary frequency.  Right now she is trying to the bathroom is unable to.  After being brought to the ED she was found to be febrile with a leukocytosis.  She was empirically started on azithromycin, ceftriaxone, vancomycin due to concern for pneumonia.  Overnight her blood pressure dropped and she was brought to the intensive care unit for vasopressors.  Pertinent  Medical History  HFpEF CKD 4 Postural hypotension Moderate AS PH DM  Significant Hospital Events: Including procedures, antibiotic start and stop dates in addition to other pertinent events   11/15 admitted for sepsis, fall 11/16 transferred to ICU for hypotension, ID consulted, on NE, infiltrated PIV w/ NE> R IJ CVL placed  CT chest 11/16 -no significant infiltrates.  Has bibasilar dependent pleural effusions with atelectasis overlying.  Small hilar adenopathy bilaterally.  Interim History / Subjective:  Had leg cramps overnight, did not sleep well/ much.  Having some residual left hip/ side pain from fall.   Objective   Blood pressure (!) 129/34, pulse 61, temperature 97.7  F (36.5 C), temperature source Oral, resp. rate 18, height '5\' 2"'$  (1.575 m), weight 81.7 kg, SpO2 92 %.        Intake/Output Summary (Last 24 hours) at 01/04/2022 1003 Last data filed at 01/04/2022 1003 Gross per 24 hour  Intake 1556.93 ml  Output 1325 ml  Net 231.93 ml    Filed Weights   01/03/22 0500 01/03/22 0700 01/04/22 0500  Weight: 83.5 kg 83.4 kg 81.7 kg    Examination: General:  pleasant elderly female lying in bed in NAD HEENT: MM pink/moist, no JVD, pupils 3/reactive Neuro:  alert/ oriented x3, MAE CV: rr, NSR 77'A, loud systolic murmur PULM:  non labored, clear, diminished in bases, could not appreciate basilar rales, on 2L, no wheeze GI: obese, soft, bs+, foley> cyu Extremities: warm/dry, trace LE edema  Skin: see below, new area of erythema to left anterior thigh as below, not overtly painful per pt, multiple meplex to heels  Left anterior thigh BLE Left forearm    Left forearm  Afebrile UOP 1.2L/ 24hrs Net +680  Labs> WBC 12> 13.6, Hgb/ HCT  8.7/ 30, K 4.2, BUN 73> 75, sCr 29> 3.04, phos 5.2, Mag 2.4, PCT 3.34> 4.34> 5.93  Resolved Hospital Problem list     Assessment & Plan:   Septic shock 2/2 recurrent GBS bacteremia (4/4) of unknown source  - does not appear to have pneumonia on CT scan.  With feeling of urinary retention, concern for UTI> UA neg.  Area of erythema on her left shin also raises concern for possible cellulitis.  New  area on left thigh 11/17 - prior hospitalization in 09/2021 with GBS bacteremia with source unknown then, cleared cultures.   Lactic acidosis, improving  - appreciate ID consult> cont cefazolin for now - pending TTE> if neg, will need TEE to r/o IE - ideally would have preferred peripheral IV given bacteremia/ clearance on abx, however CVL placed 11/16 given PIV infiltration of NE (s/p phentolamine) and poor IV access - UA neg - cont NE for MAP goal > 65 - continue midodrine '10mg'$  TID - repeat BC today  - echo pending  11/17, if neg will need TEE to r/o IE  - source remains unclear however patient reports she had a fall in June 2023 with skin injury to left foot which healed then sore to her right foot in which she has been seeing Dr. Sharol Given and wound care> with healing residual area to ball of right foot, ? If source is prior diabetic foot wound - possible cellulitis on RLE and new area of erythema to left thigh, see pics as above  AKI on CKD 4-follows with Dr. Royce Macadamia as an outpatient Urinary retention - sCr 2.9> 3.04, baseline 1.7- 1.9 (in Aug 2023) - UA unremarkable except protein 30 - cont foley, placed 11/16 - start retention meds - renal dose meds/ cont hemodynamic support - trend renal indices - remains on pressors, defer diureses for now - pt states she would not pursue HD if renal disease processed  Acute on chronic HFpEF, likely biventricular Moderate aortic stenosis Chronic PH-likely WHO group 2 - Eventually will need diuresis, need to maintain adequate perfusion first. - Monitor on telemetry - Monitor electrolytes  Chronic dizziness, mechanical falls - Likely not a good candidate to go home on atenolol.  Pt states is vertigo but fits more with orthostatic hypotension - PT  - fall precautions  Diabetes with hyperglycemia - cont SSI sensitive AC with levemir 8units BID  History of JAK2 positive myeloproliferative disorder-under surveillance Iron deficiency anemia - Previously has been recommended by hematology to avoid iron supplements- see 07/04/21 note from Dr. Irene Limbo - transfuse for Hgb < 7 - cont ASA '81mg'$  daily   History of hypothyroidism - cont Synthroid - TSH 4.142 on 01/03/22  R/o Dysphagia - pt denies sore throat but complains of getting orally getting choked on food if she does not eat slowly since June 2023 - SLP ordered  left foot wound, POA - site without erythema/ drainage, healing - continue preventive wound care - cont outpt wound therapy   OSA - pt reports hx  of but does not wear CPAP at home because she does not like nasal mask but company providing machine will not give a full face mask - cpap q HS prn   Fall at home - c/o of left hip pain- new, no noted complaints on initial ER eval.  Will get pelvic XR - PT   Best Practice (right click and "Reselect all SmartList Selections" daily)   Per primary  Labs   CBC: Recent Labs  Lab 01/02/22 2315 01/02/22 2344 01/03/22 0519 01/03/22 1206 01/04/22 0542  WBC 13.3*  --  13.8* 12.1* 13.6*  NEUTROABS 12.0*  --  12.2*  --   --   HGB 9.2* 10.5* 8.0* 8.1* 8.7*  HCT 32.3* 31.0* 27.7* 28.1* 30.1*  MCV 89.5  --  88.2 88.4 87.0  PLT 330  --  290 246 292     Basic Metabolic Panel: Recent Labs  Lab 01/02/22 2315 01/02/22 2344 01/03/22 0519  01/04/22 0542  NA 136 140 138 136  K 4.9 4.6 4.1 4.2  CL 110 109 111 109  CO2 16*  --  18* 21*  GLUCOSE 307* 309* 197* 144*  BUN 60* 64* 73* 75*  CREATININE 2.72* 2.70* 2.90* 3.04*  CALCIUM 8.7*  --  8.8* 8.9  MG 1.9  --  2.0 2.4  PHOS  --   --  4.0 5.2*    GFR: Estimated Creatinine Clearance: 12.9 mL/min (A) (by C-G formula based on SCr of 3.04 mg/dL (H)). Recent Labs  Lab 01/02/22 2315 01/03/22 0519 01/03/22 0804 01/03/22 1206 01/04/22 0252 01/04/22 0542  PROCALCITON  --  3.34 4.34  --  5.93  --   WBC 13.3* 13.8*  --  12.1*  --  13.6*  LATICACIDVEN 3.3* 2.3*  --   --   --   --      Liver Function Tests: Recent Labs  Lab 01/02/22 2315 01/03/22 0519  AST 32 22  ALT 20 21  ALKPHOS 56 50  BILITOT 0.8 1.1  PROT 6.4* 5.8*  ALBUMIN 3.7 3.4*    No results for input(s): "LIPASE", "AMYLASE" in the last 168 hours. No results for input(s): "AMMONIA" in the last 168 hours.  ABG    Component Value Date/Time   HCO3 19.1 (L) 01/03/2022 0519   TCO2 17 (L) 01/02/2022 2344   ACIDBASEDEF 5.3 (H) 01/03/2022 0519   O2SAT 69.4 01/03/2022 0519     Coagulation Profile: Recent Labs  Lab 01/02/22 2315 01/03/22 0519  INR 1.5* 1.7*      Cardiac Enzymes: No results for input(s): "CKTOTAL", "CKMB", "CKMBINDEX", "TROPONINI" in the last 168 hours.  HbA1C: Hgb A1c MFr Bld  Date/Time Value Ref Range Status  11/05/2021 12:00 AM 5.7 (H) <5.7 % of total Hgb Final    Comment:    For someone without known diabetes, a hemoglobin  A1c value between 5.7% and 6.4% is consistent with prediabetes and should be confirmed with a  follow-up test. . For someone with known diabetes, a value <7% indicates that their diabetes is well controlled. A1c targets should be individualized based on duration of diabetes, age, comorbid conditions, and other considerations. . This assay result is consistent with an increased risk of diabetes. . Currently, no consensus exists regarding use of hemoglobin A1c for diagnosis of diabetes for children. Marland Kitchen   10/14/2021 08:53 AM 5.6 4.8 - 5.6 % Final    Comment:    (NOTE) Pre diabetes:          5.7%-6.4%  Diabetes:              >6.4%  Glycemic control for   <7.0% adults with diabetes     CBG: Recent Labs  Lab 01/03/22 1226 01/03/22 1629 01/03/22 1717 01/03/22 2126 01/04/22 0802  GLUCAP 292* 139* 127* 200* 136*     Critical care time:  36 min.       Kennieth Rad, MSN, AG-ACNP-BC [facility] 01/04/2022, 10:04 AM  See Amion for pager If no response to pager, please call PCCM consult pager After 7:00 pm call Elink

## 2022-01-05 DIAGNOSIS — A419 Sepsis, unspecified organism: Secondary | ICD-10-CM | POA: Diagnosis not present

## 2022-01-05 DIAGNOSIS — E039 Hypothyroidism, unspecified: Secondary | ICD-10-CM | POA: Diagnosis not present

## 2022-01-05 DIAGNOSIS — J9601 Acute respiratory failure with hypoxia: Secondary | ICD-10-CM

## 2022-01-05 DIAGNOSIS — N184 Chronic kidney disease, stage 4 (severe): Secondary | ICD-10-CM

## 2022-01-05 DIAGNOSIS — D638 Anemia in other chronic diseases classified elsewhere: Secondary | ICD-10-CM | POA: Diagnosis not present

## 2022-01-05 DIAGNOSIS — R6520 Severe sepsis without septic shock: Secondary | ICD-10-CM

## 2022-01-05 DIAGNOSIS — I5032 Chronic diastolic (congestive) heart failure: Secondary | ICD-10-CM

## 2022-01-05 DIAGNOSIS — I10 Essential (primary) hypertension: Secondary | ICD-10-CM

## 2022-01-05 LAB — CBC
HCT: 26.5 % — ABNORMAL LOW (ref 36.0–46.0)
Hemoglobin: 8 g/dL — ABNORMAL LOW (ref 12.0–15.0)
MCH: 26 pg (ref 26.0–34.0)
MCHC: 30.2 g/dL (ref 30.0–36.0)
MCV: 86 fL (ref 80.0–100.0)
Platelets: 266 10*3/uL (ref 150–400)
RBC: 3.08 MIL/uL — ABNORMAL LOW (ref 3.87–5.11)
RDW: 21.9 % — ABNORMAL HIGH (ref 11.5–15.5)
WBC: 7.4 10*3/uL (ref 4.0–10.5)
nRBC: 0 % (ref 0.0–0.2)

## 2022-01-05 LAB — GLUCOSE, CAPILLARY
Glucose-Capillary: 152 mg/dL — ABNORMAL HIGH (ref 70–99)
Glucose-Capillary: 149 mg/dL — ABNORMAL HIGH (ref 70–99)
Glucose-Capillary: 171 mg/dL — ABNORMAL HIGH (ref 70–99)
Glucose-Capillary: 144 mg/dL — ABNORMAL HIGH (ref 70–99)

## 2022-01-05 LAB — PROCALCITONIN: Procalcitonin: 4.94 ng/mL

## 2022-01-05 LAB — CULTURE, BLOOD (ROUTINE X 2)
Special Requests: ADEQUATE
Special Requests: ADEQUATE

## 2022-01-05 LAB — BASIC METABOLIC PANEL
Anion gap: 8 (ref 5–15)
BUN: 79 mg/dL — ABNORMAL HIGH (ref 8–23)
CO2: 20 mmol/L — ABNORMAL LOW (ref 22–32)
Calcium: 8.6 mg/dL — ABNORMAL LOW (ref 8.9–10.3)
Chloride: 110 mmol/L (ref 98–111)
Creatinine, Ser: 3.06 mg/dL — ABNORMAL HIGH (ref 0.44–1.00)
GFR, Estimated: 14 mL/min — ABNORMAL LOW (ref >60)
Glucose, Bld: 164 mg/dL — ABNORMAL HIGH (ref 70–99)
Potassium: 4 mmol/L (ref 3.5–5.1)
Sodium: 138 mmol/L (ref 135–145)

## 2022-01-05 LAB — LACTIC ACID, PLASMA: Lactic Acid, Venous: 0.9 mmol/L (ref 0.5–1.9)

## 2022-01-05 MED ORDER — ONDANSETRON HCL 4 MG/2ML IJ SOLN
4.0000 mg | Freq: Four times a day (QID) | INTRAMUSCULAR | Status: DC | PRN
  Administered 2022-01-05: 4 mg via INTRAVENOUS
  Filled 2022-01-05: qty 2

## 2022-01-05 MED ORDER — TRAMADOL HCL 50 MG PO TABS
50.0000 mg | ORAL_TABLET | Freq: Two times a day (BID) | ORAL | Status: DC | PRN
  Administered 2022-01-05: 50 mg via ORAL
  Filled 2022-01-05: qty 1

## 2022-01-05 NOTE — Progress Notes (Signed)
PROGRESS NOTE    [name] [name]  JFH:[id number] DOB: 07/29/1932 DOA: 01/02/2022 PCP: Unk Pinto, MD   Brief Narrative: [name] [name] is a 88 y.o. female with history of diabetes mellitus type 2, anemia of chronic disease, acquired hypothyroidism, stage IV CKD, seizures, moderate aortic stenosis, chronic diastolic heart failure.  Patient presented secondary to severe sepsis with concern for HCAP.  Empiric antibiotics initiated.  During admission, patient developed severe hypotension requiring admission to the ICU and vasopressor support.  Work-up was significant for group B strep (strep agalactiae) bacteremia with unclear source.  Infectious ease was consulted with concern for possible endocarditis as source for recurrent GBS bacteremia.  Transthoracic Echocardiogram was negative for etiology.  Plan for transesophageal echocardiogram to complete work-up and direct antibiotic therapy.  Hypotension improved with vasopressor support in addition to midodrine.  Vasopressors discontinued and patient discharged from ICU on 11/18.   Assessment and Plan:  Septic shock Present on admission. Secondary to GBS bacteremia. Associated lactic acidosis of 3.3 and hypotension requiring ICU admission and vasopressor support. Empiric antibiotics started. Midodrine started for management of hypotension. Norepinephrine discontinued on 11/17 and patient transferred out of the ICU on 11/18.  GBS bacteremia Recurrent. Unclear source but possibly secondary to cellulitis vs endocarditis. ID consulted. Transthoracic Echocardiogram ordered on 11/17 and was not positive for vegetation.  -ID recommendations: Transesophageal Echocardiogram (planned for 11/21), Cefazolin IV  AKI on CKD stage IV AKI likely secondary to shock. Baseline creatinine of about 2. Creatinine of 2.72 on admission, now up to 9.37. complicated by acute urinary retention, although no hydronephrosis noted on CT imaging. -BMP in AM  Acute  urinary retention Unclear etiology. Foley catheter placed on 11/16.  Acute on chronic diastolic heart failure Recent Transthoracic Echocardiogram significant for an LVEF of 60-65% and grade II diastolic dysfunction.  Hypothyroidism -Continue synthroid  Normocytic anemia Hemoglobin currently stable. Patient follows with [facility].  Severe pulmonary hypertension Noted on Transthoracic Echocardiogram with associated moderate RV dilation and reduced function.  RV overload Suggestive from Transthoracic Echocardiogram. Patient started on Lasix trial. Possibly contributing to overall hypotension. Recent Transthoracic Echocardiogram from 09/2021 with normal RV EF. -Check d-dimer; if elevated, will need to obtain V/Q scan  Gallbladder wall thickening Noted on CT imaging. No correlative symptoms or labs.  Aortic stenosis Moderate-severe, noted on Transthoracic Echocardiogram.  Dizziness Per patient report when standing. Likely related to hypotension. -Continue PT -Orthostatic vitals  History of JAK2 positive myeloproliferative disorder Platelets stable. -Continue aspirin  DVT prophylaxis: SCDs Code Status:   Code Status: DNR Family Communication: None at bedside Disposition Plan: Discharge pending continued workup/management of bacteremia   Consultants:  PCCM Infectious disease  Procedures:  Transthoracic Echocardiogram  Antimicrobials: Vancomyicin Ceftriaxone Cefepime Azithromycin Cefazolin    Subjective: Patient continues to have some left abdominal/flank pain. Some nausea this morning. No other concerns.  Objective: BP (!) 119/30   Pulse 66   Temp 97.9 F (36.6 C) (Oral)   Resp 18   Ht '5\' 2"'$  (1.575 m)   Wt 81.6 kg   SpO2 93%   BMI 32.90 kg/m   Examination:  General exam: Appears calm and comfortable Respiratory system: Clear to auscultation. Respiratory effort normal. Cardiovascular system: S1 & S2 heard, RRR. 3/6 systolic murmur. Gastrointestinal system:  Abdomen is nondistended, soft and nontender. Normal bowel sounds heard. Central nervous system: Alert and oriented. No focal neurological deficits. Musculoskeletal: No edema. No calf tenderness Skin: No cyanosis. No rashes Psychiatry: Judgement and insight appear normal. Mood &  affect appropriate.    Data Reviewed: I have personally reviewed following labs and imaging studies  CBC Lab Results  Component Value Date   WBC 7.4 01/05/2022   RBC 3.08 (L) 01/05/2022   HGB 8.0 (L) 01/05/2022   HCT 26.5 (L) 01/05/2022   MCV 86.0 01/05/2022   MCH 26.0 01/05/2022   PLT 266 01/05/2022   MCHC 30.2 01/05/2022   RDW 21.9 (H) 01/05/2022   LYMPHSABS 0.3 (L) 01/03/2022   MONOABS 0.8 01/03/2022   EOSABS 0.1 01/03/2022   BASOSABS 0.0 54/62/7035     Last metabolic panel Lab Results  Component Value Date   NA 138 01/05/2022   K 4.0 01/05/2022   CL 110 01/05/2022   CO2 20 (L) 01/05/2022   BUN 79 (H) 01/05/2022   CREATININE 3.06 (H) 01/05/2022   GLUCOSE 164 (H) 01/05/2022   GFRNONAA 14 (L) 01/05/2022   GFRAA 28 (L) 05/05/2020   CALCIUM 8.6 (L) 01/05/2022   PHOS 5.2 (H) 01/04/2022   PROT 5.8 (L) 01/03/2022   ALBUMIN 3.4 (L) 01/03/2022   LABGLOB 2.4 05/03/2019   BILITOT 1.1 01/03/2022   ALKPHOS 50 01/03/2022   AST 22 01/03/2022   ALT 21 01/03/2022   ANIONGAP 8 01/05/2022    GFR: Estimated Creatinine Clearance: 12.8 mL/min (A) (by C-G formula based on SCr of 3.06 mg/dL (H)).  Recent Results (from the past 240 hour(s))  Blood Culture (routine x 2)     Status: Abnormal   Collection Time: 01/02/22 11:15 PM   Specimen: BLOOD  Result Value Ref Range Status   Specimen Description   Final    BLOOD RIGHT ANTECUBITAL Performed at [facility] [address] [place], [place] [address]    Special Requests   Final    BOTTLES DRAWN AEROBIC AND ANAEROBIC Blood Culture adequate volume Performed at [facility] [address] [place], [place]  [address]    Culture  Setup Time   Final    GRAM POSITIVE COCCI IN CHAINS IN BOTH AEROBIC AND ANAEROBIC BOTTLES CRITICAL RESULT CALLED TO, READ BACK BY AND VERIFIED WITH: PHARMD A PHAM [id number] AT 1340 BY CM Performed at [facility], [place] [address], [place], [place] [address]    Culture GROUP B STREP(S.AGALACTIAE)ISOLATED (A)  Final   Report Status 01/05/2022 FINAL  Final   Organism ID, Bacteria GROUP B STREP(S.AGALACTIAE)ISOLATED  Final      Susceptibility   Group b strep(s.agalactiae)isolated - MIC*    CLINDAMYCIN <=0.25 SENSITIVE Sensitive     AMPICILLIN <=0.25 SENSITIVE Sensitive     ERYTHROMYCIN <=0.12 SENSITIVE Sensitive     VANCOMYCIN 0.5 SENSITIVE Sensitive     CEFTRIAXONE <=0.12 SENSITIVE Sensitive     LEVOFLOXACIN 0.5 SENSITIVE Sensitive     PENICILLIN <=0.06 SENSITIVE Sensitive     * GROUP B STREP(S.AGALACTIAE)ISOLATED  Blood Culture ID Panel (Reflexed)     Status: Abnormal   Collection Time: 01/02/22 11:15 PM  Result Value Ref Range Status   Enterococcus faecalis NOT DETECTED NOT DETECTED Final   Enterococcus Faecium NOT DETECTED NOT DETECTED Final   Listeria monocytogenes NOT DETECTED NOT DETECTED Final   Staphylococcus species NOT DETECTED NOT DETECTED Final   Staphylococcus aureus (BCID) NOT DETECTED NOT DETECTED Final   Staphylococcus epidermidis NOT DETECTED NOT DETECTED Final   Staphylococcus lugdunensis NOT DETECTED NOT DETECTED Final   Streptococcus species DETECTED (A) NOT DETECTED Final    Comment: CRITICAL RESULT CALLED TO, READ BACK BY AND VERIFIED WITH:  PHARMD A PHAM [id number] AT 1340 BY CM    Streptococcus agalactiae DETECTED (A) NOT DETECTED Final    Comment: CRITICAL RESULT CALLED TO, READ BACK BY AND VERIFIED WITH: PHARMD A PHAM [id number] AT 8875 BY CM    Streptococcus pneumoniae NOT DETECTED NOT DETECTED Final   Streptococcus pyogenes NOT DETECTED NOT DETECTED Final   A.calcoaceticus-baumannii NOT DETECTED NOT DETECTED Final   Bacteroides fragilis  NOT DETECTED NOT DETECTED Final   Enterobacterales NOT DETECTED NOT DETECTED Final   Enterobacter cloacae complex NOT DETECTED NOT DETECTED Final   Escherichia coli NOT DETECTED NOT DETECTED Final   Klebsiella aerogenes NOT DETECTED NOT DETECTED Final   Klebsiella oxytoca NOT DETECTED NOT DETECTED Final   Klebsiella pneumoniae NOT DETECTED NOT DETECTED Final   Proteus species NOT DETECTED NOT DETECTED Final   Salmonella species NOT DETECTED NOT DETECTED Final   Serratia marcescens NOT DETECTED NOT DETECTED Final   Haemophilus influenzae NOT DETECTED NOT DETECTED Final   Neisseria meningitidis NOT DETECTED NOT DETECTED Final   Pseudomonas aeruginosa NOT DETECTED NOT DETECTED Final   Stenotrophomonas maltophilia NOT DETECTED NOT DETECTED Final   Candida albicans NOT DETECTED NOT DETECTED Final   Candida auris NOT DETECTED NOT DETECTED Final   Candida glabrata NOT DETECTED NOT DETECTED Final   Candida krusei NOT DETECTED NOT DETECTED Final   Candida parapsilosis NOT DETECTED NOT DETECTED Final   Candida tropicalis NOT DETECTED NOT DETECTED Final   Cryptococcus neoformans/gattii NOT DETECTED NOT DETECTED Final    Comment: Performed at [facility], [address] [place], [place] [address]  Blood Culture (routine x 2)     Status: Abnormal   Collection Time: 01/03/22 12:26 AM   Specimen: BLOOD  Result Value Ref Range Status   Specimen Description   Final    BLOOD BLOOD RIGHT HAND Performed at [facility] [address] [place], [place] [address]    Special Requests   Final    BOTTLES DRAWN AEROBIC AND ANAEROBIC Blood Culture adequate volume Performed at [facility] [address], [place], [place] [address]    Culture  Setup Time   Final    GRAM POSITIVE COCCI IN CHAINS IN BOTH AEROBIC AND ANAEROBIC BOTTLES CRITICAL RESULT CALLED TO, READ BACK BY AND VERIFIED WITH: PHARMD CRYSTAL ROBERTSON ON 01/03/22 @ 1557 BY DRT    Culture (A)   Final    GROUP B STREP(S.AGALACTIAE)ISOLATED SUSCEPTIBILITIES PERFORMED ON PREVIOUS CULTURE WITHIN THE LAST 5 DAYS. Performed at [facility], [place] [address] [place], [place] [address]    Report Status 01/05/2022 FINAL  Final  Resp Panel by RT-PCR (Flu A&B, Covid) Anterior Nasal Swab     Status: None   Collection Time: 01/03/22 12:31 AM   Specimen: Anterior Nasal Swab  Result Value Ref Range Status   SARS Coronavirus 2 by RT PCR NEGATIVE NEGATIVE Final    Comment: (NOTE) SARS-CoV-2 target nucleic acids are NOT DETECTED.  The SARS-CoV-2 RNA is generally detectable in upper respiratory specimens during the acute phase of infection. The lowest concentration of SARS-CoV-2 viral copies this assay can detect is 138 copies/mL. A negative result does not preclude SARS-Cov-2 infection and should not be used as the sole basis for treatment or other patient management decisions. A negative result may occur with  improper specimen collection/handling, submission of specimen other than nasopharyngeal swab, presence of viral mutation(s) within the areas targeted by this assay, and inadequate number of viral copies(<138  copies/mL). A negative result must be combined with clinical observations, patient history, and epidemiological information. The expected result is Negative.  Fact Sheet for Patients:  EntrepreneurPulse.com.au  Fact Sheet for Healthcare Providers:  IncredibleEmployment.be  This test is no t yet approved or cleared by the [place] FDA and  has been authorized for detection and/or diagnosis of SARS-CoV-2 by FDA under an Emergency Use Authorization (EUA). This EUA will remain  in effect (meaning this test can be used) for the duration of the COVID-19 declaration under Section 564(b)(1) of the Act, 21 U.S.C.section 360bbb-3(b)(1), unless the authorization is terminated  or revoked sooner.       Influenza A by PCR NEGATIVE NEGATIVE  Final   Influenza B by PCR NEGATIVE NEGATIVE Final    Comment: (NOTE) The Xpert Xpress SARS-CoV-2/FLU/RSV plus assay is intended as an aid in the diagnosis of influenza from Nasopharyngeal swab specimens and should not be used as a sole basis for treatment. Nasal washings and aspirates are unacceptable for Xpert Xpress SARS-CoV-2/FLU/RSV testing.  Fact Sheet for Patients: EntrepreneurPulse.com.au  Fact Sheet for Healthcare Providers: IncredibleEmployment.be  This test is not yet approved or cleared by the [place] FDA and has been authorized for detection and/or diagnosis of SARS-CoV-2 by FDA under an Emergency Use Authorization (EUA). This EUA will remain in effect (meaning this test can be used) for the duration of the COVID-19 declaration under Section 564(b)(1) of the Act, 21 U.S.C. section 360bbb-3(b)(1), unless the authorization is terminated or revoked.  Performed at [facility], [place] [address] [place], [place] [address]   MRSA Next Gen by PCR, Nasal     Status: Abnormal   Collection Time: 01/03/22  2:50 AM   Specimen: Nasal Mucosa; Nasal Swab  Result Value Ref Range Status   MRSA by PCR Next Gen DETECTED (A) NOT DETECTED Final    Comment: (NOTE) The GeneXpert MRSA Assay (FDA approved for NASAL specimens only), is one component of a comprehensive MRSA colonization surveillance program. It is not intended to diagnose MRSA infection nor to guide or monitor treatment for MRSA infections. Test performance is not FDA approved in patients less than 52 years old. Performed at [facility], [place] [address] [place],  [address]   Respiratory (~20 pathogens) panel by PCR     Status: None   Collection Time: 01/03/22  9:00 AM   Specimen: Nasopharyngeal Swab; Respiratory  Result Value Ref Range Status   Adenovirus NOT DETECTED NOT DETECTED Final   Coronavirus 229E NOT DETECTED NOT DETECTED  Final    Comment: (NOTE) The Coronavirus on the Respiratory Panel, DOES NOT test for the novel  Coronavirus (2019 nCoV)    Coronavirus HKU1 NOT DETECTED NOT DETECTED Final   Coronavirus NL63 NOT DETECTED NOT DETECTED Final   Coronavirus OC43 NOT DETECTED NOT DETECTED Final   Metapneumovirus NOT DETECTED NOT DETECTED Final   Rhinovirus / Enterovirus NOT DETECTED NOT DETECTED Final   Influenza A NOT DETECTED NOT DETECTED Final   Influenza B NOT DETECTED NOT DETECTED Final   Parainfluenza Virus 1 NOT DETECTED NOT DETECTED Final   Parainfluenza Virus 2 NOT DETECTED NOT DETECTED Final   Parainfluenza Virus 3 NOT DETECTED NOT DETECTED Final   Parainfluenza Virus 4 NOT DETECTED NOT DETECTED Final   Respiratory Syncytial Virus NOT DETECTED NOT DETECTED Final   Bordetella pertussis NOT DETECTED NOT DETECTED Final   Bordetella Parapertussis NOT DETECTED NOT DETECTED Final   Chlamydophila pneumoniae NOT DETECTED NOT DETECTED  Final   Mycoplasma pneumoniae NOT DETECTED NOT DETECTED Final    Comment: Performed at [facility], [place] [address], [place], [place] [address]  Urine Culture     Status: None   Collection Time: 01/03/22  9:48 AM   Specimen: Urine, Clean Catch  Result Value Ref Range Status   Specimen Description   Final    URINE, CLEAN CATCH Performed at [facility], [place] [address], [place], [place] [address]    Special Requests   Final    NONE Performed at [facility], [place] [address], [place], [place] [address]    Culture   Final    NO GROWTH Performed at [facility], [place] [address], [place], [place] [address]    Report Status 01/04/2022 FINAL  Final  Culture, blood (Routine X 2) w Reflex to ID Panel     Status: None (Preliminary result)   Collection Time: 01/04/22  7:36 AM   Specimen: BLOOD  Result Value Ref Range Status   Specimen Description   Final    BLOOD BLOOD LEFT ARM Performed at [facility] [address] [place] [address], [place], [place] [address]    Special Requests   Final    BOTTLES DRAWN AEROBIC ONLY Blood Culture adequate volume Performed at [facility] [address] [place], [place] [address]    Culture   Final    NO GROWTH < 24 HOURS Performed at [facility] [address] [place], [place] [address]    Report Status PENDING  Incomplete  Culture, blood (Routine X 2) w Reflex to ID Panel     Status: None (Preliminary result)   Collection Time: 01/04/22  7:46 AM   Specimen: BLOOD  Result Value Ref Range Status   Specimen Description   Final    BLOOD BLOOD RIGHT HAND Performed at [facility] [address], [place], [place] [address]    Special Requests   Final    BOTTLES DRAWN AEROBIC ONLY Blood Culture adequate volume Performed at [facility] [address] [place], [place] [address]    Culture   Final    NO GROWTH < 24 HOURS Performed at [facility] [address] [place],  [address]    Report Status PENDING  Incomplete      Radiology Studies: CT ABDOMEN PELVIS WO CONTRAST  Result Date: 01/04/2022 CLINICAL DATA:  Left upper quadrant pain EXAM: CT ABDOMEN AND PELVIS WITHOUT CONTRAST TECHNIQUE: Multidetector CT imaging of the abdomen and pelvis was performed following the standard protocol without IV contrast. RADIATION DOSE REDUCTION: This exam was performed according to the departmental dose-optimization program which includes automated exposure control, adjustment of the mA and/or kV according to patient size and/or use of iterative reconstruction technique. COMPARISON:  None Available. FINDINGS: Lower chest: Small to moderate bilateral pleural effusions. Compressive atelectasis in the lower lobes. Small hiatal hernia. Hepatobiliary: Gallbladder is contracted. There appears to be gallbladder wall thickening. No visible ductal dilatation or focal hepatic abnormality. Pancreas: No focal  abnormality or ductal dilatation. Spleen: Splenomegaly with a craniocaudal length of 17 cm. Adrenals/Urinary Tract: Bilateral cortical thinning. No stones or hydronephrosis. Adrenal glands unremarkable. Urinary bladder decompressed with Foley catheter in place. Stomach/Bowel: Moderate stool burden. Stomach, large and small bowel grossly unremarkable. Vascular/Lymphatic: Aortic atherosclerosis. No evidence of aneurysm or adenopathy. Reproductive: Uterus and adnexa unremarkable.  No mass. Other: No free fluid or free  air. Musculoskeletal: No acute bony abnormality. IMPRESSION: Small to moderate bilateral pleural effusions. Compressive atelectasis in the lower lobes. Small hiatal hernia. Splenomegaly. Gallbladder is contracted. There appears to be gallbladder wall thickening. This could be further evaluated with right upper quadrant ultrasound if felt clinically indicated. Aortic atherosclerosis. Electronically Signed   By: Rolm Baptise M.D.   On: 01/04/2022 21:15   ECHOCARDIOGRAM COMPLETE  Result Date: 01/04/2022    ECHOCARDIOGRAM REPORT   Patient Name:   [name] [name] Date of Exam: 01/04/2022 Medical Rec #:  [id number]        Height:       62.0 in Accession #:    [id number]       Weight:       180.1 lb Date of Birth:  23-Feb-1932       BSA:          1.828 m Patient Age:    25 years         BP:           123/33 mmHg Patient Gender: F                HR:           62 bpm. Exam Location:  Inpatient Procedure: 2D Echo, Cardiac Doppler and Color Doppler Indications:    Bacteremia  History:        Patient has prior history of Echocardiogram examinations, most                 recent 10/14/2021. CHF, Aortic Valve Disease,                 Signs/Symptoms:Bacteremia; Risk Factors:Diabetes, Sleep Apnea                 and Dyslipidemia. Aortic stenosis.  Sonographer:    Roseanna Rainbow RDCS Referring Phys: [id number] [facility]  Sonographer Comments: Patient is obese. Image acquisition challenging due to patient body habitus. RN  stated central line is present IMPRESSIONS  1. RV is now moderately dilated with moderately reduced function. Severe pHTN is present. Aortic stenosis is moderate to severe. Central catheter seen in RA. No obvious vegetation. TEE recommended if clinically inidicated.  2. Left ventricular ejection fraction, by estimation, is 60 to 65%. The left ventricle has normal function. The left ventricle has no regional wall motion abnormalities. There is mild concentric left ventricular hypertrophy. Left ventricular diastolic parameters are consistent with Grade II diastolic dysfunction (pseudonormalization). The E/e' is 32.4. There is the interventricular septum is flattened in systole, consistent with right ventricular pressure overload.  3. Right ventricular systolic function is moderately reduced. The right ventricular size is moderately enlarged. There is severely elevated pulmonary artery systolic pressure. The estimated right ventricular systolic pressure is 79.0 mmHg.  4. Right atrial size was mild to moderately dilated.  5. The mitral valve is grossly normal. Trivial mitral valve regurgitation. No evidence of mitral stenosis.  6. Tricuspid valve regurgitation is mild to moderate.  7. The aortic valve is tricuspid. Aortic valve regurgitation is not visualized. Moderate to severe aortic valve stenosis. Aortic valve area, by VTI measures 1.02 cm. Aortic valve mean gradient measures 36.0 mmHg. Aortic valve Vmax measures 3.89 m/s.  8. The inferior vena cava is dilated in size with <50% respiratory variability, suggesting right atrial pressure of 15 mmHg. Comparison(s): Changes from prior study are noted. FINDINGS  Left Ventricle: Left ventricular ejection fraction, by estimation, is 60 to 65%. The left ventricle has  normal function. The left ventricle has no regional wall motion abnormalities. The left ventricular internal cavity size was normal in size. There is  mild concentric left ventricular hypertrophy. The  interventricular septum is flattened in systole, consistent with right ventricular pressure overload. Left ventricular diastolic parameters are consistent with Grade II diastolic dysfunction (pseudonormalization). The E/e' is 32.4. Right Ventricle: The right ventricular size is moderately enlarged. No increase in right ventricular wall thickness. Right ventricular systolic function is moderately reduced. There is severely elevated pulmonary artery systolic pressure. The tricuspid regurgitant velocity is 4.57 m/s, and with an assumed right atrial pressure of 15 mmHg, the estimated right ventricular systolic pressure is 38.7 mmHg. Left Atrium: Left atrial size was normal in size. Right Atrium: Right atrial size was mild to moderately dilated. Pericardium: Trivial pericardial effusion is present. Mitral Valve: The mitral valve is grossly normal. Trivial mitral valve regurgitation. No evidence of mitral valve stenosis. MV peak gradient, 10.4 mmHg. The mean mitral valve gradient is 2.5 mmHg. Tricuspid Valve: The tricuspid valve is grossly normal. Tricuspid valve regurgitation is mild to moderate. No evidence of tricuspid stenosis. Aortic Valve: The aortic valve is tricuspid. Aortic valve regurgitation is not visualized. Moderate to severe aortic stenosis is present. Aortic valve mean gradient measures 36.0 mmHg. Aortic valve peak gradient measures 60.5 mmHg. Aortic valve area, by VTI measures 1.02 cm. Pulmonic Valve: The pulmonic valve was grossly normal. Pulmonic valve regurgitation is trivial. No evidence of pulmonic stenosis. Aorta: The aortic root and ascending aorta are structurally normal, with no evidence of dilitation. Venous: The inferior vena cava is dilated in size with less than 50% respiratory variability, suggesting right atrial pressure of 15 mmHg. IAS/Shunts: The atrial septum is grossly normal. Additional Comments: A venous catheter is visualized in the right atrium.  LEFT VENTRICLE PLAX 2D LVIDd:          4.10 cm     Diastology LVIDs:         2.53 cm     LV e' medial:    4.50 cm/s LV PW:         1.30 cm     LV E/e' medial:  32.4 LV IVS:        1.13 cm     LV e' lateral:   3.45 cm/s LVOT diam:     2.13 cm     LV E/e' lateral: 42.3 LV SV:         95 LV SV Index:   52 LVOT Area:     3.56 cm  LV Volumes (MOD) LV vol d, MOD A2C: 55.1 ml LV vol d, MOD A4C: 50.7 ml LV vol s, MOD A2C: 23.0 ml LV vol s, MOD A4C: 16.5 ml LV SV MOD A2C:     32.1 ml LV SV MOD A4C:     50.7 ml LV SV MOD BP:      35.5 ml RIGHT VENTRICLE            IVC RV S prime:     8.03 cm/s  IVC diam: 2.30 cm TAPSE (M-mode): 1.7 cm LEFT ATRIUM             Index        RIGHT ATRIUM           Index LA diam:        4.30 cm 2.35 cm/m   RA Area:     16.90 cm LA Vol (A2C):   29.8 ml 16.30 ml/m  RA  Volume:   46.70 ml  25.54 ml/m LA Vol (A4C):   52.5 ml 28.71 ml/m LA Biplane Vol: 41.8 ml 22.86 ml/m  AORTIC VALVE AV Area (Vmax):    0.99 cm AV Area (Vmean):   0.94 cm AV Area (VTI):     1.02 cm AV Vmax:           389.00 cm/s AV Vmean:          282.000 cm/s AV VTI:            0.940 m AV Peak Grad:      60.5 mmHg AV Mean Grad:      36.0 mmHg LVOT Vmax:         108.00 cm/s LVOT Vmean:        74.200 cm/s LVOT VTI:          0.268 m LVOT/AV VTI ratio: 0.29  AORTA Ao Root diam: 2.55 cm Ao Asc diam:  2.80 cm MITRAL VALVE                TRICUSPID VALVE MV Area (PHT): 4.68 cm     TR Peak grad:   83.5 mmHg MV Peak grad:  10.4 mmHg    TR Vmax:        457.00 cm/s MV Mean grad:  2.5 mmHg MV Vmax:       1.62 m/s     SHUNTS MV Vmean:      73.8 cm/s    Systemic VTI:  0.27 m MV Decel Time: 162 msec     Systemic Diam: 2.13 cm MV E velocity: 146.00 cm/s MV A velocity: 106.00 cm/s MV E/A ratio:  1.38 Eleonore Chiquito MD Electronically signed by Eleonore Chiquito MD Signature Date/Time: 01/04/2022/10:43:29 AM    Final    DG Chest Port 1 View  Result Date: 01/03/2022 CLINICAL DATA:  [id number]. Encounter for central line placement. History of hypertension. EXAM: PORTABLE CHEST 1 VIEW  COMPARISON:  Chest CT without contrast yesterday at 11:23 p.m. FINDINGS: 7:40 p.m. There is cardiomegaly and mild central vascular prominence. Vascular asymmetry of the right hilum. Tortuosity and calcification of the aorta noted with otherwise unremarkable mediastinum. There is no overt pulmonary edema. There are small pleural effusions with hazy atelectasis or infiltrate in the base of both lungs. The lungs are otherwise generally clear. There is overlying monitor wiring. Newly noted right IJ central line in place, the tip in the upper right atrium. No pneumothorax. Thoracic spondylosis. IMPRESSION: 1. Right IJ central line tip in the upper right atrium. No pneumothorax. 2. Cardiomegaly with mild central vascular prominence. 3. Small pleural effusions with hazy atelectasis or infiltrate in the base of both lungs. 4. Aortic atherosclerosis. Electronically Signed   By: Telford Nab M.D.   On: 01/03/2022 20:10   [place] EKG SITE RITE  Result Date: 01/03/2022 If Site Rite image not attached, placement could not be confirmed due to current cardiac rhythm.     LOS: 2 days    Cordelia Poche, MD [facility] 01/05/2022, 7:09 AM   If 7PM-7AM, please contact night-coverage [email or web address]

## 2022-01-05 NOTE — Hospital Course (Signed)
[name] [name] is a 88 y.o. female with history of diabetes mellitus type 2, anemia of chronic disease, acquired hypothyroidism, stage IV CKD, seizures, moderate aortic stenosis, chronic diastolic heart failure.  Patient presented secondary to severe sepsis with concern for HCAP.  Empiric antibiotics initiated.  During admission, patient developed severe hypotension requiring admission to the ICU and vasopressor support.  Work-up was significant for group B strep (strep agalactiae) bacteremia with unclear source.  Infectious ease was consulted with concern for possible endocarditis as source for recurrent GBS bacteremia.  Transthoracic Echocardiogram was negative for etiology.  Plan for transesophageal echocardiogram to complete work-up and direct antibiotic therapy.  Hypotension improved with vasopressor support in addition to midodrine.  Vasopressors discontinued and patient discharged from ICU on 11/18.

## 2022-01-05 NOTE — Progress Notes (Signed)
Patient refuses CPAP. Patient states they do not get along so she through her machine in the trash. Encouraged patient to call if she changes her mind.

## 2022-01-05 NOTE — Evaluation (Signed)
Clinical/Bedside Swallow Evaluation Patient Details  Name: [name] [name] MRN: [id number] Date of Birth: 1932-05-22  Today's Date: 01/05/2022 Time: SLP Start Time (ACUTE ONLY): 1645 SLP Stop Time (ACUTE ONLY): 1700 SLP Time Calculation (min) (ACUTE ONLY): 15 min  Past Medical History:  Past Medical History:  Diagnosis Date   Anemia, unspecified    Arthritis    "knees; right shoulder" (09/15/2013)   Basal cell carcinoma    "right upper outer lip"   DM neuropathy, type II diabetes mellitus ([place])    Dyspnea 2021   with low iron   GERD (gastroesophageal reflux disease)    Gout    High cholesterol    Hypertension    Hypothyroidism    Meniere's disease    Obesity (BMI 30-39.9)    Other forms of epilepsy and recurrent seizures without mention of intractable epilepsy 11/04/2012   Non convulsive paroxysmal spells, responding to [facility], patient not driving.    Pneumonia ~ 1943   Skin cancer    "forehead; right hand"   Stage 4 chronic kidney disease ([place])    UTI (urinary tract infection)    Vitamin D deficiency    Past Surgical History:  Past Surgical History:  Procedure Laterality Date   BIOPSY  04/05/2020   Procedure: BIOPSY;  Surgeon: Jerene Bears, MD;  Location: [facility];  Service: Gastroenterology;;   CERVICAL POLYPECTOMY     COLONOSCOPY WITH PROPOFOL N/A 04/06/2020   Procedure: COLONOSCOPY WITH PROPOFOL;  Surgeon: Jerene Bears, MD;  Location: [facility];  Service: Gastroenterology;  Laterality: N/A;   ESOPHAGOGASTRODUODENOSCOPY (EGD) WITH PROPOFOL N/A 04/05/2020   Procedure: ESOPHAGOGASTRODUODENOSCOPY (EGD) WITH PROPOFOL;  Surgeon: Jerene Bears, MD;  Location: [facility];  Service: Gastroenterology;  Laterality: N/A;   HAMMER TOE SURGERY Left 1980's   MOHS SURGERY  20087   "right upper outer lip"   POLYPECTOMY  04/06/2020   Procedure: POLYPECTOMY;  Surgeon: Jerene Bears, MD;  Location: [facility];  Service: Gastroenterology;;   TONSILLECTOMY  ~ 1947   WRIST  SURGERY Left ~ 1950   "ran arm thru window"   HPI:  Patient is an 88 y.o. female with PMH: DM-2, anemia of chronic disease, acquired hypothyroidism, CKD stage IV, seizures, moderate aortic stenosis, CHF. She presented to the hospital on 01/02/22 secondary to severe sepsis with concern for HCAP. During admission she developed severe hypotension requiring admission to the ICU and vasopressor support. Infections disease consulted with concern for possible endocarditis as source of her recurrent GBS bacteremia. SLP ordered to evaluated swallow secondary to patient c/o of oral dysphagia since June of this year.    Assessment / Plan / Recommendation  Clinical Impression  Patient is not currently presenting with clinical s/s of dysphagia as per this bedside swallow evaluation. She consumed successive straw sips of thin liquids (water) without overt s/s and with timely swallow initiation, no change in voice or vitals.Patient reported that her issues seem to stem from dry mouth caused by her mouth breathing when she sleeps, etc. She avoids or reduces intake of PO's that are difficult for her to swallow (such as breads) but denies any significant episodes of dysphagia and denies any significant GERD symptoms. Patient is fully aware of swallow safety precautions for what SLP suspects is likely presbyesophagus. SLP provided additional education but not recommending further skilled intervention. SLP Visit Diagnosis: Dysphagia, unspecified (R13.10)    Aspiration Risk  No limitations;Mild aspiration risk    Diet Recommendation Regular;Thin liquid   Liquid Administration  via: Straw;Cup Medication Administration: Whole meds with liquid Supervision: Patient able to self feed Compensations: Slow rate;Small sips/bites Postural Changes: Seated upright at 90 degrees;Remain upright for at least 30 minutes after po intake    Other  Recommendations Oral Care Recommendations: Oral care BID    Recommendations for  follow up therapy are one component of a multi-disciplinary discharge planning process, led by the attending physician.  Recommendations may be updated based on patient status, additional functional criteria and insurance authorization.  Follow up Recommendations No SLP follow up      Assistance Recommended at Discharge    Functional Status Assessment Patient has not had a recent decline in their functional status  Frequency and Duration   N/A         Prognosis Prognosis for Safe Diet Advancement: Good      Swallow Study   General Date of Onset: 01/04/22 HPI: Patient is an 88 y.o. female with PMH: DM-2, anemia of chronic disease, acquired hypothyroidism, CKD stage IV, seizures, moderate aortic stenosis, CHF. She presented to the hospital on 01/02/22 secondary to severe sepsis with concern for HCAP. During admission she developed severe hypotension requiring admission to the ICU and vasopressor support. Infections disease consulted with concern for possible endocarditis as source of her recurrent GBS bacteremia. SLP ordered to evaluated swallow secondary to patient c/o of oral dysphagia since June of this year. Type of Study: Bedside Swallow Evaluation Previous Swallow Assessment: none found Diet Prior to this Study: Regular;Thin liquids Temperature Spikes Noted: No Respiratory Status: Nasal cannula History of Recent Intubation: No Behavior/Cognition: Pleasant mood;Alert;Cooperative Oral Cavity Assessment: Within Functional Limits Oral Care Completed by SLP: No Oral Cavity - Dentition: Adequate natural dentition Vision: Functional for self-feeding Self-Feeding Abilities: Able to feed self Patient Positioning: Upright in bed Baseline Vocal Quality: Normal Volitional Cough: Strong Volitional Swallow: Able to elicit    Oral/Motor/Sensory Function Overall Oral Motor/Sensory Function: Within functional limits   Ice Chips     Thin Liquid Thin Liquid: Within functional  limits Presentation: Straw;Self Fed    Nectar Thick     Honey Thick     Puree Puree: Not tested   Solid     Solid: Not tested      Sonia Baller, MA, CCC-SLP Speech Therapy

## 2022-01-05 NOTE — Progress Notes (Addendum)
[place] Progress Note Patient Name: [name] [name] DOB: Apr 05, 1932 MRN: [id number]   Date of Service  01/05/2022  HPI/Events of Note  Notified of left sided flank pain graded 8/10 which is not new, but no relieved by tylenol.  GFR <15.  Lactic acid 0.9.  eICU Interventions  Trial of tramadol '50mg'$  q12hrs PRN given renal dysfunction.  Close monitoring as this can also lower seizure threshold.      Intervention Category Intermediate Interventions: Pain - evaluation and management  [name] 01/05/2022, 6:47 AM

## 2022-01-05 NOTE — Progress Notes (Signed)
[place] Progress Note Patient Name: [name] [name] DOB: 1932/06/25 MRN: [id number]   Date of Service  01/05/2022  HPI/Events of Note  Notified of low diastolic blood pressure.  SBP >648 but diastolic pressure has been historically low.   Pt was weaned off levophed earlier and started on midodrine.   BP 114/25, HR 72, RR 19.  eICU Interventions  Continue to monitor and keep SBP >100.  Add on lactate to AM labs to ensure patient is perfusing adequately.      Intervention Category Intermediate Interventions: Hypotension - evaluation and management  [name] 01/05/2022, 1:53 AM

## 2022-01-06 DIAGNOSIS — E119 Type 2 diabetes mellitus without complications: Secondary | ICD-10-CM

## 2022-01-06 DIAGNOSIS — Z794 Long term (current) use of insulin: Secondary | ICD-10-CM

## 2022-01-06 DIAGNOSIS — I350 Nonrheumatic aortic (valve) stenosis: Secondary | ICD-10-CM | POA: Diagnosis not present

## 2022-01-06 DIAGNOSIS — J9601 Acute respiratory failure with hypoxia: Secondary | ICD-10-CM | POA: Diagnosis not present

## 2022-01-06 DIAGNOSIS — A419 Sepsis, unspecified organism: Secondary | ICD-10-CM | POA: Diagnosis not present

## 2022-01-06 DIAGNOSIS — E039 Hypothyroidism, unspecified: Secondary | ICD-10-CM | POA: Diagnosis not present

## 2022-01-06 LAB — BASIC METABOLIC PANEL
Anion gap: 7 (ref 5–15)
BUN: 82 mg/dL — ABNORMAL HIGH (ref 8–23)
CO2: 21 mmol/L — ABNORMAL LOW (ref 22–32)
Calcium: 8.8 mg/dL — ABNORMAL LOW (ref 8.9–10.3)
Chloride: 109 mmol/L (ref 98–111)
Creatinine, Ser: 2.87 mg/dL — ABNORMAL HIGH (ref 0.44–1.00)
GFR, Estimated: 15 mL/min — ABNORMAL LOW (ref >60)
Glucose, Bld: 128 mg/dL — ABNORMAL HIGH (ref 70–99)
Potassium: 5 mmol/L (ref 3.5–5.1)
Sodium: 137 mmol/L (ref 135–145)

## 2022-01-06 LAB — GLUCOSE, CAPILLARY
Glucose-Capillary: 127 mg/dL — ABNORMAL HIGH (ref 70–99)
Glucose-Capillary: 233 mg/dL — ABNORMAL HIGH (ref 70–99)
Glucose-Capillary: 116 mg/dL — ABNORMAL HIGH (ref 70–99)
Glucose-Capillary: 168 mg/dL — ABNORMAL HIGH (ref 70–99)

## 2022-01-06 LAB — D-DIMER, QUANTITATIVE: D-Dimer, Quant: 0.52 ug/mL-FEU — ABNORMAL HIGH (ref 0.00–0.50)

## 2022-01-06 MED ORDER — LACTULOSE 10 GM/15ML PO SOLN
20.0000 g | Freq: Every day | ORAL | Status: DC | PRN
  Administered 2022-01-06 – 2022-01-10 (×2): 20 g via ORAL
  Filled 2022-01-06 (×2): qty 30

## 2022-01-06 MED ORDER — BISACODYL 5 MG PO TBEC
5.0000 mg | DELAYED_RELEASE_TABLET | Freq: Once | ORAL | Status: AC
  Administered 2022-01-06: 5 mg via ORAL
  Filled 2022-01-06: qty 1

## 2022-01-06 MED ORDER — HYDROCORTISONE (PERIANAL) 2.5 % EX CREA
TOPICAL_CREAM | Freq: Two times a day (BID) | CUTANEOUS | Status: DC
  Filled 2022-01-06: qty 28.35

## 2022-01-06 NOTE — Progress Notes (Signed)
PROGRESS NOTE    JOURNII NIERMAN  EGB:[id number] DOB: Dec 07, 1932 DOA: 01/02/2022 PCP: Unk Pinto, MD   Brief Narrative: [name] [name] is a 88 y.o. female with history of diabetes mellitus type 2, anemia of chronic disease, acquired hypothyroidism, stage IV CKD, seizures, moderate aortic stenosis, chronic diastolic heart failure.  Patient presented secondary to severe sepsis with concern for HCAP.  Empiric antibiotics initiated.  During admission, patient developed severe hypotension requiring admission to the ICU and vasopressor support.  Work-up was significant for group B strep (strep agalactiae) bacteremia with unclear source.  Infectious ease was consulted with concern for possible endocarditis as source for recurrent GBS bacteremia.  Transthoracic Echocardiogram was negative for etiology.  Plan for transesophageal echocardiogram to complete work-up and direct antibiotic therapy.  Hypotension improved with vasopressor support in addition to midodrine.  Vasopressors discontinued and patient discharged from ICU on 11/18.   Assessment and Plan:  Septic shock Present on admission. Secondary to GBS bacteremia. Associated lactic acidosis of 3.3 and hypotension requiring ICU admission and vasopressor support. Empiric antibiotics started. Midodrine started for management of hypotension. Norepinephrine discontinued on 11/17 and patient transferred out of the ICU on 11/18.  GBS bacteremia Recurrent. Unclear source but possibly secondary to cellulitis vs endocarditis. ID consulted. Transthoracic Echocardiogram ordered on 11/17 and was not positive for vegetation.  -ID recommendations: Transesophageal Echocardiogram (planned for 11/21), Cefazolin IV  AKI on CKD stage IV AKI likely secondary to shock. Baseline creatinine of about 2. Creatinine of 2.72 on admission, up to a peak of 3.71. complicated by acute urinary retention, although no hydronephrosis noted on CT imaging. Creatinine  improved to 2.87 today. -BMP in AM  Acute urinary retention Unclear etiology. Foley catheter placed on 11/16.  Acute on chronic diastolic heart failure Recent Transthoracic Echocardiogram significant for an LVEF of 60-65% and grade II diastolic dysfunction.  Hypothyroidism -Continue synthroid  Normocytic anemia Hemoglobin currently stable. Patient follows with Irene [facility]bo.  Severe pulmonary hypertension Noted on Transthoracic Echocardiogram with associated moderate RV dilation and reduced function.  RV overload Suggestive from Transthoracic Echocardiogram. Patient started on Lasix trial. Possibly contributing to overall hypotension. Recent Transthoracic Echocardiogram from 09/2021 with normal RV EF. D-dimer elevated but low risk when adjusted for age. Patient will need cardiology follow-up.  Gallbladder wall thickening Noted on CT imaging. No correlative symptoms or labs.  Aortic stenosis Moderate-severe, noted on Transthoracic Echocardiogram.  Dizziness Per patient report when standing. Likely related to hypotension. -Continue PT  History of JAK2 positive myeloproliferative disorder Platelets stable. -Continue aspirin  DVT prophylaxis: SCDs Code Status:   Code Status: DNR Family Communication: None at bedside Disposition Plan: Discharge pending continued workup/management of bacteremia. Likely discharge as early as 11/22   Consultants:  PCCM Infectious disease  Procedures:  Transthoracic Echocardiogram  Antimicrobials: Vancomyicin Ceftriaxone Cefepime Azithromycin Cefazolin    Subjective: No issues overnight. No concerns this morning.  Objective: BP (!) 115/52 (BP Location: Right Arm)   Pulse 68   Temp (!) 97.5 F (36.4 C) (Oral)   Resp 18   Ht '5\' 2"'$  (1.575 m)   Wt 84.3 kg   SpO2 (!) 89%   BMI 33.99 kg/m   Examination:  General exam: Appears calm and comfortable Respiratory system: Clear to auscultation. Respiratory effort normal. Cardiovascular  system: S1 & S2 heard, RRR. 3/6 systolic murmur Gastrointestinal system: Abdomen is nondistended, soft and mildly tender in LLQ. Slightly decreased bowel sounds heard. Central nervous system: Alert and oriented. No focal neurological deficits. Musculoskeletal:  No calf tenderness Skin: No cyanosis. No rashes Psychiatry: Judgement and insight appear normal. Mood & affect appropriate.    Data Reviewed: I have personally reviewed following labs and imaging studies  CBC Lab Results  Component Value Date   WBC 7.4 01/05/2022   RBC 3.08 (L) 01/05/2022   HGB 8.0 (L) 01/05/2022   HCT 26.5 (L) 01/05/2022   MCV 86.0 01/05/2022   MCH 26.0 01/05/2022   PLT 266 01/05/2022   MCHC 30.2 01/05/2022   RDW 21.9 (H) 01/05/2022   LYMPHSABS 0.3 (L) 01/03/2022   MONOABS 0.8 01/03/2022   EOSABS 0.1 01/03/2022   BASOSABS 0.0 40/09/6759     Last metabolic panel Lab Results  Component Value Date   NA 137 01/06/2022   K 5.0 01/06/2022   CL 109 01/06/2022   CO2 21 (L) 01/06/2022   BUN 82 (H) 01/06/2022   CREATININE 2.87 (H) 01/06/2022   GLUCOSE 128 (H) 01/06/2022   GFRNONAA 15 (L) 01/06/2022   GFRAA 28 (L) 05/05/2020   CALCIUM 8.8 (L) 01/06/2022   PHOS 5.2 (H) 01/04/2022   PROT 5.8 (L) 01/03/2022   ALBUMIN 3.4 (L) 01/03/2022   LABGLOB 2.4 05/03/2019   BILITOT 1.1 01/03/2022   ALKPHOS 50 01/03/2022   AST 22 01/03/2022   ALT 21 01/03/2022   ANIONGAP 7 01/06/2022    GFR: Estimated Creatinine Clearance: 13.9 mL/min (A) (by C-G formula based on SCr of 2.87 mg/dL (H)).  Recent Results (from the past 240 hour(s))  Blood Culture (routine x 2)     Status: Abnormal   Collection Time: 01/02/22 11:15 PM   Specimen: BLOOD  Result Value Ref Range Status   Specimen Description   Final    BLOOD RIGHT ANTECUBITAL Performed at [facility] [address], [place], [place] [address]    Special Requests   Final    BOTTLES DRAWN AEROBIC AND ANAEROBIC Blood Culture adequate  volume Performed at [facility] [address] [place], [place] [address]    Culture  Setup Time   Final    GRAM POSITIVE COCCI IN CHAINS IN BOTH AEROBIC AND ANAEROBIC BOTTLES CRITICAL RESULT CALLED TO, READ BACK BY AND VERIFIED WITH: PHARMD A PHAM [phone] AT 1340 BY CM Performed at [facility], [place] [address] [place], [place] [address]    Culture GROUP B STREP(S.AGALACTIAE)ISOLATED (A)  Final   Report Status 01/05/2022 FINAL  Final   Organism ID, Bacteria GROUP B STREP(S.AGALACTIAE)ISOLATED  Final      Susceptibility   Group b strep(s.agalactiae)isolated - MIC*    CLINDAMYCIN <=0.25 SENSITIVE Sensitive     AMPICILLIN <=0.25 SENSITIVE Sensitive     ERYTHROMYCIN <=0.12 SENSITIVE Sensitive     VANCOMYCIN 0.5 SENSITIVE Sensitive     CEFTRIAXONE <=0.12 SENSITIVE Sensitive     LEVOFLOXACIN 0.5 SENSITIVE Sensitive     PENICILLIN <=0.06 SENSITIVE Sensitive     * GROUP B STREP(S.AGALACTIAE)ISOLATED  Blood Culture ID Panel (Reflexed)     Status: Abnormal   Collection Time: 01/02/22 11:15 PM  Result Value Ref Range Status   Enterococcus faecalis NOT DETECTED NOT DETECTED Final   Enterococcus Faecium NOT DETECTED NOT DETECTED Final   Listeria monocytogenes NOT DETECTED NOT DETECTED Final   Staphylococcus species NOT DETECTED NOT DETECTED Final   Staphylococcus aureus (BCID) NOT DETECTED NOT DETECTED Final   Staphylococcus epidermidis NOT DETECTED NOT DETECTED Final   Staphylococcus lugdunensis NOT DETECTED NOT DETECTED Final   Streptococcus species DETECTED (A) NOT  DETECTED Final    Comment: CRITICAL RESULT CALLED TO, READ BACK BY AND VERIFIED WITH: PHARMD A PHAM [id number] AT 1340 BY CM    Streptococcus agalactiae DETECTED (A) NOT DETECTED Final    Comment: CRITICAL RESULT CALLED TO, READ BACK BY AND VERIFIED WITH: PHARMD A PHAM [id number] AT 1340 BY CM    Streptococcus pneumoniae NOT DETECTED NOT DETECTED Final   Streptococcus pyogenes NOT DETECTED NOT DETECTED  Final   A.calcoaceticus-baumannii NOT DETECTED NOT DETECTED Final   Bacteroides fragilis NOT DETECTED NOT DETECTED Final   Enterobacterales NOT DETECTED NOT DETECTED Final   Enterobacter cloacae complex NOT DETECTED NOT DETECTED Final   Escherichia coli NOT DETECTED NOT DETECTED Final   Klebsiella aerogenes NOT DETECTED NOT DETECTED Final   Klebsiella oxytoca NOT DETECTED NOT DETECTED Final   Klebsiella pneumoniae NOT DETECTED NOT DETECTED Final   Proteus species NOT DETECTED NOT DETECTED Final   Salmonella species NOT DETECTED NOT DETECTED Final   Serratia marcescens NOT DETECTED NOT DETECTED Final   Haemophilus influenzae NOT DETECTED NOT DETECTED Final   Neisseria meningitidis NOT DETECTED NOT DETECTED Final   Pseudomonas aeruginosa NOT DETECTED NOT DETECTED Final   Stenotrophomonas maltophilia NOT DETECTED NOT DETECTED Final   Candida albicans NOT DETECTED NOT DETECTED Final   Candida auris NOT DETECTED NOT DETECTED Final   Candida glabrata NOT DETECTED NOT DETECTED Final   Candida krusei NOT DETECTED NOT DETECTED Final   Candida parapsilosis NOT DETECTED NOT DETECTED Final   Candida tropicalis NOT DETECTED NOT DETECTED Final   Cryptococcus neoformans/gattii NOT DETECTED NOT DETECTED Final    Comment: Performed at [facility], [address], [place], [place] [address]  Blood Culture (routine x 2)     Status: Abnormal   Collection Time: 01/03/22 12:26 AM   Specimen: BLOOD  Result Value Ref Range Status   Specimen Description   Final    BLOOD BLOOD RIGHT HAND Performed at [facility] [address] [place] [address], [place], [place] [address]    Special Requests   Final    BOTTLES DRAWN AEROBIC AND ANAEROBIC Blood Culture adequate volume Performed at [facility] [address] [place], [place] [address]    Culture  Setup Time   Final    GRAM POSITIVE COCCI IN CHAINS IN BOTH AEROBIC AND ANAEROBIC BOTTLES CRITICAL RESULT CALLED TO, READ BACK BY  AND VERIFIED WITH: PHARMD CRYSTAL ROBERTSON ON 01/03/22 @ 1557 BY DRT    Culture (A)  Final    GROUP B STREP(S.AGALACTIAE)ISOLATED SUSCEPTIBILITIES PERFORMED ON PREVIOUS CULTURE WITHIN THE LAST 5 DAYS. Performed at [facility], [place] [address], [place], [place] [address]    Report Status 01/05/2022 FINAL  Final  Resp Panel by RT-PCR (Flu A&B, Covid) Anterior Nasal Swab     Status: None   Collection Time: 01/03/22 12:31 AM   Specimen: Anterior Nasal Swab  Result Value Ref Range Status   SARS Coronavirus 2 by RT PCR NEGATIVE NEGATIVE Final    Comment: (NOTE) SARS-CoV-2 target nucleic acids are NOT DETECTED.  The SARS-CoV-2 RNA is generally detectable in upper respiratory specimens during the acute phase of infection. The lowest concentration of SARS-CoV-2 viral copies this assay can detect is 138 copies/mL. A negative result does not preclude SARS-Cov-2 infection and should not be used as the sole basis for treatment or other patient management decisions. A negative result may occur with  improper specimen collection/handling, submission of specimen other than nasopharyngeal swab, presence  of viral mutation(s) within the areas targeted by this assay, and inadequate number of viral copies(<138 copies/mL). A negative result must be combined with clinical observations, patient history, and epidemiological information. The expected result is Negative.  Fact Sheet for Patients:  EntrepreneurPulse.com.au  Fact Sheet for Healthcare Providers:  IncredibleEmployment.be  This test is no t yet approved or cleared by the [place] FDA and  has been authorized for detection and/or diagnosis of SARS-CoV-2 by FDA under an Emergency Use Authorization (EUA). This EUA will remain  in effect (meaning this test can be used) for the duration of the COVID-19 declaration under Section 564(b)(1) of the Act, 21 U.S.C.section 360bbb-3(b)(1), unless the  authorization is terminated  or revoked sooner.       Influenza A by PCR NEGATIVE NEGATIVE Final   Influenza B by PCR NEGATIVE NEGATIVE Final    Comment: (NOTE) The Xpert Xpress SARS-CoV-2/FLU/RSV plus assay is intended as an aid in the diagnosis of influenza from Nasopharyngeal swab specimens and should not be used as a sole basis for treatment. Nasal washings and aspirates are unacceptable for Xpert Xpress SARS-CoV-2/FLU/RSV testing.  Fact Sheet for Patients: EntrepreneurPulse.com.au  Fact Sheet for Healthcare Providers: IncredibleEmployment.be  This test is not yet approved or cleared by the [place] FDA and has been authorized for detection and/or diagnosis of SARS-CoV-2 by FDA under an Emergency Use Authorization (EUA). This EUA will remain in effect (meaning this test can be used) for the duration of the COVID-19 declaration under Section 564(b)(1) of the Act, 21 U.S.C. section 360bbb-3(b)(1), unless the authorization is terminated or revoked.  Performed at [facility], [place] [address] [place], [place] [address]   MRSA Next Gen by PCR, Nasal     Status: Abnormal   Collection Time: 01/03/22  2:50 AM   Specimen: Nasal Mucosa; Nasal Swab  Result Value Ref Range Status   MRSA by PCR Next Gen DETECTED (A) NOT DETECTED Final    Comment: (NOTE) The GeneXpert MRSA Assay (FDA approved for NASAL specimens only), is one component of a comprehensive MRSA colonization surveillance program. It is not intended to diagnose MRSA infection nor to guide or monitor treatment for MRSA infections. Test performance is not FDA approved in patients less than 38 years old. Performed at [facility], [place] [address], [place], [place] [address]   Respiratory (~20 pathogens) panel by PCR     Status: None   Collection Time: 01/03/22  9:00 AM   Specimen: Nasopharyngeal Swab; Respiratory  Result Value Ref Range Status    Adenovirus NOT DETECTED NOT DETECTED Final   Coronavirus 229E NOT DETECTED NOT DETECTED Final    Comment: (NOTE) The Coronavirus on the Respiratory Panel, DOES NOT test for the novel  Coronavirus (2019 nCoV)    Coronavirus HKU1 NOT DETECTED NOT DETECTED Final   Coronavirus NL63 NOT DETECTED NOT DETECTED Final   Coronavirus OC43 NOT DETECTED NOT DETECTED Final   Metapneumovirus NOT DETECTED NOT DETECTED Final   Rhinovirus / Enterovirus NOT DETECTED NOT DETECTED Final   Influenza A NOT DETECTED NOT DETECTED Final   Influenza B NOT DETECTED NOT DETECTED Final   Parainfluenza Virus 1 NOT DETECTED NOT DETECTED Final   Parainfluenza Virus 2 NOT DETECTED NOT DETECTED Final   Parainfluenza Virus 3 NOT DETECTED NOT DETECTED Final   Parainfluenza Virus 4 NOT DETECTED NOT DETECTED Final   Respiratory Syncytial Virus NOT DETECTED NOT DETECTED Final   Bordetella pertussis NOT DETECTED NOT DETECTED Final  Bordetella Parapertussis NOT DETECTED NOT DETECTED Final   Chlamydophila pneumoniae NOT DETECTED NOT DETECTED Final   Mycoplasma pneumoniae NOT DETECTED NOT DETECTED Final    Comment: Performed at [facility], [place] [address] [place], [place] [address]  Urine Culture     Status: None   Collection Time: 01/03/22  9:48 AM   Specimen: Urine, Clean Catch  Result Value Ref Range Status   Specimen Description   Final    URINE, CLEAN CATCH Performed at [facility], [place] [address] [place], [place] [address]    Special Requests   Final    NONE Performed at [facility], [place] [address], [place], [place] [address]    Culture   Final    NO GROWTH Performed at [facility], [place] [address], [place], [place] [address]    Report Status 01/04/2022 FINAL  Final  Culture, blood (Routine X 2) w Reflex to ID Panel     Status: None (Preliminary result)   Collection Time: 01/04/22  7:36 AM   Specimen: BLOOD  Result Value Ref Range Status   Specimen  Description   Final    BLOOD BLOOD LEFT ARM Performed at [facility] [address] [place] [address] [place], [place] [address]    Special Requests   Final    BOTTLES DRAWN AEROBIC ONLY Blood Culture adequate volume Performed at [facility] [address] [place], [place] [address]    Culture   Final    NO GROWTH 2 DAYS Performed at [facility] [address], [place], [place] [address]    Report Status PENDING  Incomplete  Culture, blood (Routine X 2) w Reflex to ID Panel     Status: None (Preliminary result)   Collection Time: 01/04/22  7:46 AM   Specimen: BLOOD  Result Value Ref Range Status   Specimen Description   Final    BLOOD BLOOD RIGHT HAND Performed at [facility] [address], [place], [place] [address]    Special Requests   Final    BOTTLES DRAWN AEROBIC ONLY Blood Culture adequate volume Performed at [facility] [address] [place], [place] [address]    Culture   Final    NO GROWTH 2 DAYS Performed at [facility] [address], [place],  [address]    Report Status PENDING  Incomplete      Radiology Studies: CT ABDOMEN PELVIS WO CONTRAST  Result Date: 01/04/2022 CLINICAL DATA:  Left upper quadrant pain EXAM: CT ABDOMEN AND PELVIS WITHOUT CONTRAST TECHNIQUE: Multidetector CT imaging of the abdomen and pelvis was performed following the standard protocol without IV contrast. RADIATION DOSE REDUCTION: This exam was performed according to the departmental dose-optimization program which includes automated exposure control, adjustment of the mA and/or kV according to patient size and/or use of iterative reconstruction technique. COMPARISON:  None Available. FINDINGS: Lower chest: Small to moderate bilateral pleural effusions. Compressive atelectasis in the lower lobes. Small hiatal hernia. Hepatobiliary: Gallbladder is contracted. There appears to be gallbladder wall  thickening. No visible ductal dilatation or focal hepatic abnormality. Pancreas: No focal abnormality or ductal dilatation. Spleen: Splenomegaly with a craniocaudal length of 17 cm. Adrenals/Urinary Tract: Bilateral cortical thinning. No stones or hydronephrosis. Adrenal glands unremarkable. Urinary bladder decompressed with Foley catheter in place. Stomach/Bowel: Moderate stool burden. Stomach, large and small bowel grossly unremarkable. Vascular/Lymphatic: Aortic atherosclerosis. No evidence of aneurysm or adenopathy. Reproductive:  Uterus and adnexa unremarkable.  No mass. Other: No free fluid or free air. Musculoskeletal: No acute bony abnormality. IMPRESSION: Small to moderate bilateral pleural effusions. Compressive atelectasis in the lower lobes. Small hiatal hernia. Splenomegaly. Gallbladder is contracted. There appears to be gallbladder wall thickening. This could be further evaluated with right upper quadrant ultrasound if felt clinically indicated. Aortic atherosclerosis. Electronically Signed   By: Rolm Baptise M.D.   On: 01/04/2022 21:15      LOS: 3 days    Cordelia Poche, MD [facility] 01/06/2022, 10:40 AM   If 7PM-7AM, please contact night-coverage [email or web address]

## 2022-01-06 NOTE — Progress Notes (Signed)
SATURATION QUALIFICATIONS: (This note is used to comply with regulatory documentation for home oxygen)  Patient Saturations on Room Air at Rest = 91%  Patient Saturations on Room Air while Ambulating = 80%  Patient Saturations on 3 Liters of oxygen while Ambulating = 91%  Please briefly explain why patient needs home oxygen: Patient was able to ambulate 2 feet, Oxygen desaturated on room air 80 %. Pt denied SOB. 3lt applied O2 increased 95%.

## 2022-01-06 NOTE — Progress Notes (Signed)
Pt refused cpap tonight. 

## 2022-01-07 DIAGNOSIS — E039 Hypothyroidism, unspecified: Secondary | ICD-10-CM | POA: Diagnosis not present

## 2022-01-07 DIAGNOSIS — J9601 Acute respiratory failure with hypoxia: Secondary | ICD-10-CM | POA: Diagnosis not present

## 2022-01-07 DIAGNOSIS — I350 Nonrheumatic aortic (valve) stenosis: Secondary | ICD-10-CM | POA: Diagnosis not present

## 2022-01-07 DIAGNOSIS — A419 Sepsis, unspecified organism: Secondary | ICD-10-CM | POA: Diagnosis not present

## 2022-01-07 LAB — COMPREHENSIVE METABOLIC PANEL
ALT: <5 U/L (ref 0–44)
AST: 12 U/L — ABNORMAL LOW (ref 15–41)
Albumin: 3.3 g/dL — ABNORMAL LOW (ref 3.5–5.0)
Alkaline Phosphatase: 53 U/L (ref 38–126)
Anion gap: 9 (ref 5–15)
BUN: 71 mg/dL — ABNORMAL HIGH (ref 8–23)
CO2: 23 mmol/L (ref 22–32)
Calcium: 8.9 mg/dL (ref 8.9–10.3)
Chloride: 106 mmol/L (ref 98–111)
Creatinine, Ser: 2.73 mg/dL — ABNORMAL HIGH (ref 0.44–1.00)
GFR, Estimated: 16 mL/min — ABNORMAL LOW (ref >60)
Glucose, Bld: 248 mg/dL — ABNORMAL HIGH (ref 70–99)
Potassium: 4.4 mmol/L (ref 3.5–5.1)
Sodium: 138 mmol/L (ref 135–145)
Total Bilirubin: 0.3 mg/dL (ref 0.3–1.2)
Total Protein: 6.4 g/dL — ABNORMAL LOW (ref 6.5–8.1)

## 2022-01-07 LAB — CBC
HCT: 28.7 % — ABNORMAL LOW (ref 36.0–46.0)
Hemoglobin: 8.2 g/dL — ABNORMAL LOW (ref 12.0–15.0)
MCH: 25.2 pg — ABNORMAL LOW (ref 26.0–34.0)
MCHC: 28.6 g/dL — ABNORMAL LOW (ref 30.0–36.0)
MCV: 88.3 fL (ref 80.0–100.0)
Platelets: 317 10*3/uL (ref 150–400)
RBC: 3.25 MIL/uL — ABNORMAL LOW (ref 3.87–5.11)
RDW: 21 % — ABNORMAL HIGH (ref 11.5–15.5)
WBC: 6.2 10*3/uL (ref 4.0–10.5)
nRBC: 0.3 % — ABNORMAL HIGH (ref 0.0–0.2)

## 2022-01-07 LAB — GLUCOSE, CAPILLARY
Glucose-Capillary: 146 mg/dL — ABNORMAL HIGH (ref 70–99)
Glucose-Capillary: 270 mg/dL — ABNORMAL HIGH (ref 70–99)
Glucose-Capillary: 127 mg/dL — ABNORMAL HIGH (ref 70–99)
Glucose-Capillary: 180 mg/dL — ABNORMAL HIGH (ref 70–99)

## 2022-01-07 NOTE — Progress Notes (Signed)
PT refused CPAP.  

## 2022-01-07 NOTE — Progress Notes (Signed)
Physical Therapy Treatment Patient Details Name: [name] [name] MRN: [id number] DOB: 11-19-1932 Today's Date: 01/07/2022   History of Present Illness [name] [name] is a 88 y.o. female with medical history significant for type 2 diabetes mellitus, anemia of chronic disease , acquired hypothyroidism, stage IV CKD, seizures, moderate aortic stenosis, chronic diastolic heart failure, who is admitted to [facility] on 01/02/2022 with suspected severe sepsis in the setting of HCAP pneumonia after presenting from home to [facility] ED complaining of shortness of breath.    PT Comments    The patient  reports feeling weak today. Patient ambulated on RA x 80', SPO2 dropped to 80%, 96 % on  3 L. Will check saturation  walk test  next visit. Plans for TEE tomorrow.  Recommendations for follow up therapy are one component of a multi-disciplinary discharge planning process, led by the attending physician.  Recommendations may be updated based on patient status, additional functional criteria and insurance authorization.  Follow Up Recommendations  Skilled nursing-short term rehab (<3 hours/day) Can patient physically be transported by private vehicle: Yes   Assistance Recommended at Discharge Intermittent Supervision/Assistance  Patient can return home with the following A little help with walking and/or transfers;A little help with bathing/dressing/bathroom;Assistance with cooking/housework;Assist for transportation;Help with stairs or ramp for entrance   Equipment Recommendations  None recommended by PT    Recommendations for Other Services       Precautions / Restrictions Precautions Precautions: Fall Precaution Comments: fell PTA, monitor O2     Mobility  Bed Mobility   Bed Mobility: Supine to Sit     Supine to sit: Min guard     General bed mobility comments: extra time, HOB raised    Transfers Overall transfer level: Needs assistance Equipment used: Rolling walker (2  wheels) Transfers: Sit to/from Stand Sit to Stand: Min guard           General transfer comment: VCs hand placement    Ambulation/Gait Ambulation/Gait assistance: Min guard Gait Distance (Feet): 80 Feet Assistive device: Rolling walker (2 wheels) Gait Pattern/deviations: Step-through pattern, Decreased stride length Gait velocity: decr     General Gait Details: steady with RW, SaO2 80% on room air at rest, reported fatigue   Stairs             Wheelchair Mobility    Modified Rankin (Stroke Patients Only)       Balance Overall balance assessment: History of Falls, Needs assistance Sitting-balance support: Feet supported, No upper extremity supported Sitting balance-Leahy Scale: Good     Standing balance support: Bilateral upper extremity supported, During functional activity, Reliant on assistive device for balance Standing balance-Leahy Scale: Poor                              Cognition Arousal/Alertness: Awake/alert Behavior During Therapy: WFL for tasks assessed/performed Overall Cognitive Status: Within Functional Limits for tasks assessed                                          Exercises      General Comments        Pertinent Vitals/Pain Pain Assessment Faces Pain Scale: Hurts little more Pain Location: left thigh Pain Descriptors / Indicators: Sore Pain Intervention(s): Monitored during session    Home Living  Prior Function            PT Goals (current goals can now be found in the care plan section) Progress towards PT goals: Progressing toward goals    Frequency    Min 3X/week      PT Plan Current plan remains appropriate    Co-evaluation              AM-PAC PT "6 Clicks" Mobility   Outcome Measure  Help needed turning from your back to your side while in a flat bed without using bedrails?: A Little Help needed moving from lying on your back to  sitting on the side of a flat bed without using bedrails?: A Little Help needed moving to and from a bed to a chair (including a wheelchair)?: A Little Help needed standing up from a chair using your arms (e.g., wheelchair or bedside chair)?: A Little Help needed to walk in hospital room?: A Little Help needed climbing 3-5 steps with a railing? : A Lot 6 Click Score: 17    End of Session Equipment Utilized During Treatment: Oxygen;Gait belt Activity Tolerance: Patient tolerated treatment well;Patient limited by fatigue Patient left: in chair;with chair alarm set;with call bell/phone within reach Nurse Communication: Mobility status PT Visit Diagnosis: Difficulty in walking, not elsewhere classified (R26.2);History of falling (Z91.81)     Time: 3343-5686 PT Time Calculation (min) (ACUTE ONLY): 28 min  Charges:  $Gait Training: 23-37 mins                     [place] Office [phone] Weekend [phone]    Claretha Cooper 01/07/2022, 1:42 PM

## 2022-01-07 NOTE — Progress Notes (Signed)
PROGRESS NOTE    [name] [name]  XVQ:[id number] DOB: 01-May-1932 DOA: 01/02/2022 PCP: Unk Pinto, MD   Brief Narrative: [name] [name] is a 88 y.o. female with history of diabetes mellitus type 2, anemia of chronic disease, acquired hypothyroidism, stage IV CKD, seizures, moderate aortic stenosis, chronic diastolic heart failure.  Patient presented secondary to severe sepsis with concern for HCAP.  Empiric antibiotics initiated.  During admission, patient developed severe hypotension requiring admission to the ICU and vasopressor support.  Work-up was significant for group B strep (strep agalactiae) bacteremia with unclear source.  Infectious ease was consulted with concern for possible endocarditis as source for recurrent GBS bacteremia.  Transthoracic Echocardiogram was negative for etiology.  Plan for transesophageal echocardiogram to complete work-up and direct antibiotic therapy.  Hypotension improved with vasopressor support in addition to midodrine.  Vasopressors discontinued and patient discharged from ICU on 11/18.   Assessment and Plan:  Septic shock Present on admission. Secondary to GBS bacteremia. Associated lactic acidosis of 3.3 and hypotension requiring ICU admission and vasopressor support. Empiric antibiotics started. Midodrine started for management of hypotension. Norepinephrine discontinued on 11/17 and patient transferred out of the ICU on 11/18.  GBS bacteremia Recurrent. Unclear source but possibly secondary to cellulitis vs endocarditis. ID consulted. Transthoracic Echocardiogram ordered on 11/17 and was not positive for vegetation.  -ID recommendations: Transesophageal Echocardiogram (planned for 11/21), Cefazolin IV  AKI on CKD stage IV AKI likely secondary to shock. Baseline creatinine of about 2. Creatinine of 2.72 on admission, up to a peak of 0.93. complicated by acute urinary retention, although no hydronephrosis noted on CT imaging. Creatinine  improved to 2.87 today. -BMP in AM  Acute urinary retention Unclear etiology. Foley catheter placed on 11/16.  Acute on chronic diastolic heart failure Recent Transthoracic Echocardiogram significant for an LVEF of 60-65% and grade II diastolic dysfunction.  Somnolence Patient unsure of etiology. Symptoms since yesterday. No new medications that are likely contributing. -Check CMP/CBC  Hypothyroidism -Continue synthroid  Normocytic anemia Hemoglobin currently stable. Patient follows with [facility].  Severe pulmonary hypertension Noted on Transthoracic Echocardiogram with associated moderate RV dilation and reduced function.  RV overload Suggestive from Transthoracic Echocardiogram. Patient started on Lasix trial. Possibly contributing to overall hypotension. Recent Transthoracic Echocardiogram from 09/2021 with normal RV EF. D-dimer elevated but low risk when adjusted for age. Patient will need cardiology follow-up.  Gallbladder wall thickening Noted on CT imaging. No correlative symptoms or labs.  Aortic stenosis Moderate-severe, noted on Transthoracic Echocardiogram.  Dizziness Per patient report when standing. Likely related to hypotension. -Continue PT  History of JAK2 positive myeloproliferative disorder Platelets stable. -Continue aspirin  DVT prophylaxis: SCDs Code Status:   Code Status: DNR Family Communication: None at bedside Disposition Plan: Discharge pending continued workup/management of bacteremia. Likely discharge as early as 11/22   Consultants:  PCCM Infectious disease  Procedures:  Transthoracic Echocardiogram  Antimicrobials: Vancomyicin Ceftriaxone Cefepime Azithromycin Cefazolin    Subjective: Patient reports feeling very sleepy. Feels uneasy about upcoming Transesophageal Echocardiogram. No other concerns.  Objective: BP (!) 115/43 (BP Location: Left Arm)   Pulse 70   Temp 97.9 F (36.6 C) (Oral)   Resp 20   Ht '5\' 2"'$  (1.575 m)    Wt 84.7 kg   SpO2 100%   BMI 34.15 kg/m   Examination:  General exam: Appears calm and comfortable Respiratory system: Clear to auscultation. Respiratory effort normal. Cardiovascular system: S1 & S2 heard. 3/6 systolic murmur Gastrointestinal system: Abdomen is nondistended, soft  and nontender. Normal bowel sounds heard. Central nervous system: Alert and oriented. No focal neurological deficits. Musculoskeletal: No calf tenderness Skin: No cyanosis. No rashes Psychiatry: Judgement and insight appear normal. Mood & affect appropriate.    Data Reviewed: I have personally reviewed following labs and imaging studies  CBC Lab Results  Component Value Date   WBC 7.4 01/05/2022   RBC 3.08 (L) 01/05/2022   HGB 8.0 (L) 01/05/2022   HCT 26.5 (L) 01/05/2022   MCV 86.0 01/05/2022   MCH 26.0 01/05/2022   PLT 266 01/05/2022   MCHC 30.2 01/05/2022   RDW 21.9 (H) 01/05/2022   LYMPHSABS 0.3 (L) 01/03/2022   MONOABS 0.8 01/03/2022   EOSABS 0.1 01/03/2022   BASOSABS 0.0 97/35/3299     Last metabolic panel Lab Results  Component Value Date   NA 137 01/06/2022   K 5.0 01/06/2022   CL 109 01/06/2022   CO2 21 (L) 01/06/2022   BUN 82 (H) 01/06/2022   CREATININE 2.87 (H) 01/06/2022   GLUCOSE 128 (H) 01/06/2022   GFRNONAA 15 (L) 01/06/2022   GFRAA 28 (L) 05/05/2020   CALCIUM 8.8 (L) 01/06/2022   PHOS 5.2 (H) 01/04/2022   PROT 5.8 (L) 01/03/2022   ALBUMIN 3.4 (L) 01/03/2022   LABGLOB 2.4 05/03/2019   BILITOT 1.1 01/03/2022   ALKPHOS 50 01/03/2022   AST 22 01/03/2022   ALT 21 01/03/2022   ANIONGAP 7 01/06/2022    GFR: Estimated Creatinine Clearance: 13.9 mL/min (A) (by C-G formula based on SCr of 2.87 mg/dL (H)).  Recent Results (from the past 240 hour(s))  Blood Culture (routine x 2)     Status: Abnormal   Collection Time: 01/02/22 11:15 PM   Specimen: BLOOD  Result Value Ref Range Status   Specimen Description   Final    BLOOD RIGHT ANTECUBITAL Performed at [facility] [address] [place], [place] [address]    Special Requests   Final    BOTTLES DRAWN AEROBIC AND ANAEROBIC Blood Culture adequate volume Performed at [facility] [address] [place], [place] [address]    Culture  Setup Time   Final    GRAM POSITIVE COCCI IN CHAINS IN BOTH AEROBIC AND ANAEROBIC BOTTLES CRITICAL RESULT CALLED TO, READ BACK BY AND VERIFIED WITH: PHARMD A PHAM [phone] AT 1340 BY CM Performed at [facility], [place] [address] [place], [place] [address]    Culture GROUP B STREP(S.AGALACTIAE)ISOLATED (A)  Final   Report Status 01/05/2022 FINAL  Final   Organism ID, Bacteria GROUP B STREP(S.AGALACTIAE)ISOLATED  Final      Susceptibility   Group b strep(s.agalactiae)isolated - MIC*    CLINDAMYCIN <=0.25 SENSITIVE Sensitive     AMPICILLIN <=0.25 SENSITIVE Sensitive     ERYTHROMYCIN <=0.12 SENSITIVE Sensitive     VANCOMYCIN 0.5 SENSITIVE Sensitive     CEFTRIAXONE <=0.12 SENSITIVE Sensitive     LEVOFLOXACIN 0.5 SENSITIVE Sensitive     PENICILLIN <=0.06 SENSITIVE Sensitive     * GROUP B STREP(S.AGALACTIAE)ISOLATED  Blood Culture ID Panel (Reflexed)     Status: Abnormal   Collection Time: 01/02/22 11:15 PM  Result Value Ref Range Status   Enterococcus faecalis NOT DETECTED NOT DETECTED Final   Enterococcus Faecium NOT DETECTED NOT DETECTED Final   Listeria monocytogenes NOT DETECTED NOT DETECTED Final   Staphylococcus species NOT DETECTED NOT DETECTED Final   Staphylococcus aureus (BCID) NOT DETECTED NOT DETECTED Final   Staphylococcus epidermidis NOT DETECTED NOT DETECTED  Final   Staphylococcus lugdunensis NOT DETECTED NOT DETECTED Final   Streptococcus species DETECTED (A) NOT DETECTED Final    Comment: CRITICAL RESULT CALLED TO, READ BACK BY AND VERIFIED WITH: PHARMD A PHAM [phone] AT 1340 BY CM    Streptococcus agalactiae DETECTED (A) NOT DETECTED Final    Comment: CRITICAL RESULT CALLED TO, READ BACK BY AND VERIFIED  WITH: PHARMD A PHAM [phone] AT 1340 BY CM    Streptococcus pneumoniae NOT DETECTED NOT DETECTED Final   Streptococcus pyogenes NOT DETECTED NOT DETECTED Final   A.calcoaceticus-baumannii NOT DETECTED NOT DETECTED Final   Bacteroides fragilis NOT DETECTED NOT DETECTED Final   Enterobacterales NOT DETECTED NOT DETECTED Final   Enterobacter cloacae complex NOT DETECTED NOT DETECTED Final   Escherichia coli NOT DETECTED NOT DETECTED Final   Klebsiella aerogenes NOT DETECTED NOT DETECTED Final   Klebsiella oxytoca NOT DETECTED NOT DETECTED Final   Klebsiella pneumoniae NOT DETECTED NOT DETECTED Final   Proteus species NOT DETECTED NOT DETECTED Final   Salmonella species NOT DETECTED NOT DETECTED Final   Serratia marcescens NOT DETECTED NOT DETECTED Final   Haemophilus influenzae NOT DETECTED NOT DETECTED Final   Neisseria meningitidis NOT DETECTED NOT DETECTED Final   Pseudomonas aeruginosa NOT DETECTED NOT DETECTED Final   Stenotrophomonas maltophilia NOT DETECTED NOT DETECTED Final   Candida albicans NOT DETECTED NOT DETECTED Final   Candida auris NOT DETECTED NOT DETECTED Final   Candida glabrata NOT DETECTED NOT DETECTED Final   Candida krusei NOT DETECTED NOT DETECTED Final   Candida parapsilosis NOT DETECTED NOT DETECTED Final   Candida tropicalis NOT DETECTED NOT DETECTED Final   Cryptococcus neoformans/gattii NOT DETECTED NOT DETECTED Final    Comment: Performed at [facility], [address] [place], [place] [address]  Blood Culture (routine x 2)     Status: Abnormal   Collection Time: 01/03/22 12:26 AM   Specimen: BLOOD  Result Value Ref Range Status   Specimen Description   Final    BLOOD BLOOD RIGHT HAND Performed at [facility] [address] [place], [place] [address]    Special Requests   Final    BOTTLES DRAWN AEROBIC AND ANAEROBIC Blood Culture adequate volume Performed at [facility] [address], [place],  [place] [address]    Culture  Setup Time   Final    GRAM POSITIVE COCCI IN CHAINS IN BOTH AEROBIC AND ANAEROBIC BOTTLES CRITICAL RESULT CALLED TO, READ BACK BY AND VERIFIED WITH: PHARMD CRYSTAL ROBERTSON ON 01/03/22 @ 1557 BY DRT    Culture (A)  Final    GROUP B STREP(S.AGALACTIAE)ISOLATED SUSCEPTIBILITIES PERFORMED ON PREVIOUS CULTURE WITHIN THE LAST 5 DAYS. Performed at [facility], [place] [address], [place],  [address]    Report Status 01/05/2022 FINAL  Final  Resp Panel by RT-PCR (Flu A&B, Covid) Anterior Nasal Swab     Status: None   Collection Time: 01/03/22 12:31 AM   Specimen: Anterior Nasal Swab  Result Value Ref Range Status   SARS Coronavirus 2 by RT PCR NEGATIVE NEGATIVE Final    Comment: (NOTE) SARS-CoV-2 target nucleic acids are NOT DETECTED.  The SARS-CoV-2 RNA is generally detectable in upper respiratory specimens during the acute phase of infection. The lowest concentration of SARS-CoV-2 viral copies this assay can detect is 138 copies/mL. A negative result does not preclude SARS-Cov-2 infection and should not be used as the sole basis for treatment or other patient management decisions. A  negative result may occur with  improper specimen collection/handling, submission of specimen other than nasopharyngeal swab, presence of viral mutation(s) within the areas targeted by this assay, and inadequate number of viral copies(<138 copies/mL). A negative result must be combined with clinical observations, patient history, and epidemiological information. The expected result is Negative.  Fact Sheet for Patients:  EntrepreneurPulse.com.au  Fact Sheet for Healthcare Providers:  IncredibleEmployment.be  This test is no t yet approved or cleared by the [place] FDA and  has been authorized for detection and/or diagnosis of SARS-CoV-2 by FDA under an Emergency Use Authorization (EUA). This EUA will remain  in effect (meaning  this test can be used) for the duration of the COVID-19 declaration under Section 564(b)(1) of the Act, 21 U.S.C.section 360bbb-3(b)(1), unless the authorization is terminated  or revoked sooner.       Influenza A by PCR NEGATIVE NEGATIVE Final   Influenza B by PCR NEGATIVE NEGATIVE Final    Comment: (NOTE) The Xpert Xpress SARS-CoV-2/FLU/RSV plus assay is intended as an aid in the diagnosis of influenza from Nasopharyngeal swab specimens and should not be used as a sole basis for treatment. Nasal washings and aspirates are unacceptable for Xpert Xpress SARS-CoV-2/FLU/RSV testing.  Fact Sheet for Patients: EntrepreneurPulse.com.au  Fact Sheet for Healthcare Providers: IncredibleEmployment.be  This test is not yet approved or cleared by the [place] FDA and has been authorized for detection and/or diagnosis of SARS-CoV-2 by FDA under an Emergency Use Authorization (EUA). This EUA will remain in effect (meaning this test can be used) for the duration of the COVID-19 declaration under Section 564(b)(1) of the Act, 21 U.S.C. section 360bbb-3(b)(1), unless the authorization is terminated or revoked.  Performed at [facility], [place] [address] [place], [place] [address]   MRSA Next Gen by PCR, Nasal     Status: Abnormal   Collection Time: 01/03/22  2:50 AM   Specimen: Nasal Mucosa; Nasal Swab  Result Value Ref Range Status   MRSA by PCR Next Gen DETECTED (A) NOT DETECTED Final    Comment: (NOTE) The GeneXpert MRSA Assay (FDA approved for NASAL specimens only), is one component of a comprehensive MRSA colonization surveillance program. It is not intended to diagnose MRSA infection nor to guide or monitor treatment for MRSA infections. Test performance is not FDA approved in patients less than 9 years old. Performed at [facility], [place] [address], [place],  [address]   Respiratory (~20  pathogens) panel by PCR     Status: None   Collection Time: 01/03/22  9:00 AM   Specimen: Nasopharyngeal Swab; Respiratory  Result Value Ref Range Status   Adenovirus NOT DETECTED NOT DETECTED Final   Coronavirus 229E NOT DETECTED NOT DETECTED Final    Comment: (NOTE) The Coronavirus on the Respiratory Panel, DOES NOT test for the novel  Coronavirus (2019 nCoV)    Coronavirus HKU1 NOT DETECTED NOT DETECTED Final   Coronavirus NL63 NOT DETECTED NOT DETECTED Final   Coronavirus OC43 NOT DETECTED NOT DETECTED Final   Metapneumovirus NOT DETECTED NOT DETECTED Final   Rhinovirus / Enterovirus NOT DETECTED NOT DETECTED Final   Influenza A NOT DETECTED NOT DETECTED Final   Influenza B NOT DETECTED NOT DETECTED Final   Parainfluenza Virus 1 NOT DETECTED NOT DETECTED Final   Parainfluenza Virus 2 NOT DETECTED NOT DETECTED Final   Parainfluenza Virus 3 NOT DETECTED NOT DETECTED Final   Parainfluenza Virus 4 NOT DETECTED NOT DETECTED Final   Respiratory  Syncytial Virus NOT DETECTED NOT DETECTED Final   Bordetella pertussis NOT DETECTED NOT DETECTED Final   Bordetella Parapertussis NOT DETECTED NOT DETECTED Final   Chlamydophila pneumoniae NOT DETECTED NOT DETECTED Final   Mycoplasma pneumoniae NOT DETECTED NOT DETECTED Final    Comment: Performed at [facility], [place] [address], [place], [place] [address]  Urine Culture     Status: None   Collection Time: 01/03/22  9:48 AM   Specimen: Urine, Clean Catch  Result Value Ref Range Status   Specimen Description   Final    URINE, CLEAN CATCH Performed at [facility], [place] [address] [place], [place] [address]    Special Requests   Final    NONE Performed at [facility], [place] [address] [place], [place] [address]    Culture   Final    NO GROWTH Performed at [facility], [place] [address], [place], [place] [address]    Report Status 01/04/2022 FINAL  Final  Culture, blood (Routine X 2) w  Reflex to ID Panel     Status: None (Preliminary result)   Collection Time: 01/04/22  7:36 AM   Specimen: BLOOD  Result Value Ref Range Status   Specimen Description   Final    BLOOD BLOOD LEFT ARM Performed at [facility] [address], [place], [place]  [address]    Special Requests   Final    BOTTLES DRAWN AEROBIC ONLY Blood Culture adequate volume Performed at [facility] [address] [place], [place] [address]    Culture   Final    NO GROWTH 3 DAYS Performed at [facility], [place] [address] [place], [place] [address]    Report Status PENDING  Incomplete  Culture, blood (Routine X 2) w Reflex to ID Panel     Status: None (Preliminary result)   Collection Time: 01/04/22  7:46 AM   Specimen: BLOOD  Result Value Ref Range Status   Specimen Description   Final    BLOOD BLOOD RIGHT HAND Performed at [facility] [address], [place], [place] [address]    Special Requests   Final    BOTTLES DRAWN AEROBIC ONLY Blood Culture adequate volume Performed at [facility] [address], [place], [place] [address]    Culture   Final    NO GROWTH 3 DAYS Performed at [facility], [place] [address], [place], [place] [address]    Report Status PENDING  Incomplete      Radiology Studies: No results found.    LOS: 4 days    Cordelia Poche, MD [facility] 01/07/2022, 12:00 PM   If 7PM-7AM, please contact night-coverage [email or web address]

## 2022-01-07 NOTE — Care Management Important Message (Signed)
Important Message  Patient Details IM Letter given. Name: BRIGGITTE BOLINE MRN: [id number] Date of Birth: Dec 29, 1932   Medicare Important Message Given:  Yes     Kerin Salen 01/07/2022, 12:43 PM

## 2022-01-07 NOTE — NC FL2 (Signed)
[place] LEVEL OF CARE SCREENING TOOL     IDENTIFICATION  Patient Name: [name] [name] Birthdate: 10-Jul-1932 Sex: female Admission Date (Current Location): 01/02/2022  [facility] and [place] Number:  Herbalist and Address:  [facility],  [place], [place]      Provider Number: [id number]  Attending Physician Name and Address:  Mariel Aloe, MD  Relative Name and Phone Number:       Current Level of Care: Hospital Recommended Level of Care: [place] Prior Approval Number:    Date Approved/Denied:   PASRR Number: [id number]  Discharge Plan: SNF    Current Diagnoses: Patient Active Problem List   Diagnosis Date Noted   Severe sepsis ([place]) 01/03/2022   HCAP (healthcare-associated pneumonia) 01/03/2022   Generalized weakness 01/03/2022   Fall at home, initial encounter 01/03/2022   History of seizures 01/03/2022   Allergic rhinitis 01/03/2022   Aortic stenosis 01/03/2022   Chronic diastolic CHF (congestive heart failure) ([place]) 01/03/2022   Bacteremia due to group B Streptococcus 10/18/2021   Acute respiratory failure with hypoxia ([place]) 10/14/2021   Benign neoplasm of cecum    Benign neoplasm of rectum    Blood in stool    Melena    Acute blood loss anemia    GI bleed 04/04/2020   Anemia of chronic disease 11/02/2019   DM neuropathy, type II diabetes mellitus (HCC)    Stage 4 chronic kidney disease (HCC)    Obesity (BMI 30-39.9)    High cholesterol    Aortic valve thickening by Cardiac echo in 2015 11/01/2019   Iron deficiency anemia 05/10/2019   Secondary hyperparathyroidism of renal origin ([place]) 09/20/2018   DM2 (diabetes mellitus, type 2) ([place]) 12/08/2017   ACE inhibitor intolerance 12/08/2017   Vertigo of central origin 11/20/2015   Morbid obesity ([place]) BMI 35+ with T2DM, htn, hyperlipidemia, CKD, OSA 12/12/2014   OSA on CPAP 08/08/2014   Type II diabetes mellitus with neurological  manifestations ([place]) 09/15/2013   Hyperlipidemia associated with type 2 diabetes mellitus ([place]) 06/27/2013   Vitamin D deficiency 06/27/2013   Seizure disorder ([place]) 11/04/2012   Epilepsy ([place]) 11/04/2012   Anemia 06/13/2008   COLONIC POLYPS 06/09/2008   Acquired hypothyroidism 06/09/2008   CKD stage 4 due to type 2 diabetes mellitus ([place]) 06/09/2008   Venous (peripheral) insufficiency 06/09/2008   Gastroesophageal reflux disease 06/09/2008   Essential hypertension 06/09/2008    Orientation RESPIRATION BLADDER Height & Weight     Self, Time, Place, Situation  Normal Continent Weight: 84.7 kg Height:  '5\' 2"'$  (157.5 cm)  BEHAVIORAL SYMPTOMS/MOOD NEUROLOGICAL BOWEL NUTRITION STATUS      Continent Diet (regular)  AMBULATORY STATUS COMMUNICATION OF NEEDS Skin   Extensive Assist Verbally Normal                       Personal Care Assistance Level of Assistance  Bathing, Feeding, Dressing Bathing Assistance: Limited assistance Feeding assistance: Limited assistance Dressing Assistance: Limited assistance     Functional Limitations Info  Sight, Hearing, Speech Sight Info: Adequate Hearing Info: Adequate Speech Info: Adequate    SPECIAL CARE FACTORS FREQUENCY  PT (By licensed PT), OT (By licensed OT)     PT Frequency: 5 x weekly OT Frequency: 5 x weekly            Contractures Contractures Info: Not present    Additional Factors Info  Code Status Code Status Info: DNR  Current Medications (01/07/2022):  This is the current hospital active medication list Current Facility-Administered Medications  Medication Dose Route Frequency Provider Last Rate Last Admin   0.9 %  sodium chloride infusion  250 mL Intravenous Continuous Raenette Rover, NP   Paused at 01/03/22 1802   acetaminophen (TYLENOL) tablet 650 mg  650 mg Oral Q6H PRN Howerter, Justin B, DO   650 mg at 01/06/22 1212   Or   acetaminophen (TYLENOL) suppository 650 mg  650 mg Rectal Q6H PRN  Howerter, Justin B, DO       aspirin chewable tablet 81 mg  81 mg Oral Daily Howerter, Justin B, DO   81 mg at 01/07/22 0844   ceFAZolin (ANCEF) IVPB 2g/100 mL premix  2 g Intravenous Q12H Noemi Chapel P, DO 200 mL/hr at 01/07/22 0400 2 g at 01/07/22 0400   Chlorhexidine Gluconate Cloth 2 % PADS 6 each  6 each Topical Daily Edwin Dada, MD   6 each at 01/07/22 1238   hydrocortisone (ANUSOL-HC) 2.5 % rectal cream   Rectal BID Mariel Aloe, MD   Given at 01/06/22 2104   insulin aspart (novoLOG) injection 0-9 Units  0-9 Units Subcutaneous TID WC Howerter, Justin B, DO   5 Units at 01/07/22 1239   insulin detemir (LEVEMIR) injection 8 Units  8 Units Subcutaneous BID Howerter, Justin B, DO   8 Units at 01/07/22 1238   lactulose (CHRONULAC) 10 GM/15ML solution 20 g  20 g Oral Daily PRN Mariel Aloe, MD   20 g at 01/06/22 1720   levETIRAcetam (KEPPRA) tablet 250 mg  250 mg Oral Daily Angela Adam, RPH   250 mg at 01/07/22 0843   levETIRAcetam (KEPPRA) tablet 500 mg  500 mg Oral QHS Howerter, Justin B, DO   500 mg at 01/06/22 2103   levothyroxine (SYNTHROID) tablet 100 mcg  100 mcg Oral Q0600 Howerter, Justin B, DO   100 mcg at 01/07/22 0537   loratadine (CLARITIN) tablet 10 mg  10 mg Oral Daily Howerter, Justin B, DO   10 mg at 01/07/22 0843   midodrine (PROAMATINE) tablet 10 mg  10 mg Oral TID WC Noemi Chapel P, DO   10 mg at 01/07/22 1238   mupirocin ointment (BACTROBAN) 2 % 1 Application  1 Application Nasal BID Edwin Dada, MD   1 Application at 59/56/38 0844   ondansetron (ZOFRAN) injection 4 mg  4 mg Intravenous Q6H PRN Mariel Aloe, MD   4 mg at 01/05/22 7564   Oral care mouth rinse  15 mL Mouth Rinse 4 times per day Edwin Dada, MD   15 mL at 01/07/22 0847   Oral care mouth rinse  15 mL Mouth Rinse PRN Danford, Suann Larry, MD       rOPINIRole (REQUIP) tablet 0.5 mg  0.5 mg Oral QHS PRN Jennelle Human B, NP   0.5 mg at 01/06/22 2239     Discharge  Medications: Please see discharge summary for a list of discharge medications.  Relevant Imaging Results:  Relevant Lab Results:   Additional Information SSN [id number]  Leeroy Cha, RN

## 2022-01-07 NOTE — TOC Progression Note (Signed)
Transition of Care [facility]) - Progression Note    Patient Details  Name: [name] [name] MRN: [id number] Date of Birth: [birth year]  [facility] of [facility] CM/SW Contact  Leeroy Cha, RN Phone Number: 01/07/2022, 2:47 PM  Clinical Narrative:    Precert and information sent to [facility] for review at 1447.   Expected Discharge Plan: [place] Barriers to Discharge: Continued Medical Work up  Expected Discharge Plan and Services Expected Discharge Plan: [place] In-house Referral: NA Discharge Planning Services: CM Consult   Living arrangements for the past 2 months: [place]                 DME Arranged: N/A DME Agency: NA       HH Arranged: NA HH Agency: NA         Social Determinants of Health (SDOH) Interventions    Readmission Risk Interventions   No data to display

## 2022-01-08 ENCOUNTER — Inpatient Hospital Stay (HOSPITAL_COMMUNITY): Payer: Medicare Other

## 2022-01-08 ENCOUNTER — Encounter (HOSPITAL_COMMUNITY): Payer: Self-pay | Admitting: Anesthesiology

## 2022-01-08 ENCOUNTER — Encounter (HOSPITAL_COMMUNITY)
Admission: EM | Disposition: A | Discharge status: Skilled Nursing Facility | Payer: Self-pay | Source: Home / Self Care | Attending: Family Medicine

## 2022-01-08 ENCOUNTER — Inpatient Hospital Stay: Payer: Self-pay

## 2022-01-08 ENCOUNTER — Encounter (HOSPITAL_COMMUNITY): Payer: Self-pay | Admitting: Internal Medicine

## 2022-01-08 DIAGNOSIS — I129 Hypertensive chronic kidney disease with stage 1 through stage 4 chronic kidney disease, or unspecified chronic kidney disease: Secondary | ICD-10-CM

## 2022-01-08 DIAGNOSIS — R6520 Severe sepsis without septic shock: Secondary | ICD-10-CM | POA: Diagnosis not present

## 2022-01-08 DIAGNOSIS — Z87891 Personal history of nicotine dependence: Secondary | ICD-10-CM

## 2022-01-08 DIAGNOSIS — E039 Hypothyroidism, unspecified: Secondary | ICD-10-CM | POA: Diagnosis not present

## 2022-01-08 DIAGNOSIS — E1122 Type 2 diabetes mellitus with diabetic chronic kidney disease: Secondary | ICD-10-CM

## 2022-01-08 DIAGNOSIS — I350 Nonrheumatic aortic (valve) stenosis: Secondary | ICD-10-CM | POA: Diagnosis not present

## 2022-01-08 DIAGNOSIS — A419 Sepsis, unspecified organism: Secondary | ICD-10-CM | POA: Diagnosis not present

## 2022-01-08 DIAGNOSIS — J9601 Acute respiratory failure with hypoxia: Secondary | ICD-10-CM | POA: Diagnosis not present

## 2022-01-08 LAB — GLUCOSE, CAPILLARY
Glucose-Capillary: 144 mg/dL — ABNORMAL HIGH (ref 70–99)
Glucose-Capillary: 177 mg/dL — ABNORMAL HIGH (ref 70–99)
Glucose-Capillary: 165 mg/dL — ABNORMAL HIGH (ref 70–99)

## 2022-01-08 SURGERY — CANCELLED PROCEDURE

## 2022-01-08 MED ORDER — ROPINIROLE HCL 1 MG PO TABS
0.5000 mg | ORAL_TABLET | Freq: Three times a day (TID) | ORAL | Status: DC
  Administered 2022-01-08 – 2022-01-11 (×9): 0.5 mg via ORAL
  Filled 2022-01-08 (×12): qty 1

## 2022-01-08 MED ORDER — SODIUM CHLORIDE 0.9 % IV SOLN: INTRAVENOUS | Status: DC

## 2022-01-08 MED ORDER — OXYCODONE HCL 5 MG PO TABS
2.5000 mg | ORAL_TABLET | Freq: Once | ORAL | Status: AC
  Administered 2022-01-08: 2.5 mg via ORAL
  Filled 2022-01-08: qty 1

## 2022-01-08 MED ORDER — SODIUM CHLORIDE 0.9 % IV SOLN
2.0000 g | INTRAVENOUS | Status: DC
  Administered 2022-01-08 – 2022-01-10 (×3): 2 g via INTRAVENOUS
  Filled 2022-01-08 (×3): qty 20

## 2022-01-08 NOTE — Progress Notes (Signed)
PHARMACY CONSULT NOTE FOR:  OUTPATIENT  PARENTERAL ANTIBIOTIC THERAPY (OPAT)  Indication: GBS bacteremia - empiric IE treatment  Regimen: ceftriaxone 2g IV q24 hours End date: 02/15/2022  IV antibiotic discharge orders are pended. To discharging provider:  please sign these orders via discharge navigator,  Select New Orders & click on the button choice - Manage This Unsigned Work.     Thank you for allowing pharmacy to be a part of this patient's care.  [facility] 01/08/2022, 2:28 PM

## 2022-01-08 NOTE — Progress Notes (Signed)
PICC order received. Patient want to verify support from independent living at [facility] first before PICC placement.

## 2022-01-08 NOTE — Progress Notes (Addendum)
[facility] Infectious Diseases Follow Up Note  Patient Identification: Patient Name: [name] [name] MRN: [id number] [place] Date: 01/02/2022 10:37 PM Age: 88 y.o.Today's Date: 01/08/2022  Reason for Visit: Bacteremia  Principal Problem:   Severe sepsis [facility]) Active Problems:   Acquired hypothyroidism   Essential hypertension   DM2 (diabetes mellitus, type 2) (HCC)   Stage 4 chronic kidney disease ([place])   Anemia of chronic disease   Acute respiratory failure with hypoxia ([place])   HCAP (healthcare-associated pneumonia)   Generalized weakness   Fall at home, initial encounter   History of seizures   Allergic rhinitis   Aortic stenosis   Chronic diastolic CHF (congestive heart failure) (HCC)   Antibiotics:  Vancomycin 11/15 Cefepime 11/15, ceftriaxone 11/15, cefazolin 11/16-c Azithromycin 11/15  Lines/Hardwares: None , urinary catheter ( for urinary retention)  Interval Events: Remains afebrile, no leukocytosis, TEE today   Assessment 88 year old female with PMH as below including DM 2, acquired hypothyroidism, CKD, moderate aortic stenosis, seizures, chronic diastolic CHF, Myeloproliferative disorder who presented to the ED with sepsis with initial concerns for pneumonia.  Hospital course complicated by hypotension requiring admission to ICU and vasopressors as well as group B strep strep bacteremia with unclear source with some concerns about cellulitis initially. TTE 11/17  negative for vegetation.  Patient was downgraded from ICU on 11/18 after improvement of hypotension with midodrine support.   # Recurrent group B streptococcus bacteremia of unknown clear origin with ? Cellulitis in left thigh/Rt foot initially 11/17 blood culture no growth in 4 days TEE cancelled today due to risks far outweigh benefit and plan to treat for for endocarditis presumtively  Recommendations Needs tunneled CVC for long term IV abtx Plan  for ceftriaxone 2g iv daily for 6 weeks instead of Pen G  from 11/17 to assist with volume overload in the setting of CKD. Will not add gentamicin.  ID Pharm D to place OPAT orders  Fu in the ID clinic will be arranged  D/w primary as well as Cardiology ID will sign off.   Diagnosis: Presumptive endocarditis in the setting of recurrent bacteremia, poor TEE candidat  Culture Result: Group B strep   Allergies  Allergen Reactions   Ppd [Tuberculin Purified Protein Derivative] Other (See Comments)    indurated    OPAT Orders Discharge antibiotics to be given via PICC line Discharge antibiotics: ceftriaxone 2g iv daily  Per pharmacy protocol  Duration: 6 weeks  End Date: 02/15/22 [facility] Care Per Protocol:  Home health RN for IV administration and teaching; PICC line care and labs.    Labs weekly while on IV antibiotics: X__ CBC with differential __ BMP X__ CMP X__ CRP and ESR __ Vancomycin trough __ CK  X__ Please pull PIC at completion of IV antibiotics __ Please leave PIC in place until doctor has seen patient or been notified  Fax weekly labs to [phone]  Clinic Follow Up Appt: 3 weeks   Rest of the management as per the primary team. Thank you for the consult. Please page with pertinent questions or concerns.  ______________________________________________________________________ Subjective patient seen and examined at the bedside. She feels uncomfortable about having TEE today. Otherwise, no specific complaints    Past Medical History:  Diagnosis Date   Anemia, unspecified    Arthritis    "knees; right shoulder" (09/15/2013)   Basal cell carcinoma    "right upper outer lip"   DM neuropathy, type II diabetes mellitus ([place])    Dyspnea 2021  with low iron   GERD (gastroesophageal reflux disease)    Gout    High cholesterol    Hypertension    Hypothyroidism    Meniere's disease    Obesity (BMI 30-39.9)    Other forms of epilepsy and recurrent seizures  without mention of intractable epilepsy 11/04/2012   Non convulsive paroxysmal spells, responding to [facility], patient not driving.    Pneumonia ~ 1943   Skin cancer    "forehead; right hand"   Stage 4 chronic kidney disease ([place])    UTI (urinary tract infection)    Vitamin D deficiency    Past Surgical History:  Procedure Laterality Date   BIOPSY  04/05/2020   Procedure: BIOPSY;  Surgeon: Jerene Bears, MD;  Location: [facility];  Service: Gastroenterology;;   CERVICAL POLYPECTOMY     COLONOSCOPY WITH PROPOFOL N/A 04/06/2020   Procedure: COLONOSCOPY WITH PROPOFOL;  Surgeon: Jerene Bears, MD;  Location: [facility];  Service: Gastroenterology;  Laterality: N/A;   ESOPHAGOGASTRODUODENOSCOPY (EGD) WITH PROPOFOL N/A 04/05/2020   Procedure: ESOPHAGOGASTRODUODENOSCOPY (EGD) WITH PROPOFOL;  Surgeon: Jerene Bears, MD;  Location: [facility];  Service: Gastroenterology;  Laterality: N/A;   HAMMER TOE SURGERY Left 1980's   MOHS SURGERY  20087   "right upper outer lip"   POLYPECTOMY  04/06/2020   Procedure: POLYPECTOMY;  Surgeon: Jerene Bears, MD;  Location: [facility];  Service: Gastroenterology;;   TONSILLECTOMY  ~ 1947   WRIST SURGERY Left ~ 1950   "ran arm thru window"     Vitals BP (!) 134/44 (BP Location: Left Arm)   Pulse 76   Temp 97.9 F (36.6 C) (Oral)   Resp 16   Ht _0  (1.575 m)   Wt 83.3 kg   SpO2 92%   BMI 33.59 kg/m     Physical Exam Constitutional:  Elderly caucasian female sitting up in the bed and appears comfortable, somewhat anxious for TEE     Comments:   Cardiovascular:     Rate and Rhythm: Normal rate and regular rhythm.     Heart sounds: murmur +  Pulmonary:     Effort: Pulmonary effort is normal.     Comments: Normal breath sounds on [place]  Abdominal:     Palpations: Abdomen is soft.     Tenderness: non distended and non tender   Musculoskeletal:        General: No swelling or tenderness in peripheral joints, No spinal tenderness  Skin:     Comments: No lesions or rashes. No cellulitis in lower extremities noted   Neurological:     General: No focal deficit present. Awake, alert and oriented   Psychiatric:        Mood and Affect: Mood normal.   Pertinent Microbiology Results for orders placed or performed during the hospital encounter of 01/02/22  Blood Culture (routine x 2)     Status: Abnormal   Collection Time: 01/02/22 11:15 PM   Specimen: BLOOD  Result Value Ref Range Status   Specimen Description   Final    BLOOD RIGHT ANTECUBITAL Performed at [facility] [address], [place], [place] [address]    Special Requests   Final    BOTTLES DRAWN AEROBIC AND ANAEROBIC Blood Culture adequate volume Performed at [facility] [address] [place], [place] [address]    Culture  Setup Time   Final    GRAM POSITIVE COCCI IN CHAINS IN BOTH AEROBIC AND ANAEROBIC BOTTLES CRITICAL  RESULT CALLED TO, READ BACK BY AND VERIFIED WITH: PHARMD A PHAM [id number] AT 1340 BY CM Performed at [facility], [place] [address] [place], [place] [address]    Culture GROUP B STREP(S.AGALACTIAE)ISOLATED (A)  Final   Report Status 01/05/2022 FINAL  Final   Organism ID, Bacteria GROUP B STREP(S.AGALACTIAE)ISOLATED  Final      Susceptibility   Group b strep(s.agalactiae)isolated - MIC*    CLINDAMYCIN <=0.25 SENSITIVE Sensitive     AMPICILLIN <=0.25 SENSITIVE Sensitive     ERYTHROMYCIN <=0.12 SENSITIVE Sensitive     VANCOMYCIN 0.5 SENSITIVE Sensitive     CEFTRIAXONE <=0.12 SENSITIVE Sensitive     LEVOFLOXACIN 0.5 SENSITIVE Sensitive     PENICILLIN <=0.06 SENSITIVE Sensitive     * GROUP B STREP(S.AGALACTIAE)ISOLATED  Blood Culture ID Panel (Reflexed)     Status: Abnormal   Collection Time: 01/02/22 11:15 PM  Result Value Ref Range Status   Enterococcus faecalis NOT DETECTED NOT DETECTED Final   Enterococcus Faecium NOT DETECTED NOT DETECTED Final   Listeria monocytogenes NOT DETECTED NOT DETECTED  Final   Staphylococcus species NOT DETECTED NOT DETECTED Final   Staphylococcus aureus (BCID) NOT DETECTED NOT DETECTED Final   Staphylococcus epidermidis NOT DETECTED NOT DETECTED Final   Staphylococcus lugdunensis NOT DETECTED NOT DETECTED Final   Streptococcus species DETECTED (A) NOT DETECTED Final    Comment: CRITICAL RESULT CALLED TO, READ BACK BY AND VERIFIED WITH: PHARMD A PHAM [phone] AT 1340 BY CM    Streptococcus agalactiae DETECTED (A) NOT DETECTED Final    Comment: CRITICAL RESULT CALLED TO, READ BACK BY AND VERIFIED WITH: PHARMD A PHAM [phone] AT 1340 BY CM    Streptococcus pneumoniae NOT DETECTED NOT DETECTED Final   Streptococcus pyogenes NOT DETECTED NOT DETECTED Final   A.calcoaceticus-baumannii NOT DETECTED NOT DETECTED Final   Bacteroides fragilis NOT DETECTED NOT DETECTED Final   Enterobacterales NOT DETECTED NOT DETECTED Final   Enterobacter cloacae complex NOT DETECTED NOT DETECTED Final   Escherichia coli NOT DETECTED NOT DETECTED Final   Klebsiella aerogenes NOT DETECTED NOT DETECTED Final   Klebsiella oxytoca NOT DETECTED NOT DETECTED Final   Klebsiella pneumoniae NOT DETECTED NOT DETECTED Final   Proteus species NOT DETECTED NOT DETECTED Final   Salmonella species NOT DETECTED NOT DETECTED Final   Serratia marcescens NOT DETECTED NOT DETECTED Final   Haemophilus influenzae NOT DETECTED NOT DETECTED Final   Neisseria meningitidis NOT DETECTED NOT DETECTED Final   Pseudomonas aeruginosa NOT DETECTED NOT DETECTED Final   Stenotrophomonas maltophilia NOT DETECTED NOT DETECTED Final   Candida albicans NOT DETECTED NOT DETECTED Final   Candida auris NOT DETECTED NOT DETECTED Final   Candida glabrata NOT DETECTED NOT DETECTED Final   Candida krusei NOT DETECTED NOT DETECTED Final   Candida parapsilosis NOT DETECTED NOT DETECTED Final   Candida tropicalis NOT DETECTED NOT DETECTED Final   Cryptococcus neoformans/gattii NOT DETECTED NOT DETECTED Final    Comment:  Performed at [facility], [address] [place], [place] [address]  Blood Culture (routine x 2)     Status: Abnormal   Collection Time: 01/03/22 12:26 AM   Specimen: BLOOD  Result Value Ref Range Status   Specimen Description   Final    BLOOD BLOOD RIGHT HAND Performed at [facility] [address] [place], [place] [address]    Special Requests   Final    BOTTLES DRAWN AEROBIC AND ANAEROBIC Blood Culture adequate volume Performed at [facility]  [place] [address] [place], [place] [address]    Culture  Setup Time   Final    GRAM POSITIVE COCCI IN CHAINS IN BOTH AEROBIC AND ANAEROBIC BOTTLES CRITICAL RESULT CALLED TO, READ BACK BY AND VERIFIED WITH: PHARMD CRYSTAL ROBERTSON ON 01/03/22 @ 1557 BY DRT    Culture (A)  Final    GROUP B STREP(S.AGALACTIAE)ISOLATED SUSCEPTIBILITIES PERFORMED ON PREVIOUS CULTURE WITHIN THE LAST 5 DAYS. Performed at [facility], [place] [address] [place], [place] [address]    Report Status 01/05/2022 FINAL  Final  Resp Panel by RT-PCR (Flu A&B, Covid) Anterior Nasal Swab     Status: None   Collection Time: 01/03/22 12:31 AM   Specimen: Anterior Nasal Swab  Result Value Ref Range Status   SARS Coronavirus 2 by RT PCR NEGATIVE NEGATIVE Final    Comment: (NOTE) SARS-CoV-2 target nucleic acids are NOT DETECTED.  The SARS-CoV-2 RNA is generally detectable in upper respiratory specimens during the acute phase of infection. The lowest concentration of SARS-CoV-2 viral copies this assay can detect is 138 copies/mL. A negative result does not preclude SARS-Cov-2 infection and should not be used as the sole basis for treatment or other patient management decisions. A negative result may occur with  improper specimen collection/handling, submission of specimen other than nasopharyngeal swab, presence of viral mutation(s) within the areas targeted by this assay, and inadequate number of viral copies(<138  copies/mL). A negative result must be combined with clinical observations, patient history, and epidemiological information. The expected result is Negative.  Fact Sheet for Patients:  EntrepreneurPulse.com.au  Fact Sheet for Healthcare Providers:  IncredibleEmployment.be  This test is no t yet approved or cleared by the [place] FDA and  has been authorized for detection and/or diagnosis of SARS-CoV-2 by FDA under an Emergency Use Authorization (EUA). This EUA will remain  in effect (meaning this test can be used) for the duration of the COVID-19 declaration under Section 564(b)(1) of the Act, 21 U.S.C.section 360bbb-3(b)(1), unless the authorization is terminated  or revoked sooner.       Influenza A by PCR NEGATIVE NEGATIVE Final   Influenza B by PCR NEGATIVE NEGATIVE Final    Comment: (NOTE) The Xpert Xpress SARS-CoV-2/FLU/RSV plus assay is intended as an aid in the diagnosis of influenza from Nasopharyngeal swab specimens and should not be used as a sole basis for treatment. Nasal washings and aspirates are unacceptable for Xpert Xpress SARS-CoV-2/FLU/RSV testing.  Fact Sheet for Patients: EntrepreneurPulse.com.au  Fact Sheet for Healthcare Providers: IncredibleEmployment.be  This test is not yet approved or cleared by the [place] FDA and has been authorized for detection and/or diagnosis of SARS-CoV-2 by FDA under an Emergency Use Authorization (EUA). This EUA will remain in effect (meaning this test can be used) for the duration of the COVID-19 declaration under Section 564(b)(1) of the Act, 21 U.S.C. section 360bbb-3(b)(1), unless the authorization is terminated or revoked.  Performed at [facility], [place] [address], [place], [place] [address]   MRSA Next Gen by PCR, Nasal     Status: Abnormal   Collection Time: 01/03/22  2:50 AM   Specimen: Nasal Mucosa; Nasal  Swab  Result Value Ref Range Status   MRSA by PCR Next Gen DETECTED (A) NOT DETECTED Final    Comment: (NOTE) The GeneXpert MRSA Assay (FDA approved for NASAL specimens only), is one component of a comprehensive MRSA colonization surveillance program. It is not intended to diagnose MRSA infection nor  to guide or monitor treatment for MRSA infections. Test performance is not FDA approved in patients less than 39 years old. Performed at [facility], [place] [address] [place], [place] [address]   Respiratory (~20 pathogens) panel by PCR     Status: None   Collection Time: 01/03/22  9:00 AM   Specimen: Nasopharyngeal Swab; Respiratory  Result Value Ref Range Status   Adenovirus NOT DETECTED NOT DETECTED Final   Coronavirus 229E NOT DETECTED NOT DETECTED Final    Comment: (NOTE) The Coronavirus on the Respiratory Panel, DOES NOT test for the novel  Coronavirus (2019 nCoV)    Coronavirus HKU1 NOT DETECTED NOT DETECTED Final   Coronavirus NL63 NOT DETECTED NOT DETECTED Final   Coronavirus OC43 NOT DETECTED NOT DETECTED Final   Metapneumovirus NOT DETECTED NOT DETECTED Final   Rhinovirus / Enterovirus NOT DETECTED NOT DETECTED Final   Influenza A NOT DETECTED NOT DETECTED Final   Influenza B NOT DETECTED NOT DETECTED Final   Parainfluenza Virus 1 NOT DETECTED NOT DETECTED Final   Parainfluenza Virus 2 NOT DETECTED NOT DETECTED Final   Parainfluenza Virus 3 NOT DETECTED NOT DETECTED Final   Parainfluenza Virus 4 NOT DETECTED NOT DETECTED Final   Respiratory Syncytial Virus NOT DETECTED NOT DETECTED Final   Bordetella pertussis NOT DETECTED NOT DETECTED Final   Bordetella Parapertussis NOT DETECTED NOT DETECTED Final   Chlamydophila pneumoniae NOT DETECTED NOT DETECTED Final   Mycoplasma pneumoniae NOT DETECTED NOT DETECTED Final    Comment: Performed at [facility], [place]. [address], [place], [place] [address]  Urine Culture     Status: None   Collection Time:  01/03/22  9:48 AM   Specimen: Urine, Clean Catch  Result Value Ref Range Status   Specimen Description   Final    URINE, CLEAN CATCH Performed at [facility], [place] [address] [place], [place] [address]    Special Requests   Final    NONE Performed at [facility], [place] [address], [place], [place] [address]    Culture   Final    NO GROWTH Performed at [facility], [place] [address] [place], [place] [address]    Report Status 01/04/2022 FINAL  Final  Culture, blood (Routine X 2) w Reflex to ID Panel     Status: None (Preliminary result)   Collection Time: 01/04/22  7:36 AM   Specimen: BLOOD  Result Value Ref Range Status   Specimen Description   Final    BLOOD BLOOD LEFT ARM Performed at [facility] [address], [place], [place] [address]    Special Requests   Final    BOTTLES DRAWN AEROBIC ONLY Blood Culture adequate volume Performed at [facility] [address], [place], [place] [address]    Culture   Final    NO GROWTH 4 DAYS Performed at [facility], [place] [address] [place], [place] [address]    Report Status PENDING  Incomplete  Culture, blood (Routine X 2) w Reflex to ID Panel     Status: None (Preliminary result)   Collection Time: 01/04/22  7:46 AM   Specimen: BLOOD  Result Value Ref Range Status   Specimen Description   Final    BLOOD BLOOD RIGHT HAND Performed at [facility] [address] [place], [place] [address]    Special Requests   Final    BOTTLES DRAWN AEROBIC ONLY Blood Culture  adequate volume Performed at [facility] [address], [place], [place] [address]    Culture   Final    NO GROWTH 4 DAYS Performed at [facility], [place] [address], [place], [place] [address]    Report Status PENDING  Incomplete    Pertinent Lab.    Latest Ref Rng & Units 01/07/2022   12:13 PM 01/05/2022    2:45 AM 01/04/2022     5:42 AM  CBC  WBC 4.0 - 10.5 K/uL 6.2  7.4  13.6   Hemoglobin 12.0 - 15.0 g/dL 8.2  8.0  8.7   Hematocrit 36.0 - 46.0 % 28.7  26.5  30.1   Platelets 150 - 400 K/uL 317  266  292       Latest Ref Rng & Units 01/07/2022   12:13 PM 01/06/2022    1:13 AM 01/05/2022    2:45 AM  CMP  Glucose 70 - 99 mg/dL 248  128  164   BUN 8 - 23 mg/dL 71  82  79   Creatinine 0.44 - 1.00 mg/dL 2.73  2.87  3.06   Sodium 135 - 145 mmol/L 138  137  138   Potassium 3.5 - 5.1 mmol/L 4.4  5.0  4.0   Chloride 98 - 111 mmol/L 106  109  110   CO2 22 - 32 mmol/L _0 Calcium 8.9 - 10.3 mg/dL 8.9  8.8  8.6   Total Protein 6.5 - 8.1 g/dL 6.4     Total Bilirubin 0.3 - 1.2 mg/dL 0.3     Alkaline Phos 38 - 126 U/L 53     AST 15 - 41 U/L 12     ALT 0 - 44 U/L <5       Pertinent Imaging today Plain films and CT images have been personally visualized and interpreted; radiology reports have been reviewed. Decision making incorporated into the Impression / Recommendations.  I spent 65 minutes for this patient encounter including review of prior medical records, coordination of care with primary/other specialist with greater than 50% of time being face to face/counseling and discussing diagnostics/treatment plan with the patient/family.  Electronically signed by:   Rosiland Oz, MD Infectious Disease Physician [facility] Pager: [phone]

## 2022-01-08 NOTE — Progress Notes (Signed)
Discussed patient with anesthesia as well as ID.  Patient has GBS bacteremia with sepsis of unknown source but possibly related to cellulitis.  TTE showed no evidence of vegetation.  She also has moderate to severe AS with severe pulmonary HTN and cor pulmonale with moderately dilated and reduced RVF and PASP of 158mHg.    She is very high risk for general anesthesia or sedation given her severe pulmonary HTN and cor pulmonale and moderate to severe AS and risk for hemodynamic collapse.  I think the risks of TEE far outweight the benefits for detecting occult endocarditis.   I have discussed this with ID and feel that safer therapy would be to treat for an additional few weeks of antibx for possible endocarditis and cancel TEE.  I have spoken with Dr. MWest Baliwho agrees.  TEE has been cancelled.

## 2022-01-08 NOTE — Progress Notes (Addendum)
PROGRESS NOTE    [name] [name]  DXA:[id number] DOB: 05-07-1932 DOA: 01/02/2022 PCP: Unk Pinto, MD   Brief Narrative: [name] [name] is a 88 y.o. female with history of diabetes mellitus type 2, anemia of chronic disease, acquired hypothyroidism, stage IV CKD, seizures, moderate aortic stenosis, chronic diastolic heart failure.  Patient presented secondary to severe sepsis with concern for HCAP.  Empiric antibiotics initiated.  During admission, patient developed severe hypotension requiring admission to the ICU and vasopressor support.  Work-up was significant for group B strep (strep agalactiae) bacteremia with unclear source.  Infectious ease was consulted with concern for possible endocarditis as source for recurrent GBS bacteremia.  Transthoracic Echocardiogram was negative for etiology.  Plan for transesophageal echocardiogram to complete work-up and direct antibiotic therapy.  Hypotension improved with vasopressor support in addition to midodrine.  Vasopressors discontinued and patient discharged from ICU on 11/18.   Assessment and Plan:  Septic shock Present on admission. Secondary to GBS bacteremia. Associated lactic acidosis of 3.3 and hypotension requiring ICU admission and vasopressor support. Empiric antibiotics started. Midodrine started for management of hypotension. Norepinephrine discontinued on 11/17 and patient transferred out of the ICU on 11/18.  GBS bacteremia Recurrent. Unclear source but possibly secondary to cellulitis vs endocarditis. ID consulted. Transthoracic Echocardiogram ordered on 11/17 and was not positive for vegetation. Transesophageal Echocardiogram ordered but canceled secondary to severe pulmonary hypertension with cor pulmonale.  -ID recommendations: Cefazolin IV. Plan for 6 weeks of antibiotic treatment -IR consult for tunneled catheter; will avoid PICC although unlikely patient would be a dialysis candidate  AKI on CKD stage IV AKI likely  secondary to shock. Baseline creatinine of about 2. Creatinine of 2.72 on admission, up to a peak of 2.09. complicated by acute urinary retention, although no hydronephrosis noted on CT imaging. Foley placed. Creatinine improved to 2.73 today. Slow recovery. Good urine output. -BMP in AM -Adjust medications for renal function  Acute urinary retention Unclear etiology. Foley catheter placed on 11/16.  Acute on chronic diastolic heart failure Recent Transthoracic Echocardiogram significant for an LVEF of 60-65% and grade II diastolic dysfunction.  Somnolence Patient unsure of etiology. Symptoms since yesterday. No new medications that are likely contributing. Resolved.  Hypothyroidism -Continue synthroid  Normocytic anemia Hemoglobin currently stable. Patient follows with Irene [facility]bo.  Severe pulmonary hypertension Noted on Transthoracic Echocardiogram with associated moderate RV dilation and reduced function. Patient will need close cardiology follow-up.  RV overload Suggestive from Transthoracic Echocardiogram. Patient started on Lasix trial. Possibly contributing to overall hypotension which has now resolved. Recent Transthoracic Echocardiogram from 09/2021 with normal RV EF. D-dimer elevated but low risk when adjusted for age. No other symptoms concerning for PE. Patient will need cardiology follow-up.  Gallbladder wall thickening Noted on CT imaging. No correlative symptoms or labs.  Aortic stenosis Moderate-severe, noted on Transthoracic Echocardiogram.  Dizziness Per patient report when standing. Likely related to hypotension. -Continue PT  History of JAK2 positive myeloproliferative disorder Platelets stable. -Continue aspirin  DVT prophylaxis: SCDs Code Status:   Code Status: DNR Family Communication: None at bedside Disposition Plan: Discharge to SNF likely on 11/22 once final antibiotic regimen is available   Consultants:  PCCM Infectious disease  Procedures:   Transthoracic Echocardiogram  Antimicrobials: Vancomyicin Ceftriaxone Cefepime Azithromycin Cefazolin    Subjective: Patient states that she has now had some trouble with sleeping. She is no longer hypersomnolent.   Objective: BP (!) 144/52   Pulse 79   Temp 98 F (36.7 C) (Temporal)  Resp 15   Ht '5\' 2"'$  (1.575 m)   Wt 83.3 kg   SpO2 93%   BMI 33.59 kg/m   Examination:  General exam: Appears calm and comfortable Respiratory system: Clear to auscultation. Respiratory effort normal. Cardiovascular system: S1 & S2 heard, RRR. 3/6 systolic murmur Gastrointestinal system: Abdomen is nondistended, soft and nontender. Normal bowel sounds heard. Central nervous system: Alert and oriented. No focal neurological deficits. Musculoskeletal: No calf tenderness Skin: No cyanosis. No rashes Psychiatry: Judgement and insight appear normal. Mood & affect appropriate.    Data Reviewed: I have personally reviewed following labs and imaging studies  CBC Lab Results  Component Value Date   WBC 6.2 01/07/2022   RBC 3.25 (L) 01/07/2022   HGB 8.2 (L) 01/07/2022   HCT 28.7 (L) 01/07/2022   MCV 88.3 01/07/2022   MCH 25.2 (L) 01/07/2022   PLT 317 01/07/2022   MCHC 28.6 (L) 01/07/2022   RDW 21.0 (H) 01/07/2022   LYMPHSABS 0.3 (L) 01/03/2022   MONOABS 0.8 01/03/2022   EOSABS 0.1 01/03/2022   BASOSABS 0.0 77/82/4235     Last metabolic panel Lab Results  Component Value Date   NA 138 01/07/2022   K 4.4 01/07/2022   CL 106 01/07/2022   CO2 23 01/07/2022   BUN 71 (H) 01/07/2022   CREATININE 2.73 (H) 01/07/2022   GLUCOSE 248 (H) 01/07/2022   GFRNONAA 16 (L) 01/07/2022   GFRAA 28 (L) 05/05/2020   CALCIUM 8.9 01/07/2022   PHOS 5.2 (H) 01/04/2022   PROT 6.4 (L) 01/07/2022   ALBUMIN 3.3 (L) 01/07/2022   LABGLOB 2.4 05/03/2019   BILITOT 0.3 01/07/2022   ALKPHOS 53 01/07/2022   AST 12 (L) 01/07/2022   ALT <5 01/07/2022   ANIONGAP 9 01/07/2022    GFR: Estimated Creatinine  Clearance: 14.5 mL/min (A) (by C-G formula based on SCr of 2.73 mg/dL (H)).  Recent Results (from the past 240 hour(s))  Blood Culture (routine x 2)     Status: Abnormal   Collection Time: 01/02/22 11:15 PM   Specimen: BLOOD  Result Value Ref Range Status   Specimen Description   Final    BLOOD RIGHT ANTECUBITAL Performed at [facility] [address], [place], [place] [address]    Special Requests   Final    BOTTLES DRAWN AEROBIC AND ANAEROBIC Blood Culture adequate volume Performed at [facility] [address], [place], [place] [address]    Culture  Setup Time   Final    GRAM POSITIVE COCCI IN CHAINS IN BOTH AEROBIC AND ANAEROBIC BOTTLES CRITICAL RESULT CALLED TO, READ BACK BY AND VERIFIED WITH: PHARMD A PHAM [id number] AT 1340 BY CM Performed at [facility], [place] [address], [place], [place] [address]    Culture GROUP B STREP(S.AGALACTIAE)ISOLATED (A)  Final   Report Status 01/05/2022 FINAL  Final   Organism ID, Bacteria GROUP B STREP(S.AGALACTIAE)ISOLATED  Final      Susceptibility   Group b strep(s.agalactiae)isolated - MIC*    CLINDAMYCIN <=0.25 SENSITIVE Sensitive     AMPICILLIN <=0.25 SENSITIVE Sensitive     ERYTHROMYCIN <=0.12 SENSITIVE Sensitive     VANCOMYCIN 0.5 SENSITIVE Sensitive     CEFTRIAXONE <=0.12 SENSITIVE Sensitive     LEVOFLOXACIN 0.5 SENSITIVE Sensitive     PENICILLIN <=0.06 SENSITIVE Sensitive     * GROUP B STREP(S.AGALACTIAE)ISOLATED  Blood Culture ID Panel (Reflexed)     Status: Abnormal   Collection Time: 01/02/22 11:15 PM  Result  Value Ref Range Status   Enterococcus faecalis NOT DETECTED NOT DETECTED Final   Enterococcus Faecium NOT DETECTED NOT DETECTED Final   Listeria monocytogenes NOT DETECTED NOT DETECTED Final   Staphylococcus species NOT DETECTED NOT DETECTED Final   Staphylococcus aureus (BCID) NOT DETECTED NOT DETECTED Final   Staphylococcus epidermidis NOT DETECTED NOT DETECTED Final    Staphylococcus lugdunensis NOT DETECTED NOT DETECTED Final   Streptococcus species DETECTED (A) NOT DETECTED Final    Comment: CRITICAL RESULT CALLED TO, READ BACK BY AND VERIFIED WITH: PHARMD A PHAM [id number] AT 1340 BY CM    Streptococcus agalactiae DETECTED (A) NOT DETECTED Final    Comment: CRITICAL RESULT CALLED TO, READ BACK BY AND VERIFIED WITH: PHARMD A PHAM [id number] AT 1340 BY CM    Streptococcus pneumoniae NOT DETECTED NOT DETECTED Final   Streptococcus pyogenes NOT DETECTED NOT DETECTED Final   A.calcoaceticus-baumannii NOT DETECTED NOT DETECTED Final   Bacteroides fragilis NOT DETECTED NOT DETECTED Final   Enterobacterales NOT DETECTED NOT DETECTED Final   Enterobacter cloacae complex NOT DETECTED NOT DETECTED Final   Escherichia coli NOT DETECTED NOT DETECTED Final   Klebsiella aerogenes NOT DETECTED NOT DETECTED Final   Klebsiella oxytoca NOT DETECTED NOT DETECTED Final   Klebsiella pneumoniae NOT DETECTED NOT DETECTED Final   Proteus species NOT DETECTED NOT DETECTED Final   Salmonella species NOT DETECTED NOT DETECTED Final   Serratia marcescens NOT DETECTED NOT DETECTED Final   Haemophilus influenzae NOT DETECTED NOT DETECTED Final   Neisseria meningitidis NOT DETECTED NOT DETECTED Final   Pseudomonas aeruginosa NOT DETECTED NOT DETECTED Final   Stenotrophomonas maltophilia NOT DETECTED NOT DETECTED Final   Candida albicans NOT DETECTED NOT DETECTED Final   Candida auris NOT DETECTED NOT DETECTED Final   Candida glabrata NOT DETECTED NOT DETECTED Final   Candida krusei NOT DETECTED NOT DETECTED Final   Candida parapsilosis NOT DETECTED NOT DETECTED Final   Candida tropicalis NOT DETECTED NOT DETECTED Final   Cryptococcus neoformans/gattii NOT DETECTED NOT DETECTED Final    Comment: Performed at [facility], [address] [place], [place] [address]  Blood Culture (routine x 2)     Status: Abnormal   Collection Time: 01/03/22 12:26 AM   Specimen: BLOOD  Result  Value Ref Range Status   Specimen Description   Final    BLOOD BLOOD RIGHT HAND Performed at [facility] [address] [place], [place] [address]    Special Requests   Final    BOTTLES DRAWN AEROBIC AND ANAEROBIC Blood Culture adequate volume Performed at [facility] [address], [place], [place] [address]    Culture  Setup Time   Final    GRAM POSITIVE COCCI IN CHAINS IN BOTH AEROBIC AND ANAEROBIC BOTTLES CRITICAL RESULT CALLED TO, READ BACK BY AND VERIFIED WITH: PHARMD CRYSTAL ROBERTSON ON 01/03/22 @ 1557 BY DRT    Culture (A)  Final    GROUP B STREP(S.AGALACTIAE)ISOLATED SUSCEPTIBILITIES PERFORMED ON PREVIOUS CULTURE WITHIN THE LAST 5 DAYS. Performed at [facility], [place] [address] [place], [place] [address]    Report Status 01/05/2022 FINAL  Final  Resp Panel by RT-PCR (Flu A&B, Covid) Anterior Nasal Swab     Status: None   Collection Time: 01/03/22 12:31 AM   Specimen: Anterior Nasal Swab  Result Value Ref Range Status   SARS Coronavirus 2 by RT PCR NEGATIVE NEGATIVE Final    Comment: (NOTE) SARS-CoV-2 target nucleic acids are  NOT DETECTED.  The SARS-CoV-2 RNA is generally detectable in upper respiratory specimens during the acute phase of infection. The lowest concentration of SARS-CoV-2 viral copies this assay can detect is 138 copies/mL. A negative result does not preclude SARS-Cov-2 infection and should not be used as the sole basis for treatment or other patient management decisions. A negative result may occur with  improper specimen collection/handling, submission of specimen other than nasopharyngeal swab, presence of viral mutation(s) within the areas targeted by this assay, and inadequate number of viral copies(<138 copies/mL). A negative result must be combined with clinical observations, patient history, and epidemiological information. The expected result is Negative.  Fact Sheet for Patients:   EntrepreneurPulse.com.au  Fact Sheet for Healthcare Providers:  IncredibleEmployment.be  This test is no t yet approved or cleared by the [place] FDA and  has been authorized for detection and/or diagnosis of SARS-CoV-2 by FDA under an Emergency Use Authorization (EUA). This EUA will remain  in effect (meaning this test can be used) for the duration of the COVID-19 declaration under Section 564(b)(1) of the Act, 21 U.S.C.section 360bbb-3(b)(1), unless the authorization is terminated  or revoked sooner.       Influenza A by PCR NEGATIVE NEGATIVE Final   Influenza B by PCR NEGATIVE NEGATIVE Final    Comment: (NOTE) The Xpert Xpress SARS-CoV-2/FLU/RSV plus assay is intended as an aid in the diagnosis of influenza from Nasopharyngeal swab specimens and should not be used as a sole basis for treatment. Nasal washings and aspirates are unacceptable for Xpert Xpress SARS-CoV-2/FLU/RSV testing.  Fact Sheet for Patients: EntrepreneurPulse.com.au  Fact Sheet for Healthcare Providers: IncredibleEmployment.be  This test is not yet approved or cleared by the [place] FDA and has been authorized for detection and/or diagnosis of SARS-CoV-2 by FDA under an Emergency Use Authorization (EUA). This EUA will remain in effect (meaning this test can be used) for the duration of the COVID-19 declaration under Section 564(b)(1) of the Act, 21 U.S.C. section 360bbb-3(b)(1), unless the authorization is terminated or revoked.  Performed at [facility], [place] [address] [place], [place] [address]   MRSA Next Gen by PCR, Nasal     Status: Abnormal   Collection Time: 01/03/22  2:50 AM   Specimen: Nasal Mucosa; Nasal Swab  Result Value Ref Range Status   MRSA by PCR Next Gen DETECTED (A) NOT DETECTED Final    Comment: (NOTE) The GeneXpert MRSA Assay (FDA approved for NASAL specimens only), is one  component of a comprehensive MRSA colonization surveillance program. It is not intended to diagnose MRSA infection nor to guide or monitor treatment for MRSA infections. Test performance is not FDA approved in patients less than 50 years old. Performed at [facility], [place] [address], [place], [place] [address]   Respiratory (~20 pathogens) panel by PCR     Status: None   Collection Time: 01/03/22  9:00 AM   Specimen: Nasopharyngeal Swab; Respiratory  Result Value Ref Range Status   Adenovirus NOT DETECTED NOT DETECTED Final   Coronavirus 229E NOT DETECTED NOT DETECTED Final    Comment: (NOTE) The Coronavirus on the Respiratory Panel, DOES NOT test for the novel  Coronavirus (2019 nCoV)    Coronavirus HKU1 NOT DETECTED NOT DETECTED Final   Coronavirus NL63 NOT DETECTED NOT DETECTED Final   Coronavirus OC43 NOT DETECTED NOT DETECTED Final   Metapneumovirus NOT DETECTED NOT DETECTED Final   Rhinovirus / Enterovirus NOT DETECTED NOT DETECTED Final   Influenza  A NOT DETECTED NOT DETECTED Final   Influenza B NOT DETECTED NOT DETECTED Final   Parainfluenza Virus 1 NOT DETECTED NOT DETECTED Final   Parainfluenza Virus 2 NOT DETECTED NOT DETECTED Final   Parainfluenza Virus 3 NOT DETECTED NOT DETECTED Final   Parainfluenza Virus 4 NOT DETECTED NOT DETECTED Final   Respiratory Syncytial Virus NOT DETECTED NOT DETECTED Final   Bordetella pertussis NOT DETECTED NOT DETECTED Final   Bordetella Parapertussis NOT DETECTED NOT DETECTED Final   Chlamydophila pneumoniae NOT DETECTED NOT DETECTED Final   Mycoplasma pneumoniae NOT DETECTED NOT DETECTED Final    Comment: Performed at [facility], [place] [address], [place], [place] [address]  Urine Culture     Status: None   Collection Time: 01/03/22  9:48 AM   Specimen: Urine, Clean Catch  Result Value Ref Range Status   Specimen Description   Final    URINE, CLEAN CATCH Performed at [facility], [place]  [address] [place], [place] [address]    Special Requests   Final    NONE Performed at [facility], [place] [address] [place], [place] [address]    Culture   Final    NO GROWTH Performed at [facility], [place] [address] [place], [place] [address]    Report Status 01/04/2022 FINAL  Final  Culture, blood (Routine X 2) w Reflex to ID Panel     Status: None (Preliminary result)   Collection Time: 01/04/22  7:36 AM   Specimen: BLOOD  Result Value Ref Range Status   Specimen Description   Final    BLOOD BLOOD LEFT ARM Performed at [facility] [address], [place], [place] [address]    Special Requests   Final    BOTTLES DRAWN AEROBIC ONLY Blood Culture adequate volume Performed at [facility] [address] [place], [place] [address]    Culture   Final    NO GROWTH 4 DAYS Performed at [facility], [place] [address] [place], [place] [address]    Report Status PENDING  Incomplete  Culture, blood (Routine X 2) w Reflex to ID Panel     Status: None (Preliminary result)   Collection Time: 01/04/22  7:46 AM   Specimen: BLOOD  Result Value Ref Range Status   Specimen Description   Final    BLOOD BLOOD RIGHT HAND Performed at [facility] [address] [place], [place] [address]    Special Requests   Final    BOTTLES DRAWN AEROBIC ONLY Blood Culture adequate volume Performed at [facility] [address], [place], [place] [address]    Culture   Final    NO GROWTH 4 DAYS Performed at [facility], [place] [address], [place],  [address]    Report Status PENDING  Incomplete      Radiology Studies: No results found.    LOS: 5 days    Cordelia Poche, MD [facility] 01/08/2022, 12:50 PM   If 7PM-7AM, please contact night-coverage [email or web address]

## 2022-01-08 NOTE — Anesthesia Preprocedure Evaluation (Deleted)
Anesthesia Evaluation    Reviewed: Allergy & Precautions, Patient's Chart, lab work & pertinent test results  History of Anesthesia Complications Negative for: history of anesthetic complications  Airway        Dental   Pulmonary sleep apnea , former smoker          Cardiovascular hypertension, +CHF    TTE 01/04/22: RV moderately dilated with moderately reduced function, severe pHTN (RVSP 99), moderate to severe AS (AVA 1.02 cm, gradient 36.0 mmHg, Vmax 3.53ms), LVEF 60-65%, grade 2 DD, mild to moderate RAE, mild to moderate TR   Neuro/Psych Seizures -,     GI/Hepatic Neg liver ROS,GERD  ,,  Endo/Other  diabetes, Type 2Hypothyroidism    Renal/GU Renal InsufficiencyRenal disease (Cr 2.73)  negative genitourinary   Musculoskeletal  (+) Arthritis ,    Abdominal   Peds  Hematology  (+) Blood dyscrasia (Hgb 8.2, INR 1.7), anemia   Anesthesia Other Findings Day of surgery medications reviewed with patient.  Reproductive/Obstetrics negative OB ROS                             Anesthesia Physical Anesthesia Plan  ASA: 4  Anesthesia Plan: MAC   Post-op Pain Management: Minimal or no pain anticipated   Induction:   PONV Risk Score and Plan: Treatment may vary due to age or medical condition and Propofol infusion  Airway Management Planned: Natural Airway and Simple Face Mask  Additional Equipment: None  Intra-op Plan:   Post-operative Plan:   Informed Consent:    Patient has DNR.     Plan Discussed with:   Anesthesia Plan Comments:         Anesthesia Quick Evaluation

## 2022-01-09 ENCOUNTER — Inpatient Hospital Stay (HOSPITAL_COMMUNITY): Payer: Medicare Other

## 2022-01-09 DIAGNOSIS — A419 Sepsis, unspecified organism: Secondary | ICD-10-CM | POA: Diagnosis not present

## 2022-01-09 DIAGNOSIS — R6520 Severe sepsis without septic shock: Secondary | ICD-10-CM | POA: Diagnosis not present

## 2022-01-09 HISTORY — PX: IR US GUIDE VASC ACCESS RIGHT: IMG2390

## 2022-01-09 HISTORY — PX: IR FLUORO GUIDE CV LINE RIGHT: IMG2283

## 2022-01-09 HISTORY — PX: IR PERC TUN PERIT CATH WO PORT S&I /IMAG: IMG2327

## 2022-01-09 LAB — CULTURE, BLOOD (ROUTINE X 2)
Culture: NO GROWTH
Culture: NO GROWTH
Special Requests: ADEQUATE
Special Requests: ADEQUATE

## 2022-01-09 LAB — BASIC METABOLIC PANEL
Anion gap: 7 (ref 5–15)
BUN: 58 mg/dL — ABNORMAL HIGH (ref 8–23)
CO2: 21 mmol/L — ABNORMAL LOW (ref 22–32)
Calcium: 9.7 mg/dL (ref 8.9–10.3)
Chloride: 117 mmol/L — ABNORMAL HIGH (ref 98–111)
Creatinine, Ser: 2.17 mg/dL — ABNORMAL HIGH (ref 0.44–1.00)
GFR, Estimated: 22 mL/min — ABNORMAL LOW (ref >60)
Glucose, Bld: 133 mg/dL — ABNORMAL HIGH (ref 70–99)
Potassium: 4.5 mmol/L (ref 3.5–5.1)
Sodium: 145 mmol/L (ref 135–145)

## 2022-01-09 LAB — GLUCOSE, CAPILLARY
Glucose-Capillary: 120 mg/dL — ABNORMAL HIGH (ref 70–99)
Glucose-Capillary: 127 mg/dL — ABNORMAL HIGH (ref 70–99)
Glucose-Capillary: 165 mg/dL — ABNORMAL HIGH (ref 70–99)
Glucose-Capillary: 225 mg/dL — ABNORMAL HIGH (ref 70–99)
Glucose-Capillary: 238 mg/dL — ABNORMAL HIGH (ref 70–99)

## 2022-01-09 MED ORDER — LIDOCAINE HCL 1 % IJ SOLN
INTRAMUSCULAR | Status: AC
  Filled 2022-01-09: qty 20

## 2022-01-09 MED ORDER — MIDODRINE HCL 10 MG PO TABS: 10.0000 mg | ORAL_TABLET | Freq: Three times a day (TID) | ORAL | Status: DC

## 2022-01-09 MED ORDER — HEPARIN SOD (PORK) LOCK FLUSH 100 UNIT/ML IV SOLN
INTRAVENOUS | Status: AC
  Filled 2022-01-09: qty 5

## 2022-01-09 MED ORDER — CEFTRIAXONE IV (FOR PTA / DISCHARGE USE ONLY): 2.0000 g | INTRAVENOUS | 0 refills | Status: AC

## 2022-01-09 MED ORDER — HUMULIN 70/30 (70-30) 100 UNIT/ML ~~LOC~~ SUSP: 8.0000 [IU] | Freq: Every day | SUBCUTANEOUS | 3 refills | Status: DC

## 2022-01-09 NOTE — TOC Progression Note (Addendum)
Transition of Care [facility]) - Progression Note    Patient Details  Name: [name] [name] MRN: [id number] Date of Birth: 1932-11-08  Transition of Care [facility]) CM/SW Contact  Leeroy Cha, RN Phone Number: 01/09/2022, 10:50 AM  Clinical Narrative:    [facility] Go at [facility].  Yes patient can come today after the picc line is in place.  Picc is to be placed today in ir. Transport package made ready for transport via [facility] to [facility] when picc line is in.  Expected Discharge Plan: [place] Barriers to Discharge: Continued Medical Work up  Expected Discharge Plan and Services Expected Discharge Plan: [place] In-house Referral: NA Discharge Planning Services: CM Consult   Living arrangements for the past 2 months: [place]                 DME Arranged: N/A DME Agency: NA       HH Arranged: NA HH Agency: NA         Social Determinants of Health (SDOH) Interventions    Readmission Risk Interventions   No data to display

## 2022-01-09 NOTE — Procedures (Signed)
Interventional Radiology Procedure:   Indications: Bacteremia and needs long term IV antibiotics. CKD.  Procedure: Tunneled central line placement  Findings: Right jugular single lumen Powerline, 23 cm length, tip at SVC/RA junction.   Complications: No immediate complications noted.     EBL: Minimal  Plan: Central line is ready for use.    Jarian Longoria R. Anselm Pancoast, MD  Pager: [phone]

## 2022-01-09 NOTE — Progress Notes (Signed)
Attempted to call [facility] multiple times [phone] for report to the nurse but this number and option 0 for operator only prompted to leave a VM. Pt is waiting to be pick up by [facility] transport.

## 2022-01-09 NOTE — Progress Notes (Signed)
Pt has some bleeding at site of tunneled central line that was placed this afternoon. Unit RN to monitor and consult IV team if bleeding continues. At this time bleeding is controlled under dressing. Suggest dressing change in AM

## 2022-01-09 NOTE — Discharge Summary (Addendum)
Physician Discharge Summary   Patient: [name] [name] MRN: [id number] DOB: 09-Mar-1932  Admit date:     01/02/2022  Discharge date: 01/11/22  Discharge Physician: Edwin Dada   PCP: Unk Pinto, MD     Recommendations at discharge:  Follow up with PCP Dr. Melford Aase within 1 week fo SNF discharge Follow up with Infectious Disease Dr. 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 F with hx DM, obesity BMI 34, anemia of CKD, CKD IV baseline Cr 2, history seizures, moderate aortic stenosis, and dCHF who presented with secondary fever, malaise, cough, chills, initial concern for HCAP.          Septic shock Patient presented with tachypnea, WBC 13K, Cr 2.7, and hypotension requiring vasopressors.    Work-up was significant for group B strep (strep agalactiae) bacteremia.  Infectious disease was consulted with concern for possible endocarditis as source for recurrent GBS bacteremia.   Transthoracic echocardiogram was negative vegetation. Cardiology were consulted and felt TEE was too high risk given her comorbidities and the decision was made to treat with Rocephin for 6 weeks for suspected infective endocarditis.   See Dr. Levonne Spiller note for reasoning behind avoiding penicillin and gentamicin.     AKI on CKD stage IV AKI likely secondary to shock. Baseline creatinine of about 2. Creatinine of 2.72 on admission, up to a peak of 3.06. Complicated by acute urinary retention, although no hydronephrosis noted on CT imaging. Foley placed. Creatinine resolved to 2.1 at the time of discharge. Discharged without foley.         Chronic diastolic heart failure Acute heart failure ruled out.     Hypothyroidism   Normocytic anemia Hemoglobin currently stable. Patient follows with [name] [name].   Severe pulmonary hypertension Noted on Transthoracic Echocardiogram with associated moderate RV dilation and reduced function. Patient will need close cardiology follow-up.   History of JAK2 positive myeloproliferative disorder Platelets stable.          The [facility] Controlled Substances Registry was reviewed for this patient prior to discharge.   Consultants: Cardiology, infectious disease Procedures performed:  Echocardiogram Tunneled Catheter placement   Disposition: Skilled nursing facility   DISCHARGE MEDICATION: Allergies as of 01/09/2022       Reactions   Ppd [tuberculin Purified Protein Derivative] Other (See Comments)   indurated        Medication List     STOP taking these medications    atenolol 50 MG tablet Commonly known as: TENORMIN   furosemide 40 MG tablet Commonly  known as: LASIX       TAKE these medications    acetaminophen 500 MG tablet Commonly known as: TYLENOL Take 1,000 mg by mouth every 6 (six) hours as needed for moderate pain.   allopurinol 100 MG tablet Commonly known as: ZYLOPRIM Take 1 tablet (100 mg total) by mouth 2  (two) times daily. Take  1 tablet  Daily  to prevent Gout.   ascorbic acid 500 MG tablet Commonly known as: VITAMIN C Take 500 mg by mouth daily.   aspirin 81 MG tablet Take 81 mg by mouth daily.   BD Veo Insulin Syringe U/F 31G X 15/64" 0.3 ML Misc Generic drug: Insulin Syringe-Needle U-100 2 (two) times daily.   cefTRIAXone  IVPB Commonly known as: ROCEPHIN Inject 2 g into the vein daily. Indication:  GBS bacteremia - empiric IE treatment  First Dose: Yes Last Day of Therapy:  02/15/2022 Labs - Once weekly:  CBC/D and BMP, Labs - Every other week:  ESR and CRP Method of administration: IV Push Refer to interventional radiology for tunneled catheter removal at the completion of IV therapy  Method of administration may be changed at the discretion of home infusion pharmacist based upon assessment of the patient and/or caregiver's ability to self-administer the medication ordered.   cholecalciferol 25 MCG (1000 UNIT) tablet Commonly known as: VITAMIN D3 Take 3,000 Units by mouth daily.   fish oil-omega-3 fatty acids 1000 MG capsule Take 1,000 mg by mouth at bedtime.   HumuLIN 70/30 (70-30) 100 UNIT/ML injection Generic drug: insulin NPH-regular Human Inject 8 Units into the skin daily with breakfast. What changed: See the new instructions.   hydrocortisone 2.5 % rectal cream Commonly known as: ANUSOL-HC Place rectally 2 (two) times daily.   lansoprazole 30 MG capsule Commonly known as: PREVACID TAKE 1 CAPSULE BY MOUTH DAILY TO PREVENT HEARTBURN AND INDIGESTION. What changed:  how much to take how to take this when to take this additional instructions   levETIRAcetam 500 MG tablet Commonly known as: KEPPRA Take  1 tablet 2  x /day  with Meals  to Prevent Seizures What changed: additional instructions   levothyroxine 100 MCG tablet Commonly known as: SYNTHROID TAKE 1 TABLET BY MOUTH DAILY ON AN EMPTY STOMACH WITH ONLY WATER FOR 30 MINUTES. NO ANTACIDS, CALCIUM OR  MAGNESIUM FOR 4 HOURS. AVOID BIOTIN What changed: See the new instructions.   loratadine 10 MG tablet Commonly known as: CLARITIN Take 10 mg by mouth daily.   meclizine 25 MG tablet Commonly known as: ANTIVERT Take   1 tablet 2 to 3 x /day  ONLY  if needed for Dizziness /Vertigo   midodrine 10 MG tablet Commonly known as: PROAMATINE Take 1 tablet (10 mg total) by mouth 3 (three) times daily with meals.   onetouch ultrasoft lancets Check Blood Sugar  4 x /day  before Meals & Bedtime   (( Dx-  e11.22 ))   OneTouch Verio test strip Generic drug: glucose blood Check Blood Sugar 4 x /day  (( Dx-  e11.22 ))   OneTouch Verio w/Device Kit Check  Blood Sugar   4 x /day before Meals & Bedtime   (( Dx-  e11.22 ))   rOPINIRole 1 MG tablet Commonly known as: REQUIP TAKE ONE-HALF TO ONE TABLET BY MOUTH THREE TIMES DAILY AS NEEDED FOR RESTLESS LEGS& CRAMPS   vitamin B-12 100 MCG tablet Commonly known as: CYANOCOBALAMIN Take 500 mcg by mouth daily.  Discharge Care Instructions  (From admission, onward)           Start     Ordered   01/09/22 0000  Change dressing on IV access line weekly and PRN  (Home infusion instructions - [facility] )        01/09/22 1712   01/09/22 0000  Discharge wound care:       Comments: As directed by Interventional Radiology   01/09/22 1712            Follow-up Information     Unk Pinto, MD Follow up.   Specialty: Internal Medicine Contact information: [address] [place] [address] [phone]         Rosiland Oz, MD Follow up.   Specialty: Infectious Diseases Contact information: [address] [place] [address] [phone]         Markus Daft, MD Follow up.   Specialties: Interventional Radiology, Radiology Contact information: [address] [place] [address] [phone]                 Discharge Instructions      [facility] pharmacist to adjust dose for Vancomycin, Aminoglycosides and other anti-infective therapies as requested by physician.   Complete by: As directed    [facility] to provide Cath Flo 59m   Complete by: As directed    Administer for PICC line occlusion and as ordered by physician for other access device issues.   Anaphylaxis Kit: Provided to treat any anaphylactic reaction to the medication being provided to the patient if First Dose or when requested by physician   Complete by: As directed    Epinephrine 136mml vial / amp: Administer 0.63m67m0.63ml663mubcutaneously once for moderate to severe anaphylaxis, nurse to call physician and pharmacy when reaction occurs and call 911 if needed for immediate care   Diphenhydramine 50mg59mIV vial: Administer 25-50mg 48mM PRN for first dose reaction, rash, itching, mild reaction, nurse to call physician and pharmacy when reaction occurs   Sodium Chloride 0.9% NS 500ml I12mdminister if needed for hypovolemic blood pressure drop or as ordered by physician after call to physician with anaphylactic reaction   Change dressing on IV access line weekly and PRN   Complete by: As directed    Discharge wound care:   Complete by: As directed    As directed by Interventional Radiology   Flush IV access with Sodium Chloride 0.9% and Heparin 10 units/ml or 100 units/ml   Complete by: As directed    Home infusion instructions - [facility]   Complete by: As directed    Instructions: Flush IV access with Sodium Chloride 0.9% and Heparin 10units/ml or 100units/ml   Change dressing on IV access line: Weekly and PRN   Instructions Cath Flo 2mg: Ad67mister for PICC Line occlusion and as ordered by physician for other access device   [facility] pharmacist to adjust dose for: Vancomycin, Aminoglycosides and other anti-infective therapies as requested by physician   Increase activity slowly   Complete by: As  directed    Method of administration may be changed at the discretion of home infusion pharmacist based upon assessment of the patient and/or caregiver's ability to self-administer the medication ordered   Complete by: As directed        Discharge Exam: Filed Weights   01/07/22 0500 01/08/22 0222 01/09/22 0500  Weight: 84.7 kg 83.3 kg 84.6 kg  General: Pt is alert, awake, not in acute distress Cardiovascular: RRR, nl S1-S2.   No LE edema.   Respiratory: Normal respiratory rate and rhythm.  CTAB without rales or wheezes. Abdominal: Abdomen soft and non-tender.  No distension or HSM.   Neuro/Psych: Strength symmetric in upper and lower extremities.  Judgment and insight appear normal.   Condition at discharge: stable  The results of significant diagnostics from this hospitalization (including imaging, microbiology, ancillary and laboratory) are listed below for reference.   Imaging Studies: [place] EKG SITE RITE  Result Date: 01/08/2022 If Site Rite image not attached, placement could not be confirmed due to current cardiac rhythm.  CT ABDOMEN PELVIS WO CONTRAST  Result Date: 01/04/2022 CLINICAL DATA:  Left upper quadrant pain EXAM: CT ABDOMEN AND PELVIS WITHOUT CONTRAST TECHNIQUE: Multidetector CT imaging of the abdomen and pelvis was performed following the standard protocol without IV contrast. RADIATION DOSE REDUCTION: This exam was performed according to the departmental dose-optimization program which includes automated exposure control, adjustment of the mA and/or kV according to patient size and/or use of iterative reconstruction technique. COMPARISON:  None Available. FINDINGS: Lower chest: Small to moderate bilateral pleural effusions. Compressive atelectasis in the lower lobes. Small hiatal hernia. Hepatobiliary: Gallbladder is contracted. There appears to be gallbladder wall thickening. No visible ductal dilatation or focal hepatic abnormality. Pancreas: No focal abnormality  or ductal dilatation. Spleen: Splenomegaly with a craniocaudal length of 17 cm. Adrenals/Urinary Tract: Bilateral cortical thinning. No stones or hydronephrosis. Adrenal glands unremarkable. Urinary bladder decompressed with Foley catheter in place. Stomach/Bowel: Moderate stool burden. Stomach, large and small bowel grossly unremarkable. Vascular/Lymphatic: Aortic atherosclerosis. No evidence of aneurysm or adenopathy. Reproductive: Uterus and adnexa unremarkable.  No mass. Other: No free fluid or free air. Musculoskeletal: No acute bony abnormality. IMPRESSION: Small to moderate bilateral pleural effusions. Compressive atelectasis in the lower lobes. Small hiatal hernia. Splenomegaly. Gallbladder is contracted. There appears to be gallbladder wall thickening. This could be further evaluated with right upper quadrant ultrasound if felt clinically indicated. Aortic atherosclerosis. Electronically Signed   By: Rolm Baptise M.D.   On: 01/04/2022 21:15   ECHOCARDIOGRAM COMPLETE  Result Date: 01/04/2022    ECHOCARDIOGRAM REPORT   Patient Name:   [name] [name] Date of Exam: 01/04/2022 Medical Rec #:  [id number]        Height:       62.0 in Accession #:    [id number]       Weight:       180.1 lb Date of Birth:  1932-03-30       BSA:          1.828 m Patient Age:    25 years         BP:           123/33 mmHg Patient Gender: F                HR:           62 bpm. Exam Location:  Inpatient Procedure: 2D Echo, Cardiac Doppler and Color Doppler Indications:    Bacteremia  History:        Patient has prior history of Echocardiogram examinations, most                 recent 10/14/2021. CHF, Aortic Valve Disease,                 Signs/Symptoms:Bacteremia; Risk Factors:Diabetes, Sleep Apnea  and Dyslipidemia. Aortic stenosis.  Sonographer:    Roseanna Rainbow RDCS Referring Phys: [id number] [facility]  Sonographer Comments: Patient is obese. Image acquisition challenging due to patient body habitus. RN stated  central line is present IMPRESSIONS  1. RV is now moderately dilated with moderately reduced function. Severe pHTN is present. Aortic stenosis is moderate to severe. Central catheter seen in RA. No obvious vegetation. TEE recommended if clinically inidicated.  2. Left ventricular ejection fraction, by estimation, is 60 to 65%. The left ventricle has normal function. The left ventricle has no regional wall motion abnormalities. There is mild concentric left ventricular hypertrophy. Left ventricular diastolic parameters are consistent with Grade II diastolic dysfunction (pseudonormalization). The E/e' is 32.4. There is the interventricular septum is flattened in systole, consistent with right ventricular pressure overload.  3. Right ventricular systolic function is moderately reduced. The right ventricular size is moderately enlarged. There is severely elevated pulmonary artery systolic pressure. The estimated right ventricular systolic pressure is 16.9 mmHg.  4. Right atrial size was mild to moderately dilated.  5. The mitral valve is grossly normal. Trivial mitral valve regurgitation. No evidence of mitral stenosis.  6. Tricuspid valve regurgitation is mild to moderate.  7. The aortic valve is tricuspid. Aortic valve regurgitation is not visualized. Moderate to severe aortic valve stenosis. Aortic valve area, by VTI measures 1.02 cm. Aortic valve mean gradient measures 36.0 mmHg. Aortic valve Vmax measures 3.89 m/s.  8. The inferior vena cava is dilated in size with <50% respiratory variability, suggesting right atrial pressure of 15 mmHg. Comparison(s): Changes from prior study are noted. FINDINGS  Left Ventricle: Left ventricular ejection fraction, by estimation, is 60 to 65%. The left ventricle has normal function. The left ventricle has no regional wall motion abnormalities. The left ventricular internal cavity size was normal in size. There is  mild concentric left ventricular hypertrophy. The interventricular  septum is flattened in systole, consistent with right ventricular pressure overload. Left ventricular diastolic parameters are consistent with Grade II diastolic dysfunction (pseudonormalization). The E/e' is 32.4. Right Ventricle: The right ventricular size is moderately enlarged. No increase in right ventricular wall thickness. Right ventricular systolic function is moderately reduced. There is severely elevated pulmonary artery systolic pressure. The tricuspid regurgitant velocity is 4.57 m/s, and with an assumed right atrial pressure of 15 mmHg, the estimated right ventricular systolic pressure is 67.8 mmHg. Left Atrium: Left atrial size was normal in size. Right Atrium: Right atrial size was mild to moderately dilated. Pericardium: Trivial pericardial effusion is present. Mitral Valve: The mitral valve is grossly normal. Trivial mitral valve regurgitation. No evidence of mitral valve stenosis. MV peak gradient, 10.4 mmHg. The mean mitral valve gradient is 2.5 mmHg. Tricuspid Valve: The tricuspid valve is grossly normal. Tricuspid valve regurgitation is mild to moderate. No evidence of tricuspid stenosis. Aortic Valve: The aortic valve is tricuspid. Aortic valve regurgitation is not visualized. Moderate to severe aortic stenosis is present. Aortic valve mean gradient measures 36.0 mmHg. Aortic valve peak gradient measures 60.5 mmHg. Aortic valve area, by VTI measures 1.02 cm. Pulmonic Valve: The pulmonic valve was grossly normal. Pulmonic valve regurgitation is trivial. No evidence of pulmonic stenosis. Aorta: The aortic root and ascending aorta are structurally normal, with no evidence of dilitation. Venous: The inferior vena cava is dilated in size with less than 50% respiratory variability, suggesting right atrial pressure of 15 mmHg. IAS/Shunts: The atrial septum is grossly normal. Additional Comments: A venous catheter is visualized in the right  atrium.  LEFT VENTRICLE PLAX 2D LVIDd:         4.10 cm      Diastology LVIDs:         2.53 cm     LV e' medial:    4.50 cm/s LV PW:         1.30 cm     LV E/e' medial:  32.4 LV IVS:        1.13 cm     LV e' lateral:   3.45 cm/s LVOT diam:     2.13 cm     LV E/e' lateral: 42.3 LV SV:         95 LV SV Index:   52 LVOT Area:     3.56 cm  LV Volumes (MOD) LV vol d, MOD A2C: 55.1 ml LV vol d, MOD A4C: 50.7 ml LV vol s, MOD A2C: 23.0 ml LV vol s, MOD A4C: 16.5 ml LV SV MOD A2C:     32.1 ml LV SV MOD A4C:     50.7 ml LV SV MOD BP:      35.5 ml RIGHT VENTRICLE            IVC RV S prime:     8.03 cm/s  IVC diam: 2.30 cm TAPSE (M-mode): 1.7 cm LEFT ATRIUM             Index        RIGHT ATRIUM           Index LA diam:        4.30 cm 2.35 cm/m   RA Area:     16.90 cm LA Vol (A2C):   29.8 ml 16.30 ml/m  RA Volume:   46.70 ml  25.54 ml/m LA Vol (A4C):   52.5 ml 28.71 ml/m LA Biplane Vol: 41.8 ml 22.86 ml/m  AORTIC VALVE AV Area (Vmax):    0.99 cm AV Area (Vmean):   0.94 cm AV Area (VTI):     1.02 cm AV Vmax:           389.00 cm/s AV Vmean:          282.000 cm/s AV VTI:            0.940 m AV Peak Grad:      60.5 mmHg AV Mean Grad:      36.0 mmHg LVOT Vmax:         108.00 cm/s LVOT Vmean:        74.200 cm/s LVOT VTI:          0.268 m LVOT/AV VTI ratio: 0.29  AORTA Ao Root diam: 2.55 cm Ao Asc diam:  2.80 cm MITRAL VALVE                TRICUSPID VALVE MV Area (PHT): 4.68 cm     TR Peak grad:   83.5 mmHg MV Peak grad:  10.4 mmHg    TR Vmax:        457.00 cm/s MV Mean grad:  2.5 mmHg MV Vmax:       1.62 m/s     SHUNTS MV Vmean:      73.8 cm/s    Systemic VTI:  0.27 m MV Decel Time: 162 msec     Systemic Diam: 2.13 cm MV E velocity: 146.00 cm/s MV A velocity: 106.00 cm/s MV E/A ratio:  1.38 Eleonore Chiquito MD Electronically signed by Eleonore Chiquito MD Signature Date/Time: 01/04/2022/10:43:29 AM    Final  DG Chest Port 1 View  Result Date: 01/03/2022 CLINICAL DATA:  [id number]. Encounter for central line placement. History of hypertension. EXAM: PORTABLE CHEST 1 VIEW COMPARISON:   Chest CT without contrast yesterday at 11:23 p.m. FINDINGS: 7:40 p.m. There is cardiomegaly and mild central vascular prominence. Vascular asymmetry of the right hilum. Tortuosity and calcification of the aorta noted with otherwise unremarkable mediastinum. There is no overt pulmonary edema. There are small pleural effusions with hazy atelectasis or infiltrate in the base of both lungs. The lungs are otherwise generally clear. There is overlying monitor wiring. Newly noted right IJ central line in place, the tip in the upper right atrium. No pneumothorax. Thoracic spondylosis. IMPRESSION: 1. Right IJ central line tip in the upper right atrium. No pneumothorax. 2. Cardiomegaly with mild central vascular prominence. 3. Small pleural effusions with hazy atelectasis or infiltrate in the base of both lungs. 4. Aortic atherosclerosis. Electronically Signed   By: Telford Nab M.D.   On: 01/03/2022 20:10   [place] EKG SITE RITE  Result Date: 01/03/2022 If Site Rite image not attached, placement could not be confirmed due to current cardiac rhythm.  CT CHEST WO CONTRAST  Result Date: 01/03/2022 CLINICAL DATA:  Fall injury at home with hypoxia on presentation. Shortness of breath. EXAM: CT CHEST WITHOUT CONTRAST TECHNIQUE: Multidetector CT imaging of the chest was performed following the standard protocol without IV contrast. RADIATION DOSE REDUCTION: This exam was performed according to the departmental dose-optimization program which includes automated exposure control, adjustment of the mA and/or kV according to patient size and/or use of iterative reconstruction technique. COMPARISON:  The portable chest earlier today, chest radiograph 10/16/2021, and chest CT without contrast of 09/18/2013. FINDINGS: Cardiovascular: There is mild cardiomegaly and a small pericardial effusion both increased since 2015. The pulmonary arteries are upper-normal in caliber. The pulmonary veins are decompressed. Calcifications are noted  in the aortic valve leaflets, with moderate patchy calcification in the aortic wall, scattered calcification in the great vessels. There is no aortic aneurysm. Mediastinum/Nodes: Small hiatal hernia. No esophageal thickening is seen or tracheal filling defects. Thyroid gland both axillary spaces are unremarkable. There are mildly prominent lymph nodes in the upper left hilar region and in the precarinal space to the right, both measuring up to 1.1 cm in short axis. These were present previously. Hilar adenopathy is difficult to evaluate without contrast but no other hilar adenopathy is definitively noted. Azygoesophageal recess lymph nodes have increased in prominence now 1.3 cm in short axis, previously 8 mm. There is no other visible adenopathy without contrast. Lungs/Pleura: There are small bilateral layering pleural effusions with slightly greater fluid on the right. There is adjacent compressive atelectasis. There is bronchial thickening in both lower lobes with scattered bilateral posterior basal impacted bronchioles. There is interlobular septal thickening in both lung bases which is probably due to interstitial edema. Edema is not seen elsewhere. There is reticulated scarring at both lung apices and again noted chronic subpleural reticulation in the right middle lobe and lateral right lower lobe base. The remainder of the lung fields are clear. There is no pleural thickening or pneumothorax. Upper Abdomen: Splenomegaly again noted, mildly increased, AP diameter was previously 13.9 cm now 14.8 cm. There is bilateral renal cortical thinning. No acute findings. Musculoskeletal: There is osteopenia, degenerative changes and kyphosis of the thoracic spine. No acute or significant osseous finding is seen. No chest wall lesion. IMPRESSION: 1. Cardiomegaly with small pericardial effusion. 2. Upper-normal caliber pulmonary arteries. 3. Aortic and  coronary artery atherosclerosis. 4. Small layering pleural effusions  with adjacent compressive atelectasis. 5. Bilateral lower lobe bronchial thickening with scattered impacted bronchioles. No focal pneumonia is seen. 6. Interlobular septal thickening in the lung bases probably due to mild interstitial edema. Question mild CHF or fluid overload but no venous distention is evident. 7. Chronic changes in the right middle lobe and lateral right lower lobe base. 8. Mildly prominent mediastinal lymph nodes, increased in size from 2015. 9. Splenomegaly, mildly increased from 2015. 10. Small hiatal hernia. 11. Osteopenia, degenerative change and kyphosis of the thoracic spine. No acute or significant osseous findings. 12. Bilateral renal cortical thinning. Electronically Signed   By: Telford Nab M.D.   On: 01/03/2022 00:01   CT Head Wo Contrast  Result Date: 01/02/2022 CLINICAL DATA:  Head trauma, minor (Age >= 65y); Neck trauma (Age >= 65y) EXAM: CT HEAD WITHOUT CONTRAST CT CERVICAL SPINE WITHOUT CONTRAST TECHNIQUE: Multidetector CT imaging of the head and cervical spine was performed following the standard protocol without intravenous contrast. Multiplanar CT image reconstructions of the cervical spine were also generated. RADIATION DOSE REDUCTION: This exam was performed according to the departmental dose-optimization program which includes automated exposure control, adjustment of the mA and/or kV according to patient size and/or use of iterative reconstruction technique. COMPARISON:  CT head and cervical spine 08/02/2021 FINDINGS: CT HEAD FINDINGS Brain: No evidence of large-territorial acute infarction. No parenchymal hemorrhage. No mass lesion. No extra-axial collection. No mass effect or midline shift. No hydrocephalus. Basilar cisterns are patent. Vascular: No hyperdense vessel. Atherosclerotic calcifications are present within the cavernous internal carotid arteries. Skull: No acute fracture or focal lesion. Sinuses/Orbits: Left maxillary sinus mucosal thickening.  Otherwise paranasal sinuses and mastoid air cells are clear. Bilateral lens replacement. Otherwise the orbits are unremarkable. Other: None. CT CERVICAL SPINE FINDINGS Alignment: Stable grade 1 anterolisthesis of C5 on C6. Stable mild retrolisthesis of C3 on C4. Skull base and vertebrae: Multilevel mild degenerative changes spine . No severe osseous central canal stenosis or neural foraminal stenosis. No acute fracture. No aggressive appearing focal osseous lesion or focal pathologic process. Soft tissues and spinal canal: No prevertebral fluid or swelling. No visible canal hematoma. Upper chest: Unremarkable. Other: None. IMPRESSION: 1. No acute intracranial abnormality. 2. No acute displaced fracture or traumatic listhesis of the cervical spine. Electronically Signed   By: Iven Finn M.D.   On: 01/02/2022 23:52   CT Cervical Spine Wo Contrast  Result Date: 01/02/2022 CLINICAL DATA:  Head trauma, minor (Age >= 65y); Neck trauma (Age >= 65y) EXAM: CT HEAD WITHOUT CONTRAST CT CERVICAL SPINE WITHOUT CONTRAST TECHNIQUE: Multidetector CT imaging of the head and cervical spine was performed following the standard protocol without intravenous contrast. Multiplanar CT image reconstructions of the cervical spine were also generated. RADIATION DOSE REDUCTION: This exam was performed according to the departmental dose-optimization program which includes automated exposure control, adjustment of the mA and/or kV according to patient size and/or use of iterative reconstruction technique. COMPARISON:  CT head and cervical spine 08/02/2021 FINDINGS: CT HEAD FINDINGS Brain: No evidence of large-territorial acute infarction. No parenchymal hemorrhage. No mass lesion. No extra-axial collection. No mass effect or midline shift. No hydrocephalus. Basilar cisterns are patent. Vascular: No hyperdense vessel. Atherosclerotic calcifications are present within the cavernous internal carotid arteries. Skull: No acute fracture or  focal lesion. Sinuses/Orbits: Left maxillary sinus mucosal thickening. Otherwise paranasal sinuses and mastoid air cells are clear. Bilateral lens replacement. Otherwise the orbits are unremarkable. Other: None. CT  CERVICAL SPINE FINDINGS Alignment: Stable grade 1 anterolisthesis of C5 on C6. Stable mild retrolisthesis of C3 on C4. Skull base and vertebrae: Multilevel mild degenerative changes spine . No severe osseous central canal stenosis or neural foraminal stenosis. No acute fracture. No aggressive appearing focal osseous lesion or focal pathologic process. Soft tissues and spinal canal: No prevertebral fluid or swelling. No visible canal hematoma. Upper chest: Unremarkable. Other: None. IMPRESSION: 1. No acute intracranial abnormality. 2. No acute displaced fracture or traumatic listhesis of the cervical spine. Electronically Signed   By: Iven Finn M.D.   On: 01/02/2022 23:52   DG Chest Port 1 View  Result Date: 01/02/2022 CLINICAL DATA:  Questionable sepsis.  Evaluate abnormality. EXAM: PORTABLE CHEST 1 VIEW COMPARISON:  Chest radiograph dated October 16, 2021 FINDINGS: The heart is enlarged. Atherosclerotic calcification of the aortic arch. Biapical pleural/parenchymal scarring. Bibasilar atelectasis or infiltrate. No pleural effusion or pneumothorax. No acute osseous abnormality. IMPRESSION: 1. Bibasilar atelectasis or infiltrate. 2. Cardiomegaly. Electronically Signed   By: Keane Police D.O.   On: 01/02/2022 23:21    Microbiology: Results for orders placed or performed during the hospital encounter of 01/02/22  Blood Culture (routine x 2)     Status: Abnormal   Collection Time: 01/02/22 11:15 PM   Specimen: BLOOD  Result Value Ref Range Status   Specimen Description   Final    BLOOD RIGHT ANTECUBITAL Performed at [facility] [address] [place], [place] [address]    Special Requests   Final    BOTTLES DRAWN AEROBIC AND ANAEROBIC Blood Culture adequate  volume Performed at [facility] [address], [place], [place] [address]    Culture  Setup Time   Final    GRAM POSITIVE COCCI IN CHAINS IN BOTH AEROBIC AND ANAEROBIC BOTTLES CRITICAL RESULT CALLED TO, READ BACK BY AND VERIFIED WITH: PHARMD A PHAM [phone] AT 1340 BY CM Performed at [facility], [place] [address], [place], [place] [address]    Culture GROUP B STREP(S.AGALACTIAE)ISOLATED (A)  Final   Report Status 01/05/2022 FINAL  Final   Organism ID, Bacteria GROUP B STREP(S.AGALACTIAE)ISOLATED  Final      Susceptibility   Group b strep(s.agalactiae)isolated - MIC*    CLINDAMYCIN <=0.25 SENSITIVE Sensitive     AMPICILLIN <=0.25 SENSITIVE Sensitive     ERYTHROMYCIN <=0.12 SENSITIVE Sensitive     VANCOMYCIN 0.5 SENSITIVE Sensitive     CEFTRIAXONE <=0.12 SENSITIVE Sensitive     LEVOFLOXACIN 0.5 SENSITIVE Sensitive     PENICILLIN <=0.06 SENSITIVE Sensitive     * GROUP B STREP(S.AGALACTIAE)ISOLATED  Blood Culture ID Panel (Reflexed)     Status: Abnormal   Collection Time: 01/02/22 11:15 PM  Result Value Ref Range Status   Enterococcus faecalis NOT DETECTED NOT DETECTED Final   Enterococcus Faecium NOT DETECTED NOT DETECTED Final   Listeria monocytogenes NOT DETECTED NOT DETECTED Final   Staphylococcus species NOT DETECTED NOT DETECTED Final   Staphylococcus aureus (BCID) NOT DETECTED NOT DETECTED Final   Staphylococcus epidermidis NOT DETECTED NOT DETECTED Final   Staphylococcus lugdunensis NOT DETECTED NOT DETECTED Final   Streptococcus species DETECTED (A) NOT DETECTED Final    Comment: CRITICAL RESULT CALLED TO, READ BACK BY AND VERIFIED WITH: PHARMD A PHAM [phone] AT 1340 BY CM    Streptococcus agalactiae DETECTED (A) NOT DETECTED Final    Comment: CRITICAL RESULT CALLED TO, READ BACK BY AND VERIFIED WITH: PHARMD A PHAM [id number] AT 1340 BY CM  Streptococcus pneumoniae NOT DETECTED NOT DETECTED Final   Streptococcus pyogenes NOT DETECTED NOT DETECTED  Final   A.calcoaceticus-baumannii NOT DETECTED NOT DETECTED Final   Bacteroides fragilis NOT DETECTED NOT DETECTED Final   Enterobacterales NOT DETECTED NOT DETECTED Final   Enterobacter cloacae complex NOT DETECTED NOT DETECTED Final   Escherichia coli NOT DETECTED NOT DETECTED Final   Klebsiella aerogenes NOT DETECTED NOT DETECTED Final   Klebsiella oxytoca NOT DETECTED NOT DETECTED Final   Klebsiella pneumoniae NOT DETECTED NOT DETECTED Final   Proteus species NOT DETECTED NOT DETECTED Final   Salmonella species NOT DETECTED NOT DETECTED Final   Serratia marcescens NOT DETECTED NOT DETECTED Final   Haemophilus influenzae NOT DETECTED NOT DETECTED Final   Neisseria meningitidis NOT DETECTED NOT DETECTED Final   Pseudomonas aeruginosa NOT DETECTED NOT DETECTED Final   Stenotrophomonas maltophilia NOT DETECTED NOT DETECTED Final   Candida albicans NOT DETECTED NOT DETECTED Final   Candida auris NOT DETECTED NOT DETECTED Final   Candida glabrata NOT DETECTED NOT DETECTED Final   Candida krusei NOT DETECTED NOT DETECTED Final   Candida parapsilosis NOT DETECTED NOT DETECTED Final   Candida tropicalis NOT DETECTED NOT DETECTED Final   Cryptococcus neoformans/gattii NOT DETECTED NOT DETECTED Final    Comment: Performed at [facility], [address] [place], [place] [address]  Blood Culture (routine x 2)     Status: Abnormal   Collection Time: 01/03/22 12:26 AM   Specimen: BLOOD  Result Value Ref Range Status   Specimen Description   Final    BLOOD BLOOD RIGHT HAND Performed at [facility] [address], [place], [place] [address]    Special Requests   Final    BOTTLES DRAWN AEROBIC AND ANAEROBIC Blood Culture adequate volume Performed at [facility] [address], [place], [place] [address]    Culture  Setup Time   Final    GRAM POSITIVE COCCI IN CHAINS IN BOTH AEROBIC AND ANAEROBIC BOTTLES CRITICAL RESULT CALLED TO, READ BACK BY  AND VERIFIED WITH: PHARMD CRYSTAL ROBERTSON ON 01/03/22 @ 1557 BY DRT    Culture (A)  Final    GROUP B STREP(S.AGALACTIAE)ISOLATED SUSCEPTIBILITIES PERFORMED ON PREVIOUS CULTURE WITHIN THE LAST 5 DAYS. Performed at [facility], [place] [address], [place], [place] [address]    Report Status 01/05/2022 FINAL  Final  Resp Panel by RT-PCR (Flu A&B, Covid) Anterior Nasal Swab     Status: None   Collection Time: 01/03/22 12:31 AM   Specimen: Anterior Nasal Swab  Result Value Ref Range Status   SARS Coronavirus 2 by RT PCR NEGATIVE NEGATIVE Final    Comment: (NOTE) SARS-CoV-2 target nucleic acids are NOT DETECTED.  The SARS-CoV-2 RNA is generally detectable in upper respiratory specimens during the acute phase of infection. The lowest concentration of SARS-CoV-2 viral copies this assay can detect is 138 copies/mL. A negative result does not preclude SARS-Cov-2 infection and should not be used as the sole basis for treatment or other patient management decisions. A negative result may occur with  improper specimen collection/handling, submission of specimen other than nasopharyngeal swab, presence of viral mutation(s) within the areas targeted by this assay, and inadequate number of viral copies(<138 copies/mL). A negative result must be combined with clinical observations, patient history, and epidemiological information. The expected result is Negative.  Fact Sheet for Patients:  EntrepreneurPulse.com.au  Fact Sheet for Healthcare Providers:  IncredibleEmployment.be  This test is no t yet approved  or cleared by the [place] and  has been authorized for detection and/or diagnosis of SARS-CoV-2 by FDA under an Emergency Use Authorization (EUA). This EUA will remain  in effect (meaning this test can be used) for the duration of the COVID-19 declaration under Section 564(b)(1) of the Act, 21 U.S.C.section 360bbb-3(b)(1), unless the  authorization is terminated  or revoked sooner.       Influenza A by PCR NEGATIVE NEGATIVE Final   Influenza B by PCR NEGATIVE NEGATIVE Final    Comment: (NOTE) The Xpert Xpress SARS-CoV-2/FLU/RSV plus assay is intended as an aid in the diagnosis of influenza from Nasopharyngeal swab specimens and should not be used as a sole basis for treatment. Nasal washings and aspirates are unacceptable for Xpert Xpress SARS-CoV-2/FLU/RSV testing.  Fact Sheet for Patients: EntrepreneurPulse.com.au  Fact Sheet for Healthcare Providers: IncredibleEmployment.be  This test is not yet approved or cleared by the [place] FDA and has been authorized for detection and/or diagnosis of SARS-CoV-2 by FDA under an Emergency Use Authorization (EUA). This EUA will remain in effect (meaning this test can be used) for the duration of the COVID-19 declaration under Section 564(b)(1) of the Act, 21 U.S.C. section 360bbb-3(b)(1), unless the authorization is terminated or revoked.  Performed at [facility], [place] [address], [place], [place] [address]   MRSA Next Gen by PCR, Nasal     Status: Abnormal   Collection Time: 01/03/22  2:50 AM   Specimen: Nasal Mucosa; Nasal Swab  Result Value Ref Range Status   MRSA by PCR Next Gen DETECTED (A) NOT DETECTED Final    Comment: (NOTE) The GeneXpert MRSA Assay (FDA approved for NASAL specimens only), is one component of a comprehensive MRSA colonization surveillance program. It is not intended to diagnose MRSA infection nor to guide or monitor treatment for MRSA infections. Test performance is not FDA approved in patients less than 32 years old. Performed at [facility], [place] [address] [place], [place] [address]   Respiratory (~20 pathogens) panel by PCR     Status: None   Collection Time: 01/03/22  9:00 AM   Specimen: Nasopharyngeal Swab; Respiratory  Result Value Ref Range Status    Adenovirus NOT DETECTED NOT DETECTED Final   Coronavirus 229E NOT DETECTED NOT DETECTED Final    Comment: (NOTE) The Coronavirus on the Respiratory Panel, DOES NOT test for the novel  Coronavirus (2019 nCoV)    Coronavirus HKU1 NOT DETECTED NOT DETECTED Final   Coronavirus NL63 NOT DETECTED NOT DETECTED Final   Coronavirus OC43 NOT DETECTED NOT DETECTED Final   Metapneumovirus NOT DETECTED NOT DETECTED Final   Rhinovirus / Enterovirus NOT DETECTED NOT DETECTED Final   Influenza A NOT DETECTED NOT DETECTED Final   Influenza B NOT DETECTED NOT DETECTED Final   Parainfluenza Virus 1 NOT DETECTED NOT DETECTED Final   Parainfluenza Virus 2 NOT DETECTED NOT DETECTED Final   Parainfluenza Virus 3 NOT DETECTED NOT DETECTED Final   Parainfluenza Virus 4 NOT DETECTED NOT DETECTED Final   Respiratory Syncytial Virus NOT DETECTED NOT DETECTED Final   Bordetella pertussis NOT DETECTED NOT DETECTED Final   Bordetella Parapertussis NOT DETECTED NOT DETECTED Final   Chlamydophila pneumoniae NOT DETECTED NOT DETECTED Final   Mycoplasma pneumoniae NOT DETECTED NOT DETECTED Final    Comment: Performed at [facility], [place] [address], [place], [place] [address]  Urine Culture     Status: None   Collection Time: 01/03/22  9:48  AM   Specimen: Urine, Clean Catch  Result Value Ref Range Status   Specimen Description   Final    URINE, CLEAN CATCH Performed at [facility], [place] [address], [place], [place] [address]    Special Requests   Final    NONE Performed at [facility], [place] [address] [place], [place] [address]    Culture   Final    NO GROWTH Performed at [facility], [place] [address], [place], [place] [address]    Report Status 01/04/2022 FINAL  Final  Culture, blood (Routine X 2) w Reflex to ID Panel     Status: None   Collection Time: 01/04/22  7:36 AM   Specimen: BLOOD  Result Value Ref Range Status   Specimen Description   Final     BLOOD BLOOD LEFT ARM Performed at [facility] [address], [place], [place] [address]    Special Requests   Final    BOTTLES DRAWN AEROBIC ONLY Blood Culture adequate volume Performed at [facility] [address], [place], [place] [address]    Culture   Final    NO GROWTH 5 DAYS Performed at [facility], [place] [address] [place], [place] [address]    Report Status 01/09/2022 FINAL  Final  Culture, blood (Routine X 2) w Reflex to ID Panel     Status: None   Collection Time: 01/04/22  7:46 AM   Specimen: BLOOD  Result Value Ref Range Status   Specimen Description   Final    BLOOD BLOOD RIGHT HAND Performed at [facility] [address], [place], [place] [address]    Special Requests   Final    BOTTLES DRAWN AEROBIC ONLY Blood Culture adequate volume Performed at [facility] [address], [place], [place] [address]    Culture   Final    NO GROWTH 5 DAYS Performed at [facility], [place] [address] [place], [place] [address]    Report Status 01/09/2022 FINAL  Final    Labs: CBC: Recent Labs  Lab 01/02/22 2315 01/02/22 2344 01/03/22 0519 01/03/22 1206 01/04/22 0542 01/05/22 0245 01/07/22 1213  WBC 13.3*  --  13.8* 12.1* 13.6* 7.4 6.2  NEUTROABS 12.0*  --  12.2*  --   --   --   --   HGB 9.2*   < > 8.0* 8.1* 8.7* 8.0* 8.2*  HCT 32.3*   < > 27.7* 28.1* 30.1* 26.5* 28.7*  MCV 89.5  --  88.2 88.4 87.0 86.0 88.3  PLT 330  --  290 246 292 266 317   < > = values in this interval not displayed.   Basic Metabolic Panel: Recent Labs  Lab 01/02/22 2315 01/02/22 2344 01/03/22 0519 01/04/22 0542 01/05/22 0245 01/06/22 0113 01/07/22 1213 01/09/22 0401  NA 136   < > 138 136 138 137 138 145  K 4.9   < > 4.1 4.2 4.0 5.0 4.4 4.5  CL 110   < > 111 109 110 109 106 117*  CO2 16*  --  18* 21* 20* 21* 23 21*  GLUCOSE 307*   < > 197* 144* 164* 128* 248* 133*  BUN 60*   < > 73* 75*  79* 82* 71* 58*  CREATININE 2.72*   < > 2.90* 3.04* 3.06* 2.87* 2.73* 2.17*  CALCIUM 8.7*  --  8.8* 8.9 8.6* 8.8* 8.9 9.7  MG 1.9  --  2.0 2.4  --   --   --   --   PHOS  --   --  4.0 5.2*  --   --   --   --    < > = values in this interval not displayed.   Liver Function Tests: Recent Labs  Lab 01/02/22 2315 01/03/22 0519 01/07/22 1213  AST 32 22 12*  ALT 20 21 <5  ALKPHOS 56 50 53  BILITOT 0.8 1.1 0.3  PROT 6.4* 5.8* 6.4*  ALBUMIN 3.7 3.4* 3.3*   CBG: Recent Labs  Lab 01/08/22 0740 01/08/22 1652 01/08/22 2125 01/09/22 0754 01/09/22 1135  GLUCAP 144* 177* 165* 127* 225*    Discharge time spent: approximately 35 minutes spent on discharge counseling, evaluation of patient on day of discharge, and coordination of discharge planning with nursing, social work, pharmacy and case management  Signed: Edwin Dada, MD [facility] 01/09/2022       Patient was discharged 01/09/22 when hospital staff were told facility could and would accept.  At the time of discharge, facility refused to accept patient, then due to holiday yesterday pharmacy was not open and facility could not accept again.  No clinical change in interim.  General: Pt is alert, awake, not in acute distress, sitting up in recliner Cardiovascular: RRR, nl S1-S2, loud sys murmur.   No LE edema.  Tunneled line appears clean. Respiratory: Normal respiratory rate and rhythm.  CTAB without rales or wheezes. Neuro/Psych: Strength symmetric in upper and lower extremities.  Judgment and insight appear normal.    See med list above.  Signed: Edwin Dada, MD [facility] 01/11/2022

## 2022-01-10 DIAGNOSIS — A419 Sepsis, unspecified organism: Secondary | ICD-10-CM | POA: Diagnosis not present

## 2022-01-10 DIAGNOSIS — R6520 Severe sepsis without septic shock: Secondary | ICD-10-CM | POA: Diagnosis not present

## 2022-01-10 LAB — BASIC METABOLIC PANEL
Anion gap: 3 — ABNORMAL LOW (ref 5–15)
BUN: 52 mg/dL — ABNORMAL HIGH (ref 8–23)
CO2: 21 mmol/L — ABNORMAL LOW (ref 22–32)
Calcium: 9.8 mg/dL (ref 8.9–10.3)
Chloride: 119 mmol/L — ABNORMAL HIGH (ref 98–111)
Creatinine, Ser: 2.15 mg/dL — ABNORMAL HIGH (ref 0.44–1.00)
GFR, Estimated: 22 mL/min — ABNORMAL LOW (ref >60)
Glucose, Bld: 145 mg/dL — ABNORMAL HIGH (ref 70–99)
Potassium: 4.4 mmol/L (ref 3.5–5.1)
Sodium: 143 mmol/L (ref 135–145)

## 2022-01-10 LAB — CBC
HCT: 29.9 % — ABNORMAL LOW (ref 36.0–46.0)
Hemoglobin: 8.1 g/dL — ABNORMAL LOW (ref 12.0–15.0)
MCH: 24.6 pg — ABNORMAL LOW (ref 26.0–34.0)
MCHC: 27.1 g/dL — ABNORMAL LOW (ref 30.0–36.0)
MCV: 90.9 fL (ref 80.0–100.0)
Platelets: 321 10*3/uL (ref 150–400)
RBC: 3.29 MIL/uL — ABNORMAL LOW (ref 3.87–5.11)
RDW: 22.4 % — ABNORMAL HIGH (ref 11.5–15.5)
WBC: 6.1 10*3/uL (ref 4.0–10.5)
nRBC: 0 % (ref 0.0–0.2)

## 2022-01-10 LAB — GLUCOSE, CAPILLARY
Glucose-Capillary: 135 mg/dL — ABNORMAL HIGH (ref 70–99)
Glucose-Capillary: 151 mg/dL — ABNORMAL HIGH (ref 70–99)
Glucose-Capillary: 221 mg/dL — ABNORMAL HIGH (ref 70–99)
Glucose-Capillary: 157 mg/dL — ABNORMAL HIGH (ref 70–99)

## 2022-01-10 MED ORDER — LIP MEDEX EX OINT
TOPICAL_OINTMENT | CUTANEOUS | Status: DC | PRN
  Filled 2022-01-10 (×2): qty 7

## 2022-01-10 NOTE — Progress Notes (Signed)
  Progress Note   Patient: [name] [name] WIO:[id number] DOB: 1932/11/01 DOA: 01/02/2022     7 DOS: the patient was seen and examined on 01/10/2022 at 9:45AM      Brief hospital course: 88 yo F with CKD IV, DM, obesity, seizures, and dCHF who presented with sepsis, found to have GBS bacteremia, ID recommending empiric treatment for IE.    Assessment and Plan: Septic shock due to GBS bacteremia - Continue Rocephin   AKI on CKD IV Resolved to baseline, foley removed, voiding well.  Chronic diastolic CHF Stable, euvolemic - Continue midodrine  Type 2 diabetes - Continue SS corrections - Continue Levemir  History seizures - Continue Keppra  Hypothyroidism  - Continue LT4   Normocytic anemia Hgb stable, no bleeding.   Severe pulmonary hypertension - Outpaitent cardiology follow up   History of JAK2 positive myeloproliferative disorder  RLS - Continue Requip  Obesity, BMI 31      Subjective: No complaints, bleeding from tunneled cath has stopped.  No fever, no respiraotyr symptoms, no cough.     Physical Exam: BP (!) 143/51 (BP Location: Left Arm)   Pulse 87   Temp 98.2 F (36.8 C) (Oral)   Resp 16   Ht '5\' 2"'$  (1.575 m)   Wt 81.1 kg   SpO2 92%   BMI 32.70 kg/m   Obese adult female, lying in bed, no acute distress RRR, no change to loud systolic murmur, no peripheral edema Respiratory rate normal, lungs clear without rales or wheezes Abdomen soft without tenderness palpation or guarding, no ascites or distention Attention normal, affect appropriate, judgment insight appear normal  Data Reviewed: Creatinine 2.1, no change Hemoglobin 8.1, no change  Family Communication:      Disposition: Status is: Inpatient Patient was discharged yesterday when her nursing home stated she was ready for discharge, then they did not accept report and today claim that they do not have pharmacy staff to accept patient; she remains medically stable for  discharge to rehab whenever bed is available        Author: Edwin Dada, MD 01/10/2022 4:04 PM  For on call review [email or web address].

## 2022-01-10 NOTE — Progress Notes (Addendum)
Physical Therapy Treatment Patient Details Name: [name] [name] MRN: [id number] DOB: 01-24-1933 Today's Date: 01/10/2022   History of Present Illness [name] [name] is a 88 y.o. female who is admitted to [facility] on 01/02/2022 with severe sepsis secondary GBS bacteremia suspected endocarditis vs cellulitis. Pt with medical history significant for type 2 diabetes mellitus, anemia of chronic disease , acquired hypothyroidism, stage IV CKD, seizures, moderate aortic stenosis, chronic diastolic heart failure,    PT Comments    Pt making gradual progress. Requiring min cues and min guard for safety.  Pt fatigues easily but gradually improving.  Continue plan of care - pt was expected to d/c yesterday but does not have meds at facility yet.     Recommendations for follow up therapy are one component of a multi-disciplinary discharge planning process, led by the attending physician.  Recommendations may be updated based on patient status, additional functional criteria and insurance authorization.  Follow Up Recommendations  Skilled nursing-short term rehab (<3 hours/day) Can patient physically be transported by private vehicle: Yes   Assistance Recommended at Discharge Intermittent Supervision/Assistance  Patient can return home with the following A little help with walking and/or transfers;A little help with bathing/dressing/bathroom;Assistance with cooking/housework;Assist for transportation;Help with stairs or ramp for entrance   Equipment Recommendations  None recommended by PT    Recommendations for Other Services       Precautions / Restrictions Precautions Precautions: Fall Precaution Comments: fell PTA, monitor O2     Mobility  Bed Mobility               General bed mobility comments: in chair    Transfers Overall transfer level: Needs assistance Equipment used: Rolling walker (2 wheels) Transfers: Sit to/from Stand Sit to Stand: Min guard            General transfer comment: VCs hand placement; performed x 2    Ambulation/Gait Ambulation/Gait assistance: Min guard Gait Distance (Feet): 100 Feet Assistive device: Rolling walker (2 wheels) Gait Pattern/deviations: Step-to pattern, Decreased stride length Gait velocity: decreased     General Gait Details: Steady with RW; fatigued end of walk; was on 2 L but pulse ox not working during Passenger transport manager [place] (Stroke Patients Only)       Balance Overall balance assessment: History of Falls, Needs assistance Sitting-balance support: Feet supported, No upper extremity supported Sitting balance-Leahy Scale: Good     Standing balance support: Bilateral upper extremity supported, Reliant on assistive device for balance, No upper extremity supported Standing balance-Leahy Scale: Fair Standing balance comment: RW to ambulate but stood to don and doff back gown without UE support                            Cognition Arousal/Alertness: Awake/alert Behavior During Therapy: WFL for tasks assessed/performed Overall Cognitive Status: Within Functional Limits for tasks assessed                                          Exercises      General Comments General comments (skin integrity, edema, etc.): Pulse ox not working with ambulation.  Reports fatigue post ambulation - further exercise held      Pertinent Vitals/Pain Pain Assessment Pain Assessment:  No/denies pain    Home Living                          Prior Function            PT Goals (current goals can now be found in the care plan section) Progress towards PT goals: Progressing toward goals    Frequency    Min 3X/week      PT Plan Current plan remains appropriate    Co-evaluation              AM-PAC PT "6 Clicks" Mobility   Outcome Measure  Help needed turning from your back to your side while in  a flat bed without using bedrails?: A Little Help needed moving from lying on your back to sitting on the side of a flat bed without using bedrails?: A Little Help needed moving to and from a bed to a chair (including a wheelchair)?: A Little Help needed standing up from a chair using your arms (e.g., wheelchair or bedside chair)?: A Little Help needed to walk in hospital room?: A Little Help needed climbing 3-5 steps with a railing? : A Lot 6 Click Score: 17    End of Session Equipment Utilized During Treatment: Oxygen;Gait belt Activity Tolerance: Patient limited by fatigue Patient left: in chair;with call bell/phone within reach (in chair at arrival, no alarm, follows commands) Nurse Communication: Mobility status PT Visit Diagnosis: Difficulty in walking, not elsewhere classified (R26.2);History of falling (Z91.81)     Time: 8288-3374 PT Time Calculation (min) (ACUTE ONLY): 20 min  Charges:  $Gait Training: 8-22 mins                     Abran Richard, PT Acute Rehab [facility] 01/10/2022, 2:28 PM

## 2022-01-10 NOTE — Progress Notes (Signed)
Pt continues to refuse CPAP qhs.

## 2022-01-11 DIAGNOSIS — A419 Sepsis, unspecified organism: Secondary | ICD-10-CM | POA: Diagnosis not present

## 2022-01-11 DIAGNOSIS — R6520 Severe sepsis without septic shock: Secondary | ICD-10-CM | POA: Diagnosis not present

## 2022-01-11 LAB — GLUCOSE, CAPILLARY
Glucose-Capillary: 132 mg/dL — ABNORMAL HIGH (ref 70–99)
Glucose-Capillary: 204 mg/dL — ABNORMAL HIGH (ref 70–99)

## 2022-01-11 MED ORDER — HEPARIN SOD (PORK) LOCK FLUSH 100 UNIT/ML IV SOLN
250.0000 [IU] | INTRAVENOUS | Status: AC | PRN
  Administered 2022-01-11: 250 [IU]
  Filled 2022-01-11: qty 2.5

## 2022-01-11 NOTE — TOC Progression Note (Addendum)
Transition of Care [facility]) - Progression Note    Patient Details  Name: [name] [name] MRN: [id number] Date of Birth: 1932/07/19  Transition of Care [facility] CM/SW Contact  Leeroy Cha, RN Phone Number: 01/11/2022, 11:13 AM  Clinical Narrative:    Tct-[facility]-patient can come today have meds lined up.  MD notified. Ptar called for transport at 1132 . Papers for transport at the nurses station.   Expected Discharge Plan: [place] Barriers to Discharge: Continued Medical Work up  Expected Discharge Plan and Services Expected Discharge Plan: [place] In-house Referral: NA Discharge Planning Services: CM Consult   Living arrangements for the past 2 months: [place] Expected Discharge Date: 01/09/22               DME Arranged: N/A DME Agency: NA       HH Arranged: NA HH Agency: NA         Social Determinants of Health (SDOH) Interventions    Readmission Risk Interventions   No data to display

## 2022-01-11 NOTE — Progress Notes (Signed)
AVS given to ptar and explained over the phone with the pt's receiving nurse at [facility]. Medications and follow up appointments have been explained with pt's nurse verbalizing understanding.

## 2022-01-11 NOTE — Progress Notes (Signed)
  Progress Note   Patient: [name] [name] TYO:[id number] DOB: [birth year] DOA: 01/02/2022     8 DOS: the patient was seen and examined on 01/11/2022        Brief hospital course: See discharge summary.     Assessment and Plan: GBS bacteremia - Continue Rocephin    Hypothyroidism - Continue levothyroxine  Seizures - Continue Keppra  Chronic hypotension - Continue midodrine               Physical Exam: BP (!) 135/93 (BP Location: Left Arm)   Pulse 85   Temp 98.1 F (36.7 C) (Oral)   Resp 16   Ht '5\' 2"'$  (1.575 m)   Wt 82.5 kg   SpO2 91%   BMI 33.27 kg/m   See discharge summary addendum.          Author: Edwin Dada, MD 01/11/2022 11:28 AM  For on call review [email or web address].

## 2022-01-14 ENCOUNTER — Ambulatory Visit: Payer: Medicare Other | Admitting: Orthopedic Surgery

## 2022-01-23 ENCOUNTER — Ambulatory Visit: Payer: Medicare Other | Admitting: Infectious Diseases

## 2022-01-23 ENCOUNTER — Other Ambulatory Visit: Payer: Self-pay

## 2022-01-23 ENCOUNTER — Encounter: Payer: Self-pay | Admitting: Infectious Diseases

## 2022-01-23 ENCOUNTER — Telehealth: Payer: Self-pay

## 2022-01-23 VITALS — BP 143/77 | HR 100 | Temp 97.4°F

## 2022-01-23 DIAGNOSIS — Z452 Encounter for adjustment and management of vascular access device: Secondary | ICD-10-CM | POA: Diagnosis not present

## 2022-01-23 DIAGNOSIS — B951 Streptococcus, group B, as the cause of diseases classified elsewhere: Secondary | ICD-10-CM

## 2022-01-23 DIAGNOSIS — I330 Acute and subacute infective endocarditis: Secondary | ICD-10-CM | POA: Insufficient documentation

## 2022-01-23 DIAGNOSIS — R7881 Bacteremia: Secondary | ICD-10-CM

## 2022-01-23 DIAGNOSIS — Z79899 Other long term (current) drug therapy: Secondary | ICD-10-CM | POA: Diagnosis not present

## 2022-01-23 NOTE — Telephone Encounter (Addendum)
Per Dr. [place] Bali pull tunnel picc after last dose on 12/29. Spoke to nurse Otila Kluver [facility] [phone] or nurse supervisor [phone] CNA present to relay verbal orders and fax over labs, sent written orders as well. Patient is scheduled for removal on 12/29 @ 2pm with IR, patient and facility aware of time and date.

## 2022-01-23 NOTE — Patient Instructions (Signed)
End date of IV ceftriaxone is 02/15/22 Tunneled line to be removed after last dose on 02/15/22 Fu in 3 weeks

## 2022-01-23 NOTE — Progress Notes (Signed)
Patient Active Problem List   Diagnosis Date Noted   Severe sepsis ([place]) 01/03/2022   HCAP (healthcare-associated pneumonia) 01/03/2022   Generalized weakness 01/03/2022   Fall at home, initial encounter 01/03/2022   History of seizures 01/03/2022   Allergic rhinitis 01/03/2022   Aortic stenosis 01/03/2022   Chronic diastolic CHF (congestive heart failure) ([place]) 01/03/2022   Bacteremia due to group B Streptococcus 10/18/2021   Acute respiratory failure with hypoxia ([place]) 10/14/2021   Benign neoplasm of cecum    Benign neoplasm of rectum    Blood in stool    Melena    Acute blood loss anemia    GI bleed 04/04/2020   Anemia of chronic disease 11/02/2019   DM neuropathy, type II diabetes mellitus (HCC)    Stage 4 chronic kidney disease (HCC)    Obesity (BMI 30-39.9)    High cholesterol    Aortic valve thickening by Cardiac echo in 2015 11/01/2019   Iron deficiency anemia 05/10/2019   Secondary hyperparathyroidism of renal origin ([place]) 09/20/2018   DM2 (diabetes mellitus, type 2) ([place]) 12/08/2017   ACE inhibitor intolerance 12/08/2017   Vertigo of central origin 11/20/2015   Morbid obesity ([place]) BMI 35+ with T2DM, htn, hyperlipidemia, CKD, OSA 12/12/2014   OSA on CPAP 08/08/2014   Type II diabetes mellitus with neurological manifestations ([place]) 09/15/2013   Hyperlipidemia associated with type 2 diabetes mellitus ([place]) 06/27/2013   Vitamin D deficiency 06/27/2013   Seizure disorder ([place]) 11/04/2012   Epilepsy ([place]) 11/04/2012   Anemia 06/13/2008   COLONIC POLYPS 06/09/2008   Acquired hypothyroidism 06/09/2008   CKD stage 4 due to type 2 diabetes mellitus ([place]) 06/09/2008   Venous (peripheral) insufficiency 06/09/2008   Gastroesophageal reflux disease 06/09/2008   Essential hypertension 06/09/2008    Patient's Medications  New Prescriptions   No medications on file  Previous Medications   ACETAMINOPHEN (TYLENOL) 500 MG TABLET    Take 1,000 mg by mouth every 6 (six)  hours as needed for moderate pain.   ALLOPURINOL (ZYLOPRIM) 100 MG TABLET    Take 1 tablet (100 mg total) by mouth 2 (two) times daily. Take  1 tablet  Daily  to prevent Gout.   ASPIRIN 81 MG TABLET    Take 81 mg by mouth daily.   BD VEO INSULIN SYRINGE U/F 31G X 15/64" 0.3 ML MISC    2 (two) times daily.   BLOOD GLUCOSE MONITORING SUPPL (ONETOUCH VERIO) W/DEVICE KIT    Check  Blood Sugar   4 x /day before Meals & Bedtime   (( Dx-  e11.22 ))   CEFTRIAXONE (ROCEPHIN) IVPB    Inject 2 g into the vein daily. Indication:  GBS bacteremia - empiric IE treatment  First Dose: Yes Last Day of Therapy:  02/15/2022 Labs - Once weekly:  CBC/D and BMP, Labs - Every other week:  ESR and CRP Method of administration: IV Push Refer to interventional radiology for tunneled catheter removal at the completion of IV therapy  Method of administration may be changed at the discretion of home infusion pharmacist based upon assessment of the patient and/or caregiver's ability to self-administer the medication ordered.   CHOLECALCIFEROL (VITAMIN D3) 25 MCG (1000 UNIT) TABLET    Take 3,000 Units by mouth daily.   FISH OIL-OMEGA-3 FATTY ACIDS 1000 MG CAPSULE    Take 1,000 mg by mouth at bedtime.   GLUCOSE BLOOD (ONETOUCH VERIO) TEST STRIP    Check Blood Sugar 4 x /day  ((  Dx-  e11.22 ))   HYDROCORTISONE (ANUSOL-HC) 2.5 % RECTAL CREAM    Place rectally 2 (two) times daily.   INSULIN NPH-REGULAR HUMAN (HUMULIN 70/30) (70-30) 100 UNIT/ML INJECTION    Inject 8 Units into the skin daily with breakfast.   LANCETS (ONETOUCH ULTRASOFT) LANCETS    Check Blood Sugar  4 x /day  before Meals & Bedtime   (( Dx-  e11.22 ))   LANSOPRAZOLE (PREVACID) 30 MG CAPSULE    TAKE 1 CAPSULE BY MOUTH DAILY TO PREVENT HEARTBURN AND INDIGESTION.   LEVETIRACETAM (KEPPRA) 500 MG TABLET    Take  1 tablet 2  x /day  with Meals  to Prevent Seizures   LEVOTHYROXINE (SYNTHROID) 100 MCG TABLET    TAKE 1 TABLET BY MOUTH DAILY ON AN EMPTY STOMACH WITH ONLY  WATER FOR 30 MINUTES. NO ANTACIDS, CALCIUM OR MAGNESIUM FOR 4 HOURS. AVOID BIOTIN   LORATADINE (CLARITIN) 10 MG TABLET    Take 10 mg by mouth daily.   MECLIZINE (ANTIVERT) 25 MG TABLET    Take   1 tablet 2 to 3 x /day  ONLY  if needed for Dizziness /Vertigo   MIDODRINE (PROAMATINE) 10 MG TABLET    Take 1 tablet (10 mg total) by mouth 3 (three) times daily with meals.   ROPINIROLE (REQUIP) 1 MG TABLET    TAKE ONE-HALF TO ONE TABLET BY MOUTH THREE TIMES DAILY AS NEEDED FOR RESTLESS LEGS& CRAMPS   VITAMIN B-12 (CYANOCOBALAMIN) 100 MCG TABLET    Take 500 mcg by mouth daily.   VITAMIN C (ASCORBIC ACID) 500 MG TABLET    Take 500 mg by mouth daily.  Modified Medications   No medications on file  Discontinued Medications   No medications on file    Subjective: 88 year old female with PMH as below including DM 2, acquired hypothyroidism, CKD, moderate aortic stenosis, seizures, chronic diastolic CHF, Myeloproliferative disorder who is here for HFU for recurrent strep B bacteremia.  Hospital course complicated by hypotension requiring admission to ICU and vasopressors as well as group B strep strep bacteremia with unclear source with some concerns about cellulitis initially. TTE 11/17  negative for vegetation.  Patient was downgraded from ICU on 11/18 after improvement of hypotension with midodrine support.Was discharged on 01/09/22. Patient is getting IV ceftriaxone from Rt chest tunneled catheter. Denies missing doses or concerns related to the abtx. Denies nausea, vomiting, abdominal pain and diarrhea. She is accompanied by staff from the facility. Complains of swelling in her bilateral lower legs since stopping lasix. Discussed to fu with provider at her facility regarding possible need to resume lasix. Denies any pain/redness in the bilateral lower legs. I reviewed MAR from the facility and confirmed she is getting IV ceftriaxone 2 g iv daily. No acute concerns today.  Review of Systems: all systems  reviewed with pertinent positives and negatives as listed above  Past Medical History:  Diagnosis Date   Anemia, unspecified    Arthritis    "knees; right shoulder" (09/15/2013)   Basal cell carcinoma    "right upper outer lip"   DM neuropathy, type II diabetes mellitus ([place])    Dyspnea 2021   with low iron   GERD (gastroesophageal reflux disease)    Gout    High cholesterol    Hypertension    Hypothyroidism    Meniere's disease    Obesity (BMI 30-39.9)    Other forms of epilepsy and recurrent seizures without mention of intractable epilepsy 11/04/2012   Non  convulsive paroxysmal spells, responding to [facility], patient not driving.    Pneumonia ~ 1943   Skin cancer    "forehead; right hand"   Stage 4 chronic kidney disease ([place])    UTI (urinary tract infection)    Vitamin D deficiency    Past Surgical History:  Procedure Laterality Date   BIOPSY  04/05/2020   Procedure: BIOPSY;  Surgeon: Jerene Bears, MD;  Location: [facility];  Service: Gastroenterology;;   CERVICAL POLYPECTOMY     COLONOSCOPY WITH PROPOFOL N/A 04/06/2020   Procedure: COLONOSCOPY WITH PROPOFOL;  Surgeon: Jerene Bears, MD;  Location: [facility];  Service: Gastroenterology;  Laterality: N/A;   ESOPHAGOGASTRODUODENOSCOPY (EGD) WITH PROPOFOL N/A 04/05/2020   Procedure: ESOPHAGOGASTRODUODENOSCOPY (EGD) WITH PROPOFOL;  Surgeon: Jerene Bears, MD;  Location: [facility];  Service: Gastroenterology;  Laterality: N/A;   HAMMER TOE SURGERY Left 1980's   IR FLUORO GUIDE CV LINE RIGHT  01/09/2022   IR PERC TUN PERIT CATH WO PORT S&I /IMAG  01/09/2022   IR US GUIDE VASC ACCESS RIGHT  01/09/2022   MOHS SURGERY  20087   "right upper outer lip"   POLYPECTOMY  04/06/2020   Procedure: POLYPECTOMY;  Surgeon: Jerene Bears, MD;  Location: [facility];  Service: Gastroenterology;;   TONSILLECTOMY  ~ 1947   WRIST SURGERY Left ~ 1950   "ran arm thru window"    Social History   Tobacco Use   Smoking status: Former     Packs/day: 1.00    Years: 30.00    Total pack years: 30.00    Types: Cigarettes    Quit date: 06/29/1983    Years since quitting: 38.5   Smokeless tobacco: Never  Vaping Use   Vaping Use: Never used  Substance Use Topics   Alcohol use: Yes    Alcohol/week: 0.0 standard drinks of alcohol    Comment: 09/15/2013 "glass of wine maybe once/year"   Drug use: No    Family History  Problem Relation Age of Onset   Heart disease Mother    Stroke Mother    Heart disease Father    Ovarian cancer Sister    Hypertension Son    Hypertension Son    Diabetes Son    Alcohol abuse Son     Allergies  Allergen Reactions   Ppd [Tuberculin Purified Protein Derivative] Other (See Comments)    indurated    Health Maintenance  Topic Date Due   Medicare Annual Wellness (AWV)  Never done   Zoster Vaccines- Shingrix (1 of 2) Never done   INFLUENZA VACCINE  09/18/2021   COVID-19 Vaccine (4 - 2023-24 season) 10/19/2021   OPHTHALMOLOGY EXAM  01/22/2022   HEMOGLOBIN A1C  05/06/2022   FOOT EXAM  11/06/2022   DTaP/Tdap/Td (3 - Tdap) 12/09/2027   Pneumonia Vaccine 67+ Years old  Completed   DEXA SCAN  Completed   HPV VACCINES  Aged Out    Objective: BP (!) 143/77   Pulse 100   Temp (!) 97.4 F (36.3 C) (Oral)   SpO2 99%    Physical Exam Constitutional:      Appearance: Normal appearance. Sitting up in the wheel chair HENT:     Head: Normocephalic and atraumatic.      Mouth: Mucous membranes are moist.  Eyes:    Conjunctiva/sclera: Conjunctivae normal.     Pupils: Pupils are equal, round, and bilaterally symmetrical    Cardiovascular:     Rate and Rhythm: Normal rate and regular rhythm.  Heart sounds:   Pulmonary:     Effort: Pulmonary effort is normal on nasal cannula     Breath sounds: Normal breath sounds.   Abdominal:     General: Non distended     Palpations: soft.   Musculoskeletal:        General: Normal range of motion. Bilateral 1+ pedal edema with no signs of  cellulitis  Skin:    General: Skin is warm and dry.     Comments: Rt chest tunneled catheter, no redness/warmth and tenderness  Neurological:     General: in wheel chair    Mental Status: awake, alert and oriented to person, place, and time.   Psychiatric:        Mood and Affect: Mood normal.   Lab Results Lab Results  Component Value Date   WBC 6.1 01/10/2022   HGB 8.1 (L) 01/10/2022   HCT 29.9 (L) 01/10/2022   MCV 90.9 01/10/2022   PLT 321 01/10/2022    Lab Results  Component Value Date   CREATININE 2.15 (H) 01/10/2022   BUN 52 (H) 01/10/2022   NA 143 01/10/2022   K 4.4 01/10/2022   CL 119 (H) 01/10/2022   CO2 21 (L) 01/10/2022    Lab Results  Component Value Date   ALT <5 01/07/2022   AST 12 (L) 01/07/2022   ALKPHOS 53 01/07/2022   BILITOT 0.3 01/07/2022    Lab Results  Component Value Date   CHOL 112 11/05/2021   HDL 27 (L) 11/05/2021   LDLCALC 63 11/05/2021   TRIG 141 11/05/2021   CHOLHDL 4.1 11/05/2021   No results found for: "LABRPR", "RPRTITER" No results found for: "HIV1RNAQUANT", "HIV1RNAVL", "CD4TABS"   Assessment/Plan  # Recurrent group B streptococcus bacteremia of unknown clear origin/Presumptive endocarditis  - 11/17 blood culture no growth to date  - TTE 01/04/22 no concerns for vegetation or endocarditis  - TEE cancelled today due to risks far outweigh benefit and plan to treat for for endocarditis presumtively  Complete 6 weeks of IV ceftriaxone 02/15/22 Fu in 3 weeks  # Medication Monitoring  - requested labs from SNF, not available today   # RT chest tunneled cathter - no acute concerns - order placed to be removed after last dose on 02/15/22. IR consult placed   I have personally spent 45 minutes involved in face-to-face and non-face-to-face activities for this patient on the day of the visit. Professional time spent includes the following activities: Preparing to see the patient (review of tests), Obtaining and/or reviewing  separately obtained history (admission/discharge record), Performing a medically appropriate examination and/or evaluation , Ordering medications/tests/procedures, referring and communicating with other health care professionals, Documenting clinical information in the EMR, Independently interpreting results (not separately reported), Communicating results to the patient/family/caregiver, Counseling and educating the patient/family/caregiver and Care coordination (not separately reported).   Wilber Oliphant, [place] [facility] 01/23/2022, 2:04 PM

## 2022-01-24 ENCOUNTER — Inpatient Hospital Stay: Payer: Medicare Other | Admitting: Infectious Diseases

## 2022-01-25 ENCOUNTER — Telehealth: Payer: Self-pay

## 2022-01-25 NOTE — Telephone Encounter (Signed)
LM-01/25/22-Chart review started. Reviewing OV, Consults, Hospital visits, Labs and medication changes.  Chart review complete. Called pt. To complete a General Review Call. Unable to reach pt. [place] X1.  Total time spent: 16 min.

## 2022-01-29 ENCOUNTER — Other Ambulatory Visit: Payer: Self-pay

## 2022-01-29 ENCOUNTER — Inpatient Hospital Stay: Payer: Medicare Other | Admitting: Hematology

## 2022-01-29 ENCOUNTER — Inpatient Hospital Stay: Payer: Medicare Other | Attending: Hematology

## 2022-01-29 VITALS — BP 134/70 | HR 95 | Temp 97.8°F | Resp 17 | Ht 62.0 in

## 2022-01-29 DIAGNOSIS — C946 Myelodysplastic disease, not classified: Secondary | ICD-10-CM | POA: Diagnosis present

## 2022-01-29 DIAGNOSIS — D471 Chronic myeloproliferative disease: Secondary | ICD-10-CM | POA: Diagnosis not present

## 2022-01-29 DIAGNOSIS — D649 Anemia, unspecified: Secondary | ICD-10-CM | POA: Insufficient documentation

## 2022-01-29 LAB — CMP (CANCER CENTER ONLY)
ALT: 7 U/L (ref 0–44)
AST: 12 U/L — ABNORMAL LOW (ref 15–41)
Albumin: 4.1 g/dL (ref 3.5–5.0)
Alkaline Phosphatase: 68 U/L (ref 38–126)
Anion gap: 5 (ref 5–15)
BUN: 25 mg/dL — ABNORMAL HIGH (ref 8–23)
CO2: 22 mmol/L (ref 22–32)
Calcium: 10.8 mg/dL — ABNORMAL HIGH (ref 8.9–10.3)
Chloride: 113 mmol/L — ABNORMAL HIGH (ref 98–111)
Creatinine: 1.75 mg/dL — ABNORMAL HIGH (ref 0.44–1.00)
GFR, Estimated: 28 mL/min — ABNORMAL LOW (ref >60)
Glucose, Bld: 190 mg/dL — ABNORMAL HIGH (ref 70–99)
Potassium: 4.6 mmol/L (ref 3.5–5.1)
Sodium: 140 mmol/L (ref 135–145)
Total Bilirubin: 0.3 mg/dL (ref 0.3–1.2)
Total Protein: 6.3 g/dL — ABNORMAL LOW (ref 6.5–8.1)

## 2022-01-29 LAB — CBC WITH DIFFERENTIAL (CANCER CENTER ONLY)
Abs Immature Granulocytes: 0.05 10*3/uL (ref 0.00–0.07)
Basophils Absolute: 0 10*3/uL (ref 0.0–0.1)
Basophils Relative: 1 %
Eosinophils Absolute: 0.8 10*3/uL — ABNORMAL HIGH (ref 0.0–0.5)
Eosinophils Relative: 12 %
HCT: 33.9 % — ABNORMAL LOW (ref 36.0–46.0)
Hemoglobin: 9.6 g/dL — ABNORMAL LOW (ref 12.0–15.0)
Immature Granulocytes: 1 %
Lymphocytes Relative: 8 %
Lymphs Abs: 0.5 10*3/uL — ABNORMAL LOW (ref 0.7–4.0)
MCH: 24.4 pg — ABNORMAL LOW (ref 26.0–34.0)
MCHC: 28.3 g/dL — ABNORMAL LOW (ref 30.0–36.0)
MCV: 86.3 fL (ref 80.0–100.0)
Monocytes Absolute: 0.4 10*3/uL (ref 0.1–1.0)
Monocytes Relative: 7 %
Neutro Abs: 4.8 10*3/uL (ref 1.7–7.7)
Neutrophils Relative %: 71 %
Platelet Count: 307 10*3/uL (ref 150–400)
RBC: 3.93 MIL/uL (ref 3.87–5.11)
RDW: 21.7 % — ABNORMAL HIGH (ref 11.5–15.5)
WBC Count: 6.6 10*3/uL (ref 4.0–10.5)
nRBC: 0 % (ref 0.0–0.2)

## 2022-01-29 LAB — IRON AND IRON BINDING CAPACITY (CC-WL,HP ONLY)
Iron: 11 ug/dL — ABNORMAL LOW (ref 28–170)
Saturation Ratios: 3 % — ABNORMAL LOW (ref 10.4–31.8)
TIBC: 349 ug/dL (ref 250–450)
UIBC: 338 ug/dL (ref 148–442)

## 2022-01-29 LAB — FERRITIN: Ferritin: 19 ng/mL (ref 11–307)

## 2022-01-29 NOTE — Progress Notes (Signed)
HEMATOLOGY/ONCOLOGY CLINIC NOTE  Date of Service: 01/29/2022   Patient Care Team: Unk Pinto, MD as PCP - General (Internal Medicine) Rozetta Nunnery, MD (Inactive) as Consulting Physician (Otolaryngology) Teena Irani, MD (Inactive) as Consulting Physician (Gastroenterology) Marcy Panning, MD as Consulting Physician (Oncology) Claudia Desanctis, MD as Consulting Physician (Internal Medicine) Rush Landmark, [facility] (Inactive) as Pharmacist (Pharmacist)  CHIEF COMPLAINTS/PURPOSE OF CONSULTATION:  Follow-up for JAK2 positive myeloproliferative neoplasm  HISTORY OF PRESENTING ILLNESS:  Please see previous note for details on initial presentation  INTERVAL HISTORY:   [name] [name] is a 88 y.o. female here for follow-up of her JAK2 positive myeloproliferative neoplasm. Patient is here with a nurse.   Patient was last seen by me on 07/04/2021 and patient complained of recurrent vertigo and dizziness.   She has been hospitalized twice since our last visit, for Streptococcus sepsis, and pneumonia. She is currently on 6-week regimen of antibiotics during this visit.  Patient denies fever, chills, night sweats, back pain, or abdominal pain. Patient complains of shortness of breath and bilateral leg swelling. She is also now using oxygen tank, 2L, during this visit. Patient notes her breathing is not getting better.   Patient notes she is currently living in [facility], but independently.     MEDICAL HISTORY:  Past Medical History:  Diagnosis Date   Anemia, unspecified    Arthritis    "knees; right shoulder" (09/15/2013)   Basal cell carcinoma    "right upper outer lip"   DM neuropathy, type II diabetes mellitus ([place])    Dyspnea 2021   with low iron   GERD (gastroesophageal reflux disease)    Gout    High cholesterol    Hypertension    Hypothyroidism    Meniere's disease    Obesity (BMI 30-39.9)    Other forms of epilepsy and recurrent seizures without  mention of intractable epilepsy 11/04/2012   Non convulsive paroxysmal spells, responding to [facility], patient not driving.    Pneumonia ~ 1943   Skin cancer    "forehead; right hand"   Stage 4 chronic kidney disease ([place])    UTI (urinary tract infection)    Vitamin D deficiency     SURGICAL HISTORY: Past Surgical History:  Procedure Laterality Date   BIOPSY  04/05/2020   Procedure: BIOPSY;  Surgeon: Jerene Bears, MD;  Location: [facility];  Service: Gastroenterology;;   CERVICAL POLYPECTOMY     COLONOSCOPY WITH PROPOFOL N/A 04/06/2020   Procedure: COLONOSCOPY WITH PROPOFOL;  Surgeon: Jerene Bears, MD;  Location: [facility];  Service: Gastroenterology;  Laterality: N/A;   ESOPHAGOGASTRODUODENOSCOPY (EGD) WITH PROPOFOL N/A 04/05/2020   Procedure: ESOPHAGOGASTRODUODENOSCOPY (EGD) WITH PROPOFOL;  Surgeon: Jerene Bears, MD;  Location: [facility];  Service: Gastroenterology;  Laterality: N/A;   HAMMER TOE SURGERY Left 1980's   IR FLUORO GUIDE CV LINE RIGHT  01/09/2022   IR PERC TUN PERIT CATH WO PORT S&I /IMAG  01/09/2022   IR US GUIDE VASC ACCESS RIGHT  01/09/2022   MOHS SURGERY  20087   "right upper outer lip"   POLYPECTOMY  04/06/2020   Procedure: POLYPECTOMY;  Surgeon: Jerene Bears, MD;  Location: [facility];  Service: Gastroenterology;;   TONSILLECTOMY  ~ [place] Left ~ 1950   "ran arm thru window"    SOCIAL HISTORY: Social History   Socioeconomic History   Marital status: Widowed    Spouse name: Not on file   Number of  children: 3   Years of education: 17   Highest education level: Not on file  Occupational History   Occupation: retired  Tobacco Use   Smoking status: Former    Packs/day: 1.00    Years: 30.00    Total pack years: 30.00    Types: Cigarettes    Quit date: 06/29/1983    Years since quitting: 38.6   Smokeless tobacco: Never  Vaping Use   Vaping Use: Never used  Substance and Sexual Activity   Alcohol use: Yes    Alcohol/week: 0.0  standard drinks of alcohol    Comment: 09/15/2013 "glass of wine maybe once/year"   Drug use: No   Sexual activity: Not Currently  Other Topics Concern   Not on file  Social History Narrative   Patient lives at home alone and she is widowed.   Retired.   Education some college.   Right handed.   Caffeine sometimes not daily.    Social Determinants of Health   Financial Resource Strain: Not on file  Food Insecurity: No Food Insecurity (01/03/2022)   Hunger Vital Sign    Worried About Running Out of Food in the Last Year: Never true    Ran Out of Food in the Last Year: Never true  Transportation Needs: No Transportation Needs (01/03/2022)   PRAPARE - Hydrologist (Medical): No    Lack of Transportation (Non-Medical): No  Physical Activity: Not on file  Stress: Not on file  Social Connections: Not on file  Intimate Partner Violence: Not At Risk (01/03/2022)   Humiliation, Afraid, Rape, and Kick questionnaire    Fear of Current or Ex-Partner: No    Emotionally Abused: No    Physically Abused: No    Sexually Abused: No    FAMILY HISTORY: Family History  Problem Relation Age of Onset   Heart disease Mother    Stroke Mother    Heart disease Father    Ovarian cancer Sister    Hypertension Son    Hypertension Son    Diabetes Son    Alcohol abuse Son     ALLERGIES:  is allergic to ppd [tuberculin purified protein derivative].  MEDICATIONS:  Current Outpatient Medications  Medication Sig Dispense Refill   acetaminophen (TYLENOL) 500 MG tablet Take 1,000 mg by mouth every 6 (six) hours as needed for moderate pain.     allopurinol (ZYLOPRIM) 100 MG tablet Take 1 tablet (100 mg total) by mouth 2 (two) times daily. Take  1 tablet  Daily  to prevent Gout. 180 tablet 2   aspirin 81 MG tablet Take 81 mg by mouth daily.     BD VEO INSULIN SYRINGE U/F 31G X 15/64" 0.3 ML MISC 2 (two) times daily.     Blood Glucose Monitoring Suppl (ONETOUCH VERIO)  w/Device KIT Check  Blood Sugar   4 x /day before Meals & Bedtime   (( Dx-  e11.22 )) 1 kit 1   cefTRIAXone (ROCEPHIN) IVPB Inject 2 g into the vein daily. Indication:  GBS bacteremia - empiric IE treatment  First Dose: Yes Last Day of Therapy:  02/15/2022 Labs - Once weekly:  CBC/D and BMP, Labs - Every other week:  ESR and CRP Method of administration: IV Push Refer to interventional radiology for tunneled catheter removal at the completion of IV therapy  Method of administration may be changed at the discretion of home infusion pharmacist based upon assessment of the patient and/or caregiver's ability to self-administer  the medication ordered. 38 Units 0   cholecalciferol (VITAMIN D3) 25 MCG (1000 UNIT) tablet Take 3,000 Units by mouth daily.     fish oil-omega-3 fatty acids 1000 MG capsule Take 1,000 mg by mouth at bedtime.     glucose blood (ONETOUCH VERIO) test strip Check Blood Sugar 4 x /day  (( Dx-  e11.22 )) 300 each 3   hydrocortisone (ANUSOL-HC) 2.5 % rectal cream Place rectally 2 (two) times daily. 30 g 0   insulin NPH-regular Human (HUMULIN 70/30) (70-30) 100 UNIT/ML injection Inject 8 Units into the skin daily with breakfast. 30 mL 3   Lancets (ONETOUCH ULTRASOFT) lancets Check Blood Sugar  4 x /day  before Meals & Bedtime   (( Dx-  e11.22 )) 400 each 3   lansoprazole (PREVACID) 30 MG capsule TAKE 1 CAPSULE BY MOUTH DAILY TO PREVENT HEARTBURN AND INDIGESTION. (Patient taking differently: Take 30 mg by mouth daily.) 90 capsule 3   levETIRAcetam (KEPPRA) 500 MG tablet Take  1 tablet 2  x /day  with Meals  to Prevent Seizures (Patient taking differently: 250 mg in the morning and 543m in the evening) 180 tablet 3   levothyroxine (SYNTHROID) 100 MCG tablet TAKE 1 TABLET BY MOUTH DAILY ON AN EMPTY STOMACH WITH ONLY WATER FOR 30 MINUTES. NO ANTACIDS, CALCIUM OR MAGNESIUM FOR 4 HOURS. AVOID BIOTIN (Patient taking differently: Take 100 mcg by mouth daily before breakfast.) 90 tablet 3    loratadine (CLARITIN) 10 MG tablet Take 10 mg by mouth daily.     meclizine (ANTIVERT) 25 MG tablet Take   1 tablet 2 to 3 x /day  ONLY  if needed for Dizziness /Vertigo 90 tablet 1   midodrine (PROAMATINE) 10 MG tablet Take 1 tablet (10 mg total) by mouth 3 (three) times daily with meals.     rOPINIRole (REQUIP) 1 MG tablet TAKE ONE-HALF TO ONE TABLET BY MOUTH THREE TIMES DAILY AS NEEDED FOR RESTLESS LEGS& CRAMPS 270 tablet 3   vitamin B-12 (CYANOCOBALAMIN) 100 MCG tablet Take 500 mcg by mouth daily.     vitamin C (ASCORBIC ACID) 500 MG tablet Take 500 mg by mouth daily.     No current facility-administered medications for this visit.    REVIEW OF SYSTEMS:   .10 Point review of Systems was done is negative except as noted above.  PHYSICAL EXAMINATION: ECOG PERFORMANCE STATUS: 2 - Symptomatic, <50% confined to bed  Vitals:   01/29/22 1153  BP: 134/70  Pulse: 95  Resp: 17  Temp: 97.8 F (36.6 C)  SpO2: 99%     Filed Weights     .Body mass index is 33.27 kg/m.  NAD GENERAL:alert, in no acute distress and comfortable SKIN: no acute rashes, no significant lesions EYES: conjunctiva are pink and non-injected, sclera anicteric NECK: supple, no JVD LYMPH:  no palpable lymphadenopathy in the cervical, axillary or inguinal regions LUNGS: clear to auscultation b/l with normal respiratory effort HEART: regular rate & rhythm ABDOMEN:  normoactive bowel sounds , non tender, not distended. No palpable hepato-splenomegaly. Extremity: no pedal edema PSYCH: alert & oriented x 3 with fluent speech NEURO: no focal motor/sensory deficits  LABORATORY DATA:  I have reviewed the data as listed  .    Latest Ref Rng & Units 01/29/2022   11:07 AM 01/10/2022    5:10 AM 01/07/2022   12:13 PM  CBC  WBC 4.0 - 10.5 K/uL 6.6  6.1  6.2   Hemoglobin 12.0 - 15.0 g/dL 9.6  8.1  8.2   Hematocrit 36.0 - 46.0 % 33.9  29.9  28.7   Platelets 150 - 400 K/uL 307  321  317    . CBC    Component  Value Date/Time   WBC 6.1 01/10/2022 0510   RBC 3.29 (L) 01/10/2022 0510   HGB 8.1 (L) 01/10/2022 0510   HGB 11.5 (L) 07/04/2021 0931   HGB 11.5 (L) 10/05/2012 1111   HCT 29.9 (L) 01/10/2022 0510   HCT 34.2 (L) 10/05/2012 1111   PLT 321 01/10/2022 0510   PLT 350 07/04/2021 0931   PLT 258 10/05/2012 1111   MCV 90.9 01/10/2022 0510   MCV 90.1 10/05/2012 1111   MCH 24.6 (L) 01/10/2022 0510   MCHC 27.1 (L) 01/10/2022 0510   RDW 22.4 (H) 01/10/2022 0510   RDW 14.2 10/05/2012 1111   LYMPHSABS 0.3 (L) 01/03/2022 0519   LYMPHSABS 1.4 10/05/2012 1111   MONOABS 0.8 01/03/2022 0519   MONOABS 0.5 10/05/2012 1111   EOSABS 0.1 01/03/2022 0519   EOSABS 0.6 (H) 10/05/2012 1111   BASOSABS 0.0 01/03/2022 0519   BASOSABS 0.0 10/05/2012 1111    .    Latest Ref Rng & Units 01/29/2022   11:07 AM 01/10/2022    5:10 AM 01/09/2022    4:01 AM  CMP  Glucose 70 - 99 mg/dL 190  145  133   BUN 8 - 23 mg/dL 25  52  58   Creatinine 0.44 - 1.00 mg/dL 1.75  2.15  2.17   Sodium 135 - 145 mmol/L 140  143  145   Potassium 3.5 - 5.1 mmol/L 4.6  4.4  4.5   Chloride 98 - 111 mmol/L 113  119  117   CO2 22 - 32 mmol/L _0 Calcium 8.9 - 10.3 mg/dL 10.8  9.8  9.7   Total Protein 6.5 - 8.1 g/dL 6.3     Total Bilirubin 0.3 - 1.2 mg/dL 0.3     Alkaline Phos 38 - 126 U/L 68     AST 15 - 41 U/L 12     ALT 0 - 44 U/L 7      . Lab Results  Component Value Date   IRON 11 (L) 01/29/2022   TIBC 349 01/29/2022   IRONPCTSAT 3 (L) 01/29/2022   (Iron and TIBC)  Lab Results  Component Value Date   FERRITIN 19 01/29/2022   11/26/2019   08/10/2019 Iliac Crest Bone Marrow Report [id number]):   08/10/2019 Flow Pathology [id number]):   08/10/2019 Cytogenetics:    RADIOGRAPHIC STUDIES: I have personally reviewed the radiological images as listed and agreed with the findings in the report. IR US Guide Vasc Access Right  Result Date: 01/18/2022 INDICATION: 88 year old with bacteremia and  needs long-term IV antibiotics. EXAM: FLUOROSCOPIC AND ULTRASOUND GUIDED PLACEMENT OF A TUNNELED CENTRAL VENOUS CATHETER Physician: Stephan Minister. Anselm Pancoast, MD FLUOROSCOPY TIME:  Radiation Exposure Index (as provided by the fluoroscopic device): 3 mGy Kerma MEDICATIONS: 1% lidocaine for local anesthetic ANESTHESIA/SEDATION: None PROCEDURE: The procedure was explained to the patient. The risks and benefits of the procedure were discussed and the patient's questions were addressed. Informed consent was obtained from the patient. The patient was placed supine on the interventional table. Ultrasound confirmed a patent right internal jugularvein. Ultrasound images were obtained for documentation. The right neck and chest was prepped and draped in a sterile fashion. The right neck was anesthetized with 1% lidocaine. Maximal barrier sterile technique was utilized  including caps, mask, sterile gowns, sterile gloves, sterile drape, hand hygiene and skin antiseptic. A small incision was made with #11 blade scalpel. A 21 gauge needle directed into the right internal jugular vein with ultrasound guidance. A micropuncture dilator set was placed. A single Powerline catheter was selected. The skin below the right clavicle was anesthetized and a small incision was made with an #11 blade scalpel. A subcutaneous tunnel was formed to the vein dermatotomy site. The catheter was brought through the tunnel. The vein dermatotomy site was dilated to accommodate a peel-away sheath. The catheter was placed through the peel-away sheath and directed into the central venous structures. The tip of the catheter was placed at the superior cavoatrial junction with fluoroscopy. Fluoroscopic images were obtained for documentation. Lumen aspirated and flushed well. Lumen was flushed with heparinized saline. The vein dermatotomy site was closed using Dermabond. The catheter was secured to the skin using Prolene suture. FINDINGS: Catheter tip at the superior  cavoatrial junction. Catheter length = 23 cm COMPLICATIONS: None IMPRESSION: Successful placement of a right jugular tunneled central venous catheter using ultrasound and fluoroscopic guidance. Electronically Signed   By: Markus Daft M.D.   On: 01/18/2022 09:26   IR Fluoro Guide CV Line Right  Result Date: 01/11/2022 INDICATION: 88 year old with bacteremia and needs long-term IV antibiotics. EXAM: FLUOROSCOPIC AND ULTRASOUND GUIDED PLACEMENT OF A TUNNELED CENTRAL VENOUS CATHETER Physician: Stephan Minister. Anselm Pancoast, MD FLUOROSCOPY TIME:  Radiation Exposure Index (as provided by the fluoroscopic device): 3 mGy Kerma MEDICATIONS: 1% lidocaine for local anesthetic ANESTHESIA/SEDATION: None PROCEDURE: The procedure was explained to the patient. The risks and benefits of the procedure were discussed and the patient's questions were addressed. Informed consent was obtained from the patient. The patient was placed supine on the interventional table. Ultrasound confirmed a patent right internal jugularvein. Ultrasound images were obtained for documentation. The right neck and chest was prepped and draped in a sterile fashion. The right neck was anesthetized with 1% lidocaine. Maximal barrier sterile technique was utilized including caps, mask, sterile gowns, sterile gloves, sterile drape, hand hygiene and skin antiseptic. A small incision was made with #11 blade scalpel. A 21 gauge needle directed into the right internal jugular vein with ultrasound guidance. A micropuncture dilator set was placed. A single Powerline catheter was selected. The skin below the right clavicle was anesthetized and a small incision was made with an #11 blade scalpel. A subcutaneous tunnel was formed to the vein dermatotomy site. The catheter was brought through the tunnel. The vein dermatotomy site was dilated to accommodate a peel-away sheath. The catheter was placed through the peel-away sheath and directed into the central venous structures. The tip  of the catheter was placed at the superior cavoatrial junction with fluoroscopy. Fluoroscopic images were obtained for documentation. Lumen aspirated and flushed well. Lumen was flushed with heparinized saline. The vein dermatotomy site was closed using Dermabond. The catheter was secured to the skin using Prolene suture. FINDINGS: Catheter tip at the superior cavoatrial junction. Catheter length = 23 cm COMPLICATIONS: None IMPRESSION: Successful placement of a right jugular tunneled central venous catheter using ultrasound and fluoroscopic guidance. Electronically Signed   By: Markus Daft M.D.   On: 01/11/2022 17:45   IR TUNNELED CENTRAL VENOUS CATHETER PLACEMENT  Result Date: 01/11/2022 INDICATION: 88 year old with bacteremia and needs long-term IV antibiotics. EXAM: FLUOROSCOPIC AND ULTRASOUND GUIDED PLACEMENT OF A TUNNELED CENTRAL VENOUS CATHETER Physician: Stephan Minister. Anselm Pancoast, MD FLUOROSCOPY TIME:  Radiation Exposure Index (as provided by the  fluoroscopic device): 3 mGy Kerma MEDICATIONS: 1% lidocaine for local anesthetic ANESTHESIA/SEDATION: None PROCEDURE: The procedure was explained to the patient. The risks and benefits of the procedure were discussed and the patient's questions were addressed. Informed consent was obtained from the patient. The patient was placed supine on the interventional table. Ultrasound confirmed a patent right internal jugularvein. Ultrasound images were obtained for documentation. The right neck and chest was prepped and draped in a sterile fashion. The right neck was anesthetized with 1% lidocaine. Maximal barrier sterile technique was utilized including caps, mask, sterile gowns, sterile gloves, sterile drape, hand hygiene and skin antiseptic. A small incision was made with #11 blade scalpel. A 21 gauge needle directed into the right internal jugular vein with ultrasound guidance. A micropuncture dilator set was placed. A single Powerline catheter was selected. The skin below the  right clavicle was anesthetized and a small incision was made with an #11 blade scalpel. A subcutaneous tunnel was formed to the vein dermatotomy site. The catheter was brought through the tunnel. The vein dermatotomy site was dilated to accommodate a peel-away sheath. The catheter was placed through the peel-away sheath and directed into the central venous structures. The tip of the catheter was placed at the superior cavoatrial junction with fluoroscopy. Fluoroscopic images were obtained for documentation. Lumen aspirated and flushed well. Lumen was flushed with heparinized saline. The vein dermatotomy site was closed using Dermabond. The catheter was secured to the skin using Prolene suture. FINDINGS: Catheter tip at the superior cavoatrial junction. Catheter length = 23 cm COMPLICATIONS: None IMPRESSION: Successful placement of a right jugular tunneled central venous catheter using ultrasound and fluoroscopic guidance. Electronically Signed   By: Markus Daft M.D.   On: 01/11/2022 17:45   [place] EKG SITE RITE  Result Date: 01/08/2022 If Site Rite image not attached, placement could not be confirmed due to current cardiac rhythm.  CT ABDOMEN PELVIS WO CONTRAST  Result Date: 01/04/2022 CLINICAL DATA:  Left upper quadrant pain EXAM: CT ABDOMEN AND PELVIS WITHOUT CONTRAST TECHNIQUE: Multidetector CT imaging of the abdomen and pelvis was performed following the standard protocol without IV contrast. RADIATION DOSE REDUCTION: This exam was performed according to the departmental dose-optimization program which includes automated exposure control, adjustment of the mA and/or kV according to patient size and/or use of iterative reconstruction technique. COMPARISON:  None Available. FINDINGS: Lower chest: Small to moderate bilateral pleural effusions. Compressive atelectasis in the lower lobes. Small hiatal hernia. Hepatobiliary: Gallbladder is contracted. There appears to be gallbladder wall thickening. No visible  ductal dilatation or focal hepatic abnormality. Pancreas: No focal abnormality or ductal dilatation. Spleen: Splenomegaly with a craniocaudal length of 17 cm. Adrenals/Urinary Tract: Bilateral cortical thinning. No stones or hydronephrosis. Adrenal glands unremarkable. Urinary bladder decompressed with Foley catheter in place. Stomach/Bowel: Moderate stool burden. Stomach, large and small bowel grossly unremarkable. Vascular/Lymphatic: Aortic atherosclerosis. No evidence of aneurysm or adenopathy. Reproductive: Uterus and adnexa unremarkable.  No mass. Other: No free fluid or free air. Musculoskeletal: No acute bony abnormality. IMPRESSION: Small to moderate bilateral pleural effusions. Compressive atelectasis in the lower lobes. Small hiatal hernia. Splenomegaly. Gallbladder is contracted. There appears to be gallbladder wall thickening. This could be further evaluated with right upper quadrant ultrasound if felt clinically indicated. Aortic atherosclerosis. Electronically Signed   By: Rolm Baptise M.D.   On: 01/04/2022 21:15   ECHOCARDIOGRAM COMPLETE  Result Date: 01/04/2022    ECHOCARDIOGRAM REPORT   Patient Name:   [name] [name] Date of Exam:  01/04/2022 Medical Rec #:  [id number]        Height:       62.0 in Accession #:    [id number]       Weight:       180.1 lb Date of Birth:  Feb 14, 1933       BSA:          1.828 m Patient Age:    9 years         BP:           123/33 mmHg Patient Gender: F                HR:           62 bpm. Exam Location:  Inpatient Procedure: 2D Echo, Cardiac Doppler and Color Doppler Indications:    Bacteremia  History:        Patient has prior history of Echocardiogram examinations, most                 recent 10/14/2021. CHF, Aortic Valve Disease,                 Signs/Symptoms:Bacteremia; Risk Factors:Diabetes, Sleep Apnea                 and Dyslipidemia. Aortic stenosis.  Sonographer:    Roseanna Rainbow RDCS Referring Phys: [id number] [facility]  Sonographer Comments: Patient is  obese. Image acquisition challenging due to patient body habitus. RN stated central line is present IMPRESSIONS  1. RV is now moderately dilated with moderately reduced function. Severe pHTN is present. Aortic stenosis is moderate to severe. Central catheter seen in RA. No obvious vegetation. TEE recommended if clinically inidicated.  2. Left ventricular ejection fraction, by estimation, is 60 to 65%. The left ventricle has normal function. The left ventricle has no regional wall motion abnormalities. There is mild concentric left ventricular hypertrophy. Left ventricular diastolic parameters are consistent with Grade II diastolic dysfunction (pseudonormalization). The E/e' is 32.4. There is the interventricular septum is flattened in systole, consistent with right ventricular pressure overload.  3. Right ventricular systolic function is moderately reduced. The right ventricular size is moderately enlarged. There is severely elevated pulmonary artery systolic pressure. The estimated right ventricular systolic pressure is 57.2 mmHg.  4. Right atrial size was mild to moderately dilated.  5. The mitral valve is grossly normal. Trivial mitral valve regurgitation. No evidence of mitral stenosis.  6. Tricuspid valve regurgitation is mild to moderate.  7. The aortic valve is tricuspid. Aortic valve regurgitation is not visualized. Moderate to severe aortic valve stenosis. Aortic valve area, by VTI measures 1.02 cm. Aortic valve mean gradient measures 36.0 mmHg. Aortic valve Vmax measures 3.89 m/s.  8. The inferior vena cava is dilated in size with <50% respiratory variability, suggesting right atrial pressure of 15 mmHg. Comparison(s): Changes from prior study are noted. FINDINGS  Left Ventricle: Left ventricular ejection fraction, by estimation, is 60 to 65%. The left ventricle has normal function. The left ventricle has no regional wall motion abnormalities. The left ventricular internal cavity size was normal in size.  There is  mild concentric left ventricular hypertrophy. The interventricular septum is flattened in systole, consistent with right ventricular pressure overload. Left ventricular diastolic parameters are consistent with Grade II diastolic dysfunction (pseudonormalization). The E/e' is 32.4. Right Ventricle: The right ventricular size is moderately enlarged. No increase in right ventricular wall thickness. Right ventricular systolic function is moderately reduced. There is severely elevated pulmonary artery  systolic pressure. The tricuspid regurgitant velocity is 4.57 m/s, and with an assumed right atrial pressure of 15 mmHg, the estimated right ventricular systolic pressure is 19.6 mmHg. Left Atrium: Left atrial size was normal in size. Right Atrium: Right atrial size was mild to moderately dilated. Pericardium: Trivial pericardial effusion is present. Mitral Valve: The mitral valve is grossly normal. Trivial mitral valve regurgitation. No evidence of mitral valve stenosis. MV peak gradient, 10.4 mmHg. The mean mitral valve gradient is 2.5 mmHg. Tricuspid Valve: The tricuspid valve is grossly normal. Tricuspid valve regurgitation is mild to moderate. No evidence of tricuspid stenosis. Aortic Valve: The aortic valve is tricuspid. Aortic valve regurgitation is not visualized. Moderate to severe aortic stenosis is present. Aortic valve mean gradient measures 36.0 mmHg. Aortic valve peak gradient measures 60.5 mmHg. Aortic valve area, by VTI measures 1.02 cm. Pulmonic Valve: The pulmonic valve was grossly normal. Pulmonic valve regurgitation is trivial. No evidence of pulmonic stenosis. Aorta: The aortic root and ascending aorta are structurally normal, with no evidence of dilitation. Venous: The inferior vena cava is dilated in size with less than 50% respiratory variability, suggesting right atrial pressure of 15 mmHg. IAS/Shunts: The atrial septum is grossly normal. Additional Comments: A venous catheter is  visualized in the right atrium.  LEFT VENTRICLE PLAX 2D LVIDd:         4.10 cm     Diastology LVIDs:         2.53 cm     LV e' medial:    4.50 cm/s LV PW:         1.30 cm     LV E/e' medial:  32.4 LV IVS:        1.13 cm     LV e' lateral:   3.45 cm/s LVOT diam:     2.13 cm     LV E/e' lateral: 42.3 LV SV:         95 LV SV Index:   52 LVOT Area:     3.56 cm  LV Volumes (MOD) LV vol d, MOD A2C: 55.1 ml LV vol d, MOD A4C: 50.7 ml LV vol s, MOD A2C: 23.0 ml LV vol s, MOD A4C: 16.5 ml LV SV MOD A2C:     32.1 ml LV SV MOD A4C:     50.7 ml LV SV MOD BP:      35.5 ml RIGHT VENTRICLE            IVC RV S prime:     8.03 cm/s  IVC diam: 2.30 cm TAPSE (M-mode): 1.7 cm LEFT ATRIUM             Index        RIGHT ATRIUM           Index LA diam:        4.30 cm 2.35 cm/m   RA Area:     16.90 cm LA Vol (A2C):   29.8 ml 16.30 ml/m  RA Volume:   46.70 ml  25.54 ml/m LA Vol (A4C):   52.5 ml 28.71 ml/m LA Biplane Vol: 41.8 ml 22.86 ml/m  AORTIC VALVE AV Area (Vmax):    0.99 cm AV Area (Vmean):   0.94 cm AV Area (VTI):     1.02 cm AV Vmax:           389.00 cm/s AV Vmean:          282.000 cm/s AV VTI:  0.940 m AV Peak Grad:      60.5 mmHg AV Mean Grad:      36.0 mmHg LVOT Vmax:         108.00 cm/s LVOT Vmean:        74.200 cm/s LVOT VTI:          0.268 m LVOT/AV VTI ratio: 0.29  AORTA Ao Root diam: 2.55 cm Ao Asc diam:  2.80 cm MITRAL VALVE                TRICUSPID VALVE MV Area (PHT): 4.68 cm     TR Peak grad:   83.5 mmHg MV Peak grad:  10.4 mmHg    TR Vmax:        457.00 cm/s MV Mean grad:  2.5 mmHg MV Vmax:       1.62 m/s     SHUNTS MV Vmean:      73.8 cm/s    Systemic VTI:  0.27 m MV Decel Time: 162 msec     Systemic Diam: 2.13 cm MV E velocity: 146.00 cm/s MV A velocity: 106.00 cm/s MV E/A ratio:  1.38 Eleonore Chiquito MD Electronically signed by Eleonore Chiquito MD Signature Date/Time: 01/04/2022/10:43:29 AM    Final    DG Chest Port 1 View  Result Date: 01/03/2022 CLINICAL DATA:  [id number]. Encounter for central  line placement. History of hypertension. EXAM: PORTABLE CHEST 1 VIEW COMPARISON:  Chest CT without contrast yesterday at 11:23 p.m. FINDINGS: 7:40 p.m. There is cardiomegaly and mild central vascular prominence. Vascular asymmetry of the right hilum. Tortuosity and calcification of the aorta noted with otherwise unremarkable mediastinum. There is no overt pulmonary edema. There are small pleural effusions with hazy atelectasis or infiltrate in the base of both lungs. The lungs are otherwise generally clear. There is overlying monitor wiring. Newly noted right IJ central line in place, the tip in the upper right atrium. No pneumothorax. Thoracic spondylosis. IMPRESSION: 1. Right IJ central line tip in the upper right atrium. No pneumothorax. 2. Cardiomegaly with mild central vascular prominence. 3. Small pleural effusions with hazy atelectasis or infiltrate in the base of both lungs. 4. Aortic atherosclerosis. Electronically Signed   By: Telford Nab M.D.   On: 01/03/2022 20:10   [place] EKG SITE RITE  Result Date: 01/03/2022 If Site Rite image not attached, placement could not be confirmed due to current cardiac rhythm.  CT CHEST WO CONTRAST  Result Date: 01/03/2022 CLINICAL DATA:  Fall injury at home with hypoxia on presentation. Shortness of breath. EXAM: CT CHEST WITHOUT CONTRAST TECHNIQUE: Multidetector CT imaging of the chest was performed following the standard protocol without IV contrast. RADIATION DOSE REDUCTION: This exam was performed according to the departmental dose-optimization program which includes automated exposure control, adjustment of the mA and/or kV according to patient size and/or use of iterative reconstruction technique. COMPARISON:  The portable chest earlier today, chest radiograph 10/16/2021, and chest CT without contrast of 09/18/2013. FINDINGS: Cardiovascular: There is mild cardiomegaly and a small pericardial effusion both increased since 2015. The pulmonary arteries are  upper-normal in caliber. The pulmonary veins are decompressed. Calcifications are noted in the aortic valve leaflets, with moderate patchy calcification in the aortic wall, scattered calcification in the great vessels. There is no aortic aneurysm. Mediastinum/Nodes: Small hiatal hernia. No esophageal thickening is seen or tracheal filling defects. Thyroid gland both axillary spaces are unremarkable. There are mildly prominent lymph nodes in the upper left hilar region and in the precarinal space to  the right, both measuring up to 1.1 cm in short axis. These were present previously. Hilar adenopathy is difficult to evaluate without contrast but no other hilar adenopathy is definitively noted. Azygoesophageal recess lymph nodes have increased in prominence now 1.3 cm in short axis, previously 8 mm. There is no other visible adenopathy without contrast. Lungs/Pleura: There are small bilateral layering pleural effusions with slightly greater fluid on the right. There is adjacent compressive atelectasis. There is bronchial thickening in both lower lobes with scattered bilateral posterior basal impacted bronchioles. There is interlobular septal thickening in both lung bases which is probably due to interstitial edema. Edema is not seen elsewhere. There is reticulated scarring at both lung apices and again noted chronic subpleural reticulation in the right middle lobe and lateral right lower lobe base. The remainder of the lung fields are clear. There is no pleural thickening or pneumothorax. Upper Abdomen: Splenomegaly again noted, mildly increased, AP diameter was previously 13.9 cm now 14.8 cm. There is bilateral renal cortical thinning. No acute findings. Musculoskeletal: There is osteopenia, degenerative changes and kyphosis of the thoracic spine. No acute or significant osseous finding is seen. No chest wall lesion. IMPRESSION: 1. Cardiomegaly with small pericardial effusion. 2. Upper-normal caliber pulmonary  arteries. 3. Aortic and coronary artery atherosclerosis. 4. Small layering pleural effusions with adjacent compressive atelectasis. 5. Bilateral lower lobe bronchial thickening with scattered impacted bronchioles. No focal pneumonia is seen. 6. Interlobular septal thickening in the lung bases probably due to mild interstitial edema. Question mild CHF or fluid overload but no venous distention is evident. 7. Chronic changes in the right middle lobe and lateral right lower lobe base. 8. Mildly prominent mediastinal lymph nodes, increased in size from 2015. 9. Splenomegaly, mildly increased from 2015. 10. Small hiatal hernia. 11. Osteopenia, degenerative change and kyphosis of the thoracic spine. No acute or significant osseous findings. 12. Bilateral renal cortical thinning. Electronically Signed   By: Telford Nab M.D.   On: 01/03/2022 00:01   CT Head Wo Contrast  Result Date: 01/02/2022 CLINICAL DATA:  Head trauma, minor (Age >= 65y); Neck trauma (Age >= 65y) EXAM: CT HEAD WITHOUT CONTRAST CT CERVICAL SPINE WITHOUT CONTRAST TECHNIQUE: Multidetector CT imaging of the head and cervical spine was performed following the standard protocol without intravenous contrast. Multiplanar CT image reconstructions of the cervical spine were also generated. RADIATION DOSE REDUCTION: This exam was performed according to the departmental dose-optimization program which includes automated exposure control, adjustment of the mA and/or kV according to patient size and/or use of iterative reconstruction technique. COMPARISON:  CT head and cervical spine 08/02/2021 FINDINGS: CT HEAD FINDINGS Brain: No evidence of large-territorial acute infarction. No parenchymal hemorrhage. No mass lesion. No extra-axial collection. No mass effect or midline shift. No hydrocephalus. Basilar cisterns are patent. Vascular: No hyperdense vessel. Atherosclerotic calcifications are present within the cavernous internal carotid arteries. Skull: No  acute fracture or focal lesion. Sinuses/Orbits: Left maxillary sinus mucosal thickening. Otherwise paranasal sinuses and mastoid air cells are clear. Bilateral lens replacement. Otherwise the orbits are unremarkable. Other: None. CT CERVICAL SPINE FINDINGS Alignment: Stable grade 1 anterolisthesis of C5 on C6. Stable mild retrolisthesis of C3 on C4. Skull base and vertebrae: Multilevel mild degenerative changes spine . No severe osseous central canal stenosis or neural foraminal stenosis. No acute fracture. No aggressive appearing focal osseous lesion or focal pathologic process. Soft tissues and spinal canal: No prevertebral fluid or swelling. No visible canal hematoma. Upper chest: Unremarkable. Other: None. IMPRESSION: 1. No  acute intracranial abnormality. 2. No acute displaced fracture or traumatic listhesis of the cervical spine. Electronically Signed   By: Iven Finn M.D.   On: 01/02/2022 23:52   CT Cervical Spine Wo Contrast  Result Date: 01/02/2022 CLINICAL DATA:  Head trauma, minor (Age >= 65y); Neck trauma (Age >= 65y) EXAM: CT HEAD WITHOUT CONTRAST CT CERVICAL SPINE WITHOUT CONTRAST TECHNIQUE: Multidetector CT imaging of the head and cervical spine was performed following the standard protocol without intravenous contrast. Multiplanar CT image reconstructions of the cervical spine were also generated. RADIATION DOSE REDUCTION: This exam was performed according to the departmental dose-optimization program which includes automated exposure control, adjustment of the mA and/or kV according to patient size and/or use of iterative reconstruction technique. COMPARISON:  CT head and cervical spine 08/02/2021 FINDINGS: CT HEAD FINDINGS Brain: No evidence of large-territorial acute infarction. No parenchymal hemorrhage. No mass lesion. No extra-axial collection. No mass effect or midline shift. No hydrocephalus. Basilar cisterns are patent. Vascular: No hyperdense vessel. Atherosclerotic calcifications  are present within the cavernous internal carotid arteries. Skull: No acute fracture or focal lesion. Sinuses/Orbits: Left maxillary sinus mucosal thickening. Otherwise paranasal sinuses and mastoid air cells are clear. Bilateral lens replacement. Otherwise the orbits are unremarkable. Other: None. CT CERVICAL SPINE FINDINGS Alignment: Stable grade 1 anterolisthesis of C5 on C6. Stable mild retrolisthesis of C3 on C4. Skull base and vertebrae: Multilevel mild degenerative changes spine . No severe osseous central canal stenosis or neural foraminal stenosis. No acute fracture. No aggressive appearing focal osseous lesion or focal pathologic process. Soft tissues and spinal canal: No prevertebral fluid or swelling. No visible canal hematoma. Upper chest: Unremarkable. Other: None. IMPRESSION: 1. No acute intracranial abnormality. 2. No acute displaced fracture or traumatic listhesis of the cervical spine. Electronically Signed   By: Iven Finn M.D.   On: 01/02/2022 23:52   DG Chest Port 1 View  Result Date: 01/02/2022 CLINICAL DATA:  Questionable sepsis.  Evaluate abnormality. EXAM: PORTABLE CHEST 1 VIEW COMPARISON:  Chest radiograph dated October 16, 2021 FINDINGS: The heart is enlarged. Atherosclerotic calcification of the aortic arch. Biapical pleural/parenchymal scarring. Bibasilar atelectasis or infiltrate. No pleural effusion or pneumothorax. No acute osseous abnormality. IMPRESSION: 1. Bibasilar atelectasis or infiltrate. 2. Cardiomegaly. Electronically Signed   By: Keane Police D.O.   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  3) BM Bx- consistent with MPN NOS  PLAN: -Discussed lab results from today with the patient. CBC shows hemoglobin of 9.6 K and hematocrit of 33.9. CMP shows elevated  blood glucose of 190, elevated creatinine at 1.75, chloride level of 113. She is anemic during this visit.  -she is also iron deficient -Educated the patient that her infection could be the reason why she is anemic today.  Reasonable at this time to take po iron to target ferritin of 20-30 in setting of anemia and ckd. Would not over replace iron given concerns for developing polycythemia. -We will have lab results in 6 months and if everything is stable, the patient will be followed by his PCP. -No evidence of polycythemia or thrombocytosis related to her JAK2 mutation at this time. -Her CKD related anemia might be balancing out any concerning for polycythemia at this time. -No indication for starting hydroxyurea  or any other treatment for the patient's JAK2 positive MPN at this time.  FOLLOW-UP: Return to clinic with Dr. Irene Limbo with labs in 6 months  The total time spent in the appointment was 20 minutes* .  All of the patient's questions were answered with apparent satisfaction. The patient knows to call the clinic with any problems, questions or concerns.   Sullivan Lone MD MS AAHIVMS [facility] Hematology/Oncology Physician [facility]  .*Total Encounter Time as defined by the Centers for Medicare and Medicaid Services includes, in addition to the face-to-face time of a patient visit (documented in the note above) non-face-to-face time: obtaining and reviewing outside history, ordering and reviewing medications, tests or procedures, care coordination (communications with other health care professionals or caregivers) and documentation in the medical record.   I, Cleda Mccreedy, am acting as a Education administrator for Sullivan Lone, MD. .I have reviewed the above documentation for accuracy and completeness, and I agree with the above. Brunetta Genera MD

## 2022-01-29 NOTE — Telephone Encounter (Signed)
LM-01/29/22-Called pt. To complete a General Review Call. Unable to reach pt. [place] X2.  Total time spent: 2 min.

## 2022-01-30 NOTE — Telephone Encounter (Signed)
LM-01/30/22-Called pt. And completed a General Review Call.   Total time spent: 31 min.  General Review (HC)  Chart Review Have there been any documented new, changed, or discontinued medications since last visit?  (Include name, dose, frequency, date): No  Has there been any documented recent hospitalizations or ED visits since last visit with Clinical Lead?: Yes Brief Summary (including Medication and/or Diagnosis changes):: 11/16-11/24-[facility]-Discharge. Stop atenolol and furosemide, start midodrine. 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    Acquired hypothyroidism    Essential hypertension    DM2 (diabetes mellitus, type 2) ([place])    Stage 4 chronic kidney disease (HCC)    Anemia of chronic disease    Acute respiratory failure with hypoxia (HCC)    HCAP (healthcare-associated pneumonia) ruled out    Generalized weakness    Fall at home, initial encounter    History of seizures    Allergic rhinitis    Aortic stenosis    Chronic diastolic CHF (congestive heart failure) ([place])  Adherence Review Does the [facility] have access to medication refill data?: Yes Adherence rates for STAR metric medications: Humulin / 14 & 75DS / 09/08/21 & 12/31/21 Adherence rates for medications indicated for disease state being reviewed: Humulin / 14 & 75DS / 09/08/21 & 12/31/21 Does the patient have >5 day gap between last estimated fill dates for any of the above medications or other medication gaps?: Yes Reasons for medication gaps: Allopurinol 158m / 90 & 17DS / 07/11/21 & 09/10/21-See Misc. info Lansoprazole 332m/ 17 & 90DS / 09/10/21 & 12/19/21-See Misc. info Levetiracetam 50053m 90 & 17DS / 02/13/21 & 09/10/21-See Misc. info Levothyroxine 100m68m 90 & 18DS / 07/26/21 & 09/10/21-See Misc. info  Disease State  Questions Able to connect with the Patient?: Yes Did patient have any problems with their health recently?: Yes Note Problems and Concerns:: Pt. states she fell on november 15th. Pt. states she got up out of a chair and her oxygen levels dropped and he BP dropped causing pt. to fall. [facility] to go back home on 12/29. Pt. states she has a bloodstream infection and requires antibiotics through a PICC line. Pt. states she is still having a hard time breathing even with having oxygen 24/7. Pt. has made the nursing staff aware. Pt. also states she is having trouble with bilateral hand stiffness and numbness. [facility] Pt. has no questions/concerns to address to CP.  Have you had any admissions or emergency room visits or worsening of your condition(s) since last visit?: Yes Details of ED visit, hospital visit and/or worsening condition(s):: Pt. 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 Pt. was discharged to SNF on 11/24.  Have you had any visits with new specialists or providers since your last visit?: Yes Explain:: Pt. has been following up with an infectious disease specialist ManaRosiland Oz for hospitalization f/u.  Have you had any new health care problem(s) since your last visit?: Yes New problem(s) reported:: Pt. reported she has trouble breathing that started on 11/15 and Severe sepsis due to group B strep  bacteremia due to presumed infectious endocarditis  Have you run out of any of your medications since you last spoke with your clinical lead?: No Are there any medications you are not taking as prescribed?: No Are you having any issues or side effects with your medications?: No Do you have any other  health concerns or questions you want to discuss with your Clinical lead  before your next visit?: No Are there any health concerns that you feel we can do a better job addressing?: No Are you having any problems with any of the following since the last visit: (select all that apply) (Multiselect): Ambulating/walking, Bathing, Eating, Grooming, Toileting, Dressing, Climbing stairs, Driving, Sleeping Details: Pt. states she requires full assistance in completing ADL's and has the nursing staff where she lives help her d/t weakness.   Any falls since last visit?: Yes Details: Pt. had one fall on 11/15 since last visit.  Any increased or uncontrolled pain since last visit?: No Next visit Type:: Phone Visit with:: HC Date:: 01/30/2022 Time:: 8AM Additional Details: Was unable to address medication adherence. Pt. states she is unsure of what medications she is taking, she takes what the nursing staff at [facility] gives her. As far as pt. knows, she has no issues/side effects with any medications. Informed pt. I will plan to follow up with her once d/c from [facility] to ensure all care needs are met. Pt. verbalized understanding and agreed.

## 2022-01-31 ENCOUNTER — Encounter (HOSPITAL_COMMUNITY): Payer: Self-pay

## 2022-01-31 ENCOUNTER — Encounter: Payer: Self-pay | Admitting: Hematology

## 2022-02-03 ENCOUNTER — Encounter: Payer: Self-pay | Admitting: Hematology

## 2022-02-03 ENCOUNTER — Encounter (HOSPITAL_COMMUNITY): Payer: Self-pay

## 2022-02-06 NOTE — Progress Notes (Deleted)
MEDICARE ANNUAL WELLNESS VISIT AND FOLLOW UP  Assessment:    Encounter for Medicare annual wellness exam  Due Annually  Health Maintenance Reviewed  Healthy lifestyle reviewed and goals set   Secondary hyperparathyroidism monitor  Essential hypertension - continue medications, DASH diet, exercise and monitor at home. Call if greater than 130/80.  - CBC  OSA Does not use CPAP  CKD stage 4 due to type 2 diabetes mellitus (HCC) Increase fluids, avoid NSAIDS, monitor sugars, will monitor CMP  Type II diabetes mellitus with neurological manifestations [facility]) Discussed general issues about diabetes pathophysiology and management., Educational material distributed., Suggested low cholesterol diet., Encouraged aerobic exercise., Discussed foot care. Increase by 2 units in the AM if sugars over 150 Increase exercise, likely increased due to lack of activity with COVID Keep food and sugar log A1c  Hypothyroidism, unspecified type Hypothyroidism-check TSH level, continue medications the same, reminded to take on an empty stomach 30-49mns before food.  -TSH  Seizure disorder ([place] Continue meds  Type 2 diabetes mellitus with hyperlipidemia ([place] Patient reports  Insulin dependent diabetes mellitus ([place] Discussed general issues about diabetes pathophysiology and management., Educational material distributed., Suggested low cholesterol diet., Encouraged aerobic exercise., Discussed foot care. A1c  Hyperlipidemia check lipids- do crestor every other day since difficult to cut decrease fatty foods increase activity.  GOAL IS 70 Lipid panel  Morbid obesity (BMI 39.86)  (HCC) - follow up 3 months for progress monitoring - increase veggies, decrease carbs - long discussion about weight loss, diet, and exercise  Iron deficiency anemia, unspecified iron deficiency anemia type -     Due to CKD  Venous (peripheral) insufficiency - weight loss discussed, start compression  stockings and elevation  Benign neoplasm of colon, unspecified part of colon Monitor  Gastroesophageal reflux disease, esophagitis presence not specified Continue PPI/H2 blocker, diet discussed  Vitamin D deficiency Continue supplement  ACE inhibitor intolerance Due to CKD  Medication management 1 year  Non-pressure chronic ulcer of right heel and midfoot limited to breakdown of skin ([place]  Continue to follow with Dr. DSharol GivenEncourage daily skin inspection of feet  Over 40 minutes of face to face interview,  exam, counseling, chart review, and critical decision making was performed.  Future Appointments  Date Time Provider DK[place] 02/07/2022 11:00 AM WAlycia Rossetti NP GAAM-GAAIM None  02/13/2022 11:00 AM MCINF-RM2 MC-MCINF None  02/15/2022  2:00 PM [facility] 02/19/2022 10:30 AM MRosiland Oz MD RCID-RCID RCID  07/30/2022  1:30 PM CHCC-MED-ONC LAB CH[facility]-MEDONC None  07/30/2022  2:00 PM [facility]  08/06/2022 10:00 AM WAlycia Rossetti NP GAAM-GAAIM None  11/06/2022 10:00 AM MUnk Pinto MD GAAM-GAAIM None    Plan:   During the course of the visit the patient was educated and counseled about appropriate screening and preventive services including:    Pneumococcal vaccine  Influenza vaccine Td vaccine Prevnar 13 Screening electrocardiogram Screening mammography Bone densitometry screening Colorectal cancer screening Diabetes screening Glaucoma screening Nutrition counseling  Advanced directives: given info/requested copies.    Subjective:   [name] GIANNOTTIis a 88y.o. female who presents for Medicare Annual Wellness Visit and 3 month follow up on hypertension, prediabetes, hyperlipidemia, vitamin D def.  She reports overall she is doing well.  She has no health or medication concerns today. She uses freestyle libre to check blood glucose.  She is checking her blood glucose at times four times a day and  as needed.  She reports difficultly getting blood from the area.   Her blood pressure has been controlled at home, today their BP is  .  BP Readings from Last 3 Encounters:  01/29/22 134/70  01/23/22 (!) 143/77  01/11/22 (!) 135/93     Son drives her, she has not driven in several years. She lives at [place] home, has [employer] certified [employer], and has house so she gets out in her garden.   She does workout. She denies chest pain, shortness of breath, dizziness.   Has been following with derm, skin surgery center, history of [place] She is following with ortho for left knee pain.   Follows with neuro for seizures, Dr. Clabe Seal, she is on Keppra three times a day.    She has been working on diet and exercise for diabetes  with stage 4 CKD NOT on ACE due to CKD She does have a sore on the bottom of right foot, following with Dr. Sharol Given Hyperlipidemia on crestor 5 mg, has been take 1/2 pill but states that it is hard to cut, at goal she is on bASA  she is on 20 units of humulin in the morning and 20 at night  Sugars have been running 113-150 in the morning.  Lowest was 99 she was asymptomatic.  Highest 170's fasting. Eye Doctor Dr. Ellie Lunch denies foot ulcerations, hyperglycemia, hypoglycemia , increased appetite, nausea, paresthesia of the feet, polydipsia, polyuria, visual disturbances, vomiting and weight loss.  Last A1C in the office was:  Lab Results  Component Value Date   HGBA1C 5.7 (H) 11/05/2021   Last GFR Lab Results  Component Value Date   GFRAA 28 (L) 05/05/2020   Lab Results  Component Value Date   CHOL 112 11/05/2021   HDL 27 (L) 11/05/2021   LDLCALC 63 11/05/2021   TRIG 141 11/05/2021   CHOLHDL 4.1 11/05/2021    Patient is on Vitamin D supplement. Lab Results  Component Value Date   VD25OH 52 11/05/2021     BMI is There is no height or weight on file to calculate BMI., she is working on diet and exercise. Wt Readings from Last 3 Encounters:  01/11/22 181 lb 14.1 oz (82.5  kg)  11/05/21 176 lb (79.8 kg)  10/19/21 179 lb 10.8 oz (81.5 kg)   She is on thyroid medication. Her medication was not changed last visit.   Lab Results  Component Value Date   TSH 4.142 01/03/2022  .   Medication Review  Current Outpatient Medications (Endocrine & Metabolic):    insulin NPH-regular Human (HUMULIN 70/30) (70-30) 100 UNIT/ML injection, Inject 8 Units into the skin daily with breakfast.   levothyroxine (SYNTHROID) 100 MCG tablet, TAKE 1 TABLET BY MOUTH DAILY ON AN EMPTY STOMACH WITH ONLY WATER FOR 30 MINUTES. NO ANTACIDS, CALCIUM OR MAGNESIUM FOR 4 HOURS. AVOID BIOTIN (Patient taking differently: Take 100 mcg by mouth daily before breakfast.)  Current Outpatient Medications (Cardiovascular):    midodrine (PROAMATINE) 10 MG tablet, Take 1 tablet (10 mg total) by mouth 3 (three) times daily with meals.  Current Outpatient Medications (Respiratory):    loratadine (CLARITIN) 10 MG tablet, Take 10 mg by mouth daily.  Current Outpatient Medications (Analgesics):    acetaminophen (TYLENOL) 500 MG tablet, Take 1,000 mg by mouth every 6 (six) hours as needed for moderate pain.   allopurinol (ZYLOPRIM) 100 MG tablet, Take 1 tablet (100 mg total) by mouth 2 (two) times daily. Take  1 tablet  Daily  to prevent Gout.  aspirin 81 MG tablet, Take 81 mg by mouth daily.  Current Outpatient Medications (Hematological):    vitamin B-12 (CYANOCOBALAMIN) 100 MCG tablet, Take 500 mcg by mouth daily.  Current Outpatient Medications (Other):    BD VEO INSULIN SYRINGE U/F 31G X 15/64" 0.3 ML MISC, 2 (two) times daily.   Blood Glucose Monitoring Suppl (ONETOUCH VERIO) w/Device KIT, Check  Blood Sugar   4 x /day before Meals & Bedtime   (( Dx-  e11.22 ))   cefTRIAXone (ROCEPHIN) IVPB, Inject 2 g into the vein daily. Indication:  GBS bacteremia - empiric IE treatment  First Dose: Yes Last Day of Therapy:  02/15/2022 Labs - Once weekly:  CBC/D and BMP, Labs - Every other week:  ESR and CRP  Method of administration: IV Push Refer to interventional radiology for tunneled catheter removal at the completion of IV therapy  Method of administration may be changed at the discretion of home infusion pharmacist based upon assessment of the patient and/or caregiver's ability to self-administer the medication ordered.   cholecalciferol (VITAMIN D3) 25 MCG (1000 UNIT) tablet, Take 3,000 Units by mouth daily.   fish oil-omega-3 fatty acids 1000 MG capsule, Take 1,000 mg by mouth at bedtime.   glucose blood (ONETOUCH VERIO) test strip, Check Blood Sugar 4 x /day  (( Dx-  e11.22 ))   hydrocortisone (ANUSOL-HC) 2.5 % rectal cream, Place rectally 2 (two) times daily.   Lancets (ONETOUCH ULTRASOFT) lancets, Check Blood Sugar  4 x /day  before Meals & Bedtime   (( Dx-  e11.22 ))   lansoprazole (PREVACID) 30 MG capsule, TAKE 1 CAPSULE BY MOUTH DAILY TO PREVENT HEARTBURN AND INDIGESTION. (Patient taking differently: Take 30 mg by mouth daily.)   levETIRAcetam (KEPPRA) 500 MG tablet, Take  1 tablet 2  x /day  with Meals  to Prevent Seizures (Patient taking differently: 250 mg in the morning and 552m in the evening)   meclizine (ANTIVERT) 25 MG tablet, Take   1 tablet 2 to 3 x /day  ONLY  if needed for Dizziness /Vertigo   rOPINIRole (REQUIP) 1 MG tablet, TAKE ONE-HALF TO ONE TABLET BY MOUTH THREE TIMES DAILY AS NEEDED FOR RESTLESS LEGS& CRAMPS   vitamin C (ASCORBIC ACID) 500 MG tablet, Take 500 mg by mouth daily.  Current Problems (verified) Patient Active Problem List   Diagnosis Date Noted   PICC (peripherally inserted central catheter) in place 01/23/2022   Subacute bacterial endocarditis 01/23/2022   Medication management 01/23/2022   Severe sepsis (HX[place] 01/03/2022   HCAP (healthcare-associated pneumonia) 01/03/2022   Generalized weakness 01/03/2022   Fall at home, initial encounter 01/03/2022   History of seizures 01/03/2022   Allergic rhinitis 01/03/2022   Aortic stenosis 01/03/2022    Chronic diastolic CHF (congestive heart failure) (HB[place] 01/03/2022   Bacteremia due to group B Streptococcus 10/18/2021   Acute respiratory failure with hypoxia (HF[place] 10/14/2021   Benign neoplasm of cecum    Benign neoplasm of rectum    Blood in stool    Melena    Acute blood loss anemia    GI bleed 04/04/2020   Anemia of chronic disease 11/02/2019   DM neuropathy, type II diabetes mellitus (HCC)    Stage 4 chronic kidney disease (HCC)    Obesity (BMI 30-39.9)    High cholesterol    Aortic valve thickening by Cardiac echo in 2015 11/01/2019   Iron deficiency anemia 05/10/2019   Secondary hyperparathyroidism of renal origin ([place] 09/20/2018   DM2 (  diabetes mellitus, type 2) ([place]) 12/08/2017   ACE inhibitor intolerance 12/08/2017   Vertigo of central origin 11/20/2015   Morbid obesity ([place]) BMI 35+ with T2DM, htn, hyperlipidemia, CKD, OSA 12/12/2014   OSA on CPAP 08/08/2014   Type II diabetes mellitus with neurological manifestations ([place]) 09/15/2013   Hyperlipidemia associated with type 2 diabetes mellitus ([place]) 06/27/2013   Vitamin D deficiency 06/27/2013   Seizure disorder ([place]) 11/04/2012   Epilepsy ([place]) 11/04/2012   Anemia 06/13/2008   COLONIC POLYPS 06/09/2008   Acquired hypothyroidism 06/09/2008   CKD stage 4 due to type 2 diabetes mellitus ([place]) 06/09/2008   Venous (peripheral) insufficiency 06/09/2008   Gastroesophageal reflux disease 06/09/2008   Essential hypertension 06/09/2008    Screening Tests Immunization History  Administered Date(s) Administered   DTaP 03/18/2007   Influenza, High Dose Seasonal PF 10/18/2013, 12/12/2014, 10/24/2015, 10/28/2016, 12/08/2017, 12/01/2018   Influenza-Unspecified 11/18/2012, 12/02/2019, 11/24/2020   PFIZER(Purple Top)SARS-COV-2 Vaccination 03/10/2019, 03/31/2019, 11/26/2019   Pneumococcal Conjugate-13 12/12/2014   Pneumococcal Polysaccharide-23 09/16/2013   Td 12/08/2017   Zoster, Live 02/28/2005    Preventative care: Last  colonoscopy: 2010 no follow up due to age Last mammogram: 11/2017 , complete DEXA: 2017, declines Echo 2015 Sleep study 2016 SARS-COV2-Booster 11/26/19  Prior vaccinations: TD: 2019  Influenza: 11/24/20  Pneumococcal: 2015 Prevnar13: 2016 Shingles/Zostavax: 2007  Names of Other Physician/Practitioners you currently use: 1. [facility]- here for primary care 2. Dr. Ellie Lunch, eye doctor, last visit 2022 3. Dr. Lovena Le, dentist, last visit 2022  Patient Care Team: Garwin Brothers, MD as PCP - General (Internal Medicine) Rozetta Nunnery, MD (Inactive) as Consulting Physician (Otolaryngology) Teena Irani, MD (Inactive) as Consulting Physician (Gastroenterology) Marcy Panning, MD as Consulting Physician (Oncology) Claudia Desanctis, MD as Consulting Physician (Internal Medicine) [facility], [facility] (Inactive) as Pharmacist (Pharmacist)  Allergies Allergies  Allergen Reactions   Ppd [Tuberculin Purified Protein Derivative] Other (See Comments)    indurated    SURGICAL HISTORY She  has a past surgical history that includes Tonsillectomy (~ 1947); Wrist surgery (Left, ~ 1950); Cervical polypectomy; Hammer toe surgery (Left, 1980's); Mohs surgery (20087); Esophagogastroduodenoscopy (egd) with propofol (N/A, 04/05/2020); biopsy (04/05/2020); Colonoscopy with propofol (N/A, 04/06/2020); polypectomy (04/06/2020); IR Perc [employer] Cath Wo Port (01/09/2022); IR Fluoro Guide CV Line Right (01/09/2022); and IR US Guide Vasc Access Right (01/09/2022). FAMILY HISTORY Her family history includes Alcohol abuse in her son; Diabetes in her son; Heart disease in her father and mother; Hypertension in her son and son; Ovarian cancer in her sister; Stroke in her mother. SOCIAL HISTORY She  reports that she quit smoking about 38 years ago. Her smoking use included cigarettes. She has a 30.00 pack-year smoking history. She has never used smokeless tobacco. She reports current  alcohol use. She reports that she does not use drugs.  MEDICARE WELLNESS OBJECTIVES: Physical activity:   Cardiac risk factors:   Depression/mood screen:      11/05/2021    9:34 PM  Depression screen PHQ 2/9  Decreased Interest 0  Down, Depressed, Hopeless 0  PHQ - 2 Score 0    ADLs:     01/03/2022    2:00 AM 11/05/2021    9:35 PM  In your present state of health, do you have any difficulty performing the following activities:  Hearing? 1 1  Comment  hearing aids  Vision? 0 0  Difficulty concentrating or making decisions? 0 0  Walking or climbing stairs? 1 1  Comment  uses a walker  Dressing or bathing? 0 0  Doing errands, shopping? 0 1  Comment  requires transport     Cognitive Testing  Alert? Yes  Normal Appearance?Yes  Oriented to person? Yes  Place? Yes   Time? Yes  Recall of three objects?  2/3  Can perform simple calculations? Yes  Displays appropriate judgment?Yes  Can read the correct time from a watch face?Yes  EOL planning:    Review of Systems  Constitutional:  Negative for chills, fever and weight loss.  HENT:  Negative for congestion and hearing loss.   Eyes:  Negative for blurred vision and double vision.  Respiratory:  Positive for shortness of breath (with exertion). Negative for cough.   Cardiovascular:  Positive for leg swelling (uses compression stockings). Negative for chest pain, palpitations and orthopnea.  Gastrointestinal:  Negative for abdominal pain, constipation, diarrhea, heartburn, nausea and vomiting.  Genitourinary:  Negative for dysuria.  Musculoskeletal:  Negative for falls, joint pain and myalgias.  Skin:  Negative for rash.       Small lesion bottom of right foot  Neurological:  Positive for sensory change (feet and hands) and weakness (both legs). Negative for dizziness, tingling, tremors, loss of consciousness and headaches.  Psychiatric/Behavioral:  Negative for depression, memory loss and suicidal ideas.     Objective:    There were no vitals filed for this visit.   There is no height or weight on file to calculate BMI.  General Appearance: Well nourished, in no apparent distress. Eyes: PERRLA, EOMs, conjunctiva no swelling or erythema Sinuses: No Frontal/maxillary tenderness ENT/Mouth: Ext aud canals clear, TMs without erythema, bulging. No erythema, swelling, or exudate on post pharynx.  Tonsils not swollen or erythematous. Hearing normal.  Neck: Supple, thyroid normal.  Respiratory: Respiratory effort normal, BS equal bilaterally without rales, rhonchi, wheezing or stridor.  Cardio: RRR with no MRGs. Brisk peripheral pulses without edema.  Abdomen: Soft, + BS.  Non tender, no guarding, rebound, hernias, masses. Lymphatics: Non tender without lymphadenopathy.  Musculoskeletal: Full ROM, 5/5 strength, Normal gait Skin: Warm, dry 36m ulcer right heel Neuro: Cranial nerves intact. No cerebellar symptoms.  Psych: Awake and oriented X 3, normal affect, Insight and Judgment appropriate.   Medicare Attestation I have personally reviewed: The patient's medical and social history Their use of alcohol, tobacco or illicit drugs Their current medications and supplements The patient's functional ability including ADLs,fall risks, home safety risks, cognitive, and hearing and visual impairment Diet and physical activities Evidence for depression or mood disorders  The patient's weight, height, BMI, and visual acuity have been recorded in the chart.  I have made referrals, counseling, and provided education to the patient based on review of the above and I have provided the patient with a written personalized care plan for preventive services.     DAlycia Rossetti NP   02/02/2020

## 2022-02-07 ENCOUNTER — Telehealth (HOSPITAL_COMMUNITY): Payer: Self-pay | Admitting: Radiology

## 2022-02-07 ENCOUNTER — Ambulatory Visit: Payer: Medicare Other | Admitting: Nurse Practitioner

## 2022-02-07 DIAGNOSIS — D126 Benign neoplasm of colon, unspecified: Secondary | ICD-10-CM

## 2022-02-07 DIAGNOSIS — D509 Iron deficiency anemia, unspecified: Secondary | ICD-10-CM

## 2022-02-07 DIAGNOSIS — N2581 Secondary hyperparathyroidism of renal origin: Secondary | ICD-10-CM

## 2022-02-07 DIAGNOSIS — E039 Hypothyroidism, unspecified: Secondary | ICD-10-CM

## 2022-02-07 DIAGNOSIS — K219 Gastro-esophageal reflux disease without esophagitis: Secondary | ICD-10-CM

## 2022-02-07 DIAGNOSIS — G4733 Obstructive sleep apnea (adult) (pediatric): Secondary | ICD-10-CM

## 2022-02-07 DIAGNOSIS — E1169 Type 2 diabetes mellitus with other specified complication: Secondary | ICD-10-CM

## 2022-02-07 DIAGNOSIS — E1149 Type 2 diabetes mellitus with other diabetic neurological complication: Secondary | ICD-10-CM

## 2022-02-07 DIAGNOSIS — I872 Venous insufficiency (chronic) (peripheral): Secondary | ICD-10-CM

## 2022-02-07 DIAGNOSIS — Z79899 Other long term (current) drug therapy: Secondary | ICD-10-CM

## 2022-02-07 DIAGNOSIS — N184 Chronic kidney disease, stage 4 (severe): Secondary | ICD-10-CM

## 2022-02-07 DIAGNOSIS — L97411 Non-pressure chronic ulcer of right heel and midfoot limited to breakdown of skin: Secondary | ICD-10-CM

## 2022-02-07 DIAGNOSIS — G40909 Epilepsy, unspecified, not intractable, without status epilepticus: Secondary | ICD-10-CM

## 2022-02-07 DIAGNOSIS — Z789 Other specified health status: Secondary | ICD-10-CM

## 2022-02-07 DIAGNOSIS — E559 Vitamin D deficiency, unspecified: Secondary | ICD-10-CM

## 2022-02-07 DIAGNOSIS — I10 Essential (primary) hypertension: Secondary | ICD-10-CM

## 2022-02-07 DIAGNOSIS — E1122 Type 2 diabetes mellitus with diabetic chronic kidney disease: Secondary | ICD-10-CM

## 2022-02-07 DIAGNOSIS — Z0000 Encounter for general adult medical examination without abnormal findings: Secondary | ICD-10-CM

## 2022-02-07 NOTE — Telephone Encounter (Signed)
Returned call to Pamala Hurry with [employer] to confirm patient's appointment on 02/15/22. Left Pamala Hurry a detailed message with all of the information about the appointment for Ms. [name] [name]

## 2022-02-08 ENCOUNTER — Other Ambulatory Visit: Payer: Self-pay

## 2022-02-08 ENCOUNTER — Encounter (HOSPITAL_COMMUNITY): Payer: Self-pay

## 2022-02-08 ENCOUNTER — Emergency Department (HOSPITAL_COMMUNITY)
Admission: EM | Admit: 2022-02-08 | Discharge: 2022-02-09 | Disposition: A | Discharge status: Skilled Nursing Facility | Payer: Medicare Other | Attending: Emergency Medicine | Admitting: Emergency Medicine

## 2022-02-08 DIAGNOSIS — E039 Hypothyroidism, unspecified: Secondary | ICD-10-CM | POA: Insufficient documentation

## 2022-02-08 DIAGNOSIS — I509 Heart failure, unspecified: Secondary | ICD-10-CM | POA: Diagnosis not present

## 2022-02-08 DIAGNOSIS — E1122 Type 2 diabetes mellitus with diabetic chronic kidney disease: Secondary | ICD-10-CM | POA: Diagnosis not present

## 2022-02-08 DIAGNOSIS — R000 Tachycardia, unspecified: Secondary | ICD-10-CM | POA: Diagnosis not present

## 2022-02-08 DIAGNOSIS — Z7982 Long term (current) use of aspirin: Secondary | ICD-10-CM | POA: Diagnosis not present

## 2022-02-08 DIAGNOSIS — Z794 Long term (current) use of insulin: Secondary | ICD-10-CM | POA: Diagnosis not present

## 2022-02-08 DIAGNOSIS — T82898A Other specified complication of vascular prosthetic devices, implants and grafts, initial encounter: Secondary | ICD-10-CM | POA: Insufficient documentation

## 2022-02-08 DIAGNOSIS — N189 Chronic kidney disease, unspecified: Secondary | ICD-10-CM | POA: Diagnosis not present

## 2022-02-08 DIAGNOSIS — I130 Hypertensive heart and chronic kidney disease with heart failure and stage 1 through stage 4 chronic kidney disease, or unspecified chronic kidney disease: Secondary | ICD-10-CM | POA: Diagnosis not present

## 2022-02-08 DIAGNOSIS — T829XXA Unspecified complication of cardiac and vascular prosthetic device, implant and graft, initial encounter: Secondary | ICD-10-CM

## 2022-02-08 NOTE — ED Triage Notes (Addendum)
Pt to ED via [facility] from [facility].  Pt has been getting anbx from central line. Today they were unable to get port to flush.  EMS VS 148/68 HR=106 96% 3LNC (pts normal)  Pt denies any complaints other than not getting anbx.

## 2022-02-08 NOTE — ED Provider Triage Note (Signed)
Emergency Medicine Provider Triage Evaluation Note  [name] [name] , a 88 y.o. female  was evaluated in triage.  Pt complains of access issue with central line pain.  She reports that she is receiving IV antibiotics today was unable to push or flush medication into the system peers to be some level of obstruction and central line. Patient is afebrile and is comfortable during assessment.  Days chest pain, shortness of breath, rashes, abdominal pain, nausea, vomiting, or diarrhea.  Review of Systems  Positive: As above Negative: As above  Physical Exam  BP (!) 145/68   Pulse (!) 102   Temp 98.4 F (36.9 C) (Oral)   Resp 16   SpO2 98%  Gen:   Awake, no distress  Resp:  Normal effort MSK:   Moves extremities without difficulty  Other:  Central line site does not appear erythematous or evidence of cellulitis.  When looking at walking and lying it appears that there is a small plastic piece which is not flush with the rest of the tubing and may be part of the reason for the obstruction.  Medical Decision Making  Medically screening exam initiated at 4:42 PM.  Appropriate orders placed.  [name] was informed that the remainder of the evaluation will be completed by another provider, this initial triage assessment does not replace that evaluation, and the importance of remaining in the ED until their evaluation is complete.   Luvenia Heller, PA-C 02/08/22 1645

## 2022-02-09 ENCOUNTER — Emergency Department (HOSPITAL_COMMUNITY): Payer: Medicare Other

## 2022-02-09 MED ORDER — ALTEPLASE 2 MG IJ SOLR: 2.0000 mg | Freq: Once | INTRAMUSCULAR | Status: DC

## 2022-02-09 MED ORDER — SODIUM CHLORIDE 0.9 % IV SOLN
2.0000 g | Freq: Once | INTRAVENOUS | Status: AC
  Administered 2022-02-09: 2 g via INTRAVENOUS
  Filled 2022-02-09: qty 20

## 2022-02-09 NOTE — ED Provider Notes (Signed)
[facility] EMERGENCY DEPARTMENT Provider Note   CSN: [id number] Arrival date & time: 02/08/22  1541     History  Chief Complaint  Patient presents with   Vascular Access Problem    [name] [name] is a 88 y.o. female.  HPI Patient presents for central line issue.  Medical history includes hypothyroidism, CKD, anemia, GERD, HTN, seizures, HLD, T2DM, OSA, obesity, CHF.  She was hospitalized last month for bacteremia and presumed endocarditis.  She underwent central line placement with IR.  At time of discharge, plan was for continued Rocephin for 6 weeks.  Starting at today, patient's 1 was unable to be flushed.  She has not received antibiotics yesterday or today.  She has no physical complaints.    Home Medications Prior to Admission medications   Medication Sig Start Date End Date Taking? Authorizing Provider  acetaminophen (TYLENOL) 500 MG tablet Take 1,000 mg by mouth every 6 (six) hours as needed for moderate pain.   Yes [provider]  allopurinol (ZYLOPRIM) 100 MG tablet Take 1 tablet (100 mg total) by mouth 2 (two) times daily. Take  1 tablet  Daily  to prevent Gout. Patient taking differently: Take 100 mg by mouth 2 (two) times daily. 02/07/21  Yes Alycia Rossetti, NP  aspirin 81 MG tablet Take 81 mg by mouth daily.   Yes [provider]  cefTRIAXone (ROCEPHIN) IVPB Inject 2 g into the vein daily. Indication:  GBS bacteremia - empiric IE treatment  First Dose: Yes Last Day of Therapy:  02/15/2022 Labs - Once weekly:  CBC/D and BMP, Labs - Every other week:  ESR and CRP Method of administration: IV Push Refer to interventional radiology for tunneled catheter removal at the completion of IV therapy  Method of administration may be changed at the discretion of home infusion pharmacist based upon assessment of the patient and/or caregiver's ability to self-administer the medication ordered. Patient taking differently: Inject 2 g into  the vein daily. (01/12/22-02/15/22) 01/09/22 02/16/22 Yes Danford, Suann Larry, MD  cholecalciferol (VITAMIN D3) 25 MCG (1000 UNIT) tablet Take 3,000 Units by mouth every evening.   Yes [provider]  fish oil-omega-3 fatty acids 1000 MG capsule Take 1,000 mg by mouth at bedtime.   Yes [provider]  furosemide (LASIX) 40 MG tablet Take 40 mg by mouth in the morning.   Yes [provider]  hydrocortisone (ANUSOL-HC) 2.5 % rectal cream Place rectally 2 (two) times daily. Patient taking differently: Place 1 Application rectally 2 (two) times daily. 04/06/20  Yes Kayleen Memos, DO  insulin NPH-regular Human (HUMULIN 70/30) (70-30) 100 UNIT/ML injection Inject 8 Units into the skin daily with breakfast. 01/09/22  Yes Danford, Suann Larry, MD  lansoprazole (PREVACID) 30 MG capsule TAKE 1 CAPSULE BY MOUTH DAILY TO PREVENT HEARTBURN AND INDIGESTION. Patient taking differently: Take 30 mg by mouth daily in the afternoon. 12/19/20  Yes Alycia Rossetti, NP  levETIRAcetam (KEPPRA) 500 MG tablet Take  1 tablet 2  x /day  with Meals  to Prevent Seizures Patient taking differently: Take 500 mg by mouth 2 (two) times daily. 07/10/21  Yes Unk Pinto, MD  levothyroxine (SYNTHROID) 100 MCG tablet TAKE 1 TABLET BY MOUTH DAILY ON AN EMPTY STOMACH WITH ONLY WATER FOR 30 MINUTES. NO ANTACIDS, CALCIUM OR MAGNESIUM FOR 4 HOURS. AVOID BIOTIN Patient taking differently: Take 100 mcg by mouth daily. 11/29/20  Yes Alycia Rossetti, NP  loratadine (CLARITIN) 10 MG tablet Take  10 mg by mouth in the morning.   Yes [provider]  meclizine (ANTIVERT) 25 MG tablet Take   1 tablet 2 to 3 x /day  ONLY  if needed for Dizziness /Vertigo Patient taking differently: Take 25 mg by mouth every 8 (eight) hours as needed (vertigo). 07/10/21  Yes Unk Pinto, MD  melatonin 3 MG TABS tablet Take 3 mg by mouth at bedtime.   Yes [provider]  midodrine (PROAMATINE) 5 MG tablet  Take 5 mg by mouth every evening.   Yes [provider]  rOPINIRole (REQUIP) 1 MG tablet TAKE ONE-HALF TO ONE TABLET BY MOUTH THREE TIMES DAILY AS NEEDED FOR RESTLESS LEGS& CRAMPS Patient taking differently: Take 1 mg by mouth every 8 (eight) hours as needed (restless legs, cramps). 12/03/21  Yes Alycia Rossetti, NP  vitamin B-12 (CYANOCOBALAMIN) 100 MCG tablet Take 500 mcg by mouth daily.   Yes [provider]  vitamin C (ASCORBIC ACID) 500 MG tablet Take 500 mg by mouth in the morning.   Yes [provider]  midodrine (PROAMATINE) 10 MG tablet Take 1 tablet (10 mg total) by mouth 3 (three) times daily with meals. Patient not taking: Reported on 02/09/2022 01/09/22   Edwin Dada, MD      Allergies    Ppd [tuberculin purified protein derivative]    Review of Systems   Review of Systems  Constitutional:  Negative for chills and fever.  Respiratory:  Negative for shortness of breath.   Cardiovascular:  Negative for chest pain.  All other systems reviewed and are negative.   Physical Exam Updated Vital Signs BP (!) 150/59   Pulse (!) 110   Temp 98.4 F (36.9 C) (Oral)   Resp 20   Ht _0  (1.575 m)   Wt 87.5 kg   SpO2 98%   BMI 35.30 kg/m  Physical Exam Vitals and nursing note reviewed.  Constitutional:      General: She is not in acute distress.    Appearance: Normal appearance. She is well-developed. She is not ill-appearing, toxic-appearing or diaphoretic.  HENT:     Head: Normocephalic and atraumatic.     Right Ear: External ear normal.     Left Ear: External ear normal.     Nose: Nose normal.  Eyes:     Extraocular Movements: Extraocular movements intact.     Conjunctiva/sclera: Conjunctivae normal.  Cardiovascular:     Rate and Rhythm: Regular rhythm. Tachycardia present.     Comments: Centerline catheter in place in right upper chest. Pulmonary:     Effort: Pulmonary effort is normal. No respiratory distress.  Abdominal:      General: There is no distension.     Palpations: Abdomen is soft.     Tenderness: There is no abdominal tenderness.  Musculoskeletal:        General: No deformity. Normal range of motion.     Cervical back: Normal range of motion and neck supple.  Skin:    General: Skin is warm and dry.  Neurological:     General: No focal deficit present.     Mental Status: She is alert and oriented to person, place, and time.  Psychiatric:        Mood and Affect: Mood normal.        Behavior: Behavior normal.        Thought Content: Thought content normal.        Judgment: Judgment normal.     ED  Results / Procedures / Treatments   Labs (all labs ordered are listed, but only abnormal results are displayed) Labs Reviewed - No data to display  EKG None  Radiology DG Chest 1 View  Result Date: 02/09/2022 CLINICAL DATA:  Central line malfunction EXAM: CHEST  1 VIEW COMPARISON:  Chest x-ray dated January 03, 2022 FINDINGS: Cardiac and mediastinal contours are unchanged. Unchanged prominent pulmonary arteries, which can be seen in the setting of pulmonary hypertension. Right chest wall central venous catheter with tip positioned over the expected area of the upper SVC. Small bilateral pleural effusions with associated atelectasis. No evidence of pneumothorax. IMPRESSION: 1. Right chest wall central venous catheter with tip positioned over the expected area of the upper SVC. 2. Small bilateral pleural effusions and atelectasis. Electronically Signed   By: Yetta Glassman M.D.   On: 02/09/2022 13:45    Procedures Procedures    Medications Ordered in ED Medications  alteplase (CATHFLO ACTIVASE) injection 2 mg (has no administration in time range)  cefTRIAXone (ROCEPHIN) 2 g in sodium chloride 0.9 % 100 mL IVPB (0 g Intravenous Stopped 02/09/22 1440)    ED Course/ Medical Decision Making/ A&P                           Medical Decision Making Amount and/or Complexity of Data  Reviewed Radiology: ordered.  Risk Prescription drug management.   Patient presents for central line dysfunction.  This started yesterday when that line was unable to be flushed at her assisted living facility.  Per chart review, patient is prescribed 2 g of IV ceftriaxone daily.  On exam, she is overall well-appearing.  She denies any physical complaints at this time.  Vital signs on arrival are notable for mild tachycardia and moderate hypertension.  I was unable to flush CBC at bedside.  X-ray imaging was obtained to assess for breaks or kinks.  Dose of ceftriaxone was ordered.  IR was consulted.  IR team will come evaluate.  IR team was able to troubleshoot central catheter in were able to flush and get blood drawn back.  Patient was discharged in stable condition.        Final Clinical Impression(s) / ED Diagnoses Final diagnoses:  Central line complication, initial encounter    Rx / DC Orders ED Discharge Orders     None         Godfrey Pick, MD 02/09/22 1513

## 2022-02-09 NOTE — ED Notes (Signed)
Patient transported to X-ray 

## 2022-02-09 NOTE — Consult Note (Signed)
Patient was seen today regarding a blocked central line at the patient's appointment for IV antibiotics yesterday. The patient was seen in the ED and evaluated. The line was flushed with a great deal of resistance initially. Resistance decreased and approximately 76m of saline was able to be flushed into the line. Blood was able to aspirated from the line without any difficulty. From an IR perspective, additional intervention is not required at this time as the line appears to be functioning.   MLura Em PA-C 02/09/2022 2:45 PM

## 2022-02-09 NOTE — Care Management (Addendum)
[facility] called to check on patient as they saw she had not responded to vital signs call. Called front desk they confirmed with tech that she is still present in the ED waiting room. Call brittany [phone] when DC 1530 saw order to DC, called and left message with N+Bittany at [facility]

## 2022-02-09 NOTE — Discharge Instructions (Signed)
You received your dose of antibiotics here in the emergency department.  Continue home medications as prescribed.  Return to emergency department for any further concerns.

## 2022-02-09 NOTE — ED Notes (Signed)
Called Pt for vitals multiple times, no response

## 2022-02-10 ENCOUNTER — Encounter (HOSPITAL_COMMUNITY): Payer: Self-pay

## 2022-02-10 ENCOUNTER — Emergency Department (HOSPITAL_COMMUNITY)
Admission: EM | Admit: 2022-02-10 | Discharge: 2022-02-10 | Disposition: A | Discharge status: Skilled Nursing Facility | Payer: Medicare Other | Attending: Emergency Medicine | Admitting: Emergency Medicine

## 2022-02-10 ENCOUNTER — Other Ambulatory Visit: Payer: Self-pay

## 2022-02-10 DIAGNOSIS — Z7982 Long term (current) use of aspirin: Secondary | ICD-10-CM | POA: Insufficient documentation

## 2022-02-10 DIAGNOSIS — T82598A Other mechanical complication of other cardiac and vascular devices and implants, initial encounter: Secondary | ICD-10-CM | POA: Insufficient documentation

## 2022-02-10 DIAGNOSIS — Z794 Long term (current) use of insulin: Secondary | ICD-10-CM | POA: Diagnosis not present

## 2022-02-10 DIAGNOSIS — Y732 Prosthetic and other implants, materials and accessory gastroenterology and urology devices associated with adverse incidents: Secondary | ICD-10-CM | POA: Diagnosis not present

## 2022-02-10 DIAGNOSIS — Z789 Other specified health status: Secondary | ICD-10-CM

## 2022-02-10 NOTE — ED Notes (Signed)
[facility] on unit to transport pt back to facility.

## 2022-02-10 NOTE — Progress Notes (Signed)
IVT consult placed to assess CVC. Patient has been seen in the ED recently due to inability to flush line. Line assessed; old drainage present; dressing due to be changed 12/23. Cap changed, flushed without difficulty with 10m saline.   Dressing changed. Stat lock added for additional support; line is sutured in place with old blood present. Line flushed with additional 30 ml's of saline. No other interventions needed at this time.   Layman Gully RLorita Officer RN

## 2022-02-10 NOTE — ED Notes (Signed)
This RN attempted to call [facility] x 2 for nursing report. No answer.

## 2022-02-10 NOTE — ED Triage Notes (Signed)
Pt BIB [facility] from [facility] c/o a clogged central line. Pt was just gere 2 days ago for the same thing. Per staff they tried to flush it around 1330 with no luck.

## 2022-02-10 NOTE — ED Provider Notes (Signed)
[facility] EMERGENCY DEPARTMENT Provider Note   CSN: [id number] Arrival date & time: 02/10/22  1406     History  Chief Complaint  Patient presents with   Vascular Access Problem    [name] [name] is a 88 y.o. female who presents to the emergency department with concerns for clogged PICC line. Notes that she was here 2 days ago for similar symptoms. Staff at facility tried to flush today at 1:30 PM without luck. No chest pain, shortness of breath. Pt notes that she has been treated with IV abx due to endocarditis. Unable to receive her dose today due to her access site being clogged.    The history is provided by the patient. No language interpreter was used.       Home Medications Prior to Admission medications   Medication Sig Start Date End Date Taking? Authorizing Provider  acetaminophen (TYLENOL) 500 MG tablet Take 1,000 mg by mouth every 6 (six) hours as needed for moderate pain.    [provider]  allopurinol (ZYLOPRIM) 100 MG tablet Take 1 tablet (100 mg total) by mouth 2 (two) times daily. Take  1 tablet  Daily  to prevent Gout. Patient taking differently: Take 100 mg by mouth 2 (two) times daily. 02/07/21   Alycia Rossetti, NP  aspirin 81 MG tablet Take 81 mg by mouth daily.    [provider]  cefTRIAXone (ROCEPHIN) IVPB Inject 2 g into the vein daily. Indication:  GBS bacteremia - empiric IE treatment  First Dose: Yes Last Day of Therapy:  02/15/2022 Labs - Once weekly:  CBC/D and BMP, Labs - Every other week:  ESR and CRP Method of administration: IV Push Refer to interventional radiology for tunneled catheter removal at the completion of IV therapy  Method of administration may be changed at the discretion of home infusion pharmacist based upon assessment of the patient and/or caregiver's ability to self-administer the medication ordered. Patient taking differently: Inject 2 g into the vein daily. (01/12/22-02/15/22)  01/09/22 02/16/22  Danford, Suann Larry, MD  cholecalciferol (VITAMIN D3) 25 MCG (1000 UNIT) tablet Take 3,000 Units by mouth every evening.    [provider]  fish oil-omega-3 fatty acids 1000 MG capsule Take 1,000 mg by mouth at bedtime.    [provider]  furosemide (LASIX) 40 MG tablet Take 40 mg by mouth in the morning.    [provider]  hydrocortisone (ANUSOL-HC) 2.5 % rectal cream Place rectally 2 (two) times daily. Patient taking differently: Place 1 Application rectally 2 (two) times daily. 04/06/20   Kayleen Memos, DO  insulin NPH-regular Human (HUMULIN 70/30) (70-30) 100 UNIT/ML injection Inject 8 Units into the skin daily with breakfast. 01/09/22   Danford, Suann Larry, MD  lansoprazole (PREVACID) 30 MG capsule TAKE 1 CAPSULE BY MOUTH DAILY TO PREVENT HEARTBURN AND INDIGESTION. Patient taking differently: Take 30 mg by mouth daily in the afternoon. 12/19/20   Alycia Rossetti, NP  levETIRAcetam (KEPPRA) 500 MG tablet Take  1 tablet 2  x /day  with Meals  to Prevent Seizures Patient taking differently: Take 500 mg by mouth 2 (two) times daily. 07/10/21   Unk Pinto, MD  levothyroxine (SYNTHROID) 100 MCG tablet TAKE 1 TABLET BY MOUTH DAILY ON AN EMPTY STOMACH WITH ONLY WATER FOR 30 MINUTES. NO ANTACIDS, CALCIUM OR MAGNESIUM FOR 4 HOURS. AVOID BIOTIN Patient taking differently: Take 100 mcg by mouth daily. 11/29/20   Alycia Rossetti, NP  loratadine Shearon Balo)  10 MG tablet Take 10 mg by mouth in the morning.    [provider]  meclizine (ANTIVERT) 25 MG tablet Take   1 tablet 2 to 3 x /day  ONLY  if needed for Dizziness /Vertigo Patient taking differently: Take 25 mg by mouth every 8 (eight) hours as needed (vertigo). 07/10/21   Unk Pinto, MD  melatonin 3 MG TABS tablet Take 3 mg by mouth at bedtime.    [provider]  midodrine (PROAMATINE) 10 MG tablet Take 1 tablet (10 mg total) by mouth 3 (three) times daily with  meals. Patient not taking: Reported on 02/09/2022 01/09/22   Edwin Dada, MD  midodrine (PROAMATINE) 5 MG tablet Take 5 mg by mouth every evening.    [provider]  rOPINIRole (REQUIP) 1 MG tablet TAKE ONE-HALF TO ONE TABLET BY MOUTH THREE TIMES DAILY AS NEEDED FOR RESTLESS LEGS& CRAMPS Patient taking differently: Take 1 mg by mouth every 8 (eight) hours as needed (restless legs, cramps). 12/03/21   Alycia Rossetti, NP  vitamin B-12 (CYANOCOBALAMIN) 100 MCG tablet Take 500 mcg by mouth daily.    [provider]  vitamin C (ASCORBIC ACID) 500 MG tablet Take 500 mg by mouth in the morning.    [provider]      Allergies    Ppd [tuberculin purified protein derivative]    Review of Systems   Review of Systems  All other systems reviewed and are negative.   Physical Exam Updated Vital Signs BP 132/60 (BP Location: Left Arm)   Pulse 99   Temp 98.5 F (36.9 C) (Oral)   Resp 16   SpO2 100%  Physical Exam Vitals and nursing note reviewed.  Constitutional:      General: She is not in acute distress.    Appearance: Normal appearance.  Eyes:     General: No scleral icterus.    Extraocular Movements: Extraocular movements intact.  Cardiovascular:     Rate and Rhythm: Normal rate and regular rhythm.     Pulses: Normal pulses.     Heart sounds: Normal heart sounds.  Pulmonary:     Effort: Pulmonary effort is normal. No respiratory distress.     Breath sounds: Normal breath sounds.  Chest:     Comments: PICC line in place to left upper chest wall without TTP.  Abdominal:     Palpations: Abdomen is soft. There is no mass.     Tenderness: There is no abdominal tenderness.  Musculoskeletal:        General: Normal range of motion.     Cervical back: Neck supple.  Skin:    General: Skin is warm and dry.     Findings: No rash.  Neurological:     Mental Status: She is alert.     Sensory: Sensation is intact.     Motor: Motor function is  intact.  Psychiatric:        Behavior: Behavior normal.     ED Results / Procedures / Treatments   Labs (all labs ordered are listed, but only abnormal results are displayed) Labs Reviewed - No data to display  EKG None  Radiology DG Chest 1 View  Result Date: 02/09/2022 CLINICAL DATA:  Central line malfunction EXAM: CHEST  1 VIEW COMPARISON:  Chest x-ray dated January 03, 2022 FINDINGS: Cardiac and mediastinal contours are unchanged. Unchanged prominent pulmonary arteries, which can be seen in the setting of pulmonary hypertension. Right chest wall central venous catheter with  tip positioned over the expected area of the upper SVC. Small bilateral pleural effusions with associated atelectasis. No evidence of pneumothorax. IMPRESSION: 1. Right chest wall central venous catheter with tip positioned over the expected area of the upper SVC. 2. Small bilateral pleural effusions and atelectasis. Electronically Signed   By: Yetta Glassman M.D.   On: 02/09/2022 13:45    Procedures Procedures    Medications Ordered in ED Medications - No data to display  ED Course/ Medical Decision Making/ A&P                           Medical Decision Making  Pt presents with concerns for vascular access problem onset PTA. Vital signs, pt afebrile. On exam, pt with no chest wall TTP. No acute cardiovascular, respiratory exam findings.   Additional history obtained:  External records from outside source obtained and reviewed including: Patient was evaluated in the emergency department on yesterday, 02/09/2022 for similar concerns.  At that time her PICC line was flushed and she was given her home dose of Rocephin.   Disposition: Presentation concerning for issues with her vascular access.  Access was flushed by IV team in the emergency department without difficulty. After consideration of the diagnostic results and the patients response to treatment, I feel that the patient would benefit from  Discharge home.  Patient to be discharged back to facility.  Supportive care measures and strict return precautions discussed with patient at bedside. Pt acknowledges and verbalizes understanding. Pt appears safe for discharge. Follow up as indicated in discharge paperwork.    This chart was dictated using voice recognition software, Dragon. Despite the best efforts of this provider to proofread and correct errors, errors may still occur which can change documentation meaning.   Final Clinical Impression(s) / ED Diagnoses Final diagnoses:  Problem with vascular access    Rx / DC Orders ED Discharge Orders     None         Shakiyah Cirilo A, PA-C 02/10/22 1600    Regan Lemming, MD 02/10/22 1740

## 2022-02-10 NOTE — Discharge Instructions (Addendum)
It was a pleasure taking care of you today!  Your PICC line was flushed today.  It is important for the staff at your facility to flush your PICC line every 12 hours.  Attached is information on how to care for your PICC line at home at your facility.  You may follow-up with your care team as needed.  Return to the emergency department if you are experiencing increasing/worsening symptoms.

## 2022-02-10 NOTE — ED Notes (Signed)
PTAR was called for this PT

## 2022-02-10 NOTE — ED Provider Triage Note (Addendum)
Emergency Medicine Provider Triage Evaluation Note  [name] [name] , [name] 88 y.o. female  was evaluated in triage.  Pt complains of concerns for clogged central line. Notes that she was here 2 days ago for similar symptoms. Staff at facility tried to flush today at 1:30 PM without luck. No chest pain, shortness of breath. Pt notes that she has been treated with IV abx due to endocarditis. Unable to receive her dose today due to her access site being clogged.   Review of Systems  Positive:  Negative:   Physical Exam  BP (!) 144/53 (BP Location: Right Arm)   Pulse (!) 106   Temp 98.1 F (36.7 C) (Oral)   Resp (!) 21   SpO2 95%  Gen:   Awake, no distress   Resp:  Normal effort  MSK:   Moves extremities without difficulty  Other:    Medical Decision Making  Medically screening exam initiated at 2:27 PM.  Appropriate orders placed.  [name] [name] was informed that the remainder of the evaluation will be completed by another provider, this initial triage assessment does not replace that evaluation, and the importance of remaining in the ED until their evaluation is complete.     [name] Macari A, PA-C 02/10/22 1458    [name] Ruffner A, PA-C 02/10/22 1511

## 2022-02-12 ENCOUNTER — Other Ambulatory Visit (HOSPITAL_COMMUNITY): Payer: Self-pay | Admitting: *Deleted

## 2022-02-12 DIAGNOSIS — Z959 Presence of cardiac and vascular implant and graft, unspecified: Secondary | ICD-10-CM | POA: Diagnosis not present

## 2022-02-12 DIAGNOSIS — Z792 Long term (current) use of antibiotics: Secondary | ICD-10-CM | POA: Diagnosis not present

## 2022-02-13 ENCOUNTER — Encounter (HOSPITAL_COMMUNITY)
Admission: RE | Admit: 2022-02-13 | Discharge: 2022-02-13 | Disposition: A | Discharge status: Home / Self Care | Payer: Medicare Other | Source: Ambulatory Visit | Attending: Nephrology | Admitting: Nephrology

## 2022-02-13 DIAGNOSIS — N189 Chronic kidney disease, unspecified: Secondary | ICD-10-CM | POA: Diagnosis present

## 2022-02-13 DIAGNOSIS — D631 Anemia in chronic kidney disease: Secondary | ICD-10-CM | POA: Insufficient documentation

## 2022-02-13 MED ORDER — SODIUM CHLORIDE 0.9 % IV SOLN
510.0000 mg | Freq: Once | INTRAVENOUS | Status: AC
  Administered 2022-02-13: 510 mg via INTRAVENOUS
  Filled 2022-02-13: qty 17

## 2022-02-13 MED ORDER — HEPARIN SOD (PORK) LOCK FLUSH 100 UNIT/ML IV SOLN
INTRAVENOUS | Status: AC
  Administered 2022-02-13: 250 [IU]
  Filled 2022-02-13: qty 5

## 2022-02-13 MED ORDER — HEPARIN SOD (PORK) LOCK FLUSH 100 UNIT/ML IV SOLN: 250.0000 [IU] | INTRAVENOUS | Status: AC | PRN

## 2022-02-15 ENCOUNTER — Ambulatory Visit (HOSPITAL_COMMUNITY)
Admission: RE | Admit: 2022-02-15 | Discharge: 2022-02-15 | Disposition: A | Discharge status: Home / Self Care | Payer: Medicare Other | Source: Ambulatory Visit | Attending: Infectious Diseases | Admitting: Infectious Diseases

## 2022-02-15 DIAGNOSIS — Z452 Encounter for adjustment and management of vascular access device: Secondary | ICD-10-CM | POA: Insufficient documentation

## 2022-02-15 HISTORY — PX: IR REMOVAL TUN CV CATH W/O FL: IMG2289

## 2022-02-17 DIAGNOSIS — E7800 Pure hypercholesterolemia, unspecified: Secondary | ICD-10-CM | POA: Diagnosis not present

## 2022-02-17 DIAGNOSIS — N184 Chronic kidney disease, stage 4 (severe): Secondary | ICD-10-CM | POA: Diagnosis not present

## 2022-02-17 DIAGNOSIS — E1122 Type 2 diabetes mellitus with diabetic chronic kidney disease: Secondary | ICD-10-CM | POA: Diagnosis not present

## 2022-02-19 ENCOUNTER — Encounter (HOSPITAL_COMMUNITY): Payer: Self-pay

## 2022-02-19 ENCOUNTER — Encounter: Payer: Self-pay | Admitting: Hematology

## 2022-02-19 ENCOUNTER — Ambulatory Visit: Payer: Medicare Other | Admitting: Infectious Diseases

## 2022-02-20 NOTE — Telephone Encounter (Signed)
LM-02/20/22-Called pt. To see if she had been d/c from [facility] yet, pt. States she is not sure when or if she will be d/c. I will call patient in two months to f/u. (5 min.)

## 2022-02-21 DIAGNOSIS — R2242 Localized swelling, mass and lump, left lower limb: Secondary | ICD-10-CM

## 2022-02-21 DIAGNOSIS — E1165 Type 2 diabetes mellitus with hyperglycemia: Secondary | ICD-10-CM | POA: Diagnosis not present

## 2022-02-25 DIAGNOSIS — R2242 Localized swelling, mass and lump, left lower limb: Secondary | ICD-10-CM

## 2022-02-25 DIAGNOSIS — S20412A Abrasion of left back wall of thorax, initial encounter: Secondary | ICD-10-CM

## 2022-02-25 DIAGNOSIS — E039 Hypothyroidism, unspecified: Secondary | ICD-10-CM

## 2022-02-27 ENCOUNTER — Encounter (HOSPITAL_COMMUNITY): Payer: Self-pay

## 2022-02-27 ENCOUNTER — Encounter: Payer: Self-pay | Admitting: Hematology

## 2022-03-01 ENCOUNTER — Other Ambulatory Visit: Payer: Self-pay

## 2022-03-01 ENCOUNTER — Ambulatory Visit: Payer: BLUE CROSS/BLUE SHIELD | Admitting: Internal Medicine

## 2022-03-01 ENCOUNTER — Encounter: Payer: Self-pay | Admitting: Internal Medicine

## 2022-03-01 VITALS — BP 122/77 | HR 93 | Resp 16 | Ht 62.0 in | Wt 193.0 lb

## 2022-03-01 DIAGNOSIS — R7881 Bacteremia: Secondary | ICD-10-CM

## 2022-03-01 DIAGNOSIS — B951 Streptococcus, group B, as the cause of diseases classified elsewhere: Secondary | ICD-10-CM

## 2022-03-01 DIAGNOSIS — Z452 Encounter for adjustment and management of vascular access device: Secondary | ICD-10-CM

## 2022-03-01 NOTE — Assessment & Plan Note (Signed)
She is s/p treatment with no new concerns now 10 days post completion.   No further antibiotics indicated at this time Follow up as needed  I have personally spent 30 minutes involved in face-to-face and non-face-to-face activities for this patient on the day of the visit. Professional time spent includes the following activities: Preparing to see the patient (review of tests), Obtaining and/or reviewing separately obtained history (admission/discharge record), Performing a medically appropriate examination and/or evaluation , Ordering medications/tests/procedures, referring and communicating with other health care professionals, Documenting clinical information in the EMR, Independently interpreting results (not separately reported), Communicating results to the patient/family/caregiver, Counseling and educating the patient/family/caregiver and Care coordination (not separately reported).

## 2022-03-01 NOTE — Assessment & Plan Note (Signed)
No removed.  No issues.

## 2022-03-01 NOTE — Progress Notes (Signed)
   Subjective:    Patient ID: [name] [name], female    DOB: Apr 19, 1932, 89 y.o.   MRN: [id number]  HPI [name] [name] is here for hsfu She has a history of myeloproliferative disorder and hospitalized in November with group B Strep bacteremia.  TTE without vegetation and unable to get TEE.  Treated for 6 weeks and completed trreatment 02/15/22.  Also had group B strep bacteremia in August 2023.   Doing well now, no fever, no chills.    Review of Systems  Constitutional:  Negative for chills and fever.  Gastrointestinal:  Negative for diarrhea.  Skin:  Negative for rash.       Objective:   Physical Exam Eyes:     General: No scleral icterus. Pulmonary:     Effort: Pulmonary effort is normal.  Neurological:     Mental Status: She is alert.           Assessment & Plan:

## 2022-04-02 ENCOUNTER — Ambulatory Visit: Payer: Medicare Other | Admitting: Internal Medicine

## 2022-04-02 ENCOUNTER — Encounter: Payer: Self-pay | Admitting: Internal Medicine

## 2022-04-02 VITALS — BP 124/78 | HR 103 | Temp 97.2°F | Resp 18 | Ht 63.0 in | Wt 167.1 lb

## 2022-04-02 DIAGNOSIS — E785 Hyperlipidemia, unspecified: Secondary | ICD-10-CM

## 2022-04-02 DIAGNOSIS — J9601 Acute respiratory failure with hypoxia: Secondary | ICD-10-CM | POA: Diagnosis not present

## 2022-04-02 DIAGNOSIS — J9691 Respiratory failure, unspecified with hypoxia: Secondary | ICD-10-CM | POA: Insufficient documentation

## 2022-04-02 DIAGNOSIS — Z794 Long term (current) use of insulin: Secondary | ICD-10-CM

## 2022-04-02 DIAGNOSIS — E1169 Type 2 diabetes mellitus with other specified complication: Secondary | ICD-10-CM | POA: Diagnosis not present

## 2022-04-02 DIAGNOSIS — E1140 Type 2 diabetes mellitus with diabetic neuropathy, unspecified: Secondary | ICD-10-CM

## 2022-04-02 DIAGNOSIS — L03011 Cellulitis of right finger: Secondary | ICD-10-CM

## 2022-04-02 DIAGNOSIS — E039 Hypothyroidism, unspecified: Secondary | ICD-10-CM

## 2022-04-02 MED ORDER — CEPHALEXIN 500 MG PO CAPS: 500.0000 mg | ORAL_CAPSULE | Freq: Four times a day (QID) | ORAL | 0 refills | Status: AC

## 2022-04-02 NOTE — Progress Notes (Addendum)
   Office Visit  Subjective   Patient ID: [name] [name]   DOB: 07-Mar-1932   Age: 89 y.o.   MRN: [id number]   Chief Complaint Chief Complaint  Patient presents with   Hand Pain    Ingrown nail on Rt. Middle finger.     History of Present Illness 89 years old female is here c/o right middle finger nail infection with ingrowing nail. She says it hurt and yesterday she drained pus from it by pressing it.  She has strep G B bacteremia and finished 6 weeks of antibiotic. Her infection is resolved.  She is on 2 L oxygen since November and feel like she wanted to come off oxygen now. She has diabetes mellitus and is on Nov 70/30 using sliding scale right now.   Past Medical History Past Medical History:  Diagnosis Date   Anemia, unspecified    Arthritis    "knees; right shoulder" (09/15/2013)   Basal cell carcinoma    "right upper outer lip"   DM neuropathy, type II diabetes mellitus ([place])    Dyspnea 2021   with low iron   GERD (gastroesophageal reflux disease)    Gout    High cholesterol    Hypertension    Hypothyroidism    Meniere's disease    Obesity (BMI 30-39.9)    Other forms of epilepsy and recurrent seizures without mention of intractable epilepsy 11/04/2012   Non convulsive paroxysmal spells, responding to [facility], patient not driving.    Pneumonia ~ 1943   Skin cancer    "forehead; right hand"   Stage 4 chronic kidney disease ([place])    UTI (urinary tract infection)    Vitamin D deficiency      Allergies Allergies  Allergen Reactions   Ppd [Tuberculin Purified Protein Derivative] Other (See Comments)    indurated     Review of Systems Review of Systems  Constitutional: Negative.   Respiratory: Negative.         Objective:    Vitals BP 124/78 (BP Location: Left Arm, Patient Position: Sitting, Cuff Size: Normal)   Pulse (!) 103   Temp (!) 97.2 F (36.2 C)   Resp 18   Ht '5\' 3"'$  (1.6 m)   Wt 167 lb 2 oz (75.8 kg)   SpO2 98%   BMI 29.60 kg/m     Physical Examination Physical Exam Constitutional:      Appearance: Normal appearance.  Cardiovascular:     Rate and Rhythm: Normal rate.     Heart sounds: Normal heart sounds.  Neurological:     Mental Status: She is alert.        Assessment & Plan:   Paronychia of finger, right I will give cephalexin for 7 days and she may need to cut her ingroin nail off  Respiratory failure with hypoxia (HCC) Her saturation was 90 % on RA for 10 minutes here. She will decrease oxygen to 1 L via [place] and then reassess her in next visit  DM neuropathy, type II diabetes mellitus ([place]) I will do HBA1c and lipid panel then reasses    Return in about 1 week (around 04/09/2022).   Garwin Brothers, MD

## 2022-04-02 NOTE — Assessment & Plan Note (Signed)
Her saturation was 90 % on RA for 10 minutes here. She will decrease oxygen to 1 L via [place] and then reassess her in next visit

## 2022-04-02 NOTE — Assessment & Plan Note (Signed)
I will do HBA1c and lipid panel then reasses

## 2022-04-02 NOTE — Assessment & Plan Note (Signed)
I will give cephalexin for 7 days and she may need to cut her ingroin nail off

## 2022-04-09 ENCOUNTER — Encounter: Payer: Self-pay | Admitting: Internal Medicine

## 2022-04-09 ENCOUNTER — Ambulatory Visit: Payer: Medicare Other | Admitting: Internal Medicine

## 2022-04-09 VITALS — BP 130/80 | HR 109 | Temp 98.0°F | Resp 18 | Ht 63.0 in | Wt 166.5 lb

## 2022-04-09 DIAGNOSIS — I10 Essential (primary) hypertension: Secondary | ICD-10-CM

## 2022-04-09 DIAGNOSIS — E1122 Type 2 diabetes mellitus with diabetic chronic kidney disease: Secondary | ICD-10-CM

## 2022-04-09 DIAGNOSIS — R000 Tachycardia, unspecified: Secondary | ICD-10-CM | POA: Diagnosis not present

## 2022-04-09 DIAGNOSIS — N184 Chronic kidney disease, stage 4 (severe): Secondary | ICD-10-CM

## 2022-04-09 DIAGNOSIS — E039 Hypothyroidism, unspecified: Secondary | ICD-10-CM | POA: Diagnosis not present

## 2022-04-09 NOTE — Assessment & Plan Note (Signed)
Her blood pressure was low when she was in the hospital with sepsis and she was started on midodrine.  Her blood pressure is better and she has a tachycardia.  I will stop midodrine and we will monitor her blood pressure.

## 2022-04-09 NOTE — Assessment & Plan Note (Signed)
I will do TSH level today

## 2022-04-09 NOTE — Progress Notes (Signed)
Office Visit  Subjective   Patient ID: [name] [name]   DOB: [birth year]   Age: 89 y.o.   MRN: [id number]   Chief Complaint Chief Complaint  Patient presents with   Follow-up    Finger infection     History of Present Illness 89 years old female who is here for follow-up for right middle finger infection.  She has completed antibiotic course and she says that her infection has resolved and she does not hurt. She is complaining of increased shortness of breath today.  She is on 2 L oxygen via nasal cannula her heart rate is also above 100 and she says that her blood pressure has been high. She also has a history of diabetes mellitus and is due for hemoglobin A1c TSH and lipid panel today.  She has a CKD 3b/CKD 4 and on last blood test her kidney function was slightly better.  Will repeat kidney function today. She has a history of aortic stenosis but does not follow with cardiologist.  Past Medical History Past Medical History:  Diagnosis Date   Anemia, unspecified    Arthritis    "knees; right shoulder" (09/15/2013)   Basal cell carcinoma    "right upper outer lip"   DM neuropathy, type II diabetes mellitus ([place])    Dyspnea 2021   with low iron   GERD (gastroesophageal reflux disease)    Gout    High cholesterol    Hypertension    Hypothyroidism    Meniere's disease    Obesity (BMI 30-39.9)    Other forms of epilepsy and recurrent seizures without mention of intractable epilepsy 11/04/2012   Non convulsive paroxysmal spells, responding to [facility], patient not driving.    Pneumonia ~ 1943   Skin cancer    "forehead; right hand"   Stage 4 chronic kidney disease ([place])    UTI (urinary tract infection)    Vitamin D deficiency      Allergies Allergies  Allergen Reactions   Ppd [Tuberculin Purified Protein Derivative] Other (See Comments)    indurated     Review of Systems Review of Systems  Constitutional: Negative.   Respiratory:  Negative for shortness of breath.    Cardiovascular: Negative.   Gastrointestinal: Negative.        Objective:    Vitals BP 130/80 (BP Location: Right Leg, Patient Position: Sitting, Cuff Size: Normal)   Pulse (!) 109   Temp 98 F (36.7 C)   Resp 18   Ht 5' 3"$  (1.6 m)   Wt 166 lb 8 oz (75.5 kg)   SpO2 97%   BMI 29.49 kg/m    Physical Examination Physical Exam Constitutional:      Appearance: Normal appearance.  Cardiovascular:     Rate and Rhythm: Normal rate and regular rhythm.     Heart sounds: Murmur heard.  Pulmonary:     Effort: Pulmonary effort is normal.     Breath sounds: Normal breath sounds.  Abdominal:     General: Bowel sounds are normal.     Palpations: Abdomen is soft.  Neurological:     Mental Status: She is alert.        Assessment & Plan:   Acquired hypothyroidism I will do TSH level today  CKD stage 4 due to type 2 diabetes mellitus ([place]) She is on insulin and I will do hemoglobin A1c.  Tachycardia Her blood pressure was low when she was in the hospital with sepsis and she was started on  midodrine.  Her blood pressure is better and she has a tachycardia.  I will stop midodrine and we will monitor her blood pressure.    Return in about 1 month (around 05/08/2022).   Garwin Brothers, MD

## 2022-04-09 NOTE — Assessment & Plan Note (Signed)
She is on insulin and I will do hemoglobin A1c.

## 2022-04-23 ENCOUNTER — Ambulatory Visit: Payer: Medicare Other

## 2022-04-25 ENCOUNTER — Ambulatory Visit: Payer: Medicare Other

## 2022-04-26 LAB — CMP14 + ANION GAP
ALT: 8 IU/L (ref 0–32)
AST: 18 IU/L (ref 0–40)
Albumin/Globulin Ratio: 2 (ref 1.2–2.2)
Albumin: 4.4 g/dL (ref 3.7–4.7)
Alkaline Phosphatase: 88 IU/L (ref 44–121)
Anion Gap: 15 mmol/L (ref 10.0–18.0)
BUN/Creatinine Ratio: 27 (ref 12–28)
BUN: 45 mg/dL — ABNORMAL HIGH (ref 8–27)
Bilirubin Total: 0.5 mg/dL (ref 0.0–1.2)
CO2: 20 mmol/L (ref 20–29)
Calcium: 10.5 mg/dL — ABNORMAL HIGH (ref 8.7–10.3)
Chloride: 103 mmol/L (ref 96–106)
Creatinine, Ser: 1.68 mg/dL — ABNORMAL HIGH (ref 0.57–1.00)
Globulin, Total: 2.2 g/dL (ref 1.5–4.5)
Glucose: 214 mg/dL — ABNORMAL HIGH (ref 70–99)
Potassium: 5 mmol/L (ref 3.5–5.2)
Sodium: 138 mmol/L (ref 134–144)
Total Protein: 6.6 g/dL (ref 6.0–8.5)
eGFR: 29 mL/min/{1.73_m2} — ABNORMAL LOW (ref >59)

## 2022-04-26 LAB — HEMOGLOBIN A1C
Est. average glucose Bld gHb Est-mCnc: 160 mg/dL
Hgb A1c MFr Bld: 7.2 % — ABNORMAL HIGH (ref 4.8–5.6)

## 2022-04-26 LAB — LIPID PANEL
Chol/HDL Ratio: 3.9 ratio (ref 0.0–4.4)
Cholesterol, Total: 104 mg/dL (ref 100–199)
HDL: 27 mg/dL — ABNORMAL LOW (ref >39)
LDL Chol Calc (NIH): 54 mg/dL (ref 0–99)
Triglycerides: 128 mg/dL (ref 0–149)
VLDL Cholesterol Cal: 23 mg/dL (ref 5–40)

## 2022-04-26 LAB — TSH: TSH: 1.12 u[IU]/mL (ref 0.450–4.500)

## 2022-05-28 ENCOUNTER — Ambulatory Visit: Payer: Medicare Other | Admitting: Internal Medicine

## 2022-05-28 ENCOUNTER — Encounter: Payer: Self-pay | Admitting: Internal Medicine

## 2022-05-28 VITALS — BP 144/82 | HR 115 | Temp 97.7°F | Resp 18 | Ht 62.0 in | Wt 165.1 lb

## 2022-05-28 DIAGNOSIS — N1831 Chronic kidney disease, stage 3a: Secondary | ICD-10-CM | POA: Diagnosis not present

## 2022-05-28 DIAGNOSIS — Z794 Long term (current) use of insulin: Secondary | ICD-10-CM

## 2022-05-28 DIAGNOSIS — I10 Essential (primary) hypertension: Secondary | ICD-10-CM

## 2022-05-28 DIAGNOSIS — E039 Hypothyroidism, unspecified: Secondary | ICD-10-CM

## 2022-05-28 DIAGNOSIS — E1122 Type 2 diabetes mellitus with diabetic chronic kidney disease: Secondary | ICD-10-CM

## 2022-05-28 MED ORDER — METOPROLOL SUCCINATE ER 25 MG PO TB24: 12.5000 mg | ORAL_TABLET | Freq: Every day | ORAL | 6 refills | Status: DC

## 2022-05-28 MED ORDER — SITAGLIPTIN PHOSPHATE 50 MG PO TABS: 50.0000 mg | ORAL_TABLET | Freq: Every day | ORAL | 6 refills | Status: DC

## 2022-05-28 NOTE — Assessment & Plan Note (Signed)
Her CKD is better now CKD3a with HbA1c of 6.4%. I will stop insulin and will start januvia 100 mg daily.

## 2022-05-28 NOTE — Assessment & Plan Note (Signed)
He is here for repeat TSH today

## 2022-05-28 NOTE — Progress Notes (Signed)
Office Visit  Subjective   Patient ID: [name] [name]   DOB: 02/20/1932   Age: 89 y.o.   MRN: [id number]   Chief Complaint No chief complaint on file.    History of Present Illness 89 years old female is here for follow up from [facility]. She says her wound in her left foot has completely healed. Her TSH was high so levothyroxine dose was increased from 100 to 112/m. She is here for TSH level drawn today.  She was placed on oxygen but her saturation on room air was 98 %. I have suggested to stop oxygen.  She also has diabetes and takes Humulin N 70/30 8 units daily and HbA1c was 6.4% last month. I have suggested to stop insulin to prevent hypoglycemia. I will start her on januvia 100 mg daily.  She also has hypertension and blood pressure is 144/82 and HR 115. She take amlodipine 5 mg daily. She also has CKD and is improving slowly..    Past Medical History Past Medical History:  Diagnosis Date   Anemia, unspecified    Arthritis    "knees; right shoulder" (09/15/2013)   Basal cell carcinoma    "right upper outer lip"   DM neuropathy, type II diabetes mellitus (HCC)    Dyspnea 2021   with low iron   GERD (gastroesophageal reflux disease)    Gout    High cholesterol    Hypertension    Hypothyroidism    Meniere's disease    Obesity (BMI 30-39.9)    Other forms of epilepsy and recurrent seizures without mention of intractable epilepsy 11/04/2012   Non convulsive paroxysmal spells, responding to [facility], patient not driving.    Pneumonia ~ 1943   Skin cancer    "forehead; right hand"   Stage 4 chronic kidney disease (HCC)    UTI (urinary tract infection)    Vitamin D deficiency      Allergies Allergies  Allergen Reactions   Ppd [Tuberculin Purified Protein Derivative] Other (See Comments)    indurated     Review of Systems Review of Systems  Constitutional: Negative.   HENT: Negative.    Respiratory: Negative.    Cardiovascular: Negative.   Gastrointestinal: Negative.    Neurological: Negative.        Objective:    Vitals There were no vitals taken for this visit.   Physical Examination Physical Exam Constitutional:      Appearance: Normal appearance. She is obese.  HENT:     Head: Normocephalic and atraumatic.  Cardiovascular:     Rate and Rhythm: Regular rhythm. Tachycardia present.     Heart sounds: Normal heart sounds.  Pulmonary:     Effort: Pulmonary effort is normal.     Breath sounds: Normal breath sounds.  Abdominal:     General: Bowel sounds are normal.     Palpations: Abdomen is soft.  Neurological:     General: No focal deficit present.     Mental Status: She is alert.        Assessment & Plan:   Essential hypertension Blood pressure is high and heart rate 115. I will add metoprol tartrate 12.5 mg daily  Acquired hypothyroidism He is here for repeat TSH today  Type 2 diabetes mellitus with diabetic chronic kidney disease Her CKD is better now CKD3a with HbA1c of 6.4%. I will stop insulin and will start januvia 100 mg daily.     Return in about 1 month (around 06/27/2022).   Jason Fila  Reesa Chew, MD

## 2022-05-28 NOTE — Assessment & Plan Note (Signed)
Blood pressure is high and heart rate 115. I will add metoprol tartrate 12.5 mg daily

## 2022-05-29 LAB — TSH: TSH: 1.02 u[IU]/mL (ref 0.450–4.500)

## 2022-07-02 ENCOUNTER — Ambulatory Visit: Payer: Medicare Other | Admitting: Internal Medicine

## 2022-07-02 ENCOUNTER — Encounter: Payer: Self-pay | Admitting: Internal Medicine

## 2022-07-02 VITALS — BP 124/70 | HR 97 | Temp 97.8°F | Resp 18 | Ht 62.0 in | Wt 164.0 lb

## 2022-07-02 DIAGNOSIS — N184 Chronic kidney disease, stage 4 (severe): Secondary | ICD-10-CM | POA: Diagnosis not present

## 2022-07-02 DIAGNOSIS — E1122 Type 2 diabetes mellitus with diabetic chronic kidney disease: Secondary | ICD-10-CM

## 2022-07-02 DIAGNOSIS — G2581 Restless legs syndrome: Secondary | ICD-10-CM | POA: Diagnosis not present

## 2022-07-02 DIAGNOSIS — I872 Venous insufficiency (chronic) (peripheral): Secondary | ICD-10-CM

## 2022-07-02 MED ORDER — DAPAGLIFLOZIN PROPANEDIOL 10 MG PO TABS: 10.0000 mg | ORAL_TABLET | Freq: Every day | ORAL | 6 refills | Status: DC

## 2022-07-02 MED ORDER — SITAGLIPTIN PHOSPHATE 100 MG PO TABS: 100.0000 mg | ORAL_TABLET | Freq: Every day | ORAL | 6 refills | Status: DC

## 2022-07-02 NOTE — Progress Notes (Signed)
Office Visit  Subjective   Patient ID: [name] [name]   DOB: [birth year]   Age: 89 y.o.   MRN: [id number]   Chief Complaint Chief Complaint  Patient presents with   Follow-up    Essential Hypertension.     History of Present Illness 89 years old female is here for follow up.  She says that they check her sugar four times a da even though I have stopped her insulin. Her sugar stay around 200 mg/dl range. She has seen eye doctor recently.    She also has hypertension and blood pressure is better today. She take amlodipine 5 mg daily.  She also has CKD and follows with [facility] every 4 months.   She also has restless leg syndrome and says that she has to ask for requip every night otherwise they will not give her. She can not sleep with this medicine due to restless leg syndrome.    Past Medical History Past Medical History:  Diagnosis Date   Anemia, unspecified    Arthritis    "knees; right shoulder" (09/15/2013)   Basal cell carcinoma    "right upper outer lip"   DM neuropathy, type II diabetes mellitus (HCC)    Dyspnea 2021   with low iron   GERD (gastroesophageal reflux disease)    Gout    High cholesterol    Hypertension    Hypothyroidism    Meniere's disease    Obesity (BMI 30-39.9)    Other forms of epilepsy and recurrent seizures without mention of intractable epilepsy 11/04/2012   Non convulsive paroxysmal spells, responding to [facility], patient not driving.    Pneumonia ~ 1943   Skin cancer    "forehead; right hand"   Stage 4 chronic kidney disease (HCC)    UTI (urinary tract infection)    Vitamin D deficiency      Allergies Allergies  Allergen Reactions   Ppd [Tuberculin Purified Protein Derivative] Other (See Comments)    indurated     Review of Systems Review of Systems  Constitutional: Negative.   HENT: Negative.    Respiratory: Negative.    Cardiovascular: Negative.   Gastrointestinal: Negative.   Neurological: Negative.         Objective:    Vitals BP 124/70 (BP Location: Left Arm, Patient Position: Sitting, Cuff Size: Normal)   Pulse 97   Temp 97.8 F (36.6 C)   Resp 18   Ht 5\' 2"  (1.575 m)   Wt 164 lb (74.4 kg)   SpO2 96%   BMI 30.00 kg/m    Physical Examination Physical Exam Constitutional:      Appearance: She is obese.  HENT:     Head: Normocephalic and atraumatic.  Cardiovascular:     Rate and Rhythm: Normal rate and regular rhythm.     Heart sounds: Murmur heard.  Pulmonary:     Effort: Pulmonary effort is normal.     Breath sounds: Normal breath sounds.  Abdominal:     General: Bowel sounds are normal.     Palpations: Abdomen is soft.  Neurological:     General: No focal deficit present.     Mental Status: She is alert and oriented to person, place, and time.        Assessment & Plan:   Venous (peripheral) insufficiency She is wearing elastic stocking.  Type 2 diabetes mellitus with diabetic chronic kidney disease (HCC) They will check her sugar once a day. I will add farxiga 10  mg daily and increase the dose of sitagliptan to 100 mg daily.  Restless leg syndrome She will take ropinirol 0.5 mg daily    Return in about 3 months (around 10/02/2022).   Eloisa Northern, MD

## 2022-07-02 NOTE — Assessment & Plan Note (Signed)
She is wearing elastic stocking.

## 2022-07-02 NOTE — Assessment & Plan Note (Signed)
They will check her sugar once a day. I will add farxiga 10 mg daily and increase the dose of sitagliptan to 100 mg daily.

## 2022-07-02 NOTE — Assessment & Plan Note (Signed)
She will take ropinirol 0.5 mg daily

## 2022-07-23 ENCOUNTER — Encounter (HOSPITAL_COMMUNITY): Payer: Self-pay

## 2022-07-23 ENCOUNTER — Encounter: Payer: Self-pay | Admitting: Hematology

## 2022-07-27 ENCOUNTER — Encounter: Payer: Self-pay | Admitting: Hematology

## 2022-07-27 ENCOUNTER — Encounter (HOSPITAL_COMMUNITY): Payer: Self-pay

## 2022-07-29 ENCOUNTER — Other Ambulatory Visit: Payer: Self-pay

## 2022-07-29 DIAGNOSIS — D471 Chronic myeloproliferative disease: Secondary | ICD-10-CM

## 2022-07-30 ENCOUNTER — Inpatient Hospital Stay: Payer: Medicare Other | Attending: Hematology

## 2022-07-30 ENCOUNTER — Inpatient Hospital Stay: Payer: Medicare Other | Admitting: Hematology

## 2022-07-30 ENCOUNTER — Other Ambulatory Visit: Payer: Self-pay | Admitting: *Deleted

## 2022-07-30 VITALS — BP 134/42 | HR 89 | Temp 98.1°F | Resp 16 | Ht 62.0 in | Wt 165.5 lb

## 2022-07-30 DIAGNOSIS — D649 Anemia, unspecified: Secondary | ICD-10-CM | POA: Insufficient documentation

## 2022-07-30 DIAGNOSIS — D631 Anemia in chronic kidney disease: Secondary | ICD-10-CM | POA: Diagnosis not present

## 2022-07-30 DIAGNOSIS — D471 Chronic myeloproliferative disease: Secondary | ICD-10-CM | POA: Diagnosis present

## 2022-07-30 DIAGNOSIS — N184 Chronic kidney disease, stage 4 (severe): Secondary | ICD-10-CM

## 2022-07-30 LAB — CBC WITH DIFFERENTIAL (CANCER CENTER ONLY)
Abs Immature Granulocytes: 0.02 10*3/uL (ref 0.00–0.07)
Basophils Absolute: 0 10*3/uL (ref 0.0–0.1)
Basophils Relative: 1 %
Eosinophils Absolute: 0.9 10*3/uL — ABNORMAL HIGH (ref 0.0–0.5)
Eosinophils Relative: 16 %
HCT: 40 % (ref 36.0–46.0)
Hemoglobin: 12.4 g/dL (ref 12.0–15.0)
Immature Granulocytes: 0 %
Lymphocytes Relative: 10 %
Lymphs Abs: 0.6 10*3/uL — ABNORMAL LOW (ref 0.7–4.0)
MCH: 25.4 pg — ABNORMAL LOW (ref 26.0–34.0)
MCHC: 31 g/dL (ref 30.0–36.0)
MCV: 81.8 fL (ref 80.0–100.0)
Monocytes Absolute: 0.4 10*3/uL (ref 0.1–1.0)
Monocytes Relative: 7 %
Neutro Abs: 3.5 10*3/uL (ref 1.7–7.7)
Neutrophils Relative %: 66 %
Platelet Count: 402 10*3/uL — ABNORMAL HIGH (ref 150–400)
RBC: 4.89 MIL/uL (ref 3.87–5.11)
RDW: 22.4 % — ABNORMAL HIGH (ref 11.5–15.5)
WBC Count: 5.4 10*3/uL (ref 4.0–10.5)
nRBC: 0 % (ref 0.0–0.2)

## 2022-07-30 LAB — IRON AND IRON BINDING CAPACITY (CC-WL,HP ONLY)
Iron: 43 ug/dL (ref 28–170)
Saturation Ratios: 12 % (ref 10.4–31.8)
TIBC: 349 ug/dL (ref 250–450)
UIBC: 306 ug/dL (ref 148–442)

## 2022-07-30 LAB — FERRITIN: Ferritin: 19 ng/mL (ref 11–307)

## 2022-07-30 LAB — CMP (CANCER CENTER ONLY)
ALT: 9 U/L (ref 0–44)
AST: 13 U/L — ABNORMAL LOW (ref 15–41)
Albumin: 4.6 g/dL (ref 3.5–5.0)
Alkaline Phosphatase: 67 U/L (ref 38–126)
Anion gap: 8 (ref 5–15)
BUN: 46 mg/dL — ABNORMAL HIGH (ref 8–23)
CO2: 27 mmol/L (ref 22–32)
Calcium: 10.2 mg/dL (ref 8.9–10.3)
Chloride: 105 mmol/L (ref 98–111)
Creatinine: 2.31 mg/dL — ABNORMAL HIGH (ref 0.44–1.00)
GFR, Estimated: 20 mL/min — ABNORMAL LOW (ref >60)
Glucose, Bld: 178 mg/dL — ABNORMAL HIGH (ref 70–99)
Potassium: 4.5 mmol/L (ref 3.5–5.1)
Sodium: 140 mmol/L (ref 135–145)
Total Bilirubin: 0.5 mg/dL (ref 0.3–1.2)
Total Protein: 6.9 g/dL (ref 6.5–8.1)

## 2022-07-30 LAB — LACTATE DEHYDROGENASE: LDH: 305 U/L — ABNORMAL HIGH (ref 98–192)

## 2022-07-30 NOTE — Progress Notes (Signed)
HEMATOLOGY/ONCOLOGY CLINIC NOTE  Date of Service: 07/30/22   Patient Care Team: Lucky Cowboy, MD as PCP - General (Internal Medicine) [name], MD (Inactive) as Consulting Physician (Otolaryngology) Dorena Cookey, MD (Inactive) as Consulting Physician (Gastroenterology) Drue Second, MD as Consulting Physician (Oncology) Estanislado Emms, MD as Consulting Physician (Internal Medicine) Lajoyce Corners, [facility] (Inactive) as Pharmacist (Pharmacist)  CHIEF COMPLAINTS/PURPOSE OF CONSULTATION:  Follow-up for JAK2 positive myeloproliferative neoplasm  HISTORY OF PRESENTING ILLNESS:  Please see previous note for details on initial presentation  INTERVAL HISTORY:   [name] [name] is a 89 y.o. female here for follow-up of her JAK2 positive myeloproliferative neoplasm. Patient is here with a nurse.   Patient was last seen by me on 01/29/2022 and she complained of shortness of breath and bilateral leg swelling.   Patient notes she has been doing well overall without any new or severe medical concerns since our last visit. She notes that her breathing and overall health has improved after her rehab. She is not using oxygen tank at home anymore. Patient also notes that she is able to walk further than usual.   She currently lives at an assisted living facility.   She had ingrown nail infection on her right hand, which was resolved with antibiotics. She follows up with her PCP regarding this infection.   Patient complains of skin lesion on her left shoulder and mild skin lesion underneath her left eye. She has an upcoming appointment with her dermatologist in July for her skin lesions.   She is complaint with all of her medications. However, she has discontinued insulin and has started Januvia 100 mg and Farxiga 10 mg.   She denies fever, chills, night sweats, unexpected weight loss, abdominal pain, chest pain, back pain, or leg swelling.   MEDICAL HISTORY:  Past Medical  History:  Diagnosis Date   Anemia, unspecified    Arthritis    "knees; right shoulder" (09/15/2013)   Basal cell carcinoma    "right upper outer lip"   DM neuropathy, type II diabetes mellitus (HCC)    Dyspnea 2021   with low iron   GERD (gastroesophageal reflux disease)    Gout    High cholesterol    Hypertension    Hypothyroidism    Meniere's disease    Obesity (BMI 30-39.9)    Other forms of epilepsy and recurrent seizures without mention of intractable epilepsy 11/04/2012   Non convulsive paroxysmal spells, responding to [facility], patient not driving.    Pneumonia ~ 1943   Skin cancer    "forehead; right hand"   Stage 4 chronic kidney disease (HCC)    UTI (urinary tract infection)    Vitamin D deficiency     SURGICAL HISTORY: Past Surgical History:  Procedure Laterality Date   BIOPSY  04/05/2020   Procedure: BIOPSY;  Surgeon: Beverley Fiedler, MD;  Location: [facility];  Service: Gastroenterology;;   CERVICAL POLYPECTOMY     COLONOSCOPY WITH PROPOFOL N/A 04/06/2020   Procedure: COLONOSCOPY WITH PROPOFOL;  Surgeon: Beverley Fiedler, MD;  Location: [facility];  Service: Gastroenterology;  Laterality: N/A;   ESOPHAGOGASTRODUODENOSCOPY (EGD) WITH PROPOFOL N/A 04/05/2020   Procedure: ESOPHAGOGASTRODUODENOSCOPY (EGD) WITH PROPOFOL;  Surgeon: Beverley Fiedler, MD;  Location: [facility];  Service: Gastroenterology;  Laterality: N/A;   HAMMER TOE SURGERY Left 1980's   IR FLUORO GUIDE CV LINE RIGHT  01/09/2022   IR PERC TUN PERIT CATH WO PORT S&I /IMAG  01/09/2022   IR US  GUIDE VASC ACCESS RIGHT  01/09/2022   MOHS SURGERY  20087   "right upper outer lip"   POLYPECTOMY  04/06/2020   Procedure: POLYPECTOMY;  Surgeon: Beverley Fiedler, MD;  Location: [facility];  Service: Gastroenterology;;   TONSILLECTOMY  ~ 1947   WRIST SURGERY Left ~ 1950   "ran arm thru window"    SOCIAL HISTORY: Social History   Socioeconomic History   Marital status: Widowed    Spouse name: Not on file    Number of children: 3   Years of education: 42   Highest education level: Not on file  Occupational History   Occupation: retired  Tobacco Use   Smoking status: Former    Packs/day: 1.00    Years: 30.00    Total pack years: 30.00    Types: Cigarettes    Quit date: 06/29/1983    Years since quitting: 38.6   Smokeless tobacco: Never  Vaping Use   Vaping Use: Never used  Substance and Sexual Activity   Alcohol use: Yes    Alcohol/week: 0.0 standard drinks of alcohol    Comment: 09/15/2013 "glass of wine maybe once/year"   Drug use: No   Sexual activity: Not Currently  Other Topics Concern   Not on file  Social History Narrative   Patient lives at home alone and she is widowed.   Retired.   Education some college.   Right handed.   Caffeine sometimes not daily.    Social Determinants of Health   Financial Resource Strain: Not on file  Food Insecurity: No Food Insecurity (01/03/2022)   Hunger Vital Sign    Worried About Running Out of Food in the Last Year: Never true    Ran Out of Food in the Last Year: Never true  Transportation Needs: No Transportation Needs (01/03/2022)   PRAPARE - Administrator, Civil Service (Medical): No    Lack of Transportation (Non-Medical): No  Physical Activity: Not on file  Stress: Not on file  Social Connections: Not on file  Intimate Partner Violence: Not At Risk (01/03/2022)   Humiliation, Afraid, Rape, and Kick questionnaire    Fear of Current or Ex-Partner: No    Emotionally Abused: No    Physically Abused: No    Sexually Abused: No    FAMILY HISTORY: Family History  Problem Relation Age of Onset   Heart disease Mother    Stroke Mother    Heart disease Father    Ovarian cancer Sister    Hypertension Son    Hypertension Son    Diabetes Son    Alcohol abuse Son     ALLERGIES:  is allergic to ppd [tuberculin purified protein derivative].  MEDICATIONS:  Current Outpatient Medications  Medication Sig Dispense  Refill   acetaminophen (TYLENOL) 500 MG tablet Take 1,000 mg by mouth every 6 (six) hours as needed for moderate pain.     allopurinol (ZYLOPRIM) 100 MG tablet Take 1 tablet (100 mg total) by mouth 2 (two) times daily. Take  1 tablet  Daily  to prevent Gout. 180 tablet 2   aspirin 81 MG tablet Take 81 mg by mouth daily.     BD VEO INSULIN SYRINGE U/F 31G X 15/64" 0.3 ML MISC 2 (two) times daily.     Blood Glucose Monitoring Suppl (ONETOUCH VERIO) w/Device KIT Check  Blood Sugar   4 x /day before Meals & Bedtime   (( Dx-  e11.22 )) 1 kit 1   cefTRIAXone (ROCEPHIN)  IVPB Inject 2 g into the vein daily. Indication:  GBS bacteremia - empiric IE treatment  First Dose: Yes Last Day of Therapy:  02/15/2022 Labs - Once weekly:  CBC/D and BMP, Labs - Every other week:  ESR and CRP Method of administration: IV Push Refer to interventional radiology for tunneled catheter removal at the completion of IV therapy  Method of administration may be changed at the discretion of home infusion pharmacist based upon assessment of the patient and/or caregiver's ability to self-administer the medication ordered. 38 Units 0   cholecalciferol (VITAMIN D3) 25 MCG (1000 UNIT) tablet Take 3,000 Units by mouth daily.     fish oil-omega-3 fatty acids 1000 MG capsule Take 1,000 mg by mouth at bedtime.     glucose blood (ONETOUCH VERIO) test strip Check Blood Sugar 4 x /day  (( Dx-  e11.22 )) 300 each 3   hydrocortisone (ANUSOL-HC) 2.5 % rectal cream Place rectally 2 (two) times daily. 30 g 0   insulin NPH-regular Human (HUMULIN 70/30) (70-30) 100 UNIT/ML injection Inject 8 Units into the skin daily with breakfast. 30 mL 3   Lancets (ONETOUCH ULTRASOFT) lancets Check Blood Sugar  4 x /day  before Meals & Bedtime   (( Dx-  e11.22 )) 400 each 3   lansoprazole (PREVACID) 30 MG capsule TAKE 1 CAPSULE BY MOUTH DAILY TO PREVENT HEARTBURN AND INDIGESTION. (Patient taking differently: Take 30 mg by mouth daily.) 90 capsule 3    levETIRAcetam (KEPPRA) 500 MG tablet Take  1 tablet 2  x /day  with Meals  to Prevent Seizures (Patient taking differently: 250 mg in the morning and 500mg  in the evening) 180 tablet 3   levothyroxine (SYNTHROID) 100 MCG tablet TAKE 1 TABLET BY MOUTH DAILY ON AN EMPTY STOMACH WITH ONLY WATER FOR 30 MINUTES. NO ANTACIDS, CALCIUM OR MAGNESIUM FOR 4 HOURS. AVOID BIOTIN (Patient taking differently: Take 100 mcg by mouth daily before breakfast.) 90 tablet 3   loratadine (CLARITIN) 10 MG tablet Take 10 mg by mouth daily.     meclizine (ANTIVERT) 25 MG tablet Take   1 tablet 2 to 3 x /day  ONLY  if needed for Dizziness /Vertigo 90 tablet 1   midodrine (PROAMATINE) 10 MG tablet Take 1 tablet (10 mg total) by mouth 3 (three) times daily with meals.     rOPINIRole (REQUIP) 1 MG tablet TAKE ONE-HALF TO ONE TABLET BY MOUTH THREE TIMES DAILY AS NEEDED FOR RESTLESS LEGS& CRAMPS 270 tablet 3   vitamin B-12 (CYANOCOBALAMIN) 100 MCG tablet Take 500 mcg by mouth daily.     vitamin C (ASCORBIC ACID) 500 MG tablet Take 500 mg by mouth daily.     No current facility-administered medications for this visit.    REVIEW OF SYSTEMS:   .10 Point review of Systems was done is negative except as noted above.  PHYSICAL EXAMINATION: ECOG PERFORMANCE STATUS: 2 - Symptomatic, <50% confined to bed  Vitals:   01/29/22 1153  BP: 134/70  Pulse: 95  Resp: 17  Temp: 97.8 F (36.6 C)  SpO2: 99%   .Body mass index is 33.27 kg/m.  NAD GENERAL:alert, in no acute distress and comfortable SKIN: no acute rashes, no significant lesions EYES: conjunctiva are pink and non-injected, sclera anicteric NECK: supple, no JVD LYMPH:  no palpable lymphadenopathy in the cervical, axillary or inguinal regions LUNGS: clear to auscultation b/l with normal respiratory effort HEART: regular rate & rhythm ABDOMEN:  normoactive bowel sounds , non tender, not distended.  No palpable hepato-splenomegaly. Extremity: no pedal edema PSYCH: alert &  oriented x 3 with fluent speech NEURO: no focal motor/sensory deficits  LABORATORY DATA:  I have reviewed the data as listed  .    Latest Ref Rng & Units 07/30/2022    1:34 PM 01/29/2022   11:07 AM 01/10/2022    5:10 AM  CBC  WBC 4.0 - 10.5 K/uL 5.4  6.6  6.1   Hemoglobin 12.0 - 15.0 g/dL 16.1  9.6  8.1   Hematocrit 36.0 - 46.0 % 40.0  33.9  29.9   Platelets 150 - 400 K/uL 402  307  321    . CBC    Component Value Date/Time   WBC 5.4 07/30/2022 1334   WBC 6.1 01/10/2022 0510   RBC 4.89 07/30/2022 1334   HGB 12.4 07/30/2022 1334   HGB 11.5 (L) 10/05/2012 1111   HCT 40.0 07/30/2022 1334   HCT 34.2 (L) 10/05/2012 1111   PLT 402 (H) 07/30/2022 1334   PLT 258 10/05/2012 1111   MCV 81.8 07/30/2022 1334   MCV 90.1 10/05/2012 1111   MCH 25.4 (L) 07/30/2022 1334   MCHC 31.0 07/30/2022 1334   RDW 22.4 (H) 07/30/2022 1334   RDW 14.2 10/05/2012 1111   LYMPHSABS 0.6 (L) 07/30/2022 1334   LYMPHSABS 1.4 10/05/2012 1111   MONOABS 0.4 07/30/2022 1334   MONOABS 0.5 10/05/2012 1111   EOSABS 0.9 (H) 07/30/2022 1334   EOSABS 0.6 (H) 10/05/2012 1111   BASOSABS 0.0 07/30/2022 1334   BASOSABS 0.0 10/05/2012 1111    .    Latest Ref Rng & Units 07/30/2022    1:34 PM 04/25/2022    3:24 PM 01/29/2022   11:07 AM  CMP  Glucose 70 - 99 mg/dL 096  045  409   BUN 8 - 23 mg/dL 46  45  25   Creatinine 0.44 - 1.00 mg/dL 8.11  9.14  7.82   Sodium 135 - 145 mmol/L 140  138  140   Potassium 3.5 - 5.1 mmol/L 4.5  5.0  4.6   Chloride 98 - 111 mmol/L 105  103  113   CO2 22 - 32 mmol/L 27  20  22    Calcium 8.9 - 10.3 mg/dL 95.6  21.3  08.6   Total Protein 6.5 - 8.1 g/dL 6.9  6.6  6.3   Total Bilirubin 0.3 - 1.2 mg/dL 0.5  0.5  0.3   Alkaline Phos 38 - 126 U/L 67  88  68   AST 15 - 41 U/L 13  18  12    ALT 0 - 44 U/L 9  8  7     . Lab Results  Component Value Date   IRON 43 07/30/2022   TIBC 349 07/30/2022   IRONPCTSAT 12 07/30/2022   (Iron and TIBC)  Lab Results  Component Value Date    FERRITIN 19 07/30/2022   11/26/2019   08/10/2019 Iliac Crest Bone Marrow Report [id number]):   08/10/2019 Flow Pathology [id number]):   08/10/2019 Cytogenetics:    RADIOGRAPHIC STUDIES: I have personally reviewed the radiological images as listed and agreed with the findings in the report. IR US Guide Vasc Access Right  Result Date: 01/18/2022 INDICATION: 89 year old with bacteremia and needs long-term IV antibiotics. EXAM: FLUOROSCOPIC AND ULTRASOUND GUIDED PLACEMENT OF A TUNNELED CENTRAL VENOUS CATHETER Physician: Rachelle Hora. Lowella Dandy, MD FLUOROSCOPY TIME:  Radiation Exposure Index (as provided by the fluoroscopic device): 3 mGy Kerma MEDICATIONS: 1% lidocaine for local anesthetic ANESTHESIA/SEDATION:  None PROCEDURE: The procedure was explained to the patient. The risks and benefits of the procedure were discussed and the patient's questions were addressed. Informed consent was obtained from the patient. The patient was placed supine on the interventional table. Ultrasound confirmed a patent right internal jugularvein. Ultrasound images were obtained for documentation. The right neck and chest was prepped and draped in a sterile fashion. The right neck was anesthetized with 1% lidocaine. Maximal barrier sterile technique was utilized including caps, mask, sterile gowns, sterile gloves, sterile drape, hand hygiene and skin antiseptic. A small incision was made with #11 blade scalpel. A 21 gauge needle directed into the right internal jugular vein with ultrasound guidance. A micropuncture dilator set was placed. A single Powerline catheter was selected. The skin below the right clavicle was anesthetized and a small incision was made with an #11 blade scalpel. A subcutaneous tunnel was formed to the vein dermatotomy site. The catheter was brought through the tunnel. The vein dermatotomy site was dilated to accommodate a peel-away sheath. The catheter was placed through the peel-away sheath and  directed into the central venous structures. The tip of the catheter was placed at the superior cavoatrial junction with fluoroscopy. Fluoroscopic images were obtained for documentation. Lumen aspirated and flushed well. Lumen was flushed with heparinized saline. The vein dermatotomy site was closed using Dermabond. The catheter was secured to the skin using Prolene suture. FINDINGS: Catheter tip at the superior cavoatrial junction. Catheter length = 23 cm COMPLICATIONS: None IMPRESSION: Successful placement of a right jugular tunneled central venous catheter using ultrasound and fluoroscopic guidance. Electronically Signed   By: Richarda Overlie M.D.   On: 01/18/2022 09:26   IR Fluoro Guide CV Line Right  Result Date: 01/11/2022 INDICATION: 89 year old with bacteremia and needs long-term IV antibiotics. EXAM: FLUOROSCOPIC AND ULTRASOUND GUIDED PLACEMENT OF A TUNNELED CENTRAL VENOUS CATHETER Physician: Rachelle Hora. Lowella Dandy, MD FLUOROSCOPY TIME:  Radiation Exposure Index (as provided by the fluoroscopic device): 3 mGy Kerma MEDICATIONS: 1% lidocaine for local anesthetic ANESTHESIA/SEDATION: None PROCEDURE: The procedure was explained to the patient. The risks and benefits of the procedure were discussed and the patient's questions were addressed. Informed consent was obtained from the patient. The patient was placed supine on the interventional table. Ultrasound confirmed a patent right internal jugularvein. Ultrasound images were obtained for documentation. The right neck and chest was prepped and draped in a sterile fashion. The right neck was anesthetized with 1% lidocaine. Maximal barrier sterile technique was utilized including caps, mask, sterile gowns, sterile gloves, sterile drape, hand hygiene and skin antiseptic. A small incision was made with #11 blade scalpel. A 21 gauge needle directed into the right internal jugular vein with ultrasound guidance. A micropuncture dilator set was placed. A single Powerline  catheter was selected. The skin below the right clavicle was anesthetized and a small incision was made with an #11 blade scalpel. A subcutaneous tunnel was formed to the vein dermatotomy site. The catheter was brought through the tunnel. The vein dermatotomy site was dilated to accommodate a peel-away sheath. The catheter was placed through the peel-away sheath and directed into the central venous structures. The tip of the catheter was placed at the superior cavoatrial junction with fluoroscopy. Fluoroscopic images were obtained for documentation. Lumen aspirated and flushed well. Lumen was flushed with heparinized saline. The vein dermatotomy site was closed using Dermabond. The catheter was secured to the skin using Prolene suture. FINDINGS: Catheter tip at the superior cavoatrial junction. Catheter length = 23 cm  COMPLICATIONS: None IMPRESSION: Successful placement of a right jugular tunneled central venous catheter using ultrasound and fluoroscopic guidance. Electronically Signed   By: Richarda Overlie M.D.   On: 01/11/2022 17:45   IR TUNNELED CENTRAL VENOUS CATHETER PLACEMENT  Result Date: 01/11/2022 INDICATION: 89 year old with bacteremia and needs long-term IV antibiotics. EXAM: FLUOROSCOPIC AND ULTRASOUND GUIDED PLACEMENT OF A TUNNELED CENTRAL VENOUS CATHETER Physician: Rachelle Hora. Lowella Dandy, MD FLUOROSCOPY TIME:  Radiation Exposure Index (as provided by the fluoroscopic device): 3 mGy Kerma MEDICATIONS: 1% lidocaine for local anesthetic ANESTHESIA/SEDATION: None PROCEDURE: The procedure was explained to the patient. The risks and benefits of the procedure were discussed and the patient's questions were addressed. Informed consent was obtained from the patient. The patient was placed supine on the interventional table. Ultrasound confirmed a patent right internal jugularvein. Ultrasound images were obtained for documentation. The right neck and chest was prepped and draped in a sterile fashion. The right neck was  anesthetized with 1% lidocaine. Maximal barrier sterile technique was utilized including caps, mask, sterile gowns, sterile gloves, sterile drape, hand hygiene and skin antiseptic. A small incision was made with #11 blade scalpel. A 21 gauge needle directed into the right internal jugular vein with ultrasound guidance. A micropuncture dilator set was placed. A single Powerline catheter was selected. The skin below the right clavicle was anesthetized and a small incision was made with an #11 blade scalpel. A subcutaneous tunnel was formed to the vein dermatotomy site. The catheter was brought through the tunnel. The vein dermatotomy site was dilated to accommodate a peel-away sheath. The catheter was placed through the peel-away sheath and directed into the central venous structures. The tip of the catheter was placed at the superior cavoatrial junction with fluoroscopy. Fluoroscopic images were obtained for documentation. Lumen aspirated and flushed well. Lumen was flushed with heparinized saline. The vein dermatotomy site was closed using Dermabond. The catheter was secured to the skin using Prolene suture. FINDINGS: Catheter tip at the superior cavoatrial junction. Catheter length = 23 cm COMPLICATIONS: None IMPRESSION: Successful placement of a right jugular tunneled central venous catheter using ultrasound and fluoroscopic guidance. Electronically Signed   By: Richarda Overlie M.D.   On: 01/11/2022 17:45   [place] EKG SITE RITE  Result Date: 01/08/2022 If Site Rite image not attached, placement could not be confirmed due to current cardiac rhythm.  CT ABDOMEN PELVIS WO CONTRAST  Result Date: 01/04/2022 CLINICAL DATA:  Left upper quadrant pain EXAM: CT ABDOMEN AND PELVIS WITHOUT CONTRAST TECHNIQUE: Multidetector CT imaging of the abdomen and pelvis was performed following the standard protocol without IV contrast. RADIATION DOSE REDUCTION: This exam was performed according to the departmental dose-optimization  program which includes automated exposure control, adjustment of the mA and/or kV according to patient size and/or use of iterative reconstruction technique. COMPARISON:  None Available. FINDINGS: Lower chest: Small to moderate bilateral pleural effusions. Compressive atelectasis in the lower lobes. Small hiatal hernia. Hepatobiliary: Gallbladder is contracted. There appears to be gallbladder wall thickening. No visible ductal dilatation or focal hepatic abnormality. Pancreas: No focal abnormality or ductal dilatation. Spleen: Splenomegaly with a craniocaudal length of 17 cm. Adrenals/Urinary Tract: Bilateral cortical thinning. No stones or hydronephrosis. Adrenal glands unremarkable. Urinary bladder decompressed with Foley catheter in place. Stomach/Bowel: Moderate stool burden. Stomach, large and small bowel grossly unremarkable. Vascular/Lymphatic: Aortic atherosclerosis. No evidence of aneurysm or adenopathy. Reproductive: Uterus and adnexa unremarkable.  No mass. Other: No free fluid or free air. Musculoskeletal: No acute bony abnormality. IMPRESSION: Small  to moderate bilateral pleural effusions. Compressive atelectasis in the lower lobes. Small hiatal hernia. Splenomegaly. Gallbladder is contracted. There appears to be gallbladder wall thickening. This could be further evaluated with right upper quadrant ultrasound if felt clinically indicated. Aortic atherosclerosis. Electronically Signed   By: Charlett Nose M.D.   On: 01/04/2022 21:15   ECHOCARDIOGRAM COMPLETE  Result Date: 01/04/2022    ECHOCARDIOGRAM REPORT   Patient Name:   NATELIE FELDER Date of Exam: 01/04/2022 Medical Rec #:  [id number]        Height:       62.0 in Accession #:    [id number]       Weight:       180.1 lb Date of Birth:  Dec 21, 1932       BSA:          1.828 m Patient Age:    89 years         BP:           123/33 mmHg Patient Gender: F                HR:           62 bpm. Exam Location:  Inpatient Procedure: 2D Echo, Cardiac  Doppler and Color Doppler Indications:    Bacteremia  History:        Patient has prior history of Echocardiogram examinations, most                 recent 10/14/2021. CHF, Aortic Valve Disease,                 Signs/Symptoms:Bacteremia; Risk Factors:Diabetes, Sleep Apnea                 and Dyslipidemia. Aortic stenosis.  Sonographer:    Sheralyn Boatman RDCS Referring Phys: [id number] [facility]  Sonographer Comments: Patient is obese. Image acquisition challenging due to patient body habitus. RN stated central line is present IMPRESSIONS  1. RV is now moderately dilated with moderately reduced function. Severe pHTN is present. Aortic stenosis is moderate to severe. Central catheter seen in RA. No obvious vegetation. TEE recommended if clinically inidicated.  2. Left ventricular ejection fraction, by estimation, is 60 to 65%. The left ventricle has normal function. The left ventricle has no regional wall motion abnormalities. There is mild concentric left ventricular hypertrophy. Left ventricular diastolic parameters are consistent with Grade II diastolic dysfunction (pseudonormalization). The E/e' is 32.4. There is the interventricular septum is flattened in systole, consistent with right ventricular pressure overload.  3. Right ventricular systolic function is moderately reduced. The right ventricular size is moderately enlarged. There is severely elevated pulmonary artery systolic pressure. The estimated right ventricular systolic pressure is 98.5 mmHg.  4. Right atrial size was mild to moderately dilated.  5. The mitral valve is grossly normal. Trivial mitral valve regurgitation. No evidence of mitral stenosis.  6. Tricuspid valve regurgitation is mild to moderate.  7. The aortic valve is tricuspid. Aortic valve regurgitation is not visualized. Moderate to severe aortic valve stenosis. Aortic valve area, by VTI measures 1.02 cm. Aortic valve mean gradient measures 36.0 mmHg. Aortic valve Vmax measures 3.89 m/s.  8.  The inferior vena cava is dilated in size with <50% respiratory variability, suggesting right atrial pressure of 15 mmHg. Comparison(s): Changes from prior study are noted. FINDINGS  Left Ventricle: Left ventricular ejection fraction, by estimation, is 60 to 65%. The left ventricle has normal function. The left ventricle has no  regional wall motion abnormalities. The left ventricular internal cavity size was normal in size. There is  mild concentric left ventricular hypertrophy. The interventricular septum is flattened in systole, consistent with right ventricular pressure overload. Left ventricular diastolic parameters are consistent with Grade II diastolic dysfunction (pseudonormalization). The E/e' is 32.4. Right Ventricle: The right ventricular size is moderately enlarged. No increase in right ventricular wall thickness. Right ventricular systolic function is moderately reduced. There is severely elevated pulmonary artery systolic pressure. The tricuspid regurgitant velocity is 4.57 m/s, and with an assumed right atrial pressure of 15 mmHg, the estimated right ventricular systolic pressure is 98.5 mmHg. Left Atrium: Left atrial size was normal in size. Right Atrium: Right atrial size was mild to moderately dilated. Pericardium: Trivial pericardial effusion is present. Mitral Valve: The mitral valve is grossly normal. Trivial mitral valve regurgitation. No evidence of mitral valve stenosis. MV peak gradient, 10.4 mmHg. The mean mitral valve gradient is 2.5 mmHg. Tricuspid Valve: The tricuspid valve is grossly normal. Tricuspid valve regurgitation is mild to moderate. No evidence of tricuspid stenosis. Aortic Valve: The aortic valve is tricuspid. Aortic valve regurgitation is not visualized. Moderate to severe aortic stenosis is present. Aortic valve mean gradient measures 36.0 mmHg. Aortic valve peak gradient measures 60.5 mmHg. Aortic valve area, by VTI measures 1.02 cm. Pulmonic Valve: The pulmonic valve was  grossly normal. Pulmonic valve regurgitation is trivial. No evidence of pulmonic stenosis. Aorta: The aortic root and ascending aorta are structurally normal, with no evidence of dilitation. Venous: The inferior vena cava is dilated in size with less than 50% respiratory variability, suggesting right atrial pressure of 15 mmHg. IAS/Shunts: The atrial septum is grossly normal. Additional Comments: A venous catheter is visualized in the right atrium.  LEFT VENTRICLE PLAX 2D LVIDd:         4.10 cm     Diastology LVIDs:         2.53 cm     LV e' medial:    4.50 cm/s LV PW:         1.30 cm     LV E/e' medial:  32.4 LV IVS:        1.13 cm     LV e' lateral:   3.45 cm/s LVOT diam:     2.13 cm     LV E/e' lateral: 42.3 LV SV:         95 LV SV Index:   52 LVOT Area:     3.56 cm  LV Volumes (MOD) LV vol d, MOD A2C: 55.1 ml LV vol d, MOD A4C: 50.7 ml LV vol s, MOD A2C: 23.0 ml LV vol s, MOD A4C: 16.5 ml LV SV MOD A2C:     32.1 ml LV SV MOD A4C:     50.7 ml LV SV MOD BP:      35.5 ml RIGHT VENTRICLE            IVC RV S prime:     8.03 cm/s  IVC diam: 2.30 cm TAPSE (M-mode): 1.7 cm LEFT ATRIUM             Index        RIGHT ATRIUM           Index LA diam:        4.30 cm 2.35 cm/m   RA Area:     16.90 cm LA Vol (A2C):   29.8 ml 16.30 ml/m  RA Volume:   46.70 ml  25.54 ml/m  LA Vol (A4C):   52.5 ml 28.71 ml/m LA Biplane Vol: 41.8 ml 22.86 ml/m  AORTIC VALVE AV Area (Vmax):    0.99 cm AV Area (Vmean):   0.94 cm AV Area (VTI):     1.02 cm AV Vmax:           389.00 cm/s AV Vmean:          282.000 cm/s AV VTI:            0.940 m AV Peak Grad:      60.5 mmHg AV Mean Grad:      36.0 mmHg LVOT Vmax:         108.00 cm/s LVOT Vmean:        74.200 cm/s LVOT VTI:          0.268 m LVOT/AV VTI ratio: 0.29  AORTA Ao Root diam: 2.55 cm Ao Asc diam:  2.80 cm MITRAL VALVE                TRICUSPID VALVE MV Area (PHT): 4.68 cm     TR Peak grad:   83.5 mmHg MV Peak grad:  10.4 mmHg    TR Vmax:        457.00 cm/s MV Mean grad:  2.5 mmHg MV  Vmax:       1.62 m/s     SHUNTS MV Vmean:      73.8 cm/s    Systemic VTI:  0.27 m MV Decel Time: 162 msec     Systemic Diam: 2.13 cm MV E velocity: 146.00 cm/s MV A velocity: 106.00 cm/s MV E/A ratio:  1.38 Lennie Odor MD Electronically signed by Lennie Odor MD Signature Date/Time: 01/04/2022/10:43:29 AM    Final    DG Chest Port 1 View  Result Date: 01/03/2022 CLINICAL DATA:  [id number]. Encounter for central line placement. History of hypertension. EXAM: PORTABLE CHEST 1 VIEW COMPARISON:  Chest CT without contrast yesterday at 11:23 p.m. FINDINGS: 7:40 p.m. There is cardiomegaly and mild central vascular prominence. Vascular asymmetry of the right hilum. Tortuosity and calcification of the aorta noted with otherwise unremarkable mediastinum. There is no overt pulmonary edema. There are small pleural effusions with hazy atelectasis or infiltrate in the base of both lungs. The lungs are otherwise generally clear. There is overlying monitor wiring. Newly noted right IJ central line in place, the tip in the upper right atrium. No pneumothorax. Thoracic spondylosis. IMPRESSION: 1. Right IJ central line tip in the upper right atrium. No pneumothorax. 2. Cardiomegaly with mild central vascular prominence. 3. Small pleural effusions with hazy atelectasis or infiltrate in the base of both lungs. 4. Aortic atherosclerosis. Electronically Signed   By: Almira Bar M.D.   On: 01/03/2022 20:10   [place] EKG SITE RITE  Result Date: 01/03/2022 If Site Rite image not attached, placement could not be confirmed due to current cardiac rhythm.  CT CHEST WO CONTRAST  Result Date: 01/03/2022 CLINICAL DATA:  Fall injury at home with hypoxia on presentation. Shortness of breath. EXAM: CT CHEST WITHOUT CONTRAST TECHNIQUE: Multidetector CT imaging of the chest was performed following the standard protocol without IV contrast. RADIATION DOSE REDUCTION: This exam was performed according to the departmental dose-optimization  program which includes automated exposure control, adjustment of the mA and/or kV according to patient size and/or use of iterative reconstruction technique. COMPARISON:  The portable chest earlier today, chest radiograph 10/16/2021, and chest CT without contrast of 09/18/2013. FINDINGS: Cardiovascular: There is mild cardiomegaly and a small  pericardial effusion both increased since 2015. The pulmonary arteries are upper-normal in caliber. The pulmonary veins are decompressed. Calcifications are noted in the aortic valve leaflets, with moderate patchy calcification in the aortic wall, scattered calcification in the great vessels. There is no aortic aneurysm. Mediastinum/Nodes: Small hiatal hernia. No esophageal thickening is seen or tracheal filling defects. Thyroid gland both axillary spaces are unremarkable. There are mildly prominent lymph nodes in the upper left hilar region and in the precarinal space to the right, both measuring up to 1.1 cm in short axis. These were present previously. Hilar adenopathy is difficult to evaluate without contrast but no other hilar adenopathy is definitively noted. Azygoesophageal recess lymph nodes have increased in prominence now 1.3 cm in short axis, previously 8 mm. There is no other visible adenopathy without contrast. Lungs/Pleura: There are small bilateral layering pleural effusions with slightly greater fluid on the right. There is adjacent compressive atelectasis. There is bronchial thickening in both lower lobes with scattered bilateral posterior basal impacted bronchioles. There is interlobular septal thickening in both lung bases which is probably due to interstitial edema. Edema is not seen elsewhere. There is reticulated scarring at both lung apices and again noted chronic subpleural reticulation in the right middle lobe and lateral right lower lobe base. The remainder of the lung fields are clear. There is no pleural thickening or pneumothorax. Upper Abdomen:  Splenomegaly again noted, mildly increased, AP diameter was previously 13.9 cm now 14.8 cm. There is bilateral renal cortical thinning. No acute findings. Musculoskeletal: There is osteopenia, degenerative changes and kyphosis of the thoracic spine. No acute or significant osseous finding is seen. No chest wall lesion. IMPRESSION: 1. Cardiomegaly with small pericardial effusion. 2. Upper-normal caliber pulmonary arteries. 3. Aortic and coronary artery atherosclerosis. 4. Small layering pleural effusions with adjacent compressive atelectasis. 5. Bilateral lower lobe bronchial thickening with scattered impacted bronchioles. No focal pneumonia is seen. 6. Interlobular septal thickening in the lung bases probably due to mild interstitial edema. Question mild CHF or fluid overload but no venous distention is evident. 7. Chronic changes in the right middle lobe and lateral right lower lobe base. 8. Mildly prominent mediastinal lymph nodes, increased in size from 2015. 9. Splenomegaly, mildly increased from 2015. 10. Small hiatal hernia. 11. Osteopenia, degenerative change and kyphosis of the thoracic spine. No acute or significant osseous findings. 12. Bilateral renal cortical thinning. Electronically Signed   By: Almira Bar M.D.   On: 01/03/2022 00:01   CT Head Wo Contrast  Result Date: 01/02/2022 CLINICAL DATA:  Head trauma, minor (Age >= 65y); Neck trauma (Age >= 65y) EXAM: CT HEAD WITHOUT CONTRAST CT CERVICAL SPINE WITHOUT CONTRAST TECHNIQUE: Multidetector CT imaging of the head and cervical spine was performed following the standard protocol without intravenous contrast. Multiplanar CT image reconstructions of the cervical spine were also generated. RADIATION DOSE REDUCTION: This exam was performed according to the departmental dose-optimization program which includes automated exposure control, adjustment of the mA and/or kV according to patient size and/or use of iterative reconstruction technique.  COMPARISON:  CT head and cervical spine 08/02/2021 FINDINGS: CT HEAD FINDINGS Brain: No evidence of large-territorial acute infarction. No parenchymal hemorrhage. No mass lesion. No extra-axial collection. No mass effect or midline shift. No hydrocephalus. Basilar cisterns are patent. Vascular: No hyperdense vessel. Atherosclerotic calcifications are present within the cavernous internal carotid arteries. Skull: No acute fracture or focal lesion. Sinuses/Orbits: Left maxillary sinus mucosal thickening. Otherwise paranasal sinuses and mastoid air cells are clear. Bilateral lens  replacement. Otherwise the orbits are unremarkable. Other: None. CT CERVICAL SPINE FINDINGS Alignment: Stable grade 1 anterolisthesis of C5 on C6. Stable mild retrolisthesis of C3 on C4. Skull base and vertebrae: Multilevel mild degenerative changes spine . No severe osseous central canal stenosis or neural foraminal stenosis. No acute fracture. No aggressive appearing focal osseous lesion or focal pathologic process. Soft tissues and spinal canal: No prevertebral fluid or swelling. No visible canal hematoma. Upper chest: Unremarkable. Other: None. IMPRESSION: 1. No acute intracranial abnormality. 2. No acute displaced fracture or traumatic listhesis of the cervical spine. Electronically Signed   By: Tish Frederickson M.D.   On: 01/02/2022 23:52   CT Cervical Spine Wo Contrast  Result Date: 01/02/2022 CLINICAL DATA:  Head trauma, minor (Age >= 65y); Neck trauma (Age >= 65y) EXAM: CT HEAD WITHOUT CONTRAST CT CERVICAL SPINE WITHOUT CONTRAST TECHNIQUE: Multidetector CT imaging of the head and cervical spine was performed following the standard protocol without intravenous contrast. Multiplanar CT image reconstructions of the cervical spine were also generated. RADIATION DOSE REDUCTION: This exam was performed according to the departmental dose-optimization program which includes automated exposure control, adjustment of the mA and/or kV  according to patient size and/or use of iterative reconstruction technique. COMPARISON:  CT head and cervical spine 08/02/2021 FINDINGS: CT HEAD FINDINGS Brain: No evidence of large-territorial acute infarction. No parenchymal hemorrhage. No mass lesion. No extra-axial collection. No mass effect or midline shift. No hydrocephalus. Basilar cisterns are patent. Vascular: No hyperdense vessel. Atherosclerotic calcifications are present within the cavernous internal carotid arteries. Skull: No acute fracture or focal lesion. Sinuses/Orbits: Left maxillary sinus mucosal thickening. Otherwise paranasal sinuses and mastoid air cells are clear. Bilateral lens replacement. Otherwise the orbits are unremarkable. Other: None. CT CERVICAL SPINE FINDINGS Alignment: Stable grade 1 anterolisthesis of C5 on C6. Stable mild retrolisthesis of C3 on C4. Skull base and vertebrae: Multilevel mild degenerative changes spine . No severe osseous central canal stenosis or neural foraminal stenosis. No acute fracture. No aggressive appearing focal osseous lesion or focal pathologic process. Soft tissues and spinal canal: No prevertebral fluid or swelling. No visible canal hematoma. Upper chest: Unremarkable. Other: None. IMPRESSION: 1. No acute intracranial abnormality. 2. No acute displaced fracture or traumatic listhesis of the cervical spine. Electronically Signed   By: Tish Frederickson M.D.   On: 01/02/2022 23:52   DG Chest Port 1 View  Result Date: 01/02/2022 CLINICAL DATA:  Questionable sepsis.  Evaluate abnormality. EXAM: PORTABLE CHEST 1 VIEW COMPARISON:  Chest radiograph dated October 16, 2021 FINDINGS: The heart is enlarged. Atherosclerotic calcification of the aortic arch. Biapical pleural/parenchymal scarring. Bibasilar atelectasis or infiltrate. No pleural effusion or pneumothorax. No acute osseous abnormality. IMPRESSION: 1. Bibasilar atelectasis or infiltrate. 2. Cardiomegaly. Electronically Signed   By: Larose Hires D.O.    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  3) BM Bx- consistent with MPN NOS  PLAN: -Discussed lab results from today, 07/30/2022, with the patient.  CBC with hgb of 12.4 and platelets of 402k with normal WBC counts Cmp with ckd creatinine 2.3 -mild iron deficiency noted. -No evidence of polycythemia or thrombocytosis related to her JAK2 mutation at this time. -No indication for starting hydroxyurea or any other treatment for the patient's JAK2 positive MPN at  this time.  FOLLOW-UP: RTC with Dr Candise Che with labs in 6 months   The total time spent in the appointment was 20 minutes* .  All of the patient's questions were answered with apparent satisfaction. The patient knows to call the clinic with any problems, questions or concerns.   Wyvonnia Lora MD MS AAHIVMS [facility] Hematology/Oncology [facility]  .*Total Encounter Time as defined by the Centers for Medicare and Medicaid Services includes, in addition to the face-to-face time of a patient visit (documented in the note above) non-face-to-face time: obtaining and reviewing outside history, ordering and reviewing medications, tests or procedures, care coordination (communications with other health care professionals or caregivers) and documentation in the medical record.   I, Ok Edwards, am acting as a Neurosurgeon for Wyvonnia Lora, MD. .I have reviewed the above documentation for accuracy and completeness, and I agree with the above. Johney Maine MD

## 2022-07-31 ENCOUNTER — Telehealth: Payer: Self-pay | Admitting: Hematology

## 2022-08-05 ENCOUNTER — Encounter (HOSPITAL_COMMUNITY): Payer: Self-pay

## 2022-08-05 ENCOUNTER — Encounter: Payer: Self-pay | Admitting: Hematology

## 2022-08-06 ENCOUNTER — Ambulatory Visit: Payer: Medicare Other | Admitting: Nurse Practitioner

## 2022-08-15 ENCOUNTER — Encounter: Payer: Self-pay | Admitting: Hematology

## 2022-08-15 ENCOUNTER — Ambulatory Visit: Payer: Medicare Other | Admitting: Podiatry

## 2022-08-15 ENCOUNTER — Encounter (HOSPITAL_COMMUNITY): Payer: Self-pay

## 2022-08-15 ENCOUNTER — Encounter: Payer: Self-pay | Admitting: Podiatry

## 2022-08-15 DIAGNOSIS — B351 Tinea unguium: Secondary | ICD-10-CM

## 2022-08-15 NOTE — Progress Notes (Signed)
Subjective:   Patient ID: [name] [name], female   DOB: 89 y.o.   MRN: [id number]   HPI Patient presents with nursing home concerned because her fourth nailbed right fell off and there is slight irritation of the surface with minimal discomfort and discoloration of adjacent nails   ROS      Objective:  Physical Exam  Neuro vascular status unchanged from previous visit with loss of fourth nail right slight irritation of the tissue no proximal edema erythema drainage with discoloration of adjacent nailbeds F2 probable trauma loss of nail right but does appear to be healthy base with bruising of other nails     Assessment:  Dictation above     Plan:  Advised on soaks cushioning the area and do not recommend antibiotics or other treatment hopefully this will get better over time and a new nail will regrow but if it does have problems may require antibiotics

## 2022-11-06 ENCOUNTER — Encounter: Payer: Medicare Other | Admitting: Internal Medicine

## 2022-11-07 DIAGNOSIS — D649 Anemia, unspecified: Secondary | ICD-10-CM

## 2022-11-07 DIAGNOSIS — N184 Chronic kidney disease, stage 4 (severe): Secondary | ICD-10-CM

## 2022-11-07 DIAGNOSIS — I10 Essential (primary) hypertension: Secondary | ICD-10-CM | POA: Diagnosis not present

## 2022-11-07 DIAGNOSIS — E1122 Type 2 diabetes mellitus with diabetic chronic kidney disease: Secondary | ICD-10-CM | POA: Diagnosis not present

## 2022-11-07 DIAGNOSIS — E039 Hypothyroidism, unspecified: Secondary | ICD-10-CM | POA: Diagnosis not present

## 2023-01-13 ENCOUNTER — Ambulatory Visit: Payer: Medicare Other | Admitting: Podiatry

## 2023-01-13 ENCOUNTER — Encounter: Payer: Self-pay | Admitting: Podiatry

## 2023-01-13 DIAGNOSIS — L97511 Non-pressure chronic ulcer of other part of right foot limited to breakdown of skin: Secondary | ICD-10-CM | POA: Diagnosis not present

## 2023-01-13 NOTE — Progress Notes (Signed)
Subjective:   Patient ID: [name] [name], female   DOB: 89 y.o.   MRN: [id number]   HPI Patient presents stating she irritated the outside of her right foot and it was a blister and she admits she peeled it herself.  Has been on antibiotics and feels some better but it still draining slightly   ROS      Objective:  Physical Exam  Neurovascular status unchanged with keratotic tissue plantar lateral aspect right fifth metatarsal localized slight breakdown of tissue measuring about 7 x 7 mm     Assessment:  Small ulceration right plantar forefoot with history of diabetes long-term moderate obesity     Plan:  Sterile sharp debridement accomplished flushed the area no bone tendon exposure and applied dressing around the area with cushioning so she does not bear weight on that issue.  Patient will be seen back to recheck all questions answered and if any redness swelling drainage pain recurs she is to contact us immediately

## 2023-01-27 ENCOUNTER — Other Ambulatory Visit: Payer: Self-pay

## 2023-01-27 DIAGNOSIS — D471 Chronic myeloproliferative disease: Secondary | ICD-10-CM

## 2023-01-27 DIAGNOSIS — D631 Anemia in chronic kidney disease: Secondary | ICD-10-CM

## 2023-01-28 ENCOUNTER — Inpatient Hospital Stay: Payer: Medicare Other | Admitting: Hematology

## 2023-01-28 ENCOUNTER — Inpatient Hospital Stay: Payer: Medicare Other | Attending: Internal Medicine

## 2023-01-28 VITALS — BP 141/51 | HR 95 | Temp 97.7°F | Resp 16 | Wt 166.0 lb

## 2023-01-28 DIAGNOSIS — D471 Chronic myeloproliferative disease: Secondary | ICD-10-CM | POA: Diagnosis not present

## 2023-01-28 DIAGNOSIS — D631 Anemia in chronic kidney disease: Secondary | ICD-10-CM

## 2023-01-28 DIAGNOSIS — N184 Chronic kidney disease, stage 4 (severe): Secondary | ICD-10-CM | POA: Diagnosis not present

## 2023-01-28 DIAGNOSIS — Z87440 Personal history of urinary (tract) infections: Secondary | ICD-10-CM | POA: Insufficient documentation

## 2023-01-28 DIAGNOSIS — D649 Anemia, unspecified: Secondary | ICD-10-CM | POA: Insufficient documentation

## 2023-01-28 LAB — CBC WITH DIFFERENTIAL (CANCER CENTER ONLY)
Abs Immature Granulocytes: 0.06 10*3/uL (ref 0.00–0.07)
Basophils Absolute: 0 10*3/uL (ref 0.0–0.1)
Basophils Relative: 0 %
Eosinophils Absolute: 0.7 10*3/uL — ABNORMAL HIGH (ref 0.0–0.5)
Eosinophils Relative: 9 %
HCT: 32.7 % — ABNORMAL LOW (ref 36.0–46.0)
Hemoglobin: 9.9 g/dL — ABNORMAL LOW (ref 12.0–15.0)
Immature Granulocytes: 1 %
Lymphocytes Relative: 5 %
Lymphs Abs: 0.4 10*3/uL — ABNORMAL LOW (ref 0.7–4.0)
MCH: 25.2 pg — ABNORMAL LOW (ref 26.0–34.0)
MCHC: 30.3 g/dL (ref 30.0–36.0)
MCV: 83.2 fL (ref 80.0–100.0)
Monocytes Absolute: 0.5 10*3/uL (ref 0.1–1.0)
Monocytes Relative: 7 %
Neutro Abs: 6.3 10*3/uL (ref 1.7–7.7)
Neutrophils Relative %: 78 %
Platelet Count: 328 10*3/uL (ref 150–400)
RBC: 3.93 MIL/uL (ref 3.87–5.11)
RDW: 22.5 % — ABNORMAL HIGH (ref 11.5–15.5)
WBC Count: 7.9 10*3/uL (ref 4.0–10.5)
nRBC: 0 % (ref 0.0–0.2)

## 2023-01-28 LAB — IRON AND IRON BINDING CAPACITY (CC-WL,HP ONLY)
Iron: 22 ug/dL — ABNORMAL LOW (ref 28–170)
Saturation Ratios: 9 % — ABNORMAL LOW (ref 10.4–31.8)
TIBC: 252 ug/dL (ref 250–450)
UIBC: 230 ug/dL (ref 148–442)

## 2023-01-28 LAB — LACTATE DEHYDROGENASE: LDH: 369 U/L — ABNORMAL HIGH (ref 98–192)

## 2023-01-28 LAB — CMP (CANCER CENTER ONLY)
ALT: 7 U/L (ref 0–44)
AST: 11 U/L — ABNORMAL LOW (ref 15–41)
Albumin: 3.9 g/dL (ref 3.5–5.0)
Alkaline Phosphatase: 60 U/L (ref 38–126)
Anion gap: 11 (ref 5–15)
BUN: 102 mg/dL — ABNORMAL HIGH (ref 8–23)
CO2: 20 mmol/L — ABNORMAL LOW (ref 22–32)
Calcium: 9.5 mg/dL (ref 8.9–10.3)
Chloride: 107 mmol/L (ref 98–111)
Creatinine: 2.87 mg/dL — ABNORMAL HIGH (ref 0.44–1.00)
GFR, Estimated: 15 mL/min — ABNORMAL LOW (ref >60)
Glucose, Bld: 253 mg/dL — ABNORMAL HIGH (ref 70–99)
Potassium: 3.8 mmol/L (ref 3.5–5.1)
Sodium: 138 mmol/L (ref 135–145)
Total Bilirubin: 0.4 mg/dL (ref <1.2)
Total Protein: 6.5 g/dL (ref 6.5–8.1)

## 2023-01-28 LAB — FERRITIN: Ferritin: 60 ng/mL (ref 11–307)

## 2023-01-28 NOTE — Progress Notes (Addendum)
HEMATOLOGY/ONCOLOGY CLINIC NOTE  Date of Service: 01/28/23   Patient Care Team: Eloisa Northern, MD as PCP - General (Internal Medicine) Estanislado Emms, MD as Consulting Physician (Internal Medicine) Lajoyce Corners, [facility] (Inactive) as Pharmacist (Pharmacist)  CHIEF COMPLAINTS/PURPOSE OF CONSULTATION:  Follow-up for JAK2 positive myeloproliferative neoplasm  HISTORY OF PRESENTING ILLNESS:  Please see previous note for details on initial presentation  INTERVAL HISTORY:   [name] [name] is a 89 y.o. female here for follow-up of her JAK2 positive myeloproliferative neoplasm.    Patient was last seen by me on 07/30/2022 and she complained of an ingrown nail infection on her right hand and a skin lesion on her left shoulder and underneath her left eye.   Patient notes she has been doing well overall since our last visit. She complains of burning sensation when urinating and fatigue/lethargy. She had similar burning sensation when she previously had an UTI. Patient notes that she started feeling tired this week.   She has been eating well and she has gained around 2 lbs since our last visit.   She reports mild congestion at night due to the heat being on.   Patient denies taking iron supplement.   She denies any new infection issues, fever, chills, night sweats, unexpected weight loss, abdominal pain, chest pain, back pain, or leg swelling. She does complain of mild right lower abdominal pain.   MEDICAL HISTORY:  Past Medical History:  Diagnosis Date   Anemia, unspecified    Arthritis    "knees; right shoulder" (09/15/2013)   Basal cell carcinoma    "right upper outer lip"   DM neuropathy, type II diabetes mellitus (HCC)    Dyspnea 2021   with low iron   GERD (gastroesophageal reflux disease)    Gout    High cholesterol    Hypertension    Hypothyroidism    Meniere's disease    Obesity (BMI 30-39.9)    Other forms of epilepsy and recurrent seizures without mention of  intractable epilepsy 11/04/2012   Non convulsive paroxysmal spells, responding to [facility], patient not driving.    Pneumonia ~ 1943   Skin cancer    "forehead; right hand"   Stage 4 chronic kidney disease (HCC)    UTI (urinary tract infection)    Vitamin D deficiency     SURGICAL HISTORY: Past Surgical History:  Procedure Laterality Date   BIOPSY  04/05/2020   Procedure: BIOPSY;  Surgeon: Beverley Fiedler, MD;  Location: [facility];  Service: Gastroenterology;;   CERVICAL POLYPECTOMY     COLONOSCOPY WITH PROPOFOL N/A 04/06/2020   Procedure: COLONOSCOPY WITH PROPOFOL;  Surgeon: Beverley Fiedler, MD;  Location: [facility];  Service: Gastroenterology;  Laterality: N/A;   ESOPHAGOGASTRODUODENOSCOPY (EGD) WITH PROPOFOL N/A 04/05/2020   Procedure: ESOPHAGOGASTRODUODENOSCOPY (EGD) WITH PROPOFOL;  Surgeon: Beverley Fiedler, MD;  Location: [facility];  Service: Gastroenterology;  Laterality: N/A;   HAMMER TOE SURGERY Left 1980's   IR FLUORO GUIDE CV LINE RIGHT  01/09/2022   IR PERC TUN PERIT CATH WO PORT S&I /IMAG  01/09/2022   IR REMOVAL TUN CV CATH W/O FL  02/15/2022   IR US GUIDE VASC ACCESS RIGHT  01/09/2022   MOHS SURGERY  20087   "right upper outer lip"   POLYPECTOMY  04/06/2020   Procedure: POLYPECTOMY;  Surgeon: Beverley Fiedler, MD;  Location: [facility];  Service: Gastroenterology;;   TONSILLECTOMY  ~ 1947   WRIST SURGERY Left ~ 1950   "ran arm  thru window"    SOCIAL HISTORY: Social History   Socioeconomic History   Marital status: Widowed    Spouse name: Not on file   Number of children: 3   Years of education: 68   Highest education level: Not on file  Occupational History   Occupation: retired  Tobacco Use   Smoking status: Former    Current packs/day: 0.00    Average packs/day: 1 pack/day for 30.0 years (30.0 ttl pk-yrs)    Types: Cigarettes    Start date: 06/28/1953    Quit date: 06/29/1983    Years since quitting: 39.6   Smokeless tobacco: Never  Vaping Use   Vaping  status: Never Used  Substance and Sexual Activity   Alcohol use: Not Currently   Drug use: No   Sexual activity: Not Currently  Other Topics Concern   Not on file  Social History Narrative   Patient lives at home alone and she is widowed.   Retired.   Education some college.   Right handed.   Caffeine sometimes not daily.    Social Determinants of Health   Financial Resource Strain: Not on file  Food Insecurity: No Food Insecurity (01/03/2022)   Hunger Vital Sign    Worried About Running Out of Food in the Last Year: Never true    Ran Out of Food in the Last Year: Never true  Transportation Needs: No Transportation Needs (01/03/2022)   PRAPARE - Administrator, Civil Service (Medical): No    Lack of Transportation (Non-Medical): No  Physical Activity: Not on file  Stress: Not on file  Social Connections: Not on file  Intimate Partner Violence: Not At Risk (01/03/2022)   Humiliation, Afraid, Rape, and Kick questionnaire    Fear of Current or Ex-Partner: No    Emotionally Abused: No    Physically Abused: No    Sexually Abused: No    FAMILY HISTORY: Family History  Problem Relation Age of Onset   Heart disease Mother    Stroke Mother    Heart disease Father    Ovarian cancer Sister    Hypertension Son    Hypertension Son    Diabetes Son    Alcohol abuse Son     ALLERGIES:  is allergic to ppd [tuberculin purified protein derivative].  MEDICATIONS:  Current Outpatient Medications  Medication Sig Dispense Refill   acetaminophen (TYLENOL) 500 MG tablet Take 1,000 mg by mouth every 6 (six) hours as needed for moderate pain.     allopurinol (ZYLOPRIM) 100 MG tablet Take 1 tablet (100 mg total) by mouth 2 (two) times daily. Take  1 tablet  Daily  to prevent Gout. (Patient taking differently: Take 100 mg by mouth 2 (two) times daily.) 180 tablet 2   aspirin 81 MG tablet Take 81 mg by mouth daily.     cholecalciferol (VITAMIN D3) 25 MCG (1000 UNIT) tablet Take  3,000 Units by mouth every evening.     dapagliflozin propanediol (FARXIGA) 10 MG TABS tablet Take 1 tablet (10 mg total) by mouth daily before breakfast. 30 tablet 6   fish oil-omega-3 fatty acids 1000 MG capsule Take 1,000 mg by mouth at bedtime.     furosemide (LASIX) 40 MG tablet Take 40 mg by mouth in the morning.     hydrocortisone (ANUSOL-HC) 2.5 % rectal cream Place rectally 2 (two) times daily. (Patient taking differently: Place 1 Application rectally 2 (two) times daily.) 30 g 0   lansoprazole (PREVACID) 30 MG  capsule TAKE 1 CAPSULE BY MOUTH DAILY TO PREVENT HEARTBURN AND INDIGESTION. (Patient taking differently: Take 30 mg by mouth daily in the afternoon.) 90 capsule 3   levETIRAcetam (KEPPRA) 500 MG tablet Take  1 tablet 2  x /day  with Meals  to Prevent Seizures (Patient taking differently: Take 500 mg by mouth 2 (two) times daily.) 180 tablet 3   loratadine (CLARITIN) 10 MG tablet Take 10 mg by mouth in the morning.     meclizine (ANTIVERT) 25 MG tablet Take   1 tablet 2 to 3 x /day  ONLY  if needed for Dizziness /Vertigo (Patient taking differently: Take 25 mg by mouth every 8 (eight) hours as needed (vertigo).) 90 tablet 1   melatonin 3 MG TABS tablet Take 3 mg by mouth at bedtime.     metoprolol succinate (TOPROL-XL) 25 MG 24 hr tablet Take 0.5 tablets (12.5 mg total) by mouth daily. 15 tablet 6   rOPINIRole (REQUIP) 1 MG tablet TAKE ONE-HALF TO ONE TABLET BY MOUTH THREE TIMES DAILY AS NEEDED FOR RESTLESS LEGS& CRAMPS (Patient taking differently: Take 1 mg by mouth every 8 (eight) hours as needed (restless legs, cramps).) 270 tablet 3   sitaGLIPtin (JANUVIA) 100 MG tablet Take 1 tablet (100 mg total) by mouth daily. 30 tablet 6   vitamin B-12 (CYANOCOBALAMIN) 100 MCG tablet Take 500 mcg by mouth daily.     vitamin C (ASCORBIC ACID) 500 MG tablet Take 500 mg by mouth in the morning.     No current facility-administered medications for this visit.    REVIEW OF SYSTEMS:   .10 Point  review of Systems was done is negative except as noted above.  PHYSICAL EXAMINATION: ECOG PERFORMANCE STATUS: 2 - Symptomatic, <50% confined to bed  Vitals:   01/28/23 1005  BP: (!) 141/51  Pulse: 95  Resp: 16  Temp: 97.7 F (36.5 C)  SpO2: 92%    .Body mass index is 30.36 kg/m.  NAD GENERAL:alert, in no acute distress and comfortable SKIN: no acute rashes, no significant lesions EYES: conjunctiva are pink and non-injected, sclera anicteric NECK: supple, no JVD LYMPH:  no palpable lymphadenopathy in the cervical, axillary or inguinal regions LUNGS: clear to auscultation b/l with normal respiratory effort HEART: regular rate & rhythm ABDOMEN:  normoactive bowel sounds , non tender, not distended. No palpable hepato-splenomegaly. Extremity: no pedal edema PSYCH: alert & oriented x 3 with fluent speech NEURO: no focal motor/sensory deficits  LABORATORY DATA:  I have reviewed the data as listed  .    Latest Ref Rng & Units 01/28/2023    9:23 AM 07/30/2022    1:34 PM 01/29/2022   11:07 AM  CBC  WBC 4.0 - 10.5 K/uL 7.9  5.4  6.6   Hemoglobin 12.0 - 15.0 g/dL 9.9  40.9  9.6   Hematocrit 36.0 - 46.0 % 32.7  40.0  33.9   Platelets 150 - 400 K/uL 328  402  307    . CBC    Component Value Date/Time   WBC 7.9 01/28/2023 0923   WBC 6.1 01/10/2022 0510   RBC 3.93 01/28/2023 0923   HGB 9.9 (L) 01/28/2023 0923   HGB 11.5 (L) 10/05/2012 1111   HCT 32.7 (L) 01/28/2023 0923   HCT 34.2 (L) 10/05/2012 1111   PLT 328 01/28/2023 0923   PLT 258 10/05/2012 1111   MCV 83.2 01/28/2023 0923   MCV 90.1 10/05/2012 1111   MCH 25.2 (L) 01/28/2023 0923   MCHC  30.3 01/28/2023 0923   RDW 22.5 (H) 01/28/2023 0923   RDW 14.2 10/05/2012 1111   LYMPHSABS 0.4 (L) 01/28/2023 0923   LYMPHSABS 1.4 10/05/2012 1111   MONOABS 0.5 01/28/2023 0923   MONOABS 0.5 10/05/2012 1111   EOSABS 0.7 (H) 01/28/2023 0923   EOSABS 0.6 (H) 10/05/2012 1111   BASOSABS 0.0 01/28/2023 0923   BASOSABS 0.0  10/05/2012 1111    .    Latest Ref Rng & Units 01/28/2023    9:23 AM 07/30/2022    1:34 PM 04/25/2022    3:24 PM  CMP  Glucose 70 - 99 mg/dL 630  160  109   BUN 8 - 23 mg/dL 323  46  45   Creatinine 0.44 - 1.00 mg/dL 5.57  3.22  0.25   Sodium 135 - 145 mmol/L 138  140  138   Potassium 3.5 - 5.1 mmol/L 3.8  4.5  5.0   Chloride 98 - 111 mmol/L 107  105  103   CO2 22 - 32 mmol/L 20  27  20    Calcium 8.9 - 10.3 mg/dL 9.5  42.7  06.2   Total Protein 6.5 - 8.1 g/dL 6.5  6.9  6.6   Total Bilirubin <1.2 mg/dL 0.4  0.5  0.5   Alkaline Phos 38 - 126 U/L 60  67  88   AST 15 - 41 U/L 11  13  18    ALT 0 - 44 U/L 7  9  8     . Lab Results  Component Value Date   IRON 22 (L) 01/28/2023   TIBC 252 01/28/2023   IRONPCTSAT 9 (L) 01/28/2023   (Iron and TIBC)  Lab Results  Component Value Date   FERRITIN 60 01/28/2023   11/26/2019   08/10/2019 Iliac Crest Bone Marrow Report [id number]):   08/10/2019 Flow Pathology [id number]):   08/10/2019 Cytogenetics:    RADIOGRAPHIC STUDIES: I have personally reviewed the radiological images as listed and agreed with the findings in the report. No results found.   ASSESSMENT & PLAN:   89 yo with   1) Normocytic Anemia  Likely related to CKD and functional iron deficiency  2) JAK2 positive MPN - likely ET -- platelets have starting increasing recently.  3) BM Bx- consistent with MPN NOS  PLAN: -Discussed lab results from today, 01/28/2023, in detail with the patient. CBC shows patient is anemic with hemoglobin of 9.9 g/dL with hematocrit of 94.8%. Cmp shows elevated glucose level of 253, elevated BUN of 102, elevated creatinine of 2.87, and low AST of 11. -Discussed the option of IV Iron infusion since she is iron deficient and anemic. Patient agrees.  -Discussed the option of urine sample for UTI. Patient will get urine sample at her nursing facility.  -mild iron deficiency noted. -No evidence of polycythemia or thrombocytosis  related to her JAK2 mutation at this time. -No indication for starting hydroxyurea or any other treatment for the patient's JAK2 positive MPN at this time. -Schedule patient for IV Iron infusion.   FOLLOW-UP: IV venofer 200mg  weekly x 2 doses RTC with Dr Candise Che with labs in 4 months  The total time spent in the appointment was 30 minutes* .  All of the patient's questions were answered with apparent satisfaction. The patient knows to call the clinic with any problems, questions or concerns.   Wyvonnia Lora MD MS AAHIVMS [facility] Hematology/Oncology Physician [facility]  .*Total Encounter Time as defined by the Centers for Medicare and  Medicaid Services includes, in addition to the face-to-face time of a patient visit (documented in the note above) non-face-to-face time: obtaining and reviewing outside history, ordering and reviewing medications, tests or procedures, care coordination (communications with other health care professionals or caregivers) and documentation in the medical record.   I,Param Shah,acting as a Neurosurgeon for Wyvonnia Lora, MD.,have documented all relevant documentation on the behalf of Wyvonnia Lora, MD,as directed by  Wyvonnia Lora, MD while in the presence of Wyvonnia Lora, MD.   .I have reviewed the above documentation for accuracy and completeness, and I agree with the above. Johney Maine MD

## 2023-02-03 ENCOUNTER — Encounter (HOSPITAL_COMMUNITY): Payer: Self-pay

## 2023-02-03 ENCOUNTER — Encounter: Payer: Self-pay | Admitting: Hematology

## 2023-02-04 ENCOUNTER — Telehealth: Payer: Self-pay | Admitting: Hematology

## 2023-02-04 NOTE — Telephone Encounter (Signed)
Left patient a message in regards to scheduling; left callback for patient to schedule future appointments

## 2023-02-10 ENCOUNTER — Other Ambulatory Visit: Payer: Self-pay | Admitting: Hematology

## 2023-02-11 ENCOUNTER — Emergency Department (HOSPITAL_COMMUNITY): Payer: Medicare Other

## 2023-02-11 ENCOUNTER — Encounter (HOSPITAL_COMMUNITY): Payer: Self-pay

## 2023-02-11 ENCOUNTER — Other Ambulatory Visit: Payer: Self-pay

## 2023-02-11 ENCOUNTER — Inpatient Hospital Stay (HOSPITAL_COMMUNITY)
Admission: EM | Admit: 2023-02-11 | Discharge: 2023-02-17 | DRG: 291 | Disposition: A | Discharge status: Home Health | Payer: Medicare Other | Attending: Family Medicine | Admitting: Family Medicine

## 2023-02-11 ENCOUNTER — Observation Stay (HOSPITAL_COMMUNITY): Payer: Medicare Other

## 2023-02-11 DIAGNOSIS — D638 Anemia in other chronic diseases classified elsewhere: Secondary | ICD-10-CM | POA: Diagnosis not present

## 2023-02-11 DIAGNOSIS — E1122 Type 2 diabetes mellitus with diabetic chronic kidney disease: Secondary | ICD-10-CM | POA: Diagnosis present

## 2023-02-11 DIAGNOSIS — I272 Pulmonary hypertension, unspecified: Secondary | ICD-10-CM | POA: Diagnosis present

## 2023-02-11 DIAGNOSIS — N184 Chronic kidney disease, stage 4 (severe): Secondary | ICD-10-CM | POA: Diagnosis present

## 2023-02-11 DIAGNOSIS — Z79890 Hormone replacement therapy (postmenopausal): Secondary | ICD-10-CM

## 2023-02-11 DIAGNOSIS — G2581 Restless legs syndrome: Secondary | ICD-10-CM | POA: Diagnosis present

## 2023-02-11 DIAGNOSIS — I5032 Chronic diastolic (congestive) heart failure: Secondary | ICD-10-CM | POA: Diagnosis present

## 2023-02-11 DIAGNOSIS — E1149 Type 2 diabetes mellitus with other diabetic neurological complication: Secondary | ICD-10-CM | POA: Diagnosis present

## 2023-02-11 DIAGNOSIS — Z79899 Other long term (current) drug therapy: Secondary | ICD-10-CM

## 2023-02-11 DIAGNOSIS — Z87891 Personal history of nicotine dependence: Secondary | ICD-10-CM

## 2023-02-11 DIAGNOSIS — D509 Iron deficiency anemia, unspecified: Secondary | ICD-10-CM | POA: Diagnosis present

## 2023-02-11 DIAGNOSIS — J9621 Acute and chronic respiratory failure with hypoxia: Secondary | ICD-10-CM | POA: Diagnosis present

## 2023-02-11 DIAGNOSIS — E785 Hyperlipidemia, unspecified: Secondary | ICD-10-CM

## 2023-02-11 DIAGNOSIS — Z6835 Body mass index (BMI) 35.0-35.9, adult: Secondary | ICD-10-CM

## 2023-02-11 DIAGNOSIS — D631 Anemia in chronic kidney disease: Secondary | ICD-10-CM | POA: Diagnosis present

## 2023-02-11 DIAGNOSIS — E559 Vitamin D deficiency, unspecified: Secondary | ICD-10-CM | POA: Diagnosis present

## 2023-02-11 DIAGNOSIS — Z9981 Dependence on supplemental oxygen: Secondary | ICD-10-CM

## 2023-02-11 DIAGNOSIS — Z7984 Long term (current) use of oral hypoglycemic drugs: Secondary | ICD-10-CM

## 2023-02-11 DIAGNOSIS — I350 Nonrheumatic aortic (valve) stenosis: Secondary | ICD-10-CM

## 2023-02-11 DIAGNOSIS — R0602 Shortness of breath: Secondary | ICD-10-CM | POA: Diagnosis not present

## 2023-02-11 DIAGNOSIS — K219 Gastro-esophageal reflux disease without esophagitis: Secondary | ICD-10-CM | POA: Diagnosis present

## 2023-02-11 DIAGNOSIS — I4510 Unspecified right bundle-branch block: Secondary | ICD-10-CM | POA: Diagnosis present

## 2023-02-11 DIAGNOSIS — I10 Essential (primary) hypertension: Secondary | ICD-10-CM | POA: Diagnosis present

## 2023-02-11 DIAGNOSIS — Z1152 Encounter for screening for COVID-19: Secondary | ICD-10-CM

## 2023-02-11 DIAGNOSIS — Z833 Family history of diabetes mellitus: Secondary | ICD-10-CM

## 2023-02-11 DIAGNOSIS — R0609 Other forms of dyspnea: Secondary | ICD-10-CM

## 2023-02-11 DIAGNOSIS — E1165 Type 2 diabetes mellitus with hyperglycemia: Secondary | ICD-10-CM | POA: Diagnosis present

## 2023-02-11 DIAGNOSIS — I429 Cardiomyopathy, unspecified: Secondary | ICD-10-CM | POA: Diagnosis present

## 2023-02-11 DIAGNOSIS — Z8249 Family history of ischemic heart disease and other diseases of the circulatory system: Secondary | ICD-10-CM

## 2023-02-11 DIAGNOSIS — I130 Hypertensive heart and chronic kidney disease with heart failure and stage 1 through stage 4 chronic kidney disease, or unspecified chronic kidney disease: Principal | ICD-10-CM | POA: Diagnosis present

## 2023-02-11 DIAGNOSIS — E7800 Pure hypercholesterolemia, unspecified: Secondary | ICD-10-CM | POA: Diagnosis present

## 2023-02-11 DIAGNOSIS — I5033 Acute on chronic diastolic (congestive) heart failure: Secondary | ICD-10-CM | POA: Diagnosis present

## 2023-02-11 DIAGNOSIS — D649 Anemia, unspecified: Secondary | ICD-10-CM

## 2023-02-11 DIAGNOSIS — Z85828 Personal history of other malignant neoplasm of skin: Secondary | ICD-10-CM

## 2023-02-11 DIAGNOSIS — E1169 Type 2 diabetes mellitus with other specified complication: Secondary | ICD-10-CM | POA: Diagnosis present

## 2023-02-11 DIAGNOSIS — Z887 Allergy status to serum and vaccine status: Secondary | ICD-10-CM

## 2023-02-11 DIAGNOSIS — G40909 Epilepsy, unspecified, not intractable, without status epilepticus: Secondary | ICD-10-CM | POA: Diagnosis present

## 2023-02-11 DIAGNOSIS — Z66 Do not resuscitate: Secondary | ICD-10-CM | POA: Diagnosis present

## 2023-02-11 DIAGNOSIS — M109 Gout, unspecified: Secondary | ICD-10-CM | POA: Diagnosis present

## 2023-02-11 DIAGNOSIS — Z7982 Long term (current) use of aspirin: Secondary | ICD-10-CM

## 2023-02-11 DIAGNOSIS — D500 Iron deficiency anemia secondary to blood loss (chronic): Secondary | ICD-10-CM

## 2023-02-11 DIAGNOSIS — E1140 Type 2 diabetes mellitus with diabetic neuropathy, unspecified: Secondary | ICD-10-CM | POA: Diagnosis present

## 2023-02-11 DIAGNOSIS — Z880 Allergy status to penicillin: Secondary | ICD-10-CM

## 2023-02-11 DIAGNOSIS — E039 Hypothyroidism, unspecified: Secondary | ICD-10-CM | POA: Diagnosis not present

## 2023-02-11 DIAGNOSIS — G4733 Obstructive sleep apnea (adult) (pediatric): Secondary | ICD-10-CM | POA: Diagnosis present

## 2023-02-11 DIAGNOSIS — J9601 Acute respiratory failure with hypoxia: Principal | ICD-10-CM

## 2023-02-11 DIAGNOSIS — Z8041 Family history of malignant neoplasm of ovary: Secondary | ICD-10-CM

## 2023-02-11 DIAGNOSIS — D471 Chronic myeloproliferative disease: Secondary | ICD-10-CM

## 2023-02-11 DIAGNOSIS — Z823 Family history of stroke: Secondary | ICD-10-CM

## 2023-02-11 DIAGNOSIS — Z811 Family history of alcohol abuse and dependence: Secondary | ICD-10-CM

## 2023-02-11 LAB — URINALYSIS, W/ REFLEX TO CULTURE (INFECTION SUSPECTED)
Bilirubin Urine: NEGATIVE
Glucose, UA: >=500 mg/dL — AB
Hgb urine dipstick: NEGATIVE
Ketones, ur: NEGATIVE mg/dL
Nitrite: NEGATIVE
Protein, ur: NEGATIVE mg/dL
Specific Gravity, Urine: 1.003 — ABNORMAL LOW (ref 1.005–1.030)
pH: 5 (ref 5.0–8.0)

## 2023-02-11 LAB — ECHOCARDIOGRAM COMPLETE
AR max vel: 0.84 cm2
AV Area VTI: 0.99 cm2
AV Area mean vel: 0.84 cm2
AV Mean grad: 39.5 mm[Hg]
AV Peak grad: 66.3 mm[Hg]
Ao pk vel: 4.07 m/s
Area-P 1/2: 3.85 cm2
Height: 62 in
S' Lateral: 2.1 cm
Weight: 2656.1 [oz_av]

## 2023-02-11 LAB — CBC WITH DIFFERENTIAL/PLATELET
Abs Immature Granulocytes: 0 10*3/uL (ref 0.00–0.07)
Basophils Absolute: 0.1 10*3/uL (ref 0.0–0.1)
Basophils Relative: 1 %
Eosinophils Absolute: 0.2 10*3/uL (ref 0.0–0.5)
Eosinophils Relative: 3 %
HCT: 28.5 % — ABNORMAL LOW (ref 36.0–46.0)
Hemoglobin: 8.2 g/dL — ABNORMAL LOW (ref 12.0–15.0)
Lymphocytes Relative: 10 %
Lymphs Abs: 0.7 10*3/uL (ref 0.7–4.0)
MCH: 24.3 pg — ABNORMAL LOW (ref 26.0–34.0)
MCHC: 28.8 g/dL — ABNORMAL LOW (ref 30.0–36.0)
MCV: 84.6 fL (ref 80.0–100.0)
Monocytes Absolute: 0.1 10*3/uL (ref 0.1–1.0)
Monocytes Relative: 1 %
Neutro Abs: 5.5 10*3/uL (ref 1.7–7.7)
Neutrophils Relative %: 85 %
Platelets: 313 10*3/uL (ref 150–400)
RBC: 3.37 MIL/uL — ABNORMAL LOW (ref 3.87–5.11)
RDW: 22.9 % — ABNORMAL HIGH (ref 11.5–15.5)
WBC: 6.5 10*3/uL (ref 4.0–10.5)
nRBC: 0 /100{WBCs}
nRBC: 0.3 % — ABNORMAL HIGH (ref 0.0–0.2)

## 2023-02-11 LAB — RESPIRATORY PANEL BY PCR

## 2023-02-11 LAB — RESP PANEL BY RT-PCR (RSV, FLU A&B, COVID)  RVPGX2
Influenza A by PCR: NEGATIVE
Influenza B by PCR: NEGATIVE
Resp Syncytial Virus by PCR: NEGATIVE
SARS Coronavirus 2 by RT PCR: NEGATIVE

## 2023-02-11 LAB — BRAIN NATRIURETIC PEPTIDE: B Natriuretic Peptide: 916.9 pg/mL — ABNORMAL HIGH (ref 0.0–100.0)

## 2023-02-11 LAB — TROPONIN I (HIGH SENSITIVITY): Troponin I (High Sensitivity): 11 ng/L (ref <18)

## 2023-02-11 LAB — COMPREHENSIVE METABOLIC PANEL
ALT: 14 U/L (ref 0–44)
AST: 14 U/L — ABNORMAL LOW (ref 15–41)
Albumin: 3.3 g/dL — ABNORMAL LOW (ref 3.5–5.0)
Alkaline Phosphatase: 54 U/L (ref 38–126)
Anion gap: 11 (ref 5–15)
BUN: 60 mg/dL — ABNORMAL HIGH (ref 8–23)
CO2: 15 mmol/L — ABNORMAL LOW (ref 22–32)
Calcium: 8.8 mg/dL — ABNORMAL LOW (ref 8.9–10.3)
Chloride: 111 mmol/L (ref 98–111)
Creatinine, Ser: 2.55 mg/dL — ABNORMAL HIGH (ref 0.44–1.00)
GFR, Estimated: 18 mL/min — ABNORMAL LOW (ref >60)
Glucose, Bld: 246 mg/dL — ABNORMAL HIGH (ref 70–99)
Potassium: 3.8 mmol/L (ref 3.5–5.1)
Sodium: 137 mmol/L (ref 135–145)
Total Bilirubin: 0.7 mg/dL (ref <1.2)
Total Protein: 6.2 g/dL — ABNORMAL LOW (ref 6.5–8.1)

## 2023-02-11 LAB — PROCALCITONIN: Procalcitonin: <0.1 ng/mL

## 2023-02-11 LAB — PROTIME-INR
INR: 1.4 — ABNORMAL HIGH (ref 0.8–1.2)
Prothrombin Time: 17 s — ABNORMAL HIGH (ref 11.4–15.2)

## 2023-02-11 LAB — GLUCOSE, CAPILLARY
Glucose-Capillary: 236 mg/dL — ABNORMAL HIGH (ref 70–99)
Glucose-Capillary: 239 mg/dL — ABNORMAL HIGH (ref 70–99)
Glucose-Capillary: 252 mg/dL — ABNORMAL HIGH (ref 70–99)

## 2023-02-11 LAB — APTT: aPTT: 31 s (ref 24–36)

## 2023-02-11 LAB — I-STAT CG4 LACTIC ACID, ED: Lactic Acid, Venous: 0.7 mmol/L (ref 0.5–1.9)

## 2023-02-11 MED ORDER — ROPINIROLE HCL 0.5 MG PO TABS
0.5000 mg | ORAL_TABLET | Freq: Every day | ORAL | Status: DC
  Administered 2023-02-11 – 2023-02-16 (×6): 0.5 mg via ORAL
  Filled 2023-02-11 (×7): qty 1

## 2023-02-11 MED ORDER — SODIUM CHLORIDE 0.9 % IV SOLN
2.0000 g | INTRAVENOUS | Status: DC
  Administered 2023-02-11: 2 g via INTRAVENOUS
  Filled 2023-02-11: qty 20

## 2023-02-11 MED ORDER — ACETAMINOPHEN 325 MG PO TABS
650.0000 mg | ORAL_TABLET | Freq: Four times a day (QID) | ORAL | Status: DC | PRN
  Administered 2023-02-15 – 2023-02-16 (×2): 650 mg via ORAL
  Filled 2023-02-11 (×2): qty 2

## 2023-02-11 MED ORDER — SODIUM CHLORIDE 0.9% FLUSH
3.0000 mL | Freq: Two times a day (BID) | INTRAVENOUS | Status: DC
  Administered 2023-02-11 – 2023-02-16 (×8): 3 mL via INTRAVENOUS

## 2023-02-11 MED ORDER — MELATONIN 3 MG PO TABS
3.0000 mg | ORAL_TABLET | Freq: Every day | ORAL | Status: DC
  Administered 2023-02-11 – 2023-02-16 (×6): 3 mg via ORAL
  Filled 2023-02-11 (×6): qty 1

## 2023-02-11 MED ORDER — LEVOTHYROXINE SODIUM 112 MCG PO TABS
112.0000 ug | ORAL_TABLET | Freq: Every morning | ORAL | Status: DC
  Administered 2023-02-12 – 2023-02-17 (×6): 112 ug via ORAL
  Filled 2023-02-11 (×8): qty 1

## 2023-02-11 MED ORDER — METOPROLOL SUCCINATE ER 25 MG PO TB24
12.5000 mg | ORAL_TABLET | Freq: Every day | ORAL | Status: DC
  Administered 2023-02-12: 12.5 mg via ORAL
  Filled 2023-02-11: qty 1

## 2023-02-11 MED ORDER — INSULIN ASPART 100 UNIT/ML IJ SOLN
0.0000 [IU] | Freq: Three times a day (TID) | INTRAMUSCULAR | Status: DC
  Administered 2023-02-11 – 2023-02-12 (×2): 3 [IU] via SUBCUTANEOUS
  Administered 2023-02-12: 2 [IU] via SUBCUTANEOUS
  Administered 2023-02-12: 5 [IU] via SUBCUTANEOUS
  Administered 2023-02-13: 7 [IU] via SUBCUTANEOUS
  Administered 2023-02-13 (×2): 2 [IU] via SUBCUTANEOUS
  Administered 2023-02-14: 5 [IU] via SUBCUTANEOUS
  Administered 2023-02-14 (×2): 2 [IU] via SUBCUTANEOUS
  Administered 2023-02-15: 3 [IU] via SUBCUTANEOUS
  Administered 2023-02-15: 2 [IU] via SUBCUTANEOUS
  Administered 2023-02-15: 3 [IU] via SUBCUTANEOUS

## 2023-02-11 MED ORDER — FUROSEMIDE 10 MG/ML IJ SOLN
40.0000 mg | Freq: Once | INTRAMUSCULAR | Status: DC
  Filled 2023-02-11: qty 4

## 2023-02-11 MED ORDER — POLYETHYLENE GLYCOL 3350 17 G PO PACK
17.0000 g | PACK | Freq: Every day | ORAL | Status: DC | PRN
  Administered 2023-02-14 – 2023-02-16 (×2): 17 g via ORAL
  Filled 2023-02-11 (×2): qty 1

## 2023-02-11 MED ORDER — SODIUM CHLORIDE 0.9 % IV SOLN
500.0000 mg | INTRAVENOUS | Status: DC
  Administered 2023-02-11: 500 mg via INTRAVENOUS
  Filled 2023-02-11: qty 5

## 2023-02-11 MED ORDER — TECHNETIUM TO 99M ALBUMIN AGGREGATED
3.6000 | Freq: Once | INTRAVENOUS | Status: AC | PRN
  Administered 2023-02-11: 3.6 via INTRAVENOUS

## 2023-02-11 MED ORDER — ACETAMINOPHEN 650 MG RE SUPP: 650.0000 mg | Freq: Four times a day (QID) | RECTAL | Status: DC | PRN

## 2023-02-11 MED ORDER — HEPARIN SODIUM (PORCINE) 5000 UNIT/ML IJ SOLN
5000.0000 [IU] | Freq: Three times a day (TID) | INTRAMUSCULAR | Status: DC
  Administered 2023-02-11 – 2023-02-17 (×17): 5000 [IU] via SUBCUTANEOUS
  Filled 2023-02-11 (×17): qty 1

## 2023-02-11 MED ORDER — PANTOPRAZOLE SODIUM 20 MG PO TBEC
20.0000 mg | DELAYED_RELEASE_TABLET | Freq: Every day | ORAL | Status: DC
  Administered 2023-02-12 – 2023-02-17 (×6): 20 mg via ORAL
  Filled 2023-02-11 (×6): qty 1

## 2023-02-11 MED ORDER — LACTATED RINGERS IV SOLN: INTRAVENOUS | Status: DC

## 2023-02-11 MED ORDER — ALLOPURINOL 100 MG PO TABS
100.0000 mg | ORAL_TABLET | Freq: Two times a day (BID) | ORAL | Status: DC
  Administered 2023-02-11 – 2023-02-17 (×12): 100 mg via ORAL
  Filled 2023-02-11 (×12): qty 1

## 2023-02-11 MED ORDER — LEVETIRACETAM 500 MG PO TABS
500.0000 mg | ORAL_TABLET | Freq: Two times a day (BID) | ORAL | Status: DC
  Administered 2023-02-11 – 2023-02-17 (×12): 500 mg via ORAL
  Filled 2023-02-11 (×12): qty 1

## 2023-02-11 NOTE — H&P (Addendum)
History and Physical   [name] [name] VWU:[id number] DOB: [birth year] DOA: 02/11/2023  PCP: Eloisa Northern, MD   Patient coming from: [facility], assisted living  Chief Complaint: Shortness of breath  HPI: [name] [name] is a 89 y.o. female with medical history significant of hypertension, hyperlipidemia, GERD, CKD 4, hypothyroidism, seizure disorder, diabetes, anemia, RLS, obesity, OSA, chronic respiratory failure, aortic stenosis, diastolic CHF, gout presenting with worsening shortness of breath.  Patient states she is felt generally unwell for about 2 weeks.  But for the past several days she has had worsening shortness of breath.  Today she is being evaluated at her assisted living facility in the and found to be hypoxic to 83% on room air.  X-ray was done there but due to her hypoxia she was sent to the ED for further evaluation.  Patient does report cough and significant dyspnea on exertion which is new for her.  She denies fevers, chills, chest pain, abdominal pain, constipation, diarrhea, nausea, vomiting.  ED Course: Vital signs in the ED notable for blood pressure in the 120s to 130 systolic.  Requiring 4 L to maintain saturations.  Lab workup was significant for CMP with bicarb 15, BUN 60, creatinine of 2.55 which is near baseline of 2 but her creatinine does vary a good bit in recent labs, glucose 246, calcium 8.8, protein 6.2, albumin 3 3.  CBC with hemoglobin stable at 8.2.  PT and INR stably elevated at 17 and 1.4 respectively.  Troponin negative, lactic acid negative.  BNP pending.  Respiratory panel for flu COVID RSV negative.  Urinalysis with glucose, small leukocytes, rare bacteria.  Blood cultures pending.  Chest x-ray showed cardiomegaly with central vascular prominence as well as small left greater than right pleural effusions.  Patient received ceftriaxone and azithromycin in the ED.  Also placed on IV fluids at 150 cc an hour.  VQ scan ordered and is  pending.  Review of Systems: As per HPI otherwise all other systems reviewed and are negative.  Past Medical History:  Diagnosis Date   Anemia, unspecified    Arthritis    "knees; right shoulder" (09/15/2013)   Basal cell carcinoma    "right upper outer lip"   DM neuropathy, type II diabetes mellitus (HCC)    Dyspnea 2021   with low iron   GERD (gastroesophageal reflux disease)    Gout    HCAP (healthcare-associated pneumonia) 01/03/2022   High cholesterol    Hypertension    Hypothyroidism    Meniere's disease    Obesity (BMI 30-39.9)    Other forms of epilepsy and recurrent seizures without mention of intractable epilepsy 11/04/2012   Non convulsive paroxysmal spells, responding to [facility], patient not driving.    Pneumonia ~ 1943   Severe sepsis (HCC) 01/03/2022   Skin cancer    "forehead; right hand"   Stage 4 chronic kidney disease (HCC)    UTI (urinary tract infection)    Vitamin D deficiency     Past Surgical History:  Procedure Laterality Date   BIOPSY  04/05/2020   Procedure: BIOPSY;  Surgeon: Beverley Fiedler, MD;  Location: [facility];  Service: Gastroenterology;;   CERVICAL POLYPECTOMY     COLONOSCOPY WITH PROPOFOL N/A 04/06/2020   Procedure: COLONOSCOPY WITH PROPOFOL;  Surgeon: Beverley Fiedler, MD;  Location: [facility];  Service: Gastroenterology;  Laterality: N/A;   ESOPHAGOGASTRODUODENOSCOPY (EGD) WITH PROPOFOL N/A 04/05/2020   Procedure: ESOPHAGOGASTRODUODENOSCOPY (EGD) WITH PROPOFOL;  Surgeon: Beverley Fiedler, MD;  Location: [facility];  Service: Gastroenterology;  Laterality: N/A;   HAMMER TOE SURGERY Left 1980's   IR FLUORO GUIDE CV LINE RIGHT  01/09/2022   IR PERC TUN PERIT CATH WO PORT S&I /IMAG  01/09/2022   IR REMOVAL TUN CV CATH W/O FL  02/15/2022   IR US GUIDE VASC ACCESS RIGHT  01/09/2022   MOHS SURGERY  20087   "right upper outer lip"   POLYPECTOMY  04/06/2020   Procedure: POLYPECTOMY;  Surgeon: Beverley Fiedler, MD;  Location: [facility];   Service: Gastroenterology;;   TONSILLECTOMY  ~ 1947   WRIST SURGERY Left ~ 1950   "ran arm thru window"    Social History  reports that she quit smoking about 39 years ago. Her smoking use included cigarettes. She started smoking about 69 years ago. She has a 30 pack-year smoking history. She has never used smokeless tobacco. She reports that she does not currently use alcohol. She reports that she does not use drugs.  Allergies  Allergen Reactions   Ppd [Tuberculin Purified Protein Derivative] Other (See Comments)    indurated   Penicillins Other (See Comments)    Unknown     Family History  Problem Relation Age of Onset   Heart disease Mother    Stroke Mother    Heart disease Father    Ovarian cancer Sister    Hypertension Son    Hypertension Son    Diabetes Son    Alcohol abuse Son   Reviewed on admission  Prior to Admission medications   Medication Sig Start Date End Date Taking? Authorizing Provider  acetaminophen (TYLENOL) 500 MG tablet Take 1,000 mg by mouth every 6 (six) hours as needed for moderate pain.   Yes [provider]  allopurinol (ZYLOPRIM) 100 MG tablet Take 1 tablet (100 mg total) by mouth 2 (two) times daily. Take  1 tablet  Daily  to prevent Gout. Patient taking differently: Take 100 mg by mouth 2 (two) times daily. 02/07/21  Yes Raynelle Dick, NP  amLODipine (NORVASC) 5 MG tablet Take 5 mg by mouth every evening. 12/12/22  Yes [provider]  aspirin 81 MG tablet Take 81 mg by mouth in the morning.   Yes [provider]  cholecalciferol (VITAMIN D3) 25 MCG (1000 UNIT) tablet Take 3,000 Units by mouth every evening.   Yes [provider]  dapagliflozin propanediol (FARXIGA) 10 MG TABS tablet Take 1 tablet (10 mg total) by mouth daily before breakfast. Patient taking differently: Take 10 mg by mouth in the morning. 07/02/22  Yes Eloisa Northern, MD  fish oil-omega-3 fatty acids 1000 MG capsule Take 1,000 mg by mouth at  bedtime.   Yes [provider]  furosemide (LASIX) 40 MG tablet Take 40 mg by mouth in the morning.   Yes [provider]  lansoprazole (PREVACID) 30 MG capsule TAKE 1 CAPSULE BY MOUTH DAILY TO PREVENT HEARTBURN AND INDIGESTION. Patient taking differently: Take 30 mg by mouth daily in the afternoon. 12/19/20  Yes Raynelle Dick, NP  levETIRAcetam (KEPPRA) 500 MG tablet Take  1 tablet 2  x /day  with Meals  to Prevent Seizures Patient taking differently: Take 500 mg by mouth 2 (two) times daily. 07/10/21  Yes Lucky Cowboy, MD  levothyroxine (SYNTHROID) 112 MCG tablet Take 112 mcg by mouth in the morning. 12/12/22  Yes [provider]  loratadine (CLARITIN) 10 MG tablet Take 10 mg by mouth in the morning.   Yes [provider]  meclizine (ANTIVERT) 25 MG tablet Take   1 tablet 2 to 3 x /day  ONLY  if needed for Dizziness /Vertigo Patient taking differently: Take 25 mg by mouth every 8 (eight) hours as needed (vertigo). 07/10/21  Yes Lucky Cowboy, MD  melatonin 3 MG TABS tablet Take 3 mg by mouth at bedtime.   Yes [provider]  metoprolol succinate (TOPROL-XL) 25 MG 24 hr tablet Take 0.5 tablets (12.5 mg total) by mouth daily. Patient taking differently: Take 25 mg by mouth in the morning and at bedtime. 05/28/22  Yes Eloisa Northern, MD  ondansetron (ZOFRAN) 4 MG tablet Take 4 mg by mouth every 6 (six) hours as needed for nausea or vomiting.   Yes [provider]  rOPINIRole (REQUIP) 1 MG tablet TAKE ONE-HALF TO ONE TABLET BY MOUTH THREE TIMES DAILY AS NEEDED FOR RESTLESS LEGS& CRAMPS Patient taking differently: Take 0.5 mg by mouth at bedtime. 12/03/21  Yes Raynelle Dick, NP  sitaGLIPtin (JANUVIA) 25 MG tablet Take 50 mg by mouth in the morning.   Yes [provider]  vitamin B-12 (CYANOCOBALAMIN) 100 MCG tablet Take 100 mcg by mouth in the morning.   Yes [provider]  vitamin C (ASCORBIC ACID) 500 MG tablet Take 500  mg by mouth in the morning.   Yes [provider]  sitaGLIPtin (JANUVIA) 100 MG tablet Take 1 tablet (100 mg total) by mouth daily. Patient not taking: Reported on 02/11/2023 07/02/22   Eloisa Northern, MD    Physical Exam: Vitals:   02/11/23 0939 02/11/23 1030 02/11/23 1100 02/11/23 1133  BP:  127/63 121/61 121/65  Pulse:  83 84 85  Resp:  16 18 15   Temp:      TempSrc:      SpO2:  93% 95% 94%  Weight: 75.3 kg     Height: 5\' 2"  (1.575 m)       Physical Exam Constitutional:      General: She is not in acute distress.    Appearance: Normal appearance.  HENT:     Head: Normocephalic and atraumatic.     Mouth/Throat:     Mouth: Mucous membranes are moist.     Pharynx: Oropharynx is clear.  Eyes:     Extraocular Movements: Extraocular movements intact.     Pupils: Pupils are equal, round, and reactive to light.  Cardiovascular:     Rate and Rhythm: Normal rate and regular rhythm.     Pulses: Normal pulses.     Heart sounds: Murmur heard.  Pulmonary:     Effort: Pulmonary effort is normal. No respiratory distress.     Breath sounds: Rales present.  Abdominal:     General: Bowel sounds are normal. There is no distension.     Palpations: Abdomen is soft.     Tenderness: There is no abdominal tenderness.  Musculoskeletal:        General: No swelling or deformity.     Right lower leg: Edema present.     Left lower leg: Edema present.  Skin:    General: Skin is warm and dry.  Neurological:     General: No focal deficit present.     Mental Status: Mental status is at baseline.    Labs on Admission: I have personally reviewed following labs and imaging studies  CBC: Recent Labs  Lab 02/11/23 1013  WBC 6.5  NEUTROABS 5.5  HGB 8.2*  HCT 28.5*  MCV 84.6  PLT 313  Basic Metabolic Panel: Recent Labs  Lab 02/11/23 1013  NA 137  K 3.8  CL 111  CO2 15*  GLUCOSE 246*  BUN 60*  CREATININE 2.55*  CALCIUM 8.8*    GFR: Estimated Creatinine Clearance: 14.5  mL/min (A) (by C-G formula based on SCr of 2.55 mg/dL (H)).  Liver Function Tests: Recent Labs  Lab 02/11/23 1013  AST 14*  ALT 14  ALKPHOS 54  BILITOT 0.7  PROT 6.2*  ALBUMIN 3.3*    Urine analysis:    Component Value Date/Time   COLORURINE STRAW (A) 02/11/2023 0947   APPEARANCEUR CLEAR 02/11/2023 0947   LABSPEC 1.003 (L) 02/11/2023 0947   PHURINE 5.0 02/11/2023 0947   GLUCOSEU >=500 (A) 02/11/2023 0947   HGBUR NEGATIVE 02/11/2023 0947   BILIRUBINUR NEGATIVE 02/11/2023 0947   KETONESUR NEGATIVE 02/11/2023 0947   PROTEINUR NEGATIVE 02/11/2023 0947   UROBILINOGEN 0.2 06/06/2014 0940   NITRITE NEGATIVE 02/11/2023 0947   LEUKOCYTESUR SMALL (A) 02/11/2023 0947    Radiological Exams on Admission: DG Chest Port 1 View Result Date: 02/11/2023 CLINICAL DATA:  Concern for sepsis. EXAM: PORTABLE CHEST 1 VIEW COMPARISON:  Chest radiograph dated February 09, 2022. FINDINGS: Stable cardiomegaly. Aortic atherosclerosis. Mild central vascular prominence. Similar vascular asymmetry of the right hilum. There are small left-greater-than-right bilateral pleural effusions with associated atelectasis. No pneumothorax. No acute osseous abnormality. IMPRESSION: 1. Cardiomegaly with mild central vascular prominence. 2. Small left-greater-than-right bilateral pleural effusions and atelectasis. Electronically Signed   By: Hart Robinsons M.D.   On: 02/11/2023 11:14   EKG: Independently reviewed.  Sinus rhythm at 86 bpm.  Right bundle branch block with QRS 153.  Diffuse T wave abnormality which is similar to previous.  Assessment/Plan Principal Problem:   Acute on chronic respiratory failure with hypoxia (HCC) Active Problems:   Acquired hypothyroidism   CKD stage 4 due to type 2 diabetes mellitus (HCC)   Gastroesophageal reflux disease   Essential hypertension   Seizure disorder (HCC)   Hyperlipidemia associated with type 2 diabetes mellitus (HCC)   OSA on CPAP   Morbid obesity (HCC) BMI 35+  with T2DM, htn, hyperlipidemia, CKD, OSA   Type 2 diabetes mellitus with diabetic chronic kidney disease (HCC)   Anemia of chronic disease   Aortic stenosis   Chronic diastolic CHF (congestive heart failure) (HCC)   Restless leg syndrome   Acute on chronic respiratory failure with hypoxia > Patient with worsening shortness of breath for the past several days after feeling unwell for 2 weeks.  Now also with dyspnea on exertion which is new.  Found to be hypoxic to 83% at facility. > Continued hypoxia in the ED and requiring 4 L to maintain saturations.  Per chart review has had some chronic hypoxia in the past requiring 1 to 2 L. > Unclear etiology.  No leukocytosis and no evidence of pneumonia on chest x-ray so do not suspect bacterial etiology.  Negative for flu COVID and RSV however will need to further rule rule out viral etiology pending results of additional studies.   > Chest x-ray showed cardiomegaly with central vascular prominence and small bilateral pleural effusions.  This may be the primary driver. > Due to somewhat acute onset VQ scan has been obtained and is pending at this time. - Monitor in progressive unit in case oxygen demand escalates - Hold off on further antibiotics - Check procalcitonin and full respiratory viral panel to further evaluate for viral etiology - Follow-up VQ scan and BNP, treat  as appropriate per these results - Hold off on further IV fluids for now ADDENDUM > Normal V/W scan. BNP pending. Considering edema and rales will give dose of IV Lasix. - IV Lasix - Follow-up BNP  Chronic diastolic CHF Chronic RV failure > Last echo was last month with EF 60-65%, G2 DD, moderately reduced RV function. > BNP pending.  Chest x-ray with cardiomegaly and central vascular prominence. - Diuresis pending results of BMP and further workup. - Continue home metoprolol  Hypertension - Continue home amlodipine, metoprolol - Hide versus p.o. Lasix pending results of the  BNP and VQ scan  Hyperlipidemia - Not currently on any antihyperlipidemic medications  GERD - Continue PPI  CKD 4 > Creatinine variable.  Currently at 2.55 which appears to be somewhat elevated from baseline which is closer to 2.  BUN 60.  Bicarb 15 with no gap. > Diuresis plan pending results of BMP. - Trend renal function and electrolytes  Hypothyroidism - Continue Synthroid  Seizure disorder - Continue with Keppra  Diabetes - SSI  Anemia > Hemoglobin stable 8.2 - Trend CBC  RLS - Continue home ropinirole  Gout - Continue home allopurinol  Obesity - Noted  OSA - Continue of CPAP  DVT prophylaxis: Heparin Code Status:   DNR/DNI Family Communication:  None on admission  Disposition Plan:   Patient is from:  [facility], assisted living  Anticipated DC to:  Same as above  Anticipated DC date:  1 to 3 days  Anticipated DC barriers: None  Consults called:  None Admission status:  Observation, progressive  Severity of Illness: The appropriate patient status for this patient is OBSERVATION. Observation status is judged to be reasonable and necessary in order to provide the required intensity of service to ensure the patient's safety. The patient's presenting symptoms, physical exam findings, and initial radiographic and laboratory data in the context of their medical condition is felt to place them at decreased risk for further clinical deterioration. Furthermore, it is anticipated that the patient will be medically stable for discharge from the hospital within 2 midnights of admission.    [name] Fail MD [facility]  How to contact the [facility] Attending or Consulting provider 7A - 7P or covering provider during after hours 7P -7A, for this patient?   Check the care team in [facility] and look for a) attending/consulting [facility] provider listed and b) the [facility] team listed Log into [email or web address] and use [facility] universal password to access. If  you do not have the password, please contact the hospital operator. Locate the [facility] provider you are looking for under [facility] and page to a number that you can be directly reached. If you still have difficulty reaching the provider, please page the [facility] (Director on Call) for the Hospitalists listed on amion for assistance.  02/11/2023, 12:47 PM

## 2023-02-11 NOTE — ED Provider Notes (Signed)
[place] EMERGENCY DEPARTMENT AT [facility] Provider Note   CSN: [id number] Arrival date & time: 02/11/23  6962     History  Chief Complaint  Patient presents with   Shortness of Breath    [name] [name] is a 89 y.o. female.   Shortness of Breath  This patient is a 89 year old female, she has a history of hypothyroidism, seizure disorder on Keppra, hypertension on metoprolol and diabetes on Januvia.  She presents to the hospital with not feeling well for a couple of weeks, for several days she has had increasing shortness of breath and today she was found to be hypoxic to 83% on room air requiring supplemental oxygen.  An x-ray was done at the assisted living where she lives but she was hypoxic so it was determined that she needed to come to the hospital for evaluation.  She does report having a cough with some phlegm but no fevers no vomiting no diarrhea and no swelling of the legs.  She reports when she tries to walk she becomes extremely dyspneic, that is a new finding for her.    Home Medications Prior to Admission medications   Medication Sig Start Date End Date Taking? Authorizing Provider  acetaminophen (TYLENOL) 500 MG tablet Take 1,000 mg by mouth every 6 (six) hours as needed for moderate pain.   Yes [provider]  allopurinol (ZYLOPRIM) 100 MG tablet Take 1 tablet (100 mg total) by mouth 2 (two) times daily. Take  1 tablet  Daily  to prevent Gout. Patient taking differently: Take 100 mg by mouth 2 (two) times daily. 02/07/21  Yes Raynelle Dick, NP  amLODipine (NORVASC) 5 MG tablet Take 5 mg by mouth every evening. 12/12/22  Yes [provider]  aspirin 81 MG tablet Take 81 mg by mouth in the morning.   Yes [provider]  cholecalciferol (VITAMIN D3) 25 MCG (1000 UNIT) tablet Take 3,000 Units by mouth every evening.   Yes [provider]  dapagliflozin propanediol (FARXIGA) 10 MG TABS tablet Take 1 tablet (10 mg  total) by mouth daily before breakfast. Patient taking differently: Take 10 mg by mouth in the morning. 07/02/22  Yes Eloisa Northern, MD  fish oil-omega-3 fatty acids 1000 MG capsule Take 1,000 mg by mouth at bedtime.   Yes [provider]  furosemide (LASIX) 40 MG tablet Take 40 mg by mouth in the morning.   Yes [provider]  lansoprazole (PREVACID) 30 MG capsule TAKE 1 CAPSULE BY MOUTH DAILY TO PREVENT HEARTBURN AND INDIGESTION. Patient taking differently: Take 30 mg by mouth daily in the afternoon. 12/19/20  Yes Raynelle Dick, NP  levETIRAcetam (KEPPRA) 500 MG tablet Take  1 tablet 2  x /day  with Meals  to Prevent Seizures Patient taking differently: Take 500 mg by mouth 2 (two) times daily. 07/10/21  Yes Lucky Cowboy, MD  levothyroxine (SYNTHROID) 112 MCG tablet Take 112 mcg by mouth in the morning. 12/12/22  Yes [provider]  loratadine (CLARITIN) 10 MG tablet Take 10 mg by mouth in the morning.   Yes [provider]  meclizine (ANTIVERT) 25 MG tablet Take   1 tablet 2 to 3 x /day  ONLY  if needed for Dizziness /Vertigo Patient taking differently: Take 25 mg by mouth every 8 (eight) hours as needed (vertigo). 07/10/21  Yes Lucky Cowboy, MD  melatonin 3 MG TABS tablet Take 3 mg by mouth at bedtime.   Yes [provider]  metoprolol succinate (TOPROL-XL) 25 MG 24 hr tablet Take 0.5 tablets (12.5 mg total) by mouth daily. Patient taking differently: Take 25 mg by mouth in the morning and at bedtime. 05/28/22  Yes Eloisa Northern, MD  ondansetron (ZOFRAN) 4 MG tablet Take 4 mg by mouth every 6 (six) hours as needed for nausea or vomiting.   Yes [provider]  rOPINIRole (REQUIP) 1 MG tablet TAKE ONE-HALF TO ONE TABLET BY MOUTH THREE TIMES DAILY AS NEEDED FOR RESTLESS LEGS& CRAMPS Patient taking differently: Take 0.5 mg by mouth at bedtime. 12/03/21  Yes Raynelle Dick, NP  sitaGLIPtin (JANUVIA) 25 MG tablet Take 50 mg by mouth in the  morning.   Yes [provider]  vitamin B-12 (CYANOCOBALAMIN) 100 MCG tablet Take 100 mcg by mouth in the morning.   Yes [provider]  vitamin C (ASCORBIC ACID) 500 MG tablet Take 500 mg by mouth in the morning.   Yes [provider]  cephALEXin (KEFLEX) 500 MG capsule Take 500 mg by mouth 4 (four) times daily. Patient not taking: Reported on 02/11/2023 01/30/23   [provider]  sitaGLIPtin (JANUVIA) 100 MG tablet Take 1 tablet (100 mg total) by mouth daily. Patient not taking: Reported on 02/11/2023 07/02/22   Eloisa Northern, MD      Allergies    Ppd [tuberculin purified protein derivative] and Penicillins    Review of Systems   Review of Systems  Respiratory:  Positive for shortness of breath.   All other systems reviewed and are negative.   Physical Exam Updated Vital Signs BP 121/65   Pulse 85   Temp 97.6 F (36.4 C) (Oral)   Resp 15   Ht 1.575 m (5\' 2" )   Wt 75.3 kg   SpO2 94%   BMI 30.36 kg/m  Physical Exam Vitals and nursing note reviewed.  Constitutional:      Appearance: She is well-developed. Toxic: .milm.  HENT:     Head: Normocephalic and atraumatic.     Mouth/Throat:     Pharynx: No oropharyngeal exudate.  Eyes:     General: No scleral icterus.       Right eye: No discharge.        Left eye: No discharge.     Conjunctiva/sclera: Conjunctivae normal.     Pupils: Pupils are equal, round, and reactive to light.  Neck:     Thyroid: No thyromegaly.     Vascular: No JVD.  Cardiovascular:     Rate and Rhythm: Normal rate and regular rhythm.     Heart sounds: Murmur heard.     No friction rub. No gallop.  Pulmonary:     Effort: Pulmonary effort is normal. No respiratory distress.     Breath sounds: Rales present. No wheezing.  Abdominal:     General: Bowel sounds are normal. There is no distension.     Palpations: Abdomen is soft. There is no mass.     Tenderness: There is no abdominal tenderness.  Musculoskeletal:         General: No tenderness. Normal range of motion.     Cervical back: Normal range of motion and neck supple.     Right lower leg: No edema.     Left lower leg: No edema.  Lymphadenopathy:     Cervical: No cervical adenopathy.  Skin:    General: Skin is warm and dry.     Findings: No erythema or rash.  Neurological:  Mental Status: She is alert.     Coordination: Coordination normal.  Psychiatric:        Behavior: Behavior normal.     ED Results / Procedures / Treatments   Labs (all labs ordered are listed, but only abnormal results are displayed) Labs Reviewed  COMPREHENSIVE METABOLIC PANEL - Abnormal; Notable for the following components:      Result Value   CO2 15 (*)    Glucose, Bld 246 (*)    BUN 60 (*)    Creatinine, Ser 2.55 (*)    Calcium 8.8 (*)    Total Protein 6.2 (*)    Albumin 3.3 (*)    AST 14 (*)    GFR, Estimated 18 (*)    All other components within normal limits  CBC WITH DIFFERENTIAL/PLATELET - Abnormal; Notable for the following components:   RBC 3.37 (*)    Hemoglobin 8.2 (*)    HCT 28.5 (*)    MCH 24.3 (*)    MCHC 28.8 (*)    RDW 22.9 (*)    nRBC 0.3 (*)    All other components within normal limits  PROTIME-INR - Abnormal; Notable for the following components:   Prothrombin Time 17.0 (*)    INR 1.4 (*)    All other components within normal limits  URINALYSIS, W/ REFLEX TO CULTURE (INFECTION SUSPECTED) - Abnormal; Notable for the following components:   Color, Urine STRAW (*)    Specific Gravity, Urine 1.003 (*)    Glucose, UA >=500 (*)    Leukocytes,Ua SMALL (*)    Bacteria, UA RARE (*)    All other components within normal limits  RESP PANEL BY RT-PCR (RSV, FLU A&B, COVID)  RVPGX2  CULTURE, BLOOD (ROUTINE X 2)  CULTURE, BLOOD (ROUTINE X 2)  APTT  BRAIN NATRIURETIC PEPTIDE  I-STAT CG4 LACTIC ACID, ED  TROPONIN I (HIGH SENSITIVITY)    EKG EKG Interpretation Date/Time:  Tuesday February 11 2023 09:44:28 EST Ventricular Rate:   86 PR Interval:  173 QRS Duration:  153 QT Interval:  421 QTC Calculation: 504 R Axis:   174  Text Interpretation: Sinus rhythm Probable left atrial enlargement Right bundle branch block Confirmed by Eber Hong ([id number]) on 02/11/2023 11:26:08 AM  Radiology DG Chest Port 1 View Result Date: 02/11/2023 CLINICAL DATA:  Concern for sepsis. EXAM: PORTABLE CHEST 1 VIEW COMPARISON:  Chest radiograph dated February 09, 2022. FINDINGS: Stable cardiomegaly. Aortic atherosclerosis. Mild central vascular prominence. Similar vascular asymmetry of the right hilum. There are small left-greater-than-right bilateral pleural effusions with associated atelectasis. No pneumothorax. No acute osseous abnormality. IMPRESSION: 1. Cardiomegaly with mild central vascular prominence. 2. Small left-greater-than-right bilateral pleural effusions and atelectasis. Electronically Signed   By: Hart Robinsons M.D.   On: 02/11/2023 11:14    Procedures .Critical Care  Performed by: Eber Hong, MD Authorized by: Eber Hong, MD   Critical care provider statement:    Critical care time (minutes):  30   Critical care time was exclusive of:  Separately billable procedures and treating other patients and teaching time   Critical care was necessary to treat or prevent imminent or life-threatening deterioration of the following conditions:  Respiratory failure   Critical care was time spent personally by me on the following activities:  Development of treatment plan with patient or surrogate, discussions with consultants, evaluation of patient's response to treatment, examination of patient, ordering and review of laboratory studies, ordering and review of radiographic studies, ordering and performing treatments and interventions,  pulse oximetry, re-evaluation of patient's condition, review of old charts and obtaining history from patient or surrogate   I assumed direction of critical care for this patient from another  provider in my specialty: no     Care discussed with: admitting provider   Comments:           Medications Ordered in ED Medications  lactated ringers infusion ( Intravenous New Bag/Given 02/11/23 1022)  cefTRIAXone (ROCEPHIN) 2 g in sodium chloride 0.9 % 100 mL IVPB (0 g Intravenous Stopped 02/11/23 1057)  azithromycin (ZITHROMAX) 500 mg in sodium chloride 0.9 % 250 mL IVPB (0 mg Intravenous Stopped 02/11/23 1134)    ED Course/ Medical Decision Making/ A&P                                 Medical Decision Making Amount and/or Complexity of Data Reviewed Labs: ordered. Radiology: ordered. ECG/medicine tests: ordered.  Risk Prescription drug management. Decision regarding hospitalization.    This patient presents to the ED for concern of shortness of breath, this involves an extensive number of treatment options, and is a complaint that carries with it a high risk of complications and morbidity.  The differential diagnosis includes pneumonia, she does have a history of iron deficiency and anemia, would also consider heart failure, pneumothorax, pulmonary embolism seems less likely given that she is not tachycardic or having chest pain   Co morbidities that complicate the patient evaluation  Hypertension, elderly   Additional history obtained:  Additional history obtained from medical record External records from outside source obtained and reviewed including multiple office visits, she follows with oncology because of myeloproliferative neoplasm and stage IV chronic kidney disease related anemia, she has type 2 diabetes and has been followed by internal medicine, she also had a history of bacteremia from group.  Strep and followed with infectious disease about a year ago.  She was admitted 1 year ago with pneumonia of both lungs and respiratory failure with hypoxia   Lab Tests:  I Ordered, and personally interpreted labs.  The pertinent results include: Renal failure,  close to baseline   Imaging Studies ordered:  I ordered imaging studies including chest x-ray I independently visualized and interpreted imaging which showed infiltrate or small effusion at the left base VQ scan ordered secondary to persistent hypoxia I agree with the radiologist interpretation   Cardiac Monitoring: / EKG:  The patient was maintained on a cardiac monitor.  I personally viewed and interpreted the cardiac monitored which showed an underlying rhythm of: Normal sinus rhythm   Consultations Obtained:  I requested consultation with the hospitalist,  and discussed lab and imaging findings as well as pertinent plan - they recommend: Admission to the hospital with further workup for cause of hypoxia   Problem List / ED Course / Critical interventions / Medication management  The patient does have some anemia though not significantly different from prior, her renal function is also slowly progressive but no significant changes.  The hypoxia was treated with supplemental oxygen, she was given antibiotics for possible pneumonia but ultimately it is not clear whether that is the case or not. I ordered medication including Rocephin and Zithromax for shortness of breath and possible pneumonia Reevaluation of the patient after these medicines showed that the patient no change, she did improve with oxygen to 90% I have reviewed the patients home medicines and have made adjustments as needed  Social Determinants of Health:  Elderly, some heart failure   Test / Admission - Considered:  Admit to higher level of care         Final Clinical Impression(s) / ED Diagnoses Final diagnoses:  Acute respiratory failure with hypoxia [facility])    Rx / DC Orders ED Discharge Orders     None         Eber Hong, MD 02/11/23 1246

## 2023-02-11 NOTE — ED Notes (Signed)
Pt placed on 4L 02 per Lucas

## 2023-02-11 NOTE — ED Triage Notes (Signed)
Pt arrived via [facility] from [facility] for [employer]. Pt's SOB got worse last night. EMS placed pt on 4L 02 per [place] and Sa02 95%. Pt is eupneic at this moment.

## 2023-02-11 NOTE — Sepsis Progress Note (Signed)
Elink following code sepsis °

## 2023-02-11 NOTE — ED Notes (Signed)
ED TO INPATIENT HANDOFF REPORT  ED Nurse Name and Phone #: Murlean Iba Paramedic [phone]  S Name/Age/Gender [name] [name] 89 y.o. female Room/Bed: 030C/030C  Code Status   Code Status: Limited: Do not attempt resuscitation (DNR) -DNR-LIMITED -Do Not Intubate/DNI   Home/SNF/Other Nursing Home Patient oriented to: self, place, time, and situation Is this baseline? Yes   Triage Complete: Triage complete  Chief Complaint Acute on chronic respiratory failure with hypoxia [facility]) [J96.21]  Triage Note Pt arrived via [facility] from [facility] for [employer]. Pt's SOB got worse last night. EMS placed pt on 4L 02 per [place] and Sa02 95%. Pt is eupneic at this moment.   Allergies Allergies  Allergen Reactions   Ppd [Tuberculin Purified Protein Derivative] Other (See Comments)    indurated   Penicillins Other (See Comments)    Unknown     Level of Care/Admitting Diagnosis ED Disposition     ED Disposition  Admit   Condition  --   Comment  Hospital Area: [facility] [[id number]]  Level of Care: Progressive [102]  Admit to Progressive based on following criteria: RESPIRATORY PROBLEMS hypoxemic/hypercapnic respiratory failure that is responsive to NIPPV (BiPAP) or High Flow Nasal Cannula (6-80 lpm). Frequent assessment/intervention, no > Q2 hrs < Q4 hrs, to maintain oxygenation and pulmonary hygiene.  May place patient in observation at [facility] or [facility] if equivalent level of care is available:: No  Covid Evaluation: Asymptomatic - no recent exposure (last 10 days) testing not required  Diagnosis: Acute on chronic respiratory failure with hypoxia [facility]) [[id number]]  Admitting Physician: Synetta Fail [[id number]]  Attending Physician: Synetta Fail [[id number]]          B Medical/Surgery History Past Medical History:  Diagnosis Date   Anemia, unspecified    Arthritis    "knees; right shoulder" (09/15/2013)   Basal cell carcinoma    "right  upper outer lip"   DM neuropathy, type II diabetes mellitus (HCC)    Dyspnea 2021   with low iron   GERD (gastroesophageal reflux disease)    Gout    HCAP (healthcare-associated pneumonia) 01/03/2022   High cholesterol    Hypertension    Hypothyroidism    Meniere's disease    Obesity (BMI 30-39.9)    Other forms of epilepsy and recurrent seizures without mention of intractable epilepsy 11/04/2012   Non convulsive paroxysmal spells, responding to [facility], patient not driving.    Pneumonia ~ 1943   Severe sepsis (HCC) 01/03/2022   Skin cancer    "forehead; right hand"   Stage 4 chronic kidney disease (HCC)    UTI (urinary tract infection)    Vitamin D deficiency    Past Surgical History:  Procedure Laterality Date   BIOPSY  04/05/2020   Procedure: BIOPSY;  Surgeon: Beverley Fiedler, MD;  Location: [facility];  Service: Gastroenterology;;   CERVICAL POLYPECTOMY     COLONOSCOPY WITH PROPOFOL N/A 04/06/2020   Procedure: COLONOSCOPY WITH PROPOFOL;  Surgeon: Beverley Fiedler, MD;  Location: [facility];  Service: Gastroenterology;  Laterality: N/A;   ESOPHAGOGASTRODUODENOSCOPY (EGD) WITH PROPOFOL N/A 04/05/2020   Procedure: ESOPHAGOGASTRODUODENOSCOPY (EGD) WITH PROPOFOL;  Surgeon: Beverley Fiedler, MD;  Location: [facility];  Service: Gastroenterology;  Laterality: N/A;   HAMMER TOE SURGERY Left 1980's   IR FLUORO GUIDE CV LINE RIGHT  01/09/2022   IR PERC TUN PERIT CATH WO PORT S&I /IMAG  01/09/2022   IR REMOVAL TUN CV CATH W/O FL  02/15/2022   IR US GUIDE VASC ACCESS RIGHT  01/09/2022   MOHS SURGERY  20087   "right upper outer lip"   POLYPECTOMY  04/06/2020   Procedure: POLYPECTOMY;  Surgeon: Beverley Fiedler, MD;  Location: [facility];  Service: Gastroenterology;;   TONSILLECTOMY  ~ 1947   WRIST SURGERY Left ~ 1950   "ran arm thru window"     A IV Location/Drains/Wounds Patient Lines/Drains/Airways Status     Active Line/Drains/Airways     Name Placement date Placement time Site  Days   Peripheral IV 02/11/23 20 G 1" Anterior;Right Forearm 02/11/23  1004  Forearm  less than 1   Peripheral IV 02/11/23 20 G 1" Anterior;Distal;Left Forearm 02/11/23  1020  Forearm  less than 1   External Urinary Catheter 02/11/23  1033  --  less than 1            Intake/Output Last 24 hours No intake or output data in the 24 hours ending 02/11/23 1254  Labs/Imaging Results for orders placed or performed during the hospital encounter of 02/11/23 (from the past 48 hours)  Resp panel by RT-PCR (RSV, Flu A&B, Covid) Anterior Nasal Swab     Status: None   Collection Time: 02/11/23  9:46 AM   Specimen: Anterior Nasal Swab  Result Value Ref Range   SARS Coronavirus 2 by RT PCR NEGATIVE NEGATIVE   Influenza A by PCR NEGATIVE NEGATIVE   Influenza B by PCR NEGATIVE NEGATIVE    Comment: (NOTE) The Xpert Xpress SARS-CoV-2/FLU/RSV plus assay is intended as an aid in the diagnosis of influenza from Nasopharyngeal swab specimens and should not be used as a sole basis for treatment. Nasal washings and aspirates are unacceptable for Xpert Xpress SARS-CoV-2/FLU/RSV testing.  Fact Sheet for Patients: BloggerCourse.com  Fact Sheet for Healthcare Providers: SeriousBroker.it  This test is not yet approved or cleared by the [place] FDA and has been authorized for detection and/or diagnosis of SARS-CoV-2 by FDA under an Emergency Use Authorization (EUA). This EUA will remain in effect (meaning this test can be used) for the duration of the COVID-19 declaration under Section 564(b)(1) of the Act, 21 U.S.C. section 360bbb-3(b)(1), unless the authorization is terminated or revoked.     Resp Syncytial Virus by PCR NEGATIVE NEGATIVE    Comment: (NOTE) Fact Sheet for Patients: BloggerCourse.com  Fact Sheet for Healthcare Providers: SeriousBroker.it  This test is not yet approved or  cleared by the [place] FDA and has been authorized for detection and/or diagnosis of SARS-CoV-2 by FDA under an Emergency Use Authorization (EUA). This EUA will remain in effect (meaning this test can be used) for the duration of the COVID-19 declaration under Section 564(b)(1) of the Act, 21 U.S.C. section 360bbb-3(b)(1), unless the authorization is terminated or revoked.  Performed at [facility], [address] [place], [place] [address]   Blood Culture (routine x 2)     Status: None (Preliminary result)   Collection Time: 02/11/23  9:46 AM   Specimen: BLOOD RIGHT FOREARM  Result Value Ref Range   Specimen Description BLOOD RIGHT FOREARM    Special Requests      BOTTLES DRAWN AEROBIC AND ANAEROBIC Blood Culture adequate volume Performed at [facility], [address] [place], [place] [address]    Culture PENDING    Report Status PENDING   Urinalysis, w/ Reflex to Culture (Infection Suspected) -Urine, Clean Catch     Status: Abnormal   Collection Time: 02/11/23  9:47 AM  Result Value Ref Range   Specimen Source URINE, CLEAN CATCH    Color, Urine STRAW (A) YELLOW   APPearance CLEAR CLEAR   Specific Gravity, Urine 1.003 (L) 1.005 - 1.030   pH 5.0 5.0 - 8.0   Glucose, UA >=500 (A) NEGATIVE mg/dL   Hgb urine dipstick NEGATIVE NEGATIVE   Bilirubin Urine NEGATIVE NEGATIVE   Ketones, ur NEGATIVE NEGATIVE mg/dL   Protein, ur NEGATIVE NEGATIVE mg/dL   Nitrite NEGATIVE NEGATIVE   Leukocytes,Ua SMALL (A) NEGATIVE   RBC / HPF 0-5 0 - 5 RBC/hpf   WBC, UA 6-10 0 - 5 WBC/hpf    Comment:        Reflex urine culture not performed if WBC <=10, OR if Squamous epithelial cells >5. If Squamous epithelial cells >5 suggest recollection.    Bacteria, UA RARE (A) NONE SEEN   Squamous Epithelial / HPF 0-5 0 - 5 /HPF   Mucus PRESENT     Comment: Performed at [facility], [address], [place], [place] [address]  Comprehensive metabolic panel     Status: Abnormal    Collection Time: 02/11/23 10:13 AM  Result Value Ref Range   Sodium 137 135 - 145 mmol/L   Potassium 3.8 3.5 - 5.1 mmol/L   Chloride 111 98 - 111 mmol/L   CO2 15 (L) 22 - 32 mmol/L   Glucose, Bld 246 (H) 70 - 99 mg/dL    Comment: Glucose reference range applies only to samples taken after fasting for at least 8 hours.   BUN 60 (H) 8 - 23 mg/dL   Creatinine, Ser 1.30 (H) 0.44 - 1.00 mg/dL   Calcium 8.8 (L) 8.9 - 10.3 mg/dL   Total Protein 6.2 (L) 6.5 - 8.1 g/dL   Albumin 3.3 (L) 3.5 - 5.0 g/dL   AST 14 (L) 15 - 41 U/L   ALT 14 0 - 44 U/L   Alkaline Phosphatase 54 38 - 126 U/L   Total Bilirubin 0.7 <1.2 mg/dL   GFR, Estimated 18 (L) >60 mL/min    Comment: (NOTE) Calculated using the CKD-EPI Creatinine Equation (2021)    Anion gap 11 5 - 15    Comment: Performed at [facility], [address] [place], [place] [address]  CBC with Differential     Status: Abnormal   Collection Time: 02/11/23 10:13 AM  Result Value Ref Range   WBC 6.5 4.0 - 10.5 K/uL   RBC 3.37 (L) 3.87 - 5.11 MIL/uL   Hemoglobin 8.2 (L) 12.0 - 15.0 g/dL   HCT 46.9 (L) 62.9 - 52.8 %   MCV 84.6 80.0 - 100.0 fL   MCH 24.3 (L) 26.0 - 34.0 pg   MCHC 28.8 (L) 30.0 - 36.0 g/dL   RDW 41.3 (H) 24.4 - 01.0 %   Platelets 313 150 - 400 K/uL   nRBC 0.3 (H) 0.0 - 0.2 %   Neutrophils Relative % 85 %   Neutro Abs 5.5 1.7 - 7.7 K/uL   Lymphocytes Relative 10 %   Lymphs Abs 0.7 0.7 - 4.0 K/uL   Monocytes Relative 1 %   Monocytes Absolute 0.1 0.1 - 1.0 K/uL   Eosinophils Relative 3 %   Eosinophils Absolute 0.2 0.0 - 0.5 K/uL   Basophils Relative 1 %   Basophils Absolute 0.1 0.0 - 0.1 K/uL   WBC Morphology See Note     Comment: Morphology unremarkable   Smear Review See Note  Comment: Normal Platelet Morphology   nRBC 0 0 /100 WBC   Abs Immature Granulocytes 0.00 0.00 - 0.07 K/uL   Polychromasia PRESENT    Ovalocytes PRESENT     Comment: Performed at [facility], [address] [place], [place]  [address]  Protime-INR     Status: Abnormal   Collection Time: 02/11/23 10:13 AM  Result Value Ref Range   Prothrombin Time 17.0 (H) 11.4 - 15.2 seconds   INR 1.4 (H) 0.8 - 1.2    Comment: (NOTE) INR goal varies based on device and disease states. Performed at [facility], [address], [place], [place] [address]   APTT     Status: None   Collection Time: 02/11/23 10:13 AM  Result Value Ref Range   aPTT 31 24 - 36 seconds    Comment: Performed at [facility], [address] [place], [place] [address]  Troponin I (High Sensitivity)     Status: None   Collection Time: 02/11/23 10:13 AM  Result Value Ref Range   Troponin I (High Sensitivity) 11 <18 ng/L    Comment: (NOTE) Elevated high sensitivity troponin I (hsTnI) values and significant  changes across serial measurements may suggest ACS but many other  chronic and acute conditions are known to elevate hsTnI results.  Refer to the "Links" section for chest pain algorithms and additional  guidance. Performed at [facility], [address] [place], [place] [address]   I-Stat Lactic Acid, ED     Status: None   Collection Time: 02/11/23 10:20 AM  Result Value Ref Range   Lactic Acid, Venous 0.7 0.5 - 1.9 mmol/L   DG Chest Port 1 View Result Date: 02/11/2023 CLINICAL DATA:  Concern for sepsis. EXAM: PORTABLE CHEST 1 VIEW COMPARISON:  Chest radiograph dated February 09, 2022. FINDINGS: Stable cardiomegaly. Aortic atherosclerosis. Mild central vascular prominence. Similar vascular asymmetry of the right hilum. There are small left-greater-than-right bilateral pleural effusions with associated atelectasis. No pneumothorax. No acute osseous abnormality. IMPRESSION: 1. Cardiomegaly with mild central vascular prominence. 2. Small left-greater-than-right bilateral pleural effusions and atelectasis. Electronically Signed   By: Hart Robinsons M.D.   On: 02/11/2023 11:14    Pending Labs Unresulted Labs (From admission,  onward)     Start     Ordered   02/12/23 0500  Comprehensive metabolic panel  Tomorrow morning,   R        02/11/23 1246   02/12/23 0500  CBC  Tomorrow morning,   R        02/11/23 1246   02/11/23 0947  Brain natriuretic peptide  Once,   URGENT        02/11/23 0946   02/11/23 0946  Blood Culture (routine x 2)  (Septic presentation on arrival (screening labs, nursing and treatment orders for obvious sepsis))  BLOOD CULTURE X 2,   STAT      02/11/23 0946            Vitals/Pain Today's Vitals   02/11/23 0939 02/11/23 1030 02/11/23 1100 02/11/23 1133  BP:  127/63 121/61 121/65  Pulse:  83 84 85  Resp:  16 18 15   Temp:      TempSrc:      SpO2:  93% 95% 94%  Weight: 166 lb 0.1 oz (75.3 kg)     Height: 5\' 2"  (1.575 m)     PainSc:        Isolation Precautions No active isolations  Medications Medications  cefTRIAXone (ROCEPHIN) 2 g in sodium chloride 0.9 % 100 mL IVPB (0 g Intravenous Stopped 02/11/23 1057)  azithromycin (ZITHROMAX) 500 mg in sodium chloride 0.9 % 250 mL IVPB (0 mg Intravenous Stopped 02/11/23 1134)  allopurinol (ZYLOPRIM) tablet 100 mg (has no administration in time range)  metoprolol succinate (TOPROL-XL) 24 hr tablet 12.5 mg (has no administration in time range)  levothyroxine (SYNTHROID) tablet 112 mcg (has no administration in time range)  pantoprazole (PROTONIX) EC tablet 20 mg (has no administration in time range)  melatonin tablet 3 mg (has no administration in time range)  levETIRAcetam (KEPPRA) tablet 500 mg (has no administration in time range)  rOPINIRole (REQUIP) tablet 0.5 mg (has no administration in time range)  heparin injection 5,000 Units (has no administration in time range)  sodium chloride flush (NS) 0.9 % injection 3 mL (has no administration in time range)  acetaminophen (TYLENOL) tablet 650 mg (has no administration in time range)    Or  acetaminophen (TYLENOL) suppository 650 mg (has no administration in time range)  polyethylene  glycol (MIRALAX / GLYCOLAX) packet 17 g (has no administration in time range)  technetium albumin aggregated (MAA) injection solution 3.6 millicurie (has no administration in time range)    Mobility non-ambulatory     Focused Assessments Pulmonary Assessment Handoff:  Lung sounds: Bilateral Breath Sounds: Diminished O2 Device: Nasal Cannula O2 Flow Rate (L/min): 4 L/min    R Recommendations: See Admitting Provider Note  Report given to:   Additional Notes:

## 2023-02-11 NOTE — ED Notes (Signed)
Patient received from ED, patient is alert and oriented, vital signs taken, CCMD notified, CHG bath given, orientation given about the unit, call bell in reach.   02/11/23 1413  Vitals  Temp 97.9 F (36.6 C)  Temp Source Oral  BP (!) 117/47  MAP (mmHg) 66  BP Location Left Arm  BP Method Automatic  Patient Position (if appropriate) Lying  Pulse Rate 82  Pulse Rate Source Monitor  ECG Heart Rate 82  Resp 18  Level of Consciousness  Level of Consciousness Alert  MEWS COLOR  MEWS Score Color Green  Oxygen Therapy  SpO2 93 %  O2 Device Nasal Cannula  O2 Flow Rate (L/min) 3 L/min  Pain Assessment  Pain Scale 0-10  Pain Score 0  Glasgow Coma Scale  Eye Opening 4  Best Verbal Response (NON-intubated) 5  Best Motor Response 6  Glasgow Coma Scale Score 15  MEWS Score  MEWS Temp 0  MEWS Systolic 0  MEWS Pulse 0  MEWS RR 0  MEWS LOC 0  MEWS Score 0

## 2023-02-11 NOTE — Plan of Care (Signed)
  Problem: Health Behavior/Discharge Planning: Goal: Ability to manage health-related needs will improve Outcome: Progressing   Problem: Education: Goal: Knowledge of General Education information will improve Description: Including pain rating scale, medication(s)/side effects and non-pharmacologic comfort measures Outcome: Progressing   Problem: Clinical Measurements: Goal: Will remain free from infection Outcome: Progressing   Problem: Clinical Measurements: Goal: Respiratory complications will improve Outcome: Progressing   Problem: Nutrition: Goal: Adequate nutrition will be maintained Outcome: Progressing   Problem: Pain Management: Goal: General experience of comfort will improve Outcome: Progressing

## 2023-02-11 NOTE — Progress Notes (Signed)
  Echocardiogram 2D Echocardiogram has been performed.  [name] [name] 02/11/2023, 5:53 PM

## 2023-02-12 DIAGNOSIS — D631 Anemia in chronic kidney disease: Secondary | ICD-10-CM | POA: Diagnosis present

## 2023-02-12 DIAGNOSIS — M109 Gout, unspecified: Secondary | ICD-10-CM | POA: Diagnosis present

## 2023-02-12 DIAGNOSIS — Z515 Encounter for palliative care: Secondary | ICD-10-CM | POA: Diagnosis not present

## 2023-02-12 DIAGNOSIS — E1169 Type 2 diabetes mellitus with other specified complication: Secondary | ICD-10-CM | POA: Diagnosis present

## 2023-02-12 DIAGNOSIS — D500 Iron deficiency anemia secondary to blood loss (chronic): Secondary | ICD-10-CM

## 2023-02-12 DIAGNOSIS — D638 Anemia in other chronic diseases classified elsewhere: Secondary | ICD-10-CM

## 2023-02-12 DIAGNOSIS — I272 Pulmonary hypertension, unspecified: Secondary | ICD-10-CM | POA: Diagnosis present

## 2023-02-12 DIAGNOSIS — Z7189 Other specified counseling: Secondary | ICD-10-CM | POA: Diagnosis not present

## 2023-02-12 DIAGNOSIS — Z1152 Encounter for screening for COVID-19: Secondary | ICD-10-CM | POA: Diagnosis not present

## 2023-02-12 DIAGNOSIS — I5030 Unspecified diastolic (congestive) heart failure: Secondary | ICD-10-CM | POA: Diagnosis not present

## 2023-02-12 DIAGNOSIS — I5033 Acute on chronic diastolic (congestive) heart failure: Secondary | ICD-10-CM | POA: Diagnosis present

## 2023-02-12 DIAGNOSIS — D649 Anemia, unspecified: Secondary | ICD-10-CM | POA: Diagnosis not present

## 2023-02-12 DIAGNOSIS — G4733 Obstructive sleep apnea (adult) (pediatric): Secondary | ICD-10-CM | POA: Diagnosis present

## 2023-02-12 DIAGNOSIS — K219 Gastro-esophageal reflux disease without esophagitis: Secondary | ICD-10-CM | POA: Diagnosis present

## 2023-02-12 DIAGNOSIS — N184 Chronic kidney disease, stage 4 (severe): Secondary | ICD-10-CM | POA: Diagnosis present

## 2023-02-12 DIAGNOSIS — I350 Nonrheumatic aortic (valve) stenosis: Secondary | ICD-10-CM | POA: Diagnosis not present

## 2023-02-12 DIAGNOSIS — E1165 Type 2 diabetes mellitus with hyperglycemia: Secondary | ICD-10-CM | POA: Diagnosis present

## 2023-02-12 DIAGNOSIS — G2581 Restless legs syndrome: Secondary | ICD-10-CM | POA: Diagnosis present

## 2023-02-12 DIAGNOSIS — D509 Iron deficiency anemia, unspecified: Secondary | ICD-10-CM | POA: Diagnosis present

## 2023-02-12 DIAGNOSIS — Z9981 Dependence on supplemental oxygen: Secondary | ICD-10-CM | POA: Diagnosis not present

## 2023-02-12 DIAGNOSIS — E1122 Type 2 diabetes mellitus with diabetic chronic kidney disease: Secondary | ICD-10-CM | POA: Diagnosis present

## 2023-02-12 DIAGNOSIS — D471 Chronic myeloproliferative disease: Secondary | ICD-10-CM | POA: Diagnosis not present

## 2023-02-12 DIAGNOSIS — I4510 Unspecified right bundle-branch block: Secondary | ICD-10-CM | POA: Diagnosis present

## 2023-02-12 DIAGNOSIS — I130 Hypertensive heart and chronic kidney disease with heart failure and stage 1 through stage 4 chronic kidney disease, or unspecified chronic kidney disease: Secondary | ICD-10-CM | POA: Diagnosis present

## 2023-02-12 DIAGNOSIS — E039 Hypothyroidism, unspecified: Secondary | ICD-10-CM | POA: Diagnosis present

## 2023-02-12 DIAGNOSIS — I429 Cardiomyopathy, unspecified: Secondary | ICD-10-CM | POA: Diagnosis present

## 2023-02-12 DIAGNOSIS — E1140 Type 2 diabetes mellitus with diabetic neuropathy, unspecified: Secondary | ICD-10-CM | POA: Diagnosis present

## 2023-02-12 DIAGNOSIS — I5031 Acute diastolic (congestive) heart failure: Secondary | ICD-10-CM | POA: Diagnosis not present

## 2023-02-12 DIAGNOSIS — R0602 Shortness of breath: Secondary | ICD-10-CM | POA: Diagnosis present

## 2023-02-12 DIAGNOSIS — J9621 Acute and chronic respiratory failure with hypoxia: Secondary | ICD-10-CM | POA: Diagnosis present

## 2023-02-12 DIAGNOSIS — E7800 Pure hypercholesterolemia, unspecified: Secondary | ICD-10-CM | POA: Diagnosis present

## 2023-02-12 DIAGNOSIS — Z66 Do not resuscitate: Secondary | ICD-10-CM | POA: Diagnosis present

## 2023-02-12 DIAGNOSIS — G40909 Epilepsy, unspecified, not intractable, without status epilepticus: Secondary | ICD-10-CM | POA: Diagnosis present

## 2023-02-12 DIAGNOSIS — N179 Acute kidney failure, unspecified: Secondary | ICD-10-CM | POA: Diagnosis not present

## 2023-02-12 LAB — CBC
HCT: 25 % — ABNORMAL LOW (ref 36.0–46.0)
Hemoglobin: 7.4 g/dL — ABNORMAL LOW (ref 12.0–15.0)
MCH: 24.3 pg — ABNORMAL LOW (ref 26.0–34.0)
MCHC: 29.6 g/dL — ABNORMAL LOW (ref 30.0–36.0)
MCV: 82.2 fL (ref 80.0–100.0)
Platelets: 277 K/uL (ref 150–400)
RBC: 3.04 MIL/uL — ABNORMAL LOW (ref 3.87–5.11)
RDW: 22.7 % — ABNORMAL HIGH (ref 11.5–15.5)
WBC: 5.4 K/uL (ref 4.0–10.5)
nRBC: 0.6 % — ABNORMAL HIGH (ref 0.0–0.2)

## 2023-02-12 LAB — COMPREHENSIVE METABOLIC PANEL
ALT: 12 U/L (ref 0–44)
AST: 11 U/L — ABNORMAL LOW (ref 15–41)
Albumin: 2.8 g/dL — ABNORMAL LOW (ref 3.5–5.0)
Alkaline Phosphatase: 47 U/L (ref 38–126)
Anion gap: 9 (ref 5–15)
BUN: 56 mg/dL — ABNORMAL HIGH (ref 8–23)
CO2: 17 mmol/L — ABNORMAL LOW (ref 22–32)
Calcium: 8.7 mg/dL — ABNORMAL LOW (ref 8.9–10.3)
Chloride: 112 mmol/L — ABNORMAL HIGH (ref 98–111)
Creatinine, Ser: 2.58 mg/dL — ABNORMAL HIGH (ref 0.44–1.00)
GFR, Estimated: 17 mL/min — ABNORMAL LOW (ref >60)
Glucose, Bld: 200 mg/dL — ABNORMAL HIGH (ref 70–99)
Potassium: 3.7 mmol/L (ref 3.5–5.1)
Sodium: 138 mmol/L (ref 135–145)
Total Bilirubin: 0.7 mg/dL (ref <1.2)
Total Protein: 5.5 g/dL — ABNORMAL LOW (ref 6.5–8.1)

## 2023-02-12 LAB — BLOOD GAS, VENOUS
Acid-base deficit: 7.7 mmol/L — ABNORMAL HIGH (ref 0.0–2.0)
Bicarbonate: 16.6 mmol/L — ABNORMAL LOW (ref 20.0–28.0)
Drawn by: 8222
O2 Saturation: 83.6 %
Patient temperature: 37
pCO2, Ven: 30 mm[Hg] — ABNORMAL LOW (ref 44–60)
pH, Ven: 7.35 (ref 7.25–7.43)
pO2, Ven: 54 mm[Hg] — ABNORMAL HIGH (ref 32–45)

## 2023-02-12 LAB — GLUCOSE, CAPILLARY
Glucose-Capillary: 202 mg/dL — ABNORMAL HIGH (ref 70–99)
Glucose-Capillary: 289 mg/dL — ABNORMAL HIGH (ref 70–99)
Glucose-Capillary: 196 mg/dL — ABNORMAL HIGH (ref 70–99)
Glucose-Capillary: 258 mg/dL — ABNORMAL HIGH (ref 70–99)

## 2023-02-12 LAB — LACTATE DEHYDROGENASE: LDH: 308 U/L — ABNORMAL HIGH (ref 98–192)

## 2023-02-12 LAB — PROCALCITONIN: Procalcitonin: <0.1 ng/mL

## 2023-02-12 MED ORDER — FERROUS SULFATE 325 (65 FE) MG PO TABS: 325.0000 mg | ORAL_TABLET | ORAL | Status: DC

## 2023-02-12 MED ORDER — LORATADINE 10 MG PO TABS
10.0000 mg | ORAL_TABLET | Freq: Once | ORAL | Status: AC | PRN
Start: 2023-02-12 — End: 2023-02-12
  Administered 2023-02-12: 10 mg via ORAL
  Filled 2023-02-12: qty 1

## 2023-02-12 MED ORDER — FUROSEMIDE 10 MG/ML IJ SOLN
40.0000 mg | Freq: Once | INTRAMUSCULAR | Status: AC
  Administered 2023-02-12: 40 mg via INTRAVENOUS
  Filled 2023-02-12: qty 4

## 2023-02-12 MED ORDER — SODIUM CHLORIDE 0.9 % IV SOLN
200.0000 mg | Freq: Once | INTRAVENOUS | Status: AC
  Administered 2023-02-12: 200 mg via INTRAVENOUS
  Filled 2023-02-12: qty 10

## 2023-02-12 MED ORDER — METOLAZONE 5 MG PO TABS
2.5000 mg | ORAL_TABLET | Freq: Once | ORAL | Status: AC
  Administered 2023-02-12: 2.5 mg via ORAL
  Filled 2023-02-12: qty 1

## 2023-02-12 MED ORDER — SODIUM CHLORIDE 0.9 % IV SOLN
510.0000 mg | Freq: Once | INTRAVENOUS | Status: DC
Start: 2023-02-12 — End: 2023-02-12

## 2023-02-12 MED ORDER — ACETAMINOPHEN 325 MG PO TABS
650.0000 mg | ORAL_TABLET | Freq: Once | ORAL | Status: AC | PRN
Start: 2023-02-12 — End: 2023-02-12
  Administered 2023-02-12: 650 mg via ORAL
  Filled 2023-02-12: qty 2

## 2023-02-12 MED ORDER — FUROSEMIDE 40 MG PO TABS
40.0000 mg | ORAL_TABLET | Freq: Once | ORAL | Status: AC
  Administered 2023-02-12: 40 mg via ORAL
  Filled 2023-02-12: qty 1

## 2023-02-12 NOTE — Progress Notes (Signed)
Administered tylenol and claritin prior to iron iv administration per order.   Lawson Radar, RN
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
## 2023-02-12 NOTE — Assessment & Plan Note (Signed)
Net -1.3 L yesterday, creatinine slightly better to 2.48 Laying flat this morning - Defer titration of diuretics to cardiology - Consult cardiology, appreciate recommendations - Hold Marcelline Deist - Consult palliative care

## 2023-02-12 NOTE — Progress Notes (Signed)
  Progress Note   Patient: [name] [name] XBM:[id number] DOB: [birth year] DOA: 02/11/2023     0 DOS: the patient was seen and examined on 02/12/2023 at 10:58 AM      Brief hospital course: 89 y.o. F with dCHF, severe AS, pHTN, DM, MPN and Jak2, chronic iron deficiency anemia on iron infusions, CKD IV baseline 2.2, hypothyroidism, seizures, and OSA who presented with SOB.     Assessment and Plan: * Acute on chronic respiratory failure with hypoxia (HCC)    Acute on chronic diastolic CHF (congestive heart failure) (HCC)     Pulmonary hypertension (HCC) - Consult cardiology, appreciate recommendations - Titration of diuretics per cardiology - Consult palliative care -Defer Marcelline Deist to cardiology   Aortic stenosis    Anemia of chronic disease Discussed with hematology.  Low suspicion that she has myelofibrosis contributing to her cytopenia.  She is unfortunately not a candidate for endoscopy to evaluate iron deficiency. - Oral iron supplement    Type 2 diabetes mellitus with diabetic chronic kidney disease (HCC) - Continue sliding scale corrections - Hold Januvia  OSA on CPAP - CPAP at night  Seizure disorder (HCC) No seizures here - Continue home Keppra  Essential hypertension Blood pressure controlled to on the soft side - Hold amlodipine, metoprolol  CKD stage 4 due to type 2 diabetes mellitus (HCC) Creatinine slightly above baseline 2.2, stable from yesterday at 2.58 - Daily BMP while on diuretics  Acquired hypothyroidism - Continue levothyroxine          Subjective: Patient is feeling stable, no new shortness of breath.  No nursing concerns.     Physical Exam: BP (!) 130/55 (BP Location: Left Arm)   Pulse (!) 103   Temp 97.9 F (36.6 C) (Oral)   Resp 18   Ht 5\' 2"  (1.575 m)   Wt 74.5 kg   SpO2 91%   BMI 30.04 kg/m   Adult female, sitting on the edge of the bed, interactive and appropriate RRR, loud holosystolic murmur, no pitting  edema in the ankles, JVP elevated while sitting upright Respiratory rate normal, lung sounds clear without rales or wheezes Abdomen soft no tenderness palpation Attention normal, affect normal, judgment and insight appear normal   Data Reviewed: Discussed with cardiology team Hemoglobin down to 7.4 VQ scan normal Echocardiogram shows grade 2 diastolic dysfunction, severely elevated pulmonary arterial systolic pressure BNP 916 Troponin negative CO2 15-17 Creatinine 2.5-2.58 Procalcitonin and lactate negative  Family Communication: None present    Disposition: Status is: Inpatient         Author: Alberteen Sam, MD 02/12/2023 2:45 PM  For on call review [email or web address].

## 2023-02-12 NOTE — Assessment & Plan Note (Signed)
No seizures here - Continue home Keppra

## 2023-02-12 NOTE — Assessment & Plan Note (Signed)
Continue levothyroxine 

## 2023-02-12 NOTE — Assessment & Plan Note (Signed)
Discussed with hematology.  Low suspicion that she has myelofibrosis contributing to her cytopenia.  She is unfortunately not a candidate for endoscopy to evaluate iron deficiency. - Oral iron supplement

## 2023-02-12 NOTE — Hospital Course (Signed)
89 y.o. F with dCHF, severe AS, pHTN, DM, MPN and Jak2, chronic iron deficiency anemia on iron infusions, CKD IV baseline 2.2, hypothyroidism, seizures, and OSA who presented with SOB.

## 2023-02-12 NOTE — Assessment & Plan Note (Signed)
Blood pressure controlled to on the soft side - Hold amlodipine, metoprolol

## 2023-02-12 NOTE — Assessment & Plan Note (Signed)
Glucose high 100s. - Continue sliding scale corrections - Hold Januvia

## 2023-02-12 NOTE — Assessment & Plan Note (Signed)
Creatinine slightly above baseline 2.2, stable from yesterday at 2.58 - Daily BMP while on diuretics

## 2023-02-12 NOTE — Assessment & Plan Note (Signed)
-   CPAP at night °

## 2023-02-13 ENCOUNTER — Telehealth: Payer: Self-pay

## 2023-02-13 ENCOUNTER — Other Ambulatory Visit: Payer: Self-pay

## 2023-02-13 DIAGNOSIS — I5031 Acute diastolic (congestive) heart failure: Secondary | ICD-10-CM | POA: Diagnosis not present

## 2023-02-13 DIAGNOSIS — Z515 Encounter for palliative care: Secondary | ICD-10-CM | POA: Diagnosis not present

## 2023-02-13 DIAGNOSIS — I350 Nonrheumatic aortic (valve) stenosis: Secondary | ICD-10-CM | POA: Diagnosis not present

## 2023-02-13 DIAGNOSIS — Z7189 Other specified counseling: Secondary | ICD-10-CM

## 2023-02-13 DIAGNOSIS — D471 Chronic myeloproliferative disease: Secondary | ICD-10-CM

## 2023-02-13 DIAGNOSIS — N179 Acute kidney failure, unspecified: Secondary | ICD-10-CM | POA: Diagnosis not present

## 2023-02-13 DIAGNOSIS — D649 Anemia, unspecified: Secondary | ICD-10-CM

## 2023-02-13 DIAGNOSIS — I50811 Acute right heart failure: Secondary | ICD-10-CM

## 2023-02-13 DIAGNOSIS — I272 Pulmonary hypertension, unspecified: Secondary | ICD-10-CM

## 2023-02-13 DIAGNOSIS — J9621 Acute and chronic respiratory failure with hypoxia: Secondary | ICD-10-CM | POA: Diagnosis not present

## 2023-02-13 LAB — BASIC METABOLIC PANEL
Anion gap: 11 (ref 5–15)
BUN: 54 mg/dL — ABNORMAL HIGH (ref 8–23)
CO2: 18 mmol/L — ABNORMAL LOW (ref 22–32)
Calcium: 8.9 mg/dL (ref 8.9–10.3)
Chloride: 108 mmol/L (ref 98–111)
Creatinine, Ser: 2.48 mg/dL — ABNORMAL HIGH (ref 0.44–1.00)
GFR, Estimated: 18 mL/min — ABNORMAL LOW (ref >60)
Glucose, Bld: 195 mg/dL — ABNORMAL HIGH (ref 70–99)
Potassium: 3.6 mmol/L (ref 3.5–5.1)
Sodium: 137 mmol/L (ref 135–145)

## 2023-02-13 LAB — CBC
HCT: 26.3 % — ABNORMAL LOW (ref 36.0–46.0)
Hemoglobin: 7.6 g/dL — ABNORMAL LOW (ref 12.0–15.0)
MCH: 24 pg — ABNORMAL LOW (ref 26.0–34.0)
MCHC: 28.9 g/dL — ABNORMAL LOW (ref 30.0–36.0)
MCV: 83 fL (ref 80.0–100.0)
Platelets: 262 10*3/uL (ref 150–400)
RBC: 3.17 MIL/uL — ABNORMAL LOW (ref 3.87–5.11)
RDW: 22.5 % — ABNORMAL HIGH (ref 11.5–15.5)
WBC: 5 10*3/uL (ref 4.0–10.5)
nRBC: 0.4 % — ABNORMAL HIGH (ref 0.0–0.2)

## 2023-02-13 LAB — GLUCOSE, CAPILLARY
Glucose-Capillary: 180 mg/dL — ABNORMAL HIGH (ref 70–99)
Glucose-Capillary: 325 mg/dL — ABNORMAL HIGH (ref 70–99)
Glucose-Capillary: 166 mg/dL — ABNORMAL HIGH (ref 70–99)
Glucose-Capillary: 219 mg/dL — ABNORMAL HIGH (ref 70–99)

## 2023-02-13 MED ORDER — FUROSEMIDE 10 MG/ML IJ SOLN
80.0000 mg | Freq: Once | INTRAMUSCULAR | Status: AC
  Administered 2023-02-13: 80 mg via INTRAVENOUS
  Filled 2023-02-13: qty 8

## 2023-02-13 NOTE — Progress Notes (Signed)
Mobility Specialist Progress Note:   02/13/23 1132  Mobility  Activity Ambulated with assistance in hallway  Level of Assistance Contact guard assist, steadying assist  Assistive Device Front wheel walker  Distance Ambulated (ft) 45 ft  Activity Response Tolerated well  Mobility Referral Yes  Mobility visit 1 Mobility  Mobility Specialist Start Time (ACUTE ONLY) 1010  Mobility Specialist Stop Time (ACUTE ONLY) 1030  Mobility Specialist Time Calculation (min) (ACUTE ONLY) 20 min   Pre Mobility: 91 HR , 92% SpO2 3 L During Mobility: 113 HR , 81%-85% SpO2 RA- 2 L Post Mobility: 101 HR , 91% SpO2 3 L  Pt received in bed, agreeable to mobility. CG for all OOB activity. Pt desat to 81% on RA and c/o dizziness and SOB. Pursed lip breathing encouraged. SpO2 peaked at 85% on 2 L during ambulation. Pt was able to ambulate back to room without fault. Pt left in bed on 3 L with VSS and all needs met. NT present in room.   Leory Plowman  Mobility Specialist Please contact via [employer] office at [phone]

## 2023-02-13 NOTE — Plan of Care (Signed)
  Problem: Clinical Measurements: Goal: Ability to maintain clinical measurements within normal limits will improve Outcome: Progressing   

## 2023-02-13 NOTE — Consult Note (Signed)
Palliative Medicine Inpatient Consult Note  Consulting Provider: Dr. Maryfrances Bunnell  Reason for consult:  End stage pulmonary hypertension and aortic stenosis  02/13/2023  HPI:  Per intake H&P --> [name] [name] is a 89 y.[name]. female with a hx of aortic stenosis, chronic HFpEF, pulmonary hypertension, JAK2 positive myeloproliferative neoplasm followed by oncology, hypertension, hyperlipidemia, CKD stage IV, hypothyroidism, seizure disorder, diabetes, anemia, & OSA. Palliative care has been asked to address goals of care in the setting of end stage pulmonary hypertension.   Clinical Assessment/Goals of Care:  *Please note that this is a verbal dictation therefore any spelling or grammatical errors are due to the "[employer] One" system interpretation.  I have reviewed medical records including EPIC notes, labs and imaging, received report from bedside RN, assessed the patient who is sitting up at the side of the ned in NAD.    I met with [name] [name] to further discuss diagnosis prognosis, GOC, EOL wishes, disposition and options.   I introduced Palliative Medicine as specialized medical care for people living with serious illness. It focuses on providing relief from the symptoms and stress of a serious illness. The goal is to improve quality of life for both the patient and the family.  Medical History Review and Understanding:  A review of the [name] [name]'s past medical history inclusive of chronic heart failure, severe pulmonary hypertension, aortic stenosis, hypertension, hyperlipidemia, stage IV kidney disease, hypothyroidism, anemia, diabetes, and obstructive sleep apnea was complete.  Social History:  [name] [name] shares that she is from [facility].  She is lived here throughout the duration of her life.  [name] [name] is a widow and has 3 sons.  She formally worked as an Marine scientist and always maintained a level of dizziness.  She shares that she used to enjoy  volunteering in nursing homes and playing piano as well as singing for the residents.  [name] [name] also has an affinity towards sewing though has not been able to do that as of recently.  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  Functional and Nutritional State:  Preceding hospitalization Katera was living at [facility] assisted living facility.  She shares that she moved into the independent living sector about 24 years ago though last year had some health ailments and had to move into the assisted living arm.  She shares that she has been able to bathe and dress herself and does mobilize with a rollator.  She does express that she has had a good appetite over the past few months.  Advance Directives:  A detailed discussion was had today regarding advanced directives.  Pinki does not have advanced directives though is listed in completing those while hospitalized.  I did provide her a copy to review as well as a black ink pen.  Code Status:  Concepts specific to code status, artifical feeding and hydration, continued IV antibiotics and rehospitalization was had.  The difference between a aggressive medical intervention path  and a palliative comfort care path for this patient at this time was had.   Orba shares that she is an established DO NOT RESUSCITATE DO NOT INTUBATE CODE STATUS.  She would not want artificial means to prolong her life.  Discussion:  We reviewed Shereece's understanding of her clinical illness.  She shares with me that she understands she is very fragile kidneys as well as her heart in the setting of her aortic stenosis.  She shares that it has been explained to her if she were to  go about a correction of her aortic stenosis that it would likely set her into fulminant renal failure and she is very much assertive in sharing she does not want to be on any form of dialysis.  [name] [name] feels she is seeing too many people on dialysis  and do poorly.    We discussed that unfortunately the severity of her aortic stenosis, pulmonary hypertension, heart failure and kidney disease will likely result in a shorter period of time living.  [name] [name] is very understanding of this.  We discussed outpatient palliative support versus hospice care.  [name] [name] unfortunately is already contending with hospice care for her son as he had suffered from a metastatic brain tumor and is getting close to the end of his life.  I did briefly review what hospice is. I described hospice as a service for patients who have a life expectancy of 6 months or less. The goal of hospice is the preservation of dignity and quality at the end phases of life. Under hospice care, the focus changes from curative to symptom relief.   I spoke to Palacios about what her wishes are for the future.  She expresses that right now she does not feel ready for hospice and ideally wants to be optimized to go back to [facility].  She is however open to outpatient palliative support to continue following along should she have any additional declines for further conversations about goals.  Discussed the importance of continued conversation with family and their  medical providers regarding overall plan of care and treatment options, ensuring decisions are within the context of the patients values and GOCs.  Decision Maker: Yolinda, Kender (Son): [phone] (Mobile)   SUMMARY OF RECOMMENDATIONS   DNAR/DNI  Appreciate chaplain support helping [name] [name] complete her advance directives  Patient shares a good understanding of the severity of her disease burden and does recognize that her prognosis is limited  Describe the differences between hospice care and palliative care at this point in time [name] [name] is open to outpatient palliative support  Palliative care will continue to follow as needed  Code Status/Advance Care Planning: DNAR/DNI  Palliative Prophylaxis:  Aspiration,  Bowel Regimen, Delirium Protocol, Frequent Pain Assessment, Oral Care, Palliative Wound Care, and Turn Reposition  Additional Recommendations (Limitations, Scope, Preferences): Continue current care  Psycho-social/Spiritual:  Desire for further Chaplaincy support: Yes Additional Recommendations: Education on disease burden   Prognosis: Limited prognosis overall high 11-month mortality risk  Discharge Planning:.  Discharge to [facility] with outpatient palliative support once medically optimized.  Vitals:   02/12/23 1913 02/13/23 0342  BP: (!) 132/45 (!) 117/50  Pulse: 97 86  Resp: 20 20  Temp: (!) 97.3 F (36.3 C) 97.9 F (36.6 C)  SpO2: 92% 92%    Intake/Output Summary (Last 24 hours) at 02/13/2023 6010 Last data filed at 02/12/2023 2300 Gross per 24 hour  Intake 311.42 ml  Output 1650 ml  Net -1338.58 ml   Last Weight  Most recent update: 02/12/2023  4:39 AM    Weight  74.5 kg (164 lb 3.9 oz)            Gen: Elderly Caucasian female in no acute distress HEENT: moist mucous membranes CV: Regular rate and rhythm PULM: On 3 L nasal cannula breathing appears even and nonlabored ABD: soft/nontender EXT: No edema Neuro: Alert and oriented x3  PPS: 50%   This conversation/these recommendations were discussed with patient primary care team, Dr. Maryfrances Bunnell  Billing based on MDM: High  Problems Addressed: One  acute or chronic illness or injury that poses a threat to life or bodily function  Amount and/or Complexity of Data: Category 3:Discussion of management or test interpretation with external physician/other qualified health care professional/appropriate source (not separately reported)  Risks: Decision regarding hospitalization or escalation of hospital care and Decision not to resuscitate or to de-escalate care because of poor prognosis ______________________________________________________ Lamarr Lulas [facility] Palliative Medicine Team Team Cell Phone:  [phone] Please utilize secure chat with additional questions, if there is no response within 30 minutes please call the above phone number  Palliative Medicine Team providers are available by phone from 7am to 7pm daily and can be reached through the team cell phone.  Should this patient require assistance outside of these hours, please call the patient's attending physician.

## 2023-02-13 NOTE — Progress Notes (Signed)
HEMATOLOGY/ONCOLOGY INPATIENT NOTE  Date of Service: 02/12/2023   Patient Care Team: Eloisa Northern, MD as PCP - General (Internal Medicine) Estanislado Emms, MD as Consulting Physician (Internal Medicine) Lajoyce Corners, [facility] (Inactive) as Pharmacist (Pharmacist)  HPI  Was consulted by Dr. Maryfrances Bunnell to follow-up on the patient in the hospital in the context of worsened anemia . [name] [name] is a 89 y.o. female who is well-known to [place] in hematology clinic and sees [place] for her JAK2 positive myeloproliferative neoplasm that has not resulted in significant polycythemia or thrombocytosis and has not required treatment.  On the other hand she has had issues with chronic anemia related to iron deficiency as well as her chronic kidney disease. Her baseline hemoglobins have been in the high 9's and she has required intermittent IV iron replacements and has not yet needed EPO therapy. Patient was admitted to the hospital on 02/11/2023 with worsening fatigue and shortness of breath in the context of her significant valvular heart disease, OSA, chronic respiratory failure on chronic oxygen by nasal cannula and diastolic CHF. She has generally felt unwell at her assisted living place. Her hemoglobin on admission was down to 7.4. She is also being worked up for possible worsening of her respiratory status from respiratory infection, sepsis or decompensated heart failure. I was asked to weigh in about her anemia.  MEDICAL HISTORY:  Past Medical History:  Diagnosis Date   Anemia, unspecified    Arthritis    "knees; right shoulder" (09/15/2013)   Basal cell carcinoma    "right upper outer lip"   DM neuropathy, type II diabetes mellitus (HCC)    Dyspnea 2021   with low iron   GERD (gastroesophageal reflux disease)    Gout    HCAP (healthcare-associated pneumonia) 01/03/2022   High cholesterol    Hypertension    Hypothyroidism    Meniere's disease    Obesity (BMI 30-39.9)    Other forms of epilepsy  and recurrent seizures without mention of intractable epilepsy 11/04/2012   Non convulsive paroxysmal spells, responding to [facility], patient not driving.    Pneumonia ~ 1943   Severe sepsis (HCC) 01/03/2022   Skin cancer    "forehead; right hand"   Stage 4 chronic kidney disease (HCC)    UTI (urinary tract infection)    Vitamin D deficiency     SURGICAL HISTORY: Past Surgical History:  Procedure Laterality Date   BIOPSY  04/05/2020   Procedure: BIOPSY;  Surgeon: Beverley Fiedler, MD;  Location: [facility];  Service: Gastroenterology;;   CERVICAL POLYPECTOMY     COLONOSCOPY WITH PROPOFOL N/A 04/06/2020   Procedure: COLONOSCOPY WITH PROPOFOL;  Surgeon: Beverley Fiedler, MD;  Location: [facility];  Service: Gastroenterology;  Laterality: N/A;   ESOPHAGOGASTRODUODENOSCOPY (EGD) WITH PROPOFOL N/A 04/05/2020   Procedure: ESOPHAGOGASTRODUODENOSCOPY (EGD) WITH PROPOFOL;  Surgeon: Beverley Fiedler, MD;  Location: [facility];  Service: Gastroenterology;  Laterality: N/A;   HAMMER TOE SURGERY Left 1980's   IR FLUORO GUIDE CV LINE RIGHT  01/09/2022   IR PERC TUN PERIT CATH WO PORT S&I /IMAG  01/09/2022   IR REMOVAL TUN CV CATH W/O FL  02/15/2022   IR US GUIDE VASC ACCESS RIGHT  01/09/2022   MOHS SURGERY  20087   "right upper outer lip"   POLYPECTOMY  04/06/2020   Procedure: POLYPECTOMY;  Surgeon: Beverley Fiedler, MD;  Location: [facility];  Service: Gastroenterology;;   TONSILLECTOMY  ~ 1947   WRIST SURGERY Left ~  1950   "ran arm thru window"    SOCIAL HISTORY: Social History   Socioeconomic History   Marital status: Widowed    Spouse name: Not on file   Number of children: 3   Years of education: 43   Highest education level: Not on file  Occupational History   Occupation: retired  Tobacco Use   Smoking status: Former    Current packs/day: 0.00    Average packs/day: 1 pack/day for 30.0 years (30.0 ttl pk-yrs)    Types: Cigarettes    Start date: 06/28/1953    Quit date: 06/29/1983     Years since quitting: 39.6   Smokeless tobacco: Never  Vaping Use   Vaping status: Never Used  Substance and Sexual Activity   Alcohol use: Not Currently   Drug use: No   Sexual activity: Not Currently  Other Topics Concern   Not on file  Social History Narrative   Patient lives at home alone and she is widowed.   Retired.   Education some college.   Right handed.   Caffeine sometimes not daily.    Social Drivers of Corporate investment banker Strain: Not on file  Food Insecurity: No Food Insecurity (02/11/2023)   Hunger Vital Sign    Worried About Running Out of Food in the Last Year: Never true    Ran Out of Food in the Last Year: Never true  Transportation Needs: No Transportation Needs (02/11/2023)   PRAPARE - Administrator, Civil Service (Medical): No    Lack of Transportation (Non-Medical): No  Physical Activity: Not on file  Stress: Not on file  Social Connections: Not on file  Intimate Partner Violence: Unknown (02/11/2023)   Humiliation, Afraid, Rape, and Kick questionnaire    Fear of Current or Ex-Partner: No    Emotionally Abused: Not on file    Physically Abused: No    Sexually Abused: No    FAMILY HISTORY: Family History  Problem Relation Age of Onset   Heart disease Mother    Stroke Mother    Heart disease Father    Ovarian cancer Sister    Hypertension Son    Hypertension Son    Diabetes Son    Alcohol abuse Son     ALLERGIES:  is allergic to ppd [tuberculin purified protein derivative] and penicillins.  MEDICATIONS:  Current Facility-Administered Medications  Medication Dose Route Frequency Provider Last Rate Last Admin   acetaminophen (TYLENOL) tablet 650 mg  650 mg Oral Q6H PRN Synetta Fail, MD       Or   acetaminophen (TYLENOL) suppository 650 mg  650 mg Rectal Q6H PRN Synetta Fail, MD       allopurinol (ZYLOPRIM) tablet 100 mg  100 mg Oral BID Synetta Fail, MD   100 mg at 02/13/23 0912   furosemide  (LASIX) injection 80 mg  80 mg Intravenous Once Quintella Reichert, MD       heparin injection 5,000 Units  5,000 Units Subcutaneous Q8H Synetta Fail, MD   5,000 Units at 02/13/23 0610   insulin aspart (novoLOG) injection 0-9 Units  0-9 Units Subcutaneous TID WC Synetta Fail, MD   2 Units at 02/13/23 1610   levETIRAcetam (KEPPRA) tablet 500 mg  500 mg Oral BID Synetta Fail, MD   500 mg at 02/13/23 9604   levothyroxine (SYNTHROID) tablet 112 mcg  112 mcg Oral q AM Synetta Fail, MD   112 mcg at  02/13/23 0625   melatonin tablet 3 mg  3 mg Oral QHS Synetta Fail, MD   3 mg at 02/12/23 2253   pantoprazole (PROTONIX) EC tablet 20 mg  20 mg Oral Daily Synetta Fail, MD   20 mg at 02/13/23 0912   polyethylene glycol (MIRALAX / GLYCOLAX) packet 17 g  17 g Oral Daily PRN Synetta Fail, MD       rOPINIRole (REQUIP) tablet 0.5 mg  0.5 mg Oral QHS Synetta Fail, MD   0.5 mg at 02/12/23 2253   sodium chloride flush (NS) 0.9 % injection 3 mL  3 mL Intravenous Q12H Synetta Fail, MD   3 mL at 02/13/23 0912    REVIEW OF SYSTEMS:   .10 Point review of Systems was done is negative except as noted above.  PHYSICAL EXAMINATION: ECOG PERFORMANCE STATUS: 2 - Symptomatic, <50% confined to bed  Vitals:   02/13/23 0342 02/13/23 0740  BP: (!) 117/50 (!) 102/35  Pulse: 86 82  Resp: 20 20  Temp: 97.9 F (36.6 C) 97.8 F (36.6 C)  SpO2: 92% 98%    .Body mass index is 30.04 kg/m.  NAD GENERAL:alert, in no acute distress and comfortable SKIN: no acute rashes, no significant lesions EYES: conjunctiva are pink and non-injected, sclera anicteric NECK: supple, no JVD LYMPH:  no palpable lymphadenopathy in the cervical, axillary or inguinal regions LUNGS: clear to auscultation b/l with normal respiratory effort HEART: regular rate & rhythm ABDOMEN:  normoactive bowel sounds , non tender, not distended. No palpable hepato-splenomegaly. Extremity: no pedal  edema PSYCH: alert & oriented x 3 with fluent speech NEURO: no focal motor/sensory deficits  LABORATORY DATA:  I have reviewed the data as listed  .    Latest Ref Rng & Units 02/13/2023    3:00 AM 02/12/2023    3:01 AM 02/11/2023   10:13 AM  CBC  WBC 4.0 - 10.5 K/uL 5.0  5.4  6.5   Hemoglobin 12.0 - 15.0 g/dL 7.6  7.4  8.2   Hematocrit 36.0 - 46.0 % 26.3  25.0  28.5   Platelets 150 - 400 K/uL 262  277  313    . CBC    Component Value Date/Time   WBC 5.0 02/13/2023 0300   RBC 3.17 (L) 02/13/2023 0300   HGB 7.6 (L) 02/13/2023 0300   HGB 9.9 (L) 01/28/2023 0923   HGB 11.5 (L) 10/05/2012 1111   HCT 26.3 (L) 02/13/2023 0300   HCT 34.2 (L) 10/05/2012 1111   PLT 262 02/13/2023 0300   PLT 328 01/28/2023 0923   PLT 258 10/05/2012 1111   MCV 83.0 02/13/2023 0300   MCV 90.1 10/05/2012 1111   MCH 24.0 (L) 02/13/2023 0300   MCHC 28.9 (L) 02/13/2023 0300   RDW 22.5 (H) 02/13/2023 0300   RDW 14.2 10/05/2012 1111   LYMPHSABS 0.7 02/11/2023 1013   LYMPHSABS 1.4 10/05/2012 1111   MONOABS 0.1 02/11/2023 1013   MONOABS 0.5 10/05/2012 1111   EOSABS 0.2 02/11/2023 1013   EOSABS 0.6 (H) 10/05/2012 1111   BASOSABS 0.1 02/11/2023 1013   BASOSABS 0.0 10/05/2012 1111    .    Latest Ref Rng & Units 02/13/2023    3:00 AM 02/12/2023    3:01 AM 02/11/2023   10:13 AM  CMP  Glucose 70 - 99 mg/dL 161  096  045   BUN 8 - 23 mg/dL 54  56  60   Creatinine 0.44 - 1.00 mg/dL  2.48  2.58  2.55   Sodium 135 - 145 mmol/L 137  138  137   Potassium 3.5 - 5.1 mmol/L 3.6  3.7  3.8   Chloride 98 - 111 mmol/L 108  112  111   CO2 22 - 32 mmol/L 18  17  15    Calcium 8.9 - 10.3 mg/dL 8.9  8.7  8.8   Total Protein 6.5 - 8.1 g/dL  5.5  6.2   Total Bilirubin <1.2 mg/dL  0.7  0.7   Alkaline Phos 38 - 126 U/L  47  54   AST 15 - 41 U/L  11  14   ALT 0 - 44 U/L  12  14    . Lab Results  Component Value Date   IRON 22 (L) 01/28/2023   TIBC 252 01/28/2023   IRONPCTSAT 9 (L) 01/28/2023   (Iron and  TIBC)  Lab Results  Component Value Date   FERRITIN 60 01/28/2023   11/26/2019   08/10/2019 Iliac Crest Bone Marrow Report [id number]):   08/10/2019 Flow Pathology [phone]):   08/10/2019 Cytogenetics:    RADIOGRAPHIC STUDIES: I have personally reviewed the radiological images as listed and agreed with the findings in the report. ECHOCARDIOGRAM COMPLETE Result Date: 02/11/2023    ECHOCARDIOGRAM REPORT   Patient Name:   [name] [name] Date of Exam: 02/11/2023 Medical Rec #:  [id number]        Height:       62.0 in Accession #:    [id number]       Weight:       166.0 lb Date of Birth:  07-24-1932       BSA:          1.766 m Patient Age:    89 years         BP:           106/48 mmHg Patient Gender: F                HR:           85 bpm. Exam Location:  Inpatient Procedure: Cardiac Doppler, Color Doppler and 2D Echo Indications:    dyspnea  History:        Patient has prior history of Echocardiogram examinations, most                 recent 01/04/2022. Chronic kidney disease, Aortic Valve Disease;                 Risk Factors:Hypertension, Dyslipidemia, Diabetes and Sleep                 Apnea.  Sonographer:    Delcie Roch RDCS Referring Phys: [id number] Cecille Po MELVIN IMPRESSIONS  1. Left ventricular ejection fraction, by estimation, is 70 to 75%. The left ventricle has hyperdynamic function. The left ventricle has no regional wall motion abnormalities. Left ventricular diastolic parameters are consistent with Grade II diastolic dysfunction (pseudonormalization). There is the interventricular septum is flattened in systole, consistent with right ventricular pressure overload.  2. Right ventricular systolic function is mildly reduced. The right ventricular size is moderately enlarged. Mildly increased right ventricular wall thickness. There is severely elevated pulmonary artery systolic pressure. The estimated right ventricular systolic pressure is 79.9 mmHg.  3. Left atrial size  was mildly dilated.  4. Right atrial size was moderately dilated.  5. The mitral valve is normal in structure. Trivial mitral valve regurgitation. Mild mitral stenosis. Moderate mitral annular calcification.  6. Tricuspid valve regurgitation is moderate.  7. The aortic valve is tricuspid. There is severe calcifcation of the aortic valve. There is severe thickening of the aortic valve. Aortic valve regurgitation is trivial. Severe aortic valve stenosis. Aortic valve mean gradient measures 39.5 mmHg. Aortic valve Vmax measures 4.07 m/s. Aortic valve acceleration time measures 95 msec.  8. The inferior vena cava is dilated in size with >50% respiratory variability, suggesting right atrial pressure of 8 mmHg. Comparison(s): Prior images reviewed side by side. Aortic stenosis gadients are higher, but this is largely due to increased cardiac output. FINDINGS  Left Ventricle: Left ventricular ejection fraction, by estimation, is 70 to 75%. The left ventricle has hyperdynamic function. The left ventricle has no regional wall motion abnormalities. The left ventricular internal cavity size was normal in size. There is borderline concentric left ventricular hypertrophy. The interventricular septum is flattened in systole, consistent with right ventricular pressure overload. Left ventricular diastolic parameters are consistent with Grade II diastolic dysfunction (pseudonormalization). Right Ventricle: The right ventricular size is moderately enlarged. Mildly increased right ventricular wall thickness. Right ventricular systolic function is mildly reduced. There is severely elevated pulmonary artery systolic pressure. The tricuspid regurgitant velocity is 4.24 m/s, and with an assumed right atrial pressure of 8 mmHg, the estimated right ventricular systolic pressure is 79.9 mmHg. Left Atrium: Left atrial size was mildly dilated. Right Atrium: Right atrial size was moderately dilated. Pericardium: There is no evidence of  pericardial effusion. Mitral Valve: The mitral valve is normal in structure. There is mild thickening of the mitral valve leaflet(s). Moderate mitral annular calcification. Trivial mitral valve regurgitation. Mild mitral valve stenosis. MV peak gradient, 14.3 mmHg. The mean mitral valve gradient is 5.8 mmHg with average heart rate of 85 bpm. Tricuspid Valve: The tricuspid valve is normal in structure. Tricuspid valve regurgitation is moderate. Aortic Valve: The aortic valve is tricuspid. There is severe calcifcation of the aortic valve. There is severe thickening of the aortic valve. Aortic valve regurgitation is trivial. Severe aortic stenosis is present. Aortic valve mean gradient measures 39.5 mmHg. Aortic valve peak gradient measures 66.3 mmHg. Aortic valve area, by VTI measures 0.99 cm. Pulmonic Valve: The pulmonic valve was normal in structure. Pulmonic valve regurgitation is trivial. No evidence of pulmonic stenosis. Aorta: The aortic root and ascending aorta are structurally normal, with no evidence of dilitation. Venous: The inferior vena cava is dilated in size with greater than 50% respiratory variability, suggesting right atrial pressure of 8 mmHg. IAS/Shunts: No atrial level shunt detected by color flow Doppler.  LEFT VENTRICLE PLAX 2D LVIDd:         4.00 cm   Diastology LVIDs:         2.10 cm   LV e' medial:    4.79 cm/s LV PW:         1.10 cm   LV E/e' medial:  34.2 LV IVS:        1.00 cm   LV e' lateral:   6.20 cm/s LVOT diam:     1.90 cm   LV E/e' lateral: 26.5 LV SV:         86 LV SV Index:   49 LVOT Area:     2.84 cm  RIGHT VENTRICLE             IVC RV Basal diam:  2.70 cm     IVC diam: 2.10 cm RV S prime:     11.00 cm/s TAPSE (M-mode): 1.6 cm LEFT ATRIUM  Index        RIGHT ATRIUM           Index LA diam:      3.90 cm 2.21 cm/m   RA Area:     16.60 cm LA Vol (A4C): 40.5 ml 22.93 ml/m  RA Volume:   43.80 ml  24.80 ml/m  AORTIC VALVE AV Area (Vmax):    0.84 cm AV Area (Vmean):    0.84 cm AV Area (VTI):     0.99 cm AV Vmax:           407.00 cm/s AV Vmean:          294.000 cm/s AV VTI:            0.874 m AV Peak Grad:      66.3 mmHg AV Mean Grad:      39.5 mmHg LVOT Vmax:         120.84 cm/s LVOT Vmean:        87.399 cm/s LVOT VTI:          0.305 m LVOT/AV VTI ratio: 0.35  AORTA Ao Root diam: 2.70 cm Ao Asc diam:  2.60 cm MITRAL VALVE                TRICUSPID VALVE MV Area (PHT): 3.85 cm     TR Peak grad:   71.9 mmHg MV Peak grad:  14.3 mmHg    TR Vmax:        424.00 cm/s MV Mean grad:  5.8 mmHg MV Vmax:       1.89 m/s     SHUNTS MV Vmean:      134.0 cm/s   Systemic VTI:  0.30 m MV Decel Time: 197 msec     Systemic Diam: 1.90 cm MV E velocity: 164.00 cm/s MV A velocity: 162.00 cm/s MV E/A ratio:  1.01 Mihai Croitoru MD Electronically signed by Thurmon Fair MD Signature Date/Time: 02/11/2023/6:02:30 PM    Final    NM Pulmonary Perfusion Result Date: 02/11/2023 CLINICAL DATA:  Pulmonary embolism suspected, high probability. Shortness of breath. EXAM: NUCLEAR MEDICINE PERFUSION LUNG SCAN TECHNIQUE: Perfusion images were obtained in multiple projections after intravenous injection of radiopharmaceutical. Ventilation scans intentionally deferred if perfusion scan and chest x-ray adequate for interpretation during COVID 19 epidemic. RADIOPHARMACEUTICALS:  3.6 millicuries mCi Tc-70m MAA IV COMPARISON:  Radiography same day FINDINGS: Normal perfusion study.  No defect to suggest pulmonary embolism. IMPRESSION: Normal perfusion study.  No defect to suggest pulmonary embolism. Electronically Signed   By: Paulina Fusi M.D.   On: 02/11/2023 13:10   DG Chest Port 1 View Result Date: 02/11/2023 CLINICAL DATA:  Concern for sepsis. EXAM: PORTABLE CHEST 1 VIEW COMPARISON:  Chest radiograph dated February 09, 2022. FINDINGS: Stable cardiomegaly. Aortic atherosclerosis. Mild central vascular prominence. Similar vascular asymmetry of the right hilum. There are small left-greater-than-right bilateral  pleural effusions with associated atelectasis. No pneumothorax. No acute osseous abnormality. IMPRESSION: 1. Cardiomegaly with mild central vascular prominence. 2. Small left-greater-than-right bilateral pleural effusions and atelectasis. Electronically Signed   By: Hart Robinsons M.D.   On: 02/11/2023 11:14     ASSESSMENT & PLAN:   89 yo with   1) Chronic Normocytic Anemia  Likely related to CKD and functional iron deficiency Could also have a consideration of chronic low-level chronic hemolysis due to her significant aortic stenosis.  2) JAK2 positive MPN -this has not led to any polycythemia or significant thrombocytosis and has never required treatment.  3) BM Bx (08/10/2019)- consistent with MPN NOS--possibly primary myelofibrosis or ET  4) acute on chronic respiratory failure in the context of worsening anemia, severe pulmonary hypertension, significant aortic stenosis and fluid overload.  Also being ruled out for possible respiratory infection.  PLAN: -Lab results and overall clinical status and imaging studies and echo were reviewed with the patient in detail. -I discussed with the patient the multifactorial nature of her anemia. -Would recommend aggressive IV iron replacement to try to maintain ferritin's of 250-500 in the context of her chronic kidney disease, especially given that despite her JAK2 mutation she has not shown a tendency to polycythemia. -Have ordered IV Feraheme 510 mg with Tylenol and loratadine as premedications.  If this is not possible can do Venofer 200 mg every 2 to 3 days for 3-4 doses. -Reasonable to transfuse to hemoglobin of more than 8 in the context of her significant pulmonary and cardiac limitations. -After the patient receives 2 doses of IV iron can start the patient on Aranesp every 4 weeks (will start this as outpatient unless patient remain in the hospital longer) -No indication for bone marrow biopsy at this time -Appreciate input from  hospital medicine and cardiology. -Agree with palliative care consultation in the context of chronic respiratory failure and cardiac valvular issues which cannot be easily fixed.  The total time spent in the appointment was 50 minutes*.  All of the patient's questions were answered with apparent satisfaction. The patient knows to call the clinic with any problems, questions or concerns.   Wyvonnia Lora MD MS AAHIVMS [facility] Hematology/Oncology Physician [facility]  .*Total Encounter Time as defined by the Centers for Medicare and Medicaid Services includes, in addition to the face-to-face time of a patient visit (documented in the note above) non-face-to-face time: obtaining and reviewing outside history, ordering and reviewing medications, tests or procedures, care coordination (communications with other health care professionals or caregivers) and documentation in the medical record.

## 2023-02-13 NOTE — Telephone Encounter (Signed)
Auth Submission: NO AUTH NEEDED Site of care: Site of care: [facility] Payer: [employer]BS Medication & CPT/J Code(s) submitted: Venofer (Iron Sucrose) J1756 Route of submission (phone, fax, portal):  Phone # Fax # Auth type: Buy/Bill PB Units/visits requested: 200mg  x 2 doses Reference number:  Approval from: 02/13/23 to 02/18/23

## 2023-02-13 NOTE — Progress Notes (Addendum)
Progress Note  Patient Name: [name] [name] Date of Encounter: 02/13/2023  [facility] Cardiologist: None   Patient Profile     Subjective   Admitted with shortness of breath with a history of aortic stenosis and pulmonary hypertension on prior echo.  2D echo in 2023 with moderate to severe AS and severe pulmonary hypertension.  Developed worsening shortness of breath over 2-week.  And noted to be hypoxic at her ALF and chest x-ray showed cardiomyopathy with vascular congestion and pleural effusions.  Did not respond to 40 mg IV Lasix.  VQ scan negative for PE.  Serum creatinine elevated at 2.58 albumin 2.8 troponin negative and hemoglobin 7.4.  Yesterday gave metolazone 2.5 mg and 80 mg of Lasix yesterday and put out 1.65 L yesterday and is net -514 cc since admission.  Weight down 2 pounds from yesterday  Inpatient Medications    Scheduled Meds:  allopurinol  100 mg Oral BID   heparin  5,000 Units Subcutaneous Q8H   insulin aspart  0-9 Units Subcutaneous TID WC   levETIRAcetam  500 mg Oral BID   levothyroxine  112 mcg Oral q AM   melatonin  3 mg Oral QHS   pantoprazole  20 mg Oral Daily   rOPINIRole  0.5 mg Oral QHS   sodium chloride flush  3 mL Intravenous Q12H   Continuous Infusions:  PRN Meds: acetaminophen **OR** acetaminophen, polyethylene glycol   Vital Signs    Vitals:   02/12/23 1659 02/12/23 1913 02/13/23 0342 02/13/23 0740  BP:  (!) 132/45 (!) 117/50 (!) 102/35  Pulse:  97 86 82  Resp:  20 20 20   Temp:  (!) 97.3 F (36.3 C) 97.9 F (36.6 C) 97.8 F (36.6 C)  TempSrc:  Oral Oral Oral  SpO2: 93% 92% 92% 98%  Weight:      Height:        Intake/Output Summary (Last 24 hours) at 02/13/2023 0956 Last data filed at 02/12/2023 2300 Gross per 24 hour  Intake 311.42 ml  Output 1650 ml  Net -1338.58 ml      02/12/2023    3:29 AM 02/11/2023    9:39 AM 01/28/2023   10:05 AM  Last 3 Weights  Weight (lbs) 164 lb 3.9 oz 166 lb 0.1 oz 166 lb   Weight (kg) 74.5 kg 75.3 kg 75.297 kg      Telemetry    Normal sinus rhythm- Personally Reviewed  ECG    No New EKG to review- Personally Reviewed  Physical Exam   GEN: No acute distress.   Neck: No JVD Cardiac: RRR, no rubs, or gallops.  Harsh 2/6 systolic murmur at the right upper sternal border to the left lower sternal border Respiratory: Crackles at bases bilaterally GI: Soft, nontender, non-distended  MS: No edema; No deformity. Neuro:  Nonfocal  Psych: Normal affect   Labs    High Sensitivity Troponin:   Recent Labs  Lab 02/11/23 1013  TROPONINIHS 11      Chemistry Recent Labs  Lab 02/11/23 1013 02/12/23 0301 02/13/23 0300  NA 137 138 137  K 3.8 3.7 3.6  CL 111 112* 108  CO2 15* 17* 18*  GLUCOSE 246* 200* 195*  BUN 60* 56* 54*  CREATININE 2.55* 2.58* 2.48*  CALCIUM 8.8* 8.7* 8.9  PROT 6.2* 5.5*  --   ALBUMIN 3.3* 2.8*  --   AST 14* 11*  --   ALT 14 12  --   ALKPHOS 54 47  --  BILITOT 0.7 0.7  --   GFRNONAA 18* 17* 18*  ANIONGAP 11 9 11      Hematology Recent Labs  Lab 02/11/23 1013 02/12/23 0301 02/13/23 0300  WBC 6.5 5.4 5.0  RBC 3.37* 3.04* 3.17*  HGB 8.2* 7.4* 7.6*  HCT 28.5* 25.0* 26.3*  MCV 84.6 82.2 83.0  MCH 24.3* 24.3* 24.0*  MCHC 28.8* 29.6* 28.9*  RDW 22.9* 22.7* 22.5*  PLT 313 277 262    BNP Recent Labs  Lab 02/11/23 1526  BNP 916.9*     DDimer No results for input(s): "DDIMER" in the last 168 hours.     ECHOCARDIOGRAM COMPLETE Result Date: 02/11/2023    ECHOCARDIOGRAM REPORT   Patient Name:   [name] [name] Date of Exam: 02/11/2023 Medical Rec #:  [id number]        Height:       62.0 in Accession #:    [id number]       Weight:       166.0 lb Date of Birth:  10-12-1932       BSA:          1.766 m Patient Age:    89 years         BP:           106/48 mmHg Patient Gender: F                HR:           85 bpm. Exam Location:  Inpatient Procedure: Cardiac Doppler, Color Doppler and 2D Echo Indications:    dyspnea   History:        Patient has prior history of Echocardiogram examinations, most                 recent 01/04/2022. Chronic kidney disease, Aortic Valve Disease;                 Risk Factors:Hypertension, Dyslipidemia, Diabetes and Sleep                 Apnea.  Sonographer:    Delcie Roch RDCS Referring Phys: [id number] [name] [name] IMPRESSIONS  1. Left ventricular ejection fraction, by estimation, is 70 to 75%. The left ventricle has hyperdynamic function. The left ventricle has no regional wall motion abnormalities. Left ventricular diastolic parameters are consistent with Grade II diastolic dysfunction (pseudonormalization). There is the interventricular septum is flattened in systole, consistent with right ventricular pressure overload.  2. Right ventricular systolic function is mildly reduced. The right ventricular size is moderately enlarged. Mildly increased right ventricular wall thickness. There is severely elevated pulmonary artery systolic pressure. The estimated right ventricular systolic pressure is 79.9 mmHg.  3. Left atrial size was mildly dilated.  4. Right atrial size was moderately dilated.  5. The mitral valve is normal in structure. Trivial mitral valve regurgitation. Mild mitral stenosis. Moderate mitral annular calcification.  6. Tricuspid valve regurgitation is moderate.  7. The aortic valve is tricuspid. There is severe calcifcation of the aortic valve. There is severe thickening of the aortic valve. Aortic valve regurgitation is trivial. Severe aortic valve stenosis. Aortic valve mean gradient measures 39.5 mmHg. Aortic valve Vmax measures 4.07 m/s. Aortic valve acceleration time measures 95 msec.  8. The inferior vena cava is dilated in size with >50% respiratory variability, suggesting right atrial pressure of 8 mmHg. Comparison(s): Prior images reviewed side by side. Aortic stenosis gadients are higher, but this is largely due to increased cardiac output. FINDINGS  Left Ventricle:  Left ventricular ejection fraction, by estimation, is 70 to 75%. The left ventricle has hyperdynamic function. The left ventricle has no regional wall motion abnormalities. The left ventricular internal cavity size was normal in size. There is borderline concentric left ventricular hypertrophy. The interventricular septum is flattened in systole, consistent with right ventricular pressure overload. Left ventricular diastolic parameters are consistent with Grade II diastolic dysfunction (pseudonormalization). Right Ventricle: The right ventricular size is moderately enlarged. Mildly increased right ventricular wall thickness. Right ventricular systolic function is mildly reduced. There is severely elevated pulmonary artery systolic pressure. The tricuspid regurgitant velocity is 4.24 m/s, and with an assumed right atrial pressure of 8 mmHg, the estimated right ventricular systolic pressure is 79.9 mmHg. Left Atrium: Left atrial size was mildly dilated. Right Atrium: Right atrial size was moderately dilated. Pericardium: There is no evidence of pericardial effusion. Mitral Valve: The mitral valve is normal in structure. There is mild thickening of the mitral valve leaflet(s). Moderate mitral annular calcification. Trivial mitral valve regurgitation. Mild mitral valve stenosis. MV peak gradient, 14.3 mmHg. The mean mitral valve gradient is 5.8 mmHg with average heart rate of 85 bpm. Tricuspid Valve: The tricuspid valve is normal in structure. Tricuspid valve regurgitation is moderate. Aortic Valve: The aortic valve is tricuspid. There is severe calcifcation of the aortic valve. There is severe thickening of the aortic valve. Aortic valve regurgitation is trivial. Severe aortic stenosis is present. Aortic valve mean gradient measures 39.5 mmHg. Aortic valve peak gradient measures 66.3 mmHg. Aortic valve area, by VTI measures 0.99 cm. Pulmonic Valve: The pulmonic valve was normal in structure. Pulmonic valve  regurgitation is trivial. No evidence of pulmonic stenosis. Aorta: The aortic root and ascending aorta are structurally normal, with no evidence of dilitation. Venous: The inferior vena cava is dilated in size with greater than 50% respiratory variability, suggesting right atrial pressure of 8 mmHg. IAS/Shunts: No atrial level shunt detected by color flow Doppler.  LEFT VENTRICLE PLAX 2D LVIDd:         4.00 cm   Diastology LVIDs:         2.10 cm   LV e' medial:    4.79 cm/s LV PW:         1.10 cm   LV E/e' medial:  34.2 LV IVS:        1.00 cm   LV e' lateral:   6.20 cm/s LVOT diam:     1.90 cm   LV E/e' lateral: 26.5 LV SV:         86 LV SV Index:   49 LVOT Area:     2.84 cm  RIGHT VENTRICLE             IVC RV Basal diam:  2.70 cm     IVC diam: 2.10 cm RV S prime:     11.00 cm/s TAPSE (M-mode): 1.6 cm LEFT ATRIUM           Index        RIGHT ATRIUM           Index LA diam:      3.90 cm 2.21 cm/m   RA Area:     16.60 cm LA Vol (A4C): 40.5 ml 22.93 ml/m  RA Volume:   43.80 ml  24.80 ml/m  AORTIC VALVE AV Area (Vmax):    0.84 cm AV Area (Vmean):   0.84 cm AV Area (VTI):     0.99 cm AV Vmax:  407.00 cm/s AV Vmean:          294.000 cm/s AV VTI:            0.874 m AV Peak Grad:      66.3 mmHg AV Mean Grad:      39.5 mmHg LVOT Vmax:         120.84 cm/s LVOT Vmean:        87.399 cm/s LVOT VTI:          0.305 m LVOT/AV VTI ratio: 0.35  AORTA Ao Root diam: 2.70 cm Ao Asc diam:  2.60 cm MITRAL VALVE                TRICUSPID VALVE MV Area (PHT): 3.85 cm     TR Peak grad:   71.9 mmHg MV Peak grad:  14.3 mmHg    TR Vmax:        424.00 cm/s MV Mean grad:  5.8 mmHg MV Vmax:       1.89 m/s     SHUNTS MV Vmean:      134.0 cm/s   Systemic VTI:  0.30 m MV Decel Time: 197 msec     Systemic Diam: 1.90 cm MV E velocity: 164.00 cm/s MV A velocity: 162.00 cm/s MV E/A ratio:  1.01 Mihai Croitoru MD Electronically signed by Thurmon Fair MD Signature Date/Time: 02/11/2023/6:02:30 PM    Final    NM Pulmonary  Perfusion Result Date: 02/11/2023 CLINICAL DATA:  Pulmonary embolism suspected, high probability. Shortness of breath. EXAM: NUCLEAR MEDICINE PERFUSION LUNG SCAN TECHNIQUE: Perfusion images were obtained in multiple projections after intravenous injection of radiopharmaceutical. Ventilation scans intentionally deferred if perfusion scan and chest x-ray adequate for interpretation during COVID 19 epidemic. RADIOPHARMACEUTICALS:  3.6 millicuries mCi Tc-23m MAA IV COMPARISON:  Radiography same day FINDINGS: Normal perfusion study.  No defect to suggest pulmonary embolism. IMPRESSION: Normal perfusion study.  No defect to suggest pulmonary embolism. Electronically Signed   By: Paulina Fusi M.D.   On: 02/11/2023 13:10   DG Chest Port 1 View Result Date: 02/11/2023 CLINICAL DATA:  Concern for sepsis. EXAM: PORTABLE CHEST 1 VIEW COMPARISON:  Chest radiograph dated February 09, 2022. FINDINGS: Stable cardiomegaly. Aortic atherosclerosis. Mild central vascular prominence. Similar vascular asymmetry of the right hilum. There are small left-greater-than-right bilateral pleural effusions with associated atelectasis. No pneumothorax. No acute osseous abnormality. IMPRESSION: 1. Cardiomegaly with mild central vascular prominence. 2. Small left-greater-than-right bilateral pleural effusions and atelectasis. Electronically Signed   By: Hart Robinsons M.D.   On: 02/11/2023 11:14    Patient Profile     89 y.o. female with a hx of aortic stenosis, chronic HFpEF, pulmonary hypertension, JAK2 positive myeloproliferative neoplasm followed by oncology, hypertension, hyperlipidemia, CKD stage IV, hypothyroidism, seizure disorder, diabetes, anemia, OSA, who is being seen 02/12/2023 for the evaluation of shortness of breath at the request of Dr. Maryfrances Bunnell.   Assessment & Plan    Acute on chronic respiratory failure with hypoxia  This is multifactorial likely due to her worsening anemia maybe secondary to MPN, metabolic  acidosis, and acute diastolic CHF with severe PHTN and AS.  These are all likely to advance and with her advanced age would recommend palliative care consultation.  We discussed in great detail and she would opt for more conservative management.  VQ scan negative   Chronic HFpEF Pulmonary hypertension likely WHO group 3 Severe AS She has hyperdynamic LVEF 70-75%, likely compensating.  Grade 2 diastolic dysfunction with interventricular septum flattening with  mildly reduced RV function and RVSP of 79.9. Severe aortic stenosis with a mean gradient 39.5, prior echocardiogram in November 2023 mean gradient was 36, slowly progressing but stable.  Given that she is very functional could possibly be a TAVR candidate however with preoperative workup in her renal function with no desire of going on dialysis she has voiced again conservative management and deferment of this.  We will continue to try to diurese her and make her more comfortable, this may be challenging given she is preload dependent.   She received 40 mg IV Lasix without much urine output and was increased to 80 mg IV Lasix yesterday putting out 1.65 L and is net -514 cc since admission  Weight down 2 pounds from admission  Beta blocker on hold Is adamant that she would never except dialysis if renal function worsened after workup for TAVR and therefore TAVR is not an option and she is opting for conservative care Serum creatinine slightly improved from 2.58->>2.48 today with diuresis and potassium 3.6 Will keep on Lasix 80 mg IV daily and continue to follow renal function with PRN metolazone if she does not respond to the IV Lasix at higher dose She was on PTA Farxiga which has been held and would not restart Await further outcome of palliative care consult   JAK2 positive myeloproliferative neoplasm Per oncology notes no indications for treatment.   CKD Appears to be around baseline, currently 2.58. Serum creatinine improved to 2.48  today   Anemia In June it was 12.4.  Currently 9.9-8.2-7.4-7.6 today.   May need a transfusion.   OSA On CPAP here, not at home.    Seizure disorder On Keppra 500 mg twice daily   Diabetes Last A1c 7.2%  I spent 30 minutes caring for this patient today face to face, ordering and reviewing labs, reviewing records from hospital records, 2D echo , seeing the patient, documenting in the record     For questions or updates, please contact  [facility] Please consult [email or web address] for contact info under        Signed, Armanda Magic, MD  02/13/2023, 9:56 AM

## 2023-02-13 NOTE — Progress Notes (Signed)
  Progress Note   Patient: [name] [name] ONG:[id number] DOB: [birth year] DOA: 02/11/2023     1 DOS: the patient was seen and examined on 02/13/2023 at 9:42 AM      Brief hospital course: 89 y.o. F with dCHF, severe AS, pHTN, DM, MPN and Jak2, chronic iron deficiency anemia on iron infusions, CKD IV baseline 2.2, hypothyroidism, seizures, and OSA who presented with CHF flare in the setting of advanced AS and pHTN.     Assessment and Plan: * Acute on chronic respiratory failure with hypoxia (HCC)    Acute on chronic diastolic CHF (congestive heart failure) (HCC)     Pulmonary hypertension (HCC) Net -1.3 L yesterday, creatinine slightly better to 2.48 Laying flat this morning - Defer titration of diuretics to cardiology - Consult cardiology, appreciate recommendations - Hold Farxiga - Consult palliative care    Aortic stenosis    Anemia of chronic disease Discussed with hematology, baseline hemoglobin is around 9, recent 12 is likely an outlier.  Etiology of anemia is iron deficiency and anemia of CKD.  She does have an MPN from her JAK2 mutation, but no significant polycythemia or thrombocytosis, and no signs of JAK2 mutation driven myelofibrosis.  She is unfortunately not a candidate for endoscopy to evaluate iron deficiency.  IV iron given.  Hemoglobin stable.  Normal bilirubin militates against hemolysis from her valve - Follow haptoglobin - For now hemoglobin threshold for transfusion 7 g/dL  Type 2 diabetes mellitus with diabetic chronic kidney disease (HCC) Glucose high 100s. - Continue sliding scale corrections - Hold Januvia  OSA on CPAP - CPAP at night  Seizure disorder (HCC) No seizures here - Continue home Keppra  Essential hypertension Blood pressure controlled to on the soft side - Hold amlodipine, metoprolol  CKD stage 4 due to type 2 diabetes mellitus (HCC) Baseline creatinine 2.2.  Creatinine today improved to 2.48 - Daily BMP while on  diuretics  Acquired hypothyroidism - Continue levothyroxine          Subjective: Patient is lying flat in bed, feels that her breathing is slightly better.  She has had no new fever, sputum, respiratory distress, fever.     Physical Exam: BP (!) 102/35 (BP Location: Left Arm)   Pulse 82   Temp 97.8 F (36.6 C) (Oral)   Resp 20   Ht 5\' 2"  (1.575 m)   Wt 74.5 kg   SpO2 98%   BMI 30.04 kg/m   Elderly adult female, lying flat in bed, interactive and appropriate, arouses easily and answers questions appropriately RRR, loud holosystolic murmur, no pitting edema, JVP looks better Respiratory rate normal, lungs clear without rales or wheezes Abdomen soft, no tenderness to palpation Attention normal, affect normal, judgment and insight appear normal    Data Reviewed: Discussed with palliative care team Basic metabolic panel shows slightly improved creatinine and bicarb Hemoglobin 7.6, no significant change Haptoglobin pending   Family Communication: None present    Disposition: Status is: Inpatient         Author: Alberteen Sam, MD 02/13/2023 11:31 AM  For on call review [email or web address].

## 2023-02-13 NOTE — Addendum Note (Signed)
Addended by: Ihor Austin D on: 02/13/2023 01:12 PM   Modules accepted: Orders

## 2023-02-14 DIAGNOSIS — J9621 Acute and chronic respiratory failure with hypoxia: Secondary | ICD-10-CM | POA: Diagnosis not present

## 2023-02-14 DIAGNOSIS — I272 Pulmonary hypertension, unspecified: Secondary | ICD-10-CM | POA: Diagnosis not present

## 2023-02-14 DIAGNOSIS — I5030 Unspecified diastolic (congestive) heart failure: Secondary | ICD-10-CM | POA: Diagnosis not present

## 2023-02-14 DIAGNOSIS — I350 Nonrheumatic aortic (valve) stenosis: Secondary | ICD-10-CM | POA: Diagnosis not present

## 2023-02-14 LAB — BASIC METABOLIC PANEL
Anion gap: 9 (ref 5–15)
BUN: 49 mg/dL — ABNORMAL HIGH (ref 8–23)
CO2: 20 mmol/L — ABNORMAL LOW (ref 22–32)
Calcium: 9.2 mg/dL (ref 8.9–10.3)
Chloride: 108 mmol/L (ref 98–111)
Creatinine, Ser: 2.4 mg/dL — ABNORMAL HIGH (ref 0.44–1.00)
GFR, Estimated: 19 mL/min — ABNORMAL LOW (ref >60)
Glucose, Bld: 184 mg/dL — ABNORMAL HIGH (ref 70–99)
Potassium: 3.6 mmol/L (ref 3.5–5.1)
Sodium: 137 mmol/L (ref 135–145)

## 2023-02-14 LAB — CBC
HCT: 26.1 % — ABNORMAL LOW (ref 36.0–46.0)
Hemoglobin: 7.5 g/dL — ABNORMAL LOW (ref 12.0–15.0)
MCH: 23.9 pg — ABNORMAL LOW (ref 26.0–34.0)
MCHC: 28.7 g/dL — ABNORMAL LOW (ref 30.0–36.0)
MCV: 83.1 fL (ref 80.0–100.0)
Platelets: 244 10*3/uL (ref 150–400)
RBC: 3.14 MIL/uL — ABNORMAL LOW (ref 3.87–5.11)
RDW: 22.5 % — ABNORMAL HIGH (ref 11.5–15.5)
WBC: 3.9 10*3/uL — ABNORMAL LOW (ref 4.0–10.5)
nRBC: 0.8 % — ABNORMAL HIGH (ref 0.0–0.2)

## 2023-02-14 LAB — GLUCOSE, CAPILLARY
Glucose-Capillary: 182 mg/dL — ABNORMAL HIGH (ref 70–99)
Glucose-Capillary: 299 mg/dL — ABNORMAL HIGH (ref 70–99)
Glucose-Capillary: 164 mg/dL — ABNORMAL HIGH (ref 70–99)
Glucose-Capillary: 260 mg/dL — ABNORMAL HIGH (ref 70–99)

## 2023-02-14 LAB — HAPTOGLOBIN: Haptoglobin: 79 mg/dL (ref 41–333)

## 2023-02-14 MED ORDER — FUROSEMIDE 10 MG/ML IJ SOLN
80.0000 mg | Freq: Every day | INTRAMUSCULAR | Status: DC
  Administered 2023-02-14 – 2023-02-16 (×3): 80 mg via INTRAVENOUS
  Filled 2023-02-14 (×3): qty 8

## 2023-02-14 MED ORDER — FERROUS SULFATE 325 (65 FE) MG PO TABS
325.0000 mg | ORAL_TABLET | ORAL | Status: DC
  Administered 2023-02-15 – 2023-02-17 (×2): 325 mg via ORAL
  Filled 2023-02-14 (×2): qty 1

## 2023-02-14 NOTE — Progress Notes (Signed)
This chaplain responded to PMT NP-Michelle consult for creating/updating the Pt. Advance Directive.  The Pt. is awake at the time of the visit and familiar with an Advance Directive.  The chaplain understands from the Pt. she has documentation of her HCPOA, son-David, at [facility]. The Pt. plans to check if the AD documentation at [facility] is notarized before starting a new document during this admission.   The chaplain provided education on the importance of uploading the AD into Epic. The chaplain understands the Pt. will page the chaplain through the RN if she wants to pursue completing an AD in the hospital.  This chaplain is available for F/U spiritual care as needed.  Chaplain Stephanie Acre [phone]

## 2023-02-14 NOTE — Progress Notes (Signed)
   02/14/23 2308  BiPAP/CPAP/SIPAP  Reason BIPAP/CPAP not in use Non-compliant   Pt refused CPAP for nighttime use.

## 2023-02-14 NOTE — Evaluation (Signed)
Physical Therapy Evaluation Patient Details Name: [name] [name] MRN: [id number] DOB: 09-23-1932 Today's Date: 02/14/2023  History of Present Illness  Pt is an 89 y.o. female admitted 12/24 for acute on chronic respiratory failure with hypoxia. PMH:  HTN, hyperlipidemia, GERD, CKD 4, hypothyroidism, seizure disorder, DM, anemia, RLS, obesity, OSA, chronic respiratory failure, aortic stenosis, diastolic CHF, gout   Clinical Impression  Pt admitted with above diagnosis. PTA pt lived at [facility], mod I mobility with rollator. No home O2 at baseline. Pt currently with functional limitations due to the deficits listed below (see PT Problem List). On eval, she required supervision bed mobility, CGA transfers, and CGA amb 100' with RW. Mobilized on 2L with short period of desat to 88% at end of gait trial. She presented with tenuous, guarded gait pattern and c/o dizziness. HR max 123. Pt will benefit from acute skilled PT to increase their independence and safety with mobility to allow discharge. Upon d/c, pt would benefit from HHPT at ALF.           If plan is discharge home, recommend the following: Assistance with cooking/housework;A little help with walking and/or transfers;A little help with bathing/dressing/bathroom   Can travel by private vehicle        Equipment Recommendations Other (comment) (home O2)  Recommendations for Other Services       Functional Status Assessment Patient has had a recent decline in their functional status and demonstrates the ability to make significant improvements in function in a reasonable and predictable amount of time.     Precautions / Restrictions Precautions Precautions: Other (comment);Fall Precaution Comments: watch vitals      Mobility  Bed Mobility Overal bed mobility: Needs Assistance Bed Mobility: Supine to Sit, Sit to Supine     Supine to sit: Supervision, Used rails, HOB elevated Sit to supine: Supervision, HOB elevated,  Used rails   General bed mobility comments: increased time    Transfers Overall transfer level: Needs assistance Equipment used: Rolling walker (2 wheels) Transfers: Sit to/from Stand Sit to Stand: Contact guard assist                Ambulation/Gait Ambulation/Gait assistance: Contact guard assist Gait Distance (Feet): 100 Feet Assistive device: Rolling walker (2 wheels) Gait Pattern/deviations: Step-through pattern, Decreased stride length Gait velocity: decreased     General Gait Details: tenuous, guarded gait. Amb on 2L. Maintained SpO2 in 90s until last 26' when she desat to 88%. HR max 123. Pt with c/o dizziness.  Stairs            Wheelchair Mobility     Tilt Bed    Modified Rankin (Stroke Patients Only)       Balance Overall balance assessment: Needs assistance Sitting-balance support: No upper extremity supported, Feet supported Sitting balance-Leahy Scale: Good     Standing balance support: During functional activity, Reliant on assistive device for balance, Bilateral upper extremity supported Standing balance-Leahy Scale: Poor                               Pertinent Vitals/Pain Pain Assessment Pain Assessment: No/denies pain    Home Living Family/patient expects to be discharged to:: Assisted living                 Home Equipment: Rollator (4 wheels);Rolling Walker (2 wheels);Grab bars - tub/shower;Grab bars - toilet;Shower seat      Prior Function Prior Level of Function :  Independent/Modified Independent             Mobility Comments: rollator. Amb to dining room for meals. ADLs Comments: mod I     Extremity/Trunk Assessment   Upper Extremity Assessment Upper Extremity Assessment: Generalized weakness    Lower Extremity Assessment Lower Extremity Assessment: Generalized weakness    Cervical / Trunk Assessment Cervical / Trunk Assessment: Normal  Communication   Communication Communication:  Hearing impairment  Cognition Arousal: Alert Behavior During Therapy: WFL for tasks assessed/performed Overall Cognitive Status: Within Functional Limits for tasks assessed                                          General Comments General comments (skin integrity, edema, etc.): SpO2 93% at rest on 1L. Mobilized on 2L with short period of desat to 88%. Quick recovery to 96% on 2L at rest then returned to 1L.    Exercises     Assessment/Plan    PT Assessment Patient needs continued PT services  PT Problem List Decreased strength;Decreased balance;Cardiopulmonary status limiting activity;Decreased mobility;Decreased activity tolerance       PT Treatment Interventions Functional mobility training;Balance training;Patient/family education;Gait training;Therapeutic activities;Therapeutic exercise    PT Goals (Current goals can be found in the Care Plan section)  Acute Rehab PT Goals Patient Stated Goal: walk to dining room for meals at [facility] PT Goal Formulation: With patient Time For Goal Achievement: 02/28/23 Potential to Achieve Goals: Fair    Frequency Min 1X/week     Co-evaluation               AM-PAC PT "6 Clicks" Mobility  Outcome Measure Help needed turning from your back to your side while in a flat bed without using bedrails?: None Help needed moving from lying on your back to sitting on the side of a flat bed without using bedrails?: A Little Help needed moving to and from a bed to a chair (including a wheelchair)?: A Little Help needed standing up from a chair using your arms (e.g., wheelchair or bedside chair)?: A Little Help needed to walk in hospital room?: A Little Help needed climbing 3-5 steps with a railing? : A Lot 6 Click Score: 18    End of Session Equipment Utilized During Treatment: Gait belt;Oxygen Activity Tolerance: Patient tolerated treatment well Patient left: in bed;with call bell/phone within reach Nurse  Communication: Mobility status PT Visit Diagnosis: Difficulty in walking, not elsewhere classified (R26.2);Muscle weakness (generalized) (M62.81)    Time: 6295-2841 PT Time Calculation (min) (ACUTE ONLY): 14 min   Charges:   PT Evaluation $PT Eval Moderate Complexity: 1 Mod   PT General Charges $$ ACUTE PT VISIT: 1 Visit         Ferd Glassing., PT  Office # [phone]   Ilda Foil 02/14/2023, 12:12 PM

## 2023-02-14 NOTE — Inpatient Diabetes Management (Signed)
Inpatient Diabetes Program Recommendations  AACE/ADA: New Consensus Statement on Inpatient Glycemic Control (2015)  Target Ranges:  Prepandial:   less than 140 mg/dL      Peak postprandial:   less than 180 mg/dL (1-2 hours)      Critically ill patients:  140 - 180 mg/dL   Lab Results  Component Value Date   GLUCAP 299 (H) 02/14/2023   HGBA1C 7.2 (H) 04/25/2022    Review of Glycemic Control  Latest Reference Range & Units 02/13/23 06:11 02/13/23 12:24 02/13/23 16:48 02/13/23 20:57 02/14/23 05:53 02/14/23 11:21  Glucose-Capillary 70 - 99 mg/dL 161 (H) 096 (H) 045 (H) 219 (H) 182 (H) 299 (H)  (H): Data is abnormally high  Latest Reference Range & Units 02/14/23 03:48  GFR, Estimated >60 mL/min 19 (L)  (L): Data is abnormally low  Diabetes history: DM2 Outpatient Diabetes medications: Januvia 50 mg every day, Farxiga 10 mg QD Current orders for Inpatient glycemic control: Novolog 0-9 units TID  Inpatient Diabetes Program Recommendations:    Might consider:  Novolog 2 units TID with meals if she consumes at least 50%  Will continue to follow while inpatient.  Thank you, Dulce Sellar, MSN, CDCES Diabetes Coordinator Inpatient Diabetes Program [phone] (team pager from 8a-5p)

## 2023-02-14 NOTE — Progress Notes (Addendum)
Patient Name: [name] [name] Date of Encounter: 02/14/2023 [facility] Cardiologist: None   Interval Summary  .    Feels that her shortness of breath is improving however had significant DOE when ambulating the halls yesterday.  Still needs further diuresis.  Otherwise no other acute events.  Vital Signs .    Vitals:   02/13/23 1650 02/13/23 1921 02/13/23 2304 02/14/23 0421  BP: (!) 142/77 (!) 148/49 (!) 100/43 (!) 123/49  Pulse:  (!) 106 92 86  Resp: 19 20 20 20   Temp: (!) 97.4 F (36.3 C) (!) 97.4 F (36.3 C) 97.7 F (36.5 C) 97.6 F (36.4 C)  TempSrc: Oral Oral Oral Oral  SpO2:  95% 97% 90%  Weight:      Height:        Intake/Output Summary (Last 24 hours) at 02/14/2023 0817 Last data filed at 02/14/2023 0421 Gross per 24 hour  Intake --  Output 2250 ml  Net -2250 ml      02/12/2023    3:29 AM 02/11/2023    9:39 AM 01/28/2023   10:05 AM  Last 3 Weights  Weight (lbs) 164 lb 3.9 oz 166 lb 0.1 oz 166 lb  Weight (kg) 74.5 kg 75.3 kg 75.297 kg      Telemetry/ECG    Sinus rhythm heart rates 80-100 and- Personally Reviewed  CV Studies    Echocardiogram 02/11/2023 1. Left ventricular ejection fraction, by estimation, is 60 to 65%. The  left ventricle has normal function. The left ventricle has no regional  wall motion abnormalities. There is moderate concentric left ventricular  hypertrophy. Left ventricular  diastolic parameters are consistent with Grade II diastolic dysfunction  (pseudonormalization). Elevated left ventricular end-diastolic pressure.  There is the interventricular septum is flattened in systole, consistent  with right ventricular pressure  overload.   2. Right ventricular systolic function is normal. The right ventricular  size is normal. There is severely elevated pulmonary artery systolic  pressure.   3. Right atrial size was mildly dilated.   4. The mitral valve is grossly normal. Trivial mitral valve  regurgitation. No  evidence of mitral stenosis.   5. DI 0.31, suggests moderate stenosis. The aortic valve is calcified.  There is moderate calcification of the aortic valve. There is moderate  thickening of the aortic valve. Aortic valve regurgitation is not  visualized. Moderate aortic valve stenosis.   6. The inferior vena cava is dilated in size with <50% respiratory  variability, suggesting right atrial pressure of 15 mmHg.   Comparison(s): Changes from prior study are noted. Grade 2 diastolic  dysfunction with elevated LVEDP and elevated RVSP, moderate aortic  stenosis.   Physical Exam .   GEN: No acute distress.   Neck: + JVD Cardiac: RRR, no murmurs, rubs, or gallops.  Respiratory: Positive crackles GI: Soft, nontender, non-distended  MS: Mild edema  Patient Profile    [name] [name] is a 89 y.o. female has hx of  aortic stenosis, chronic HFpEF, pulmonary hypertension, JAK2 positive myeloproliferative neoplasm followed by oncology, hypertension, hyperlipidemia, CKD stage IV, hypothyroidism, seizure disorder, diabetes, anemia, OSA,  and admitted on 02/12/2019 for for the evaluation of shortness of breath.  Assessment & Plan .     Acute on chronic respiratory failure with hypoxia  This is multifactorial likely due to her worsening anemia maybe secondary to MPN, metabolic acidosis, severe valvular disease with pulmonary hypertension.  These are all likely to advance and with her advanced age would recommend  palliative care consultation.  We discussed in great detail and she would opt for more conservative management.  VQ scan negative   Chronic HFpEF Pulmonary hypertension likely WHO group 3 Severe AS She has hyperdynamic LVEF 70-75%, likely compensating.  Grade 2 diastolic dysfunction with interventricular septum flattening with mildly reduced RV function and RVSP of 79.9. Severe aortic stenosis with a mean gradient 39.5, prior echocardiogram in November 2023 mean gradient was 36, slowly  progressing but stable.  Given that she is very functional could possibly be a TAVR candidate however with preoperative workup in her renal function with no desire of going on dialysis she has voiced again conservative management and deferment of this.  We will continue to try to diurese her and make her more comfortable, this may be challenging given she is preload dependent.     She has been diuresing well off of IV Lasix 80 mg daily.  Will continue this, could consider switching to twice daily however given preload dependency likely will continue Lasix to maintain daily dosing since she is diuresing well.  She is negative -2.2 L in the last 24 hours. Metolazone 2.5 mg was given 12/25, she responded well to this can consider another dose of IV Lasix quits working. Will hold her Toprol-XL 12.5mg  and [place].    JAK2 positive myeloproliferative neoplasm Per oncology notes no indications for treatment.    CKD Appears to be around baseline, 2.58 on admission.  Today is 2.4 and downtrending with diuresis.   Anemia In June it was 12.4.  Currently 9.9-8.2-7.4-7.6-7.5, seems to be stabilizing.  Oncology recommends aggressive IV iron repletion.  OSA On CPAP here, not at home.    Seizure disorder On Keppra 500 mg twice daily   Diabetes Last A1c 7.2% For questions or updates, please contact [facility] Please consult [email or web address] for contact info under        Signed, Abagail Kitchens, PA-C

## 2023-02-14 NOTE — Progress Notes (Signed)
  Progress Note   Patient: [name] [name] WGN:[id number] DOB: [birth year] DOA: 02/11/2023     2 DOS: the patient was seen and examined on 02/14/2023 at 10:29AM      Brief hospital course: 89 y.o. F with dCHF, severe AS, pHTN, DM, MPN and Jak2, chronic iron deficiency anemia on iron infusions, CKD IV baseline 2.2, hypothyroidism, seizures, and OSA who presented with CHF flare in the setting of advanced AS and pHTN.     Assessment and Plan: * Acute on chronic respiratory failure with hypoxia (HCC) Acute on chronic diastolic CHF (congestive heart failure) (HCC) Pulmonary hypertension (HCC) Aortic stenosis Net -2.2 L yesterday, creatinine again improving, potassium stable.  Feels that her breathing is somewhat better. - Defer titration of diuretics to cardiology - Hold Farxiga and metoprolol      Anemia of chronic disease and iron deficiency Hemoglobin stable but low.  Discussed transfusion 2 days ago with cardiology, they recommended against.  Her haptoglobin is normal, no evidence of hemolysis.  Likely this is all just due to CKD and chronic iron deficiency.  Got IV iron here. - Continue oral iron - Follow up with Dr. Candise Che after discharge  Type 2 diabetes mellitus with diabetic chronic kidney disease (HCC) Glucose controlled - Continue sliding scale corrections - Hold Januvia  OSA on CPAP - CPAP at night  Seizure disorder (HCC) No seizures here - Continue home Keppra  Essential hypertension Blood pressure controlled to on the soft side - Hold amlodipine, metoprolol  CKD stage 4 due to type 2 diabetes mellitus (HCC) Baseline creatinine 2.2.  Creatinine today improved to 2.4 - Daily BMP while on diuretics  Acquired hypothyroidism - Continue levothyroxine          Subjective: No new symptoms, no concerns.  Overall still out of breath but able to walk, improving overall, just very tired.     Physical Exam: BP (!) 122/46 (BP Location: Left Arm)   Pulse  97   Temp (!) 97.5 F (36.4 C) (Oral)   Resp 20   Ht 5\' 2"  (1.575 m)   Wt 74.5 kg   SpO2 93%   BMI 30.04 kg/m   Elderly adult female, lying in bed, interactive and appropriate RRR, loud holosystolic murmur, no pitting edema, JVP 2 cm above the clavicle Respiratory rate normal, lungs clear without rales or wheezes Abdomen soft, no tenderness palpation Attention normal, affect pleasant, judgment and insight appear normal, upper extremity strength generally weak but symmetric    Data Reviewed: Basic metabolic panel shows creatinine down to 2.4 CBC shows hemoglobin stable at 7.5 Haptoglobin negative  Family Communication: son by phone    Disposition: Status is: Inpatient         Author: Alberteen Sam, MD 02/14/2023 3:47 PM  For on call review [email or web address].

## 2023-02-14 NOTE — Progress Notes (Signed)
Mobility Specialist Progress Note:   02/14/23 1639  Mobility  Activity Ambulated with assistance in hallway  Level of Assistance Contact guard assist, steadying assist  Assistive Device Front wheel walker  Distance Ambulated (ft) 120 ft  Activity Response Tolerated well  Mobility Referral Yes  Mobility visit 1 Mobility  Mobility Specialist Start Time (ACUTE ONLY) 1600  Mobility Specialist Stop Time (ACUTE ONLY) 1615  Mobility Specialist Time Calculation (min) (ACUTE ONLY) 15 min   Pt received in bed, agreeable to mobility. Desat to 84% on 1 L. Pursed lip breathing encouraged. Pt denied any SOB during ambulation. RN increased O2 to 4 L during ambulation for comfort. VSS. Pt returned to room and left in chair on 2 L with all needs met.   Leory Plowman  Mobility Specialist Please contact via [employer] office at [phone]

## 2023-02-14 NOTE — Plan of Care (Signed)
  Problem: Education: Goal: Knowledge of General Education information will improve Description: Including pain rating scale, medication(s)/side effects and non-pharmacologic comfort measures Outcome: Progressing   Problem: Health Behavior/Discharge Planning: Goal: Ability to manage health-related needs will improve Outcome: Progressing   Problem: Clinical Measurements: Goal: Respiratory complications will improve Outcome: Progressing   Problem: Nutrition: Goal: Adequate nutrition will be maintained Outcome: Progressing   Problem: Coping: Goal: Level of anxiety will decrease Outcome: Progressing   Problem: Pain Management: Goal: General experience of comfort will improve Outcome: Progressing   Problem: Safety: Goal: Ability to remain free from injury will improve Outcome: Progressing   Problem: Nutritional: Goal: Progress toward achieving an optimal weight will improve Outcome: Not Progressing

## 2023-02-14 NOTE — Progress Notes (Signed)
Mobility Specialist Progress Note:   02/14/23 1329  Mobility  Activity Ambulated with assistance in hallway  Level of Assistance Contact guard assist, steadying assist  Assistive Device Front wheel walker  Distance Ambulated (ft) 110 ft  Activity Response Tolerated well  Mobility Referral Yes  Mobility visit 1 Mobility  Mobility Specialist Start Time (ACUTE ONLY) 1315  Mobility Specialist Stop Time (ACUTE ONLY) 1327  Mobility Specialist Time Calculation (min) (ACUTE ONLY) 12 min   Pre Mobility: 100 HR , 94% SpO2 2 L During Mobility: 123 HR , 83%-92% SpO2 2 L Post Mobility: 100 HR , 91% SpO2 2 L  Pt received in bed, agreeable to mobility. Desat to 83% on 2 L  during ambulation. Pursed lip breathing encouraged. Pt denied any SOB during session, asx throughout. Pt returned to bed with call bell in reach and all needs met.   Leory Plowman  Mobility Specialist Please contact via [employer] office at [phone]

## 2023-02-15 DIAGNOSIS — I272 Pulmonary hypertension, unspecified: Secondary | ICD-10-CM | POA: Diagnosis not present

## 2023-02-15 DIAGNOSIS — I5033 Acute on chronic diastolic (congestive) heart failure: Secondary | ICD-10-CM | POA: Diagnosis not present

## 2023-02-15 DIAGNOSIS — J9621 Acute and chronic respiratory failure with hypoxia: Secondary | ICD-10-CM | POA: Diagnosis not present

## 2023-02-15 DIAGNOSIS — I350 Nonrheumatic aortic (valve) stenosis: Secondary | ICD-10-CM | POA: Diagnosis not present

## 2023-02-15 LAB — BASIC METABOLIC PANEL
Anion gap: 12 (ref 5–15)
BUN: 48 mg/dL — ABNORMAL HIGH (ref 8–23)
CO2: 19 mmol/L — ABNORMAL LOW (ref 22–32)
Calcium: 9.7 mg/dL (ref 8.9–10.3)
Chloride: 107 mmol/L (ref 98–111)
Creatinine, Ser: 2.29 mg/dL — ABNORMAL HIGH (ref 0.44–1.00)
GFR, Estimated: 20 mL/min — ABNORMAL LOW (ref >60)
Glucose, Bld: 194 mg/dL — ABNORMAL HIGH (ref 70–99)
Potassium: 3.6 mmol/L (ref 3.5–5.1)
Sodium: 138 mmol/L (ref 135–145)

## 2023-02-15 LAB — GLUCOSE, CAPILLARY
Glucose-Capillary: 191 mg/dL — ABNORMAL HIGH (ref 70–99)
Glucose-Capillary: 288 mg/dL — ABNORMAL HIGH (ref 70–99)
Glucose-Capillary: 241 mg/dL — ABNORMAL HIGH (ref 70–99)
Glucose-Capillary: 231 mg/dL — ABNORMAL HIGH (ref 70–99)
Glucose-Capillary: 188 mg/dL — ABNORMAL HIGH (ref 70–99)

## 2023-02-15 LAB — CBC
HCT: 29 % — ABNORMAL LOW (ref 36.0–46.0)
Hemoglobin: 8.4 g/dL — ABNORMAL LOW (ref 12.0–15.0)
MCH: 23.9 pg — ABNORMAL LOW (ref 26.0–34.0)
MCHC: 29 g/dL — ABNORMAL LOW (ref 30.0–36.0)
MCV: 82.6 fL (ref 80.0–100.0)
Platelets: 302 10*3/uL (ref 150–400)
RBC: 3.51 MIL/uL — ABNORMAL LOW (ref 3.87–5.11)
RDW: 22.3 % — ABNORMAL HIGH (ref 11.5–15.5)
WBC: 4 10*3/uL (ref 4.0–10.5)
nRBC: 0.5 % — ABNORMAL HIGH (ref 0.0–0.2)

## 2023-02-15 MED ORDER — LINAGLIPTIN 5 MG PO TABS
5.0000 mg | ORAL_TABLET | Freq: Every day | ORAL | Status: DC
  Administered 2023-02-15 – 2023-02-17 (×3): 5 mg via ORAL
  Filled 2023-02-15 (×3): qty 1

## 2023-02-15 MED ORDER — INSULIN ASPART 100 UNIT/ML IJ SOLN: 0.0000 [IU] | Freq: Every day | INTRAMUSCULAR | Status: DC

## 2023-02-15 MED ORDER — INSULIN ASPART 100 UNIT/ML IJ SOLN
0.0000 [IU] | Freq: Three times a day (TID) | INTRAMUSCULAR | Status: DC
  Administered 2023-02-16 (×3): 3 [IU] via SUBCUTANEOUS
  Administered 2023-02-17: 5 [IU] via SUBCUTANEOUS
  Administered 2023-02-17: 3 [IU] via SUBCUTANEOUS

## 2023-02-15 NOTE — Progress Notes (Signed)
  Mobility Specialist Progress Note   02/15/23 1445  Mobility  Activity Ambulated with assistance in hallway  Level of Assistance Contact guard assist, steadying assist  Assistive Device Front wheel walker  Distance Ambulated (ft) 120 ft  Range of Motion/Exercises Active;All extremities  Activity Response Tolerated well   Pre Ambulation:  HR 89, SpO2 98% 2LO2 During Ambulation: HR 116, SpO2 90-92% 2LO2 Post Ambulation: HR 101, SpO2 96% 2LO2  Patient received in supine, eager and motivated to participate. Completed bed mobility independently and stood with supervision. Ambulated min guard to supervision with slow steady gait. Required x2 standing rest breaks secondary to fatigue and SOB. Oxygen saturation maintained >90% on 2LO2 throughout. Returned to room without complaint or incident. Was left in supine with all needs met, call bell in reach.   [place] Gus Littler, BS EXP Mobility Specialist Please contact via [employer] or Rehab office at [phone]

## 2023-02-15 NOTE — Progress Notes (Signed)
  Progress Note   Patient: [name] [name]:[id number] DOB: 1932-08-10 DOA: 02/11/2023     3 DOS: the patient was seen and examined on 02/15/2023 at 10:17AM      Brief hospital course: 89 y.o. F with dCHF, severe AS, pHTN, DM, MPN and Jak2, chronic iron deficiency anemia on iron infusions, CKD IV baseline 2.2, hypothyroidism, seizures, and OSA who presented with CHF flare in the setting of advanced AS and pHTN.     Assessment and Plan: * Acute on chronic respiratory failure with hypoxia (HCC) Acute on chronic diastolic CHF (congestive heart failure) (HCC) Pulmonary hypertension (HCC) Aortic stenosis Net -1.3 L again, creatinine improved to 2.2, potassium okay. -Continue IV Lasix - Hold Farxiga - Defer metolazone to cardiology - Consult cardiology, appreciate recommendations        Anemia of chronic disease Infused iron - Continue oral iron - Outpatient Heme follow up  Type 2 diabetes mellitus with diabetic chronic kidney disease (HCC) Glucoses elevated -Continue sliding scale corrections, increase dose - Resume Januvia OSA on CPAP - CPAP at night  Seizure disorder (HCC) No seizures here - Continue home Keppra  Essential hypertension Blood pressure controlled to on the soft side - Hold amlodipine, metoprolol  CKD stage 4 due to type 2 diabetes mellitus (HCC) Creatinine improved to baseline 2.2 - Daily BMP while on diuretics  Acquired hypothyroidism - Continue levothyroxine          Subjective: Patient is feeling okay, still overall tired, no chest pain, shortness of breath, dyspnea, confusion, seizures.     Physical Exam: BP (!) 144/48 (BP Location: Left Arm)   Pulse 100   Temp (!) 97.2 F (36.2 C) (Oral)   Resp 18   Ht 5\' 2"  (1.575 m)   Wt 74.5 kg   SpO2 94%   BMI 30.04 kg/m   Adult female, lying in bed, interactive and appropriate Attention normal, affect normal, judgment and insight appear normal, face symmetric, speech  fluent RRR, loud systolic systolic murmur, no pitting edema, laying flat Respiratory rate normal, lungs clear without rales or wheezes Abdomen soft, no tenderness palpation  Data Reviewed: Basic metabolic panel shows creatinine down to 2.2, potassium normal CBC shows hemoglobin up to 8.4  Family Communication: Son by phone    Disposition: Status is: Inpatient         Author: Alberteen Sam, MD 02/15/2023 4:46 PM  For on call review [email or web address].

## 2023-02-15 NOTE — Progress Notes (Signed)
Patient Name: [name] [name] Date of Encounter: 02/15/2023 [facility] Cardiologist: None   Interval Summary  .    Net negative another 1.3L overnight. Overall 4L Negative. Creatinine further improved to 2.29 today.  Vital Signs .    Vitals:   02/14/23 2308 02/14/23 2313 02/15/23 0338 02/15/23 0734  BP:  (!) 109/44 (!) 122/53 (!) 102/39  Pulse: 96 95 95 89  Resp:  20 20 17   Temp:  97.9 F (36.6 C) (!) 97.4 F (36.3 C) 97.8 F (36.6 C)  TempSrc:  Oral Oral Oral  SpO2: (!) 86% 90% 93% 98%  Weight:      Height:        Intake/Output Summary (Last 24 hours) at 02/15/2023 1145 Last data filed at 02/15/2023 1000 Gross per 24 hour  Intake 240 ml  Output 1900 ml  Net -1660 ml      02/12/2023    3:29 AM 02/11/2023    9:39 AM 01/28/2023   10:05 AM  Last 3 Weights  Weight (lbs) 164 lb 3.9 oz 166 lb 0.1 oz 166 lb  Weight (kg) 74.5 kg 75.3 kg 75.297 kg      Telemetry/ECG    Sinus rhythm- Personally Reviewed  CV Studies    Echocardiogram 02/11/2023 1. Left ventricular ejection fraction, by estimation, is 60 to 65%. The  left ventricle has normal function. The left ventricle has no regional  wall motion abnormalities. There is moderate concentric left ventricular  hypertrophy. Left ventricular  diastolic parameters are consistent with Grade II diastolic dysfunction  (pseudonormalization). Elevated left ventricular end-diastolic pressure.  There is the interventricular septum is flattened in systole, consistent  with right ventricular pressure  overload.   2. Right ventricular systolic function is normal. The right ventricular  size is normal. There is severely elevated pulmonary artery systolic  pressure.   3. Right atrial size was mildly dilated.   4. The mitral valve is grossly normal. Trivial mitral valve  regurgitation. No evidence of mitral stenosis.   5. DI 0.31, suggests moderate stenosis. The aortic valve is calcified.  There is moderate  calcification of the aortic valve. There is moderate  thickening of the aortic valve. Aortic valve regurgitation is not  visualized. Moderate aortic valve stenosis.   6. The inferior vena cava is dilated in size with <50% respiratory  variability, suggesting right atrial pressure of 15 mmHg.   Comparison(s): Changes from prior study are noted. Grade 2 diastolic  dysfunction with elevated LVEDP and elevated RVSP, moderate aortic  stenosis.   Physical Exam .   GEN: No acute distress.   Neck: + JVD Cardiac: RRR, no murmurs, rubs, or gallops.  Respiratory: Positive crackles GI: Soft, nontender, non-distended  MS: Mild edema  Patient Profile    [name] [name] is a 89 y.o. female has hx of  aortic stenosis, chronic HFpEF, pulmonary hypertension, JAK2 positive myeloproliferative neoplasm followed by oncology, hypertension, hyperlipidemia, CKD stage IV, hypothyroidism, seizure disorder, diabetes, anemia, OSA,  and admitted on 02/12/2019 for for the evaluation of shortness of breath.  Assessment & Plan .     Acute on chronic respiratory failure with hypoxia  This is multifactorial likely due to her worsening anemia maybe secondary to MPN, metabolic acidosis, severe valvular disease with pulmonary hypertension.  These are all likely to advance and with her advanced age would recommend palliative care consultation.  We discussed in great detail and she would opt for more conservative management.  VQ scan negative Continue  IV diuresis   Chronic HFpEF Pulmonary hypertension likely WHO group 3 Severe AS She has hyperdynamic LVEF 70-75%, likely compensating.  Grade 2 diastolic dysfunction with interventricular septum flattening with mildly reduced RV function and RVSP of 79.9. Severe aortic stenosis with a mean gradient 39.5, prior echocardiogram in November 2023 mean gradient was 36, slowly progressing but stable.  Given that she is very functional could possibly be a TAVR candidate however with  preoperative workup in her renal function with no desire of going on dialysis she has voiced again conservative management and deferment of this.    Acute on chronic HFPEF Continue IV lasix 80 mg daily - creatinine continues to improve    JAK2 positive myeloproliferative neoplasm Per oncology notes no indications for treatment.    CKD Appears to be around baseline, 2.58 on admission.  Today is 2.29 and downtrending with diuresis.   Anemia In June it was 12.4.  Currently 9.9-8.2-7.4-7.6-7.5, seems to be stabilizing.  Oncology recommends aggressive IV iron repletion.  OSA On CPAP here, not at home.    Seizure disorder On Keppra 500 mg twice daily   Diabetes Last A1c 7.2%  For questions or updates, please contact [facility] Please consult [email or web address] for contact info under   Chrystie Nose, MD, Milagros Loll  [place]  [facility]  Medical Director of the Advanced Lipid Disorders &  Cardiovascular Risk Reduction Clinic Diplomate of the American Board of Clinical Lipidology Attending Cardiologist  Direct Dial: [phone]  Fax: [phone]  Website:  [email or web address]  Chrystie Nose, MD

## 2023-02-16 DIAGNOSIS — I5033 Acute on chronic diastolic (congestive) heart failure: Secondary | ICD-10-CM | POA: Diagnosis not present

## 2023-02-16 DIAGNOSIS — I5031 Acute diastolic (congestive) heart failure: Secondary | ICD-10-CM | POA: Diagnosis not present

## 2023-02-16 DIAGNOSIS — N179 Acute kidney failure, unspecified: Secondary | ICD-10-CM | POA: Diagnosis not present

## 2023-02-16 DIAGNOSIS — J9621 Acute and chronic respiratory failure with hypoxia: Secondary | ICD-10-CM | POA: Diagnosis not present

## 2023-02-16 DIAGNOSIS — Z7189 Other specified counseling: Secondary | ICD-10-CM | POA: Diagnosis not present

## 2023-02-16 DIAGNOSIS — I350 Nonrheumatic aortic (valve) stenosis: Secondary | ICD-10-CM | POA: Diagnosis not present

## 2023-02-16 DIAGNOSIS — Z515 Encounter for palliative care: Secondary | ICD-10-CM | POA: Diagnosis not present

## 2023-02-16 LAB — CBC
HCT: 25 % — ABNORMAL LOW (ref 36.0–46.0)
Hemoglobin: 7.4 g/dL — ABNORMAL LOW (ref 12.0–15.0)
MCH: 24.3 pg — ABNORMAL LOW (ref 26.0–34.0)
MCHC: 29.6 g/dL — ABNORMAL LOW (ref 30.0–36.0)
MCV: 82.2 fL (ref 80.0–100.0)
Platelets: 243 10*3/uL (ref 150–400)
RBC: 3.04 MIL/uL — ABNORMAL LOW (ref 3.87–5.11)
RDW: 22.5 % — ABNORMAL HIGH (ref 11.5–15.5)
WBC: 3.4 10*3/uL — ABNORMAL LOW (ref 4.0–10.5)
nRBC: 0.6 % — ABNORMAL HIGH (ref 0.0–0.2)

## 2023-02-16 LAB — COMPREHENSIVE METABOLIC PANEL
ALT: 13 U/L (ref 0–44)
AST: 15 U/L (ref 15–41)
Albumin: 3.1 g/dL — ABNORMAL LOW (ref 3.5–5.0)
Alkaline Phosphatase: 45 U/L (ref 38–126)
Anion gap: 10 (ref 5–15)
BUN: 52 mg/dL — ABNORMAL HIGH (ref 8–23)
CO2: 21 mmol/L — ABNORMAL LOW (ref 22–32)
Calcium: 9.3 mg/dL (ref 8.9–10.3)
Chloride: 107 mmol/L (ref 98–111)
Creatinine, Ser: 2.31 mg/dL — ABNORMAL HIGH (ref 0.44–1.00)
GFR, Estimated: 20 mL/min — ABNORMAL LOW (ref >60)
Glucose, Bld: 189 mg/dL — ABNORMAL HIGH (ref 70–99)
Potassium: 3.7 mmol/L (ref 3.5–5.1)
Sodium: 138 mmol/L (ref 135–145)
Total Bilirubin: 0.6 mg/dL (ref <1.2)
Total Protein: 5.9 g/dL — ABNORMAL LOW (ref 6.5–8.1)

## 2023-02-16 LAB — CULTURE, BLOOD (ROUTINE X 2)
Culture: NO GROWTH
Culture: NO GROWTH
Special Requests: ADEQUATE

## 2023-02-16 LAB — GLUCOSE, CAPILLARY
Glucose-Capillary: 185 mg/dL — ABNORMAL HIGH (ref 70–99)
Glucose-Capillary: 166 mg/dL — ABNORMAL HIGH (ref 70–99)
Glucose-Capillary: 153 mg/dL — ABNORMAL HIGH (ref 70–99)
Glucose-Capillary: 193 mg/dL — ABNORMAL HIGH (ref 70–99)

## 2023-02-16 MED ORDER — FUROSEMIDE 40 MG PO TABS
80.0000 mg | ORAL_TABLET | Freq: Every day | ORAL | Status: DC
  Administered 2023-02-17: 80 mg via ORAL
  Filled 2023-02-16: qty 2

## 2023-02-16 NOTE — Progress Notes (Signed)
°  Progress Note   Patient: [name] [name]:[id number] DOB: 1933/01/27 DOA: 02/11/2023     4 DOS: the patient was seen and examined on 02/16/2023 at 10:02 AM      Brief hospital course: 89 y.o. F with dCHF, severe AS, pHTN, DM, MPN and Jak2, chronic iron deficiency anemia on iron infusions, CKD IV baseline 2.2, hypothyroidism, seizures, and OSA who presented with CHF flare in the setting of advanced AS and pHTN.     Assessment and Plan: * Acute on chronic respiratory failure with hypoxia (HCC) Acute on chronic diastolic CHF (congestive heart failure) (HCC) Pulmonary hypertension (HCC) Net -1.4 L yesterday, creatinine slightly up to 2.3 -Transition to oral diuretics - Hold Farxiga, metoprolol    Anemia of chronic disease -Continue iron - Follow-up with hematology outpatient  Type 2 diabetes mellitus with diabetic chronic kidney disease (HCC) Glucoses improved - Continue linagliptin - Continue sliding scale corrections  OSA on CPAP - CPAP at night  Seizure disorder (HCC) -Continue Keppra  Essential hypertension Blood pressure still somewhat low - Hold amlodipine and metoprolol  CKD stage 4 due to type 2 diabetes mellitus (HCC) Creatinine 2.3  Acquired hypothyroidism - Continue levothyroxine          Subjective: Patient feels okay, just tired.  She is walking with physical therapy, we will transition to oral diuretics today     Physical Exam: BP (!) 139/55 (BP Location: Left Arm)   Pulse (!) 103   Temp 97.9 F (36.6 C) (Oral)   Resp 18   Ht 5\' 2"  (1.575 m)   Wt 72.8 kg   SpO2 92%   BMI 29.37 kg/m   Elderly adult female, lying in bed, appears tired RRR, loud systolic murmur, no peripheral edema Respiratory normal, lungs clear without rales or wheezes Abdomen soft no tenderness palpation or guarding   Data Reviewed: Discussed with cardiology CBC shows hemoglobin 7.4 Basic metabolic panel shows creatinine up to 2.3, potassium  3.7  Family Communication: None present    Disposition: Status is: Inpatient         Author: Alberteen Sam, MD 02/16/2023 4:48 PM  For on call review [email or web address].

## 2023-02-16 NOTE — Progress Notes (Signed)
° °  Palliative Medicine Inpatient Follow Up Note HPI: [name] [name] is a 89 y.o. female with a hx of aortic stenosis, chronic HFpEF, pulmonary hypertension, JAK2 positive myeloproliferative neoplasm followed by oncology, hypertension, hyperlipidemia, CKD stage IV, hypothyroidism, seizure disorder, diabetes, anemia, & OSA. Palliative care has been asked to address goals of care in the setting of end stage pulmonary hypertension.    Today's Discussion 02/16/2023  *Please note that this is a verbal dictation therefore any spelling or grammatical errors are due to the "Dragon Medical One" system interpretation.  Chart reviewed inclusive of vital signs, progress notes, laboratory results, and diagnostic images. Received iron infusion for anemia. Cr stable. Plan for oral lasix tomorrow per cardiology.   I met with [name] [name] this late morning.  She shares that she is feeling better overall.   Created space and opportunity for patient to explore thoughts feelings and fears regarding current medical situation.She is very realistic about her current health. She asks me if I may be able to assist her in the completion of her HCPOA and Living Will documents. We were able to read the documents out loud together, [name] [name] elected who she wanted to be her decision makers if she were unable to make decisions for herself. [name] [name] also completed the living will component in accordance to what she would or would not desire. I shared the plan for the Chaplain to assist with formalization tomorrow.  Questions and concerns addressed/Palliative Support Provided.   Objective Assessment: Vital Signs Vitals:   02/16/23 1000 02/16/23 1221  BP:  (!) 137/52  Pulse: 99 (!) 102  Resp:  20  Temp:  97.9 F (36.6 C)  SpO2:  94%    Intake/Output Summary (Last 24 hours) at 02/16/2023 1226 Last data filed at 02/16/2023 1000 Gross per 24 hour  Intake 720 ml  Output 1900 ml  Net -1180 ml   Last Weight  Most  recent update: 02/16/2023  3:45 AM    Weight  72.8 kg (160 lb 9.6 oz)            Gen: Elderly Caucasian female in no acute distress HEENT: moist mucous membranes CV: Regular rate and rhythm PULM: On 2L nasal cannula breathing appears even and nonlabored ABD: soft/nontender EXT: No edema Neuro: Alert and oriented x3  SUMMARY OF RECOMMENDATIONS   DNAR/DNI   Advanced Directives filled in --> Will request Chaplain support to help with notary process tomorrow   Plan for OP Palliative support on discharge   Palliative care will continue to follow as needed  Billing based on MDM: High ______________________________________________________________________________________ Lamarr Lulas  Palliative Medicine Team Team Cell Phone: [phone] Please utilize secure chat with additional questions, if there is no response within 30 minutes please call the above phone number  Palliative Medicine Team providers are available by phone from 7am to 7pm daily and can be reached through the team cell phone.  Should this patient require assistance outside of these hours, please call the patient's attending physician.

## 2023-02-16 NOTE — Progress Notes (Signed)
°   02/16/23 2334  BiPAP/CPAP/SIPAP  $ Non-Invasive Home Ventilator  Subsequent  BiPAP/CPAP/SIPAP Pt Type Adult  BiPAP/CPAP/SIPAP Resmed  Reason BIPAP/CPAP not in use Non-compliant (pt refused)  BiPAP/CPAP /SiPAP Vitals  Temp 98 F (36.7 C)  Pulse Rate 100  Resp 17  BP (!) 136/51  SpO2 98 %  MEWS Score/Color  MEWS Score 1  MEWS Score Color Green

## 2023-02-16 NOTE — Plan of Care (Signed)
°  Problem: Education: Goal: Knowledge of General Education information will improve Description: Including pain rating scale, medication(s)/side effects and non-pharmacologic comfort measures Outcome: Progressing   Problem: Health Behavior/Discharge Planning: Goal: Ability to manage health-related needs will improve Outcome: Progressing   Problem: Clinical Measurements: Goal: Ability to maintain clinical measurements within normal limits will improve Outcome: Progressing Goal: Will remain free from infection Outcome: Progressing Goal: Diagnostic test results will improve Outcome: Progressing Goal: Respiratory complications will improve Outcome: Progressing Goal: Cardiovascular complication will be avoided Outcome: Progressing   Problem: Skin Integrity: Goal: Risk for impaired skin integrity will decrease Outcome: Progressing   Problem: Nutritional: Goal: Maintenance of adequate nutrition will improve Outcome: Progressing Goal: Progress toward achieving an optimal weight will improve Outcome: Progressing   Problem: Metabolic: Goal: Ability to maintain appropriate glucose levels will improve Outcome: Progressing   Problem: Coping: Goal: Ability to adjust to condition or change in health will improve Outcome: Progressing

## 2023-02-16 NOTE — Progress Notes (Signed)
Patient Name: [name] Date of Encounter: 02/16/2023 [facility] Cardiologist: None   Interval Summary  .    Net negative another 1.4L overnight. Overall 5.4L Negative. Creatinine stable at 2.31. Hemoglobin now 7.4 (from 8.4)  Vital Signs .    Vitals:   02/16/23 0400 02/16/23 0600 02/16/23 0806 02/16/23 0947  BP: (!) 132/51 (!) 131/42 (!) 124/52 (!) 133/46  Pulse: 84 95 100   Resp: 14     Temp: 98 F (36.7 C) 97.9 F (36.6 C) (!) 97.4 F (36.3 C)   TempSrc:   Oral   SpO2: 96% 94% 93%   Weight:      Height:        Intake/Output Summary (Last 24 hours) at 02/16/2023 0957 Last data filed at 02/16/2023 0341 Gross per 24 hour  Intake 480 ml  Output 2150 ml  Net -1670 ml      02/16/2023    3:44 AM 02/12/2023    3:29 AM 02/11/2023    9:39 AM  Last 3 Weights  Weight (lbs) 160 lb 9.6 oz 164 lb 3.9 oz 166 lb 0.1 oz  Weight (kg) 72.848 kg 74.5 kg 75.3 kg      Telemetry/ECG    Sinus rhythm- Personally Reviewed  CV Studies    Echocardiogram 02/11/2023 1. Left ventricular ejection fraction, by estimation, is 60 to 65%. The  left ventricle has normal function. The left ventricle has no regional  wall motion abnormalities. There is moderate concentric left ventricular  hypertrophy. Left ventricular  diastolic parameters are consistent with Grade II diastolic dysfunction  (pseudonormalization). Elevated left ventricular end-diastolic pressure.  There is the interventricular septum is flattened in systole, consistent  with right ventricular pressure  overload.   2. Right ventricular systolic function is normal. The right ventricular  size is normal. There is severely elevated pulmonary artery systolic  pressure.   3. Right atrial size was mildly dilated.   4. The mitral valve is grossly normal. Trivial mitral valve  regurgitation. No evidence of mitral stenosis.   5. DI 0.31, suggests moderate stenosis. The aortic valve is calcified.  There is  moderate calcification of the aortic valve. There is moderate  thickening of the aortic valve. Aortic valve regurgitation is not  visualized. Moderate aortic valve stenosis.   6. The inferior vena cava is dilated in size with <50% respiratory  variability, suggesting right atrial pressure of 15 mmHg.   Comparison(s): Changes from prior study are noted. Grade 2 diastolic  dysfunction with elevated LVEDP and elevated RVSP, moderate aortic  stenosis.   Physical Exam .    GEN: No acute distress.   Neck: no JVD Cardiac: RRR, no murmurs, rubs, or gallops.  Respiratory: Positive crackles GI: Soft, nontender, non-distended  MS: Mild edema  Patient Profile    DEANN RUSSO is a 89 y.o. female has hx of  aortic stenosis, chronic HFpEF, pulmonary hypertension, JAK2 positive myeloproliferative neoplasm followed by oncology, hypertension, hyperlipidemia, CKD stage IV, hypothyroidism, seizure disorder, diabetes, anemia, OSA,  and admitted on 02/12/2019 for for the evaluation of shortness of breath.  Assessment & Plan .     Acute on chronic respiratory failure with hypoxia  This is multifactorial likely due to her worsening anemia maybe secondary to MPN, metabolic acidosis, severe valvular disease with pulmonary hypertension.  These are all likely to advance and with her advanced age would recommend palliative care consultation.  We discussed in great detail and she would opt for more  conservative management.  VQ scan negative Net negative, but creatinine stabilized today - plan to switch to oral lasix tomorrow.   Chronic HFpEF Pulmonary hypertension likely WHO group 3 Severe AS She has hyperdynamic LVEF 70-75%, likely compensating.  Grade 2 diastolic dysfunction with interventricular septum flattening with mildly reduced RV function and RVSP of 79.9. Severe aortic stenosis with a mean gradient 39.5, prior echocardiogram in November 2023 mean gradient was 36, slowly progressing but stable.   Given that she is very functional could possibly be a TAVR candidate however with preoperative workup in her renal function with no desire of going on dialysis she has voiced again conservative management and deferment of this.    Acute on chronic HFPEF Creatinine stable- given 80 mg IV lasix today, will switch to 80 mg po lasix daily tomorrow.    JAK2 positive myeloproliferative neoplasm Per oncology notes no indications for treatment.    CKD Appears to be around baseline, 2.58 on admission.  Today is 2.31 which is stable compared to yesterday.   Anemia In June it was 12.4.  Currently 9.9-8.2-7.4-7.6-7.4, seems to be stabilizing.  Oncology recommends aggressive IV iron repletion.  OSA On CPAP here, not at home.    Seizure disorder On Keppra 500 mg twice daily   Diabetes Last A1c 7.2%  For questions or updates, please contact [facility] Please consult [email or web address] for contact info under   Chrystie Nose, MD, Milagros Loll  [place]  [facility]  Medical Director of the Advanced Lipid Disorders &  Cardiovascular Risk Reduction Clinic Diplomate of the American Board of Clinical Lipidology Attending Cardiologist  Direct Dial: [phone]  Fax: [phone]  Website:  [email or web address]  Chrystie Nose, MD

## 2023-02-17 ENCOUNTER — Telehealth: Payer: Self-pay | Admitting: Hematology

## 2023-02-17 ENCOUNTER — Encounter (HOSPITAL_COMMUNITY): Payer: Self-pay

## 2023-02-17 ENCOUNTER — Encounter: Payer: Self-pay | Admitting: Hematology

## 2023-02-17 ENCOUNTER — Other Ambulatory Visit (HOSPITAL_COMMUNITY): Payer: Self-pay

## 2023-02-17 DIAGNOSIS — J9621 Acute and chronic respiratory failure with hypoxia: Secondary | ICD-10-CM | POA: Diagnosis not present

## 2023-02-17 LAB — COMPREHENSIVE METABOLIC PANEL
ALT: 17 U/L (ref 0–44)
AST: 22 U/L (ref 15–41)
Albumin: 3.2 g/dL — ABNORMAL LOW (ref 3.5–5.0)
Alkaline Phosphatase: 44 U/L (ref 38–126)
Anion gap: 10 (ref 5–15)
BUN: 55 mg/dL — ABNORMAL HIGH (ref 8–23)
CO2: 20 mmol/L — ABNORMAL LOW (ref 22–32)
Calcium: 9.6 mg/dL (ref 8.9–10.3)
Chloride: 106 mmol/L (ref 98–111)
Creatinine, Ser: 2.02 mg/dL — ABNORMAL HIGH (ref 0.44–1.00)
GFR, Estimated: 23 mL/min — ABNORMAL LOW (ref >60)
Glucose, Bld: 171 mg/dL — ABNORMAL HIGH (ref 70–99)
Potassium: 3.5 mmol/L (ref 3.5–5.1)
Sodium: 136 mmol/L (ref 135–145)
Total Bilirubin: 0.5 mg/dL (ref <1.2)
Total Protein: 6 g/dL — ABNORMAL LOW (ref 6.5–8.1)

## 2023-02-17 LAB — CBC
HCT: 26.2 % — ABNORMAL LOW (ref 36.0–46.0)
Hemoglobin: 7.6 g/dL — ABNORMAL LOW (ref 12.0–15.0)
MCH: 24.2 pg — ABNORMAL LOW (ref 26.0–34.0)
MCHC: 29 g/dL — ABNORMAL LOW (ref 30.0–36.0)
MCV: 83.4 fL (ref 80.0–100.0)
Platelets: 212 10*3/uL (ref 150–400)
RBC: 3.14 MIL/uL — ABNORMAL LOW (ref 3.87–5.11)
RDW: 22.7 % — ABNORMAL HIGH (ref 11.5–15.5)
WBC: 3.2 10*3/uL — ABNORMAL LOW (ref 4.0–10.5)
nRBC: 0.6 % — ABNORMAL HIGH (ref 0.0–0.2)

## 2023-02-17 LAB — GLUCOSE, CAPILLARY
Glucose-Capillary: 171 mg/dL — ABNORMAL HIGH (ref 70–99)
Glucose-Capillary: 209 mg/dL — ABNORMAL HIGH (ref 70–99)

## 2023-02-17 MED ORDER — FERROUS SULFATE 325 (65 FE) MG PO TABS: 325.0000 mg | ORAL_TABLET | ORAL | Status: DC

## 2023-02-17 MED ORDER — FUROSEMIDE 80 MG PO TABS
80.0000 mg | ORAL_TABLET | Freq: Every day | ORAL | 0 refills | Status: DC
  Filled 2023-02-17: qty 30, 30d supply, fill #0

## 2023-02-17 NOTE — Telephone Encounter (Signed)
Patient has stated they have been in the hospital and was not able to answer the phone at the previous times; patient has stated they will callback when ready to schedule future iron infusions and also to reschedule follow up appointment with Dr. Candise Che in April of 2025

## 2023-02-17 NOTE — Discharge Summary (Addendum)
Physician Discharge Summary   Patient: [name] [name] MRN: [id number] DOB: [birth year]  Admit date:     02/11/2023  Discharge date: 02/17/23  Discharge Physician: Alberteen Sam   PCP: Eloisa Northern, MD     Recommendations at discharge:  Follow up with Cardiology on Jan 17 for aortic stenosis and pulmonary hypertension Follow up with PCP for chronic kidney disease Please obtain BMP in 1 week on new furosemide     Discharge Diagnoses: Principal Problem:   Acute on chronic respiratory failure with hypoxia (HCC) Active Problems:   Severe aortic stenosis   Acute on chronic diastolic CHF (congestive heart failure) (HCC)   Severe pulmonary hypertension (HCC)   Acquired hypothyroidism   CKD stage 4 due to type 2 diabetes mellitus (HCC)   Essential hypertension   Seizure disorder (HCC)   OSA on CPAP   Morbid obesity (HCC) BMI 35+ with T2DM, htn, hyperlipidemia, CKD, OSA   Type 2 diabetes mellitus with diabetic chronic kidney disease (HCC)   Anemia of chronic disease   MPN (myeloproliferative neoplasm) [facility])      Hospital Course: 89 y.o. F with dCHF, severe AS, pHTN, DM, MPN and Jak2, chronic iron deficiency anemia on iron infusions, CKD IV baseline 2.2, hypothyroidism, seizures, and OSA who presented with CHF flare in the setting of advanced AS and pHTN.       * Acute on chronic respiratory failure with hypoxia (HCC) Acute on chronic diastolic CHF (congestive heart failure) (HCC) Pulmonary hypertension (HCC) Aortic stenosis Admitted and started on IV Lasix.  VQ scan negative.  Echo showed preserved EF but severe aortic stenosis and severe pulmonary hypertension and grade 2 diastolic dysfunction.  Cardiology were consulted, they stopped her amlodipine, and metoprolol due to soft blood pressures, and she was diuresed 7.5 L.  She was weaned down to 2 L of oxygen, and is stable.  Marcelline Deist held as well due to soft BP.  There were discussions of transaortic valve repair,  but this is deferred for now given concerns about progression of kidney failure during preoperative evaluation.  At the time of discharge, she was transition to oral diuretics, her creatinine improved with diuresis, and she was having fewer symptoms of dyspnea.  Outpatient follow-up was arranged.        Anemia of chronic disease The case was discussed with hematology, her baseline hemoglobin is around 9, and the etiology of her anemia is iron deficiency and anemia of CKD.  She does have an MPN from her JAK2 mutation but no significant polycythemia or thrombocytosis and no signs of JAK2 mutation driven myelofibrosis.  At present given her pulmonary hypertension and aortic stenosis the risk of endoscopy to evaluate blood loss probably outweigh the potential benefits given the slow rate.  She was given IV iron in the hospital, discharged on oral iron She has follow up pending with Dr. Candise Che of Hematology  Type 2 diabetes mellitus with diabetic chronic kidney disease (HCC) On Januvia and insulin  OSA on CPAP On CPAP at night  Seizure disorder (HCC) No seizures here.  On Keppra   Essential hypertension BP soft, amlodipine and metoprolol held at discharge   CKD stage 4 due to type 2 diabetes mellitus (HCC) Cr stable in the 2.2-2.4 range  Acquired hypothyroidism On levothyroxine            [facility] Controlled Substances Registry was reviewed for this patient prior to discharge.  Consultants: Cardiology  Procedures performed:  Echocardiogram VQ scan  Disposition: Assisted living Diet recommendation:  Discharge Diet Orders (From admission, onward)     Start     Ordered   02/17/23 0000  Diet - low sodium heart healthy        02/17/23 1610           Equipment recommendations: Home oxygen  Patient Saturations on Room Air at Rest = spO2 96%   Patient Saturations on Room Air while Ambulating = sp02 84% .  Rested and performed pursed lip breathing for 1  minute with sp02 at 86%.   Patient Saturations on 1 Liters of oxygen while Ambulating = sp02 91%   At end of testing pt left in room on 1  Liters of oxygen.  DISCHARGE MEDICATION: Allergies as of 02/17/2023       Reactions   Ppd [tuberculin Purified Protein Derivative] Other (See Comments)   indurated   Penicillins Other (See Comments)   Unknown         Medication List     STOP taking these medications    amLODipine 5 MG tablet Commonly known as: NORVASC   dapagliflozin propanediol 10 MG Tabs tablet Commonly known as: Farxiga   metoprolol succinate 25 MG 24 hr tablet Commonly known as: TOPROL-XL       TAKE these medications    acetaminophen 500 MG tablet Commonly known as: TYLENOL Take 1,000 mg by mouth every 6 (six) hours as needed for moderate pain.   allopurinol 100 MG tablet Commonly known as: ZYLOPRIM Take 1 tablet (100 mg total) by mouth 2 (two) times daily. Take  1 tablet  Daily  to prevent Gout. What changed: additional instructions   ascorbic acid 500 MG tablet Commonly known as: VITAMIN C Take 500 mg by mouth in the morning.   aspirin 81 MG tablet Take 81 mg by mouth in the morning.   cholecalciferol 25 MCG (1000 UNIT) tablet Commonly known as: VITAMIN D3 Take 3,000 Units by mouth every evening.   ferrous sulfate 325 (65 FE) MG tablet Take 1 tablet (325 mg total) by mouth every other day. Start taking on: February 19, 2023   fish oil-omega-3 fatty acids 1000 MG capsule Take 1,000 mg by mouth at bedtime.   furosemide 80 MG tablet Commonly known as: LASIX Take 1 tablet (80 mg total) by mouth daily. Start taking on: February 18, 2023 What changed:  medication strength how much to take when to take this   lansoprazole 30 MG capsule Commonly known as: PREVACID TAKE 1 CAPSULE BY MOUTH DAILY TO PREVENT HEARTBURN AND INDIGESTION. What changed:  how much to take how to take this when to take this additional instructions   levETIRAcetam  500 MG tablet Commonly known as: KEPPRA Take  1 tablet 2  x /day  with Meals  to Prevent Seizures What changed:  how much to take how to take this when to take this additional instructions   levothyroxine 112 MCG tablet Commonly known as: SYNTHROID Take 112 mcg by mouth in the morning.   loratadine 10 MG tablet Commonly known as: CLARITIN Take 10 mg by mouth in the morning.   meclizine 25 MG tablet Commonly known as: ANTIVERT Take   1 tablet 2 to 3 x /day  ONLY  if needed for Dizziness /Vertigo What changed:  how much to take how to take this when to take this reasons to take this additional instructions   melatonin 3 MG Tabs tablet Take 3 mg by mouth at bedtime.  ondansetron 4 MG tablet Commonly known as: ZOFRAN Take 4 mg by mouth every 6 (six) hours as needed for nausea or vomiting.   rOPINIRole 1 MG tablet Commonly known as: REQUIP TAKE ONE-HALF TO ONE TABLET BY MOUTH THREE TIMES DAILY AS NEEDED FOR RESTLESS LEGS& CRAMPS What changed:  how much to take how to take this when to take this additional instructions   sitaGLIPtin 25 MG tablet Commonly known as: JANUVIA Take 50 mg by mouth in the morning.   vitamin B-12 100 MCG tablet Commonly known as: CYANOCOBALAMIN Take 100 mcg by mouth in the morning.               Durable Medical Equipment  (From admission, onward)           Start     Ordered   02/17/23 1133  DME Oxygen  Once       Question Answer Comment  Length of Need Lifetime   Mode or (Route) Nasal cannula   Liters per Minute 2   Frequency Continuous (stationary and portable oxygen unit needed)   Oxygen delivery system Gas      02/17/23 1133            Follow-up Information     Eloisa Northern, MD. Schedule an appointment as soon as possible for a visit in 1 week(s).   Specialty: Internal Medicine Contact information: [address] [place] [address] [phone]         Chrystie Nose, MD Follow up.   Specialty:  Cardiology Contact information: [address] [place] [address] [phone]         [facility] Follow up.   Why: ([facility])- home 02 arranged- they will provide transport 02 for return to [facility]- then arrange for concentrator at [facility] Contact information: [address] [place] [address] [phone]         [facility]- inhouse therapy- [facility] Follow up.   Why: HHPT/OT arranged with the in-house provider - [facility] to follow up to schedule        [facility] Follow up.   Why: outpt palliative referral made- they will follow up post discharge Contact information: [address] [facility] [place] [address] [phone]                Discharge Instructions     Diet - low sodium heart healthy   Complete by: As directed    Discharge instructions   Complete by: As directed    **IMPORTANT DISCHARGE INSTRUCTIONS**   From Dr. Maryfrances Bunnell: You were admitted for shortness of breath and low oxygen levels.  Here, we found that this was from a flare of heart failure due to severe pulmonary hypertension and severe aortic stenosis  You were treated with Lasix and we diuresed off 7.5 liters of fluid.  You should take the new higher dose of furosemide/Lasix 80 mg daily  For now, STOP amlodipine  HOLD (do not take) Farxiga and metoprolol, although the heart specialists will arrange follow up for you and may restart these medicines or others.  Weigh yourself daily and report weight gain of 5lbs to your doctor or by calling the Cardiology office   Increase activity slowly   Complete by: As directed        Discharge Exam: Filed Weights   02/12/23 0329 02/16/23 0344 02/17/23 0619  Weight: 74.5 kg 72.8 kg 71.6 kg    General: Pt is alert, awake, not  in acute distress Cardiovascular: RRR, nl S1-S2, loud systolic murmur appreciated.   No LE edema.   Respiratory: Normal respiratory rate and rhythm.  CTAB  without rales or wheezes. Abdominal: Abdomen soft and non-tender.  No distension or HSM.   Neuro/Psych: Strength symmetric in upper and lower extremities.  Judgment and insight appear normal.   Condition at discharge: fair  The results of significant diagnostics from this hospitalization (including imaging, microbiology, ancillary and laboratory) are listed below for reference.   Imaging Studies: ECHOCARDIOGRAM COMPLETE Result Date: 02/11/2023    ECHOCARDIOGRAM REPORT   Patient Name:   TYSHIA BEITER Date of Exam: 02/11/2023 Medical Rec #:  [id number]        Height:       62.0 in Accession #:    [id number]       Weight:       166.0 lb Date of Birth:  [birth year]       BSA:          1.766 m Patient Age:    89 years         BP:           106/48 mmHg Patient Gender: F                HR:           85 bpm. Exam Location:  Inpatient Procedure: Cardiac Doppler, Color Doppler and 2D Echo Indications:    dyspnea  History:        Patient has prior history of Echocardiogram examinations, most                 recent 01/04/2022. Chronic kidney disease, Aortic Valve Disease;                 Risk Factors:Hypertension, Dyslipidemia, Diabetes and Sleep                 Apnea.  Sonographer:    Delcie Roch RDCS Referring Phys: [id number] Cecille Po MELVIN IMPRESSIONS  1. Left ventricular ejection fraction, by estimation, is 70 to 75%. The left ventricle has hyperdynamic function. The left ventricle has no regional wall motion abnormalities. Left ventricular diastolic parameters are consistent with Grade II diastolic dysfunction (pseudonormalization). There is the interventricular septum is flattened in systole, consistent with right ventricular pressure overload.  2. Right ventricular systolic function is mildly reduced. The right ventricular size is moderately enlarged. Mildly increased right ventricular wall thickness. There is severely elevated pulmonary artery systolic pressure. The estimated right ventricular  systolic pressure is 79.9 mmHg.  3. Left atrial size was mildly dilated.  4. Right atrial size was moderately dilated.  5. The mitral valve is normal in structure. Trivial mitral valve regurgitation. Mild mitral stenosis. Moderate mitral annular calcification.  6. Tricuspid valve regurgitation is moderate.  7. The aortic valve is tricuspid. There is severe calcifcation of the aortic valve. There is severe thickening of the aortic valve. Aortic valve regurgitation is trivial. Severe aortic valve stenosis. Aortic valve mean gradient measures 39.5 mmHg. Aortic valve Vmax measures 4.07 m/s. Aortic valve acceleration time measures 95 msec.  8. The inferior vena cava is dilated in size with >50% respiratory variability, suggesting right atrial pressure of 8 mmHg. Comparison(s): Prior images reviewed side by side. Aortic stenosis gadients are higher, but this is largely due to increased cardiac output. FINDINGS  Left Ventricle: Left ventricular ejection fraction, by estimation, is 70 to 75%. The left ventricle has hyperdynamic function. The left ventricle  has no regional wall motion abnormalities. The left ventricular internal cavity size was normal in size. There is borderline concentric left ventricular hypertrophy. The interventricular septum is flattened in systole, consistent with right ventricular pressure overload. Left ventricular diastolic parameters are consistent with Grade II diastolic dysfunction (pseudonormalization). Right Ventricle: The right ventricular size is moderately enlarged. Mildly increased right ventricular wall thickness. Right ventricular systolic function is mildly reduced. There is severely elevated pulmonary artery systolic pressure. The tricuspid regurgitant velocity is 4.24 m/s, and with an assumed right atrial pressure of 8 mmHg, the estimated right ventricular systolic pressure is 79.9 mmHg. Left Atrium: Left atrial size was mildly dilated. Right Atrium: Right atrial size was moderately  dilated. Pericardium: There is no evidence of pericardial effusion. Mitral Valve: The mitral valve is normal in structure. There is mild thickening of the mitral valve leaflet(s). Moderate mitral annular calcification. Trivial mitral valve regurgitation. Mild mitral valve stenosis. MV peak gradient, 14.3 mmHg. The mean mitral valve gradient is 5.8 mmHg with average heart rate of 85 bpm. Tricuspid Valve: The tricuspid valve is normal in structure. Tricuspid valve regurgitation is moderate. Aortic Valve: The aortic valve is tricuspid. There is severe calcifcation of the aortic valve. There is severe thickening of the aortic valve. Aortic valve regurgitation is trivial. Severe aortic stenosis is present. Aortic valve mean gradient measures 39.5 mmHg. Aortic valve peak gradient measures 66.3 mmHg. Aortic valve area, by VTI measures 0.99 cm. Pulmonic Valve: The pulmonic valve was normal in structure. Pulmonic valve regurgitation is trivial. No evidence of pulmonic stenosis. Aorta: The aortic root and ascending aorta are structurally normal, with no evidence of dilitation. Venous: The inferior vena cava is dilated in size with greater than 50% respiratory variability, suggesting right atrial pressure of 8 mmHg. IAS/Shunts: No atrial level shunt detected by color flow Doppler.  LEFT VENTRICLE PLAX 2D LVIDd:         4.00 cm   Diastology LVIDs:         2.10 cm   LV e' medial:    4.79 cm/s LV PW:         1.10 cm   LV E/e' medial:  34.2 LV IVS:        1.00 cm   LV e' lateral:   6.20 cm/s LVOT diam:     1.90 cm   LV E/e' lateral: 26.5 LV SV:         86 LV SV Index:   49 LVOT Area:     2.84 cm  RIGHT VENTRICLE             IVC RV Basal diam:  2.70 cm     IVC diam: 2.10 cm RV S prime:     11.00 cm/s TAPSE (M-mode): 1.6 cm LEFT ATRIUM           Index        RIGHT ATRIUM           Index LA diam:      3.90 cm 2.21 cm/m   RA Area:     16.60 cm LA Vol (A4C): 40.5 ml 22.93 ml/m  RA Volume:   43.80 ml  24.80 ml/m  AORTIC VALVE AV  Area (Vmax):    0.84 cm AV Area (Vmean):   0.84 cm AV Area (VTI):     0.99 cm AV Vmax:           407.00 cm/s AV Vmean:          294.000  cm/s AV VTI:            0.874 m AV Peak Grad:      66.3 mmHg AV Mean Grad:      39.5 mmHg LVOT Vmax:         120.84 cm/s LVOT Vmean:        87.399 cm/s LVOT VTI:          0.305 m LVOT/AV VTI ratio: 0.35  AORTA Ao Root diam: 2.70 cm Ao Asc diam:  2.60 cm MITRAL VALVE                TRICUSPID VALVE MV Area (PHT): 3.85 cm     TR Peak grad:   71.9 mmHg MV Peak grad:  14.3 mmHg    TR Vmax:        424.00 cm/s MV Mean grad:  5.8 mmHg MV Vmax:       1.89 m/s     SHUNTS MV Vmean:      134.0 cm/s   Systemic VTI:  0.30 m MV Decel Time: 197 msec     Systemic Diam: 1.90 cm MV E velocity: 164.00 cm/s MV A velocity: 162.00 cm/s MV E/A ratio:  1.01 Mihai Croitoru MD Electronically signed by Thurmon Fair MD Signature Date/Time: 02/11/2023/6:02:30 PM    Final    NM Pulmonary Perfusion Result Date: 02/11/2023 CLINICAL DATA:  Pulmonary embolism suspected, high probability. Shortness of breath. EXAM: NUCLEAR MEDICINE PERFUSION LUNG SCAN TECHNIQUE: Perfusion images were obtained in multiple projections after intravenous injection of radiopharmaceutical. Ventilation scans intentionally deferred if perfusion scan and chest x-ray adequate for interpretation during COVID 19 epidemic. RADIOPHARMACEUTICALS:  3.6 millicuries mCi Tc-16m MAA IV COMPARISON:  Radiography same day FINDINGS: Normal perfusion study.  No defect to suggest pulmonary embolism. IMPRESSION: Normal perfusion study.  No defect to suggest pulmonary embolism. Electronically Signed   By: Paulina Fusi M.D.   On: 02/11/2023 13:10   DG Chest Port 1 View Result Date: 02/11/2023 CLINICAL DATA:  Concern for sepsis. EXAM: PORTABLE CHEST 1 VIEW COMPARISON:  Chest radiograph dated February 09, 2022. FINDINGS: Stable cardiomegaly. Aortic atherosclerosis. Mild central vascular prominence. Similar vascular asymmetry of the right hilum. There  are small left-greater-than-right bilateral pleural effusions with associated atelectasis. No pneumothorax. No acute osseous abnormality. IMPRESSION: 1. Cardiomegaly with mild central vascular prominence. 2. Small left-greater-than-right bilateral pleural effusions and atelectasis. Electronically Signed   By: Hart Robinsons M.D.   On: 02/11/2023 11:14    Microbiology: Results for orders placed or performed during the hospital encounter of 02/11/23  Resp panel by RT-PCR (RSV, Flu A&B, Covid) Anterior Nasal Swab     Status: None   Collection Time: 02/11/23  9:46 AM   Specimen: Anterior Nasal Swab  Result Value Ref Range Status   SARS Coronavirus 2 by RT PCR NEGATIVE NEGATIVE Final   Influenza A by PCR NEGATIVE NEGATIVE Final   Influenza B by PCR NEGATIVE NEGATIVE Final    Comment: (NOTE) The Xpert Xpress SARS-CoV-2/FLU/RSV plus assay is intended as an aid in the diagnosis of influenza from Nasopharyngeal swab specimens and should not be used as a sole basis for treatment. Nasal washings and aspirates are unacceptable for Xpert Xpress SARS-CoV-2/FLU/RSV testing.  Fact Sheet for Patients: BloggerCourse.com  Fact Sheet for Healthcare Providers: SeriousBroker.it  This test is not yet approved or cleared by the [place] FDA and has been authorized for detection and/or diagnosis of SARS-CoV-2 by FDA under an Emergency Use Authorization (EUA). This EUA  will remain in effect (meaning this test can be used) for the duration of the COVID-19 declaration under Section 564(b)(1) of the Act, 21 U.S.C. section 360bbb-3(b)(1), unless the authorization is terminated or revoked.     Resp Syncytial Virus by PCR NEGATIVE NEGATIVE Final    Comment: (NOTE) Fact Sheet for Patients: BloggerCourse.com  Fact Sheet for Healthcare Providers: SeriousBroker.it  This test is not yet approved or cleared  by the [place] FDA and has been authorized for detection and/or diagnosis of SARS-CoV-2 by FDA under an Emergency Use Authorization (EUA). This EUA will remain in effect (meaning this test can be used) for the duration of the COVID-19 declaration under Section 564(b)(1) of the Act, 21 U.S.C. section 360bbb-3(b)(1), unless the authorization is terminated or revoked.  Performed at [facility], [address], [place], [place] [address]   Blood Culture (routine x 2)     Status: None   Collection Time: 02/11/23  9:46 AM   Specimen: BLOOD RIGHT FOREARM  Result Value Ref Range Status   Specimen Description BLOOD RIGHT FOREARM  Final   Special Requests   Final    BOTTLES DRAWN AEROBIC AND ANAEROBIC Blood Culture adequate volume   Culture   Final    NO GROWTH 5 DAYS Performed at [facility], [address], [place], [place] [address]    Report Status 02/16/2023 FINAL  Final  Blood Culture (routine x 2)     Status: None   Collection Time: 02/11/23  9:51 AM   Specimen: BLOOD LEFT FOREARM  Result Value Ref Range Status   Specimen Description BLOOD LEFT FOREARM  Final   Special Requests   Final    BOTTLES DRAWN AEROBIC AND ANAEROBIC Blood Culture results may not be optimal due to an inadequate volume of blood received in culture bottles   Culture   Final    NO GROWTH 5 DAYS Performed at [facility], [address], [place], [place] [address]    Report Status 02/16/2023 FINAL  Final  Respiratory (~20 pathogens) panel by PCR     Status: None   Collection Time: 02/11/23  2:19 PM   Specimen: Nasopharyngeal Swab; Respiratory  Result Value Ref Range Status   Adenovirus NOT DETECTED NOT DETECTED Final   Coronavirus 229E NOT DETECTED NOT DETECTED Final    Comment: (NOTE) The Coronavirus on the Respiratory Panel, DOES NOT test for the novel  Coronavirus (2019 nCoV)    Coronavirus HKU1 NOT DETECTED NOT DETECTED Final   Coronavirus NL63 NOT DETECTED NOT DETECTED  Final   Coronavirus OC43 NOT DETECTED NOT DETECTED Final   Metapneumovirus NOT DETECTED NOT DETECTED Final   Rhinovirus / Enterovirus NOT DETECTED NOT DETECTED Final   Influenza A NOT DETECTED NOT DETECTED Final   Influenza B NOT DETECTED NOT DETECTED Final   Parainfluenza Virus 1 NOT DETECTED NOT DETECTED Final   Parainfluenza Virus 2 NOT DETECTED NOT DETECTED Final   Parainfluenza Virus 3 NOT DETECTED NOT DETECTED Final   Parainfluenza Virus 4 NOT DETECTED NOT DETECTED Final   Respiratory Syncytial Virus NOT DETECTED NOT DETECTED Final   Bordetella pertussis NOT DETECTED NOT DETECTED Final   Bordetella Parapertussis NOT DETECTED NOT DETECTED Final   Chlamydophila pneumoniae NOT DETECTED NOT DETECTED Final   Mycoplasma pneumoniae NOT DETECTED NOT DETECTED Final    Comment: Performed at [facility], [address], [place], [place] [address]    Labs: CBC: Recent Labs  Lab 02/11/23  1013 02/12/23 0301 02/13/23 0300 02/14/23 0348 02/15/23 0308 02/16/23 0241 02/17/23 0309  WBC 6.5   < > 5.0 3.9* 4.0 3.4* 3.2*  NEUTROABS 5.5  --   --   --   --   --   --   HGB 8.2*   < > 7.6* 7.5* 8.4* 7.4* 7.6*  HCT 28.5*   < > 26.3* 26.1* 29.0* 25.0* 26.2*  MCV 84.6   < > 83.0 83.1 82.6 82.2 83.4  PLT 313   < > 262 244 302 243 212   < > = values in this interval not displayed.   Basic Metabolic Panel: Recent Labs  Lab 02/13/23 0300 02/14/23 0348 02/15/23 0308 02/16/23 0241 02/17/23 0309  NA 137 137 138 138 136  K 3.6 3.6 3.6 3.7 3.5  CL 108 108 107 107 106  CO2 18* 20* 19* 21* 20*  GLUCOSE 195* 184* 194* 189* 171*  BUN 54* 49* 48* 52* 55*  CREATININE 2.48* 2.40* 2.29* 2.31* 2.02*  CALCIUM 8.9 9.2 9.7 9.3 9.6   Liver Function Tests: Recent Labs  Lab 02/11/23 1013 02/12/23 0301 02/16/23 0241 02/17/23 0309  AST 14* 11* 15 22  ALT 14 12 13 17   ALKPHOS 54 47 45 44  BILITOT 0.7 0.7 0.6 0.5  PROT 6.2* 5.5* 5.9* 6.0*  ALBUMIN 3.3* 2.8* 3.1* 3.2*   CBG: Recent Labs  Lab  02/16/23 1228 02/16/23 1713 02/16/23 2053 02/17/23 0617 02/17/23 1117  GLUCAP 166* 153* 193* 171* 209*    Discharge time spent: approximately 45 minutes spent on discharge counseling, evaluation of patient on day of discharge, and coordination of discharge planning with nursing, social work, pharmacy and case management  Signed: Alberteen Sam, MD [facility] 02/17/2023

## 2023-02-17 NOTE — Progress Notes (Signed)
Physical Therapy Treatment Patient Details Name: [name] [name] MRN: [id number] DOB: [birth year] Today's Date: 02/17/2023   History of Present Illness Pt is an 89 y.o. female admitted 12/24 for acute on chronic respiratory failure with hypoxia. PMH:  HTN, hyperlipidemia, GERD, CKD 4, hypothyroidism, seizure disorder, DM, anemia, RLS, obesity, OSA, chronic respiratory failure, aortic stenosis, diastolic CHF, gout    PT Comments  Pt reports she is feeling much better. Mod I bed mobility, supervision transfers, and CGA amb 150' with RW. Pt on 2L continuous O2. SpO2 97% at rest and 90% during amb. 1/4 DOE. Resting HR 104. Max HR 118. Pt returned to sitting EOB at end of session. Current POC remains appropriate.     If plan is discharge home, recommend the following: Assistance with cooking/housework;A little help with walking and/or transfers;A little help with bathing/dressing/bathroom   Can travel by private vehicle        Equipment Recommendations  Other (comment) (home O2)    Recommendations for Other Services       Precautions / Restrictions Precautions Precautions: Other (comment);Fall Precaution Comments: watch vitals Restrictions Weight Bearing Restrictions Per Provider Order: No     Mobility  Bed Mobility Overal bed mobility: Modified Independent                  Transfers Overall transfer level: Needs assistance Equipment used: Rolling walker (2 wheels) Transfers: Sit to/from Stand Sit to Stand: Supervision                Ambulation/Gait Ambulation/Gait assistance: Contact guard assist Gait Distance (Feet): 150 Feet Assistive device: Rolling walker (2 wheels) Gait Pattern/deviations: Step-through pattern, Decreased stride length Gait velocity: decreased Gait velocity interpretation: <1.31 ft/sec, indicative of household ambulator   General Gait Details: Steady gait with RW. Amb on 2L with SpO2 90%. HR max 118. 1/4 DOE   Dealer Bed    Modified Rankin (Stroke Patients Only)       Balance Overall balance assessment: Needs assistance Sitting-balance support: No upper extremity supported, Feet supported Sitting balance-Leahy Scale: Good     Standing balance support: During functional activity, Reliant on assistive device for balance, Bilateral upper extremity supported Standing balance-Leahy Scale: Poor                              Cognition Arousal: Alert Behavior During Therapy: WFL for tasks assessed/performed Overall Cognitive Status: Within Functional Limits for tasks assessed                                          Exercises      General Comments General comments (skin integrity, edema, etc.): SpO2 97% at rest on 2L and 90% during amb. Resting HR 104. Max HR 118.      Pertinent Vitals/Pain Pain Assessment Pain Assessment: No/denies pain    Home Living                          Prior Function            PT Goals (current goals can now be found in the care plan section) Acute Rehab PT Goals Patient Stated Goal: walk to dining room for meals  at [facility] Progress towards PT goals: Progressing toward goals    Frequency    Min 1X/week      PT Plan      Co-evaluation              AM-PAC PT "6 Clicks" Mobility   Outcome Measure  Help needed turning from your back to your side while in a flat bed without using bedrails?: None Help needed moving from lying on your back to sitting on the side of a flat bed without using bedrails?: None Help needed moving to and from a bed to a chair (including a wheelchair)?: A Little Help needed standing up from a chair using your arms (e.g., wheelchair or bedside chair)?: A Little Help needed to walk in hospital room?: A Little Help needed climbing 3-5 steps with a railing? : A Lot 6 Click Score: 19    End of Session Equipment Utilized During Treatment: Gait  belt;Oxygen Activity Tolerance: Patient tolerated treatment well Patient left: in bed;with call bell/phone within reach Nurse Communication: Mobility status PT Visit Diagnosis: Difficulty in walking, not elsewhere classified (R26.2);Muscle weakness (generalized) (M62.81)     Time: 1478-2956 PT Time Calculation (min) (ACUTE ONLY): 15 min  Charges:    $Gait Training: 8-22 mins PT General Charges $$ ACUTE PT VISIT: 1 Visit                     Ferd Glassing., PT  Office # [phone]    Ilda Foil 02/17/2023, 10:19 AM

## 2023-02-17 NOTE — Progress Notes (Signed)
Mobility Specialist Progress Note:   02/17/23 1132  Mobility  Activity Ambulated with assistance in hallway  Level of Assistance Contact guard assist, steadying assist  Assistive Device Front wheel walker  Distance Ambulated (ft) 120 ft  Activity Response Tolerated well  Mobility Referral Yes  Mobility visit 1 Mobility  Mobility Specialist Start Time (ACUTE ONLY) 1115  Mobility Specialist Stop Time (ACUTE ONLY) 1130  Mobility Specialist Time Calculation (min) (ACUTE ONLY) 15 min   Pre Mobility: 100 HR , 96% SpO2 RA During Mobility: 115 HR , 84% - 91% SpO2 RA- 1 L Post Mobility: 103 HR ,  97% SpO2 1 L  Pt received in bed, agreeable to mobility. Desat to 84% on RA during ambulation, with pursed lip breathing SpO2 peaked at 86% with good pleth on RA. Pt c/o SOB during session. VSS on 1 L with SpO2 in low 90's. Pt returned to bed with call bell in reach and all needs met. RN notified.  Leory Plowman  Mobility Specialist Please contact via [employer] office at [phone]

## 2023-02-17 NOTE — Care Management Important Message (Signed)
Important Message  Patient Details  Name: [name] [name] MRN: [id number] Date of Birth: 1932-10-18   Important Message Given:  Yes - Medicare IM     Renie Ora 02/17/2023, 8:49 AM

## 2023-02-17 NOTE — TOC Transition Note (Signed)
Transition of Care (TOC) - Discharge Note Donn Pierini RN, BSN Transitions of Care Unit 4E- RN Case Manager See Treatment Team for direct phone #   Patient Details  Name: [name] [name] MRN: [id number] Date of Birth: [birth year]  Transition of Care [facility]) CM/SW Contact:  Darrold Span, RN Phone Number: 02/17/2023, 12:04 PM   Clinical Narrative:    Pt stable for transition home today- pt is from [facility].  [facility]- [phone]-- spoke with Donnamae Jude- RN at [facility]F to discuss transition needs for HHRN/PT/OT- per Donnamae Jude pt will have RN at ALF and they have in house therapy with [facility] request that orders be faxed to her and she will then send on to the in house therapy office for PT/OT needs.   Confirmed with Donnamae Jude- pt's PCP is Dr. Eloisa Northern who is the medical director there at [facility].  Preferred provider for outpt PC at [facility] is [facility]- CM will made referral for outpt PC to [facility].   Per Donnamae Jude pt was not on home 02 PTA- will need order for home 02- per Keshia they prefer to use [facility] for 02 needs.  Order has been placed and referral to [facility] liaison- Mitch given for home 02 needs.   CM spoke with pt and confirmed pt prefers son to transport her back to [facility]- updated pt on home 02 referral per [facility] preferred agency- pt agreeable - [facility] to bring portable 02 for transport Also discussed therapy arrangements and outpt palliative- pt agreeable per [facility] preferred providers.   HH, 02 orders and d/c summary faxed to Keshia at [phone]   Final next level of care: Assisted Living Barriers to Discharge: Barriers Resolved   Patient Goals and CMS Choice Patient states their goals for this hospitalization and ongoing recovery are:: return to [facility].gov Compare Post Acute Care list provided to:: Patient Choice offered to / list presented to : Patient      Discharge Placement                ALF- [facility]        Discharge Plan and Services Additional resources added to the After Visit Summary for   In-house Referral: Clinical Social Work Discharge Planning Services: CM Consult Post Acute Care Choice: Home Health, Durable Medical Equipment          DME Arranged: Oxygen DME Agency: [facility] Date DME Agency Contacted: 02/17/23 Time DME Agency Contacted: 1200 Representative spoke with at DME Agency: Mitch HH Arranged: RN, PT, OT [facility] Agency: Other - See comment (In house at [facility]) Date HH Agency Contacted: 02/17/23   Representative spoke with at [facility] Agency: Donnamae Jude- Rn at [place] to refer inhouse  Social Drivers of Health (SDOH) Interventions SDOH Screenings   Food Insecurity: No Food Insecurity (02/11/2023)  Housing: Unknown (02/11/2023)  Transportation Needs: No Transportation Needs (02/11/2023)  Utilities: Not At Risk (02/11/2023)  Depression (PHQ2-9): Low Risk  (03/01/2022)  Tobacco Use: Medium Risk (02/11/2023)     Readmission Risk Interventions    02/17/2023   12:04 PM  Readmission Risk Prevention Plan  Transportation Screening Complete  PCP or Specialist Appt within 5-7 Days Complete  Home Care Screening Complete  Medication Review (RN CM) Complete

## 2023-02-17 NOTE — TOC Initial Note (Signed)
Transition of Care [facility]) - Initial/Assessment Note    Patient Details  Name: [name] [name] MRN: [id number] Date of Birth: 08/05/1932  Transition of Care [facility]) CM/SW Contact:    Eduard Roux, LCSW Phone Number: 02/17/2023, 11:50 AM  Clinical Narrative:           CSW spoke with patient's son, Onalee Hua, he confirmed patient is from ALF [facility]. He will provide transportation to ALF.       Expected Discharge Plan: Assisted Living     Patient Goals and CMS Choice            Expected Discharge Plan and Services In-house Referral: Clinical Social Work     Living arrangements for the past 2 months: Assisted Living Facility Expected Discharge Date: 02/17/23                                    Prior Living Arrangements/Services Living arrangements for the past 2 months: Assisted Living Facility Lives with:: Self, Facility Resident Patient language and need for interpreter reviewed:: No        Need for Family Participation in Patient Care: Yes (Comment) Care giver support system in place?: Yes (comment)   Criminal Activity/Legal Involvement Pertinent to Current Situation/Hospitalization: No - Comment as needed  Activities of Daily Living      Permission Sought/Granted                  Emotional Assessment       Orientation: : Oriented to Self, Oriented to Place, Oriented to  Time, Oriented to Situation Alcohol / Substance Use: Not Applicable Psych Involvement: No (comment)  Admission diagnosis:  Acute respiratory failure with hypoxia (HCC) [J96.01] Acute on chronic respiratory failure with hypoxia (HCC) [J96.21] Pulmonary hypertension (HCC) [I27.20] Patient Active Problem List   Diagnosis Date Noted   MPN (myeloproliferative neoplasm) (HCC) 02/13/2023   Symptomatic anemia 02/13/2023   Pulmonary hypertension (HCC) 02/12/2023   Acute on chronic respiratory failure with hypoxia (HCC) 02/11/2023   Restless leg syndrome 07/02/2022    Tachycardia 04/09/2022   Paronychia of finger, right 04/02/2022   Respiratory failure with hypoxia (HCC) 04/02/2022   PICC (peripherally inserted central catheter) in place 01/23/2022   Subacute bacterial endocarditis 01/23/2022   Medication management 01/23/2022   Generalized weakness 01/03/2022   Fall at home, initial encounter 01/03/2022   Allergic rhinitis 01/03/2022   Aortic stenosis 01/03/2022   Acute on chronic diastolic CHF (congestive heart failure) (HCC) 01/03/2022   Bacteremia due to group B Streptococcus 10/18/2021   Acute respiratory failure with hypoxia (HCC) 10/14/2021   Benign neoplasm of cecum    Benign neoplasm of rectum    Blood in stool    Melena    Acute blood loss anemia    GI bleed 04/04/2020   Anemia of chronic disease 11/02/2019   Aortic valve thickening by Cardiac echo in 2015 11/01/2019   Iron deficiency anemia 05/10/2019   Secondary hyperparathyroidism of renal origin (HCC) 09/20/2018   Type 2 diabetes mellitus with diabetic chronic kidney disease (HCC) 12/08/2017   ACE inhibitor intolerance 12/08/2017   Vertigo of central origin 11/20/2015   Morbid obesity (HCC) BMI 35+ with T2DM, htn, hyperlipidemia, CKD, OSA 12/12/2014   OSA on CPAP 08/08/2014   Hyperlipidemia associated with type 2 diabetes mellitus (HCC) 06/27/2013   Vitamin D deficiency 06/27/2013   Seizure disorder (HCC) 11/04/2012   Anemia 06/13/2008  COLONIC POLYPS 06/09/2008   Acquired hypothyroidism 06/09/2008   CKD stage 4 due to type 2 diabetes mellitus (HCC) 06/09/2008   Venous (peripheral) insufficiency 06/09/2008   Gastroesophageal reflux disease 06/09/2008   Essential hypertension 06/09/2008   PCP:  Eloisa Northern, MD Pharmacy:   [facility] [address] [place] [address] Phone: [phone] Fax: [phone]     Social Drivers of Health (SDOH) Social History: SDOH Screenings   Food Insecurity: No Food Insecurity (02/11/2023)  Housing:  Unknown (02/11/2023)  Transportation Needs: No Transportation Needs (02/11/2023)  Utilities: Not At Risk (02/11/2023)  Depression (PHQ2-9): Low Risk  (03/01/2022)  Tobacco Use: Medium Risk (02/11/2023)   SDOH Interventions:     Readmission Risk Interventions     No data to display

## 2023-02-17 NOTE — Progress Notes (Signed)
This chaplain is present for F/U spiritual care in the setting of notarizing the Pt. Advance Directive:  HCPOA and Living Will. The Pt. answered the chaplain's clarifying questions from previous NP education.  The chaplain is present with the Pt., notary, witnesses for the notarizing of the Pt. AD: HCPOA and Living Will.  The Pt. named Babbette Wysong as her healthcare agent. If this person is unable or unwilling to serve in this role the Pt. next choice is Otis Peak.  The chaplain gave the Pt. the original AD along with three copies. The chaplain scanned the Pt. AD into the Pt. EMR.  This chaplain is available for F/U spiritual care as needed.  Chaplain Stephanie Acre [phone]

## 2023-02-17 NOTE — Progress Notes (Addendum)
Patient Name: [name] [name] Date of Encounter: 02/17/2023 [facility] Cardiologist: Armanda Magic, MD  Advanced Heart Failure: Arvilla Meres, MD   Interval Summary  .    Wt down 4 kg since admit, breathing much better  She feels ready to go home, has been on O2 before, but was gradually able to wean off. Willing to go home on O2 now.  Reiterates that she does not want any further eval of her AS.   Vital Signs .    Vitals:   02/16/23 2334 02/17/23 0338 02/17/23 0619 02/17/23 0744  BP: (!) 136/51 (!) 119/45  (!) 123/55  Pulse: 100 94  90  Resp: 17 17  18   Temp: 98 F (36.7 C) 97.9 F (36.6 C)  97.6 F (36.4 C)  TempSrc: Oral Oral  Oral  SpO2: 98% 91%  96%  Weight:   71.6 kg   Height:        Intake/Output Summary (Last 24 hours) at 02/17/2023 1103 Last data filed at 02/17/2023 7106 Gross per 24 hour  Intake 360 ml  Output 1950 ml  Net -1590 ml      02/17/2023    6:19 AM 02/16/2023    3:44 AM 02/12/2023    3:29 AM  Last 3 Weights  Weight (lbs) 157 lb 14.4 oz 160 lb 9.6 oz 164 lb 3.9 oz  Weight (kg) 71.623 kg 72.848 kg 74.5 kg      Telemetry/ECG    SR - Personally Reviewed  CV Studies    Echocardiogram 02/11/2023  1. Left ventricular ejection fraction, by estimation, is 70 to 75%. The left ventricle has hyperdynamic function. The left ventricle has no regional wall motion abnormalities. Left ventricular diastolic parameters are consistent with Grade II diastolic  dysfunction (pseudonormalization). There is the interventricular septum is flattened in systole, consistent with right ventricular pressure overload.   2. Right ventricular systolic function is mildly reduced. The right  ventricular size is moderately enlarged. Mildly increased right  ventricular wall thickness. There is severely elevated pulmonary artery systolic pressure. The estimated right ventricular systolic pressure is 79.9 mmHg.   3. Left atrial size was mildly dilated.   4.  Right atrial size was moderately dilated.   5. The mitral valve is normal in structure. Trivial mitral valve  regurgitation. Mild mitral stenosis. Moderate mitral annular  calcification.   6. Tricuspid valve regurgitation is moderate.   7. The aortic valve is tricuspid. There is severe calcifcation of the aortic valve. There is severe thickening of the aortic valve. Aortic valve regurgitation is trivial. Severe aortic valve stenosis. Aortic valve mean gradient measures 39.5 mmHg.  Aortic valve Vmax measures 4.07 m/s. Aortic valve acceleration time measures 95 msec.   8. The inferior vena cava is dilated in size with >50% respiratory variability, suggesting right atrial pressure of 8 mmHg.   Comparison(s): Prior images reviewed side by side. Aortic stenosis gadients are higher, but this is largely due to increased cardiac output.    Physical Exam .    General: Well developed, elderly, female in no acute distress Head: Eyes PERRLA, Head normocephalic and atraumatic Lungs: decreased BS bases bilaterally to auscultation. Heart: HRRR S1 S2, without rub or gallop. 2-3/6 murmur. 4/4 extremity pulses are 2+ & equal. JVD 10 cm Abdomen: Bowel sounds are present, abdomen soft and non-tender without masses or  hernias noted. Msk: Normal strength and tone for age. Extremities: No clubbing, cyanosis or edema.    Skin:  No rashes or  lesions noted. Neuro: Alert and oriented X 3. Psych:  Good affect, responds appropriately   Patient Profile    [name] [name] is a 89 y.o. female has hx of  aortic stenosis, chronic HFpEF, pulmonary hypertension, JAK2 positive myeloproliferative neoplasm followed by oncology, hypertension, hyperlipidemia, CKD stage IV, hypothyroidism, seizure disorder, diabetes, anemia, OSA,  and admitted on 02/12/2019 for for the evaluation of shortness of breath.  Assessment & Plan .     Acute on chronic respiratory failure with hypoxia  This is multifactorial likely due to her  worsening anemia maybe secondary to MPN, metabolic acidosis, severe valvular disease with pulmonary hypertension.  These are all likely to advance and with her advanced age.  Palliative care has seen, Adv Directives in place.  She would opt for more conservative management.  VQ scan negative Wt down 5 kg since admit, wt now 157.9 lbs, the lowest her weight has been in > 1 yr Now back on oral Lasix at 80 mg every day (pta on 40 mg every day)   Chronic HFpEF Pulmonary hypertension likely WHO group 3 Severe AS - new to Cards this admit - Echo report is above. She has hyperdynamic LVEF 70-75%, likely compensating.  Grade 2 diastolic dysfunction with interventricular septum flattening with mildly reduced RV function and RVSP of 79.9. Severe aortic stenosis with a mean gradient 39.5, prior echocardiogram in November 2023 mean gradient was 36, slowly progressing. - Given that she is very functional could possibly be a TAVR candidate  - she has voiced a preference for conservative management and deferment of this.    Acute on chronic HFPEF Now back on po Lasix 80 mg every day, increased from home dose 40 mg every day Ck BMET at f/u she was on Farxiga pta, held on admit and not restarted at d/c. No renal restrictions and no procedures planned, discuss w/ MD if we should restart it.    JAK2 positive myeloproliferative neoplasm Per oncology notes no indications for treatment.    CKD - Cr 2.87 01/28/2023, was 2.58 on admission, was 1.68 March 2024 - Cr has trended down since admit and is now 2.02   Anemia Hgb June it was 12.4.  Currently 9.9-8.2-7.4-7.6- 7.5- 8.4- 7.4- 7.6 Oncology recommends aggressive IV iron repletion.  OSA - On CPAP here, not at home.  - management of this per IM   Seizure disorder - On Keppra 500 mg twice daily   Diabetes - Last A1c 7.2%  For questions or updates, please contact [facility] Please consult [email or web address] for contact info under   Theodore Demark, PA-C   Patient seen and examined with RB PA-C.  Agree as above, with the following exceptions and changes as noted below. Pt sitting up on side of bed just finishing working with mobility team.  Is anticipating discharge home gen: NAD, CV: RRR, 3/6  SEM, Lungs: clear, Abd: soft, Extrem: Warm, well perfused, no edema, Neuro/Psych: alert and oriented x 3, normal mood and affect. All available labs, radiology testing, previous records reviewed.  Patient has severe aortic valve stenosis and murmur on exam is consistent with this.  She has reiterated to several care team members that she does not want further evaluation of her aortic valve stenosis, this can be followed by her outpatient cardiologist if symptoms progress further.  Remainder.  Parke Poisson, MD 02/17/23 11:33 AM

## 2023-02-17 NOTE — TOC Transition Note (Addendum)
Transition of Care [facility]) - Discharge Note   Patient Details  Name: [name] [name] MRN: [id number] Date of Birth: Jul 29, 1932  Transition of Care [facility]) CM/SW Contact:  Eduard Roux, LCSW Phone Number: 02/17/2023, 12:15 PM   Clinical Narrative:     Patient will Discharge to: [facility] Discharge Date: 02/17/2023 Family Notified: son Transport By: son/car   Per MD patient is ready for discharge. RN, patient, and facility notified of discharge. Discharge Summary sent to facility. RN given number for [phone] ( you may be transferred, ask for ALF Nurse for the patient). \  Clinical Social Worker signing off.  Antony Blackbird, MSW, LCSW Clinical Social Worker     Final next level of care: Assisted Living Barriers to Discharge: Barriers Resolved   Patient Goals and CMS Choice Patient states their goals for this hospitalization and ongoing recovery are:: return to [email or web address].gov Compare Post Acute Care list provided to:: Patient Choice offered to / list presented to : Patient      Discharge Placement                       Discharge Plan and Services Additional resources added to the After Visit Summary for   In-house Referral: Clinical Social Work Discharge Planning Services: CM Consult Post Acute Care Choice: Home Health, Durable Medical Equipment          DME Arranged: Oxygen DME Agency: [employer] Date DME Agency Contacted: 02/17/23 Time DME Agency Contacted: 1200 Representative spoke with at DME Agency: Mitch HH Arranged: RN, PT, OT [facility] Agency: Other - See comment (In house at [facility]) Date HH Agency Contacted: 02/17/23   Representative spoke with at [facility] Agency: Donnamae Jude- Rn at [place] to refer inhouse  Social Drivers of Health (SDOH) Interventions SDOH Screenings   Food Insecurity: No Food Insecurity (02/11/2023)  Housing: Unknown (02/11/2023)  Transportation Needs: No Transportation Needs (02/11/2023)   Utilities: Not At Risk (02/11/2023)  Depression (PHQ2-9): Low Risk  (03/01/2022)  Tobacco Use: Medium Risk (02/11/2023)     Readmission Risk Interventions    02/17/2023   12:04 PM  Readmission Risk Prevention Plan  Transportation Screening Complete  PCP or Specialist Appt within 5-7 Days Complete  Home Care Screening Complete  Medication Review (RN CM) Complete

## 2023-02-17 NOTE — Progress Notes (Signed)
Nurse requested Mobility Specialist to perform oxygen saturation test with pt which includes removing pt from oxygen both at rest and while ambulating.  Below are the results from that testing.     Patient Saturations on Room Air at Rest = spO2 96%  Patient Saturations on Room Air while Ambulating = sp02 84% .  Rested and performed pursed lip breathing for 1 minute with sp02 at 86%.  Patient Saturations on 1 Liters of oxygen while Ambulating = sp02 91%  At end of testing pt left in room on 1  Liters of oxygen.  Reported results to nurse.

## 2023-02-17 NOTE — Plan of Care (Signed)
  Problem: Education: Goal: Knowledge of General Education information will improve Description: Including pain rating scale, medication(s)/side effects and non-pharmacologic comfort measures Outcome: Progressing   Problem: Clinical Measurements: Goal: Ability to maintain clinical measurements within normal limits will improve Outcome: Progressing Goal: Will remain free from infection Outcome: Progressing Goal: Diagnostic test results will improve Outcome: Progressing Goal: Respiratory complications will improve Outcome: Progressing Goal: Cardiovascular complication will be avoided Outcome: Progressing   Problem: Health Behavior/Discharge Planning: Goal: Ability to manage health-related needs will improve Outcome: Progressing   Problem: Safety: Goal: Ability to remain free from injury will improve Outcome: Progressing   Problem: Skin Integrity: Goal: Risk for impaired skin integrity will decrease Outcome: Progressing   Problem: Coping: Goal: Ability to adjust to condition or change in health will improve Outcome: Progressing

## 2023-02-17 NOTE — Progress Notes (Signed)
   Palliative Medicine Inpatient Follow Up Note HPI: [name] [name] is a 89 y.o. female with a hx of aortic stenosis, chronic HFpEF, pulmonary hypertension, JAK2 positive myeloproliferative neoplasm followed by oncology, hypertension, hyperlipidemia, CKD stage IV, hypothyroidism, seizure disorder, diabetes, anemia, & OSA. Palliative care has been asked to address goals of care in the setting of end stage pulmonary hypertension.    Today's Discussion 02/17/2023  *Please note that this is a verbal dictation therefore any spelling or grammatical errors are due to the "Dragon Medical One" system interpretation.  Chart reviewed inclusive of vital signs, progress notes, laboratory results, and diagnostic images.   I met with [name] [name] at bedside this morning. She shares the plan for her to transition home later in the day. Discussed how much better she is feeling since admission.   We discussed the formalization of her advanced directives which she agrees needs to be done.   [name] [name] remains agreeable to OP Palliative support at this time.   Questions and concerns addressed/Palliative Support Provided.   Objective Assessment: Vital Signs Vitals:   02/17/23 0744 02/17/23 1114  BP: (!) 123/55 (!) 129/56  Pulse: 90 100  Resp: 18 18  Temp: 97.6 F (36.4 C) (!) 97.3 F (36.3 C)  SpO2: 96% 98%    Intake/Output Summary (Last 24 hours) at 02/17/2023 1317 Last data filed at 02/17/2023 1610 Gross per 24 hour  Intake 120 ml  Output 1300 ml  Net -1180 ml   Last Weight  Most recent update: 02/17/2023  6:20 AM    Weight  71.6 kg (157 lb 14.4 oz)            Gen: Elderly Caucasian female in no acute distress HEENT: moist mucous membranes CV: Regular rate and rhythm PULM: On 2L nasal cannula breathing appears even and nonlabored ABD: soft/nontender EXT: No edema Neuro: Alert and oriented x3  SUMMARY OF RECOMMENDATIONS   DNAR/DNI   Advanced Directives completed  Plan for  transition home today with OP Palliative support  Billing based on MDM: Low ______________________________________________________________________________________ Lamarr Lulas [facility] Palliative Medicine Team Team Cell Phone: [phone] Please utilize secure chat with additional questions, if there is no response within 30 minutes please call the above phone number  Palliative Medicine Team providers are available by phone from 7am to 7pm daily and can be reached through the team cell phone.  Should this patient require assistance outside of these hours, please call the patient's attending physician.

## 2023-02-17 NOTE — [place] FL2 (Addendum)
[place] MEDICAID FL2 LEVEL OF CARE FORM     IDENTIFICATION  Patient Name: [name] [name] Birthdate: Nov 16, 1932 Sex: female Admission Date (Current Location): 02/11/2023  [facility] and [place] Number:  Producer, television/film/video and Address:  [facility] [place], [address], [place], [place] [address]      Provider Number: [id number]  Attending Physician Name and Address:  Alberteen Sam, *  Relative Name and Phone Number:       Current Level of Care: Hospital Recommended Level of Care: Assisted Living Facility Prior Approval Number:    Date Approved/Denied:   PASRR Number:    Discharge Plan: Other (Comment) (ALF)    Current Diagnoses: Patient Active Problem List   Diagnosis Date Noted   MPN (myeloproliferative neoplasm) (HCC) 02/13/2023   Symptomatic anemia 02/13/2023   Pulmonary hypertension (HCC) 02/12/2023   Acute on chronic respiratory failure with hypoxia (HCC) 02/11/2023   Restless leg syndrome 07/02/2022   Tachycardia 04/09/2022   Paronychia of finger, right 04/02/2022   Respiratory failure with hypoxia (HCC) 04/02/2022   PICC (peripherally inserted central catheter) in place 01/23/2022   Subacute bacterial endocarditis 01/23/2022   Medication management 01/23/2022   Generalized weakness 01/03/2022   Fall at home, initial encounter 01/03/2022   Allergic rhinitis 01/03/2022   Aortic stenosis 01/03/2022   Acute on chronic diastolic CHF (congestive heart failure) (HCC) 01/03/2022   Bacteremia due to group B Streptococcus 10/18/2021   Acute respiratory failure with hypoxia (HCC) 10/14/2021   Benign neoplasm of cecum    Benign neoplasm of rectum    Blood in stool    Melena    Acute blood loss anemia    GI bleed 04/04/2020   Anemia of chronic disease 11/02/2019   Aortic valve thickening by Cardiac echo in 2015 11/01/2019   Iron deficiency anemia 05/10/2019   Secondary hyperparathyroidism of renal origin (HCC) 09/20/2018   Type 2  diabetes mellitus with diabetic chronic kidney disease (HCC) 12/08/2017   ACE inhibitor intolerance 12/08/2017   Vertigo of central origin 11/20/2015   Morbid obesity (HCC) BMI 35+ with T2DM, htn, hyperlipidemia, CKD, OSA 12/12/2014   OSA on CPAP 08/08/2014   Hyperlipidemia associated with type 2 diabetes mellitus (HCC) 06/27/2013   Vitamin D deficiency 06/27/2013   Seizure disorder (HCC) 11/04/2012   Anemia 06/13/2008   COLONIC POLYPS 06/09/2008   Acquired hypothyroidism 06/09/2008   CKD stage 4 due to type 2 diabetes mellitus (HCC) 06/09/2008   Venous (peripheral) insufficiency 06/09/2008   Gastroesophageal reflux disease 06/09/2008   Essential hypertension 06/09/2008    Orientation RESPIRATION BLADDER Height & Weight     Self, Time, Situation, Place  O2 Continent Weight: 157 lb 14.4 oz (71.6 kg) Height:  5\' 2"  (157.5 cm)  BEHAVIORAL SYMPTOMS/MOOD NEUROLOGICAL BOWEL NUTRITION STATUS      Continent Diet (Regular diet)  AMBULATORY STATUS COMMUNICATION OF NEEDS Skin   Limited Assist Verbally Normal                       Personal Care Assistance Level of Assistance  Bathing, Feeding, Dressing Bathing Assistance: Limited assistance Feeding assistance: Independent Dressing Assistance: Limited assistance     Functional Limitations Info  Sight, Hearing, Speech Sight Info: Impaired (eye glasses) Hearing Info: Impaired Speech Info: Adequate    SPECIAL CARE FACTORS FREQUENCY  PT (By licensed PT), OT (By licensed OT)     PT Frequency: 5x per week OT Frequency: 5x per week  Contractures Contractures Info: Not present    Additional Factors Info  Code Status, Allergies Code Status Info: DNR Allergies Info: tuberculin Purified Protein Derivative,Penicillins           Current Medications (02/17/2023):  This is the current hospital active medication list Current Facility-Administered Medications  Medication Dose Route Frequency Provider Last Rate Last  Admin   acetaminophen (TYLENOL) tablet 650 mg  650 mg Oral Q6H PRN Synetta Fail, MD   650 mg at 02/16/23 2228   Or   acetaminophen (TYLENOL) suppository 650 mg  650 mg Rectal Q6H PRN Synetta Fail, MD       allopurinol (ZYLOPRIM) tablet 100 mg  100 mg Oral BID Synetta Fail, MD   100 mg at 02/17/23 5284   ferrous sulfate tablet 325 mg  325 mg Oral Debera Lat, MD   325 mg at 02/17/23 1324   furosemide (LASIX) tablet 80 mg  80 mg Oral Daily Chrystie Nose, MD   80 mg at 02/17/23 4010   heparin injection 5,000 Units  5,000 Units Subcutaneous Q8H Synetta Fail, MD   5,000 Units at 02/17/23 2725   insulin aspart (novoLOG) injection 0-15 Units  0-15 Units Subcutaneous TID WC Alberteen Sam, MD   5 Units at 02/17/23 1134   insulin aspart (novoLOG) injection 0-5 Units  0-5 Units Subcutaneous QHS Alberteen Sam, MD       levETIRAcetam (KEPPRA) tablet 500 mg  500 mg Oral BID Synetta Fail, MD   500 mg at 02/17/23 3664   levothyroxine (SYNTHROID) tablet 112 mcg  112 mcg Oral q AM Synetta Fail, MD   112 mcg at 02/17/23 0622   linagliptin (TRADJENTA) tablet 5 mg  5 mg Oral Daily Alberteen Sam, MD   5 mg at 02/17/23 4034   melatonin tablet 3 mg  3 mg Oral QHS Synetta Fail, MD   3 mg at 02/16/23 2225   pantoprazole (PROTONIX) EC tablet 20 mg  20 mg Oral Daily Synetta Fail, MD   20 mg at 02/17/23 7425   polyethylene glycol (MIRALAX / GLYCOLAX) packet 17 g  17 g Oral Daily PRN Synetta Fail, MD   17 g at 02/16/23 0854   rOPINIRole (REQUIP) tablet 0.5 mg  0.5 mg Oral QHS Synetta Fail, MD   0.5 mg at 02/16/23 2225   sodium chloride flush (NS) 0.9 % injection 3 mL  3 mL Intravenous Q12H Synetta Fail, MD   3 mL at 02/16/23 2200     Discharge Medications:  acetaminophen 500 MG tablet Commonly known as: TYLENOL Take 1,000 mg by mouth every 6 (six) hours as needed for moderate pain.    allopurinol  100 MG tablet Commonly known as: ZYLOPRIM Take 1 tablet (100 mg total) by mouth 2 (two) times daily. Take  1 tablet  Daily  to prevent Gout. What changed: additional instructions    ascorbic acid 500 MG tablet Commonly known as: VITAMIN C Take 500 mg by mouth in the morning.    aspirin 81 MG tablet Take 81 mg by mouth in the morning.    cholecalciferol 25 MCG (1000 UNIT) tablet Commonly known as: VITAMIN D3 Take 3,000 Units by mouth every evening.    ferrous sulfate 325 (65 FE) MG tablet Take 1 tablet (325 mg total) by mouth every other day. Start taking on: February 19, 2023    fish oil-omega-3 fatty acids 1000 MG capsule  Take 1,000 mg by mouth at bedtime.    furosemide 80 MG tablet Commonly known as: LASIX Take 1 tablet (80 mg total) by mouth daily. Start taking on: February 18, 2023 What changed:  medication strength how much to take when to take this    lansoprazole 30 MG capsule Commonly known as: PREVACID TAKE 1 CAPSULE BY MOUTH DAILY TO PREVENT HEARTBURN AND INDIGESTION. What changed:  how much to take how to take this when to take this additional instructions    levETIRAcetam 500 MG tablet Commonly known as: KEPPRA Take  1 tablet 2  x /day  with Meals  to Prevent Seizures What changed:  how much to take how to take this when to take this additional instructions    levothyroxine 112 MCG tablet Commonly known as: SYNTHROID Take 112 mcg by mouth in the morning.    loratadine 10 MG tablet Commonly known as: CLARITIN Take 10 mg by mouth in the morning.    meclizine 25 MG tablet Commonly known as: ANTIVERT Take   1 tablet 2 to 3 x /day  ONLY  if needed for Dizziness /Vertigo What changed:  how much to take how to take this when to take this reasons to take this additional instructions    melatonin 3 MG Tabs tablet Take 3 mg by mouth at bedtime.    ondansetron 4 MG tablet Commonly known as: ZOFRAN Take 4 mg by mouth every 6 (six) hours as needed  for nausea or vomiting.    rOPINIRole 1 MG tablet Commonly known as: REQUIP TAKE ONE-HALF TO ONE TABLET BY MOUTH THREE TIMES DAILY AS NEEDED FOR RESTLESS LEGS& CRAMPS What changed:  how much to take how to take this when to take this additional instructions    sitaGLIPtin 25 MG tablet Commonly known as: JANUVIA Take 50 mg by mouth in the morning.    vitamin B-12 100 MCG tablet Commonly known as: CYANOCOBALAMIN Take 100 mcg by mouth in the morning.    Relevant Imaging Results:  Relevant Lab Results:   Additional Information SSN [id number]  Eduard Roux, LCSW

## 2023-02-20 DIAGNOSIS — J9601 Acute respiratory failure with hypoxia: Secondary | ICD-10-CM | POA: Diagnosis not present

## 2023-02-20 DIAGNOSIS — I5033 Acute on chronic diastolic (congestive) heart failure: Secondary | ICD-10-CM

## 2023-02-20 DIAGNOSIS — D649 Anemia, unspecified: Secondary | ICD-10-CM | POA: Diagnosis not present

## 2023-02-20 DIAGNOSIS — N184 Chronic kidney disease, stage 4 (severe): Secondary | ICD-10-CM | POA: Diagnosis not present

## 2023-02-20 DIAGNOSIS — E1122 Type 2 diabetes mellitus with diabetic chronic kidney disease: Secondary | ICD-10-CM | POA: Diagnosis not present

## 2023-02-20 DIAGNOSIS — I272 Pulmonary hypertension, unspecified: Secondary | ICD-10-CM

## 2023-02-26 ENCOUNTER — Ambulatory Visit: Payer: Medicare Other

## 2023-02-26 VITALS — BP 124/76 | HR 86 | Temp 97.8°F | Resp 20 | Ht 62.0 in | Wt 162.6 lb

## 2023-02-26 DIAGNOSIS — D509 Iron deficiency anemia, unspecified: Secondary | ICD-10-CM | POA: Diagnosis not present

## 2023-02-26 MED ORDER — IRON SUCROSE 20 MG/ML IV SOLN
200.0000 mg | Freq: Once | INTRAVENOUS | Status: AC
Start: 2023-02-26 — End: 2023-02-26
  Administered 2023-02-26: 200 mg via INTRAVENOUS
  Filled 2023-02-26: qty 10

## 2023-02-26 MED ORDER — ACETAMINOPHEN 325 MG PO TABS
650.0000 mg | ORAL_TABLET | Freq: Once | ORAL | Status: AC
  Administered 2023-02-26: 650 mg via ORAL
  Filled 2023-02-26: qty 2

## 2023-02-26 MED ORDER — DIPHENHYDRAMINE HCL 25 MG PO CAPS
25.0000 mg | ORAL_CAPSULE | Freq: Once | ORAL | Status: AC
  Administered 2023-02-26: 25 mg via ORAL
  Filled 2023-02-26: qty 1

## 2023-02-26 NOTE — Progress Notes (Signed)
 Diagnosis: Iron  Deficiency Anemia  Provider:  Praveen Mannam MD  Procedure: IV Push  IV Type: Peripheral, IV Location: R Hand  Venofer  (Iron  Sucrose), Dose: 200 mg  Post Infusion IV Care: Observation period completed and Peripheral IV Discontinued  Discharge: Condition: Good, Destination: Skilled nursing facility . AVS Provided  Performed by:  Irja Wheless, RN

## 2023-02-26 NOTE — Progress Notes (Deleted)
 Diagnosis: Iron  Deficiency Anemia  Provider:  Praveen Mannam MD  Procedure: IV Infusion  IV Type: Peripheral, IV Location: R Hand  Venofer  (Iron  Sucrose), Dose: 200 mg  {Infusion Start Time:25399}  {Infusion Stop Time:25400}  Post Infusion IV Care: Observation period completed and Peripheral IV Discontinued  Discharge: Condition: Good, Destination: Home . AVS Provided  Performed by:  Skylur Fuston, RN

## 2023-03-04 ENCOUNTER — Ambulatory Visit: Payer: Medicare Other

## 2023-03-04 VITALS — BP 121/73 | HR 81 | Temp 97.6°F | Resp 18 | Ht 62.0 in | Wt 161.0 lb

## 2023-03-04 DIAGNOSIS — D509 Iron deficiency anemia, unspecified: Secondary | ICD-10-CM

## 2023-03-04 MED ORDER — DIPHENHYDRAMINE HCL 25 MG PO CAPS
25.0000 mg | ORAL_CAPSULE | Freq: Once | ORAL | Status: AC
  Administered 2023-03-04: 25 mg via ORAL
  Filled 2023-03-04: qty 1

## 2023-03-04 MED ORDER — ACETAMINOPHEN 325 MG PO TABS
650.0000 mg | ORAL_TABLET | Freq: Once | ORAL | Status: AC
  Administered 2023-03-04: 650 mg via ORAL
  Filled 2023-03-04: qty 2

## 2023-03-04 MED ORDER — IRON SUCROSE 20 MG/ML IV SOLN
200.0000 mg | Freq: Once | INTRAVENOUS | Status: AC
  Administered 2023-03-04: 200 mg via INTRAVENOUS
  Filled 2023-03-04: qty 10

## 2023-03-04 NOTE — Progress Notes (Signed)
 Diagnosis: Iron  Deficiency Anemia  Provider:  Mannam, Praveen MD  Procedure: IV Push  IV Type: Peripheral, IV Location: R Hand  Venofer  (Iron  Sucrose), Dose: 200 mg  Post Infusion IV Care: Observation period completed and Peripheral IV Discontinued  Discharge: Condition: Stable, Destination: Skilled nursing facility . AVS Provided (placed in facility packet)  Performed by:  Rocky FORBES Sar, RN

## 2023-03-06 NOTE — Progress Notes (Signed)
Cardiology Office Note:  .   Date:  03/07/2023  ID:  [name] [name], DOB 1932/10/13, MRN [id number] PCP: Eloisa Northern, MD  [facility] Providers Cardiologist:  Armanda Magic, MD Advanced Heart Failure:  Arvilla Meres, MD { History of Present Illness: .   [name] [name] is a 89 y.o. female with a past medical history of diastolic CHF, severe AS, pulmonary hypertension, DM, MPN and JAK2, iron deficiency anemia/chronic on iron infusions, CKD stage IV baseline creatinine 2.2, hypothyroidism, seizures, OSA who presents for cardiology follow-up appointment.  Was recently in the hospital and presented with CHF flare in the setting of advanced AAS and pulmonary hypertension.  She was admitted and started on IV Lasix.  VQ scan negative.  Echo with preserved EF but severe AAS and severe pulmonary hypertension with grade 2 diastolic dysfunction.  Cardiology was consulted and they stopped amlodipine and metoprolol due to soft blood pressures.  Diuresed 7.5 L.  Was weaned down to 2 L oxygen and stable.  Marcelline Deist held as well due to soft BP.  Discussions of a transaortic valve repair but this was deferred for now given concerns of progression of kidney failure.  At the time of discharge she was transition to oral diuretics and her creatinine improved with diuretics.  Fewer symptoms of dyspnea.  Today, she presents with a history of heart disease and kidney disease  for a follow-up after a recent hospitalization. During the hospitalization, she was started on IV Lasix due to fluid overload and was weaned down to two liters of oxygen at discharge. A valve repair was discussed but deferred due to kidney concerns. Since leaving the hospital, the patient reports doing well and continues to use two liters of oxygen as needed. She has a pulse oximeter at home for spot checks and reports no changes in weight or swelling in her feet or legs. She is currently on 80mg  of Lasix daily, up from her previous dose of  40mg . She denies any side effects from her current medications.  The patient resides in an assisted living facility and is currently undergoing physical therapy. She uses a condenser for oxygen, which she uses all night, but has concerns about its functioning. She also has a portable oxygen tank for when she goes to meals or therapy.  The patient has a history of sleep apnea but does not use CPAP, instead opting for nightly oxygen use. She also has a murmur, which has been present for years, and a recent ultrasound showed slightly impaired relaxation of the heart and some thickening of the left ventricle. There was also moderate annular calcification and moderate leak of the tricuspid valve.  Ernestina Columbia (nephrology) next  month to check kidneys.    Reports no chest pain, pressure, or tightness. No edema, orthopnea, PND. Reports no palpitations.   Discussed the use of AI scribe software for clinical note transcription with the patient, who gave verbal consent to proceed.  ROS: pertinent ROS in HPI  Studies Reviewed: .       Echocardiogram 02/11/2023 IMPRESSIONS     1. Left ventricular ejection fraction, by estimation, is 70 to 75%. The  left ventricle has hyperdynamic function. The left ventricle has no  regional wall motion abnormalities. Left ventricular diastolic parameters  are consistent with Grade II diastolic  dysfunction (pseudonormalization). There is the interventricular septum is  flattened in systole, consistent with right ventricular pressure overload.   2. Right ventricular systolic function is mildly reduced. The right  ventricular size is moderately enlarged. Mildly increased right  ventricular wall thickness. There is severely elevated pulmonary artery  systolic pressure. The estimated right  ventricular systolic pressure is 79.9 mmHg.   3. Left atrial size was mildly dilated.   4. Right atrial size was moderately dilated.   5. The mitral valve is normal in  structure. Trivial mitral valve  regurgitation. Mild mitral stenosis. Moderate mitral annular  calcification.   6. Tricuspid valve regurgitation is moderate.   7. The aortic valve is tricuspid. There is severe calcifcation of the  aortic valve. There is severe thickening of the aortic valve. Aortic valve  regurgitation is trivial. Severe aortic valve stenosis. Aortic valve mean  gradient measures 39.5 mmHg.  Aortic valve Vmax measures 4.07 m/s. Aortic valve acceleration time  measures 95 msec.   8. The inferior vena cava is dilated in size with >50% respiratory  variability, suggesting right atrial pressure of 8 mmHg.   Comparison(s): Prior images reviewed side by side. Aortic stenosis  gadients are higher, but this is largely due to increased cardiac output.   FINDINGS   Left Ventricle: Left ventricular ejection fraction, by estimation, is 70  to 75%. The left ventricle has hyperdynamic function. The left ventricle  has no regional wall motion abnormalities. The left ventricular internal  cavity size was normal in size.  There is borderline concentric left ventricular hypertrophy. The  interventricular septum is flattened in systole, consistent with right  ventricular pressure overload. Left ventricular diastolic parameters are  consistent with Grade II diastolic  dysfunction (pseudonormalization).   Right Ventricle: The right ventricular size is moderately enlarged. Mildly  increased right ventricular wall thickness. Right ventricular systolic  function is mildly reduced. There is severely elevated pulmonary artery  systolic pressure. The tricuspid  regurgitant velocity is 4.24 m/s, and with an assumed right atrial  pressure of 8 mmHg, the estimated right ventricular systolic pressure is  79.9 mmHg.   Left Atrium: Left atrial size was mildly dilated.   Right Atrium: Right atrial size was moderately dilated.   Pericardium: There is no evidence of pericardial effusion.    Mitral Valve: The mitral valve is normal in structure. There is mild  thickening of the mitral valve leaflet(s). Moderate mitral annular  calcification. Trivial mitral valve regurgitation. Mild mitral valve  stenosis. MV peak gradient, 14.3 mmHg. The mean  mitral valve gradient is 5.8 mmHg with average heart rate of 85 bpm.   Tricuspid Valve: The tricuspid valve is normal in structure. Tricuspid  valve regurgitation is moderate.   Aortic Valve: The aortic valve is tricuspid. There is severe calcifcation  of the aortic valve. There is severe thickening of the aortic valve.  Aortic valve regurgitation is trivial. Severe aortic stenosis is present.  Aortic valve mean gradient measures  39.5 mmHg. Aortic valve peak gradient measures 66.3 mmHg. Aortic valve  area, by VTI measures 0.99 cm.   Pulmonic Valve: The pulmonic valve was normal in structure. Pulmonic valve  regurgitation is trivial. No evidence of pulmonic stenosis.   Aorta: The aortic root and ascending aorta are structurally normal, with  no evidence of dilitation.   Venous: The inferior vena cava is dilated in size with greater than 50%  respiratory variability, suggesting right atrial pressure of 8 mmHg.   IAS/Shunts: No atrial level shunt detected by color flow Doppler.       Physical Exam:   VS:  BP (!) 126/54   Pulse 82   Ht 5\' 2"  (1.575  m)   Wt 161 lb (73 kg)   SpO2 92%   BMI 29.45 kg/m    Wt Readings from Last 3 Encounters:  03/07/23 161 lb (73 kg)  03/04/23 161 lb (73 kg)  02/26/23 162 lb 9.6 oz (73.8 kg)    GEN: Well nourished, well developed in no acute distress NECK: No JVD; No carotid bruits CARDIAC: RRR, + 5/6systolic murmur radiates to neck and back, rubs, gallops RESPIRATORY:  Clear to auscultation without rales, wheezing or rhonchi  ABDOMEN: Soft, non-tender, non-distended EXTREMITIES:  No edema; No deformity   ASSESSMENT AND PLAN: .   Congestive Heart Failure/HFpEF Recent hospitalization  with fluid overload managed with IV Lasix. Currently on 2 liters of oxygen as needed. No current signs of fluid overload with stable weight and no peripheral edema. Noted heart murmur on auscultation. -Continue Lasix 80mg  daily. -Encourage use of home pulse oximeter to monitor oxygen saturation. -Plan for follow-up with nephrologist in February for kidney function assessment.  Renal disease -recent creatinine 2.0 -plans to follow-up with nephrology next month -continue current diuretic regimen  Valvular Heart Disease Moderate annular calcification and moderate tricuspid regurgitation noted on recent echocardiogram. Severe aortic stenosis. Mild MR, Valve repair deferred due to kidney function. -Continue conservative management with medications. -Consider future valve repair pending improvement in kidney function.  Sleep Apnea Using oxygen concentrator at night. -Ensure concentrator is functioning properly for optimal management of sleep apnea.  General Health Maintenance -Continue current medications including Synthroid, Claritin, and vitamins. -Continue use of compression stockings for venous health. -Physical therapy ongoing, monitor progress.       Dispo: She can follow-up with Dr. Mayford Knife in a few months  Signed, Sharlene Dory, PA-C

## 2023-03-07 ENCOUNTER — Ambulatory Visit: Payer: Medicare Other | Attending: Physician Assistant | Admitting: Physician Assistant

## 2023-03-07 ENCOUNTER — Encounter: Payer: Self-pay | Admitting: Physician Assistant

## 2023-03-07 VITALS — BP 126/54 | HR 82 | Ht 62.0 in | Wt 161.0 lb

## 2023-03-07 DIAGNOSIS — N184 Chronic kidney disease, stage 4 (severe): Secondary | ICD-10-CM

## 2023-03-07 DIAGNOSIS — E1122 Type 2 diabetes mellitus with diabetic chronic kidney disease: Secondary | ICD-10-CM

## 2023-03-07 DIAGNOSIS — D509 Iron deficiency anemia, unspecified: Secondary | ICD-10-CM

## 2023-03-07 DIAGNOSIS — I5030 Unspecified diastolic (congestive) heart failure: Secondary | ICD-10-CM

## 2023-03-07 DIAGNOSIS — G4733 Obstructive sleep apnea (adult) (pediatric): Secondary | ICD-10-CM

## 2023-03-07 DIAGNOSIS — I10 Essential (primary) hypertension: Secondary | ICD-10-CM

## 2023-03-07 NOTE — Patient Instructions (Signed)
    Follow-Up: At [facility], you and your health needs are our priority.  As part of our continuing mission to provide you with exceptional heart care, we have created designated Provider Care Teams.  These Care Teams include your primary Cardiologist (physician) and Advanced Practice Providers (APPs -  Physician Assistants and Nurse Practitioners) who all work together to provide you with the care you need, when you need it.  We recommend signing up for the patient portal called "MyChart".  Sign up information is provided on this After Visit Summary.  MyChart is used to connect with patients for Virtual Visits (Telemedicine).  Patients are able to view lab/test results, encounter notes, upcoming appointments, etc.  Non-urgent messages can be sent to your provider as well.   To learn more about what you can do with MyChart, go to [email or web address].    Your next appointment:   3-4 month(s)  Provider:   Armanda Magic MD

## 2023-04-04 DIAGNOSIS — I350 Nonrheumatic aortic (valve) stenosis: Secondary | ICD-10-CM | POA: Diagnosis not present

## 2023-04-04 DIAGNOSIS — E039 Hypothyroidism, unspecified: Secondary | ICD-10-CM

## 2023-04-04 DIAGNOSIS — J9611 Chronic respiratory failure with hypoxia: Secondary | ICD-10-CM | POA: Diagnosis not present

## 2023-04-04 DIAGNOSIS — I5032 Chronic diastolic (congestive) heart failure: Secondary | ICD-10-CM | POA: Diagnosis not present

## 2023-04-04 DIAGNOSIS — R569 Unspecified convulsions: Secondary | ICD-10-CM | POA: Diagnosis not present

## 2023-05-03 ENCOUNTER — Other Ambulatory Visit: Payer: Self-pay

## 2023-05-03 ENCOUNTER — Encounter (HOSPITAL_BASED_OUTPATIENT_CLINIC_OR_DEPARTMENT_OTHER): Payer: Self-pay

## 2023-05-03 ENCOUNTER — Inpatient Hospital Stay (HOSPITAL_BASED_OUTPATIENT_CLINIC_OR_DEPARTMENT_OTHER)
Admission: EM | Admit: 2023-05-03 | Discharge: 2023-05-08 | DRG: 378 | Disposition: A | Discharge status: Home / Self Care | Attending: Internal Medicine | Admitting: Internal Medicine

## 2023-05-03 ENCOUNTER — Emergency Department (HOSPITAL_BASED_OUTPATIENT_CLINIC_OR_DEPARTMENT_OTHER)

## 2023-05-03 DIAGNOSIS — Z811 Family history of alcohol abuse and dependence: Secondary | ICD-10-CM

## 2023-05-03 DIAGNOSIS — Z79899 Other long term (current) drug therapy: Secondary | ICD-10-CM | POA: Diagnosis not present

## 2023-05-03 DIAGNOSIS — Z66 Do not resuscitate: Secondary | ICD-10-CM | POA: Diagnosis present

## 2023-05-03 DIAGNOSIS — Z8701 Personal history of pneumonia (recurrent): Secondary | ICD-10-CM

## 2023-05-03 DIAGNOSIS — K5730 Diverticulosis of large intestine without perforation or abscess without bleeding: Secondary | ICD-10-CM | POA: Diagnosis present

## 2023-05-03 DIAGNOSIS — K219 Gastro-esophageal reflux disease without esophagitis: Secondary | ICD-10-CM | POA: Diagnosis present

## 2023-05-03 DIAGNOSIS — E039 Hypothyroidism, unspecified: Secondary | ICD-10-CM | POA: Diagnosis present

## 2023-05-03 DIAGNOSIS — Z8041 Family history of malignant neoplasm of ovary: Secondary | ICD-10-CM

## 2023-05-03 DIAGNOSIS — I272 Pulmonary hypertension, unspecified: Secondary | ICD-10-CM | POA: Diagnosis present

## 2023-05-03 DIAGNOSIS — E1122 Type 2 diabetes mellitus with diabetic chronic kidney disease: Secondary | ICD-10-CM | POA: Diagnosis present

## 2023-05-03 DIAGNOSIS — Z79890 Hormone replacement therapy (postmenopausal): Secondary | ICD-10-CM

## 2023-05-03 DIAGNOSIS — D62 Acute posthemorrhagic anemia: Secondary | ICD-10-CM | POA: Diagnosis present

## 2023-05-03 DIAGNOSIS — N179 Acute kidney failure, unspecified: Secondary | ICD-10-CM | POA: Diagnosis present

## 2023-05-03 DIAGNOSIS — K2970 Gastritis, unspecified, without bleeding: Secondary | ICD-10-CM | POA: Diagnosis not present

## 2023-05-03 DIAGNOSIS — E1140 Type 2 diabetes mellitus with diabetic neuropathy, unspecified: Secondary | ICD-10-CM | POA: Diagnosis present

## 2023-05-03 DIAGNOSIS — I358 Other nonrheumatic aortic valve disorders: Secondary | ICD-10-CM | POA: Diagnosis present

## 2023-05-03 DIAGNOSIS — D649 Anemia, unspecified: Secondary | ICD-10-CM

## 2023-05-03 DIAGNOSIS — K648 Other hemorrhoids: Secondary | ICD-10-CM | POA: Diagnosis not present

## 2023-05-03 DIAGNOSIS — K922 Gastrointestinal hemorrhage, unspecified: Principal | ICD-10-CM

## 2023-05-03 DIAGNOSIS — I5032 Chronic diastolic (congestive) heart failure: Secondary | ICD-10-CM

## 2023-05-03 DIAGNOSIS — I10 Essential (primary) hypertension: Secondary | ICD-10-CM | POA: Diagnosis not present

## 2023-05-03 DIAGNOSIS — G40909 Epilepsy, unspecified, not intractable, without status epilepticus: Secondary | ICD-10-CM

## 2023-05-03 DIAGNOSIS — N184 Chronic kidney disease, stage 4 (severe): Secondary | ICD-10-CM | POA: Diagnosis present

## 2023-05-03 DIAGNOSIS — K2951 Unspecified chronic gastritis with bleeding: Secondary | ICD-10-CM | POA: Diagnosis not present

## 2023-05-03 DIAGNOSIS — Z880 Allergy status to penicillin: Secondary | ICD-10-CM

## 2023-05-03 DIAGNOSIS — G4733 Obstructive sleep apnea (adult) (pediatric): Secondary | ICD-10-CM | POA: Diagnosis present

## 2023-05-03 DIAGNOSIS — Z833 Family history of diabetes mellitus: Secondary | ICD-10-CM

## 2023-05-03 DIAGNOSIS — I130 Hypertensive heart and chronic kidney disease with heart failure and stage 1 through stage 4 chronic kidney disease, or unspecified chronic kidney disease: Secondary | ICD-10-CM | POA: Diagnosis present

## 2023-05-03 DIAGNOSIS — I959 Hypotension, unspecified: Secondary | ICD-10-CM

## 2023-05-03 DIAGNOSIS — K921 Melena: Secondary | ICD-10-CM | POA: Diagnosis not present

## 2023-05-03 DIAGNOSIS — Z8249 Family history of ischemic heart disease and other diseases of the circulatory system: Secondary | ICD-10-CM

## 2023-05-03 DIAGNOSIS — Z85828 Personal history of other malignant neoplasm of skin: Secondary | ICD-10-CM | POA: Diagnosis not present

## 2023-05-03 DIAGNOSIS — K644 Residual hemorrhoidal skin tags: Secondary | ICD-10-CM | POA: Diagnosis not present

## 2023-05-03 DIAGNOSIS — Z7982 Long term (current) use of aspirin: Secondary | ICD-10-CM

## 2023-05-03 DIAGNOSIS — Z87891 Personal history of nicotine dependence: Secondary | ICD-10-CM | POA: Diagnosis not present

## 2023-05-03 DIAGNOSIS — K6389 Other specified diseases of intestine: Secondary | ICD-10-CM | POA: Diagnosis not present

## 2023-05-03 DIAGNOSIS — E7800 Pure hypercholesterolemia, unspecified: Secondary | ICD-10-CM | POA: Diagnosis present

## 2023-05-03 DIAGNOSIS — Z887 Allergy status to serum and vaccine status: Secondary | ICD-10-CM

## 2023-05-03 DIAGNOSIS — I350 Nonrheumatic aortic (valve) stenosis: Secondary | ICD-10-CM

## 2023-05-03 DIAGNOSIS — Z823 Family history of stroke: Secondary | ICD-10-CM

## 2023-05-03 DIAGNOSIS — Z7984 Long term (current) use of oral hypoglycemic drugs: Secondary | ICD-10-CM

## 2023-05-03 DIAGNOSIS — K2950 Unspecified chronic gastritis without bleeding: Secondary | ICD-10-CM | POA: Diagnosis not present

## 2023-05-03 LAB — RESP PANEL BY RT-PCR (RSV, FLU A&B, COVID)  RVPGX2
Influenza A by PCR: NEGATIVE
Influenza B by PCR: NEGATIVE
Resp Syncytial Virus by PCR: NEGATIVE
SARS Coronavirus 2 by RT PCR: NEGATIVE

## 2023-05-03 LAB — CBC WITH DIFFERENTIAL/PLATELET
Abs Immature Granulocytes: 0.07 10*3/uL (ref 0.00–0.07)
Basophils Absolute: 0 10*3/uL (ref 0.0–0.1)
Basophils Relative: 0 %
Eosinophils Absolute: 0.3 10*3/uL (ref 0.0–0.5)
Eosinophils Relative: 5 %
HCT: 18.3 % — ABNORMAL LOW (ref 36.0–46.0)
Hemoglobin: 5.4 g/dL — CL (ref 12.0–15.0)
Immature Granulocytes: 1 %
Lymphocytes Relative: 6 %
Lymphs Abs: 0.3 10*3/uL — ABNORMAL LOW (ref 0.7–4.0)
MCH: 27 pg (ref 26.0–34.0)
MCHC: 29.5 g/dL — ABNORMAL LOW (ref 30.0–36.0)
MCV: 91.5 fL (ref 80.0–100.0)
Monocytes Absolute: 0.3 10*3/uL (ref 0.1–1.0)
Monocytes Relative: 5 %
Neutro Abs: 5 10*3/uL (ref 1.7–7.7)
Neutrophils Relative %: 83 %
Platelets: 329 10*3/uL (ref 150–400)
RBC: 2 MIL/uL — ABNORMAL LOW (ref 3.87–5.11)
RDW: 24.3 % — ABNORMAL HIGH (ref 11.5–15.5)
WBC: 6.1 10*3/uL (ref 4.0–10.5)
nRBC: 0.8 % — ABNORMAL HIGH (ref 0.0–0.2)

## 2023-05-03 LAB — BASIC METABOLIC PANEL
Anion gap: 10 (ref 5–15)
BUN: 100 mg/dL — ABNORMAL HIGH (ref 8–23)
CO2: 21 mmol/L — ABNORMAL LOW (ref 22–32)
Calcium: 9 mg/dL (ref 8.9–10.3)
Chloride: 105 mmol/L (ref 98–111)
Creatinine, Ser: 2.77 mg/dL — ABNORMAL HIGH (ref 0.44–1.00)
GFR, Estimated: 16 mL/min — ABNORMAL LOW (ref >60)
Glucose, Bld: 292 mg/dL — ABNORMAL HIGH (ref 70–99)
Potassium: 3.6 mmol/L (ref 3.5–5.1)
Sodium: 136 mmol/L (ref 135–145)

## 2023-05-03 LAB — TROPONIN I (HIGH SENSITIVITY)
Troponin I (High Sensitivity): 24 ng/L — ABNORMAL HIGH (ref <18)
Troponin I (High Sensitivity): 23 ng/L — ABNORMAL HIGH (ref <18)

## 2023-05-03 LAB — OCCULT BLOOD X 1 CARD TO LAB, STOOL: Fecal Occult Bld: POSITIVE — AB

## 2023-05-03 LAB — CBG MONITORING, ED: Glucose-Capillary: 157 mg/dL — ABNORMAL HIGH (ref 70–99)

## 2023-05-03 LAB — PREPARE RBC (CROSSMATCH)

## 2023-05-03 MED ORDER — SODIUM CHLORIDE 0.9% IV SOLUTION: Freq: Once | INTRAVENOUS | Status: DC

## 2023-05-03 MED ORDER — LACTATED RINGERS IV BOLUS
500.0000 mL | Freq: Once | INTRAVENOUS | Status: AC
  Administered 2023-05-03: 500 mL via INTRAVENOUS

## 2023-05-03 MED ORDER — ROPINIROLE HCL 1 MG PO TABS
0.5000 mg | ORAL_TABLET | Freq: Every day | ORAL | Status: DC
  Administered 2023-05-04 – 2023-05-07 (×5): 0.5 mg via ORAL
  Filled 2023-05-03 (×5): qty 1

## 2023-05-03 MED ORDER — LEVOTHYROXINE SODIUM 112 MCG PO TABS
112.0000 ug | ORAL_TABLET | Freq: Every morning | ORAL | Status: DC
  Administered 2023-05-04 – 2023-05-08 (×4): 112 ug via ORAL
  Filled 2023-05-03 (×4): qty 1

## 2023-05-03 MED ORDER — SODIUM CHLORIDE 0.9 % IV BOLUS
500.0000 mL | Freq: Once | INTRAVENOUS | Status: AC
  Administered 2023-05-03: 500 mL via INTRAVENOUS

## 2023-05-03 MED ORDER — INSULIN ASPART 100 UNIT/ML IJ SOLN
0.0000 [IU] | INTRAMUSCULAR | Status: DC
  Administered 2023-05-04 – 2023-05-06 (×9): 1 [IU] via SUBCUTANEOUS
  Administered 2023-05-06 (×2): 2 [IU] via SUBCUTANEOUS
  Administered 2023-05-06 – 2023-05-08 (×10): 1 [IU] via SUBCUTANEOUS
  Filled 2023-05-03: qty 0.06

## 2023-05-03 MED ORDER — LEVETIRACETAM 500 MG PO TABS
500.0000 mg | ORAL_TABLET | Freq: Two times a day (BID) | ORAL | Status: DC
  Administered 2023-05-04 – 2023-05-08 (×10): 500 mg via ORAL
  Filled 2023-05-03 (×10): qty 1

## 2023-05-03 MED ORDER — SODIUM CHLORIDE 0.9% FLUSH
3.0000 mL | Freq: Two times a day (BID) | INTRAVENOUS | Status: DC
  Administered 2023-05-04 – 2023-05-08 (×10): 3 mL via INTRAVENOUS

## 2023-05-03 MED ORDER — PANTOPRAZOLE SODIUM 40 MG IV SOLR
40.0000 mg | Freq: Two times a day (BID) | INTRAVENOUS | Status: DC
  Administered 2023-05-04 – 2023-05-05 (×5): 40 mg via INTRAVENOUS
  Filled 2023-05-03 (×5): qty 10

## 2023-05-03 MED ORDER — ACETAMINOPHEN 500 MG PO TABS
1000.0000 mg | ORAL_TABLET | Freq: Once | ORAL | Status: AC
Start: 2023-05-03 — End: 2023-05-03
  Administered 2023-05-03: 1000 mg via ORAL
  Filled 2023-05-03: qty 2

## 2023-05-03 MED ORDER — CHLORHEXIDINE GLUCONATE CLOTH 2 % EX PADS
6.0000 | MEDICATED_PAD | Freq: Every day | CUTANEOUS | Status: DC
Start: 2023-05-04 — End: 2023-05-08
  Administered 2023-05-04 – 2023-05-07 (×3): 6 via TOPICAL

## 2023-05-03 MED ORDER — SODIUM CHLORIDE 0.9% IV SOLUTION: Freq: Once | INTRAVENOUS | Status: AC

## 2023-05-03 NOTE — ED Provider Notes (Signed)
 [facility]ENC[facility] DEPARTMENT AT [facility] Provider Note   CSN: [id number] Arrival date & time: 05/03/23  1226     History  Chief Complaint  Patient presents with   Melena    [name] [name] is a 89 y.o. female with a history of diastolic CHF, severe AS, pulmonary hypertension, iron deficiency anemia/chronic on iron infusions, CKD stage IV baseline creatinine 2.2, hypothyroidism, seizures, presenting to the ED with shortness of breath, low blood pressure, weakness and dark stools.  Patient reports feeling extremely fatigued for the past several days.  She says she has had no energy to get up and is wanting to sleep all day.  She reports she noted dark stools last night.  She does take iron for iron deficiency anemia.  She also reports that the home nurse noticed her blood pressure was "too low to register" this morning.  Patient reports he has not been drinking much the past few days because she has no appetite and no energy.  Most recent echocardiogram in December 2024, 4 months ago, showing an EF of 70%, grade 2 diastolic dysfunction, severe calcification of the aortic valve with severe thickening.  In 2002 into the patient had an upper endoscopy with [facility] was unremarkable and a colonoscopy that showed diverticulosis and some polyps removed  HPI     Home Medications Prior to Admission medications   Medication Sig Start Date End Date Taking? Authorizing Provider  acetaminophen (TYLENOL) 500 MG tablet Take 1,000 mg by mouth every 6 (six) hours as needed for moderate pain.   Yes [provider]  allopurinol (ZYLOPRIM) 100 MG tablet Take 1 tablet (100 mg total) by mouth 2 (two) times daily. Take  1 tablet  Daily  to prevent Gout. Patient taking differently: Take 100 mg by mouth 2 (two) times daily. 02/07/21  Yes Raynelle Dick, NP  amLODipine (NORVASC) 5 MG tablet Take 5 mg by mouth daily.   Yes [provider]  aspirin 81 MG tablet  Take 81 mg by mouth in the morning.   Yes [provider]  cholecalciferol (VITAMIN D3) 25 MCG (1000 UNIT) tablet Take 3,000 Units by mouth every evening.   Yes [provider]  ferrous sulfate 325 (65 FE) MG tablet Take 1 tablet (325 mg total) by mouth every other day. 02/19/23  Yes Danford, Earl Lites, MD  fish oil-omega-3 fatty acids 1000 MG capsule Take 1,000 mg by mouth at bedtime.   Yes [provider]  furosemide (LASIX) 80 MG tablet Take 1 tablet (80 mg total) by mouth daily. 02/18/23  Yes Danford, Earl Lites, MD  JANUVIA 50 MG tablet Take 50 mg by mouth daily. 12/12/22  Yes [provider]  lansoprazole (PREVACID) 30 MG capsule TAKE 1 CAPSULE BY MOUTH DAILY TO PREVENT HEARTBURN AND INDIGESTION. Patient taking differently: Take 30 mg by mouth daily in the afternoon. 12/19/20  Yes Raynelle Dick, NP  levETIRAcetam (KEPPRA) 500 MG tablet Take  1 tablet 2  x /day  with Meals  to Prevent Seizures Patient taking differently: Take 500 mg by mouth 2 (two) times daily. 07/10/21  Yes Lucky Cowboy, MD  levothyroxine (SYNTHROID) 112 MCG tablet Take 112 mcg by mouth in the morning. 12/12/22  Yes [provider]  loratadine (CLARITIN) 10 MG tablet Take 10 mg by mouth in the morning.   Yes [provider]  meclizine (ANTIVERT) 25 MG tablet Take   1 tablet 2 to 3 x /day  ONLY  if needed for Dizziness /Vertigo Patient taking differently: Take 25 mg by mouth every 8 (eight) hours as needed (vertigo). 07/10/21  Yes Lucky Cowboy, MD  melatonin 3 MG TABS tablet Take 3 mg by mouth at bedtime.   Yes [provider]  metoprolol succinate (TOPROL-XL) 25 MG 24 hr tablet Take 25 mg by mouth daily.   Yes [provider]  ondansetron (ZOFRAN) 4 MG tablet Take 4 mg by mouth every 6 (six) hours as needed for nausea or vomiting.   Yes [provider]  rOPINIRole (REQUIP) 1 MG tablet TAKE ONE-HALF TO ONE TABLET BY MOUTH THREE TIMES  DAILY AS NEEDED FOR RESTLESS LEGS& CRAMPS Patient taking differently: Take 0.5 mg by mouth at bedtime. 12/03/21  Yes Raynelle Dick, NP  vitamin B-12 (CYANOCOBALAMIN) 100 MCG tablet Take 100 mcg by mouth in the morning.   Yes [provider]  vitamin C (ASCORBIC ACID) 500 MG tablet Take 500 mg by mouth in the morning.   Yes [provider]      Allergies    Ppd [tuberculin purified protein derivative] and Penicillins    Review of Systems   Review of Systems  Physical Exam Updated Vital Signs BP (!) 95/30   Pulse 85   Temp 98 F (36.7 C)   Resp 16   Ht 5\' 3"  (1.6 m)   Wt 67.8 kg   SpO2 97%   BMI 26.47 kg/m  Physical Exam Constitutional:      General: She is not in acute distress. HENT:     Head: Normocephalic and atraumatic.  Eyes:     Pupils: Pupils are equal, round, and reactive to light.     Comments: Conjunctival pallor  Cardiovascular:     Rate and Rhythm: Normal rate and regular rhythm.     Heart sounds: Murmur heard.  Pulmonary:     Effort: Pulmonary effort is normal. No respiratory distress.  Abdominal:     General: There is no distension.     Tenderness: There is no abdominal tenderness.  Genitourinary:    Comments: Melena on rectal exam, no hemorrhage Skin:    General: Skin is warm and dry.  Neurological:     General: No focal deficit present.     Mental Status: She is alert. Mental status is at baseline.  Psychiatric:        Mood and Affect: Mood normal.        Behavior: Behavior normal.     ED Results / Procedures / Treatments   Labs (all labs ordered are listed, but only abnormal results are displayed) Labs Reviewed  BASIC METABOLIC PANEL - Abnormal; Notable for the following components:      Result Value   CO2 21 (*)    Glucose, Bld 292 (*)    BUN 100 (*)    Creatinine, Ser 2.77 (*)    GFR, Estimated 16 (*)    All other components within normal limits  CBC WITH DIFFERENTIAL/PLATELET - Abnormal; Notable for the  following components:   RBC 2.00 (*)    Hemoglobin 5.4 (*)    HCT 18.3 (*)    MCHC 29.5 (*)    RDW 24.3 (*)    nRBC 0.8 (*)    Lymphs Abs 0.3 (*)    All other components within normal limits  OCCULT BLOOD X 1 CARD TO LAB, STOOL - Abnormal; Notable for the following components:   Fecal Occult Bld POSITIVE (*)    All other components  within normal limits  TROPONIN I (HIGH SENSITIVITY) - Abnormal; Notable for the following components:   Troponin I (High Sensitivity) 24 (*)    All other components within normal limits  RESP PANEL BY RT-PCR (RSV, FLU A&B, COVID)  RVPGX2  POC OCCULT BLOOD, ED  TROPONIN I (HIGH SENSITIVITY)    EKG EKG Interpretation Date/Time:  Saturday May 03 2023 13:19:34 EDT Ventricular Rate:  85 PR Interval:  181 QRS Duration:  166 QT Interval:  426 QTC Calculation: 507 R Axis:   207  Text Interpretation: Sinus rhythm Right bundle branch block Confirmed by Alvester Chou [phone]) on 05/03/2023 1:40:12 PM  Radiology DG Chest Portable 1 View Result Date: 05/03/2023 CLINICAL DATA:  Shortness of breath, weakness, hypotension. EXAM: PORTABLE CHEST 1 VIEW COMPARISON:  02/11/2023 FINDINGS: Cardiomegaly. Chronic aortic atherosclerotic calcification. The right lung is clear. Mild increased density at the left lung base, improved since the study of December. This could represent residual scarring, but a degree of ongoing volume loss or left base pneumonia is not excluded. No evidence of heart failure or edema. No acute bone finding. IMPRESSION: 1. Cardiomegaly. Chronic aortic atherosclerotic calcification. 2. Mild increased density at the left lung base, improved since the study of December. This could represent residual scarring, but a degree of ongoing volume loss or left base pneumonia is not excluded. Electronically Signed   By: Paulina Fusi M.D.   On: 05/03/2023 13:25    Procedures Procedures    Medications Ordered in ED Medications  sodium chloride 0.9 % bolus  500 mL (0 mLs Intravenous Stopped 05/03/23 1447)    ED Course/ Medical Decision Making/ A&P Clinical Course as of 05/03/23 1507  Sat May 03, 2023  1258 BP 104/64 mmhg at this time [MT]  1423 BP 98/57 (Map 71) at this time [MT]  1445 Dr Denese Killings Critical care spoke by phone - agreed with MAP improving no emergent indication for ICU admission, but if patient becomes unstable, can consult ICU again for admission.  I am awaiting calback from hospitalist via carelink now for stepdown bed admission [MT]    Clinical Course User Index [MT] Jerrico Covello, Kermit Balo, MD                                 Medical Decision Making Amount and/or Complexity of Data Reviewed Labs: ordered. Radiology: ordered. ECG/medicine tests: ordered.  Risk Decision regarding hospitalization.   This patient presents to the ED with concern for fatigue, dyspnea, dark stool. This involves an extensive number of treatment options, and is a complaint that carries with it a high risk of complications and morbidity.  The differential diagnosis includes symptomatic anemia may be related to GI bleed versus symptomatic aortic stenosis versus dehydration versus metabolic derangement or infection versus other  Co-morbidities that complicate the patient evaluation: History of GI bleed, anemia, high risk of recurrence  External records from outside source obtained and reviewed including most recent echocardiogram and cardiology outpatient records  I ordered and personally interpreted labs.  The pertinent results include: Hemoglobin 5.4 (from 7.6 two months ago); hemoccult positive; glucose 292, Cr 2.77; covid flu negative  I ordered imaging studies including x-ray of the chest I independently visualized and interpreted imaging which showed no emergent findings/question of thickening of the left lower lobe but no clear clinical evidence of pneumonia I agree with the radiologist interpretation  The patient was maintained on a cardiac  monitor.  I personally viewed and interpreted the cardiac monitored which showed an underlying rhythm of: Sinus rhythm  Per my interpretation the patient's ECG shows no acute ischemic findings  I ordered medication including IV fluid bolus for hypotension  I have reviewed the patients home medicines and have made adjustments as needed  Test Considered: No emergent indication for CT imaging of the abdomen  I requested consultation with the [facility],  and discussed lab and imaging findings as well as pertinent plan - they recommend:   After the interventions noted above, I reevaluated the patient and found that they have: stayed the same   Dispostion:  We will plan for ED to ED transfer to [facility] (Dr Jeraldine Loots accepting) and will ask ED team to reassess patient's blood pressure upon arrival, and to initiate blood transfusion.  If patient remains hypotensive, ICU ([facility]) is aware of her and can be consulted for admission.  Otherwise, the [facility] is also aware of the patient and can accept her as a stepdown admission.  Carelink contacted now.  Additional 500 cc fluid bolus ordered for labile and soft blood pressure - patient still mentating well, we'll track her BP has MAP did improve earlier.  I don't think we need to start vasopressors at this time or release uncrossed emergency blood, but if she deteriorates we can consider that option.  Dr Glyn Ade is assuming supervision of patient now at 310 pm until transfer to [facility] ED.         Final Clinical Impression(s) / ED Diagnoses Final diagnoses:  Gastrointestinal hemorrhage, unspecified gastrointestinal hemorrhage type  Symptomatic anemia  Hypotension, unspecified hypotension type    Rx / DC Orders ED Discharge Orders     None         Terald Sleeper, MD 05/03/23 [phone]

## 2023-05-03 NOTE — H&P (Signed)
 History and Physical    KEYMORA GRILLOT WUJ:[id number] DOB: 06/11/1932 DOA: 05/03/2023  PCP: Eloisa Northern, MD   Patient coming from: Home   Chief Complaint: fatigue, dark stool   HPI: [name] [name] is a 89 y.o. female with medical history significant for hypertension, type 2 diabetes mellitus, chronic HFpEF, aortic stenosis, pulmonary hypertension, CKD stage IV, and chronic normocytic anemia with baseline hemoglobin in the 7-8 range, now presenting with fatigue and dark stool.  Patient reports mild intermittent upper abdominal discomfort over the past 2 weeks, nausea with eating, and progressive fatigue.  Noted her stool to be dark yesterday.  This morning, the patient's blood pressure to be low.  She denies NSAID use.  ED Course: Upon arrival to the ED, patient is found to be afebrile and saturating 100% on 2 L/min supplemental oxygen with normal heart rate and systolic blood pressure in the upper 80s and 90s.  Labs are most notable for hemoglobin 5.4, BUN 100, creatinine 2.77, and glucose 292.  Gastroenterology was consulted by ED physician and the patient was treated with a liter of normal saline and acetaminophen.  2 units of packed red blood cells have been ordered for transfusion.  Review of Systems:  All other systems reviewed and apart from HPI, are negative.  Past Medical History:  Diagnosis Date   Anemia, unspecified    Arthritis    "knees; right shoulder" (09/15/2013)   Basal cell carcinoma    "right upper outer lip"   DM neuropathy, type II diabetes mellitus (HCC)    Dyspnea 2021   with low iron   GERD (gastroesophageal reflux disease)    Gout    HCAP (healthcare-associated pneumonia) 01/03/2022   High cholesterol    Hypertension    Hypothyroidism    Meniere's disease    Obesity (BMI 30-39.9)    Other forms of epilepsy and recurrent seizures without mention of intractable epilepsy 11/04/2012   Non convulsive paroxysmal spells, responding to [facility], patient not  driving.    Pneumonia ~ 1943   Severe sepsis (HCC) 01/03/2022   Skin cancer    "forehead; right hand"   Stage 4 chronic kidney disease (HCC)    UTI (urinary tract infection)    Vitamin D deficiency     Past Surgical History:  Procedure Laterality Date   BIOPSY  04/05/2020   Procedure: BIOPSY;  Surgeon: Beverley Fiedler, MD;  Location: [facility];  Service: Gastroenterology;;   CERVICAL POLYPECTOMY     COLONOSCOPY WITH PROPOFOL N/A 04/06/2020   Procedure: COLONOSCOPY WITH PROPOFOL;  Surgeon: Beverley Fiedler, MD;  Location: [facility];  Service: Gastroenterology;  Laterality: N/A;   ESOPHAGOGASTRODUODENOSCOPY (EGD) WITH PROPOFOL N/A 04/05/2020   Procedure: ESOPHAGOGASTRODUODENOSCOPY (EGD) WITH PROPOFOL;  Surgeon: Beverley Fiedler, MD;  Location: [facility];  Service: Gastroenterology;  Laterality: N/A;   HAMMER TOE SURGERY Left 1980's   IR FLUORO GUIDE CV LINE RIGHT  01/09/2022   IR PERC TUN PERIT CATH WO PORT S&I /IMAG  01/09/2022   IR REMOVAL TUN CV CATH W/O FL  02/15/2022   IR US GUIDE VASC ACCESS RIGHT  01/09/2022   MOHS SURGERY  20087   "right upper outer lip"   POLYPECTOMY  04/06/2020   Procedure: POLYPECTOMY;  Surgeon: Beverley Fiedler, MD;  Location: [facility];  Service: Gastroenterology;;   TONSILLECTOMY  ~ 1947   WRIST SURGERY Left ~ 1950   "ran arm thru window"    Social History:   reports that she quit  smoking about 39 years ago. Her smoking use included cigarettes. She started smoking about 69 years ago. She has a 30 pack-year smoking history. She has never used smokeless tobacco. She reports that she does not currently use alcohol. She reports that she does not use drugs.  Allergies  Allergen Reactions   Ppd [Tuberculin Purified Protein Derivative] Other (See Comments)    indurated   Penicillins Other (See Comments)    Unknown     Family History  Problem Relation Age of Onset   Heart disease Mother    Stroke Mother    Heart disease Father    Ovarian cancer  Sister    Hypertension Son    Hypertension Son    Diabetes Son    Alcohol abuse Son      Prior to Admission medications   Medication Sig Start Date End Date Taking? Authorizing Provider  acetaminophen (TYLENOL) 500 MG tablet Take 1,000 mg by mouth every 6 (six) hours as needed for moderate pain.   Yes [provider]  allopurinol (ZYLOPRIM) 100 MG tablet Take 1 tablet (100 mg total) by mouth 2 (two) times daily. Take  1 tablet  Daily  to prevent Gout. Patient taking differently: Take 100 mg by mouth 2 (two) times daily. 02/07/21  Yes Raynelle Dick, NP  amLODipine (NORVASC) 5 MG tablet Take 5 mg by mouth daily.   Yes [provider]  aspirin 81 MG tablet Take 81 mg by mouth in the morning.   Yes [provider]  cholecalciferol (VITAMIN D3) 25 MCG (1000 UNIT) tablet Take 3,000 Units by mouth every evening.   Yes [provider]  ferrous sulfate 325 (65 FE) MG tablet Take 1 tablet (325 mg total) by mouth every other day. 02/19/23  Yes Danford, Earl Lites, MD  fish oil-omega-3 fatty acids 1000 MG capsule Take 1,000 mg by mouth at bedtime.   Yes [provider]  furosemide (LASIX) 80 MG tablet Take 1 tablet (80 mg total) by mouth daily. 02/18/23  Yes Danford, Earl Lites, MD  JANUVIA 50 MG tablet Take 50 mg by mouth daily. 12/12/22  Yes [provider]  lansoprazole (PREVACID) 30 MG capsule TAKE 1 CAPSULE BY MOUTH DAILY TO PREVENT HEARTBURN AND INDIGESTION. Patient taking differently: Take 30 mg by mouth daily in the afternoon. 12/19/20  Yes Raynelle Dick, NP  levETIRAcetam (KEPPRA) 500 MG tablet Take  1 tablet 2  x /day  with Meals  to Prevent Seizures Patient taking differently: Take 500 mg by mouth 2 (two) times daily. 07/10/21  Yes Lucky Cowboy, MD  levothyroxine (SYNTHROID) 112 MCG tablet Take 112 mcg by mouth in the morning. 12/12/22  Yes [provider]  loratadine (CLARITIN) 10 MG tablet Take 10 mg by mouth in  the morning.   Yes [provider]  meclizine (ANTIVERT) 25 MG tablet Take   1 tablet 2 to 3 x /day  ONLY  if needed for Dizziness /Vertigo Patient taking differently: Take 25 mg by mouth every 8 (eight) hours as needed (vertigo). 07/10/21  Yes Lucky Cowboy, MD  melatonin 3 MG TABS tablet Take 3 mg by mouth at bedtime.   Yes [provider]  metoprolol succinate (TOPROL-XL) 25 MG 24 hr tablet Take 25 mg by mouth daily.   Yes [provider]  ondansetron (ZOFRAN) 4 MG tablet Take 4 mg by mouth every 6 (six) hours as needed for nausea or vomiting.   Yes [provider]  rOPINIRole (  REQUIP) 1 MG tablet TAKE ONE-HALF TO ONE TABLET BY MOUTH THREE TIMES DAILY AS NEEDED FOR RESTLESS LEGS& CRAMPS Patient taking differently: Take 0.5 mg by mouth at bedtime. 12/03/21  Yes Raynelle Dick, NP  vitamin B-12 (CYANOCOBALAMIN) 100 MCG tablet Take 100 mcg by mouth in the morning.   Yes [provider]  vitamin C (ASCORBIC ACID) 500 MG tablet Take 500 mg by mouth in the morning.   Yes [provider]    Physical Exam: Vitals:   05/03/23 1802 05/03/23 1820 05/03/23 1900 05/03/23 2000  BP: (!) 95/41 (!) 90/35 (!) 94/40 (!) 99/41  Pulse: 83 81 80 81  Resp: 19 15 14 13   Temp: 97.8 F (36.6 C) (!) 97.5 F (36.4 C)    TempSrc: Oral Oral    SpO2:  99% 98% 98%  Weight:      Height:         Constitutional: NAD, pale  Eyes: PERTLA, lids and conjunctivae normal ENMT: Mucous membranes are moist. Posterior pharynx clear of any exudate or lesions.   Neck: supple, no masses  Respiratory: no wheezing, no crackles. No accessory muscle use.  Cardiovascular: S1 & S2 heard, regular rate and rhythm. No extremity edema.  Abdomen: Soft, no tenderness.  Bowel sounds active.  Musculoskeletal: no clubbing / cyanosis. No joint deformity upper and lower extremities.   Skin: no significant rashes, lesions, ulcers. Warm, dry, well-perfused. Neurologic: CN 2-12 grossly  intact aside for hearing deficit. Moving all extremities. Alert and oriented.  Psychiatric: Calm. Cooperative.    Labs and Imaging on Admission: I have personally reviewed following labs and imaging studies  CBC: Recent Labs  Lab 05/03/23 1259  WBC 6.1  NEUTROABS 5.0  HGB 5.4*  HCT 18.3*  MCV 91.5  PLT 329   Basic Metabolic Panel: Recent Labs  Lab 05/03/23 1259  NA 136  K 3.6  CL 105  CO2 21*  GLUCOSE 292*  BUN 100*  CREATININE 2.77*  CALCIUM 9.0   GFR: Estimated Creatinine Clearance: 13 mL/min (A) (by C-G formula based on SCr of 2.77 mg/dL (H)). Liver Function Tests: No results for input(s): "AST", "ALT", "ALKPHOS", "BILITOT", "PROT", "ALBUMIN" in the last 168 hours. No results for input(s): "LIPASE", "AMYLASE" in the last 168 hours. No results for input(s): "AMMONIA" in the last 168 hours. Coagulation Profile: No results for input(s): "INR", "PROTIME" in the last 168 hours. Cardiac Enzymes: No results for input(s): "CKTOTAL", "CKMB", "CKMBINDEX", "TROPONINI" in the last 168 hours. BNP (last 3 results) No results for input(s): "PROBNP" in the last 8760 hours. HbA1C: No results for input(s): "HGBA1C" in the last 72 hours. CBG: No results for input(s): "GLUCAP" in the last 168 hours. Lipid Profile: No results for input(s): "CHOL", "HDL", "LDLCALC", "TRIG", "CHOLHDL", "LDLDIRECT" in the last 72 hours. Thyroid Function Tests: No results for input(s): "TSH", "T4TOTAL", "FREET4", "T3FREE", "THYROIDAB" in the last 72 hours. Anemia Panel: No results for input(s): "VITAMINB12", "FOLATE", "FERRITIN", "TIBC", "IRON", "RETICCTPCT" in the last 72 hours. Urine analysis:    Component Value Date/Time   COLORURINE STRAW (A) 02/11/2023 0947   APPEARANCEUR CLEAR 02/11/2023 0947   LABSPEC 1.003 (L) 02/11/2023 0947   PHURINE 5.0 02/11/2023 0947   GLUCOSEU >=500 (A) 02/11/2023 0947   HGBUR NEGATIVE 02/11/2023 0947   BILIRUBINUR NEGATIVE 02/11/2023 0947   KETONESUR NEGATIVE  02/11/2023 0947   PROTEINUR NEGATIVE 02/11/2023 0947   UROBILINOGEN 0.2 06/06/2014 0940   NITRITE NEGATIVE 02/11/2023 0947   LEUKOCYTESUR SMALL (A) 02/11/2023 0947  Sepsis Labs: @LABRCNTIP (procalcitonin:4,lacticidven:4) ) Recent Results (from the past 240 hours)  Resp panel by RT-PCR (RSV, Flu A&B, Covid) Anterior Nasal Swab     Status: None   Collection Time: 05/03/23  1:00 PM   Specimen: Anterior Nasal Swab  Result Value Ref Range Status   SARS Coronavirus 2 by RT PCR NEGATIVE NEGATIVE Final    Comment: (NOTE) SARS-CoV-2 target nucleic acids are NOT DETECTED.  The SARS-CoV-2 RNA is generally detectable in upper respiratory specimens during the acute phase of infection. The lowest concentration of SARS-CoV-2 viral copies this assay can detect is 138 copies/mL. A negative result does not preclude SARS-Cov-2 infection and should not be used as the sole basis for treatment or other patient management decisions. A negative result may occur with  improper specimen collection/handling, submission of specimen other than nasopharyngeal swab, presence of viral mutation(s) within the areas targeted by this assay, and inadequate number of viral copies(<138 copies/mL). A negative result must be combined with clinical observations, patient history, and epidemiological information. The expected result is Negative.  Fact Sheet for Patients:  BloggerCourse.com  Fact Sheet for Healthcare Providers:  SeriousBroker.it  This test is no t yet approved or cleared by the [place] FDA and  has been authorized for detection and/or diagnosis of SARS-CoV-2 by FDA under an Emergency Use Authorization (EUA). This EUA will remain  in effect (meaning this test can be used) for the duration of the COVID-19 declaration under Section 564(b)(1) of the Act, 21 U.S.C.section 360bbb-3(b)(1), unless the authorization is terminated  or revoked sooner.        Influenza A by PCR NEGATIVE NEGATIVE Final   Influenza B by PCR NEGATIVE NEGATIVE Final    Comment: (NOTE) The Xpert Xpress SARS-CoV-2/FLU/RSV plus assay is intended as an aid in the diagnosis of influenza from Nasopharyngeal swab specimens and should not be used as a sole basis for treatment. Nasal washings and aspirates are unacceptable for Xpert Xpress SARS-CoV-2/FLU/RSV testing.  Fact Sheet for Patients: BloggerCourse.com  Fact Sheet for Healthcare Providers: SeriousBroker.it  This test is not yet approved or cleared by the [place] FDA and has been authorized for detection and/or diagnosis of SARS-CoV-2 by FDA under an Emergency Use Authorization (EUA). This EUA will remain in effect (meaning this test can be used) for the duration of the COVID-19 declaration under Section 564(b)(1) of the Act, 21 U.S.C. section 360bbb-3(b)(1), unless the authorization is terminated or revoked.     Resp Syncytial Virus by PCR NEGATIVE NEGATIVE Final    Comment: (NOTE) Fact Sheet for Patients: BloggerCourse.com  Fact Sheet for Healthcare Providers: SeriousBroker.it  This test is not yet approved or cleared by the [place] FDA and has been authorized for detection and/or diagnosis of SARS-CoV-2 by FDA under an Emergency Use Authorization (EUA). This EUA will remain in effect (meaning this test can be used) for the duration of the COVID-19 declaration under Section 564(b)(1) of the Act, 21 U.S.C. section 360bbb-3(b)(1), unless the authorization is terminated or revoked.  Performed at [facility], [address] [place], [place] [address]      Radiological Exams on Admission: DG Chest Portable 1 View Result Date: 05/03/2023 CLINICAL DATA:  Shortness of breath, weakness, hypotension. EXAM: PORTABLE CHEST 1 VIEW COMPARISON:  02/11/2023 FINDINGS:  Cardiomegaly. Chronic aortic atherosclerotic calcification. The right lung is clear. Mild increased density at the left lung base, improved since the study of December. This could represent residual scarring, but a degree of ongoing volume loss  or left base pneumonia is not excluded. No evidence of heart failure or edema. No acute bone finding. IMPRESSION: 1. Cardiomegaly. Chronic aortic atherosclerotic calcification. 2. Mild increased density at the left lung base, improved since the study of December. This could represent residual scarring, but a degree of ongoing volume loss or left base pneumonia is not excluded. Electronically Signed   By: Paulina Fusi M.D.   On: 05/03/2023 13:25    EKG: Independently reviewed. Sinus rhythm, RBBB.   Assessment/Plan   1. GI bleeding; symptomatic anemia  - Continue NPO, start IV PPI, transfuse 2 units RBC, follow serial H&H, transfuse additional RBC if needed    2. AKI superimposed on CKD IV  - Likely prerenal in setting of blood-loss, recent loss of appetite, and hypotension  - Hold antihypertensives, transfuse as planned, give additional fluid boluses or start vasopressor if needed for hypotension, renally-dose medications, repeat chem panel in am   3. Type II DM  - A1c was 7.2% in March 2024  - Check CBGs and use low-intensity SSI for now    4. Chronic HFpEF; aortic stenosis; pulmonary HTN  - Appears compensated  - Hold diuretics for now, follow daily weight and I/Os    5. Seizures  - Keppra   6. Hypothyroidism  - Synthroid   7. OSA  - CPAP while sleeping    DVT prophylaxis: SCDs  Code Status: DNR  Level of Care: Level of care: Stepdown Family Communication: None present   Disposition Plan:  Patient is from: home  Anticipated d/c is to: TBD Anticipated d/c date is: 05/06/23  Patient currently: Pending stable H&H Consults called: GI  Admission status: inpatient     Briscoe Deutscher, MD [facility]  05/03/2023, 9:03 PM

## 2023-05-03 NOTE — Progress Notes (Signed)
   05/03/23 2215  BiPAP/CPAP/SIPAP  BiPAP/CPAP/SIPAP Pt Type Adult  Reason BIPAP/CPAP not in use Non-compliant

## 2023-05-03 NOTE — ED Triage Notes (Signed)
 In for eval of low BP, SOB, weakness, and a black stool yesterday. No BM today.

## 2023-05-03 NOTE — Consult Note (Signed)
 Consultation  Referring Provider:      Primary Care Physician:  Eloisa Northern, MD Primary Gastroenterologist:      Dr. Yancey Flemings   Reason for Consultation:     Melena, acute on chronic anemia         HPI:   [name] [name] is a 89 y.o. female with a past medical history noteworthy for severe aortic stenosis, severe pulmonary hypertension, seizure disorder, OSA on CPAP, CKD, HTN, HLD, T2DM, arthritis, Meniere's disease who is seen in consultation for acute on chronic anemia and melena.  In speaking  with [name] [name] as well as reviewing her prior medical records she reports progressive weakness over the last 2 to 3 weeks.  States that her energy was diminished.  She had been participating in physical therapy and noted that her endurance had waned.  She began noticing black stools on 05/01/2023.  No bright red blood.  Endorses mild right-sided abdominal discomfort and nausea without vomiting.  Denies having a significant history of GI bleeding.  No NSAID use.  States that the only pain reliever she takes is Tylenol.  Not on anticoagulation.  Reports that for chronic anemia she has received iron infusions.  She was initially transported to the [facility] ED where she was noted to have soft blood pressure 95/30, pulse 85.  Examination noteworthy for melena.  Labs pertinent for hemoglobin 5.4 down from previous baseline of 7.6.  Elevated BUN/ Cr  100/ 2.77 she was subsequently transferred to [facility] for further management.  Last EGD and colonoscopy performed in 2022: EGD-chronic appearing gastritis Colonoscopy - Three 2-6 mm polyps, diverticulosis and internal and external hemorrhoids  Past Medical History:  Diagnosis Date   Anemia, unspecified    Arthritis    "knees; right shoulder" (09/15/2013)   Basal cell carcinoma    "right upper outer lip"   DM neuropathy, type II diabetes mellitus (HCC)    Dyspnea 2021   with low iron   GERD (gastroesophageal reflux disease)    Gout    HCAP  (healthcare-associated pneumonia) 01/03/2022   High cholesterol    Hypertension    Hypothyroidism    Meniere's disease    Obesity (BMI 30-39.9)    Other forms of epilepsy and recurrent seizures without mention of intractable epilepsy 11/04/2012   Non convulsive paroxysmal spells, responding to [facility], patient not driving.    Pneumonia ~ 1943   Severe sepsis (HCC) 01/03/2022   Skin cancer    "forehead; right hand"   Stage 4 chronic kidney disease (HCC)    UTI (urinary tract infection)    Vitamin D deficiency     Past Surgical History:  Procedure Laterality Date   BIOPSY  04/05/2020   Procedure: BIOPSY;  Surgeon: Beverley Fiedler, MD;  [facility]: [facility];  Service: Gastroenterology;;   CERVICAL POLYPECTOMY     COLONOSCOPY WITH PROPOFOL N/A 04/06/2020   Procedure: COLONOSCOPY WITH PROPOFOL;  Surgeon: Beverley Fiedler, MD;  Location: [facility];  Service: Gastroenterology;  Laterality: N/A;   ESOPHAGOGASTRODUODENOSCOPY (EGD) WITH PROPOFOL N/A 04/05/2020   Procedure: ESOPHAGOGASTRODUODENOSCOPY (EGD) WITH PROPOFOL;  Surgeon: Beverley Fiedler, MD;  Location: [facility];  Service: Gastroenterology;  Laterality: N/A;   HAMMER TOE SURGERY Left 1980's   IR FLUORO GUIDE CV LINE RIGHT  01/09/2022   IR PERC TUN PERIT CATH WO PORT S&I /IMAG  01/09/2022   IR REMOVAL TUN CV CATH W/O FL  02/15/2022   IR US GUIDE VASC ACCESS RIGHT  01/09/2022   MOHS SURGERY  20087   "right upper outer lip"   POLYPECTOMY  04/06/2020   Procedure: POLYPECTOMY;  Surgeon: Beverley Fiedler, MD;  Location: [facility];  Service: Gastroenterology;;   TONSILLECTOMY  ~ 1947   WRIST SURGERY Left ~ 1950   "ran arm thru window"    Family History  Problem Relation Age of Onset   Heart disease Mother    Stroke Mother    Heart disease Father    Ovarian cancer Sister    Hypertension Son    Hypertension Son    Diabetes Son    Alcohol abuse Son    Social History   Tobacco Use   Smoking status: Former    Current packs/day:  0.00    Average packs/day: 1 pack/day for 30.0 years (30.0 ttl pk-yrs)    Types: Cigarettes    Start date: 06/28/1953    Quit date: 06/29/1983    Years since quitting: 39.8   Smokeless tobacco: Never  Vaping Use   Vaping status: Never Used  Substance Use Topics   Alcohol use: Not Currently   Drug use: No    Prior to Admission medications   Medication Sig Start Date End Date Taking? Authorizing Provider  acetaminophen (TYLENOL) 500 MG tablet Take 1,000 mg by mouth every 6 (six) hours as needed for moderate pain.   Yes [provider]  allopurinol (ZYLOPRIM) 100 MG tablet Take 1 tablet (100 mg total) by mouth 2 (two) times daily. Take  1 tablet  Daily  to prevent Gout. Patient taking differently: Take 100 mg by mouth 2 (two) times daily. 02/07/21  Yes Raynelle Dick, NP  amLODipine (NORVASC) 5 MG tablet Take 5 mg by mouth daily.   Yes [provider]  aspirin 81 MG tablet Take 81 mg by mouth in the morning.   Yes [provider]  cholecalciferol (VITAMIN D3) 25 MCG (1000 UNIT) tablet Take 3,000 Units by mouth every evening.   Yes [provider]  ferrous sulfate 325 (65 FE) MG tablet Take 1 tablet (325 mg total) by mouth every other day. 02/19/23  Yes Danford, Earl Lites, MD  fish oil-omega-3 fatty acids 1000 MG capsule Take 1,000 mg by mouth at bedtime.   Yes [provider]  furosemide (LASIX) 80 MG tablet Take 1 tablet (80 mg total) by mouth daily. 02/18/23  Yes Danford, Earl Lites, MD  JANUVIA 50 MG tablet Take 50 mg by mouth daily. 12/12/22  Yes [provider]  lansoprazole (PREVACID) 30 MG capsule TAKE 1 CAPSULE BY MOUTH DAILY TO PREVENT HEARTBURN AND INDIGESTION. Patient taking differently: Take 30 mg by mouth daily in the afternoon. 12/19/20  Yes Raynelle Dick, NP  levETIRAcetam (KEPPRA) 500 MG tablet Take  1 tablet 2  x /day  with Meals  to Prevent Seizures Patient taking differently: Take 500 mg by mouth 2 (two) times  daily. 07/10/21  Yes Lucky Cowboy, MD  levothyroxine (SYNTHROID) 112 MCG tablet Take 112 mcg by mouth in the morning. 12/12/22  Yes [provider]  loratadine (CLARITIN) 10 MG tablet Take 10 mg by mouth in the morning.   Yes [provider]  meclizine (ANTIVERT) 25 MG tablet Take   1 tablet 2 to 3 x /day  ONLY  if needed for Dizziness /Vertigo Patient taking differently: Take 25 mg by mouth every 8 (eight) hours as needed (vertigo). 07/10/21  Yes Lucky Cowboy, MD  melatonin 3 MG TABS tablet Take 3  mg by mouth at bedtime.   Yes [provider]  metoprolol succinate (TOPROL-XL) 25 MG 24 hr tablet Take 25 mg by mouth daily.   Yes [provider]  ondansetron (ZOFRAN) 4 MG tablet Take 4 mg by mouth every 6 (six) hours as needed for nausea or vomiting.   Yes [provider]  rOPINIRole (REQUIP) 1 MG tablet TAKE ONE-HALF TO ONE TABLET BY MOUTH THREE TIMES DAILY AS NEEDED FOR RESTLESS LEGS& CRAMPS Patient taking differently: Take 0.5 mg by mouth at bedtime. 12/03/21  Yes Raynelle Dick, NP  vitamin B-12 (CYANOCOBALAMIN) 100 MCG tablet Take 100 mcg by mouth in the morning.   Yes [provider]  vitamin C (ASCORBIC ACID) 500 MG tablet Take 500 mg by mouth in the morning.   Yes [provider]    No current facility-administered medications for this encounter.   Current Outpatient Medications  Medication Sig Dispense Refill   acetaminophen (TYLENOL) 500 MG tablet Take 1,000 mg by mouth every 6 (six) hours as needed for moderate pain.     allopurinol (ZYLOPRIM) 100 MG tablet Take 1 tablet (100 mg total) by mouth 2 (two) times daily. Take  1 tablet  Daily  to prevent Gout. (Patient taking differently: Take 100 mg by mouth 2 (two) times daily.) 180 tablet 2   amLODipine (NORVASC) 5 MG tablet Take 5 mg by mouth daily.     aspirin 81 MG tablet Take 81 mg by mouth in the morning.     cholecalciferol (VITAMIN D3) 25 MCG (1000 UNIT) tablet  Take 3,000 Units by mouth every evening.     ferrous sulfate 325 (65 FE) MG tablet Take 1 tablet (325 mg total) by mouth every other day.     fish oil-omega-3 fatty acids 1000 MG capsule Take 1,000 mg by mouth at bedtime.     furosemide (LASIX) 80 MG tablet Take 1 tablet (80 mg total) by mouth daily. 30 tablet 0   JANUVIA 50 MG tablet Take 50 mg by mouth daily.     lansoprazole (PREVACID) 30 MG capsule TAKE 1 CAPSULE BY MOUTH DAILY TO PREVENT HEARTBURN AND INDIGESTION. (Patient taking differently: Take 30 mg by mouth daily in the afternoon.) 90 capsule 3   levETIRAcetam (KEPPRA) 500 MG tablet Take  1 tablet 2  x /day  with Meals  to Prevent Seizures (Patient taking differently: Take 500 mg by mouth 2 (two) times daily.) 180 tablet 3   levothyroxine (SYNTHROID) 112 MCG tablet Take 112 mcg by mouth in the morning.     loratadine (CLARITIN) 10 MG tablet Take 10 mg by mouth in the morning.     meclizine (ANTIVERT) 25 MG tablet Take   1 tablet 2 to 3 x /day  ONLY  if needed for Dizziness /Vertigo (Patient taking differently: Take 25 mg by mouth every 8 (eight) hours as needed (vertigo).) 90 tablet 1   melatonin 3 MG TABS tablet Take 3 mg by mouth at bedtime.     metoprolol succinate (TOPROL-XL) 25 MG 24 hr tablet Take 25 mg by mouth daily.     ondansetron (ZOFRAN) 4 MG tablet Take 4 mg by mouth every 6 (six) hours as needed for nausea or vomiting.     rOPINIRole (REQUIP) 1 MG tablet TAKE ONE-HALF TO ONE TABLET BY MOUTH THREE TIMES DAILY AS NEEDED FOR RESTLESS LEGS& CRAMPS (Patient taking differently: Take 0.5 mg by mouth at bedtime.) 270 tablet 3   vitamin B-12 (CYANOCOBALAMIN) 100 MCG  tablet Take 100 mcg by mouth in the morning.     vitamin C (ASCORBIC ACID) 500 MG tablet Take 500 mg by mouth in the morning.      Allergies as of 05/03/2023 - Review Complete 05/03/2023  Allergen Reaction Noted   Ppd [tuberculin purified protein derivative] Other (See Comments)    Penicillins Other (See Comments)  02/11/2023     Review of Systems:    Constitutional: No weight loss, fever, chills, + weakness and fatigue HEENT: Eyes: No change in vision               Ears, Nose, Throat:  No change in hearing or congestion Skin: No rash or itching Cardiovascular: No chest pain, chest pressure or palpitations   Respiratory: + SOB, no cough Gastrointestinal: See HPI and otherwise negative Genitourinary: No dysuria or change in urinary frequency Neurological: No headache, dizziness or syncope Musculoskeletal: No new muscle or joint pain Hematologic: No brusing Psychiatric: No history of depression or anxiety     Physical Exam:  Vital signs in last 24 hours: Temp:  [97.5 F (36.4 C)-98 F (36.7 C)] 97.5 F (36.4 C) (03/15 1820) Pulse Rate:  [81-90] 81 (03/15 1820) Resp:  [14-22] 15 (03/15 1820) BP: (88-109)/(30-57) 90/35 (03/15 1820) SpO2:  [89 %-100 %] 99 % (03/15 1820) Weight:  [67.8 kg] 67.8 kg (03/15 1243)   General:   Elderly female resting quietly on hospital gurney Head:  Normocephalic and atraumatic. Eyes:   PEERL, EOMI. No icterus. Conjunctiva pink. Lungs: Respirations even and unlabored. Lungs clear to auscultation bilaterally.   Nowheezes, crackles, or rhonchi.  Heart: Normal S1, S2.  3/6 systolic murmur,  regular rate and rhythm. No peripheral edema, cyanosis or pallor.  Abdomen:  Soft, nondistended, nontender. No rebound or guarding. Normal bowel sounds. No appreciable masses or hepatomegaly. Rectal: External hemorrhoids, dark stool in rectal vault    LAB RESULTS: Recent Labs    05/03/23 1259  WBC 6.1  HGB 5.4*  HCT 18.3*  PLT 329   BMET Recent Labs    05/03/23 1259  NA 136  K 3.6  CL 105  CO2 21*  GLUCOSE 292*  BUN 100*  CREATININE 2.77*  CALCIUM 9.0   LFT No results for input(s): "PROT", "ALBUMIN", "AST", "ALT", "ALKPHOS", "BILITOT", "BILIDIR", "IBILI" in the last 72 hours. PT/INR No results for input(s): "LABPROT", "INR" in the last 72  hours.  STUDIES: DG Chest Portable 1 View Result Date: 05/03/2023 CLINICAL DATA:  Shortness of breath, weakness, hypotension. EXAM: PORTABLE CHEST 1 VIEW COMPARISON:  02/11/2023 FINDINGS: Cardiomegaly. Chronic aortic atherosclerotic calcification. The right lung is clear. Mild increased density at the left lung base, improved since the study of December. This could represent residual scarring, but a degree of ongoing volume loss or left base pneumonia is not excluded. No evidence of heart failure or edema. No acute bone finding. IMPRESSION: 1. Cardiomegaly. Chronic aortic atherosclerotic calcification. 2. Mild increased density at the left lung base, improved since the study of December. This could represent residual scarring, but a degree of ongoing volume loss or left base pneumonia is not excluded. Electronically Signed   By: Paulina Fusi M.D.   On: 05/03/2023 13:25    PREVIOUS ENDOSCOPIES:            EGD 2022  Chronic appearing gastritis; o/w normal Path: mild chronic gastritis with reactive gastropathy, negative for H. Pylori   Colonoscopy 2022  Three 2-6 mm polyps, diverticulosis and internal and external hemorrhoids Path:  TAs   Impression / Plan:  [name] [name] is an 89 year old woman with a past medical history noteworthy for severe aortic stenosis, severe pulmonary hypertension, seizure disorder, OSA on CPAP, CKD, HTN, HLD, T2DM, arthritis, Meniere's disease who is seen in consultation for acute on chronic anemia and melena.    At the time of presentation hemoglobin had declined to 5.4 from 7.6 with soft blood pressures at outside ED that have stabilized.  Labs also show elevated BUN to creatinine ratio suggesting upper GI source of bleeding.  Differential diagnosis may include esophagitis, gastritis, H. Pylori,  peptic ulcer disease, duodenitis, angioectasias in the setting of AS, Dieulafoy lesion.  In the short-term recommend careful resuscitation given her her underlying cardiopulmonary  comorbidities.  Anticipate she will warrant an EGD/enteroscopy this admission for further evaluation of source of bleeding once adequately resuscitated.  Recommendations: Continue PRBC transfusion to obtain hemoglobin close to baseline value in 7-8 range Monitor serial hemoglobin and hematocrit every 8-12 hours Obtain coags-PT, PT T, INR Obtain n.p.o. for now until hemoglobin trend and bleeding trajectory assessed Monitor hemodynamics Start Protonix 40 mg IV twice daily Notify GI team if patient develops brisk GI bleeding or hemodynamic instability.    Thank you for your kind consultation, we will continue to follow.  Durene Romans Damisha Wolff  05/03/2023, 6:58 PM

## 2023-05-03 NOTE — ED Notes (Signed)
 Report given to Pattie, RN, CN at [facility] prior to transport. She left our unit with [facility] at 1420 hours.

## 2023-05-03 NOTE — ED Provider Notes (Signed)
 Care of the patient assumed when she arrived to this facility.  I discussed the patient's presentation to our affiliated facility prior to transfer.  After arrival on my assessment the patient is awake and alert, though she is hypotensive, her systolic is 90, she is mentating appropriately, and she not tachycardic, suggesting chronic loss.  She and I discussed indications for transfusion, she has had type and screen, and will receive transfusion.  Update: I discussed the patient's case with our critical care colleagues, and given the patient's lack of tachycardia, distress, and preserved mental status, patient designated as appropriate for stepdown admission by hospitalist team.  6:51 PM Patient receiving transfusion, vitals similar, MAP 50s, mentation appropriate, no ongoing complaints.  GI had been previously contacted will follow as a consulting service, critical care is aware, patient will be admitted to stepdown, hospitalist team.  CRITICAL CARE Performed by: Gerhard Munch Total critical care time: 35 minutes Critical care time was exclusive of separately billable procedures and treating other patients. Critical care was necessary to treat or prevent imminent or life-threatening deterioration. Critical care was time spent personally by me on the following activities: development of treatment plan with patient and/or surrogate as well as nursing, discussions with consultants, evaluation of patient's response to treatment, examination of patient, obtaining history from patient or surrogate, ordering and performing treatments and interventions, ordering and review of laboratory studies, ordering and review of radiographic studies, pulse oximetry and re-evaluation of patient's condition.    Gerhard Munch, MD 05/03/23 2314

## 2023-05-04 DIAGNOSIS — K921 Melena: Secondary | ICD-10-CM | POA: Diagnosis not present

## 2023-05-04 DIAGNOSIS — I5032 Chronic diastolic (congestive) heart failure: Secondary | ICD-10-CM | POA: Diagnosis not present

## 2023-05-04 DIAGNOSIS — N179 Acute kidney failure, unspecified: Secondary | ICD-10-CM | POA: Diagnosis not present

## 2023-05-04 DIAGNOSIS — I350 Nonrheumatic aortic (valve) stenosis: Secondary | ICD-10-CM | POA: Diagnosis not present

## 2023-05-04 DIAGNOSIS — D649 Anemia, unspecified: Secondary | ICD-10-CM | POA: Diagnosis not present

## 2023-05-04 LAB — BASIC METABOLIC PANEL
Anion gap: 10 (ref 5–15)
BUN: 92 mg/dL — ABNORMAL HIGH (ref 8–23)
CO2: 19 mmol/L — ABNORMAL LOW (ref 22–32)
Calcium: 8.9 mg/dL (ref 8.9–10.3)
Chloride: 108 mmol/L (ref 98–111)
Creatinine, Ser: 2.36 mg/dL — ABNORMAL HIGH (ref 0.44–1.00)
GFR, Estimated: 19 mL/min — ABNORMAL LOW (ref >60)
Glucose, Bld: 188 mg/dL — ABNORMAL HIGH (ref 70–99)
Potassium: 3.1 mmol/L — ABNORMAL LOW (ref 3.5–5.1)
Sodium: 137 mmol/L (ref 135–145)

## 2023-05-04 LAB — GLUCOSE, CAPILLARY
Glucose-Capillary: 139 mg/dL — ABNORMAL HIGH (ref 70–99)
Glucose-Capillary: 143 mg/dL — ABNORMAL HIGH (ref 70–99)
Glucose-Capillary: 167 mg/dL — ABNORMAL HIGH (ref 70–99)
Glucose-Capillary: 174 mg/dL — ABNORMAL HIGH (ref 70–99)
Glucose-Capillary: 174 mg/dL — ABNORMAL HIGH (ref 70–99)
Glucose-Capillary: 176 mg/dL — ABNORMAL HIGH (ref 70–99)
Glucose-Capillary: 180 mg/dL — ABNORMAL HIGH (ref 70–99)

## 2023-05-04 LAB — HEMOGLOBIN AND HEMATOCRIT, BLOOD
HCT: 26.6 % — ABNORMAL LOW (ref 36.0–46.0)
HCT: 26.2 % — ABNORMAL LOW (ref 36.0–46.0)
HCT: 28.5 % — ABNORMAL LOW (ref 36.0–46.0)
Hemoglobin: 7.9 g/dL — ABNORMAL LOW (ref 12.0–15.0)
Hemoglobin: 8.1 g/dL — ABNORMAL LOW (ref 12.0–15.0)
Hemoglobin: 8.4 g/dL — ABNORMAL LOW (ref 12.0–15.0)

## 2023-05-04 LAB — HEMOGLOBIN A1C
Hgb A1c MFr Bld: 6.6 % — ABNORMAL HIGH (ref 4.8–5.6)
Mean Plasma Glucose: 142.72 mg/dL

## 2023-05-04 LAB — MRSA NEXT GEN BY PCR, NASAL: MRSA by PCR Next Gen: DETECTED — AB

## 2023-05-04 LAB — MAGNESIUM: Magnesium: 2.8 mg/dL — ABNORMAL HIGH (ref 1.7–2.4)

## 2023-05-04 MED ORDER — MUPIROCIN 2 % EX OINT
1.0000 | TOPICAL_OINTMENT | Freq: Two times a day (BID) | CUTANEOUS | Status: AC
  Administered 2023-05-04 – 2023-05-08 (×10): 1 via NASAL
  Filled 2023-05-04 (×2): qty 22

## 2023-05-04 MED ORDER — LACTATED RINGERS IV SOLN: INTRAVENOUS | Status: AC

## 2023-05-04 MED ORDER — POTASSIUM CHLORIDE 10 MEQ/100ML IV SOLN
10.0000 meq | INTRAVENOUS | Status: AC
  Administered 2023-05-04 (×4): 10 meq via INTRAVENOUS
  Filled 2023-05-04 (×4): qty 100

## 2023-05-04 NOTE — Assessment & Plan Note (Signed)
-   A1c 6.6% on 05/04/2023 -Continue SSI and CBG monitoring

## 2023-05-04 NOTE — Assessment & Plan Note (Signed)
-   patient has history of CKD4. Baseline creat ~ 2.2 - 2.4, eGFR~ 20 - patient presents with increase in creat >0.3 mg/dL above baseline or creat increase >1.5x baseline presumed to have occurred within past 7 days PTA - creat 2.77 on admission - s/p IVF and improved to 2.36 this morning

## 2023-05-04 NOTE — Assessment & Plan Note (Signed)
-   Blood pressures soft on admission - Holding antihypertensives, resuming as needed

## 2023-05-04 NOTE — Progress Notes (Signed)
 HISTORY OF PRESENT ILLNESS:  [name] [name] is a 89 y.o. female with multiple significant medical problems admitted yesterday with acute on chronic anemia associated with melena.  Admission hemoglobin 5.4.  Most recent hemoglobin 8.1 after 2 units of packed red blood cells.  Patient tells me that she has had no bowel movement since admission.  Feeling better since of blood transfusions.  On liquids.  PPI. REVIEW OF SYSTEMS:  All non-GI ROS negative except for arthritis  Past Medical History:  Diagnosis Date   Anemia, unspecified    Arthritis    "knees; right shoulder" (09/15/2013)   Basal cell carcinoma    "right upper outer lip"   DM neuropathy, type II diabetes mellitus (HCC)    Dyspnea 2021   with low iron   GERD (gastroesophageal reflux disease)    Gout    HCAP (healthcare-associated pneumonia) 01/03/2022   High cholesterol    Hypertension    Hypothyroidism    Meniere's disease    Obesity (BMI 30-39.9)    Other forms of epilepsy and recurrent seizures without mention of intractable epilepsy 11/04/2012   Non convulsive paroxysmal spells, responding to [facility], patient not driving.    Pneumonia ~ 1943   Severe sepsis (HCC) 01/03/2022   Skin cancer    "forehead; right hand"   Stage 4 chronic kidney disease (HCC)    UTI (urinary tract infection)    Vitamin D deficiency     Past Surgical History:  Procedure Laterality Date   BIOPSY  04/05/2020   Procedure: BIOPSY;  Surgeon: Beverley Fiedler, MD;  [facility];  Service: Gastroenterology;;   CERVICAL POLYPECTOMY     COLONOSCOPY WITH PROPOFOL N/A 04/06/2020   Procedure: COLONOSCOPY WITH PROPOFOL;  Surgeon: Beverley Fiedler, MD;  [facility];  Service: Gastroenterology;  Laterality: N/A;   ESOPHAGOGASTRODUODENOSCOPY (EGD) WITH PROPOFOL N/A 04/05/2020   Procedure: ESOPHAGOGASTRODUODENOSCOPY (EGD) WITH PROPOFOL;  Surgeon: Beverley Fiedler, MD;  [facility];  Service: Gastroenterology;  Laterality: N/A;    HAMMER TOE SURGERY Left 1980's   IR FLUORO GUIDE CV LINE RIGHT  01/09/2022   IR PERC TUN PERIT CATH WO PORT S&I /IMAG  01/09/2022   IR REMOVAL TUN CV CATH W/O FL  02/15/2022   IR US GUIDE VASC ACCESS RIGHT  01/09/2022   MOHS SURGERY  20087   "right upper outer lip"   POLYPECTOMY  04/06/2020   Procedure: POLYPECTOMY;  Surgeon: Beverley Fiedler, MD;  [facility];  Service: Gastroenterology;;   TONSILLECTOMY  ~ 1947   WRIST SURGERY Left ~ 1950   "ran arm thru window"    Social History [name] [name]  reports that she quit smoking about 39 years ago. Her smoking use included cigarettes. She started smoking about 69 years ago. She has a 30 pack-year smoking history. She has never used smokeless tobacco. She reports that she does not currently use alcohol. She reports that she does not use drugs.  family history includes Alcohol abuse in her son; Diabetes in her son; Heart disease in her father and mother; Hypertension in her son and son; Ovarian cancer in her sister; Stroke in her mother.  Allergies  Allergen Reactions   Ppd [Tuberculin Purified Protein Derivative] Other (See Comments)    indurated   Penicillins Other (See Comments)    Unknown        PHYSICAL EXAMINATION: Vital signs: BP (!) 119/38 (BP Location: Left Arm)   Pulse 85   Temp 99.2 F (  37.3 C) (Oral)   Resp 20   Ht 5\' 2"  (1.575 m)   Wt 66.2 kg   SpO2 94%   BMI 26.69 kg/m  General: Well-developed, well-nourished, no acute distress.  Pale HEENT: Sclerae are anicteric, conjunctiva pink. Oral mucosa intact Lungs: Clear Heart: Regular Abdomen: soft, nontender, nondistended, no obvious ascites, no peritoneal signs, normal bowel sounds. No organomegaly. Extremities: No edema Psychiatric: alert and oriented x3. Cooperative     ASSESSMENT:  1.  Acute on chronic anemia 2.  Melena.  Elevated BUN, above baseline.  Both consistent with upper GI bleed 3.  Multiple significant medical problems   PLAN:  1.   Continue clears 2.  Continue IV PPI 3.  Continue to monitor stools and hemoglobin 4.  Transfuse for hemoglobin less than 7 5.  Plan upper endoscopy/enteroscopy are afternoon with Dr. Leone Payor.  The patient is higher than baseline risk given her age and multiple comorbid.The nature of the procedure, as well as the risks, benefits, and alternatives were carefully and thoroughly reviewed with the patient. Ample time for discussion and questions allowed. The patient understood, was satisfied, and agreed to proceed.  [name]. Eda Keys., M.D. [facility] of Gastroenterology

## 2023-05-04 NOTE — Plan of Care (Signed)
  Problem: Education: Goal: Knowledge of General Education information will improve Description: Including pain rating scale, medication(s)/side effects and non-pharmacologic comfort measures Outcome: Progressing   Problem: Health Behavior/Discharge Planning: Goal: Ability to manage health-related needs will improve Outcome: Progressing   Problem: Clinical Measurements: Goal: Ability to maintain clinical measurements within normal limits will improve Outcome: Progressing   Problem: Activity: Goal: Risk for activity intolerance will decrease Outcome: Progressing   Problem: Coping: Goal: Level of anxiety will decrease Outcome: Progressing   Problem: Elimination: Goal: Will not experience complications related to urinary retention Outcome: Progressing   Problem: Pain Managment: Goal: General experience of comfort will improve and/or be controlled Outcome: Progressing

## 2023-05-04 NOTE — Assessment & Plan Note (Addendum)
-   Severe AS noted on recent echo from 02/11/2023 -Follows with cardiology and has been discussing potential TAVR but some concern for progression of CKD; last seen on 03/07/2023 - Further discussions pending TAVR after ongoing nephrology follow-ups

## 2023-05-04 NOTE — Assessment & Plan Note (Signed)
 Continue Keppra

## 2023-05-04 NOTE — Hospital Course (Signed)
 [name] [name] is an 89 yo female with PMH gastritis, polyps, diverticulosis, hemorrhoids, HTN, DMII, HFpEF, AS, pulm HTN, CKDIV, anemia who presented with melanotic stools and fatigue.  She also endorsed some mild upper abdominal discomfort with associated nausea. Stools have been dark for a few days prior to admission. Initial hemoglobin found to be 5.4 g/dL in the ER. GI was consulted and she was admitted for blood transfusion and further workup.

## 2023-05-04 NOTE — Assessment & Plan Note (Signed)
-   Presented with melanotic stools and hemoglobin of 5.4 g/dL - Known history of prior gastritis on EGD.  Not taking NSAIDs or other agents to precipitate upper GIB except for daily aspirin -Received 2 units PRBC on admission, follow-up hemoglobin 7.9 g/dL this morning - GI following - Continue n.p.o. for now until further GI recommendations - Continue Protonix - Continue fluids while n.p.o. - Continue trending H/H

## 2023-05-04 NOTE — Assessment & Plan Note (Signed)
-   Uses nightly oxygen instead of CPAP

## 2023-05-04 NOTE — Assessment & Plan Note (Addendum)
-   No signs/symptoms of exacerbation - Recent echo, 02/11/2023: EF 70 to 75%, no RWMA, grade 2 diastolic dysfunction; severe AS -Lasix on hold for now -Monitor while on fluids

## 2023-05-04 NOTE — Progress Notes (Signed)
 Progress Note    [name] [name]   CZY:[id number]  DOB: 21-Feb-1932  DOA: 05/03/2023     1 PCP: Eloisa Northern, MD  Initial CC: Melena  Hospital Course: [name] [name] is an 89 yo female with PMH gastritis, polyps, diverticulosis, hemorrhoids, HTN, DMII, HFpEF, AS, pulm HTN, CKDIV, anemia who presented with melanotic stools and fatigue.  She also endorsed some mild upper abdominal discomfort with associated nausea. Stools have been dark for a few days prior to admission. Initial hemoglobin found to be 5.4 g/dL in the ER. GI was consulted and she was admitted for blood transfusion and further workup.  Interval History:  Resting comfortably when seen this morning.  No further melena since prior to admission.  Denies any significant abdominal pain.  Reviewed hemoglobin posttransfusion with her.  Assessment and Plan: * Symptomatic anemia - Presented with melanotic stools and hemoglobin of 5.4 g/dL - Known history of prior gastritis on EGD.  Not taking NSAIDs or other agents to precipitate upper GIB except for daily aspirin -Received 2 units PRBC on admission, follow-up hemoglobin 7.9 g/dL this morning - GI following - Continue n.p.o. for now until further GI recommendations - Continue Protonix - Continue fluids while n.p.o. - Continue trending H/H  Acute renal failure superimposed on stage 4 chronic kidney disease (HCC) - patient has history of CKD4. Baseline creat ~ 2.2 - 2.4, eGFR~ 20 - patient presents with increase in creat >0.3 mg/dL above baseline or creat increase >1.5x baseline presumed to have occurred within past 7 days PTA - creat 2.77 on admission - s/p IVF and improved to 2.36 this morning    Chronic heart failure with preserved ejection fraction (HFpEF) (HCC) - No signs/symptoms of exacerbation - Recent echo, 02/11/2023: EF 70 to 75%, no RWMA, grade 2 diastolic dysfunction; severe AS -Lasix on hold for now -Monitor while on fluids  Aortic stenosis - Severe AS noted on  recent echo from 02/11/2023 -Follows with cardiology and has been discussing potential TAVR but some concern for progression of CKD; last seen on 03/07/2023 - Further discussions pending TAVR after ongoing nephrology follow-ups  Type 2 diabetes mellitus with diabetic chronic kidney disease (HCC) - A1c 6.6% on 05/04/2023 -Continue SSI and CBG monitoring  OSA (obstructive sleep apnea) - Uses nightly oxygen instead of CPAP  Seizure disorder (HCC) - Continue Keppra  Essential hypertension - Blood pressures soft on admission - Holding antihypertensives, resuming as needed       Old records reviewed in assessment of this patient  Antimicrobials:   DVT prophylaxis:  SCDs Start: 05/03/23 2102   Code Status:   Code Status: Limited: Do not attempt resuscitation (DNR) -DNR-LIMITED -Do Not Intubate/DNI   Mobility Assessment (Last 72 Hours)     Mobility Assessment   No documentation.           Barriers to discharge:  Disposition Plan: Home HH orders placed: N/A Status is: Inpatient  Objective: Blood pressure (!) 124/37, pulse 84, temperature 99.2 F (37.3 C), temperature source Oral, resp. rate 19, height 5\' 2"  (1.575 m), weight 66.2 kg, SpO2 94%.  Examination:  Physical Exam Constitutional:      Appearance: Normal appearance.  HENT:     Head: Normocephalic and atraumatic.     Mouth/Throat:     Mouth: Mucous membranes are moist.  Eyes:     Extraocular Movements: Extraocular movements intact.  Cardiovascular:     Rate and Rhythm: Normal rate and regular rhythm.     Heart sounds: Murmur  heard.  Pulmonary:     Effort: Pulmonary effort is normal. No respiratory distress.     Breath sounds: Normal breath sounds. No wheezing.  Abdominal:     General: Bowel sounds are normal. There is no distension.     Palpations: Abdomen is soft.     Tenderness: There is no abdominal tenderness.  Musculoskeletal:        General: Normal range of motion.     Cervical back: Normal  range of motion and neck supple.  Skin:    General: Skin is warm and dry.  Neurological:     General: No focal deficit present.     Mental Status: She is alert.  Psychiatric:        Mood and Affect: Mood normal.      Consultants:  GI  Procedures:    Data Reviewed: Results for orders placed or performed during the hospital encounter of 05/03/23 (from the past 24 hours)  Basic metabolic panel     Status: Abnormal   Collection Time: 05/03/23 12:59 PM  Result Value Ref Range   Sodium 136 135 - 145 mmol/L   Potassium 3.6 3.5 - 5.1 mmol/L   Chloride 105 98 - 111 mmol/L   CO2 21 (L) 22 - 32 mmol/L   Glucose, Bld 292 (H) 70 - 99 mg/dL   BUN 161 (H) 8 - 23 mg/dL   Creatinine, Ser 0.96 (H) 0.44 - 1.00 mg/dL   Calcium 9.0 8.9 - 04.5 mg/dL   GFR, Estimated 16 (L) >60 mL/min   Anion gap 10 5 - 15  CBC with Differential     Status: Abnormal   Collection Time: 05/03/23 12:59 PM  Result Value Ref Range   WBC 6.1 4.0 - 10.5 K/uL   RBC 2.00 (L) 3.87 - 5.11 MIL/uL   Hemoglobin 5.4 (LL) 12.0 - 15.0 g/dL   HCT 40.9 (L) 81.1 - 91.4 %   MCV 91.5 80.0 - 100.0 fL   MCH 27.0 26.0 - 34.0 pg   MCHC 29.5 (L) 30.0 - 36.0 g/dL   RDW 78.2 (H) 95.6 - 21.3 %   Platelets 329 150 - 400 K/uL   nRBC 0.8 (H) 0.0 - 0.2 %   Neutrophils Relative % 83 %   Neutro Abs 5.0 1.7 - 7.7 K/uL   Lymphocytes Relative 6 %   Lymphs Abs 0.3 (L) 0.7 - 4.0 K/uL   Monocytes Relative 5 %   Monocytes Absolute 0.3 0.1 - 1.0 K/uL   Eosinophils Relative 5 %   Eosinophils Absolute 0.3 0.0 - 0.5 K/uL   Basophils Relative 0 %   Basophils Absolute 0.0 0.0 - 0.1 K/uL   Immature Granulocytes 1 %   Abs Immature Granulocytes 0.07 0.00 - 0.07 K/uL  Troponin I (High Sensitivity)     Status: Abnormal   Collection Time: 05/03/23 12:59 PM  Result Value Ref Range   Troponin I (High Sensitivity) 24 (H) <18 ng/L  Resp panel by RT-PCR (RSV, Flu A&B, Covid) Anterior Nasal Swab     Status: None   Collection Time: 05/03/23  1:00 PM    Specimen: Anterior Nasal Swab  Result Value Ref Range   SARS Coronavirus 2 by RT PCR NEGATIVE NEGATIVE   Influenza A by PCR NEGATIVE NEGATIVE   Influenza B by PCR NEGATIVE NEGATIVE   Resp Syncytial Virus by PCR NEGATIVE NEGATIVE  Occult blood card to lab, stool     Status: Abnormal   Collection Time: 05/03/23  1:33 PM  Result Value Ref Range   Fecal Occult Bld POSITIVE (A) NEGATIVE  Troponin I (High Sensitivity)     Status: Abnormal   Collection Time: 05/03/23  3:22 PM  Result Value Ref Range   Troponin I (High Sensitivity) 23 (H) <18 ng/L  Type and screen [facility]     Status: None (Preliminary result)   Collection Time: 05/03/23  4:38 PM  Result Value Ref Range   ABO/RH(D) AB POS    Antibody Screen NEG    Sample Expiration 05/06/2023,2359    Unit Number [id number]    Blood Component Type RED CELLS,LR    Unit division 00    Status of Unit ISSUED    Transfusion Status OK TO TRANSFUSE    Crossmatch Result Compatible    Unit Number [id number]    Blood Component Type RBC LR PHER2    Unit division 00    Status of Unit ISSUED    Transfusion Status OK TO TRANSFUSE    Crossmatch Result      Compatible Performed at [facility], [address], [name], [place] [address]   Prepare RBC (crossmatch)     Status: None   Collection Time: 05/03/23  4:47 PM  Result Value Ref Range   Order Confirmation      ORDER PROCESSED BY BLOOD BANK Performed at [facility], [address], [place], [place] [address]   CBG monitoring, ED     Status: Abnormal   Collection Time: 05/03/23  9:26 PM  Result Value Ref Range   Glucose-Capillary 157 (H) 70 - 99 mg/dL  MRSA Next Gen by PCR, Nasal     Status: Abnormal   Collection Time: 05/03/23 10:01 PM   Specimen: Nasal Mucosa; Nasal Swab  Result Value Ref Range   MRSA by PCR Next Gen DETECTED (A) NOT DETECTED  Glucose, capillary     Status: Abnormal   Collection Time: 05/04/23 12:35 AM   Result Value Ref Range   Glucose-Capillary 176 (H) 70 - 99 mg/dL   Comment 1 Notify RN   Glucose, capillary     Status: Abnormal   Collection Time: 05/04/23  5:15 AM  Result Value Ref Range   Glucose-Capillary 167 (H) 70 - 99 mg/dL  Hemoglobin G2X     Status: Abnormal   Collection Time: 05/04/23  5:20 AM  Result Value Ref Range   Hgb A1c MFr Bld 6.6 (H) 4.8 - 5.6 %   Mean Plasma Glucose 142.72 mg/dL  Basic metabolic panel     Status: Abnormal   Collection Time: 05/04/23  5:20 AM  Result Value Ref Range   Sodium 137 135 - 145 mmol/L   Potassium 3.1 (L) 3.5 - 5.1 mmol/L   Chloride 108 98 - 111 mmol/L   CO2 19 (L) 22 - 32 mmol/L   Glucose, Bld 188 (H) 70 - 99 mg/dL   BUN 92 (H) 8 - 23 mg/dL   Creatinine, Ser 5.28 (H) 0.44 - 1.00 mg/dL   Calcium 8.9 8.9 - 41.3 mg/dL   GFR, Estimated 19 (L) >60 mL/min   Anion gap 10 5 - 15  Magnesium     Status: Abnormal   Collection Time: 05/04/23  5:20 AM  Result Value Ref Range   Magnesium 2.8 (H) 1.7 - 2.4 mg/dL  Hemoglobin and hematocrit, blood     Status: Abnormal   Collection Time: 05/04/23  5:20 AM  Result Value Ref Range   Hemoglobin 7.9 (L) 12.0 -  15.0 g/dL   HCT 09.8 (L) 11.9 - 14.7 %  Glucose, capillary     Status: Abnormal   Collection Time: 05/04/23  7:54 AM  Result Value Ref Range   Glucose-Capillary 174 (H) 70 - 99 mg/dL    I have reviewed pertinent nursing notes, vitals, labs, and images as necessary. I have ordered labwork to follow up on as indicated.  I have reviewed the last notes from staff over past 24 hours. I have discussed patient's care plan and test results with nursing staff, CM/SW, and other staff as appropriate.  Time spent: Greater than 50% of the 55 minute visit was spent in counseling/coordination of care for the patient as laid out in the A&P.   LOS: 1 day   Lewie Chamber, MD [facility] 05/04/2023, 10:34 AM

## 2023-05-05 ENCOUNTER — Encounter (HOSPITAL_COMMUNITY)
Admission: EM | Discharge status: Home / Self Care | Payer: Self-pay | Source: Home / Self Care | Attending: Internal Medicine

## 2023-05-05 ENCOUNTER — Encounter (HOSPITAL_COMMUNITY): Payer: Self-pay | Admitting: Family Medicine

## 2023-05-05 ENCOUNTER — Inpatient Hospital Stay (HOSPITAL_COMMUNITY): Admitting: Anesthesiology

## 2023-05-05 DIAGNOSIS — K921 Melena: Secondary | ICD-10-CM | POA: Diagnosis not present

## 2023-05-05 DIAGNOSIS — K2950 Unspecified chronic gastritis without bleeding: Secondary | ICD-10-CM | POA: Diagnosis not present

## 2023-05-05 DIAGNOSIS — N184 Chronic kidney disease, stage 4 (severe): Secondary | ICD-10-CM | POA: Diagnosis not present

## 2023-05-05 DIAGNOSIS — D649 Anemia, unspecified: Secondary | ICD-10-CM | POA: Diagnosis not present

## 2023-05-05 DIAGNOSIS — N179 Acute kidney failure, unspecified: Secondary | ICD-10-CM | POA: Diagnosis not present

## 2023-05-05 DIAGNOSIS — I5032 Chronic diastolic (congestive) heart failure: Secondary | ICD-10-CM

## 2023-05-05 DIAGNOSIS — K2970 Gastritis, unspecified, without bleeding: Secondary | ICD-10-CM

## 2023-05-05 DIAGNOSIS — I130 Hypertensive heart and chronic kidney disease with heart failure and stage 1 through stage 4 chronic kidney disease, or unspecified chronic kidney disease: Secondary | ICD-10-CM

## 2023-05-05 LAB — TYPE AND SCREEN
ABO/RH(D): AB POS
Antibody Screen: NEGATIVE
Unit division: 0
Unit division: 0

## 2023-05-05 LAB — HEMOGLOBIN AND HEMATOCRIT, BLOOD
HCT: 26.5 % — ABNORMAL LOW (ref 36.0–46.0)
Hemoglobin: 8 g/dL — ABNORMAL LOW (ref 12.0–15.0)

## 2023-05-05 LAB — GLUCOSE, CAPILLARY
Glucose-Capillary: 141 mg/dL — ABNORMAL HIGH (ref 70–99)
Glucose-Capillary: 146 mg/dL — ABNORMAL HIGH (ref 70–99)
Glucose-Capillary: 157 mg/dL — ABNORMAL HIGH (ref 70–99)
Glucose-Capillary: 165 mg/dL — ABNORMAL HIGH (ref 70–99)
Glucose-Capillary: 191 mg/dL — ABNORMAL HIGH (ref 70–99)
Glucose-Capillary: 216 mg/dL — ABNORMAL HIGH (ref 70–99)

## 2023-05-05 LAB — BASIC METABOLIC PANEL
Anion gap: 10 (ref 5–15)
BUN: 63 mg/dL — ABNORMAL HIGH (ref 8–23)
CO2: 19 mmol/L — ABNORMAL LOW (ref 22–32)
Calcium: 9.1 mg/dL (ref 8.9–10.3)
Chloride: 111 mmol/L (ref 98–111)
Creatinine, Ser: 1.97 mg/dL — ABNORMAL HIGH (ref 0.44–1.00)
GFR, Estimated: 24 mL/min — ABNORMAL LOW (ref >60)
Glucose, Bld: 182 mg/dL — ABNORMAL HIGH (ref 70–99)
Potassium: 3.9 mmol/L (ref 3.5–5.1)
Sodium: 140 mmol/L (ref 135–145)

## 2023-05-05 LAB — BPAM RBC
Blood Product Expiration Date: 202504162359
Blood Product Expiration Date: 202504182359
ISSUE DATE / TIME: 202503151756
ISSUE DATE / TIME: 202503152342
Unit Type and Rh: 6200
Unit Type and Rh: 6200

## 2023-05-05 SURGERY — ENTEROSCOPY
Anesthesia: Monitor Anesthesia Care

## 2023-05-05 MED ORDER — LACTATED RINGERS IV SOLN: INTRAVENOUS | Status: DC | PRN

## 2023-05-05 MED ORDER — ACETAMINOPHEN 650 MG RE SUPP: 650.0000 mg | Freq: Once | RECTAL | Status: DC

## 2023-05-05 MED ORDER — POLYETHYLENE GLYCOL 3350 17 GM/SCOOP PO POWD
119.0000 g | Freq: Once | ORAL | Status: AC
  Administered 2023-05-06: 119 g via ORAL
  Filled 2023-05-05 (×2): qty 119

## 2023-05-05 MED ORDER — SODIUM CHLORIDE 0.9 % IV SOLN: INTRAVENOUS | Status: AC

## 2023-05-05 MED ORDER — PHENYLEPHRINE HCL (PRESSORS) 10 MG/ML IV SOLN
INTRAVENOUS | Status: DC | PRN
Start: 2023-05-05 — End: 2023-05-05
  Administered 2023-05-05: 160 ug via INTRAVENOUS

## 2023-05-05 MED ORDER — METOPROLOL TARTRATE 5 MG/5ML IV SOLN
5.0000 mg | INTRAVENOUS | Status: DC | PRN
  Administered 2023-05-05: 5 mg via INTRAVENOUS
  Filled 2023-05-05: qty 5

## 2023-05-05 MED ORDER — LIDOCAINE HCL (CARDIAC) PF 100 MG/5ML IV SOSY
PREFILLED_SYRINGE | INTRAVENOUS | Status: DC | PRN
  Administered 2023-05-05: 60 mg via INTRAVENOUS

## 2023-05-05 MED ORDER — POLYETHYLENE GLYCOL 3350 17 GM/SCOOP PO POWD
119.0000 g | Freq: Once | ORAL | Status: AC
  Administered 2023-05-05: 119 g via ORAL
  Filled 2023-05-05: qty 119

## 2023-05-05 MED ORDER — SPOT INK MARKER SYRINGE KIT
PACK | SUBMUCOSAL | Status: DC | PRN
  Administered 2023-05-05: 3.5 mL via SUBMUCOSAL

## 2023-05-05 MED ORDER — METOCLOPRAMIDE HCL 5 MG/ML IJ SOLN
5.0000 mg | Freq: Once | INTRAMUSCULAR | Status: AC
  Administered 2023-05-05: 5 mg via INTRAVENOUS
  Filled 2023-05-05: qty 2

## 2023-05-05 MED ORDER — SPOT INK MARKER SYRINGE KIT
PACK | SUBMUCOSAL | Status: AC
  Filled 2023-05-05: qty 5

## 2023-05-05 MED ORDER — BISACODYL 5 MG PO TBEC
10.0000 mg | DELAYED_RELEASE_TABLET | Freq: Once | ORAL | Status: AC
  Administered 2023-05-05: 10 mg via ORAL
  Filled 2023-05-05: qty 2

## 2023-05-05 MED ORDER — METOCLOPRAMIDE HCL 5 MG/ML IJ SOLN
5.0000 mg | Freq: Once | INTRAMUSCULAR | Status: AC
  Administered 2023-05-06: 5 mg via INTRAVENOUS
  Filled 2023-05-05: qty 2

## 2023-05-05 MED ORDER — PROPOFOL 500 MG/50ML IV EMUL
INTRAVENOUS | Status: DC | PRN
  Administered 2023-05-05: 20 mg via INTRAVENOUS
  Administered 2023-05-05: 100 ug/kg/min via INTRAVENOUS

## 2023-05-05 MED ORDER — PHENYLEPHRINE HCL-NACL 20-0.9 MG/250ML-% IV SOLN
INTRAVENOUS | Status: DC | PRN
Start: 2023-05-05 — End: 2023-05-05
  Administered 2023-05-05: 45 ug/min via INTRAVENOUS

## 2023-05-05 NOTE — H&P (View-Only) (Signed)
 Progress Note   Subjective  Day #3 Chief Complaint: Acute on chronic anemia with melena  Today, patient tells me that she feels okay just tired.  She is aware of plans for EGD/enteroscopy today.  No further black bowel movement since admission.   Objective   Vital signs in last 24 hours: Temp:  [97.6 F (36.4 C)-98.5 F (36.9 C)] 97.9 F (36.6 C) (03/17 0800) Pulse Rate:  [81-103] 88 (03/17 0800) Resp:  [13-24] 19 (03/17 0800) BP: (99-134)/(30-47) 112/34 (03/17 0800) SpO2:  [83 %-98 %] 97 % (03/17 0800) Last BM Date : 05/01/23 General:   Pale appearing white female in NAD Heart:  Regular rate and rhythm; no murmurs Lungs: Respirations even and unlabored, lungs CTA bilaterally Abdomen:  Soft, nontender and nondistended. Normal bowel sounds. Psych:  Cooperative. Normal mood and affect.  Intake/Output from previous day: 03/16 0701 - 03/17 0700 In: 1945.5 [I.V.:1544.9; IV Piggyback:400.6] Out: -  Intake/Output this shift: Total I/O In: 148 [I.V.:148] Out: -   Lab Results: Recent Labs    05/03/23 1259 05/04/23 0520 05/04/23 1319 05/04/23 2047 05/05/23 0432  WBC 6.1  --   --   --   --   HGB 5.4*   < > 8.1* 8.4* 8.0*  HCT 18.3*   < > 26.2* 28.5* 26.5*  PLT 329  --   --   --   --    < > = values in this interval not displayed.   BMET Recent Labs    05/03/23 1259 05/04/23 0520 05/05/23 0432  NA 136 137 140  K 3.6 3.1* 3.9  CL 105 108 111  CO2 21* 19* 19*  GLUCOSE 292* 188* 182*  BUN 100* 92* 63*  CREATININE 2.77* 2.36* 1.97*  CALCIUM 9.0 8.9 9.1   Studies/Results: DG Chest Portable 1 View Result Date: 05/03/2023 CLINICAL DATA:  Shortness of breath, weakness, hypotension. EXAM: PORTABLE CHEST 1 VIEW COMPARISON:  02/11/2023 FINDINGS: Cardiomegaly. Chronic aortic atherosclerotic calcification. The right lung is clear. Mild increased density at the left lung base, improved since the study of December. This could represent residual scarring, but a degree of  ongoing volume loss or left base pneumonia is not excluded. No evidence of heart failure or edema. No acute bone finding. IMPRESSION: 1. Cardiomegaly. Chronic aortic atherosclerotic calcification. 2. Mild increased density at the left lung base, improved since the study of December. This could represent residual scarring, but a degree of ongoing volume loss or left base pneumonia is not excluded. Electronically Signed   By: Paulina Fusi M.D.   On: 05/03/2023 13:25    Assessment / Plan:   Assessment: 1.  Acute on chronic anemia: Presented with melanotic stools and a hemoglobin of 5.4--> 2 units PRBCs--> 8.1-->8.0, no further melena since admission 2.  Melena: With elevated BUN, above baseline, no further melena since admission 3.  Chronic heart failure with preserved ejection fraction: 02/11/2023 echo with an EF 70-75% 4.  Acute renal failure on CKD stage IV: Baseline creatinine 2.2-2.4 5.  Severe aortic stenosis 6.  Type 2 diabetes 7.  OSA: On nightly oxygen instead of CPAP 8.  Seizure disorder  Plan: 1.  Patient will continue n.p.o.  until after time of procedure. 2.  EGD/enteroscopy is scheduled later this afternoon with Dr. Leone Payor.  Answered patient's questions about this procedure this morning 3.  Continue to monitor hemoglobin with transfusion as needed less than 7 4.  Continue IV PPI 5.  Please await further recommendations after  time of procedure.  Thank you for your kind consultation.  We will continue to follow.   LOS: 2 days   Unk Lightning  05/05/2023, 9:21 AM

## 2023-05-05 NOTE — Transfer of Care (Signed)
 Immediate Anesthesia Transfer of Care Note  Patient: [name] [name]  Procedure(s) Performed: Procedure(s): ENTEROSCOPY (N/A) TATTOOING, USING INTRADERMAL PIGMENT  Patient Location: PACU  Anesthesia Type:MAC  Level of Consciousness:  sedated, patient cooperative and responds to stimulation  Airway & Oxygen Therapy:Patient Spontanous Breathing and Patient connected to face mask oxgen  Post-op Assessment:  Report given to PACU RN and Post -op Vital signs reviewed and stable  Post vital signs:  Reviewed and stable  Last Vitals:  Vitals:   05/05/23 1200 05/05/23 1326  BP:  (!) 136/36  Pulse:  (!) 106  Resp:  20  Temp: 36.7 C   SpO2:  95%    Complications: No apparent anesthesia complications

## 2023-05-05 NOTE — Anesthesia Postprocedure Evaluation (Signed)
 Anesthesia Post Note  Patient: [name]  Procedure(s) Performed: ENTEROSCOPY TATTOOING, USING INTRADERMAL PIGMENT     Patient location during evaluation: Endoscopy Anesthesia Type: MAC Level of consciousness: awake and alert Pain management: pain level controlled Vital Signs Assessment: post-procedure vital signs reviewed and stable Respiratory status: spontaneous breathing, nonlabored ventilation, respiratory function stable and patient connected to nasal cannula oxygen Cardiovascular status: blood pressure returned to baseline and stable Postop Assessment: no apparent nausea or vomiting Anesthetic complications: no  No notable events documented.  Last Vitals:  Vitals:   05/05/23 1500 05/05/23 1510  BP: (!) 118/38 (!) 113/45  Pulse: (!) 101 100  Resp: (!) 24 (!) 21  Temp:    SpO2: 94% 95%    Last Pain:  Vitals:   05/05/23 1510  TempSrc:   PainSc: 0-No pain                 Savahanna Almendariz L Dallin Mccorkel

## 2023-05-05 NOTE — Op Note (Addendum)
 [facility] Patient Name: [name] [name] Procedure Date: 05/05/2023 MRN: [id number] Attending MD: Iva Boop , MD, [id number] Date of Birth: [birth year] CSN: [id number] Age: 89 Admit Type: Inpatient Procedure:                Small bowel enteroscopy Indications:              Melena Providers:                Iva Boop, MD, Jacquelyn "Jaci" Clelia Croft, RN,                            Rhodia Albright, Technician Referring MD:              Medicines:                Monitored Anesthesia Care Complications:            No immediate complications. Estimated Blood Loss:     Estimated blood loss was minimal. Procedure:                Pre-Anesthesia Assessment:                           - Prior to the procedure, a History and Physical                            was performed, and patient medications and                            allergies were reviewed. The patient's tolerance of                            previous anesthesia was also reviewed. The risks                            and benefits of the procedure and the sedation                            options and risks were discussed with the patient.                            All questions were answered, and informed consent                            was obtained. Prior Anticoagulants: The patient has                            taken no anticoagulant or antiplatelet agents. ASA                            Grade Assessment: IV - A patient with severe                            systemic disease that is a constant threat to life.  After reviewing the risks and benefits, the patient                            was deemed in satisfactory condition to undergo the                            procedure.                           After obtaining informed consent, the endoscope was                            passed under direct vision. Throughout the                            procedure, the patient's blood  pressure, pulse, and                            oxygen saturations were monitored continuously. The                            [id number]-HQ190L ([id number]) Olympus colonoscope was                            introduced through the mouth and advanced to the                            proximal jejunum. The small bowel enteroscopy was                            accomplished without difficulty. The patient                            tolerated the procedure well. Scope In: Scope Out: Findings:      There was no evidence of significant pathology in the proximal jejunum.       Area was tattooed with an injection of 3 mL of Spot (carbon black).       Estimated blood loss: none.      There was no evidence of significant pathology in the entire examined       duodenum.      Patchy mild inflammation characterized by congestion (edema), erythema       and granularity was found in the gastric antrum. normal gastric       retroflexion.      The examined esophagus was normal. Impression:               - The examined portion of the jejunum was normal.                            Tattooed at most distal point reached.                           - Normal examined duodenum.                           -  Chronic gastritis. Known from prior biopsies 2021                           - Normal esophagus.                           - No specimens collected. Recommendation:           - Return patient to hospital ward for ongoing care.                           - Will arrange for colonoscopy tomorrow as no cause                            of melena and anemia seen here- son Byrdie Miyazaki                            called and updated Procedure Code(s):        --- Professional ---                           [phone], Small intestinal endoscopy, enteroscopy                            beyond second portion of duodenum, not including                            ileum; diagnostic, including collection of                             specimen(s) by brushing or washing, when performed                            (separate procedure)                           44799, Unlisted procedure, small intestine Diagnosis Code(s):        --- Professional ---                           K29.50, Unspecified chronic gastritis without                            bleeding                           K92.1, Melena (includes Hematochezia) CPT copyright 2022 [employer]. All rights reserved. The codes documented in this report are preliminary and upon coder review may  be revised to meet current compliance requirements. Iva Boop, MD 05/05/2023 3:01:55 PM This report has been signed electronically. Number of Addenda: 0

## 2023-05-05 NOTE — Progress Notes (Signed)
 Progress Note   Subjective  Day #3 Chief Complaint: Acute on chronic anemia with melena  Today, patient tells me that she feels okay just tired.  She is aware of plans for EGD/enteroscopy today.  No further black bowel movement since admission.   Objective   Vital signs in last 24 hours: Temp:  [97.6 F (36.4 C)-98.5 F (36.9 C)] 97.9 F (36.6 C) (03/17 0800) Pulse Rate:  [81-103] 88 (03/17 0800) Resp:  [13-24] 19 (03/17 0800) BP: (99-134)/(30-47) 112/34 (03/17 0800) SpO2:  [83 %-98 %] 97 % (03/17 0800) Last BM Date : 05/01/23 General:   Pale appearing white female in NAD Heart:  Regular rate and rhythm; no murmurs Lungs: Respirations even and unlabored, lungs CTA bilaterally Abdomen:  Soft, nontender and nondistended. Normal bowel sounds. Psych:  Cooperative. Normal mood and affect.  Intake/Output from previous day: 03/16 0701 - 03/17 0700 In: 1945.5 [I.V.:1544.9; IV Piggyback:400.6] Out: -  Intake/Output this shift: Total I/O In: 148 [I.V.:148] Out: -   Lab Results: Recent Labs    05/03/23 1259 05/04/23 0520 05/04/23 1319 05/04/23 2047 05/05/23 0432  WBC 6.1  --   --   --   --   HGB 5.4*   < > 8.1* 8.4* 8.0*  HCT 18.3*   < > 26.2* 28.5* 26.5*  PLT 329  --   --   --   --    < > = values in this interval not displayed.   BMET Recent Labs    05/03/23 1259 05/04/23 0520 05/05/23 0432  NA 136 137 140  K 3.6 3.1* 3.9  CL 105 108 111  CO2 21* 19* 19*  GLUCOSE 292* 188* 182*  BUN 100* 92* 63*  CREATININE 2.77* 2.36* 1.97*  CALCIUM 9.0 8.9 9.1   Studies/Results: DG Chest Portable 1 View Result Date: 05/03/2023 CLINICAL DATA:  Shortness of breath, weakness, hypotension. EXAM: PORTABLE CHEST 1 VIEW COMPARISON:  02/11/2023 FINDINGS: Cardiomegaly. Chronic aortic atherosclerotic calcification. The right lung is clear. Mild increased density at the left lung base, improved since the study of December. This could represent residual scarring, but a degree of  ongoing volume loss or left base pneumonia is not excluded. No evidence of heart failure or edema. No acute bone finding. IMPRESSION: 1. Cardiomegaly. Chronic aortic atherosclerotic calcification. 2. Mild increased density at the left lung base, improved since the study of December. This could represent residual scarring, but a degree of ongoing volume loss or left base pneumonia is not excluded. Electronically Signed   By: Paulina Fusi M.D.   On: 05/03/2023 13:25    Assessment / Plan:   Assessment: 1.  Acute on chronic anemia: Presented with melanotic stools and a hemoglobin of 5.4--> 2 units PRBCs--> 8.1-->8.0, no further melena since admission 2.  Melena: With elevated BUN, above baseline, no further melena since admission 3.  Chronic heart failure with preserved ejection fraction: 02/11/2023 echo with an EF 70-75% 4.  Acute renal failure on CKD stage IV: Baseline creatinine 2.2-2.4 5.  Severe aortic stenosis 6.  Type 2 diabetes 7.  OSA: On nightly oxygen instead of CPAP 8.  Seizure disorder  Plan: 1.  Patient will continue n.p.o.  until after time of procedure. 2.  EGD/enteroscopy is scheduled later this afternoon with Dr. Leone Payor.  Answered patient's questions about this procedure this morning 3.  Continue to monitor hemoglobin with transfusion as needed less than 7 4.  Continue IV PPI 5.  Please await further recommendations after  time of procedure.  Thank you for your kind consultation.  We will continue to follow.   LOS: 2 days   Unk Lightning  05/05/2023, 9:21 AM

## 2023-05-05 NOTE — Progress Notes (Signed)
 Report called to receiving RN [id number]. Patient with no complaints at this time. Will transfer via WC.

## 2023-05-05 NOTE — Plan of Care (Signed)

## 2023-05-05 NOTE — Anesthesia Preprocedure Evaluation (Signed)
 Anesthesia Evaluation    Reviewed: Allergy & Precautions, Patient's Chart, lab work & pertinent test results  Airway Mallampati: III       Dental   Pulmonary sleep apnea , former smoker   Pulmonary exam normal        Cardiovascular hypertension, Pt. on medications and Pt. on home beta blockers + Valvular Problems/Murmurs (Severe aortic stenosis is present.  Aortic valve mean gradient measures  39.5 mmHg. Aortic valve peak gradient measures 66.3 mmHg. Aortic valve  area, by VTI measures 0.99 cm.) AS  Rhythm:Regular Rate:Normal + Systolic murmurs ECHO:  1. Left ventricular ejection fraction, by estimation, is 70 to 75%. The  left ventricle has hyperdynamic function. The left ventricle has no  regional wall motion abnormalities. Left ventricular diastolic parameters  are consistent with Grade II diastolic  dysfunction (pseudonormalization). There is the interventricular septum is  flattened in systole, consistent with right ventricular pressure overload.   2. Right ventricular systolic function is mildly reduced. The right  ventricular size is moderately enlarged. Mildly increased right  ventricular wall thickness. There is severely elevated pulmonary artery  systolic pressure. The estimated right  ventricular systolic pressure is 79.9 mmHg.   3. Left atrial size was mildly dilated.   4. Right atrial size was moderately dilated.   5. The mitral valve is normal in structure. Trivial mitral valve  regurgitation. Mild mitral stenosis. Moderate mitral annular  calcification.   6. Tricuspid valve regurgitation is moderate.   7. The aortic valve is tricuspid. There is severe calcifcation of the  aortic valve. There is severe thickening of the aortic valve. Aortic valve  regurgitation is trivial. Severe aortic valve stenosis. Aortic valve mean  gradient measures 39.5 mmHg.  Aortic valve Vmax measures 4.07 m/s. Aortic valve acceleration  time  measures 95 msec.   8. The inferior vena cava is dilated in size with >50% respiratory  variability, suggesting right atrial pressure of 8 mmHg.     Neuro/Psych Seizures -,     GI/Hepatic Neg liver ROS,GERD  Medicated and Controlled,,  Endo/Other  diabetesHypothyroidism    Renal/GU CRFRenal disease     Musculoskeletal  (+) Arthritis ,    Abdominal   Peds  Hematology  (+) Blood dyscrasia, anemia   Anesthesia Other Findings Anemia Melena  Reproductive/Obstetrics                             Anesthesia Physical Anesthesia Plan  ASA: 4  Anesthesia Plan: MAC   Post-op Pain Management:    Induction: Intravenous  PONV Risk Score and Plan: 2 and Propofol infusion and Treatment may vary due to age or medical condition  Airway Management Planned: Nasal Cannula  Additional Equipment:   Intra-op Plan:   Post-operative Plan:   Informed Consent: I have reviewed the patients History and Physical, chart, labs and discussed the procedure including the risks, benefits and alternatives for the proposed anesthesia with the patient or authorized representative who has indicated his/her understanding and acceptance.   Patient has DNR.  Discussed DNR with patient and Suspend DNR.   Dental advisory given  Plan Discussed with: CRNA  Anesthesia Plan Comments:         Anesthesia Quick Evaluation

## 2023-05-05 NOTE — Progress Notes (Signed)
 Chaplain attempted to visit with pt per Spiritual Care Consult; however, pt OOR for procedure. Chaplain will try again later.

## 2023-05-05 NOTE — Progress Notes (Signed)
   05/05/23 2059  BiPAP/CPAP/SIPAP  BiPAP/CPAP/SIPAP Pt Type Adult  Reason BIPAP/CPAP not in use Non-compliant (Pt refusing cpap for the night)

## 2023-05-05 NOTE — Progress Notes (Signed)
   05/05/23 1559  TOC Brief Assessment  Insurance and Status Reviewed  Patient has primary care physician Yes  Home environment has been reviewed Single family home  Prior level of function: Independent/modified independent  Prior/Current Home Services No current home services  Social Drivers of Health Review SDOH reviewed no interventions necessary  Readmission risk has been reviewed Yes  Transition of care needs transition of care needs identified, TOC will continue to follow

## 2023-05-05 NOTE — Progress Notes (Signed)
   05/05/23 1650  Spiritual Encounters  Type of Visit Initial  Care provided to: Patient  Referral source Patient request  Spiritual Framework  Presenting Themes Meaning/purpose/sources of inspiration  Patient Stress Factors Other (Comment) (loss of son)   Per patient request, I visited with [name] [name] to provided support and prayer.  [name] [name] shared feeling fatigued but is moving to step down room. She shared recent loss of son after cancer and this grief still weighs on her. She receives support from her community of faith.  I provided compassionate presence and active listening. I offered [name] prayer at patient's request.  She knows spiritual care remains available and how to request a chaplain. [name] [name], M.Div [phone]

## 2023-05-05 NOTE — Interval H&P Note (Signed)
 History and Physical Interval Note:  05/05/2023 2:15 PM  [name] [name]  has presented today for surgery, with the diagnosis of anemia/ melena.  The various methods of treatment have been discussed with the patient and family. After consideration of risks, benefits and other options for treatment, the patient has consented to  Procedure(s): ENTEROSCOPY (N/A) as a surgical intervention.  The patient's history has been reviewed, patient examined, no change in status, stable for surgery.  I have reviewed the patient's chart and labs.  Questions were answered to the patient's satisfaction.    There is a harsh 3/6 SEM (aortic stenosis)   Stan Head

## 2023-05-05 NOTE — Progress Notes (Signed)
 Progress Note    [name] [name]   GLO:[id number]  DOB: 1932-06-08  DOA: 05/03/2023     2 PCP: Eloisa Northern, MD  Initial CC: Melena  Hospital Course: [name] [name] is an 89 yo female with PMH gastritis, polyps, diverticulosis, hemorrhoids, HTN, DMII, HFpEF, AS, pulm HTN, CKDIV, anemia who presented with melanotic stools and fatigue.  She also endorsed some mild upper abdominal discomfort with associated nausea. Stools have been dark for a few days prior to admission. Initial hemoglobin found to be 5.4 g/dL in the ER. GI was consulted and she was admitted for blood transfusion and further workup.  Interval History:  No events overnight.  Still no further melena since Thursday.  Hemoglobin stable, 8 g/dL this morning.  Undergoing EGD with GI today.  Assessment and Plan: * Symptomatic anemia - Presented with melanotic stools and hemoglobin of 5.4 g/dL - Known history of prior gastritis on EGD.  Not taking NSAIDs or other agents to precipitate upper GIB except for daily aspirin -Received 2 units PRBC on admission - GI following - undergoing EGD today with GI - continue protonix - CBC in am  Acute renal failure superimposed on stage 4 chronic kidney disease (HCC) - patient has history of CKD4. Baseline creat ~ 2.2 - 2.4, eGFR~ 20 - patient presents with increase in creat >0.3 mg/dL above baseline or creat increase >1.5x baseline presumed to have occurred within past 7 days PTA - creat 2.77 on admission - s/p IVF and improved  -BMP in a.m.   Chronic heart failure with preserved ejection fraction (HFpEF) (HCC) - No signs/symptoms of exacerbation - Recent echo, 02/11/2023: EF 70 to 75%, no RWMA, grade 2 diastolic dysfunction; severe AS -Lasix on hold for now -Monitor while on fluids  Aortic stenosis - Severe AS noted on recent echo from 02/11/2023 -Follows with cardiology and has been discussing potential TAVR but some concern for progression of CKD; last seen on 03/07/2023 - Further  discussions pending TAVR after ongoing nephrology follow-ups  Type 2 diabetes mellitus with diabetic chronic kidney disease (HCC) - A1c 6.6% on 05/04/2023 -Continue SSI and CBG monitoring  OSA (obstructive sleep apnea) - Uses nightly oxygen instead of CPAP  Seizure disorder (HCC) - Continue Keppra  Essential hypertension - Blood pressures soft on admission - Holding antihypertensives, resuming as needed   Old records reviewed in assessment of this patient  Antimicrobials:   DVT prophylaxis:  SCDs Start: 05/03/23 2102   Code Status:   Code Status: Limited: Do not attempt resuscitation (DNR) -DNR-LIMITED -Do Not Intubate/DNI   Mobility Assessment (Last 72 Hours)     Mobility Assessment   No documentation.           Barriers to discharge:  Disposition Plan: Home HH orders placed: N/A Status is: Inpatient  Objective: Blood pressure (!) 136/36, pulse (!) 106, temperature 98.1 F (36.7 C), temperature source Oral, resp. rate 20, height 5\' 2"  (1.575 m), weight 66.2 kg, SpO2 95%.  Examination:  Physical Exam Constitutional:      Appearance: Normal appearance.  HENT:     Head: Normocephalic and atraumatic.     Mouth/Throat:     Mouth: Mucous membranes are moist.  Eyes:     Extraocular Movements: Extraocular movements intact.  Cardiovascular:     Rate and Rhythm: Normal rate and regular rhythm.     Heart sounds: Murmur heard.  Pulmonary:     Effort: Pulmonary effort is normal. No respiratory distress.     Breath sounds:  Normal breath sounds. No wheezing.  Abdominal:     General: Bowel sounds are normal. There is no distension.     Palpations: Abdomen is soft.     Tenderness: There is no abdominal tenderness.  Musculoskeletal:        General: Normal range of motion.     Cervical back: Normal range of motion and neck supple.  Skin:    General: Skin is warm and dry.  Neurological:     General: No focal deficit present.     Mental Status: She is alert.   Psychiatric:        Mood and Affect: Mood normal.      Consultants:  GI  Procedures:    Data Reviewed: Results for orders placed or performed during the hospital encounter of 05/03/23 (from the past 24 hours)  Glucose, capillary     Status: Abnormal   Collection Time: 05/04/23  4:46 PM  Result Value Ref Range   Glucose-Capillary 174 (H) 70 - 99 mg/dL  Glucose, capillary     Status: Abnormal   Collection Time: 05/04/23  7:58 PM  Result Value Ref Range   Glucose-Capillary 139 (H) 70 - 99 mg/dL  Hemoglobin and hematocrit, blood     Status: Abnormal   Collection Time: 05/04/23  8:47 PM  Result Value Ref Range   Hemoglobin 8.4 (L) 12.0 - 15.0 g/dL   HCT 40.9 (L) 81.1 - 91.4 %  Glucose, capillary     Status: Abnormal   Collection Time: 05/04/23 11:53 PM  Result Value Ref Range   Glucose-Capillary 143 (H) 70 - 99 mg/dL  Basic metabolic panel     Status: Abnormal   Collection Time: 05/05/23  4:32 AM  Result Value Ref Range   Sodium 140 135 - 145 mmol/L   Potassium 3.9 3.5 - 5.1 mmol/L   Chloride 111 98 - 111 mmol/L   CO2 19 (L) 22 - 32 mmol/L   Glucose, Bld 182 (H) 70 - 99 mg/dL   BUN 63 (H) 8 - 23 mg/dL   Creatinine, Ser 7.82 (H) 0.44 - 1.00 mg/dL   Calcium 9.1 8.9 - 95.6 mg/dL   GFR, Estimated 24 (L) >60 mL/min   Anion gap 10 5 - 15  Hemoglobin and hematocrit, blood     Status: Abnormal   Collection Time: 05/05/23  4:32 AM  Result Value Ref Range   Hemoglobin 8.0 (L) 12.0 - 15.0 g/dL   HCT 21.3 (L) 08.6 - 57.8 %  Glucose, capillary     Status: Abnormal   Collection Time: 05/05/23  4:36 AM  Result Value Ref Range   Glucose-Capillary 157 (H) 70 - 99 mg/dL  Glucose, capillary     Status: Abnormal   Collection Time: 05/05/23  8:04 AM  Result Value Ref Range   Glucose-Capillary 141 (H) 70 - 99 mg/dL  Blood transfusion report - scanned     Status: None ()   Collection Time: 05/05/23 11:02 AM   Narrative   Ordered by an unspecified provider.  Blood transfusion report  - scanned     Status: None   Collection Time: 05/05/23 11:02 AM   Narrative   Ordered by an unspecified provider.  Glucose, capillary     Status: Abnormal   Collection Time: 05/05/23 12:03 PM  Result Value Ref Range   Glucose-Capillary 165 (H) 70 - 99 mg/dL    I have reviewed pertinent nursing notes, vitals, labs, and images as necessary. I have ordered labwork to  follow up on as indicated.  I have reviewed the last notes from staff over past 24 hours. I have discussed patient's care plan and test results with nursing staff, CM/SW, and other staff as appropriate.  Time spent: Greater than 50% of the 55 minute visit was spent in counseling/coordination of care for the patient as laid out in the A&P.   LOS: 2 days   Lewie Chamber, MD [facility] 05/05/2023, 2:15 PM

## 2023-05-06 ENCOUNTER — Encounter (HOSPITAL_COMMUNITY)
Admission: EM | Disposition: A | Discharge status: Home / Self Care | Payer: Self-pay | Source: Home / Self Care | Attending: Internal Medicine

## 2023-05-06 ENCOUNTER — Encounter (HOSPITAL_COMMUNITY): Payer: Self-pay | Admitting: Family Medicine

## 2023-05-06 ENCOUNTER — Inpatient Hospital Stay (HOSPITAL_COMMUNITY): Admitting: Anesthesiology

## 2023-05-06 DIAGNOSIS — K5730 Diverticulosis of large intestine without perforation or abscess without bleeding: Secondary | ICD-10-CM

## 2023-05-06 DIAGNOSIS — K648 Other hemorrhoids: Secondary | ICD-10-CM

## 2023-05-06 DIAGNOSIS — K6389 Other specified diseases of intestine: Secondary | ICD-10-CM | POA: Diagnosis not present

## 2023-05-06 DIAGNOSIS — K644 Residual hemorrhoidal skin tags: Secondary | ICD-10-CM

## 2023-05-06 DIAGNOSIS — D649 Anemia, unspecified: Secondary | ICD-10-CM | POA: Diagnosis not present

## 2023-05-06 DIAGNOSIS — K921 Melena: Secondary | ICD-10-CM

## 2023-05-06 DIAGNOSIS — N179 Acute kidney failure, unspecified: Secondary | ICD-10-CM | POA: Diagnosis not present

## 2023-05-06 DIAGNOSIS — K2950 Unspecified chronic gastritis without bleeding: Secondary | ICD-10-CM

## 2023-05-06 DIAGNOSIS — N184 Chronic kidney disease, stage 4 (severe): Secondary | ICD-10-CM | POA: Diagnosis not present

## 2023-05-06 LAB — GLUCOSE, CAPILLARY
Glucose-Capillary: 172 mg/dL — ABNORMAL HIGH (ref 70–99)
Glucose-Capillary: 179 mg/dL — ABNORMAL HIGH (ref 70–99)
Glucose-Capillary: 143 mg/dL — ABNORMAL HIGH (ref 70–99)
Glucose-Capillary: 210 mg/dL — ABNORMAL HIGH (ref 70–99)
Glucose-Capillary: 174 mg/dL — ABNORMAL HIGH (ref 70–99)

## 2023-05-06 LAB — CBC WITH DIFFERENTIAL/PLATELET
Abs Immature Granulocytes: 0.05 10*3/uL (ref 0.00–0.07)
Basophils Absolute: 0 10*3/uL (ref 0.0–0.1)
Basophils Relative: 0 %
Eosinophils Absolute: 0.4 10*3/uL (ref 0.0–0.5)
Eosinophils Relative: 7 %
HCT: 29.7 % — ABNORMAL LOW (ref 36.0–46.0)
Hemoglobin: 8.8 g/dL — ABNORMAL LOW (ref 12.0–15.0)
Immature Granulocytes: 1 %
Lymphocytes Relative: 7 %
Lymphs Abs: 0.4 10*3/uL — ABNORMAL LOW (ref 0.7–4.0)
MCH: 27.8 pg (ref 26.0–34.0)
MCHC: 29.6 g/dL — ABNORMAL LOW (ref 30.0–36.0)
MCV: 93.7 fL (ref 80.0–100.0)
Monocytes Absolute: 0.3 10*3/uL (ref 0.1–1.0)
Monocytes Relative: 5 %
Neutro Abs: 5 10*3/uL (ref 1.7–7.7)
Neutrophils Relative %: 80 %
Platelets: 303 10*3/uL (ref 150–400)
RBC: 3.17 MIL/uL — ABNORMAL LOW (ref 3.87–5.11)
RDW: 20.5 % — ABNORMAL HIGH (ref 11.5–15.5)
WBC: 6.2 10*3/uL (ref 4.0–10.5)
nRBC: 0.8 % — ABNORMAL HIGH (ref 0.0–0.2)

## 2023-05-06 LAB — MAGNESIUM: Magnesium: 2.5 mg/dL — ABNORMAL HIGH (ref 1.7–2.4)

## 2023-05-06 LAB — BASIC METABOLIC PANEL
Anion gap: 9 (ref 5–15)
BUN: 43 mg/dL — ABNORMAL HIGH (ref 8–23)
CO2: 18 mmol/L — ABNORMAL LOW (ref 22–32)
Calcium: 9.6 mg/dL (ref 8.9–10.3)
Chloride: 112 mmol/L — ABNORMAL HIGH (ref 98–111)
Creatinine, Ser: 1.69 mg/dL — ABNORMAL HIGH (ref 0.44–1.00)
GFR, Estimated: 29 mL/min — ABNORMAL LOW (ref >60)
Glucose, Bld: 237 mg/dL — ABNORMAL HIGH (ref 70–99)
Potassium: 3.8 mmol/L (ref 3.5–5.1)
Sodium: 139 mmol/L (ref 135–145)

## 2023-05-06 SURGERY — COLONOSCOPY
Anesthesia: Monitor Anesthesia Care

## 2023-05-06 MED ORDER — FERROUS SULFATE 325 (65 FE) MG PO TABS
325.0000 mg | ORAL_TABLET | ORAL | Status: DC
  Administered 2023-05-06 – 2023-05-08 (×2): 325 mg via ORAL
  Filled 2023-05-06 (×2): qty 1

## 2023-05-06 MED ORDER — PROPOFOL 500 MG/50ML IV EMUL
INTRAVENOUS | Status: AC
  Filled 2023-05-06: qty 50

## 2023-05-06 MED ORDER — PHENYLEPHRINE HCL (PRESSORS) 10 MG/ML IV SOLN
INTRAVENOUS | Status: DC | PRN
  Administered 2023-05-06: 80 ug via INTRAVENOUS

## 2023-05-06 MED ORDER — PROPOFOL 10 MG/ML IV BOLUS
INTRAVENOUS | Status: DC | PRN
  Administered 2023-05-06: 50 ug/kg/min via INTRAVENOUS
  Administered 2023-05-06: 20 mg via INTRAVENOUS

## 2023-05-06 MED ORDER — PROPOFOL 10 MG/ML IV BOLUS
INTRAVENOUS | Status: AC
  Filled 2023-05-06: qty 20

## 2023-05-06 MED ORDER — SODIUM CHLORIDE 0.9 % IV SOLN
INTRAVENOUS | Status: AC | PRN
  Administered 2023-05-06: 500 mL via INTRAMUSCULAR

## 2023-05-06 MED ORDER — LIDOCAINE HCL (PF) 2 % IJ SOLN
INTRAMUSCULAR | Status: DC | PRN
  Administered 2023-05-06: 50 mg via INTRADERMAL

## 2023-05-06 MED ORDER — PANTOPRAZOLE SODIUM 40 MG PO TBEC
40.0000 mg | DELAYED_RELEASE_TABLET | Freq: Every day | ORAL | Status: DC
  Administered 2023-05-06 – 2023-05-07 (×2): 40 mg via ORAL
  Filled 2023-05-06 (×2): qty 1

## 2023-05-06 MED ORDER — ENSURE ENLIVE PO LIQD
237.0000 mL | Freq: Two times a day (BID) | ORAL | Status: DC
  Administered 2023-05-07 – 2023-05-08 (×3): 237 mL via ORAL

## 2023-05-06 NOTE — Op Note (Signed)
 [facility] Patient Name: [name] [name] Procedure Date: 05/06/2023 MRN: [id number] Attending MD: Iva Boop , MD, [id number] Date of Birth: 01-27-1933 CSN: [id number] Age: 89 Admit Type: Inpatient Procedure:                Colonoscopy Indications:              Melena Providers:                Iva Boop, MD, Lorenza Evangelist, RN, Beryle Beams, Technician, Hershal Coria CRNA Referring MD:              Medicines:                Monitored Anesthesia Care Complications:            No immediate complications. Estimated Blood Loss:     Estimated blood loss: none. Procedure:                Pre-Anesthesia Assessment:                           - Prior to the procedure, a History and Physical                            was performed, and patient medications and                            allergies were reviewed. The patient's tolerance of                            previous anesthesia was also reviewed. The risks                            and benefits of the procedure and the sedation                            options and risks were discussed with the patient.                            All questions were answered, and informed consent                            was obtained. Prior Anticoagulants: The patient has                            taken no anticoagulant or antiplatelet agents. ASA                            Grade Assessment: IV - A patient with severe                            systemic disease that is a constant threat to life.  After reviewing the risks and benefits, the patient                            was deemed in satisfactory condition to undergo the                            procedure.                           After obtaining informed consent, the colonoscope                            was passed under direct vision. Throughout the                            procedure, the patient's blood  pressure, pulse, and                            oxygen saturations were monitored continuously. The                            CF-HQ190L ([id number]) Olympus colonoscope was                            introduced through the anus and advanced to the the                            terminal ileum, with identification of the                            appendiceal orifice and IC valve. The appendiceal                            orifice and the rectum were photographed. The bowel                            preparation used was Miralax via split dose                            instruction. The patient tolerated the procedure                            well. The quality of the bowel preparation was                            good. The colonoscopy was performed without                            difficulty. Scope In: 12:09:56 PM Scope Out: 12:27:26 PM Scope Withdrawal Time: 0 hours 11 minutes 5 seconds  Total Procedure Duration: 0 hours 17 minutes 30 seconds  Findings:      The perianal and digital rectal examinations were normal.      A few small-mouthed diverticula were found in the sigmoid colon and  descending colon.      External and internal hemorrhoids were found. The hemorrhoids were small.      The terminal ileum appeared normal.      A patchy area of altered vascular mucosa was found in the descending       colon and in the transverse colon.      The exam was otherwise without abnormality on direct and retroflexion       views. Impression:               Limited inspection of very distal terminal ileum -                            not able to photo                           - Diverticulosis in the sigmoid colon and in the                            descending colon.                           - External and internal hemorrhoids.                           - The examined portion of the ileum was normal.                           - Altered vascular mucosa in the descending colon                             and in the transverse colon. A few areas of                            prominent vascularity of unclear significance and                            no stigmata of bleeding - see photo                           - The examination was otherwise normal on direct                            and retroflexion views.                           - No specimens collected. Moderate Sedation:      Not Applicable - Patient had care per Anesthesia. Recommendation:           - Return patient to hospital ward for ongoing care.                           - heart healthy diet                           Oral ferrous sulfate and f/u hematology for  possible parenmteral iron                           have decided that utility of capsule endoscopy is                            low in her case - if there is repeat clear evidence                            of bleeding then would consider but she had a                            negative enteroscopy yestedray so maximum                            endoscopic evaluation possible here has been                            obtained                           son Analese Sovine called and updated                           GI signing off - does not need a GI clinic                            appointmet at dc Procedure Code(s):        --- Professional ---                           [phone], Colonoscopy, flexible; diagnostic, including                            collection of specimen(s) by brushing or washing,                            when performed (separate procedure) Diagnosis Code(s):        --- Professional ---                           K64.8, Other hemorrhoids                           K92.1, Melena (includes Hematochezia)                           K57.30, Diverticulosis of large intestine without                            perforation or abscess without bleeding CPT copyright 2022 [employer]. All rights  reserved. The codes documented in this report are preliminary and upon coder review may  be revised to meet current compliance requirements. Iva Boop, MD 05/06/2023 12:49:48 PM This report has been signed electronically. Number of Addenda: 0

## 2023-05-06 NOTE — H&P (View-Only) (Signed)
 Progress Note   Subjective  Day #4 Chief Complaint: Acute on chronic anemia with melena  05/05/2023 small bowel enteroscopy with normal examined duodenum, chronic gastritis and normal esophagus.  Hemoglobin slightly increased overnight 8.0--> 8.8.  Other labs stable  Today, patient tells me that she is running almost clear, still little discoloration with her last liquidy bowel movement from bowel prep, but she thinks she will be good because "this is not my first rodeo".  Denies any new pain or symptoms.  Aware of plans for colonoscopy today.   Objective   Vital signs in last 24 hours: Temp:  [97.6 F (36.4 C)-98.2 F (36.8 C)] 98 F (36.7 C) (03/18 0356) Pulse Rate:  [92-123] 111 (03/18 0356) Resp:  [18-26] 20 (03/18 0356) BP: (108-175)/(35-58) 137/53 (03/18 0356) SpO2:  [89 %-100 %] 90 % (03/18 0356) Weight:  [66.2 kg-70.1 kg] 70.1 kg (03/17 1719) Last BM Date : 05/01/23 General:  Elderly white female in NAD Heart:  Regular rate and rhythm; no murmurs Lungs: Respirations even and unlabored, lungs CTA bilaterally Abdomen:  Soft, nontender and nondistended. Normal bowel sounds. Psych:  Cooperative. Normal mood and affect.  Intake/Output from previous day: 03/17 0701 - 03/18 0700 In: 508 [P.O.:360; I.V.:148] Out: 1700 [Urine:1200; Stool:500]   Lab Results: Recent Labs    05/03/23 1259 05/04/23 0520 05/04/23 2047 05/05/23 0432 05/06/23 0540  WBC 6.1  --   --   --  6.2  HGB 5.4*   < > 8.4* 8.0* 8.8*  HCT 18.3*   < > 28.5* 26.5* 29.7*  PLT 329  --   --   --  303   < > = values in this interval not displayed.   BMET Recent Labs    05/04/23 0520 05/05/23 0432 05/06/23 0540  NA 137 140 139  K 3.1* 3.9 3.8  CL 108 111 112*  CO2 19* 19* 18*  GLUCOSE 188* 182* 237*  BUN 92* 63* 43*  CREATININE 2.36* 1.97* 1.69*  CALCIUM 8.9 9.1 9.6     Assessment / Plan:   Assessment: 1.  Acute on chronic anemia: Presented with melanotic stools and a hemoglobin of  5.4--> 2 units PRBCs--> 8.1--> 8.0--> 8.8 overnight, no further episodes of melena, enteroscopy 05/05/2023 without etiology, plans for colonoscopy today 2.  Melena: As above with elevated BUN 3.  Chronic heart failure preserved ejection fraction: 02/11/2023 echo with an EF of 70-75% 4.  Acute renal failure on CKD: Baseline creatinine 2.2-2.4, improved today 5.  Severe aortic stenosis 6.  Type 2 diabetes 7.  OSA: On oxygen 8.  Seizure disorder  Plan: 1.  Plans for colonoscopy this afternoon with Dr. Leone Payor.  Did review risks, benefits, limitations and alternatives and the patient agrees to proceed. 2.  Continue to monitor hemoglobin with transfusion as needed less than 7 3.  Continue IV PPI 4.  Please await further recommendations after time of procedure.   Thank you for your kind consultation, we will continue to follow.   LOS: 3 days   Unk Lightning  05/06/2023, 9:50 AM     [facility] Attending   I have taken an interval history, reviewed the chart and examined the patient. I agree with the Advanced Practitioner's note, impression and recommendations with the following additions:  Patient does have harsh 3/6 SEM w/ Ao stenosis.  Ready for colonoscopy today.  Iva Boop, MD, [facility] See Loretha Stapler on call - gastroenterology for best contact person 05/06/2023 11:59  AM

## 2023-05-06 NOTE — Progress Notes (Signed)
   05/06/23 1955  BiPAP/CPAP/SIPAP  BiPAP/CPAP/SIPAP Pt Type Adult  Reason BIPAP/CPAP not in use Non-compliant   Pt refused cpap tonight.  Pt stated she does not use cpap at home and wears oxygen instead.  Pt was advised that RT is available all night should she change her mind.  RN aware.

## 2023-05-06 NOTE — Plan of Care (Signed)
  Problem: Education: Goal: Knowledge of General Education information will improve Description: Including pain rating scale, medication(s)/side effects and non-pharmacologic comfort measures Outcome: Progressing   Problem: Clinical Measurements: Goal: Ability to maintain clinical measurements within normal limits will improve Outcome: Progressing Goal: Respiratory complications will improve Outcome: Progressing Goal: Cardiovascular complication will be avoided Outcome: Progressing   Problem: Activity: Goal: Risk for activity intolerance will decrease Outcome: Progressing   Problem: Nutrition: Goal: Adequate nutrition will be maintained Outcome: Progressing   Problem: Coping: Goal: Level of anxiety will decrease Outcome: Progressing   Problem: Pain Managment: Goal: General experience of comfort will improve and/or be controlled Outcome: Progressing   Problem: Safety: Goal: Ability to remain free from injury will improve Outcome: Progressing

## 2023-05-06 NOTE — Transfer of Care (Signed)
 Immediate Anesthesia Transfer of Care Note  Patient: [name] [name]  Procedure(s) Performed: COLONOSCOPY  Patient Location: Endoscopy Unit  Anesthesia Type:MAC  Level of Consciousness: awake and alert   Airway & Oxygen Therapy: Patient Spontanous Breathing and Patient connected to face mask oxygen  Post-op Assessment: Report given to RN and Post -op Vital signs reviewed and stable  Post vital signs: Reviewed and stable  Last Vitals:  Vitals Value Taken Time  BP 112/31 05/06/23 1233  Temp 36.7 C 05/06/23 1233  Pulse 102 05/06/23 1236  Resp 19 05/06/23 1236  SpO2 97 % 05/06/23 1236  Vitals shown include unfiled device data.  Last Pain:  Vitals:   05/06/23 1233  TempSrc: Temporal  PainSc: 0-No pain      Patients Stated Pain Goal: 3 (05/03/23 1545)  Complications: No notable events documented.

## 2023-05-06 NOTE — Interval H&P Note (Signed)
 History and Physical Interval Note:  05/06/2023 11:59 AM  [name] [name]  has presented today for surgery, with the diagnosis of melena.  The various methods of treatment have been discussed with the patient and family. After consideration of risks, benefits and other options for treatment, the patient has consented to  Procedure(s): COLONOSCOPY (N/A) as a surgical intervention.  The patient's history has been reviewed, patient examined, no change in status, stable for surgery.  I have reviewed the patient's chart and labs.  Questions were answered to the patient's satisfaction.     Stan Head

## 2023-05-06 NOTE — Anesthesia Postprocedure Evaluation (Signed)
 Anesthesia Post Note  Patient: [name] [name]  Procedure(s) Performed: COLONOSCOPY     Patient location during evaluation: PACU Anesthesia Type: MAC Level of consciousness: awake and alert and oriented Pain management: pain level controlled Vital Signs Assessment: post-procedure vital signs reviewed and stable Respiratory status: spontaneous breathing, nonlabored ventilation and respiratory function stable Cardiovascular status: stable and blood pressure returned to baseline Postop Assessment: no apparent nausea or vomiting Anesthetic complications: no   No notable events documented.  Last Vitals:  Vitals:   05/06/23 1300 05/06/23 1310  BP:  (!) 113/92  Pulse: 94 95  Resp: 19 (!) 22  Temp:    SpO2: 98% 97%    Last Pain:  Vitals:   05/06/23 1310  TempSrc:   PainSc: 0-No pain                 Maxyne Derocher A.

## 2023-05-06 NOTE — Anesthesia Preprocedure Evaluation (Addendum)
 Anesthesia Evaluation  Patient identified by MRN, date of birth, ID band Patient awake    Reviewed: Allergy & Precautions, NPO status , Patient's Chart, lab work & pertinent test results  Airway Mallampati: III  TM Distance: >3 FB Neck ROM: Limited    Dental  (+) Dental Advisory Given   Pulmonary shortness of breath and with exertion, sleep apnea , pneumonia, resolved, former smoker   Pulmonary exam normal breath sounds clear to auscultation       Cardiovascular hypertension, Pt. on medications and Pt. on home beta blockers + Valvular Problems/Murmurs (Severe aortic stenosis is present.  Aortic valve mean gradient measures  39.5 mmHg. Aortic valve peak gradient measures 66.3 mmHg. Aortic valve  area, by VTI measures 0.99 cm.) AS  Rhythm:Regular Rate:Normal + Systolic murmurs Echo 02/11/23 1. Left ventricular ejection fraction, by estimation, is 70 to 75%. The  left ventricle has hyperdynamic function. The left ventricle has no  regional wall motion abnormalities. Left ventricular diastolic parameters  are consistent with Grade II diastolic  dysfunction (pseudonormalization). There is the interventricular septum is  flattened in systole, consistent with right ventricular pressure overload.   2. Right ventricular systolic function is mildly reduced. The right  ventricular size is moderately enlarged. Mildly increased right  ventricular wall thickness. There is severely elevated pulmonary artery  systolic pressure. The estimated right  ventricular systolic pressure is 79.9 mmHg.   3. Left atrial size was mildly dilated.   4. Right atrial size was moderately dilated.   5. The mitral valve is normal in structure. Trivial mitral valve  regurgitation. Mild mitral stenosis. Moderate mitral annular  calcification.   6. Tricuspid valve regurgitation is moderate.   7. The aortic valve is tricuspid. There is severe calcifcation of the   aortic valve. There is severe thickening of the aortic valve. Aortic valve  regurgitation is trivial. Severe aortic valve stenosis. Aortic valve mean  gradient measures 39.5 mmHg.  Aortic valve Vmax measures 4.07 m/s. Aortic valve acceleration time  measures 95 msec.   8. The inferior vena cava is dilated in size with >50% respiratory  variability, suggesting right atrial pressure of 8 mmHg.   EKG 05/03/23 NSR, RBBB pattern    Neuro/Psych Seizures -, Well Controlled,  Meniere's disease Diabetic Neuropathy  negative psych ROS   GI/Hepatic Neg liver ROS,GERD  Medicated,,Melena   Endo/Other  diabetes, Well Controlled, Type 2, Oral Hypoglycemic AgentsHypothyroidism  Hyperlipidemia  Renal/GU CRFRenal disease     Musculoskeletal  (+) Arthritis , Osteoarthritis,    Abdominal   Peds  Hematology  (+) Blood dyscrasia, anemia   Anesthesia Other Findings   Reproductive/Obstetrics                              Anesthesia Physical Anesthesia Plan  ASA: 4  Anesthesia Plan: MAC   Post-op Pain Management: Minimal or no pain anticipated   Induction: Intravenous  PONV Risk Score and Plan: 2 and Propofol infusion and Treatment may vary due to age or medical condition  Airway Management Planned: Nasal Cannula and Natural Airway  Additional Equipment: None  Intra-op Plan:   Post-operative Plan:   Informed Consent: I have reviewed the patients History and Physical, chart, labs and discussed the procedure including the risks, benefits and alternatives for the proposed anesthesia with the patient or authorized representative who has indicated his/her understanding and acceptance.   Patient has DNR.  Discussed DNR with patient and Suspend  DNR.   Dental advisory given  Plan Discussed with: CRNA and Anesthesiologist  Anesthesia Plan Comments:          Anesthesia Quick Evaluation

## 2023-05-06 NOTE — Progress Notes (Addendum)
 Progress Note   Subjective  Day #4 Chief Complaint: Acute on chronic anemia with melena  05/05/2023 small bowel enteroscopy with normal examined duodenum, chronic gastritis and normal esophagus.  Hemoglobin slightly increased overnight 8.0--> 8.8.  Other labs stable  Today, patient tells me that she is running almost clear, still little discoloration with her last liquidy bowel movement from bowel prep, but she thinks she will be good because "this is not my first rodeo".  Denies any new pain or symptoms.  Aware of plans for colonoscopy today.   Objective   Vital signs in last 24 hours: Temp:  [97.6 F (36.4 C)-98.2 F (36.8 C)] 98 F (36.7 C) (03/18 0356) Pulse Rate:  [92-123] 111 (03/18 0356) Resp:  [18-26] 20 (03/18 0356) BP: (108-175)/(35-58) 137/53 (03/18 0356) SpO2:  [89 %-100 %] 90 % (03/18 0356) Weight:  [66.2 kg-70.1 kg] 70.1 kg (03/17 1719) Last BM Date : 05/01/23 General:  Elderly white female in NAD Heart:  Regular rate and rhythm; no murmurs Lungs: Respirations even and unlabored, lungs CTA bilaterally Abdomen:  Soft, nontender and nondistended. Normal bowel sounds. Psych:  Cooperative. Normal mood and affect.  Intake/Output from previous day: 03/17 0701 - 03/18 0700 In: 508 [P.O.:360; I.V.:148] Out: 1700 [Urine:1200; Stool:500]   Lab Results: Recent Labs    05/03/23 1259 05/04/23 0520 05/04/23 2047 05/05/23 0432 05/06/23 0540  WBC 6.1  --   --   --  6.2  HGB 5.4*   < > 8.4* 8.0* 8.8*  HCT 18.3*   < > 28.5* 26.5* 29.7*  PLT 329  --   --   --  303   < > = values in this interval not displayed.   BMET Recent Labs    05/04/23 0520 05/05/23 0432 05/06/23 0540  NA 137 140 139  K 3.1* 3.9 3.8  CL 108 111 112*  CO2 19* 19* 18*  GLUCOSE 188* 182* 237*  BUN 92* 63* 43*  CREATININE 2.36* 1.97* 1.69*  CALCIUM 8.9 9.1 9.6     Assessment / Plan:   Assessment: 1.  Acute on chronic anemia: Presented with melanotic stools and a hemoglobin of  5.4--> 2 units PRBCs--> 8.1--> 8.0--> 8.8 overnight, no further episodes of melena, enteroscopy 05/05/2023 without etiology, plans for colonoscopy today 2.  Melena: As above with elevated BUN 3.  Chronic heart failure preserved ejection fraction: 02/11/2023 echo with an EF of 70-75% 4.  Acute renal failure on CKD: Baseline creatinine 2.2-2.4, improved today 5.  Severe aortic stenosis 6.  Type 2 diabetes 7.  OSA: On oxygen 8.  Seizure disorder  Plan: 1.  Plans for colonoscopy this afternoon with Dr. Leone Payor.  Did review risks, benefits, limitations and alternatives and the patient agrees to proceed. 2.  Continue to monitor hemoglobin with transfusion as needed less than 7 3.  Continue IV PPI 4.  Please await further recommendations after time of procedure.   Thank you for your kind consultation, we will continue to follow.   LOS: 3 days   Unk Lightning  05/06/2023, 9:50 AM     [facility] Attending   I have taken an interval history, reviewed the chart and examined the patient. I agree with the Advanced Practitioner's note, impression and recommendations with the following additions:  Patient does have harsh 3/6 SEM w/ Ao stenosis.  Ready for colonoscopy today.  Iva Boop, MD, [facility] See Loretha Stapler on call - gastroenterology for best contact person 05/06/2023 11:59  AM

## 2023-05-06 NOTE — Anesthesia Procedure Notes (Signed)
 Procedure Name: MAC Date/Time: 05/06/2023 12:07 PM  Performed by: Ovidio Kin, CRNAPre-anesthesia Checklist: Patient identified, Emergency Drugs available, Suction available and Patient being monitored Patient Re-evaluated:Patient Re-evaluated prior to induction Oxygen Delivery Method: Simple face mask Induction Type: IV induction Dental Injury: Teeth and Oropharynx as per pre-operative assessment

## 2023-05-06 NOTE — Progress Notes (Signed)
 Progress Note    [name] [name]   [id number]  DOB: [birth year]  DOA: 05/03/2023     3 PCP: Eloisa Northern, MD  Initial CC: Melena  Hospital Course: [name] is an 89 yo female with PMH gastritis, polyps, diverticulosis, hemorrhoids, HTN, DMII, HFpEF, AS, pulm HTN, CKDIV, anemia who presented with melanotic stools and fatigue.  She also endorsed some mild upper abdominal discomfort with associated nausea. Stools have been dark for a few days prior to admission. Initial hemoglobin found to be 5.4 g/dL in the ER. GI was consulted and she was admitted for blood transfusion and further workup.  Interval History:  No events overnight.  Still no further melena. Hgb stable. Undergoing colonoscopy today. She was resting comfortably this morning.   Assessment and Plan: * Symptomatic anemia - Presented with melanotic stools and hemoglobin of 5.4 g/dL - Known history of prior gastritis on EGD.  Not taking NSAIDs or other agents to precipitate upper GIB except for daily aspirin -Received 2 units PRBC on admission - GI following - s/p EGD on 3/17, chronic gastritis noted otherwise unremarkable - s/p colonoscopy 3/18 nothing acute to explain; rec'd for oral iron and possibly outpt heme followup for IV iron - continue protonix - CBC in am  Acute renal failure superimposed on stage 4 chronic kidney disease (HCC) - patient has history of CKD4. Baseline creat ~ 2.2 - 2.4, eGFR~ 20 - patient presents with increase in creat >0.3 mg/dL above baseline or creat increase >1.5x baseline presumed to have occurred within past 7 days PTA - creat 2.77 on admission - s/p IVF and improved  -BMP in a.m.   Chronic heart failure with preserved ejection fraction (HFpEF) (HCC) - No signs/symptoms of exacerbation - Recent echo, 02/11/2023: EF 70 to 75%, no RWMA, grade 2 diastolic dysfunction; severe AS -Lasix on hold for now -Monitor while on fluids  Aortic stenosis - Severe AS noted on recent echo from  02/11/2023 -Follows with cardiology and has been discussing potential TAVR but some concern for progression of CKD; last seen on 03/07/2023 - Further discussions pending TAVR after ongoing nephrology follow-ups  Type 2 diabetes mellitus with diabetic chronic kidney disease (HCC) - A1c 6.6% on 05/04/2023 -Continue SSI and CBG monitoring  OSA (obstructive sleep apnea) - Uses nightly oxygen instead of CPAP  Seizure disorder (HCC) - Continue Keppra  Essential hypertension - Blood pressures soft on admission - Holding antihypertensives, resuming as needed   Old records reviewed in assessment of this patient  Antimicrobials:   DVT prophylaxis:  SCDs Start: 05/03/23 2102   Code Status:   Code Status: Limited: Do not attempt resuscitation (DNR) -DNR-LIMITED -Do Not Intubate/DNI   Mobility Assessment (Last 72 Hours)     Mobility Assessment     Row Name 05/06/23 1100 05/05/23 2013 05/05/23 1719       Does patient have an order for bedrest or is patient medically unstable No - Continue assessment No - Continue assessment No - Continue assessment     What is the highest level of mobility based on the progressive mobility assessment? Level 5 (Walks with assist in room/hall) - Balance while stepping forward/back and can walk in room with assist - Complete Level 5 (Walks with assist in room/hall) - Balance while stepping forward/back and can walk in room with assist - Complete Level 5 (Walks with assist in room/hall) - Balance while stepping forward/back and can walk in room with assist - Complete  Barriers to discharge:  Disposition Plan: Home HH orders placed: N/A Status is: Inpatient  Objective: Blood pressure (!) 113/92, pulse 95, temperature 98 F (36.7 C), temperature source Temporal, resp. rate (!) 22, height 5\' 2"  (1.575 m), weight 70.1 kg, SpO2 97%.  Examination:  Physical Exam Constitutional:      Appearance: Normal appearance.  HENT:     Head:  Normocephalic and atraumatic.     Mouth/Throat:     Mouth: Mucous membranes are moist.  Eyes:     Extraocular Movements: Extraocular movements intact.  Cardiovascular:     Rate and Rhythm: Normal rate and regular rhythm.     Heart sounds: Murmur heard.  Pulmonary:     Effort: Pulmonary effort is normal. No respiratory distress.     Breath sounds: Normal breath sounds. No wheezing.  Abdominal:     General: Bowel sounds are normal. There is no distension.     Palpations: Abdomen is soft.     Tenderness: There is no abdominal tenderness.  Musculoskeletal:        General: Normal range of motion.     Cervical back: Normal range of motion and neck supple.  Skin:    General: Skin is warm and dry.  Neurological:     General: No focal deficit present.     Mental Status: She is alert.  Psychiatric:        Mood and Affect: Mood normal.      Consultants:  GI  Procedures:  3/17: EGD 3/18: Colonoscopy   Data Reviewed: Results for orders placed or performed during the hospital encounter of 05/03/23 (from the past 24 hours)  Glucose, capillary     Status: Abnormal   Collection Time: 05/05/23  4:28 PM  Result Value Ref Range   Glucose-Capillary 146 (H) 70 - 99 mg/dL  Glucose, capillary     Status: Abnormal   Collection Time: 05/05/23  8:10 PM  Result Value Ref Range   Glucose-Capillary 191 (H) 70 - 99 mg/dL  Glucose, capillary     Status: Abnormal   Collection Time: 05/05/23 11:47 PM  Result Value Ref Range   Glucose-Capillary 216 (H) 70 - 99 mg/dL  Glucose, capillary     Status: Abnormal   Collection Time: 05/06/23  3:58 AM  Result Value Ref Range   Glucose-Capillary 172 (H) 70 - 99 mg/dL  Basic metabolic panel     Status: Abnormal   Collection Time: 05/06/23  5:40 AM  Result Value Ref Range   Sodium 139 135 - 145 mmol/L   Potassium 3.8 3.5 - 5.1 mmol/L   Chloride 112 (H) 98 - 111 mmol/L   CO2 18 (L) 22 - 32 mmol/L   Glucose, Bld 237 (H) 70 - 99 mg/dL   BUN 43 (H) 8 - 23  mg/dL   Creatinine, Ser 1.02 (H) 0.44 - 1.00 mg/dL   Calcium 9.6 8.9 - 72.5 mg/dL   GFR, Estimated 29 (L) >60 mL/min   Anion gap 9 5 - 15  CBC with Differential/Platelet     Status: Abnormal   Collection Time: 05/06/23  5:40 AM  Result Value Ref Range   WBC 6.2 4.0 - 10.5 K/uL   RBC 3.17 (L) 3.87 - 5.11 MIL/uL   Hemoglobin 8.8 (L) 12.0 - 15.0 g/dL   HCT 36.6 (L) 44.0 - 34.7 %   MCV 93.7 80.0 - 100.0 fL   MCH 27.8 26.0 - 34.0 pg   MCHC 29.6 (L) 30.0 - 36.0 g/dL  RDW 20.5 (H) 11.5 - 15.5 %   Platelets 303 150 - 400 K/uL   nRBC 0.8 (H) 0.0 - 0.2 %   Neutrophils Relative % 80 %   Neutro Abs 5.0 1.7 - 7.7 K/uL   Lymphocytes Relative 7 %   Lymphs Abs 0.4 (L) 0.7 - 4.0 K/uL   Monocytes Relative 5 %   Monocytes Absolute 0.3 0.1 - 1.0 K/uL   Eosinophils Relative 7 %   Eosinophils Absolute 0.4 0.0 - 0.5 K/uL   Basophils Relative 0 %   Basophils Absolute 0.0 0.0 - 0.1 K/uL   Immature Granulocytes 1 %   Abs Immature Granulocytes 0.05 0.00 - 0.07 K/uL  Magnesium     Status: Abnormal   Collection Time: 05/06/23  5:40 AM  Result Value Ref Range   Magnesium 2.5 (H) 1.7 - 2.4 mg/dL  Glucose, capillary     Status: Abnormal   Collection Time: 05/06/23  7:53 AM  Result Value Ref Range   Glucose-Capillary 179 (H) 70 - 99 mg/dL  Glucose, capillary     Status: Abnormal   Collection Time: 05/06/23 11:26 AM  Result Value Ref Range   Glucose-Capillary 143 (H) 70 - 99 mg/dL    I have reviewed pertinent nursing notes, vitals, labs, and images as necessary. I have ordered labwork to follow up on as indicated.  I have reviewed the last notes from staff over past 24 hours. I have discussed patient's care plan and test results with nursing staff, CM/SW, and other staff as appropriate.  Time spent: Greater than 50% of the 55 minute visit was spent in counseling/coordination of care for the patient as laid out in the A&P.   LOS: 3 days   Lewie Chamber, MD [facility] 05/06/2023, 2:34 PM

## 2023-05-07 DIAGNOSIS — D649 Anemia, unspecified: Secondary | ICD-10-CM | POA: Diagnosis not present

## 2023-05-07 LAB — CBC WITH DIFFERENTIAL/PLATELET
Abs Immature Granulocytes: 0.04 10*3/uL (ref 0.00–0.07)
Basophils Absolute: 0 10*3/uL (ref 0.0–0.1)
Basophils Relative: 0 %
Eosinophils Absolute: 0.3 10*3/uL (ref 0.0–0.5)
Eosinophils Relative: 7 %
HCT: 26.4 % — ABNORMAL LOW (ref 36.0–46.0)
Hemoglobin: 7.6 g/dL — ABNORMAL LOW (ref 12.0–15.0)
Immature Granulocytes: 1 %
Lymphocytes Relative: 10 %
Lymphs Abs: 0.4 10*3/uL — ABNORMAL LOW (ref 0.7–4.0)
MCH: 27.2 pg (ref 26.0–34.0)
MCHC: 28.8 g/dL — ABNORMAL LOW (ref 30.0–36.0)
MCV: 94.6 fL (ref 80.0–100.0)
Monocytes Absolute: 0.2 10*3/uL (ref 0.1–1.0)
Monocytes Relative: 5 %
Neutro Abs: 2.8 10*3/uL (ref 1.7–7.7)
Neutrophils Relative %: 77 %
Platelets: 243 10*3/uL (ref 150–400)
RBC: 2.79 MIL/uL — ABNORMAL LOW (ref 3.87–5.11)
RDW: 19.9 % — ABNORMAL HIGH (ref 11.5–15.5)
WBC: 3.6 10*3/uL — ABNORMAL LOW (ref 4.0–10.5)
nRBC: 0.5 % — ABNORMAL HIGH (ref 0.0–0.2)

## 2023-05-07 LAB — GLUCOSE, CAPILLARY
Glucose-Capillary: 155 mg/dL — ABNORMAL HIGH (ref 70–99)
Glucose-Capillary: 164 mg/dL — ABNORMAL HIGH (ref 70–99)
Glucose-Capillary: 170 mg/dL — ABNORMAL HIGH (ref 70–99)
Glucose-Capillary: 172 mg/dL — ABNORMAL HIGH (ref 70–99)
Glucose-Capillary: 191 mg/dL — ABNORMAL HIGH (ref 70–99)

## 2023-05-07 LAB — BASIC METABOLIC PANEL
Anion gap: 6 (ref 5–15)
BUN: 41 mg/dL — ABNORMAL HIGH (ref 8–23)
CO2: 18 mmol/L — ABNORMAL LOW (ref 22–32)
Calcium: 9.2 mg/dL (ref 8.9–10.3)
Chloride: 114 mmol/L — ABNORMAL HIGH (ref 98–111)
Creatinine, Ser: 1.82 mg/dL — ABNORMAL HIGH (ref 0.44–1.00)
GFR, Estimated: 26 mL/min — ABNORMAL LOW (ref >60)
Glucose, Bld: 162 mg/dL — ABNORMAL HIGH (ref 70–99)
Potassium: 4 mmol/L (ref 3.5–5.1)
Sodium: 138 mmol/L (ref 135–145)

## 2023-05-07 LAB — MAGNESIUM: Magnesium: 2.7 mg/dL — ABNORMAL HIGH (ref 1.7–2.4)

## 2023-05-07 LAB — HEMOGLOBIN AND HEMATOCRIT, BLOOD
HCT: 27.5 % — ABNORMAL LOW (ref 36.0–46.0)
Hemoglobin: 8 g/dL — ABNORMAL LOW (ref 12.0–15.0)

## 2023-05-07 MED ORDER — FUROSEMIDE 40 MG PO TABS
80.0000 mg | ORAL_TABLET | Freq: Every day | ORAL | Status: DC
  Administered 2023-05-07 – 2023-05-08 (×2): 80 mg via ORAL
  Filled 2023-05-07 (×2): qty 2

## 2023-05-07 NOTE — Progress Notes (Signed)
   05/07/23 1948  BiPAP/CPAP/SIPAP  BiPAP/CPAP/SIPAP Pt Type Adult  Reason BIPAP/CPAP not in use Non-compliant   Pt continues to refuse nocturnal cpap.  Pt stated she does not wear cpap at home and does not want to wear cpap while here in the hospital.  Pt prefers to wear nasal cannula instead.  RN aware.

## 2023-05-07 NOTE — Progress Notes (Signed)
 Progress Note    [name] [name]   [id number]  DOB: January 15, 1933  DOA: 05/03/2023     4 PCP: Eloisa Northern, MD  Initial CC: Melena  Hospital Course: Ms. Hoel is an 89 yo female with PMH gastritis, polyps, diverticulosis, hemorrhoids, HTN, DMII, HFpEF, AS, pulm HTN, CKDIV, anemia who presented with melanotic stools and fatigue.  She also endorsed some mild upper abdominal discomfort with associated nausea. Stools have been dark for a few days prior to admission. Initial hemoglobin found to be 5.4 g/dL in the ER.  GI was consulted and she was admitted for blood transfusion and further workup. S/p EGD/colonoscopy.  Interval History:  No further bleeding  Assessment and Plan: * Symptomatic anemia - Presented with melanotic stools and hemoglobin of 5.4 g/dL - Known history of prior gastritis on EGD.  Not taking NSAIDs or other agents to precipitate upper GIB except for daily aspirin -Received 2 units PRBC on admission - GI following - s/p EGD on 3/17, chronic gastritis noted otherwise unremarkable - s/p colonoscopy 3/18 nothing acute to explain; rec'd for oral iron and possibly outpt heme followup for IV iron - continue protonix - repeat CBC this AM  Acute renal failure superimposed on stage 4 chronic kidney disease (HCC) - patient has history of CKD4. Baseline creat ~ 2.2 - 2.4, eGFR~ 20 - patient presents with increase in creat >0.3 mg/dL above baseline or creat increase >1.5x baseline presumed to have occurred within past 7 days PTA - creat 2.77 on admission - s/p IVF and improved   Chronic heart failure with preserved ejection fraction (HFpEF) (HCC) - No signs/symptoms of exacerbation - Recent echo, 02/11/2023: EF 70 to 75%, no RWMA, grade 2 diastolic dysfunction; severe AS -resume lasix  Aortic stenosis - Severe AS noted on recent echo from 02/11/2023 -Follows with cardiology and has been discussing potential TAVR but some concern for progression of CKD; last seen on  03/07/2023 - Further discussions pending TAVR after ongoing nephrology follow-ups  Type 2 diabetes mellitus with diabetic chronic kidney disease (HCC) - A1c 6.6% on 05/04/2023 -Continue SSI and CBG monitoring  OSA (obstructive sleep apnea) - Uses nightly oxygen instead of CPAP  Seizure disorder (HCC) - Continue Keppra  Essential hypertension - Blood pressures soft on admission - Holding antihypertensives, resuming as needed   Old records reviewed in assessment of this patient  Antimicrobials:   DVT prophylaxis:  SCDs Start: 05/03/23 2102   Code Status:   Code Status: Limited: Do not attempt resuscitation (DNR) -DNR-LIMITED -Do Not Intubate/DNI       Disposition Plan: Home HH orders placed: N/A Status is: Inpatient  Objective: Blood pressure (!) 126/49, pulse (!) 110, temperature (!) 97.4 F (36.3 C), temperature source Oral, resp. rate 18, height 5\' 2"  (1.575 m), weight 70.1 kg, SpO2 91%.  Examination:   Consultants:  GI  Procedures:  3/17: EGD 3/18: Colonoscopy   Data Reviewed: Results for orders placed or performed during the hospital encounter of 05/03/23 (from the past 24 hours)  Glucose, capillary     Status: Abnormal   Collection Time: 05/06/23  4:17 PM  Result Value Ref Range   Glucose-Capillary 210 (H) 70 - 99 mg/dL  Glucose, capillary     Status: Abnormal   Collection Time: 05/06/23  8:17 PM  Result Value Ref Range   Glucose-Capillary 174 (H) 70 - 99 mg/dL   Comment 1 Notify RN    Comment 2 Document in Chart   Glucose, capillary  Status: Abnormal   Collection Time: 05/07/23 12:02 AM  Result Value Ref Range   Glucose-Capillary 170 (H) 70 - 99 mg/dL   Comment 1 Notify RN    Comment 2 Document in Chart   Glucose, capillary     Status: Abnormal   Collection Time: 05/07/23  4:15 AM  Result Value Ref Range   Glucose-Capillary 155 (H) 70 - 99 mg/dL   Comment 1 Notify RN    Comment 2 Document in Chart   Basic metabolic panel     Status:  Abnormal   Collection Time: 05/07/23  6:42 AM  Result Value Ref Range   Sodium 138 135 - 145 mmol/L   Potassium 4.0 3.5 - 5.1 mmol/L   Chloride 114 (H) 98 - 111 mmol/L   CO2 18 (L) 22 - 32 mmol/L   Glucose, Bld 162 (H) 70 - 99 mg/dL   BUN 41 (H) 8 - 23 mg/dL   Creatinine, Ser 4.09 (H) 0.44 - 1.00 mg/dL   Calcium 9.2 8.9 - 81.1 mg/dL   GFR, Estimated 26 (L) >60 mL/min   Anion gap 6 5 - 15  CBC with Differential/Platelet     Status: Abnormal   Collection Time: 05/07/23  6:42 AM  Result Value Ref Range   WBC 3.6 (L) 4.0 - 10.5 K/uL   RBC 2.79 (L) 3.87 - 5.11 MIL/uL   Hemoglobin 7.6 (L) 12.0 - 15.0 g/dL   HCT 91.4 (L) 78.2 - 95.6 %   MCV 94.6 80.0 - 100.0 fL   MCH 27.2 26.0 - 34.0 pg   MCHC 28.8 (L) 30.0 - 36.0 g/dL   RDW 21.3 (H) 08.6 - 57.8 %   Platelets 243 150 - 400 K/uL   nRBC 0.5 (H) 0.0 - 0.2 %   Neutrophils Relative % 77 %   Neutro Abs 2.8 1.7 - 7.7 K/uL   Lymphocytes Relative 10 %   Lymphs Abs 0.4 (L) 0.7 - 4.0 K/uL   Monocytes Relative 5 %   Monocytes Absolute 0.2 0.1 - 1.0 K/uL   Eosinophils Relative 7 %   Eosinophils Absolute 0.3 0.0 - 0.5 K/uL   Basophils Relative 0 %   Basophils Absolute 0.0 0.0 - 0.1 K/uL   Immature Granulocytes 1 %   Abs Immature Granulocytes 0.04 0.00 - 0.07 K/uL  Magnesium     Status: Abnormal   Collection Time: 05/07/23  6:42 AM  Result Value Ref Range   Magnesium 2.7 (H) 1.7 - 2.4 mg/dL  Glucose, capillary     Status: Abnormal   Collection Time: 05/07/23  7:14 AM  Result Value Ref Range   Glucose-Capillary 164 (H) 70 - 99 mg/dL      Time spent: Greater than 50% of the 55 minute visit was spent in counseling/coordination of care for the patient as laid out in the A&P.   LOS: 4 days   [name], DO [facility] 05/07/2023, 11:54 AM

## 2023-05-07 NOTE — Progress Notes (Signed)
   05/07/23 0945  Spiritual Encounters  Type of Visit Follow up  Care provided to: Patient  Reason for visit Religious ritual   Per request from [name] [name] through a consult request, I visited to offer support and prayer.  [name] [name] shared with me about her condition and fatigue with the hospital stay. She expressed feeling calm and accepting, looking forward to enjoying the spring season. She draws strength from her community of faith and her beliefs.  I provided compassionate presence and active listening. I affirned her faith and invited further reflection around memories of what she enjoys most (plays piano). I provided [place] prayer at patient's request.  [name] [name], M.Div [phone]

## 2023-05-07 NOTE — Progress Notes (Signed)
 Mobility Specialist - Progress Note   05/07/23 1449  Mobility  Activity Ambulated with assistance in hallway;Transferred to/from [facility]  Level of Assistance Standby assist, set-up cues, supervision of patient - no hands on  Assistive Device Front wheel walker  Distance Ambulated (ft) 250 ft  Activity Response Tolerated well  Mobility Referral Yes  Mobility visit 1 Mobility  Mobility Specialist Start Time (ACUTE ONLY) 1431  Mobility Specialist Stop Time (ACUTE ONLY) 1444  Mobility Specialist Time Calculation (min) (ACUTE ONLY) 13 min   Pt received in recliner and agreeable to mobility. Prior to ambulating, pt requested assistance to [facility]. No complaints during session. Upon returning to room, pt c/o SOB. No other complaints during session. Pt to recliner after session with all needs met.    [facility]

## 2023-05-08 ENCOUNTER — Encounter (HOSPITAL_COMMUNITY): Payer: Self-pay | Admitting: Internal Medicine

## 2023-05-08 DIAGNOSIS — D649 Anemia, unspecified: Secondary | ICD-10-CM | POA: Diagnosis not present

## 2023-05-08 LAB — GLUCOSE, CAPILLARY
Glucose-Capillary: 147 mg/dL — ABNORMAL HIGH (ref 70–99)
Glucose-Capillary: 159 mg/dL — ABNORMAL HIGH (ref 70–99)
Glucose-Capillary: 162 mg/dL — ABNORMAL HIGH (ref 70–99)
Glucose-Capillary: 163 mg/dL — ABNORMAL HIGH (ref 70–99)
Glucose-Capillary: 190 mg/dL — ABNORMAL HIGH (ref 70–99)

## 2023-05-08 LAB — CBC WITH DIFFERENTIAL/PLATELET
Abs Immature Granulocytes: 0.03 10*3/uL (ref 0.00–0.07)
Basophils Absolute: 0 10*3/uL (ref 0.0–0.1)
Basophils Relative: 1 %
Eosinophils Absolute: 0.3 10*3/uL (ref 0.0–0.5)
Eosinophils Relative: 8 %
HCT: 28 % — ABNORMAL LOW (ref 36.0–46.0)
Hemoglobin: 8.2 g/dL — ABNORMAL LOW (ref 12.0–15.0)
Immature Granulocytes: 1 %
Lymphocytes Relative: 12 %
Lymphs Abs: 0.4 10*3/uL — ABNORMAL LOW (ref 0.7–4.0)
MCH: 27.7 pg (ref 26.0–34.0)
MCHC: 29.3 g/dL — ABNORMAL LOW (ref 30.0–36.0)
MCV: 94.6 fL (ref 80.0–100.0)
Monocytes Absolute: 0.2 10*3/uL (ref 0.1–1.0)
Monocytes Relative: 5 %
Neutro Abs: 2.5 10*3/uL (ref 1.7–7.7)
Neutrophils Relative %: 73 %
Platelets: 235 10*3/uL (ref 150–400)
RBC: 2.96 MIL/uL — ABNORMAL LOW (ref 3.87–5.11)
RDW: 19.9 % — ABNORMAL HIGH (ref 11.5–15.5)
WBC: 3.4 10*3/uL — ABNORMAL LOW (ref 4.0–10.5)
nRBC: 0 % (ref 0.0–0.2)

## 2023-05-08 LAB — MAGNESIUM: Magnesium: 2.5 mg/dL — ABNORMAL HIGH (ref 1.7–2.4)

## 2023-05-08 NOTE — TOC Progression Note (Addendum)
 Transition of Care [facility]) - Progression Note    Patient Details  Name: [name] [name] MRN: [id number] Date of Birth: Jul 05, 1932  Transition of Care [facility]) CM/SW Contact  Beckie Busing, RN Phone Number:[phone]  05/08/2023, 11:22 AM  Clinical Narrative:    CM spoke with [place] at [place] who confirms that patient is from AL at [facility]. CM will need to speak with Fleet Contras who covers AL at [facility] [phone])  1129 CM attempted to reach [facility] for AL at [facility].  Message has been left with receptionist because Fleet Contras is "upstairs."  1315 D/c summary and FL2 faxed to [facility] via Hub.         Expected Discharge Plan and Services         Expected Discharge Date: 05/08/23                                     Social Determinants of Health (SDOH) Interventions SDOH Screenings   Food Insecurity: No Food Insecurity (05/03/2023)  Housing: Low Risk  (05/03/2023)  Transportation Needs: No Transportation Needs (05/03/2023)  Utilities: Not At Risk (05/03/2023)  Depression (PHQ2-9): Low Risk  (03/04/2023)  Social Connections: Moderately Integrated (05/03/2023)  Tobacco Use: Medium Risk (05/06/2023)    Readmission Risk Interventions    05/05/2023    3:56 PM 02/17/2023   12:04 PM  Readmission Risk Prevention Plan  Transportation Screening Complete Complete  PCP or Specialist Appt within 5-7 Days Complete Complete  Home Care Screening Complete Complete  Medication Review (RN CM) Complete Complete

## 2023-05-08 NOTE — Discharge Summary (Signed)
 Physician Discharge Summary  [name] [name] WUJ:[id number] DOB: 01-19-1933 DOA: 05/03/2023  PCP: Eloisa Northern, MD  Admit date: 05/03/2023 Discharge date: 05/08/2023  Admitted From: home Discharge disposition: home ([place]?)   Recommendations for Outpatient Follow-Up:   Cbc/bmp 1 week Close follow up with Hematology: s/p colonoscopy 3/18 nothing acute to explain; rec'd for oral iron and possibly outpt heme followup for IV iron   Discharge Diagnosis:   Principal Problem:   Symptomatic anemia Active Problems:   Acute renal failure superimposed on stage 4 chronic kidney disease (HCC)   Chronic heart failure with preserved ejection fraction (HFpEF) (HCC)   Aortic stenosis   Essential hypertension   Seizure disorder (HCC)   OSA (obstructive sleep apnea)   Type 2 diabetes mellitus with diabetic chronic kidney disease (HCC)    Discharge Condition: Improved.  Diet recommendation: Low sodium, heart healthy.  Carbohydrate-modified.  Wound care: None.  Code status: DNR   History of Present Illness:   [name] [name] is a 89 y.o. female with medical history significant for hypertension, type 2 diabetes mellitus, chronic HFpEF, aortic stenosis, pulmonary hypertension, CKD stage IV, and chronic normocytic anemia with baseline hemoglobin in the 7-8 range, now presenting with fatigue and dark stool.   Patient reports mild intermittent upper abdominal discomfort over the past 2 weeks, nausea with eating, and progressive fatigue.  Noted her stool to be dark yesterday.  This morning, the patient's blood pressure to be low.  She denies NSAID use.   ED Course: Upon arrival to the ED, patient is found to be afebrile and saturating 100% on 2 L/min supplemental oxygen with normal heart rate and systolic blood pressure in the upper 80s and 90s.  Labs are most notable for hemoglobin 5.4, BUN 100, creatinine 2.77, and glucose 292.   Gastroenterology was consulted by ED physician and  the patient was treated with a liter of normal saline and acetaminophen.  2 units of packed red blood cells have been ordered for transfusion.   Hospital Course by Problem:    Symptomatic anemia - Presented with melanotic stools and hemoglobin of 5.4 g/dL - Known history of prior gastritis on EGD.  Not taking NSAIDs or other agents to precipitate upper GIB except for daily aspirin -Received 2 units PRBC on admission - GI following - s/p EGD on 3/17, chronic gastritis noted otherwise unremarkable - s/p colonoscopy 3/18 nothing acute to explain; rec'd for oral iron and possibly outpt heme followup for IV iron    Acute renal failure superimposed on stage 4 chronic kidney disease (HCC) - patient has history of CKD4. Baseline creat ~ 2.2 - 2.4, eGFR~ 20 - patient presents with increase in creat >0.3 mg/dL above baseline or creat increase >1.5x baseline presumed to have occurred within past 7 days PTA - creat 2.77 on admission - s/p IVF and improved    Chronic heart failure with preserved ejection fraction (HFpEF) (HCC) - No signs/symptoms of exacerbation - Recent echo, 02/11/2023: EF 70 to 75%, no RWMA, grade 2 diastolic dysfunction; severe AS -resume lasix   Aortic stenosis - Severe AS noted on recent echo from 02/11/2023 -Follows with cardiology and has been discussing potential TAVR but some concern for progression of CKD; last seen on 03/07/2023 - Further discussions pending TAVR after ongoing nephrology follow-ups   Type 2 diabetes mellitus with diabetic chronic kidney disease (HCC) - A1c 6.6% on 05/04/2023 -resume home meds   OSA (obstructive sleep apnea) - Uses nightly oxygen instead  of CPAP   Seizure disorder (HCC) - Continue Keppra   Essential hypertension -outpatient follow up      Medical Consultants:   GI   Discharge Exam:   Vitals:   05/07/23 2045 05/08/23 0439  BP: (!) 154/53 (!) 125/58  Pulse: (!) 110 98  Resp: 18 18  Temp: (!) 97.5 F (36.4 C) 97.8  F (36.6 C)  SpO2: 98% 90%   Vitals:   05/06/23 2015 05/07/23 1335 05/07/23 2045 05/08/23 0439  BP: (!) 126/49 (!) 124/54 (!) 154/53 (!) 125/58  Pulse: (!) 110 (!) 108 (!) 110 98  Resp: 18 16 18 18   Temp: (!) 97.4 F (36.3 C) (!) 97.4 F (36.3 C) (!) 97.5 F (36.4 C) 97.8 F (36.6 C)  TempSrc: Oral Oral Oral Oral  SpO2: 91% 98% 98% 90%  Weight:      Height:        General exam: Appears calm and comfortable.   The results of significant diagnostics from this hospitalization (including imaging, microbiology, ancillary and laboratory) are listed below for reference.     Procedures and Diagnostic Studies:   DG Chest Portable 1 View Result Date: 05/03/2023 CLINICAL DATA:  Shortness of breath, weakness, hypotension. EXAM: PORTABLE CHEST 1 VIEW COMPARISON:  02/11/2023 FINDINGS: Cardiomegaly. Chronic aortic atherosclerotic calcification. The right lung is clear. Mild increased density at the left lung base, improved since the study of December. This could represent residual scarring, but a degree of ongoing volume loss or left base pneumonia is not excluded. No evidence of heart failure or edema. No acute bone finding. IMPRESSION: 1. Cardiomegaly. Chronic aortic atherosclerotic calcification. 2. Mild increased density at the left lung base, improved since the study of December. This could represent residual scarring, but a degree of ongoing volume loss or left base pneumonia is not excluded. Electronically Signed   By: Paulina Fusi M.D.   On: 05/03/2023 13:25     Labs:   Basic Metabolic Panel: Recent Labs  Lab 05/03/23 1259 05/04/23 0520 05/05/23 0432 05/06/23 0540 05/07/23 0642 05/08/23 0606  NA 136 137 140 139 138  --   K 3.6 3.1* 3.9 3.8 4.0  --   CL 105 108 111 112* 114*  --   CO2 21* 19* 19* 18* 18*  --   GLUCOSE 292* 188* 182* 237* 162*  --   BUN 100* 92* 63* 43* 41*  --   CREATININE 2.77* 2.36* 1.97* 1.69* 1.82*  --   CALCIUM 9.0 8.9 9.1 9.6 9.2  --   MG  --  2.8*   --  2.5* 2.7* 2.5*   GFR Estimated Creatinine Clearance: 19.6 mL/min (A) (by C-G formula based on SCr of 1.82 mg/dL (H)). Liver Function Tests: No results for input(s): "AST", "ALT", "ALKPHOS", "BILITOT", "PROT", "ALBUMIN" in the last 168 hours. No results for input(s): "LIPASE", "AMYLASE" in the last 168 hours. No results for input(s): "AMMONIA" in the last 168 hours. Coagulation profile No results for input(s): "INR", "PROTIME" in the last 168 hours.  CBC: Recent Labs  Lab 05/03/23 1259 05/04/23 0520 05/05/23 0432 05/06/23 0540 05/07/23 0642 05/07/23 1126 05/08/23 0606  WBC 6.1  --   --  6.2 3.6*  --  3.4*  NEUTROABS 5.0  --   --  5.0 2.8  --  2.5  HGB 5.4*   < > 8.0* 8.8* 7.6* 8.0* 8.2*  HCT 18.3*   < > 26.5* 29.7* 26.4* 27.5* 28.0*  MCV 91.5  --   --  93.7 94.6  --  94.6  PLT 329  --   --  303 243  --  235   < > = values in this interval not displayed.   Cardiac Enzymes: No results for input(s): "CKTOTAL", "CKMB", "CKMBINDEX", "TROPONINI" in the last 168 hours. BNP: Invalid input(s): "POCBNP" CBG: Recent Labs  Lab 05/07/23 1700 05/07/23 2043 05/08/23 0020 05/08/23 0436 05/08/23 0731  GLUCAP 191* 159* 163* 162* 147*   D-Dimer No results for input(s): "DDIMER" in the last 72 hours. Hgb A1c No results for input(s): "HGBA1C" in the last 72 hours. Lipid Profile No results for input(s): "CHOL", "HDL", "LDLCALC", "TRIG", "CHOLHDL", "LDLDIRECT" in the last 72 hours. Thyroid function studies No results for input(s): "TSH", "T4TOTAL", "T3FREE", "THYROIDAB" in the last 72 hours.  Invalid input(s): "FREET3" Anemia work up No results for input(s): "VITAMINB12", "FOLATE", "FERRITIN", "TIBC", "IRON", "RETICCTPCT" in the last 72 hours. Microbiology Recent Results (from the past 240 hours)  Resp panel by RT-PCR (RSV, Flu A&B, Covid) Anterior Nasal Swab     Status: None   Collection Time: 05/03/23  1:00 PM   Specimen: Anterior Nasal Swab  Result Value Ref Range Status    SARS Coronavirus 2 by RT PCR NEGATIVE NEGATIVE Final    Comment: (NOTE) SARS-CoV-2 target nucleic acids are NOT DETECTED.  The SARS-CoV-2 RNA is generally detectable in upper respiratory specimens during the acute phase of infection. The lowest concentration of SARS-CoV-2 viral copies this assay can detect is 138 copies/mL. A negative result does not preclude SARS-Cov-2 infection and should not be used as the sole basis for treatment or other patient management decisions. A negative result may occur with  improper specimen collection/handling, submission of specimen other than nasopharyngeal swab, presence of viral mutation(s) within the areas targeted by this assay, and inadequate number of viral copies(<138 copies/mL). A negative result must be combined with clinical observations, patient history, and epidemiological information. The expected result is Negative.  Fact Sheet for Patients:  BloggerCourse.com  Fact Sheet for Healthcare Providers:  SeriousBroker.it  This test is no t yet approved or cleared by the [place] FDA and  has been authorized for detection and/or diagnosis of SARS-CoV-2 by FDA under an Emergency Use Authorization (EUA). This EUA will remain  in effect (meaning this test can be used) for the duration of the COVID-19 declaration under Section 564(b)(1) of the Act, 21 U.S.C.section 360bbb-3(b)(1), unless the authorization is terminated  or revoked sooner.       Influenza A by PCR NEGATIVE NEGATIVE Final   Influenza B by PCR NEGATIVE NEGATIVE Final    Comment: (NOTE) The Xpert Xpress SARS-CoV-2/FLU/RSV plus assay is intended as an aid in the diagnosis of influenza from Nasopharyngeal swab specimens and should not be used as a sole basis for treatment. Nasal washings and aspirates are unacceptable for Xpert Xpress SARS-CoV-2/FLU/RSV testing.  Fact Sheet for  Patients: BloggerCourse.com  Fact Sheet for Healthcare Providers: SeriousBroker.it  This test is not yet approved or cleared by the [place] FDA and has been authorized for detection and/or diagnosis of SARS-CoV-2 by FDA under an Emergency Use Authorization (EUA). This EUA will remain in effect (meaning this test can be used) for the duration of the COVID-19 declaration under Section 564(b)(1) of the Act, 21 U.S.C. section 360bbb-3(b)(1), unless the authorization is terminated or revoked.     Resp Syncytial Virus by PCR NEGATIVE NEGATIVE Final    Comment: (NOTE) Fact Sheet for Patients: BloggerCourse.com  Fact Sheet for Healthcare Providers:  SeriousBroker.it  This test is not yet approved or cleared by the [place] and has been authorized for detection and/or diagnosis of SARS-CoV-2 by FDA under an Emergency Use Authorization (EUA). This EUA will remain in effect (meaning this test can be used) for the duration of the COVID-19 declaration under Section 564(b)(1) of the Act, 21 U.S.C. section 360bbb-3(b)(1), unless the authorization is terminated or revoked.  Performed at [facility], [address], [place], [place] [address]   MRSA Next Gen by PCR, Nasal     Status: Abnormal   Collection Time: 05/03/23 10:01 PM   Specimen: Nasal Mucosa; Nasal Swab  Result Value Ref Range Status   MRSA by PCR Next Gen DETECTED (A) NOT DETECTED Final    Comment: CRITICAL RESULT CALLED TO, READ BACK BY AND VERIFIED WITH: Clarisa Kindred, RN 0107 05/04/23 BY Jonnie Finner (NOTE) The GeneXpert MRSA Assay (FDA approved for NASAL specimens only), is one component of a comprehensive MRSA colonization surveillance program. It is not intended to diagnose MRSA infection nor to guide or monitor treatment for MRSA infections. Test performance is not FDA approved in patients less  than 66 years old. Performed at [facility], [address] [place], [place] [address]      Discharge Instructions:   Discharge Instructions     Diet - low sodium heart healthy   Complete by: As directed    Diet Carb Modified   Complete by: As directed    Discharge instructions   Complete by: As directed    Cbc/bmp 1 week   Increase activity slowly   Complete by: As directed       Allergies as of 05/08/2023       Reactions   Ppd [tuberculin Purified Protein Derivative] Other (See Comments)   indurated   Penicillins Other (See Comments)   Unknown         Medication List     PAUSE taking these medications    amLODipine 5 MG tablet Wait to take this until your doctor or other care provider tells you to start again. Commonly known as: NORVASC Take 5 mg by mouth daily.       TAKE these medications    acetaminophen 500 MG tablet Commonly known as: TYLENOL Take 1,000 mg by mouth every 6 (six) hours as needed for moderate pain.   allopurinol 100 MG tablet Commonly known as: ZYLOPRIM Take 1 tablet (100 mg total) by mouth 2 (two) times daily. Take  1 tablet  Daily  to prevent Gout.   ascorbic acid 500 MG tablet Commonly known as: VITAMIN C Take 500 mg by mouth in the morning.   aspirin 81 MG tablet Take 81 mg by mouth in the morning.   cholecalciferol 25 MCG (1000 UNIT) tablet Commonly known as: VITAMIN D3 Take 3,000 Units by mouth every evening.   ferrous sulfate 325 (65 FE) MG tablet Take 1 tablet (325 mg total) by mouth every other day.   fish oil-omega-3 fatty acids 1000 MG capsule Take 1,000 mg by mouth at bedtime.   furosemide 80 MG tablet Commonly known as: LASIX Take 1 tablet (80 mg total) by mouth daily.   Januvia 50 MG tablet Generic drug: sitaGLIPtin Take 50 mg by mouth daily.   lansoprazole 30 MG capsule Commonly known as: PREVACID TAKE 1 CAPSULE BY MOUTH DAILY TO PREVENT HEARTBURN AND INDIGESTION.    levETIRAcetam 500 MG tablet Commonly known as: KEPPRA Take  1 tablet 2  x /day  with Meals  to Prevent Seizures   levothyroxine 112 MCG tablet Commonly known as: SYNTHROID Take 112 mcg by mouth in the morning.   loratadine 10 MG tablet Commonly known as: CLARITIN Take 10 mg by mouth in the morning.   meclizine 25 MG tablet Commonly known as: ANTIVERT Take   1 tablet 2 to 3 x /day  ONLY  if needed for Dizziness /Vertigo   melatonin 3 MG Tabs tablet Take 3 mg by mouth at bedtime.   metoprolol succinate 25 MG 24 hr tablet Commonly known as: TOPROL-XL Take 25 mg by mouth daily.   ondansetron 4 MG tablet Commonly known as: ZOFRAN Take 4 mg by mouth every 6 (six) hours as needed for nausea or vomiting.   rOPINIRole 1 MG tablet Commonly known as: REQUIP TAKE ONE-HALF TO ONE TABLET BY MOUTH THREE TIMES DAILY AS NEEDED FOR RESTLESS LEGS& CRAMPS   vitamin B-12 100 MCG tablet Commonly known as: CYANOCOBALAMIN Take 100 mcg by mouth in the morning.        Follow-up Information     Eloisa Northern, MD Follow up in 1 week(s).   Specialty: Internal Medicine Contact information: [address] [place] [address] [phone]                  Time coordinating discharge: 45 min  Signed:  [name] DO  [facility] 05/08/2023, 9:12 AM

## 2023-05-08 NOTE — NC FL2 (Signed)
 [place] MEDICAID FL2 LEVEL OF CARE FORM     IDENTIFICATION  Patient Name: [name] [name] Birthdate: 1932-03-07 Sex: female Admission Date (Current Location): 05/03/2023  [facility] and [place] Number:  Producer, television/film/video and Address:  [facility],  [address] [place], [place] [address]      Provider Number: [id number]  Attending Physician Name and Address:  [name], DO  Relative Name and Phone Number:  Lawrie Tunks son [phone]    Current Level of Care: Hospital Recommended Level of Care: Assisted Living Facility Prior Approval Number:    Date Approved/Denied:   PASRR Number:    Discharge Plan: Other (Comment) (ALF)    Current Diagnoses: Patient Active Problem List   Diagnosis Date Noted   Acute renal failure superimposed on stage 4 chronic kidney disease (HCC) 05/03/2023   MPN (myeloproliferative neoplasm) (HCC) 02/13/2023   Symptomatic anemia 02/13/2023   Pulmonary hypertension (HCC) 02/12/2023   Acute on chronic respiratory failure with hypoxia (HCC) 02/11/2023   Restless leg syndrome 07/02/2022   Tachycardia 04/09/2022   Paronychia of finger, right 04/02/2022   Respiratory failure with hypoxia (HCC) 04/02/2022   PICC (peripherally inserted central catheter) in place 01/23/2022   Subacute bacterial endocarditis 01/23/2022   Medication management 01/23/2022   Generalized weakness 01/03/2022   Fall at home, initial encounter 01/03/2022   Allergic rhinitis 01/03/2022   Aortic stenosis 01/03/2022   Chronic heart failure with preserved ejection fraction (HFpEF) (HCC) 01/03/2022   Bacteremia due to group B Streptococcus 10/18/2021   Acute respiratory failure with hypoxia (HCC) 10/14/2021   Benign neoplasm of cecum    Benign neoplasm of rectum    Blood in stool    Melena    Acute blood loss anemia    GI bleed 04/04/2020   Anemia of chronic disease 11/02/2019   Aortic valve thickening by Cardiac echo in 2015 11/01/2019   Iron deficiency  anemia 05/10/2019   Secondary hyperparathyroidism of renal origin (HCC) 09/20/2018   Type 2 diabetes mellitus with diabetic chronic kidney disease (HCC) 12/08/2017   ACE inhibitor intolerance 12/08/2017   Vertigo of central origin 11/20/2015   Morbid obesity (HCC) BMI 35+ with T2DM, htn, hyperlipidemia, CKD, OSA 12/12/2014   OSA (obstructive sleep apnea) 08/08/2014   Hyperlipidemia associated with type 2 diabetes mellitus (HCC) 06/27/2013   Vitamin D deficiency 06/27/2013   Seizure disorder (HCC) 11/04/2012   Anemia 06/13/2008   COLONIC POLYPS 06/09/2008   Acquired hypothyroidism 06/09/2008   CKD stage 4 due to type 2 diabetes mellitus (HCC) 06/09/2008   Venous (peripheral) insufficiency 06/09/2008   Gastroesophageal reflux disease 06/09/2008   Essential hypertension 06/09/2008    Orientation RESPIRATION BLADDER Height & Weight     Self, Time, Situation, Place  Normal Continent Weight: 70.1 kg Height:  5\' 2"  (157.5 cm)  BEHAVIORAL SYMPTOMS/MOOD NEUROLOGICAL BOWEL NUTRITION STATUS     (n/a) Continent Diet (Heart Healthy)  AMBULATORY STATUS COMMUNICATION OF NEEDS Skin   Supervision Verbally Normal                       Personal Care Assistance Level of Assistance  Bathing, Feeding, Dressing Bathing Assistance: Limited assistance Feeding assistance: Independent Dressing Assistance: Limited assistance     Functional Limitations Info  Sight, Hearing, Speech Sight Info: Impaired (Eyeglasses) Hearing Info: Impaired Speech Info: Adequate    SPECIAL CARE FACTORS FREQUENCY  Contractures Contractures Info: Not present    Additional Factors Info  Code Status, Allergies, Psychotropic, Insulin Sliding Scale, Isolation Precautions, Suctioning Needs Code Status Info: Full Allergies Info: : Ppd (Tuberculin Purified Protein Derivative), Penicillins Psychotropic Info: n/a see discharge summary Insulin Sliding Scale Info: see discharge  summary Isolation Precautions Info: n/a Suctioning Needs: n/a   Current Medications (05/08/2023):  Discharge Medications: Medication List       PAUSE taking these medications     amLODipine 5 MG tablet Wait to take this until your doctor or other care provider tells you to start again. Commonly known as: NORVASC Take 5 mg by mouth daily.           TAKE these medications     acetaminophen 500 MG tablet Commonly known as: TYLENOL Take 1,000 mg by mouth every 6 (six) hours as needed for moderate pain.    allopurinol 100 MG tablet Commonly known as: ZYLOPRIM Take 1 tablet (100 mg total) by mouth 2 (two) times daily. Take  1 tablet  Daily  to prevent Gout.    ascorbic acid 500 MG tablet Commonly known as: VITAMIN C Take 500 mg by mouth in the morning.    aspirin 81 MG tablet Take 81 mg by mouth in the morning.    cholecalciferol 25 MCG (1000 UNIT) tablet Commonly known as: VITAMIN D3 Take 3,000 Units by mouth every evening.    ferrous sulfate 325 (65 FE) MG tablet Take 1 tablet (325 mg total) by mouth every other day.    fish oil-omega-3 fatty acids 1000 MG capsule Take 1,000 mg by mouth at bedtime.    furosemide 80 MG tablet Commonly known as: LASIX Take 1 tablet (80 mg total) by mouth daily.    Januvia 50 MG tablet Generic drug: sitaGLIPtin Take 50 mg by mouth daily.    lansoprazole 30 MG capsule Commonly known as: PREVACID TAKE 1 CAPSULE BY MOUTH DAILY TO PREVENT HEARTBURN AND INDIGESTION.    levETIRAcetam 500 MG tablet Commonly known as: KEPPRA Take  1 tablet 2  x /day  with Meals  to Prevent Seizures    levothyroxine 112 MCG tablet Commonly known as: SYNTHROID Take 112 mcg by mouth in the morning.    loratadine 10 MG tablet Commonly known as: CLARITIN Take 10 mg by mouth in the morning.    meclizine 25 MG tablet Commonly known as: ANTIVERT Take   1 tablet 2 to 3 x /day  ONLY  if needed for Dizziness /Vertigo    melatonin 3 MG Tabs tablet Take  3 mg by mouth at bedtime.    metoprolol succinate 25 MG 24 hr tablet Commonly known as: TOPROL-XL Take 25 mg by mouth daily.    ondansetron 4 MG tablet Commonly known as: ZOFRAN Take 4 mg by mouth every 6 (six) hours as needed for nausea or vomiting.    rOPINIRole 1 MG tablet Commonly known as: REQUIP TAKE ONE-HALF TO ONE TABLET BY MOUTH THREE TIMES DAILY AS NEEDED FOR RESTLESS LEGS& CRAMPS    vitamin B-12 100 MCG tablet Commonly known as: CYANOCOBALAMIN Take 100 mcg by mouth in the morning.   Please see discharge summary for a list of discharge medications.  Relevant Imaging Results:  Relevant Lab Results:   Additional Information SSN [id number]  Beckie Busing, RN

## 2023-05-08 NOTE — Progress Notes (Signed)
 NT Kim showed me 12pm blood glucose result. The result was 190 and it did not transfer over into the chart. Pt received 1 unit of insulin.

## 2023-05-08 NOTE — TOC Transition Note (Signed)
 Transition of Care [facility]) - Discharge Note   Patient Details  Name: [name] [name] MRN: [id number] Date of Birth: 03/29/1932  Transition of Care [facility]) CM/SW Contact:  Beckie Busing, RN Phone Number:[phone]  05/08/2023, 1:27 PM   Clinical Narrative:    [facility] assisted [facility] has been made aware of 3pm pickup time. This has been confirmed with [facility] [phone]) CM has provided nurse with the number for report. There are no other TOC needs, TOC will sign off.          Patient Goals and CMS Choice            Discharge Placement                       Discharge Plan and Services Additional resources added to the After Visit Summary for                                       Social Drivers of Health (SDOH) Interventions SDOH Screenings   Food Insecurity: No Food Insecurity (05/03/2023)  Housing: Low Risk  (05/03/2023)  Transportation Needs: No Transportation Needs (05/03/2023)  Utilities: Not At Risk (05/03/2023)  Depression (PHQ2-9): Low Risk  (03/04/2023)  Social Connections: Moderately Integrated (05/03/2023)  Tobacco Use: Medium Risk (05/06/2023)     Readmission Risk Interventions    05/05/2023    3:56 PM 02/17/2023   12:04 PM  Readmission Risk Prevention Plan  Transportation Screening Complete Complete  PCP or Specialist Appt within 5-7 Days Complete Complete  Home Care Screening Complete Complete  Medication Review (RN CM) Complete Complete

## 2023-05-08 NOTE — Plan of Care (Signed)
 Gave pt discharge instructions, she verbalized understanding. Pt PIV intact upon removal, Pt getting transported to [facility] assisted living facility. Report called to [phone]  and given to nurse at [facility]. Pt vitals are stable.    Problem: Education: Goal: Knowledge of General Education information will improve Description: Including pain rating scale, medication(s)/side effects and non-pharmacologic comfort measures Outcome: Adequate for Discharge   Problem: Health Behavior/Discharge Planning: Goal: Ability to manage health-related needs will improve Outcome: Adequate for Discharge   Problem: Clinical Measurements: Goal: Ability to maintain clinical measurements within normal limits will improve Outcome: Adequate for Discharge Goal: Will remain free from infection Outcome: Adequate for Discharge Goal: Diagnostic test results will improve Outcome: Adequate for Discharge Goal: Respiratory complications will improve Outcome: Adequate for Discharge Goal: Cardiovascular complication will be avoided Outcome: Adequate for Discharge   Problem: Activity: Goal: Risk for activity intolerance will decrease Outcome: Adequate for Discharge   Problem: Nutrition: Goal: Adequate nutrition will be maintained Outcome: Adequate for Discharge   Problem: Coping: Goal: Level of anxiety will decrease Outcome: Adequate for Discharge   Problem: Elimination: Goal: Will not experience complications related to bowel motility Outcome: Adequate for Discharge Goal: Will not experience complications related to urinary retention Outcome: Adequate for Discharge   Problem: Pain Managment: Goal: General experience of comfort will improve and/or be controlled Outcome: Adequate for Discharge   Problem: Safety: Goal: Ability to remain free from injury will improve Outcome: Adequate for Discharge   Problem: Skin Integrity: Goal: Risk for impaired skin integrity will decrease Outcome: Adequate  for Discharge   Problem: Education: Goal: Ability to describe self-care measures that may prevent or decrease complications (Diabetes Survival Skills Education) will improve Outcome: Adequate for Discharge Goal: Individualized Educational Video(s) Outcome: Adequate for Discharge   Problem: Coping: Goal: Ability to adjust to condition or change in health will improve Outcome: Adequate for Discharge   Problem: Fluid Volume: Goal: Ability to maintain a balanced intake and output will improve Outcome: Adequate for Discharge   Problem: Health Behavior/Discharge Planning: Goal: Ability to identify and utilize available resources and services will improve Outcome: Adequate for Discharge Goal: Ability to manage health-related needs will improve Outcome: Adequate for Discharge   Problem: Metabolic: Goal: Ability to maintain appropriate glucose levels will improve Outcome: Adequate for Discharge   Problem: Nutritional: Goal: Maintenance of adequate nutrition will improve Outcome: Adequate for Discharge Goal: Progress toward achieving an optimal weight will improve Outcome: Adequate for Discharge   Problem: Skin Integrity: Goal: Risk for impaired skin integrity will decrease Outcome: Adequate for Discharge   Problem: Tissue Perfusion: Goal: Adequacy of tissue perfusion will improve Outcome: Adequate for Discharge

## 2023-05-08 NOTE — Care Management Important Message (Signed)
 Important Message  Patient Details IM Letter given. Name: [name] [name] MRN: [id number] Date of Birth: 1932/11/08   Important Message Given:  Yes - Medicare IM     [name] 05/08/2023, 10:39 AM

## 2023-05-09 ENCOUNTER — Encounter (HOSPITAL_COMMUNITY): Payer: Self-pay | Admitting: Internal Medicine

## 2023-06-05 ENCOUNTER — Encounter: Payer: Self-pay | Admitting: Cardiology

## 2023-06-05 ENCOUNTER — Ambulatory Visit: Payer: Medicare Other | Attending: Cardiology | Admitting: Cardiology

## 2023-06-05 VITALS — BP 140/80 | HR 118 | Ht 62.0 in | Wt 158.4 lb

## 2023-06-05 DIAGNOSIS — I272 Pulmonary hypertension, unspecified: Secondary | ICD-10-CM | POA: Diagnosis not present

## 2023-06-05 DIAGNOSIS — E1122 Type 2 diabetes mellitus with diabetic chronic kidney disease: Secondary | ICD-10-CM

## 2023-06-05 DIAGNOSIS — I350 Nonrheumatic aortic (valve) stenosis: Secondary | ICD-10-CM

## 2023-06-05 DIAGNOSIS — I5030 Unspecified diastolic (congestive) heart failure: Secondary | ICD-10-CM

## 2023-06-05 DIAGNOSIS — N184 Chronic kidney disease, stage 4 (severe): Secondary | ICD-10-CM

## 2023-06-05 DIAGNOSIS — G4733 Obstructive sleep apnea (adult) (pediatric): Secondary | ICD-10-CM

## 2023-06-05 NOTE — Patient Instructions (Signed)
 Medication Instructions:  Your physician recommends that you continue on your current medications as directed. Please refer to the Current Medication list given to you today.  *If you need a refill on your cardiac medications before your next appointment, please call your pharmacy*  Follow-Up: At [facility], you and your health needs are our priority.  As part of our continuing mission to provide you with exceptional heart care, our providers are all part of one team.  This team includes your primary Cardiologist (physician) and Advanced Practice Providers or APPs (Physician Assistants and Nurse Practitioners) who all work together to provide you with the care you need, when you need it.  Your next appointment:   6 months  Provider:   Gaylyn Keas, MD       1st Floor: - Lobby - Registration  - Pharmacy  - Lab - Cafe  2nd Floor: - PV Lab - Diagnostic Testing (echo, CT, nuclear med)  3rd Floor: - Vacant  4th Floor: - TCTS (cardiothoracic surgery) - AFib Clinic - Structural Heart Clinic - Vascular Surgery  - Vascular Ultrasound  5th Floor: - HeartCare Cardiology (general and EP) - Clinical Pharmacy for coumadin, hypertension, lipid, weight-loss medications, and med management appointments    Valet parking services will be available as well.

## 2023-06-05 NOTE — Progress Notes (Signed)
 Cardiology Office Note  Date:  06/05/2023   ID:  [name] [name], DOB Feb 25, 1932, MRN [id number] The patient was identified using 2 identifiers.  PCP:  Eloisa Northern, MD  Cardiologist:  Armanda Magic, MD    Referring MD: Eloisa Northern, MD   Chief Complaint  Patient presents with   Congestive Heart Failure   Aortic Stenosis   Sleep Apnea    History of Present Illness:    [name] [name] is a 89 y.o. female with a hx of diastolic CHF, severe AS, pulmonary hypertension, DM, MPN and JAK2, iron deficiency anemia/chronic on iron infusions, CKD stage IV baseline creatinine 2.2, hypothyroidism, seizures, OSA.  She was hospitalized in 01/2023 with CHF.  Discussions of a transaortic valve repair but this was deferred for now given concerns of progression of kidney failure.   The patient resides in an assisted living facility. She uses a condenser for oxygen, which she uses all night, but has concerns about its functioning. She also has a portable oxygen tank for when she goes to meals or therapy.   The patient has a history of sleep apnea but does not use CPAP, instead opting for nightly oxygen use.  She was last seen by Jari Favre, PA today and was doing well.  She is here today for followup and is doing well.  She has chronic DOE and is on O2 at [employer].  She thinks that her breathing is at baseline.  She denies any chest pain or pressure, PND, orthopnea, LE edema, dizziness or syncope. Her heart rate does speed up when she exerts herself.  Occasionally her BP will get low and she will feel dizzy and her ALF will hold her meds. She is compliant with her meds and is tolerating meds with no SE.    Past Medical History:  Diagnosis Date   Anemia, unspecified    Arthritis    "knees; right shoulder" (09/15/2013)   Basal cell carcinoma    "right upper outer lip"   DM neuropathy, type II diabetes mellitus (HCC)    Dyspnea 2021   with low iron   GERD (gastroesophageal reflux disease)    Gout     HCAP (healthcare-associated pneumonia) 01/03/2022   High cholesterol    Hypertension    Hypothyroidism    Meniere's disease    Obesity (BMI 30-39.9)    Other forms of epilepsy and recurrent seizures without mention of intractable epilepsy 11/04/2012   Non convulsive paroxysmal spells, responding to [facility], patient not driving.    Pneumonia ~ 1943   Severe sepsis (HCC) 01/03/2022   Skin cancer    "forehead; right hand"   Stage 4 chronic kidney disease (HCC)    UTI (urinary tract infection)    Vitamin D deficiency     Past Surgical History:  Procedure Laterality Date   BIOPSY  04/05/2020   Procedure: BIOPSY;  Surgeon: Beverley Fiedler, MD;  Location: [facility];  Service: Gastroenterology;;   CERVICAL POLYPECTOMY     COLONOSCOPY N/A 05/06/2023   Procedure: COLONOSCOPY;  Surgeon: Iva Boop, MD;  Location: [facility];  Service: Gastroenterology;  Laterality: N/A;   COLONOSCOPY WITH PROPOFOL N/A 04/06/2020   Procedure: COLONOSCOPY WITH PROPOFOL;  Surgeon: Beverley Fiedler, MD;  Location: [facility];  Service: Gastroenterology;  Laterality: N/A;   ENTEROSCOPY N/A 05/05/2023   Procedure: ENTEROSCOPY;  Surgeon: Iva Boop, MD;  Location: [facility];  Service: Gastroenterology;  Laterality: N/A;   ESOPHAGOGASTRODUODENOSCOPY (EGD) WITH PROPOFOL N/A  04/05/2020   Procedure: ESOPHAGOGASTRODUODENOSCOPY (EGD) WITH PROPOFOL;  Surgeon: Beverley Fiedler, MD;  Location: [facility];  Service: Gastroenterology;  Laterality: N/A;   HAMMER TOE SURGERY Left 1980's   IR FLUORO GUIDE CV LINE RIGHT  01/09/2022   IR PERC TUN PERIT CATH WO PORT S&I /IMAG  01/09/2022   IR REMOVAL TUN CV CATH W/O FL  02/15/2022   IR US GUIDE VASC ACCESS RIGHT  01/09/2022   MOHS SURGERY  [id number]   "right upper outer lip"   POLYPECTOMY  04/06/2020   Procedure: POLYPECTOMY;  Surgeon: Beverley Fiedler, MD;  Location: [facility];  Service: Gastroenterology;;   TONSILLECTOMY  ~ 1947   WRIST SURGERY Left ~ 1950   "ran arm thru  window"    Current Medications: Current Meds  Medication Sig   acetaminophen (TYLENOL) 500 MG tablet Take 1,000 mg by mouth every 6 (six) hours as needed for moderate pain.   allopurinol (ZYLOPRIM) 100 MG tablet Take 1 tablet (100 mg total) by mouth 2 (two) times daily. Take  1 tablet  Daily  to prevent Gout. (Patient taking differently: Take 100 mg by mouth 2 (two) times daily.)   aspirin 81 MG tablet Take 81 mg by mouth in the morning.   cholecalciferol (VITAMIN D3) 25 MCG (1000 UNIT) tablet Take 3,000 Units by mouth every evening.   ferrous sulfate 325 (65 FE) MG tablet Take 1 tablet (325 mg total) by mouth every other day.   fish oil-omega-3 fatty acids 1000 MG capsule Take 1,000 mg by mouth at bedtime.   furosemide (LASIX) 80 MG tablet Take 1 tablet (80 mg total) by mouth daily.   JANUVIA 50 MG tablet Take 50 mg by mouth daily.   lansoprazole (PREVACID) 30 MG capsule TAKE 1 CAPSULE BY MOUTH DAILY TO PREVENT HEARTBURN AND INDIGESTION. (Patient taking differently: Take 30 mg by mouth daily in the afternoon.)   levETIRAcetam (KEPPRA) 500 MG tablet Take  1 tablet 2  x /day  with Meals  to Prevent Seizures (Patient taking differently: Take 500 mg by mouth 2 (two) times daily.)   levothyroxine (SYNTHROID) 112 MCG tablet Take 112 mcg by mouth in the morning.   loratadine (CLARITIN) 10 MG tablet Take 10 mg by mouth in the morning.   meclizine (ANTIVERT) 25 MG tablet Take   1 tablet 2 to 3 x /day  ONLY  if needed for Dizziness /Vertigo (Patient taking differently: Take 25 mg by mouth every 8 (eight) hours as needed (vertigo).)   melatonin 3 MG TABS tablet Take 3 mg by mouth at bedtime.   metoprolol succinate (TOPROL-XL) 25 MG 24 hr tablet Take 25 mg by mouth daily.   ondansetron (ZOFRAN) 4 MG tablet Take 4 mg by mouth every 6 (six) hours as needed for nausea or vomiting.   rOPINIRole (REQUIP) 1 MG tablet TAKE ONE-HALF TO ONE TABLET BY MOUTH THREE TIMES DAILY AS NEEDED FOR RESTLESS LEGS& CRAMPS  (Patient taking differently: Take 0.5 mg by mouth at bedtime.)   vitamin B-12 (CYANOCOBALAMIN) 100 MCG tablet Take 100 mcg by mouth in the morning.   vitamin C (ASCORBIC ACID) 500 MG tablet Take 500 mg by mouth in the morning.     Allergies:   Ppd [tuberculin purified protein derivative] and Penicillins   Social History   Socioeconomic History   Marital status: Widowed    Spouse name: Not on file   Number of children: 3   Years of education: 13   Highest education  level: Not on file  Occupational History   Occupation: retired  Tobacco Use   Smoking status: Former    Current packs/day: 0.00    Average packs/day: 1 pack/day for 30.0 years (30.0 ttl pk-yrs)    Types: Cigarettes    Start date: 06/28/1953    Quit date: 06/29/1983    Years since quitting: 39.9   Smokeless tobacco: Never  Vaping Use   Vaping status: Never Used  Substance and Sexual Activity   Alcohol use: Not Currently   Drug use: No   Sexual activity: Not Currently  Other Topics Concern   Not on file  Social History Narrative   Patient lives at home alone and she is widowed.   Retired.   Education some college.   Right handed.   Caffeine sometimes not daily.    Social Drivers of Corporate investment banker Strain: Not on file  Food Insecurity: No Food Insecurity (05/03/2023)   Hunger Vital Sign    Worried About Running Out of Food in the Last Year: Never true    Ran Out of Food in the Last Year: Never true  Transportation Needs: No Transportation Needs (05/03/2023)   PRAPARE - Administrator, Civil Service (Medical): No    Lack of Transportation (Non-Medical): No  Physical Activity: Not on file  Stress: Not on file  Social Connections: Moderately Integrated (05/03/2023)   Social Connection and Isolation Panel [NHANES]    Frequency of Communication with Friends and Family: More than three times a week    Frequency of Social Gatherings with Friends and Family: Three times a week    Attends  Religious Services: More than 4 times per year    Active Member of Clubs or Organizations: Yes    Attends Banker Meetings: More than 4 times per year    Marital Status: Widowed     Family History: The patient's family history includes Alcohol abuse in her son; Diabetes in her son; Heart disease in her father and mother; Hypertension in her son and son; Ovarian cancer in her sister; Stroke in her mother.  ROS:   Please see the history of present illness.    ROS  All other systems reviewed and negative.   EKGs/Labs/Other Studies Reviewed:    The following studies were reviewed today: EKG Interpretation Date/Time:  Thursday June 05 2023 09:46:49 EDT Ventricular Rate:  95 PR Interval:  176 QRS Duration:  128 QT Interval:  390 QTC Calculation: 490 R Axis:   180  Text Interpretation: Normal sinus rhythm Non-specific intra-ventricular conduction block Right ventricular hypertrophy Possible Lateral infarct , age undetermined T wave abnormality, consider inferior ischemia T wave abnormality, consider anterior ischemia When compared with ECG of 03-May-2023 13:19, PREVIOUS ECG IS PRESENT No significant change since last tracing Confirmed by Armanda Magic ([id number]) on 06/05/2023 10:00:32 AM   Physical Exam:    VS:  BP (!) 140/80   Pulse (!) 118   Ht 5\' 2"  (1.575 m)   Wt 158 lb 6.4 oz (71.8 kg)   SpO2 91%   BMI 28.97 kg/m     Wt Readings from Last 3 Encounters:  06/05/23 158 lb 6.4 oz (71.8 kg)  05/06/23 154 lb 8.7 oz (70.1 kg)  03/07/23 161 lb (73 kg)    GEN: Well nourished, well developed in no acute distress HEENT: Normal NECK: No JVD; bilateral carotid bruits from the heart murmur LYMPHATICS: No lymphadenopathy CARDIAC:regular and tachy, no  rubs, gallops loud  harsh 3/6 SM at RUSB to LUSB and into carotids RESPIRATORY:  Clear to auscultation without rales, wheezing or rhonchi  ABDOMEN: Soft, non-tender, non-distended MUSCULOSKELETAL:  No edema; No deformity  SKIN:  Warm and dry NEUROLOGIC:  Alert and oriented x 3 PSYCHIATRIC:  Normal affect   ASSESSMENT:    1. Heart failure with preserved ejection fraction, unspecified HF chronicity (HCC)   2. CKD stage 4 due to type 2 diabetes mellitus (HCC)   3. Nonrheumatic aortic valve stenosis    PLAN:    In order of problems listed above: Congestive Heart Failure/HFpEF Severe pulmonary hypertension with cor pulmonale -Patient was hospitalized with acute CHF 01/2023  -2D echo at that time showed EF 70 to 75% with G2 DD, mild RV dysfunction, severe pulmonary hypertension, severe aortic stenosis -Discussions of a transaortic valve repair but this was deferred for now given concerns of progression of kidney failure. -Evaluate by advanced heart failure and patient voiced that she wanted a conservative management plan.  Given her renal dysfunction she had no desire to ultimately going on dialysis and therefore palliative care was recommended -She does not appear volume overloaded on exam today -Continue prescription drug management with Lasix 80 mg daily and Toprol XL 25mg  daily with as needed refills -SGLT2i has been avoided due to her underlying CKD and advanced age with increased risk of UTIs -Encourage use of home pulse oximeter to monitor oxygen saturation. -She is followed by nephrology -I have personally reviewed and interpreted outside labs performed by patient's PCP which showed serum creatinine 1.82 and potassium 4 on 05/07/2023   CKD D stage 4 -recent creatinine 2.0 in December 2024 -She follows with nephrology -continue current diuretic regimen   Aortic stenosis -Severe aortic stenosis.  -This was noted on echo 01/2023 -TAVR workup was deferred because of CKD -Continue conservative management with medications. -Patient again wants conservative management with no plans for TAVR evaluation   Sleep Apnea -She does not use CPAP>>she did not tolerate it  and wanted to just use the  oxygen  concentrator at night. -Ensure concentrator is functioning properly for optimal management of sleep apnea.  Followup with me in 6 months    Time Spent: 20 minutes total time of encounter, including 15 minutes spent in face-to-face patient care on the date of this encounter. This time includes coordination of care and counseling regarding above mentioned problem list. Remainder of non-face-to-face time involved reviewing chart documents/testing relevant to the patient encounter and documentation in the medical record. I have independently reviewed documentation from referring provider  Medication Adjustments/Labs and Tests Ordered: Current medicines are reviewed at length with the patient today.  Concerns regarding medicines are outlined above.  No orders of the defined types were placed in this encounter.  No orders of the defined types were placed in this encounter.   Signed, Gaylyn Keas, MD  06/05/2023 9:40 AM    [facility]

## 2023-06-10 ENCOUNTER — Other Ambulatory Visit: Payer: Self-pay

## 2023-06-10 DIAGNOSIS — D471 Chronic myeloproliferative disease: Secondary | ICD-10-CM

## 2023-06-11 ENCOUNTER — Inpatient Hospital Stay: Payer: Medicare Other | Admitting: Hematology

## 2023-06-11 ENCOUNTER — Other Ambulatory Visit: Payer: Self-pay

## 2023-06-11 ENCOUNTER — Inpatient Hospital Stay: Payer: Medicare Other | Attending: Hematology

## 2023-06-11 VITALS — BP 131/56 | HR 94 | Temp 97.6°F | Resp 17 | Wt 157.8 lb

## 2023-06-11 DIAGNOSIS — N189 Chronic kidney disease, unspecified: Secondary | ICD-10-CM | POA: Diagnosis not present

## 2023-06-11 DIAGNOSIS — R5383 Other fatigue: Secondary | ICD-10-CM | POA: Diagnosis not present

## 2023-06-11 DIAGNOSIS — D471 Chronic myeloproliferative disease: Secondary | ICD-10-CM | POA: Diagnosis not present

## 2023-06-11 DIAGNOSIS — D631 Anemia in chronic kidney disease: Secondary | ICD-10-CM | POA: Diagnosis not present

## 2023-06-11 DIAGNOSIS — K921 Melena: Secondary | ICD-10-CM | POA: Diagnosis not present

## 2023-06-11 DIAGNOSIS — N184 Chronic kidney disease, stage 4 (severe): Secondary | ICD-10-CM | POA: Diagnosis not present

## 2023-06-11 DIAGNOSIS — D649 Anemia, unspecified: Secondary | ICD-10-CM | POA: Diagnosis not present

## 2023-06-11 DIAGNOSIS — C946 Myelodysplastic disease, not classified: Secondary | ICD-10-CM | POA: Diagnosis present

## 2023-06-11 LAB — CMP (CANCER CENTER ONLY)
ALT: 21 U/L (ref 0–44)
AST: 24 U/L (ref 15–41)
Albumin: 4.6 g/dL (ref 3.5–5.0)
Alkaline Phosphatase: 63 U/L (ref 38–126)
Anion gap: 7 (ref 5–15)
BUN: 56 mg/dL — ABNORMAL HIGH (ref 8–23)
CO2: 28 mmol/L (ref 22–32)
Calcium: 10 mg/dL (ref 8.9–10.3)
Chloride: 106 mmol/L (ref 98–111)
Creatinine: 2.12 mg/dL — ABNORMAL HIGH (ref 0.44–1.00)
GFR, Estimated: 22 mL/min — ABNORMAL LOW (ref >60)
Glucose, Bld: 176 mg/dL — ABNORMAL HIGH (ref 70–99)
Potassium: 4.3 mmol/L (ref 3.5–5.1)
Sodium: 141 mmol/L (ref 135–145)
Total Bilirubin: 0.6 mg/dL (ref 0.0–1.2)
Total Protein: 6.7 g/dL (ref 6.5–8.1)

## 2023-06-11 LAB — CBC WITH DIFFERENTIAL (CANCER CENTER ONLY)
Abs Immature Granulocytes: 0.03 10*3/uL (ref 0.00–0.07)
Basophils Absolute: 0 10*3/uL (ref 0.0–0.1)
Basophils Relative: 1 %
Eosinophils Absolute: 0.8 10*3/uL — ABNORMAL HIGH (ref 0.0–0.5)
Eosinophils Relative: 12 %
HCT: 41.1 % (ref 36.0–46.0)
Hemoglobin: 12 g/dL (ref 12.0–15.0)
Immature Granulocytes: 1 %
Lymphocytes Relative: 6 %
Lymphs Abs: 0.4 10*3/uL — ABNORMAL LOW (ref 0.7–4.0)
MCH: 25.6 pg — ABNORMAL LOW (ref 26.0–34.0)
MCHC: 29.2 g/dL — ABNORMAL LOW (ref 30.0–36.0)
MCV: 87.6 fL (ref 80.0–100.0)
Monocytes Absolute: 0.3 10*3/uL (ref 0.1–1.0)
Monocytes Relative: 5 %
Neutro Abs: 5 10*3/uL (ref 1.7–7.7)
Neutrophils Relative %: 75 %
Platelet Count: 339 10*3/uL (ref 150–400)
RBC: 4.69 MIL/uL (ref 3.87–5.11)
RDW: 19.1 % — ABNORMAL HIGH (ref 11.5–15.5)
WBC Count: 6.6 10*3/uL (ref 4.0–10.5)
nRBC: 0 % (ref 0.0–0.2)

## 2023-06-11 LAB — IRON AND IRON BINDING CAPACITY (CC-WL,HP ONLY)
Iron: 277 ug/dL — ABNORMAL HIGH (ref 28–170)
Saturation Ratios: 94 % — ABNORMAL HIGH (ref 10.4–31.8)
TIBC: 294 ug/dL (ref 250–450)
UIBC: 17 ug/dL — ABNORMAL LOW (ref 148–442)

## 2023-06-11 LAB — RETICULOCYTES
Immature Retic Fract: 16.1 % — ABNORMAL HIGH (ref 2.3–15.9)
RBC.: 4.72 MIL/uL (ref 3.87–5.11)
Retic Count, Absolute: 81.7 10*3/uL (ref 19.0–186.0)
Retic Ct Pct: 1.7 % (ref 0.4–3.1)

## 2023-06-11 LAB — FERRITIN: Ferritin: 48 ng/mL (ref 11–307)

## 2023-06-11 LAB — LACTATE DEHYDROGENASE: LDH: 439 U/L — ABNORMAL HIGH (ref 98–192)

## 2023-06-11 NOTE — Progress Notes (Signed)
 HEMATOLOGY/ONCOLOGY CLINIC NOTE  Date of Service: 06/11/23   Patient Care Team: Tita Form, MD as PCP - General (Internal Medicine) Jacqueline Matsu, MD as PCP - Cardiology (Cardiology) Bensimhon, Rheta Celestine, MD as PCP - Advanced Heart Failure (Cardiology) Nan Aver, MD as Consulting Physician (Internal Medicine) Alonza Arthurs, [facility] (Inactive) as Pharmacist (Pharmacist)  CHIEF COMPLAINTS/PURPOSE OF CONSULTATION:  Follow-up for JAK2 positive myeloproliferative neoplasm  HISTORY OF PRESENTING ILLNESS:  Please see previous note for details on initial presentation  INTERVAL HISTORY:   [name] [name] is a 89 y.o. female here for follow-up of her JAK2 positive myeloproliferative neoplasm.    Patient was last seen by me in clinic on 01/28/2023 and complained of burning sensation when urinating, fatigue/lethargy, mild congestion at night due to her heating system, and mild right lower abdominal pain.   Patient presents with a walker during today's visit.   She presented to the ED on 05/03/2023 for upper GI bleeding with dark stools and fatigue. 2 units of PRBCs were ordered for transfusion.   Her baby aspirin  was continued and she denies any bleeding while she is on it. She denies any issues with nose or gum bleeds. Patient reports previously having hemorrhoidal bleeding, but denies any recent issues with this.   She reports that she believes that she may have overexerted herself during physical therapy and notes that she felt  herself go "down-hill" since. Patient endorsed black stools the morning after physical therapy. Her hgb was noted to be as low as 5.4 on 05/03/2023 with GI bleeding.   She had a colonoscopy and endoscopy by Dr. Willy Harvest, which showed no active bleeding. Patient reports that there is no plan for capsule endoscopy at this time.   Patient reports an incident in which she was unable to walk one morning due to weakness.   She reports that she was told by her  cardiologist that to fix her heart valve, there may be a role for dialysis. She reports that she is not inclined to consider dialysis if needed.   Patient reports having an upcoming appointment with nephrologist, Dr. Shaune Delaine in June.   She reports that her urine is generally clear/yellow. Patient denies any obvious red/brown urine discoloration.   Patient complains of bruising only in her left upper extremity. She denies any ecsema specifically in the area of her left upper extremity, though she reports she may have skin rashes present in other areas.   She denies any abdominal pain.   Patient reports gout flares sometimes in her fingers. Her symptoms began in her left pointer finger then her right pointer finger.   She regularly follows with her PCP, Tita Form, MD at her assisted living facility.   MEDICAL HISTORY:  Past Medical History:  Diagnosis Date   Anemia, unspecified    Arthritis    "knees; right shoulder" (09/15/2013)   Basal cell carcinoma    "right upper outer lip"   DM neuropathy, type II diabetes mellitus (HCC)    Dyspnea 2021   with low iron    GERD (gastroesophageal reflux disease)    Gout    HCAP (healthcare-associated pneumonia) 01/03/2022   High cholesterol    Hypertension    Hypothyroidism    Meniere's disease    Obesity (BMI 30-39.9)    Other forms of epilepsy and recurrent seizures without mention of intractable epilepsy 11/04/2012   Non convulsive paroxysmal spells, responding to KEPPRA , patient not driving.    Pneumonia ~ 1943  Severe sepsis (HCC) 01/03/2022   Skin cancer    "forehead; right hand"   Stage 4 chronic kidney disease (HCC)    UTI (urinary tract infection)    Vitamin D  deficiency     SURGICAL HISTORY: Past Surgical History:  Procedure Laterality Date   BIOPSY  04/05/2020   Procedure: BIOPSY;  Surgeon: Nannette Babe, MD;  Location: [facility];  Service: Gastroenterology;;   CERVICAL POLYPECTOMY     COLONOSCOPY N/A 05/06/2023    Procedure: COLONOSCOPY;  Surgeon: Kenney Peacemaker, MD;  Location: [facility];  Service: Gastroenterology;  Laterality: N/A;   COLONOSCOPY WITH PROPOFOL  N/A 04/06/2020   Procedure: COLONOSCOPY WITH PROPOFOL ;  Surgeon: Nannette Babe, MD;  Location: [facility];  Service: Gastroenterology;  Laterality: N/A;   ENTEROSCOPY N/A 05/05/2023   Procedure: ENTEROSCOPY;  Surgeon: Kenney Peacemaker, MD;  Location: [facility];  Service: Gastroenterology;  Laterality: N/A;   ESOPHAGOGASTRODUODENOSCOPY (EGD) WITH PROPOFOL  N/A 04/05/2020   Procedure: ESOPHAGOGASTRODUODENOSCOPY (EGD) WITH PROPOFOL ;  Surgeon: Nannette Babe, MD;  Location: [facility];  Service: Gastroenterology;  Laterality: N/A;   HAMMER TOE SURGERY Left 1980's   IR FLUORO GUIDE CV LINE RIGHT  01/09/2022   IR PERC TUN PERIT CATH WO PORT S&I /IMAG  01/09/2022   IR REMOVAL TUN CV CATH W/O FL  02/15/2022   IR US  GUIDE VASC ACCESS RIGHT  01/09/2022   MOHS SURGERY  20087   "right upper outer lip"   POLYPECTOMY  04/06/2020   Procedure: POLYPECTOMY;  Surgeon: Nannette Babe, MD;  Location: [facility];  Service: Gastroenterology;;   TONSILLECTOMY  ~ 1947   WRIST SURGERY Left ~ 1950   "ran arm thru window"    SOCIAL HISTORY: Social History   Socioeconomic History   Marital status: Widowed    Spouse name: Not on file   Number of children: 3   Years of education: 90   Highest education level: Not on file  Occupational History   Occupation: retired  Tobacco Use   Smoking status: Former    Current packs/day: 0.00    Average packs/day: 1 pack/day for 30.0 years (30.0 ttl pk-yrs)    Types: Cigarettes    Start date: 06/28/1953    Quit date: 06/29/1983    Years since quitting: 39.9   Smokeless tobacco: Never  Vaping Use   Vaping status: Never Used  Substance and Sexual Activity   Alcohol use: Not Currently   Drug use: No   Sexual activity: Not Currently  Other Topics Concern   Not on file  Social History Narrative   Patient lives at  home alone and she is widowed.   Retired.   Education some college.   Right handed.   Caffeine sometimes not daily.    Social Drivers of Corporate investment banker Strain: Not on file  Food Insecurity: No Food Insecurity (05/03/2023)   Hunger Vital Sign    Worried About Running Out of Food in the Last Year: Never true    Ran Out of Food in the Last Year: Never true  Transportation Needs: No Transportation Needs (05/03/2023)   PRAPARE - Administrator, Civil Service (Medical): No    Lack of Transportation (Non-Medical): No  Physical Activity: Not on file  Stress: Not on file  Social Connections: Moderately Integrated (05/03/2023)   Social Connection and Isolation Panel [NHANES]    Frequency of Communication with Friends and Family: More than three times a week    Frequency  of Social Gatherings with Friends and Family: Three times a week    Attends Religious Services: More than 4 times per year    Active Member of Clubs or Organizations: Yes    Attends Banker Meetings: More than 4 times per year    Marital Status: Widowed  Intimate Partner Violence: Not At Risk (05/03/2023)   Humiliation, Afraid, Rape, and Kick questionnaire    Fear of Current or Ex-Partner: No    Emotionally Abused: No    Physically Abused: No    Sexually Abused: No    FAMILY HISTORY: Family History  Problem Relation Age of Onset   Heart disease Mother    Stroke Mother    Heart disease Father    Ovarian cancer Sister    Hypertension Son    Hypertension Son    Diabetes Son    Alcohol abuse Son     ALLERGIES:  is allergic to ppd [tuberculin purified protein derivative] and penicillins.  MEDICATIONS:  Current Outpatient Medications  Medication Sig Dispense Refill   acetaminophen  (TYLENOL ) 500 MG tablet Take 1,000 mg by mouth every 6 (six) hours as needed for moderate pain.     allopurinol  (ZYLOPRIM ) 100 MG tablet Take 1 tablet (100 mg total) by mouth 2 (two) times daily. Take  1  tablet  Daily  to prevent Gout. (Patient taking differently: Take 100 mg by mouth 2 (two) times daily.) 180 tablet 2   [Paused] amLODipine (NORVASC) 5 MG tablet Take 5 mg by mouth daily. (Patient not taking: Reported on 06/05/2023)     aspirin  81 MG tablet Take 81 mg by mouth in the morning.     cholecalciferol  (VITAMIN D3) 25 MCG (1000 UNIT) tablet Take 3,000 Units by mouth every evening.     ferrous sulfate  325 (65 FE) MG tablet Take 1 tablet (325 mg total) by mouth every other day.     fish oil-omega-3 fatty acids  1000 MG capsule Take 1,000 mg by mouth at bedtime.     furosemide  (LASIX ) 80 MG tablet Take 1 tablet (80 mg total) by mouth daily. 30 tablet 0   JANUVIA  50 MG tablet Take 50 mg by mouth daily.     lansoprazole  (PREVACID ) 30 MG capsule TAKE 1 CAPSULE BY MOUTH DAILY TO PREVENT HEARTBURN AND INDIGESTION. (Patient taking differently: Take 30 mg by mouth daily in the afternoon.) 90 capsule 3   levETIRAcetam  (KEPPRA ) 500 MG tablet Take  1 tablet 2  x /day  with Meals  to Prevent Seizures (Patient taking differently: Take 500 mg by mouth 2 (two) times daily.) 180 tablet 3   levothyroxine  (SYNTHROID ) 112 MCG tablet Take 112 mcg by mouth in the morning.     loratadine  (CLARITIN ) 10 MG tablet Take 10 mg by mouth in the morning.     meclizine  (ANTIVERT ) 25 MG tablet Take   1 tablet 2 to 3 x /day  ONLY  if needed for Dizziness /Vertigo (Patient taking differently: Take 25 mg by mouth every 8 (eight) hours as needed (vertigo).) 90 tablet 1   melatonin 3 MG TABS tablet Take 3 mg by mouth at bedtime.     metoprolol  succinate (TOPROL -XL) 25 MG 24 hr tablet Take 25 mg by mouth daily.     ondansetron  (ZOFRAN ) 4 MG tablet Take 4 mg by mouth every 6 (six) hours as needed for nausea or vomiting.     rOPINIRole  (REQUIP ) 1 MG tablet TAKE ONE-HALF TO ONE TABLET BY MOUTH THREE TIMES DAILY AS  NEEDED FOR RESTLESS LEGS& CRAMPS (Patient taking differently: Take 0.5 mg by mouth at bedtime.) 270 tablet 3   vitamin  B-12 (CYANOCOBALAMIN ) 100 MCG tablet Take 100 mcg by mouth in the morning.     vitamin C  (ASCORBIC ACID ) 500 MG tablet Take 500 mg by mouth in the morning.     No current facility-administered medications for this visit.    REVIEW OF SYSTEMS:    10 Point review of Systems was done is negative except as noted above.   PHYSICAL EXAMINATION: ECOG PERFORMANCE STATUS: 2 - Symptomatic, <50% confined to bed  Vitals:   06/11/23 1007  BP: (!) 131/56  Pulse: 94  Resp: 17  Temp: 97.6 F (36.4 C)  SpO2: 95%   .Body mass index is 28.86 kg/m.  GENERAL:alert, in no acute distress and comfortable SKIN: no acute rashes, no significant lesions EYES: conjunctiva are pink and non-injected, sclera anicteric OROPHARYNX: MMM, no exudates, no oropharyngeal erythema or ulceration NECK: supple, no JVD LYMPH:  no palpable lymphadenopathy in the cervical, axillary or inguinal regions LUNGS: clear to auscultation b/l with normal respiratory effort HEART: regular rate & rhythm ABDOMEN:  normoactive bowel sounds , non tender, not distended. Extremity: no pedal edema PSYCH: alert & oriented x 3 with fluent speech NEURO: no focal motor/sensory deficits   LABORATORY DATA:  I have reviewed the data as listed  .    Latest Ref Rng & Units 06/11/2023    9:47 AM 05/08/2023    6:06 AM 05/07/2023   11:26 AM  CBC  WBC 4.0 - 10.5 K/uL 6.6  3.4    Hemoglobin 12.0 - 15.0 g/dL 16.1  8.2  8.0   Hematocrit 36.0 - 46.0 % 41.1  28.0  27.5   Platelets 150 - 400 K/uL 339  235     . CBC    Component Value Date/Time   WBC 6.6 06/11/2023 0947   WBC 3.4 (L) 05/08/2023 0606   RBC 4.69 06/11/2023 0947   RBC 4.72 06/11/2023 0947   HGB 12.0 06/11/2023 0947   HGB 11.5 (L) 10/05/2012 1111   HCT 41.1 06/11/2023 0947   HCT 34.2 (L) 10/05/2012 1111   PLT 339 06/11/2023 0947   PLT 258 10/05/2012 1111   MCV 87.6 06/11/2023 0947   MCV 90.1 10/05/2012 1111   MCH 25.6 (L) 06/11/2023 0947   MCHC 29.2 (L) 06/11/2023 0947    RDW 19.1 (H) 06/11/2023 0947   RDW 14.2 10/05/2012 1111   LYMPHSABS 0.4 (L) 06/11/2023 0947   LYMPHSABS 1.4 10/05/2012 1111   MONOABS 0.3 06/11/2023 0947   MONOABS 0.5 10/05/2012 1111   EOSABS 0.8 (H) 06/11/2023 0947   EOSABS 0.6 (H) 10/05/2012 1111   BASOSABS 0.0 06/11/2023 0947   BASOSABS 0.0 10/05/2012 1111    .    Latest Ref Rng & Units 06/11/2023    9:47 AM 05/07/2023    6:42 AM 05/06/2023    5:40 AM  CMP  Glucose 70 - 99 mg/dL 096  045  409   BUN 8 - 23 mg/dL 56  41  43   Creatinine 0.44 - 1.00 mg/dL 8.11  9.14  7.82   Sodium 135 - 145 mmol/L 141  138  139   Potassium 3.5 - 5.1 mmol/L 4.3  4.0  3.8   Chloride 98 - 111 mmol/L 106  114  112   CO2 22 - 32 mmol/L 28  18  18    Calcium  8.9 - 10.3 mg/dL 95.6  9.2  9.6   Total Protein 6.5 - 8.1 g/dL 6.7     Total Bilirubin 0.0 - 1.2 mg/dL 0.6     Alkaline Phos 38 - 126 U/L 63     AST 15 - 41 U/L 24     ALT 0 - 44 U/L 21      . Lab Results  Component Value Date   IRON  277 (H) 06/11/2023   TIBC 294 06/11/2023   IRONPCTSAT 94 (H) 06/11/2023   (Iron  and TIBC)  Lab Results  Component Value Date   FERRITIN 48 06/11/2023   11/26/2019   08/10/2019 Iliac Crest Bone Marrow Report [phone]):   08/10/2019 Flow Pathology [phone]):   08/10/2019 Cytogenetics:    RADIOGRAPHIC STUDIES: I have personally reviewed the radiological images as listed and agreed with the findings in the report. No results found.   ASSESSMENT & PLAN:   89 yo female with   1) Normocytic Anemia  Likely related to CKD and functional iron  deficiency  2) JAK2 positive MPN - likely ET -- platelets have starting increasing recently.  3) BM Bx- consistent with MPN NOS  PLAN:  -Discussed lab results on 06/11/23 in detail with patient. CBC showed WBC of 6.6K, hemoglobin of 12.0, and platelets of 339K. -hemoglobin improved from 8 one month ago to 12 currently -iron  labs pending will review results to determine if there is any  need for adiditonal IV iron  -discussed that with CKD, there is a need to keep iron  levels higher than normal so that the body is able to use it -discussed that there are multiple factors that can contribute to anemia, including CKD, iron  deficiency, and JAK2 positive myeloproliferative neoplasm -educated patient that a tight aortic valve can cause mechanical breakage of RBCs -educated patient that there can also be mechanical injury to some of the blood clotting proteins, which can cause acquired Von Willebrand disease and can increase the risk of bleeding -discussed that there can be a tendency for gout to present in the joints that have osteoarthritis -educated patient that bruising may be due to Aspirin  use or Senile purpura in which there is thinner skin and blood vessel walls with age -educated patient that some skin rashes such as eczema can bruise over as well -recommend to keep the skin very well moisturized and use compression sleeves with activity -If there is concern for continous  bleeding, there would be a role to connect with her cardiologist to discuss whether she should continue aspirin  or not -discussed that if there is concern for continued bleeding, there would be a role to check Von Willebrand levels  -I discussed that there would need to be a risk vs benefits discussion between her cardiologist and nephrologist in regards to considerations of dialysis with her heart valve issues.  -No evidence of polycythemia or thrombocytosis related to her JAK2 mutation at this time. -No indication for starting hydroxyurea or any other treatment for the patient's JAK2 positive MPN at this time -will plan to repeat labs in a few months  FOLLOW-UP: RTC with Dr Salomon Cree with labs in 3 months  The total time spent in the appointment was 30 minutes* .  All of the patient's questions were answered with apparent satisfaction. The patient knows to call the clinic with any problems, questions or  concerns.   Jacquelyn Matt MD MS AAHIVMS [facility] Hematology/Oncology Physician [facility]  .*Total Encounter Time as defined by the Centers for Medicare and Medicaid Services includes, in addition to  the face-to-face time of a patient visit (documented in the note above) non-face-to-face time: obtaining and reviewing outside history, ordering and reviewing medications, tests or procedures, care coordination (communications with other health care professionals or caregivers) and documentation in the medical record.    I,Mitra Faeizi,acting as a Neurosurgeon for Jacquelyn Matt, MD.,have documented all relevant documentation on the behalf of Jacquelyn Matt, MD,as directed by  Jacquelyn Matt, MD while in the presence of Jacquelyn Matt, MD.  .I have reviewed the above documentation for accuracy and completeness, and I agree with the above. .Salem Lembke Kishore Lajuanda Penick MD

## 2023-06-17 ENCOUNTER — Encounter (HOSPITAL_COMMUNITY): Payer: Self-pay

## 2023-06-24 ENCOUNTER — Telehealth: Payer: Self-pay | Admitting: Hematology

## 2023-06-24 NOTE — Telephone Encounter (Signed)
 Spoke with patient confirming upcoming appointment

## 2023-06-25 DIAGNOSIS — R569 Unspecified convulsions: Secondary | ICD-10-CM | POA: Diagnosis not present

## 2023-06-25 DIAGNOSIS — E039 Hypothyroidism, unspecified: Secondary | ICD-10-CM | POA: Diagnosis not present

## 2023-06-25 DIAGNOSIS — I5032 Chronic diastolic (congestive) heart failure: Secondary | ICD-10-CM | POA: Diagnosis not present

## 2023-06-25 DIAGNOSIS — K219 Gastro-esophageal reflux disease without esophagitis: Secondary | ICD-10-CM | POA: Diagnosis not present

## 2023-07-14 DIAGNOSIS — R0602 Shortness of breath: Secondary | ICD-10-CM | POA: Diagnosis not present

## 2023-07-17 DIAGNOSIS — K219 Gastro-esophageal reflux disease without esophagitis: Secondary | ICD-10-CM | POA: Diagnosis not present

## 2023-07-17 DIAGNOSIS — E039 Hypothyroidism, unspecified: Secondary | ICD-10-CM | POA: Diagnosis not present

## 2023-07-17 DIAGNOSIS — I5032 Chronic diastolic (congestive) heart failure: Secondary | ICD-10-CM | POA: Diagnosis not present

## 2023-07-24 DIAGNOSIS — N184 Chronic kidney disease, stage 4 (severe): Secondary | ICD-10-CM | POA: Diagnosis not present

## 2023-07-24 DIAGNOSIS — I5032 Chronic diastolic (congestive) heart failure: Secondary | ICD-10-CM | POA: Diagnosis not present

## 2023-07-24 DIAGNOSIS — I350 Nonrheumatic aortic (valve) stenosis: Secondary | ICD-10-CM | POA: Diagnosis not present

## 2023-07-24 DIAGNOSIS — J9620 Acute and chronic respiratory failure, unspecified whether with hypoxia or hypercapnia: Secondary | ICD-10-CM | POA: Diagnosis not present

## 2023-08-13 DIAGNOSIS — K219 Gastro-esophageal reflux disease without esophagitis: Secondary | ICD-10-CM | POA: Diagnosis not present

## 2023-08-13 DIAGNOSIS — I5022 Chronic systolic (congestive) heart failure: Secondary | ICD-10-CM | POA: Diagnosis not present

## 2023-08-13 DIAGNOSIS — Z9981 Dependence on supplemental oxygen: Secondary | ICD-10-CM | POA: Diagnosis not present

## 2023-08-13 DIAGNOSIS — I10 Essential (primary) hypertension: Secondary | ICD-10-CM | POA: Diagnosis not present

## 2023-09-19 DEATH — deceased

## 2023-09-24 ENCOUNTER — Inpatient Hospital Stay: Admitting: Hematology

## 2023-09-24 ENCOUNTER — Inpatient Hospital Stay
